# Patient Record
Sex: Female | Born: 1935 | Race: White | Hispanic: No | Smoking: Never smoker
Health system: Southern US, Community
[De-identification: ages and names within clinical notes are randomized; demographics above are authoritative.]

## PROBLEM LIST (undated history)

## (undated) DIAGNOSIS — G301 Alzheimer's disease with late onset: Secondary | ICD-10-CM

## (undated) DIAGNOSIS — K5790 Diverticulosis of intestine, part unspecified, without perforation or abscess without bleeding: Secondary | ICD-10-CM

## (undated) DIAGNOSIS — Z87828 Personal history of other (healed) physical injury and trauma: Secondary | ICD-10-CM

## (undated) DIAGNOSIS — N3281 Overactive bladder: Secondary | ICD-10-CM

## (undated) DIAGNOSIS — I671 Cerebral aneurysm, nonruptured: Secondary | ICD-10-CM

## (undated) DIAGNOSIS — C4401 Basal cell carcinoma of skin of lip: Secondary | ICD-10-CM

## (undated) DIAGNOSIS — E039 Hypothyroidism, unspecified: Secondary | ICD-10-CM

## (undated) DIAGNOSIS — M51369 Other intervertebral disc degeneration, lumbar region without mention of lumbar back pain or lower extremity pain: Secondary | ICD-10-CM

## (undated) DIAGNOSIS — E785 Hyperlipidemia, unspecified: Secondary | ICD-10-CM

## (undated) DIAGNOSIS — E119 Type 2 diabetes mellitus without complications: Secondary | ICD-10-CM

## (undated) DIAGNOSIS — I509 Heart failure, unspecified: Secondary | ICD-10-CM

## (undated) DIAGNOSIS — I499 Cardiac arrhythmia, unspecified: Secondary | ICD-10-CM

## (undated) DIAGNOSIS — J302 Other seasonal allergic rhinitis: Secondary | ICD-10-CM

## (undated) DIAGNOSIS — K219 Gastro-esophageal reflux disease without esophagitis: Secondary | ICD-10-CM

## (undated) DIAGNOSIS — I10 Essential (primary) hypertension: Secondary | ICD-10-CM

## (undated) DIAGNOSIS — G4730 Sleep apnea, unspecified: Secondary | ICD-10-CM

## (undated) DIAGNOSIS — I739 Peripheral vascular disease, unspecified: Secondary | ICD-10-CM

## (undated) DIAGNOSIS — I4892 Unspecified atrial flutter: Secondary | ICD-10-CM

## (undated) DIAGNOSIS — I272 Pulmonary hypertension, unspecified: Secondary | ICD-10-CM

## (undated) DIAGNOSIS — C44519 Basal cell carcinoma of skin of other part of trunk: Secondary | ICD-10-CM

## (undated) DIAGNOSIS — F0281 Dementia in other diseases classified elsewhere with behavioral disturbance: Secondary | ICD-10-CM

## (undated) DIAGNOSIS — M5136 Other intervertebral disc degeneration, lumbar region: Secondary | ICD-10-CM

## (undated) DIAGNOSIS — F02818 Dementia in other diseases classified elsewhere, unspecified severity, with other behavioral disturbance: Secondary | ICD-10-CM

## (undated) DIAGNOSIS — F341 Dysthymic disorder: Secondary | ICD-10-CM

## (undated) HISTORY — DX: Basal cell carcinoma of skin of lip: C44.01

## (undated) HISTORY — DX: Personal history of other (healed) physical injury and trauma: Z87.828

## (undated) HISTORY — DX: Gastro-esophageal reflux disease without esophagitis: K21.9

## (undated) HISTORY — DX: Overactive bladder: N32.81

## (undated) HISTORY — DX: Other intervertebral disc degeneration, lumbar region without mention of lumbar back pain or lower extremity pain: M51.369

## (undated) HISTORY — DX: Hyperlipidemia, unspecified: E78.5

## (undated) HISTORY — DX: Basal cell carcinoma of skin of other part of trunk: C44.519

## (undated) HISTORY — DX: Diverticulosis of intestine, part unspecified, without perforation or abscess without bleeding: K57.90

## (undated) HISTORY — PX: BASAL CELL CARCINOMA EXCISION: SHX1214

## (undated) HISTORY — DX: Other seasonal allergic rhinitis: J30.2

## (undated) HISTORY — DX: Type 2 diabetes mellitus without complications: E11.9

## (undated) HISTORY — DX: Pulmonary hypertension, unspecified: I27.20

## (undated) HISTORY — DX: Essential (primary) hypertension: I10

## (undated) HISTORY — DX: Cerebral aneurysm, nonruptured: I67.1

## (undated) HISTORY — DX: Dysthymic disorder: F34.1

## (undated) HISTORY — DX: Heart failure, unspecified: I50.9

## (undated) HISTORY — DX: Other intervertebral disc degeneration, lumbar region: M51.36

## (undated) HISTORY — DX: Unspecified atrial flutter: I48.92

## (undated) HISTORY — DX: Cardiac arrhythmia, unspecified: I49.9

---
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
## 1984-12-24 HISTORY — PX: TOTAL ABDOMINAL HYSTERECTOMY: SHX209

## 2004-12-24 HISTORY — PX: CATARACT EXTRACTION, BILATERAL: SHX1313

## 2012-12-24 HISTORY — PX: COLONOSCOPY: SHX5424

## 2013-04-29 DIAGNOSIS — I671 Cerebral aneurysm, nonruptured: Secondary | ICD-10-CM | POA: Insufficient documentation

## 2013-12-24 HISTORY — PX: CARDIOVASCULAR STRESS TEST: SHX262

## 2015-05-25 HISTORY — PX: INJECTION KNEE: SHX2446

## 2015-11-01 DIAGNOSIS — E785 Hyperlipidemia, unspecified: Secondary | ICD-10-CM | POA: Insufficient documentation

## 2015-11-01 DIAGNOSIS — E1169 Type 2 diabetes mellitus with other specified complication: Secondary | ICD-10-CM | POA: Insufficient documentation

## 2015-11-01 DIAGNOSIS — E781 Pure hyperglyceridemia: Secondary | ICD-10-CM | POA: Insufficient documentation

## 2015-11-01 DIAGNOSIS — E782 Mixed hyperlipidemia: Secondary | ICD-10-CM | POA: Insufficient documentation

## 2016-09-06 HISTORY — PX: REPLACEMENT TOTAL KNEE: SUR1224

## 2018-01-24 DIAGNOSIS — I4892 Unspecified atrial flutter: Secondary | ICD-10-CM

## 2018-01-24 HISTORY — DX: Unspecified atrial flutter: I48.92

## 2018-06-13 ENCOUNTER — Encounter
Admission: RE | Admit: 2018-06-13 | Discharge: 2018-06-13 | Disposition: A | Discharge status: Home / Self Care | Payer: Self-pay | Source: Ambulatory Visit | Attending: Internal Medicine | Admitting: Internal Medicine

## 2018-06-17 ENCOUNTER — Other Ambulatory Visit
Admission: RE | Admit: 2018-06-17 | Discharge: 2018-06-17 | Disposition: A | Discharge status: Home / Self Care | Payer: Medicare Other | Source: Ambulatory Visit | Attending: Gerontology | Admitting: Gerontology

## 2018-06-17 ENCOUNTER — Other Ambulatory Visit: Payer: Self-pay

## 2018-06-17 DIAGNOSIS — I4892 Unspecified atrial flutter: Secondary | ICD-10-CM | POA: Insufficient documentation

## 2018-06-17 LAB — TSH: TSH: 2.749 u[IU]/mL (ref 0.350–4.500)

## 2018-06-17 LAB — CBC WITH DIFFERENTIAL/PLATELET
BASOS ABS: 0.1 10*3/uL (ref 0–0.1)
BASOS PCT: 1 %
EOS ABS: 0.2 10*3/uL (ref 0–0.7)
EOS PCT: 4 %
HCT: 34.9 % — ABNORMAL LOW (ref 35.0–47.0)
Hemoglobin: 12 g/dL (ref 12.0–16.0)
Lymphocytes Relative: 31 %
Lymphs Abs: 2 10*3/uL (ref 1.0–3.6)
MCH: 31 pg (ref 26.0–34.0)
MCHC: 34.5 g/dL (ref 32.0–36.0)
MCV: 90.1 fL (ref 80.0–100.0)
Monocytes Absolute: 0.6 10*3/uL (ref 0.2–0.9)
Monocytes Relative: 9 %
Neutro Abs: 3.7 10*3/uL (ref 1.4–6.5)
Neutrophils Relative %: 55 %
PLATELETS: 292 10*3/uL (ref 150–440)
RBC: 3.87 MIL/uL (ref 3.80–5.20)
RDW: 14.9 % — AB (ref 11.5–14.5)
WBC: 6.6 10*3/uL (ref 3.6–11.0)

## 2018-06-17 LAB — LIPID PANEL
CHOL/HDL RATIO: 2.8 ratio
CHOLESTEROL: 103 mg/dL (ref 0–200)
HDL: 37 mg/dL — ABNORMAL LOW (ref >40)
LDL CALC: 45 mg/dL (ref 0–99)
TRIGLYCERIDES: 107 mg/dL (ref <150)
VLDL: 21 mg/dL (ref 0–40)

## 2018-06-17 LAB — COMPREHENSIVE METABOLIC PANEL
ALT: 16 U/L (ref 0–44)
AST: 17 U/L (ref 15–41)
Albumin: 3.6 g/dL (ref 3.5–5.0)
Alkaline Phosphatase: 63 U/L (ref 38–126)
Anion gap: 7 (ref 5–15)
BUN: 9 mg/dL (ref 8–23)
CHLORIDE: 109 mmol/L (ref 98–111)
CO2: 24 mmol/L (ref 22–32)
Calcium: 8.8 mg/dL — ABNORMAL LOW (ref 8.9–10.3)
Creatinine, Ser: 0.58 mg/dL (ref 0.44–1.00)
GFR calc Af Amer: >60 mL/min (ref >60)
Glucose, Bld: 102 mg/dL — ABNORMAL HIGH (ref 70–99)
POTASSIUM: 3.8 mmol/L (ref 3.5–5.1)
SODIUM: 140 mmol/L (ref 135–145)
Total Bilirubin: 0.6 mg/dL (ref 0.3–1.2)
Total Protein: 6.3 g/dL — ABNORMAL LOW (ref 6.5–8.1)

## 2018-06-17 LAB — DIGOXIN LEVEL: Digoxin Level: 0.6 ng/mL — ABNORMAL LOW (ref 0.8–2.0)

## 2018-06-17 LAB — VITAMIN B12: Vitamin B-12: 414 pg/mL (ref 180–914)

## 2018-06-17 LAB — MAGNESIUM: Magnesium: 1.8 mg/dL (ref 1.7–2.4)

## 2018-06-17 MED ORDER — LORAZEPAM 0.5 MG PO TABS: 0.5000 mg | ORAL_TABLET | ORAL | 0 refills | Status: DC | PRN

## 2018-06-17 NOTE — Telephone Encounter (Signed)
Rx sent to [facility] phone : [phone] , fax : [phone]  

## 2018-06-18 DIAGNOSIS — M150 Primary generalized (osteo)arthritis: Secondary | ICD-10-CM

## 2018-06-18 DIAGNOSIS — M159 Polyosteoarthritis, unspecified: Secondary | ICD-10-CM | POA: Insufficient documentation

## 2018-06-18 DIAGNOSIS — F325 Major depressive disorder, single episode, in full remission: Secondary | ICD-10-CM | POA: Insufficient documentation

## 2018-06-18 LAB — VITAMIN D 25 HYDROXY (VIT D DEFICIENCY, FRACTURES): VIT D 25 HYDROXY: 36 ng/mL (ref 30.0–100.0)

## 2018-06-19 ENCOUNTER — Non-Acute Institutional Stay (SKILLED_NURSING_FACILITY): Payer: Medicare Other | Admitting: Gerontology

## 2018-06-19 ENCOUNTER — Encounter: Payer: Self-pay | Admitting: Gerontology

## 2018-06-19 DIAGNOSIS — I480 Paroxysmal atrial fibrillation: Secondary | ICD-10-CM | POA: Diagnosis not present

## 2018-06-19 DIAGNOSIS — F419 Anxiety disorder, unspecified: Secondary | ICD-10-CM | POA: Diagnosis not present

## 2018-06-19 DIAGNOSIS — E538 Deficiency of other specified B group vitamins: Secondary | ICD-10-CM | POA: Diagnosis not present

## 2018-06-19 DIAGNOSIS — I10 Essential (primary) hypertension: Secondary | ICD-10-CM

## 2018-06-19 DIAGNOSIS — E1140 Type 2 diabetes mellitus with diabetic neuropathy, unspecified: Secondary | ICD-10-CM | POA: Insufficient documentation

## 2018-06-19 DIAGNOSIS — M25569 Pain in unspecified knee: Secondary | ICD-10-CM | POA: Insufficient documentation

## 2018-06-19 DIAGNOSIS — R262 Difficulty in walking, not elsewhere classified: Secondary | ICD-10-CM

## 2018-06-19 DIAGNOSIS — I4892 Unspecified atrial flutter: Secondary | ICD-10-CM | POA: Diagnosis not present

## 2018-06-19 DIAGNOSIS — E7800 Pure hypercholesterolemia, unspecified: Secondary | ICD-10-CM | POA: Diagnosis not present

## 2018-06-19 DIAGNOSIS — F320 Major depressive disorder, single episode, mild: Secondary | ICD-10-CM | POA: Diagnosis not present

## 2018-06-19 DIAGNOSIS — E039 Hypothyroidism, unspecified: Secondary | ICD-10-CM

## 2018-06-19 DIAGNOSIS — G4700 Insomnia, unspecified: Secondary | ICD-10-CM | POA: Diagnosis not present

## 2018-06-19 DIAGNOSIS — M1711 Unilateral primary osteoarthritis, right knee: Secondary | ICD-10-CM

## 2018-06-19 DIAGNOSIS — N3281 Overactive bladder: Secondary | ICD-10-CM | POA: Insufficient documentation

## 2018-06-19 DIAGNOSIS — E1159 Type 2 diabetes mellitus with other circulatory complications: Secondary | ICD-10-CM | POA: Insufficient documentation

## 2018-06-19 DIAGNOSIS — E46 Unspecified protein-calorie malnutrition: Secondary | ICD-10-CM | POA: Insufficient documentation

## 2018-06-19 DIAGNOSIS — M6281 Muscle weakness (generalized): Secondary | ICD-10-CM

## 2018-06-19 DIAGNOSIS — G3184 Mild cognitive impairment, so stated: Secondary | ICD-10-CM | POA: Insufficient documentation

## 2018-06-19 NOTE — Progress Notes (Signed)
Location:   [facility] Room Number: 240X Place of Service:  SNF [phone])  Provider: Toni Arthurs, NP-C  PCP: No primary care provider on file. No care team member to display  No emergency contact information on file.  Code Status: DNR Goals of care:  Advanced Directive information Advanced Directives 06/19/2018  Does Patient Have a Medical Advance Directive? Yes  Type of Advance Directive Out of facility DNR (pink MOST or yellow form);Healthcare Power of Attorney  Does patient want to make changes to medical advance directive? No - Patient declined  Copy of [place] in Chart? No - copy requested     Allergies  Allergen Reactions  . Oxytocin     Chief Complaint  Patient presents with  . Discharge Note    Discharged from SNF    HPI:  84 y.o. female seen today for discharge evaluation. Pt was only admitted to the facility 4 days ago. Pt just moved to [place] from [facility] and is still in the process of moving into the [facility]. Pt admits to not sleeping well and worrying about her husband who is also a resident here on the SN unit. She has been trying to pack/move, etc. And is under a significant amount of stress and strain. Pt developed aflutter with RVR and went to the ED in [place]. Symptoms were managed there and was advised to follow up with PCP. Pt is also having muscle weakness and difficulty walking. She has a known right knee effusion, causing significant amount of pain. She has not yet seen an orthopedist about this. She was seen by Dr Ouida Sills yesterday and started on Voltaren Gel until she can be seen by an Orthopedist, with prn Tylenol for pain. She reports an ongoing/ persistent history of anxiety and depression, that has been exacerbated by the recent move and placement of her husband in [place]. Baseline labs were obtained. She was found to be low in B12 and low cholesterol. Adjustments made to statin therapy. Supplementation  started for B12. She was also mildly deficient in protein and albumin. Supplementation started for this, as well. Pt is already on appropriate treatment for cognitive impairment, hypothyroidism, OAB, HTN, DM with neuropathy. Pt stated she did not check her BGs at home and did not have a meter. She did request a prescription for a meter. She was admitted on Eliquis for the a-fib. Effective adjustments made to her medication regimen for anxiety, depression, hypercholesterolemia, aflutter/ persistent tachycardia (noted to be worse in the AM). Pt was educated at length about these changes and the importance of establishing a PCP, Cardiologist and Orthopedist. Pt verbalized understanding and agreement with plan. We discussed home monitoring of blood glucose and the need to potentially adjust the Metformin if BGs continued to run high. Adjustments to the Metformin were not made at this time d/t possible effect of the stress on her BGs- making them more elevated than normal. Pt verbalized the importance of monitoring BGs and close f/u with PCP for medication adjustment of Metformin, etc. In general, pt reports she is feeling better and is ready to discharge home to her apartment. Pt expressed her appreciation for the care and medication adjustments, etc. She reports her appetite is starting to improve, voiding well  Having regular BMs. VSS. No other complaints.     Past Medical History:  Diagnosis Date  . Arrhythmia   . Atrial flutter ([place]) 01/2018   new onset   . Basal cell carcinoma  of back   . Basal cell carcinoma of lip   . Cerebral aneurysm    followed by [facility]  . DDD (degenerative disc disease), lumbar    superior plate depression, L3 08/18/2014  . Diabetes mellitus type II, controlled ([place])   . Diverticulosis   . Dysthymia    depression  . GERD (gastroesophageal reflux disease)   . History of meniscal tear   . Hyperlipidemia   . Hypertension   . Mild pulmonary hypertension ([place])   . Overactive  bladder   . Seasonal allergic rhinitis     Past Surgical History:  Procedure Laterality Date  . APPENDECTOMY  1946  . BASAL CELL CARCINOMA EXCISION     2006 and 2009 removed from back and lip  . CARDIOVASCULAR STRESS TEST  2015   nuclear cardiac stress test negative for ischemia - Dr. Satira Sark  . CATARACT EXTRACTION, BILATERAL  2006  . COLONOSCOPY  2014  . [place]  . INJECTION KNEE Left 05/2015  . REPLACEMENT TOTAL KNEE Left 09/06/2016   Dr. Barnet Pall  . TONSILLECTOMY AND ADENOIDECTOMY  1953  . TOTAL ABDOMINAL HYSTERECTOMY  1986   [facility]      reports that she has never smoked. She has never used smokeless tobacco. She reports that she drank alcohol. Her drug history is not on file. Social History   Socioeconomic History  . Marital status: Married    Spouse name: Not on file  . Number of children: 5  . Years of education: Not on file  . Highest education level: Not on file  Occupational History  . Not on file  Social Needs  . Financial resource strain: Not on file  . Food insecurity:    Worry: Not on file    Inability: Not on file  . Transportation needs:    Medical: Not on file    Non-medical: Not on file  Tobacco Use  . Smoking status: Never Smoker  . Smokeless tobacco: Never Used  Substance and Sexual Activity  . Alcohol use: Not Currently  . Drug use: Not on file  . Sexual activity: Not on file  Lifestyle  . Physical activity:    Days per week: Not on file    Minutes per session: Not on file  . Stress: Not on file  Relationships  . Social connections:    Talks on phone: Not on file    Gets together: Not on file    Attends religious service: Not on file    Active member of club or organization: Not on file    Attends meetings of clubs or organizations: Not on file    Relationship status: Not on file  . Intimate partner violence:    Fear of current or ex partner: Not on file    Emotionally abused: Not on file    Physically abused: Not  on file    Forced sexual activity: Not on file  Other Topics Concern  . Not on file  Social History Narrative  . Not on file   Functional Status Survey:    Allergies  Allergen Reactions  . Oxytocin     Pertinent  Health Maintenance Due  Topic Date Due  . DEXA SCAN  03/01/2000  . PNA vac Low Risk Adult (1 of 2 - PCV13) 03/01/2000  . INFLUENZA VACCINE  07/24/2018    Medications: Allergies as of 06/19/2018      Reactions   Oxytocin  Medication List        Accurate as of 06/19/18  3:57 PM. Always use your most recent med list.          acetaminophen 500 MG tablet Commonly known as:  TYLENOL Take 500 mg by mouth every 6 (six) hours as needed.   apixaban 5 MG Tabs tablet Commonly known as:  ELIQUIS Take 5 mg by mouth 2 (two) times daily.   atorvastatin 10 MG tablet Commonly known as:  LIPITOR Take 10 mg by mouth daily.   cholecalciferol 1000 units tablet Commonly known as:  VITAMIN D Take 1,000 Units by mouth daily.   cyanocobalamin 1000 MCG tablet Take 1,000 mcg by mouth daily.   Diclofenac Sodium 3 % Gel Place 4 g onto the skin 3 (three) times daily. Right knee   digoxin 0.125 MG tablet Commonly known as:  LANOXIN Take 0.125 mg by mouth daily. for heart rhythm issues and heart failure **call MD or NP if heart rate is less than 60**   diltiazem 180 MG 24 hr tablet Commonly known as:  CARDIZEM LA Take 180 mg by mouth daily.   donepezil 10 MG tablet Commonly known as:  ARICEPT Take 10 mg by mouth at bedtime.   feeding supplement (PRO-STAT SUGAR FREE 64) Liqd Take 30 mLs by mouth daily.   gabapentin 100 MG capsule Commonly known as:  NEURONTIN Take 100 mg by mouth at bedtime.   levothyroxine 50 MCG tablet Commonly known as:  SYNTHROID, LEVOTHROID Take 50 mcg by mouth daily before breakfast.   LORazepam 0.5 MG tablet Commonly known as:  ATIVAN Take 0.5 mg by mouth at bedtime.   LORazepam 0.5 MG tablet Commonly known as:  ATIVAN Take 1  tablet (0.5 mg total) by mouth every 4 (four) hours as needed for anxiety or sleep.   losartan 50 MG tablet Commonly known as:  COZAAR Take 50 mg by mouth daily.   metFORMIN 500 MG tablet Commonly known as:  GLUCOPHAGE Take 500 mg by mouth 2 (two) times daily with a meal.   metoprolol succinate 100 MG 24 hr tablet Commonly known as:  TOPROL-XL Take 150 mg by mouth daily. 1 and 1/2 tabs Take with or immediately following a meal.   metoprolol tartrate 25 MG tablet Commonly known as:  LOPRESSOR Take 25 mg by mouth every 8 (eight) hours as needed. tachycardia- heart rate >110   oxybutynin 5 MG tablet Commonly known as:  DITROPAN Take 5 mg by mouth 2 (two) times daily.   potassium chloride 10 MEQ tablet Commonly known as:  K-DUR Take 10 mEq by mouth daily.   ranitidine 150 MG tablet Commonly known as:  ZANTAC Take 150 mg by mouth 2 (two) times daily.   valACYclovir 1000 MG tablet Commonly known as:  VALTREX Take 1,000 mg by mouth 2 (two) times daily as needed.   venlafaxine XR 150 MG 24 hr capsule Commonly known as:  EFFEXOR-XR Take 150 mg by mouth daily with breakfast.       Review of Systems  Constitutional: Positive for fatigue. Negative for activity change, appetite change, chills, diaphoresis and fever.  HENT: Negative for congestion, mouth sores, nosebleeds, postnasal drip, sneezing, sore throat, trouble swallowing and voice change.   Respiratory: Negative for apnea, cough, choking, chest tightness, shortness of breath and wheezing.   Cardiovascular: Negative for chest pain, palpitations and leg swelling.  Gastrointestinal: Negative for abdominal distention, abdominal pain, constipation, diarrhea and nausea.  Genitourinary: Negative for difficulty urinating, dysuria,  frequency and urgency.  Musculoskeletal: Positive for gait problem. Negative for back pain and myalgias. Arthralgias: typical arthritis. Joint swelling: ambulates with walker.  Skin: Negative for color  change, pallor, rash and wound.  Neurological: Positive for weakness. Negative for dizziness, tremors, syncope, speech difficulty, numbness and headaches.  Psychiatric/Behavioral: Negative for agitation and behavioral problems. The patient is nervous/anxious.   All other systems reviewed and are negative.   Vitals:   06/19/18 1454  BP: (!) 97/59  Pulse: 93  Resp: 18  Temp: (!) 97.3 F (36.3 C)  TempSrc: Oral  SpO2: 95%  Weight: 152 lb (68.9 kg)  Height: 5\' 5"  (1.651 m)   Body mass index is 25.29 kg/m. Physical Exam  Constitutional: She is oriented to person, place, and time. Vital signs are normal. She appears well-developed and well-nourished. She is active and cooperative. She does not appear ill. No distress.  HENT:  Head: Normocephalic and atraumatic.  Mouth/Throat: Uvula is midline, oropharynx is clear and moist and mucous membranes are normal. Mucous membranes are not pale, not dry and not cyanotic.  Eyes: Pupils are equal, round, and reactive to light. Conjunctivae, EOM and lids are normal.  Neck: Trachea normal, normal range of motion and full passive range of motion without pain. Neck supple. No JVD present. No tracheal deviation, no edema and no erythema present. No thyromegaly present.  Cardiovascular: Normal rate, normal heart sounds, intact distal pulses and normal pulses. An irregular rhythm present. Exam reveals no gallop, no distant heart sounds and no friction rub.  No murmur heard. Pulses:      Dorsalis pedis pulses are 2+ on the right side, and 2+ on the left side.  No edema  Pulmonary/Chest: Effort normal and breath sounds normal. No accessory muscle usage. No respiratory distress. She has no decreased breath sounds. She has no wheezes. She has no rhonchi. She has no rales. She exhibits no tenderness.  Abdominal: Soft. Normal appearance and bowel sounds are normal. She exhibits no distension and no ascites. There is no tenderness.  Musculoskeletal: Normal range  of motion. She exhibits no edema or tenderness.  Expected osteoarthritis, stiffness; Bilateral Calves soft, supple. Negative Homan's Sign. B- pedal pulses equal; generalized weakness and deconditioning  Neurological: She is alert and oriented to person, place, and time. She has normal strength.  Skin: Skin is warm, dry and intact. She is not diaphoretic. No cyanosis. No pallor. Nails show no clubbing.  Psychiatric: She has a normal mood and affect. Her speech is normal and behavior is normal. Judgment and thought content normal. Cognition and memory are normal.  Nursing note and vitals reviewed.   Labs reviewed: Basic Metabolic Panel: Recent Labs    06/17/18 0600  NA 140  K 3.8  CL 109  CO2 24  GLUCOSE 102*  BUN 9  CREATININE 0.58  CALCIUM 8.8*  MG 1.8   Liver Function Tests: Recent Labs    06/17/18 0600  AST 17  ALT 16  ALKPHOS 63  BILITOT 0.6  PROT 6.3*  ALBUMIN 3.6   No results for input(s): LIPASE, AMYLASE in the last 8760 hours. No results for input(s): AMMONIA in the last 8760 hours. CBC: Recent Labs    06/17/18 0600  WBC 6.6  NEUTROABS 3.7  HGB 12.0  HCT 34.9*  MCV 90.1  PLT 292   Cardiac Enzymes: No results for input(s): CKTOTAL, CKMB, CKMBINDEX, TROPONINI in the last 8760 hours. BNP: Invalid input(s): POCBNP CBG: No results for input(s): GLUCAP in the last  8760 hours.  Procedures and Imaging Studies During Stay: No results found.  Assessment/Plan:    Atrial flutter, unspecified type (HCC)  Paroxysmal atrial fibrillation (HCC)  Depression, major, single episode, mild (HCC)  Anxiety disorder, unspecified type  Insomnia, unspecified type  Pure hypercholesterolemia  Vitamin B12 deficiency  Protein-calorie malnutrition, unspecified severity ([place])  Type 2 diabetes mellitus with diabetic neuropathy, without long-term current use of insulin (HCC)  Cognitive impairment, mild, so stated  Osteoarthritis of right knee, unspecified  osteoarthritis type  Benign essential hypertension  Hypothyroidism, unspecified type  Overactive bladder  Generalized muscle weakness  Difficulty in walking, not elsewhere classified   Continue working with PT/OT  Continue exercises as taught by PT/OT  Continue current medication regimen, except  DC previous dose of Atorvastatin  Now Atorvastatin 10 mg po Q Day  Pro-stat 30 mL po Q Day (or equivalent protein powder/ supplement)  Continue Eliquis 5 mg po BID  Add Cyanocobalamin 1,000 mcg po Q Day  Continue Diclofenac 1% gel- apply to the right knee TID for pain  Follow up with Orthopedist as soon as possible  Continue Cardizem LA 180 mg po Q Day  Continue lanoxin 0.125 mg po Q Day  Continue Aricpet 10 mg po Q HS  Continue Neurontin 100 mg po QHS  Continue Synthroid 50 mcg po Q Day  DC Trizolam  Add Ativan 0.5 mg po Q HS for sleep and Q 6 hours prn anxiety  DC current Effexor regimen  Effexor XR 150 mg po Q Day  Continue Cozaar 50 mg po Q Day  Continue Metformin 500 mg po Q Day  DC Mobic  DX Metoprolol XR 100 mg po Q Day  Change to Metoprolol XR 100 mg tabs 1 1/2 tablets po Q HS (d/t early am tachycardia)  Metoprolol tartrate 25 g po TID prn HR >110  Continue oxybutynin 5 mg po BID  Continue Xantac 150 mg po BID  RX written for CBG meter  Establish and f/u with PCP asap  Establish and f/u with Cardiology asap  Establish and f/u with Orthopedist asap  Monitor for bleeding, abdominal pain, etc   Patient is being discharged with the following home health services: HHPT/OT [facility]   Patient is being discharged with the following durable medical equipment: none- already has walker and rollator   Patient has been advised to f/u with their PCP in 1-2 weeks to bring them up to date on their rehab stay.  Social services at facility was responsible for arranging this appointment.  Pt was provided with a 30 day supply of  prescriptions for medications and refills must be obtained from their PCP.  For controlled substances, a more limited supply may be provided adequate until PCP appointment only.  Future labs/tests needed:   Family/ staff Communication:   Total Time:  Documentation: 15 minutes  Face to Face: 30 minutes  Family/Phone:  Vikki Ports, NP-C [facility] [address], [place] [address] Cell Phone (Mon-Fri 8am-5pm):  [phone] On Call:  [phone] & follow prompts after 5pm & weekends Office Phone:  [phone] Office Fax:  [phone]

## 2018-06-23 ENCOUNTER — Encounter: Admission: RE | Admit: 2018-06-23 | Payer: Self-pay | Source: Ambulatory Visit | Admitting: Internal Medicine

## 2018-08-06 ENCOUNTER — Telehealth: Payer: Self-pay | Admitting: Cardiovascular Disease

## 2018-08-06 NOTE — Telephone Encounter (Signed)
Patient stated that she would like to cancel her appointment on 08/18/18 with Dr. Rockey Situ as a new patient. She stated that she will be seeing another cardiologist tomorrow. Appointment has been cancelled.

## 2018-08-06 NOTE — Telephone Encounter (Signed)
New Message  Pt verbalized someone called her from this office but I do not see any notes.  Please f/u

## 2018-08-18 ENCOUNTER — Ambulatory Visit: Payer: Self-pay | Admitting: Cardiovascular Disease

## 2018-08-18 ENCOUNTER — Encounter

## 2018-09-08 DIAGNOSIS — E1169 Type 2 diabetes mellitus with other specified complication: Secondary | ICD-10-CM | POA: Insufficient documentation

## 2018-09-08 DIAGNOSIS — G609 Hereditary and idiopathic neuropathy, unspecified: Secondary | ICD-10-CM | POA: Insufficient documentation

## 2018-09-08 DIAGNOSIS — E782 Mixed hyperlipidemia: Secondary | ICD-10-CM | POA: Insufficient documentation

## 2018-09-09 ENCOUNTER — Encounter: Payer: Self-pay | Admitting: Emergency Medicine

## 2018-09-09 ENCOUNTER — Other Ambulatory Visit: Payer: Self-pay

## 2018-09-09 ENCOUNTER — Emergency Department
Admission: EM | Admit: 2018-09-09 | Discharge: 2018-09-10 | Disposition: A | Discharge status: Home / Self Care | Payer: Medicare Other | Attending: Emergency Medicine | Admitting: Emergency Medicine

## 2018-09-09 DIAGNOSIS — G4700 Insomnia, unspecified: Secondary | ICD-10-CM | POA: Diagnosis not present

## 2018-09-09 DIAGNOSIS — E1140 Type 2 diabetes mellitus with diabetic neuropathy, unspecified: Secondary | ICD-10-CM | POA: Diagnosis not present

## 2018-09-09 DIAGNOSIS — I10 Essential (primary) hypertension: Secondary | ICD-10-CM | POA: Insufficient documentation

## 2018-09-09 DIAGNOSIS — R51 Headache: Secondary | ICD-10-CM | POA: Insufficient documentation

## 2018-09-09 DIAGNOSIS — Z7984 Long term (current) use of oral hypoglycemic drugs: Secondary | ICD-10-CM | POA: Insufficient documentation

## 2018-09-09 DIAGNOSIS — Z96652 Presence of left artificial knee joint: Secondary | ICD-10-CM | POA: Insufficient documentation

## 2018-09-09 DIAGNOSIS — Z79899 Other long term (current) drug therapy: Secondary | ICD-10-CM | POA: Diagnosis not present

## 2018-09-09 DIAGNOSIS — I480 Paroxysmal atrial fibrillation: Secondary | ICD-10-CM | POA: Insufficient documentation

## 2018-09-09 DIAGNOSIS — R112 Nausea with vomiting, unspecified: Secondary | ICD-10-CM | POA: Diagnosis not present

## 2018-09-09 DIAGNOSIS — Z85828 Personal history of other malignant neoplasm of skin: Secondary | ICD-10-CM | POA: Diagnosis not present

## 2018-09-09 DIAGNOSIS — R251 Tremor, unspecified: Secondary | ICD-10-CM | POA: Insufficient documentation

## 2018-09-09 DIAGNOSIS — E039 Hypothyroidism, unspecified: Secondary | ICD-10-CM | POA: Insufficient documentation

## 2018-09-09 DIAGNOSIS — R197 Diarrhea, unspecified: Secondary | ICD-10-CM

## 2018-09-09 DIAGNOSIS — R519 Headache, unspecified: Secondary | ICD-10-CM

## 2018-09-09 LAB — BASIC METABOLIC PANEL
Anion gap: 10 (ref 5–15)
BUN: 13 mg/dL (ref 8–23)
CALCIUM: 8.8 mg/dL — AB (ref 8.9–10.3)
CO2: 26 mmol/L (ref 22–32)
CREATININE: 0.69 mg/dL (ref 0.44–1.00)
Chloride: 99 mmol/L (ref 98–111)
Glucose, Bld: 170 mg/dL — ABNORMAL HIGH (ref 70–99)
Potassium: 4.6 mmol/L (ref 3.5–5.1)
SODIUM: 135 mmol/L (ref 135–145)

## 2018-09-09 LAB — CBC
HCT: 45.7 % (ref 35.0–47.0)
Hemoglobin: 16.1 g/dL — ABNORMAL HIGH (ref 12.0–16.0)
MCH: 30.7 pg (ref 26.0–34.0)
MCHC: 35.2 g/dL (ref 32.0–36.0)
MCV: 87.4 fL (ref 80.0–100.0)
PLATELETS: 243 10*3/uL (ref 150–440)
RBC: 5.23 MIL/uL — AB (ref 3.80–5.20)
RDW: 15.9 % — ABNORMAL HIGH (ref 11.5–14.5)
WBC: 9.4 10*3/uL (ref 3.6–11.0)

## 2018-09-09 MED ORDER — PROCHLORPERAZINE EDISYLATE 10 MG/2ML IJ SOLN
10.0000 mg | Freq: Once | INTRAMUSCULAR | Status: AC
  Administered 2018-09-09: 10 mg via INTRAVENOUS
  Filled 2018-09-09: qty 2

## 2018-09-09 MED ORDER — SODIUM CHLORIDE 0.9 % IV BOLUS
1000.0000 mL | Freq: Once | INTRAVENOUS | Status: AC
  Administered 2018-09-09: 1000 mL via INTRAVENOUS

## 2018-09-09 MED ORDER — DIPHENHYDRAMINE HCL 12.5 MG/5ML PO ELIX: 6.2500 mg | ORAL_SOLUTION | Freq: Once | ORAL | Status: DC

## 2018-09-09 MED ORDER — DIPHENHYDRAMINE HCL 50 MG/ML IJ SOLN
INTRAMUSCULAR | Status: AC
  Administered 2018-09-09: 12.5 mg
  Filled 2018-09-09: qty 1

## 2018-09-09 NOTE — ED Provider Notes (Signed)
[facility] Emergency Department Provider Note  ____________________________________________   I have reviewed the triage vital signs and the nursing notes. Where available I have reviewed prior notes and, if possible and indicated, outside hospital notes.    HISTORY  Chief Complaint Nausea; Emesis; Diarrhea; Dizziness; and Tremors    HPI Alison Richardson is a 84 y.o. female  With a history of aneurysm for she gets CT scans and other imaging every year, migraine headaches which are somewhat frequent, paroxysmal atrial fibrillation, poor sleep, anxiety, tremor, states that since he moved up here several months ago to be with her husband in a higher level of care she feels that her life is gone downhill gradually.  She has had tremors that come and go.  She is not having a tremor today.  She has had headaches that come and go, she is having a mild headache today.  Not sudden onset not worst headache of life, consistent with prior migraine.  Patient states that she is taken nothing for it.  She also states she has had some diarrhea over the last couple days nonbloody, no melena no bright red blood per rectum no hematemesis.  She states she feels generally unwell and anxious.  She is having trouble sleeping.  She saw a primary care doctor for most of these complaints yesterday and none of these complaints are new.  Patient is never been evaluated for sleep apnea.  She has been having daily takes for months she states gradually onset.  Come and go.  Not reliably there every day but sometimes they are and sometimes they are not.   Past Medical History:  Diagnosis Date  . Arrhythmia   . Atrial flutter ([place]) 01/2018   new onset   . Basal cell carcinoma of back   . Basal cell carcinoma of lip   . Cerebral aneurysm    followed by [facility]  . DDD (degenerative disc disease), lumbar    superior plate depression, L3 08/18/2014  . Diabetes mellitus type II, controlled ([place])   .  Diverticulosis   . Dysthymia    depression  . GERD (gastroesophageal reflux disease)   . History of meniscal tear   . Hyperlipidemia   . Hypertension   . Mild pulmonary hypertension ([place])   . Overactive bladder   . Seasonal allergic rhinitis     Patient Active Problem List   Diagnosis Date Noted  . Anxiety disorder 06/19/2018  . Depression, major, single episode, mild ([place]) 06/19/2018  . Paroxysmal atrial fibrillation ([place]) 06/19/2018  . Atrial flutter ([place]) 06/19/2018  . Pure hypercholesterolemia 06/19/2018  . Vitamin B12 deficiency 06/19/2018  . Insomnia 06/19/2018  . Protein-calorie malnutrition ([place]) 06/19/2018  . Type 2 diabetes mellitus with diabetic neuropathy ([place]) 06/19/2018  . Knee pain 06/19/2018  . Cognitive impairment, mild, so stated 06/19/2018  . Osteoarthritis of right knee 06/19/2018  . Benign essential hypertension 06/19/2018  . Hypothyroidism 06/19/2018  . Overactive bladder 06/19/2018  . Generalized muscle weakness 06/19/2018  . Difficulty in walking, not elsewhere classified 06/19/2018    Past Surgical History:  Procedure Laterality Date  . APPENDECTOMY  1946  . BASAL CELL CARCINOMA EXCISION     2006 and 2009 removed from back and lip  . CARDIOVASCULAR STRESS TEST  2015   nuclear cardiac stress test negative for ischemia - Dr. Satira Sark  . CATARACT EXTRACTION, BILATERAL  2006  . COLONOSCOPY  2014  . [place]  . INJECTION KNEE Left  05/2015  . REPLACEMENT TOTAL KNEE Left 09/06/2016   Dr. Barnet Pall  . TONSILLECTOMY AND ADENOIDECTOMY  1953  . TOTAL ABDOMINAL HYSTERECTOMY  1986   [facility]    Prior to Admission medications   Medication Sig Start Date End Date Taking? Authorizing Provider  acetaminophen (TYLENOL) 500 MG tablet Take 500 mg by mouth every 6 (six) hours as needed.    [provider]  Amino Acids-Protein Hydrolys (FEEDING SUPPLEMENT, PRO-STAT SUGAR FREE 64,) LIQD Take 30 mLs by mouth daily.    [provider]  apixaban (ELIQUIS) 5 MG TABS tablet Take 5 mg by mouth 2 (two) times daily.    [provider]  atorvastatin (LIPITOR) 10 MG tablet Take 10 mg by mouth daily.    [provider]  cholecalciferol (VITAMIN D) 1000 units tablet Take 1,000 Units by mouth daily.    [provider]  cyanocobalamin 1000 MCG tablet Take 1,000 mcg by mouth daily.    [provider]  Diclofenac Sodium 3 % GEL Place 4 g onto the skin 3 (three) times daily. Right knee    [provider]  digoxin (LANOXIN) 0.125 MG tablet Take 0.125 mg by mouth daily. for heart rhythm issues and heart failure **call MD or NP if heart rate is less than 60**    [provider]  diltiazem (CARDIZEM LA) 180 MG 24 hr tablet Take 180 mg by mouth daily.    [provider]  donepezil (ARICEPT) 10 MG tablet Take 10 mg by mouth at bedtime.    [provider]  gabapentin (NEURONTIN) 100 MG capsule Take 100 mg by mouth at bedtime.    [provider]  levothyroxine (SYNTHROID, LEVOTHROID) 50 MCG tablet Take 50 mcg by mouth daily before breakfast.    [provider]  LORazepam (ATIVAN) 0.5 MG tablet Take 0.5 mg by mouth at bedtime.    [provider]  LORazepam (ATIVAN) 0.5 MG tablet Take 1 tablet (0.5 mg total) by mouth every 4 (four) hours as needed for anxiety or sleep. 06/17/18   Toni Arthurs, NP  losartan (COZAAR) 50 MG tablet Take 50 mg by mouth daily.    [provider]  metFORMIN (GLUCOPHAGE) 500 MG tablet Take 500 mg by mouth 2 (two) times daily with a meal.    [provider]  metoprolol succinate (TOPROL-XL) 100 MG 24 hr tablet Take 150 mg by mouth daily. 1 and 1/2 tabs Take with or immediately following a meal.    [provider]  metoprolol tartrate (LOPRESSOR) 25 MG tablet Take 25 mg by mouth every 8 (eight) hours as needed. tachycardia- heart rate >110    [provider]  oxybutynin (DITROPAN)  5 MG tablet Take 5 mg by mouth 2 (two) times daily.    [provider]  potassium chloride (K-DUR) 10 MEQ tablet Take 10 mEq by mouth daily.    [provider]  ranitidine (ZANTAC) 150 MG tablet Take 150 mg by mouth 2 (two) times daily.    [provider]  valACYclovir (VALTREX) 1000 MG tablet Take 1,000 mg by mouth 2 (two) times daily as needed.    [provider]  venlafaxine XR (EFFEXOR-XR) 150 MG 24 hr capsule Take 150 mg by mouth daily with breakfast.    [provider]    Allergies Oxytocin  Family History  Problem Relation Age of Onset  . Hypertension Mother   . CAD Father     Social History Social  History   Tobacco Use  . Smoking status: Never Smoker  . Smokeless tobacco: Never Used  Substance Use Topics  . Alcohol use: Not Currently  . Drug use: Not on file    Review of Systems Constitutional: No fever/chills Eyes: No visual changes. ENT: No sore throat. No stiff neck no neck pain Cardiovascular: Denies chest pain. Respiratory: Denies shortness of breath. Gastrointestinal:   no vomiting.  No diarrhea.  No constipation. Genitourinary: Negative for dysuria. Musculoskeletal: Negative lower extremity swelling Skin: Negative for rash. Neurological: Negative for severe headaches, focal weakness or numbness.   ____________________________________________   PHYSICAL EXAM:  VITAL SIGNS: ED Triage Vitals [09/09/18 2206]  Enc Vitals Group     BP (!) 179/87     Pulse Rate 60     Resp 18     Temp 98.2 F (36.8 C)     Temp Source Oral     SpO2 97 %     Weight 144 lb (65.3 kg)     Height 5\' 6"  (1.676 m)     Head Circumference      Peak Flow      Pain Score 6     Pain Loc      Pain Edu?      Excl. in [place]?     Constitutional: Alert and oriented. Well appearing and in no acute distress.  Laughing and joking with me, she is anxious however Eyes: Conjunctivae are normal Head: Atraumatic HEENT: No  congestion/rhinnorhea. Mucous membranes are moist.  Oropharynx non-erythematous Neck:   Nontender with no meningismus, no masses, no stridor Cardiovascular: Normal rate, regular rhythm. Grossly normal heart sounds.  Good peripheral circulation. Respiratory: Normal respiratory effort.  No retractions. Lungs CTAB. Abdominal: Soft and nontender. No distention. No guarding no rebound Back:  There is no focal tenderness or step off.  there is no midline tenderness there are no lesions noted. there is no CVA tenderness Musculoskeletal: No lower extremity tenderness, no upper extremity tenderness. No joint effusions, no DVT signs strong distal pulses no edema Neurologic:  Normal speech and language. No gross focal neurologic deficits are appreciated.  Skin:  Skin is warm, dry and intact. No rash noted. Psychiatric: Mood and affect are normal. Speech and behavior are normal.  ____________________________________________   LABS (all labs ordered are listed, but only abnormal results are displayed)  Labs Reviewed  BASIC METABOLIC PANEL - Abnormal; Notable for the following components:      Result Value   Glucose, Bld 170 (*)    Calcium 8.8 (*)    All other components within normal limits  CBC - Abnormal; Notable for the following components:   RBC 5.23 (*)    Hemoglobin 16.1 (*)    RDW 15.9 (*)    All other components within normal limits  URINALYSIS, COMPLETE (UACMP) WITH MICROSCOPIC  CBG MONITORING, ED    Pertinent labs  results that were available during my care of the patient were reviewed by me and considered in my medical decision making (see chart for details). ____________________________________________  EKG  I personally interpreted any EKGs ordered by me or triage Likely sinus rhythm rate 61 bpm no acute ST elevation or depression ED noted no acute ischemia ____________________________________________  RADIOLOGY  Pertinent labs & imaging results that were available during  my care of the patient were reviewed by me and considered in my medical decision making (see chart for details). If possible, patient and/or family made aware of any abnormal findings.  No  results found. ____________________________________________    PROCEDURES  Procedure(s) performed: None  Procedures  Critical Care performed: None  ____________________________________________   INITIAL IMPRESSION / ASSESSMENT AND PLAN / ED COURSE  Pertinent labs & imaging results that were available during my care of the patient were reviewed by me and considered in my medical decision making (see chart for details).  Moving up here and away from her husband who is in a nursing home, patient has been increasingly anxious and feeling generally unwell for some time.  She wonders if perhaps it could be black mold responsible although she has no known mold exposures.  She is in the middle of an extensive work-up for this by her primary care doctor.  The patient states she does have a headache which is her normal headache, nothing to suggest bleed, she is on Eliquis, no she has an aneurysm but there is no evidence of aneurysmal event.  She states she feels generally on edge recently.  No SI or HI, blood work is reassuring vital signs are reassuring blood pressure slightly elevated as one would expect given her anxiety.  She would like treatment for her migraine.  I did explain to her I am not going to find the cause of her intermittent tremor and she is very comfortable with that.  Patient and family feel that if we can give her something for her migraine which is not the worst headache of life gradual onset etc., she will feel well enough to go home.  Accordingly, I did give her a migraine cocktail and she is completely headache free at this time laughing and joking and has no complaints.  We talked about CT scan but patient would prefer not to have it having had these headaches for years and states that she is  regularly imaged already, she states that she would prefer not to have any imaging today, the risk benefits alternative discussed of action been explained and understood.  She does have outpatient follow closely with Dr. Sabra Heck and I know him to be a competent physician who I am encouraged to think we will get to the bottom of her symptoms    ____________________________________________   FINAL CLINICAL IMPRESSION(S) / ED DIAGNOSES  Final diagnoses:  None      This chart was dictated using voice recognition software.  Despite best efforts to proofread,  errors can occur which can change meaning.      Schuyler Amor, MD 09/10/18 [phone]

## 2018-09-09 NOTE — ED Triage Notes (Signed)
Pt to ED via [facility] with complaints of N/V/D, dizziness, tremors and losing weight of 20 in the last 4 months. N/V/D,dizziness  has been over the last two days. Tremors has been a week.

## 2018-09-10 LAB — URINALYSIS, COMPLETE (UACMP) WITH MICROSCOPIC
BACTERIA UA: NONE SEEN
Bilirubin Urine: NEGATIVE
Glucose, UA: NEGATIVE mg/dL
KETONES UR: 5 mg/dL — AB
LEUKOCYTES UA: NEGATIVE
NITRITE: NEGATIVE
PH: 7 (ref 5.0–8.0)
Protein, ur: 100 mg/dL — AB
SPECIFIC GRAVITY, URINE: 1.006 (ref 1.005–1.030)

## 2018-09-10 NOTE — ED Notes (Signed)
Patient is resting comfortably. 

## 2018-09-10 NOTE — ED Notes (Signed)
Family at bedside. 

## 2018-09-10 NOTE — Discharge Instructions (Addendum)
If you have any new or worrisome symptoms return to the emergency room this would include headaches that are atypical for your numbness or weakness, vomiting diarrhea that is persistent any bleeding from your bottom or any other concerns.  Your blood work is reassuring today please follow closely with your primary care doctor Dr. Sabra Heck and the neurologist listed above who will help you with your tremor and your chronic headaches.

## 2018-09-17 ENCOUNTER — Ambulatory Visit
Admission: RE | Admit: 2018-09-17 | Discharge: 2018-09-17 | Disposition: A | Discharge status: Home / Self Care | Payer: Medicare Other | Source: Ambulatory Visit | Attending: Internal Medicine | Admitting: Internal Medicine

## 2018-09-17 ENCOUNTER — Other Ambulatory Visit: Payer: Self-pay | Admitting: Internal Medicine

## 2018-09-17 DIAGNOSIS — G319 Degenerative disease of nervous system, unspecified: Secondary | ICD-10-CM | POA: Insufficient documentation

## 2018-09-17 DIAGNOSIS — G4485 Primary stabbing headache: Secondary | ICD-10-CM

## 2018-09-17 DIAGNOSIS — I671 Cerebral aneurysm, nonruptured: Secondary | ICD-10-CM | POA: Insufficient documentation

## 2018-10-13 ENCOUNTER — Other Ambulatory Visit: Payer: Self-pay | Admitting: Internal Medicine

## 2018-10-13 DIAGNOSIS — G44041 Chronic paroxysmal hemicrania, intractable: Secondary | ICD-10-CM

## 2018-10-31 ENCOUNTER — Ambulatory Visit
Admission: RE | Admit: 2018-10-31 | Discharge: 2018-10-31 | Disposition: A | Discharge status: Home / Self Care | Payer: Medicare Other | Source: Ambulatory Visit | Attending: Internal Medicine | Admitting: Internal Medicine

## 2018-10-31 DIAGNOSIS — G44041 Chronic paroxysmal hemicrania, intractable: Secondary | ICD-10-CM | POA: Insufficient documentation

## 2018-10-31 DIAGNOSIS — I671 Cerebral aneurysm, nonruptured: Secondary | ICD-10-CM | POA: Insufficient documentation

## 2018-10-31 MED ORDER — GADOBUTROL 1 MMOL/ML IV SOLN
7.0000 mL | Freq: Once | INTRAVENOUS | Status: AC | PRN
  Administered 2018-10-31: 7 mL via INTRAVENOUS

## 2019-01-06 DIAGNOSIS — Z01818 Encounter for other preprocedural examination: Secondary | ICD-10-CM | POA: Insufficient documentation

## 2019-01-06 DIAGNOSIS — I4891 Unspecified atrial fibrillation: Secondary | ICD-10-CM

## 2019-01-06 DIAGNOSIS — I739 Peripheral vascular disease, unspecified: Secondary | ICD-10-CM | POA: Insufficient documentation

## 2019-01-06 HISTORY — DX: Unspecified atrial fibrillation: I48.91

## 2019-01-21 ENCOUNTER — Encounter: Payer: Self-pay | Admitting: Podiatry

## 2019-01-21 ENCOUNTER — Ambulatory Visit (INDEPENDENT_AMBULATORY_CARE_PROVIDER_SITE_OTHER): Payer: Medicare Other

## 2019-01-21 ENCOUNTER — Ambulatory Visit: Payer: Medicare Other | Admitting: Podiatry

## 2019-01-21 ENCOUNTER — Other Ambulatory Visit: Payer: Self-pay | Admitting: Podiatry

## 2019-01-21 VITALS — BP 148/79 | HR 58 | Resp 16

## 2019-01-21 DIAGNOSIS — Q667 Congenital pes cavus, unspecified foot: Secondary | ICD-10-CM

## 2019-01-21 DIAGNOSIS — E119 Type 2 diabetes mellitus without complications: Secondary | ICD-10-CM | POA: Insufficient documentation

## 2019-01-21 DIAGNOSIS — M722 Plantar fascial fibromatosis: Secondary | ICD-10-CM | POA: Diagnosis not present

## 2019-01-21 DIAGNOSIS — M659 Synovitis and tenosynovitis, unspecified: Secondary | ICD-10-CM | POA: Insufficient documentation

## 2019-01-21 DIAGNOSIS — I471 Supraventricular tachycardia, unspecified: Secondary | ICD-10-CM | POA: Insufficient documentation

## 2019-01-21 DIAGNOSIS — M7662 Achilles tendinitis, left leg: Secondary | ICD-10-CM

## 2019-01-21 DIAGNOSIS — E1142 Type 2 diabetes mellitus with diabetic polyneuropathy: Secondary | ICD-10-CM | POA: Insufficient documentation

## 2019-01-21 DIAGNOSIS — E785 Hyperlipidemia, unspecified: Secondary | ICD-10-CM | POA: Insufficient documentation

## 2019-01-21 MED ORDER — MELOXICAM 15 MG PO TABS: 15.0000 mg | ORAL_TABLET | Freq: Every day | ORAL | 3 refills | Status: DC

## 2019-01-21 NOTE — Progress Notes (Addendum)
Subjective:  Patient ID: Alison Richardson, female    DOB: 1932/12/28,  MRN: [id number] HPI Chief Complaint  Patient presents with  . Foot Pain    Posterior heel left - aching x 2 months, wearing night brace someone gave her, tried stretching and Tylenol-some help - Questioning custom inserts or shoes  . Nail Problem    Check hallux nail right     85 y.o. female presents with the above complaint.   ROS: Denies fever chills nausea vomiting muscle aches pains calf pain back pain chest pain shortness of breath.  Past Medical History:  Diagnosis Date  . Arrhythmia   . Atrial flutter ([place]) 01/2018   new onset   . Basal cell carcinoma of back   . Basal cell carcinoma of lip   . Cerebral aneurysm    followed by [facility]  . DDD (degenerative disc disease), lumbar    superior plate depression, L3 08/18/2014  . Diabetes mellitus type II, controlled ([place])   . Diverticulosis   . Dysthymia    depression  . GERD (gastroesophageal reflux disease)   . History of meniscal tear   . Hyperlipidemia   . Hypertension   . Mild pulmonary hypertension ([place])   . Overactive bladder   . Seasonal allergic rhinitis    Past Surgical History:  Procedure Laterality Date  . APPENDECTOMY  1946  . BASAL CELL CARCINOMA EXCISION     2006 and 2009 removed from back and lip  . CARDIOVASCULAR STRESS TEST  2015   nuclear cardiac stress test negative for ischemia - Dr. Satira Sark  . CATARACT EXTRACTION, BILATERAL  2006  . COLONOSCOPY  2014  . [place]  . INJECTION KNEE Left 05/2015  . REPLACEMENT TOTAL KNEE Left 09/06/2016   Dr. Barnet Pall  . TONSILLECTOMY AND ADENOIDECTOMY  1953  . TOTAL ABDOMINAL HYSTERECTOMY  1986   [facility]    Current Outpatient Medications:  .  acetaminophen (TYLENOL) 500 MG tablet, Take 500 mg by mouth every 6 (six) hours as needed., Disp: , Rfl:  .  amiodarone (PACERONE) 200 MG tablet, , Disp: , Rfl:  .  apixaban (ELIQUIS) 5 MG TABS tablet, Take 5 mg by mouth 2 (two) times  daily., Disp: , Rfl:  .  atorvastatin (LIPITOR) 10 MG tablet, Take 10 mg by mouth daily., Disp: , Rfl:  .  donepezil (ARICEPT) 10 MG tablet, Take 10 mg by mouth at bedtime., Disp: , Rfl:  .  gabapentin (NEURONTIN) 100 MG capsule, Take 100 mg by mouth at bedtime., Disp: , Rfl:  .  meloxicam (MOBIC) 15 MG tablet, Take 1 tablet (15 mg total) by mouth daily., Disp: 30 tablet, Rfl: 3 .  metFORMIN (GLUCOPHAGE) 500 MG tablet, Take 500 mg by mouth 2 (two) times daily with a meal., Disp: , Rfl:  .  metoprolol succinate (TOPROL-XL) 100 MG 24 hr tablet, Take 150 mg by mouth daily. 1 and 1/2 tabs Take with or immediately following a meal., Disp: , Rfl:  .  metoprolol tartrate (LOPRESSOR) 25 MG tablet, Take 25 mg by mouth every 8 (eight) hours as needed. tachycardia- heart rate >110, Disp: , Rfl:  .  oxybutynin (DITROPAN) 5 MG tablet, Take 5 mg by mouth 2 (two) times daily., Disp: , Rfl:  .  potassium chloride (K-DUR) 10 MEQ tablet, Take 10 mEq by mouth daily., Disp: , Rfl:  .  venlafaxine XR (EFFEXOR-XR) 150 MG 24 hr capsule, Take 150 mg by mouth daily with breakfast., Disp: ,  Rfl:   No Known Allergies Review of Systems Objective:   Vitals:   01/21/19 1459  BP: (!) 148/79  Pulse: (!) 58  Resp: 16    General: Well developed, nourished, in no acute distress, alert and oriented x3   Dermatological: Skin is warm, dry and supple bilateral. Nails x 10 are well maintained; remaining integument appears unremarkable at this time. There are no open sores, no preulcerative lesions, no rash or signs of infection present.  Vascular: Dorsalis Pedis artery and Posterior Tibial artery pedal pulses are 2/4 bilateral with immedate capillary fill time. Pedal hair growth present. No varicosities and no lower extremity edema present bilateral.   Neruologic: Grossly intact via light touch bilateral. Vibratory intact via tuning fork bilateral. Protective threshold with Semmes Wienstein monofilament intact to all pedal  sites bilateral. Patellar and Achilles deep tendon reflexes 2+ bilateral. No Babinski or clonus noted bilateral.   Musculoskeletal: No gross boney pedal deformities bilateral. No pain, crepitus, or limitation noted with foot and ankle range of motion bilateral. Muscular strength 5/5 in all groups tested bilateral.  She has pain on palpation of the left medial calcaneal tubercle at the plantar fashion calcaneal insertion site she also has pain on palpation of the insertion of the Achilles tendon on the posterior medial aspect of the calcaneus.  The tendon itself appears to be intact.  Gait: Unassisted, Nonantalgic.    Radiographs:  Radiographs taken today demonstrate plantar distally oriented calcaneal spur and a posterior proximal aortic calcaneal spur both with soft tissue swelling it is at their insertion sites.  Assessment & Plan:   Assessment: Insertional Achilles tendinitis with plantar fasciitis left.    Plan: Discussed etiology pathology and surgical therapies injected 2 mg of dexamethasone with local anesthetic to the posterior aspect of the Achilles area making sure not to inject directly into the Achilles.  Also injected the medial aspect of the heel with 10 mg Kenalog 5 mg Marcaine after sterile Betadine skin prep.  Tolerated procedure well placed her in a plantar fascial brace she already has a night splint that she will use at home we discussed appropriate shoe gear stretching exercise and ice therapy.  Patient is a diabetic and would benefit from diabetic shoes and insoles due to her cavus foot type.    Nichoals Heyde T. [place], [place]

## 2019-01-22 ENCOUNTER — Telehealth: Payer: Self-pay | Admitting: Podiatry

## 2019-01-22 NOTE — Telephone Encounter (Signed)
Pt was seen yesterday and was prescribed Meloxicam. Pts pharmacy called and stated there was a problem with her getting this prescription along with her Eliquis. Please give pt a call.

## 2019-01-28 ENCOUNTER — Ambulatory Visit: Payer: Medicare Other | Admitting: Orthotics

## 2019-01-28 DIAGNOSIS — E785 Hyperlipidemia, unspecified: Secondary | ICD-10-CM

## 2019-01-28 DIAGNOSIS — M722 Plantar fascial fibromatosis: Secondary | ICD-10-CM

## 2019-01-29 NOTE — Telephone Encounter (Signed)
Pharmacy called and stated patient is on Eliquis and wanted to make sure it was ok to take Meloxicam with it.  It was not stated in your note that you gave her Meloxicam.  I wanted to verify before I called pharmacy back.  Please advise

## 2019-02-25 ENCOUNTER — Ambulatory Visit: Payer: Medicare Other | Admitting: Podiatry

## 2019-02-25 ENCOUNTER — Encounter: Payer: Self-pay | Admitting: Podiatry

## 2019-02-25 DIAGNOSIS — M7662 Achilles tendinitis, left leg: Secondary | ICD-10-CM | POA: Diagnosis not present

## 2019-02-25 DIAGNOSIS — M722 Plantar fascial fibromatosis: Secondary | ICD-10-CM

## 2019-02-25 NOTE — Progress Notes (Signed)
She presents today for follow-up of her plantar fasciitis and Achilles tendinitis. He might need a shot again but all in all is feeling a lot better.  Objective: Vital signs are stable alert and oriented x3.  Pulses are palpable.  Her diabetic shoes did not fit her today so we will send those back and have those redone.  Has mild tenderness on palpation of the left Achilles.  Assessment: Achilles tendinitis plantar fasciitis left diabetes mellitus.  Plan: Injected 2 mg of dexamethasone subcutaneously point maximal tenderness of the left medial heel.  Also placed her in a plantar fascial brace and reordered her diabetic shoes.

## 2019-05-13 ENCOUNTER — Other Ambulatory Visit: Payer: Self-pay

## 2019-05-13 ENCOUNTER — Ambulatory Visit (INDEPENDENT_AMBULATORY_CARE_PROVIDER_SITE_OTHER): Payer: Medicare Other | Admitting: Orthotics

## 2019-05-13 DIAGNOSIS — Q667 Congenital pes cavus, unspecified foot: Secondary | ICD-10-CM

## 2019-05-13 DIAGNOSIS — E119 Type 2 diabetes mellitus without complications: Secondary | ICD-10-CM | POA: Diagnosis not present

## 2019-05-13 DIAGNOSIS — M722 Plantar fascial fibromatosis: Secondary | ICD-10-CM | POA: Diagnosis not present

## 2019-05-13 DIAGNOSIS — M7662 Achilles tendinitis, left leg: Secondary | ICD-10-CM | POA: Diagnosis not present

## 2019-05-13 DIAGNOSIS — I739 Peripheral vascular disease, unspecified: Secondary | ICD-10-CM

## 2019-05-13 NOTE — Progress Notes (Signed)

## 2019-05-20 NOTE — Progress Notes (Signed)

## 2019-07-09 DIAGNOSIS — G4733 Obstructive sleep apnea (adult) (pediatric): Secondary | ICD-10-CM | POA: Insufficient documentation

## 2019-09-23 DIAGNOSIS — F0280 Dementia in other diseases classified elsewhere without behavioral disturbance: Secondary | ICD-10-CM | POA: Insufficient documentation

## 2019-09-23 DIAGNOSIS — G301 Alzheimer's disease with late onset: Secondary | ICD-10-CM | POA: Insufficient documentation

## 2019-09-27 DIAGNOSIS — I2781 Cor pulmonale (chronic): Secondary | ICD-10-CM | POA: Insufficient documentation

## 2019-09-29 ENCOUNTER — Inpatient Hospital Stay
Admission: EM | Admit: 2019-09-29 | Discharge: 2019-10-01 | DRG: 084 | Disposition: A | Discharge status: Home Health | Payer: Medicare Other | Attending: Internal Medicine | Admitting: Internal Medicine

## 2019-09-29 ENCOUNTER — Emergency Department: Payer: Medicare Other

## 2019-09-29 ENCOUNTER — Observation Stay
Admit: 2019-09-29 | Discharge: 2019-09-29 | Disposition: A | Discharge status: Home / Self Care | Payer: Medicare Other | Attending: Internal Medicine | Admitting: Internal Medicine

## 2019-09-29 ENCOUNTER — Observation Stay: Payer: Medicare Other

## 2019-09-29 ENCOUNTER — Encounter: Payer: Self-pay | Admitting: Medical Oncology

## 2019-09-29 ENCOUNTER — Other Ambulatory Visit: Payer: Self-pay

## 2019-09-29 DIAGNOSIS — R55 Syncope and collapse: Secondary | ICD-10-CM | POA: Diagnosis not present

## 2019-09-29 DIAGNOSIS — E782 Mixed hyperlipidemia: Secondary | ICD-10-CM | POA: Diagnosis present

## 2019-09-29 DIAGNOSIS — I10 Essential (primary) hypertension: Secondary | ICD-10-CM | POA: Diagnosis present

## 2019-09-29 DIAGNOSIS — Z96652 Presence of left artificial knee joint: Secondary | ICD-10-CM | POA: Diagnosis present

## 2019-09-29 DIAGNOSIS — Z79890 Hormone replacement therapy (postmenopausal): Secondary | ICD-10-CM

## 2019-09-29 DIAGNOSIS — E785 Hyperlipidemia, unspecified: Secondary | ICD-10-CM | POA: Diagnosis present

## 2019-09-29 DIAGNOSIS — Z85828 Personal history of other malignant neoplasm of skin: Secondary | ICD-10-CM

## 2019-09-29 DIAGNOSIS — Z9842 Cataract extraction status, left eye: Secondary | ICD-10-CM

## 2019-09-29 DIAGNOSIS — F329 Major depressive disorder, single episode, unspecified: Secondary | ICD-10-CM | POA: Diagnosis present

## 2019-09-29 DIAGNOSIS — F419 Anxiety disorder, unspecified: Secondary | ICD-10-CM | POA: Diagnosis present

## 2019-09-29 DIAGNOSIS — E7800 Pure hypercholesterolemia, unspecified: Secondary | ICD-10-CM | POA: Diagnosis present

## 2019-09-29 DIAGNOSIS — G3184 Mild cognitive impairment, so stated: Secondary | ICD-10-CM | POA: Diagnosis present

## 2019-09-29 DIAGNOSIS — E1140 Type 2 diabetes mellitus with diabetic neuropathy, unspecified: Secondary | ICD-10-CM | POA: Diagnosis present

## 2019-09-29 DIAGNOSIS — S066X9A Traumatic subarachnoid hemorrhage with loss of consciousness of unspecified duration, initial encounter: Secondary | ICD-10-CM | POA: Diagnosis not present

## 2019-09-29 DIAGNOSIS — I671 Cerebral aneurysm, nonruptured: Secondary | ICD-10-CM | POA: Diagnosis present

## 2019-09-29 DIAGNOSIS — N3281 Overactive bladder: Secondary | ICD-10-CM | POA: Diagnosis present

## 2019-09-29 DIAGNOSIS — K219 Gastro-esophageal reflux disease without esophagitis: Secondary | ICD-10-CM | POA: Diagnosis present

## 2019-09-29 DIAGNOSIS — W19XXXA Unspecified fall, initial encounter: Secondary | ICD-10-CM | POA: Diagnosis present

## 2019-09-29 DIAGNOSIS — Z66 Do not resuscitate: Secondary | ICD-10-CM | POA: Diagnosis present

## 2019-09-29 DIAGNOSIS — I480 Paroxysmal atrial fibrillation: Secondary | ICD-10-CM | POA: Diagnosis present

## 2019-09-29 DIAGNOSIS — I951 Orthostatic hypotension: Secondary | ICD-10-CM | POA: Diagnosis present

## 2019-09-29 DIAGNOSIS — I272 Pulmonary hypertension, unspecified: Secondary | ICD-10-CM | POA: Diagnosis present

## 2019-09-29 DIAGNOSIS — Z23 Encounter for immunization: Secondary | ICD-10-CM

## 2019-09-29 DIAGNOSIS — E876 Hypokalemia: Secondary | ICD-10-CM | POA: Diagnosis present

## 2019-09-29 DIAGNOSIS — I609 Nontraumatic subarachnoid hemorrhage, unspecified: Secondary | ICD-10-CM

## 2019-09-29 DIAGNOSIS — D320 Benign neoplasm of cerebral meninges: Secondary | ICD-10-CM | POA: Diagnosis present

## 2019-09-29 DIAGNOSIS — Z20828 Contact with and (suspected) exposure to other viral communicable diseases: Secondary | ICD-10-CM | POA: Diagnosis present

## 2019-09-29 DIAGNOSIS — S0990XA Unspecified injury of head, initial encounter: Secondary | ICD-10-CM

## 2019-09-29 DIAGNOSIS — Z9841 Cataract extraction status, right eye: Secondary | ICD-10-CM

## 2019-09-29 DIAGNOSIS — Z7901 Long term (current) use of anticoagulants: Secondary | ICD-10-CM

## 2019-09-29 DIAGNOSIS — Z79899 Other long term (current) drug therapy: Secondary | ICD-10-CM

## 2019-09-29 DIAGNOSIS — Z791 Long term (current) use of non-steroidal anti-inflammatories (NSAID): Secondary | ICD-10-CM

## 2019-09-29 DIAGNOSIS — Z90710 Acquired absence of both cervix and uterus: Secondary | ICD-10-CM

## 2019-09-29 DIAGNOSIS — Z8249 Family history of ischemic heart disease and other diseases of the circulatory system: Secondary | ICD-10-CM

## 2019-09-29 DIAGNOSIS — M25552 Pain in left hip: Secondary | ICD-10-CM

## 2019-09-29 DIAGNOSIS — S01112A Laceration without foreign body of left eyelid and periocular area, initial encounter: Secondary | ICD-10-CM | POA: Diagnosis present

## 2019-09-29 LAB — URINALYSIS, COMPLETE (UACMP) WITH MICROSCOPIC
Bacteria, UA: NONE SEEN
Bilirubin Urine: NEGATIVE
Glucose, UA: >=500 mg/dL — AB
Ketones, ur: 5 mg/dL — AB
Nitrite: NEGATIVE
Protein, ur: NEGATIVE mg/dL
Specific Gravity, Urine: 1.014 (ref 1.005–1.030)
pH: 6 (ref 5.0–8.0)

## 2019-09-29 LAB — CBC WITH DIFFERENTIAL/PLATELET
Abs Immature Granulocytes: 0.07 10*3/uL (ref 0.00–0.07)
Basophils Absolute: 0.1 10*3/uL (ref 0.0–0.1)
Basophils Relative: 1 %
Eosinophils Absolute: 0.3 10*3/uL (ref 0.0–0.5)
Eosinophils Relative: 2 %
HCT: 45.3 % (ref 36.0–46.0)
Hemoglobin: 15.9 g/dL — ABNORMAL HIGH (ref 12.0–15.0)
Immature Granulocytes: 1 %
Lymphocytes Relative: 34 %
Lymphs Abs: 4.6 10*3/uL — ABNORMAL HIGH (ref 0.7–4.0)
MCH: 30.9 pg (ref 26.0–34.0)
MCHC: 35.1 g/dL (ref 30.0–36.0)
MCV: 88.1 fL (ref 80.0–100.0)
Monocytes Absolute: 1 10*3/uL (ref 0.1–1.0)
Monocytes Relative: 7 %
Neutro Abs: 7.5 10*3/uL (ref 1.7–7.7)
Neutrophils Relative %: 55 %
Platelets: 284 10*3/uL (ref 150–400)
RBC: 5.14 MIL/uL — ABNORMAL HIGH (ref 3.87–5.11)
RDW: 12.1 % (ref 11.5–15.5)
WBC: 13.5 10*3/uL — ABNORMAL HIGH (ref 4.0–10.5)
nRBC: 0 % (ref 0.0–0.2)

## 2019-09-29 LAB — COMPREHENSIVE METABOLIC PANEL
ALT: 50 U/L — ABNORMAL HIGH (ref 0–44)
AST: 51 U/L — ABNORMAL HIGH (ref 15–41)
Albumin: 4.6 g/dL (ref 3.5–5.0)
Alkaline Phosphatase: 117 U/L (ref 38–126)
Anion gap: 17 — ABNORMAL HIGH (ref 5–15)
BUN: 19 mg/dL (ref 8–23)
CO2: 25 mmol/L (ref 22–32)
Calcium: 9.7 mg/dL (ref 8.9–10.3)
Chloride: 94 mmol/L — ABNORMAL LOW (ref 98–111)
Creatinine, Ser: 0.84 mg/dL (ref 0.44–1.00)
GFR calc Af Amer: >60 mL/min (ref >60)
GFR calc non Af Amer: >60 mL/min (ref >60)
Glucose, Bld: 264 mg/dL — ABNORMAL HIGH (ref 70–99)
Potassium: 3.2 mmol/L — ABNORMAL LOW (ref 3.5–5.1)
Sodium: 136 mmol/L (ref 135–145)
Total Bilirubin: 0.8 mg/dL (ref 0.3–1.2)
Total Protein: 8 g/dL (ref 6.5–8.1)

## 2019-09-29 LAB — HEMOGLOBIN A1C
Hgb A1c MFr Bld: 7.4 % — ABNORMAL HIGH (ref 4.8–5.6)
Mean Plasma Glucose: 165.68 mg/dL

## 2019-09-29 LAB — GLUCOSE, CAPILLARY
Glucose-Capillary: 242 mg/dL — ABNORMAL HIGH (ref 70–99)
Glucose-Capillary: 181 mg/dL — ABNORMAL HIGH (ref 70–99)

## 2019-09-29 LAB — TROPONIN I (HIGH SENSITIVITY)
Troponin I (High Sensitivity): 7 ng/L (ref <18)
Troponin I (High Sensitivity): 7 ng/L (ref <18)

## 2019-09-29 LAB — MAGNESIUM: Magnesium: 2.4 mg/dL (ref 1.7–2.4)

## 2019-09-29 LAB — SARS CORONAVIRUS 2 (TAT 6-24 HRS): SARS Coronavirus 2: NEGATIVE

## 2019-09-29 MED ORDER — ACETAMINOPHEN 650 MG RE SUPP: 650.0000 mg | Freq: Four times a day (QID) | RECTAL | Status: DC | PRN

## 2019-09-29 MED ORDER — OXYBUTYNIN CHLORIDE 5 MG PO TABS
5.0000 mg | ORAL_TABLET | Freq: Every day | ORAL | Status: DC
  Administered 2019-09-29 – 2019-10-01 (×3): 5 mg via ORAL
  Filled 2019-09-29 (×3): qty 1

## 2019-09-29 MED ORDER — AMIODARONE HCL 200 MG PO TABS
200.0000 mg | ORAL_TABLET | Freq: Every day | ORAL | Status: DC
  Administered 2019-09-29 – 2019-10-01 (×3): 200 mg via ORAL
  Filled 2019-09-29 (×3): qty 1

## 2019-09-29 MED ORDER — DONEPEZIL HCL 5 MG PO TABS
10.0000 mg | ORAL_TABLET | Freq: Every day | ORAL | Status: DC
  Administered 2019-09-29 – 2019-09-30 (×2): 10 mg via ORAL
  Filled 2019-09-29 (×3): qty 2

## 2019-09-29 MED ORDER — INSULIN ASPART 100 UNIT/ML ~~LOC~~ SOLN
0.0000 [IU] | Freq: Every day | SUBCUTANEOUS | Status: DC
  Administered 2019-09-30: 22:00:00 2 [IU] via SUBCUTANEOUS
  Filled 2019-09-29: qty 1

## 2019-09-29 MED ORDER — VENLAFAXINE HCL ER 75 MG PO CP24
75.0000 mg | ORAL_CAPSULE | Freq: Every day | ORAL | Status: DC
  Administered 2019-09-30 – 2019-10-01 (×2): 75 mg via ORAL
  Filled 2019-09-29 (×2): qty 1

## 2019-09-29 MED ORDER — POLYETHYLENE GLYCOL 3350 17 G PO PACK: 17.0000 g | PACK | Freq: Every day | ORAL | Status: DC | PRN

## 2019-09-29 MED ORDER — POTASSIUM CHLORIDE CRYS ER 20 MEQ PO TBCR
40.0000 meq | EXTENDED_RELEASE_TABLET | Freq: Once | ORAL | Status: AC
  Administered 2019-09-29: 40 meq via ORAL
  Filled 2019-09-29: qty 2

## 2019-09-29 MED ORDER — POTASSIUM CHLORIDE CRYS ER 10 MEQ PO TBCR: 10.0000 meq | EXTENDED_RELEASE_TABLET | Freq: Two times a day (BID) | ORAL | Status: DC

## 2019-09-29 MED ORDER — TETANUS-DIPHTHERIA TOXOIDS TD 5-2 LFU IM INJ
0.5000 mL | INJECTION | Freq: Once | INTRAMUSCULAR | Status: AC
  Administered 2019-09-29: 05:00:00 0.5 mL via INTRAMUSCULAR
  Filled 2019-09-29: qty 0.5

## 2019-09-29 MED ORDER — TRAMADOL HCL 50 MG PO TABS: 50.0000 mg | ORAL_TABLET | Freq: Four times a day (QID) | ORAL | Status: DC | PRN

## 2019-09-29 MED ORDER — SODIUM CHLORIDE 0.9% FLUSH
3.0000 mL | Freq: Two times a day (BID) | INTRAVENOUS | Status: DC
  Administered 2019-09-29 – 2019-10-01 (×4): 3 mL via INTRAVENOUS

## 2019-09-29 MED ORDER — RIVASTIGMINE 4.6 MG/24HR TD PT24
4.6000 mg | MEDICATED_PATCH | Freq: Every day | TRANSDERMAL | Status: DC
  Administered 2019-09-29 – 2019-10-01 (×3): 4.6 mg via TRANSDERMAL
  Filled 2019-09-29 (×3): qty 1

## 2019-09-29 MED ORDER — ACETAMINOPHEN 325 MG PO TABS
650.0000 mg | ORAL_TABLET | Freq: Four times a day (QID) | ORAL | Status: DC | PRN
  Administered 2019-09-29 – 2019-09-30 (×2): 650 mg via ORAL
  Filled 2019-09-29 (×2): qty 2

## 2019-09-29 MED ORDER — PANTOPRAZOLE SODIUM 40 MG PO TBEC
40.0000 mg | DELAYED_RELEASE_TABLET | Freq: Every day | ORAL | Status: DC
  Administered 2019-09-29 – 2019-10-01 (×3): 40 mg via ORAL
  Filled 2019-09-29 (×3): qty 1

## 2019-09-29 MED ORDER — LEVOTHYROXINE SODIUM 50 MCG PO TABS
50.0000 ug | ORAL_TABLET | Freq: Every day | ORAL | Status: DC
  Administered 2019-09-30 – 2019-10-01 (×2): 50 ug via ORAL
  Filled 2019-09-29 (×2): qty 1

## 2019-09-29 MED ORDER — INSULIN ASPART 100 UNIT/ML ~~LOC~~ SOLN
0.0000 [IU] | Freq: Three times a day (TID) | SUBCUTANEOUS | Status: DC
  Administered 2019-09-29: 5 [IU] via SUBCUTANEOUS
  Administered 2019-09-30: 12:00:00 3 [IU] via SUBCUTANEOUS
  Administered 2019-09-30: 5 [IU] via SUBCUTANEOUS
  Administered 2019-10-01 (×2): 3 [IU] via SUBCUTANEOUS
  Filled 2019-09-29 (×5): qty 1

## 2019-09-29 MED ORDER — ONDANSETRON HCL 4 MG/2ML IJ SOLN: 4.0000 mg | Freq: Four times a day (QID) | INTRAMUSCULAR | Status: DC | PRN

## 2019-09-29 MED ORDER — METOPROLOL SUCCINATE ER 25 MG PO TB24
12.5000 mg | ORAL_TABLET | Freq: Two times a day (BID) | ORAL | Status: DC
  Administered 2019-09-29 – 2019-10-01 (×5): 12.5 mg via ORAL
  Filled 2019-09-29 (×5): qty 1

## 2019-09-29 MED ORDER — ACETAMINOPHEN 500 MG PO TABS
1000.0000 mg | ORAL_TABLET | Freq: Once | ORAL | Status: AC
  Administered 2019-09-29: 1000 mg via ORAL
  Filled 2019-09-29: qty 2

## 2019-09-29 MED ORDER — ATORVASTATIN CALCIUM 10 MG PO TABS
10.0000 mg | ORAL_TABLET | Freq: Every day | ORAL | Status: DC
  Administered 2019-09-29 – 2019-10-01 (×3): 10 mg via ORAL
  Filled 2019-09-29 (×4): qty 1

## 2019-09-29 MED ORDER — ONDANSETRON HCL 4 MG PO TABS: 4.0000 mg | ORAL_TABLET | Freq: Four times a day (QID) | ORAL | Status: DC | PRN

## 2019-09-29 MED ORDER — AMLODIPINE BESYLATE 5 MG PO TABS
5.0000 mg | ORAL_TABLET | Freq: Every day | ORAL | Status: DC
  Administered 2019-09-29 – 2019-10-01 (×3): 5 mg via ORAL
  Filled 2019-09-29 (×3): qty 1

## 2019-09-29 NOTE — ED Notes (Signed)
Walked pt to the toilet. Placed pt back in bed with call bell within reach.

## 2019-09-29 NOTE — ED Triage Notes (Signed)
Pt from [place] via ems with reports of getting up from bed to have BM and had a syncopal episode, falling to ground, hitting head. Lac to left eyebrow. Pt currently on eliquis. Reports headache.

## 2019-09-29 NOTE — H&P (Signed)
[facility] NAME: Alyea Barge    MR#:  [id number]  DATE OF BIRTH:  Dec 11, 1933  DATE OF ADMISSION:  09/29/2019  PRIMARY CARE PHYSICIAN: Rusty Aus, MD   REQUESTING/REFERRING PHYSICIAN: Dr. Corky Downs  CHIEF COMPLAINT:   Chief Complaint  Patient presents with  . Loss of Consciousness  . Head Injury  . Laceration    HISTORY OF PRESENT ILLNESS:  Darbey Borbon  is a 85 y.o. female with a known history of atrial fibrillation on Eliquis, diabetes mellitus, hypertension, hyperlipidemia, right MCA aneurysm presents to the emergency room from her independent living facility after patient got up from bed took a few steps and passed out landing on her left side.  Patient had significant neck and head pain.  Was brought to the emergency room.  Here she had a left frontal subarachnoid hemorrhage which was minute.  CT scan was repeated in 6 hours and was stable.  Neurosurgery Dr. Lacinda Axon has seen the patient in the emergency room and advised no further work-up but stop Eliquis for 7 days.  At this point patient is being admitted for further work-up and monitoring of her syncopal episode. She had no chest pain or shortness of breath.  She did feel dizzy and lightheaded prior to passing out.  Due to lower extremity swelling she was started on Lasix 3 days back and her swelling has resolved at this point.  No other change in medications.  She does have significant bruising around her left eye but no change in vision.  PAST MEDICAL HISTORY:   Past Medical History:  Diagnosis Date  . Arrhythmia   . Atrial flutter ([place]) 01/2018   new onset   . Basal cell carcinoma of back   . Basal cell carcinoma of lip   . Cerebral aneurysm    followed by [facility]  . DDD (degenerative disc disease), lumbar    superior plate depression, L3 08/18/2014  . Diabetes mellitus type II, controlled ([place])   . Diverticulosis   . Dysthymia    depression  . GERD (gastroesophageal  reflux disease)   . History of meniscal tear   . Hyperlipidemia   . Hypertension   . Mild pulmonary hypertension ([place])   . Overactive bladder   . Seasonal allergic rhinitis     PAST SURGICAL HISTORY:   Past Surgical History:  Procedure Laterality Date  . APPENDECTOMY  1946  . BASAL CELL CARCINOMA EXCISION     2006 and 2009 removed from back and lip  . CARDIOVASCULAR STRESS TEST  2015   nuclear cardiac stress test negative for ischemia - Dr. Satira Sark  . CATARACT EXTRACTION, BILATERAL  2006  . COLONOSCOPY  2014  . [place]  . INJECTION KNEE Left 05/2015  . REPLACEMENT TOTAL KNEE Left 09/06/2016   Dr. Barnet Pall  . TONSILLECTOMY AND ADENOIDECTOMY  1953  . TOTAL ABDOMINAL HYSTERECTOMY  1986   [facility]    SOCIAL HISTORY:   Social History   Tobacco Use  . Smoking status: Never Smoker  . Smokeless tobacco: Never Used  Substance Use Topics  . Alcohol use: Not Currently    FAMILY HISTORY:   Family History  Problem Relation Age of Onset  . Hypertension Mother   . CAD Father     DRUG ALLERGIES:  No Known Allergies  REVIEW OF SYSTEMS:   Review of Systems  Constitutional: Positive for malaise/fatigue. Negative for chills and fever.  HENT:  Negative for sore throat.   Eyes: Negative for blurred vision, double vision and pain.  Respiratory: Negative for cough, hemoptysis, shortness of breath and wheezing.   Cardiovascular: Negative for chest pain, palpitations, orthopnea and leg swelling.  Gastrointestinal: Negative for abdominal pain, constipation, diarrhea, heartburn, nausea and vomiting.  Genitourinary: Negative for dysuria and hematuria.  Musculoskeletal: Negative for back pain and joint pain.  Skin: Negative for rash.  Neurological: Positive for loss of consciousness. Negative for sensory change, speech change, focal weakness and headaches.  Endo/Heme/Allergies: Does not bruise/bleed easily.  Psychiatric/Behavioral: Negative for depression. The  patient is not nervous/anxious.     MEDICATIONS AT HOME:   Prior to Admission medications   Medication Sig Start Date End Date Taking? Authorizing Provider  acetaminophen (TYLENOL) 650 MG CR tablet Take 650 mg by mouth every 8 (eight) hours as needed for pain.   Yes [provider]  amiodarone (PACERONE) 200 MG tablet Take 200 mg by mouth daily.  01/02/19  Yes [provider]  amLODipine (NORVASC) 5 MG tablet Take 5 mg by mouth daily. 09/21/19  Yes [provider]  apixaban (ELIQUIS) 5 MG TABS tablet Take 5 mg by mouth 2 (two) times daily.    Yes [provider]  atorvastatin (LIPITOR) 10 MG tablet Take 10 mg by mouth daily.   Yes [provider]  donepezil (ARICEPT) 10 MG tablet Take 10 mg by mouth at bedtime.   Yes [provider]  furosemide (LASIX) 20 MG tablet Take 20 mg by mouth daily. 09/23/19  Yes [provider]  levothyroxine (SYNTHROID) 50 MCG tablet Take 50 mcg by mouth daily. 09/21/19  Yes [provider]  metoprolol succinate (TOPROL-XL) 25 MG 24 hr tablet Take 12.5 mg by mouth 2 (two) times daily.   Yes [provider]  omeprazole (PRILOSEC) 20 MG capsule Take 20 mg by mouth daily.   Yes [provider]  oxybutynin (DITROPAN) 5 MG tablet Take 5 mg by mouth daily.    Yes [provider]  potassium chloride (K-DUR) 10 MEQ tablet Take 10 mEq by mouth 2 (two) times daily.    Yes [provider]  rivastigmine (EXELON) 4.6 mg/24hr Place 4.6 mg onto the skin daily.   Yes [provider]  venlafaxine XR (EFFEXOR-XR) 75 MG 24 hr capsule Take 75 mg by mouth daily with breakfast.    Yes [provider]  meloxicam (MOBIC) 15 MG tablet Take 1 tablet (15 mg total) by mouth daily. Patient not taking: Reported on 09/29/2019 01/21/19   Hyatt, Max T, DPM     VITAL SIGNS:  Blood pressure (!) 145/71, pulse 68, temperature (!) 97.5 F (36.4 C), temperature source Oral, resp.  rate 18, height 5\' 6"  (1.676 m), weight 77.1 kg, SpO2 95 %.  PHYSICAL EXAMINATION:  Physical Exam  GENERAL:  85 y.o.-year-old patient lying in the bed with no acute distress.  EYES: Pupils equal, round, reactive to light and accommodation. No scleral icterus. Extraocular muscles intact.  HEENT: Head  normocephalic. Oropharynx and nasopharynx clear. No oropharyngeal erythema, moist oral mucosa. Bruising around left eye NECK:  Supple, no jugular venous distention. No thyroid enlargement, no tenderness.  LUNGS: Normal breath sounds bilaterally, no wheezing, rales, rhonchi. No use of accessory muscles of respiration.  CARDIOVASCULAR: S1, S2 normal. No murmurs, rubs, or gallops.  ABDOMEN: Soft, nontender, nondistended. Bowel sounds present. No organomegaly or mass.  EXTREMITIES: No pedal edema, cyanosis, or clubbing. + 2 pedal & radial pulses b/l.  NEUROLOGIC: Cranial nerves II through XII are intact. No focal Motor or sensory deficits appreciated b/l PSYCHIATRIC: The patient is alert and oriented x 3. Good affect.  SKIN: No obvious rash, lesion, or ulcer.   LABORATORY PANEL:   CBC Recent Labs  Lab 09/29/19 0400  WBC 13.5*  HGB 15.9*  HCT 45.3  PLT 284   ------------------------------------------------------------------------------------------------------------------  Chemistries  Recent Labs  Lab 09/29/19 0400  NA 136  K 3.2*  CL 94*  CO2 25  GLUCOSE 264*  BUN 19  CREATININE 0.84  CALCIUM 9.7  AST 51*  ALT 50*  ALKPHOS 117  BILITOT 0.8   ------------------------------------------------------------------------------------------------------------------  Cardiac Enzymes No results for input(s): TROPONINI in the last 168 hours. ------------------------------------------------------------------------------------------------------------------  RADIOLOGY:  Ct Head Wo Contrast  Result Date: 09/29/2019 CLINICAL DATA:  Follow-up hemorrhage EXAM: CT HEAD WITHOUT CONTRAST  TECHNIQUE: Contiguous axial images were obtained from the base of the skull through the vertex without intravenous contrast. Sagittal and coronal MPR images reconstructed from axial data set. COMPARISON:  Earlier study of 09/29/2019 at 0406 hours FINDINGS: Brain: Generalized atrophy. Normal ventricular morphology. No midline shift or mass effect. Small vessel chronic ischemic changes of deep cerebral white matter. Small focus of subarachnoid blood at a sulcus at the LEFT frontal lobe unchanged. No new areas of intracranial hemorrhage or infarction. Vascular: Atherosclerotic calcifications of internal carotid arteries at skull base. Peripherally calcified mass at anterior aspect of RIGHT middle cranial fossa consistent with RIGHT MCA aneurysm 9 x 10 mm unchanged. Skull: Intact.  Small LEFT frontal scalp hematoma. Sinuses/Orbits: Small air-fluid level within the RIGHT sphenoid sinus. Remaining visualized paranasal sinuses and mastoid air cells clear. Other: N/A IMPRESSION: Again identified small focus of subarachnoid hemorrhage at a sulcus in the LEFT frontal lobe. 9 x 10 mm partially calcified RIGHT middle cerebral artery aneurysm. LEFT frontal scalp hematoma. No new intracranial abnormalities. Electronically Signed   By: Lavonia Dana M.D.   On: 09/29/2019 10:15   Ct Head Wo Contrast  Result Date: 09/29/2019 CLINICAL DATA:  Syncope with head injury. Headache and eyebrow laceration EXAM: CT HEAD WITHOUT CONTRAST CT MAXILLOFACIAL WITHOUT CONTRAST CT CERVICAL SPINE WITHOUT CONTRAST TECHNIQUE: Multidetector CT imaging of the head, cervical spine, and maxillofacial structures were performed using the standard protocol without intravenous contrast. Multiplanar CT image reconstructions of the cervical spine and maxillofacial structures were also generated. COMPARISON:  Head CT 09/17/2018 FINDINGS: CT HEAD FINDINGS Brain: Small area of subarachnoid hemorrhage along the left frontal convexity, most convincing on coronal  and sagittal reformats. 7 mm high-density nodule along the high left frontal convexity, stable from prior. No infarct, hydrocephalus, or shift. Vascular: Peripherally calcified mass along the lower right sylvian fissure measuring 1.1 cm, most consistent with MCA aneurysm based on reformats. Skull: Negative for calvarial fracture.  Left forehead hematoma Other: Critical Value/emergent results were called by telephone at the time of interpretation on 09/29/2019 at 4:41 am to providerJADE SUNG , who verbally acknowledged these results. CT MAXILLOFACIAL FINDINGS Osseous: Negative for fracture or mandibular dislocation Orbits: No postseptal injury.  Bilateral cataract resection Sinuses: Small high-density fluid level in the right sphenoid sinus without visible underlying fracture. Soft tissues: Hematoma above and lateral to the left orbit with gas from laceration. No opaque foreign body CT CERVICAL SPINE FINDINGS Alignment: No traumatic malalignment Skull base and vertebrae: Negative for fracture Soft tissues and spinal canal: No prevertebral fluid or swelling. No visible canal hematoma. Disc levels:  Generalized degenerative disc narrowing and ridging. Upper chest:  Negative IMPRESSION: 1. Small left frontal subarachnoid hemorrhage. 2. Left periorbital contusion and laceration without calvarial fracture. 3. Negative for cervical spine fracture. 4. Known ~ 1.1 cm right sylvian fissure mass compatible with MCA aneurysm. Subcentimeter left frontal meningioma. Electronically Signed   By: Monte Fantasia M.D.   On: 09/29/2019 04:55   Ct Cervical Spine Wo Contrast  Result Date: 09/29/2019 CLINICAL DATA:  Syncope with head injury. Headache and eyebrow laceration EXAM: CT HEAD WITHOUT CONTRAST CT MAXILLOFACIAL WITHOUT CONTRAST CT CERVICAL SPINE WITHOUT CONTRAST TECHNIQUE: Multidetector CT imaging of the head, cervical spine, and maxillofacial structures were performed using the standard protocol without intravenous contrast.  Multiplanar CT image reconstructions of the cervical spine and maxillofacial structures were also generated. COMPARISON:  Head CT 09/17/2018 FINDINGS: CT HEAD FINDINGS Brain: Small area of subarachnoid hemorrhage along the left frontal convexity, most convincing on coronal and sagittal reformats. 7 mm high-density nodule along the high left frontal convexity, stable from prior. No infarct, hydrocephalus, or shift. Vascular: Peripherally calcified mass along the lower right sylvian fissure measuring 1.1 cm, most consistent with MCA aneurysm based on reformats. Skull: Negative for calvarial fracture.  Left forehead hematoma Other: Critical Value/emergent results were called by telephone at the time of interpretation on 09/29/2019 at 4:41 am to providerJADE SUNG , who verbally acknowledged these results. CT MAXILLOFACIAL FINDINGS Osseous: Negative for fracture or mandibular dislocation Orbits: No postseptal injury.  Bilateral cataract resection Sinuses: Small high-density fluid level in the right sphenoid sinus without visible underlying fracture. Soft tissues: Hematoma above and lateral to the left orbit with gas from laceration. No opaque foreign body CT CERVICAL SPINE FINDINGS Alignment: No traumatic malalignment Skull base and vertebrae: Negative for fracture Soft tissues and spinal canal: No prevertebral fluid or swelling. No visible canal hematoma. Disc levels:  Generalized degenerative disc narrowing and ridging. Upper chest: Negative IMPRESSION: 1. Small left frontal subarachnoid hemorrhage. 2. Left periorbital contusion and laceration without calvarial fracture. 3. Negative for cervical spine fracture. 4. Known ~ 1.1 cm right sylvian fissure mass compatible with MCA aneurysm. Subcentimeter left frontal meningioma. Electronically Signed   By: Monte Fantasia M.D.   On: 09/29/2019 04:55   Dg Chest Port 1 View  Result Date: 09/29/2019 CLINICAL DATA:  Syncope EXAM: PORTABLE CHEST 1 VIEW COMPARISON:  10/30/2019  FINDINGS: Normal heart size. Mild aortic tortuosity. There is no edema, consolidation, effusion, or pneumothorax. Generalized osteopenia with mild levoscoliosis. IMPRESSION: No evidence of acute disease. Electronically Signed   By: Monte Fantasia M.D.   On: 09/29/2019 04:15   Dg Hand Complete Left  Result Date: 09/29/2019 CLINICAL DATA:  Syncope and fall with left hand swelling EXAM: LEFT HAND - COMPLETE 3+ VIEW COMPARISON:  None. FINDINGS: No true AP projection. No evidence of acute fracture or dislocation. 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 Generalized osteopenia. IMPRESSION: 1. No acute finding. 2. A true AP projection was not obtained, recommend follow-up if ongoing clinical suspicion for fracture. Electronically Signed   By: Monte Fantasia M.D.   On: 09/29/2019 04:18   Ct Maxillofacial Wo Cm  Result Date: 09/29/2019 CLINICAL DATA:  Syncope with head injury. Headache and eyebrow laceration EXAM: CT HEAD WITHOUT CONTRAST CT MAXILLOFACIAL WITHOUT CONTRAST CT CERVICAL SPINE WITHOUT CONTRAST TECHNIQUE: Multidetector CT imaging of the head, cervical spine, and maxillofacial structures were performed using the standard protocol without intravenous contrast. Multiplanar CT image reconstructions of the cervical spine and maxillofacial structures were also generated. COMPARISON:  Head CT 09/17/2018 FINDINGS: CT HEAD FINDINGS Brain: Small  area of subarachnoid hemorrhage along the left frontal convexity, most convincing on coronal and sagittal reformats. 7 mm high-density nodule along the high left frontal convexity, stable from prior. No infarct, hydrocephalus, or shift. Vascular: Peripherally calcified mass along the lower right sylvian fissure measuring 1.1 cm, most consistent with MCA aneurysm based on reformats. Skull: Negative for calvarial fracture.  Left forehead hematoma Other: Critical Value/emergent results were called by telephone at the time of interpretation on 09/29/2019 at 4:41 am to providerJADE  SUNG , who verbally acknowledged these results. CT MAXILLOFACIAL FINDINGS Osseous: Negative for fracture or mandibular dislocation Orbits: No postseptal injury.  Bilateral cataract resection Sinuses: Small high-density fluid level in the right sphenoid sinus without visible underlying fracture. Soft tissues: Hematoma above and lateral to the left orbit with gas from laceration. No opaque foreign body CT CERVICAL SPINE FINDINGS Alignment: No traumatic malalignment Skull base and vertebrae: Negative for fracture Soft tissues and spinal canal: No prevertebral fluid or swelling. No visible canal hematoma. Disc levels:  Generalized degenerative disc narrowing and ridging. Upper chest: Negative IMPRESSION: 1. Small left frontal subarachnoid hemorrhage. 2. Left periorbital contusion and laceration without calvarial fracture. 3. Negative for cervical spine fracture. 4. Known ~ 1.1 cm right sylvian fissure mass compatible with MCA aneurysm. Subcentimeter left frontal meningioma. Electronically Signed   By: Monte Fantasia M.D.   On: 09/29/2019 04:55     IMPRESSION AND PLAN:   *Syncope.  Likely orthostatic.  She was also started on Lasix 3 days back and presently lower especially swelling has resolved.  Will check orthostatic vitals.  Patient will be admitted to telemetry monitored medical floor.  1 and has been checked twice and normal.  Will replace potassium.  Check echocardiogram.  *Left frontal subarachnoid hemorrhage.  Appreciate neurosurgery input.  Stop Eliquis for 7 days.  No Lovenox or heparin at this point.  CT scan repeated in 6 hours and stable and minute.  She does have a right MCA aneurysm which is on the contralateral side and does not seem to be involved with her present subarachnoid hemorrhage.  She follows at [facility] for this.  *Hypertension.  Continue home medications.  *Paroxysmal atrial fibrillation.  Continue metoprolol and amiodarone.  Stop Eliquis for 7 days.  *Diabetes  mellitus.  Sliding scale insulin ordered.  Diabetic diet.  DVT prophylaxis with SCDs  All the records are reviewed and case discussed with ED provider. Management plans discussed with the patient, family and they are in agreement.  CODE STATUS: DNR/DNI  TOTAL TIME TAKING CARE OF THIS PATIENT: 40 minutes.   Leia Alf Carli Lefevers M.D on 09/29/2019 at 12:13 PM  Between 7am to 6pm - Pager - [phone]  After 6pm go to [email or web address] - password [email or web address] [facility]  Office  [phone]  CC: Primary care physician; Rusty Aus, MD  Note: This dictation was prepared with Dragon dictation along with smaller phrase technology. Any transcriptional errors that result from this process are unintentional.

## 2019-09-29 NOTE — ED Notes (Signed)
Patient is requesting that her jewelry (1 ring) be locked up with security once she arrives to her room 2C #217.   Pt's has 1 yellow colored ring with one large white/clear stone in the middle and two flanges with multiples white/clear stones axfixiated to the main middle stone.

## 2019-09-29 NOTE — ED Notes (Signed)
Patient resting supine on stretcher. Even and non labored respirations noted. Denies needs. Will continue to monitor.

## 2019-09-29 NOTE — Progress Notes (Signed)
Advance care planning  Purpose of Encounter Subarachnoid hemorrhage and syncope  Parties in Attendance Patient  Patients Decisional capacity Alert and oriented.  Able to make medical decisions. Her medical power of attorney is daughter Sevanna Trisdale.  Discussed in detail regarding subarachnoid hemorrhage and syncope.  Treatment plan , prognosis discussed.  All questions answered  CODE STATUS discussed.  Patient wishes to be DNR/DNI.  Orders entered and CODE STATUS changed  DNR/DNI  Time spent - 17 minutes

## 2019-09-29 NOTE — Evaluation (Signed)
Physical Therapy Evaluation Patient Details Name: Alison Richardson MRN: [id number] DOB: October 09, 1933 Today's Date: 09/29/2019   History of Present Illness  presented to ER after fall in home environment, sustaining L eye and L head laceration; admitted for syncope work up and management/monitoring of acute L frontal SAH (stable upon follow up/repeat imaging)  Clinical Impression  Upon evaluation, patient alert and oriented; follows commands and demonstrates good effort with all mobility tasks.  Bilat UE/LE strength and ROM grossly symmetrical and WFL; no focal weakness, sensory or coordination deficits.  Able to complete bed mobility with mod indep; sit/stand, basic transfers and gait (25') without assist device, min assist.  Impaired standing balance evidenced by minimal/no standing functional reach (requires UE support for external stabilization for all movement outside immediate BOS) and by moderate sway/gait deviation with dynamic gait components requiring min assist from therapist for balance correction. Mobility improved to cga with use of RW (175'); improved fluidity and mechanics, improved stability; less deviation/sway with dynamic gait components.  Improved cadence/overall gait speed.  Do recommend continued use of RW at this time.  Patient voices understanding and agreement (has access to RW at home). Would benefit from skilled PT to address above deficits and promote optimal return to PLOF; Recommend transition to [place] upon discharge from acute hospitalization.     Follow Up Recommendations Home health PT    Equipment Recommendations  (has RW)    Recommendations for Other Services       Precautions / Restrictions Precautions Precautions: Fall Restrictions Weight Bearing Restrictions: No      Mobility  Bed Mobility Overal bed mobility: Modified Independent                Transfers Overall transfer level: Needs assistance   Transfers: Sit to/from Stand Sit to Stand:  Min guard;Min assist         General transfer comment: tends to reach for bedrails/countertops for external stabilization  Ambulation/Gait Ambulation/Gait assistance: Min assist Gait Distance (Feet): 25 Feet Assistive device: None       General Gait Details: moderate gait deviation/sway with head turns, min assist for correction  Stairs            Wheelchair Mobility    Modified Rankin (Stroke Patients Only)       Balance Overall balance assessment: Needs assistance Sitting-balance support: No upper extremity supported;Feet supported Sitting balance-Leahy Scale: Good     Standing balance support: No upper extremity supported Standing balance-Leahy Scale: Fair Standing balance comment: limited functional reach outside of immediate BOS; does require unilateral UE support to maintain balance with movemetn outside BOS.  Good awareness and use of compensatory strategy.                             Pertinent Vitals/Pain Pain Assessment: No/denies pain    Home Living Family/patient expects to be discharged to:: Private residence Living Arrangements: Alone   Type of Home: Apartment Home Access: Elevator     Home Layout: One level Home Equipment: Environmental consultant - 2 wheels      Prior Function Level of Independence: Independent               Hand Dominance        Extremity/Trunk Assessment   Upper Extremity Assessment Upper Extremity Assessment: Overall WFL for tasks assessed    Lower Extremity Assessment Lower Extremity Assessment: Overall WFL for tasks assessed(grossly 4/5 throughout; no focal weakness, sensory or coordination deficit  appreciated)       Communication   Communication: No difficulties  Cognition Arousal/Alertness: Awake/alert Behavior During Therapy: WFL for tasks assessed/performed Overall Cognitive Status: Within Functional Limits for tasks assessed                                        General  Comments      Exercises Other Exercises Other Exercises: 65' with RW, cga/close sup-improved fluidity and mechanics, improved stability; less deviation/sway with dynamic gait components.  Improved cadence/overall gait speed.  Do recommend continued use of RW at this time.  Patient voices understanding and agreement (has access to RW at home).   Assessment/Plan    PT Assessment Patient needs continued PT services  PT Problem List Decreased balance;Decreased activity tolerance;Decreased mobility;Decreased safety awareness       PT Treatment Interventions DME instruction;Gait training;Functional mobility training;Therapeutic activities;Therapeutic exercise;Balance training;Patient/family education    PT Goals (Current goals can be found in the Care Plan section)  Acute Rehab PT Goals Patient Stated Goal: to return home PT Goal Formulation: With patient Time For Goal Achievement: 10/13/19 Potential to Achieve Goals: Good    Frequency Min 2X/week   Barriers to discharge        Co-evaluation               AM-PAC PT "6 Clicks" Mobility  Outcome Measure Help needed turning from your back to your side while in a flat bed without using bedrails?: None Help needed moving from lying on your back to sitting on the side of a flat bed without using bedrails?: None Help needed moving to and from a bed to a chair (including a wheelchair)?: None Help needed standing up from a chair using your arms (e.g., wheelchair or bedside chair)?: A Little Help needed to walk in hospital room?: A Little Help needed climbing 3-5 steps with a railing? : A Little 6 Click Score: 21    End of Session Equipment Utilized During Treatment: Gait belt Activity Tolerance: Patient tolerated treatment well Patient left: in bed;with call bell/phone within reach;with bed alarm set Nurse Communication: Mobility status PT Visit Diagnosis: Muscle weakness (generalized) (M62.81);Difficulty in walking, not  elsewhere classified (R26.2)    Time: [id number] PT Time Calculation (min) (ACUTE ONLY): 17 min   Charges:   PT Evaluation $PT Eval Moderate Complexity: 1 Mod PT Treatments $Gait Training: 8-22 mins        Dajon Lazar H. Owens Shark, PT, DPT, NCS 09/29/19, 3:45 PM [phone]

## 2019-09-29 NOTE — ED Provider Notes (Signed)
[facility] Emergency Department Provider Note   ____________________________________________   First MD Initiated Contact with Patient 09/29/19 [phone]     (approximate)  I have reviewed the triage vital signs and the nursing notes.   HISTORY  Chief Complaint Loss of Consciousness, Head Injury, and Laceration    HPI Alison Richardson is a 85 y.o. female brought to the ED via EMS from [place] with a chief complaint of syncope and fall with facial laceration.  Patient reports having a bowel movement, stood up to walk back to her bed when she felt lightheaded and had a syncopal episode.  She struck her left eyebrow and presents with laceration.  Patient takes Eliquis for atrial fibrillation.  Complains of headache.  Denies recent fever, cough, chest pain, shortness of breath, abdominal pain, nausea or vomiting.       Past Medical History:  Diagnosis Date   Arrhythmia    Atrial flutter ([place]) 01/2018   new onset    Basal cell carcinoma of back    Basal cell carcinoma of lip    Cerebral aneurysm    followed by [facility]   DDD (degenerative disc disease), lumbar    superior plate depression, L3 08/18/2014   Diabetes mellitus type II, controlled ([place])    Diverticulosis    Dysthymia    depression   GERD (gastroesophageal reflux disease)    History of meniscal tear    Hyperlipidemia    Hypertension    Mild pulmonary hypertension ([place])    Overactive bladder    Seasonal allergic rhinitis     Patient Active Problem List   Diagnosis Date Noted   Diabetes mellitus type 2 in nonobese ([place]) 01/21/2019   Dyslipidemia 01/21/2019   PSVT (paroxysmal supraventricular tachycardia) ([place]) 01/21/2019   Tenosynovitis of foot 01/21/2019   PAD (peripheral artery disease) ([place]) 01/06/2019   Preop testing 01/06/2019   DM type 2 with diabetic mixed hyperlipidemia ([place]) 09/08/2018   Peripheral neuropathy, idiopathic 09/08/2018   Anxiety  disorder 06/19/2018   Depression, major, single episode, mild ([place]) 06/19/2018   Paroxysmal atrial fibrillation ([place]) 06/19/2018   Atrial flutter ([place]) 06/19/2018   Pure hypercholesterolemia 06/19/2018   Vitamin B12 deficiency 06/19/2018   Insomnia 06/19/2018   Protein-calorie malnutrition ([place]) 06/19/2018   Type 2 diabetes mellitus with diabetic neuropathy ([place]) 06/19/2018   Knee pain 06/19/2018   Cognitive impairment, mild, so stated 06/19/2018   Osteoarthritis of right knee 06/19/2018   Benign essential hypertension 06/19/2018   Hypothyroidism 06/19/2018   Overactive bladder 06/19/2018   Generalized muscle weakness 06/19/2018   Difficulty in walking, not elsewhere classified 06/19/2018   Major depression in remission ([place]) 06/18/2018   Primary osteoarthritis involving multiple joints 06/18/2018   Mixed hyperlipidemia 11/01/2015   Cerebral aneurysm 04/29/2013    Past Surgical History:  Procedure Laterality Date   APPENDECTOMY  1946   BASAL CELL CARCINOMA EXCISION     2006 and 2009 removed from back and lip   CARDIOVASCULAR STRESS TEST  2015   nuclear cardiac stress test negative for ischemia - Dr. Satira Sark   CATARACT EXTRACTION, BILATERAL  2006   COLONOSCOPY  2014   [facility]   INJECTION KNEE Left 05/2015   REPLACEMENT TOTAL KNEE Left 09/06/2016   Dr. Barnet Pall   TONSILLECTOMY AND ADENOIDECTOMY  1953   TOTAL ABDOMINAL HYSTERECTOMY  1986   [facility]    Prior to Admission medications   Medication Sig Start Date End Date Taking?  Authorizing Provider  acetaminophen (TYLENOL) 500 MG tablet Take 500 mg by mouth every 6 (six) hours as needed.    [provider]  amiodarone (PACERONE) 200 MG tablet  01/02/19   [provider]  apixaban (ELIQUIS) 5 MG TABS tablet Take 5 mg by mouth 2 (two) times daily.    [provider]  atorvastatin (LIPITOR) 10 MG tablet Take 10 mg by mouth daily.    [provider]  donepezil (ARICEPT) 10 MG tablet Take 10 mg by mouth at bedtime.    [provider]  gabapentin (NEURONTIN) 100 MG capsule Take 100 mg by mouth at bedtime.    [provider]  meloxicam (MOBIC) 15 MG tablet Take 1 tablet (15 mg total) by mouth daily. 01/21/19   Hyatt, Max T, DPM  metFORMIN (GLUCOPHAGE) 500 MG tablet Take 500 mg by mouth 2 (two) times daily with a meal.    [provider]  metoprolol succinate (TOPROL-XL) 100 MG 24 hr tablet Take 150 mg by mouth daily. 1 and 1/2 tabs Take with or immediately following a meal.    [provider]  metoprolol tartrate (LOPRESSOR) 25 MG tablet Take 25 mg by mouth every 8 (eight) hours as needed. tachycardia- heart rate >110    [provider]  oxybutynin (DITROPAN) 5 MG tablet Take 5 mg by mouth 2 (two) times daily.    [provider]  potassium chloride (K-DUR) 10 MEQ tablet Take 10 mEq by mouth daily.    [provider]  venlafaxine XR (EFFEXOR-XR) 150 MG 24 hr capsule Take 150 mg by mouth daily with breakfast.    [provider]    Allergies Patient has no known allergies.  Family History  Problem Relation Age of Onset   Hypertension Mother    CAD Father     Social History Social History   Tobacco Use   Smoking status: Never Smoker   Smokeless tobacco: Never Used  Substance Use Topics   Alcohol use: Not Currently   Drug use: Not on file    Review of Systems  Constitutional: No fever/chills Eyes: No visual changes. ENT: Positive for facial laceration.  No sore throat. Cardiovascular: Denies chest pain. Respiratory: Denies shortness of breath. Gastrointestinal: No abdominal pain.  No nausea, no vomiting.  No diarrhea.  No constipation. Genitourinary: Negative for dysuria. Musculoskeletal: Negative for back pain. Skin: Negative for rash. Neurological: Positive for syncope.  Negative for headaches, focal weakness or  numbness.   ____________________________________________   PHYSICAL EXAM:  VITAL SIGNS: ED Triage Vitals  Enc Vitals Group     BP 09/29/19 0342 (!) 155/96     Pulse Rate 09/29/19 0342 67     Resp 09/29/19 0342 20     Temp 09/29/19 0342 (!) 97.5 F (36.4 C)     Temp Source 09/29/19 0342 Oral     SpO2 09/29/19 0342 98 %     Weight 09/29/19 0344 170 lb (77.1 kg)     Height 09/29/19 0344 5\' 6"  (1.676 m)     Head Circumference --      Peak Flow --      Pain Score 09/29/19 0344 6     Pain Loc --      Pain Edu? --      Excl. in [address]? --     Constitutional: Alert and oriented.  Elderly appearing and in mild acute distress. Eyes: Approximately 2 cm laceration above left eyebrow with clot.  No active  bleeding.  Conjunctivae are normal. PERRL. EOMI. developing periorbital hematoma. Head: Hematoma. Nose: Atraumatic. Mouth/Throat: Mucous membranes are moist.  No dental malocclusion. Neck: No stridor.  No cervical spine tenderness to palpation. Cardiovascular: Normal rate, regular rhythm. Grossly normal heart sounds.  Good peripheral circulation. Respiratory: Normal respiratory effort.  No retractions. Lungs CTAB. Gastrointestinal: Soft and nontender to light or deep palpation. No distention. No abdominal bruits. No CVA tenderness. Musculoskeletal: No spinal tenderness to palpation.  Pelvis is stable.  Bruising to dorsum of left hand.  Rings removed from left fourth digit.  No lower extremity tenderness nor edema.  No joint effusions. Neurologic:  Normal speech and language. No gross focal neurologic deficits are appreciated.  Skin:  Skin is warm, dry and intact. No rash noted. Psychiatric: Mood and affect are normal. Speech and behavior are normal.  ____________________________________________   LABS (all labs ordered are listed, but only abnormal results are displayed)  Labs Reviewed  CBC WITH DIFFERENTIAL/PLATELET - Abnormal; Notable for the following components:      Result Value    WBC 13.5 (*)    RBC 5.14 (*)    Hemoglobin 15.9 (*)    Lymphs Abs 4.6 (*)    All other components within normal limits  COMPREHENSIVE METABOLIC PANEL - Abnormal; Notable for the following components:   Potassium 3.2 (*)    Chloride 94 (*)    Glucose, Bld 264 (*)    AST 51 (*)    ALT 50 (*)    Anion gap 17 (*)    All other components within normal limits  SARS CORONAVIRUS 2 (TAT 6-24 HRS)  URINALYSIS, COMPLETE (UACMP) WITH MICROSCOPIC  TROPONIN I (HIGH SENSITIVITY)  TROPONIN I (HIGH SENSITIVITY)   ____________________________________________  EKG  ED ECG REPORT I, Finlee Milo J, the attending physician, personally viewed and interpreted this ECG.   Date: 09/29/2019  EKG Time: 0355  Rate: 68  Rhythm: normal EKG, normal sinus rhythm  Axis: LAD  Intervals:left bundle branch block  ST&T Change: Nonspecific  ____________________________________________  RADIOLOGY  ED MD interpretation: Small left frontal SAH; otherwise unremarkable scans and x-rays  Official radiology report(s): Ct Head Wo Contrast  Result Date: 09/29/2019 CLINICAL DATA:  Syncope with head injury. Headache and eyebrow laceration EXAM: CT HEAD WITHOUT CONTRAST CT MAXILLOFACIAL WITHOUT CONTRAST CT CERVICAL SPINE WITHOUT CONTRAST TECHNIQUE: Multidetector CT imaging of the head, cervical spine, and maxillofacial structures were performed using the standard protocol without intravenous contrast. Multiplanar CT image reconstructions of the cervical spine and maxillofacial structures were also generated. COMPARISON:  Head CT 09/17/2018 FINDINGS: CT HEAD FINDINGS Brain: Small area of subarachnoid hemorrhage along the left frontal convexity, most convincing on coronal and sagittal reformats. 7 mm high-density nodule along the high left frontal convexity, stable from prior. No infarct, hydrocephalus, or shift. Vascular: Peripherally calcified mass along the lower right sylvian fissure measuring 1.1 cm, most consistent with  MCA aneurysm based on reformats. Skull: Negative for calvarial fracture.  Left forehead hematoma Other: Critical Value/emergent results were called by telephone at the time of interpretation on 09/29/2019 at 4:41 am to providerJADE Dorean Hiebert , who verbally acknowledged these results. CT MAXILLOFACIAL FINDINGS Osseous: Negative for fracture or mandibular dislocation Orbits: No postseptal injury.  Bilateral cataract resection Sinuses: Small high-density fluid level in the right sphenoid sinus without visible underlying fracture. Soft tissues: Hematoma above and lateral to the left orbit with gas from laceration. No opaque foreign body CT CERVICAL SPINE FINDINGS Alignment: No traumatic malalignment Skull base and vertebrae: Negative  for fracture Soft tissues and spinal canal: No prevertebral fluid or swelling. No visible canal hematoma. Disc levels:  Generalized degenerative disc narrowing and ridging. Upper chest: Negative IMPRESSION: 1. Small left frontal subarachnoid hemorrhage. 2. Left periorbital contusion and laceration without calvarial fracture. 3. Negative for cervical spine fracture. 4. Known ~ 1.1 cm right sylvian fissure mass compatible with MCA aneurysm. Subcentimeter left frontal meningioma. Electronically Signed   By: Monte Fantasia M.D.   On: 09/29/2019 04:55   Ct Cervical Spine Wo Contrast  Result Date: 09/29/2019 CLINICAL DATA:  Syncope with head injury. Headache and eyebrow laceration EXAM: CT HEAD WITHOUT CONTRAST CT MAXILLOFACIAL WITHOUT CONTRAST CT CERVICAL SPINE WITHOUT CONTRAST TECHNIQUE: Multidetector CT imaging of the head, cervical spine, and maxillofacial structures were performed using the standard protocol without intravenous contrast. Multiplanar CT image reconstructions of the cervical spine and maxillofacial structures were also generated. COMPARISON:  Head CT 09/17/2018 FINDINGS: CT HEAD FINDINGS Brain: Small area of subarachnoid hemorrhage along the left frontal convexity, most  convincing on coronal and sagittal reformats. 7 mm high-density nodule along the high left frontal convexity, stable from prior. No infarct, hydrocephalus, or shift. Vascular: Peripherally calcified mass along the lower right sylvian fissure measuring 1.1 cm, most consistent with MCA aneurysm based on reformats. Skull: Negative for calvarial fracture.  Left forehead hematoma Other: Critical Value/emergent results were called by telephone at the time of interpretation on 09/29/2019 at 4:41 am to providerJADE Quron Ruddy , who verbally acknowledged these results. CT MAXILLOFACIAL FINDINGS Osseous: Negative for fracture or mandibular dislocation Orbits: No postseptal injury.  Bilateral cataract resection Sinuses: Small high-density fluid level in the right sphenoid sinus without visible underlying fracture. Soft tissues: Hematoma above and lateral to the left orbit with gas from laceration. No opaque foreign body CT CERVICAL SPINE FINDINGS Alignment: No traumatic malalignment Skull base and vertebrae: Negative for fracture Soft tissues and spinal canal: No prevertebral fluid or swelling. No visible canal hematoma. Disc levels:  Generalized degenerative disc narrowing and ridging. Upper chest: Negative IMPRESSION: 1. Small left frontal subarachnoid hemorrhage. 2. Left periorbital contusion and laceration without calvarial fracture. 3. Negative for cervical spine fracture. 4. Known ~ 1.1 cm right sylvian fissure mass compatible with MCA aneurysm. Subcentimeter left frontal meningioma. Electronically Signed   By: Monte Fantasia M.D.   On: 09/29/2019 04:55   Dg Chest Port 1 View  Result Date: 09/29/2019 CLINICAL DATA:  Syncope EXAM: PORTABLE CHEST 1 VIEW COMPARISON:  10/30/2019 FINDINGS: Normal heart size. Mild aortic tortuosity. There is no edema, consolidation, effusion, or pneumothorax. Generalized osteopenia with mild levoscoliosis. IMPRESSION: No evidence of acute disease. Electronically Signed   By: Monte Fantasia M.D.    On: 09/29/2019 04:15   Dg Hand Complete Left  Result Date: 09/29/2019 CLINICAL DATA:  Syncope and fall with left hand swelling EXAM: LEFT HAND - COMPLETE 3+ VIEW COMPARISON:  None. FINDINGS: No true AP projection. No evidence of acute fracture or dislocation. Advanced first [place] osteoarthritis. Generalized osteopenia. IMPRESSION: 1. No acute finding. 2. A true AP projection was not obtained, recommend follow-up if ongoing clinical suspicion for fracture. Electronically Signed   By: Monte Fantasia M.D.   On: 09/29/2019 04:18   Ct Maxillofacial Wo Cm  Result Date: 09/29/2019 CLINICAL DATA:  Syncope with head injury. Headache and eyebrow laceration EXAM: CT HEAD WITHOUT CONTRAST CT MAXILLOFACIAL WITHOUT CONTRAST CT CERVICAL SPINE WITHOUT CONTRAST TECHNIQUE: Multidetector CT imaging of the head, cervical spine, and maxillofacial structures were performed using the standard protocol without  intravenous contrast. Multiplanar CT image reconstructions of the cervical spine and maxillofacial structures were also generated. COMPARISON:  Head CT 09/17/2018 FINDINGS: CT HEAD FINDINGS Brain: Small area of subarachnoid hemorrhage along the left frontal convexity, most convincing on coronal and sagittal reformats. 7 mm high-density nodule along the high left frontal convexity, stable from prior. No infarct, hydrocephalus, or shift. Vascular: Peripherally calcified mass along the lower right sylvian fissure measuring 1.1 cm, most consistent with MCA aneurysm based on reformats. Skull: Negative for calvarial fracture.  Left forehead hematoma Other: Critical Value/emergent results were called by telephone at the time of interpretation on 09/29/2019 at 4:41 am to providerJADE Jodene Polyak , who verbally acknowledged these results. CT MAXILLOFACIAL FINDINGS Osseous: Negative for fracture or mandibular dislocation Orbits: No postseptal injury.  Bilateral cataract resection Sinuses: Small high-density fluid level in the right sphenoid  sinus without visible underlying fracture. Soft tissues: Hematoma above and lateral to the left orbit with gas from laceration. No opaque foreign body CT CERVICAL SPINE FINDINGS Alignment: No traumatic malalignment Skull base and vertebrae: Negative for fracture Soft tissues and spinal canal: No prevertebral fluid or swelling. No visible canal hematoma. Disc levels:  Generalized degenerative disc narrowing and ridging. Upper chest: Negative IMPRESSION: 1. Small left frontal subarachnoid hemorrhage. 2. Left periorbital contusion and laceration without calvarial fracture. 3. Negative for cervical spine fracture. 4. Known ~ 1.1 cm right sylvian fissure mass compatible with MCA aneurysm. Subcentimeter left frontal meningioma. Electronically Signed   By: Monte Fantasia M.D.   On: 09/29/2019 04:55    ____________________________________________   PROCEDURES  Procedure(s) performed (including Critical Care):  Marland KitchenMarland KitchenLaceration Repair  Date/Time: 09/29/2019 3:50 AM Performed by: Paulette Blanch, MD Authorized by: Paulette Blanch, MD   Consent:    Consent obtained:  Verbal   Consent given by:  Patient   Risks discussed:  Infection, pain, poor cosmetic result and poor wound healing Anesthesia (see MAR for exact dosages):    Anesthesia method:  None Laceration details:    Location:  Face   Face location:  L eyebrow   Length (cm):  2   Depth (mm):  2 Repair type:    Repair type:  Simple Exploration:    Hemostasis achieved with:  Direct pressure   Wound exploration: entire depth of wound probed and visualized     Contaminated: no   Treatment:    Area cleansed with:  Saline   Amount of cleaning:  Standard   Irrigation solution:  Sterile saline   Visualized foreign bodies/material removed: no   Skin repair:    Repair method:  Steri-Strips and tissue adhesive   Number of Steri-Strips:  3 Approximation:    Approximation:  Close Post-procedure details:    Dressing:  Open (no dressing)   Patient  tolerance of procedure:  Tolerated well, no immediate complications     ____________________________________________   INITIAL IMPRESSION / ASSESSMENT AND PLAN / ED COURSE  As part of my medical decision making, I reviewed the following data within the [facility] notes reviewed and incorporated, Labs reviewed, EKG interpreted, Old chart reviewed, Radiograph reviewed and Notes from prior ED visits     Alison Richardson was evaluated in Emergency Department on 09/29/2019 for the symptoms described in the history of present illness. She was evaluated in the context of the global COVID-19 pandemic, which necessitated consideration that the patient might be at risk for infection with the SARS-CoV-2 virus that causes COVID-19. Institutional protocols and algorithms that pertain  to the evaluation of patients at risk for COVID-19 are in a state of rapid change based on information released by regulatory bodies including the CDC and federal and state organizations. These policies and algorithms were followed during the patient's care in the ED.    85 year old female, DNR, who presents status post syncopal episode with facial laceration.  Differential diagnosis includes but is not limited to ACS, CVA, ICH, infectious, metabolic etiologies, etc.  Decision was made to Dermabond and apply Steri-Strips over laceration as patient is on Eliquis and concern for rebleeding if sutures applied.  Will obtain cardiac work-up, image head/cervical spine/maxillofacial.  Anticipate hospitalization for syncope unless patient has something on her imaging studies which requires transfer.   Clinical Course as of Sep 28 654  Tue Sep 29, 2019  0625 Patient sleeping in no acute distress.  Spoke with Dr. Lacinda Axon from neurosurgery regarding small subarachnoid hemorrhage seen on CT scan.  Agrees with repeat scan in 6 hours.  Anticipate hospitalization for syncope if bleeding has not expanded.  He will see her  in consultation.   [JS]  [id number] Care transferred to Dr. Corky Downs pending repeat CT Head and admission vs transfer for syncope.   [JS]    Clinical Course User Index [JS] Paulette Blanch, MD     ____________________________________________   FINAL CLINICAL IMPRESSION(S) / ED DIAGNOSES  Final diagnoses:  Injury of head, initial encounter  Syncope and collapse  SAH (subarachnoid hemorrhage) [facility])     ED Discharge Orders    None       Note:  This document was prepared using Dragon voice recognition software and may include unintentional dictation errors.   Paulette Blanch, MD 09/29/19 [phone]

## 2019-09-29 NOTE — ED Provider Notes (Signed)
Repeat scan no change, will admit to the hospital service.  Seen by Dr. Rocky Link, MD 09/29/19 [phone]

## 2019-09-29 NOTE — Consult Note (Signed)
Neurosurgery-New Consultation Evaluation 09/29/2019 Alison Richardson [id number]  Identifying Statement: Alison Richardson is a 85 y.o. female from [place] [id number] with syncopal fall and concern for subarachnoid hemorrhage  Physician Requesting Consultation: [facility]  History of Present Illness: Alison Richardson is here after a fall where she says she stood from the toilet and was unable to gain her balance and fell hitting the left side of her face.  She did lose consciousness as she does not recall events after that.  She does have a history of a right-sided calcified MCA aneurysm that has not been previously treated.  She denies any symptoms prior to the fall but since the fall, she does comment on an occipital headache.  She denies any changes in her vision, speech, strength.  She is on Eliquis for a cardiac arrhythmia.  She has been admitted for the syncopal evaluation.  We are consulted for the concern for the small amount of subarachnoid hemorrhage.  Past Medical History:  Past Medical History:  Diagnosis Date  . Arrhythmia   . Atrial flutter ([place]) 01/2018   new onset   . Basal cell carcinoma of back   . Basal cell carcinoma of lip   . Cerebral aneurysm    followed by [facility]  . DDD (degenerative disc disease), lumbar    superior plate depression, L3 08/18/2014  . Diabetes mellitus type II, controlled ([place])   . Diverticulosis   . Dysthymia    depression  . GERD (gastroesophageal reflux disease)   . History of meniscal tear   . Hyperlipidemia   . Hypertension   . Mild pulmonary hypertension ([place])   . Overactive bladder   . Seasonal allergic rhinitis     Social History: Social History   Socioeconomic History  . Marital status: Married    Spouse name: Not on file  . Number of children: 5  . Years of education: Not on file  . Highest education level: Not on file  Occupational History  . Not on file  Social Needs  . Financial resource strain: Not  on file  . Food insecurity    Worry: Not on file    Inability: Not on file  . Transportation needs    Medical: Not on file    Non-medical: Not on file  Tobacco Use  . Smoking status: Never Smoker  . Smokeless tobacco: Never Used  Substance and Sexual Activity  . Alcohol use: Not Currently  . Drug use: Not on file  . Sexual activity: Not on file  Lifestyle  . Physical activity    Days per week: Not on file    Minutes per session: Not on file  . Stress: Not on file  Relationships  . Social Herbalist on phone: Not on file    Gets together: Not on file    Attends religious service: Not on file    Active member of club or organization: Not on file    Attends meetings of clubs or organizations: Not on file    Relationship status: Not on file  . Intimate partner violence    Fear of current or ex partner: Not on file    Emotionally abused: Not on file    Physically abused: Not on file    Forced sexual activity: Not on file  Other Topics Concern  . Not on file  Social History Narrative  . Not on file   Family History: Family History  Problem Relation Age of  Onset  . Hypertension Mother   . CAD Father     Review of Systems:  Review of Systems - General ROS: Negative Psychological ROS: Negative Ophthalmic ROS: Negative ENT ROS: Negative Hematological and Lymphatic ROS: Negative  Endocrine ROS: Negative Respiratory ROS: Negative Cardiovascular ROS: Negative Gastrointestinal ROS: Negative Genito-Urinary ROS: Negative Musculoskeletal ROS: Negative Neurological ROS: Positive for headache Dermatological ROS: Negative  Physical Exam: BP (!) 147/81   Pulse 78   Temp (!) 97.5 F (36.4 C) (Oral)   Resp (!) 23   Ht 5\' 6"  (1.676 m)   Wt 77.1 kg   SpO2 96%   BMI 27.44 kg/m  Body mass index is 27.44 kg/m. Body surface area is 1.89 meters squared. General appearance: Alert, cooperative, in no acute distress Head: There is ecchymosis around the left  periorbital area with swelling and  supraorbital laceration that has been repaired Eyes: Normal sclera, EOM intact Oropharynx: Moist without lesions Neck: Supple Ext: No edema in LE bilaterally, warm extremities  Neurologic exam:  Mental status: alertness: alert, orientation: person, place, time, affect: normal Speech: fluent and clear, naming and repetition are intact Cranial nerves:  II: Visual fields are full by confrontation, no ptosis III/IV/VI: extra-ocular motions intact bilaterally V/VII:no evidence of facial droop or weakness VIII: hearing normal XI: trapezius strength symmetric,  sternocleidomastoid strength symmetric XII: tongue strength symmetric  Motor:strength symmetric 5/5, normal muscle mass and tone in all extremities  Sensory: intact to light touch in all extremities Gait: Not tested given ongoing evaluation  Laboratory: Results for orders placed or performed during the hospital encounter of 09/29/19  CBC with Differential  Result Value Ref Range   WBC 13.5 (H) 4.0 - 10.5 K/uL   RBC 5.14 (H) 3.87 - 5.11 MIL/uL   Hemoglobin 15.9 (H) 12.0 - 15.0 g/dL   HCT 45.3 36.0 - 46.0 %   MCV 88.1 80.0 - 100.0 fL   MCH 30.9 26.0 - 34.0 pg   MCHC 35.1 30.0 - 36.0 g/dL   RDW 12.1 11.5 - 15.5 %   Platelets 284 150 - 400 K/uL   nRBC 0.0 0.0 - 0.2 %   Neutrophils Relative % 55 %   Neutro Abs 7.5 1.7 - 7.7 K/uL   Lymphocytes Relative 34 %   Lymphs Abs 4.6 (H) 0.7 - 4.0 K/uL   Monocytes Relative 7 %   Monocytes Absolute 1.0 0.1 - 1.0 K/uL   Eosinophils Relative 2 %   Eosinophils Absolute 0.3 0.0 - 0.5 K/uL   Basophils Relative 1 %   Basophils Absolute 0.1 0.0 - 0.1 K/uL   Immature Granulocytes 1 %   Abs Immature Granulocytes 0.07 0.00 - 0.07 K/uL  Comprehensive metabolic panel  Result Value Ref Range   Sodium 136 135 - 145 mmol/L   Potassium 3.2 (L) 3.5 - 5.1 mmol/L   Chloride 94 (L) 98 - 111 mmol/L   CO2 25 22 - 32 mmol/L   Glucose, Bld 264 (H) 70 - 99 mg/dL   BUN  19 8 - 23 mg/dL   Creatinine, Ser 0.84 0.44 - 1.00 mg/dL   Calcium 9.7 8.9 - 10.3 mg/dL   Total Protein 8.0 6.5 - 8.1 g/dL   Albumin 4.6 3.5 - 5.0 g/dL   AST 51 (H) 15 - 41 U/L   ALT 50 (H) 0 - 44 U/L   Alkaline Phosphatase 117 38 - 126 U/L   Total Bilirubin 0.8 0.3 - 1.2 mg/dL   GFR calc non Af Amer >60 >  60 mL/min   GFR calc Af Amer >60 >60 mL/min   Anion gap 17 (H) 5 - 15  Urinalysis, Complete w Microscopic  Result Value Ref Range   Color, Urine YELLOW (A) YELLOW   APPearance HAZY (A) CLEAR   Specific Gravity, Urine 1.014 1.005 - 1.030   pH 6.0 5.0 - 8.0   Glucose, UA >=500 (A) NEGATIVE mg/dL   Hgb urine dipstick SMALL (A) NEGATIVE   Bilirubin Urine NEGATIVE NEGATIVE   Ketones, ur 5 (A) NEGATIVE mg/dL   Protein, ur NEGATIVE NEGATIVE mg/dL   Nitrite NEGATIVE NEGATIVE   Leukocytes,Ua SMALL (A) NEGATIVE   RBC / HPF 6-10 0 - 5 RBC/hpf   WBC, UA 11-20 0 - 5 WBC/hpf   Bacteria, UA NONE SEEN NONE SEEN   Squamous Epithelial / LPF 0-5 0 - 5   Hyaline Casts, UA PRESENT   Troponin I (High Sensitivity)  Result Value Ref Range   Troponin I (High Sensitivity) 7 <18 ng/L   I personally reviewed labs  Imaging: CT Head: 1. Small left frontal subarachnoid hemorrhage. 2. Left periorbital contusion and laceration without calvarial Fracture. 3. Negative for cervical spine fracture. 4. Known ~ 1.1 cm right sylvian fissure mass compatible with MCAaneurysm. Subcentimeter left frontal meningioma.  Impression/Plan:  Ms. Berte is here for evaluation after syncopal fall with a CT scan concerning for a very small left frontal subarachnoid hemorrhage.  I reviewed the images in detail and there is hyperdensity is minute and could be related to artifact but I would treat it as a traumatic SAH given the history.  I do recommend holding the Eliquis for 7 days.  I do recommend repeating a scan in 6 hours to ensure there is no development of progressive hemorrhage.  She is currently a DNR and I do think  she can be evaluated at [facility].  The aneurysm that is known is on the contralateral side and unrelated to this.  If repeat CT is stable, no further imaging or follow-up is needed from a neurosurgical perspective   1.  Diagnosis: Syncopal fall with small amount of traumatic subarachnoid hemorrhage  2.  Plan -Repeat CT in 6 hours -Hold Eliquis for 7 days -If stable CT, can perform DVT chemoprophylaxis in 48 hours

## 2019-09-30 DIAGNOSIS — Z90710 Acquired absence of both cervix and uterus: Secondary | ICD-10-CM | POA: Diagnosis not present

## 2019-09-30 DIAGNOSIS — I272 Pulmonary hypertension, unspecified: Secondary | ICD-10-CM | POA: Diagnosis present

## 2019-09-30 DIAGNOSIS — I480 Paroxysmal atrial fibrillation: Secondary | ICD-10-CM | POA: Diagnosis present

## 2019-09-30 DIAGNOSIS — W19XXXA Unspecified fall, initial encounter: Secondary | ICD-10-CM | POA: Diagnosis present

## 2019-09-30 DIAGNOSIS — Z9842 Cataract extraction status, left eye: Secondary | ICD-10-CM | POA: Diagnosis not present

## 2019-09-30 DIAGNOSIS — Z7901 Long term (current) use of anticoagulants: Secondary | ICD-10-CM | POA: Diagnosis not present

## 2019-09-30 DIAGNOSIS — Z79899 Other long term (current) drug therapy: Secondary | ICD-10-CM | POA: Diagnosis not present

## 2019-09-30 DIAGNOSIS — S066X9A Traumatic subarachnoid hemorrhage with loss of consciousness of unspecified duration, initial encounter: Secondary | ICD-10-CM | POA: Diagnosis present

## 2019-09-30 DIAGNOSIS — Z85828 Personal history of other malignant neoplasm of skin: Secondary | ICD-10-CM | POA: Diagnosis not present

## 2019-09-30 DIAGNOSIS — S01112A Laceration without foreign body of left eyelid and periocular area, initial encounter: Secondary | ICD-10-CM | POA: Diagnosis present

## 2019-09-30 DIAGNOSIS — I10 Essential (primary) hypertension: Secondary | ICD-10-CM | POA: Diagnosis present

## 2019-09-30 DIAGNOSIS — Z791 Long term (current) use of non-steroidal anti-inflammatories (NSAID): Secondary | ICD-10-CM | POA: Diagnosis not present

## 2019-09-30 DIAGNOSIS — Z79890 Hormone replacement therapy (postmenopausal): Secondary | ICD-10-CM | POA: Diagnosis not present

## 2019-09-30 DIAGNOSIS — Z8249 Family history of ischemic heart disease and other diseases of the circulatory system: Secondary | ICD-10-CM | POA: Diagnosis not present

## 2019-09-30 DIAGNOSIS — I671 Cerebral aneurysm, nonruptured: Secondary | ICD-10-CM | POA: Diagnosis present

## 2019-09-30 DIAGNOSIS — R55 Syncope and collapse: Secondary | ICD-10-CM | POA: Diagnosis present

## 2019-09-30 DIAGNOSIS — K219 Gastro-esophageal reflux disease without esophagitis: Secondary | ICD-10-CM | POA: Diagnosis present

## 2019-09-30 DIAGNOSIS — D320 Benign neoplasm of cerebral meninges: Secondary | ICD-10-CM | POA: Diagnosis present

## 2019-09-30 DIAGNOSIS — E785 Hyperlipidemia, unspecified: Secondary | ICD-10-CM | POA: Diagnosis present

## 2019-09-30 DIAGNOSIS — E876 Hypokalemia: Secondary | ICD-10-CM | POA: Diagnosis present

## 2019-09-30 DIAGNOSIS — Z20828 Contact with and (suspected) exposure to other viral communicable diseases: Secondary | ICD-10-CM | POA: Diagnosis present

## 2019-09-30 DIAGNOSIS — Z96652 Presence of left artificial knee joint: Secondary | ICD-10-CM | POA: Diagnosis present

## 2019-09-30 DIAGNOSIS — I951 Orthostatic hypotension: Secondary | ICD-10-CM | POA: Diagnosis present

## 2019-09-30 DIAGNOSIS — Z23 Encounter for immunization: Secondary | ICD-10-CM | POA: Diagnosis present

## 2019-09-30 DIAGNOSIS — Z9841 Cataract extraction status, right eye: Secondary | ICD-10-CM | POA: Diagnosis not present

## 2019-09-30 DIAGNOSIS — Z66 Do not resuscitate: Secondary | ICD-10-CM | POA: Diagnosis present

## 2019-09-30 LAB — GLUCOSE, CAPILLARY
Glucose-Capillary: 208 mg/dL — ABNORMAL HIGH (ref 70–99)
Glucose-Capillary: 163 mg/dL — ABNORMAL HIGH (ref 70–99)
Glucose-Capillary: 149 mg/dL — ABNORMAL HIGH (ref 70–99)
Glucose-Capillary: 230 mg/dL — ABNORMAL HIGH (ref 70–99)

## 2019-09-30 LAB — CBC
HCT: 37.6 % (ref 36.0–46.0)
Hemoglobin: 13.2 g/dL (ref 12.0–15.0)
MCH: 31.1 pg (ref 26.0–34.0)
MCHC: 35.1 g/dL (ref 30.0–36.0)
MCV: 88.7 fL (ref 80.0–100.0)
Platelets: 215 10*3/uL (ref 150–400)
RBC: 4.24 MIL/uL (ref 3.87–5.11)
RDW: 12.4 % (ref 11.5–15.5)
WBC: 11 10*3/uL — ABNORMAL HIGH (ref 4.0–10.5)
nRBC: 0 % (ref 0.0–0.2)

## 2019-09-30 LAB — BASIC METABOLIC PANEL
Anion gap: 10 (ref 5–15)
BUN: 11 mg/dL (ref 8–23)
CO2: 27 mmol/L (ref 22–32)
Calcium: 8.6 mg/dL — ABNORMAL LOW (ref 8.9–10.3)
Chloride: 102 mmol/L (ref 98–111)
Creatinine, Ser: 0.58 mg/dL (ref 0.44–1.00)
GFR calc Af Amer: >60 mL/min (ref >60)
GFR calc non Af Amer: >60 mL/min (ref >60)
Glucose, Bld: 207 mg/dL — ABNORMAL HIGH (ref 70–99)
Potassium: 2.6 mmol/L — CL (ref 3.5–5.1)
Sodium: 139 mmol/L (ref 135–145)

## 2019-09-30 LAB — POTASSIUM: Potassium: 3.8 mmol/L (ref 3.5–5.1)

## 2019-09-30 LAB — ECHOCARDIOGRAM COMPLETE
Height: 66 in
Weight: 2720 oz

## 2019-09-30 MED ORDER — POTASSIUM CHLORIDE 10 MEQ/100ML IV SOLN
10.0000 meq | Freq: Once | INTRAVENOUS | Status: AC
  Administered 2019-09-30: 10 meq via INTRAVENOUS
  Filled 2019-09-30: qty 100

## 2019-09-30 MED ORDER — INFLUENZA VAC A&B SA ADJ QUAD 0.5 ML IM PRSY
0.5000 mL | PREFILLED_SYRINGE | INTRAMUSCULAR | Status: DC
  Filled 2019-09-30: qty 0.5

## 2019-09-30 MED ORDER — POTASSIUM CHLORIDE CRYS ER 10 MEQ PO TBCR: 10.0000 meq | EXTENDED_RELEASE_TABLET | Freq: Two times a day (BID) | ORAL | Status: DC

## 2019-09-30 MED ORDER — POTASSIUM CHLORIDE CRYS ER 20 MEQ PO TBCR
40.0000 meq | EXTENDED_RELEASE_TABLET | ORAL | Status: AC
  Administered 2019-09-30 (×2): 40 meq via ORAL
  Filled 2019-09-30 (×2): qty 2

## 2019-09-30 MED ORDER — POTASSIUM CHLORIDE 10 MEQ/100ML IV SOLN
10.0000 meq | INTRAVENOUS | Status: DC
  Administered 2019-09-30: 10 meq via INTRAVENOUS
  Filled 2019-09-30: qty 100

## 2019-09-30 NOTE — Consult Note (Signed)
[facility] for Electrolyte Monitoring and Replacement   Recent Labs: Potassium (mmol/L)  Date Value  09/30/2019 2.6 (LL)   Magnesium (mg/dL)  Date Value  09/29/2019 2.4   Calcium (mg/dL)  Date Value  09/30/2019 8.6 (L)   Albumin (g/dL)  Date Value  09/29/2019 4.6   Sodium (mmol/L)  Date Value  09/30/2019 139    Follow-up Potassium (mmol/L): 3.8 mmol/L   Assessment: 85 year old female patient with history of atrial fibrillation on Eliquis, type 2 diabetes mellitus, hypertension, hyperlipidemia, right MCA aneurysm currently hypokalemic.  Prior to the follow-up level the patient received the following: 20 mEq IV KCl and 40 mEq oral KCl  Goal of Therapy:  Electrolytes WNL  Plan:   The final bag of potassium had just completed infusing when this f/u level was drawn, therefore the level will be slightly higher once the drug has had time to distribute  No more potassium replacement is warranted today  F/U potassium level in am  Dallie Piles ,PharmD Clinical Pharmacist 09/30/2019 1:40 PM

## 2019-09-30 NOTE — Progress Notes (Signed)
[facility] NAME: Mallerie Radford    MR#:  [id number]  DATE OF BIRTH:  1933/12/13  SUBJECTIVE:  CHIEF COMPLAINT:   Chief Complaint  Patient presents with  . Loss of Consciousness  . Head Injury  . Laceration  Patient seen and evaluated today Has no headache No dizziness No blurry vision  REVIEW OF SYSTEMS:    ROS  CONSTITUTIONAL: No documented fever. No fatigue, weakness. No weight gain, no weight loss.  EYES: No blurry or double vision.  ENT: No tinnitus. No postnasal drip. No redness of the oropharynx.  RESPIRATORY: No cough, no wheeze, no hemoptysis. No dyspnea.  CARDIOVASCULAR: No chest pain. No orthopnea. No palpitations. No syncope.  GASTROINTESTINAL: No nausea, no vomiting or diarrhea. No abdominal pain. No melena or hematochezia.  GENITOURINARY: No dysuria or hematuria.  ENDOCRINE: No polyuria or nocturia. No heat or cold intolerance.  HEMATOLOGY: No anemia. No bruising. No bleeding.  INTEGUMENTARY: No rashes.  Ecchymosis around left eye MUSCULOSKELETAL: No arthritis. No swelling. No gout.  NEUROLOGIC: No numbness, tingling, or ataxia. No seizure-type activity.  PSYCHIATRIC: No anxiety. No insomnia. No ADD.   DRUG ALLERGIES:  No Known Allergies  VITALS:  Blood pressure 139/70, pulse 61, temperature 98.2 F (36.8 C), temperature source Oral, resp. rate 20, height 5\' 6"  (1.676 m), weight 77.1 kg, SpO2 91 %.  PHYSICAL EXAMINATION:   Physical Exam  GENERAL:  85 y.o.-year-old patient lying in the bed with no acute distress.  EYES: Pupils equal, round, reactive to light and accommodation. No scleral icterus. Extraocular muscles intact.  HEENT: Head normocephalic. Oropharynx and nasopharynx clear.  Ecchymosis around left eye NECK:  Supple, no jugular venous distention. No thyroid enlargement, no tenderness.  LUNGS: Normal breath sounds bilaterally, no wheezing, rales, rhonchi. No use of accessory muscles of  respiration.  CARDIOVASCULAR: S1, S2 normal. No murmurs, rubs, or gallops.  ABDOMEN: Soft, nontender, nondistended. Bowel sounds present. No organomegaly or mass.  EXTREMITIES: No cyanosis, clubbing or edema b/l.    NEUROLOGIC: Cranial nerves II through XII are intact. No focal Motor or sensory deficits b/l.   PSYCHIATRIC: The patient is alert and oriented x 3.  SKIN: ecchymosis around left eye  LABORATORY PANEL:   CBC Recent Labs  Lab 09/30/19 0431  WBC 11.0*  HGB 13.2  HCT 37.6  PLT 215   ------------------------------------------------------------------------------------------------------------------ Chemistries  Recent Labs  Lab 09/29/19 0400 09/29/19 0754 09/30/19 0431  NA 136  --  139  K 3.2*  --  2.6*  CL 94*  --  102  CO2 25  --  27  GLUCOSE 264*  --  207*  BUN 19  --  11  CREATININE 0.84  --  0.58  CALCIUM 9.7  --  8.6*  MG  --  2.4  --   AST 51*  --   --   ALT 50*  --   --   ALKPHOS 117  --   --   BILITOT 0.8  --   --    ------------------------------------------------------------------------------------------------------------------  Cardiac Enzymes No results for input(s): TROPONINI in the last 168 hours. ------------------------------------------------------------------------------------------------------------------  RADIOLOGY:  Ct Head Wo Contrast  Result Date: 09/29/2019 CLINICAL DATA:  Follow-up hemorrhage EXAM: CT HEAD WITHOUT CONTRAST TECHNIQUE: Contiguous axial images were obtained from the base of the skull through the vertex without intravenous contrast. Sagittal and coronal MPR images reconstructed from axial data set. COMPARISON:  Earlier study of 09/29/2019 at 0406 hours FINDINGS:  Brain: Generalized atrophy. Normal ventricular morphology. No midline shift or mass effect. Small vessel chronic ischemic changes of deep cerebral white matter. Small focus of subarachnoid blood at a sulcus at the LEFT frontal lobe unchanged. No new areas of  intracranial hemorrhage or infarction. Vascular: Atherosclerotic calcifications of internal carotid arteries at skull base. Peripherally calcified mass at anterior aspect of RIGHT middle cranial fossa consistent with RIGHT MCA aneurysm 9 x 10 mm unchanged. Skull: Intact.  Small LEFT frontal scalp hematoma. Sinuses/Orbits: Small air-fluid level within the RIGHT sphenoid sinus. Remaining visualized paranasal sinuses and mastoid air cells clear. Other: N/A IMPRESSION: Again identified small focus of subarachnoid hemorrhage at a sulcus in the LEFT frontal lobe. 9 x 10 mm partially calcified RIGHT middle cerebral artery aneurysm. LEFT frontal scalp hematoma. No new intracranial abnormalities. Electronically Signed   By: Lavonia Dana M.D.   On: 09/29/2019 10:15   Ct Head Wo Contrast  Result Date: 09/29/2019 CLINICAL DATA:  Syncope with head injury. Headache and eyebrow laceration EXAM: CT HEAD WITHOUT CONTRAST CT MAXILLOFACIAL WITHOUT CONTRAST CT CERVICAL SPINE WITHOUT CONTRAST TECHNIQUE: Multidetector CT imaging of the head, cervical spine, and maxillofacial structures were performed using the standard protocol without intravenous contrast. Multiplanar CT image reconstructions of the cervical spine and maxillofacial structures were also generated. COMPARISON:  Head CT 09/17/2018 FINDINGS: CT HEAD FINDINGS Brain: Small area of subarachnoid hemorrhage along the left frontal convexity, most convincing on coronal and sagittal reformats. 7 mm high-density nodule along the high left frontal convexity, stable from prior. No infarct, hydrocephalus, or shift. Vascular: Peripherally calcified mass along the lower right sylvian fissure measuring 1.1 cm, most consistent with MCA aneurysm based on reformats. Skull: Negative for calvarial fracture.  Left forehead hematoma Other: Critical Value/emergent results were called by telephone at the time of interpretation on 09/29/2019 at 4:41 am to providerJADE SUNG , who verbally  acknowledged these results. CT MAXILLOFACIAL FINDINGS Osseous: Negative for fracture or mandibular dislocation Orbits: No postseptal injury.  Bilateral cataract resection Sinuses: Small high-density fluid level in the right sphenoid sinus without visible underlying fracture. Soft tissues: Hematoma above and lateral to the left orbit with gas from laceration. No opaque foreign body CT CERVICAL SPINE FINDINGS Alignment: No traumatic malalignment Skull base and vertebrae: Negative for fracture Soft tissues and spinal canal: No prevertebral fluid or swelling. No visible canal hematoma. Disc levels:  Generalized degenerative disc narrowing and ridging. Upper chest: Negative IMPRESSION: 1. Small left frontal subarachnoid hemorrhage. 2. Left periorbital contusion and laceration without calvarial fracture. 3. Negative for cervical spine fracture. 4. Known ~ 1.1 cm right sylvian fissure mass compatible with MCA aneurysm. Subcentimeter left frontal meningioma. Electronically Signed   By: Monte Fantasia M.D.   On: 09/29/2019 04:55   Ct Cervical Spine Wo Contrast  Result Date: 09/29/2019 CLINICAL DATA:  Syncope with head injury. Headache and eyebrow laceration EXAM: CT HEAD WITHOUT CONTRAST CT MAXILLOFACIAL WITHOUT CONTRAST CT CERVICAL SPINE WITHOUT CONTRAST TECHNIQUE: Multidetector CT imaging of the head, cervical spine, and maxillofacial structures were performed using the standard protocol without intravenous contrast. Multiplanar CT image reconstructions of the cervical spine and maxillofacial structures were also generated. COMPARISON:  Head CT 09/17/2018 FINDINGS: CT HEAD FINDINGS Brain: Small area of subarachnoid hemorrhage along the left frontal convexity, most convincing on coronal and sagittal reformats. 7 mm high-density nodule along the high left frontal convexity, stable from prior. No infarct, hydrocephalus, or shift. Vascular: Peripherally calcified mass along the lower right sylvian fissure measuring 1.1  cm,  most consistent with MCA aneurysm based on reformats. Skull: Negative for calvarial fracture.  Left forehead hematoma Other: Critical Value/emergent results were called by telephone at the time of interpretation on 09/29/2019 at 4:41 am to providerJADE SUNG , who verbally acknowledged these results. CT MAXILLOFACIAL FINDINGS Osseous: Negative for fracture or mandibular dislocation Orbits: No postseptal injury.  Bilateral cataract resection Sinuses: Small high-density fluid level in the right sphenoid sinus without visible underlying fracture. Soft tissues: Hematoma above and lateral to the left orbit with gas from laceration. No opaque foreign body CT CERVICAL SPINE FINDINGS Alignment: No traumatic malalignment Skull base and vertebrae: Negative for fracture Soft tissues and spinal canal: No prevertebral fluid or swelling. No visible canal hematoma. Disc levels:  Generalized degenerative disc narrowing and ridging. Upper chest: Negative IMPRESSION: 1. Small left frontal subarachnoid hemorrhage. 2. Left periorbital contusion and laceration without calvarial fracture. 3. Negative for cervical spine fracture. 4. Known ~ 1.1 cm right sylvian fissure mass compatible with MCA aneurysm. Subcentimeter left frontal meningioma. Electronically Signed   By: Monte Fantasia M.D.   On: 09/29/2019 04:55   Dg Chest Port 1 View  Result Date: 09/29/2019 CLINICAL DATA:  Syncope EXAM: PORTABLE CHEST 1 VIEW COMPARISON:  10/30/2019 FINDINGS: Normal heart size. Mild aortic tortuosity. There is no edema, consolidation, effusion, or pneumothorax. Generalized osteopenia with mild levoscoliosis. IMPRESSION: No evidence of acute disease. Electronically Signed   By: Monte Fantasia M.D.   On: 09/29/2019 04:15   Dg Hand Complete Left  Result Date: 09/29/2019 CLINICAL DATA:  Syncope and fall with left hand swelling EXAM: LEFT HAND - COMPLETE 3+ VIEW COMPARISON:  None. FINDINGS: No true AP projection. No evidence of acute fracture or  dislocation. Advanced first [place] osteoarthritis. Generalized osteopenia. IMPRESSION: 1. No acute finding. 2. A true AP projection was not obtained, recommend follow-up if ongoing clinical suspicion for fracture. Electronically Signed   By: Monte Fantasia M.D.   On: 09/29/2019 04:18   Dg Hip Unilat With Pelvis 2-3 Views Left  Result Date: 09/29/2019 CLINICAL DATA:  Status post fall EXAM: DG HIP (WITH OR WITHOUT PELVIS) 2-3V LEFT COMPARISON:  None. FINDINGS: Generalized osteopenia. No fracture or dislocation. No aggressive osseous lesion. IMPRESSION: No acute osseous injury of the left hip. Given the patient's age and osteopenia, if there is persistent clinical concern for an occult hip fracture, a MRI of the hip is recommended for increased sensitivity. Electronically Signed   By: Kathreen Devoid   On: 09/29/2019 16:46   Ct Maxillofacial Wo Cm  Result Date: 09/29/2019 CLINICAL DATA:  Syncope with head injury. Headache and eyebrow laceration EXAM: CT HEAD WITHOUT CONTRAST CT MAXILLOFACIAL WITHOUT CONTRAST CT CERVICAL SPINE WITHOUT CONTRAST TECHNIQUE: Multidetector CT imaging of the head, cervical spine, and maxillofacial structures were performed using the standard protocol without intravenous contrast. Multiplanar CT image reconstructions of the cervical spine and maxillofacial structures were also generated. COMPARISON:  Head CT 09/17/2018 FINDINGS: CT HEAD FINDINGS Brain: Small area of subarachnoid hemorrhage along the left frontal convexity, most convincing on coronal and sagittal reformats. 7 mm high-density nodule along the high left frontal convexity, stable from prior. No infarct, hydrocephalus, or shift. Vascular: Peripherally calcified mass along the lower right sylvian fissure measuring 1.1 cm, most consistent with MCA aneurysm based on reformats. Skull: Negative for calvarial fracture.  Left forehead hematoma Other: Critical Value/emergent results were called by telephone at the time of interpretation  on 09/29/2019 at 4:41 am to providerJADE SUNG , who verbally acknowledged these results.  CT MAXILLOFACIAL FINDINGS Osseous: Negative for fracture or mandibular dislocation Orbits: No postseptal injury.  Bilateral cataract resection Sinuses: Small high-density fluid level in the right sphenoid sinus without visible underlying fracture. Soft tissues: Hematoma above and lateral to the left orbit with gas from laceration. No opaque foreign body CT CERVICAL SPINE FINDINGS Alignment: No traumatic malalignment Skull base and vertebrae: Negative for fracture Soft tissues and spinal canal: No prevertebral fluid or swelling. No visible canal hematoma. Disc levels:  Generalized degenerative disc narrowing and ridging. Upper chest: Negative IMPRESSION: 1. Small left frontal subarachnoid hemorrhage. 2. Left periorbital contusion and laceration without calvarial fracture. 3. Negative for cervical spine fracture. 4. Known ~ 1.1 cm right sylvian fissure mass compatible with MCA aneurysm. Subcentimeter left frontal meningioma. Electronically Signed   By: Monte Fantasia M.D.   On: 09/29/2019 04:55     ASSESSMENT AND PLAN:  85 year old female patient with history of atrial fibrillation on Eliquis, type 2 diabetes mellitus, hypertension, hyperlipidemia, right MCA aneurysm currently under hospitalist service  -Acute hypokalemia Replace potassium IV and orally aggressively Pharmacy to help  -Left frontal subarachnoid hemorrhage secondary to fall No acute intervention recommended by neurosurgery Hold Eliquis for 7 days CT repeated and appears stable Right MCA aneurysm on the contralateral side not appear to be involved in her subarachnoid hemorrhage Follows up with [facility]  -Type 2 diabetes mellitus Diabetic diet with sliding scale coverage with insulin  -Syncope secondary to orthostatic hypotension Diuretic on hold Monitor for any new episodes  -Paroxysmal atrial fibrillation Continue metoprolol and  amiodarone Anticoagulation on hold secondary to subarachnoid bleed  All the records are reviewed and case discussed with Care Management/Social Worker. Management plans discussed with the patient, family and they are in agreement.  CODE STATUS: DNR  DVT Prophylaxis: SCDs  TOTAL TIME TAKING CARE OF THIS PATIENT: 36 minutes.   POSSIBLE D/C IN 1 DAYS, DEPENDING ON CLINICAL CONDITION.  Saundra Shelling M.D on 09/30/2019 at 10:15 AM  Between 7am to 6pm - Pager - [phone]  After 6pm go to [email or web address] - password [email or web address] [facility]  Office  [phone]  CC: Primary care physician; Rusty Aus, MD  Note: This dictation was prepared with Dragon dictation along with smaller phrase technology. Any transcriptional errors that result from this process are unintentional.

## 2019-10-01 LAB — POTASSIUM: Potassium: 3.3 mmol/L — ABNORMAL LOW (ref 3.5–5.1)

## 2019-10-01 LAB — GLUCOSE, CAPILLARY: Glucose-Capillary: 186 mg/dL — ABNORMAL HIGH (ref 70–99)

## 2019-10-01 MED ORDER — POTASSIUM CHLORIDE CRYS ER 20 MEQ PO TBCR
40.0000 meq | EXTENDED_RELEASE_TABLET | ORAL | Status: DC
  Administered 2019-10-01: 40 meq via ORAL
  Filled 2019-10-01: qty 2

## 2019-10-01 MED ORDER — POTASSIUM CHLORIDE CRYS ER 10 MEQ PO TBCR: 10.0000 meq | EXTENDED_RELEASE_TABLET | Freq: Two times a day (BID) | ORAL | Status: DC

## 2019-10-01 NOTE — Consult Note (Signed)
PHARMACY CONSULT NOTE  Pharmacy Consult for Electrolyte Monitoring and Replacement   Recent Labs: Potassium (mmol/L)  Date Value  10/01/2019 3.3 (L)   Magnesium (mg/dL)  Date Value  09/29/2019 2.4   Calcium (mg/dL)  Date Value  09/30/2019 8.6 (L)   Albumin (g/dL)  Date Value  09/29/2019 4.6   Sodium (mmol/L)  Date Value  09/30/2019 139    Assessment: 85 year old female patient with history of atrial fibrillation on Eliquis, type 2 diabetes mellitus, hypertension, hyperlipidemia, right MCA aneurysm currently hypokalemic.  Yesterday the patient received the following: 20 mEq IV KCl and 40 mEq oral KCl  Goal of Therapy:  Given cardiac history the following goals are warranted: Potassium 4.0 mmol/L Magnesium 2.0 Mg/dL  Plan:   Patient still slightly hypokalemic this am  Replace with oral KCl 40 mEq BID x 2  F/U potassium and magnesium level in am  Dallie Piles ,PharmD Clinical Pharmacist 10/01/2019 7:40 AM

## 2019-10-01 NOTE — Progress Notes (Signed)
Alison Richardson  A and O x 4 VSS. Pt tolerating diet well. No complaints of pain or nausea. IV removed intact, prescriptions given. Pt voices understanding of discharge instructions with no further questions. Pt discharged via wheelchair with axillary.   Allergies as of 10/01/2019   No Known Allergies     Medication List    STOP taking these medications   apixaban 5 MG Tabs tablet Commonly known as: ELIQUIS   meloxicam 15 MG tablet Commonly known as: MOBIC     TAKE these medications   acetaminophen 650 MG CR tablet Commonly known as: TYLENOL Take 650 mg by mouth every 8 (eight) hours as needed for pain.   amiodarone 200 MG tablet Commonly known as: PACERONE Take 200 mg by mouth daily.   amLODipine 5 MG tablet Commonly known as: NORVASC Take 5 mg by mouth daily.   atorvastatin 10 MG tablet Commonly known as: LIPITOR Take 10 mg by mouth daily.   donepezil 10 MG tablet Commonly known as: ARICEPT Take 10 mg by mouth at bedtime.   furosemide 20 MG tablet Commonly known as: LASIX Take 20 mg by mouth daily.   levothyroxine 50 MCG tablet Commonly known as: SYNTHROID Take 50 mcg by mouth daily.   metoprolol succinate 25 MG 24 hr tablet Commonly known as: TOPROL-XL Take 12.5 mg by mouth 2 (two) times daily.   omeprazole 20 MG capsule Commonly known as: PRILOSEC Take 20 mg by mouth daily.   oxybutynin 5 MG tablet Commonly known as: DITROPAN Take 5 mg by mouth daily.   potassium chloride 10 MEQ tablet Commonly known as: KLOR-CON Take 10 mEq by mouth 2 (two) times daily.   rivastigmine 4.6 mg/24hr Commonly known as: EXELON Place 4.6 mg onto the skin daily.   venlafaxine XR 75 MG 24 hr capsule Commonly known as: EFFEXOR-XR Take 75 mg by mouth daily with breakfast.       Vitals:   10/01/19 0457 10/01/19 0810  BP: 115/63 119/76  Pulse: 68 65  Resp: 18   Temp: 98.3 F (36.8 C)   SpO2: 97%     Alison Richardson

## 2019-10-01 NOTE — Discharge Summary (Signed)
[facility] [place] NAME: Alison Richardson    MR#:  [id number]  DATE OF BIRTH:  05-Apr-1933  DATE OF ADMISSION:  09/29/2019 ADMITTING PHYSICIAN: Hillary Bow, MD  DATE OF DISCHARGE: 10/01/2019  PRIMARY CARE PHYSICIAN: Rusty Aus, MD   ADMISSION DIAGNOSIS:  Syncope and collapse [R55] SAH (subarachnoid hemorrhage) (HCC) [I60.9] Injury of head, initial encounter [S09.90XA]  DISCHARGE DIAGNOSIS:  Active Problems:   Syncope   Syncope and collapse Subarachnoid bleed Acute hypokalemia  SECONDARY DIAGNOSIS:   Past Medical History:  Diagnosis Date  . Arrhythmia   . Atrial flutter ([place]) 01/2018   new onset   . Basal cell carcinoma of back   . Basal cell carcinoma of lip   . Cerebral aneurysm    followed by [facility]  . DDD (degenerative disc disease), lumbar    superior plate depression, L3 08/18/2014  . Diabetes mellitus type II, controlled ([place])   . Diverticulosis   . Dysthymia    depression  . GERD (gastroesophageal reflux disease)   . History of meniscal tear   . Hyperlipidemia   . Hypertension   . Mild pulmonary hypertension ([place])   . Overactive bladder   . Seasonal allergic rhinitis      ADMITTING HISTORY Alison Richardson  is a 85 y.o. female with a known history of atrial fibrillation on Eliquis, diabetes mellitus, hypertension, hyperlipidemia, right MCA aneurysm presents to the emergency room from her independent living facility after patient got up from bed took a few steps and passed out landing on her left side.  Patient had significant neck and head pain.  Was brought to the emergency room.  Here she had a left frontal subarachnoid hemorrhage which was minute.  CT scan was repeated in 6 hours and was stable.  Neurosurgery Dr. Lacinda Axon has seen the patient in the emergency room and advised no further work-up but stop Eliquis for 7 days.  At this point patient is being admitted for further work-up and monitoring of her syncopal  episode.She had no chest pain or shortness of breath.  She did feel dizzy and lightheaded prior to passing out.  Due to lower extremity swelling she was started on Lasix 3 days back and her swelling has resolved at this point.  No other change in medications.  She does have significant bruising around her left eye but no change in vision.    HOSPITAL COURSE:  Patient was admitted to medical floor.  She was worked up with 2 CT scan of the pelvis follow-up on the subarachnoid hemorrhage.  Neurosurgery evaluated the patient.  Subarachnoid bleed was stable.  Patient has right MCA aneurysm on the contralateral side which does not seem to be involved.  Advised Eliquis to be stopped for 7 days.  Patient received physical therapy who recommended home health services.  Potassium was aggressively replaced intravenously and orally.  She was also worked up with CT cervical spine, CT maxillofacial area, chest x-ray and hand x-ray.  CT maxillofacial area shows left periorbital contusion but no fracture.  CT cervical spine showed no fracture.  CT head showed MCA aneurysm which is old and a small left frontal meningioma.  Patient follows up at [facility] for for aneurysm.  CONSULTS OBTAINED:  Neurosurgery  DRUG ALLERGIES:  No Known Allergies  DISCHARGE MEDICATIONS:   Allergies as of 10/01/2019   No Known Allergies     Medication List    STOP taking these medications   apixaban  5 MG Tabs tablet Commonly known as: ELIQUIS   meloxicam 15 MG tablet Commonly known as: MOBIC     TAKE these medications   acetaminophen 650 MG CR tablet Commonly known as: TYLENOL Take 650 mg by mouth every 8 (eight) hours as needed for pain.   amiodarone 200 MG tablet Commonly known as: PACERONE Take 200 mg by mouth daily.   amLODipine 5 MG tablet Commonly known as: NORVASC Take 5 mg by mouth daily.   atorvastatin 10 MG tablet Commonly known as: LIPITOR Take 10 mg by mouth daily.   donepezil 10 MG  tablet Commonly known as: ARICEPT Take 10 mg by mouth at bedtime.   furosemide 20 MG tablet Commonly known as: LASIX Take 20 mg by mouth daily.   levothyroxine 50 MCG tablet Commonly known as: SYNTHROID Take 50 mcg by mouth daily.   metoprolol succinate 25 MG 24 hr tablet Commonly known as: TOPROL-XL Take 12.5 mg by mouth 2 (two) times daily.   omeprazole 20 MG capsule Commonly known as: PRILOSEC Take 20 mg by mouth daily.   oxybutynin 5 MG tablet Commonly known as: DITROPAN Take 5 mg by mouth daily.   potassium chloride 10 MEQ tablet Commonly known as: KLOR-CON Take 10 mEq by mouth 2 (two) times daily.   rivastigmine 4.6 mg/24hr Commonly known as: EXELON Place 4.6 mg onto the skin daily.   venlafaxine XR 75 MG 24 hr capsule Commonly known as: EFFEXOR-XR Take 75 mg by mouth daily with breakfast.       Today  Patient seen today Tolerating diet well No headache Or blurry vision Ecchymosis around the orbit improved VITAL SIGNS:  Blood pressure 119/76, pulse 65, temperature 98.3 F (36.8 C), temperature source Oral, resp. rate 18, height 5\' 6"  (1.676 m), weight 75.9 kg, SpO2 97 %.  I/O:    Intake/Output Summary (Last 24 hours) at 10/01/2019 1027 Last data filed at 10/01/2019 1018 Gross per 24 hour  Intake 238 ml  Output 400 ml  Net -162 ml    PHYSICAL EXAMINATION:  Physical Exam  GENERAL:  85 y.o.-year-old patient lying in the bed with no acute distress.  LUNGS: Normal breath sounds bilaterally, no wheezing, rales,rhonchi or crepitation. No use of accessory muscles of respiration.  CARDIOVASCULAR: S1, S2 normal. No murmurs, rubs, or gallops.  ABDOMEN: Soft, non-tender, non-distended. Bowel sounds present. No organomegaly or mass.  NEUROLOGIC: Moves all 4 extremities. PSYCHIATRIC: The patient is alert and oriented x 3.  SKIN: No obvious rash, lesion, or ulcer.   DATA REVIEW:   CBC Recent Labs  Lab 09/30/19 0431  WBC 11.0*  HGB 13.2  HCT 37.6   PLT 215    Chemistries  Recent Labs  Lab 09/29/19 0400 09/29/19 0754 09/30/19 0431  10/01/19 0424  NA 136  --  139  --   --   K 3.2*  --  2.6*   < > 3.3*  CL 94*  --  102  --   --   CO2 25  --  27  --   --   GLUCOSE 264*  --  207*  --   --   BUN 19  --  11  --   --   CREATININE 0.84  --  0.58  --   --   CALCIUM 9.7  --  8.6*  --   --   MG  --  2.4  --   --   --   AST 51*  --   --   --   --  ALT 50*  --   --   --   --   ALKPHOS 117  --   --   --   --   BILITOT 0.8  --   --   --   --    < > = values in this interval not displayed.    Cardiac Enzymes No results for input(s): TROPONINI in the last 168 hours.  Microbiology Results  Results for orders placed or performed during the hospital encounter of 09/29/19  SARS CORONAVIRUS 2 (TAT 6-24 HRS) Nasopharyngeal Nasopharyngeal Swab     Status: None   Collection Time: 09/29/19  4:00 AM   Specimen: Nasopharyngeal Swab  Result Value Ref Range Status   SARS Coronavirus 2 NEGATIVE NEGATIVE Final    Comment: (NOTE) SARS-CoV-2 target nucleic acids are NOT DETECTED. The SARS-CoV-2 RNA is generally detectable in upper and lower respiratory specimens during the acute phase of infection. Negative results do not preclude SARS-CoV-2 infection, do not rule out co-infections with other pathogens, and should not be used as the sole basis for treatment or other patient management decisions. Negative results must be combined with clinical observations, patient history, and epidemiological information. The expected result is Negative. Fact Sheet for Patients: SugarRoll.be Fact Sheet for Healthcare Providers: https://www.woods-mathews.com/ This test is not yet approved or cleared by the [place] FDA and  has been authorized for detection and/or diagnosis of SARS-CoV-2 by FDA under an Emergency Use Authorization (EUA). This EUA will remain  in effect (meaning this test can be used) for the  duration of the COVID-19 declaration under Section 56 4(b)(1) of the Act, 21 U.S.C. section 360bbb-3(b)(1), unless the authorization is terminated or revoked sooner. Performed at [facility], [place] [address], [place], [place] [address]     RADIOLOGY:  Dg Hip Unilat With Pelvis 2-3 Views Left  Result Date: 09/29/2019 CLINICAL DATA:  Status post fall EXAM: DG HIP (WITH OR WITHOUT PELVIS) 2-3V LEFT COMPARISON:  None. FINDINGS: Generalized osteopenia. No fracture or dislocation. No aggressive osseous lesion. IMPRESSION: No acute osseous injury of the left hip. Given the patient's age and osteopenia, if there is persistent clinical concern for an occult hip fracture, a MRI of the hip is recommended for increased sensitivity. Electronically Signed   By: Kathreen Devoid   On: 09/29/2019 16:46    Follow up with PCP in 1 week.  Management plans discussed with the patient, family and they are in agreement.  CODE STATUS: DNR    Code Status Orders  (From admission, onward)         Start     Ordered   09/29/19 1156  Do not attempt resuscitation (DNR)  Continuous    Question Answer Comment  In the event of cardiac or respiratory ARREST Do not call a "code blue"   In the event of cardiac or respiratory ARREST Do not perform Intubation, CPR, defibrillation or ACLS   In the event of cardiac or respiratory ARREST Use medication by any route, position, wound care, and other measures to relive pain and suffering. May use oxygen, suction and manual treatment of airway obstruction as needed for comfort.      09/29/19 1157        Code Status History    This patient has a current code status but no historical code status.   Advance Care Planning Activity    Advance Directive Documentation     Most Recent Value  Type of Advance Directive  Healthcare  Power of Attorney  Pre-existing out of facility DNR order (yellow form or pink MOST form)  -  "MOST" Form in Place?  -      TOTAL TIME  TAKING CARE OF THIS PATIENT ON DAY OF DISCHARGE: more than 34 minutes.   Saundra Shelling M.D on 10/01/2019 at 10:27 AM  Between 7am to 6pm - Pager - [phone]  After 6pm go to [email or web address] - password [email or web address] [facility]  Office  [phone]  CC: Primary care physician; Rusty Aus, MD  Note: This dictation was prepared with Dragon dictation along with smaller phrase technology. Any transcriptional errors that result from this process are unintentional.

## 2019-10-01 NOTE — TOC Transition Note (Signed)
Transition of Care [facility]) - CM/SW Discharge Note   Patient Details  Name: Alison Richardson MRN: [id number] Date of Birth: 11/23/1933  [facility] CM/SW Contact:  Beverly Sessions, RN Phone Number: 10/01/2019, 4:49 PM   Clinical Narrative:     Patient admitted from home with syncope Patient lives at home alone. Lives at [facility] independent   PCP [facility].  Denies issues obtaining medications  PT has evaluated patient and recommends home health PT.  Patient agreeable to services.  MD has ordered home health RN and PT.  Patient request for [facility] to arrange the services through their facility.  Orders were faxed to [facility] [place] to transport at discharge  Final next level of care: [place] Barriers to Discharge: No Barriers Identified   Patient Goals and CMS Choice        Discharge Placement                       Discharge Plan and Services                          HH Arranged: RN, PT          Social Determinants of Health (SDOH) Interventions     Readmission Risk Interventions No flowsheet data found.

## 2019-12-15 ENCOUNTER — Other Ambulatory Visit: Payer: Self-pay | Admitting: Student

## 2019-12-15 DIAGNOSIS — R110 Nausea: Secondary | ICD-10-CM

## 2019-12-15 DIAGNOSIS — R5383 Other fatigue: Secondary | ICD-10-CM

## 2019-12-15 DIAGNOSIS — R635 Abnormal weight gain: Secondary | ICD-10-CM

## 2019-12-15 DIAGNOSIS — R1084 Generalized abdominal pain: Secondary | ICD-10-CM

## 2019-12-30 ENCOUNTER — Ambulatory Visit: Admission: RE | Admit: 2019-12-30 | Payer: Medicare PPO | Source: Ambulatory Visit

## 2020-01-01 ENCOUNTER — Other Ambulatory Visit: Payer: Self-pay

## 2020-01-01 ENCOUNTER — Ambulatory Visit
Admission: RE | Admit: 2020-01-01 | Discharge: 2020-01-01 | Disposition: A | Discharge status: Home / Self Care | Payer: Medicare PPO | Source: Ambulatory Visit | Attending: Student | Admitting: Student

## 2020-01-01 DIAGNOSIS — R635 Abnormal weight gain: Secondary | ICD-10-CM | POA: Diagnosis present

## 2020-01-01 DIAGNOSIS — R5383 Other fatigue: Secondary | ICD-10-CM | POA: Diagnosis present

## 2020-01-01 DIAGNOSIS — R1084 Generalized abdominal pain: Secondary | ICD-10-CM

## 2020-01-01 DIAGNOSIS — R110 Nausea: Secondary | ICD-10-CM | POA: Diagnosis present

## 2020-01-01 MED ORDER — IOHEXOL 300 MG/ML  SOLN
100.0000 mL | Freq: Once | INTRAMUSCULAR | Status: AC | PRN
  Administered 2020-01-01: 10:00:00 100 mL via INTRAVENOUS

## 2020-05-09 ENCOUNTER — Ambulatory Visit
Admission: EM | Admit: 2020-05-09 | Discharge: 2020-05-09 | Disposition: A | Discharge status: Home / Self Care | Payer: Medicare PPO

## 2020-05-09 ENCOUNTER — Encounter: Payer: Self-pay | Admitting: Emergency Medicine

## 2020-05-09 ENCOUNTER — Other Ambulatory Visit: Payer: Self-pay

## 2020-05-09 ENCOUNTER — Observation Stay
Admission: EM | Admit: 2020-05-09 | Discharge: 2020-05-10 | Disposition: A | Discharge status: Home / Self Care | Payer: Medicare PPO | Attending: Family Medicine | Admitting: Family Medicine

## 2020-05-09 ENCOUNTER — Emergency Department: Payer: Medicare PPO

## 2020-05-09 DIAGNOSIS — I480 Paroxysmal atrial fibrillation: Secondary | ICD-10-CM | POA: Diagnosis not present

## 2020-05-09 DIAGNOSIS — E1169 Type 2 diabetes mellitus with other specified complication: Secondary | ICD-10-CM | POA: Diagnosis not present

## 2020-05-09 DIAGNOSIS — R0609 Other forms of dyspnea: Secondary | ICD-10-CM | POA: Diagnosis not present

## 2020-05-09 DIAGNOSIS — N3281 Overactive bladder: Secondary | ICD-10-CM | POA: Insufficient documentation

## 2020-05-09 DIAGNOSIS — Z66 Do not resuscitate: Secondary | ICD-10-CM | POA: Diagnosis not present

## 2020-05-09 DIAGNOSIS — F329 Major depressive disorder, single episode, unspecified: Secondary | ICD-10-CM | POA: Diagnosis not present

## 2020-05-09 DIAGNOSIS — E039 Hypothyroidism, unspecified: Secondary | ICD-10-CM | POA: Diagnosis not present

## 2020-05-09 DIAGNOSIS — E1142 Type 2 diabetes mellitus with diabetic polyneuropathy: Secondary | ICD-10-CM | POA: Diagnosis not present

## 2020-05-09 DIAGNOSIS — Z7984 Long term (current) use of oral hypoglycemic drugs: Secondary | ICD-10-CM | POA: Diagnosis not present

## 2020-05-09 DIAGNOSIS — Z791 Long term (current) use of non-steroidal anti-inflammatories (NSAID): Secondary | ICD-10-CM | POA: Diagnosis not present

## 2020-05-09 DIAGNOSIS — Z96652 Presence of left artificial knee joint: Secondary | ICD-10-CM | POA: Insufficient documentation

## 2020-05-09 DIAGNOSIS — Z79890 Hormone replacement therapy (postmenopausal): Secondary | ICD-10-CM | POA: Insufficient documentation

## 2020-05-09 DIAGNOSIS — K219 Gastro-esophageal reflux disease without esophagitis: Secondary | ICD-10-CM | POA: Diagnosis not present

## 2020-05-09 DIAGNOSIS — I4891 Unspecified atrial fibrillation: Secondary | ICD-10-CM

## 2020-05-09 DIAGNOSIS — R002 Palpitations: Secondary | ICD-10-CM

## 2020-05-09 DIAGNOSIS — R0600 Dyspnea, unspecified: Secondary | ICD-10-CM | POA: Diagnosis not present

## 2020-05-09 DIAGNOSIS — Z7982 Long term (current) use of aspirin: Secondary | ICD-10-CM | POA: Diagnosis not present

## 2020-05-09 DIAGNOSIS — R531 Weakness: Secondary | ICD-10-CM | POA: Insufficient documentation

## 2020-05-09 DIAGNOSIS — G4700 Insomnia, unspecified: Secondary | ICD-10-CM | POA: Insufficient documentation

## 2020-05-09 DIAGNOSIS — I272 Pulmonary hypertension, unspecified: Secondary | ICD-10-CM | POA: Diagnosis not present

## 2020-05-09 DIAGNOSIS — R0602 Shortness of breath: Secondary | ICD-10-CM

## 2020-05-09 DIAGNOSIS — Z79899 Other long term (current) drug therapy: Secondary | ICD-10-CM | POA: Diagnosis not present

## 2020-05-09 DIAGNOSIS — M1711 Unilateral primary osteoarthritis, right knee: Secondary | ICD-10-CM | POA: Diagnosis not present

## 2020-05-09 DIAGNOSIS — I4892 Unspecified atrial flutter: Secondary | ICD-10-CM | POA: Insufficient documentation

## 2020-05-09 DIAGNOSIS — Z8249 Family history of ischemic heart disease and other diseases of the circulatory system: Secondary | ICD-10-CM | POA: Insufficient documentation

## 2020-05-09 DIAGNOSIS — Z7901 Long term (current) use of anticoagulants: Secondary | ICD-10-CM | POA: Diagnosis not present

## 2020-05-09 DIAGNOSIS — I10 Essential (primary) hypertension: Secondary | ICD-10-CM | POA: Diagnosis not present

## 2020-05-09 DIAGNOSIS — E1140 Type 2 diabetes mellitus with diabetic neuropathy, unspecified: Secondary | ICD-10-CM | POA: Diagnosis present

## 2020-05-09 DIAGNOSIS — Z20822 Contact with and (suspected) exposure to covid-19: Secondary | ICD-10-CM | POA: Diagnosis not present

## 2020-05-09 DIAGNOSIS — E782 Mixed hyperlipidemia: Secondary | ICD-10-CM | POA: Diagnosis not present

## 2020-05-09 DIAGNOSIS — E1159 Type 2 diabetes mellitus with other circulatory complications: Secondary | ICD-10-CM | POA: Diagnosis present

## 2020-05-09 DIAGNOSIS — E119 Type 2 diabetes mellitus without complications: Secondary | ICD-10-CM

## 2020-05-09 LAB — URINALYSIS, COMPLETE (UACMP) WITH MICROSCOPIC
Bilirubin Urine: NEGATIVE
Glucose, UA: NEGATIVE mg/dL
Ketones, ur: NEGATIVE mg/dL
Nitrite: NEGATIVE
Protein, ur: NEGATIVE mg/dL
Specific Gravity, Urine: 1.018 (ref 1.005–1.030)
pH: 5 (ref 5.0–8.0)

## 2020-05-09 LAB — CBC
HCT: 43.4 % (ref 36.0–46.0)
Hemoglobin: 15.1 g/dL — ABNORMAL HIGH (ref 12.0–15.0)
MCH: 29.5 pg (ref 26.0–34.0)
MCHC: 34.8 g/dL (ref 30.0–36.0)
MCV: 84.9 fL (ref 80.0–100.0)
Platelets: 323 10*3/uL (ref 150–400)
RBC: 5.11 MIL/uL (ref 3.87–5.11)
RDW: 14.1 % (ref 11.5–15.5)
WBC: 11.8 10*3/uL — ABNORMAL HIGH (ref 4.0–10.5)
nRBC: 0 % (ref 0.0–0.2)

## 2020-05-09 LAB — GLUCOSE, CAPILLARY
Glucose-Capillary: 178 mg/dL — ABNORMAL HIGH (ref 70–99)
Glucose-Capillary: 114 mg/dL — ABNORMAL HIGH (ref 70–99)

## 2020-05-09 LAB — TROPONIN I (HIGH SENSITIVITY)
Troponin I (High Sensitivity): 13 ng/L (ref <18)
Troponin I (High Sensitivity): 13 ng/L (ref <18)

## 2020-05-09 LAB — BASIC METABOLIC PANEL
Anion gap: 9 (ref 5–15)
BUN: 19 mg/dL (ref 8–23)
CO2: 23 mmol/L (ref 22–32)
Calcium: 9.1 mg/dL (ref 8.9–10.3)
Chloride: 108 mmol/L (ref 98–111)
Creatinine, Ser: 0.74 mg/dL (ref 0.44–1.00)
GFR calc Af Amer: >60 mL/min (ref >60)
GFR calc non Af Amer: >60 mL/min (ref >60)
Glucose, Bld: 84 mg/dL (ref 70–99)
Potassium: 3.7 mmol/L (ref 3.5–5.1)
Sodium: 140 mmol/L (ref 135–145)

## 2020-05-09 LAB — SARS CORONAVIRUS 2 BY RT PCR (HOSPITAL ORDER, PERFORMED IN ~~LOC~~ HOSPITAL LAB): SARS Coronavirus 2: NEGATIVE

## 2020-05-09 MED ORDER — ASPIRIN 81 MG PO CHEW
324.0000 mg | CHEWABLE_TABLET | Freq: Once | ORAL | Status: AC
  Administered 2020-05-09: 324 mg via ORAL
  Filled 2020-05-09: qty 4

## 2020-05-09 MED ORDER — INSULIN ASPART 100 UNIT/ML ~~LOC~~ SOLN: 0.0000 [IU] | Freq: Three times a day (TID) | SUBCUTANEOUS | Status: DC

## 2020-05-09 MED ORDER — DONEPEZIL HCL 5 MG PO TABS
10.0000 mg | ORAL_TABLET | Freq: Every day | ORAL | Status: DC
  Administered 2020-05-09: 10 mg via ORAL
  Filled 2020-05-09: qty 2

## 2020-05-09 MED ORDER — POTASSIUM CHLORIDE CRYS ER 10 MEQ PO TBCR
10.0000 meq | EXTENDED_RELEASE_TABLET | Freq: Two times a day (BID) | ORAL | Status: DC
  Administered 2020-05-09 – 2020-05-10 (×2): 10 meq via ORAL
  Filled 2020-05-09 (×3): qty 1

## 2020-05-09 MED ORDER — FUROSEMIDE 20 MG PO TABS
20.0000 mg | ORAL_TABLET | Freq: Every day | ORAL | Status: DC
  Administered 2020-05-09: 20 mg via ORAL
  Filled 2020-05-09 (×2): qty 1

## 2020-05-09 MED ORDER — RIVASTIGMINE 4.6 MG/24HR TD PT24
4.6000 mg | MEDICATED_PATCH | Freq: Every day | TRANSDERMAL | Status: DC
  Administered 2020-05-09: 4.6 mg via TRANSDERMAL
  Filled 2020-05-09: qty 1

## 2020-05-09 MED ORDER — ATORVASTATIN CALCIUM 20 MG PO TABS
10.0000 mg | ORAL_TABLET | Freq: Every day | ORAL | Status: DC
  Administered 2020-05-09 – 2020-05-10 (×2): 10 mg via ORAL
  Filled 2020-05-09 (×2): qty 1

## 2020-05-09 MED ORDER — AMLODIPINE BESYLATE 5 MG PO TABS
5.0000 mg | ORAL_TABLET | Freq: Every day | ORAL | Status: DC
  Administered 2020-05-10 (×2): 5 mg via ORAL
  Filled 2020-05-09 (×2): qty 1

## 2020-05-09 MED ORDER — AMIODARONE HCL 200 MG PO TABS
200.0000 mg | ORAL_TABLET | Freq: Every day | ORAL | Status: DC
  Administered 2020-05-09 – 2020-05-10 (×2): 200 mg via ORAL
  Filled 2020-05-09 (×3): qty 1

## 2020-05-09 MED ORDER — APIXABAN 5 MG PO TABS
5.0000 mg | ORAL_TABLET | Freq: Two times a day (BID) | ORAL | Status: DC
  Administered 2020-05-09 – 2020-05-10 (×2): 5 mg via ORAL
  Filled 2020-05-09 (×2): qty 1

## 2020-05-09 MED ORDER — ONDANSETRON HCL 4 MG/2ML IJ SOLN: 4.0000 mg | Freq: Four times a day (QID) | INTRAMUSCULAR | Status: DC | PRN

## 2020-05-09 MED ORDER — ASPIRIN EC 81 MG PO TBEC
81.0000 mg | DELAYED_RELEASE_TABLET | Freq: Every day | ORAL | Status: DC
  Administered 2020-05-10: 81 mg via ORAL
  Filled 2020-05-09: qty 1

## 2020-05-09 MED ORDER — ACETAMINOPHEN 325 MG PO TABS: 650.0000 mg | ORAL_TABLET | ORAL | Status: DC | PRN

## 2020-05-09 MED ORDER — VENLAFAXINE HCL ER 75 MG PO CP24
75.0000 mg | ORAL_CAPSULE | Freq: Every day | ORAL | Status: DC
  Administered 2020-05-09: 75 mg via ORAL
  Filled 2020-05-09 (×2): qty 1

## 2020-05-09 MED ORDER — METOPROLOL SUCCINATE ER 25 MG PO TB24
12.5000 mg | ORAL_TABLET | Freq: Two times a day (BID) | ORAL | Status: DC
  Administered 2020-05-10: 12.5 mg via ORAL
  Filled 2020-05-09 (×2): qty 0.5
  Filled 2020-05-09: qty 1

## 2020-05-09 NOTE — ED Provider Notes (Signed)
[facility] Emergency Department Provider Note  ____________________________________________  Time seen: Approximately 4:55 PM  I have reviewed the triage vital signs and the nursing notes.   HISTORY  Chief Complaint Weakness    HPI Alison Richardson is a 86 y.o. female with a history of atrial fibrillation, diabetes, GERD, hypertension who comes the ED complaining of generalized weakness, tightness in the neck, and shortness of breath that started yesterday at about 6:00 PM while walking in her home.  She sat down and rested and the symptoms went away.  Since then she has noticed that whenever she gets up and walks she gets short of breath and neck tightness which is bilateral.  Nonradiating.  Denies chest pain.  No palpitations or syncope.  Denies cough fever chills.  She has had both Covid vaccines      Past Medical History:  Diagnosis Date  . Arrhythmia   . Atrial flutter ([place]) 01/2018   new onset   . Basal cell carcinoma of back   . Basal cell carcinoma of lip   . Cerebral aneurysm    followed by [facility]  . DDD (degenerative disc disease), lumbar    superior plate depression, L3 08/18/2014  . Diabetes mellitus type II, controlled ([place])   . Diverticulosis   . Dysthymia    depression  . GERD (gastroesophageal reflux disease)   . History of meniscal tear   . Hyperlipidemia   . Hypertension   . Mild pulmonary hypertension ([place])   . Overactive bladder   . Seasonal allergic rhinitis      Patient Active Problem List   Diagnosis Date Noted  . DOE (dyspnea on exertion) 05/09/2020  . Syncope and collapse 09/30/2019  . Syncope 09/29/2019  . Diabetes mellitus type 2 in nonobese ([place]) 01/21/2019  . Dyslipidemia 01/21/2019  . PSVT (paroxysmal supraventricular tachycardia) ([place]) 01/21/2019  . Tenosynovitis of foot 01/21/2019  . PAD (peripheral artery disease) ([place]) 01/06/2019  . Preop testing 01/06/2019  . DM type 2 with diabetic mixed hyperlipidemia  ([place]) 09/08/2018  . Peripheral neuropathy, idiopathic 09/08/2018  . Anxiety disorder 06/19/2018  . Depression, major, single episode, mild ([place]) 06/19/2018  . Paroxysmal atrial fibrillation ([place]) 06/19/2018  . Atrial flutter ([place]) 06/19/2018  . Pure hypercholesterolemia 06/19/2018  . Vitamin B12 deficiency 06/19/2018  . Insomnia 06/19/2018  . Protein-calorie malnutrition ([place]) 06/19/2018  . Type 2 diabetes mellitus with diabetic neuropathy ([place]) 06/19/2018  . Knee pain 06/19/2018  . Cognitive impairment, mild, so stated 06/19/2018  . Osteoarthritis of right knee 06/19/2018  . Benign essential hypertension 06/19/2018  . Hypothyroidism 06/19/2018  . Overactive bladder 06/19/2018  . Generalized muscle weakness 06/19/2018  . Difficulty in walking, not elsewhere classified 06/19/2018  . Major depression in remission ([place]) 06/18/2018  . Primary osteoarthritis involving multiple joints 06/18/2018  . Mixed hyperlipidemia 11/01/2015  . Cerebral aneurysm 04/29/2013     Past Surgical History:  Procedure Laterality Date  . APPENDECTOMY  1946  . BASAL CELL CARCINOMA EXCISION     2006 and 2009 removed from back and lip  . CARDIOVASCULAR STRESS TEST  2015   nuclear cardiac stress test negative for ischemia - Dr. Satira Sark  . CATARACT EXTRACTION, BILATERAL  2006  . COLONOSCOPY  2014  . [place]  . INJECTION KNEE Left 05/2015  . REPLACEMENT TOTAL KNEE Left 09/06/2016   Dr. Barnet Pall  . TONSILLECTOMY AND ADENOIDECTOMY  1953  . TOTAL ABDOMINAL HYSTERECTOMY  1986   [facility]  Prior to Admission medications   Medication Sig Start Date End Date Taking? Authorizing Provider  acetaminophen (TYLENOL) 650 MG CR tablet Take 650 mg by mouth every 8 (eight) hours as needed for pain.    [provider]  amiodarone (PACERONE) 200 MG tablet Take 200 mg by mouth daily.  01/02/19   [provider]  amLODipine (NORVASC) 5 MG tablet Take 5 mg by mouth daily. 09/21/19    [provider]  atorvastatin (LIPITOR) 10 MG tablet Take 10 mg by mouth daily.    [provider]  celecoxib (CELEBREX) 200 MG capsule Take 200 mg by mouth daily. 04/27/20   [provider]  donepezil (ARICEPT) 10 MG tablet Take 10 mg by mouth at bedtime.    [provider]  ELIQUIS 5 MG TABS tablet SMARTSIG:1 Tablet(s) By Mouth Every 12 Hours 05/06/20   [provider]  furosemide (LASIX) 20 MG tablet Take 20 mg by mouth daily. 09/23/19   [provider]  gabapentin (NEURONTIN) 100 MG capsule SMARTSIG:1 Capsule(s) By Mouth Every Evening 05/06/20   [provider]  hydrochlorothiazide (HYDRODIURIL) 12.5 MG tablet Take 12.5 mg by mouth daily.    [provider]  levothyroxine (SYNTHROID) 50 MCG tablet Take 50 mcg by mouth daily. 09/21/19   [provider]  levothyroxine (SYNTHROID) 88 MCG tablet Take 88 mcg by mouth daily before breakfast.    [provider]  metFORMIN (GLUCOPHAGE) 500 MG tablet Take 250 mg by mouth 2 (two) times daily. 03/18/20   [provider]  metoprolol succinate (TOPROL-XL) 25 MG 24 hr tablet Take 12.5 mg by mouth 2 (two) times daily.    [provider]  omeprazole (PRILOSEC) 20 MG capsule Take 20 mg by mouth daily.    [provider]  oxybutynin (DITROPAN) 5 MG tablet Take 5 mg by mouth daily.     [provider]  potassium chloride (K-DUR) 10 MEQ tablet Take 10 mEq by mouth 2 (two) times daily.     [provider]  rivastigmine (EXELON) 4.6 mg/24hr Place 4.6 mg onto the skin daily.    [provider]  venlafaxine XR (EFFEXOR-XR) 75 MG 24 hr capsule Take 75 mg by mouth daily with breakfast.     [provider]     Allergies Patient has no known allergies.   Family History  Problem Relation Age of Onset  . Hypertension Mother   . CAD Father     Social History Social History   Tobacco Use  . Smoking status: Never  Smoker  . Smokeless tobacco: Never Used  Substance Use Topics  . Alcohol use: Not Currently  . Drug use: Not on file    Review of Systems  Constitutional:   No fever or chills.  ENT:   No sore throat. No rhinorrhea. Cardiovascular:   No chest pain or syncope. Respiratory: Positive shortness of breath as above without cough. Gastrointestinal:   Negative for abdominal pain, vomiting and diarrhea.  Musculoskeletal:   Negative for focal pain or swelling All other systems reviewed and are negative except as documented above in ROS and HPI.  ____________________________________________   PHYSICAL EXAM:  VITAL SIGNS: ED Triage Vitals  Enc Vitals Group     BP 05/09/20 1123 136/77     Pulse Rate 05/09/20 1123 (!) 102     Resp 05/09/20 1123 16     Temp 05/09/20 1123 97.6 F (36.4 C)     Temp Source 05/09/20 1123 Oral  SpO2 05/09/20 1123 95 %     Weight 05/09/20 1125 170 lb (77.1 kg)     Height 05/09/20 1125 5\' 6"  (1.676 m)     Head Circumference --      Peak Flow --      Pain Score 05/09/20 1125 0     Pain Loc --      Pain Edu? --      Excl. in [place]? --     Vital signs reviewed, nursing assessments reviewed.   Constitutional:   Alert and oriented. Non-toxic appearance. Eyes:   Conjunctivae are normal. EOMI. PERRL. ENT      Head:   Normocephalic and atraumatic.      Nose:   Wearing a mask.      Mouth/Throat:   Wearing a mask.      Neck:   No meningismus. Full ROM.  Thyroid nonpalpable Hematological/Lymphatic/Immunilogical:   No cervical lymphadenopathy. Cardiovascular:   RRR. Symmetric bilateral radial and DP pulses.  No murmurs. Cap refill less than 2 seconds. Respiratory:   Normal respiratory effort without tachypnea/retractions. Breath sounds are clear and equal bilaterally. No wheezes/rales/rhonchi. Gastrointestinal:   Soft and nontender. Non distended. There is no CVA tenderness.  No rebound, rigidity, or guarding. Musculoskeletal:   Normal range of motion in all  extremities. No joint effusions.  No lower extremity tenderness.  No edema. Neurologic:   Normal speech and language.  Motor grossly intact. No acute focal neurologic deficits are appreciated.  Skin:    Skin is warm, dry and intact. No rash noted.  No petechiae, purpura, or bullae.  ____________________________________________    LABS (pertinent positives/negatives) (all labs ordered are listed, but only abnormal results are displayed) Labs Reviewed  CBC - Abnormal; Notable for the following components:      Result Value   WBC 11.8 (*)    Hemoglobin 15.1 (*)    All other components within normal limits  URINALYSIS, COMPLETE (UACMP) WITH MICROSCOPIC - Abnormal; Notable for the following components:   Color, Urine YELLOW (*)    APPearance HAZY (*)    Hgb urine dipstick SMALL (*)    Leukocytes,Ua LARGE (*)    Bacteria, UA RARE (*)    All other components within normal limits  SARS CORONAVIRUS 2 BY RT PCR (HOSPITAL ORDER, [place] [facility])  BASIC METABOLIC PANEL  TROPONIN I (HIGH SENSITIVITY)   ____________________________________________   EKG  Interpreted by me Sinus rhythm rate of 98, left axis, first-degree AV block with PR interval 280 ms.  Poor R wave progression.  Normal ST segments and T waves.  ____________________________________________    [id number]  DG Chest Portable 1 View  Result Date: 05/09/2020 CLINICAL DATA:  Shortness of breath with exertion EXAM: PORTABLE CHEST 1 VIEW COMPARISON:  09/29/2019 FINDINGS: Heart and mediastinal contours are within normal limits. No focal opacities or effusions. No acute bony abnormality. IMPRESSION: No active disease. Electronically Signed   By: Rolm Baptise M.D.   On: 05/09/2020 17:14    ____________________________________________   PROCEDURES Procedures  ____________________________________________  DIFFERENTIAL DIAGNOSIS   Pneumonia, pleural effusion, pulmonary edema, non-STEMI,  deconditioning, dehydration, electrolyte abnormality  CLINICAL IMPRESSION / ASSESSMENT AND PLAN / ED COURSE  Medications ordered in the ED: Medications - No data to display  Pertinent labs & imaging results that were available during my care of the patient were reviewed by me and considered in my medical decision making (see chart for details).  Alison Richardson was evaluated in Emergency  Department on 05/09/2020 for the symptoms described in the history of present illness. She was evaluated in the context of the global COVID-19 pandemic, which necessitated consideration that the patient might be at risk for infection with the SARS-CoV-2 virus that causes COVID-19. Institutional protocols and algorithms that pertain to the evaluation of patients at risk for COVID-19 are in a state of rapid change based on information released by regulatory bodies including the CDC and federal and state organizations. These policies and algorithms were followed during the patient's care in the ED.   Patient presents with exertional symptoms since yesterday.  With age, comorbidities, significant cardiac disease, will check troponin and chest x-ray, plan to hospitalize for further cardiac evaluation.  She sees Dr. Ubaldo Glassing from cardiology normally.  Since she is already on Eliquis and been compliant with her medication, I will defer on giving aspirin at this time due to heightened bleeding risk.      ____________________________________________   FINAL CLINICAL IMPRESSION(S) / ED DIAGNOSES    Final diagnoses:  DOE (dyspnea on exertion)  Generalized weakness  Atrial fibrillation, unspecified type ([place])  Type 2 diabetes mellitus without complication, without long-term current use of insulin [name])     ED Discharge Orders    None      Portions of this note were generated with dragon dictation software. Dictation errors may occur despite best attempts at proofreading.   Carrie Mew, MD 05/09/20 1740

## 2020-05-09 NOTE — ED Triage Notes (Signed)
Patient presents to the ED with sudden weakness that began yesterday evening as well as tightness in her neck.  Patient states when she woke up this morning she felt the same way and went to Urgent Care, urgent care performed an EKG that showed AFib.  Patient states she has had Afib once before.  Patient is alert and oriented x 4.  Skin is warm and dry.

## 2020-05-09 NOTE — ED Notes (Signed)
Pt up ambulating around room looking in cabinets for pillow when RN entered room. Pt requesting something to eat, pt given crackers, peanut butter, and ice water at this time. Pt also given warm blankets and pillows.

## 2020-05-09 NOTE — ED Notes (Signed)
Pt provided with meal tray and ice water. Pt is ambulatory to room toilet at this time.

## 2020-05-09 NOTE — Discharge Instructions (Addendum)
Go to the Emergency Department. 

## 2020-05-09 NOTE — H&P (Signed)
History and Physical  Patient Name: Alison Richardson     [id number]    DOB: [birth year]    DOA: 05/09/2020 PCP: Rusty Aus, MD   Patient coming from: [facility] living facility     Chief Complaint: Dyspnea on exertion and neck pain  HPI: Alison Richardson is a 86 y.o. F with DM, A. fib on amiodarone and Eliquis, and HTN who presents with 1 day of exertional discomfort in the neck and dyspnea on exertion.  Patient was in her usual state of health until last night around 6 PM she had sudden onset of "extreme fatigue".  There was no chest discomfort, but felt "deep tired".  She also noticed that she was out of breath with exertion, had had discomfort in her neck with exertion.  This persisted today, so she went to Urgent Care, where she was incorrectly thought to be in Afib and sent to the ER.  In the ER, ECG showed no ST changes.  Troponins negative.  The patient had blood glucose 84, heart rate 102, Covid negative, chest x-ray clear, urinalysis with scant bacteria and pyuria.  These were thought to be anginal equivalents and so the hospital service were asked to observe overnight.      Review of Systems:  Review of Systems  Constitutional: Negative for chills, fever and malaise/fatigue.  Respiratory: Positive for shortness of breath. Negative for cough, sputum production and wheezing.   Cardiovascular: Negative for chest pain, palpitations, orthopnea, leg swelling and PND.  Genitourinary: Negative for dysuria, flank pain, frequency, hematuria and urgency.  All other systems reviewed and are negative.    Past Medical History:  Diagnosis Date  . Arrhythmia   . Atrial flutter ([place]) 01/2018   new onset   . Basal cell carcinoma of back   . Basal cell carcinoma of lip   . Cerebral aneurysm    followed by [facility]  . DDD (degenerative disc disease), lumbar    superior plate depression, L3 08/18/2014  . Diabetes mellitus type II, controlled ([place])   . Diverticulosis   . Dysthymia    depression  . GERD (gastroesophageal reflux disease)   . History of meniscal tear   . Hyperlipidemia   . Hypertension   . Mild pulmonary hypertension ([place])   . Overactive bladder   . Seasonal allergic rhinitis     Past Surgical History:  Procedure Laterality Date  . APPENDECTOMY  1946  . BASAL CELL CARCINOMA EXCISION     2006 and 2009 removed from back and lip  . CARDIOVASCULAR STRESS TEST  2015   nuclear cardiac stress test negative for ischemia - Dr. Satira Sark  . CATARACT EXTRACTION, BILATERAL  2006  . COLONOSCOPY  2014  . [place]  . INJECTION KNEE Left 05/2015  . REPLACEMENT TOTAL KNEE Left 09/06/2016   Dr. Barnet Pall  . TONSILLECTOMY AND ADENOIDECTOMY  1953  . TOTAL ABDOMINAL HYSTERECTOMY  1986   [facility]    Social History: Patient lives in independent living.  Patient walks unassisted. She  reports that she has never smoked. She has never used smokeless tobacco. She reports previous alcohol use. No history on file for drug.  No Known Allergies  Family history: family history includes CAD in her father; Hypertension in her mother.  Prior to Admission medications   Medication Sig Start Date End Date Taking? Authorizing Provider  acetaminophen (TYLENOL) 650 MG CR tablet Take 650 mg by mouth every 8 (eight) hours as needed for pain.  [provider]  amiodarone (PACERONE) 200 MG tablet Take 200 mg by mouth daily.  01/02/19   [provider]  amLODipine (NORVASC) 5 MG tablet Take 5 mg by mouth daily. 09/21/19   [provider]  atorvastatin (LIPITOR) 10 MG tablet Take 10 mg by mouth daily.    [provider]  celecoxib (CELEBREX) 200 MG capsule Take 200 mg by mouth daily. 04/27/20   [provider]  donepezil (ARICEPT) 10 MG tablet Take 10 mg by mouth at bedtime.    [provider]  ELIQUIS 5 MG TABS tablet SMARTSIG:1 Tablet(s) By Mouth Every 12 Hours 05/06/20   [provider]  furosemide (LASIX) 20  MG tablet Take 20 mg by mouth daily. 09/23/19   [provider]  gabapentin (NEURONTIN) 100 MG capsule SMARTSIG:1 Capsule(s) By Mouth Every Evening 05/06/20   [provider]  hydrochlorothiazide (HYDRODIURIL) 12.5 MG tablet Take 12.5 mg by mouth daily.    [provider]  levothyroxine (SYNTHROID) 50 MCG tablet Take 50 mcg by mouth daily. 09/21/19   [provider]  levothyroxine (SYNTHROID) 88 MCG tablet Take 88 mcg by mouth daily before breakfast.    [provider]  metFORMIN (GLUCOPHAGE) 500 MG tablet Take 250 mg by mouth 2 (two) times daily. 03/18/20   [provider]  metoprolol succinate (TOPROL-XL) 25 MG 24 hr tablet Take 12.5 mg by mouth 2 (two) times daily.    [provider]  omeprazole (PRILOSEC) 20 MG capsule Take 20 mg by mouth daily.    [provider]  oxybutynin (DITROPAN) 5 MG tablet Take 5 mg by mouth daily.     [provider]  potassium chloride (K-DUR) 10 MEQ tablet Take 10 mEq by mouth 2 (two) times daily.     [provider]  rivastigmine (EXELON) 4.6 mg/24hr Place 4.6 mg onto the skin daily.    [provider]  venlafaxine XR (EFFEXOR-XR) 75 MG 24 hr capsule Take 75 mg by mouth daily with breakfast.     [provider]       Physical Exam: BP 130/79   Pulse 90   Temp 97.6 F (36.4 C) (Oral)   Resp 16   Ht 5\' 6"  (1.676 m)   Wt 77.1 kg   SpO2 92%   BMI 27.44 kg/m  General appearance: Well-developed overly adult female, alert and in no acute distress.   Eyes: Anicteric, conjunctiva pink, lids and lashes normal.     ENT: No nasal deformity, discharge, or epistaxis.  OP moist without lesions.   In good repair, lips normal. Skin: Warm and dry.   Cardiac: Tachycardic, regular, nl S1-S2, no murmurs appreciated.  Capillary refill is brisk.  JVP normal.  No LE edema.  Radial and DP pulses 2+ and symmetric.   Respiratory: Normal respiratory rate and rhythm.  CTAB  without rales or wheezes. GI: Abdomen soft without rigidity.  No TTP. No ascites, distension.   MSK: No deformities or effusions.   Pain not reproduced with palpation of precordium.  No pain with arm movement. Neuro: Sensorium intact and responding to questions, attention normal.  Speech is fluent.  Moves all extremities equally and with normal coordination.   Cranial nerves III through XII intact. Psych: Behavior appropriate.  Affect pleasant.  No evidence of aural or visual hallucinations or delusions.       Labs on Admission:  The metabolic panel shows normal electrolytes and renal function, slight hyperglycemia.. The complete blood count  shows mild leukocytosis 11.8K, normal hemoglobin and platelet count. The initial troponin is negative.  Radiological Exams on Admission: Personally reviewed chest x-ray shows no focal airspace disease or opacity, no pneumothorax: DG Chest Portable 1 View  Result Date: 05/09/2020 CLINICAL DATA:  Shortness of breath with exertion EXAM: PORTABLE CHEST 1 VIEW COMPARISON:  09/29/2019 FINDINGS: Heart and mediastinal contours are within normal limits. No focal opacities or effusions. No acute bony abnormality. IMPRESSION: No active disease. Electronically Signed   By: Rolm Baptise M.D.   On: 05/09/2020 17:14    EKG: Independently reviewed.  Rate elevated slightly, but sinus rhythm, long PR interval, no ST depressions.    Assessment/Plan Principal Problem:   DOE (dyspnea on exertion) Active Problems:   Paroxysmal atrial fibrillation (HCC)   Type 2 diabetes mellitus with diabetic neuropathy (HCC)   Benign essential hypertension  1.  Fatigue/dyspnea on exertion: This is an elderly patient with dyspnea on exertion and neck discomfort, possible anginal equivalent with negative troponins and normal EKG.  She also has risk factors of diabetes and hypertension.    Other potential causes of chest pain (pancreatitis, dissection, pneumonia/effusion,  pericarditis) are doubted.  Peers clinically benign.  She is on a blood thinner and is adherent, I doubt PE.  We have been asked to admit the patient for observation and etiology consultation with Cardiology tomorrow.   Alternative explanations for her fatigue include her new glipizide which was restarted the day prior to her symptom onset. -Serial troponins are ordered -Telemetry -Check lipid panel -Consult to cardiology, appreciate recommendations     2.  Atrial fibrillation, paroxysmal: Currently in sinus rhythm -Continue amiodarone, metoprolol, Eliquis  3.  Diabetes:  -Hold glipizide -Hold Metformin -Check Accu-Cheks 4 times daily overnight -Continue aspirin, atorvastatin  4.  Hypertension:  Blood pressure normal -Continue amlodipine, metoprolol, furosemide, potassium  5.  Memory impairment/mood:  -Continue donepezil, rivastigmine, venlafaxine      DVT prophylaxis: Not applicable due to on blood thinner already Diet: NPO after 4am for anticipated stress testing Code Status: DNR Family Communication: None at the bedside Disposition Plan: Anticipate overnight observation for arrhythmia on telemetry, serial troponins and subsequent risk stratification by Cardiology.  If testing negative, home after. Consults called: Cardiology, [facility] Admission status: Telemetry, observation   Medical decision making: Patient seen at 6:19 PM on 05/09/2020.  The patient was discussed with Dr. Joni Fears. What exists of the patient's chart was reviewed in depth.  Clinical condition: Hemodynamically stable, mentation good.      [place] [facility]

## 2020-05-09 NOTE — ED Provider Notes (Signed)
Roderic Palau    CSN: [id number] Arrival date & time: 05/09/20  1025      History   Chief Complaint No chief complaint on file.   HPI Alison Richardson is a 86 y.o. female.   Patient presents with shortness of breath and feeling like "something is not right" since yesterday evening.  She reports heart palpitations.  She has a history of atrial fibrillation which was diagnosed in the hospital 1.5 years ago; she has not had symptoms since then.  She denies focal weakness, asymmetry, facial droop, chest pain, edema, or other symptoms.  No treatments attempted at home.  Patient's medical history is complicated and includes hypertension, diabetes, cerebral aneurysm, arrhythmia, pulmonary hypertension, PSVT, syncope, A-fib.  The history is provided by the patient.    Past Medical History:  Diagnosis Date  . Arrhythmia   . Atrial flutter ([place]) 01/2018   new onset   . Basal cell carcinoma of back   . Basal cell carcinoma of lip   . Cerebral aneurysm    followed by [facility]  . DDD (degenerative disc disease), lumbar    superior plate depression, L3 08/18/2014  . Diabetes mellitus type II, controlled ([place])   . Diverticulosis   . Dysthymia    depression  . GERD (gastroesophageal reflux disease)   . History of meniscal tear   . Hyperlipidemia   . Hypertension   . Mild pulmonary hypertension ([place])   . Overactive bladder   . Seasonal allergic rhinitis     Patient Active Problem List   Diagnosis Date Noted  . Syncope and collapse 09/30/2019  . Syncope 09/29/2019  . Diabetes mellitus type 2 in nonobese ([place]) 01/21/2019  . Dyslipidemia 01/21/2019  . PSVT (paroxysmal supraventricular tachycardia) ([place]) 01/21/2019  . Tenosynovitis of foot 01/21/2019  . PAD (peripheral artery disease) ([place]) 01/06/2019  . Preop testing 01/06/2019  . DM type 2 with diabetic mixed hyperlipidemia ([place]) 09/08/2018  . Peripheral neuropathy, idiopathic 09/08/2018  . Anxiety disorder 06/19/2018  .  Depression, major, single episode, mild ([place]) 06/19/2018  . Paroxysmal atrial fibrillation ([place]) 06/19/2018  . Atrial flutter ([place]) 06/19/2018  . Pure hypercholesterolemia 06/19/2018  . Vitamin B12 deficiency 06/19/2018  . Insomnia 06/19/2018  . Protein-calorie malnutrition ([place]) 06/19/2018  . Type 2 diabetes mellitus with diabetic neuropathy ([place]) 06/19/2018  . Knee pain 06/19/2018  . Cognitive impairment, mild, so stated 06/19/2018  . Osteoarthritis of right knee 06/19/2018  . Benign essential hypertension 06/19/2018  . Hypothyroidism 06/19/2018  . Overactive bladder 06/19/2018  . Generalized muscle weakness 06/19/2018  . Difficulty in walking, not elsewhere classified 06/19/2018  . Major depression in remission ([place]) 06/18/2018  . Primary osteoarthritis involving multiple joints 06/18/2018  . Mixed hyperlipidemia 11/01/2015  . Cerebral aneurysm 04/29/2013    Past Surgical History:  Procedure Laterality Date  . APPENDECTOMY  1946  . BASAL CELL CARCINOMA EXCISION     2006 and 2009 removed from back and lip  . CARDIOVASCULAR STRESS TEST  2015   nuclear cardiac stress test negative for ischemia - Dr. Satira Sark  . CATARACT EXTRACTION, BILATERAL  2006  . COLONOSCOPY  2014  . [place]  . INJECTION KNEE Left 05/2015  . REPLACEMENT TOTAL KNEE Left 09/06/2016   Dr. Barnet Pall  . TONSILLECTOMY AND ADENOIDECTOMY  1953  . TOTAL ABDOMINAL HYSTERECTOMY  1986   [facility]    OB History   No obstetric history on file.      Home Medications  Prior to Admission medications   Medication Sig Start Date End Date Taking? Authorizing Provider  hydrochlorothiazide (HYDRODIURIL) 12.5 MG tablet Take 12.5 mg by mouth daily.   Yes [provider]  levothyroxine (SYNTHROID) 88 MCG tablet Take 88 mcg by mouth daily before breakfast.   Yes [provider]  acetaminophen (TYLENOL) 650 MG CR tablet Take 650 mg by mouth every 8 (eight) hours as needed for pain.     [provider]  amiodarone (PACERONE) 200 MG tablet Take 200 mg by mouth daily.  01/02/19   [provider]  amLODipine (NORVASC) 5 MG tablet Take 5 mg by mouth daily. 09/21/19   [provider]  atorvastatin (LIPITOR) 10 MG tablet Take 10 mg by mouth daily.    [provider]  celecoxib (CELEBREX) 200 MG capsule Take 200 mg by mouth daily. 04/27/20   [provider]  donepezil (ARICEPT) 10 MG tablet Take 10 mg by mouth at bedtime.    [provider]  ELIQUIS 5 MG TABS tablet SMARTSIG:1 Tablet(s) By Mouth Every 12 Hours 05/06/20   [provider]  furosemide (LASIX) 20 MG tablet Take 20 mg by mouth daily. 09/23/19   [provider]  gabapentin (NEURONTIN) 100 MG capsule SMARTSIG:1 Capsule(s) By Mouth Every Evening 05/06/20   [provider]  levothyroxine (SYNTHROID) 50 MCG tablet Take 50 mcg by mouth daily. 09/21/19   [provider]  metFORMIN (GLUCOPHAGE) 500 MG tablet Take 250 mg by mouth 2 (two) times daily. 03/18/20   [provider]  metoprolol succinate (TOPROL-XL) 25 MG 24 hr tablet Take 12.5 mg by mouth 2 (two) times daily.    [provider]  omeprazole (PRILOSEC) 20 MG capsule Take 20 mg by mouth daily.    [provider]  oxybutynin (DITROPAN) 5 MG tablet Take 5 mg by mouth daily.     [provider]  potassium chloride (K-DUR) 10 MEQ tablet Take 10 mEq by mouth 2 (two) times daily.     [provider]  rivastigmine (EXELON) 4.6 mg/24hr Place 4.6 mg onto the skin daily.    [provider]  venlafaxine XR (EFFEXOR-XR) 75 MG 24 hr capsule Take 75 mg by mouth daily with breakfast.     [provider]    Family History Family History  Problem Relation Age of Onset  . Hypertension Mother   . CAD Father     Social History Social History   Tobacco Use  . Smoking status: Never Smoker  . Smokeless tobacco: Never Used  Substance Use Topics   . Alcohol use: Not Currently  . Drug use: Not on file     Allergies   Patient has no known allergies.   Review of Systems Review of Systems  Constitutional: Negative for chills and fever.  HENT: Negative for ear pain and sore throat.   Eyes: Negative for pain and visual disturbance.  Respiratory: Positive for shortness of breath. Negative for cough.   Cardiovascular: Positive for palpitations. Negative for chest pain.  Gastrointestinal: Negative for abdominal pain and vomiting.  Genitourinary: Negative for dysuria and hematuria.  Musculoskeletal: Negative for arthralgias and back pain.  Skin: Negative for color change and rash.  Neurological: Negative for dizziness, seizures, syncope, facial asymmetry, weakness and numbness.  All other systems reviewed and are negative.    Physical Exam Triage Vital Signs ED Triage Vitals  Enc Vitals Group     BP      Pulse  Resp      Temp      Temp src      SpO2      Weight      Height      Head Circumference      Peak Flow      Pain Score      Pain Loc      Pain Edu?      Excl. in [place]?    No data found.  Updated Vital Signs BP 129/81 (BP Location: Right Arm)   Pulse 99   Temp 97.7 F (36.5 C) (Oral)   Resp 18   SpO2 95%   Visual Acuity Right Eye Distance:   Left Eye Distance:   Bilateral Distance:    Right Eye Near:   Left Eye Near:    Bilateral Near:     Physical Exam Vitals and nursing note reviewed.  Constitutional:      General: She is not in acute distress.    Appearance: She is well-developed.  HENT:     Head: Normocephalic and atraumatic.     Mouth/Throat:     Mouth: Mucous membranes are moist.     Pharynx: Oropharynx is clear.  Eyes:     Conjunctiva/sclera: Conjunctivae normal.  Cardiovascular:     Rate and Rhythm: Normal rate and regular rhythm.     Heart sounds: No murmur.  Pulmonary:     Effort: Pulmonary effort is normal. No respiratory distress.     Breath sounds: Normal breath  sounds.  Abdominal:     Palpations: Abdomen is soft.     Tenderness: There is no abdominal tenderness. There is no guarding or rebound.  Musculoskeletal:     Cervical back: Neck supple.     Right lower leg: No edema.     Left lower leg: No edema.  Skin:    General: Skin is warm and dry.     Findings: No rash.  Neurological:     General: No focal deficit present.     Mental Status: She is alert and oriented to person, place, and time.     Sensory: No sensory deficit.     Motor: No weakness.     Coordination: Coordination normal.     Gait: Gait normal.  Psychiatric:        Mood and Affect: Mood normal.        Behavior: Behavior normal.      UC Treatments / Results  Labs (all labs ordered are listed, but only abnormal results are displayed) Labs Reviewed - No data to display  EKG   Radiology No results found.  Procedures Procedures (including critical care time)  Medications Ordered in UC Medications - No data to display  Initial Impression / Assessment and Plan / UC Course  I have reviewed the triage vital signs and the nursing notes.  Pertinent labs & imaging results that were available during my care of the patient were reviewed by me and considered in my medical decision making (see chart for details).   Shortness of breath, heart palpitations, atrial fibrillation.  EKG shows Atrial fibrillation, rate 103, no ST elevation, compared to previous from 09/29/2019.  Sending patient to the ED.  She declines EMS; her friend drove her here and will drive her to the ED.       Final Clinical Impressions(s) / UC Diagnoses   Final diagnoses:  Shortness of breath  Palpitations  Atrial fibrillation, unspecified type [facility])     Discharge  Instructions     Go to the Emergency Department.        ED Prescriptions    None     PDMP not reviewed this encounter.   Sharion Balloon, NP 05/09/20 1059

## 2020-05-09 NOTE — ED Notes (Signed)
Pt reports shortness of breath and "racing heart' since last night. Pt the shortness of breath do not improve with rest.

## 2020-05-09 NOTE — ED Triage Notes (Signed)
Pt reports shortness of breath and "racing heart' since last night. Pt the shortness of breath do not improve with rest.

## 2020-05-10 ENCOUNTER — Encounter: Payer: Self-pay | Admitting: Family Medicine

## 2020-05-10 DIAGNOSIS — R0600 Dyspnea, unspecified: Secondary | ICD-10-CM | POA: Diagnosis not present

## 2020-05-10 LAB — LIPID PANEL
Cholesterol: 166 mg/dL (ref 0–200)
HDL: 51 mg/dL (ref >40)
LDL Cholesterol: 98 mg/dL (ref 0–99)
Total CHOL/HDL Ratio: 3.3 RATIO
Triglycerides: 84 mg/dL (ref <150)
VLDL: 17 mg/dL (ref 0–40)

## 2020-05-10 LAB — GLUCOSE, CAPILLARY: Glucose-Capillary: 123 mg/dL — ABNORMAL HIGH (ref 70–99)

## 2020-05-10 MED ORDER — FUROSEMIDE 20 MG PO TABS: 20.0000 mg | ORAL_TABLET | Freq: Every day | ORAL | Status: DC | PRN

## 2020-05-10 MED ORDER — METOPROLOL SUCCINATE ER 25 MG PO TB24: 25.0000 mg | ORAL_TABLET | Freq: Two times a day (BID) | ORAL | 3 refills | Status: DC

## 2020-05-10 MED ORDER — HYDROCHLOROTHIAZIDE 25 MG PO TABS
25.0000 mg | ORAL_TABLET | Freq: Every day | ORAL | Status: DC
  Administered 2020-05-10: 25 mg via ORAL

## 2020-05-10 MED ORDER — METOPROLOL SUCCINATE ER 25 MG PO TB24
25.0000 mg | ORAL_TABLET | Freq: Two times a day (BID) | ORAL | Status: DC
  Administered 2020-05-10: 25 mg via ORAL

## 2020-05-10 MED ORDER — AMLODIPINE BESYLATE 5 MG PO TABS: 5.0000 mg | ORAL_TABLET | Freq: Every day | ORAL | 3 refills | Status: DC

## 2020-05-10 MED ORDER — ASPIRIN 81 MG PO TBEC: 81.0000 mg | DELAYED_RELEASE_TABLET | Freq: Every day | ORAL | Status: DC

## 2020-05-10 NOTE — Progress Notes (Signed)
Pt d/c to home via daughter. IV removed intact. VSS. Education completed.

## 2020-05-10 NOTE — Discharge Summary (Signed)
Physician Discharge Summary  Adja Dadisman [id number] DOB: [birth year] DOA: 05/09/2020  PCP: Rusty Aus, MD  Admit date: 05/09/2020 Discharge date: 05/10/2020  Admitted From: Independent living  Disposition:  Independent living   Recommendations for Outpatient Follow-up:  1. Follow up with primary cardiologist Dr. Ubaldo Glassing in 1-2 weeks 2. Dr. Sabra Heck: Please review new glipizide, for side effects   Home Health: None  Equipment/Devices: None  Discharge Condition: Good  CODE STATUS: DO NOT RESUSCITATE Diet recommendation: Cardiac  Brief/Interim Summary: Mrs. Geraty is a 86 y.o. F with DM, A. fib on amiodarone and Eliquis, and HTN who presents with 1 day of exertional discomfort in the neck and dyspnea on exertion.  Patient was in her usual state of health until the night prior to admission, around 6 PM she had sudden onset of "extreme fatigue".  There was no chest discomfort, but felt "deep tired".  She also noticed that she was out of breath with exertion, had had discomfort in her neck with exertion.  This persisted, so she went to Urgent Care, where she was incorrectly thought to be in Afib and sent to the ER.  In the ER, ECG showed no ST changes.  Troponins negative.  The patient had blood glucose 84, heart rate 102, Covid negative, chest x-ray clear, urinalysis with scant bacteria and pyuria.  No urinary irritative symptoms or abdominal pain at all.  Her exertional symptoms were thought to be anginal equivalents and so the hospital service were asked to observe overnight.     PRINCIPAL HOSPITAL DIAGNOSIS: Fatigue, possible glipizide side effect    Discharge Diagnoses:   Fatigue/dyspnea on exertion: ECG unchanged from prior and troponins normal.  Observed overnight.  Symptoms resolved by morning.  Cardiology evaluated patient and felt ischemia was extremely unlikely, recommended primary cardiologist follow up as an outpatient.  PE doubted on Eliquis.      Atrial  fibrillation, paroxysmal: Currently in sinus rhythm.  Continued on amiodarone, Eliquis.  Metoprolol dose increased. -Continue amiodarone, metoprolol, Eliquis  Diabetes: Continue metformin, glpizide, follow up with PCP. Continue aspirin and statin.  Hypertension:  Continue amlodipine, metoprolol, furosemide, potassium  Memory impairment/mood:  Continue donepezil, rivastigmine, venlafaxine             Discharge Instructions  Discharge Instructions    Diet - low sodium heart healthy   Complete by: As directed    Discharge instructions   Complete by: As directed    From Dr. Loleta Books: You were seen for feeling weak and tired and neck pain with walking. We were concerned that this was from your heart.  Dr. Bethanne Ginger partner was able to see you this morning and was confident that your symptoms were not from your heart.  You should follow up with Dr. Ubaldo Glassing and Dr. Sabra Heck next week   Increase activity slowly   Complete by: As directed      Allergies as of 05/10/2020   No Known Allergies     Medication List    TAKE these medications   amiodarone 200 MG tablet Commonly known as: PACERONE Take 200 mg by mouth daily.   amLODipine 5 MG tablet Commonly known as: NORVASC Take 1 tablet (5 mg total) by mouth daily.   aspirin 81 MG EC tablet Take 1 tablet (81 mg total) by mouth daily. Start taking on: May 11, 2020   atorvastatin 10 MG tablet Commonly known as: LIPITOR Take 10 mg by mouth daily.   celecoxib 200 MG capsule Commonly known as: CELEBREX  Take 200 mg by mouth daily.   donepezil 10 MG tablet Commonly known as: ARICEPT Take 10 mg by mouth at bedtime.   Eliquis 5 MG Tabs tablet Generic drug: apixaban Take 5 mg by mouth 2 (two) times daily.   furosemide 20 MG tablet Commonly known as: LASIX Take 1 tablet (20 mg total) by mouth daily as needed for edema. What changed:   when to take this  reasons to take this   gabapentin 100 MG capsule Commonly  known as: NEURONTIN SMARTSIG:1 Capsule(s) By Mouth Every Evening   glipiZIDE 5 MG tablet Commonly known as: GLUCOTROL Take 5 mg by mouth every morning.   hydrochlorothiazide 12.5 MG tablet Commonly known as: HYDRODIURIL Take 12.5 mg by mouth daily.   levothyroxine 88 MCG tablet Commonly known as: SYNTHROID Take 88 mcg by mouth daily.   metFORMIN 1000 MG tablet Commonly known as: GLUCOPHAGE Take 1,000 mg by mouth daily with breakfast.   metoprolol succinate 25 MG 24 hr tablet Commonly known as: TOPROL-XL Take 1 tablet (25 mg total) by mouth 2 (two) times daily. What changed: how much to take   omeprazole 40 MG capsule Commonly known as: PRILOSEC Take 40 mg by mouth in the morning and at bedtime.   oxybutynin 5 MG tablet Commonly known as: DITROPAN Take 5 mg by mouth daily.   potassium chloride 10 MEQ tablet Commonly known as: KLOR-CON Take 10 mEq by mouth 2 (two) times daily.   venlafaxine XR 75 MG 24 hr capsule Commonly known as: EFFEXOR-XR Take 75 mg by mouth daily with breakfast.      Follow-up Information    Rusty Aus, MD. Go on 05/11/2020.   Specialty: Internal Medicine Why: @ 9:30am Contact information: [facility] [place] [address] [phone]        Teodoro Spray, MD. Daphane Shepherd on 05/18/2020.   Specialty: Cardiology Why: @ 3:30pm Contact information: [place] [address] [phone]          No Known Allergies  Consultations:  Cardiology   Procedures/Studies: DG Chest Portable 1 View  Result Date: 05/09/2020 CLINICAL DATA:  Shortness of breath with exertion EXAM: PORTABLE CHEST 1 VIEW COMPARISON:  09/29/2019 FINDINGS: Heart and mediastinal contours are within normal limits. No focal opacities or effusions. No acute bony abnormality. IMPRESSION: No active disease. Electronically Signed   By: Rolm Baptise M.D.   On: 05/09/2020 17:14       Subjective: Feeling  well.  No chest pain, no further neck pain, no dyspnea on exertion.  Ambulated in hall and felt at baseline.  Discharge Exam: Vitals:   05/10/20 0032 05/10/20 0737  BP: (!) 152/109 (!) 176/109  Pulse: 99 (!) 104  Resp: 20 15  Temp: 98 F (36.7 C) 98.2 F (36.8 C)  SpO2: 98% 95%   Vitals:   05/10/20 0002 05/10/20 0032 05/10/20 0100 05/10/20 0737  BP: (!) 141/95 (!) 152/109  (!) 176/109  Pulse: (!) 102 99  (!) 104  Resp: 15 20  15   Temp:  98 F (36.7 C)  98.2 F (36.8 C)  TempSrc:  Oral  Oral  SpO2: 98% 98%  95%  Weight:   78.5 kg   Height:   5\' 6"  (1.676 m)     General: Pt is alert, awake, not in acute distress Cardiovascular: RRR, nl S1-S2, no murmurs appreciated.   No LE edema.   Respiratory: Normal respiratory rate and rhythm.  CTAB  without rales or wheezes. Abdominal: Abdomen soft and non-tender.  No distension or HSM.   Neuro/Psych: Strength symmetric in upper and lower extremities.  Judgment and insight appear normal.   The results of significant diagnostics from this hospitalization (including imaging, microbiology, ancillary and laboratory) are listed below for reference.     Microbiology: Recent Results (from the past 240 hour(s))  SARS Coronavirus 2 by RT PCR (hospital order, performed in [facility] hospital lab) Nasopharyngeal Nasopharyngeal Swab     Status: None   Collection Time: 05/09/20  4:15 PM   Specimen: Nasopharyngeal Swab  Result Value Ref Range Status   SARS Coronavirus 2 NEGATIVE NEGATIVE Final    Comment: (NOTE) SARS-CoV-2 target nucleic acids are NOT DETECTED. The SARS-CoV-2 RNA is generally detectable in upper and lower respiratory specimens during the acute phase of infection. The lowest concentration of SARS-CoV-2 viral copies this assay can detect is 250 copies / mL. A negative result does not preclude SARS-CoV-2 infection and should not be used as the sole basis for treatment or other patient management decisions.  A negative result may  occur with improper specimen collection / handling, submission of specimen other than nasopharyngeal swab, presence of viral mutation(s) within the areas targeted by this assay, and inadequate number of viral copies (<250 copies / mL). A negative result must be combined with clinical observations, patient history, and epidemiological information. Fact Sheet for Patients:   StrictlyIdeas.no Fact Sheet for Healthcare Providers: BankingDealers.co.za This test is not yet approved or cleared  by the [place] FDA and has been authorized for detection and/or diagnosis of SARS-CoV-2 by FDA under an Emergency Use Authorization (EUA).  This EUA will remain in effect (meaning this test can be used) for the duration of the COVID-19 declaration under Section 564(b)(1) of the Act, 21 U.S.C. section 360bbb-3(b)(1), unless the authorization is terminated or revoked sooner. Performed at [facility], [address] [place], [place] [address]      Labs: BNP (last 3 results) No results for input(s): BNP in the last 8760 hours. Basic Metabolic Panel: Recent Labs  Lab 05/09/20 1127  NA 140  K 3.7  CL 108  CO2 23  GLUCOSE 84  BUN 19  CREATININE 0.74  CALCIUM 9.1   Liver Function Tests: No results for input(s): AST, ALT, ALKPHOS, BILITOT, PROT, ALBUMIN in the last 168 hours. No results for input(s): LIPASE, AMYLASE in the last 168 hours. No results for input(s): AMMONIA in the last 168 hours. CBC: Recent Labs  Lab 05/09/20 1127  WBC 11.8*  HGB 15.1*  HCT 43.4  MCV 84.9  PLT 323   Cardiac Enzymes: No results for input(s): CKTOTAL, CKMB, CKMBINDEX, TROPONINI in the last 168 hours. BNP: Invalid input(s): POCBNP CBG: Recent Labs  Lab 05/09/20 1836 05/09/20 2314 05/10/20 0740  GLUCAP 178* 114* 123*   D-Dimer No results for input(s): DDIMER in the last 72 hours. Hgb A1c No results for input(s): HGBA1C in the last 72  hours. Lipid Profile Recent Labs    05/10/20 0543  CHOL 166  HDL 51  LDLCALC 98  TRIG 84  CHOLHDL 3.3   Thyroid function studies No results for input(s): TSH, T4TOTAL, T3FREE, THYROIDAB in the last 72 hours.  Invalid input(s): FREET3 Anemia work up No results for input(s): VITAMINB12, FOLATE, FERRITIN, TIBC, IRON, RETICCTPCT in the last 72 hours. Urinalysis    Component Value Date/Time   COLORURINE YELLOW (A) 05/09/2020 1159   APPEARANCEUR HAZY (A) 05/09/2020 1159  LABSPEC 1.018 05/09/2020 1159   PHURINE 5.0 05/09/2020 1159   GLUCOSEU NEGATIVE 05/09/2020 1159   HGBUR SMALL (A) 05/09/2020 1159   BILIRUBINUR NEGATIVE 05/09/2020 1159   KETONESUR NEGATIVE 05/09/2020 1159   PROTEINUR NEGATIVE 05/09/2020 1159   NITRITE NEGATIVE 05/09/2020 1159   LEUKOCYTESUR LARGE (A) 05/09/2020 1159   Sepsis Labs Invalid input(s): PROCALCITONIN,  WBC,  LACTICIDVEN Microbiology Recent Results (from the past 240 hour(s))  SARS Coronavirus 2 by RT PCR (hospital order, performed in [facility] hospital lab) Nasopharyngeal Nasopharyngeal Swab     Status: None   Collection Time: 05/09/20  4:15 PM   Specimen: Nasopharyngeal Swab  Result Value Ref Range Status   SARS Coronavirus 2 NEGATIVE NEGATIVE Final    Comment: (NOTE) SARS-CoV-2 target nucleic acids are NOT DETECTED. The SARS-CoV-2 RNA is generally detectable in upper and lower respiratory specimens during the acute phase of infection. The lowest concentration of SARS-CoV-2 viral copies this assay can detect is 250 copies / mL. A negative result does not preclude SARS-CoV-2 infection and should not be used as the sole basis for treatment or other patient management decisions.  A negative result may occur with improper specimen collection / handling, submission of specimen other than nasopharyngeal swab, presence of viral mutation(s) within the areas targeted by this assay, and inadequate number of viral copies (<250 copies / mL). A  negative result must be combined with clinical observations, patient history, and epidemiological information. Fact Sheet for Patients:   StrictlyIdeas.no Fact Sheet for Healthcare Providers: BankingDealers.co.za This test is not yet approved or cleared  by the [place] FDA and has been authorized for detection and/or diagnosis of SARS-CoV-2 by FDA under an Emergency Use Authorization (EUA).  This EUA will remain in effect (meaning this test can be used) for the duration of the COVID-19 declaration under Section 564(b)(1) of the Act, 21 U.S.C. section 360bbb-3(b)(1), unless the authorization is terminated or revoked sooner. Performed at [facility], [address] [place], [place] [address]      Time coordinating discharge: 25 minutes      SIGNED:   Edwin Dada, MD  [facility] 05/10/2020, 10:46 AM

## 2020-05-10 NOTE — Consult Note (Signed)
[facility] Consultation Note  Patient ID: Alison Richardson, MRN: [id number], DOB/AGE: Oct 03, 1933 86 y.o. Admit date: 05/09/2020   Date of Consult: 05/10/2020 Primary Physician: Rusty Aus, MD Primary Cardiologist: Fath  Chief Complaint:  Chief Complaint  Patient presents with  . Weakness   Reason for Consult: Atrial fibrillation  HPI: 86 y.o. female with known paroxysmal nonvalvular atrial fibrillation hypertension hyperlipidemia on appropriate medication management currently on amiodarone and maintaining normal sinus rhythm.  Additionally the patient is on Eliquis for further risk reduction in stroke with atrial fibrillation and stable without bleeding complications.  Hypertension control has been achieved with amlodipine metoprolol.  The patient does have occasional lower extremity edema with furosemide use and currently stable without evidence of new onset of congestive heart failure.  She does use atorvastatin for further risk reduction cardiovascular event with stable treatment at this time.  The patient was doing well when yesterday she had an episode of feeling not so good week and ended up with having some funny feeling in her neck.  With this sort of dizziness unsteadiness the patient was seen in the acute care at her facility.  At that time they felt like there was an irregular EKG and was sent to the emergency room.  In the emergency room the patient had full resolution of her symptoms with an EKG showing normal sinus rhythm with left anterior fascicular block unchanged from before.  Chest x-ray was normal troponin was 13 and the patient has not had any congestive heart failure or anginal symptoms at this time.  Overnight she slept well and has been comfortable on appropriate medications listed above  Past Medical History:  Diagnosis Date  . Arrhythmia   . Atrial flutter ([place]) 01/2018   new onset   . Basal cell carcinoma of back   . Basal cell carcinoma of lip   .  Cerebral aneurysm    followed by [facility]  . DDD (degenerative disc disease), lumbar    superior plate depression, L3 08/18/2014  . Diabetes mellitus type II, controlled ([place])   . Diverticulosis   . Dysthymia    depression  . GERD (gastroesophageal reflux disease)   . History of meniscal tear   . Hyperlipidemia   . Hypertension   . Mild pulmonary hypertension ([place])   . Overactive bladder   . Seasonal allergic rhinitis       Surgical History:  Past Surgical History:  Procedure Laterality Date  . APPENDECTOMY  1946  . BASAL CELL CARCINOMA EXCISION     2006 and 2009 removed from back and lip  . CARDIOVASCULAR STRESS TEST  2015   nuclear cardiac stress test negative for ischemia - Dr. Satira Sark  . CATARACT EXTRACTION, BILATERAL  2006  . COLONOSCOPY  2014  . [place]  . INJECTION KNEE Left 05/2015  . REPLACEMENT TOTAL KNEE Left 09/06/2016   Dr. Barnet Pall  . TONSILLECTOMY AND ADENOIDECTOMY  1953  . TOTAL ABDOMINAL HYSTERECTOMY  1986   [facility]     Home Meds: Prior to Admission medications   Medication Sig Start Date End Date Taking? Authorizing Provider  amiodarone (PACERONE) 200 MG tablet Take 200 mg by mouth daily.  01/02/19  Yes [provider]  atorvastatin (LIPITOR) 10 MG tablet Take 10 mg by mouth daily.   Yes [provider]  celecoxib (CELEBREX) 200 MG capsule Take 200 mg by mouth daily. 04/27/20  Yes [provider]  donepezil (ARICEPT) 10 MG tablet  Take 10 mg by mouth at bedtime.   Yes [provider]  ELIQUIS 5 MG TABS tablet Take 5 mg by mouth 2 (two) times daily.  05/06/20  Yes [provider]  furosemide (LASIX) 20 MG tablet Take 20 mg by mouth daily. 09/23/19  Yes [provider]  gabapentin (NEURONTIN) 100 MG capsule SMARTSIG:1 Capsule(s) By Mouth Every Evening 05/06/20  Yes [provider]  glipiZIDE (GLUCOTROL) 5 MG tablet Take 5 mg by mouth every morning. 05/04/20  Yes [provider]   hydrochlorothiazide (HYDRODIURIL) 12.5 MG tablet Take 12.5 mg by mouth daily.   Yes [provider]  levothyroxine (SYNTHROID) 88 MCG tablet Take 88 mcg by mouth daily.  09/21/19  Yes [provider]  metFORMIN (GLUCOPHAGE) 1000 MG tablet Take 1,000 mg by mouth daily with breakfast.  03/18/20  Yes [provider]  metoprolol succinate (TOPROL-XL) 25 MG 24 hr tablet Take 12.5 mg by mouth 2 (two) times daily.   Yes [provider]  omeprazole (PRILOSEC) 40 MG capsule Take 40 mg by mouth in the morning and at bedtime.    Yes [provider]  oxybutynin (DITROPAN) 5 MG tablet Take 5 mg by mouth daily.    Yes [provider]  potassium chloride (K-DUR) 10 MEQ tablet Take 10 mEq by mouth 2 (two) times daily.    Yes [provider]  venlafaxine XR (EFFEXOR-XR) 75 MG 24 hr capsule Take 75 mg by mouth daily with breakfast.    Yes [provider]    Inpatient Medications:  . amiodarone  200 mg Oral Daily  . amLODipine  5 mg Oral Daily  . apixaban  5 mg Oral BID  . aspirin EC  81 mg Oral Daily  . atorvastatin  10 mg Oral Daily  . donepezil  10 mg Oral QHS  . furosemide  20 mg Oral Daily  . insulin aspart  0-6 Units Subcutaneous TID WC  . metoprolol succinate  12.5 mg Oral BID  . potassium chloride  10 mEq Oral BID  . venlafaxine XR  75 mg Oral Q breakfast     Allergies: No Known Allergies  Social History   Socioeconomic History  . Marital status: Married    Spouse name: Not on file  . Number of children: 5  . Years of education: Not on file  . Highest education level: Not on file  Occupational History  . Not on file  Tobacco Use  . Smoking status: Never Smoker  . Smokeless tobacco: Never Used  Substance and Sexual Activity  . Alcohol use: Not Currently  . Drug use: Not on file  . Sexual activity: Not on file  Other Topics Concern  . Not on file  Social History Narrative  . Not on file   Social Determinants  of Health   Financial Resource Strain:   . Difficulty of Paying Living Expenses:   Food Insecurity:   . Worried About Charity fundraiser in the Last Year:   . Arboriculturist in the Last Year:   Transportation Needs:   . Film/video editor (Medical):   Marland Kitchen Lack of Transportation (Non-Medical):   Physical Activity:   . Days of Exercise per Week:   . Minutes of Exercise per Session:   Stress:   . Feeling of Stress :   Social Connections:   . Frequency of Communication with Friends and Family:   . Frequency of Social Gatherings with Friends and Family:   .  Attends Religious Services:   . Active Member of Clubs or Organizations:   . Attends Archivist Meetings:   Marland Kitchen Marital Status:   Intimate Partner Violence:   . Fear of Current or Ex-Partner:   . Emotionally Abused:   Marland Kitchen Physically Abused:   . Sexually Abused:      Family History  Problem Relation Age of Onset  . Hypertension Mother   . CAD Father      Review of Systems Positive for weakness Negative for: General:  chills, fever, night sweats or weight changes.  Cardiovascular: PND orthopnea syncope dizziness  Dermatological skin lesions rashes Respiratory: Cough congestion Urologic: Frequent urination urination at night and hematuria Abdominal: negative for nausea, vomiting, diarrhea, bright red blood per rectum, melena, or hematemesis Neurologic: negative for visual changes, and/or hearing changes  All other systems reviewed and are otherwise negative except as noted above.  Labs: No results for input(s): CKTOTAL, CKMB, TROPONINI in the last 72 hours. Lab Results  Component Value Date   WBC 11.8 (H) 05/09/2020   HGB 15.1 (H) 05/09/2020   HCT 43.4 05/09/2020   MCV 84.9 05/09/2020   PLT 323 05/09/2020    Recent Labs  Lab 05/09/20 1127  NA 140  K 3.7  CL 108  CO2 23  BUN 19  CREATININE 0.74  CALCIUM 9.1  GLUCOSE 84   Lab Results  Component Value Date   CHOL 166 05/10/2020   HDL 51  05/10/2020   LDLCALC 98 05/10/2020   TRIG 84 05/10/2020   No results found for: DDIMER  Radiology/Studies:  DG Chest Portable 1 View  Result Date: 05/09/2020 CLINICAL DATA:  Shortness of breath with exertion EXAM: PORTABLE CHEST 1 VIEW COMPARISON:  09/29/2019 FINDINGS: Heart and mediastinal contours are within normal limits. No focal opacities or effusions. No acute bony abnormality. IMPRESSION: No active disease. Electronically Signed   By: Rolm Baptise M.D.   On: 05/09/2020 17:14    EKG: Normal sinus rhythm left and left anterior fascicular block  Weights: Filed Weights   05/09/20 1125 05/10/20 0100  Weight: 77.1 kg 78.5 kg     Physical Exam: Blood pressure (!) 176/109, pulse (!) 104, temperature 98.2 F (36.8 C), temperature source Oral, resp. rate 15, height 5\' 6"  (1.676 m), weight 78.5 kg, SpO2 95 %. Body mass index is 27.93 kg/m. General: Well developed, well nourished, in no acute distress. Head eyes ears nose throat: Normocephalic, atraumatic, sclera non-icteric, no xanthomas, nares are without discharge. No apparent thyromegaly and/or mass  Lungs: Normal respiratory effort.  no wheezes, no rales, no rhonchi.  Heart: RRR with normal S1 S2. no murmur gallop, no rub, PMI is normal size and placement, carotid upstroke normal without bruit, jugular venous pressure is normal Abdomen: Soft, non-tender, non-distended with normoactive bowel sounds. No hepatomegaly. No rebound/guarding. No obvious abdominal masses. Abdominal aorta is normal size without bruit Extremities: No edema. no cyanosis, no clubbing, no ulcers  Peripheral : 2+ bilateral upper extremity pulses, 2+ bilateral femoral pulses, 2+ bilateral dorsal pedal pulse Neuro: Alert and oriented. No facial asymmetry. No focal deficit. Moves all extremities spontaneously. Musculoskeletal: Normal muscle tone without kyphosis Psych:  Responds to questions appropriately with a normal affect.    Assessment: 86 year old female  with hypertension hyperlipidemia and paroxysmal nonvalvular atrial fibrillation on appropriate medication management doing quite well with feeling of weakness and no current evidence of congestive heart failure or myocardial infarction  Plan: 1.  Continue amiodarone at current dose with metoprolol  for heart rate control and maintenance of normal sinus rhythm 2.  High intensity cholesterol therapy for cardiovascular disease risk reduction 3.  Anticoagulation for further risk reduction stroke with atrial fibrillation without change 4.  Furosemide for fluid balances as before 5.  Begin ambulation and follow-up for improvements of symptoms and if no further symptoms at all okay for discharged home due to no evidence of congestive heart failure or anginal equivalent or myocardial infarction at this time on appropriate medication management with follow-up later this week for adjustments of medication management as necessary.  This also includes the possibility of Holter monitor for assessment of rhythm disturbances and/or side effects or need for adjustments of medications  Signed, Corey Skains M.D. [facility] 05/10/2020, 9:00 AM

## 2020-05-11 LAB — HEMOGLOBIN A1C
Hgb A1c MFr Bld: 7.1 % — ABNORMAL HIGH (ref 4.8–5.6)
Mean Plasma Glucose: 157 mg/dL

## 2020-06-16 ENCOUNTER — Ambulatory Visit: Payer: Medicare PPO | Admitting: Family Medicine

## 2020-08-17 ENCOUNTER — Other Ambulatory Visit: Admission: RE | Admit: 2020-08-17 | Payer: Medicare PPO | Source: Ambulatory Visit

## 2020-08-18 ENCOUNTER — Other Ambulatory Visit
Admission: RE | Admit: 2020-08-18 | Discharge: 2020-08-18 | Disposition: A | Discharge status: Home / Self Care | Payer: Medicare PPO | Source: Ambulatory Visit | Attending: Gastroenterology | Admitting: Gastroenterology

## 2020-08-18 ENCOUNTER — Other Ambulatory Visit: Payer: Self-pay

## 2020-08-18 ENCOUNTER — Encounter: Payer: Self-pay | Admitting: *Deleted

## 2020-08-18 DIAGNOSIS — Z20822 Contact with and (suspected) exposure to covid-19: Secondary | ICD-10-CM | POA: Diagnosis not present

## 2020-08-18 DIAGNOSIS — Z01812 Encounter for preprocedural laboratory examination: Secondary | ICD-10-CM | POA: Diagnosis present

## 2020-08-18 LAB — SARS CORONAVIRUS 2 (TAT 6-24 HRS): SARS Coronavirus 2: NEGATIVE

## 2020-08-19 ENCOUNTER — Encounter: Payer: Self-pay | Admitting: *Deleted

## 2020-08-19 ENCOUNTER — Other Ambulatory Visit: Payer: Self-pay

## 2020-08-19 ENCOUNTER — Ambulatory Visit: Payer: Medicare PPO | Admitting: Certified Registered"

## 2020-08-19 ENCOUNTER — Ambulatory Visit
Admission: RE | Admit: 2020-08-19 | Discharge: 2020-08-19 | Disposition: A | Discharge status: Home / Self Care | Payer: Medicare PPO | Attending: Gastroenterology | Admitting: Gastroenterology

## 2020-08-19 ENCOUNTER — Encounter
Admission: RE | Disposition: A | Discharge status: Home / Self Care | Payer: Self-pay | Source: Home / Self Care | Attending: Gastroenterology

## 2020-08-19 DIAGNOSIS — I272 Pulmonary hypertension, unspecified: Secondary | ICD-10-CM | POA: Insufficient documentation

## 2020-08-19 DIAGNOSIS — K640 First degree hemorrhoids: Secondary | ICD-10-CM | POA: Diagnosis not present

## 2020-08-19 DIAGNOSIS — R112 Nausea with vomiting, unspecified: Secondary | ICD-10-CM | POA: Insufficient documentation

## 2020-08-19 DIAGNOSIS — Z79899 Other long term (current) drug therapy: Secondary | ICD-10-CM | POA: Diagnosis not present

## 2020-08-19 DIAGNOSIS — K317 Polyp of stomach and duodenum: Secondary | ICD-10-CM | POA: Diagnosis not present

## 2020-08-19 DIAGNOSIS — K219 Gastro-esophageal reflux disease without esophagitis: Secondary | ICD-10-CM | POA: Diagnosis not present

## 2020-08-19 DIAGNOSIS — Z7901 Long term (current) use of anticoagulants: Secondary | ICD-10-CM | POA: Diagnosis not present

## 2020-08-19 DIAGNOSIS — Z7982 Long term (current) use of aspirin: Secondary | ICD-10-CM | POA: Diagnosis not present

## 2020-08-19 DIAGNOSIS — G4730 Sleep apnea, unspecified: Secondary | ICD-10-CM | POA: Insufficient documentation

## 2020-08-19 DIAGNOSIS — E785 Hyperlipidemia, unspecified: Secondary | ICD-10-CM | POA: Diagnosis not present

## 2020-08-19 DIAGNOSIS — Z791 Long term (current) use of non-steroidal anti-inflammatories (NSAID): Secondary | ICD-10-CM | POA: Diagnosis not present

## 2020-08-19 DIAGNOSIS — G301 Alzheimer's disease with late onset: Secondary | ICD-10-CM | POA: Diagnosis not present

## 2020-08-19 DIAGNOSIS — E039 Hypothyroidism, unspecified: Secondary | ICD-10-CM | POA: Insufficient documentation

## 2020-08-19 DIAGNOSIS — K449 Diaphragmatic hernia without obstruction or gangrene: Secondary | ICD-10-CM | POA: Diagnosis not present

## 2020-08-19 DIAGNOSIS — K319 Disease of stomach and duodenum, unspecified: Secondary | ICD-10-CM | POA: Diagnosis not present

## 2020-08-19 DIAGNOSIS — Z7984 Long term (current) use of oral hypoglycemic drugs: Secondary | ICD-10-CM | POA: Insufficient documentation

## 2020-08-19 DIAGNOSIS — K5730 Diverticulosis of large intestine without perforation or abscess without bleeding: Secondary | ICD-10-CM | POA: Diagnosis not present

## 2020-08-19 DIAGNOSIS — F341 Dysthymic disorder: Secondary | ICD-10-CM | POA: Diagnosis not present

## 2020-08-19 DIAGNOSIS — I471 Supraventricular tachycardia: Secondary | ICD-10-CM | POA: Insufficient documentation

## 2020-08-19 DIAGNOSIS — N3281 Overactive bladder: Secondary | ICD-10-CM | POA: Diagnosis not present

## 2020-08-19 DIAGNOSIS — I671 Cerebral aneurysm, nonruptured: Secondary | ICD-10-CM | POA: Insufficient documentation

## 2020-08-19 DIAGNOSIS — F0281 Dementia in other diseases classified elsewhere with behavioral disturbance: Secondary | ICD-10-CM | POA: Diagnosis not present

## 2020-08-19 DIAGNOSIS — R197 Diarrhea, unspecified: Secondary | ICD-10-CM | POA: Insufficient documentation

## 2020-08-19 DIAGNOSIS — Z85828 Personal history of other malignant neoplasm of skin: Secondary | ICD-10-CM | POA: Insufficient documentation

## 2020-08-19 DIAGNOSIS — E1151 Type 2 diabetes mellitus with diabetic peripheral angiopathy without gangrene: Secondary | ICD-10-CM | POA: Insufficient documentation

## 2020-08-19 DIAGNOSIS — Z79890 Hormone replacement therapy (postmenopausal): Secondary | ICD-10-CM | POA: Insufficient documentation

## 2020-08-19 HISTORY — DX: Cardiac arrhythmia, unspecified: I49.9

## 2020-08-19 HISTORY — DX: Hypothyroidism, unspecified: E03.9

## 2020-08-19 HISTORY — DX: Dementia in other diseases classified elsewhere with behavioral disturbance: F02.81

## 2020-08-19 HISTORY — PX: COLONOSCOPY: SHX5424

## 2020-08-19 HISTORY — DX: Dementia in other diseases classified elsewhere, unspecified severity, with other behavioral disturbance: F02.818

## 2020-08-19 HISTORY — DX: Sleep apnea, unspecified: G47.30

## 2020-08-19 HISTORY — DX: Alzheimer's disease with late onset: G30.1

## 2020-08-19 HISTORY — DX: Peripheral vascular disease, unspecified: I73.9

## 2020-08-19 HISTORY — PX: ESOPHAGOGASTRODUODENOSCOPY: SHX5428

## 2020-08-19 LAB — GLUCOSE, CAPILLARY: Glucose-Capillary: 127 mg/dL — ABNORMAL HIGH (ref 70–99)

## 2020-08-19 SURGERY — EGD (ESOPHAGOGASTRODUODENOSCOPY)
Anesthesia: General

## 2020-08-19 MED ORDER — PROPOFOL 10 MG/ML IV BOLUS: INTRAVENOUS | Status: DC | PRN

## 2020-08-19 MED ORDER — PROPOFOL 10 MG/ML IV BOLUS
INTRAVENOUS | Status: DC | PRN
  Administered 2020-08-19: 40 mg via INTRAVENOUS
  Administered 2020-08-19: 10 mg via INTRAVENOUS

## 2020-08-19 MED ORDER — PROPOFOL 500 MG/50ML IV EMUL
INTRAVENOUS | Status: DC | PRN
  Administered 2020-08-19: 145 ug/kg/min via INTRAVENOUS

## 2020-08-19 MED ORDER — SODIUM CHLORIDE 0.9 % IV SOLN: INTRAVENOUS | Status: DC

## 2020-08-19 MED ORDER — LIDOCAINE HCL (CARDIAC) PF 100 MG/5ML IV SOSY
PREFILLED_SYRINGE | INTRAVENOUS | Status: DC | PRN
  Administered 2020-08-19: 100 mg via INTRAVENOUS

## 2020-08-19 MED ORDER — GLYCOPYRROLATE 0.2 MG/ML IJ SOLN
INTRAMUSCULAR | Status: DC | PRN
  Administered 2020-08-19: .2 mg via INTRAVENOUS

## 2020-08-19 NOTE — Anesthesia Procedure Notes (Signed)
Procedure Name: General with mask airway °Performed by: Fletcher-Harrison, Amyah Clawson, CRNA °Pre-anesthesia Checklist: Patient identified, Emergency Drugs available, Suction available and Patient being monitored °Oxygen Delivery Method: Simple face mask °Induction Type: IV induction °Placement Confirmation: positive ETCO2 and CO2 detector °Dental Injury: Teeth and Oropharynx as per pre-operative assessment  ° ° ° ° ° ° °

## 2020-08-19 NOTE — H&P (Signed)
Outpatient short stay form Pre-procedure 08/19/2020 1:16 PM Alison Miyamoto MD, MPH  Primary Physician: Dr. Sabra Heck  Reason for visit:  Nausea/Vomiting/Diarrhea  History of present illness:   86 y/o lady with intermittent diarrhea and nausea/vomiting here for EGD/Colonoscopy. History of colonoscopy several years ago. Last took blood thinner two days ago. No family history of GI malignancies. History of hysterectomy.   Current Facility-Administered Medications:    0.9 %  sodium chloride infusion, , Intravenous, Continuous, [name], [name], MD  Medications Prior to Admission  Medication Sig Dispense Refill Last Dose   amiodarone (PACERONE) 200 MG tablet Take 200 mg by mouth daily.       amLODipine (NORVASC) 5 MG tablet Take 1 tablet (5 mg total) by mouth daily. 30 tablet 3    aspirin EC 81 MG EC tablet Take 1 tablet (81 mg total) by mouth daily.      atorvastatin (LIPITOR) 10 MG tablet Take 10 mg by mouth daily.      celecoxib (CELEBREX) 200 MG capsule Take 200 mg by mouth daily.      donepezil (ARICEPT) 10 MG tablet Take 10 mg by mouth at bedtime.      ELIQUIS 5 MG TABS tablet Take 5 mg by mouth 2 (two) times daily.       furosemide (LASIX) 20 MG tablet Take 1 tablet (20 mg total) by mouth daily as needed for edema. 30 tablet     gabapentin (NEURONTIN) 100 MG capsule SMARTSIG:1 Capsule(s) By Mouth Every Evening      glipiZIDE (GLUCOTROL) 5 MG tablet Take 5 mg by mouth every morning.      hydrochlorothiazide (HYDRODIURIL) 12.5 MG tablet Take 12.5 mg by mouth daily.      levothyroxine (SYNTHROID) 88 MCG tablet Take 88 mcg by mouth daily.       metFORMIN (GLUCOPHAGE) 1000 MG tablet Take 1,000 mg by mouth daily with breakfast.       metoprolol succinate (TOPROL-XL) 25 MG 24 hr tablet Take 1 tablet (25 mg total) by mouth 2 (two) times daily. 60 tablet 3    omeprazole (PRILOSEC) 40 MG capsule Take 40 mg by mouth in the morning and at bedtime.       oxybutynin (DITROPAN) 5  MG tablet Take 5 mg by mouth daily.       potassium chloride (K-DUR) 10 MEQ tablet Take 10 mEq by mouth 2 (two) times daily.       venlafaxine XR (EFFEXOR-XR) 75 MG 24 hr capsule Take 75 mg by mouth daily with breakfast.         No Known Allergies   Past Medical History:  Diagnosis Date   Arrhythmia    Atrial flutter ([place]) 01/2018   new onset    Basal cell carcinoma of back    Basal cell carcinoma of lip    Cerebral aneurysm    followed by [facility]   DDD (degenerative disc disease), lumbar    superior plate depression, L3 08/18/2014   Diabetes mellitus type II, controlled ([place])    Diverticulosis    Dysrhythmia    Paroxysmal Supraventricular Tachycardia   Dysthymia    depression   GERD (gastroesophageal reflux disease)    History of meniscal tear    Hyperlipidemia    Hypertension    Hypothyroidism    Late onset Alzheimer's disease with behavioral disturbance ([place])    Mild pulmonary hypertension ([place])    Overactive bladder    Peripheral vascular disease ([facility])    Seasonal allergic  rhinitis    Sleep apnea     Review of systems:  Otherwise negative.    Physical Exam  Gen: Alert, oriented. Appears stated age.  HEENT: [place]/AT. PERRLA. Lungs: No respiratory distress Abd: soft, benign, no masses. BS+ Ext: No edema. Pulses 2+    Planned procedures: Proceed with EGD/colonoscopy. The patient understands the nature of the planned procedure, indications, risks, alternatives and potential complications including but not limited to bleeding, infection, perforation, damage to internal organs and possible oversedation/side effects from anesthesia. The patient agrees and gives consent to proceed.  Please refer to procedure notes for findings, recommendations and patient disposition/instructions.     Alison Miyamoto MD, MPH Gastroenterology 08/19/2020  1:16 PM

## 2020-08-19 NOTE — Interval H&P Note (Signed)
History and Physical Interval Note:  08/19/2020 1:33 PM  Alison Richardson  has presented today for surgery, with the diagnosis of BELCHING, DIARRHEA.  The various methods of treatment have been discussed with the patient and family. After consideration of risks, benefits and other options for treatment, the patient has consented to  Procedure(s): ESOPHAGOGASTRODUODENOSCOPY (EGD) (N/A) COLONOSCOPY (N/A) as a surgical intervention.  The patient's history has been reviewed, patient examined, no change in status, stable for surgery.  I have reviewed the patient's chart and labs.  Questions were answered to the patient's satisfaction.     Lesly Rubenstein  Ok to proceed with EGD/Colonoscopy

## 2020-08-19 NOTE — Op Note (Addendum)
[facility] Patient Name: Alison Richardson Procedure Date: 08/19/2020 1:57 PM MRN: [id number] Account #: [id number] Date of Birth: 07/28/1933 Admit Type: Outpatient Age: 86 Room: [facility] ENDO ROOM 3 Gender: Female Note Status: Finalized Procedure:             Upper GI endoscopy Indications:           Nausea with vomiting Providers:             Andrey Farmer MD, MD Referring MD:          Rusty Aus, MD (Referring MD) Medicines:             Monitored Anesthesia Care Complications:         No immediate complications. Estimated blood loss:                         Minimal. Procedure:             Pre-Anesthesia Assessment:                        - Prior to the procedure, a History and Physical was                         performed, and patient medications and allergies were                         reviewed. The patient is competent. The risks and                         benefits of the procedure and the sedation options and                         risks were discussed with the patient. All questions                         were answered and informed consent was obtained.                         Patient identification and proposed procedure were                         verified by the physician, the nurse, the anesthetist                         and the technician in the endoscopy suite. Mental                         Status Examination: alert and oriented. Airway                         Examination: normal oropharyngeal airway and neck                         mobility. Respiratory Examination: clear to                         auscultation. CV Examination: normal. Prophylactic  Antibiotics: The patient does not require prophylactic                         antibiotics. Prior Anticoagulants: The patient has                         taken Eliquis (apixaban), last dose was 2 days prior                         to procedure. ASA Grade  Assessment: II - A patient                         with mild systemic disease. After reviewing the risks                         and benefits, the patient was deemed in satisfactory                         condition to undergo the procedure. The anesthesia                         plan was to use monitored anesthesia care (MAC).                         Immediately prior to administration of medications,                         the patient was re-assessed for adequacy to receive                         sedatives. The heart rate, respiratory rate, oxygen                         saturations, blood pressure, adequacy of pulmonary                         ventilation, and response to care were monitored                         throughout the procedure. The physical status of the                         patient was re-assessed after the procedure.                        After obtaining informed consent, the endoscope was                         passed under direct vision. Throughout the procedure,                         the patient's blood pressure, pulse, and oxygen                         saturations were monitored continuously. The Endoscope                         was introduced through the mouth, and advanced  to the                         second part of duodenum. The upper GI endoscopy was                         accomplished without difficulty. The patient tolerated                         the procedure well. Findings:      A small hiatal hernia was present.      Localized mild inflammation characterized by erythema was found in the       gastric antrum. Biopsies were taken with a cold forceps for Helicobacter       pylori testing. Estimated blood loss was minimal.      A single 1 mm sessile polyp with no stigmata of recent bleeding was       found in the gastric body. The polyp was removed with a cold biopsy       forceps. Resection and retrieval were complete. Estimated blood loss was        minimal.      The examined duodenum was normal. Impression:            - Small hiatal hernia.                        - Gastritis. Biopsied.                        - A single gastric polyp. Resected and retrieved.                        - Normal examined duodenum. Recommendation:        - Discharge patient to home.                        - Resume previous diet.                        - Continue present medications.                        - Await pathology results.                        - Return to referring physician as previously                         scheduled.                        - Resume anticoagulant medication at prior dose today. Procedure Code(s):     --- Professional ---                        [id number], Esophagogastroduodenoscopy, flexible,                         transoral; with biopsy, single or multiple Diagnosis Code(s):     --- Professional ---                        K44.9, Diaphragmatic hernia  without obstruction or                         gangrene                        K29.70, Gastritis, unspecified, without bleeding                        K31.7, Polyp of stomach and duodenum                        R11.2, Nausea with vomiting, unspecified CPT copyright 2019 American Medical Association. All rights reserved. The codes documented in this report are preliminary and upon coder review may  be revised to meet current compliance requirements. Andrey Farmer, MD Andrey Farmer MD, MD 08/19/2020 2:41:34 PM Number of Addenda: 0 Note Initiated On: 08/19/2020 1:57 PM Estimated Blood Loss:  Estimated blood loss was minimal.      [facility]

## 2020-08-19 NOTE — Anesthesia Preprocedure Evaluation (Signed)
Anesthesia Evaluation  Patient identified by MRN, date of birth, ID band Patient awake    Reviewed: Allergy & Precautions, NPO status , Patient's Chart, lab work & pertinent test results  History of Anesthesia Complications Negative for: history of anesthetic complications  Airway Mallampati: II  TM Distance: >3 FB Neck ROM: Full    Dental no notable dental hx. (+) Teeth Intact   Pulmonary neg pulmonary ROS, neg sleep apnea, neg COPD, Patient abstained from smoking.Not current smoker,    Pulmonary exam normal breath sounds clear to auscultation       Cardiovascular Exercise Tolerance: Good METShypertension, + Peripheral Vascular Disease  (-) CAD and (-) Past MI + dysrhythmias Atrial Fibrillation  Rhythm:Irregular Rate:Normal - Systolic murmurs    Neuro/Psych PSYCHIATRIC DISORDERS Anxiety Depression  Neuromuscular disease negative neurological ROS     GI/Hepatic GERD  ,(+)     (-) substance abuse  ,   Endo/Other  diabetesHypothyroidism   Renal/GU negative Renal ROS     Musculoskeletal   Abdominal   Peds  Hematology   Anesthesia Other Findings Past Medical History: No date: Arrhythmia 01/2018: Atrial flutter (HCC)     Comment:  new onset  No date: Basal cell carcinoma of back No date: Basal cell carcinoma of lip No date: Cerebral aneurysm     Comment:  followed by [facility] No date: DDD (degenerative disc disease), lumbar     Comment:  superior plate depression, L3 08/18/2014 No date: Diabetes mellitus type II, controlled ([place]) No date: Diverticulosis No date: Dysrhythmia     Comment:  Paroxysmal Supraventricular Tachycardia No date: Dysthymia     Comment:  depression No date: GERD (gastroesophageal reflux disease) No date: History of meniscal tear No date: Hyperlipidemia No date: Hypertension No date: Hypothyroidism No date: Late onset Alzheimer's disease with behavioral disturbance  (HCC) No date: Mild  pulmonary hypertension (HCC) No date: Overactive bladder No date: Peripheral vascular disease (HCC) No date: Seasonal allergic rhinitis No date: Sleep apnea  Reproductive/Obstetrics                             Anesthesia Physical Anesthesia Plan  ASA: III  Anesthesia Plan: General   Post-op Pain Management:    Induction: Intravenous  PONV Risk Score and Plan: 3 and Ondansetron, Propofol infusion and TIVA  Airway Management Planned: Nasal Cannula  Additional Equipment: None  Intra-op Plan:   Post-operative Plan:   Informed Consent: I have reviewed the patients History and Physical, chart, labs and discussed the procedure including the risks, benefits and alternatives for the proposed anesthesia with the patient or authorized representative who has indicated his/her understanding and acceptance.     Dental advisory given  Plan Discussed with: CRNA and Surgeon  Anesthesia Plan Comments: (Discussed risks of anesthesia with patient, including possibility of difficulty with spontaneous ventilation under anesthesia necessitating airway intervention, PONV, and rare risks such as cardiac or respiratory or neurological events. Patient understands.)        Anesthesia Quick Evaluation

## 2020-08-19 NOTE — Transfer of Care (Addendum)
Immediate Anesthesia Transfer of Care Note  Patient: Tiffony Kite  Procedure(s) Performed: ESOPHAGOGASTRODUODENOSCOPY (EGD) (N/A ) COLONOSCOPY (N/A )  Patient Location: Endoscopy Unit  Anesthesia Type:General  Level of Consciousness: drowsy and responds to stimulation  Airway & Oxygen Therapy: Patient Spontanous Breathing and Patient connected to face mask oxygen  Post-op Assessment: Report given to RN and Post -op Vital signs reviewed and stable  Post vital signs: Reviewed and stable  Last Vitals:  Vitals Value Taken Time  BP 120/68 08/19/20 1440  Temp    Pulse 73 08/19/20 1441  Resp 14 08/19/20 1441  SpO2 100 % 08/19/20 1441  Vitals shown include unvalidated device data.  Last Pain:  Vitals:   08/19/20 1328  TempSrc: Temporal  PainSc: 0-No pain         Complications: No complications documented.

## 2020-08-19 NOTE — Op Note (Addendum)
[facility] Patient Name: Alison Richardson Procedure Date: 08/19/2020 1:57 PM MRN: [id number] Account #: [id number] Date of Birth: 07-22-1933 Admit Type: Outpatient Age: 86 Room: [facility] ENDO ROOM 3 Gender: Female Note Status: Finalized Procedure:             Colonoscopy Indications:           Clinically significant diarrhea of unexplained origin Providers:             Andrey Farmer MD, MD Referring MD:          Rusty Aus, MD (Referring MD) Medicines:             Monitored Anesthesia Care Complications:         No immediate complications. Estimated blood loss:                         Minimal. Procedure:             Pre-Anesthesia Assessment:                        - Prior to the procedure, a History and Physical was                         performed, and patient medications and allergies were                         reviewed. The patient is competent. The risks and                         benefits of the procedure and the sedation options and                         risks were discussed with the patient. All questions                         were answered and informed consent was obtained.                         Patient identification and proposed procedure were                         verified by the physician, the nurse, the anesthetist                         and the technician in the endoscopy suite. Mental                         Status Examination: alert and oriented. Airway                         Examination: normal oropharyngeal airway and neck                         mobility. Respiratory Examination: clear to                         auscultation. CV Examination: normal. Prophylactic  Antibiotics: The patient does not require prophylactic                         antibiotics. Prior Anticoagulants: The patient has                         taken Eliquis (apixaban), last dose was 2 days prior                          to procedure. ASA Grade Assessment: II - A patient                         with mild systemic disease. After reviewing the risks                         and benefits, the patient was deemed in satisfactory                         condition to undergo the procedure. The anesthesia                         plan was to use monitored anesthesia care (MAC).                         Immediately prior to administration of medications,                         the patient was re-assessed for adequacy to receive                         sedatives. The heart rate, respiratory rate, oxygen                         saturations, blood pressure, adequacy of pulmonary                         ventilation, and response to care were monitored                         throughout the procedure. The physical status of the                         patient was re-assessed after the procedure.                        After obtaining informed consent, the colonoscope was                         passed under direct vision. Throughout the procedure,                         the patient's blood pressure, pulse, and oxygen                         saturations were monitored continuously. The                         Colonoscope was introduced through the anus and  advanced to the the terminal ileum. The colonoscopy                         was performed without difficulty. The patient                         tolerated the procedure well. The quality of the bowel                         preparation was good. Findings:      The perianal and digital rectal examinations were normal.      The terminal ileum appeared normal.      A few small-mouthed diverticula were found in the sigmoid colon.      The colon (entire examined portion) appeared normal. Biopsies for       histology were taken with a cold forceps from the entire colon for       evaluation of microscopic colitis. Estimated blood loss was minimal.       Non-bleeding internal hemorrhoids were found during retroflexion. The       hemorrhoids were Grade I (internal hemorrhoids that do not prolapse). Impression:            - The examined portion of the ileum was normal.                        - Diverticulosis in the sigmoid colon.                        - The entire examined colon is normal. Biopsied.                        - Non-bleeding internal hemorrhoids. Recommendation:        - Repeat colonoscopy is not recommended due to current                         age (16 years or older) for surveillance.                        - Return to referring physician as previously                         scheduled.                        - Await pathology results.                        - Resume previous diet.                        - Resume anticoagulant medication at prior dose today. Procedure Code(s):     --- Professional ---                        [phone], Colonoscopy, flexible; with biopsy, single or                         multiple Diagnosis Code(s):     --- Professional ---  K64.0, First degree hemorrhoids                        R19.7, Diarrhea, unspecified                        K57.30, Diverticulosis of large intestine without                         perforation or abscess without bleeding CPT copyright 2019 American Medical Association. All rights reserved. The codes documented in this report are preliminary and upon coder review may  be revised to meet current compliance requirements. Andrey Farmer, MD Andrey Farmer MD, MD 08/19/2020 2:46:02 PM Number of Addenda: 0 Note Initiated On: 08/19/2020 1:57 PM Scope Withdrawal Time: 0 hours 12 minutes 27 seconds  Total Procedure Duration: 0 hours 22 minutes 11 seconds  Estimated Blood Loss:  Estimated blood loss was minimal.      [facility]

## 2020-08-20 NOTE — Anesthesia Postprocedure Evaluation (Signed)
Anesthesia Post Note  Patient: Alison Richardson  Procedure(s) Performed: ESOPHAGOGASTRODUODENOSCOPY (EGD) (N/A ) COLONOSCOPY (N/A )  Patient location during evaluation: Endoscopy Anesthesia Type: General Level of consciousness: awake and alert Pain management: pain level controlled Vital Signs Assessment: post-procedure vital signs reviewed and stable Respiratory status: spontaneous breathing, nonlabored ventilation, respiratory function stable and patient connected to nasal cannula oxygen Cardiovascular status: blood pressure returned to baseline and stable Postop Assessment: no apparent nausea or vomiting Anesthetic complications: no   No complications documented.   Last Vitals:  Vitals:   08/19/20 1500 08/19/20 1510  BP: (!) 142/94 (!) 148/79  Pulse: 63 62  Resp: 17 15  Temp:    SpO2: 100% 99%    Last Pain:  Vitals:   08/19/20 1510  TempSrc:   PainSc: 0-No pain                 Arita Miss

## 2020-08-22 ENCOUNTER — Encounter: Payer: Self-pay | Admitting: Gastroenterology

## 2020-08-23 LAB — SURGICAL PATHOLOGY

## 2020-10-06 ENCOUNTER — Other Ambulatory Visit: Payer: Self-pay

## 2020-10-06 ENCOUNTER — Encounter: Payer: Self-pay | Admitting: Emergency Medicine

## 2020-10-06 ENCOUNTER — Observation Stay
Admission: EM | Admit: 2020-10-06 | Discharge: 2020-10-07 | Disposition: A | Discharge status: Home / Self Care | Payer: Medicare PPO | Attending: Hospitalist | Admitting: Hospitalist

## 2020-10-06 ENCOUNTER — Emergency Department: Payer: Medicare PPO

## 2020-10-06 DIAGNOSIS — I509 Heart failure, unspecified: Secondary | ICD-10-CM

## 2020-10-06 DIAGNOSIS — Z20822 Contact with and (suspected) exposure to covid-19: Secondary | ICD-10-CM | POA: Insufficient documentation

## 2020-10-06 DIAGNOSIS — E1140 Type 2 diabetes mellitus with diabetic neuropathy, unspecified: Secondary | ICD-10-CM | POA: Insufficient documentation

## 2020-10-06 DIAGNOSIS — Z85828 Personal history of other malignant neoplasm of skin: Secondary | ICD-10-CM | POA: Diagnosis not present

## 2020-10-06 DIAGNOSIS — R0602 Shortness of breath: Secondary | ICD-10-CM

## 2020-10-06 DIAGNOSIS — E1169 Type 2 diabetes mellitus with other specified complication: Secondary | ICD-10-CM | POA: Diagnosis not present

## 2020-10-06 DIAGNOSIS — Z7901 Long term (current) use of anticoagulants: Secondary | ICD-10-CM | POA: Insufficient documentation

## 2020-10-06 DIAGNOSIS — D72829 Elevated white blood cell count, unspecified: Secondary | ICD-10-CM

## 2020-10-06 DIAGNOSIS — I110 Hypertensive heart disease with heart failure: Principal | ICD-10-CM | POA: Insufficient documentation

## 2020-10-06 DIAGNOSIS — E039 Hypothyroidism, unspecified: Secondary | ICD-10-CM | POA: Diagnosis present

## 2020-10-06 DIAGNOSIS — I739 Peripheral vascular disease, unspecified: Secondary | ICD-10-CM | POA: Diagnosis present

## 2020-10-06 DIAGNOSIS — Z7982 Long term (current) use of aspirin: Secondary | ICD-10-CM | POA: Diagnosis not present

## 2020-10-06 DIAGNOSIS — E782 Mixed hyperlipidemia: Secondary | ICD-10-CM | POA: Diagnosis present

## 2020-10-06 DIAGNOSIS — I152 Hypertension secondary to endocrine disorders: Secondary | ICD-10-CM | POA: Diagnosis present

## 2020-10-06 DIAGNOSIS — I480 Paroxysmal atrial fibrillation: Secondary | ICD-10-CM | POA: Diagnosis present

## 2020-10-06 DIAGNOSIS — E1151 Type 2 diabetes mellitus with diabetic peripheral angiopathy without gangrene: Secondary | ICD-10-CM | POA: Diagnosis not present

## 2020-10-06 DIAGNOSIS — I10 Essential (primary) hypertension: Secondary | ICD-10-CM | POA: Diagnosis present

## 2020-10-06 DIAGNOSIS — R778 Other specified abnormalities of plasma proteins: Secondary | ICD-10-CM

## 2020-10-06 DIAGNOSIS — Z79899 Other long term (current) drug therapy: Secondary | ICD-10-CM | POA: Diagnosis not present

## 2020-10-06 DIAGNOSIS — Z7984 Long term (current) use of oral hypoglycemic drugs: Secondary | ICD-10-CM | POA: Insufficient documentation

## 2020-10-06 DIAGNOSIS — E1159 Type 2 diabetes mellitus with other circulatory complications: Secondary | ICD-10-CM | POA: Diagnosis present

## 2020-10-06 DIAGNOSIS — R7989 Other specified abnormal findings of blood chemistry: Secondary | ICD-10-CM

## 2020-10-06 DIAGNOSIS — J810 Acute pulmonary edema: Secondary | ICD-10-CM

## 2020-10-06 LAB — BASIC METABOLIC PANEL
Anion gap: 14 (ref 5–15)
BUN: 9 mg/dL (ref 8–23)
CO2: 24 mmol/L (ref 22–32)
Calcium: 9 mg/dL (ref 8.9–10.3)
Chloride: 102 mmol/L (ref 98–111)
Creatinine, Ser: 0.7 mg/dL (ref 0.44–1.00)
GFR, Estimated: >60 mL/min (ref >60)
Glucose, Bld: 111 mg/dL — ABNORMAL HIGH (ref 70–99)
Potassium: 3.4 mmol/L — ABNORMAL LOW (ref 3.5–5.1)
Sodium: 140 mmol/L (ref 135–145)

## 2020-10-06 LAB — CBC
HCT: 40.2 % (ref 36.0–46.0)
Hemoglobin: 13.6 g/dL (ref 12.0–15.0)
MCH: 30.4 pg (ref 26.0–34.0)
MCHC: 33.8 g/dL (ref 30.0–36.0)
MCV: 89.9 fL (ref 80.0–100.0)
Platelets: 269 10*3/uL (ref 150–400)
RBC: 4.47 MIL/uL (ref 3.87–5.11)
RDW: 13.1 % (ref 11.5–15.5)
WBC: 14.7 10*3/uL — ABNORMAL HIGH (ref 4.0–10.5)
nRBC: 0 % (ref 0.0–0.2)

## 2020-10-06 LAB — RESPIRATORY PANEL BY RT PCR (FLU A&B, COVID)
Influenza A by PCR: NEGATIVE
Influenza B by PCR: NEGATIVE
SARS Coronavirus 2 by RT PCR: NEGATIVE

## 2020-10-06 LAB — TROPONIN I (HIGH SENSITIVITY)
Troponin I (High Sensitivity): 11 ng/L (ref <18)
Troponin I (High Sensitivity): 19 ng/L — ABNORMAL HIGH (ref <18)

## 2020-10-06 LAB — PROCALCITONIN: Procalcitonin: <0.1 ng/mL

## 2020-10-06 LAB — BRAIN NATRIURETIC PEPTIDE: B Natriuretic Peptide: 179 pg/mL — ABNORMAL HIGH (ref 0.0–100.0)

## 2020-10-06 MED ORDER — ACETAMINOPHEN 500 MG PO TABS
1000.0000 mg | ORAL_TABLET | Freq: Once | ORAL | Status: AC
  Administered 2020-10-07: 1000 mg via ORAL
  Filled 2020-10-06: qty 2

## 2020-10-06 MED ORDER — FUROSEMIDE 10 MG/ML IJ SOLN
40.0000 mg | Freq: Once | INTRAMUSCULAR | Status: AC
  Administered 2020-10-07: 40 mg via INTRAVENOUS
  Filled 2020-10-06: qty 4

## 2020-10-06 NOTE — ED Triage Notes (Signed)
Pt arrived via EMS from home. Pt c/o "breathing problem" and "SOB"  Pt states SOB started around 2pm today.   Pt denies vomiting. Pt states diarrhea and nausea.

## 2020-10-06 NOTE — ED Notes (Signed)
Pt ambulated around room with this tech while using front wheel walker as assistive device. VS prior to ambulation: SpO2 of 98% RA, Respirations of 17 breaths per minute, and Pulse Rate of 87 bpm. VS during ambulation: SpO2 of 96% RA, Respirations of 25 breaths per minute, and Pulse Rate of 98 bpm.

## 2020-10-06 NOTE — ED Triage Notes (Signed)
Pt comes via EMS from home with c/o diarrhea and weakness. Pt states 4 episodes today.

## 2020-10-06 NOTE — ED Provider Notes (Signed)
[facility] Emergency Department Provider Note   ____________________________________________   I have reviewed the triage vital signs and the nursing notes.   HISTORY  Chief Complaint Shortness of Breath   History limited by: Not Limited   HPI Alison Richardson is a 86 y.o. female who presents to the emergency department today because of concerns for episode of shortness of breath.  Patient states that she is started having the shortness of breath this morning.  She noticed it when she went to lie down.  She denies any chest pain associated with this shortness of breath.  She has noticed a slight cough that has been present over the past couple of days.  Initially she felt like she had a slight wheeze today.  The patient did recently see her primary care doctor for some nausea vomiting and diarrhea.  Tested negative for Covid at that time.  Patient denies any history of lung disease.  She has noticed some slight leg swelling so has been taking diuretics.  Records reviewed. Per medical record review patient has a history of HLD, HTN.   Past Medical History:  Diagnosis Date  . Arrhythmia   . Atrial flutter ([place]) 01/2018   new onset   . Basal cell carcinoma of back   . Basal cell carcinoma of lip   . Cerebral aneurysm    followed by [facility]  . DDD (degenerative disc disease), lumbar    superior plate depression, L3 08/18/2014  . Diabetes mellitus type II, controlled ([place])   . Diverticulosis   . Dysrhythmia    Paroxysmal Supraventricular Tachycardia  . Dysthymia    depression  . GERD (gastroesophageal reflux disease)   . History of meniscal tear   . Hyperlipidemia   . Hypertension   . Hypothyroidism   . Late onset Alzheimer's disease with behavioral disturbance ([place])   . Mild pulmonary hypertension ([place])   . Overactive bladder   . Peripheral vascular disease ([place])   . Seasonal allergic rhinitis   . Sleep apnea     Patient Active Problem List    Diagnosis Date Noted  . DOE (dyspnea on exertion) 05/09/2020  . Syncope and collapse 09/30/2019  . Syncope 09/29/2019  . Diabetes mellitus type 2 in nonobese ([place]) 01/21/2019  . Dyslipidemia 01/21/2019  . PSVT (paroxysmal supraventricular tachycardia) ([place]) 01/21/2019  . Tenosynovitis of foot 01/21/2019  . PAD (peripheral artery disease) ([place]) 01/06/2019  . Preop testing 01/06/2019  . DM type 2 with diabetic mixed hyperlipidemia ([place]) 09/08/2018  . Peripheral neuropathy, idiopathic 09/08/2018  . Anxiety disorder 06/19/2018  . Depression, major, single episode, mild ([place]) 06/19/2018  . Paroxysmal atrial fibrillation ([place]) 06/19/2018  . Atrial flutter ([place]) 06/19/2018  . Pure hypercholesterolemia 06/19/2018  . Vitamin B12 deficiency 06/19/2018  . Insomnia 06/19/2018  . Protein-calorie malnutrition ([place]) 06/19/2018  . Type 2 diabetes mellitus with diabetic neuropathy ([place]) 06/19/2018  . Knee pain 06/19/2018  . Cognitive impairment, mild, so stated 06/19/2018  . Osteoarthritis of right knee 06/19/2018  . Benign essential hypertension 06/19/2018  . Hypothyroidism 06/19/2018  . Overactive bladder 06/19/2018  . Generalized muscle weakness 06/19/2018  . Difficulty in walking, not elsewhere classified 06/19/2018  . Major depression in remission ([place]) 06/18/2018  . Primary osteoarthritis involving multiple joints 06/18/2018  . Mixed hyperlipidemia 11/01/2015  . Cerebral aneurysm 04/29/2013    Past Surgical History:  Procedure Laterality Date  . APPENDECTOMY  1946  . BASAL CELL CARCINOMA EXCISION     2006 and 2009  removed from back and lip  . CARDIOVASCULAR STRESS TEST  2015   nuclear cardiac stress test negative for ischemia - Dr. Satira Sark  . CATARACT EXTRACTION, BILATERAL  2006  . COLONOSCOPY  2014  . COLONOSCOPY N/A 08/19/2020   Procedure: COLONOSCOPY;  Surgeon: Lesly Rubenstein, MD;  Location: [facility] ENDOSCOPY;  Service: Endoscopy;  Laterality: N/A;  . [place]  . ESOPHAGOGASTRODUODENOSCOPY N/A 08/19/2020   Procedure: ESOPHAGOGASTRODUODENOSCOPY (EGD);  Surgeon: Lesly Rubenstein, MD;  Location: [facility] ENDOSCOPY;  Service: Endoscopy;  Laterality: N/A;  . INJECTION KNEE Left 05/2015  . REPLACEMENT TOTAL KNEE Left 09/06/2016   Dr. Barnet Pall  . TONSILLECTOMY AND ADENOIDECTOMY  1953  . TOTAL ABDOMINAL HYSTERECTOMY  1986   [facility]    Prior to Admission medications   Medication Sig Start Date End Date Taking? Authorizing Provider  amiodarone (PACERONE) 200 MG tablet Take 200 mg by mouth daily.  01/02/19   [provider]  amLODipine (NORVASC) 5 MG tablet Take 1 tablet (5 mg total) by mouth daily. 05/10/20   Danford, Suann Larry, MD  aspirin EC 81 MG EC tablet Take 1 tablet (81 mg total) by mouth daily. Patient not taking: Reported on 08/19/2020 05/11/20   Edwin Dada, MD  atorvastatin (LIPITOR) 10 MG tablet Take 10 mg by mouth daily.    [provider]  celecoxib (CELEBREX) 200 MG capsule Take 200 mg by mouth daily. Patient not taking: Reported on 08/19/2020 04/27/20   [provider]  donepezil (ARICEPT) 10 MG tablet Take 10 mg by mouth at bedtime.    [provider]  ELIQUIS 5 MG TABS tablet Take 5 mg by mouth 2 (two) times daily.  05/06/20   [provider]  furosemide (LASIX) 20 MG tablet Take 1 tablet (20 mg total) by mouth daily as needed for edema. 05/10/20   Danford, Suann Larry, MD  gabapentin (NEURONTIN) 100 MG capsule SMARTSIG:1 Capsule(s) By Mouth Every Evening 05/06/20   [provider]  glipiZIDE (GLUCOTROL) 5 MG tablet Take 5 mg by mouth every morning. 05/04/20   [provider]  hydrochlorothiazide (HYDRODIURIL) 12.5 MG tablet Take 12.5 mg by mouth daily.    [provider]  levothyroxine (SYNTHROID) 88 MCG tablet Take 88 mcg by mouth daily.  09/21/19   [provider]  metFORMIN (GLUCOPHAGE) 1000 MG tablet Take 1,000 mg by mouth daily with breakfast.   03/18/20   [provider]  metoprolol succinate (TOPROL-XL) 25 MG 24 hr tablet Take 1 tablet (25 mg total) by mouth 2 (two) times daily. 05/10/20   Danford, Suann Larry, MD  omeprazole (PRILOSEC) 40 MG capsule Take 40 mg by mouth in the morning and at bedtime.  Patient not taking: Reported on 08/19/2020    [provider]  oxybutynin (DITROPAN) 5 MG tablet Take 5 mg by mouth daily.  Patient not taking: Reported on 08/19/2020    [provider]  potassium chloride (K-DUR) 10 MEQ tablet Take 10 mEq by mouth 2 (two) times daily.     [provider]  venlafaxine XR (EFFEXOR-XR) 75 MG 24 hr capsule Take 75 mg by mouth daily with breakfast.     [provider]    Allergies Patient has no known allergies.  Family History  Problem Relation Age of Onset  . Hypertension Mother   . CAD Father     Social History Social History   Tobacco Use  . Smoking status: Never Smoker  .  Smokeless tobacco: Never Used  Vaping Use  . Vaping Use: Never used  Substance Use Topics  . Alcohol use: Yes    Comment: 3 x week   . Drug use: Never    Review of Systems Constitutional: No fever/chills Eyes: No visual changes. ENT: No sore throat. Cardiovascular: Denies chest pain. Respiratory: Positive for shortness of breath. Gastrointestinal: No abdominal pain.  Genitourinary: Negative for dysuria. Musculoskeletal: Positive for leg swelling.  Skin: Negative for rash. Neurological: Negative for headaches, focal weakness or numbness.  ____________________________________________   PHYSICAL EXAM:  VITAL SIGNS: ED Triage Vitals  Enc Vitals Group     BP 10/06/20 1622 (!) 158/78     Pulse Rate 10/06/20 1622 80     Resp 10/06/20 1622 17     Temp 10/06/20 1622 98.9 F (37.2 C)     Temp Source 10/06/20 1622 Oral     SpO2 10/06/20 1626 95 %     Weight 10/06/20 1623 165 lb (74.8 kg)     Height 10/06/20 1623 5\' 6"  (1.676 m)     Head Circumference --      Peak  Flow --      Pain Score 10/06/20 1623 0   Constitutional: Alert and oriented.  Eyes: Conjunctivae are normal.  ENT      Head: Normocephalic and atraumatic.      Nose: No congestion/rhinnorhea.      Mouth/Throat: Mucous membranes are moist.      Neck: No stridor. Hematological/Lymphatic/Immunilogical: No cervical lymphadenopathy. Cardiovascular: Normal rate, regular rhythm.  No murmurs, rubs, or gallops.  Respiratory: Normal respiratory effort without tachypnea nor retractions. Breath sounds are clear and equal bilaterally. No wheezes/rales/rhonchi. Gastrointestinal: Soft and non tender. No rebound. No guarding.  Genitourinary: Deferred Musculoskeletal: Normal range of motion in all extremities. Trace bilateral edema.  Neurologic:  Normal speech and language. No gross focal neurologic deficits are appreciated.  Skin:  Skin is warm, dry and intact. No rash noted. Psychiatric: Mood and affect are normal. Speech and behavior are normal. Patient exhibits appropriate insight and judgment.  ____________________________________________    LABS (pertinent positives/negatives)  BMP wnl except k 3.4, glu 111 CBC wbc 14.7, hgb 13.6, plt 269 Procalcitonin <0.10 BNP 179.0 Trop hs 11 to 19 ____________________________________________   EKG  I, Nance Pear, attending physician, personally viewed and interpreted this EKG  EKG Time: 1625 Rate: 80 Rhythm: normal sinus rhythm Axis: left axis deviation Intervals: qtc 495 QRS: LAFB ST changes: no st elevation Impression: abnormal ekg  ____________________________________________    RADIOLOGY  CXR New streaky interstitial opacities in bases of lungs  ____________________________________________   PROCEDURES  Procedures  ____________________________________________   INITIAL IMPRESSION / ASSESSMENT AND PLAN / ED COURSE  Pertinent labs & imaging results that were available during my care of the patient were reviewed by me  and considered in my medical decision making (see chart for details).   Patient presented to the emergency department today because of concerns for shortness of breath.  On exam patient without any wheezing, crackles or rhonchi appreciated.  Did have some lower extremity edema. CXR was concerning for streaky opacities in bilateral bases of her lungs. Procalcitonin was less than 0.10. Did have concern for possible pulmonary edema given that it was worse with lying flat. BNP slightly elevated. Will check COVID.   ____________________________________________   FINAL CLINICAL IMPRESSION(S) / ED DIAGNOSES  Final diagnoses:  Shortness of breath     Note: This dictation was prepared with Dragon dictation. Any  transcriptional errors that result from this process are unintentional     Nance Pear, MD 10/06/20 [phone]

## 2020-10-06 NOTE — ED Notes (Addendum)
Pt urinated on self, this writer into assist pt. Pts bed linen changed; clean chucks applied to bed, pt changed into clean gown and clean brief. Warm blankets provided; pt requesting tylenol [place], RN notified. No other needs voiced at this time.

## 2020-10-07 ENCOUNTER — Inpatient Hospital Stay
Admit: 2020-10-07 | Discharge: 2020-10-07 | Disposition: A | Discharge status: Home / Self Care | Payer: Medicare PPO | Attending: Internal Medicine | Admitting: Internal Medicine

## 2020-10-07 DIAGNOSIS — I509 Heart failure, unspecified: Secondary | ICD-10-CM

## 2020-10-07 DIAGNOSIS — D72829 Elevated white blood cell count, unspecified: Secondary | ICD-10-CM

## 2020-10-07 DIAGNOSIS — R778 Other specified abnormalities of plasma proteins: Secondary | ICD-10-CM

## 2020-10-07 HISTORY — DX: Elevated white blood cell count, unspecified: D72.829

## 2020-10-07 HISTORY — DX: Heart failure, unspecified: I50.9

## 2020-10-07 LAB — TROPONIN I (HIGH SENSITIVITY): Troponin I (High Sensitivity): 30 ng/L — ABNORMAL HIGH (ref <18)

## 2020-10-07 LAB — ECHOCARDIOGRAM COMPLETE
Height: 66 in
S' Lateral: 2.54 cm
Weight: 2640 oz

## 2020-10-07 LAB — GLUCOSE, CAPILLARY
Glucose-Capillary: 230 mg/dL — ABNORMAL HIGH (ref 70–99)
Glucose-Capillary: 137 mg/dL — ABNORMAL HIGH (ref 70–99)

## 2020-10-07 MED ORDER — ENOXAPARIN SODIUM 40 MG/0.4ML ~~LOC~~ SOLN: 40.0000 mg | SUBCUTANEOUS | Status: DC

## 2020-10-07 MED ORDER — FUROSEMIDE 20 MG PO TABS: 20.0000 mg | ORAL_TABLET | Freq: Every day | ORAL | Status: DC

## 2020-10-07 MED ORDER — AMIODARONE HCL 200 MG PO TABS
200.0000 mg | ORAL_TABLET | Freq: Every day | ORAL | Status: DC
  Administered 2020-10-07: 200 mg via ORAL
  Filled 2020-10-07: qty 1

## 2020-10-07 MED ORDER — FUROSEMIDE 10 MG/ML IJ SOLN
40.0000 mg | Freq: Two times a day (BID) | INTRAMUSCULAR | Status: DC
  Administered 2020-10-07: 40 mg via INTRAVENOUS
  Filled 2020-10-07: qty 4

## 2020-10-07 MED ORDER — INSULIN ASPART 100 UNIT/ML ~~LOC~~ SOLN: 0.0000 [IU] | Freq: Every day | SUBCUTANEOUS | Status: DC

## 2020-10-07 MED ORDER — LISINOPRIL 10 MG PO TABS
5.0000 mg | ORAL_TABLET | Freq: Every day | ORAL | Status: DC
  Administered 2020-10-07: 5 mg via ORAL
  Filled 2020-10-07: qty 1

## 2020-10-07 MED ORDER — ACETAMINOPHEN 325 MG PO TABS
650.0000 mg | ORAL_TABLET | ORAL | Status: DC | PRN
  Administered 2020-10-07: 650 mg via ORAL
  Filled 2020-10-07: qty 2

## 2020-10-07 MED ORDER — SODIUM CHLORIDE 0.9 % IV SOLN: 250.0000 mL | INTRAVENOUS | Status: DC | PRN

## 2020-10-07 MED ORDER — POTASSIUM CHLORIDE CRYS ER 20 MEQ PO TBCR
40.0000 meq | EXTENDED_RELEASE_TABLET | Freq: Once | ORAL | Status: AC
  Administered 2020-10-07: 40 meq via ORAL
  Filled 2020-10-07: qty 2

## 2020-10-07 MED ORDER — APIXABAN 5 MG PO TABS
5.0000 mg | ORAL_TABLET | Freq: Two times a day (BID) | ORAL | Status: DC
  Administered 2020-10-07: 5 mg via ORAL
  Filled 2020-10-07 (×2): qty 1

## 2020-10-07 MED ORDER — METOPROLOL SUCCINATE ER 50 MG PO TB24
25.0000 mg | ORAL_TABLET | Freq: Two times a day (BID) | ORAL | Status: DC
  Administered 2020-10-07: 25 mg via ORAL
  Filled 2020-10-07 (×2): qty 1

## 2020-10-07 MED ORDER — SODIUM CHLORIDE 0.9% FLUSH
3.0000 mL | Freq: Two times a day (BID) | INTRAVENOUS | Status: DC
  Administered 2020-10-07 (×2): 3 mL via INTRAVENOUS

## 2020-10-07 MED ORDER — ONDANSETRON HCL 4 MG/2ML IJ SOLN: 4.0000 mg | Freq: Four times a day (QID) | INTRAMUSCULAR | Status: DC | PRN

## 2020-10-07 MED ORDER — SODIUM CHLORIDE 0.9% FLUSH: 3.0000 mL | INTRAVENOUS | Status: DC | PRN

## 2020-10-07 MED ORDER — INSULIN ASPART 100 UNIT/ML ~~LOC~~ SOLN
0.0000 [IU] | Freq: Three times a day (TID) | SUBCUTANEOUS | Status: DC
  Administered 2020-10-07: 5 [IU] via SUBCUTANEOUS
  Administered 2020-10-07: 2 [IU] via SUBCUTANEOUS
  Filled 2020-10-07 (×2): qty 1

## 2020-10-07 NOTE — H&P (Signed)
History and Physical    Breianna Delfino VEH:[id number] DOB: [birth year] DOA: 10/06/2020  PCP: Rusty Aus, MD   Patient coming from: Home  I have personally briefly reviewed patient's old medical records in [place]  Chief Complaint: Shortness of breath  HPI: Edana Aguado is a 86 y.o. female with medical history significant for atrial fibrillation on Eliquis, diabetes, hypertension, hypothyroidism and dementia, who presents to the emergency room with 2 nights of orthopnea.  She denies chest pain, cough, fever or chills.  Has some lower extremity edema.  Patient follows with cardiologist, Dr. Ubaldo Glassing who she saw on 9/30 and at which time she was having rapid A. fib had her metoprolol succinate switched to metoprolol tartrate in order to take an extra dose of metoprolol as needed for heart rate above 100. ED Course: On arrival in the emergency room, vitals were within normal limits.  Blood work significant for troponin 11>19>30, with BNP of 179.  WBC 14,000.  Covid negative Chest x-ray with new streaky predominantly interstitial opacities within the bilateral lung bases which may reflect atelectasis, edema versus atypical/viral infection... EKG as reviewed by me : Sinus rhythm rate of 80 with no acute ST-T wave changes. Patient treated with Lasix.  Hospitalist consulted for admission.  Review of Systems: As per HPI otherwise all other systems on review of systems negative.    Past Medical History:  Diagnosis Date  . Arrhythmia   . Atrial flutter ([place]) 01/2018   new onset   . Basal cell carcinoma of back   . Basal cell carcinoma of lip   . Cerebral aneurysm    followed by [facility]  . DDD (degenerative disc disease), lumbar    superior plate depression, L3 08/18/2014  . Diabetes mellitus type II, controlled ([place])   . Diverticulosis   . Dysrhythmia    Paroxysmal Supraventricular Tachycardia  . Dysthymia    depression  . GERD (gastroesophageal reflux disease)   . History of  meniscal tear   . Hyperlipidemia   . Hypertension   . Hypothyroidism   . Late onset Alzheimer's disease with behavioral disturbance ([place])   . Mild pulmonary hypertension ([place])   . Overactive bladder   . Peripheral vascular disease ([place])   . Seasonal allergic rhinitis   . Sleep apnea     Past Surgical History:  Procedure Laterality Date  . APPENDECTOMY  1946  . BASAL CELL CARCINOMA EXCISION     2006 and 2009 removed from back and lip  . CARDIOVASCULAR STRESS TEST  2015   nuclear cardiac stress test negative for ischemia - Dr. Satira Sark  . CATARACT EXTRACTION, BILATERAL  2006  . COLONOSCOPY  2014  . COLONOSCOPY N/A 08/19/2020   Procedure: COLONOSCOPY;  Surgeon: Lesly Rubenstein, MD;  Location: [facility] ENDOSCOPY;  Service: Endoscopy;  Laterality: N/A;  . [place]  . ESOPHAGOGASTRODUODENOSCOPY N/A 08/19/2020   Procedure: ESOPHAGOGASTRODUODENOSCOPY (EGD);  Surgeon: Lesly Rubenstein, MD;  Location: [facility] ENDOSCOPY;  Service: Endoscopy;  Laterality: N/A;  . INJECTION KNEE Left 05/2015  . REPLACEMENT TOTAL KNEE Left 09/06/2016   Dr. Barnet Pall  . TONSILLECTOMY AND ADENOIDECTOMY  1953  . TOTAL ABDOMINAL HYSTERECTOMY  1986   [facility]     reports that she has never smoked. She has never used smokeless tobacco. She reports current alcohol use. She reports that she does not use drugs.  No Known Allergies  Family History  Problem Relation Age of Onset  . Hypertension Mother   .  CAD Father       Prior to Admission medications   Medication Sig Start Date End Date Taking? Authorizing Provider  amiodarone (PACERONE) 200 MG tablet Take 200 mg by mouth daily.  01/02/19  Yes [provider]  amLODipine (NORVASC) 5 MG tablet Take 1 tablet (5 mg total) by mouth daily. 05/10/20  Yes Danford, Suann Larry, MD  atorvastatin (LIPITOR) 10 MG tablet Take 10 mg by mouth daily.   Yes [provider]  donepezil (ARICEPT) 10 MG tablet Take 10 mg by mouth at bedtime.    Yes [provider]  ELIQUIS 5 MG TABS tablet Take 5 mg by mouth 2 (two) times daily.  05/06/20  Yes [provider]  gabapentin (NEURONTIN) 100 MG capsule Take 100 mg by mouth at bedtime.  05/06/20  Yes [provider]  hydrochlorothiazide (HYDRODIURIL) 12.5 MG tablet Take 12.5 mg by mouth daily.   Yes [provider]  levothyroxine (SYNTHROID) 88 MCG tablet Take 88 mcg by mouth daily.  09/21/19  Yes [provider]  metoprolol tartrate (LOPRESSOR) 25 MG tablet Take 25 mg by mouth 2 (two) times daily. 09/22/20  Yes [provider]  pantoprazole (PROTONIX) 20 MG tablet Take 20 mg by mouth daily. 09/05/20  Yes [provider]  potassium chloride (MICRO-K) 10 MEQ CR capsule Take 10 mEq by mouth 2 (two) times daily.    Yes [provider]  pravastatin (PRAVACHOL) 20 MG tablet Take 20 mg by mouth at bedtime. 07/05/20  Yes [provider]  venlafaxine XR (EFFEXOR-XR) 75 MG 24 hr capsule Take 75 mg by mouth daily with breakfast.    Yes [provider]  furosemide (LASIX) 20 MG tablet Take 1 tablet (20 mg total) by mouth daily as needed for edema. 05/10/20   Danford, Suann Larry, MD  glipiZIDE (GLUCOTROL) 5 MG tablet Take 5 mg by mouth every morning. 05/04/20   [provider]  metFORMIN (GLUCOPHAGE) 1000 MG tablet Take 1,000 mg by mouth daily with breakfast.  03/18/20   [provider]    Physical Exam: Vitals:   10/06/20 2230 10/06/20 2300 10/06/20 2330 10/07/20 0000  BP: (!) 157/87 (!) 163/94 (!) 146/79 127/72  Pulse: 88 79 85 87  Resp:    18  Temp:      TempSrc:      SpO2: 92% 97% 92% 92%  Weight:      Height:         Vitals:   10/06/20 2230 10/06/20 2300 10/06/20 2330 10/07/20 0000  BP: (!) 157/87 (!) 163/94 (!) 146/79 127/72  Pulse: 88 79 85 87  Resp:    18  Temp:      TempSrc:      SpO2: 92% 97% 92% 92%  Weight:      Height:          Constitutional: Alert and oriented x 2 .  Not in any apparent distress HEENT:      Head: Normocephalic and atraumatic.         Eyes: PERLA, EOMI, Conjunctivae are normal. Sclera is non-icteric.       Mouth/Throat: Mucous membranes are moist.       Neck: Supple with no signs of meningismus. Cardiovascular: Regular rate and rhythm. No murmurs, gallops, or rubs. 2+ symmetrical distal pulses are present . No JVD. No traceLE edema Respiratory: Respiratory effort increased.Lungs sounds diminished bilaterally.  Bibasilar crackles Gastrointestinal: Soft, non tender, and non distended with positive bowel sounds. No  rebound or guarding. Genitourinary: No CVA tenderness. Musculoskeletal: Nontender with normal range of motion in all extremities. No cyanosis, or erythema of extremities. Neurologic:  Face is symmetric. Moving all extremities. No gross focal neurologic deficits . Skin: Skin is warm, dry.  No rash or ulcers Psychiatric: Mood and affect are normal    Labs on Admission: I have personally reviewed following labs and imaging studies  CBC: Recent Labs  Lab 10/06/20 1628  WBC 14.7*  HGB 13.6  HCT 40.2  MCV 89.9  PLT 254   Basic Metabolic Panel: Recent Labs  Lab 10/06/20 1628  NA 140  K 3.4*  CL 102  CO2 24  GLUCOSE 111*  BUN 9  CREATININE 0.70  CALCIUM 9.0   GFR: Estimated Creatinine Clearance: 53.2 mL/min (by C-G formula based on SCr of 0.7 mg/dL). Liver Function Tests: No results for input(s): AST, ALT, ALKPHOS, BILITOT, PROT, ALBUMIN in the last 168 hours. No results for input(s): LIPASE, AMYLASE in the last 168 hours. No results for input(s): AMMONIA in the last 168 hours. Coagulation Profile: No results for input(s): INR, PROTIME in the last 168 hours. Cardiac Enzymes: No results for input(s): CKTOTAL, CKMB, CKMBINDEX, TROPONINI in the last 168 hours. BNP (last 3 results) No results for input(s): PROBNP in the last 8760 hours. HbA1C: No results for input(s): HGBA1C in the last 72 hours. CBG: No results  for input(s): GLUCAP in the last 168 hours. Lipid Profile: No results for input(s): CHOL, HDL, LDLCALC, TRIG, CHOLHDL, LDLDIRECT in the last 72 hours. Thyroid Function Tests: No results for input(s): TSH, T4TOTAL, FREET4, T3FREE, THYROIDAB in the last 72 hours. Anemia Panel: No results for input(s): VITAMINB12, FOLATE, FERRITIN, TIBC, IRON, RETICCTPCT in the last 72 hours. Urine analysis:    Component Value Date/Time   COLORURINE YELLOW (A) 05/09/2020 1159   APPEARANCEUR HAZY (A) 05/09/2020 1159   LABSPEC 1.018 05/09/2020 1159   PHURINE 5.0 05/09/2020 1159   GLUCOSEU NEGATIVE 05/09/2020 1159   HGBUR SMALL (A) 05/09/2020 1159   BILIRUBINUR NEGATIVE 05/09/2020 1159   [place] 05/09/2020 1159   PROTEINUR NEGATIVE 05/09/2020 1159   NITRITE NEGATIVE 05/09/2020 1159   LEUKOCYTESUR LARGE (A) 05/09/2020 1159    Radiological Exams on Admission: DG Chest 2 View  Result Date: 10/06/2020 CLINICAL DATA:  Shortness of breath EXAM: CHEST - 2 VIEW COMPARISON:  05/09/2020 FINDINGS: The heart size and mediastinal contours are stable. Atherosclerotic calcification of the aortic knob. New streaky predominantly interstitial opacities within the bilateral lung bases. No large pleural fluid collection. No pneumothorax. The visualized skeletal structures are unremarkable. IMPRESSION: New streaky predominantly interstitial opacities within the bilateral lung bases, which may reflect atelectasis, edema, versus atypical/viral infection. Electronically Signed   By: Davina Poke D.O.   On: 10/06/2020 17:28     Assessment/Plan 86 year old female with history of atrial fibrillation on Eliquis, diabetes, hypertension, hypothyroidism and dementia, presenting with 2-day history of shortness of breath as well as orthopnea and wheezing when lying flat.     Acute CHF (congestive heart failure) ([place]) -Patient presents with shortness of breath and orthopnea, streaky opacities on chest x-ray elevated BNP of  179 -IV Lasix, continue beta-blocker.  ACE inhibitor added -Daily weights intake and output monitoring and fluid restriction -Echocardiogram in the a.m.    Elevated troponin -Slight upward trend.  Suspect demand ischemia -Continue to trend to peak    Paroxysmal atrial fibrillation (HCC) -Currently in sinus rhythm -Continue amiodarone, metoprolol and apixaban    Benign essential hypertension -  Continue metoprolol    Hypothyroidism -Continue levothyroxine    DM type 2 with diabetic mixed hyperlipidemia (HCC) -Sliding scale insulin coverage    PAD (peripheral artery disease) (HCC) -Continue statins, apixaban  Leukocytosis -WBC of 14.7 with chest x-ray showing possible viral infection -Continue to monitor for symptoms of acute respiratory tract infection  DVT prophylaxis: Lovenox  Code Status: DNR, discussed with daughter Adamari Frede Family Communication: Daughter  disposition Plan: Back to previous home environment Consults called: none  Status:.At the time of admission, it appears that the appropriate admission status for this patient is INPATIENT. This is judged to be reasonable and necessary in order to provide the required intensity of service to ensure the patient's safety given the presenting symptoms, physical exam findings, and initial radiographic and laboratory data in the context of their  Comorbid conditions.   Patient requires inpatient status due to high intensity of service, high risk for further deterioration and high frequency of surveillance required.   I certify that at the point of admission it is my clinical judgment that the patient will require inpatient hospital care spanning beyond [place] MD [facility]     10/07/2020, 2:11 AM

## 2020-10-07 NOTE — ED Notes (Signed)
Patient assisted with cleansing self and changing into fresh gown. Pt linens changed as well. Purewick had become dislodged and new one placed. Pt inquired about lab results and plan of care and all discussed. Pt verbalized understanding and denies further questions at this time. Pt breathing easy and unlabored with symmetric chest rise and fall and speaking in full sentences. Patient alert and oriented x4 following commands appropriately. Pt independently cleaned self and positioned back in bed for comfort. Two warm blankets provided per pt request. Pt in bed with bed low and locked and side rails raised x2. Call bell in reach.

## 2020-10-07 NOTE — ED Notes (Signed)
External urinary catheter placed on pt per pt request in lieu of bedpans for urination.

## 2020-10-07 NOTE — ED Notes (Signed)
MD Damita Dunnings contacted regarding pt. Pt requesting snack and apple juice. No NPO order noted at this time but pt has ECHO ordered for in the morning. MD contacted to inquire as to whether or not snack/juice can be provided. RN awaiting response.

## 2020-10-07 NOTE — TOC Initial Note (Signed)
Transition of Care [facility]) - Initial/Assessment Note    Patient Details  Name: Alison Richardson MRN: [id number] Date of Birth: 07-20-1933  Transition of Care [facility]) CM/SW Contact:    Victorino Dike, RN Phone Number: 10/07/2020, 10:12 AM  Clinical Narrative:                  Met with Ms. Moshe Salisbury in ED room this am.  She is very pleasant and feels safe in her home.  She reports a recent where she had to use her walker after not feeling well for a few days.  She reports using this when she has days she does not feel well.  Had a recent visit with a PT in home. Her Residence is at [facility] [employer] and uses resources available in community when needed.  She is agreeable to more resources if needed, but feels confident she will be able to manage in current living environment with Home Health at discharge.  She reports her daughter is also a support to her when needed.    Expected Discharge Plan: [place] Barriers to Discharge: Continued Medical Work up   Patient Goals and CMS Choice Patient states their goals for this hospitalization and ongoing recovery are:: to return to my home CMS Medicare.gov Compare Post Acute Care list provided to:: Patient Choice offered to / list presented to : Patient  Expected Discharge Plan and Services Expected Discharge Plan: [place] In-house Referral: Clinical Social Work Discharge Planning Services: CM Consult Post Acute Care Choice: Home Health (walker) Living arrangements for the past 2 months: [place]                    Prior Living Arrangements/Services Living arrangements for the past 2 months: [place] Lives with:: Self Patient language and need for interpreter reviewed:: Yes Do you feel safe going back to the place where you live?: Yes      Need for Family Participation in Patient Care: No (Comment) Care giver support system in place?: Yes  (comment) Current home services: DME (walker) Criminal Activity/Legal Involvement Pertinent to Current Situation/Hospitalization: No - Comment as needed   Emotional Assessment Appearance:: Appears stated age, Well-Groomed Attitude/Demeanor/Rapport: Ambitious, Engaged, Self-Confident, Gracious Affect (typically observed): Accepting, Appropriate, Calm, Pleasant, Hopeful Orientation: : Oriented to Self, Oriented to Place, Oriented to  Time, Oriented to Situation Alcohol / Substance Use: Not Applicable Psych Involvement: No (comment)  Admission diagnosis:  CHF (congestive heart failure) ([place]) [I50.9] Patient Active Problem List   Diagnosis Date Noted  . Acute CHF (congestive heart failure) ([place]) 10/07/2020  . Elevated troponin 10/07/2020  . CHF (congestive heart failure) ([place]) 10/07/2020  . Leukocytosis 10/07/2020  . DOE (dyspnea on exertion) 05/09/2020  . Syncope and collapse 09/30/2019  . Syncope 09/29/2019  . Diabetes mellitus type 2 in nonobese ([place]) 01/21/2019  . Dyslipidemia 01/21/2019  . PSVT (paroxysmal supraventricular tachycardia) ([place]) 01/21/2019  . Tenosynovitis of foot 01/21/2019  . PAD (peripheral artery disease) ([place]) 01/06/2019  . Preop testing 01/06/2019  . DM type 2 with diabetic mixed hyperlipidemia ([place]) 09/08/2018  . Peripheral neuropathy, idiopathic 09/08/2018  . Anxiety disorder 06/19/2018  . Depression, major, single episode, mild ([place]) 06/19/2018  . Paroxysmal atrial fibrillation ([place]) 06/19/2018  . Atrial flutter ([place]) 06/19/2018  . Pure hypercholesterolemia 06/19/2018  . Vitamin B12 deficiency 06/19/2018  . Insomnia 06/19/2018  . Protein-calorie malnutrition ([place]) 06/19/2018  . Type 2 diabetes  mellitus with diabetic neuropathy ([place]) 06/19/2018  . Knee pain 06/19/2018  . Cognitive impairment, mild, so stated 06/19/2018  . Osteoarthritis of right knee 06/19/2018  . Benign essential hypertension 06/19/2018  . Hypothyroidism 06/19/2018  . Overactive  bladder 06/19/2018  . Generalized muscle weakness 06/19/2018  . Difficulty in walking, not elsewhere classified 06/19/2018  . Major depression in remission ([place]) 06/18/2018  . Primary osteoarthritis involving multiple joints 06/18/2018  . Mixed hyperlipidemia 11/01/2015  . Cerebral aneurysm 04/29/2013   PCP:  Rusty Aus, MD Pharmacy:   [place], [place] [address] [place] [address] Phone: [phone] Fax: [phone]      Readmission Risk Interventions No flowsheet data found.

## 2020-10-07 NOTE — ED Notes (Signed)
Pt given water at this time 

## 2020-10-07 NOTE — Discharge Summary (Signed)
Physician Discharge Summary   Alison Richardson  female DOB: 01/04/1933  QQV:[id number]  PCP: Rusty Aus, MD  Admit date: 10/06/2020 Discharge date: 10/07/2020  Admitted From: home Disposition:  home CODE STATUS: DNR  Discharge Instructions    Discharge instructions   Complete by: As directed    Dr. Ubaldo Glassing had seen you in the hospital, and he is ok with you going home and follow up with him in clinic.    Dr. Ubaldo Glassing wants you to take Lasix 20 mg daily.   Dr. Enzo Bi [facility] Course:  For full details, please see H&P, progress notes, consult notes and ancillary notes.  Briefly,  Alison Richardson is a 86 y.o. female with medical history significant for atrial fibrillation on Eliquis, diabetes, hypertension, hypothyroidism and dementia, who presented to the emergency room with 2 nights of orthopnea.    She denied chest pain, cough, fever or chills.  Had some lower extremity edema.  Patient follows with cardiologist, Dr. Ubaldo Glassing who she saw on 9/30 and at which time she was having rapid A. fib had her metoprolol succinate switched to metoprolol tartrate in order to take an extra dose of metoprolol as needed for heart rate above 100.  Acute CHF (congestive heart failure) ([place]) Patient presented with shortness of breath and orthopnea, streaky opacities on chest x-ray elevated BNP of 179.  Pt received IV lasix 40 mg x2 with improvement in symptoms.  TTE showed EF 60-65% and grade I diastolic dysfunction.  Continued home metop.  Dr. Ubaldo Glassing saw pt in the ED and cleared pt for discharge with Lasix 40 mg daily and followup with him in clinic.    Elevated troponin Mild, 19 and 30.  Suspect demand ischemia.    Paroxysmal atrial fibrillation (HCC) Currently in sinus rhythm.  Continued amiodarone, metoprolol and apixaban    Benign essential hypertension Continued metoprolol    Hypothyroidism Continued levothyroxine    DM type 2 with diabetic mixed hyperlipidemia (HCC) A1c  7.1, controlled.  Pt discharged on home metformin.    PAD (peripheral artery disease) (HCC) Continued statin.  Leukocytosis WBC of 14.7.  No fever, no signs of infection.     Discharge Diagnoses:  Principal Problem:   Acute CHF (congestive heart failure) (HCC) Active Problems:   Paroxysmal atrial fibrillation (HCC)   Benign essential hypertension   Hypothyroidism   DM type 2 with diabetic mixed hyperlipidemia (HCC)   PAD (peripheral artery disease) (HCC)   Elevated troponin   CHF (congestive heart failure) (HCC)   Leukocytosis    Discharge Instructions:  Allergies as of 10/07/2020   No Known Allergies     Medication List    TAKE these medications   amiodarone 200 MG tablet Commonly known as: PACERONE Take 200 mg by mouth daily.   donepezil 10 MG tablet Commonly known as: ARICEPT Take 10 mg by mouth at bedtime.   Eliquis 5 MG Tabs tablet Generic drug: apixaban Take 5 mg by mouth 2 (two) times daily.   furosemide 20 MG tablet Commonly known as: LASIX Take 1 tablet (20 mg total) by mouth daily.   gabapentin 100 MG capsule Commonly known as: NEURONTIN Take 100 mg by mouth at bedtime.   levothyroxine 88 MCG tablet Commonly known as: SYNTHROID Take 88 mcg by mouth daily.   metFORMIN 1000 MG tablet Commonly known as: GLUCOPHAGE Take 1,000 mg by mouth 2 (two) times daily.   metoprolol tartrate 25 MG tablet Commonly  known as: LOPRESSOR Take 25 mg by mouth 2 (two) times daily.   pantoprazole 20 MG tablet Commonly known as: PROTONIX Take 20 mg by mouth daily.   potassium chloride 10 MEQ CR capsule Commonly known as: MICRO-K Take 10 mEq by mouth 2 (two) times daily.   pravastatin 20 MG tablet Commonly known as: PRAVACHOL Take 20 mg by mouth at bedtime.   venlafaxine XR 75 MG 24 hr capsule Commonly known as: EFFEXOR-XR Take 75 mg by mouth daily with breakfast.        Follow-up Information    [facility] Follow up on 10/20/2020.   Specialty: Cardiology Why: at 11:00am. Enter through the [place] entrance Contact information: [place] [phone]       Teodoro Spray, MD Follow up on 10/12/2020.   Specialty: Cardiology Why: 2:30 Contact information: [place] [address] [phone]        Rusty Aus, MD. Schedule an appointment as soon as possible for a visit in 1 week(s).   Specialty: Internal Medicine Contact information: [place] [address] [phone]               No Known Allergies   The results of significant diagnostics from this hospitalization (including imaging, microbiology, ancillary and laboratory) are listed below for reference.   Consultations:   Procedures/Studies: DG Chest 2 View  Result Date: 10/06/2020 CLINICAL DATA:  Shortness of breath EXAM: CHEST - 2 VIEW COMPARISON:  05/09/2020 FINDINGS: The heart size and mediastinal contours are stable. Atherosclerotic calcification of the aortic knob. New streaky predominantly interstitial opacities within the bilateral lung bases. No large pleural fluid collection. No pneumothorax. The visualized skeletal structures are unremarkable. IMPRESSION: New streaky predominantly interstitial opacities within the bilateral lung bases, which may reflect atelectasis, edema, versus atypical/viral infection. Electronically Signed   By: Davina Poke D.O.   On: 10/06/2020 17:28   ECHOCARDIOGRAM COMPLETE  Result Date: 10/07/2020    ECHOCARDIOGRAM REPORT   Patient Name:   Alison Richardson Date of Exam: 10/07/2020 Medical Rec #:  [id number]      Height:       66.0 in Accession #:    [id number]     Weight:       165.0 lb Date of Birth:  [birth year]       BSA:          1.843 m Patient Age:    28 years       BP:           133/65 mmHg Patient Gender: F              HR:            67 bpm. Exam Location:  [facility] Procedure: 2D Echo, Color Doppler and Cardiac Doppler Indications:     CHF- acute diastolic 299.37  History:         Patient has prior history of Echocardiogram examinations, most                  recent 09/29/2019. Pulmonary HTN, Arrythmias:Atrial Flutter;                  Risk Factors:Hypertension and Diabetes.  Sonographer:     [name] RDCS (AE) Referring Phys:  [id number] Athena Masse Diagnosing Phys: Bartholome Bill MD  Sonographer Comments: No apical window  and no subcostal window. IMPRESSIONS  1. Left ventricular ejection fraction, by estimation, is 60 to 65%. The left ventricle has normal function. The left ventricle has no regional wall motion abnormalities. Left ventricular diastolic parameters are consistent with Grade I diastolic dysfunction (impaired relaxation).  2. Right ventricular systolic function is normal. The right ventricular size is normal.  3. The mitral valve is grossly normal. Mild mitral valve regurgitation.  4. The aortic valve is normal in structure. Aortic valve regurgitation is trivial. FINDINGS  Left Ventricle: Left ventricular ejection fraction, by estimation, is 60 to 65%. The left ventricle has normal function. The left ventricle has no regional wall motion abnormalities. The left ventricular internal cavity size was normal in size. There is  borderline left ventricular hypertrophy. Left ventricular diastolic parameters are consistent with Grade I diastolic dysfunction (impaired relaxation). Right Ventricle: The right ventricular size is normal. No increase in right ventricular wall thickness. Right ventricular systolic function is normal. Left Atrium: Left atrial size was normal in size. Right Atrium: Right atrial size was normal in size. Pericardium: There is no evidence of pericardial effusion. Mitral Valve: The mitral valve is grossly normal. Mild mitral valve regurgitation. Tricuspid Valve: The tricuspid valve is not well visualized. Tricuspid  valve regurgitation is trivial. Aortic Valve: The aortic valve is normal in structure. Aortic valve regurgitation is trivial. Pulmonic Valve: The pulmonic valve was not well visualized. Pulmonic valve regurgitation is trivial. Aorta: The aortic root is normal in size and structure. IAS/Shunts: The interatrial septum was not assessed.  LEFT VENTRICLE PLAX 2D LVIDd:         3.95 cm LVIDs:         2.54 cm LV PW:         1.15 cm LV IVS:        1.00 cm LVOT diam:     2.00 cm LVOT Area:     3.14 cm  LEFT ATRIUM         Index LA diam:    4.60 cm 2.50 cm/m                        PULMONIC VALVE AORTA                 PV Vmax:        0.79 m/s Ao Root diam: 2.60 cm PV Peak grad:   2.5 mmHg                       RVOT Peak grad: 3 mmHg  TRICUSPID VALVE TR Peak grad:   36.2 mmHg TR Vmax:        301.00 cm/s  SHUNTS Systemic Diam: 2.00 cm Bartholome Bill MD Electronically signed by Bartholome Bill MD Signature Date/Time: 10/07/2020/12:27:26 PM    Final       Labs: BNP (last 3 results) Recent Labs    10/06/20 1628  BNP 885.0*   Basic Metabolic Panel: Recent Labs  Lab 10/06/20 1628  NA 140  K 3.4*  CL 102  CO2 24  GLUCOSE 111*  BUN 9  CREATININE 0.70  CALCIUM 9.0   Liver Function Tests: No results for input(s): AST, ALT, ALKPHOS, BILITOT, PROT, ALBUMIN in the last 168 hours. No results for input(s): LIPASE, AMYLASE in the last 168 hours. No results for input(s): AMMONIA in the last 168 hours. CBC: Recent Labs  Lab 10/06/20 1628  WBC 14.7*  HGB 13.6  HCT 40.2  MCV 89.9  PLT 269   Cardiac Enzymes: No results for input(s): CKTOTAL, CKMB, CKMBINDEX, TROPONINI in the last 168 hours. BNP: Invalid input(s): POCBNP CBG: Recent Labs  Lab 10/07/20 0809 10/07/20 1251  GLUCAP 230* 137*   D-Dimer No results for input(s): DDIMER in the last 72 hours. Hgb A1c No results for input(s): HGBA1C in the last 72 hours. Lipid Profile No results for input(s): CHOL, HDL, LDLCALC, TRIG, CHOLHDL, LDLDIRECT in  the last 72 hours. Thyroid function studies No results for input(s): TSH, T4TOTAL, T3FREE, THYROIDAB in the last 72 hours.  Invalid input(s): FREET3 Anemia work up No results for input(s): VITAMINB12, FOLATE, FERRITIN, TIBC, IRON, RETICCTPCT in the last 72 hours. Urinalysis    Component Value Date/Time   COLORURINE YELLOW (A) 05/09/2020 1159   APPEARANCEUR HAZY (A) 05/09/2020 1159   LABSPEC 1.018 05/09/2020 1159   PHURINE 5.0 05/09/2020 1159   GLUCOSEU NEGATIVE 05/09/2020 1159   HGBUR SMALL (A) 05/09/2020 1159   BILIRUBINUR NEGATIVE 05/09/2020 1159   KETONESUR NEGATIVE 05/09/2020 1159   PROTEINUR NEGATIVE 05/09/2020 1159   NITRITE NEGATIVE 05/09/2020 1159   LEUKOCYTESUR LARGE (A) 05/09/2020 1159   Sepsis Labs Invalid input(s): PROCALCITONIN,  WBC,  LACTICIDVEN Microbiology Recent Results (from the past 240 hour(s))  Respiratory Panel by RT PCR (Flu A&B, Covid) - Nasopharyngeal Swab     Status: None   Collection Time: 10/06/20 10:20 PM   Specimen: Nasopharyngeal Swab  Result Value Ref Range Status   SARS Coronavirus 2 by RT PCR NEGATIVE NEGATIVE Final    Comment: (NOTE) SARS-CoV-2 target nucleic acids are NOT DETECTED.  The SARS-CoV-2 RNA is generally detectable in upper respiratoy specimens during the acute phase of infection. The lowest concentration of SARS-CoV-2 viral copies this assay can detect is 131 copies/mL. A negative result does not preclude SARS-Cov-2 infection and should not be used as the sole basis for treatment or other patient management decisions. A negative result may occur with  improper specimen collection/handling, submission of specimen other than nasopharyngeal swab, presence of viral mutation(s) within the areas targeted by this assay, and inadequate number of viral copies (<131 copies/mL). A negative result must be combined with clinical observations, patient history, and epidemiological information. The expected result is Negative.  Fact Sheet  for Patients:  PinkCheek.be  Fact Sheet for Healthcare Providers:  GravelBags.it  This test is no t yet approved or cleared by the [place] FDA and  has been authorized for detection and/or diagnosis of SARS-CoV-2 by FDA under an Emergency Use Authorization (EUA). This EUA will remain  in effect (meaning this test can be used) for the duration of the COVID-19 declaration under Section 564(b)(1) of the Act, 21 U.S.C. section 360bbb-3(b)(1), unless the authorization is terminated or revoked sooner.     Influenza A by PCR NEGATIVE NEGATIVE Final   Influenza B by PCR NEGATIVE NEGATIVE Final    Comment: (NOTE) The Xpert Xpress SARS-CoV-2/FLU/RSV assay is intended as an aid in  the diagnosis of influenza from Nasopharyngeal swab specimens and  should not be used as a sole basis for treatment. Nasal washings and  aspirates are unacceptable for Xpert Xpress SARS-CoV-2/FLU/RSV  testing.  Fact Sheet for Patients: PinkCheek.be  Fact Sheet for Healthcare Providers: GravelBags.it  This test is not yet approved or cleared by the [place] FDA and  has been authorized for detection and/or diagnosis of SARS-CoV-2 by  FDA under an Emergency Use Authorization (EUA). This EUA will remain  in effect (meaning this test  can be used) for the duration of the  Covid-19 declaration under Section 564(b)(1) of the Act, 21  U.S.C. section 360bbb-3(b)(1), unless the authorization is  terminated or revoked. Performed at [facility], [address] [place], [place] [address]      Total time spend on discharging this patient, including the last patient exam, discussing the hospital stay, instructions for ongoing care as it relates to all pertinent caregivers, as well as preparing the medical discharge records, prescriptions, and/or referrals as applicable, is 45  minutes.    Enzo Bi, MD  [facility] 10/07/2020, 2:04 PM  If 7PM-7AM, please contact night-coverage

## 2020-10-07 NOTE — ED Notes (Signed)
Pt used bedpan, brief and linens changed. Pt denies further needs at this time.

## 2020-10-07 NOTE — ED Notes (Signed)
VS updated and pt had questions regarding procedure of ordered ECHO. Procedure explained to the best of this RN ability and pt denies further questions. Pt assisted with positioning in bed for comfort and RN ensured Purewick remains in place and functional. Pt breathing easy and unlabored with symmetric chest rise/fall and speaking in full sentences. Pt in bed with bed low and locked and side rails raised x2. Call bell in reach. Pt requested phone and glasses out of purse and items provided.

## 2020-10-07 NOTE — ED Notes (Signed)
Patient provided with snack and juice per physician approval

## 2020-10-07 NOTE — Progress Notes (Signed)
*  PRELIMINARY RESULTS* Echocardiogram 2D Echocardiogram has been performed.  [name] 10/07/2020, 9:07 AM

## 2020-10-07 NOTE — ED Notes (Signed)
pts bed linens changed at this time.  Pt assisted with walked to sink to brush teeth.  Pt required minimal assistance w/walker to get to sink.  Pt requesting to not use pure-wick external catheter, as her bed got wet when she urinated.

## 2020-10-07 NOTE — ED Notes (Addendum)
Pt daughter called in regards to pt leaving DNR and other paperwork in ED. Forms placed in an envelope with pt sticker on it. Envelope given to charge.

## 2020-10-07 NOTE — Care Management CC44 (Signed)
Condition Code 44 Documentation Completed  Patient Details  Name: Alison Richardson MRN: [id number] Date of Birth: May 16, 1933   Condition Code 44 given:  Yes Patient signature on Condition Code 44 notice:  Yes Documentation of 2 MD's agreement:  Yes Code 44 added to claim:  Yes    Victorino Dike, RN 10/07/2020, 2:23 PM

## 2020-10-07 NOTE — Progress Notes (Signed)
   Heart Failure Nurse Navigator Note  HFpEF- EF 60- 65 %, Grade I Diastolic Dysfunction  She presented to the ED with complaints of orthopnea for 2 nights. SOB with exertion and wheezing that she noted when lying down.  Co morbidities:  Paroxsymal A fib Diabetes type II Hypertension Hyperlipidemia OSA  Medications:  Amiodarone 200 mg daily Norvasc 5 mg daily Lipitor 20 mg daily HCTZ 12.5 mg daily Metoprolol tartrate 25 mg BID Lasix 20 mg daily  Labs:  Sodium 140, potassium 3.4, BUN 9, Creatinine 0.7, troponin 30, 19, BNP 179  BP 127/72 pulse 87    Assessment:  General - very pleasant, awake and alert.  HEENT- pupils equal, normocephalic  Cardiac-heart tones regular.  Chest- clear to posterior auscultation.  Abdomen- soft and rounded.  Musculoskeletal- trace bilateral edema of lower extremities.  Psych- pleasant and appropriate.  Neuro-moves all extremities, speech clear,    Patient was given heart failure teaching booklet and zone magnet.  Discussed her symptoms that lead to coming to ED.  She had noted weight gain, early satiety, SOB with exertion and increasing fatigue.  Reinforced that these are the things she needs to report to Dr. Ubaldo Glassing.  She voices understanding.  Also discussed low sodium diet and fluid restriction.  Pricilla Riffle RN,CHFN

## 2020-10-07 NOTE — ED Provider Notes (Signed)
-----------------------------------------   1:49 AM on 10/07/2020 -----------------------------------------  Room air saturations 92%.  Patient was not hypoxic but with increased work of breathing on ambulation trial.  Troponin continues to trend upwards, suspect secondary to demand ischemia.  Has begun to have urine output after IV Lasix.  Will discuss with hospitalist services for admission.   Paulette Blanch, MD 10/07/20 [phone]

## 2020-10-07 NOTE — ED Notes (Signed)
Admitting MD at bedside. Pt is AOX4, NAD noted.

## 2020-10-07 NOTE — ED Notes (Signed)
Pharmacy messaged requesting dose of Eliquis and Metoprolol be re-timed for 1000 in order to be in keeping with ordered schedule of Q12 hr during time of admission. Awaiting response.

## 2020-10-07 NOTE — ED Notes (Signed)
Pharmacy messaged to evaluate need for re-time on ordered 0600 dose of IV lasix. Last dose administered was at 0029 on 10/07/20. Awaiting response.

## 2020-10-08 LAB — HEMOGLOBIN A1C
Hgb A1c MFr Bld: 7.1 % — ABNORMAL HIGH (ref 4.8–5.6)
Mean Plasma Glucose: 157 mg/dL

## 2020-10-10 ENCOUNTER — Telehealth: Payer: Self-pay

## 2020-10-13 DIAGNOSIS — I5033 Acute on chronic diastolic (congestive) heart failure: Secondary | ICD-10-CM | POA: Insufficient documentation

## 2020-10-13 DIAGNOSIS — I5032 Chronic diastolic (congestive) heart failure: Secondary | ICD-10-CM | POA: Insufficient documentation

## 2020-10-19 NOTE — Progress Notes (Deleted)
   Patient ID: Alison Richardson, female    DOB: 1933-12-14, 86 y.o.   MRN: [id number]  HPI  Alison Richardson is a 86 y/o female with a history of   Echo report from 10/07/20 reviewed and showed an EF of 60-65% along with mild MR.   Was in the ED 10/06/20 due to shortness of breath. IV lasix given. COVID negative and she was released.   She presents today for her initial visit with a chief complaint of   Review of Systems    Physical Exam  Assessment & Plan:  1: Chronic heart failure with preserved ejection fraction without structural changes- - NYHA class - saw cardiology (Alison Richardson) 10/12/20 - BNP 10/06/20 was 179.0  2: HTN- - BP - saw PCP Alison Richardson) 07/05/20 - BMP 10/06/20 reviewed and showed sodium 140, potassium 3.4, creatinine 0.7 and GFR >60  3: DM- - A1c 10/07/20 was 7.1%  4: Atrial fibrillation-

## 2020-10-20 ENCOUNTER — Ambulatory Visit: Payer: Medicare PPO | Admitting: Family

## 2020-10-20 ENCOUNTER — Telehealth: Payer: Self-pay | Admitting: Family

## 2020-10-20 NOTE — Telephone Encounter (Signed)
Patient did not show for her Heart Failure Clinic appointment on 10/20/20. Will attempt to reschedule.

## 2020-11-15 ENCOUNTER — Telehealth: Payer: Self-pay | Admitting: Family

## 2020-11-15 ENCOUNTER — Ambulatory Visit: Payer: Medicare PPO | Admitting: Family

## 2020-11-15 NOTE — Telephone Encounter (Signed)
Patient did not show for her Heart Failure Clinic appointment on 11/15/20. Will attempt to reschedule.

## 2020-11-21 NOTE — Progress Notes (Deleted)
   Patient ID: Alison Richardson, female    DOB: 06-May-1933, 86 y.o.   MRN: [id number]  HPI  Alison Richardson is a 86 y/o female with a history of  Echo report from 10/07/20 reviewed and showed an EF of 60-65% along with mild MR but without regional wall motion abnormalities.   Was in the ED 10/06/20 due to acute pulmonary edema. COVID negative. Given IV lasix with good urinary outpatient. Released the following day.   She presents today for her initial visit with a chief complaint of  Review of Systems    Physical Exam    Assessment & Plan:  1: Chronic heart failure with preserved ejection fraction without structural changes- - NYHA class - saw cardiology (Fath) 11/09/20 - BNP 10/06/20 was 179.0  2: HTN- - BP - saw PCP Sabra Heck) 11/03/20 - BMP 11/03/20 reviewed and showed sodium 139, potassium 3.9, creatinine 0.8 and GFR 68  3: DM- - A1c 10/07/20 was 7.1%  4: Alzheimer's-

## 2020-11-22 ENCOUNTER — Ambulatory Visit: Payer: Medicare PPO | Admitting: Family

## 2020-11-23 ENCOUNTER — Other Ambulatory Visit: Payer: Self-pay

## 2020-11-23 ENCOUNTER — Ambulatory Visit: Payer: Medicare PPO | Attending: Family | Admitting: Family

## 2020-11-23 ENCOUNTER — Encounter: Payer: Self-pay | Admitting: Family

## 2020-11-23 ENCOUNTER — Telehealth: Payer: Self-pay | Admitting: Family

## 2020-11-23 ENCOUNTER — Ambulatory Visit: Payer: Medicare PPO | Admitting: Family

## 2020-11-23 VITALS — BP 113/75 | HR 111 | Resp 18 | Ht 66.0 in | Wt 168.5 lb

## 2020-11-23 DIAGNOSIS — Z7901 Long term (current) use of anticoagulants: Secondary | ICD-10-CM | POA: Insufficient documentation

## 2020-11-23 DIAGNOSIS — E1169 Type 2 diabetes mellitus with other specified complication: Secondary | ICD-10-CM | POA: Diagnosis not present

## 2020-11-23 DIAGNOSIS — Z7984 Long term (current) use of oral hypoglycemic drugs: Secondary | ICD-10-CM | POA: Diagnosis not present

## 2020-11-23 DIAGNOSIS — Z79899 Other long term (current) drug therapy: Secondary | ICD-10-CM | POA: Insufficient documentation

## 2020-11-23 DIAGNOSIS — G301 Alzheimer's disease with late onset: Secondary | ICD-10-CM | POA: Insufficient documentation

## 2020-11-23 DIAGNOSIS — R519 Headache, unspecified: Secondary | ICD-10-CM | POA: Insufficient documentation

## 2020-11-23 DIAGNOSIS — R0789 Other chest pain: Secondary | ICD-10-CM | POA: Insufficient documentation

## 2020-11-23 DIAGNOSIS — I4892 Unspecified atrial flutter: Secondary | ICD-10-CM | POA: Diagnosis not present

## 2020-11-23 DIAGNOSIS — K219 Gastro-esophageal reflux disease without esophagitis: Secondary | ICD-10-CM | POA: Insufficient documentation

## 2020-11-23 DIAGNOSIS — I739 Peripheral vascular disease, unspecified: Secondary | ICD-10-CM | POA: Insufficient documentation

## 2020-11-23 DIAGNOSIS — Z85828 Personal history of other malignant neoplasm of skin: Secondary | ICD-10-CM | POA: Insufficient documentation

## 2020-11-23 DIAGNOSIS — F0281 Dementia in other diseases classified elsewhere with behavioral disturbance: Secondary | ICD-10-CM | POA: Insufficient documentation

## 2020-11-23 DIAGNOSIS — Z8249 Family history of ischemic heart disease and other diseases of the circulatory system: Secondary | ICD-10-CM | POA: Diagnosis not present

## 2020-11-23 DIAGNOSIS — I5032 Chronic diastolic (congestive) heart failure: Secondary | ICD-10-CM | POA: Diagnosis not present

## 2020-11-23 DIAGNOSIS — R059 Cough, unspecified: Secondary | ICD-10-CM | POA: Insufficient documentation

## 2020-11-23 DIAGNOSIS — E079 Disorder of thyroid, unspecified: Secondary | ICD-10-CM | POA: Insufficient documentation

## 2020-11-23 DIAGNOSIS — E782 Mixed hyperlipidemia: Secondary | ICD-10-CM | POA: Insufficient documentation

## 2020-11-23 DIAGNOSIS — R5383 Other fatigue: Secondary | ICD-10-CM | POA: Insufficient documentation

## 2020-11-23 DIAGNOSIS — I671 Cerebral aneurysm, nonruptured: Secondary | ICD-10-CM | POA: Insufficient documentation

## 2020-11-23 DIAGNOSIS — R0602 Shortness of breath: Secondary | ICD-10-CM | POA: Diagnosis not present

## 2020-11-23 DIAGNOSIS — I272 Pulmonary hypertension, unspecified: Secondary | ICD-10-CM | POA: Insufficient documentation

## 2020-11-23 DIAGNOSIS — G4730 Sleep apnea, unspecified: Secondary | ICD-10-CM | POA: Diagnosis not present

## 2020-11-23 DIAGNOSIS — I110 Hypertensive heart disease with heart failure: Secondary | ICD-10-CM | POA: Insufficient documentation

## 2020-11-23 DIAGNOSIS — I10 Essential (primary) hypertension: Secondary | ICD-10-CM

## 2020-11-23 DIAGNOSIS — F32A Depression, unspecified: Secondary | ICD-10-CM | POA: Diagnosis not present

## 2020-11-23 LAB — GLUCOSE, CAPILLARY: Glucose-Capillary: 265 mg/dL — ABNORMAL HIGH (ref 70–99)

## 2020-11-23 NOTE — Telephone Encounter (Addendum)
Patient showed up 2 hours late for her initial HF Clinic appointment but we were able to work her in to be seen. She had a previous appt scheduled that she was also 2 hours late for.

## 2020-11-23 NOTE — Patient Instructions (Addendum)
Continue weighing daily and call for an overnight weight gain of > 2 pounds or a weekly weight gain of >5 pounds.   Drink between 50-64 ounces of fluids daily   Call us in the future if you'd like to schedule another appointment

## 2020-11-23 NOTE — Progress Notes (Signed)
Patient ID: Alison Richardson, female    DOB: 1933/07/16, 86 y.o.   MRN: [id number]  HPI  Ms Stangeland is a 86 y/o female with a history of skin cancer, DM, hyperlipidemia, HTN, thyroid disease, GERD, cerebral aneurysm, atrial flutter, mild pulmonary HTN, PVD, sleep apnea, late onset alzheimer's disease and chronic heart failure.   Echo report from 10/07/20 reviewed and showed an EF of 60-65% along with mild MR but without regional wall motion abnormalities.   Was in the ED 10/06/20 due to acute pulmonary edema. COVID negative. Given IV lasix with good urinary outpatient. Released the following day.   She presents today for her initial visit with a chief complaint of moderate fatigue upon minimal exertion. She describes this as chronic in nature having been present for several years. She has associated cough, shortness of breath, headaches, chronic difficulty sleeping and occasional chest tightness along with this. She denies any dizziness, abdominal distention, palpitations, pedal edema, chest pain or weight gain.   Weighing herself daily. Does not add additional salt but does eat in the cafeteria at [facility] and is aware that it's probably cooked in salt. Does like to eat a ham biscuit every couple of weeks and also enjoys eating canned sardines.   Past Medical History:  Diagnosis Date  . Arrhythmia   . Atrial flutter ([place]) 01/2018   new onset   . Basal cell carcinoma of back   . Basal cell carcinoma of lip   . Cerebral aneurysm    followed by [facility]  . CHF (congestive heart failure) ([place])   . DDD (degenerative disc disease), lumbar    superior plate depression, L3 08/18/2014  . Diabetes mellitus type II, controlled ([place])   . Diverticulosis   . Dysrhythmia    Paroxysmal Supraventricular Tachycardia  . Dysthymia    depression  . GERD (gastroesophageal reflux disease)   . History of meniscal tear   . Hyperlipidemia   . Hypertension   . Hypothyroidism   . Late onset  Alzheimer's disease with behavioral disturbance ([place])   . Mild pulmonary hypertension ([place])   . Overactive bladder   . Peripheral vascular disease ([place])   . Seasonal allergic rhinitis   . Sleep apnea    Past Surgical History:  Procedure Laterality Date  . APPENDECTOMY  1946  . BASAL CELL CARCINOMA EXCISION     2006 and 2009 removed from back and lip  . CARDIOVASCULAR STRESS TEST  2015   nuclear cardiac stress test negative for ischemia - Dr. Satira Sark  . CATARACT EXTRACTION, BILATERAL  2006  . COLONOSCOPY  2014  . COLONOSCOPY N/A 08/19/2020   Procedure: COLONOSCOPY;  Surgeon: Lesly Rubenstein, MD;  Location: [facility] ENDOSCOPY;  Service: Endoscopy;  Laterality: N/A;  . [place]  . ESOPHAGOGASTRODUODENOSCOPY N/A 08/19/2020   Procedure: ESOPHAGOGASTRODUODENOSCOPY (EGD);  Surgeon: Lesly Rubenstein, MD;  Location: [facility] ENDOSCOPY;  Service: Endoscopy;  Laterality: N/A;  . INJECTION KNEE Left 05/2015  . REPLACEMENT TOTAL KNEE Left 09/06/2016   Dr. Barnet Pall  . TONSILLECTOMY AND ADENOIDECTOMY  1953  . TOTAL ABDOMINAL HYSTERECTOMY  1986   [facility]   Family History  Problem Relation Age of Onset  . Hypertension Mother   . CAD Father    Social History   Tobacco Use  . Smoking status: Never Smoker  . Smokeless tobacco: Never Used  Substance Use Topics  . Alcohol use: Yes    Comment: 3 x week    No  Known Allergies Prior to Admission medications   Medication Sig Start Date End Date Taking? Authorizing Provider  amiodarone (PACERONE) 200 MG tablet Take 200 mg by mouth daily.  01/02/19  Yes [provider]  donepezil (ARICEPT) 10 MG tablet Take 10 mg by mouth at bedtime.   Yes [provider]  ELIQUIS 5 MG TABS tablet Take 5 mg by mouth 2 (two) times daily.  05/06/20  Yes [provider]  furosemide (LASIX) 20 MG tablet Take 1 tablet (20 mg total) by mouth daily. 10/07/20  Yes Enzo Bi, MD  gabapentin (NEURONTIN) 100 MG capsule Take 100 mg by  mouth at bedtime.  05/06/20  Yes [provider]  levothyroxine (SYNTHROID) 88 MCG tablet Take 88 mcg by mouth daily.  09/21/19  Yes [provider]  metFORMIN (GLUCOPHAGE) 1000 MG tablet Take 1,000 mg by mouth 2 (two) times daily.  03/18/20  Yes [provider]  metoprolol tartrate (LOPRESSOR) 25 MG tablet Take 25 mg by mouth 2 (two) times daily. 09/22/20  Yes [provider]  potassium chloride (MICRO-K) 10 MEQ CR capsule Take 10 mEq by mouth 2 (two) times daily.    Yes [provider]  pravastatin (PRAVACHOL) 20 MG tablet Take 20 mg by mouth at bedtime. 07/05/20  Yes [provider]  venlafaxine XR (EFFEXOR-XR) 75 MG 24 hr capsule Take 75 mg by mouth daily with breakfast.    Yes [provider]  pantoprazole (PROTONIX) 20 MG tablet Take 20 mg by mouth daily. Patient not taking: Reported on 11/23/2020 09/05/20   [provider]    Review of Systems  Constitutional: Positive for fatigue (easily). Negative for appetite change.  HENT: Positive for congestion. Negative for postnasal drip and sore throat.   Eyes: Negative.   Respiratory: Positive for cough, chest tightness (at times) and shortness of breath.   Cardiovascular: Negative for chest pain, palpitations and leg swelling.  Gastrointestinal: Negative for abdominal distention and abdominal pain.  Endocrine: Negative.   Genitourinary: Negative.   Musculoskeletal: Positive for arthralgias (right shoulder). Negative for back pain.  Skin: Negative.   Allergic/Immunologic: Negative.   Neurological: Positive for headaches (due to right shoulder pain). Negative for dizziness and light-headedness.       Off-balance  Hematological: Negative for adenopathy. Does not bruise/bleed easily.  Psychiatric/Behavioral: Positive for sleep disturbance (sleeping on 1 pillow). Negative for dysphoric mood. The patient is not nervous/anxious.     Vitals:   11/23/20 1122  BP: 113/75  Pulse:  (!) 111  Resp: 18  SpO2: 100%  Weight: 168 lb 8 oz (76.4 kg)  Height: 5\' 6"  (1.676 m)   Wt Readings from Last 3 Encounters:  11/23/20 168 lb 8 oz (76.4 kg)  10/06/20 165 lb (74.8 kg)  08/19/20 168 lb (76.2 kg)   Lab Results  Component Value Date   CREATININE 0.70 10/06/2020   CREATININE 0.74 05/09/2020   CREATININE 0.58 09/30/2019    Physical Exam Vitals and nursing note reviewed.  Constitutional:      Appearance: She is well-developed.  HENT:     Head: Normocephalic and atraumatic.  Cardiovascular:     Rate and Rhythm: Regular rhythm. Tachycardia present.  Pulmonary:     Effort: Pulmonary effort is normal. No respiratory distress.     Breath sounds: No wheezing or rales.  Abdominal:     Palpations: Abdomen is soft.     Tenderness: There is no abdominal tenderness.  Musculoskeletal:     Right lower  leg: No tenderness. No edema.     Left lower leg: No tenderness. No edema.  Skin:    General: Skin is warm and dry.  Neurological:     General: No focal deficit present.     Mental Status: She is alert and oriented to person, place, and time.  Psychiatric:        Mood and Affect: Mood normal.        Behavior: Behavior normal.    Assessment & Plan:  1: Chronic heart failure with preserved ejection fraction without structural changes- - NYHA class III - euvolemic today - weighing daily; reminded to call for an overnight weight gain of >2 pounds or a weekly weight gain of >5 pounds - not adding additional salt to her food but does eat at the cafeteria for her meals at [facility]; reviewed the sodium content of [place] bisc[place] and instructed her to look on the sardine can for sodium content; emphasized that she needed to stay around 2000mg  / day - advised to drink between 50-64 ounces of fluids daily - taking PT along with exercises in the pool - saw cardiology (Fath) 11/09/20 - BNP 10/06/20 was 179.0 - reports receiving her flu vaccine for this season along with both  covid vaccines and the booster   2: HTN- - BP looks good today - saw PCP Sabra Heck) 11/03/20 - BMP 11/03/20 reviewed and showed sodium 139, potassium 3.9, creatinine 0.8 and GFR 68  3: DM- - A1c 10/07/20 was 7.1% - nonfasting glucose in clinic today was 265   Patient did not bring her medications nor a list. Each medication was verbally reviewed with the patient and she was encouraged to bring the bottles to every visit to confirm accuracy of list.  Due to HF stability and multiple other appointments, patient opts to not make a return appointment at this time. Advised her that she could call back at anytime to schedule another appointment and she was comfortable with this plan.

## 2021-01-06 ENCOUNTER — Telehealth: Payer: Self-pay | Admitting: Family

## 2021-01-06 NOTE — Telephone Encounter (Signed)
Unable to reach patient regarding her missed appointment    Alison Richardson, NT

## 2021-05-12 ENCOUNTER — Other Ambulatory Visit: Payer: Self-pay

## 2021-05-12 ENCOUNTER — Encounter: Payer: Medicare PPO | Attending: Internal Medicine | Admitting: *Deleted

## 2021-05-12 ENCOUNTER — Encounter: Payer: Self-pay | Admitting: *Deleted

## 2021-05-12 VITALS — BP 112/74 | Ht 66.0 in | Wt 168.2 lb

## 2021-05-12 DIAGNOSIS — E119 Type 2 diabetes mellitus without complications: Secondary | ICD-10-CM | POA: Diagnosis not present

## 2021-05-12 NOTE — Progress Notes (Signed)
Diabetes Self-Management Education  Visit Type: First/Initial  Appt. Start Time: 1105 Appt. End Time: 1215  05/12/2021  [name], identified by name and date of birth, is a 87 y.o. female with a diagnosis of Diabetes: Type 2.   ASSESSMENT  Blood pressure 112/74, height 5\' 6"  (1.676 m), weight 168 lb 3.2 oz (76.3 kg). Body mass index is 27.15 kg/m.   Diabetes Self-Management Education - 05/12/21 1336      Visit Information   Visit Type First/Initial      Initial Visit   Diabetes Type Type 2    Are you currently following a meal plan? Yes    What type of meal plan do you follow? "decrease bread and desserts"    Are you taking your medications as prescribed? Yes    Date Diagnosed 6 yrs ago      Health Coping   How would you rate your overall health? Good;Fair      Psychosocial Assessment   Patient Belief/Attitude about Diabetes Other (comment)   "it doesn't worry me"   Self-care barriers None    Self-management support Doctor's office;Family    Patient Concerns Nutrition/Meal planning;Medication;Monitoring;Healthy Lifestyle;Problem Solving;Glycemic Control;Weight Control    Special Needs None    Preferred Learning Style Auditory;Visual;Hands on    [place] in progress    How often do you need to have someone help you when you read instructions, pamphlets, or other written materials from your doctor or pharmacy? 1 - Never    What is the last grade level you completed in school? Masters      Pre-Education Assessment   Patient understands the diabetes disease and treatment process. Needs Review    Patient understands incorporating nutritional management into lifestyle. Needs Instruction    Patient undertands incorporating physical activity into lifestyle. Needs Review    Patient understands using medications safely. Needs Review    Patient understands monitoring blood glucose, interpreting and using results Needs Review    Patient understands  prevention, detection, and treatment of acute complications. Needs Review    Patient understands prevention, detection, and treatment of chronic complications. Needs Review    Patient understands how to develop strategies to address psychosocial issues. Needs Instruction    Patient understands how to develop strategies to promote health/change behavior. Needs Instruction      Complications   Last HgB A1C per patient/outside source 6.6 %   04/04/2021   How often do you check your blood sugar? 1-2 times/day    Fasting Blood glucose range (mg/dL) 130-179;180-200   FBG's 135-180 mg/dL   Postprandial Blood glucose range (mg/dL) --   Bedtime readings 115-201 mg/dL.   Have you had a dilated eye exam in the past 12 months? Yes    Have you had a dental exam in the past 12 months? No    Are you checking your feet? No      Dietary Intake   Breakfast cereal and milk; toast and peanut butter    Snack (morning) 0-1 snacks/day - chips, crackers    Lunch eats "snack" - fruit (orange, apple, banana) or sandwich (peanut butter and onion, banana, pimento cheese)    Dinner eats at [employer] at [employer] - chicken, [place], fish, occasional beef and pork; potatoes, peas, corn, rice, pinto beans, baked beans, asparagus, broccoli, salad    Beverage(s) water, juice, milk, Coke occasionally, Ginger-ale when drinking liquor 3 x week, coffee, unsweetened tea      Exercise   Exercise  Type Light (walking / raking leaves)    How many days per week to you exercise? 3    How many minutes per day do you exercise? 60    Total minutes per week of exercise 180      Patient Education   Previous Diabetes Education No    Disease state  Definition of diabetes, type 1 and 2, and the diagnosis of diabetes;Explored patient's options for treatment of their diabetes    Nutrition management  Role of diet in the treatment of diabetes and the relationship between the three main macronutrients and blood glucose level;Food  label reading, portion sizes and measuring food.;Reviewed blood glucose goals for pre and post meals and how to evaluate the patients' food intake on their blood glucose level.    Physical activity and exercise  Role of exercise on diabetes management, blood pressure control and cardiac health.    Medications Reviewed patients medication for diabetes, action, purpose, timing of dose and side effects.    Monitoring Purpose and frequency of SMBG.;Taught/discussed recording of test results and interpretation of SMBG.;Identified appropriate SMBG and/or A1C goals.;Daily foot exams    Chronic complications Relationship between chronic complications and blood glucose control    Psychosocial adjustment Identified and addressed patients feelings and concerns about diabetes      Individualized Goals (developed by patient)   Reducing Risk Other (comment)   improve blood sugars, decrease medications, prevent diabetes complications, lose weight, lead a healthier lifestyle, become more fit     Outcomes   Expected Outcomes Demonstrated interest in learning. Expect positive outcomes    Future DMSE 2 wks           Individualized Plan for Diabetes Self-Management Training:   Learning Objective:  Patient will have a greater understanding of diabetes self-management. Patient education plan is to attend individual and/or group sessions per assessed needs and concerns.   Plan:   Patient Instructions  Check blood sugars 2 x day before breakfast and 2 hrs after supper every day Bring blood sugar records to the next appointment  Exercise: Continue walking for  60  minutes  3 days a week  Eat 3 meals day,  1  snack a day Space meals 4-6 hours apart Include 1 serving of protein with each meal and when eating fruit for a snack Avoid sugar sweetened drinks (soda, juices)  Complete 3 Day Food Record and bring to next appt  Return for appointment on:  Tuesday May 30, 2021 at 1:30 pm with Freda Munro  (nurse)  Expected Outcomes:  Demonstrated interest in learning. Expect positive outcomes  Education material provided:  General Meal Planning Guidelines Simple Meal Plan 3 Day Food Record  If problems or questions, patient to contact team via:  Johny Drilling, RN, [place] [phone]  Future DSME appointment: 2 wks  The patient is not interested in Diabetes class but agreed to attend the 2 Hour Refresher Program. Her next appointment is scheduled for May 30, 2021 with this nurse. She didn't want to follow-up with the dietitian at this time.

## 2021-05-12 NOTE — Patient Instructions (Signed)
Check blood sugars 2 x day before breakfast and 2 hrs after supper every day Bring blood sugar records to the next appointment  Exercise: Continue walking for  60  minutes  3 days a week  Eat 3 meals day,  1  snack a day Space meals 4-6 hours apart Include 1 serving of protein with each meal and when eating fruit for a snack Avoid sugar sweetened drinks (soda, juices)  Complete 3 Day Food Record and bring to next appt  Return for appointment on:  Tuesday May 30, 2021 at 1:30 pm with Freda Munro (nurse)

## 2021-05-14 IMAGING — DX DG CHEST 1V PORT
1 series · 1 of 1 positions shown · non-contrast
Comparison: 10/30/2019

CLINICAL DATA: Syncope

EXAM:
PORTABLE CHEST 1 VIEW

[chest ap]
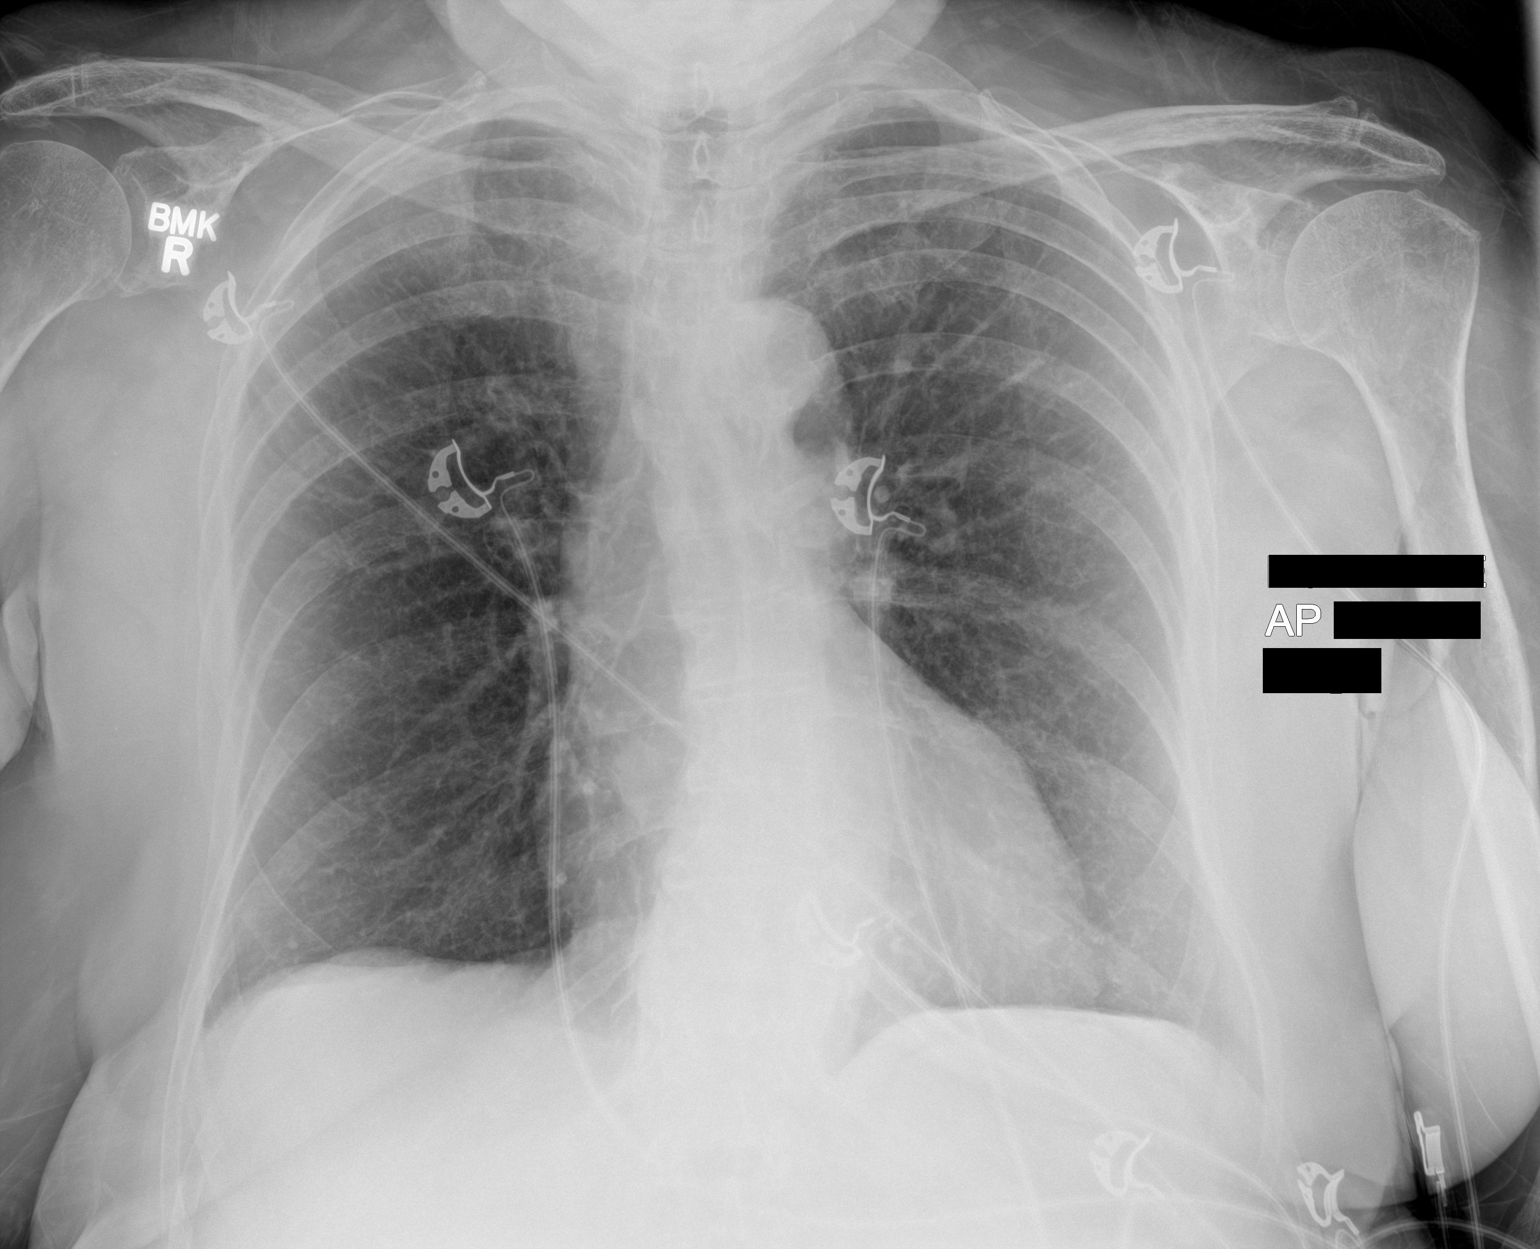

[1 of 1 positions shown; findings below may reference images not displayed]

FINDINGS: Normal heart size. Mild aortic tortuosity. There is no edema,
consolidation, effusion, or pneumothorax. Generalized osteopenia
with mild levoscoliosis.
IMPRESSION: No evidence of acute disease.

## 2021-05-14 IMAGING — CT CT HEAD W/O CM
3 series · 15 of 47 positions shown, 18 images · non-contrast
Comparison: Head CT 09/17/2018

CLINICAL DATA: Syncope with head injury. Headache and eyebrow
laceration

EXAM:
CT HEAD WITHOUT CONTRAST
CT MAXILLOFACIAL WITHOUT CONTRAST
CT CERVICAL SPINE WITHOUT CONTRAST
TECHNIQUE: Multidetector CT imaging of the head, cervical spine, and
maxillofacial structures were performed using the standard protocol
without intravenous contrast. Multiplanar CT image reconstructions
of the cervical spine and maxillofacial structures were also
generated.

[Series 2: head wo · axial · 0.43mm/px · z∈[-138,-13]mm · 9 of 31 slices shown, 12 images]
[im 3/31  brain]
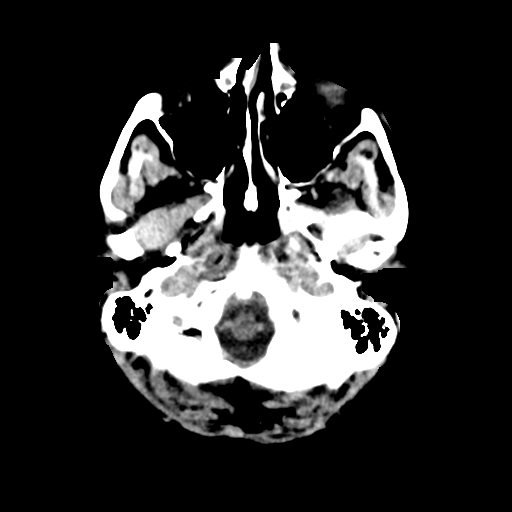
[im 3/31  bone]
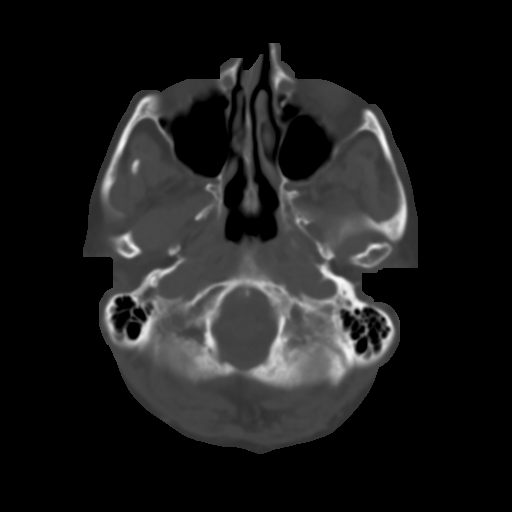
[im 6/31  brain]
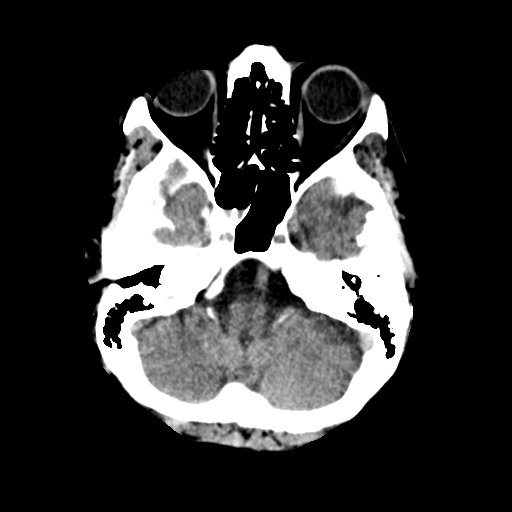
[im 9/31  brain]
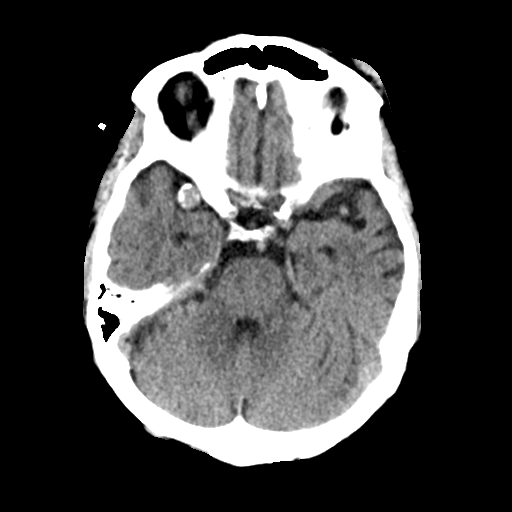
[im 12/31  brain]
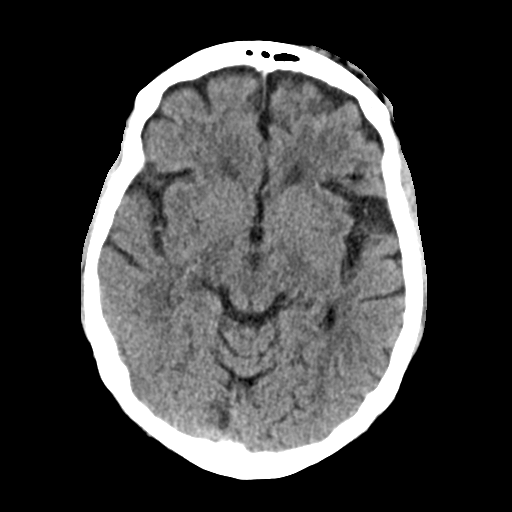
[im 16/31  brain]
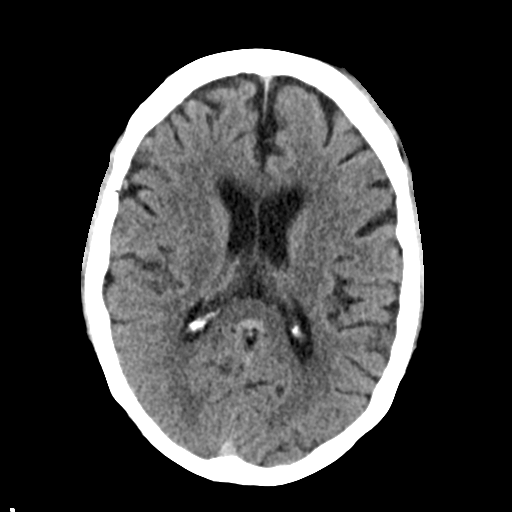
[im 16/31  bone]
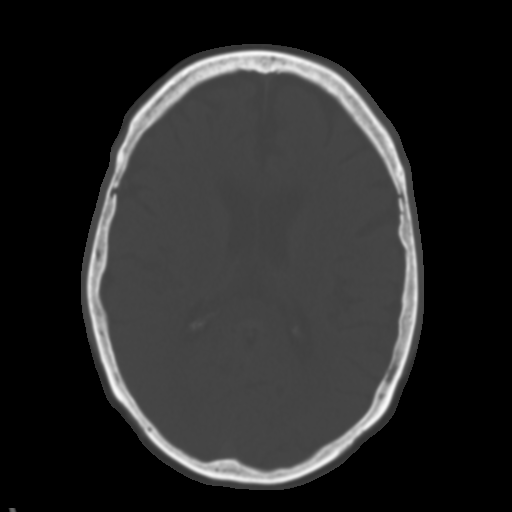
[im 19/31  brain]
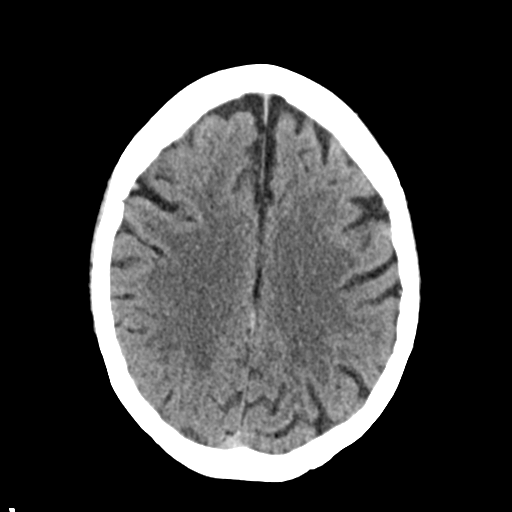
[im 22/31  brain]
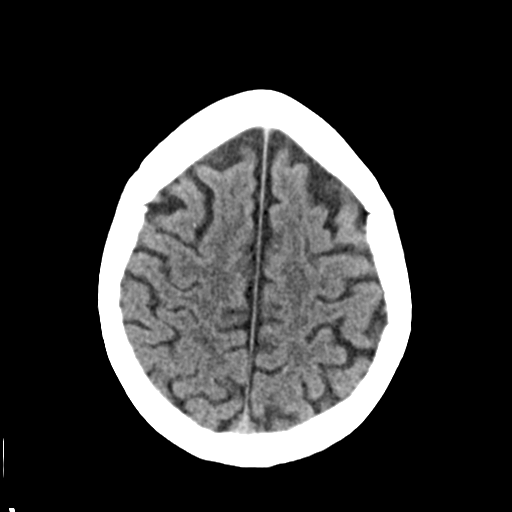
[im 25/31  brain]
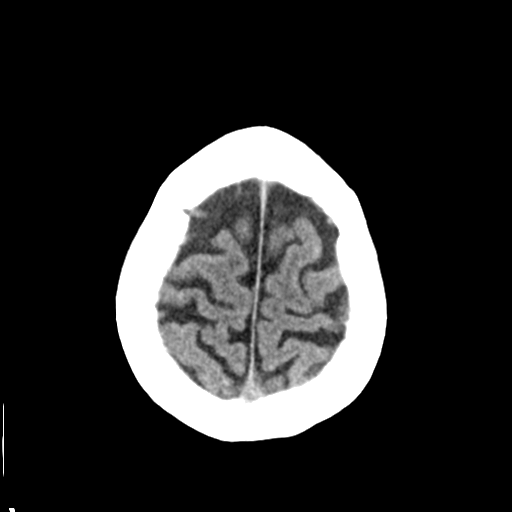
[im 28/31  brain]
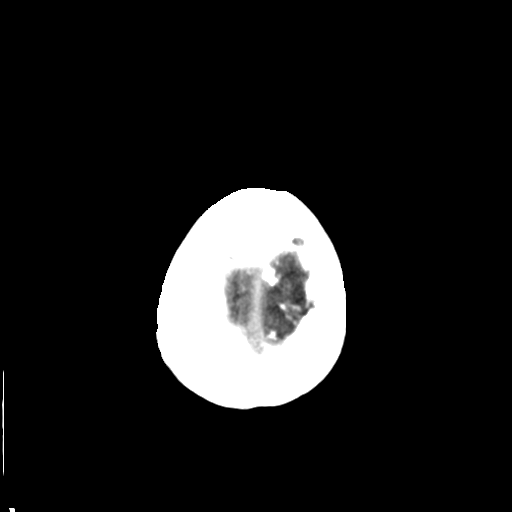
[im 28/31  bone]
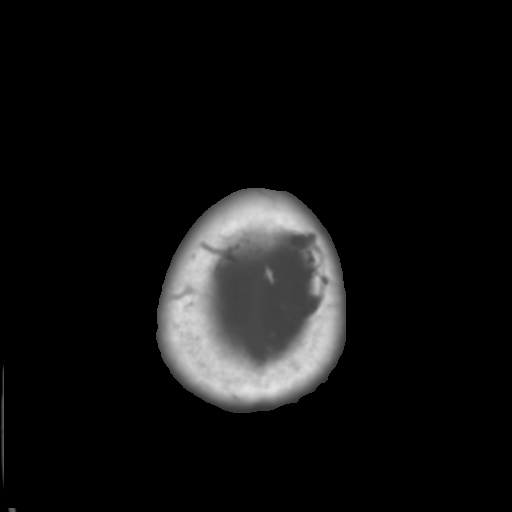

[Series 4: coronal soft tissue · coronal · 0.31mm/px · 3 of 62 slices shown]
[im 21/62  brain]
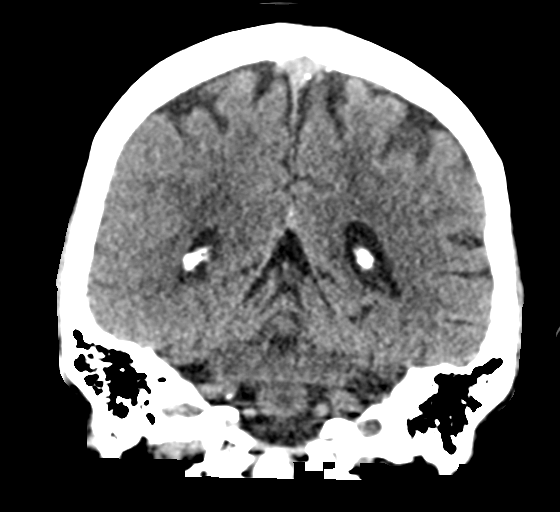
[im 28/62  brain]
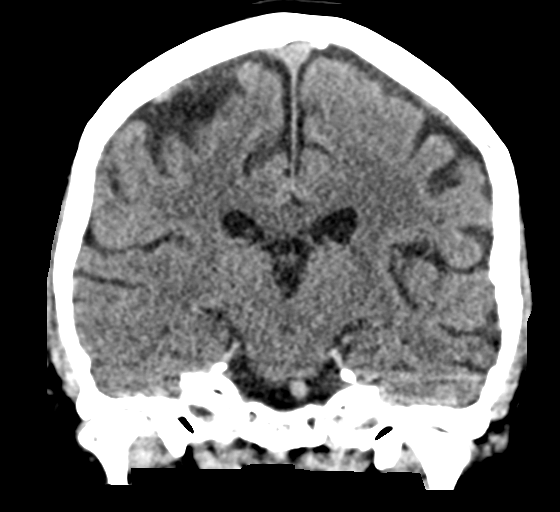
[im 34/62  brain]
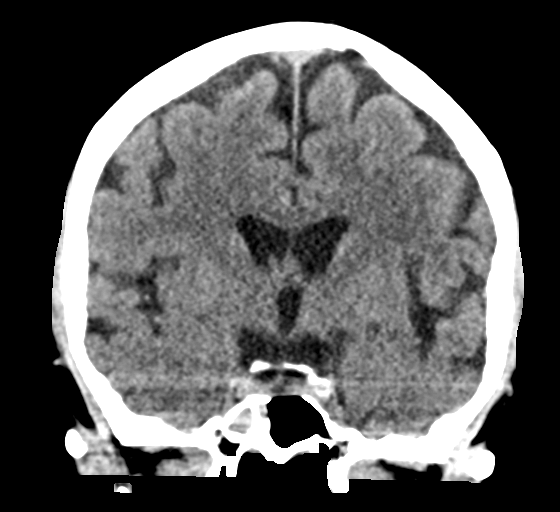

[Series 5: sagittal soft tissue · sagittal · 0.32mm/px · 3 of 50 slices shown]
[im 17/50  brain]
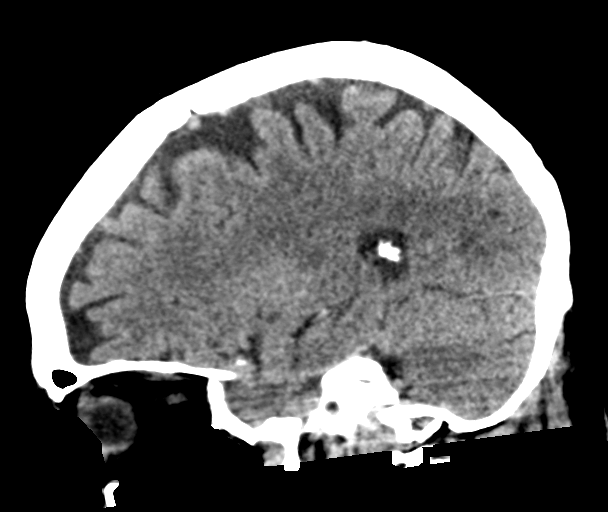
[im 25/50  brain]
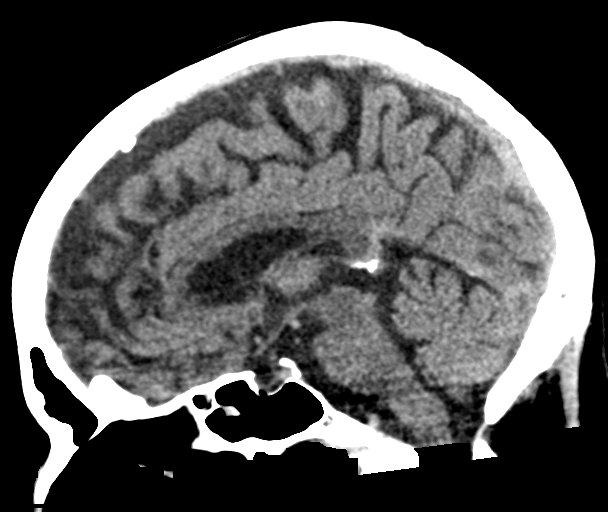
[im 33/50  brain]
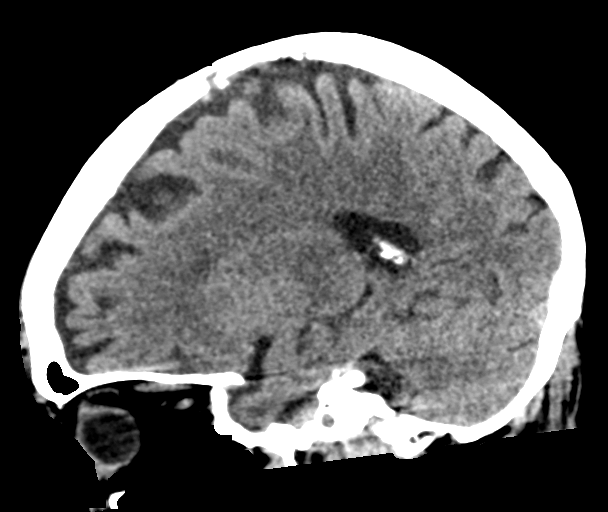

[15 of 47 positions shown; findings below may reference images not displayed]

FINDINGS: CT HEAD FINDINGS

Brain: Small area of subarachnoid hemorrhage along the left frontal
convexity, most convincing on coronal and sagittal reformats.

7 mm high-density nodule along the high left frontal convexity,
stable from prior.

No infarct, hydrocephalus, or shift.

Vascular: Peripherally calcified mass along the lower right sylvian
fissure measuring 1.1 cm, most consistent with MCA aneurysm based on
reformats.

Skull: Negative for calvarial fracture.  Left forehead hematoma

Other: Critical Value/emergent results were called by telephone at
the time of interpretation on 09/29/2019 at [DATE] to Dleke
HEMETSBERGER , who verbally acknowledged these results.

CT MAXILLOFACIAL FINDINGS

Osseous: Negative for fracture or mandibular dislocation

Orbits: No postseptal injury.  Bilateral cataract resection

Sinuses: Small high-density fluid level in the right sphenoid sinus
without visible underlying fracture.

Soft tissues: Hematoma above and lateral to the left orbit with gas
from laceration. No opaque foreign body

CT CERVICAL SPINE FINDINGS

Alignment: No traumatic malalignment

Skull base and vertebrae: Negative for fracture

Soft tissues and spinal canal: No prevertebral fluid or swelling. No
visible canal hematoma.

Disc levels:  Generalized degenerative disc narrowing and ridging.

Upper chest: Negative
IMPRESSION: 1. Small left frontal subarachnoid hemorrhage.
2. Left periorbital contusion and laceration without calvarial
fracture.
3. Negative for cervical spine fracture.
4. Known ~ 1.1 cm right sylvian fissure mass compatible with MCA
aneurysm. Subcentimeter left frontal meningioma.

## 2021-05-14 IMAGING — CT CT CERVICAL SPINE W/O CM
3 of 4 series · 11 of 33 positions shown, 13 images · non-contrast
Comparison: Head CT 09/17/2018

CLINICAL DATA: Syncope with head injury. Headache and eyebrow
laceration

EXAM:
CT HEAD WITHOUT CONTRAST
CT MAXILLOFACIAL WITHOUT CONTRAST
CT CERVICAL SPINE WITHOUT CONTRAST
TECHNIQUE: Multidetector CT imaging of the head, cervical spine, and
maxillofacial structures were performed using the standard protocol
without intravenous contrast. Multiplanar CT image reconstructions
of the cervical spine and maxillofacial structures were also
generated.

[Series 4: sagittal bone · sagittal · 0.24mm/px · 5 of 59 slices shown, 6 images]
[im 20/59  bone]
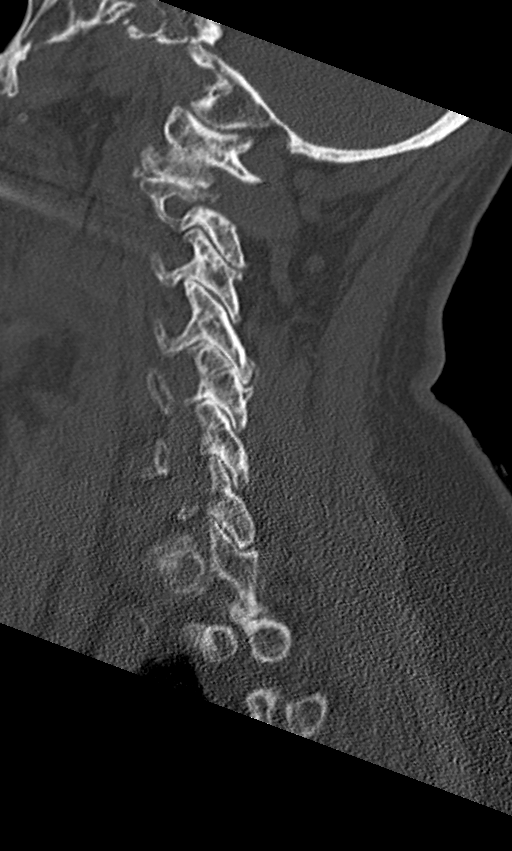
[im 25/59  bone]
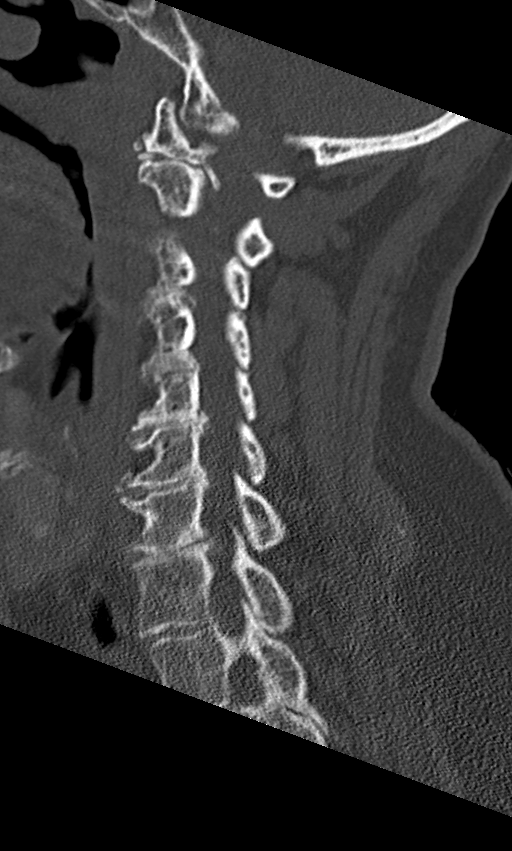
[im 30/59  soft-tissue]
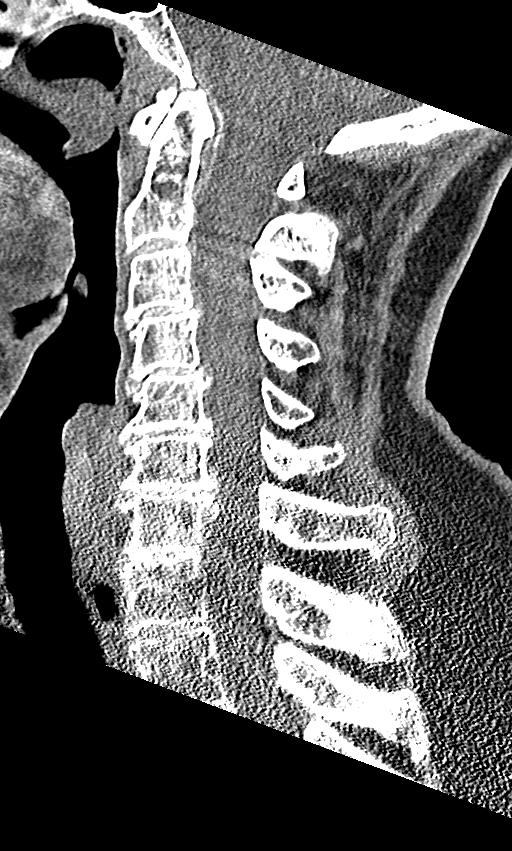
[im 30/59  bone]
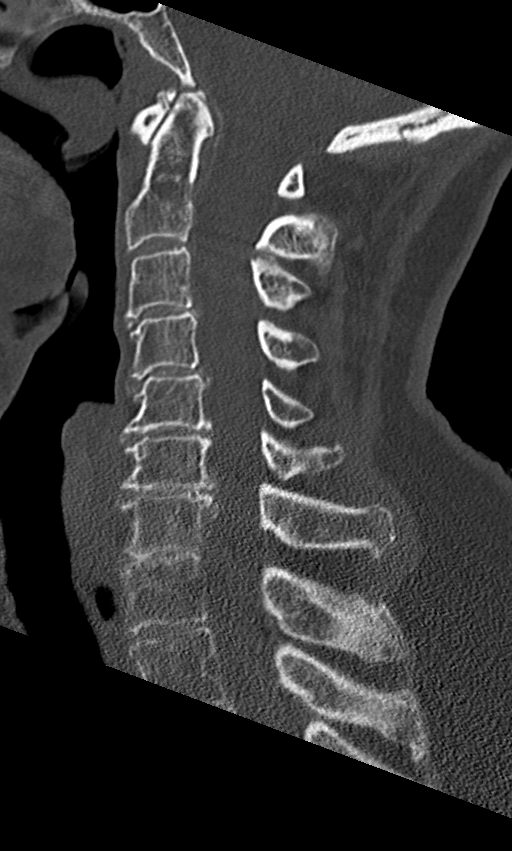
[im 34/59  bone]
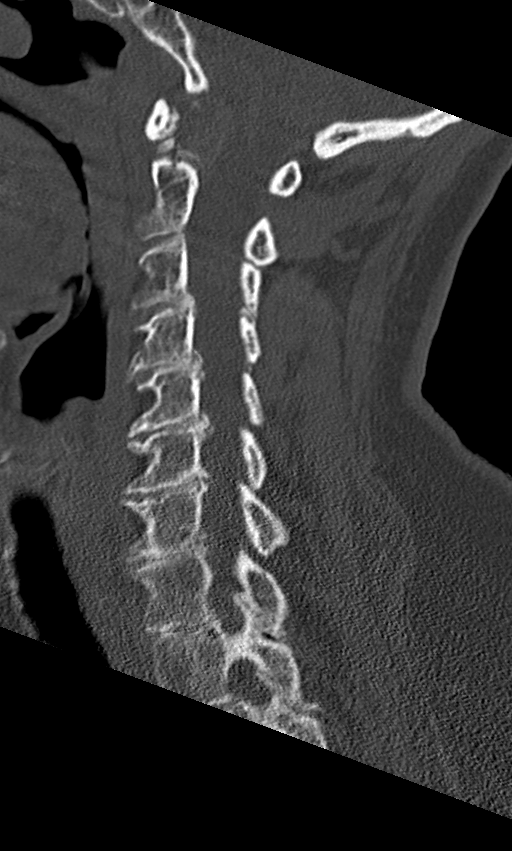
[im 39/59  bone]
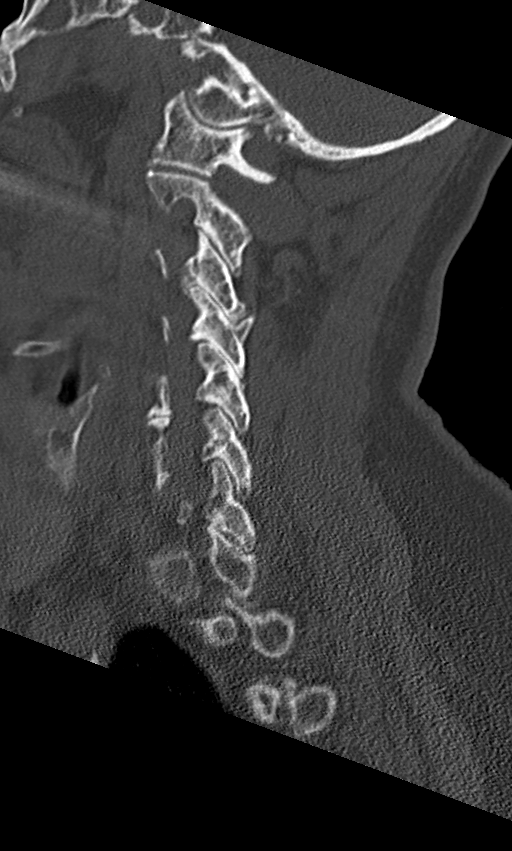

[Series 5: coronal bone · coronal · 0.21mm/px · 3 of 59 slices shown]
[im 12/59  bone]
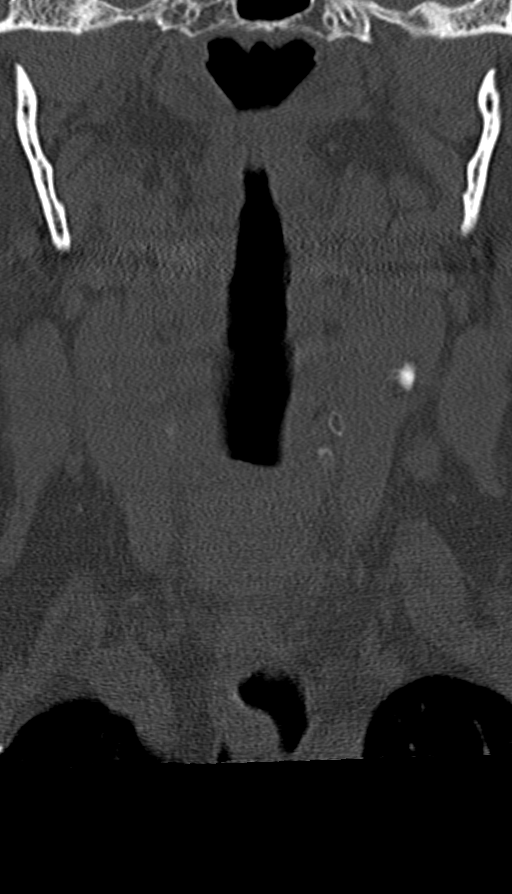
[im 24/59  bone]
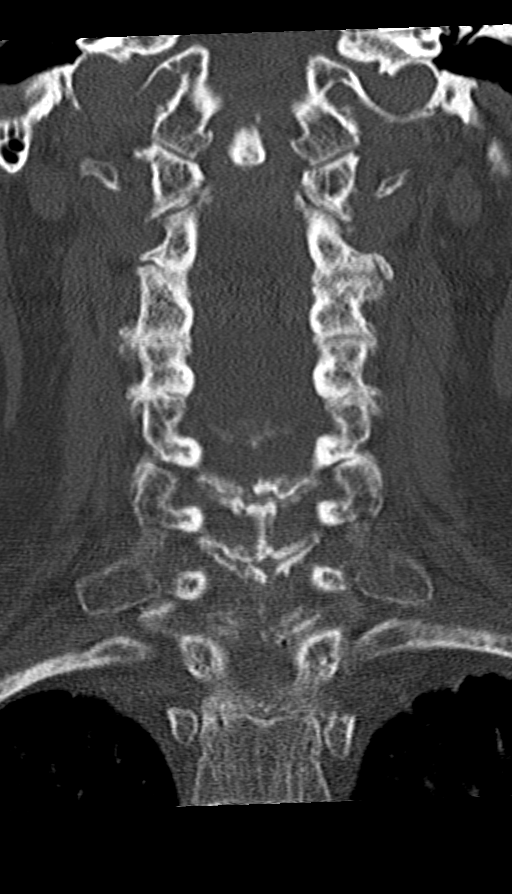
[im 35/59  bone]
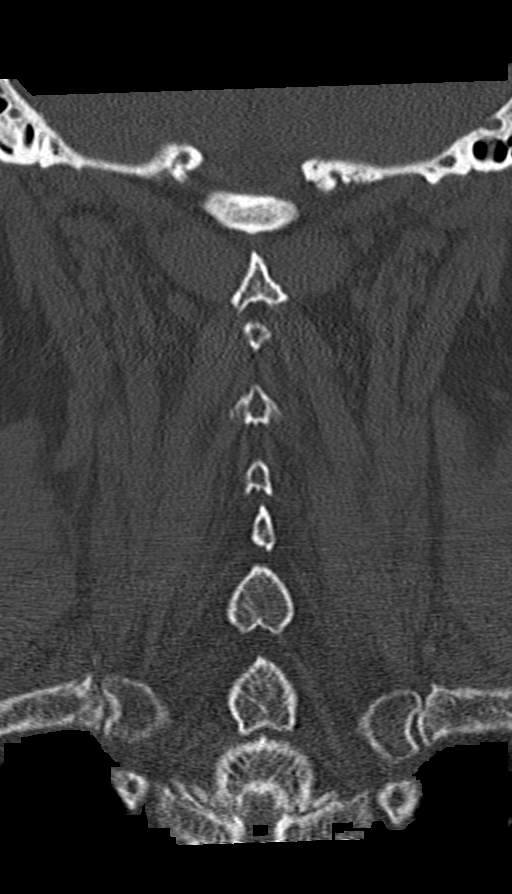

[Series 6: orthogonal axials · axial · 0.21mm/px · z∈[-277,-174]mm · 3 of 97 slices shown, 4 images]
[im 28/97  soft-tissue]
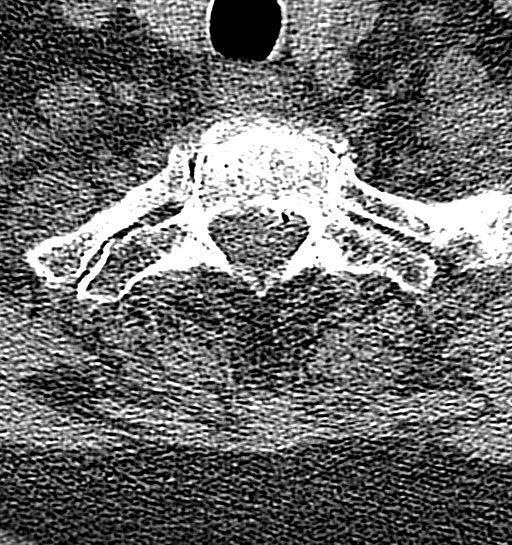
[im 28/97  bone]
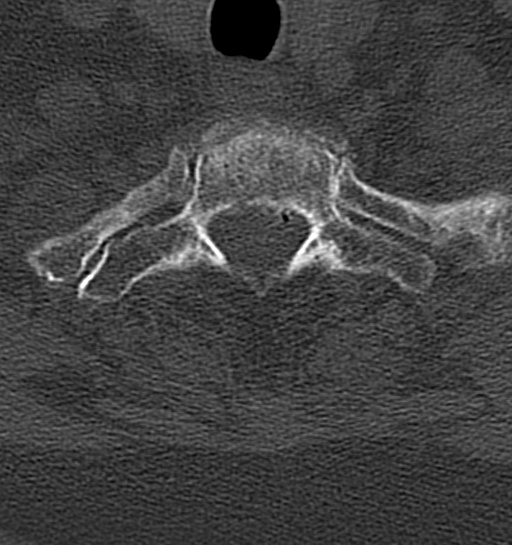
[im 55/97  bone]
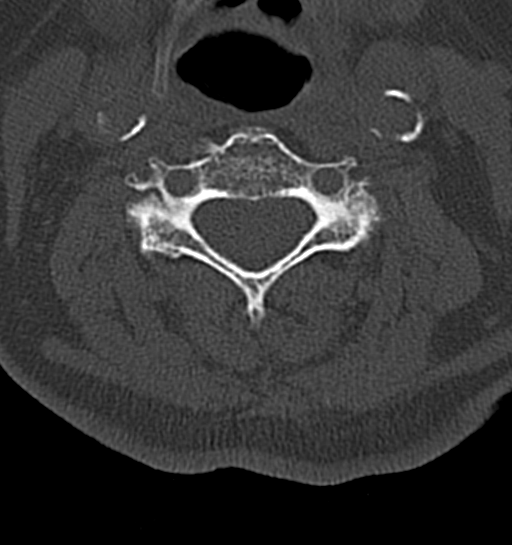
[im 83/97  bone]
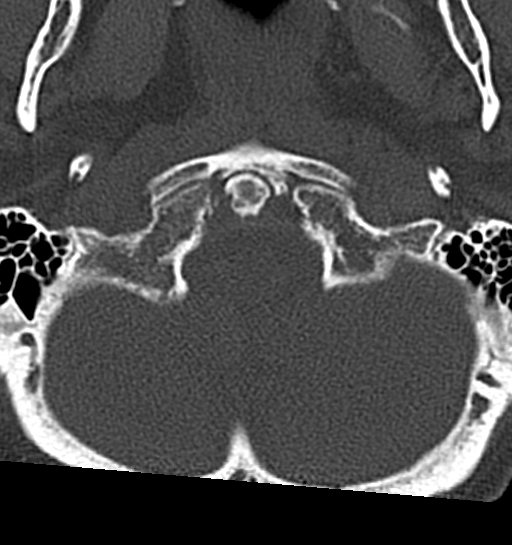

[11 of 33 positions shown; findings below may reference images not displayed]

FINDINGS: CT HEAD FINDINGS

Brain: Small area of subarachnoid hemorrhage along the left frontal
convexity, most convincing on coronal and sagittal reformats.

7 mm high-density nodule along the high left frontal convexity,
stable from prior.

No infarct, hydrocephalus, or shift.

Vascular: Peripherally calcified mass along the lower right sylvian
fissure measuring 1.1 cm, most consistent with MCA aneurysm based on
reformats.

Skull: Negative for calvarial fracture.  Left forehead hematoma

Other: Critical Value/emergent results were called by telephone at
the time of interpretation on 09/29/2019 at [DATE] to Dleke
HEMETSBERGER , who verbally acknowledged these results.

CT MAXILLOFACIAL FINDINGS

Osseous: Negative for fracture or mandibular dislocation

Orbits: No postseptal injury.  Bilateral cataract resection

Sinuses: Small high-density fluid level in the right sphenoid sinus
without visible underlying fracture.

Soft tissues: Hematoma above and lateral to the left orbit with gas
from laceration. No opaque foreign body

CT CERVICAL SPINE FINDINGS

Alignment: No traumatic malalignment

Skull base and vertebrae: Negative for fracture

Soft tissues and spinal canal: No prevertebral fluid or swelling. No
visible canal hematoma.

Disc levels:  Generalized degenerative disc narrowing and ridging.

Upper chest: Negative
IMPRESSION: 1. Small left frontal subarachnoid hemorrhage.
2. Left periorbital contusion and laceration without calvarial
fracture.
3. Negative for cervical spine fracture.
4. Known ~ 1.1 cm right sylvian fissure mass compatible with MCA
aneurysm. Subcentimeter left frontal meningioma.

## 2021-05-30 ENCOUNTER — Encounter: Payer: Medicare PPO | Attending: Internal Medicine | Admitting: *Deleted

## 2021-05-30 ENCOUNTER — Other Ambulatory Visit: Payer: Self-pay

## 2021-05-30 DIAGNOSIS — E119 Type 2 diabetes mellitus without complications: Secondary | ICD-10-CM | POA: Insufficient documentation

## 2021-05-31 ENCOUNTER — Encounter: Payer: Self-pay | Admitting: *Deleted

## 2021-05-31 NOTE — Progress Notes (Signed)
Patient didn't show for her Diabetes appointment yesterday. Left her a message to call back and reschedule.

## 2021-06-22 ENCOUNTER — Encounter: Payer: Self-pay | Admitting: *Deleted

## 2021-08-09 ENCOUNTER — Ambulatory Visit: Payer: Medicare PPO | Admitting: Podiatry

## 2021-08-09 ENCOUNTER — Encounter: Payer: Self-pay | Admitting: Podiatry

## 2021-08-09 ENCOUNTER — Other Ambulatory Visit: Payer: Self-pay

## 2021-08-09 ENCOUNTER — Ambulatory Visit: Payer: Medicare PPO

## 2021-08-09 DIAGNOSIS — L603 Nail dystrophy: Secondary | ICD-10-CM

## 2021-08-09 NOTE — Progress Notes (Signed)
She presented today with primary concern of her nail disorder.  Objective: Nail dystrophy hallux.  Assessment: Nail dystrophy.  Plan:  Skin and nail were sent today for pathologic evaluation.

## 2021-08-22 ENCOUNTER — Encounter: Payer: Self-pay | Admitting: Podiatry

## 2021-09-18 ENCOUNTER — Other Ambulatory Visit: Payer: Self-pay

## 2021-09-18 ENCOUNTER — Ambulatory Visit: Payer: Medicare PPO | Admitting: Podiatry

## 2021-09-18 ENCOUNTER — Encounter: Payer: Self-pay | Admitting: Podiatry

## 2021-09-18 DIAGNOSIS — M7751 Other enthesopathy of right foot: Secondary | ICD-10-CM | POA: Diagnosis not present

## 2021-09-18 DIAGNOSIS — L603 Nail dystrophy: Secondary | ICD-10-CM | POA: Diagnosis not present

## 2021-09-18 MED ORDER — CICLOPIROX 8 % EX SOLN: Freq: Every day | CUTANEOUS | 11 refills | Status: DC

## 2021-09-18 MED ORDER — TRIAMCINOLONE ACETONIDE 40 MG/ML IJ SUSP
20.0000 mg | Freq: Once | INTRAMUSCULAR | Status: AC
  Administered 2021-09-18: 20 mg

## 2021-09-18 NOTE — Progress Notes (Signed)
She presents today for follow-up of her right foot pain as well as pathology regarding her toenails.  Objective: Vital signs are stable she is alert and oriented x3 still has moderate erythema and edema to the lateral foot right.  The majority of her pain is located in the sinus tarsi with pain on end range of motion of the subtalar joint.  Consistent with capsulitis of the subtalar joint.  Nail pathology does demonstrate dermatophytes.  Assessment: I capsulitis subtalar joint right foot.  Onychomycosis.  Plan: Injected subtalar joint right 20 mg Kenalog 5 mg Marcaine point of maximal tenderness.  Tolerated procedure well.  Discussed pros and cons of oral therapy topical therapy laser therapy she would like to use topical therapy at this point so we wrote a prescription for Penlac.  Also wrote a prescription for blood work to be done for rheumatic panel and I will follow-up with her once that comes in.  Otherwise I will see her in a month

## 2021-09-19 ENCOUNTER — Encounter: Payer: Self-pay | Admitting: Nurse Practitioner

## 2021-09-19 LAB — CBC WITH DIFFERENTIAL/PLATELET
Basophils Absolute: 0.1 10*3/uL (ref 0.0–0.2)
Basos: 1 %
EOS (ABSOLUTE): 0.2 10*3/uL (ref 0.0–0.4)
Eos: 2 %
Hematocrit: 43 % (ref 34.0–46.6)
Hemoglobin: 14.3 g/dL (ref 11.1–15.9)
Immature Grans (Abs): 0 10*3/uL (ref 0.0–0.1)
Immature Granulocytes: 0 %
Lymphocytes Absolute: 2.3 10*3/uL (ref 0.7–3.1)
Lymphs: 23 %
MCH: 30.6 pg (ref 26.6–33.0)
MCHC: 33.3 g/dL (ref 31.5–35.7)
MCV: 92 fL (ref 79–97)
Monocytes Absolute: 0.8 10*3/uL (ref 0.1–0.9)
Monocytes: 7 %
Neutrophils Absolute: 6.8 10*3/uL (ref 1.4–7.0)
Neutrophils: 67 %
Platelets: 291 10*3/uL (ref 150–450)
RBC: 4.67 x10E6/uL (ref 3.77–5.28)
RDW: 13.7 % (ref 11.7–15.4)
WBC: 10.3 10*3/uL (ref 3.4–10.8)

## 2021-09-19 LAB — ANA: Anti Nuclear Antibody (ANA): NEGATIVE

## 2021-09-19 LAB — HM DIABETES EYE EXAM

## 2021-09-19 LAB — SEDIMENTATION RATE: Sed Rate: 4 mm/hr (ref 0–40)

## 2021-09-19 LAB — RHEUMATOID FACTOR: Rheumatoid fact SerPl-aCnc: 10.3 IU/mL (ref <14.0)

## 2021-09-19 LAB — C-REACTIVE PROTEIN: CRP: 7 mg/L (ref 0–10)

## 2021-09-19 LAB — URIC ACID: Uric Acid: 4.1 mg/dL (ref 3.1–7.9)

## 2021-09-27 ENCOUNTER — Ambulatory Visit: Payer: Medicare PPO | Admitting: Nurse Practitioner

## 2021-09-27 NOTE — Progress Notes (Deleted)
There were no vitals taken for this visit.   Subjective:    Patient ID: Alison Richardson, female    DOB: 1933-10-28, 87 y.o.   MRN: [id number]  HPI: Alison Richardson is a 87 y.o. female  No chief complaint on file.  Patient presents to clinic to establish care with new PCP.  Patient reports a history of ***. Patient denies a history of: Hypertension, Elevated Cholesterol, Diabetes, Thyroid problems, Depression, Anxiety, Neurological problems, and Abdominal problems.   Active Ambulatory Problems    Diagnosis Date Noted   Anxiety disorder 06/19/2018   Depression, major, single episode, mild (HCC) 06/19/2018   Paroxysmal atrial fibrillation ([place]) 06/19/2018   Atrial flutter ([place]) 06/19/2018   Pure hypercholesterolemia 06/19/2018   Vitamin B12 deficiency 06/19/2018   Insomnia 06/19/2018   Protein-calorie malnutrition ([place]) 06/19/2018   Type 2 diabetes mellitus with diabetic neuropathy ([place]) 06/19/2018   Knee pain 06/19/2018   Cognitive impairment, mild, so stated 06/19/2018   Osteoarthritis of right knee 06/19/2018   Benign essential hypertension 06/19/2018   Hypothyroidism 06/19/2018   Overactive bladder 06/19/2018   Generalized muscle weakness 06/19/2018   Difficulty in walking, not elsewhere classified 06/19/2018   Cerebral aneurysm 04/29/2013   Diabetes mellitus type 2 in nonobese ([place]) 01/21/2019   DM type 2 with diabetic mixed hyperlipidemia ([place]) 09/08/2018   Dyslipidemia 01/21/2019   Major depression in remission ([place]) 06/18/2018   Mixed hyperlipidemia 11/01/2015   PAD (peripheral artery disease) ([place]) 01/06/2019   Peripheral neuropathy, idiopathic 09/08/2018   Preop testing 01/06/2019   Primary osteoarthritis involving multiple joints 06/18/2018   PSVT (paroxysmal supraventricular tachycardia) ([place]) 01/21/2019   Tenosynovitis of foot 01/21/2019   Syncope 09/29/2019   Syncope and collapse 09/30/2019   DOE (dyspnea on exertion) 05/09/2020   Acute CHF (congestive heart  failure) ([place]) 10/07/2020   Elevated troponin 10/07/2020   CHF (congestive heart failure) ([place]) 10/07/2020   Leukocytosis 10/07/2020   Acute on chronic diastolic CHF (congestive heart failure), NYHA class 3 ([place]) 10/13/2020   Cor pulmonale, chronic ([place]) 09/27/2019   Late onset Alzheimer's disease without behavioral disturbance ([place]) 09/23/2019   Obstructive sleep apnea 07/09/2019   Resolved Ambulatory Problems    Diagnosis Date Noted   No Resolved Ambulatory Problems   Past Medical History:  Diagnosis Date   Arrhythmia    Basal cell carcinoma of back    Basal cell carcinoma of lip    DDD (degenerative disc disease), lumbar    Diabetes mellitus type II, controlled ([place])    Diverticulosis    Dysrhythmia    Dysthymia    GERD (gastroesophageal reflux disease)    History of meniscal tear    Hyperlipidemia    Hypertension    Late onset Alzheimer's disease with behavioral disturbance ([place])    Mild pulmonary hypertension ([place])    Peripheral vascular disease ([place])    Seasonal allergic rhinitis    Sleep apnea    Past Surgical History:  Procedure Laterality Date   APPENDECTOMY  1946   BASAL CELL CARCINOMA EXCISION     2006 and 2009 removed from back and lip   CARDIOVASCULAR STRESS TEST  2015   nuclear cardiac stress test negative for ischemia - Dr. Satira Sark   CATARACT EXTRACTION, BILATERAL  2006   COLONOSCOPY  2014   COLONOSCOPY N/A 08/19/2020   Procedure: COLONOSCOPY;  Surgeon: Lesly Rubenstein, MD;  Location: [facility] ENDOSCOPY;  Service: Endoscopy;  Laterality: N/A;   ECTOPIC PREGNANCY SURGERY  1957   ESOPHAGOGASTRODUODENOSCOPY N/A  08/19/2020   Procedure: ESOPHAGOGASTRODUODENOSCOPY (EGD);  Surgeon: Lesly Rubenstein, MD;  Location: [facility] ENDOSCOPY;  Service: Endoscopy;  Laterality: N/A;   INJECTION KNEE Left 05/2015   REPLACEMENT TOTAL KNEE Left 09/06/2016   Dr. Barnet Pall   TONSILLECTOMY AND ADENOIDECTOMY  1953   TOTAL ABDOMINAL HYSTERECTOMY  1986   DU B   Family History   Problem Relation Age of Onset   Hypertension Mother    CAD Father    Diabetes Sister    Diabetes Paternal Uncle       Review of Systems  Per HPI unless specifically indicated above     Objective:    There were no vitals taken for this visit.  Wt Readings from Last 3 Encounters:  05/12/21 168 lb 3.2 oz (76.3 kg)  11/23/20 168 lb 8 oz (76.4 kg)  10/06/20 165 lb (74.8 kg)    Physical Exam  Results for orders placed or performed in visit on 09/18/21  ANA  Result Value Ref Range   Anti Nuclear Antibody (ANA) Negative Negative  C-reactive protein  Result Value Ref Range   CRP 7 0 - 10 mg/L  Rheumatoid factor  Result Value Ref Range   Rhuematoid fact SerPl-aCnc 10.3 <14.0 IU/mL  Sedimentation rate  Result Value Ref Range   Sed Rate 4 0 - 40 mm/hr  Uric acid  Result Value Ref Range   Uric Acid 4.1 3.1 - 7.9 mg/dL  CBC with Differential/Platelet  Result Value Ref Range   WBC 10.3 3.4 - 10.8 x10E3/uL   RBC 4.67 3.77 - 5.28 x10E6/uL   Hemoglobin 14.3 11.1 - 15.9 g/dL   Hematocrit 43.0 34.0 - 46.6 %   MCV 92 79 - 97 fL   MCH 30.6 26.6 - 33.0 pg   MCHC 33.3 31.5 - 35.7 g/dL   RDW 13.7 11.7 - 15.4 %   Platelets 291 150 - 450 x10E3/uL   Neutrophils 67 Not Estab. %   Lymphs 23 Not Estab. %   Monocytes 7 Not Estab. %   Eos 2 Not Estab. %   Basos 1 Not Estab. %   Neutrophils Absolute 6.8 1.4 - 7.0 x10E3/uL   Lymphocytes Absolute 2.3 0.7 - 3.1 x10E3/uL   Monocytes Absolute 0.8 0.1 - 0.9 x10E3/uL   EOS (ABSOLUTE) 0.2 0.0 - 0.4 x10E3/uL   Basophils Absolute 0.1 0.0 - 0.2 x10E3/uL   Immature Granulocytes 0 Not Estab. %   Immature Grans (Abs) 0.0 0.0 - 0.1 x10E3/uL      Assessment & Plan:   Problem List Items Addressed This Visit   None Visit Diagnoses     Encounter to establish care    -  Primary        Follow up plan: No follow-ups on file.

## 2021-09-28 ENCOUNTER — Telehealth: Payer: Self-pay | Admitting: Podiatry

## 2021-09-28 NOTE — Telephone Encounter (Signed)
Patient called stating she had labwork done and she would like the results. Pt's phone number is [phone].

## 2021-10-09 ENCOUNTER — Emergency Department
Admission: EM | Admit: 2021-10-09 | Discharge: 2021-10-09 | Disposition: A | Discharge status: Home / Self Care | Payer: Medicare PPO | Source: Home / Self Care | Attending: Emergency Medicine | Admitting: Emergency Medicine

## 2021-10-09 ENCOUNTER — Emergency Department: Payer: Medicare PPO

## 2021-10-09 ENCOUNTER — Other Ambulatory Visit: Payer: Self-pay

## 2021-10-09 DIAGNOSIS — I214 Non-ST elevation (NSTEMI) myocardial infarction: Secondary | ICD-10-CM | POA: Diagnosis not present

## 2021-10-09 DIAGNOSIS — R0789 Other chest pain: Secondary | ICD-10-CM | POA: Insufficient documentation

## 2021-10-09 DIAGNOSIS — Z5321 Procedure and treatment not carried out due to patient leaving prior to being seen by health care provider: Secondary | ICD-10-CM | POA: Insufficient documentation

## 2021-10-09 DIAGNOSIS — I4891 Unspecified atrial fibrillation: Secondary | ICD-10-CM | POA: Insufficient documentation

## 2021-10-09 DIAGNOSIS — R7989 Other specified abnormal findings of blood chemistry: Secondary | ICD-10-CM | POA: Insufficient documentation

## 2021-10-09 DIAGNOSIS — I10 Essential (primary) hypertension: Secondary | ICD-10-CM | POA: Insufficient documentation

## 2021-10-09 LAB — COMPREHENSIVE METABOLIC PANEL
ALT: 25 U/L (ref 0–44)
AST: 26 U/L (ref 15–41)
Albumin: 4.2 g/dL (ref 3.5–5.0)
Alkaline Phosphatase: 88 U/L (ref 38–126)
Anion gap: 14 (ref 5–15)
BUN: 16 mg/dL (ref 8–23)
CO2: 20 mmol/L — ABNORMAL LOW (ref 22–32)
Calcium: 9 mg/dL (ref 8.9–10.3)
Chloride: 105 mmol/L (ref 98–111)
Creatinine, Ser: UNDETERMINED mg/dL (ref 0.44–1.00)
Glucose, Bld: 161 mg/dL — ABNORMAL HIGH (ref 70–99)
Potassium: 4 mmol/L (ref 3.5–5.1)
Sodium: 139 mmol/L (ref 135–145)
Total Bilirubin: 0.8 mg/dL (ref 0.3–1.2)
Total Protein: 7.6 g/dL (ref 6.5–8.1)

## 2021-10-09 LAB — CBC WITH DIFFERENTIAL/PLATELET
Abs Immature Granulocytes: 0.05 10*3/uL (ref 0.00–0.07)
Basophils Absolute: 0.1 10*3/uL (ref 0.0–0.1)
Basophils Relative: 1 %
Eosinophils Absolute: 0.1 10*3/uL (ref 0.0–0.5)
Eosinophils Relative: 1 %
HCT: 45.4 % (ref 36.0–46.0)
Hemoglobin: 15.9 g/dL — ABNORMAL HIGH (ref 12.0–15.0)
Immature Granulocytes: 1 %
Lymphocytes Relative: 20 %
Lymphs Abs: 2.1 10*3/uL (ref 0.7–4.0)
MCH: 32.2 pg (ref 26.0–34.0)
MCHC: 35 g/dL (ref 30.0–36.0)
MCV: 91.9 fL (ref 80.0–100.0)
Monocytes Absolute: 0.9 10*3/uL (ref 0.1–1.0)
Monocytes Relative: 8 %
Neutro Abs: 7 10*3/uL (ref 1.7–7.7)
Neutrophils Relative %: 69 %
Platelets: 258 10*3/uL (ref 150–400)
RBC: 4.94 MIL/uL (ref 3.87–5.11)
RDW: 14.4 % (ref 11.5–15.5)
WBC: 10.1 10*3/uL (ref 4.0–10.5)
nRBC: 0 % (ref 0.0–0.2)

## 2021-10-09 LAB — URINALYSIS, COMPLETE (UACMP) WITH MICROSCOPIC
Bacteria, UA: NONE SEEN
Bilirubin Urine: NEGATIVE
Glucose, UA: 150 mg/dL — AB
Ketones, ur: 5 mg/dL — AB
Nitrite: NEGATIVE
Protein, ur: 100 mg/dL — AB
Specific Gravity, Urine: 1.021 (ref 1.005–1.030)
pH: 5 (ref 5.0–8.0)

## 2021-10-09 LAB — CREATININE, SERUM
Creatinine, Ser: 0.84 mg/dL (ref 0.44–1.00)
GFR, Estimated: >60 mL/min (ref >60)

## 2021-10-09 LAB — LIPASE, BLOOD: Lipase: 24 U/L (ref 11–51)

## 2021-10-09 LAB — TROPONIN I (HIGH SENSITIVITY): Troponin I (High Sensitivity): 35 ng/L — ABNORMAL HIGH (ref <18)

## 2021-10-09 NOTE — ED Notes (Signed)
[place], blue, light green tubes & urine specimen sent to lab.

## 2021-10-09 NOTE — ED Triage Notes (Signed)
Pt BIB EMS from home for Cpw/radiation to jaw, onset approx 1 hr ago

## 2021-10-09 NOTE — ED Provider Notes (Signed)
Emergency Medicine Provider Triage Evaluation Note  Alison Richardson , a 87 y.o. female  was evaluated in triage.  Pt complains of chest pain that goes "all the way across" sharp and radiating to the jaw that started 2 hours ago.  Patient reports a history of atrial fibrillation and hypertension but no other cardiac issues.  She denies chest pain and chest tightness.  States that she has had some nausea at home but no vomiting.  Patient states that she has felt clammy and sweaty at home since symptoms have started.  Denies experiencing similar symptoms in the past.  Review of Systems  Positive: Patient has chest pain and nausea.   Negative: No abdominal pain.   Physical Exam  BP (!) 204/99 (BP Location: Left Arm)   Pulse 71   Temp 97.8 F (36.6 C) (Oral)   Resp 18   Ht 5\' 6"  (1.676 m)   Wt 77.1 kg   SpO2 96%   BMI 27.44 kg/m  Gen:   Awake, no distress  Resp:  Normal effort  MSK:   Moves extremities without difficulty  Other:    Medical Decision Making  Medically screening exam initiated at 5:41 PM.  Appropriate orders placed.  Alison Richardson was informed that the remainder of the evaluation will be completed by another provider, this initial triage assessment does not replace that evaluation, and the importance of remaining in the ED until their evaluation is complete.     Alison Richardson [place], PA-C 10/09/21 1742    Alison Drafts, MD 10/09/21 Alison Richardson

## 2021-10-10 ENCOUNTER — Other Ambulatory Visit: Payer: Self-pay

## 2021-10-10 ENCOUNTER — Encounter: Payer: Self-pay | Admitting: Emergency Medicine

## 2021-10-10 ENCOUNTER — Inpatient Hospital Stay
Admission: EM | Admit: 2021-10-10 | Discharge: 2021-10-17 | DRG: 281 | Disposition: A | Discharge status: Skilled Nursing Facility | Payer: Medicare PPO | Attending: Internal Medicine | Admitting: Internal Medicine

## 2021-10-10 DIAGNOSIS — M5136 Other intervertebral disc degeneration, lumbar region: Secondary | ICD-10-CM | POA: Diagnosis present

## 2021-10-10 DIAGNOSIS — Z8249 Family history of ischemic heart disease and other diseases of the circulatory system: Secondary | ICD-10-CM

## 2021-10-10 DIAGNOSIS — E1165 Type 2 diabetes mellitus with hyperglycemia: Secondary | ICD-10-CM | POA: Diagnosis present

## 2021-10-10 DIAGNOSIS — I2510 Atherosclerotic heart disease of native coronary artery without angina pectoris: Secondary | ICD-10-CM | POA: Diagnosis present

## 2021-10-10 DIAGNOSIS — G3184 Mild cognitive impairment, so stated: Secondary | ICD-10-CM | POA: Diagnosis present

## 2021-10-10 DIAGNOSIS — Z7901 Long term (current) use of anticoagulants: Secondary | ICD-10-CM

## 2021-10-10 DIAGNOSIS — E785 Hyperlipidemia, unspecified: Secondary | ICD-10-CM | POA: Diagnosis present

## 2021-10-10 DIAGNOSIS — I4891 Unspecified atrial fibrillation: Secondary | ICD-10-CM

## 2021-10-10 DIAGNOSIS — Z7984 Long term (current) use of oral hypoglycemic drugs: Secondary | ICD-10-CM

## 2021-10-10 DIAGNOSIS — J329 Chronic sinusitis, unspecified: Secondary | ICD-10-CM | POA: Diagnosis present

## 2021-10-10 DIAGNOSIS — E1140 Type 2 diabetes mellitus with diabetic neuropathy, unspecified: Secondary | ICD-10-CM | POA: Diagnosis present

## 2021-10-10 DIAGNOSIS — F0280 Dementia in other diseases classified elsewhere without behavioral disturbance: Secondary | ICD-10-CM | POA: Diagnosis present

## 2021-10-10 DIAGNOSIS — Z833 Family history of diabetes mellitus: Secondary | ICD-10-CM

## 2021-10-10 DIAGNOSIS — Z9842 Cataract extraction status, left eye: Secondary | ICD-10-CM

## 2021-10-10 DIAGNOSIS — I480 Paroxysmal atrial fibrillation: Secondary | ICD-10-CM

## 2021-10-10 DIAGNOSIS — B9562 Methicillin resistant Staphylococcus aureus infection as the cause of diseases classified elsewhere: Secondary | ICD-10-CM | POA: Diagnosis present

## 2021-10-10 DIAGNOSIS — Z66 Do not resuscitate: Secondary | ICD-10-CM | POA: Diagnosis present

## 2021-10-10 DIAGNOSIS — I110 Hypertensive heart disease with heart failure: Secondary | ICD-10-CM | POA: Diagnosis present

## 2021-10-10 DIAGNOSIS — E782 Mixed hyperlipidemia: Secondary | ICD-10-CM | POA: Diagnosis present

## 2021-10-10 DIAGNOSIS — I214 Non-ST elevation (NSTEMI) myocardial infarction: Principal | ICD-10-CM

## 2021-10-10 DIAGNOSIS — Z96652 Presence of left artificial knee joint: Secondary | ICD-10-CM | POA: Diagnosis present

## 2021-10-10 DIAGNOSIS — E876 Hypokalemia: Secondary | ICD-10-CM | POA: Diagnosis not present

## 2021-10-10 DIAGNOSIS — E7800 Pure hypercholesterolemia, unspecified: Secondary | ICD-10-CM | POA: Diagnosis present

## 2021-10-10 DIAGNOSIS — Z9841 Cataract extraction status, right eye: Secondary | ICD-10-CM

## 2021-10-10 DIAGNOSIS — Z20822 Contact with and (suspected) exposure to covid-19: Secondary | ICD-10-CM | POA: Diagnosis present

## 2021-10-10 DIAGNOSIS — J340 Abscess, furuncle and carbuncle of nose: Secondary | ICD-10-CM | POA: Diagnosis not present

## 2021-10-10 DIAGNOSIS — G301 Alzheimer's disease with late onset: Secondary | ICD-10-CM | POA: Diagnosis present

## 2021-10-10 DIAGNOSIS — E1151 Type 2 diabetes mellitus with diabetic peripheral angiopathy without gangrene: Secondary | ICD-10-CM | POA: Diagnosis present

## 2021-10-10 DIAGNOSIS — Z79890 Hormone replacement therapy (postmenopausal): Secondary | ICD-10-CM

## 2021-10-10 DIAGNOSIS — I671 Cerebral aneurysm, nonruptured: Secondary | ICD-10-CM | POA: Diagnosis present

## 2021-10-10 DIAGNOSIS — Z888 Allergy status to other drugs, medicaments and biological substances status: Secondary | ICD-10-CM

## 2021-10-10 DIAGNOSIS — I440 Atrioventricular block, first degree: Secondary | ICD-10-CM | POA: Diagnosis present

## 2021-10-10 DIAGNOSIS — I5032 Chronic diastolic (congestive) heart failure: Secondary | ICD-10-CM

## 2021-10-10 DIAGNOSIS — K219 Gastro-esophageal reflux disease without esophagitis: Secondary | ICD-10-CM | POA: Diagnosis present

## 2021-10-10 DIAGNOSIS — I152 Hypertension secondary to endocrine disorders: Secondary | ICD-10-CM | POA: Diagnosis present

## 2021-10-10 DIAGNOSIS — I10 Essential (primary) hypertension: Secondary | ICD-10-CM | POA: Diagnosis not present

## 2021-10-10 DIAGNOSIS — E039 Hypothyroidism, unspecified: Secondary | ICD-10-CM | POA: Diagnosis present

## 2021-10-10 DIAGNOSIS — E1159 Type 2 diabetes mellitus with other circulatory complications: Secondary | ICD-10-CM | POA: Diagnosis present

## 2021-10-10 DIAGNOSIS — G4733 Obstructive sleep apnea (adult) (pediatric): Secondary | ICD-10-CM | POA: Diagnosis present

## 2021-10-10 DIAGNOSIS — J302 Other seasonal allergic rhinitis: Secondary | ICD-10-CM | POA: Diagnosis present

## 2021-10-10 DIAGNOSIS — Z9049 Acquired absence of other specified parts of digestive tract: Secondary | ICD-10-CM

## 2021-10-10 DIAGNOSIS — Z79899 Other long term (current) drug therapy: Secondary | ICD-10-CM

## 2021-10-10 DIAGNOSIS — Z85828 Personal history of other malignant neoplasm of skin: Secondary | ICD-10-CM

## 2021-10-10 DIAGNOSIS — J3489 Other specified disorders of nose and nasal sinuses: Secondary | ICD-10-CM | POA: Diagnosis not present

## 2021-10-10 DIAGNOSIS — E1169 Type 2 diabetes mellitus with other specified complication: Secondary | ICD-10-CM | POA: Diagnosis not present

## 2021-10-10 DIAGNOSIS — T380X5A Adverse effect of glucocorticoids and synthetic analogues, initial encounter: Secondary | ICD-10-CM | POA: Diagnosis not present

## 2021-10-10 LAB — RESP PANEL BY RT-PCR (FLU A&B, COVID) ARPGX2
Influenza A by PCR: NEGATIVE
Influenza B by PCR: NEGATIVE
SARS Coronavirus 2 by RT PCR: NEGATIVE

## 2021-10-10 LAB — HEPARIN LEVEL (UNFRACTIONATED): Heparin Unfractionated: >1.1 IU/mL — ABNORMAL HIGH (ref 0.30–0.70)

## 2021-10-10 LAB — CBG MONITORING, ED: Glucose-Capillary: 158 mg/dL — ABNORMAL HIGH (ref 70–99)

## 2021-10-10 LAB — APTT
aPTT: 30 seconds (ref 24–36)
aPTT: 50 seconds — ABNORMAL HIGH (ref 24–36)
aPTT: 65 seconds — ABNORMAL HIGH (ref 24–36)

## 2021-10-10 LAB — PROTIME-INR
INR: 1.2 (ref 0.8–1.2)
Prothrombin Time: 14.9 seconds (ref 11.4–15.2)

## 2021-10-10 LAB — TROPONIN I (HIGH SENSITIVITY)
Troponin I (High Sensitivity): 716 ng/L (ref <18)
Troponin I (High Sensitivity): 7649 ng/L (ref <18)
Troponin I (High Sensitivity): 2798 ng/L (ref <18)
Troponin I (High Sensitivity): 1621 ng/L (ref <18)

## 2021-10-10 MED ORDER — ASPIRIN 81 MG PO CHEW
324.0000 mg | CHEWABLE_TABLET | Freq: Once | ORAL | Status: AC
  Administered 2021-10-10: 324 mg via ORAL
  Filled 2021-10-10: qty 4

## 2021-10-10 MED ORDER — DILTIAZEM HCL-DEXTROSE 125-5 MG/125ML-% IV SOLN (PREMIX)
5.0000 mg/h | INTRAVENOUS | Status: DC
  Administered 2021-10-10: 5 mg/h via INTRAVENOUS
  Administered 2021-10-11: 15 mg/h via INTRAVENOUS
  Administered 2021-10-11: 7.5 mg/h via INTRAVENOUS
  Administered 2021-10-12 (×2): 15 mg/h via INTRAVENOUS
  Filled 2021-10-10 (×7): qty 125

## 2021-10-10 MED ORDER — DONEPEZIL HCL 5 MG PO TABS
10.0000 mg | ORAL_TABLET | Freq: Every day | ORAL | Status: DC
  Administered 2021-10-10 – 2021-10-16 (×7): 10 mg via ORAL
  Filled 2021-10-10 (×9): qty 2

## 2021-10-10 MED ORDER — AMIODARONE HCL 200 MG PO TABS
200.0000 mg | ORAL_TABLET | Freq: Every day | ORAL | Status: DC
  Administered 2021-10-10 – 2021-10-17 (×8): 200 mg via ORAL
  Filled 2021-10-10 (×8): qty 1

## 2021-10-10 MED ORDER — HEPARIN (PORCINE) 25000 UT/250ML-% IV SOLN
1600.0000 [IU]/h | INTRAVENOUS | Status: DC
  Administered 2021-10-10: 1200 [IU]/h via INTRAVENOUS
  Administered 2021-10-10: 950 [IU]/h via INTRAVENOUS
  Filled 2021-10-10 (×4): qty 250

## 2021-10-10 MED ORDER — SALINE SPRAY 0.65 % NA SOLN
1.0000 | NASAL | Status: DC | PRN
  Filled 2021-10-10: qty 44

## 2021-10-10 MED ORDER — FUROSEMIDE 20 MG PO TABS
20.0000 mg | ORAL_TABLET | Freq: Every day | ORAL | Status: DC
  Administered 2021-10-13 – 2021-10-17 (×5): 20 mg via ORAL
  Filled 2021-10-10 (×7): qty 1

## 2021-10-10 MED ORDER — HEPARIN BOLUS VIA INFUSION
2300.0000 [IU] | Freq: Once | INTRAVENOUS | Status: AC
  Administered 2021-10-10: 2300 [IU] via INTRAVENOUS
  Filled 2021-10-10: qty 2300

## 2021-10-10 MED ORDER — AMIODARONE HCL 200 MG PO TABS
200.0000 mg | ORAL_TABLET | Freq: Once | ORAL | Status: AC
  Administered 2021-10-10: 200 mg via ORAL
  Filled 2021-10-10: qty 1

## 2021-10-10 MED ORDER — LEVOTHYROXINE SODIUM 88 MCG PO TABS
88.0000 ug | ORAL_TABLET | Freq: Every day | ORAL | Status: DC
  Administered 2021-10-11 – 2021-10-17 (×7): 88 ug via ORAL
  Filled 2021-10-10 (×9): qty 1

## 2021-10-10 MED ORDER — HEPARIN BOLUS VIA INFUSION
4000.0000 [IU] | Freq: Once | INTRAVENOUS | Status: AC
  Administered 2021-10-10: 4000 [IU] via INTRAVENOUS
  Filled 2021-10-10: qty 4000

## 2021-10-10 MED ORDER — ACETAMINOPHEN 325 MG PO TABS
650.0000 mg | ORAL_TABLET | Freq: Four times a day (QID) | ORAL | Status: DC | PRN
  Administered 2021-10-10 – 2021-10-14 (×7): 650 mg via ORAL
  Filled 2021-10-10 (×8): qty 2

## 2021-10-10 MED ORDER — METOPROLOL TARTRATE 25 MG PO TABS
25.0000 mg | ORAL_TABLET | Freq: Once | ORAL | Status: AC
  Administered 2021-10-10: 25 mg via ORAL
  Filled 2021-10-10: qty 1

## 2021-10-10 NOTE — ED Triage Notes (Signed)
Pt to triage via w/c with no distress noted; pt here earlier but left prior to being seen due to long wait; pt returns after phone call to return for abnormal lab

## 2021-10-10 NOTE — ED Provider Notes (Signed)
[facility] Emergency Department Provider Note  ____________________________________________  Time seen: Approximately 2:52 AM  I have reviewed the triage vital signs and the nursing notes.   HISTORY  Chief Complaint Abnormal Lab   HPI Alison Richardson is a 87 y.o. female with a history of A. fib/a flutter on amiodarone, metoprolol and Eliquis, cerebral aneurysm, CHF, diabetes, hypertension, hyperlipidemia, hypothyroidism, Alzheimer's, OSA who presents for evaluation of chest pain.  Patient reports that earlier today she had an episode lasting about an hour of sharp dull diffuse chest pain across her chest radiating to her jaw.  Patient reports that she felt clammy with the pain.  She denies shortness of breath, vomiting, dizziness.  She did feel nauseated with it.  She denies ever having similar pain.  She denies any prior history of CAD, PE or DVT, recent travel immobilization, leg pain or swelling, hemoptysis or exogenous hormones.  She came to the emergency room but left before being seen.  Her repeat troponin came back elevated at 700 and patient was called back.  Patient denies having any pain at this time.  She has not had any further episodes of pain since leaving the emergency room.   Past Medical History:  Diagnosis Date   Arrhythmia    Atrial flutter ([place]) 01/2018   new onset    Basal cell carcinoma of back    Basal cell carcinoma of lip    Cerebral aneurysm    followed by [facility]   CHF (congestive heart failure) (HCC)    DDD (degenerative disc disease), lumbar    superior plate depression, L3 08/18/2014   Diabetes mellitus type II, controlled ([place])    Diverticulosis    Dysrhythmia    Paroxysmal Supraventricular Tachycardia   Dysthymia    depression   GERD (gastroesophageal reflux disease)    History of meniscal tear    Hyperlipidemia    Hypertension    Hypothyroidism    Late onset Alzheimer's disease with behavioral disturbance ([place])    Mild  pulmonary hypertension (HCC)    Overactive bladder    Peripheral vascular disease (HCC)    Seasonal allergic rhinitis    Sleep apnea     Patient Active Problem List   Diagnosis Date Noted   Acute on chronic diastolic CHF (congestive heart failure), NYHA class 3 ([place]) 10/13/2020   Acute CHF (congestive heart failure) ([place]) 10/07/2020   Elevated troponin 10/07/2020   CHF (congestive heart failure) ([place]) 10/07/2020   Leukocytosis 10/07/2020   DOE (dyspnea on exertion) 05/09/2020   Syncope and collapse 09/30/2019   Syncope 09/29/2019   Cor pulmonale, chronic ([place]) 09/27/2019   Late onset Alzheimer's disease without behavioral disturbance ([place]) 09/23/2019   Obstructive sleep apnea 07/09/2019   Diabetes mellitus type 2 in nonobese ([place]) 01/21/2019   Dyslipidemia 01/21/2019   PSVT (paroxysmal supraventricular tachycardia) ([place]) 01/21/2019   Tenosynovitis of foot 01/21/2019   PAD (peripheral artery disease) ([place]) 01/06/2019   Preop testing 01/06/2019   DM type 2 with diabetic mixed hyperlipidemia ([place]) 09/08/2018   Peripheral neuropathy, idiopathic 09/08/2018   Anxiety disorder 06/19/2018   Depression, major, single episode, mild ([place]) 06/19/2018   Paroxysmal atrial fibrillation ([place]) 06/19/2018   Atrial flutter ([place]) 06/19/2018   Pure hypercholesterolemia 06/19/2018   Vitamin B12 deficiency 06/19/2018   Insomnia 06/19/2018   Protein-calorie malnutrition ([place]) 06/19/2018   Type 2 diabetes mellitus with diabetic neuropathy ([place]) 06/19/2018   Knee pain 06/19/2018   Cognitive impairment, mild, so stated 06/19/2018  Osteoarthritis of right knee 06/19/2018   Benign essential hypertension 06/19/2018   Hypothyroidism 06/19/2018   Overactive bladder 06/19/2018   Generalized muscle weakness 06/19/2018   Difficulty in walking, not elsewhere classified 06/19/2018   Major depression in remission ([place]) 06/18/2018   Primary osteoarthritis involving multiple joints 06/18/2018   Mixed  hyperlipidemia 11/01/2015   Cerebral aneurysm 04/29/2013    Past Surgical History:  Procedure Laterality Date   APPENDECTOMY  1946   BASAL CELL CARCINOMA EXCISION     2006 and 2009 removed from back and lip   CARDIOVASCULAR STRESS TEST  2015   nuclear cardiac stress test negative for ischemia - Dr. Satira Sark   CATARACT EXTRACTION, BILATERAL  2006   COLONOSCOPY  2014   COLONOSCOPY N/A 08/19/2020   Procedure: COLONOSCOPY;  Surgeon: Lesly Rubenstein, MD;  Location: [facility] ENDOSCOPY;  Service: Endoscopy;  Laterality: N/A;   ECTOPIC PREGNANCY SURGERY  1957   ESOPHAGOGASTRODUODENOSCOPY N/A 08/19/2020   Procedure: ESOPHAGOGASTRODUODENOSCOPY (EGD);  Surgeon: Lesly Rubenstein, MD;  Location: [facility] ENDOSCOPY;  Service: Endoscopy;  Laterality: N/A;   INJECTION KNEE Left 05/2015   REPLACEMENT TOTAL KNEE Left 09/06/2016   Dr. Barnet Pall   TONSILLECTOMY AND ADENOIDECTOMY  1953   TOTAL ABDOMINAL HYSTERECTOMY  1986   DU B    Prior to Admission medications   Medication Sig Start Date End Date Taking? Authorizing Provider  amiodarone (PACERONE) 200 MG tablet Take 200 mg by mouth daily.  01/02/19   [provider]  cetirizine (ZYRTEC) 10 MG tablet Take 10 mg by mouth daily.    [provider]  ciclopirox (PENLAC) 8 % solution Apply topically at bedtime. Apply to nail/surrounding skin and daily over previous coat. Every 7 days remove with alcohol and continue. 09/18/21   Hyatt, Max T, DPM  donepezil (ARICEPT) 10 MG tablet Take 10 mg by mouth at bedtime.    [provider]  ELIQUIS 5 MG TABS tablet Take 5 mg by mouth 2 (two) times daily.  05/06/20   [provider]  furosemide (LASIX) 20 MG tablet Take 1 tablet (20 mg total) by mouth daily. 10/07/20   Enzo Bi, MD  gabapentin (NEURONTIN) 100 MG capsule Take 100 mg by mouth at bedtime as needed. 05/06/20   [provider]  glipiZIDE (GLUCOTROL) 10 MG tablet Take 10 mg by mouth every morning. 07/28/21   [provider]  levothyroxine (SYNTHROID) 88 MCG tablet Take 88 mcg by mouth daily.  09/21/19   [provider]  metoprolol tartrate (LOPRESSOR) 25 MG tablet Take 25 mg by mouth 2 (two) times daily. 09/22/20   [provider]  potassium chloride (MICRO-K) 10 MEQ CR capsule Take 10 mEq by mouth 2 (two) times daily.     [provider]  pravastatin (PRAVACHOL) 20 MG tablet Take 20 mg by mouth at bedtime. 07/05/20   [provider]  venlafaxine XR (EFFEXOR-XR) 75 MG 24 hr capsule Take 75 mg by mouth daily with breakfast.     [provider]    Allergies Pantoprazole and Metformin and related  Family History  Problem Relation Age of Onset   Hypertension Mother    CAD Father    Diabetes Sister    Diabetes Paternal Uncle     Social History Social History   Tobacco Use   Smoking status: Never   Smokeless tobacco: Never  Vaping Use   Vaping Use: Never used  Substance Use Topics   Alcohol use: Yes  Alcohol/week: 3.0 standard drinks    Types: 3 Shots of liquor per week    Comment: 3 x week    Drug use: Never    Review of Systems  Constitutional: Negative for fever. Eyes: Negative for visual changes. ENT: Negative for sore throat. Neck: No neck pain  Cardiovascular: + chest pain. Respiratory: Negative for shortness of breath. Gastrointestinal: Negative for abdominal pain, vomiting or diarrhea. Genitourinary: Negative for dysuria. Musculoskeletal: Negative for back pain. Skin: Negative for rash. Neurological: Negative for headaches, weakness or numbness. Psych: No SI or HI  ____________________________________________   PHYSICAL EXAM:  VITAL SIGNS: ED Triage Vitals  Enc Vitals Group     BP 10/10/21 0229 122/86     Pulse Rate 10/10/21 0229 99     Resp 10/10/21 0229 20     Temp 10/10/21 0229 98.6 F (37 C)     Temp Source 10/10/21 0229 Oral     SpO2 10/10/21 0229 95 %     Weight 10/10/21 0220 170 lb (77.1 kg)     Height  10/10/21 0220 5\' 6"  (1.676 m)     Head Circumference --      Peak Flow --      Pain Score 10/10/21 0219 0     Pain Loc --      Pain Edu? --      Excl. in [place]? --     Constitutional: Alert and oriented. Well appearing and in no apparent distress. HEENT:      Head: Normocephalic and atraumatic.         Eyes: Conjunctivae are normal. Sclera is non-icteric.       Mouth/Throat: Mucous membranes are moist.       Neck: Supple with no signs of meningismus. Cardiovascular: Regular rate and rhythm. No murmurs, gallops, or rubs. 2+ symmetrical distal pulses are present in all extremities. No JVD. Respiratory: Normal respiratory effort. Lungs are clear to auscultation bilaterally.  Gastrointestinal: Soft, non tender, and non distended with positive bowel sounds. No rebound or guarding. Genitourinary: No CVA tenderness. Musculoskeletal:  No edema, cyanosis, or erythema of extremities. Neurologic: Normal speech and language. Face is symmetric. Moving all extremities. No gross focal neurologic deficits are appreciated. Skin: Skin is warm, dry and intact. No rash noted. Psychiatric: Mood and affect are normal. Speech and behavior are normal.  ____________________________________________   LABS (all labs ordered are listed, but only abnormal results are displayed)  Labs Reviewed  RESP PANEL BY RT-PCR (FLU A&B, COVID) ARPGX2  PROTIME-INR  APTT  TROPONIN I (HIGH SENSITIVITY)   ____________________________________________  EKG  ED ECG REPORT I, Rudene Re, the attending physician, personally viewed and interpreted this ECG.  Normal sinus rhythm with a rate of 61, first-degree AV block, LAFB, LVH, anterior and inferior Q waves with no ST elevation.   02:37AM - atrial fibrillation with an incomplete left bundle branch block, rate of 122, prolonged QTC, no ST elevations. ____________________________________________  RADIOLOGY  I have personally reviewed the images performed during  this visit and I agree with the Radiologist's read.   Interpretation by Radiologist:  DG Chest 2 View  Result Date: 10/09/2021 CLINICAL DATA:  Chest pain. EXAM: CHEST - 2 VIEW COMPARISON:  Chest x-ray dated October 06, 2020. FINDINGS: The heart size and mediastinal contours are within normal limits. Normal pulmonary vascularity. No focal consolidation, pleural effusion, or pneumothorax. No acute osseous abnormality. IMPRESSION: 1. No acute cardiopulmonary disease. Electronically Signed   By: Titus Dubin M.D.   On:  10/09/2021 18:36     ____________________________________________   PROCEDURES  Procedure(s) performed:yes .1-3 Lead EKG Interpretation Performed by: Rudene Re, MD Authorized by: Rudene Re, MD     Interpretation: abnormal     ECG rate assessment: normal     Rhythm: sinus rhythm     Ectopy: none     Conduction: abnormal     Critical Care performed: yes  CRITICAL CARE Performed by: Rudene Re  ?  Total critical care time: 30 min  Critical care time was exclusive of separately billable procedures and treating other patients.  Critical care was necessary to treat or prevent imminent or life-threatening deterioration.  Critical care was time spent personally by me on the following activities: development of treatment plan with patient and/or surrogate as well as nursing, discussions with consultants, evaluation of patient's response to treatment, examination of patient, obtaining history from patient or surrogate, ordering and performing treatments and interventions, ordering and review of laboratory studies, ordering and review of radiographic studies, pulse oximetry and re-evaluation of patient's condition.  ____________________________________________   INITIAL IMPRESSION / ASSESSMENT AND PLAN / ED COURSE  87 y.o. female with a history of A. fib/a flutter on amiodarone, metoprolol and Eliquis, cerebral aneurysm, CHF, diabetes,  hypertension, hyperlipidemia, hypothyroidism, Alzheimer's, OSA who presents for evaluation of chest pain.  Patient with an episode lasting about an hour of chest pain radiating to the jaw.  Patient has been pain-free for several hours.  Had left the ED before being seen but was called back because her second troponin was elevated.  I reviewed the EKG from the initial visit with did not show any signs of STEMI.  Patient denies any chest pain at this time.  Initial troponin was 35 with a repeat of 716 concerning for an NSTEMI.  We will start patient on heparin, give a full aspirin, and admit patient to the hospitalist service.  Patient is currently in A. fib with a ventricular rate in the 110s.  According to the daughter patient missed her morning dose of medications.  We will give her a dose of p.o. Lopressor and amiodarone and monitor closely for need of IV medications.  Patient placed on telemetry for close monitoring.  History gathered from patient and her daughter and plan discussed with both of them.  Old medical records reviewed      _____________________________________________ Please note:  Patient was evaluated in Emergency Department today for the symptoms described in the history of present illness. Patient was evaluated in the context of the global COVID-19 pandemic, which necessitated consideration that the patient might be at risk for infection with the SARS-CoV-2 virus that causes COVID-19. Institutional protocols and algorithms that pertain to the evaluation of patients at risk for COVID-19 are in a state of rapid change based on information released by regulatory bodies including the CDC and federal and state organizations. These policies and algorithms were followed during the patient's care in the ED.  Some ED evaluations and interventions may be delayed as a result of limited staffing during the pandemic.   [place] Controlled Substance Database was reviewed by  me. ____________________________________________   FINAL CLINICAL IMPRESSION(S) / ED DIAGNOSES   Final diagnoses:  NSTEMI (non-ST elevated myocardial infarction) ([place])  Atrial fibrillation with RVR ([place])      NEW MEDICATIONS STARTED DURING THIS VISIT:  ED Discharge Orders     None        Note:  This document was prepared using Dragon voice recognition software and may  include unintentional dictation errors.    Rudene Re, MD 10/10/21 [id number]

## 2021-10-10 NOTE — Consult Note (Addendum)
[facility] Consultation Note  Patient ID: Alison Richardson, MRN: [id number], DOB/AGE: 07/06/1933 87 y.o. Admit date: 10/10/2021   Date of Consult: 10/10/2021 Primary Physician: Rusty Aus, MD Primary Cardiologist: Dr. Ubaldo Glassing  Chief Complaint:  Chief Complaint  Patient presents with   Abnormal Lab   Reason for Consult:  Elevated troponin, NSTEMI  HPI: 87 y.o. female with a past medical history of atrial fibrillation on Eliquis and amiodarone, type 2 diabetes, hypertension, hyperlipidemia, hypothyroid who presented to the ED with complaints of chest discomfort occurring yesterday at approximately 4 PM described as a pressure across the center of her chest radiating up into her jaw associated with nausea.  Patient currently denies any associated shortness of breath or lightheadedness.  Patient presented to the ED earlier in the day and had had some lab work done but had left and returned after her initial troponin came back elevated around 700.  Patient currently denies any chest pain, shortness of breath, discomfort at this time.  Troponin was initially trending upward to a peak of 7649 currently downtrending to 2798.  EKG currently shows atrial fibrillation at 114 bpm, she is anticoagulated with a heparin drip and took her last dose of Eliquis last night.  Past Medical History:  Diagnosis Date   Arrhythmia    Atrial flutter ([place]) 01/2018   new onset    Basal cell carcinoma of back    Basal cell carcinoma of lip    Cerebral aneurysm    followed by [facility]   CHF (congestive heart failure) (HCC)    DDD (degenerative disc disease), lumbar    superior plate depression, L3 08/18/2014   Diabetes mellitus type II, controlled ([place])    Diverticulosis    Dysrhythmia    Paroxysmal Supraventricular Tachycardia   Dysthymia    depression   GERD (gastroesophageal reflux disease)    History of meniscal tear    Hyperlipidemia    Hypertension    Hypothyroidism    Late onset Alzheimer's  disease with behavioral disturbance ([place])    Mild pulmonary hypertension ([place])    Overactive bladder    Peripheral vascular disease ([place])    Seasonal allergic rhinitis    Sleep apnea       Surgical History:  Past Surgical History:  Procedure Laterality Date   APPENDECTOMY  1946   BASAL CELL CARCINOMA EXCISION     2006 and 2009 removed from back and lip   CARDIOVASCULAR STRESS TEST  2015   nuclear cardiac stress test negative for ischemia - Dr. Satira Sark   CATARACT EXTRACTION, BILATERAL  2006   COLONOSCOPY  2014   COLONOSCOPY N/A 08/19/2020   Procedure: COLONOSCOPY;  Surgeon: Lesly Rubenstein, MD;  Location: [facility] ENDOSCOPY;  Service: Endoscopy;  Laterality: N/A;   ECTOPIC PREGNANCY SURGERY  1957   ESOPHAGOGASTRODUODENOSCOPY N/A 08/19/2020   Procedure: ESOPHAGOGASTRODUODENOSCOPY (EGD);  Surgeon: Lesly Rubenstein, MD;  Location[facility] ENDOSCOPY;  Service: Endoscopy;  Laterality: N/A;   INJECTION KNEE Left 05/2015   REPLACEMENT TOTAL KNEE Left 09/06/2016   Dr. Barnet Pall   TONSILLECTOMY AND ADENOIDECTOMY  1953   TOTAL ABDOMINAL HYSTERECTOMY  1986   [facility]     Home Meds: Prior to Admission medications   Medication Sig Start Date End Date Taking? Authorizing Provider  amiodarone (PACERONE) 200 MG tablet Take 200 mg by mouth daily.  01/02/19  Yes [provider]  cetirizine (ZYRTEC) 10 MG tablet Take 10 mg by mouth daily.   Yes [provider]  donepezil (ARICEPT) 10 MG tablet Take 10 mg by mouth at bedtime.   Yes [provider]  ELIQUIS 5 MG TABS tablet Take 5 mg by mouth 2 (two) times daily.  05/06/20  Yes [provider]  furosemide (LASIX) 20 MG tablet Take 1 tablet (20 mg total) by mouth daily. 10/07/20  Yes Enzo Bi, MD  gabapentin (NEURONTIN) 100 MG capsule Take 100 mg by mouth at bedtime as needed. 05/06/20  Yes [provider]  ibuprofen (ADVIL) 200 MG tablet Take 400 mg by mouth every 4 (four) hours as needed for moderate pain.   Yes  [provider]  levothyroxine (SYNTHROID) 88 MCG tablet Take 88 mcg by mouth daily.  09/21/19  Yes [provider]  metoprolol tartrate (LOPRESSOR) 25 MG tablet Take 25 mg by mouth 2 (two) times daily. 09/22/20  Yes [provider]  potassium chloride (MICRO-K) 10 MEQ CR capsule Take 10 mEq by mouth 2 (two) times daily.    Yes [provider]  pravastatin (PRAVACHOL) 20 MG tablet Take 20 mg by mouth at bedtime. 07/05/20  Yes [provider]  venlafaxine XR (EFFEXOR-XR) 75 MG 24 hr capsule Take 75 mg by mouth daily with breakfast.    Yes [provider]  ciclopirox (PENLAC) 8 % solution Apply topically at bedtime. Apply to nail/surrounding skin and daily over previous coat. Every 7 days remove with alcohol and continue. Patient not taking: Reported on 10/10/2021 09/18/21   Hyatt, Max T, DPM  glipiZIDE (GLUCOTROL XL) 2.5 MG 24 hr tablet Take 2.5 mg by mouth daily. Patient not taking: Reported on 10/10/2021 09/19/21   [provider]    Inpatient Medications:    diltiazem (CARDIZEM) infusion 7.5 mg/hr (10/10/21 1142)   heparin 1,200 Units/hr (10/10/21 1236)    Allergies:  Allergies  Allergen Reactions   Pantoprazole Nausea And Vomiting   Metformin And Related Diarrhea    Social History   Socioeconomic History   Marital status: Married    Spouse name: Not on file   Number of children: 5   Years of education: Not on file   Highest education level: Not on file  Occupational History   Not on file  Tobacco Use   Smoking status: Never   Smokeless tobacco: Never  Vaping Use   Vaping Use: Never used  Substance and Sexual Activity   Alcohol use: Yes    Alcohol/week: 3.0 standard drinks    Types: 3 Shots of liquor per week    Comment: 3 x week    Drug use: Never   Sexual activity: Not on file  Other Topics Concern   Not on file  Social History Narrative   Not on file   Social Determinants of Health   Financial  Resource Strain: Not on file  Food Insecurity: Not on file  Transportation Needs: Not on file  Physical Activity: Not on file  Stress: Not on file  Social Connections: Not on file  Intimate Partner Violence: Not on file     Family History  Problem Relation Age of Onset   Hypertension Mother    CAD Father    Diabetes Sister    Diabetes Paternal Uncle      Review of Systems Positive for chest pain Negative for: General:  chills, fever, night sweats or weight changes.  Cardiovascular: PND orthopnea syncope dizziness  Dermatological skin lesions rashes Respiratory: Cough congestion Urologic: Frequent urination urination at night and hematuria Abdominal: negative for nausea, vomiting, diarrhea,  bright red blood per rectum, melena, or hematemesis Neurologic: negative for visual changes, and/or hearing changes  All other systems reviewed and are otherwise negative except as noted above.  Labs: No results for input(s): CKTOTAL, CKMB, TROPONINI in the last 72 hours. Lab Results  Component Value Date   WBC 10.1 10/09/2021   HGB 15.9 (H) 10/09/2021   HCT 45.4 10/09/2021   MCV 91.9 10/09/2021   PLT 258 10/09/2021    Recent Labs  Lab 10/09/21 1734 10/09/21 1903  NA 139  --   K 4.0  --   CL 105  --   CO2 20*  --   BUN 16  --   CREATININE QUANTITY NOT SUFFICIENT, UNABLE TO PERFORM TEST 0.84  CALCIUM 9.0  --   PROT 7.6  --   BILITOT 0.8  --   ALKPHOS 88  --   ALT 25  --   AST 26  --   GLUCOSE 161*  --    Lab Results  Component Value Date   CHOL 166 05/10/2020   HDL 51 05/10/2020   LDLCALC 98 05/10/2020   TRIG 84 05/10/2020   No results found for: DDIMER  Radiology/Studies:  DG Chest 2 View  Result Date: 10/09/2021 CLINICAL DATA:  Chest pain. EXAM: CHEST - 2 VIEW COMPARISON:  Chest x-ray dated October 06, 2020. FINDINGS: The heart size and mediastinal contours are within normal limits. Normal pulmonary vascularity. No focal consolidation, pleural effusion, or  pneumothorax. No acute osseous abnormality. IMPRESSION: 1. No acute cardiopulmonary disease. Electronically Signed   By: Titus Dubin M.D.   On: 10/09/2021 18:36    EKG: Atrial fibrillation at 81 with a ventricular rate of 114 bpm  Weights: Filed Weights   10/10/21 0220  Weight: 77.1 kg     Physical Exam: Blood pressure (!) 113/92, pulse (!) 104, temperature 98.6 F (37 C), temperature source Oral, resp. rate 18, height 5\' 6"  (1.676 m), weight 77.1 kg, SpO2 95 %. Body mass index is 27.44 kg/m. General: Well developed, well nourished, in no acute distress. Head eyes ears nose throat: Normocephalic, atraumatic, sclera non-icteric, no xanthomas, nares are without discharge. No apparent thyromegaly and/or mass  Lungs: Normal respiratory effort.  no wheezes, no rales, no rhonchi.  Heart: Irregular rate and rhythm with normal S1 S2. no murmur gallop, no rub, PMI is normal size and placement, carotid upstroke normal without bruit, jugular venous pressure is normal Abdomen: Soft, non-tender, non-distended with normoactive bowel sounds. No hepatomegaly. No rebound/guarding. No obvious abdominal masses. Abdominal aorta is normal size without bruit Extremities: No edema. no cyanosis, no clubbing, no ulcers  Peripheral : 2+ bilateral upper extremity pulses, 2+ bilateral femoral pulses, 2+ bilateral dorsal pedal pulse Neuro: Alert and oriented. No facial asymmetry. No focal deficit. Moves all extremities spontaneously. Musculoskeletal: Normal muscle tone without kyphosis Psych:  Responds to questions appropriately with a normal affect.    Assessment: 87 year old female with a past medical history of hypertension, hyperlipidemia, type 2 diabetes, family history of coronary artery disease, atrial fibrillation anticoagulated on Eliquis who presented with an episode of chest pain concerning for acute coronary syndrome with associated elevated troponin peaking around 7000, currently  downtrending.  Plan: -Continue heparin infusion and aspirin for NSTEMI and anticoagulation with paroxysmal atrial fibrillation.  Hold Eliquis at this time. -Continue home metoprolol, amiodarone for heart rate and rhythm control with atrial fibrillation -Add diltiazem drip for additional heart rate control  -Nitroglycerin as needed for chest pain symptoms -Plan for  cardiac catheterization on Thursday for assessment of acute coronary syndrome, last dose of eliquis the night of 10/17  Signed, Jettie Booze [facility] 10/10/2021, 1:54 PM  The patient has been interviewed and examined. I agree with assessment and plan above. Serafina Royals MD [facility]

## 2021-10-10 NOTE — ED Notes (Signed)
Pt assisted to the bathroom with walker

## 2021-10-10 NOTE — ED Notes (Signed)
No change in condition, will continue to monitor.  

## 2021-10-10 NOTE — ED Notes (Signed)
Report received from [facility]. Patient care assumed. Patient/RN introduction complete. Will continue to monitor.

## 2021-10-10 NOTE — Progress Notes (Signed)
ANTICOAGULATION CONSULT NOTE   Pharmacy Consult for heparin infusion Indication: ACS/STEMI  Allergies  Allergen Reactions   Pantoprazole Nausea And Vomiting   Metformin And Related Diarrhea    Patient Measurements: Height: 5\' 6"  (167.6 cm) Weight: 77.1 kg (170 lb) IBW/kg (Calculated) : 59.3 Heparin Dosing Weight: 75 kg  Vital Signs: BP: 130/87 (10/18 2100) Pulse Rate: 98 (10/18 2100)  Labs: Recent Labs    10/09/21 1734 10/09/21 1903 10/10/21 0302 10/10/21 1058 10/10/21 1446 10/10/21 2125  HGB 15.9*  --   --   --   --   --   HCT 45.4  --   --   --   --   --   PLT 258  --   --   --   --   --   APTT  --   --  30 50*  --  65*  LABPROT  --   --  14.9  --   --   --   INR  --   --  1.2  --   --   --   HEPARINUNFRC  --   --  >1.10*  --   --   --   CREATININE QUANTITY NOT SUFFICIENT, UNABLE TO PERFORM TEST 0.84  --   --   --   --   TROPONINIHS 35* 716* 7,649* 2,798* 1,621*  --      Estimated Creatinine Clearance: 50.4 mL/min (by C-G formula based on SCr of 0.84 mg/dL).   Medical History: Past Medical History:  Diagnosis Date   Arrhythmia    Atrial flutter ([place]) 01/2018   new onset    Basal cell carcinoma of back    Basal cell carcinoma of lip    Cerebral aneurysm    followed by [facility]   CHF (congestive heart failure) (HCC)    DDD (degenerative disc disease), lumbar    superior plate depression, L3 08/18/2014   Diabetes mellitus type II, controlled ([place])    Diverticulosis    Dysrhythmia    Paroxysmal Supraventricular Tachycardia   Dysthymia    depression   GERD (gastroesophageal reflux disease)    History of meniscal tear    Hyperlipidemia    Hypertension    Hypothyroidism    Late onset Alzheimer's disease with behavioral disturbance (HCC)    Mild pulmonary hypertension (HCC)    Overactive bladder    Peripheral vascular disease (HCC)    Seasonal allergic rhinitis    Sleep apnea     Medications:  PTA Meds:  Eliquis 5 mg BID  Assessment: 87 yo female  with h/o Afib on Eliquis, DM, HTN, hypothyroidism and dementia who was admitted for CP found with elevated troponin I, trending up 716 >> 7649. Pharmacy was consulted for heparin dosing. Pt on DOAC PTA.   Baseline HL noted to be >1.10 secondary to Eliquis treatment   Date/time HL/aPTT Interpretation/change 10/18 1058 aPTT=50 Subthera at 950 unit/hr 10/18 2125 aPTT=65 Subthera at 1200 units/hr  Goal of Therapy:  Heparin level 0.3-0.7 units/ml once aPTT and heparin level correlate.  aPTT 66-102 seconds Monitor platelets by anticoagulation protocol: Yes   Plan:  aPTT is subtherapeutic. Will increase heparin infusion to 1350 units/hr. Recheck aPTT in 8 hours. Heparin level and CBC with AM labs. Switch to heparin level once heparin level and aPTT correlate.    Oswald Hillock, PharmD 10/10/2021 9:54 PM

## 2021-10-10 NOTE — Progress Notes (Signed)
ANTICOAGULATION CONSULT NOTE   Pharmacy Consult for heparin infusion Indication: ACS/STEMI  Allergies  Allergen Reactions   Pantoprazole Nausea And Vomiting   Metformin And Related Diarrhea    Patient Measurements: Height: 5\' 6"  (167.6 cm) Weight: 77.1 kg (170 lb) IBW/kg (Calculated) : 59.3 Heparin Dosing Weight: 75 kg  Vital Signs: Temp: 98.6 F (37 C) (10/18 0229) Temp Source: Oral (10/18 0229) BP: 122/86 (10/18 0229) Pulse Rate: 99 (10/18 0229)  Labs: Recent Labs    10/09/21 1734 10/09/21 1903  HGB 15.9*  --   HCT 45.4  --   PLT 258  --   CREATININE QUANTITY NOT SUFFICIENT, UNABLE TO PERFORM TEST 0.84  TROPONINIHS 35* 716*    Estimated Creatinine Clearance: 50.4 mL/min (by C-G formula based on SCr of 0.84 mg/dL).   Medical History: Past Medical History:  Diagnosis Date   Arrhythmia    Atrial flutter ([place]) 01/2018   new onset    Basal cell carcinoma of back    Basal cell carcinoma of lip    Cerebral aneurysm    followed by [facility]   CHF (congestive heart failure) (HCC)    DDD (degenerative disc disease), lumbar    superior plate depression, L3 08/18/2014   Diabetes mellitus type II, controlled ([place])    Diverticulosis    Dysrhythmia    Paroxysmal Supraventricular Tachycardia   Dysthymia    depression   GERD (gastroesophageal reflux disease)    History of meniscal tear    Hyperlipidemia    Hypertension    Hypothyroidism    Late onset Alzheimer's disease with behavioral disturbance (HCC)    Mild pulmonary hypertension (HCC)    Overactive bladder    Peripheral vascular disease (HCC)    Seasonal allergic rhinitis    Sleep apnea     Medications:  PTA Meds:  Eliquis 5 mg BID  Assessment: Pt is 87 yo female with h/o Afib on Eliquis, c/o CP found with elevated troponin I, trending up 716 >> 7649.  Goal of Therapy:  Heparin level 0.3-0.7 units/ml aPTT 66-102 seconds Monitor platelets by anticoagulation protocol: Yes   Plan:  Ordered bolus of 4000  units x 1 Start heparin infusion at 950 unit/hr DOAC PTA:  Will follow aPTT until correlation w/ HL Will check aPTT 8 hr after start of infusion Daily HL and CBC while on heparin.  Renda Rolls, PharmD, [facility] 10/10/2021 3:19 AM

## 2021-10-10 NOTE — ED Provider Notes (Signed)
-----------------------------------------   12:34 AM on 10/10/2021 -----------------------------------------   Lab called with elevated troponin 716.  Reviewed patient's chart; seems that she left without being seen.  Charge nurse Lorriane Shire calling patient to return to the ED.   Paulette Blanch, MD 10/10/21 [phone]

## 2021-10-10 NOTE — Progress Notes (Signed)
No chest pain, palpitations or shortness of breath.  She feels better today.  Continue IV heparin infusion for acute NSTEMI.  Plan for left heart cath tomorrow.  Follow-up with cardiologist for further recommendations.

## 2021-10-10 NOTE — ED Notes (Signed)
Pt resting comfortably without complaints... will continue to monitor.

## 2021-10-10 NOTE — ED Notes (Signed)
MD at bedside. 

## 2021-10-10 NOTE — Progress Notes (Signed)
ANTICOAGULATION CONSULT NOTE   Pharmacy Consult for heparin infusion Indication: ACS/STEMI  Allergies  Allergen Reactions   Pantoprazole Nausea And Vomiting   Metformin And Related Diarrhea    Patient Measurements: Height: 5\' 6"  (167.6 cm) Weight: 77.1 kg (170 lb) IBW/kg (Calculated) : 59.3 Heparin Dosing Weight: 75 kg  Vital Signs: Temp: 98.6 F (37 C) (10/18 0229) Temp Source: Oral (10/18 0229) BP: 127/93 (10/18 1000) Pulse Rate: 123 (10/18 1130)  Labs: Recent Labs    10/09/21 1734 10/09/21 1903 10/10/21 0302 10/10/21 1058  HGB 15.9*  --   --   --   HCT 45.4  --   --   --   PLT 258  --   --   --   APTT  --   --  30 50*  LABPROT  --   --  14.9  --   INR  --   --  1.2  --   HEPARINUNFRC  --   --  >1.10*  --   CREATININE QUANTITY NOT SUFFICIENT, UNABLE TO PERFORM TEST 0.84  --   --   TROPONINIHS 35* 716* 7,649*  --      Estimated Creatinine Clearance: 50.4 mL/min (by C-G formula based on SCr of 0.84 mg/dL).   Medical History: Past Medical History:  Diagnosis Date   Arrhythmia    Atrial flutter ([place]) 01/2018   new onset    Basal cell carcinoma of back    Basal cell carcinoma of lip    Cerebral aneurysm    followed by [facility]   CHF (congestive heart failure) (HCC)    DDD (degenerative disc disease), lumbar    superior plate depression, L3 08/18/2014   Diabetes mellitus type II, controlled ([place])    Diverticulosis    Dysrhythmia    Paroxysmal Supraventricular Tachycardia   Dysthymia    depression   GERD (gastroesophageal reflux disease)    History of meniscal tear    Hyperlipidemia    Hypertension    Hypothyroidism    Late onset Alzheimer's disease with behavioral disturbance (HCC)    Mild pulmonary hypertension (HCC)    Overactive bladder    Peripheral vascular disease (HCC)    Seasonal allergic rhinitis    Sleep apnea     Medications:  PTA Meds:  Eliquis 5 mg BID  Assessment: 87 yo female with h/o Afib on Eliquis, DM, HTN, hypothyroidism and  dementia who was admitted for CP found with elevated troponin I, trending up 716 >> 7649. Pharmacy was consulted for heparin dosing  Baseline CBC, PT/INR, aPTT within normal limits  Baseline HL noted to be >1.10 secondary to Eliquis treatment   Date/time HL/aPTT Interpretation/change 10/18 1058 aPTT=50 Subthera at 950 unit/hr  Goal of Therapy:  Heparin level 0.3-0.7 units/ml aPTT 66-102 seconds Monitor platelets by anticoagulation protocol: Yes   Plan:  Heparin subtherapeutic, will bolus 2300 units and increase infusion to 1200 units  Will follow aPTT until correlation w/ HL Will check aPTT 8 hr after rate change  Daily HL and CBC while on heparin.  Narda Rutherford, PharmD Pharmacy Resident  10/10/2021 11:46 AM

## 2021-10-10 NOTE — H&P (Signed)
History and Physical    Alison Richardson GLO:[id number] DOB: [birth year] DOA: 10/10/2021  PCP: Rusty Aus, MD   Patient coming from: home  I have personally briefly reviewed patient's old medical records in [place]  Chief Complaint: chest pain  HPI: Alison Richardson is a 87 y.o. female with medical history significant for Atrial fibrillation on Eliquis and amiodarone, diabetes, hypertension, hypothyroidism and dementia who presented to the ED earlier in the day on 10/17 with chest pain leaving after waiting for several hours but returned after her first troponin resulting in the 700s.  She stated that she was in her usual state of health when she had a dull chest pain across her anterior chest radiating to the jaw associated with a feeling of clamminess and nausea.  She denied associated shortness of breath or lightheadedness.  She stated that when she left the ED the first time her pain had already gone and had not returned by the time she was called back to the ED .  She currently denies chest pain, shortness of breath, nausea, palpitations or lightheadedness.  ED course: On arrival vitals within normal limits however patient went into RVR with rate in the 120s Blood work troponin: 716> [phone] with otherwise unremarkable blood work  EKG, personally viewed and interpreted NSR at 61 with first-degree AV block and no acute ST-T wave changes EKG #2 A. fib with incomplete left bundle rate of 122 still with no acute EKG changes  Imaging: Chest x-ray done during first visit to the ED no acute cardiopulmonary disease  Patient started on a heparin infusion given chewable aspirin and also given her home dose of metoprolol and amiodarone.  Hospitalist consulted for admission.  Chronic diastolic CHF chronic diastolic  Review of Systems: As per HPI otherwise all other systems on review of systems negative.    Past Medical History:  Diagnosis Date   Arrhythmia    Atrial flutter ([place]) 01/2018    new onset    Basal cell carcinoma of back    Basal cell carcinoma of lip    Cerebral aneurysm    followed by [facility]   CHF (congestive heart failure) (HCC)    DDD (degenerative disc disease), lumbar    superior plate depression, L3 08/18/2014   Diabetes mellitus type II, controlled ([place])    Diverticulosis    Dysrhythmia    Paroxysmal Supraventricular Tachycardia   Dysthymia    depression   GERD (gastroesophageal reflux disease)    History of meniscal tear    Hyperlipidemia    Hypertension    Hypothyroidism    Late onset Alzheimer's disease with behavioral disturbance ([place])    Mild pulmonary hypertension ([place])    Overactive bladder    Peripheral vascular disease ([place])    Seasonal allergic rhinitis    Sleep apnea     Past Surgical History:  Procedure Laterality Date   APPENDECTOMY  1946   BASAL CELL CARCINOMA EXCISION     2006 and 2009 removed from back and lip   CARDIOVASCULAR STRESS TEST  2015   nuclear cardiac stress test negative for ischemia - Dr. Satira Sark   CATARACT EXTRACTION, BILATERAL  2006   COLONOSCOPY  2014   COLONOSCOPY N/A 08/19/2020   Procedure: COLONOSCOPY;  Surgeon: Lesly Rubenstein, MD;  Location: [facility] ENDOSCOPY;  Service: Endoscopy;  Laterality: N/A;   ECTOPIC PREGNANCY SURGERY  1957   ESOPHAGOGASTRODUODENOSCOPY N/A 08/19/2020   Procedure: ESOPHAGOGASTRODUODENOSCOPY (EGD);  Surgeon: Lesly Rubenstein, MD;  Location: [facility]  ENDOSCOPY;  Service: Endoscopy;  Laterality: N/A;   INJECTION KNEE Left 05/2015   REPLACEMENT TOTAL KNEE Left 09/06/2016   Dr. Barnet Pall   TONSILLECTOMY AND ADENOIDECTOMY  1953   TOTAL ABDOMINAL HYSTERECTOMY  1986   [facility]     reports that she has never smoked. She has never used smokeless tobacco. She reports current alcohol use of about 3.0 standard drinks per week. She reports that she does not use drugs.  Allergies  Allergen Reactions   Pantoprazole Nausea And Vomiting   Metformin And Related Diarrhea    Family History   Problem Relation Age of Onset   Hypertension Mother    CAD Father    Diabetes Sister    Diabetes Paternal Uncle       Prior to Admission medications   Medication Sig Start Date End Date Taking? Authorizing Provider  amiodarone (PACERONE) 200 MG tablet Take 200 mg by mouth daily.  01/02/19   [provider]  cetirizine (ZYRTEC) 10 MG tablet Take 10 mg by mouth daily.    [provider]  ciclopirox (PENLAC) 8 % solution Apply topically at bedtime. Apply to nail/surrounding skin and daily over previous coat. Every 7 days remove with alcohol and continue. 09/18/21   Hyatt, Max T, DPM  donepezil (ARICEPT) 10 MG tablet Take 10 mg by mouth at bedtime.    [provider]  ELIQUIS 5 MG TABS tablet Take 5 mg by mouth 2 (two) times daily.  05/06/20   [provider]  furosemide (LASIX) 20 MG tablet Take 1 tablet (20 mg total) by mouth daily. 10/07/20   Enzo Bi, MD  gabapentin (NEURONTIN) 100 MG capsule Take 100 mg by mouth at bedtime as needed. 05/06/20   [provider]  glipiZIDE (GLUCOTROL) 10 MG tablet Take 10 mg by mouth every morning. 07/28/21   [provider]  levothyroxine (SYNTHROID) 88 MCG tablet Take 88 mcg by mouth daily.  09/21/19   [provider]  metoprolol tartrate (LOPRESSOR) 25 MG tablet Take 25 mg by mouth 2 (two) times daily. 09/22/20   [provider]  potassium chloride (MICRO-K) 10 MEQ CR capsule Take 10 mEq by mouth 2 (two) times daily.     [provider]  pravastatin (PRAVACHOL) 20 MG tablet Take 20 mg by mouth at bedtime. 07/05/20   [provider]  venlafaxine XR (EFFEXOR-XR) 75 MG 24 hr capsule Take 75 mg by mouth daily with breakfast.     [provider]    Physical Exam: Vitals:   10/10/21 0220 10/10/21 0229  BP:  122/86  Pulse:  99  Resp:  20  Temp:  98.6 F (37 C)  TempSrc:  Oral  SpO2:  95%  Weight: 77.1 kg   Height: 5\' 6"  (1.676 m)      Vitals:   10/10/21  0220 10/10/21 0229  BP:  122/86  Pulse:  99  Resp:  20  Temp:  98.6 F (37 C)  TempSrc:  Oral  SpO2:  95%  Weight: 77.1 kg   Height: 5\' 6"  (1.676 m)       Constitutional: Alert and oriented x 3 . Not in any apparent distress HEENT:      Head: Normocephalic and atraumatic.         Eyes: PERLA, EOMI, Conjunctivae are normal. Sclera is non-icteric.       Mouth/Throat: Mucous membranes are moist.       Neck: Supple with no signs of meningismus. Cardiovascular:  Regular rate and rhythm. No murmurs, gallops, or rubs. 2+ symmetrical distal pulses are present . No JVD. No LE edema Respiratory: Respiratory effort normal .Lungs sounds clear bilaterally. No wheezes, crackles, or rhonchi.  Gastrointestinal: Soft, non tender, and non distended with positive bowel sounds.  Genitourinary: No CVA tenderness. Musculoskeletal: Nontender with normal range of motion in all extremities. No cyanosis, or erythema of extremities. Neurologic:  Face is symmetric. Moving all extremities. No gross focal neurologic deficits . Skin: Skin is warm, dry.  No rash or ulcers Psychiatric: Mood and affect are normal    Labs on Admission: I have personally reviewed following labs and imaging studies  CBC: Recent Labs  Lab 10/09/21 1734  WBC 10.1  NEUTROABS 7.0  HGB 15.9*  HCT 45.4  MCV 91.9  PLT 408   Basic Metabolic Panel: Recent Labs  Lab 10/09/21 1734 10/09/21 1903  NA 139  --   K 4.0  --   CL 105  --   CO2 20*  --   GLUCOSE 161*  --   BUN 16  --   CREATININE QUANTITY NOT SUFFICIENT, UNABLE TO PERFORM TEST 0.84  CALCIUM 9.0  --    GFR: Estimated Creatinine Clearance: 50.4 mL/min (by C-G formula based on SCr of 0.84 mg/dL). Liver Function Tests: Recent Labs  Lab 10/09/21 1734  AST 26  ALT 25  ALKPHOS 88  BILITOT 0.8  PROT 7.6  ALBUMIN 4.2   Recent Labs  Lab 10/09/21 1734  LIPASE 24   No results for input(s): AMMONIA in the last 168 hours. Coagulation Profile: Recent Labs  Lab  10/10/21 0302  INR 1.2   Cardiac Enzymes: No results for input(s): CKTOTAL, CKMB, CKMBINDEX, TROPONINI in the last 168 hours. BNP (last 3 results) No results for input(s): PROBNP in the last 8760 hours. HbA1C: No results for input(s): HGBA1C in the last 72 hours. CBG: No results for input(s): GLUCAP in the last 168 hours. Lipid Profile: No results for input(s): CHOL, HDL, LDLCALC, TRIG, CHOLHDL, LDLDIRECT in the last 72 hours. Thyroid Function Tests: No results for input(s): TSH, T4TOTAL, FREET4, T3FREE, THYROIDAB in the last 72 hours. Anemia Panel: No results for input(s): VITAMINB12, FOLATE, FERRITIN, TIBC, IRON, RETICCTPCT in the last 72 hours. Urine analysis:    Component Value Date/Time   COLORURINE YELLOW (A) 10/09/2021 1734   APPEARANCEUR HAZY (A) 10/09/2021 1734   LABSPEC 1.021 10/09/2021 1734   PHURINE 5.0 10/09/2021 1734   GLUCOSEU 150 (A) 10/09/2021 1734   HGBUR SMALL (A) 10/09/2021 1734   BILIRUBINUR NEGATIVE 10/09/2021 1734   KETONESUR 5 (A) 10/09/2021 1734   PROTEINUR 100 (A) 10/09/2021 1734   NITRITE NEGATIVE 10/09/2021 1734   LEUKOCYTESUR SMALL (A) 10/09/2021 1734    Radiological Exams on Admission: DG Chest 2 View  Result Date: 10/09/2021 CLINICAL DATA:  Chest pain. EXAM: CHEST - 2 VIEW COMPARISON:  Chest x-ray dated October 06, 2020. FINDINGS: The heart size and mediastinal contours are within normal limits. Normal pulmonary vascularity. No focal consolidation, pleural effusion, or pneumothorax. No acute osseous abnormality. IMPRESSION: 1. No acute cardiopulmonary disease. Electronically Signed   By: Titus Dubin M.D.   On: 10/09/2021 18:36     Assessment/Plan    NSTEMI (non-ST elevated myocardial infarction) [facility]) -Patient presents with typical chest pain, troponin 716> 7649 but without acute ST-T wave changes - Continue heparin infusion and aspirin - Continue home metoprolol and pravastatin - Nitroglycerin sublingual as needed chest pain with  morphine for breakthrough - Cardiology  consult for recommendations on further restratification    Paroxysmal atrial fibrillation with rapid ventricular response - Continue amiodarone and metoprolol - Eliquis on hold as patient on heparin infusion    Type 2 diabetes mellitus with diabetic neuropathy (HCC) - Sliding scale insulin coverage    Benign essential hypertension - Continue metoprolol    Chronic diastolic CHF (congestive heart failure) (HCC) - Continue metoprolol, furosemide    Cognitive impairment, mild, so stated - Continue donepezil    Hypothyroidism - Continue levothyroxine    DVT prophylaxis: Heparin Code Status: full code  Family Communication:  none  Disposition Plan: Back to previous home environment Consults called: Cardiology consult Status:At the time of admission, it appears that the appropriate admission status for this patient is INPATIENT. This is judged to be reasonable and necessary in order to provide the required intensity of service to ensure the patient's safety given the presenting symptoms, physical exam findings, and initial radiographic and laboratory data in the context of their  Comorbid conditions.   Patient requires inpatient status due to high intensity of service, high risk for further deterioration and high frequency of surveillance required.   I certify that at the point of admission it is my clinical judgment that the patient will require inpatient hospital care spanning beyond [place] MD [facility]     10/10/2021, 4:13 AM

## 2021-10-10 NOTE — ED Notes (Signed)
Lab at bedside to collect blood work 

## 2021-10-11 DIAGNOSIS — I480 Paroxysmal atrial fibrillation: Secondary | ICD-10-CM | POA: Diagnosis not present

## 2021-10-11 DIAGNOSIS — E876 Hypokalemia: Secondary | ICD-10-CM

## 2021-10-11 DIAGNOSIS — I214 Non-ST elevation (NSTEMI) myocardial infarction: Secondary | ICD-10-CM | POA: Diagnosis not present

## 2021-10-11 LAB — CBC
HCT: 50 % — ABNORMAL HIGH (ref 36.0–46.0)
Hemoglobin: 17 g/dL — ABNORMAL HIGH (ref 12.0–15.0)
MCH: 30.7 pg (ref 26.0–34.0)
MCHC: 34 g/dL (ref 30.0–36.0)
MCV: 90.3 fL (ref 80.0–100.0)
Platelets: 285 10*3/uL (ref 150–400)
RBC: 5.54 MIL/uL — ABNORMAL HIGH (ref 3.87–5.11)
RDW: 14.3 % (ref 11.5–15.5)
WBC: 14.6 10*3/uL — ABNORMAL HIGH (ref 4.0–10.5)
nRBC: 0 % (ref 0.0–0.2)

## 2021-10-11 LAB — BASIC METABOLIC PANEL
Anion gap: 9 (ref 5–15)
BUN: 10 mg/dL (ref 8–23)
CO2: 26 mmol/L (ref 22–32)
Calcium: 8.7 mg/dL — ABNORMAL LOW (ref 8.9–10.3)
Chloride: 102 mmol/L (ref 98–111)
Creatinine, Ser: 0.78 mg/dL (ref 0.44–1.00)
GFR, Estimated: >60 mL/min (ref >60)
Glucose, Bld: 242 mg/dL — ABNORMAL HIGH (ref 70–99)
Potassium: 3.4 mmol/L — ABNORMAL LOW (ref 3.5–5.1)
Sodium: 137 mmol/L (ref 135–145)

## 2021-10-11 LAB — HEPARIN LEVEL (UNFRACTIONATED): Heparin Unfractionated: >1.1 IU/mL — ABNORMAL HIGH (ref 0.30–0.70)

## 2021-10-11 LAB — APTT
aPTT: 76 seconds — ABNORMAL HIGH (ref 24–36)
aPTT: 67 seconds — ABNORMAL HIGH (ref 24–36)

## 2021-10-11 MED ORDER — SODIUM CHLORIDE 0.9% FLUSH
3.0000 mL | Freq: Two times a day (BID) | INTRAVENOUS | Status: DC
  Administered 2021-10-11 – 2021-10-17 (×10): 3 mL via INTRAVENOUS

## 2021-10-11 MED ORDER — METOPROLOL TARTRATE 50 MG PO TABS
50.0000 mg | ORAL_TABLET | Freq: Two times a day (BID) | ORAL | Status: DC
  Administered 2021-10-11 – 2021-10-12 (×3): 50 mg via ORAL
  Filled 2021-10-11 (×3): qty 1

## 2021-10-11 MED ORDER — POTASSIUM CHLORIDE CRYS ER 20 MEQ PO TBCR
20.0000 meq | EXTENDED_RELEASE_TABLET | Freq: Once | ORAL | Status: AC
  Administered 2021-10-11: 20 meq via ORAL
  Filled 2021-10-11: qty 1

## 2021-10-11 NOTE — Progress Notes (Signed)
[facility] Encounter Note  Patient: Alison Richardson / Admit Date: 10/10/2021 / Date of Encounter: 10/11/2021, 12:59 PM   Subjective: 87 y.o. female with a past medical history of atrial fibrillation on Eliquis and amiodarone, type 2 diabetes, hypertension, hyperlipidemia, hypothyroid who presented to the ED with complaints of chest discomfort occurring 10/17 at approximately 4 PM described as a pressure across the center of her chest radiating up into her jaw associated with nausea. Patient presented to the ED earlier in the day and had had some lab work done but had left and returned after her initial troponin came back elevated around 700. Troponin was initially trending upward to a peak of 7649 currently downtrending to 2798.  EKG currently shows atrial fibrillation at 114 bpm, she is anticoagulated with a heparin drip and took her last dose of Eliquis 10/17.  Patient was started on a diltiazem drip yesterday for further rate control with atrial fibrillation. Today she appears well with no complaints of chest pain, sob at this time. Pt continues to be in atrial fibrillation with a rapid rate of 110-114 bpm.  Review of Systems: Positive ERX:VQMG Negative for: Vision change, hearing change, syncope, dizziness, nausea, vomiting,diarrhea, bloody stool, stomach pain, cough, congestion, diaphoresis, urinary frequency, urinary pain,skin lesions, skin rashes Others previously listed  Objective: Telemetry: atrial fibrillation with rapid ventricular rate Physical Exam: Blood pressure (!) 143/96, pulse (!) 112, temperature 98.6 F (37 C), temperature source Oral, resp. rate (!) 25, height 5\' 6"  (1.676 m), weight 77.1 kg, SpO2 94 %. Body mass index is 27.44 kg/m. General: Well developed, well nourished, in no acute distress. Head: Normocephalic, atraumatic, sclera non-icteric, no xanthomas, nares are without discharge. Neck: No apparent masses Lungs: Normal respirations with no  wheezes, no rhonchi, no rales , no crackles   Heart: irregular rate and rhythm, normal S1 S2, no murmur, no rub, no gallop, PMI is normal size and placement, carotid upstroke normal without bruit, jugular venous pressure normal Abdomen: Soft, non-tender, non-distended with normoactive bowel sounds. No hepatosplenomegaly. Abdominal aorta is normal size without bruit Extremities: No edema, no clubbing, no cyanosis, no ulcers,  Peripheral: 2+ radial, 2+ femoral, 2+ dorsal pedal pulses Neuro: Alert and oriented. Moves all extremities spontaneously. Psych:  Responds to questions appropriately with a normal affect.   Intake/Output Summary (Last 24 hours) at 10/11/2021 1259 Last data filed at 10/11/2021 1036 Gross per 24 hour  Intake 174.68 ml  Output 900 ml  Net -725.32 ml    Inpatient Medications:   amiodarone  200 mg Oral Daily   donepezil  10 mg Oral QHS   furosemide  20 mg Oral Daily   levothyroxine  88 mcg Oral Daily   metoprolol tartrate  50 mg Oral BID   sodium chloride flush  3 mL Intravenous Q12H   Infusions:   diltiazem (CARDIZEM) infusion 10 mg/hr (10/11/21 1036)   heparin 1,350 Units/hr (10/11/21 0729)    Labs: Recent Labs    10/09/21 1734 10/09/21 1903 10/11/21 1214  NA 139  --  137  K 4.0  --  3.4*  CL 105  --  102  CO2 20*  --  26  GLUCOSE 161*  --  242*  BUN 16  --  10  CREATININE QUANTITY NOT SUFFICIENT, UNABLE TO PERFORM TEST 0.84 0.78  CALCIUM 9.0  --  8.7*   Recent Labs    10/09/21 1734  AST 26  ALT 25  ALKPHOS 88  BILITOT 0.8  PROT 7.6  ALBUMIN 4.2   Recent Labs    10/09/21 1734 10/11/21 0710  WBC 10.1 14.6*  NEUTROABS 7.0  --   HGB 15.9* 17.0*  HCT 45.4 50.0*  MCV 91.9 90.3  PLT 258 285   No results for input(s): CKTOTAL, CKMB, TROPONINI in the last 72 hours. Invalid input(s): POCBNP No results for input(s): HGBA1C in the last 72 hours.   Weights: Filed Weights   10/10/21 0220  Weight: 77.1 kg     Radiology/Studies:  DG  Chest 2 View  Result Date: 10/09/2021 CLINICAL DATA:  Chest pain. EXAM: CHEST - 2 VIEW COMPARISON:  Chest x-ray dated October 06, 2020. FINDINGS: The heart size and mediastinal contours are within normal limits. Normal pulmonary vascularity. No focal consolidation, pleural effusion, or pneumothorax. No acute osseous abnormality. IMPRESSION: 1. No acute cardiopulmonary disease. Electronically Signed   By: Titus Dubin M.D.   On: 10/09/2021 18:36     Assessment and Recommendation  87 y.o. female with a past medical history of hypertension, hyperlipidemia, type 2 diabetes, family history of coronary artery disease, atrial fibrillation anticoagulated on Eliquis who presented with an episode of chest pain concerning for acute coronary syndrome with associated elevated troponin peaking around 7000, currently downtrending.   Plan: -Continue heparin infusion and aspirin for NSTEMI and anticoagulation with paroxysmal atrial fibrillation.  Continue to hold Eliquis. -Continue home amiodarone for heart rate and rhythm control with atrial fibrillation. -Continue diltiazem drip for heart rate control and add metoprolol 50 mg twice daily for further heart rate control -Nitroglycerin as needed for chest pain symptoms -Plan for cardiac catheterization tomorrow for assessment of acute coronary syndrome, last dose of Eliquis the night of 10/17  Signed, Jettie Booze, PA-C

## 2021-10-11 NOTE — Progress Notes (Signed)
PROGRESS NOTE    Alison Richardson  BCW:[id number] DOB: Dec 09, 1933 DOA: 10/10/2021 PCP: Rusty Aus, MD    Assessment & Plan:   Principal Problem:   NSTEMI (non-ST elevated myocardial infarction) [facility]) Active Problems:   Paroxysmal atrial fibrillation with rapid ventricular response (HCC)   Type 2 diabetes mellitus with diabetic neuropathy (HCC)   Cognitive impairment, mild, so stated   Benign essential hypertension   Hypothyroidism   Chronic diastolic CHF (congestive heart failure) ([place])   NSTEMI: w/ chest pain, elevated troponins. Continue on IV heparin drip & continue to hold eliquis. Continue on metoprolol, aspirin & statin. Nitro prn. Cardiac cath tomorrow as per cardio. Continue on tele    PAF: w/ RVR. Continue on amiodarone, metoprolol. Continue to hold home dose of eliquis while on IV heparin drip    DM2: likely poorly controlled. Continue on SSI w/ accuchecks    HTN: continue on metoprolol    Chronic diastolic CHF: continue on metoprolol, lasix. Monitor I/Os.   Cognitive impairment: continue on home dose of donepezil    Hypothyroidism: continue on levothyroxine   Leukocytosis: likely reactive. Will continue to monitor   Hypokalemia: KCl repleated. Will continue to monitor    DVT prophylaxis:  IV heparin  Code Status: full  Family Communication:  Disposition Plan: depends on PT/OT recs (not consulted yet)  Level of care: Progressive Cardiac  Status is: Inpatient  Remains inpatient appropriate because: will go for cardiac cath tomorrow     Consultants:  Cardio   Procedures:   Antimicrobials:    Subjective: Pt c/o fatigue   Objective: Vitals:   10/11/21 1330 10/11/21 1345 10/11/21 1400 10/11/21 1430  BP: (!) 134/97  (!) 156/84 (!) 148/92  Pulse: 66 92 100 97  Resp: 16 18 14  (!) 27  Temp:      TempSrc:      SpO2: 97% 98% 94% 93%  Weight:      Height:        Intake/Output Summary (Last 24 hours) at 10/11/2021 1512 Last data filed at  10/11/2021 1350 Gross per 24 hour  Intake 211.32 ml  Output 900 ml  Net -688.68 ml   Filed Weights   10/10/21 0220  Weight: 77.1 kg    Examination:  General exam: Appears calm and comfortable  Respiratory system: Clear to auscultation. Respiratory effort normal. Cardiovascular system: S1 & S2 +. No  rubs, gallops or clicks.  Gastrointestinal system: Abdomen is nondistended, soft and nontender. Normal bowel sounds heard. Central nervous system: Alert and oriented. Moves all extremities  Psychiatry: Judgement and insight appear normal. Flat mood and affect     Data Reviewed: I have personally reviewed following labs and imaging studies  CBC: Recent Labs  Lab 10/09/21 1734 10/11/21 0710  WBC 10.1 14.6*  NEUTROABS 7.0  --   HGB 15.9* 17.0*  HCT 45.4 50.0*  MCV 91.9 90.3  PLT 258 038   Basic Metabolic Panel: Recent Labs  Lab 10/09/21 1734 10/09/21 1903 10/11/21 1214  NA 139  --  137  K 4.0  --  3.4*  CL 105  --  102  CO2 20*  --  26  GLUCOSE 161*  --  242*  BUN 16  --  10  CREATININE QUANTITY NOT SUFFICIENT, UNABLE TO PERFORM TEST 0.84 0.78  CALCIUM 9.0  --  8.7*   GFR: Estimated Creatinine Clearance: 52.9 mL/min (by C-G formula based on SCr of 0.78 mg/dL). Liver Function Tests: Recent Labs  Lab 10/09/21 1734  AST 26  ALT 25  ALKPHOS 88  BILITOT 0.8  PROT 7.6  ALBUMIN 4.2   Recent Labs  Lab 10/09/21 1734  LIPASE 24   No results for input(s): AMMONIA in the last 168 hours. Coagulation Profile: Recent Labs  Lab 10/10/21 0302  INR 1.2   Cardiac Enzymes: No results for input(s): CKTOTAL, CKMB, CKMBINDEX, TROPONINI in the last 168 hours. BNP (last 3 results) No results for input(s): PROBNP in the last 8760 hours. HbA1C: No results for input(s): HGBA1C in the last 72 hours. CBG: Recent Labs  Lab 10/10/21 0608  GLUCAP 158*   Lipid Profile: No results for input(s): CHOL, HDL, LDLCALC, TRIG, CHOLHDL, LDLDIRECT in the last 72 hours. Thyroid  Function Tests: No results for input(s): TSH, T4TOTAL, FREET4, T3FREE, THYROIDAB in the last 72 hours. Anemia Panel: No results for input(s): VITAMINB12, FOLATE, FERRITIN, TIBC, IRON, RETICCTPCT in the last 72 hours. Sepsis Labs: No results for input(s): PROCALCITON, LATICACIDVEN in the last 168 hours.  Recent Results (from the past 240 hour(s))  Resp Panel by RT-PCR (Flu A&B, Covid) Nasopharyngeal Swab     Status: None   Collection Time: 10/10/21  3:02 AM   Specimen: Nasopharyngeal Swab; Nasopharyngeal(NP) swabs in vial transport medium  Result Value Ref Range Status   SARS Coronavirus 2 by RT PCR NEGATIVE NEGATIVE Final    Comment: (NOTE) SARS-CoV-2 target nucleic acids are NOT DETECTED.  The SARS-CoV-2 RNA is generally detectable in upper respiratory specimens during the acute phase of infection. The lowest concentration of SARS-CoV-2 viral copies this assay can detect is 138 copies/mL. A negative result does not preclude SARS-Cov-2 infection and should not be used as the sole basis for treatment or other patient management decisions. A negative result may occur with  improper specimen collection/handling, submission of specimen other than nasopharyngeal swab, presence of viral mutation(s) within the areas targeted by this assay, and inadequate number of viral copies(<138 copies/mL). A negative result must be combined with clinical observations, patient history, and epidemiological information. The expected result is Negative.  Fact Sheet for Patients:  EntrepreneurPulse.com.au  Fact Sheet for Healthcare Providers:  IncredibleEmployment.be  This test is no t yet approved or cleared by the [place] FDA and  has been authorized for detection and/or diagnosis of SARS-CoV-2 by FDA under an Emergency Use Authorization (EUA). This EUA will remain  in effect (meaning this test can be used) for the duration of the COVID-19 declaration under  Section 564(b)(1) of the Act, 21 U.S.C.section 360bbb-3(b)(1), unless the authorization is terminated  or revoked sooner.       Influenza A by PCR NEGATIVE NEGATIVE Final   Influenza B by PCR NEGATIVE NEGATIVE Final    Comment: (NOTE) The Xpert Xpress SARS-CoV-2/FLU/RSV plus assay is intended as an aid in the diagnosis of influenza from Nasopharyngeal swab specimens and should not be used as a sole basis for treatment. Nasal washings and aspirates are unacceptable for Xpert Xpress SARS-CoV-2/FLU/RSV testing.  Fact Sheet for Patients: EntrepreneurPulse.com.au  Fact Sheet for Healthcare Providers: IncredibleEmployment.be  This test is not yet approved or cleared by the [place] FDA and has been authorized for detection and/or diagnosis of SARS-CoV-2 by FDA under an Emergency Use Authorization (EUA). This EUA will remain in effect (meaning this test can be used) for the duration of the COVID-19 declaration under Section 564(b)(1) of the Act, 21 U.S.C. section 360bbb-3(b)(1), unless the authorization is terminated or revoked.  Performed at [facility], [address] [place],  [place] [address]          Radiology Studies: DG Chest 2 View  Result Date: 10/09/2021 CLINICAL DATA:  Chest pain. EXAM: CHEST - 2 VIEW COMPARISON:  Chest x-ray dated October 06, 2020. FINDINGS: The heart size and mediastinal contours are within normal limits. Normal pulmonary vascularity. No focal consolidation, pleural effusion, or pneumothorax. No acute osseous abnormality. IMPRESSION: 1. No acute cardiopulmonary disease. Electronically Signed   By: Titus Dubin M.D.   On: 10/09/2021 18:36        Scheduled Meds:  amiodarone  200 mg Oral Daily   donepezil  10 mg Oral QHS   furosemide  20 mg Oral Daily   levothyroxine  88 mcg Oral Daily   metoprolol tartrate  50 mg Oral BID   sodium chloride flush  3 mL Intravenous Q12H   Continuous  Infusions:  diltiazem (CARDIZEM) infusion 15 mg/hr (10/11/21 1350)   heparin 1,350 Units/hr (10/11/21 0729)     LOS: 1 day    Time spent: 32 mins     Wyvonnia Dusky, MD [facility] Pager [phone]  If 7PM-7AM, please contact night-coverage 10/11/2021, 3:12 PM

## 2021-10-11 NOTE — ED Notes (Signed)
Pt alert.  Meds infusing  family with pt  pt waiting on admission bed.

## 2021-10-11 NOTE — Progress Notes (Signed)
ANTICOAGULATION CONSULT NOTE   Pharmacy Consult for heparin infusion Indication: ACS/STEMI  Allergies  Allergen Reactions   Pantoprazole Nausea And Vomiting   Metformin And Related Diarrhea    Patient Measurements: Height: 5\' 6"  (167.6 cm) Weight: 77.1 kg (170 lb) IBW/kg (Calculated) : 59.3 Heparin Dosing Weight: 75 kg  Vital Signs: BP: 148/92 (10/19 1430) Pulse Rate: 97 (10/19 1430)  Labs: Recent Labs    10/09/21 1734 10/09/21 1903 10/09/21 1903 10/10/21 0302 10/10/21 1058 10/10/21 1446 10/10/21 2125 10/11/21 0710 10/11/21 1214 10/11/21 1456  HGB 15.9*  --   --   --   --   --   --  17.0*  --   --   HCT 45.4  --   --   --   --   --   --  50.0*  --   --   PLT 258  --   --   --   --   --   --  285  --   --   APTT  --   --    < > 30 50*  --  65* 76*  --  67*  LABPROT  --   --   --  14.9  --   --   --   --   --   --   INR  --   --   --  1.2  --   --   --   --   --   --   HEPARINUNFRC  --   --   --  >1.10*  --   --   --  >1.10*  --   --   CREATININE QUANTITY NOT SUFFICIENT, UNABLE TO PERFORM TEST 0.84  --   --   --   --   --   --  0.78  --   TROPONINIHS 35* 716*  --  7,649* 2,798* 1,621*  --   --   --   --    < > = values in this interval not displayed.     Estimated Creatinine Clearance: 52.9 mL/min (by C-G formula based on SCr of 0.78 mg/dL).   Medical History: Past Medical History:  Diagnosis Date   Arrhythmia    Atrial flutter ([place]) 01/2018   new onset    Basal cell carcinoma of back    Basal cell carcinoma of lip    Cerebral aneurysm    followed by [facility]   CHF (congestive heart failure) (HCC)    DDD (degenerative disc disease), lumbar    superior plate depression, L3 08/18/2014   Diabetes mellitus type II, controlled ([place])    Diverticulosis    Dysrhythmia    Paroxysmal Supraventricular Tachycardia   Dysthymia    depression   GERD (gastroesophageal reflux disease)    History of meniscal tear    Hyperlipidemia    Hypertension    Hypothyroidism     Late onset Alzheimer's disease with behavioral disturbance (HCC)    Mild pulmonary hypertension (HCC)    Overactive bladder    Peripheral vascular disease (HCC)    Seasonal allergic rhinitis    Sleep apnea     Medications:  PTA Meds:  Eliquis 5 mg BID  Assessment: 87 yo female with h/o Afib on Eliquis, DM, HTN, hypothyroidism and dementia who was admitted for CP found with elevated troponin I, trending up 716 >> 7649. Pharmacy was consulted for heparin dosing. Pt on DOAC PTA.   Baseline HL noted to be >  1.10 secondary to Eliquis treatment   Date/time HL/aPTT Interpretation/change 10/18 1058 aPTT=50 Subthera at 950 u/hr 10/18 2125 aPTT=65 Subthera at 1200 u/hr 10/19 0710 aPTT=76 Thera at 1350 u/hr(HL>1.10, not correlating) 10/19 1456 aPTT=67 Thera at 1350   Goal of Therapy:  Heparin level 0.3-0.7 units/ml once aPTT and heparin level correlate.  aPTT 66-102 seconds Monitor platelets by anticoagulation protocol: Yes   Plan:  Heparin in borderline therapeutic, will increase infusion to 1450 units/hr Will recheck aPTT 8h after rate change  Daily CBC and HL while on heparin ggt   Narda Rutherford, PharmD Pharmacy Resident  10/11/2021 3:30 PM

## 2021-10-11 NOTE — Progress Notes (Signed)
ANTICOAGULATION CONSULT NOTE   Pharmacy Consult for heparin infusion Indication: ACS/STEMI  Allergies  Allergen Reactions   Pantoprazole Nausea And Vomiting   Metformin And Related Diarrhea    Patient Measurements: Height: 5\' 6"  (167.6 cm) Weight: 77.1 kg (170 lb) IBW/kg (Calculated) : 59.3 Heparin Dosing Weight: 75 kg  Vital Signs: BP: 148/91 (10/19 0700) Pulse Rate: 105 (10/19 0700)  Labs: Recent Labs    10/09/21 1734 10/09/21 1903 10/09/21 1903 10/10/21 0302 10/10/21 1058 10/10/21 1446 10/10/21 2125 10/11/21 0710  HGB 15.9*  --   --   --   --   --   --   --   HCT 45.4  --   --   --   --   --   --   --   PLT 258  --   --   --   --   --   --   --   APTT  --   --    < > 30 50*  --  65* 76*  LABPROT  --   --   --  14.9  --   --   --   --   INR  --   --   --  1.2  --   --   --   --   HEPARINUNFRC  --   --   --  >1.10*  --   --   --  >1.10*  CREATININE QUANTITY NOT SUFFICIENT, UNABLE TO PERFORM TEST 0.84  --   --   --   --   --   --   TROPONINIHS 35* 716*  --  7,649* 2,798* 1,621*  --   --    < > = values in this interval not displayed.     Estimated Creatinine Clearance: 50.4 mL/min (by C-G formula based on SCr of 0.84 mg/dL).   Medical History: Past Medical History:  Diagnosis Date   Arrhythmia    Atrial flutter ([place]) 01/2018   new onset    Basal cell carcinoma of back    Basal cell carcinoma of lip    Cerebral aneurysm    followed by [facility]   CHF (congestive heart failure) (HCC)    DDD (degenerative disc disease), lumbar    superior plate depression, L3 08/18/2014   Diabetes mellitus type II, controlled ([place])    Diverticulosis    Dysrhythmia    Paroxysmal Supraventricular Tachycardia   Dysthymia    depression   GERD (gastroesophageal reflux disease)    History of meniscal tear    Hyperlipidemia    Hypertension    Hypothyroidism    Late onset Alzheimer's disease with behavioral disturbance (HCC)    Mild pulmonary hypertension (HCC)    Overactive  bladder    Peripheral vascular disease (HCC)    Seasonal allergic rhinitis    Sleep apnea     Medications:  PTA Meds:  Eliquis 5 mg BID  Assessment: 87 yo female with h/o Afib on Eliquis, DM, HTN, hypothyroidism and dementia who was admitted for CP found with elevated troponin I, trending up 716 >> 7649. Pharmacy was consulted for heparin dosing. Pt on DOAC PTA.   Baseline HL noted to be >1.10 secondary to Eliquis treatment   Date/time HL/aPTT Interpretation/change 10/18 1058 aPTT=50 Subthera at 950 u/hr 10/18 2125 aPTT=65 Subthera at 1200 u/hr 10/19 0710 aPTT=76 Thera at 1350 u/hr(HL>1.10, not correlating)  Goal of Therapy:  Heparin level 0.3-0.7 units/ml once aPTT and heparin level correlate.  aPTT 66-102 seconds Monitor platelets by anticoagulation protocol: Yes   Plan:  aPTT is therapeutic. Will continue heparin infusion at 1350 units/hr. Recheck aPTT in 8 hours. Heparin level and CBC with AM labs. Switch to heparin level once heparin level and aPTT correlate.    Pearla Dubonnet, PharmD 10/11/2021 7:55 AM

## 2021-10-11 NOTE — ED Notes (Signed)
Pt alert   afib on monitor   purewick in place

## 2021-10-12 ENCOUNTER — Encounter
Admission: EM | Disposition: A | Discharge status: Skilled Nursing Facility | Payer: Self-pay | Source: Home / Self Care | Attending: Internal Medicine

## 2021-10-12 DIAGNOSIS — I10 Essential (primary) hypertension: Secondary | ICD-10-CM | POA: Diagnosis not present

## 2021-10-12 DIAGNOSIS — I480 Paroxysmal atrial fibrillation: Secondary | ICD-10-CM | POA: Diagnosis not present

## 2021-10-12 DIAGNOSIS — I214 Non-ST elevation (NSTEMI) myocardial infarction: Secondary | ICD-10-CM | POA: Diagnosis not present

## 2021-10-12 HISTORY — PX: LEFT HEART CATH AND CORONARY ANGIOGRAPHY: CATH118249

## 2021-10-12 LAB — APTT: aPTT: 76 seconds — ABNORMAL HIGH (ref 24–36)

## 2021-10-12 LAB — BASIC METABOLIC PANEL
Anion gap: 9 (ref 5–15)
BUN: 8 mg/dL (ref 8–23)
CO2: 23 mmol/L (ref 22–32)
Calcium: 8.7 mg/dL — ABNORMAL LOW (ref 8.9–10.3)
Chloride: 102 mmol/L (ref 98–111)
Creatinine, Ser: 0.54 mg/dL (ref 0.44–1.00)
GFR, Estimated: >60 mL/min (ref >60)
Glucose, Bld: 236 mg/dL — ABNORMAL HIGH (ref 70–99)
Potassium: 3.5 mmol/L (ref 3.5–5.1)
Sodium: 134 mmol/L — ABNORMAL LOW (ref 135–145)

## 2021-10-12 LAB — MAGNESIUM: Magnesium: 2.1 mg/dL (ref 1.7–2.4)

## 2021-10-12 LAB — CBC
HCT: 44.2 % (ref 36.0–46.0)
Hemoglobin: 15.5 g/dL — ABNORMAL HIGH (ref 12.0–15.0)
MCH: 31.6 pg (ref 26.0–34.0)
MCHC: 35.1 g/dL (ref 30.0–36.0)
MCV: 90.2 fL (ref 80.0–100.0)
Platelets: 256 10*3/uL (ref 150–400)
RBC: 4.9 MIL/uL (ref 3.87–5.11)
RDW: 14.2 % (ref 11.5–15.5)
WBC: 17.8 10*3/uL — ABNORMAL HIGH (ref 4.0–10.5)
nRBC: 0 % (ref 0.0–0.2)

## 2021-10-12 LAB — GLUCOSE, CAPILLARY
Glucose-Capillary: 204 mg/dL — ABNORMAL HIGH (ref 70–99)
Glucose-Capillary: 180 mg/dL — ABNORMAL HIGH (ref 70–99)
Glucose-Capillary: 310 mg/dL — ABNORMAL HIGH (ref 70–99)
Glucose-Capillary: 195 mg/dL — ABNORMAL HIGH (ref 70–99)

## 2021-10-12 LAB — HEPARIN LEVEL (UNFRACTIONATED): Heparin Unfractionated: 0.91 IU/mL — ABNORMAL HIGH (ref 0.30–0.70)

## 2021-10-12 SURGERY — LEFT HEART CATH AND CORONARY ANGIOGRAPHY
Anesthesia: Moderate Sedation

## 2021-10-12 MED ORDER — HYDRALAZINE HCL 20 MG/ML IJ SOLN: 10.0000 mg | INTRAMUSCULAR | Status: AC | PRN

## 2021-10-12 MED ORDER — ONDANSETRON HCL 4 MG/2ML IJ SOLN: 4.0000 mg | Freq: Four times a day (QID) | INTRAMUSCULAR | Status: DC | PRN

## 2021-10-12 MED ORDER — CALAMINE EX LOTN
1.0000 "application " | TOPICAL_LOTION | Freq: Two times a day (BID) | CUTANEOUS | Status: DC
  Administered 2021-10-12 – 2021-10-13 (×2): 1 via TOPICAL
  Filled 2021-10-12 (×2): qty 177

## 2021-10-12 MED ORDER — HEPARIN (PORCINE) IN NACL 1000-0.9 UT/500ML-% IV SOLN
INTRAVENOUS | Status: AC
  Filled 2021-10-12: qty 1000

## 2021-10-12 MED ORDER — VERAPAMIL HCL 2.5 MG/ML IV SOLN
INTRAVENOUS | Status: DC | PRN
  Administered 2021-10-12: 2.5 mg via INTRA_ARTERIAL

## 2021-10-12 MED ORDER — FENTANYL CITRATE (PF) 100 MCG/2ML IJ SOLN
INTRAMUSCULAR | Status: DC | PRN
  Administered 2021-10-12: 25 ug via INTRAVENOUS

## 2021-10-12 MED ORDER — VERAPAMIL HCL 2.5 MG/ML IV SOLN
INTRAVENOUS | Status: AC
  Filled 2021-10-12: qty 2

## 2021-10-12 MED ORDER — ASPIRIN 81 MG PO CHEW
81.0000 mg | CHEWABLE_TABLET | ORAL | Status: AC
  Administered 2021-10-12: 81 mg via ORAL

## 2021-10-12 MED ORDER — SODIUM CHLORIDE 0.9 % WEIGHT BASED INFUSION
3.0000 mL/kg/h | INTRAVENOUS | Status: DC
  Administered 2021-10-12: 3 mL/kg/h via INTRAVENOUS

## 2021-10-12 MED ORDER — SODIUM CHLORIDE 0.9 % IV SOLN: 250.0000 mL | INTRAVENOUS | Status: DC | PRN

## 2021-10-12 MED ORDER — HEPARIN SODIUM (PORCINE) 1000 UNIT/ML IJ SOLN
INTRAMUSCULAR | Status: DC | PRN
  Administered 2021-10-12: 4000 [IU] via INTRAVENOUS

## 2021-10-12 MED ORDER — LABETALOL HCL 5 MG/ML IV SOLN: 10.0000 mg | INTRAVENOUS | Status: AC | PRN

## 2021-10-12 MED ORDER — FENTANYL CITRATE (PF) 100 MCG/2ML IJ SOLN
INTRAMUSCULAR | Status: AC
  Filled 2021-10-12: qty 2

## 2021-10-12 MED ORDER — INSULIN ASPART 100 UNIT/ML IJ SOLN
0.0000 [IU] | Freq: Three times a day (TID) | INTRAMUSCULAR | Status: DC
  Administered 2021-10-12: 7 [IU] via SUBCUTANEOUS
  Administered 2021-10-13 (×3): 2 [IU] via SUBCUTANEOUS
  Administered 2021-10-14: 5 [IU] via SUBCUTANEOUS
  Filled 2021-10-12 (×5): qty 1

## 2021-10-12 MED ORDER — SODIUM CHLORIDE 0.9 % WEIGHT BASED INFUSION
1.0000 mL/kg/h | INTRAVENOUS | Status: DC
Start: 2021-10-13 — End: 2021-10-12
  Administered 2021-10-12: 1 mL/kg/h via INTRAVENOUS

## 2021-10-12 MED ORDER — ISOSORBIDE MONONITRATE ER 30 MG PO TB24
30.0000 mg | ORAL_TABLET | Freq: Every day | ORAL | Status: DC
  Administered 2021-10-12 – 2021-10-17 (×6): 30 mg via ORAL
  Filled 2021-10-12 (×6): qty 1

## 2021-10-12 MED ORDER — ACETAMINOPHEN 325 MG PO TABS
650.0000 mg | ORAL_TABLET | ORAL | Status: DC | PRN
  Administered 2021-10-13 – 2021-10-14 (×2): 650 mg via ORAL
  Filled 2021-10-12: qty 2

## 2021-10-12 MED ORDER — MIDAZOLAM HCL 2 MG/2ML IJ SOLN
INTRAMUSCULAR | Status: AC
  Filled 2021-10-12: qty 2

## 2021-10-12 MED ORDER — ATORVASTATIN CALCIUM 80 MG PO TABS
80.0000 mg | ORAL_TABLET | Freq: Every day | ORAL | Status: DC
  Administered 2021-10-12 – 2021-10-17 (×6): 80 mg via ORAL
  Filled 2021-10-12 (×6): qty 1

## 2021-10-12 MED ORDER — SODIUM CHLORIDE 0.9% FLUSH: 3.0000 mL | INTRAVENOUS | Status: DC | PRN

## 2021-10-12 MED ORDER — MIDAZOLAM HCL 2 MG/2ML IJ SOLN
INTRAMUSCULAR | Status: DC | PRN
  Administered 2021-10-12: 1 mg via INTRAVENOUS

## 2021-10-12 MED ORDER — DOCUSATE SODIUM 100 MG PO CAPS
200.0000 mg | ORAL_CAPSULE | Freq: Two times a day (BID) | ORAL | Status: DC
  Administered 2021-10-12 – 2021-10-17 (×9): 200 mg via ORAL
  Filled 2021-10-12 (×11): qty 2

## 2021-10-12 MED ORDER — LIDOCAINE HCL (PF) 1 % IJ SOLN
INTRAMUSCULAR | Status: DC | PRN
  Administered 2021-10-12: 2 mL

## 2021-10-12 MED ORDER — HEPARIN (PORCINE) IN NACL 1000-0.9 UT/500ML-% IV SOLN
INTRAVENOUS | Status: DC | PRN
  Administered 2021-10-12: 1000 mL

## 2021-10-12 MED ORDER — METOPROLOL TARTRATE 50 MG PO TABS
100.0000 mg | ORAL_TABLET | Freq: Two times a day (BID) | ORAL | Status: DC
  Administered 2021-10-12 – 2021-10-17 (×10): 100 mg via ORAL
  Filled 2021-10-12 (×10): qty 2

## 2021-10-12 MED ORDER — LISINOPRIL 10 MG PO TABS
5.0000 mg | ORAL_TABLET | Freq: Every day | ORAL | Status: DC
  Administered 2021-10-12 – 2021-10-17 (×6): 5 mg via ORAL
  Filled 2021-10-12 (×6): qty 1

## 2021-10-12 MED ORDER — IOHEXOL 350 MG/ML SOLN
INTRAVENOUS | Status: DC | PRN
  Administered 2021-10-12: 68 mL

## 2021-10-12 MED ORDER — SODIUM CHLORIDE 0.9 % WEIGHT BASED INFUSION
1.0000 mL/kg/h | INTRAVENOUS | Status: AC
  Administered 2021-10-12 (×2): 1 mL/kg/h via INTRAVENOUS

## 2021-10-12 MED ORDER — LIDOCAINE HCL 1 % IJ SOLN
INTRAMUSCULAR | Status: AC
  Filled 2021-10-12: qty 20

## 2021-10-12 MED ORDER — HEPARIN SODIUM (PORCINE) 1000 UNIT/ML IJ SOLN
INTRAMUSCULAR | Status: AC
  Filled 2021-10-12: qty 1

## 2021-10-12 MED ORDER — ASPIRIN 81 MG PO CHEW
CHEWABLE_TABLET | ORAL | Status: AC
  Filled 2021-10-12: qty 1

## 2021-10-12 SURGICAL SUPPLY — 12 items
CATH INFINITI 5 FR JL3.5 (CATHETERS) ×2 IMPLANT
CATH INFINITI JR4 5F (CATHETERS) ×2 IMPLANT
DEVICE RAD TR BAND REGULAR (VASCULAR PRODUCTS) ×2 IMPLANT
DRAPE BRACHIAL (DRAPES) ×2 IMPLANT
GLIDESHEATH SLEND SS 6F .021 (SHEATH) ×2 IMPLANT
GUIDEWIRE INQWIRE 1.5J.035X260 (WIRE) ×1 IMPLANT
INQWIRE 1.5J .035X260CM (WIRE) ×2
MARKER SKIN DUAL TIP RULER LAB (MISCELLANEOUS) ×2 IMPLANT
PACK CARDIAC CATH (CUSTOM PROCEDURE TRAY) ×2 IMPLANT
PROTECTION STATION PRESSURIZED (MISCELLANEOUS) ×2
SET ATX SIMPLICITY (MISCELLANEOUS) ×2 IMPLANT
STATION PROTECTION PRESSURIZED (MISCELLANEOUS) ×1 IMPLANT

## 2021-10-12 NOTE — Progress Notes (Addendum)
1655: Patient arrived from recovery (s/p heart cath) with two RN's at bedside. This RN assessed Right Wrist, access site, to find the area soft, radial and brachial pulses intact, and no swelling. Brisk Capillary Refill. Guaze and tegaderm, CDI. Per Vascular RN, TR band removed prior to transport. R arm propped on pillow.  1725: Cardiac Cath site re-assessed to find that site is still soft, radial and brachial pulses intact, no swelling. Color appropriate for ethnicity. Brisk Capillary Refill.Guaze and tegaderm, CDI. R arm elevation maintained.

## 2021-10-12 NOTE — ED Notes (Signed)
Labs drawn earlier to obtain Heparin level.

## 2021-10-12 NOTE — Progress Notes (Signed)
PROGRESS NOTE    Alison Richardson  ZJI:[id number] DOB: October 13, 1933 DOA: 10/10/2021 PCP: Rusty Aus, MD    Assessment & Plan:   Principal Problem:   NSTEMI (non-ST elevated myocardial infarction) [facility]) Active Problems:   Paroxysmal atrial fibrillation with rapid ventricular response (HCC)   Type 2 diabetes mellitus with diabetic neuropathy (HCC)   Cognitive impairment, mild, so stated   Benign essential hypertension   Hypothyroidism   Chronic diastolic CHF (congestive heart failure) ([place])   NSTEMI: w/ chest pain, elevated troponins. Continue on IV heparin drip & continue to hold eliquis. Continue on metoprolol, aspirin & statin. Nitro prn. Will go for cardiac cath today    PAF: w/ RVR. Continue on amiodarone, metoprolol. Continue on IV heparin & continue to hold home dose of eliquis   DM2: poorly controlled, HbA1c 8.5. Continue on SSI w/ accuchecks   HTN: continue on BB    Chronic diastolic CHF: continue on lasix, metoprolol. Monitor I/Os    Cognitive impairment: continue on home dose of donepezil    Hypothyroidism: continue on home dose of levothyroxine   Leukocytosis: likely reactive and labile   Hypokalemia: WNL today    DVT prophylaxis:  IV heparin  Code Status: full  Family Communication:  Disposition Plan: depends on PT/OT recs (not consulted yet)  Level of care: Progressive Cardiac  Status is: Inpatient  Remains inpatient appropriate because: will go for cardiac cath tomorrow     Consultants:  Cardio   Procedures:   Antimicrobials:    Subjective: Pt c/o malaise   Objective: Vitals:   10/12/21 0230 10/12/21 0300 10/12/21 0400 10/12/21 0701  BP:  127/77 132/78 (!) 143/74  Pulse: 87 82 93 81  Resp: 20 16 13  (!) 23  Temp:      TempSrc:      SpO2: 94% 92% 94% 93%  Weight:      Height:        Intake/Output Summary (Last 24 hours) at 10/12/2021 0834 Last data filed at 10/12/2021 0715 Gross per 24 hour  Intake 466.27 ml  Output --  Net  466.27 ml   Filed Weights   10/10/21 0220  Weight: 77.1 kg    Examination:  General exam: Appears comfortable  Respiratory system: Clear breath sounds b/l  Cardiovascular system: S1/S2+. No rubs or clicks  Gastrointestinal system: Abd is soft, NT,ND & hypoactive bowel sounds  Central nervous system: Alert and oriented. Moves all extremities  Psychiatry: Judgement and insight appear normal. Flat mood and affect    Data Reviewed: I have personally reviewed following labs and imaging studies  CBC: Recent Labs  Lab 10/09/21 1734 10/11/21 0710 10/12/21 0400  WBC 10.1 14.6* 17.8*  NEUTROABS 7.0  --   --   HGB 15.9* 17.0* 15.5*  HCT 45.4 50.0* 44.2  MCV 91.9 90.3 90.2  PLT 258 285 175   Basic Metabolic Panel: Recent Labs  Lab 10/09/21 1734 10/09/21 1903 10/11/21 1214 10/12/21 0400  NA 139  --  137 134*  K 4.0  --  3.4* 3.5  CL 105  --  102 102  CO2 20*  --  26 23  GLUCOSE 161*  --  242* 236*  BUN 16  --  10 8  CREATININE QUANTITY NOT SUFFICIENT, UNABLE TO PERFORM TEST 0.84 0.78 0.54  CALCIUM 9.0  --  8.7* 8.7*  MG  --   --   --  2.1   GFR: Estimated Creatinine Clearance: 52.9 mL/min (by C-G formula based on  SCr of 0.54 mg/dL). Liver Function Tests: Recent Labs  Lab 10/09/21 1734  AST 26  ALT 25  ALKPHOS 88  BILITOT 0.8  PROT 7.6  ALBUMIN 4.2   Recent Labs  Lab 10/09/21 1734  LIPASE 24   No results for input(s): AMMONIA in the last 168 hours. Coagulation Profile: Recent Labs  Lab 10/10/21 0302  INR 1.2   Cardiac Enzymes: No results for input(s): CKTOTAL, CKMB, CKMBINDEX, TROPONINI in the last 168 hours. BNP (last 3 results) No results for input(s): PROBNP in the last 8760 hours. HbA1C: No results for input(s): HGBA1C in the last 72 hours. CBG: Recent Labs  Lab 10/10/21 0608  GLUCAP 158*   Lipid Profile: No results for input(s): CHOL, HDL, LDLCALC, TRIG, CHOLHDL, LDLDIRECT in the last 72 hours. Thyroid Function Tests: No results for  input(s): TSH, T4TOTAL, FREET4, T3FREE, THYROIDAB in the last 72 hours. Anemia Panel: No results for input(s): VITAMINB12, FOLATE, FERRITIN, TIBC, IRON, RETICCTPCT in the last 72 hours. Sepsis Labs: No results for input(s): PROCALCITON, LATICACIDVEN in the last 168 hours.  Recent Results (from the past 240 hour(s))  Resp Panel by RT-PCR (Flu A&B, Covid) Nasopharyngeal Swab     Status: None   Collection Time: 10/10/21  3:02 AM   Specimen: Nasopharyngeal Swab; Nasopharyngeal(NP) swabs in vial transport medium  Result Value Ref Range Status   SARS Coronavirus 2 by RT PCR NEGATIVE NEGATIVE Final    Comment: (NOTE) SARS-CoV-2 target nucleic acids are NOT DETECTED.  The SARS-CoV-2 RNA is generally detectable in upper respiratory specimens during the acute phase of infection. The lowest concentration of SARS-CoV-2 viral copies this assay can detect is 138 copies/mL. A negative result does not preclude SARS-Cov-2 infection and should not be used as the sole basis for treatment or other patient management decisions. A negative result may occur with  improper specimen collection/handling, submission of specimen other than nasopharyngeal swab, presence of viral mutation(s) within the areas targeted by this assay, and inadequate number of viral copies(<138 copies/mL). A negative result must be combined with clinical observations, patient history, and epidemiological information. The expected result is Negative.  Fact Sheet for Patients:  EntrepreneurPulse.com.au  Fact Sheet for Healthcare Providers:  IncredibleEmployment.be  This test is no t yet approved or cleared by the [place] FDA and  has been authorized for detection and/or diagnosis of SARS-CoV-2 by FDA under an Emergency Use Authorization (EUA). This EUA will remain  in effect (meaning this test can be used) for the duration of the COVID-19 declaration under Section 564(b)(1) of the Act,  21 U.S.C.section 360bbb-3(b)(1), unless the authorization is terminated  or revoked sooner.       Influenza A by PCR NEGATIVE NEGATIVE Final   Influenza B by PCR NEGATIVE NEGATIVE Final    Comment: (NOTE) The Xpert Xpress SARS-CoV-2/FLU/RSV plus assay is intended as an aid in the diagnosis of influenza from Nasopharyngeal swab specimens and should not be used as a sole basis for treatment. Nasal washings and aspirates are unacceptable for Xpert Xpress SARS-CoV-2/FLU/RSV testing.  Fact Sheet for Patients: EntrepreneurPulse.com.au  Fact Sheet for Healthcare Providers: IncredibleEmployment.be  This test is not yet approved or cleared by the [place] FDA and has been authorized for detection and/or diagnosis of SARS-CoV-2 by FDA under an Emergency Use Authorization (EUA). This EUA will remain in effect (meaning this test can be used) for the duration of the COVID-19 declaration under Section 564(b)(1) of the Act, 21 U.S.C. section 360bbb-3(b)(1), unless the authorization is  terminated or revoked.  Performed at [facility], [address] [place], [place] [address]          Radiology Studies: No results found.      Scheduled Meds:  amiodarone  200 mg Oral Daily   donepezil  10 mg Oral QHS   furosemide  20 mg Oral Daily   levothyroxine  88 mcg Oral Daily   metoprolol tartrate  50 mg Oral BID   sodium chloride flush  3 mL Intravenous Q12H   Continuous Infusions:  diltiazem (CARDIZEM) infusion 15 mg/hr (10/12/21 0715)   heparin 1,450 Units/hr (10/12/21 0548)     LOS: 2 days    Time spent: 33 mins     Wyvonnia Dusky, MD [facility] Pager [phone]  If 7PM-7AM, please contact night-coverage 10/12/2021, 8:34 AM

## 2021-10-12 NOTE — Progress Notes (Signed)
[facility] Encounter Note  Patient: Alison Richardson / Admit Date: 10/10/2021 / Date of Encounter: 10/12/2021, 2:21 PM   Subjective: 87 y.o. female with a past medical history of atrial fibrillation on Eliquis and amiodarone, type 2 diabetes, hypertension, hyperlipidemia, hypothyroid who presented to the ED with complaints of chest discomfort occurring 10/17 at approximately 4 PM described as a pressure across the center of her chest radiating up into her jaw associated with nausea. Patient presented to the ED earlier in the day and had had some lab work done but had left and returned after her initial troponin came back elevated around 700. Troponin was initially trending upward to a peak of 7649 currently downtrending to 2798.  EKG currently shows atrial fibrillation at 114 bpm, she is anticoagulated with a heparin drip and took her last dose of Eliquis 10/17.  Overall patient has felt better since in the hospital.  Peak troponin 7649 consistent Non-ST elevation myocardial infarction.  Echocardiogram is pending.  Patient was placed on appropriate medication management with good heart rate control at this time including diltiazem drip and beta-blocker.  Cardiac catheterization showing mild LV systolic dysfunction with inferoapical hypokinesis ejection fraction of 40 to 45% Critical ostial right coronary artery stenosis with moderate atherosclerosis of distal artery and significant PDA stenosis consistent with severe diffuse right coronary artery atherosclerosis with good collaterals from left to right and not ideal for PCI and stent placement Left anterior descending artery and circumflex artery showing minimal atherosclerosis  Discussion with cardiovascular team suggesting medical management of severe atherosclerosis of right coronary artery would be most appropriate  Review of Systems: Positive ZOX:WRUE Negative for: Vision change, hearing change, syncope, dizziness,  nausea, vomiting,diarrhea, bloody stool, stomach pain, cough, congestion, diaphoresis, urinary frequency, urinary pain,skin lesions, skin rashes Others previously listed  Objective: Telemetry: atrial fibrillation with rapid ventricular rate Physical Exam: Blood pressure 115/71, pulse 81, temperature 97.9 F (36.6 C), temperature source Oral, resp. rate 18, height 5\' 6"  (1.676 m), weight 77.1 kg, SpO2 93 %. Body mass index is 27.44 kg/m. General: Well developed, well nourished, in no acute distress. Head: Normocephalic, atraumatic, sclera non-icteric, no xanthomas, nares are without discharge. Neck: No apparent masses Lungs: Normal respirations with no wheezes, no rhonchi, no rales , no crackles   Heart: irregular rate and rhythm, normal S1 S2, no murmur, no rub, no gallop, PMI is normal size and placement, carotid upstroke normal without bruit, jugular venous pressure normal Abdomen: Soft, non-tender, non-distended with normoactive bowel sounds. No hepatosplenomegaly. Abdominal aorta is normal size without bruit Extremities: No edema, no clubbing, no cyanosis, no ulcers,  Peripheral: 2+ radial, 2+ femoral, 2+ dorsal pedal pulses Neuro: Alert and oriented. Moves all extremities spontaneously. Psych:  Responds to questions appropriately with a normal affect.   Intake/Output Summary (Last 24 hours) at 10/12/2021 1421 Last data filed at 10/12/2021 0715 Gross per 24 hour  Intake 254.95 ml  Output --  Net 254.95 ml     Inpatient Medications:   [MAR Hold] amiodarone  200 mg Oral Daily   aspirin       [MAR Hold] calamine  1 application Topical BID   docusate sodium  200 mg Oral BID   [MAR Hold] donepezil  10 mg Oral QHS   [MAR Hold] furosemide  20 mg Oral Daily   insulin aspart  0-9 Units Subcutaneous TID WC   [MAR Hold] levothyroxine  88 mcg Oral Daily   metoprolol tartrate  100 mg Oral BID   [  MAR Hold] sodium chloride flush  3 mL Intravenous Q12H   Infusions:   sodium chloride      [START ON 10/13/2021] sodium chloride 3 mL/kg/hr (10/12/21 1300)   Followed by   Derrill Memo ON 10/13/2021] sodium chloride 1 mL/kg/hr (10/12/21 1301)   heparin Stopped (10/12/21 1252)    Labs: Recent Labs    10/11/21 1214 10/12/21 0400  NA 137 134*  K 3.4* 3.5  CL 102 102  CO2 26 23  GLUCOSE 242* 236*  BUN 10 8  CREATININE 0.78 0.54  CALCIUM 8.7* 8.7*  MG  --  2.1    Recent Labs    10/09/21 1734  AST 26  ALT 25  ALKPHOS 88  BILITOT 0.8  PROT 7.6  ALBUMIN 4.2    Recent Labs    10/09/21 1734 10/11/21 0710 10/12/21 0400  WBC 10.1 14.6* 17.8*  NEUTROABS 7.0  --   --   HGB 15.9* 17.0* 15.5*  HCT 45.4 50.0* 44.2  MCV 91.9 90.3 90.2  PLT 258 285 256    No results for input(s): CKTOTAL, CKMB, TROPONINI in the last 72 hours. Invalid input(s): POCBNP No results for input(s): HGBA1C in the last 72 hours.   Weights: Filed Weights   10/10/21 0220  Weight: 77.1 kg     Radiology/Studies:  DG Chest 2 View  Result Date: 10/09/2021 CLINICAL DATA:  Chest pain. EXAM: CHEST - 2 VIEW COMPARISON:  Chest x-ray dated October 06, 2020. FINDINGS: The heart size and mediastinal contours are within normal limits. Normal pulmonary vascularity. No focal consolidation, pleural effusion, or pneumothorax. No acute osseous abnormality. IMPRESSION: 1. No acute cardiopulmonary disease. Electronically Signed   By: Titus Dubin M.D.   On: 10/09/2021 18:36     Assessment and Recommendation  87 y.o. female with a past medical history of hypertension, hyperlipidemia, type 2 diabetes, family history of coronary artery disease, atrial fibrillation anticoagulated on Eliquis who presented with an episode of chest pain concerning for acute coronary syndrome with associated elevated troponin peaking around 7000 with resolution of chest discomfort and severe coronary artery atherosclerosis of right coronary artery with good collaterals to PDA from left  Plan: 1.  No further cardiac intervention  at this time due to diffuse one-vessel coronary atherosclerosis not ideal for PCI and stent placement with good collaterals to PDA from left side 2.  Single antiplatelet therapy including Plavix alone due to patient continuing on anticoagulation for atrial fibrillation 3.  Increase beta-blocker to 100 mg twice per day for better heart rate control with a goal heart rate resting 60 to 90 bpm of atrial fibrillation.  Will discontinue diltiazem drip 4.  Isosorbide for improved collateral blood flow and other hypertension treatment as needed 5.  Begin cardiac rehabilitation 6.  High intensity cholesterol therapy 7.  Begin ambulation and follow for improvements of symptoms and okay for discharge home to tomorrow morning after reinstatement of anticoagulation Signed, Jettie Booze, PA-C

## 2021-10-12 NOTE — ED Notes (Signed)
Pt awake.  Purewick in place.  Iv meds infusing.  Nsr on monitor.

## 2021-10-12 NOTE — Plan of Care (Signed)
  Problem: Health Behavior/Discharge Planning: Goal: Ability to manage health-related needs will improve Outcome: Progressing   Problem: Clinical Measurements: Goal: Respiratory complications will improve Outcome: Progressing   Problem: Clinical Measurements: Goal: Cardiovascular complication will be avoided Outcome: Progressing   Problem: Pain Managment: Goal: General experience of comfort will improve Outcome: Progressing   Problem: Safety: Goal: Ability to remain free from injury will improve Outcome: Progressing   

## 2021-10-12 NOTE — Progress Notes (Signed)
ANTICOAGULATION CONSULT NOTE   Pharmacy Consult for heparin infusion Indication: ACS/STEMI  Allergies  Allergen Reactions   Pantoprazole Nausea And Vomiting   Metformin And Related Diarrhea    Patient Measurements: Height: 5\' 6"  (167.6 cm) Weight: 77.1 kg (170 lb) IBW/kg (Calculated) : 59.3 Heparin Dosing Weight: 75 kg  Vital Signs: BP: 132/78 (10/20 0400) Pulse Rate: 93 (10/20 0400)  Labs: Recent Labs    10/09/21 1734 10/09/21 1903 10/09/21 1903 10/10/21 0302 10/10/21 1058 10/10/21 1446 10/10/21 2125 10/11/21 0710 10/11/21 1214 10/11/21 1456 10/12/21 0400  HGB 15.9*  --   --   --   --   --   --  17.0*  --   --  15.5*  HCT 45.4  --   --   --   --   --   --  50.0*  --   --  44.2  PLT 258  --   --   --   --   --   --  285  --   --  256  APTT  --   --    < > 30 50*  --    < > 76*  --  67* 76*  LABPROT  --   --   --  14.9  --   --   --   --   --   --   --   INR  --   --   --  1.2  --   --   --   --   --   --   --   HEPARINUNFRC  --   --   --  >1.10*  --   --   --  >1.10*  --   --  0.91*  CREATININE QUANTITY NOT SUFFICIENT, UNABLE TO PERFORM TEST 0.84  --   --   --   --   --   --  0.78  --  0.54  TROPONINIHS 35* 716*  --  7,649* 2,798* 1,621*  --   --   --   --   --    < > = values in this interval not displayed.     Estimated Creatinine Clearance: 52.9 mL/min (by C-G formula based on SCr of 0.54 mg/dL).   Medical History: Past Medical History:  Diagnosis Date   Arrhythmia    Atrial flutter ([place]) 01/2018   new onset    Basal cell carcinoma of back    Basal cell carcinoma of lip    Cerebral aneurysm    followed by [facility]   CHF (congestive heart failure) (HCC)    DDD (degenerative disc disease), lumbar    superior plate depression, L3 08/18/2014   Diabetes mellitus type II, controlled ([place])    Diverticulosis    Dysrhythmia    Paroxysmal Supraventricular Tachycardia   Dysthymia    depression   GERD (gastroesophageal reflux disease)    History of meniscal  tear    Hyperlipidemia    Hypertension    Hypothyroidism    Late onset Alzheimer's disease with behavioral disturbance (HCC)    Mild pulmonary hypertension (HCC)    Overactive bladder    Peripheral vascular disease (HCC)    Seasonal allergic rhinitis    Sleep apnea     Medications:  PTA Meds:  Eliquis 5 mg BID  Assessment: 87 yo female with h/o Afib on Eliquis, DM, HTN, hypothyroidism and dementia who was admitted for CP found with elevated troponin I, trending up  716 >> 7649. Pharmacy was consulted for heparin dosing. Pt on DOAC PTA.   Baseline HL noted to be >1.10 secondary to Eliquis treatment   Date/time HL/aPTT Interpretation/change 10/18 1058 aPTT=50 Subthera at 950 u/hr 10/18 2125 aPTT=65 Subthera at 1200 u/hr 10/19 0710 aPTT=76 Thera at 1350 u/hr(HL>1.10, not correlating) 10/19 1456 aPTT=67 Thera at 1350 10/20 0400 aPTT=76, HL 0.91, Thera x 2   Goal of Therapy:  Heparin level 0.3-0.7 units/ml once aPTT and heparin level correlate.  aPTT 66-102 seconds Monitor platelets by anticoagulation protocol: Yes   Plan:  Will continue heparin infusion at 1450 units/hr Will recheck aPTT daily with AM labs  Daily CBC and HL while on heparin ggt   Renda Rolls, PharmD, [facility] 10/12/2021 5:56 AM

## 2021-10-12 NOTE — ED Notes (Signed)
Patient repositioned in bed.  Pure Wick and adult brief changed.  Partial bed bath performed.  Patient provided with new gown and sheets

## 2021-10-13 ENCOUNTER — Encounter: Payer: Self-pay | Admitting: Internal Medicine

## 2021-10-13 ENCOUNTER — Inpatient Hospital Stay: Payer: Medicare PPO

## 2021-10-13 DIAGNOSIS — J3489 Other specified disorders of nose and nasal sinuses: Secondary | ICD-10-CM | POA: Diagnosis not present

## 2021-10-13 DIAGNOSIS — I480 Paroxysmal atrial fibrillation: Secondary | ICD-10-CM | POA: Diagnosis not present

## 2021-10-13 DIAGNOSIS — I214 Non-ST elevation (NSTEMI) myocardial infarction: Secondary | ICD-10-CM | POA: Diagnosis not present

## 2021-10-13 LAB — BASIC METABOLIC PANEL
Anion gap: 9 (ref 5–15)
BUN: 11 mg/dL (ref 8–23)
CO2: 23 mmol/L (ref 22–32)
Calcium: 8.4 mg/dL — ABNORMAL LOW (ref 8.9–10.3)
Chloride: 105 mmol/L (ref 98–111)
Creatinine, Ser: 0.58 mg/dL (ref 0.44–1.00)
GFR, Estimated: >60 mL/min (ref >60)
Glucose, Bld: 235 mg/dL — ABNORMAL HIGH (ref 70–99)
Potassium: 3.6 mmol/L (ref 3.5–5.1)
Sodium: 137 mmol/L (ref 135–145)

## 2021-10-13 LAB — GLUCOSE, CAPILLARY
Glucose-Capillary: 183 mg/dL — ABNORMAL HIGH (ref 70–99)
Glucose-Capillary: 183 mg/dL — ABNORMAL HIGH (ref 70–99)
Glucose-Capillary: 188 mg/dL — ABNORMAL HIGH (ref 70–99)
Glucose-Capillary: 214 mg/dL — ABNORMAL HIGH (ref 70–99)

## 2021-10-13 LAB — CBC
HCT: 39.9 % (ref 36.0–46.0)
Hemoglobin: 13.4 g/dL (ref 12.0–15.0)
MCH: 30.5 pg (ref 26.0–34.0)
MCHC: 33.6 g/dL (ref 30.0–36.0)
MCV: 90.7 fL (ref 80.0–100.0)
Platelets: 236 10*3/uL (ref 150–400)
RBC: 4.4 MIL/uL (ref 3.87–5.11)
RDW: 14.5 % (ref 11.5–15.5)
WBC: 13.5 10*3/uL — ABNORMAL HIGH (ref 4.0–10.5)
nRBC: 0 % (ref 0.0–0.2)

## 2021-10-13 LAB — APTT: aPTT: 27 seconds (ref 24–36)

## 2021-10-13 LAB — HEPARIN LEVEL (UNFRACTIONATED): Heparin Unfractionated: 0.2 IU/mL — ABNORMAL LOW (ref 0.30–0.70)

## 2021-10-13 LAB — MAGNESIUM: Magnesium: 1.9 mg/dL (ref 1.7–2.4)

## 2021-10-13 MED ORDER — HEPARIN BOLUS VIA INFUSION
1100.0000 [IU] | Freq: Once | INTRAVENOUS | Status: DC
  Filled 2021-10-13: qty 1100

## 2021-10-13 MED ORDER — CETAPHIL MOISTURIZING EX LOTN
TOPICAL_LOTION | Freq: Two times a day (BID) | CUTANEOUS | Status: DC
  Filled 2021-10-13: qty 473

## 2021-10-13 MED ORDER — IOHEXOL 300 MG/ML  SOLN
75.0000 mL | Freq: Once | INTRAMUSCULAR | Status: AC | PRN
  Administered 2021-10-13: 75 mL via INTRAVENOUS

## 2021-10-13 MED ORDER — CLINDAMYCIN PHOSPHATE 600 MG/50ML IV SOLN
600.0000 mg | Freq: Three times a day (TID) | INTRAVENOUS | Status: DC
  Administered 2021-10-13 – 2021-10-17 (×11): 600 mg via INTRAVENOUS
  Filled 2021-10-13 (×14): qty 50

## 2021-10-13 MED ORDER — CLOPIDOGREL BISULFATE 75 MG PO TABS
75.0000 mg | ORAL_TABLET | Freq: Every day | ORAL | Status: DC
  Administered 2021-10-13 – 2021-10-17 (×5): 75 mg via ORAL
  Filled 2021-10-13 (×5): qty 1

## 2021-10-13 MED ORDER — APIXABAN 5 MG PO TABS
5.0000 mg | ORAL_TABLET | Freq: Two times a day (BID) | ORAL | Status: DC
  Administered 2021-10-13 – 2021-10-17 (×9): 5 mg via ORAL
  Filled 2021-10-13 (×9): qty 1

## 2021-10-13 MED ORDER — GABAPENTIN 100 MG PO CAPS
100.0000 mg | ORAL_CAPSULE | Freq: Every evening | ORAL | Status: DC | PRN
  Administered 2021-10-13: 100 mg via ORAL
  Filled 2021-10-13: qty 1

## 2021-10-13 MED ORDER — SODIUM CHLORIDE 0.9 % IV SOLN
3.0000 g | Freq: Three times a day (TID) | INTRAVENOUS | Status: DC
  Filled 2021-10-13 (×3): qty 8

## 2021-10-13 MED ORDER — SODIUM CHLORIDE 0.9 % IV SOLN
100.0000 mg | Freq: Two times a day (BID) | INTRAVENOUS | Status: DC
  Filled 2021-10-13 (×2): qty 100

## 2021-10-13 NOTE — Care Management Important Message (Signed)
Important Message  Patient Details  Name: Alison Richardson MRN: [id number] Date of Birth: May 03, 1933   Medicare Important Message Given:  Yes     Dannette Barbara 10/13/2021, 1:11 PM

## 2021-10-13 NOTE — Progress Notes (Signed)
[facility] Encounter Note  Patient: Alison Richardson / Admit Date: 10/10/2021 / Date of Encounter: 10/13/2021, 8:51 AM   Subjective: 87 y.o. female with a past medical history of atrial fibrillation on Eliquis and amiodarone, type 2 diabetes, hypertension, hyperlipidemia, hypothyroid who presented to the ED with complaints of chest discomfort occurring 10/17 at approximately 4 PM described as a pressure across the center of her chest radiating up into her jaw associated with nausea. Patient presented to the ED earlier in the day and had had some lab work done but had left and returned after her initial troponin came back elevated around 700. Troponin was initially trending upward to a peak of 7649 currently downtrending to 2798.  EKG currently shows atrial fibrillation at 114 bpm, she is anticoagulated with a heparin drip and took her last dose of Eliquis 10/17.  Overall patient has felt better since in the hospital.  Peak troponin 7649 consistent Non-ST elevation myocardial infarction.  Echocardiogram is pending.  Patient was placed on appropriate medication management with good heart rate control at this time including diltiazem drip and beta-blocker.  Cardiac catheterization showing mild LV systolic dysfunction with inferoapical hypokinesis ejection fraction of 40 to 45% Critical ostial right coronary artery stenosis with moderate atherosclerosis of distal artery and significant PDA stenosis consistent with severe diffuse right coronary artery atherosclerosis with good collaterals from left to right and not ideal for PCI and stent placement Left anterior descending artery and circumflex artery showing minimal atherosclerosis  Discussion with cardiovascular team suggesting medical management of severe atherosclerosis of right coronary artery would be most appropriate  Review of Systems: Positive PYP:[id number] Negative for: Vision change, hearing change, syncope, dizziness,  nausea, vomiting,diarrhea, bloody stool, stomach pain, cough, congestion, diaphoresis, urinary frequency, urinary pain,skin lesions, skin rashes Others previously listed  Objective: Telemetry: atrial fibrillation with rapid ventricular rate Physical Exam: Blood pressure 118/74, pulse 98, temperature 98.4 F (36.9 C), temperature source Oral, resp. rate 16, height 5\' 6"  (1.676 m), weight 77.1 kg, SpO2 93 %. Body mass index is 27.44 kg/m. General: Well developed, well nourished, in no acute distress. Head: Normocephalic, atraumatic, sclera non-icteric, no xanthomas, nares are without discharge. Neck: No apparent masses Lungs: Normal respirations with no wheezes, no rhonchi, no rales , no crackles   Heart: irregular rate and rhythm, normal S1 S2, no murmur, no rub, no gallop, PMI is normal size and placement, carotid upstroke normal without bruit, jugular venous pressure normal Abdomen: Soft, non-tender, non-distended with normoactive bowel sounds. No hepatosplenomegaly. Abdominal aorta is normal size without bruit Extremities: No edema, no clubbing, no cyanosis, no ulcers,  Peripheral: 2+ radial, 2+ femoral, 2+ dorsal pedal pulses Neuro: Alert and oriented. Moves all extremities spontaneously. Psych:  Responds to questions appropriately with a normal affect.   Intake/Output Summary (Last 24 hours) at 10/13/2021 0851 Last data filed at 10/13/2021 0300 Gross per 24 hour  Intake 3463.64 ml  Output 850 ml  Net 2613.64 ml     Inpatient Medications:   amiodarone  200 mg Oral Daily   atorvastatin  80 mg Oral Daily   calamine  1 application Topical BID   docusate sodium  200 mg Oral BID   donepezil  10 mg Oral QHS   furosemide  20 mg Oral Daily   insulin aspart  0-9 Units Subcutaneous TID WC   isosorbide mononitrate  30 mg Oral Daily   levothyroxine  88 mcg Oral Daily   lisinopril  5 mg Oral Daily  metoprolol tartrate  100 mg Oral BID   sodium chloride flush  3 mL Intravenous Q12H    Infusions:     Labs: Recent Labs    10/12/21 0400 10/13/21 0518  NA 134* 137  K 3.5 3.6  CL 102 105  CO2 23 23  GLUCOSE 236* 235*  BUN 8 11  CREATININE 0.54 0.58  CALCIUM 8.7* 8.4*  MG 2.1 1.9    No results for input(s): AST, ALT, ALKPHOS, BILITOT, PROT, ALBUMIN in the last 72 hours.  Recent Labs    10/12/21 0400 10/13/21 0518  WBC 17.8* 13.5*  HGB 15.5* 13.4  HCT 44.2 39.9  MCV 90.2 90.7  PLT 256 236    No results for input(s): CKTOTAL, CKMB, TROPONINI in the last 72 hours. Invalid input(s): POCBNP No results for input(s): HGBA1C in the last 72 hours.   Weights: Filed Weights   10/10/21 0220  Weight: 77.1 kg     Radiology/Studies:  DG Chest 2 View  Result Date: 10/09/2021 CLINICAL DATA:  Chest pain. EXAM: CHEST - 2 VIEW COMPARISON:  Chest x-ray dated October 06, 2020. FINDINGS: The heart size and mediastinal contours are within normal limits. Normal pulmonary vascularity. No focal consolidation, pleural effusion, or pneumothorax. No acute osseous abnormality. IMPRESSION: 1. No acute cardiopulmonary disease. Electronically Signed   By: Titus Dubin M.D.   On: 10/09/2021 18:36   CARDIAC CATHETERIZATION  Result Date: 10/12/2021   Ost RCA to Prox RCA lesion is 99% stenosed.   Dist RCA lesion is 65% stenosed.   RPDA lesion is 60% stenosed.   1st Diag lesion is 99% stenosed.   Ost Cx to Prox Cx lesion is 25% stenosed.   Ost LAD to Prox LAD lesion is 25% stenosed.   There is mild left ventricular systolic dysfunction.   LV end diastolic pressure is mildly elevated.   The left ventricular ejection fraction is 35-45% by visual estimate. 87 year old female with known chronic nonvalvular atrial fibrillation hypertension hyperlipidemia with acute non-ST elevation myocardial infarction Cardiac catheterization showing mild inferior hypokinesis and apical hypokinesis with ejection fraction of 40 to 45% Severe and/or critical stenosis of ostial right coronary artery and  moderate atherosclerosis of distal right coronary artery and PDA complex in nature with good collaterals from left anterior descending artery Mild atherosclerosis of left anterior descending artery and circumflex artery After discussion with cardiovascular team would best be medically manage at this time due to complex lesions and not ideal for PCI and stent placement Plan Isosorbide for better collateral flow 2.  Increase beta-blocker for better heart rate control of atrial fibrillation with a goal heart rate between 60 and 90 bpm 3.  Reinstate anticoagulation for further risk reduction and stroke with atrial fibrillation 4.  Single antiplatelet therapy with Plavix 5.  Hypertension control and high intensity cholesterol therapy 6.  Cardiac rehabilitation     Assessment and Recommendation  87 y.o. female with a past medical history of hypertension, hyperlipidemia, type 2 diabetes, family history of coronary artery disease, atrial fibrillation anticoagulated on Eliquis who presented with an episode of chest pain concerning for acute coronary syndrome with associated elevated troponin peaking around 7000 with resolution of chest discomfort and severe coronary artery atherosclerosis of right coronary artery with good collaterals to PDA from left  Plan: 1.  No further cardiac intervention at this time due to diffuse one-vessel coronary atherosclerosis not ideal for PCI and stent placement with good collaterals to PDA from left side 2.  Single antiplatelet  therapy including Plavix alone due to patient continuing on anticoagulation for atrial fibrillation, will restart eliquis today 3.  Continue beta-blocker at 100 mg twice per day for better heart rate control with a goal heart rate resting 60 to 90 bpm of atrial fibrillation. 4.  Isosorbide for improved collateral blood flow and other hypertension treatment as needed 5.  Begin cardiac rehabilitation 6.  High intensity cholesterol therapy 7.  Begin ambulation  and follow for improvements of symptoms  8.  Patient is currently stable from a cardiovascular standpoint and ready for discharge. Plan to follow up as an outpatient in 1-2 weeks.   Signed, Jettie Booze, PA-C

## 2021-10-13 NOTE — Progress Notes (Addendum)
PROGRESS NOTE    Alison Richardson  WJX:[id number] DOB: 11-05-1933 DOA: 10/10/2021 PCP: Rusty Aus, MD    Assessment & Plan:   Principal Problem:   NSTEMI (non-ST elevated myocardial infarction) [facility]) Active Problems:   Paroxysmal atrial fibrillation with rapid ventricular response (HCC)   Type 2 diabetes mellitus with diabetic neuropathy (HCC)   Cognitive impairment, mild, so stated   Benign essential hypertension   Hypothyroidism   Chronic diastolic CHF (congestive heart failure) ([place])   NSTEMI: w/ chest pain, elevated troponins. S/p cardiac cath which showed diffuse one vessel coronary atherosclerosis as per cardio. Started on plavix, statin as per cardio    PAF: w/ RVR. Continue on amiodarone, metoprolol. D/c heparin and restarted eliquis   Nasal abscess: started on IV unasyn, doxy. MRSA screen ordered. ENT consulted     MCA aneurysm: 12 mm & incidental finding as per CT. Discussed w/ Dr. Lacinda Axon, neuro surg, and pt can f/u outpatient. Not a good surgical candidate and not a high risk of rupture as per neuro surg    Generalized weakness: PT/OT recs SNF   DM2: HbA1c 8.5, poorly controlled. Continue on SSI w/ accuchecks    HTN: continue on metoprolol    Chronic diastolic CHF: continue on lasix, metoprolol. Monitor I/Os    Cognitive impairment: continue on home dose of donepezil    Hypothyroidism: continue on home dose of levothyroxine   Leukocytosis: likely reactive and labile   Hypokalemia: WNL today    DVT prophylaxis: eliquis  Code Status: full  Family Communication: discussed pt's care w/ pt's son and answered his questions  Disposition Plan:  PT/OT recs SNF   Level of care: Progressive Cardiac  Status is: Inpatient  Remains inpatient appropriate because: will go for cardiac cath tomorrow     Consultants:  Cardio   Procedures:   Antimicrobials:    Subjective: Pt c/o nose pain   Objective: Vitals:   10/12/21 1657 10/12/21 1927 10/13/21 0026  10/13/21 0359  BP: (!) 97/54 117/64 115/75 114/70  Pulse: 74 86 97 99  Resp: 18 18 18 18   Temp: 98 F (36.7 C) 98.4 F (36.9 C) 98.8 F (37.1 C) 98.4 F (36.9 C)  TempSrc:      SpO2: 96% 95% 96% 93%  Weight:      Height:        Intake/Output Summary (Last 24 hours) at 10/13/2021 0759 Last data filed at 10/13/2021 0300 Gross per 24 hour  Intake 3463.64 ml  Output 850 ml  Net 2613.64 ml   Filed Weights   10/10/21 0220  Weight: 77.1 kg    Examination:  General exam: Appears calm & comfortable  Respiratory system: clear breath sounds b/l  Cardiovascular system: S1 & S2+. No rubs or clicks  Gastrointestinal system: Abd is soft, NT, ND & hypoactive bowel sounds  Central nervous system: Alert and oriented. Moves all extremities  Psychiatry: Judgement and insight appear normal. Flat mood and affect    Data Reviewed: I have personally reviewed following labs and imaging studies  CBC: Recent Labs  Lab 10/09/21 1734 10/11/21 0710 10/12/21 0400 10/13/21 0518  WBC 10.1 14.6* 17.8* 13.5*  NEUTROABS 7.0  --   --   --   HGB 15.9* 17.0* 15.5* 13.4  HCT 45.4 50.0* 44.2 39.9  MCV 91.9 90.3 90.2 90.7  PLT 258 285 256 213   Basic Metabolic Panel: Recent Labs  Lab 10/09/21 1734 10/09/21 1903 10/11/21 1214 10/12/21 0400 10/13/21 0518  NA 139  --  137 134* 137  K 4.0  --  3.4* 3.5 3.6  CL 105  --  102 102 105  CO2 20*  --  26 23 23   GLUCOSE 161*  --  242* 236* 235*  BUN 16  --  10 8 11   CREATININE QUANTITY NOT SUFFICIENT, UNABLE TO PERFORM TEST 0.84 0.78 0.54 0.58  CALCIUM 9.0  --  8.7* 8.7* 8.4*  MG  --   --   --  2.1 1.9   GFR: Estimated Creatinine Clearance: 52.9 mL/min (by C-G formula based on SCr of 0.58 mg/dL). Liver Function Tests: Recent Labs  Lab 10/09/21 1734  AST 26  ALT 25  ALKPHOS 88  BILITOT 0.8  PROT 7.6  ALBUMIN 4.2   Recent Labs  Lab 10/09/21 1734  LIPASE 24   No results for input(s): AMMONIA in the last 168 hours. Coagulation  Profile: Recent Labs  Lab 10/10/21 0302  INR 1.2   Cardiac Enzymes: No results for input(s): CKTOTAL, CKMB, CKMBINDEX, TROPONINI in the last 168 hours. BNP (last 3 results) No results for input(s): PROBNP in the last 8760 hours. HbA1C: No results for input(s): HGBA1C in the last 72 hours. CBG: Recent Labs  Lab 10/10/21 0608 10/12/21 1259 10/12/21 1451 10/12/21 1659 10/12/21 2032  GLUCAP 158* 204* 180* 310* 195*   Lipid Profile: No results for input(s): CHOL, HDL, LDLCALC, TRIG, CHOLHDL, LDLDIRECT in the last 72 hours. Thyroid Function Tests: No results for input(s): TSH, T4TOTAL, FREET4, T3FREE, THYROIDAB in the last 72 hours. Anemia Panel: No results for input(s): VITAMINB12, FOLATE, FERRITIN, TIBC, IRON, RETICCTPCT in the last 72 hours. Sepsis Labs: No results for input(s): PROCALCITON, LATICACIDVEN in the last 168 hours.  Recent Results (from the past 240 hour(s))  Resp Panel by RT-PCR (Flu A&B, Covid) Nasopharyngeal Swab     Status: None   Collection Time: 10/10/21  3:02 AM   Specimen: Nasopharyngeal Swab; Nasopharyngeal(NP) swabs in vial transport medium  Result Value Ref Range Status   SARS Coronavirus 2 by RT PCR NEGATIVE NEGATIVE Final    Comment: (NOTE) SARS-CoV-2 target nucleic acids are NOT DETECTED.  The SARS-CoV-2 RNA is generally detectable in upper respiratory specimens during the acute phase of infection. The lowest concentration of SARS-CoV-2 viral copies this assay can detect is 138 copies/mL. A negative result does not preclude SARS-Cov-2 infection and should not be used as the sole basis for treatment or other patient management decisions. A negative result may occur with  improper specimen collection/handling, submission of specimen other than nasopharyngeal swab, presence of viral mutation(s) within the areas targeted by this assay, and inadequate number of viral copies(<138 copies/mL). A negative result must be combined with clinical  observations, patient history, and epidemiological information. The expected result is Negative.  Fact Sheet for Patients:  EntrepreneurPulse.com.au  Fact Sheet for Healthcare Providers:  IncredibleEmployment.be  This test is no t yet approved or cleared by the [place] FDA and  has been authorized for detection and/or diagnosis of SARS-CoV-2 by FDA under an Emergency Use Authorization (EUA). This EUA will remain  in effect (meaning this test can be used) for the duration of the COVID-19 declaration under Section 564(b)(1) of the Act, 21 U.S.C.section 360bbb-3(b)(1), unless the authorization is terminated  or revoked sooner.       Influenza A by PCR NEGATIVE NEGATIVE Final   Influenza B by PCR NEGATIVE NEGATIVE Final    Comment: (NOTE) The Xpert Xpress SARS-CoV-2/FLU/RSV plus assay is intended as an aid in  the diagnosis of influenza from Nasopharyngeal swab specimens and should not be used as a sole basis for treatment. Nasal washings and aspirates are unacceptable for Xpert Xpress SARS-CoV-2/FLU/RSV testing.  Fact Sheet for Patients: EntrepreneurPulse.com.au  Fact Sheet for Healthcare Providers: IncredibleEmployment.be  This test is not yet approved or cleared by the [place] FDA and has been authorized for detection and/or diagnosis of SARS-CoV-2 by FDA under an Emergency Use Authorization (EUA). This EUA will remain in effect (meaning this test can be used) for the duration of the COVID-19 declaration under Section 564(b)(1) of the Act, 21 U.S.C. section 360bbb-3(b)(1), unless the authorization is terminated or revoked.  Performed at [facility], [address] [place], [place] [address]          Radiology Studies: CARDIAC CATHETERIZATION  Result Date: 10/12/2021   Ost RCA to Prox RCA lesion is 99% stenosed.   Dist RCA lesion is 65% stenosed.   RPDA lesion is 60%  stenosed.   1st Diag lesion is 99% stenosed.   Ost Cx to Prox Cx lesion is 25% stenosed.   Ost LAD to Prox LAD lesion is 25% stenosed.   There is mild left ventricular systolic dysfunction.   LV end diastolic pressure is mildly elevated.   The left ventricular ejection fraction is 35-45% by visual estimate. 87 year old female with known chronic nonvalvular atrial fibrillation hypertension hyperlipidemia with acute non-ST elevation myocardial infarction Cardiac catheterization showing mild inferior hypokinesis and apical hypokinesis with ejection fraction of 40 to 45% Severe and/or critical stenosis of ostial right coronary artery and moderate atherosclerosis of distal right coronary artery and PDA complex in nature with good collaterals from left anterior descending artery Mild atherosclerosis of left anterior descending artery and circumflex artery After discussion with cardiovascular team would best be medically manage at this time due to complex lesions and not ideal for PCI and stent placement Plan Isosorbide for better collateral flow 2.  Increase beta-blocker for better heart rate control of atrial fibrillation with a goal heart rate between 60 and 90 bpm 3.  Reinstate anticoagulation for further risk reduction and stroke with atrial fibrillation 4.  Single antiplatelet therapy with Plavix 5.  Hypertension control and high intensity cholesterol therapy 6.  Cardiac rehabilitation        Scheduled Meds:  amiodarone  200 mg Oral Daily   atorvastatin  80 mg Oral Daily   calamine  1 application Topical BID   docusate sodium  200 mg Oral BID   donepezil  10 mg Oral QHS   furosemide  20 mg Oral Daily   insulin aspart  0-9 Units Subcutaneous TID WC   isosorbide mononitrate  30 mg Oral Daily   levothyroxine  88 mcg Oral Daily   lisinopril  5 mg Oral Daily   metoprolol tartrate  100 mg Oral BID   sodium chloride flush  3 mL Intravenous Q12H   Continuous Infusions:     LOS: 3 days    Time  spent: 30 mins     Wyvonnia Dusky, MD [facility] Pager [phone]  If 7PM-7AM, please contact night-coverage 10/13/2021, 7:59 AM

## 2021-10-13 NOTE — Evaluation (Signed)
Occupational Therapy Evaluation Patient Details Name: Alison Richardson MRN: [id number] DOB: [birth year] Today's Date: 10/13/2021   History of Present Illness 87 y.o. female with a past medical history of atrial fibrillation on Eliquis and amiodarone, type 2 diabetes, hypertension, hyperlipidemia, hypothyroid who presented to the ED with complaints of chest discomfort occurring 10/17 at approximately 4 PM. Peak troponin 7649 consistent Non-ST elevation myocardial infarction. S/p Cardiac catheterization 10/12/21.   Clinical Impression   Alison Richardson was seen for OT evaluation this date. Prior to hospital admission, pt was Independent for mobility and I/ADLs including driving, reports using SPC/4WW as needed 2/2 recent foot pain. Pt lives alone at [place]. Pt presents to acute OT demonstrating impaired ADL performance and functional mobility 2/2 decreased activity tolerance and functional strength/balance deficits. Pt currently requires MIN A + RW for toilet t/f - assist from low height toilet. SUPERVISION perihygiene with lateral leans. CGA + RW for hand washing standing sinkside - 1 eopside of bilateral leg buckling noted, pt able to correct however limited standing tolerance ~2 min. CGA + significantly increasd time don mesh underwear. Pt would benefit from skilled OT to address noted impairments and functional limitations (see below for any additional details) in order to maximize safety and independence while minimizing falls risk and caregiver burden. Upon hospital discharge, recommend STR to maximize pt safety and return to PLOF 2/2 decreased caregiver support.       Recommendations for follow up therapy are one component of a multi-disciplinary discharge planning process, led by the attending physician.  Recommendations may be updated based on patient status, additional functional criteria and insurance authorization.   Follow Up Recommendations  SNF    Equipment Recommendations   None recommended by OT    Recommendations for Other Services       Precautions / Restrictions Precautions Precautions: Fall;Other (comment) Restrictions Weight Bearing Restrictions: No Other Position/Activity Restrictions: RUE treated like NWB per protocol s/p cardiac cath initial 48 hours (started 10/12/21 1445)      Mobility Bed Mobility Overal bed mobility: Needs Assistance Bed Mobility: Supine to Sit     Supine to sit: Min assist Sit to supine: Modified independent (Device/Increase time)   General bed mobility comments: no physcial assist but MIN cues and increased time to perform, CGA    Transfers Overall transfer level: Needs assistance Equipment used: Rolling walker (2 wheeled) Transfers: Sit to/from Stand Sit to Stand: Min assist         General transfer comment: L grab bar + MIN A from commode height    Balance Overall balance assessment: Needs assistance Sitting-balance support: No upper extremity supported;Feet supported Sitting balance-Leahy Scale: Fair     Standing balance support: No upper extremity supported;During functional activity Standing balance-Leahy Scale: Fair Standing balance comment: benefitting from B UE support at this time due to weak legs                           ADL either performed or assessed with clinical judgement   ADL Overall ADL's : Needs assistance/impaired                                       General ADL Comments: MIN A + RW for toilet t/f - assist from low height toilet. SUPERVISION perihygiene with lateral leans. CGA + RW for hand washing standing sinkside - 1  eopside of bilateral leg buckling noted, pt able to correct however limited standing tolerance ~2 min. CGA + significantly increasd time don mesh underwear and elevated chair height.      Pertinent Vitals/Pain Pain Assessment: No/denies pain     Hand Dominance Right   Extremity/Trunk Assessment Upper Extremity Assessment Upper  Extremity Assessment: Generalized weakness   Lower Extremity Assessment Lower Extremity Assessment: Generalized weakness   Cervical / Trunk Assessment Cervical / Trunk Assessment: Normal   Communication Communication Communication: HOH   Cognition Arousal/Alertness: Awake/alert Behavior During Therapy: WFL for tasks assessed/performed Overall Cognitive Status: Within Functional Limits for tasks assessed                                     General Comments  HR 110s with exertion    Exercises Exercises: Other exercises Other Exercises Other Exercises: Pt educated re: OT role, DME recs, d/c recs, falls prevention, ECS Other Exercises: LBD, toileting, hand wahsing, sup>sit, sit<>stand, sitting/standing balance/tolerance, ~40 ft mobility   Shoulder Instructions      Home Living Family/patient expects to be discharged to:: Private residence Living Arrangements: Alone Available Help at Discharge: Neighbor;Available PRN/intermittently Type of Home: Independent living facility Home Access: Elevator     Home Layout: One level     Bathroom Shower/Tub: Occupational psychologist: Handicapped height Bathroom Accessibility: Yes   Home Equipment: Environmental consultant - 4 wheels;Shower seat;Grab bars - tub/shower;Grab bars - toilet   Additional Comments: [facility]      Prior Functioning/Environment Level of Independence: Independent with assistive device(s)        Comments: MOD I for I/ADLs and mobility using SPC/4WW as needed. Pt drives        OT Problem List: Decreased strength;Decreased activity tolerance;Impaired balance (sitting and/or standing);Decreased safety awareness      OT Treatment/Interventions: Self-care/ADL training;Therapeutic exercise;Energy conservation;DME and/or AE instruction;Therapeutic activities;Patient/family education;Balance training    OT Goals(Current goals can be found in the care plan section) Acute Rehab OT  Goals Patient Stated Goal: to go to rehab then home OT Goal Formulation: With patient/family Time For Goal Achievement: 10/27/21 Potential to Achieve Goals: Good ADL Goals Pt Will Perform Grooming: with modified independence;standing (c LRAD PRN will tolerate >5 min grooming tasks) Pt Will Perform Lower Body Dressing: Independently;sit to/from stand Pt Will Transfer to Toilet: with modified independence;ambulating;regular height toilet (c LRAD PRN)  OT Frequency: Min 1X/week   Barriers to D/C: Decreased caregiver support             AM-PAC OT "6 Clicks" Daily Activity     Outcome Measure Help from another person eating meals?: A Little Help from another person taking care of personal grooming?: A Little Help from another person toileting, which includes using toliet, bedpan, or urinal?: A Little Help from another person bathing (including washing, rinsing, drying)?: A Little Help from another person to put on and taking off regular upper body clothing?: A Little Help from another person to put on and taking off regular lower body clothing?: A Little 6 Click Score: 18   End of Session Equipment Utilized During Treatment: Rolling walker Nurse Communication: Mobility status  Activity Tolerance: Patient tolerated treatment well Patient left: with call bell/phone within reach;in chair;with chair alarm set;with family/visitor present  OT Visit Diagnosis: Other abnormalities of gait and mobility (R26.89)  Time: 3817-7116 OT Time Calculation (min): 34 min Charges:  OT General Charges $OT Visit: 1 Visit OT Evaluation $OT Eval Moderate Complexity: 1 Mod OT Treatments $Self Care/Home Management : 23-37 mins  Dessie Coma, M.S. OTR/L  10/13/21, 12:32 PM  ascom [phone]

## 2021-10-13 NOTE — Progress Notes (Addendum)
Pt is requesting for crackers for snack but is on clear liquid. NP Randol Kern made aware. Will continue to monitor.  Update 0204: NP Randol Kern advance diet from clear liquid to heart/carb modified diet. Will continue to monitor.

## 2021-10-13 NOTE — Progress Notes (Deleted)
ANTICOAGULATION CONSULT NOTE   Pharmacy Consult for heparin infusion Indication: ACS/STEMI  Allergies  Allergen Reactions   Pantoprazole Nausea And Vomiting   Metformin And Related Diarrhea    Patient Measurements: Height: 5\' 6"  (167.6 cm) Weight: 77.1 kg (170 lb) IBW/kg (Calculated) : 59.3 Heparin Dosing Weight: 75 kg  Vital Signs: Temp: 98.4 F (36.9 C) (10/21 0359) BP: 114/70 (10/21 0359) Pulse Rate: 99 (10/21 0359)  Labs: Recent Labs    10/10/21 1058 10/10/21 1446 10/10/21 2125 10/11/21 0710 10/11/21 1214 10/11/21 1456 10/12/21 0400 10/13/21 0518  HGB  --   --    < > 17.0*  --   --  15.5* 13.4  HCT  --   --   --  50.0*  --   --  44.2 39.9  PLT  --   --   --  285  --   --  256 236  APTT 50*  --    < > 76*  --  67* 76* 27  HEPARINUNFRC  --   --   --  >1.10*  --   --  0.91* 0.20*  CREATININE  --   --   --   --  0.78  --  0.54 0.58  TROPONINIHS 2,798* 1,621*  --   --   --   --   --   --    < > = values in this interval not displayed.     Estimated Creatinine Clearance: 52.9 mL/min (by C-G formula based on SCr of 0.58 mg/dL).   Medical History: Past Medical History:  Diagnosis Date   Arrhythmia    Atrial flutter ([place]) 01/2018   new onset    Basal cell carcinoma of back    Basal cell carcinoma of lip    Cerebral aneurysm    followed by [facility]   CHF (congestive heart failure) (HCC)    DDD (degenerative disc disease), lumbar    superior plate depression, L3 08/18/2014   Diabetes mellitus type II, controlled ([place])    Diverticulosis    Dysrhythmia    Paroxysmal Supraventricular Tachycardia   Dysthymia    depression   GERD (gastroesophageal reflux disease)    History of meniscal tear    Hyperlipidemia    Hypertension    Hypothyroidism    Late onset Alzheimer's disease with behavioral disturbance (HCC)    Mild pulmonary hypertension (HCC)    Overactive bladder    Peripheral vascular disease (HCC)    Seasonal allergic rhinitis    Sleep apnea      Medications:  PTA Meds:  Eliquis 5 mg BID  Assessment: 87 yo female with h/o Afib on Eliquis, DM, HTN, hypothyroidism and dementia who was admitted for CP found with elevated troponin I, trending up 716 >> 7649. Pharmacy was consulted for heparin dosing. Pt on DOAC PTA.   Baseline HL noted to be >1.10 secondary to Eliquis treatment   Date/time HL/aPTT Interpretation/change 10/18 1058 aPTT=50 Subthera at 950 u/hr 10/18 2125 aPTT=65 Subthera at 1200 u/hr 10/19 0710 aPTT=76 Thera at 1350 u/hr(HL>1.10, not correlating) 10/19 1456 aPTT=67 Thera at 1350 10/20 0400 aPTT=76, HL 0.91, Thera x 2 10/21 0518 aPTT 27, HL 0.2, subtherapeutic   Goal of Therapy:  Heparin level 0.3-0.7 units/ml once aPTT and heparin level correlate.  aPTT 66-102 seconds Monitor platelets by anticoagulation protocol: Yes   Plan:  Bolus 1100 units x 1 Will increase heparin infusion to 1600 units/hr Will recheck HL in 8 hrs after rate change Daily  CBC while on heparin ggt   Renda Rolls, PharmD, [name] 10/13/2021 6:25 AM

## 2021-10-13 NOTE — Evaluation (Signed)
Physical Therapy Evaluation Patient Details Name: Alison Richardson MRN: [id number] DOB: 19-Nov-1933 Today's Date: 10/13/2021  History of Present Illness  87 y.o. female with a past medical history of atrial fibrillation on Eliquis and amiodarone, type 2 diabetes, hypertension, hyperlipidemia, hypothyroid who presented to the ED with complaints of chest discomfort occurring 10/17 at approximately 4 PM. Peak troponin 7649 consistent Non-ST elevation myocardial infarction. S/p Cardiac catheterization 10/12/21.  Clinical Impression  Patient received in recliner (slid down), son present. She is agreeable to PT assessment.  Patient requires min guard for positioning in recliner and sit to stand. Min guard for ambulation using RW 150 feet. Requires cues for safe use of AD and pacing. Patient is weak in LEs and at increased fall risk. Fatigued after ambulation.  She will continue to benefit from skilled PT while here to improve functional independence and strength.      Recommendations for follow up therapy are one component of a multi-disciplinary discharge planning process, led by the attending physician.  Recommendations may be updated based on patient status, additional functional criteria and insurance authorization.  Follow Up Recommendations SNF    Equipment Recommendations  None recommended by PT    Recommendations for Other Services       Precautions / Restrictions Precautions Precautions: Fall Restrictions Weight Bearing Restrictions: No Other Position/Activity Restrictions: RUE treated like NWB per protocol s/p cardiac cath initial 48 hours (started 10/12/21 1445)      Mobility  Bed Mobility Overal bed mobility: Modified Independent Bed Mobility: Sit to Supine     Supine to sit: Min assist Sit to supine: Modified independent (Device/Increase time)   General bed mobility comments: Patient able to get back into bed with mod independence and cues    Transfers Overall transfer  level: Needs assistance Equipment used: Rolling walker (2 wheeled) Transfers: Sit to/from Stand Sit to Stand: Min guard         General transfer comment: cues for hand placement.  Ambulation/Gait Ambulation/Gait assistance: Min guard Gait Distance (Feet): 150 Feet Assistive device: Rolling walker (2 wheeled) Gait Pattern/deviations: Step-through pattern Gait velocity: decr   General Gait Details: patient ambulated with min guard, cues for safety and use of AD. Weak with ambulation.  Stairs            Wheelchair Mobility    Modified Rankin (Stroke Patients Only)       Balance Overall balance assessment: Needs assistance Sitting-balance support: Feet supported Sitting balance-Leahy Scale: Good     Standing balance support: Bilateral upper extremity supported;During functional activity Standing balance-Leahy Scale: Good Standing balance comment: benefitting from B UE support at this time due to weak legs                             Pertinent Vitals/Pain Pain Assessment: No/denies pain    Home Living Family/patient expects to be discharged to:: Skilled nursing facility Living Arrangements: Alone Available Help at Discharge: Neighbor;Available PRN/intermittently Type of Home: Apartment Home Access: Elevator     Home Layout: One level Home Equipment: Walker - 4 wheels;Shower seat;Grab bars - tub/shower;Grab bars - toilet Additional Comments: [facility]    Prior Function Level of Independence: Independent with assistive device(s)         Comments: MOD I for I/ADLs and mobility using SPC/4WW as needed. Pt drives     Hand Dominance   Dominant Hand: Right    Extremity/Trunk Assessment   Upper Extremity  Assessment Upper Extremity Assessment: Generalized weakness    Lower Extremity Assessment Lower Extremity Assessment: Generalized weakness    Cervical / Trunk Assessment Cervical / Trunk Assessment: Normal   Communication   Communication: HOH  Cognition Arousal/Alertness: Awake/alert Behavior During Therapy: WFL for tasks assessed/performed Overall Cognitive Status: Within Functional Limits for tasks assessed                                        General Comments      Exercises     Assessment/Plan    PT Assessment Patient needs continued PT services  PT Problem List Decreased mobility;Decreased activity tolerance;Decreased balance;Decreased strength       PT Treatment Interventions DME instruction;Therapeutic activities;Gait training;Therapeutic exercise;Functional mobility training;Patient/family education    PT Goals (Current goals can be found in the Care Plan section)  Acute Rehab PT Goals Patient Stated Goal: patient feeling weak. Wants STR at discharge prior to returning home alone. PT Goal Formulation: With patient/family Time For Goal Achievement: 10/20/21 Potential to Achieve Goals: Good    Frequency Min 2X/week   Barriers to discharge Decreased caregiver support      Co-evaluation               AM-PAC PT "6 Clicks" Mobility  Outcome Measure Help needed turning from your back to your side while in a flat bed without using bedrails?: A Little Help needed moving from lying on your back to sitting on the side of a flat bed without using bedrails?: A Little Help needed moving to and from a bed to a chair (including a wheelchair)?: A Little Help needed standing up from a chair using your arms (e.g., wheelchair or bedside chair)?: A Little Help needed to walk in hospital room?: A Little Help needed climbing 3-5 steps with a railing? : A Lot 6 Click Score: 17    End of Session Equipment Utilized During Treatment: Gait belt Activity Tolerance: Patient limited by fatigue Patient left: in bed;with call bell/phone within reach;with bed alarm set;with family/visitor present Nurse Communication: Mobility status PT Visit Diagnosis: Muscle weakness  (generalized) (M62.81);Unsteadiness on feet (R26.81)    Time: 7510-2585 PT Time Calculation (min) (ACUTE ONLY): 16 min   Charges:   PT Evaluation $PT Eval Moderate Complexity: 1 Mod          Edem Tiegs, PT, GCS 10/13/21,11:45 AM

## 2021-10-14 DIAGNOSIS — J340 Abscess, furuncle and carbuncle of nose: Secondary | ICD-10-CM | POA: Diagnosis not present

## 2021-10-14 DIAGNOSIS — I214 Non-ST elevation (NSTEMI) myocardial infarction: Secondary | ICD-10-CM | POA: Diagnosis not present

## 2021-10-14 DIAGNOSIS — I480 Paroxysmal atrial fibrillation: Secondary | ICD-10-CM | POA: Diagnosis not present

## 2021-10-14 LAB — CBC
HCT: 40.9 % (ref 36.0–46.0)
Hemoglobin: 14.5 g/dL (ref 12.0–15.0)
MCH: 32.7 pg (ref 26.0–34.0)
MCHC: 35.5 g/dL (ref 30.0–36.0)
MCV: 92.3 fL (ref 80.0–100.0)
Platelets: 239 10*3/uL (ref 150–400)
RBC: 4.43 MIL/uL (ref 3.87–5.11)
RDW: 14.6 % (ref 11.5–15.5)
WBC: 14.8 10*3/uL — ABNORMAL HIGH (ref 4.0–10.5)
nRBC: 0 % (ref 0.0–0.2)

## 2021-10-14 LAB — GLUCOSE, CAPILLARY
Glucose-Capillary: 264 mg/dL — ABNORMAL HIGH (ref 70–99)
Glucose-Capillary: 330 mg/dL — ABNORMAL HIGH (ref 70–99)
Glucose-Capillary: 305 mg/dL — ABNORMAL HIGH (ref 70–99)
Glucose-Capillary: 262 mg/dL — ABNORMAL HIGH (ref 70–99)

## 2021-10-14 LAB — BASIC METABOLIC PANEL
Anion gap: 8 (ref 5–15)
BUN: 11 mg/dL (ref 8–23)
CO2: 25 mmol/L (ref 22–32)
Calcium: 8.4 mg/dL — ABNORMAL LOW (ref 8.9–10.3)
Chloride: 105 mmol/L (ref 98–111)
Creatinine, Ser: 0.59 mg/dL (ref 0.44–1.00)
GFR, Estimated: >60 mL/min (ref >60)
Glucose, Bld: 219 mg/dL — ABNORMAL HIGH (ref 70–99)
Potassium: 3.7 mmol/L (ref 3.5–5.1)
Sodium: 138 mmol/L (ref 135–145)

## 2021-10-14 LAB — MRSA NEXT GEN BY PCR, NASAL: MRSA by PCR Next Gen: DETECTED — AB

## 2021-10-14 LAB — MAGNESIUM: Magnesium: 1.9 mg/dL (ref 1.7–2.4)

## 2021-10-14 MED ORDER — INSULIN ASPART 100 UNIT/ML IJ SOLN
0.0000 [IU] | Freq: Three times a day (TID) | INTRAMUSCULAR | Status: DC
  Administered 2021-10-14 (×2): 11 [IU] via SUBCUTANEOUS
  Filled 2021-10-14 (×2): qty 1

## 2021-10-14 MED ORDER — GENTAMICIN SULFATE 0.1 % EX OINT
TOPICAL_OINTMENT | Freq: Four times a day (QID) | CUTANEOUS | Status: DC
  Administered 2021-10-14 – 2021-10-16 (×7): 1 via TOPICAL
  Filled 2021-10-14: qty 15

## 2021-10-14 MED ORDER — DEXAMETHASONE SODIUM PHOSPHATE 10 MG/ML IJ SOLN
10.0000 mg | Freq: Three times a day (TID) | INTRAMUSCULAR | Status: DC
  Administered 2021-10-14 – 2021-10-15 (×4): 10 mg via INTRAVENOUS
  Filled 2021-10-14 (×4): qty 1

## 2021-10-14 MED ORDER — INSULIN ASPART 100 UNIT/ML IJ SOLN: 0.0000 [IU] | Freq: Three times a day (TID) | INTRAMUSCULAR | Status: DC

## 2021-10-14 MED ORDER — TRAZODONE HCL 50 MG PO TABS
25.0000 mg | ORAL_TABLET | Freq: Every evening | ORAL | Status: DC | PRN
  Administered 2021-10-15 – 2021-10-16 (×3): 25 mg via ORAL
  Filled 2021-10-14 (×3): qty 1

## 2021-10-14 NOTE — Consult Note (Signed)
Alison, Richardson [id number] Mar 04, 1933 Alison Dusky, MD  Reason for Consult: Nasal abscess  HPI: 87 year old female admitted for acute MI has been complaining for 8 to 10 days of nasal soreness mainly on the left-hand side.  She continues to have painful swelling at the tip of the nose.  A CT scan was obtained showed a small abscess on the left anterior tip of the nose.  She has been receiving antibiotics since her admission and is feeling some better.  Allergies:  Allergies  Allergen Reactions   Pantoprazole Nausea And Vomiting   Metformin And Related Diarrhea    ROS: Review of systems normal other than 12 systems except per HPI.  PMH:  Past Medical History:  Diagnosis Date   Arrhythmia    Atrial flutter ([place]) 01/2018   new onset    Basal cell carcinoma of back    Basal cell carcinoma of lip    Cerebral aneurysm    followed by [facility]   CHF (congestive heart failure) (HCC)    DDD (degenerative disc disease), lumbar    superior plate depression, L3 08/18/2014   Diabetes mellitus type II, controlled ([place])    Diverticulosis    Dysrhythmia    Paroxysmal Supraventricular Tachycardia   Dysthymia    depression   GERD (gastroesophageal reflux disease)    History of meniscal tear    Hyperlipidemia    Hypertension    Hypothyroidism    Late onset Alzheimer's disease with behavioral disturbance (HCC)    Mild pulmonary hypertension (HCC)    Overactive bladder    Peripheral vascular disease (HCC)    Seasonal allergic rhinitis    Sleep apnea     FH:  Family History  Problem Relation Age of Onset   Hypertension Mother    CAD Father    Diabetes Sister    Diabetes Paternal Uncle     SH:  Social History   Socioeconomic History   Marital status: Married    Spouse name: Not on file   Number of children: 5   Years of education: Not on file   Highest education level: Not on file  Occupational History   Not on file  Tobacco Use   Smoking status: Never   Smokeless  tobacco: Never  Vaping Use   Vaping Use: Never used  Substance and Sexual Activity   Alcohol use: Yes    Alcohol/week: 3.0 standard drinks    Types: 3 Shots of liquor per week    Comment: 3 x week    Drug use: Never   Sexual activity: Not on file  Other Topics Concern   Not on file  Social History Narrative   Not on file   Social Determinants of Health   Financial Resource Strain: Not on file  Food Insecurity: Not on file  Transportation Needs: Not on file  Physical Activity: Not on file  Stress: Not on file  Social Connections: Not on file  Intimate Partner Violence: Not on file    PSH:  Past Surgical History:  Procedure Laterality Date   APPENDECTOMY  1946   BASAL CELL CARCINOMA EXCISION     2006 and 2009 removed from back and lip   CARDIOVASCULAR STRESS TEST  2015   nuclear cardiac stress test negative for ischemia - Dr. Satira Sark   CATARACT EXTRACTION, BILATERAL  2006   COLONOSCOPY  2014   COLONOSCOPY N/A 08/19/2020   Procedure: COLONOSCOPY;  Surgeon: Lesly Rubenstein, MD;  Location: [facility] ENDOSCOPY;  Service: Endoscopy;  Laterality: N/A;   ECTOPIC PREGNANCY SURGERY  1957   ESOPHAGOGASTRODUODENOSCOPY N/A 08/19/2020   Procedure: ESOPHAGOGASTRODUODENOSCOPY (EGD);  Surgeon: Lesly Rubenstein, MD;  Location: [facility] ENDOSCOPY;  Service: Endoscopy;  Laterality: N/A;   INJECTION KNEE Left 05/2015   LEFT HEART CATH AND CORONARY ANGIOGRAPHY N/A 10/12/2021   Procedure: LEFT HEART CATH AND CORONARY ANGIOGRAPHY;  Surgeon: Corey Skains, MD;  Location: [facility];  Service: Cardiovascular;  Laterality: N/A;   REPLACEMENT TOTAL KNEE Left 09/06/2016   Dr. Barnet Pall   TONSILLECTOMY AND ADENOIDECTOMY  1953   TOTAL ABDOMINAL HYSTERECTOMY  1986   [facility]    Physical  Exam: The external ears appear clear the oral cavity oropharynx and neck appear benign examination of the nose shows obvious inflammation and swelling of the tip of the nose with some fluctuance to the left of  midline.  Review of the CT scan did show a collection measuring just over a centimeter on the tip of the nose just to the left of midline.   A/P: Nasal cellulitis with abscess, aspiration and drainage recommended.  Discussion with the patient where this is on the tip of the nose and the fact that she is on significant anticoagulation I recommended aspiration of this area for culture and sensitivity.  Patient agreed, the nose was prepped sterilely with an alcohol swab.  A local anesthetic of 1% lidocaine with 1000's epinephrine was used to inject over the left nasal tip.  A total of half cc was used.  At approximately 5-minute an 18-gauge needle was used to probe and aspirate just to the left of midline.  Approximately 1.1 cc of serosanguineous pus and mucoid material was aspirated from this area.  This was sent for culture and sensitivity.  Recommend continue IV antibiotics with coverage for MRSA until the culture results return at which point she can be discharged home on appropriate p.o. medication.  I have given her my card with phone number for her to call upon discharge for follow-up appointment in my office.  If her symptoms worsen it may require a more open procedure which would require general anesthetic which would be dangerous currently with her cardiac issues.  Also with her anticoagulation open procedure at this point would be difficult to control bleeding.  We will continue IV therapy with guided treatment.  I will also add gentamicin ointment intranasally and Decadron to decrease swelling upon discharge would send home with a 6-day double strength Sterapred prednisone taper for swelling as well.   [name] 10/14/2021 3:46 AM

## 2021-10-14 NOTE — Consult Note (Signed)
Neurosurgery-New Consultation Evaluation 10/14/2021 Alison Richardson [id number]  Identifying Statement: Alison Richardson is a 87 y.o. female from [place] [id number] with intracranial aneurysm  Physician Requesting Consultation: Eppie Gibson, MD  History of Present Illness: Alison Richardson is admitted for management of an acute MI.  She has been also recently treated for concern of infection on her nose where a CT had been obtained due to concern for abscess.  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  The patient has known about this aneurysm and it was previously followed at [facility] where she had multiple imaging performed over a years revealing stability.  In discussion of treatment options at that time, surveillance was recommended.  Currently, she does not endorse any severe headaches.  She denies any strokelike symptoms.  She specifically denies any weakness or numbness.  We are asked to evaluate given the CT finding.  Past Medical History:  Past Medical History:  Diagnosis Date   Arrhythmia    Atrial flutter ([place]) 01/2018   new onset    Basal cell carcinoma of back    Basal cell carcinoma of lip    Cerebral aneurysm    followed by [facility]   CHF (congestive heart failure) (HCC)    DDD (degenerative disc disease), lumbar    superior plate depression, L3 08/18/2014   Diabetes mellitus type II, controlled ([place])    Diverticulosis    Dysrhythmia    Paroxysmal Supraventricular Tachycardia   Dysthymia    depression   GERD (gastroesophageal reflux disease)    History of meniscal tear    Hyperlipidemia    Hypertension    Hypothyroidism    Late onset Alzheimer's disease with behavioral disturbance (HCC)    Mild pulmonary hypertension (HCC)    Overactive bladder    Peripheral vascular disease (HCC)    Seasonal allergic rhinitis    Sleep apnea     Social History: Social History   Socioeconomic History   Marital status:  Married    Spouse name: Not on file   Number of children: 5   Years of education: Not on file   Highest education level: Not on file  Occupational History   Not on file  Tobacco Use   Smoking status: Never   Smokeless tobacco: Never  Vaping Use   Vaping Use: Never used  Substance and Sexual Activity   Alcohol use: Yes    Alcohol/week: 3.0 standard drinks    Types: 3 Shots of liquor per week    Comment: 3 x week    Drug use: Never   Sexual activity: Not on file  Other Topics Concern   Not on file  Social History Narrative   Not on file   Social Determinants of Health   Financial Resource Strain: Not on file  Food Insecurity: Not on file  Transportation Needs: Not on file  Physical Activity: Not on file  Stress: Not on file  Social Connections: Not on file  Intimate Partner Violence: Not on file   Family History: Family History  Problem Relation Age of Onset   Hypertension Mother    CAD Father    Diabetes Sister    Diabetes Paternal Uncle     Review of Systems:  Review of Systems - General ROS: Negative Psychological ROS: Negative Ophthalmic ROS: Negative ENT ROS: Negative Hematological and Lymphatic ROS: Negative  Endocrine ROS: Negative Respiratory ROS: Negative Cardiovascular ROS: Negative Gastrointestinal ROS: Negative Genito-Urinary  ROS: Negative Musculoskeletal ROS: Negative Neurological ROS: Negative for headache, weakness, numbness Dermatological ROS: Negative  Physical Exam: BP (!) 141/89 (BP Location: Right Arm)   Pulse (!) 110   Temp 97.9 F (36.6 C)   Resp 16   Ht 5\' 6"  (1.676 m)   Wt 77.1 kg   SpO2 96%   BMI 27.44 kg/m  Body mass index is 27.44 kg/m. Body surface area is 1.89 meters squared. General appearance: Alert, cooperative, in no acute distress Head: Normocephalic, atraumatic, discoloration around nasal tip Eyes: Normal, EOM intact Oropharynx: Moist without lesions Ext: No edema in LE bilaterally  Neurologic exam:  Mental  status: alertness: alert, orientation: person, place, time, affect: normal Speech: fluent and clear, names and repeats Cranial nerves:  II: Visual fields are full by confrontation, no ptosis III/IV/VI: extra-ocular motions intact bilaterally V/VII:no evidence of facial droop  VIII: hearing normal XI: trapezius strength symmetric,  sternocleidomastoid strength symmetric XII: tongue strength symmetric  Motor:strength symmetric 5/5, normal muscle mass and tone in all extremities Sensory: intact to light touch in all extremities Gait: Not tested   Imaging: CT head: 12 mm right MCA aneurysm noted with some partial calcification   Impression/Plan:  Alison Richardson is here for evaluation of a MRI and nasal infection.  She has a known 12 mm aneurysm that has been stable on imaging since at least 2019 in our system.  The size of this aneurysm does not lead to a high risk of rupture but she does understand that this is a risk.  However, she has had previous discussion with neuro vascular specialist which indicated that the treatment would also carry high risk.  I am in agreement with this and would not recommend any intervention at this time.  I did offer referral to a neuro endovascular specialist for follow-up and she declines.   1.  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  Plan -No intervention recommended -Patient can have follow-up with neuro endovascular in the future if she desires  Deetta Perla, MD

## 2021-10-14 NOTE — Progress Notes (Signed)
PROGRESS NOTE    Alison Richardson  CLE:[id number] DOB: [birth year] DOA: 10/10/2021 PCP: Rusty Aus, MD    Assessment & Plan:   Principal Problem:   NSTEMI (non-ST elevated myocardial infarction) [facility]) Active Problems:   Paroxysmal atrial fibrillation with rapid ventricular response (HCC)   Type 2 diabetes mellitus with diabetic neuropathy (HCC)   Cognitive impairment, mild, so stated   Benign essential hypertension   Hypothyroidism   Chronic diastolic CHF (congestive heart failure) ([place])   NSTEMI: w/ chest pain, elevated troponins. S/p cardiac cath which showed diffuse one vessel coronary atherosclerosis as per cardio. Continue on plavix as per cardio    PAF: w/ RVR. Continue on eliquis, amiodarone & metoprolol   Nasal abscess: continue on IV clindamycin, steroids & gentamicin ointment as per ENT. S/p aspiration of abscess by ENT. Wound cx is pending. MRSA screen positive. 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 Previously known to pt. Discussed w/ Dr. Lacinda Axon, neuro surg, and pt can f/u outpatient. Not a good surgical candidate and not a high risk of rupture as per neuro surg. Neuro surg recs apprec   Generalized weakness: PT/OT recs SNF   DM2: poorly controlled, HbA1c 8.5. Increased SSI secondary to starting steroids    HTN: continue on BB    Chronic diastolic CHF: continue on metoprolol, lasix. Monitor I/Os   Cognitive impairment: continue on home dose of donepezil    Hypothyroidism: continue on home dose of levothyroxine   Leukocytosis: likely secondary to abscess & steroid use   Hypokalemia: WNL today    DVT prophylaxis: eliquis  Code Status: full  Family Communication:  Disposition Plan:  PT/OT recs SNF   Level of care: Progressive Cardiac  Status is: Inpatient  Remains inpatient appropriate because: severity of illness, waiting on cx results and possibly needs SNF placement     Consultants:  Cardio    Procedures:   Antimicrobials:    Subjective: Pt still c/o nose pain   Objective: Vitals:   10/13/21 1938 10/14/21 0024 10/14/21 0411 10/14/21 0518  BP: 105/68 129/77 (!) 147/98 120/87  Pulse: 95 95 (!) 119 (!) 107  Resp: 18 18 18    Temp: 98.6 F (37 C) 98.6 F (37 C) 98.2 F (36.8 C)   TempSrc:      SpO2: 94% 93% 94%   Weight:      Height:        Intake/Output Summary (Last 24 hours) at 10/14/2021 0755 Last data filed at 10/14/2021 0601 Gross per 24 hour  Intake 940 ml  Output 1250 ml  Net -310 ml   Filed Weights   10/10/21 0220  Weight: 77.1 kg    Examination:  General exam: Appears uncomfortable. Erythematous, edematous , tender palpation nose Respiratory system: clear breath sounds b/l. No rubs or clicks  Cardiovascular system: S1/S2+. No rubs or clicks  Gastrointestinal system: Abd is soft, NT, ND & hypoactive bowel sounds  Central nervous system: alert and oriented. Moves all extremities  Psychiatry: judgement and insight appear normal. Flat mood and affect     Data Reviewed: I have personally reviewed following labs and imaging studies  CBC: Recent Labs  Lab 10/09/21 1734 10/11/21 0710 10/12/21 0400 10/13/21 0518 10/14/21 0414  WBC 10.1 14.6* 17.8* 13.5* 14.8*  NEUTROABS 7.0  --   --   --   --   HGB 15.9* 17.0* 15.5* 13.4 14.5  HCT 45.4 50.0* 44.2 39.9 40.9  MCV 91.9 90.3  90.2 90.7 92.3  PLT 258 285 256 236 702   Basic Metabolic Panel: Recent Labs  Lab 10/09/21 1734 10/09/21 1903 10/11/21 1214 10/12/21 0400 10/13/21 0518 10/14/21 0414  NA 139  --  137 134* 137 138  K 4.0  --  3.4* 3.5 3.6 3.7  CL 105  --  102 102 105 105  CO2 20*  --  26 23 23 25   GLUCOSE 161*  --  242* 236* 235* 219*  BUN 16  --  10 8 11 11   CREATININE QUANTITY NOT SUFFICIENT, UNABLE TO PERFORM TEST 0.84 0.78 0.54 0.58 0.59  CALCIUM 9.0  --  8.7* 8.7* 8.4* 8.4*  MG  --   --   --  2.1 1.9 1.9   GFR: Estimated Creatinine Clearance: 52.9 mL/min (by C-G  formula based on SCr of 0.59 mg/dL). Liver Function Tests: Recent Labs  Lab 10/09/21 1734  AST 26  ALT 25  ALKPHOS 88  BILITOT 0.8  PROT 7.6  ALBUMIN 4.2   Recent Labs  Lab 10/09/21 1734  LIPASE 24   No results for input(s): AMMONIA in the last 168 hours. Coagulation Profile: Recent Labs  Lab 10/10/21 0302  INR 1.2   Cardiac Enzymes: No results for input(s): CKTOTAL, CKMB, CKMBINDEX, TROPONINI in the last 168 hours. BNP (last 3 results) No results for input(s): PROBNP in the last 8760 hours. HbA1C: No results for input(s): HGBA1C in the last 72 hours. CBG: Recent Labs  Lab 10/12/21 2032 10/13/21 0813 10/13/21 1152 10/13/21 1707 10/13/21 2024  GLUCAP 195* 183* 183* 188* 214*   Lipid Profile: No results for input(s): CHOL, HDL, LDLCALC, TRIG, CHOLHDL, LDLDIRECT in the last 72 hours. Thyroid Function Tests: No results for input(s): TSH, T4TOTAL, FREET4, T3FREE, THYROIDAB in the last 72 hours. Anemia Panel: No results for input(s): VITAMINB12, FOLATE, FERRITIN, TIBC, IRON, RETICCTPCT in the last 72 hours. Sepsis Labs: No results for input(s): PROCALCITON, LATICACIDVEN in the last 168 hours.  Recent Results (from the past 240 hour(s))  Resp Panel by RT-PCR (Flu A&B, Covid) Nasopharyngeal Swab     Status: None   Collection Time: 10/10/21  3:02 AM   Specimen: Nasopharyngeal Swab; Nasopharyngeal(NP) swabs in vial transport medium  Result Value Ref Range Status   SARS Coronavirus 2 by RT PCR NEGATIVE NEGATIVE Final    Comment: (NOTE) SARS-CoV-2 target nucleic acids are NOT DETECTED.  The SARS-CoV-2 RNA is generally detectable in upper respiratory specimens during the acute phase of infection. The lowest concentration of SARS-CoV-2 viral copies this assay can detect is 138 copies/mL. A negative result does not preclude SARS-Cov-2 infection and should not be used as the sole basis for treatment or other patient management decisions. A negative result may occur with   improper specimen collection/handling, submission of specimen other than nasopharyngeal swab, presence of viral mutation(s) within the areas targeted by this assay, and inadequate number of viral copies(<138 copies/mL). A negative result must be combined with clinical observations, patient history, and epidemiological information. The expected result is Negative.  Fact Sheet for Patients:  EntrepreneurPulse.com.au  Fact Sheet for Healthcare Providers:  IncredibleEmployment.be  This test is no t yet approved or cleared by the [place] FDA and  has been authorized for detection and/or diagnosis of SARS-CoV-2 by FDA under an Emergency Use Authorization (EUA). This EUA will remain  in effect (meaning this test can be used) for the duration of the COVID-19 declaration under Section 564(b)(1) of the Act, 21 U.S.C.section 360bbb-3(b)(1), unless the  authorization is terminated  or revoked sooner.       Influenza A by PCR NEGATIVE NEGATIVE Final   Influenza B by PCR NEGATIVE NEGATIVE Final    Comment: (NOTE) The Xpert Xpress SARS-CoV-2/FLU/RSV plus assay is intended as an aid in the diagnosis of influenza from Nasopharyngeal swab specimens and should not be used as a sole basis for treatment. Nasal washings and aspirates are unacceptable for Xpert Xpress SARS-CoV-2/FLU/RSV testing.  Fact Sheet for Patients: EntrepreneurPulse.com.au  Fact Sheet for Healthcare Providers: IncredibleEmployment.be  This test is not yet approved or cleared by the [place] FDA and has been authorized for detection and/or diagnosis of SARS-CoV-2 by FDA under an Emergency Use Authorization (EUA). This EUA will remain in effect (meaning this test can be used) for the duration of the COVID-19 declaration under Section 564(b)(1) of the Act, 21 U.S.C. section 360bbb-3(b)(1), unless the authorization is terminated  or revoked.  Performed at [facility], [address] [place], [place] [address]   MRSA Next Gen by PCR, Nasal     Status: Abnormal   Collection Time: 10/14/21  4:10 AM   Specimen: Nasal Aspirate; Nasal Swab  Result Value Ref Range Status   MRSA by PCR Next Gen DETECTED (A) NOT DETECTED Final    Comment: RESULT CALLED TO, READ BACK BY AND VERIFIED WITH: TEMEKA COLES AT 8588 10/14/21.PMF (NOTE) The GeneXpert MRSA Assay (FDA approved for NASAL specimens only), is one component of a comprehensive MRSA colonization surveillance program. It is not intended to diagnose MRSA infection nor to guide or monitor treatment for MRSA infections. Test performance is not FDA approved in patients less than 67 years old. Performed at [facility], [address] [place], [place] [address]          Radiology Studies: CARDIAC CATHETERIZATION  Result Date: 10/12/2021   Ost RCA to Prox RCA lesion is 99% stenosed.   Dist RCA lesion is 65% stenosed.   RPDA lesion is 60% stenosed.   1st Diag lesion is 99% stenosed.   Ost Cx to Prox Cx lesion is 25% stenosed.   Ost LAD to Prox LAD lesion is 25% stenosed.   There is mild left ventricular systolic dysfunction.   LV end diastolic pressure is mildly elevated.   The left ventricular ejection fraction is 35-45% by visual estimate. 87 year old female with known chronic nonvalvular atrial fibrillation hypertension hyperlipidemia with acute non-ST elevation myocardial infarction Cardiac catheterization showing mild inferior hypokinesis and apical hypokinesis with ejection fraction of 40 to 45% Severe and/or critical stenosis of ostial right coronary artery and moderate atherosclerosis of distal right coronary artery and PDA complex in nature with good collaterals from left anterior descending artery Mild atherosclerosis of left anterior descending artery and circumflex artery After discussion with cardiovascular team would best be medically  manage at this time due to complex lesions and not ideal for PCI and stent placement Plan Isosorbide for better collateral flow 2.  Increase beta-blocker for better heart rate control of atrial fibrillation with a goal heart rate between 60 and 90 bpm 3.  Reinstate anticoagulation for further risk reduction and stroke with atrial fibrillation 4.  Single antiplatelet therapy with Plavix 5.  Hypertension control and high intensity cholesterol therapy 6.  Cardiac rehabilitation   CT MAXILLOFACIAL W CONTRAST  Result Date: 10/13/2021 CLINICAL DATA:  Maxillary and facial abscess of the nose. EXAM: CT MAXILLOFACIAL WITH CONTRAST TECHNIQUE: Multidetector CT imaging of the maxillofacial structures was performed with intravenous contrast. Multiplanar  CT image reconstructions were also generated. CONTRAST:  78mL OMNIPAQUE IOHEXOL 300 MG/ML  SOLN COMPARISON:  Head CT 09/29/2019 FINDINGS: Osseous: No regional osseous abnormality. Orbits: Orbits are normal. Sinuses: Sinuses are clear. Soft tissues: Swelling of the tip of the nose with a E peripherally enhancing abscess at the tip extending somewhat towards the left measuring approximately 1.5 cm in diameter. Other soft tissues of the facial region appear normal. Limited intracranial: Redemonstration of a 12 mm right MCA aneurysm. This is a high risk aneurysm. Has this been evaluated by a neurovascular specialist? IMPRESSION: Inflammation of the nose with a 1.5 cm abscess at the tip centrally extending slightly towards the left. 12 mm right MCA aneurysm. This is at high risk of rupture. Has this been evaluated by a neurovascular specialist? Electronically Signed   By: Nelson Chimes M.D.   On: 10/13/2021 16:05        Scheduled Meds:  amiodarone  200 mg Oral Daily   apixaban  5 mg Oral BID   atorvastatin  80 mg Oral Daily   clopidogrel  75 mg Oral Daily   dexamethasone (DECADRON) injection  10 mg Intravenous Q8H   docusate sodium  200 mg Oral BID   donepezil  10 mg  Oral QHS   furosemide  20 mg Oral Daily   gentamicin ointment   Topical QID   insulin aspart  0-9 Units Subcutaneous TID WC   isosorbide mononitrate  30 mg Oral Daily   levothyroxine  88 mcg Oral Daily   lisinopril  5 mg Oral Daily   metoprolol tartrate  100 mg Oral BID   sodium chloride flush  3 mL Intravenous Q12H   Continuous Infusions:  clindamycin (CLEOCIN) IV 600 mg (10/14/21 0601)      LOS: 4 days    Time spent: 33 mins     Wyvonnia Dusky, MD [facility] Pager [phone]  If 7PM-7AM, please contact night-coverage 10/14/2021, 7:55 AM

## 2021-10-15 DIAGNOSIS — I214 Non-ST elevation (NSTEMI) myocardial infarction: Secondary | ICD-10-CM | POA: Diagnosis not present

## 2021-10-15 DIAGNOSIS — I480 Paroxysmal atrial fibrillation: Secondary | ICD-10-CM | POA: Diagnosis not present

## 2021-10-15 DIAGNOSIS — J340 Abscess, furuncle and carbuncle of nose: Secondary | ICD-10-CM | POA: Diagnosis not present

## 2021-10-15 LAB — CBC
HCT: 38.7 % (ref 36.0–46.0)
Hemoglobin: 13.2 g/dL (ref 12.0–15.0)
MCH: 30.6 pg (ref 26.0–34.0)
MCHC: 34.1 g/dL (ref 30.0–36.0)
MCV: 89.8 fL (ref 80.0–100.0)
Platelets: 266 10*3/uL (ref 150–400)
RBC: 4.31 MIL/uL (ref 3.87–5.11)
RDW: 13.7 % (ref 11.5–15.5)
WBC: 12.5 10*3/uL — ABNORMAL HIGH (ref 4.0–10.5)
nRBC: 0 % (ref 0.0–0.2)

## 2021-10-15 LAB — BASIC METABOLIC PANEL
Anion gap: 6 (ref 5–15)
BUN: 22 mg/dL (ref 8–23)
CO2: 24 mmol/L (ref 22–32)
Calcium: 8.6 mg/dL — ABNORMAL LOW (ref 8.9–10.3)
Chloride: 106 mmol/L (ref 98–111)
Creatinine, Ser: 0.65 mg/dL (ref 0.44–1.00)
GFR, Estimated: >60 mL/min (ref >60)
Glucose, Bld: 371 mg/dL — ABNORMAL HIGH (ref 70–99)
Potassium: 3.7 mmol/L (ref 3.5–5.1)
Sodium: 136 mmol/L (ref 135–145)

## 2021-10-15 LAB — GLUCOSE, CAPILLARY
Glucose-Capillary: 426 mg/dL — ABNORMAL HIGH (ref 70–99)
Glucose-Capillary: 339 mg/dL — ABNORMAL HIGH (ref 70–99)
Glucose-Capillary: 232 mg/dL — ABNORMAL HIGH (ref 70–99)
Glucose-Capillary: 312 mg/dL — ABNORMAL HIGH (ref 70–99)

## 2021-10-15 LAB — MAGNESIUM: Magnesium: 1.9 mg/dL (ref 1.7–2.4)

## 2021-10-15 MED ORDER — INSULIN ASPART 100 UNIT/ML IJ SOLN
0.0000 [IU] | Freq: Three times a day (TID) | INTRAMUSCULAR | Status: DC
  Administered 2021-10-15: 7 [IU] via SUBCUTANEOUS
  Administered 2021-10-15: 15 [IU] via SUBCUTANEOUS
  Administered 2021-10-16: 11 [IU] via SUBCUTANEOUS
  Filled 2021-10-15 (×3): qty 1

## 2021-10-15 MED ORDER — VENLAFAXINE HCL ER 75 MG PO CP24
75.0000 mg | ORAL_CAPSULE | Freq: Every day | ORAL | Status: DC
  Administered 2021-10-15 – 2021-10-17 (×3): 75 mg via ORAL
  Filled 2021-10-15 (×3): qty 1

## 2021-10-15 MED ORDER — INSULIN ASPART 100 UNIT/ML IJ SOLN
INTRAMUSCULAR | Status: AC
  Administered 2021-10-15: 20 [IU]
  Filled 2021-10-15: qty 1

## 2021-10-15 MED ORDER — DEXAMETHASONE SODIUM PHOSPHATE 10 MG/ML IJ SOLN
3.0000 mg | Freq: Three times a day (TID) | INTRAMUSCULAR | Status: DC
  Administered 2021-10-15 – 2021-10-17 (×7): 3 mg via INTRAVENOUS
  Filled 2021-10-15 (×7): qty 1

## 2021-10-15 MED ORDER — VENLAFAXINE HCL ER 75 MG PO CP24: 75.0000 mg | ORAL_CAPSULE | Freq: Every day | ORAL | Status: DC

## 2021-10-15 NOTE — Progress Notes (Signed)
10/15/2021 9:30 AM  Alison Richardson, Alison Richardson [id number]  Nose is feeling much better after aspiration and drainage, final culture result pending.   Temp:  [97.9 F (36.6 C)-98.7 F (37.1 C)] 98.7 F (37.1 C) (10/23 0808) Pulse Rate:  [98-109] 109 (10/23 0808) Resp:  [16-20] 18 (10/23 0808) BP: (97-152)/(68-103) 128/90 (10/23 0808) SpO2:  [93 %-100 %] 97 % (10/23 0808),     Intake/Output Summary (Last 24 hours) at 10/15/2021 0930 Last data filed at 10/15/2021 0457 Gross per 24 hour  Intake 699.28 ml  Output 1100 ml  Net -400.72 ml    Results for orders placed or performed during the hospital encounter of 10/10/21 (from the past 24 hour(s))  Glucose, capillary     Status: Abnormal   Collection Time: 10/14/21 11:17 AM  Result Value Ref Range   Glucose-Capillary 330 (H) 70 - 99 mg/dL  Glucose, capillary     Status: Abnormal   Collection Time: 10/14/21  4:36 PM  Result Value Ref Range   Glucose-Capillary 305 (H) 70 - 99 mg/dL  Glucose, capillary     Status: Abnormal   Collection Time: 10/14/21  8:24 PM  Result Value Ref Range   Glucose-Capillary 262 (H) 70 - 99 mg/dL  CBC     Status: Abnormal   Collection Time: 10/15/21  4:30 AM  Result Value Ref Range   WBC 12.5 (H) 4.0 - 10.5 K/uL   RBC 4.31 3.87 - 5.11 MIL/uL   Hemoglobin 13.2 12.0 - 15.0 g/dL   HCT 38.7 36.0 - 46.0 %   MCV 89.8 80.0 - 100.0 fL   MCH 30.6 26.0 - 34.0 pg   MCHC 34.1 30.0 - 36.0 g/dL   RDW 13.7 11.5 - 15.5 %   Platelets 266 150 - 400 K/uL   nRBC 0.0 0.0 - 0.2 %  Basic metabolic panel     Status: Abnormal   Collection Time: 10/15/21  4:30 AM  Result Value Ref Range   Sodium 136 135 - 145 mmol/L   Potassium 3.7 3.5 - 5.1 mmol/L   Chloride 106 98 - 111 mmol/L   CO2 24 22 - 32 mmol/L   Glucose, Bld 371 (H) 70 - 99 mg/dL   BUN 22 8 - 23 mg/dL   Creatinine, Ser 0.65 0.44 - 1.00 mg/dL   Calcium 8.6 (L) 8.9 - 10.3 mg/dL   GFR, Estimated >60 >60 mL/min   Anion gap 6 5 - 15  Magnesium     Status: None    Collection Time: 10/15/21  4:30 AM  Result Value Ref Range   Magnesium 1.9 1.7 - 2.4 mg/dL  Glucose, capillary     Status: Abnormal   Collection Time: 10/15/21  8:05 AM  Result Value Ref Range   Glucose-Capillary 426 (H) 70 - 99 mg/dL    SUBJECTIVE: Feeling better tenderness and swelling have improved  OBJECTIVE: Significant improvement in nasal swelling and erythema.  Some mild drainage on the left intranasally  IMPRESSION: Nasal cellulitis that is post I&D of small abscess culture so far shows gram-positive cocci, initial nasal swab positive for MRSA.  Significant improvement on clindamycin and IV Decadron final culture result and sensitivity pending.  Most likely staph  PLAN: Significant improvement on IV medication, final sensitivities pending.  Would follow culture and sensitivity got oral medication.  Would discharged home on appropriate antibiotic for a 2-week course, and a tapering dose of prednisone.  Understand type 2 diabetes I would advise to monitor diet and blood sugars closely  while on prednisone as sugars have been elevated slightly on Decadron.  She has my business card we will schedule a follow-up with me later this week.  I will sign off for now, if symptoms worsen feel free to reconsult.  Alison Richardson 10/15/2021, 9:30 AM

## 2021-10-15 NOTE — Progress Notes (Signed)
PROGRESS NOTE    Alison Richardson  YIF:[id number] DOB: [birth year] DOA: 10/10/2021 PCP: Rusty Aus, MD    Assessment & Plan:   Principal Problem:   NSTEMI (non-ST elevated myocardial infarction) [facility]) Active Problems:   Paroxysmal atrial fibrillation with rapid ventricular response (HCC)   Type 2 diabetes mellitus with diabetic neuropathy (HCC)   Cognitive impairment, mild, so stated   Benign essential hypertension   Hypothyroidism   Chronic diastolic CHF (congestive heart failure) ([place])   NSTEMI: w/ chest pain, elevated troponins. S/p cardiac cath which showed diffuse one vessel coronary atherosclerosis as per cardio. Continue on plavix as per cardio    PAF: w/ RVR. Continue on metoprolol, amio & eliquis   Nasal abscess: continue on IV clindamycin & gentamicin ointment as per ENT.  Decreased IV steroids to 3mg  TID as likely causing pt to be delirious. S/p aspiration of abscess by ENT. Wound cx growing mod staph aureus, sens pending.  MRSA screen positive. 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 Previously known to pt. Discussed w/ Dr. Lacinda Axon, neuro surg, and pt can f/u outpatient. Not a good surgical candidate and not a high risk of rupture as per neuro surg. Neuro surg recs apprec   Generalized weakness: PT/OT recs SNF. Pt wants to go home & possibly w/ HH   DM2: HbA1c 8.5, poorly controlled. Continue on SSI w/ accuchecks. BS are elevated secondary to steroid use    HTN: continue on metoprolol    Chronic diastolic CHF: continue on lasix, metoprolol. Monitor I/Os    Cognitive impairment: continue on home dose of donepezil    Hypothyroidism: continue on home dose of levothyroxine   Leukocytosis:likely secondary to abscess and steroid use.   Hypokalemia: WNL today    DVT prophylaxis: eliquis  Code Status: full  Family Communication: discussed pt's care w/ pt's son, Jenny Reichmann, and answered his questions  Disposition Plan:  PT/OT recs SNF   Level  of care: Progressive Cardiac  Status is: Inpatient  Remains inpatient appropriate because: severity of illness, waiting on cx results and possibly needs SNF placement     Consultants:  Cardio   Procedures:   Antimicrobials:    Subjective: Pt c/o not sleeping well & overnight pt was angry at "everyone."  Objective: Vitals:   10/14/21 2029 10/15/21 0001 10/15/21 0455 10/15/21 0808  BP: 112/77 (!) 152/103 105/76 128/90  Pulse: (!) 104 (!) 102 100 (!) 109  Resp: 20 18 18 18   Temp: 98.6 F (37 C) 98 F (36.7 C) 97.9 F (36.6 C) 98.7 F (37.1 C)  TempSrc:      SpO2: 93% 100% 97% 97%  Weight:      Height:        Intake/Output Summary (Last 24 hours) at 10/15/2021 0820 Last data filed at 10/15/2021 0457 Gross per 24 hour  Intake 702.28 ml  Output 1100 ml  Net -397.72 ml   Filed Weights   10/10/21 0220  Weight: 77.1 kg    Examination:  General exam: Appears restless  Respiratory system: clear breath sounds b/l.  Cardiovascular system: S1/S2+. No rubs or clicks  Gastrointestinal system: Abd is soft, NT, ND & hypoactive bowel sounds  Central nervous system: Alert and awake. Moves all extremities  Psychiatry: judgement and insight appears abnormal. Tangential speech     Data Reviewed: I have personally reviewed following labs and imaging studies  CBC: Recent Labs  Lab 10/09/21 1734 10/11/21 0710 10/12/21 0400 10/13/21  0518 10/14/21 0414 10/15/21 0430  WBC 10.1 14.6* 17.8* 13.5* 14.8* 12.5*  NEUTROABS 7.0  --   --   --   --   --   HGB 15.9* 17.0* 15.5* 13.4 14.5 13.2  HCT 45.4 50.0* 44.2 39.9 40.9 38.7  MCV 91.9 90.3 90.2 90.7 92.3 89.8  PLT 258 285 256 236 239 371   Basic Metabolic Panel: Recent Labs  Lab 10/11/21 1214 10/12/21 0400 10/13/21 0518 10/14/21 0414 10/15/21 0430  NA 137 134* 137 138 136  K 3.4* 3.5 3.6 3.7 3.7  CL 102 102 105 105 106  CO2 26 23 23 25 24   GLUCOSE 242* 236* 235* 219* 371*  BUN 10 8 11 11 22   CREATININE 0.78  0.54 0.58 0.59 0.65  CALCIUM 8.7* 8.7* 8.4* 8.4* 8.6*  MG  --  2.1 1.9 1.9 1.9   GFR: Estimated Creatinine Clearance: 52.9 mL/min (by C-G formula based on SCr of 0.65 mg/dL). Liver Function Tests: Recent Labs  Lab 10/09/21 1734  AST 26  ALT 25  ALKPHOS 88  BILITOT 0.8  PROT 7.6  ALBUMIN 4.2   Recent Labs  Lab 10/09/21 1734  LIPASE 24   No results for input(s): AMMONIA in the last 168 hours. Coagulation Profile: Recent Labs  Lab 10/10/21 0302  INR 1.2   Cardiac Enzymes: No results for input(s): CKTOTAL, CKMB, CKMBINDEX, TROPONINI in the last 168 hours. BNP (last 3 results) No results for input(s): PROBNP in the last 8760 hours. HbA1C: No results for input(s): HGBA1C in the last 72 hours. CBG: Recent Labs  Lab 10/14/21 0808 10/14/21 1117 10/14/21 1636 10/14/21 2024 10/15/21 0805  GLUCAP 264* 330* 305* 262* 426*   Lipid Profile: No results for input(s): CHOL, HDL, LDLCALC, TRIG, CHOLHDL, LDLDIRECT in the last 72 hours. Thyroid Function Tests: No results for input(s): TSH, T4TOTAL, FREET4, T3FREE, THYROIDAB in the last 72 hours. Anemia Panel: No results for input(s): VITAMINB12, FOLATE, FERRITIN, TIBC, IRON, RETICCTPCT in the last 72 hours. Sepsis Labs: No results for input(s): PROCALCITON, LATICACIDVEN in the last 168 hours.  Recent Results (from the past 240 hour(s))  Resp Panel by RT-PCR (Flu A&B, Covid) Nasopharyngeal Swab     Status: None   Collection Time: 10/10/21  3:02 AM   Specimen: Nasopharyngeal Swab; Nasopharyngeal(NP) swabs in vial transport medium  Result Value Ref Range Status   SARS Coronavirus 2 by RT PCR NEGATIVE NEGATIVE Final    Comment: (NOTE) SARS-CoV-2 target nucleic acids are NOT DETECTED.  The SARS-CoV-2 RNA is generally detectable in upper respiratory specimens during the acute phase of infection. The lowest concentration of SARS-CoV-2 viral copies this assay can detect is 138 copies/mL. A negative result does not preclude  SARS-Cov-2 infection and should not be used as the sole basis for treatment or other patient management decisions. A negative result may occur with  improper specimen collection/handling, submission of specimen other than nasopharyngeal swab, presence of viral mutation(s) within the areas targeted by this assay, and inadequate number of viral copies(<138 copies/mL). A negative result must be combined with clinical observations, patient history, and epidemiological information. The expected result is Negative.  Fact Sheet for Patients:  EntrepreneurPulse.com.au  Fact Sheet for Healthcare Providers:  IncredibleEmployment.be  This test is no t yet approved or cleared by the [place] FDA and  has been authorized for detection and/or diagnosis of SARS-CoV-2 by FDA under an Emergency Use Authorization (EUA). This EUA will remain  in effect (meaning this test can be used)  for the duration of the COVID-19 declaration under Section 564(b)(1) of the Act, 21 U.S.C.section 360bbb-3(b)(1), unless the authorization is terminated  or revoked sooner.       Influenza A by PCR NEGATIVE NEGATIVE Final   Influenza B by PCR NEGATIVE NEGATIVE Final    Comment: (NOTE) The Xpert Xpress SARS-CoV-2/FLU/RSV plus assay is intended as an aid in the diagnosis of influenza from Nasopharyngeal swab specimens and should not be used as a sole basis for treatment. Nasal washings and aspirates are unacceptable for Xpert Xpress SARS-CoV-2/FLU/RSV testing.  Fact Sheet for Patients: EntrepreneurPulse.com.au  Fact Sheet for Healthcare Providers: IncredibleEmployment.be  This test is not yet approved or cleared by the [place] FDA and has been authorized for detection and/or diagnosis of SARS-CoV-2 by FDA under an Emergency Use Authorization (EUA). This EUA will remain in effect (meaning this test can be used) for the duration of  the COVID-19 declaration under Section 564(b)(1) of the Act, 21 U.S.C. section 360bbb-3(b)(1), unless the authorization is terminated or revoked.  Performed at [facility], [address] [place], [place] [address]   MRSA Next Gen by PCR, Nasal     Status: Abnormal   Collection Time: 10/14/21  4:10 AM   Specimen: Nasal Aspirate; Nasal Swab  Result Value Ref Range Status   MRSA by PCR Next Gen DETECTED (A) NOT DETECTED Final    Comment: RESULT CALLED TO, READ BACK BY AND VERIFIED WITH: TEMEKA COLES AT 2426 10/14/21.PMF (NOTE) The GeneXpert MRSA Assay (FDA approved for NASAL specimens only), is one component of a comprehensive MRSA colonization surveillance program. It is not intended to diagnose MRSA infection nor to guide or monitor treatment for MRSA infections. Test performance is not FDA approved in patients less than 7 years old. Performed at [facility], [address] [place], [place] [address]   Aerobic/Anaerobic Culture w Gram Stain (surgical/deep wound)     Status: None (Preliminary result)   Collection Time: 10/14/21  4:10 AM   Specimen: Nose; Abscess  Result Value Ref Range Status   Specimen Description   Final    NOSE Performed at [facility], [address] [place], [place] [address]    Special Requests   Final    NONE Performed at [facility], [place], [place] [address]    Gram Stain   Final    NO WBC SEEN RARE GRAM POSITIVE COCCI Performed at [facility], [place] [address] [place],  [address]    Culture PENDING  Incomplete   Report Status PENDING  Incomplete         Radiology Studies: CT MAXILLOFACIAL W CONTRAST  Result Date: 10/13/2021 CLINICAL DATA:  Maxillary and facial abscess of the nose. EXAM: CT MAXILLOFACIAL WITH CONTRAST TECHNIQUE: Multidetector CT imaging of the maxillofacial structures was performed with intravenous contrast. Multiplanar CT image reconstructions  were also generated. CONTRAST:  73mL OMNIPAQUE IOHEXOL 300 MG/ML  SOLN COMPARISON:  Head CT 09/29/2019 FINDINGS: Osseous: No regional osseous abnormality. Orbits: Orbits are normal. Sinuses: Sinuses are clear. Soft tissues: Swelling of the tip of the nose with a E peripherally enhancing abscess at the tip extending somewhat towards the left measuring approximately 1.5 cm in diameter. Other soft tissues of the facial region appear normal. Limited intracranial: Redemonstration of a 12 mm right MCA aneurysm. This is a high risk aneurysm. Has this been evaluated by a neurovascular specialist? IMPRESSION: Inflammation of the nose with a 1.5 cm abscess at  the tip centrally extending slightly towards the left. 12 mm right MCA aneurysm. This is at high risk of rupture. Has this been evaluated by a neurovascular specialist? Electronically Signed   By: Nelson Chimes M.D.   On: 10/13/2021 16:05        Scheduled Meds:  amiodarone  200 mg Oral Daily   apixaban  5 mg Oral BID   atorvastatin  80 mg Oral Daily   clopidogrel  75 mg Oral Daily   dexamethasone (DECADRON) injection  10 mg Intravenous Q8H   docusate sodium  200 mg Oral BID   donepezil  10 mg Oral QHS   furosemide  20 mg Oral Daily   gentamicin ointment   Topical QID   insulin aspart  0-20 Units Subcutaneous TID WC   isosorbide mononitrate  30 mg Oral Daily   levothyroxine  88 mcg Oral Daily   lisinopril  5 mg Oral Daily   metoprolol tartrate  100 mg Oral BID   sodium chloride flush  3 mL Intravenous Q12H   Continuous Infusions:  clindamycin (CLEOCIN) IV 600 mg (10/15/21 0502)      LOS: 5 days    Time spent: 25 mins     Wyvonnia Dusky, MD [facility] Pager [phone]  If 7PM-7AM, please contact night-coverage 10/15/2021, 8:20 AM

## 2021-10-16 DIAGNOSIS — I480 Paroxysmal atrial fibrillation: Secondary | ICD-10-CM | POA: Diagnosis not present

## 2021-10-16 DIAGNOSIS — I214 Non-ST elevation (NSTEMI) myocardial infarction: Secondary | ICD-10-CM | POA: Diagnosis not present

## 2021-10-16 DIAGNOSIS — J340 Abscess, furuncle and carbuncle of nose: Secondary | ICD-10-CM | POA: Diagnosis not present

## 2021-10-16 LAB — CBC
HCT: 36.7 % (ref 36.0–46.0)
Hemoglobin: 12.7 g/dL (ref 12.0–15.0)
MCH: 31 pg (ref 26.0–34.0)
MCHC: 34.6 g/dL (ref 30.0–36.0)
MCV: 89.5 fL (ref 80.0–100.0)
Platelets: 297 10*3/uL (ref 150–400)
RBC: 4.1 MIL/uL (ref 3.87–5.11)
RDW: 14 % (ref 11.5–15.5)
WBC: 16.8 10*3/uL — ABNORMAL HIGH (ref 4.0–10.5)
nRBC: 0 % (ref 0.0–0.2)

## 2021-10-16 LAB — BASIC METABOLIC PANEL
Anion gap: 13 (ref 5–15)
BUN: 28 mg/dL — ABNORMAL HIGH (ref 8–23)
CO2: 21 mmol/L — ABNORMAL LOW (ref 22–32)
Calcium: 8.7 mg/dL — ABNORMAL LOW (ref 8.9–10.3)
Chloride: 102 mmol/L (ref 98–111)
Creatinine, Ser: 0.87 mg/dL (ref 0.44–1.00)
GFR, Estimated: >60 mL/min (ref >60)
Glucose, Bld: 265 mg/dL — ABNORMAL HIGH (ref 70–99)
Potassium: 3.9 mmol/L (ref 3.5–5.1)
Sodium: 136 mmol/L (ref 135–145)

## 2021-10-16 LAB — GLUCOSE, CAPILLARY
Glucose-Capillary: 273 mg/dL — ABNORMAL HIGH (ref 70–99)
Glucose-Capillary: 258 mg/dL — ABNORMAL HIGH (ref 70–99)
Glucose-Capillary: 233 mg/dL — ABNORMAL HIGH (ref 70–99)
Glucose-Capillary: 257 mg/dL — ABNORMAL HIGH (ref 70–99)

## 2021-10-16 LAB — MAGNESIUM: Magnesium: 1.7 mg/dL (ref 1.7–2.4)

## 2021-10-16 LAB — HEMOGLOBIN A1C
Hgb A1c MFr Bld: 7.7 % — ABNORMAL HIGH (ref 4.8–5.6)
Mean Plasma Glucose: 174 mg/dL

## 2021-10-16 MED ORDER — INSULIN ASPART 100 UNIT/ML IJ SOLN
3.0000 [IU] | Freq: Three times a day (TID) | INTRAMUSCULAR | Status: DC
  Administered 2021-10-16 – 2021-10-17 (×3): 3 [IU] via SUBCUTANEOUS
  Filled 2021-10-16 (×3): qty 1

## 2021-10-16 MED ORDER — INSULIN GLARGINE-YFGN 100 UNIT/ML ~~LOC~~ SOLN
8.0000 [IU] | Freq: Every day | SUBCUTANEOUS | Status: DC
  Administered 2021-10-16 – 2021-10-17 (×2): 8 [IU] via SUBCUTANEOUS
  Filled 2021-10-16 (×3): qty 0.08

## 2021-10-16 MED ORDER — SODIUM CHLORIDE 0.9 % IV SOLN: INTRAVENOUS | Status: DC | PRN

## 2021-10-16 MED ORDER — INSULIN ASPART 100 UNIT/ML IJ SOLN
0.0000 [IU] | Freq: Three times a day (TID) | INTRAMUSCULAR | Status: DC
  Administered 2021-10-16: 7 [IU] via SUBCUTANEOUS
  Administered 2021-10-16: 11 [IU] via SUBCUTANEOUS
  Administered 2021-10-17 (×2): 7 [IU] via SUBCUTANEOUS
  Filled 2021-10-16 (×4): qty 1

## 2021-10-16 MED ORDER — INSULIN ASPART 100 UNIT/ML IJ SOLN
0.0000 [IU] | Freq: Every day | INTRAMUSCULAR | Status: DC
  Administered 2021-10-16: 3 [IU] via SUBCUTANEOUS
  Filled 2021-10-16: qty 1

## 2021-10-16 NOTE — Progress Notes (Signed)
Inpatient Diabetes Program Recommendations  AACE/ADA: New Consensus Statement on Inpatient Glycemic Control   Target Ranges:  Prepandial:   less than 140 mg/dL      Peak postprandial:   less than 180 mg/dL (1-2 hours)      Critically ill patients:  140 - 180 mg/dL   Results for Alison Richardson, Alison Richardson (MRN [id number]) as of 10/16/2021 09:31  Ref. Range 10/15/2021 08:05 10/15/2021 11:59 10/15/2021 16:16 10/15/2021 20:20 10/16/2021 08:27  Glucose-Capillary Latest Ref Range: 70 - 99 mg/dL 426 (H) 339 (H) 232 (H) 312 (H) 273 (H)   Review of Glycemic Control  Diabetes history: DM2 Outpatient Diabetes medications: Glipizide XL 2.5 mg daily Current orders for Inpatient glycemic control: Novolog 0-20 units TID with meals; Decadron 3 mg Q8H  Inpatient Diabetes Program Recommendations:    Insulin: If steroids are continued as ordered, please consider ordering Semglee 8 units Q24H, adding Novolog 0-5 units QHS for bedtime correction, and Novolog 3 units TID with meals for meal coverage if patient eats at least 50% of meals.  Thanks, Barnie Alderman, RN, MSN, CDE Diabetes Coordinator Inpatient Diabetes Program [phone] (Team Pager from 8am to 5pm)

## 2021-10-16 NOTE — Care Management Important Message (Signed)
Important Message  Patient Details  Name: Alison Richardson MRN: [id number] Date of Birth: 1933-02-13   Medicare Important Message Given:  Yes     Dannette Barbara 10/16/2021, 4:09 PM

## 2021-10-16 NOTE — TOC Initial Note (Signed)
Transition of Care [facility]) - Initial/Assessment Note    Patient Details  Name: Alison Richardson MRN: [id number] Date of Birth: 05-08-1933  Transition of Care [facility]) CM/SW Contact:    Alberteen Sam, LCSW Phone Number: 10/16/2021, 9:27 AM  Clinical Narrative:                  CSW spoke with patient's daughter [name] regarding PT/OT recs of SNF. She is in agreement and reports that patient is currently at [facility] at [place] in an apartment and can go to their rehab.   CSW has reached out to [facility] to confirm patient can go to their rehab at discharge, referral sent in hub.    Expected Discharge Plan: Skilled Nursing Facility Barriers to Discharge: Continued Medical Work up   Patient Goals and CMS Choice Patient states their goals for this hospitalization and ongoing recovery are:: to go home CMS Medicare.gov Compare Post Acute Care list provided to:: Patient Represenative (must comment) (daughter [name]) Choice offered to / list presented to : Adult Children  Expected Discharge Plan and Services Expected Discharge Plan: [place] Acute Care Choice: [place] Living arrangements for the past 2 months: [place]                                      Prior Living Arrangements/Services Living arrangements for the past 2 months: [place] Lives with:: Self                   Activities of Daily Living      Permission Sought/Granted                  Emotional Assessment         Alcohol / Substance Use: Not Applicable Psych Involvement: No (comment)  Admission diagnosis:  NSTEMI (non-ST elevated myocardial infarction) ([place]) [I21.4] Atrial fibrillation with RVR ([place]) [I48.91] Patient Active Problem List   Diagnosis Date Noted   NSTEMI (non-ST elevated myocardial infarction) ([place]) 10/10/2021   Chronic diastolic CHF (congestive heart failure) ([place]) 10/13/2020   Acute CHF (congestive heart  failure) ([place]) 10/07/2020   Elevated troponin 10/07/2020   CHF (congestive heart failure) ([place]) 10/07/2020   Leukocytosis 10/07/2020   DOE (dyspnea on exertion) 05/09/2020   Syncope and collapse 09/30/2019   Syncope 09/29/2019   Cor pulmonale, chronic ([place]) 09/27/2019   Late onset Alzheimer's disease without behavioral disturbance ([place]) 09/23/2019   Obstructive sleep apnea 07/09/2019   Diabetes mellitus type 2 in nonobese ([place]) 01/21/2019   Dyslipidemia 01/21/2019   PSVT (paroxysmal supraventricular tachycardia) ([place]) 01/21/2019   Tenosynovitis of foot 01/21/2019   PAD (peripheral artery disease) ([place]) 01/06/2019   Preop testing 01/06/2019   DM type 2 with diabetic mixed hyperlipidemia ([place]) 09/08/2018   Peripheral neuropathy, idiopathic 09/08/2018   Anxiety disorder 06/19/2018   Depression, major, single episode, mild ([place]) 06/19/2018   Paroxysmal atrial fibrillation with rapid ventricular response ([place]) 06/19/2018   Atrial flutter ([place]) 06/19/2018   Pure hypercholesterolemia 06/19/2018   Vitamin B12 deficiency 06/19/2018   Insomnia 06/19/2018   Protein-calorie malnutrition ([place]) 06/19/2018   Type 2 diabetes mellitus with diabetic neuropathy ([place]) 06/19/2018   Knee pain 06/19/2018   Cognitive impairment, mild, so stated 06/19/2018   Osteoarthritis of right knee 06/19/2018   Benign essential hypertension 06/19/2018   Hypothyroidism 06/19/2018   Overactive bladder 06/19/2018  Generalized muscle weakness 06/19/2018   Difficulty in walking, not elsewhere classified 06/19/2018   Major depression in remission ([place]) 06/18/2018   Primary osteoarthritis involving multiple joints 06/18/2018   Mixed hyperlipidemia 11/01/2015   Cerebral aneurysm 04/29/2013   PCP:  Rusty Aus, MD Pharmacy:   [place], [place] - [address] [place] [address] Phone: [phone] Fax: [phone]     Social Determinants of Health (SDOH)  Interventions    Readmission Risk Interventions No flowsheet data found.

## 2021-10-16 NOTE — Progress Notes (Signed)
Physical Therapy Treatment Patient Details Name: Alison Richardson MRN: [id number] DOB: 1933-08-07 Today's Date: 10/16/2021   History of Present Illness 87 y.o. female with a past medical history of atrial fibrillation on Eliquis and amiodarone, type 2 diabetes, hypertension, hyperlipidemia, hypothyroid who presented to the ED with complaints of chest discomfort occurring 10/17 at approximately 4 PM. Peak troponin 7649 consistent Non-ST elevation myocardial infarction. S/p Cardiac catheterization 10/12/21.    PT Comments    Pt seated in recliner beginning of session noting fatigue after recently completing OT session. However, pt states a desire to continue working on sit <> stand transfers due to feelings of instability and weakness upon admission.  Pt performed 5 STS w/ RW, MIN-G for cues. No LOB noted but does demonstrate weakness and decreased endurance. Pt endorsed dizziness and slight chest tightness after STS and further mobility deferred. HR monitored throughout mobility Hrmax: 121 with rest breaks in between STS attempts. RN notified of pt symptoms and pt left in chair with RN in room. SNF remains current discharge recommendation. Skilled PT intervention is indicated to address deficits in function, mobility, and to return to PLOF as able.     Recommendations for follow up therapy are one component of a multi-disciplinary discharge planning process, led by the attending physician.  Recommendations may be updated based on patient status, additional functional criteria and insurance authorization.  Follow Up Recommendations  Skilled nursing-short term rehab (<3 hours/day)     Assistance Recommended at Discharge Intermittent Supervision/Assistance  Equipment Recommendations  None recommended by PT    Recommendations for Other Services       Precautions / Restrictions Precautions Precautions: Fall Restrictions Weight Bearing Restrictions: No     Mobility  Bed Mobility                General bed mobility comments: Pt seated in recliner beginning and end of session    Transfers Overall transfer level: Needs assistance Equipment used: Rolling walker (2 wheels) Transfers: Sit to/from Stand Sit to Stand: Min guard           General transfer comment: no physical assist provided, cues for hand placement with one hand on RW with pt stating overall improvment and less effort required for technqiue.    Ambulation/Gait             General Gait Details: Deferred   Stairs             Wheelchair Mobility    Modified Rankin (Stroke Patients Only)       Balance Overall balance assessment: Needs assistance Sitting-balance support: No upper extremity supported;Feet supported Sitting balance-Leahy Scale: Good Sitting balance - Comments: able to reach across midline without LOB   Standing balance support: During functional activity;Single extremity supported Standing balance-Leahy Scale: Fair Standing balance comment: BUE support for safety due to pt stating feelings of weakness, instability                            Cognition Arousal/Alertness: Awake/alert Behavior During Therapy: WFL for tasks assessed/performed Overall Cognitive Status: Within Functional Limits for tasks assessed                                          Exercises Other Exercises Other Exercises: STS x 5 w/ RW, CGA cues for forward trunk lean and maintaining weight over  BOS    General Comments        Pertinent Vitals/Pain Pain Assessment: Faces Faces Pain Scale: No hurt Pain Location: Back, wrist Pain Intervention(s): Monitored during session    Home Living                          Prior Function            PT Goals (current goals can now be found in the care plan section) Progress towards PT goals: Progressing toward goals    Frequency    Min 2X/week      PT Plan Current plan remains appropriate     Co-evaluation              AM-PAC PT "6 Clicks" Mobility   Outcome Measure  Help needed turning from your back to your side while in a flat bed without using bedrails?: A Little Help needed moving from lying on your back to sitting on the side of a flat bed without using bedrails?: A Little Help needed moving to and from a bed to a chair (including a wheelchair)?: A Little Help needed standing up from a chair using your arms (e.g., wheelchair or bedside chair)?: None Help needed to walk in hospital room?: A Little Help needed climbing 3-5 steps with a railing? : A Little 6 Click Score: 19    End of Session Equipment Utilized During Treatment: Gait belt Activity Tolerance: Other (comment) (limited d/t to dizziness, chest pressure) Patient left: in chair;with call bell/phone within reach;with nursing/sitter in room;with chair alarm set Nurse Communication: Mobility status;Other (comment) (dizziness, chest tightness) PT Visit Diagnosis: Muscle weakness (generalized) (M62.81);Unsteadiness on feet (R26.81)     Time: 6979-4801 PT Time Calculation (min) (ACUTE ONLY): 25 min  Charges:                        [employer], SPT

## 2021-10-16 NOTE — Progress Notes (Signed)
PROGRESS NOTE    Alison Richardson  OIZ:[id number] DOB: 02-May-1933 DOA: 10/10/2021 PCP: Rusty Aus, MD    Assessment & Plan:   Principal Problem:   NSTEMI (non-ST elevated myocardial infarction) [facility]) Active Problems:   Paroxysmal atrial fibrillation with rapid ventricular response (HCC)   Type 2 diabetes mellitus with diabetic neuropathy (HCC)   Cognitive impairment, mild, so stated   Benign essential hypertension   Hypothyroidism   Chronic diastolic CHF (congestive heart failure) ([place])   NSTEMI: w/ chest pain, elevated troponins. S/p cardiac cath which showed diffuse one vessel coronary atherosclerosis as per cardio. Continue on plavix as per cardio    PAF: w/ RVR. Continue on metoprolol, amio & eliquis   Nasal abscess: continue on IV clindamycin, steroids & gentamicin ointment as per ENT. Wound cx growing staph aureus, sens pending. S/p aspiration  of abscess by ENT. MRSA screen was positive. 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 Previously known to pt. Discussed w/ Dr. Lacinda Axon, neuro surg, and pt can f/u outpatient. Not a good surgical candidate and not a high risk of rupture as per neuro surg. Neuro surg recs apprec   Generalized weakness: PT/OT recs SNF. Pt is going back to [facility] but the SNF side and pt is agreeable   DM2: HbA1c 8.5, poorly controlled. Continue on SSI w/ accuchecks. BS are elevated secondary to steroid use    HTN: continue on BB    Chronic diastolic CHF: continue on metoprolol, lasix. Monitor I/Os  continue on lasix, metoprolol. Monitor I/Os    Cognitive impairment: continue on home dose of donepezil    Hypothyroidism: continue on levothyroxine    Leukocytosis: likely secondary to steroid use & infection   Hypokalemia: WNL today    DVT prophylaxis: eliquis  Code Status: full  Family Communication: discussed pt's care w/ pt's son, Jenny Reichmann, and answered his questions  Disposition Plan:  PT/OT recs SNF   Level of  care: Progressive Cardiac  Status is: Inpatient  Remains inpatient appropriate because: severity of illness, waiting on cx results and possibly needs SNF placement     Consultants:  Cardio   Procedures:   Antimicrobials:    Subjective: Pt c/o having very vivid dreams   Objective: Vitals:   10/15/21 1506 10/15/21 1930 10/16/21 0024 10/16/21 0449  BP: 101/76 114/72 133/76 116/82  Pulse: 88 94 98 81  Resp: 18 18 18 18   Temp: 97.9 F (36.6 C) 98 F (36.7 C) 98.3 F (36.8 C) 97.8 F (36.6 C)  TempSrc:      SpO2: 96% 94% 96% 94%  Weight:      Height:        Intake/Output Summary (Last 24 hours) at 10/16/2021 0752 Last data filed at 10/16/2021 0452 Gross per 24 hour  Intake 240 ml  Output 1000 ml  Net -760 ml   Filed Weights   10/10/21 0220  Weight: 77.1 kg    Examination:  General exam: Appears comfortable   Respiratory system: clear breath sounds b/l   Cardiovascular system: S1 & S2+. No rubs or gallops  Gastrointestinal system: Abd is soft, NT, ND & hypoactive bowel sounds  Central nervous system: alert and awake. Moves all extremities  Psychiatry: judgement and insight appears abnormal. Tangential speech     Data Reviewed: I have personally reviewed following labs and imaging studies  CBC: Recent Labs  Lab 10/09/21 1734 10/11/21 0710 10/12/21 0400 10/13/21 0518 10/14/21 0414 10/15/21 0430 10/16/21 3382  WBC 10.1   < > 17.8* 13.5* 14.8* 12.5* 16.8*  NEUTROABS 7.0  --   --   --   --   --   --   HGB 15.9*   < > 15.5* 13.4 14.5 13.2 12.7  HCT 45.4   < > 44.2 39.9 40.9 38.7 36.7  MCV 91.9   < > 90.2 90.7 92.3 89.8 89.5  PLT 258   < > 256 236 239 266 297   < > = values in this interval not displayed.   Basic Metabolic Panel: Recent Labs  Lab 10/12/21 0400 10/13/21 0518 10/14/21 0414 10/15/21 0430 10/16/21 0456  NA 134* 137 138 136 136  K 3.5 3.6 3.7 3.7 3.9  CL 102 105 105 106 102  CO2 23 23 25 24  21*  GLUCOSE 236* 235* 219* 371* 265*   BUN 8 11 11 22  28*  CREATININE 0.54 0.58 0.59 0.65 0.87  CALCIUM 8.7* 8.4* 8.4* 8.6* 8.7*  MG 2.1 1.9 1.9 1.9 1.7   GFR: Estimated Creatinine Clearance: 48.7 mL/min (by C-G formula based on SCr of 0.87 mg/dL). Liver Function Tests: Recent Labs  Lab 10/09/21 1734  AST 26  ALT 25  ALKPHOS 88  BILITOT 0.8  PROT 7.6  ALBUMIN 4.2   Recent Labs  Lab 10/09/21 1734  LIPASE 24   No results for input(s): AMMONIA in the last 168 hours. Coagulation Profile: Recent Labs  Lab 10/10/21 0302  INR 1.2   Cardiac Enzymes: No results for input(s): CKTOTAL, CKMB, CKMBINDEX, TROPONINI in the last 168 hours. BNP (last 3 results) No results for input(s): PROBNP in the last 8760 hours. HbA1C: No results for input(s): HGBA1C in the last 72 hours. CBG: Recent Labs  Lab 10/14/21 2024 10/15/21 0805 10/15/21 1159 10/15/21 1616 10/15/21 2020  GLUCAP 262* 426* 339* 232* 312*   Lipid Profile: No results for input(s): CHOL, HDL, LDLCALC, TRIG, CHOLHDL, LDLDIRECT in the last 72 hours. Thyroid Function Tests: No results for input(s): TSH, T4TOTAL, FREET4, T3FREE, THYROIDAB in the last 72 hours. Anemia Panel: No results for input(s): VITAMINB12, FOLATE, FERRITIN, TIBC, IRON, RETICCTPCT in the last 72 hours. Sepsis Labs: No results for input(s): PROCALCITON, LATICACIDVEN in the last 168 hours.  Recent Results (from the past 240 hour(s))  Resp Panel by RT-PCR (Flu A&B, Covid) Nasopharyngeal Swab     Status: None   Collection Time: 10/10/21  3:02 AM   Specimen: Nasopharyngeal Swab; Nasopharyngeal(NP) swabs in vial transport medium  Result Value Ref Range Status   SARS Coronavirus 2 by RT PCR NEGATIVE NEGATIVE Final    Comment: (NOTE) SARS-CoV-2 target nucleic acids are NOT DETECTED.  The SARS-CoV-2 RNA is generally detectable in upper respiratory specimens during the acute phase of infection. The lowest concentration of SARS-CoV-2 viral copies this assay can detect is 138 copies/mL. A  negative result does not preclude SARS-Cov-2 infection and should not be used as the sole basis for treatment or other patient management decisions. A negative result may occur with  improper specimen collection/handling, submission of specimen other than nasopharyngeal swab, presence of viral mutation(s) within the areas targeted by this assay, and inadequate number of viral copies(<138 copies/mL). A negative result must be combined with clinical observations, patient history, and epidemiological information. The expected result is Negative.  Fact Sheet for Patients:  EntrepreneurPulse.com.au  Fact Sheet for Healthcare Providers:  IncredibleEmployment.be  This test is no t yet approved or cleared by the [place] and  has been authorized for  detection and/or diagnosis of SARS-CoV-2 by FDA under an Emergency Use Authorization (EUA). This EUA will remain  in effect (meaning this test can be used) for the duration of the COVID-19 declaration under Section 564(b)(1) of the Act, 21 U.S.C.section 360bbb-3(b)(1), unless the authorization is terminated  or revoked sooner.       Influenza A by PCR NEGATIVE NEGATIVE Final   Influenza B by PCR NEGATIVE NEGATIVE Final    Comment: (NOTE) The Xpert Xpress SARS-CoV-2/FLU/RSV plus assay is intended as an aid in the diagnosis of influenza from Nasopharyngeal swab specimens and should not be used as a sole basis for treatment. Nasal washings and aspirates are unacceptable for Xpert Xpress SARS-CoV-2/FLU/RSV testing.  Fact Sheet for Patients: EntrepreneurPulse.com.au  Fact Sheet for Healthcare Providers: IncredibleEmployment.be  This test is not yet approved or cleared by the [place] FDA and has been authorized for detection and/or diagnosis of SARS-CoV-2 by FDA under an Emergency Use Authorization (EUA). This EUA will remain in effect (meaning this test can  be used) for the duration of the COVID-19 declaration under Section 564(b)(1) of the Act, 21 U.S.C. section 360bbb-3(b)(1), unless the authorization is terminated or revoked.  Performed at [facility], [address] [place], [place] [address]   MRSA Next Gen by PCR, Nasal     Status: Abnormal   Collection Time: 10/14/21  4:10 AM   Specimen: Nasal Aspirate; Nasal Swab  Result Value Ref Range Status   MRSA by PCR Next Gen DETECTED (A) NOT DETECTED Final    Comment: RESULT CALLED TO, READ BACK BY AND VERIFIED WITH: TEMEKA COLES AT 0071 10/14/21.PMF (NOTE) The GeneXpert MRSA Assay (FDA approved for NASAL specimens only), is one component of a comprehensive MRSA colonization surveillance program. It is not intended to diagnose MRSA infection nor to guide or monitor treatment for MRSA infections. Test performance is not FDA approved in patients less than 2 years old. Performed at [facility], [address] [place], [place] [address]   Aerobic/Anaerobic Culture w Gram Stain (surgical/deep wound)     Status: None (Preliminary result)   Collection Time: 10/14/21  4:10 AM   Specimen: Nose; Abscess  Result Value Ref Range Status   Specimen Description   Final    NOSE Performed at [facility], [address] [place], [place] [address]    Special Requests   Final    NONE Performed at [facility], [place], [place] [address]    Gram Stain NO WBC SEEN RARE GRAM POSITIVE COCCI   Final   Culture   Final    MODERATE STAPHYLOCOCCUS AUREUS CULTURE REINCUBATED FOR BETTER GROWTH Performed at [facility], [place] [address], [place], [place] [address]    Report Status PENDING  Incomplete         Radiology Studies: No results found.      Scheduled Meds:  amiodarone  200 mg Oral Daily   apixaban  5 mg Oral BID   atorvastatin  80 mg Oral Daily   clopidogrel  75 mg Oral Daily   dexamethasone (DECADRON) injection   3 mg Intravenous Q8H   docusate sodium  200 mg Oral BID   donepezil  10 mg Oral QHS   furosemide  20 mg Oral Daily   gentamicin ointment   Topical QID   insulin aspart  0-20 Units Subcutaneous TID WC   isosorbide mononitrate  30 mg Oral Daily   levothyroxine  88 mcg Oral  Daily   lisinopril  5 mg Oral Daily   metoprolol tartrate  100 mg Oral BID   sodium chloride flush  3 mL Intravenous Q12H   venlafaxine XR  75 mg Oral Q breakfast   Continuous Infusions:  sodium chloride 10 mL/hr at 10/16/21 0602   clindamycin (CLEOCIN) IV 600 mg (10/16/21 0603)      LOS: 6 days    Time spent: 15 mins     Wyvonnia Dusky, MD [facility] Pager [phone]  If 7PM-7AM, please contact night-coverage 10/16/2021, 7:52 AM

## 2021-10-16 NOTE — Progress Notes (Signed)
Occupational Therapy Treatment Patient Details Name: Alison Richardson MRN: [id number] DOB: Sep 06, 1933 Today's Date: 10/16/2021   History of present illness 87 y.o. female with a past medical history of atrial fibrillation on Eliquis and amiodarone, type 2 diabetes, hypertension, hyperlipidemia, hypothyroid who presented to the ED with complaints of chest discomfort occurring 10/17 at approximately 4 PM. Peak troponin 7649 consistent Non-ST elevation myocardial infarction. S/p Cardiac catheterization 10/12/21.   OT comments  Ms Oetken was seen for OT treatment on this date. Upon arrival to room pt reclined in bed, agreeable to tx. Pt requires MIN A don/doff mesh underwear at EOB - assist for threading over toes. MIN A + RW for toilet t/f - assist for posterior LOB standing from regular height and lift off assist from commode height. SUPERVISION perihygiene with lateral leans. Simulated navigating home environment carrying small palm sized item ~15 ft w/o AD - pt required MIN A to correct multiple posterior LOBs and noted to reach out for furniture support. Pt making good progress toward goals. Pt continues to benefit from skilled OT services to maximize return to PLOF and minimize risk of future falls, injury, caregiver burden, and readmission. Will continue to follow POC. Discharge recommendation remains appropriate.     Recommendations for follow up therapy are one component of a multi-disciplinary discharge planning process, led by the attending physician.  Recommendations may be updated based on patient status, additional functional criteria and insurance authorization.    Follow Up Recommendations  Skilled nursing-short term rehab (<3 hours/day)       Equipment Recommendations  BSC       Precautions / Restrictions Precautions Precautions: Fall Restrictions Weight Bearing Restrictions: No       Mobility Bed Mobility Overal bed mobility: Needs Assistance Bed Mobility: Supine to Sit      Supine to sit: Min assist          Transfers Overall transfer level: Needs assistance Equipment used: Rolling walker (2 wheels) Transfers: Sit to/from Stand Sit to Stand: Min assist                 Balance Overall balance assessment: Needs assistance Sitting-balance support: No upper extremity supported;Feet supported Sitting balance-Leahy Scale: Good     Standing balance support: During functional activity;Single extremity supported Standing balance-Leahy Scale: Fair                             ADL either performed or assessed with clinical judgement   ADL Overall ADL's : Needs assistance/impaired                                       General ADL Comments: MIN A + RW for toilet t/f - assist for mulitple posterior LOBs and lift off from commode. SUPERVISION perihygiene with lateral leans. MIN A don/doff mesh underwear at EOB - assist for threading over toes. Simulated navigating home environment carrying small palm sized item ~15 ft - pt required MIN A to correct multiple posterior LOBs and noted to reach out for furniture support.      Cognition Arousal/Alertness: Awake/alert Behavior During Therapy: WFL for tasks assessed/performed Overall Cognitive Status: Within Functional Limits for tasks assessed  Exercises Exercises: Other exercises Other Exercises Other Exercises: Pt educated re: OT role, DME recs, d/c recs, falls prevention, ECS Other Exercises: LBD, toileting, hand wahsing, sup>sit, sit<>stand, sitting/standing balance/tolerance, >50 ft mobility           Pertinent Vitals/ Pain       Pain Assessment: No/denies pain   Frequency  Min 1X/week        Progress Toward Goals  OT Goals(current goals can now be found in the care plan section)  Progress towards OT goals: Progressing toward goals  Acute Rehab OT Goals OT Goal Formulation: With patient Time  For Goal Achievement: 10/27/21 Potential to Achieve Goals: Good ADL Goals Pt Will Perform Grooming: with modified independence;standing Pt Will Perform Lower Body Dressing: Independently;sit to/from stand Pt Will Transfer to Toilet: with modified independence;ambulating;regular height toilet  Plan Discharge plan remains appropriate;Frequency remains appropriate    Co-evaluation                 AM-PAC OT "6 Clicks" Daily Activity     Outcome Measure   Help from another person eating meals?: A Little Help from another person taking care of personal grooming?: A Little Help from another person toileting, which includes using toliet, bedpan, or urinal?: A Little Help from another person bathing (including washing, rinsing, drying)?: A Little Help from another person to put on and taking off regular upper body clothing?: A Little Help from another person to put on and taking off regular lower body clothing?: A Little 6 Click Score: 18    End of Session Equipment Utilized During Treatment: Gait belt;Rolling walker (2 wheels)  OT Visit Diagnosis: Other abnormalities of gait and mobility (R26.89)   Activity Tolerance Patient tolerated treatment well   Patient Left in chair;with call bell/phone within reach;with chair alarm set   Nurse Communication          Time: 0100-7121 OT Time Calculation (min): 44 min  Charges: OT General Charges $OT Visit: 1 Visit OT Treatments $Self Care/Home Management : 23-37 mins $Therapeutic Activity: 8-22 mins  Dessie Coma, M.S. OTR/L  10/16/21, 11:34 AM  ascom [phone]

## 2021-10-16 NOTE — NC FL2 (Signed)
[place] LEVEL OF CARE SCREENING TOOL     IDENTIFICATION  Patient Name: Alison Richardson Birthdate: [birth year] Sex: female Admission Date (Current Location): 10/10/2021  [facility] and [place] Number:  Engineering geologist and Address:  [facility], [address], [place], [place] [address]      Provider Number: [id number]  Attending Physician Name and Address:  Wyvonnia Dusky, MD  Relative Name and Phone Number:  [name] (daughter) [phone]    Current Level of Care: Hospital Recommended Level of Care: [place] Prior Approval Number:    Date Approved/Denied:   PASRR Number: [id number]  Discharge Plan: SNF    Current Diagnoses: Patient Active Problem List   Diagnosis Date Noted   NSTEMI (non-ST elevated myocardial infarction) ([place]) 10/10/2021   Chronic diastolic CHF (congestive heart failure) ([place]) 10/13/2020   Acute CHF (congestive heart failure) ([place]) 10/07/2020   Elevated troponin 10/07/2020   CHF (congestive heart failure) ([place]) 10/07/2020   Leukocytosis 10/07/2020   DOE (dyspnea on exertion) 05/09/2020   Syncope and collapse 09/30/2019   Syncope 09/29/2019   Cor pulmonale, chronic ([place]) 09/27/2019   Late onset Alzheimer's disease without behavioral disturbance ([place]) 09/23/2019   Obstructive sleep apnea 07/09/2019   Diabetes mellitus type 2 in nonobese ([place]) 01/21/2019   Dyslipidemia 01/21/2019   PSVT (paroxysmal supraventricular tachycardia) ([place]) 01/21/2019   Tenosynovitis of foot 01/21/2019   PAD (peripheral artery disease) ([place]) 01/06/2019   Preop testing 01/06/2019   DM type 2 with diabetic mixed hyperlipidemia ([place]) 09/08/2018   Peripheral neuropathy, idiopathic 09/08/2018   Anxiety disorder 06/19/2018   Depression, major, single episode, mild (HCC) 06/19/2018   Paroxysmal atrial fibrillation with rapid ventricular response ([place]) 06/19/2018   Atrial flutter ([place]) 06/19/2018   Pure  hypercholesterolemia 06/19/2018   Vitamin B12 deficiency 06/19/2018   Insomnia 06/19/2018   Protein-calorie malnutrition ([place]) 06/19/2018   Type 2 diabetes mellitus with diabetic neuropathy ([place]) 06/19/2018   Knee pain 06/19/2018   Cognitive impairment, mild, so stated 06/19/2018   Osteoarthritis of right knee 06/19/2018   Benign essential hypertension 06/19/2018   Hypothyroidism 06/19/2018   Overactive bladder 06/19/2018   Generalized muscle weakness 06/19/2018   Difficulty in walking, not elsewhere classified 06/19/2018   Major depression in remission ([place]) 06/18/2018   Primary osteoarthritis involving multiple joints 06/18/2018   Mixed hyperlipidemia 11/01/2015   Cerebral aneurysm 04/29/2013    Orientation RESPIRATION BLADDER Height & Weight     Self, Time  Normal Incontinent, External catheter Weight: 170 lb (77.1 kg) Height:  5\' 6"  (167.6 cm)  BEHAVIORAL SYMPTOMS/MOOD NEUROLOGICAL BOWEL NUTRITION STATUS      Continent Diet (see discharge summary)  AMBULATORY STATUS COMMUNICATION OF NEEDS Skin   Limited Assist Verbally Normal                       Personal Care Assistance Level of Assistance  Bathing, Feeding, Total care, Dressing Bathing Assistance: Limited assistance Feeding assistance: Independent Dressing Assistance: Limited assistance Total Care Assistance: Limited assistance   Functional Limitations Info  Sight, Hearing, Speech Sight Info: Impaired Hearing Info: Adequate Speech Info: Adequate    SPECIAL CARE FACTORS FREQUENCY  PT (By licensed PT), OT (By licensed OT)     PT Frequency: min 4x weekly OT Frequency: min 4x weekly            Contractures Contractures Info: Not present    Additional Factors Info  Code Status, Allergies Code Status Info: DNR  Allergies Info: Pantoprazole, metformin and related           Current Medications (10/16/2021):  This is the current hospital active medication list Current Facility-Administered  Medications  Medication Dose Route Frequency Provider Last Rate Last Admin   0.9 %  sodium chloride infusion   Intravenous PRN Wyvonnia Dusky, MD 10 mL/hr at 10/16/21 0602 New Bag at 10/16/21 0602   acetaminophen (TYLENOL) tablet 650 mg  650 mg Oral Q6H PRN Jennye Boroughs, MD   650 mg at 10/14/21 0358   acetaminophen (TYLENOL) tablet 650 mg  650 mg Oral Q4H PRN Corey Skains, MD   650 mg at 10/14/21 2129   amiodarone (PACERONE) tablet 200 mg  200 mg Oral Daily Athena Masse, MD   200 mg at 10/16/21 0902   apixaban (ELIQUIS) tablet 5 mg  5 mg Oral BID Jettie Booze, PA-C   5 mg at 10/16/21 0901   atorvastatin (LIPITOR) tablet 80 mg  80 mg Oral Daily Corey Skains, MD   80 mg at 10/16/21 0901   clindamycin (CLEOCIN) IVPB 600 mg  600 mg Intravenous Q8H Wyvonnia Dusky, MD 100 mL/hr at 10/16/21 0603 600 mg at 10/16/21 0603   clopidogrel (PLAVIX) tablet 75 mg  75 mg Oral Daily Jettie Booze, PA-C   75 mg at 10/16/21 0902   dexamethasone (DECADRON) injection 3 mg  3 mg Intravenous Q8H Wyvonnia Dusky, MD   3 mg at 10/16/21 0543   docusate sodium (COLACE) capsule 200 mg  200 mg Oral BID Wyvonnia Dusky, MD   200 mg at 10/16/21 0902   donepezil (ARICEPT) tablet 10 mg  10 mg Oral QHS Athena Masse, MD   10 mg at 10/15/21 2116   furosemide (LASIX) tablet 20 mg  20 mg Oral Daily Athena Masse, MD   20 mg at 10/16/21 0901   gabapentin (NEURONTIN) capsule 100 mg  100 mg Oral QHS PRN Wyvonnia Dusky, MD   100 mg at 10/13/21 1353   gentamicin ointment (GARAMYCIN) 0.1 %   Topical QID Beverly Gust, MD   1 application at 58/85/02 0906   insulin aspart (novoLOG) injection 0-20 Units  0-20 Units Subcutaneous TID WC Wyvonnia Dusky, MD   11 Units at 10/16/21 7741   isosorbide mononitrate (IMDUR) 24 hr tablet 30 mg  30 mg Oral Daily Corey Skains, MD   30 mg at 10/16/21 0902   levothyroxine (SYNTHROID) tablet 88 mcg  88 mcg Oral Daily Athena Masse, MD   88 mcg at  10/16/21 0544   lisinopril (ZESTRIL) tablet 5 mg  5 mg Oral Daily Corey Skains, MD   5 mg at 10/16/21 0902   metoprolol tartrate (LOPRESSOR) tablet 100 mg  100 mg Oral BID Corey Skains, MD   100 mg at 10/16/21 0902   ondansetron (ZOFRAN) injection 4 mg  4 mg Intravenous Q6H PRN Corey Skains, MD       sodium chloride (OCEAN) 0.65 % nasal spray 1 spray  1 spray Each Nare PRN Jennye Boroughs, MD       sodium chloride flush (NS) 0.9 % injection 3 mL  3 mL Intravenous Q12H Corey Skains, MD   3 mL at 10/16/21 0904   traZODone (DESYREL) tablet 25 mg  25 mg Oral QHS PRN Mansy, Jan A, MD   25 mg at 10/15/21 2115   venlafaxine XR (EFFEXOR-XR) 24 hr capsule 75 mg  75 mg  Oral Q breakfast Lorna Dibble, RPH   75 mg at 10/16/21 0901     Discharge Medications: Please see discharge summary for a list of discharge medications.  Relevant Imaging Results:  Relevant Lab Results:   Additional Information SSN: [id number]  Alberteen Sam, LCSW

## 2021-10-17 DIAGNOSIS — E1169 Type 2 diabetes mellitus with other specified complication: Secondary | ICD-10-CM | POA: Diagnosis not present

## 2021-10-17 DIAGNOSIS — J340 Abscess, furuncle and carbuncle of nose: Secondary | ICD-10-CM | POA: Diagnosis not present

## 2021-10-17 DIAGNOSIS — I214 Non-ST elevation (NSTEMI) myocardial infarction: Secondary | ICD-10-CM | POA: Diagnosis not present

## 2021-10-17 LAB — GLUCOSE, CAPILLARY
Glucose-Capillary: 226 mg/dL — ABNORMAL HIGH (ref 70–99)
Glucose-Capillary: 217 mg/dL — ABNORMAL HIGH (ref 70–99)

## 2021-10-17 LAB — RESP PANEL BY RT-PCR (FLU A&B, COVID) ARPGX2
Influenza A by PCR: NEGATIVE
Influenza B by PCR: NEGATIVE
SARS Coronavirus 2 by RT PCR: NEGATIVE

## 2021-10-17 LAB — BASIC METABOLIC PANEL
Anion gap: 10 (ref 5–15)
BUN: 29 mg/dL — ABNORMAL HIGH (ref 8–23)
CO2: 24 mmol/L (ref 22–32)
Calcium: 8.5 mg/dL — ABNORMAL LOW (ref 8.9–10.3)
Chloride: 102 mmol/L (ref 98–111)
Creatinine, Ser: 0.75 mg/dL (ref 0.44–1.00)
GFR, Estimated: >60 mL/min (ref >60)
Glucose, Bld: 213 mg/dL — ABNORMAL HIGH (ref 70–99)
Potassium: 3.7 mmol/L (ref 3.5–5.1)
Sodium: 136 mmol/L (ref 135–145)

## 2021-10-17 LAB — CBC
HCT: 39.2 % (ref 36.0–46.0)
Hemoglobin: 13.2 g/dL (ref 12.0–15.0)
MCH: 30 pg (ref 26.0–34.0)
MCHC: 33.7 g/dL (ref 30.0–36.0)
MCV: 89.1 fL (ref 80.0–100.0)
Platelets: 308 10*3/uL (ref 150–400)
RBC: 4.4 MIL/uL (ref 3.87–5.11)
RDW: 13.6 % (ref 11.5–15.5)
WBC: 13.6 10*3/uL — ABNORMAL HIGH (ref 4.0–10.5)
nRBC: 0 % (ref 0.0–0.2)

## 2021-10-17 MED ORDER — GENTAMICIN SULFATE 0.1 % EX OINT: TOPICAL_OINTMENT | Freq: Four times a day (QID) | CUTANEOUS | 0 refills | Status: DC

## 2021-10-17 MED ORDER — METOPROLOL TARTRATE 100 MG PO TABS: 100.0000 mg | ORAL_TABLET | Freq: Two times a day (BID) | ORAL | 0 refills | Status: DC

## 2021-10-17 MED ORDER — CLOPIDOGREL BISULFATE 75 MG PO TABS: 75.0000 mg | ORAL_TABLET | Freq: Every day | ORAL | 0 refills | Status: AC

## 2021-10-17 MED ORDER — PREDNISONE 10 MG PO TABS: ORAL_TABLET | ORAL | 0 refills | Status: DC

## 2021-10-17 MED ORDER — INSULIN ASPART 100 UNIT/ML IJ SOLN
4.0000 [IU] | Freq: Three times a day (TID) | INTRAMUSCULAR | Status: DC
  Administered 2021-10-17: 4 [IU] via SUBCUTANEOUS
  Filled 2021-10-17: qty 1

## 2021-10-17 MED ORDER — CLINDAMYCIN HCL 300 MG PO CAPS: 300.0000 mg | ORAL_CAPSULE | Freq: Three times a day (TID) | ORAL | 0 refills | Status: AC

## 2021-10-17 MED ORDER — LISINOPRIL 5 MG PO TABS: 5.0000 mg | ORAL_TABLET | Freq: Every day | ORAL | 0 refills | Status: DC

## 2021-10-17 MED ORDER — ISOSORBIDE MONONITRATE ER 30 MG PO TB24: 30.0000 mg | ORAL_TABLET | Freq: Every day | ORAL | 0 refills | Status: DC

## 2021-10-17 NOTE — Plan of Care (Signed)
  Problem: Education: Goal: Knowledge of General Education information will improve Description: Including pain rating scale, medication(s)/side effects and non-pharmacologic comfort measures 10/17/2021 1253 by Cristela Blue, RN Outcome: Progressing 10/17/2021 1253 by Cristela Blue, RN Outcome: Progressing   Problem: Health Behavior/Discharge Planning: Goal: Ability to manage health-related needs will improve 10/17/2021 1253 by Cristela Blue, RN Outcome: Progressing 10/17/2021 1253 by Cristela Blue, RN Outcome: Progressing   Problem: Clinical Measurements: Goal: Ability to maintain clinical measurements within normal limits will improve 10/17/2021 1253 by Cristela Blue, RN Outcome: Progressing 10/17/2021 1253 by Cristela Blue, RN Outcome: Progressing Goal: Will remain free from infection 10/17/2021 1253 by Cristela Blue, RN Outcome: Progressing 10/17/2021 1253 by Cristela Blue, RN Outcome: Progressing Goal: Diagnostic test results will improve 10/17/2021 1253 by Cristela Blue, RN Outcome: Progressing 10/17/2021 1253 by Cristela Blue, RN Outcome: Progressing Goal: Respiratory complications will improve 10/17/2021 1253 by Cristela Blue, RN Outcome: Progressing 10/17/2021 1253 by Cristela Blue, RN Outcome: Progressing Goal: Cardiovascular complication will be avoided 10/17/2021 1253 by Cristela Blue, RN Outcome: Progressing 10/17/2021 1253 by Cristela Blue, RN Outcome: Progressing   Problem: Activity: Goal: Risk for activity intolerance will decrease 10/17/2021 1253 by Cristela Blue, RN Outcome: Progressing 10/17/2021 1253 by Cristela Blue, RN Outcome: Progressing   Problem: Nutrition: Goal: Adequate nutrition will be maintained 10/17/2021 1253 by Cristela Blue, RN Outcome: Progressing 10/17/2021 1253 by Cristela Blue, RN Outcome: Progressing   Problem: Coping: Goal: Level of anxiety will decrease 10/17/2021 1253 by Cristela Blue, RN Outcome:  Progressing 10/17/2021 1253 by Cristela Blue, RN Outcome: Progressing   Problem: Elimination: Goal: Will not experience complications related to bowel motility 10/17/2021 1253 by Cristela Blue, RN Outcome: Progressing 10/17/2021 1253 by Cristela Blue, RN Outcome: Progressing Goal: Will not experience complications related to urinary retention 10/17/2021 1253 by Cristela Blue, RN Outcome: Progressing 10/17/2021 1253 by Cristela Blue, RN Outcome: Progressing   Problem: Pain Managment: Goal: General experience of comfort will improve 10/17/2021 1253 by Cristela Blue, RN Outcome: Progressing 10/17/2021 1253 by Cristela Blue, RN Outcome: Progressing   Problem: Safety: Goal: Ability to remain free from injury will improve 10/17/2021 1253 by Cristela Blue, RN Outcome: Progressing 10/17/2021 1253 by Cristela Blue, RN Outcome: Progressing   Problem: Skin Integrity: Goal: Risk for impaired skin integrity will decrease 10/17/2021 1253 by Cristela Blue, RN Outcome: Progressing 10/17/2021 1253 by Cristela Blue, RN Outcome: Progressing

## 2021-10-17 NOTE — Discharge Summary (Signed)
Physician Discharge Summary  Alison Richardson QAS:[id number] DOB: 1933-08-28 DOA: 10/10/2021  PCP: Rusty Aus, MD  Admit date: 10/10/2021 Discharge date: 10/17/2021  Admitted From: ALF  Disposition:  SNF  Recommendations for Outpatient Follow-up:  Follow up with PCP in 1-2 weeks F/u w/ cardio, Dr. Nehemiah Massed, in 1-2 weeks F/u w/ ENT, Dr. Tami Ribas, in 1 week  Home Health: no  Equipment/Devices:  Discharge Condition: stable  CODE STATUS: DNR  Diet recommendation: Heart Healthy / Carb Modified   Brief/Interim Summary: HPI was taken from Dr. Damita Dunnings: Alison Richardson is a 87 y.o. female with medical history significant for Atrial fibrillation on Eliquis and amiodarone, diabetes, hypertension, hypothyroidism and dementia who presented to the ED earlier in the day on 10/17 with chest pain leaving after waiting for several hours but returned after her first troponin resulting in the 700s.  She stated that she was in her usual state of health when she had a dull chest pain across her anterior chest radiating to the jaw associated with a feeling of clamminess and nausea.  She denied associated shortness of breath or lightheadedness.  She stated that when she left the ED the first time her pain had already gone and had not returned by the time she was called back to the ED .  She currently denies chest pain, shortness of breath, nausea, palpitations or lightheadedness.   ED course: On arrival vitals within normal limits however patient went into RVR with rate in the 120s Blood work troponin: 716> [phone] with otherwise unremarkable blood work   EKG, personally viewed and interpreted NSR at 61 with first-degree AV block and no acute ST-T wave changes EKG #2 A. fib with incomplete left bundle rate of 122 still with no acute EKG changes   Imaging: Chest x-ray done during first visit to the ED no acute cardiopulmonary disease   Patient started on a heparin infusion given chewable aspirin and also given  her home dose of metoprolol and amiodarone.  Hospitalist consulted for admission.  Chronic diastolic CHF chronic diastolic   Hospital course from Dr. Jimmye Norman 10/19-10/25/22: Pt was found to have NSTEMI and had a cardiac cath which showed diffuse one vessel  coronary atherosclerosis as per cardio. No stent was placed and pt was medically managed w/ plavix as per cardio. Please see cardio notes for more information. Of note, pt was found to have a nasal abscess and pt was started on IV clinadmycin, steroids & gentamicin ointment as per ENT. S/p aspiration of abscess that grew MRSA. Pt was d/c home w/ po clindamycin, prednisone taper and gentamicin as per ENT. Pt will f/u outpatient w/ ENT in 1-2 weeks. PT/OT evaluated the pt and recommended SNF. For more information, please see previous progress/consults notes.    Discharge Diagnoses:  Principal Problem:   NSTEMI (non-ST elevated myocardial infarction) ([place]) Active Problems:   Paroxysmal atrial fibrillation with rapid ventricular response (HCC)   Type 2 diabetes mellitus with diabetic neuropathy (HCC)   Cognitive impairment, mild, so stated   Benign essential hypertension   Hypothyroidism   Chronic diastolic CHF (congestive heart failure) ([place])  NSTEMI: w/ chest pain, elevated troponins. S/p cardiac cath which showed diffuse one vessel coronary atherosclerosis as per cardio. Continue on plavix as per cardio    PAF: w/ RVR. Continue on metoprolol, amio & eliquis    Nasal abscess: continue on clindamycin, steroids & gentamicin ointment as per ENT. Wound cx growing MRSA. S/p aspiration of abscess by ENT. MRSA screen was positive.  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 Previously known to pt. Discussed w/ Dr. Lacinda Axon, neuro surg, and pt can f/u outpatient. Not a good surgical candidate and not a high risk of rupture as per neuro surg. Neuro surg recs apprec    Generalized weakness: PT/OT recs SNF. Pt is going back to  [facility] but the SNF side and pt is agreeable    DM2: HbA1c 8.5, poorly controlled. Continue on SSI w/ accuchecks. BS are elevated secondary to steroid use    HTN: continue on BB    Chronic diastolic CHF: continue on metoprolol, lasix. Monitor I/Os   continue on lasix, metoprolol. Monitor I/Os    Cognitive impairment: continue on home dose of donepezil    Hypothyroidism: continue on levothyroxine     Leukocytosis: likely secondary to steroid use & infection    Hypokalemia: WNL today   Discharge Instructions  Discharge Instructions     AMB Referral to Cardiac Rehabilitation - Phase II   Complete by: As directed    Diagnosis: NSTEMI   Diet - low sodium heart healthy   Complete by: As directed    Discharge instructions   Complete by: As directed    F/u w/ PCP in 1-2 weeks. F/u w/ ENT, Dr. Tami Ribas, in 1-2 weeks. F/u w/ cardio, Dr. Nehemiah Massed, in 1 week   Increase activity slowly   Complete by: As directed       Allergies as of 10/17/2021       Reactions   Pantoprazole Nausea And Vomiting   Metformin And Related Diarrhea        Medication List     STOP taking these medications    ibuprofen 200 MG tablet Commonly known as: ADVIL       TAKE these medications    amiodarone 200 MG tablet Commonly known as: PACERONE Take 200 mg by mouth daily.   cetirizine 10 MG tablet Commonly known as: ZYRTEC Take 10 mg by mouth daily.   ciclopirox 8 % solution Commonly known as: PENLAC Apply topically at bedtime. Apply to nail/surrounding skin and daily over previous coat. Every 7 days remove with alcohol and continue.   clindamycin 300 MG capsule Commonly known as: CLEOCIN Take 1 capsule (300 mg total) by mouth 3 (three) times daily for 8 days.   clopidogrel 75 MG tablet Commonly known as: PLAVIX Take 1 tablet (75 mg total) by mouth daily. Start taking on: October 18, 2021   donepezil 10 MG tablet Commonly known as: ARICEPT Take 10 mg by mouth at bedtime.    Eliquis 5 MG Tabs tablet Generic drug: apixaban Take 5 mg by mouth 2 (two) times daily.   furosemide 20 MG tablet Commonly known as: LASIX Take 1 tablet (20 mg total) by mouth daily.   gabapentin 100 MG capsule Commonly known as: NEURONTIN Take 100 mg by mouth at bedtime as needed.   gentamicin ointment 0.1 % Commonly known as: GARAMYCIN Apply topically 4 (four) times daily.   glipiZIDE 2.5 MG 24 hr tablet Commonly known as: GLUCOTROL XL Take 2.5 mg by mouth daily.   isosorbide mononitrate 30 MG 24 hr tablet Commonly known as: IMDUR Take 1 tablet (30 mg total) by mouth daily. Start taking on: October 18, 2021   levothyroxine 88 MCG tablet Commonly known as: SYNTHROID Take 88 mcg by mouth daily.   lisinopril 5 MG tablet Commonly known as: ZESTRIL Take 1 tablet (5 mg total) by mouth  daily. Start taking on: October 18, 2021   metoprolol tartrate 100 MG tablet Commonly known as: LOPRESSOR Take 1 tablet (100 mg total) by mouth 2 (two) times daily. What changed:  medication strength how much to take   potassium chloride 10 MEQ CR capsule Commonly known as: MICRO-K Take 10 mEq by mouth 2 (two) times daily.   pravastatin 20 MG tablet Commonly known as: PRAVACHOL Take 20 mg by mouth at bedtime.   predniSONE 10 MG tablet Commonly known as: DELTASONE 40 mg daily x 2 days, 30 mg daily x 2 days, 20 mg daily x 2 days, 10 mg daily x 2 days then stop   venlafaxine XR 75 MG 24 hr capsule Commonly known as: EFFEXOR-XR Take 75 mg by mouth daily with breakfast.        Follow-up Information     Corey Skains, MD Follow up in 1 week(s).   Specialty: Cardiology Contact information: [address] [facility] [place] [address] [phone]                Allergies  Allergen Reactions   Pantoprazole Nausea And Vomiting   Metformin And Related Diarrhea    Consultations: Cardio ENT    Procedures/Studies: DG Chest 2  View  Result Date: 10/09/2021 CLINICAL DATA:  Chest pain. EXAM: CHEST - 2 VIEW COMPARISON:  Chest x-ray dated October 06, 2020. FINDINGS: The heart size and mediastinal contours are within normal limits. Normal pulmonary vascularity. No focal consolidation, pleural effusion, or pneumothorax. No acute osseous abnormality. IMPRESSION: 1. No acute cardiopulmonary disease. Electronically Signed   By: Titus Dubin M.D.   On: 10/09/2021 18:36   CARDIAC CATHETERIZATION  Result Date: 10/12/2021   Ost RCA to Prox RCA lesion is 99% stenosed.   Dist RCA lesion is 65% stenosed.   RPDA lesion is 60% stenosed.   1st Diag lesion is 99% stenosed.   Ost Cx to Prox Cx lesion is 25% stenosed.   Ost LAD to Prox LAD lesion is 25% stenosed.   There is mild left ventricular systolic dysfunction.   LV end diastolic pressure is mildly elevated.   The left ventricular ejection fraction is 35-45% by visual estimate. 87 year old female with known chronic nonvalvular atrial fibrillation hypertension hyperlipidemia with acute non-ST elevation myocardial infarction Cardiac catheterization showing mild inferior hypokinesis and apical hypokinesis with ejection fraction of 40 to 45% Severe and/or critical stenosis of ostial right coronary artery and moderate atherosclerosis of distal right coronary artery and PDA complex in nature with good collaterals from left anterior descending artery Mild atherosclerosis of left anterior descending artery and circumflex artery After discussion with cardiovascular team would best be medically manage at this time due to complex lesions and not ideal for PCI and stent placement Plan Isosorbide for better collateral flow 2.  Increase beta-blocker for better heart rate control of atrial fibrillation with a goal heart rate between 60 and 90 bpm 3.  Reinstate anticoagulation for further risk reduction and stroke with atrial fibrillation 4.  Single antiplatelet therapy with Plavix 5.  Hypertension control  and high intensity cholesterol therapy 6.  Cardiac rehabilitation   CT MAXILLOFACIAL W CONTRAST  Result Date: 10/13/2021 CLINICAL DATA:  Maxillary and facial abscess of the nose. EXAM: CT MAXILLOFACIAL WITH CONTRAST TECHNIQUE: Multidetector CT imaging of the maxillofacial structures was performed with intravenous contrast. Multiplanar CT image reconstructions were also generated. CONTRAST:  38mL OMNIPAQUE IOHEXOL 300 MG/ML  SOLN COMPARISON:  Head CT 09/29/2019 FINDINGS: Osseous:  No regional osseous abnormality. Orbits: Orbits are normal. Sinuses: Sinuses are clear. Soft tissues: Swelling of the tip of the nose with a E peripherally enhancing abscess at the tip extending somewhat towards the left measuring approximately 1.5 cm in diameter. Other soft tissues of the facial region appear normal. Limited intracranial: Redemonstration of a 12 mm right MCA aneurysm. This is a high risk aneurysm. Has this been evaluated by a neurovascular specialist? IMPRESSION: Inflammation of the nose with a 1.5 cm abscess at the tip centrally extending slightly towards the left. 12 mm right MCA aneurysm. This is at high risk of rupture. Has this been evaluated by a neurovascular specialist? Electronically Signed   By: Nelson Chimes M.D.   On: 10/13/2021 16:05   (Echo, Carotid, EGD, Colonoscopy, ERCP)    Subjective: Pt c/o fatigue    Discharge Exam: Vitals:   10/17/21 0732 10/17/21 1150  BP: 124/85 (!) 97/55  Pulse: 74 79  Resp: 16 17  Temp: 98.1 F (36.7 C) 98 F (36.7 C)  SpO2: 98% 96%   Vitals:   10/17/21 0000 10/17/21 0500 10/17/21 0732 10/17/21 1150  BP: 101/68 108/72 124/85 (!) 97/55  Pulse: 83 89 74 79  Resp:   16 17  Temp: (!) 97.5 F (36.4 C) 97.9 F (36.6 C) 98.1 F (36.7 C) 98 F (36.7 C)  TempSrc: Oral Oral  Oral  SpO2: 95% 95% 98% 96%  Weight:      Height:        General: Pt is alert, awake, not in acute distress Cardiovascular: S1/S2 +, no rubs, no gallops Respiratory: CTA  bilaterally, no wheezing, no rhonchi Abdominal: Soft, NT, ND, bowel sounds + Extremities:  no cyanosis    The results of significant diagnostics from this hospitalization (including imaging, microbiology, ancillary and laboratory) are listed below for reference.     Microbiology: Recent Results (from the past 240 hour(s))  Resp Panel by RT-PCR (Flu A&B, Covid) Nasopharyngeal Swab     Status: None   Collection Time: 10/10/21  3:02 AM   Specimen: Nasopharyngeal Swab; Nasopharyngeal(NP) swabs in vial transport medium  Result Value Ref Range Status   SARS Coronavirus 2 by RT PCR NEGATIVE NEGATIVE Final    Comment: (NOTE) SARS-CoV-2 target nucleic acids are NOT DETECTED.  The SARS-CoV-2 RNA is generally detectable in upper respiratory specimens during the acute phase of infection. The lowest concentration of SARS-CoV-2 viral copies this assay can detect is 138 copies/mL. A negative result does not preclude SARS-Cov-2 infection and should not be used as the sole basis for treatment or other patient management decisions. A negative result may occur with  improper specimen collection/handling, submission of specimen other than nasopharyngeal swab, presence of viral mutation(s) within the areas targeted by this assay, and inadequate number of viral copies(<138 copies/mL). A negative result must be combined with clinical observations, patient history, and epidemiological information. The expected result is Negative.  Fact Sheet for Patients:  EntrepreneurPulse.com.au  Fact Sheet for Healthcare Providers:  IncredibleEmployment.be  This test is no t yet approved or cleared by the [place] FDA and  has been authorized for detection and/or diagnosis of SARS-CoV-2 by FDA under an Emergency Use Authorization (EUA). This EUA will remain  in effect (meaning this test can be used) for the duration of the COVID-19 declaration under Section 564(b)(1) of  the Act, 21 U.S.C.section 360bbb-3(b)(1), unless the authorization is terminated  or revoked sooner.       Influenza A by PCR NEGATIVE NEGATIVE  Final   Influenza B by PCR NEGATIVE NEGATIVE Final    Comment: (NOTE) The Xpert Xpress SARS-CoV-2/FLU/RSV plus assay is intended as an aid in the diagnosis of influenza from Nasopharyngeal swab specimens and should not be used as a sole basis for treatment. Nasal washings and aspirates are unacceptable for Xpert Xpress SARS-CoV-2/FLU/RSV testing.  Fact Sheet for Patients: EntrepreneurPulse.com.au  Fact Sheet for Healthcare Providers: IncredibleEmployment.be  This test is not yet approved or cleared by the [place] FDA and has been authorized for detection and/or diagnosis of SARS-CoV-2 by FDA under an Emergency Use Authorization (EUA). This EUA will remain in effect (meaning this test can be used) for the duration of the COVID-19 declaration under Section 564(b)(1) of the Act, 21 U.S.C. section 360bbb-3(b)(1), unless the authorization is terminated or revoked.  Performed at [facility], [address] [place], [place] [address]   MRSA Next Gen by PCR, Nasal     Status: Abnormal   Collection Time: 10/14/21  4:10 AM   Specimen: Nasal Aspirate; Nasal Swab  Result Value Ref Range Status   MRSA by PCR Next Gen DETECTED (A) NOT DETECTED Final    Comment: RESULT CALLED TO, READ BACK BY AND VERIFIED WITH: TEMEKA COLES AT 0102 10/14/21.PMF (NOTE) The GeneXpert MRSA Assay (FDA approved for NASAL specimens only), is one component of a comprehensive MRSA colonization surveillance program. It is not intended to diagnose MRSA infection nor to guide or monitor treatment for MRSA infections. Test performance is not FDA approved in patients less than 20 years old. Performed at [facility], [address] [place], [place] [address]   Aerobic/Anaerobic Culture w Gram Stain  (surgical/deep wound)     Status: None (Preliminary result)   Collection Time: 10/14/21  4:10 AM   Specimen: Nose; Abscess  Result Value Ref Range Status   Specimen Description   Final    NOSE Performed at [facility], [address], [place], [place] [address]    Special Requests   Final    NONE Performed at [facility], [place], [place] [address]    Gram Stain   Final    NO WBC SEEN RARE GRAM POSITIVE COCCI Performed at [facility], [address], [place], [place] [address]    Culture   Final    MODERATE METHICILLIN RESISTANT STAPHYLOCOCCUS AUREUS NO ANAEROBES ISOLATED; CULTURE IN PROGRESS FOR 5 DAYS    Report Status PENDING  Incomplete   Organism ID, Bacteria METHICILLIN RESISTANT STAPHYLOCOCCUS AUREUS  Final      Susceptibility   Methicillin resistant staphylococcus aureus - MIC*    CIPROFLOXACIN >=8 RESISTANT Resistant     ERYTHROMYCIN >=8 RESISTANT Resistant     GENTAMICIN <=0.5 SENSITIVE Sensitive     OXACILLIN >=4 RESISTANT Resistant     TETRACYCLINE <=1 SENSITIVE Sensitive     VANCOMYCIN 1 SENSITIVE Sensitive     TRIMETH/SULFA <=10 SENSITIVE Sensitive     CLINDAMYCIN <=0.25 SENSITIVE Sensitive     RIFAMPIN <=0.5 SENSITIVE Sensitive     Inducible Clindamycin NEGATIVE Sensitive     * MODERATE METHICILLIN RESISTANT STAPHYLOCOCCUS AUREUS  Resp Panel by RT-PCR (Flu A&B, Covid) Nasopharyngeal Swab     Status: None   Collection Time: 10/16/21 10:53 AM   Specimen: Nasopharyngeal Swab; Nasopharyngeal(NP) swabs in vial transport medium  Result Value Ref Range Status   SARS Coronavirus 2 by RT PCR NEGATIVE NEGATIVE Final    Comment: (NOTE) SARS-CoV-2 target nucleic acids  are NOT DETECTED.  The SARS-CoV-2 RNA is generally detectable in upper respiratory specimens during the acute phase of infection. The lowest concentration of SARS-CoV-2 viral copies this assay can detect is 138 copies/mL. A negative result does not preclude  SARS-Cov-2 infection and should not be used as the sole basis for treatment or other patient management decisions. A negative result may occur with  improper specimen collection/handling, submission of specimen other than nasopharyngeal swab, presence of viral mutation(s) within the areas targeted by this assay, and inadequate number of viral copies(<138 copies/mL). A negative result must be combined with clinical observations, patient history, and epidemiological information. The expected result is Negative.  Fact Sheet for Patients:  EntrepreneurPulse.com.au  Fact Sheet for Healthcare Providers:  IncredibleEmployment.be  This test is no t yet approved or cleared by the [place] FDA and  has been authorized for detection and/or diagnosis of SARS-CoV-2 by FDA under an Emergency Use Authorization (EUA). This EUA will remain  in effect (meaning this test can be used) for the duration of the COVID-19 declaration under Section 564(b)(1) of the Act, 21 U.S.C.section 360bbb-3(b)(1), unless the authorization is terminated  or revoked sooner.       Influenza A by PCR NEGATIVE NEGATIVE Final   Influenza B by PCR NEGATIVE NEGATIVE Final    Comment: (NOTE) The Xpert Xpress SARS-CoV-2/FLU/RSV plus assay is intended as an aid in the diagnosis of influenza from Nasopharyngeal swab specimens and should not be used as a sole basis for treatment. Nasal washings and aspirates are unacceptable for Xpert Xpress SARS-CoV-2/FLU/RSV testing.  Fact Sheet for Patients: EntrepreneurPulse.com.au  Fact Sheet for Healthcare Providers: IncredibleEmployment.be  This test is not yet approved or cleared by the [place] FDA and has been authorized for detection and/or diagnosis of SARS-CoV-2 by FDA under an Emergency Use Authorization (EUA). This EUA will remain in effect (meaning this test can be used) for the duration of  the COVID-19 declaration under Section 564(b)(1) of the Act, 21 U.S.C. section 360bbb-3(b)(1), unless the authorization is terminated or revoked.  Performed at [facility], [address] [place], [place] [address]      Labs: BNP (last 3 results) No results for input(s): BNP in the last 8760 hours. Basic Metabolic Panel: Recent Labs  Lab 10/12/21 0400 10/13/21 0518 10/14/21 0414 10/15/21 0430 10/16/21 0456 10/17/21 0608  NA 134* 137 138 136 136 136  K 3.5 3.6 3.7 3.7 3.9 3.7  CL 102 105 105 106 102 102  CO2 23 23 25 24  21* 24  GLUCOSE 236* 235* 219* 371* 265* 213*  BUN 8 11 11 22  28* 29*  CREATININE 0.54 0.58 0.59 0.65 0.87 0.75  CALCIUM 8.7* 8.4* 8.4* 8.6* 8.7* 8.5*  MG 2.1 1.9 1.9 1.9 1.7  --    Liver Function Tests: No results for input(s): AST, ALT, ALKPHOS, BILITOT, PROT, ALBUMIN in the last 168 hours. No results for input(s): LIPASE, AMYLASE in the last 168 hours. No results for input(s): AMMONIA in the last 168 hours. CBC: Recent Labs  Lab 10/13/21 0518 10/14/21 0414 10/15/21 0430 10/16/21 0456 10/17/21 0608  WBC 13.5* 14.8* 12.5* 16.8* 13.6*  HGB 13.4 14.5 13.2 12.7 13.2  HCT 39.9 40.9 38.7 36.7 39.2  MCV 90.7 92.3 89.8 89.5 89.1  PLT 236 239 266 297 308   Cardiac Enzymes: No results for input(s): CKTOTAL, CKMB, CKMBINDEX, TROPONINI in the last 168 hours. BNP: Invalid input(s): POCBNP CBG: Recent Labs  Lab 10/16/21 1151 10/16/21 1625 10/16/21 2042  10/17/21 0816 10/17/21 1145  GLUCAP 258* 233* 257* 226* 217*   D-Dimer No results for input(s): DDIMER in the last 72 hours. Hgb A1c Recent Labs    10/16/21 0456  HGBA1C 7.7*   Lipid Profile No results for input(s): CHOL, HDL, LDLCALC, TRIG, CHOLHDL, LDLDIRECT in the last 72 hours. Thyroid function studies No results for input(s): TSH, T4TOTAL, T3FREE, THYROIDAB in the last 72 hours.  Invalid input(s): FREET3 Anemia work up No results for input(s): VITAMINB12, FOLATE,  FERRITIN, TIBC, IRON, RETICCTPCT in the last 72 hours. Urinalysis    Component Value Date/Time   COLORURINE YELLOW (A) 10/09/2021 1734   APPEARANCEUR HAZY (A) 10/09/2021 1734   LABSPEC 1.021 10/09/2021 1734   PHURINE 5.0 10/09/2021 1734   GLUCOSEU 150 (A) 10/09/2021 1734   HGBUR SMALL (A) 10/09/2021 1734   BILIRUBINUR NEGATIVE 10/09/2021 1734   KETONESUR 5 (A) 10/09/2021 1734   PROTEINUR 100 (A) 10/09/2021 1734   NITRITE NEGATIVE 10/09/2021 1734   LEUKOCYTESUR SMALL (A) 10/09/2021 1734   Sepsis Labs Invalid input(s): PROCALCITONIN,  WBC,  LACTICIDVEN Microbiology Recent Results (from the past 240 hour(s))  Resp Panel by RT-PCR (Flu A&B, Covid) Nasopharyngeal Swab     Status: None   Collection Time: 10/10/21  3:02 AM   Specimen: Nasopharyngeal Swab; Nasopharyngeal(NP) swabs in vial transport medium  Result Value Ref Range Status   SARS Coronavirus 2 by RT PCR NEGATIVE NEGATIVE Final    Comment: (NOTE) SARS-CoV-2 target nucleic acids are NOT DETECTED.  The SARS-CoV-2 RNA is generally detectable in upper respiratory specimens during the acute phase of infection. The lowest concentration of SARS-CoV-2 viral copies this assay can detect is 138 copies/mL. A negative result does not preclude SARS-Cov-2 infection and should not be used as the sole basis for treatment or other patient management decisions. A negative result may occur with  improper specimen collection/handling, submission of specimen other than nasopharyngeal swab, presence of viral mutation(s) within the areas targeted by this assay, and inadequate number of viral copies(<138 copies/mL). A negative result must be combined with clinical observations, patient history, and epidemiological information. The expected result is Negative.  Fact Sheet for Patients:  EntrepreneurPulse.com.au  Fact Sheet for Healthcare Providers:  IncredibleEmployment.be  This test is no t yet approved  or cleared by the [place] FDA and  has been authorized for detection and/or diagnosis of SARS-CoV-2 by FDA under an Emergency Use Authorization (EUA). This EUA will remain  in effect (meaning this test can be used) for the duration of the COVID-19 declaration under Section 564(b)(1) of the Act, 21 U.S.C.section 360bbb-3(b)(1), unless the authorization is terminated  or revoked sooner.       Influenza A by PCR NEGATIVE NEGATIVE Final   Influenza B by PCR NEGATIVE NEGATIVE Final    Comment: (NOTE) The Xpert Xpress SARS-CoV-2/FLU/RSV plus assay is intended as an aid in the diagnosis of influenza from Nasopharyngeal swab specimens and should not be used as a sole basis for treatment. Nasal washings and aspirates are unacceptable for Xpert Xpress SARS-CoV-2/FLU/RSV testing.  Fact Sheet for Patients: EntrepreneurPulse.com.au  Fact Sheet for Healthcare Providers: IncredibleEmployment.be  This test is not yet approved or cleared by the [place] FDA and has been authorized for detection and/or diagnosis of SARS-CoV-2 by FDA under an Emergency Use Authorization (EUA). This EUA will remain in effect (meaning this test can be used) for the duration of the COVID-19 declaration under Section 564(b)(1) of the Act, 21 U.S.C. section 360bbb-3(b)(1), unless the authorization  is terminated or revoked.  Performed at [facility], [address] [place], [place] [address]   MRSA Next Gen by PCR, Nasal     Status: Abnormal   Collection Time: 10/14/21  4:10 AM   Specimen: Nasal Aspirate; Nasal Swab  Result Value Ref Range Status   MRSA by PCR Next Gen DETECTED (A) NOT DETECTED Final    Comment: RESULT CALLED TO, READ BACK BY AND VERIFIED WITH: TEMEKA COLES AT 8032 10/14/21.PMF (NOTE) The GeneXpert MRSA Assay (FDA approved for NASAL specimens only), is one component of a comprehensive MRSA colonization surveillance program. It is not  intended to diagnose MRSA infection nor to guide or monitor treatment for MRSA infections. Test performance is not FDA approved in patients less than 33 years old. Performed at [facility], [address] [place], [place] [address]   Aerobic/Anaerobic Culture w Gram Stain (surgical/deep wound)     Status: None (Preliminary result)   Collection Time: 10/14/21  4:10 AM   Specimen: Nose; Abscess  Result Value Ref Range Status   Specimen Description   Final    NOSE Performed at [facility], [address], [place], [place] [address]    Special Requests   Final    NONE Performed at [facility], [place], [place] [address]    Gram Stain   Final    NO WBC SEEN RARE GRAM POSITIVE COCCI Performed at [facility], [place] [address] [place], [place] [address]    Culture   Final    MODERATE METHICILLIN RESISTANT STAPHYLOCOCCUS AUREUS NO ANAEROBES ISOLATED; CULTURE IN PROGRESS FOR 5 DAYS    Report Status PENDING  Incomplete   Organism ID, Bacteria METHICILLIN RESISTANT STAPHYLOCOCCUS AUREUS  Final      Susceptibility   Methicillin resistant staphylococcus aureus - MIC*    CIPROFLOXACIN >=8 RESISTANT Resistant     ERYTHROMYCIN >=8 RESISTANT Resistant     GENTAMICIN <=0.5 SENSITIVE Sensitive     OXACILLIN >=4 RESISTANT Resistant     TETRACYCLINE <=1 SENSITIVE Sensitive     VANCOMYCIN 1 SENSITIVE Sensitive     TRIMETH/SULFA <=10 SENSITIVE Sensitive     CLINDAMYCIN <=0.25 SENSITIVE Sensitive     RIFAMPIN <=0.5 SENSITIVE Sensitive     Inducible Clindamycin NEGATIVE Sensitive     * MODERATE METHICILLIN RESISTANT STAPHYLOCOCCUS AUREUS  Resp Panel by RT-PCR (Flu A&B, Covid) Nasopharyngeal Swab     Status: None   Collection Time: 10/16/21 10:53 AM   Specimen: Nasopharyngeal Swab; Nasopharyngeal(NP) swabs in vial transport medium  Result Value Ref Range Status   SARS Coronavirus 2 by RT PCR NEGATIVE NEGATIVE Final    Comment:  (NOTE) SARS-CoV-2 target nucleic acids are NOT DETECTED.  The SARS-CoV-2 RNA is generally detectable in upper respiratory specimens during the acute phase of infection. The lowest concentration of SARS-CoV-2 viral copies this assay can detect is 138 copies/mL. A negative result does not preclude SARS-Cov-2 infection and should not be used as the sole basis for treatment or other patient management decisions. A negative result may occur with  improper specimen collection/handling, submission of specimen other than nasopharyngeal swab, presence of viral mutation(s) within the areas targeted by this assay, and inadequate number of viral copies(<138 copies/mL). A negative result must be combined with clinical observations, patient history, and epidemiological information. The expected result is Negative.  Fact Sheet for Patients:  EntrepreneurPulse.com.au  Fact Sheet for Healthcare Providers:  IncredibleEmployment.be  This test is no t  yet approved or cleared by the [place] and  has been authorized for detection and/or diagnosis of SARS-CoV-2 by FDA under an Emergency Use Authorization (EUA). This EUA will remain  in effect (meaning this test can be used) for the duration of the COVID-19 declaration under Section 564(b)(1) of the Act, 21 U.S.C.section 360bbb-3(b)(1), unless the authorization is terminated  or revoked sooner.       Influenza A by PCR NEGATIVE NEGATIVE Final   Influenza B by PCR NEGATIVE NEGATIVE Final    Comment: (NOTE) The Xpert Xpress SARS-CoV-2/FLU/RSV plus assay is intended as an aid in the diagnosis of influenza from Nasopharyngeal swab specimens and should not be used as a sole basis for treatment. Nasal washings and aspirates are unacceptable for Xpert Xpress SARS-CoV-2/FLU/RSV testing.  Fact Sheet for Patients: EntrepreneurPulse.com.au  Fact Sheet for Healthcare  Providers: IncredibleEmployment.be  This test is not yet approved or cleared by the [place] FDA and has been authorized for detection and/or diagnosis of SARS-CoV-2 by FDA under an Emergency Use Authorization (EUA). This EUA will remain in effect (meaning this test can be used) for the duration of the COVID-19 declaration under Section 564(b)(1) of the Act, 21 U.S.C. section 360bbb-3(b)(1), unless the authorization is terminated or revoked.  Performed at [facility], [address] [place], [place] [address]      Time coordinating discharge: Over 30 minutes  SIGNED:   Wyvonnia Dusky, MD  [facility] 10/17/2021, 12:16 PM Pager   If 7PM-7AM, please contact night-coverage

## 2021-10-17 NOTE — Progress Notes (Signed)
Mobility Specialist - Progress Note   10/17/21 1258  Mobility  Activity Ambulated in room;Transferred:  Bed to chair  Level of Assistance Modified independent, requires aide device or extra time  Assistive Device Front wheel walker  Distance Ambulated (ft) 60 ft  Mobility Ambulated with assistance in room;Out of bed to chair with meals  Mobility Response Tolerated well  Mobility performed by Mobility specialist  $Mobility charge 1 Mobility    Pt ambulated in room modI. No complaints. Left in chair after session. Utilizing RA. Anticipating d/c later this date.    Kathee Delton Mobility Specialist 10/17/21, 12:59 PM

## 2021-10-17 NOTE — TOC Transition Note (Signed)
Transition of Care [facility]) - CM/SW Discharge Note   Patient Details  Name: Alison Richardson MRN: [id number] Date of Birth: 1933-05-16  [facility]) CM/SW Contact:  Alberteen Sam, LCSW Phone Number: 10/17/2021, 1:25 PM   Clinical Narrative:     Patient will DC to: [facility] Anticipated DC date: 10/17/21 Family notified: daughter to transport at 3pm  Per MD patient ready for DC to [facility]. RN, patient, patient's family, and facility notified of DC. Discharge Summary sent to facility. RN given number for report  [phone] Room 340. DC packet on chart.  CSW signing off.  Pricilla Riffle, LCSW    Final next level of care: Skilled Nursing Facility Barriers to Discharge: No Barriers Identified   Patient Goals and CMS Choice Patient states their goals for this hospitalization and ongoing recovery are:: to go home CMS Medicare.gov Compare Post Acute Care list provided to:: Patient Represenative (must comment) (daughter) Choice offered to / list presented to : Adult Children  Discharge Placement              Patient chooses bed at: [facility] Patient to be transferred to facility by: daughter Name of family member notified: daughter Patient and family notified of of transfer: 10/17/21  Discharge Plan and Services     Post Acute Care Choice: [place]                               Social Determinants of Health (SDOH) Interventions     Readmission Risk Interventions No flowsheet data found.

## 2021-10-17 NOTE — Progress Notes (Signed)
Inpatient Diabetes Program Recommendations  AACE/ADA: New Consensus Statement on Inpatient Glycemic Control (2015)  Target Ranges:  Prepandial:   less than 140 mg/dL      Peak postprandial:   less than 180 mg/dL (1-2 hours)      Critically ill patients:  140 - 180 mg/dL   Lab Results  Component Value Date   GLUCAP 226 (H) 10/17/2021   HGBA1C 7.7 (H) 10/16/2021    Review of Glycemic Control  Diabetes history: DM2 Outpatient Diabetes medications: Glipizide XL 2.5 mg daily Current orders for Inpatient glycemic control: Semglee 8 units qd, Novolog 3 units tid meal coverage, Novolog 0-20 units TID with meals; Decadron 3 mg Q8H  Inpatient Diabetes Program Recommendations:   While on current dose of Decadron, please consider: -Increase Novolog meal coverage to 4 units tid if eats 50% Secure  chat sent to Dr. Jimmye Norman.  Thank you, Nani Gasser. Twilla Khouri, RN, MSN, CDE  Diabetes Coordinator Inpatient Glycemic Control Team Team Pager [phone] (8am-5pm) 10/17/2021 9:58 AM

## 2021-10-17 NOTE — Progress Notes (Signed)
This RN provided report to receiving facility, [facility]. Report given to Spero Curb, Nurse assuming care of patient. All outstanding questions resolved. Copy of AVS given to patient's daughter with accompanying instructions. PIVs removed. Cannulas intact. Pt tolerated well. All belongings packed and in tow. Pt declined EMS transportation as patient's daughter will transfer her to the facility. Volunteer services to transport patient to private vehicle at time of departure.

## 2021-10-18 ENCOUNTER — Other Ambulatory Visit: Payer: Self-pay

## 2021-10-18 ENCOUNTER — Ambulatory Visit: Payer: Medicare PPO | Admitting: Podiatry

## 2021-10-18 ENCOUNTER — Emergency Department
Admission: EM | Admit: 2021-10-18 | Discharge: 2021-10-18 | Disposition: A | Discharge status: Skilled Nursing Facility | Payer: Medicare PPO | Attending: Emergency Medicine | Admitting: Emergency Medicine

## 2021-10-18 DIAGNOSIS — Z79899 Other long term (current) drug therapy: Secondary | ICD-10-CM | POA: Insufficient documentation

## 2021-10-18 DIAGNOSIS — I110 Hypertensive heart disease with heart failure: Secondary | ICD-10-CM | POA: Insufficient documentation

## 2021-10-18 DIAGNOSIS — I5032 Chronic diastolic (congestive) heart failure: Secondary | ICD-10-CM | POA: Insufficient documentation

## 2021-10-18 DIAGNOSIS — Z7901 Long term (current) use of anticoagulants: Secondary | ICD-10-CM | POA: Insufficient documentation

## 2021-10-18 DIAGNOSIS — Z96652 Presence of left artificial knee joint: Secondary | ICD-10-CM | POA: Insufficient documentation

## 2021-10-18 DIAGNOSIS — E860 Dehydration: Secondary | ICD-10-CM

## 2021-10-18 DIAGNOSIS — E039 Hypothyroidism, unspecified: Secondary | ICD-10-CM | POA: Diagnosis not present

## 2021-10-18 DIAGNOSIS — Z85828 Personal history of other malignant neoplasm of skin: Secondary | ICD-10-CM | POA: Diagnosis not present

## 2021-10-18 DIAGNOSIS — R197 Diarrhea, unspecified: Secondary | ICD-10-CM | POA: Insufficient documentation

## 2021-10-18 DIAGNOSIS — Z20822 Contact with and (suspected) exposure to covid-19: Secondary | ICD-10-CM | POA: Diagnosis not present

## 2021-10-18 DIAGNOSIS — R112 Nausea with vomiting, unspecified: Secondary | ICD-10-CM | POA: Insufficient documentation

## 2021-10-18 DIAGNOSIS — E1140 Type 2 diabetes mellitus with diabetic neuropathy, unspecified: Secondary | ICD-10-CM | POA: Insufficient documentation

## 2021-10-18 LAB — COMPREHENSIVE METABOLIC PANEL
ALT: 54 U/L — ABNORMAL HIGH (ref 0–44)
AST: 31 U/L (ref 15–41)
Albumin: 3.3 g/dL — ABNORMAL LOW (ref 3.5–5.0)
Alkaline Phosphatase: 84 U/L (ref 38–126)
Anion gap: 8 (ref 5–15)
BUN: 31 mg/dL — ABNORMAL HIGH (ref 8–23)
CO2: 24 mmol/L (ref 22–32)
Calcium: 8.6 mg/dL — ABNORMAL LOW (ref 8.9–10.3)
Chloride: 104 mmol/L (ref 98–111)
Creatinine, Ser: 1.06 mg/dL — ABNORMAL HIGH (ref 0.44–1.00)
GFR, Estimated: 51 mL/min — ABNORMAL LOW (ref >60)
Glucose, Bld: 230 mg/dL — ABNORMAL HIGH (ref 70–99)
Potassium: 3.6 mmol/L (ref 3.5–5.1)
Sodium: 136 mmol/L (ref 135–145)
Total Bilirubin: 0.6 mg/dL (ref 0.3–1.2)
Total Protein: 6.8 g/dL (ref 6.5–8.1)

## 2021-10-18 LAB — CBC
HCT: 42.7 % (ref 36.0–46.0)
Hemoglobin: 15.1 g/dL — ABNORMAL HIGH (ref 12.0–15.0)
MCH: 31.9 pg (ref 26.0–34.0)
MCHC: 35.4 g/dL (ref 30.0–36.0)
MCV: 90.3 fL (ref 80.0–100.0)
Platelets: 399 10*3/uL (ref 150–400)
RBC: 4.73 MIL/uL (ref 3.87–5.11)
RDW: 13.7 % (ref 11.5–15.5)
WBC: 19.2 10*3/uL — ABNORMAL HIGH (ref 4.0–10.5)
nRBC: 0 % (ref 0.0–0.2)

## 2021-10-18 LAB — URINALYSIS, ROUTINE W REFLEX MICROSCOPIC
Bilirubin Urine: NEGATIVE
Glucose, UA: NEGATIVE mg/dL
Ketones, ur: NEGATIVE mg/dL
Leukocytes,Ua: NEGATIVE
Nitrite: NEGATIVE
Protein, ur: NEGATIVE mg/dL
Specific Gravity, Urine: 1.012 (ref 1.005–1.030)
Squamous Epithelial / HPF: NONE SEEN (ref 0–5)
pH: 5 (ref 5.0–8.0)

## 2021-10-18 LAB — RESP PANEL BY RT-PCR (FLU A&B, COVID) ARPGX2
Influenza A by PCR: NEGATIVE
Influenza B by PCR: NEGATIVE
SARS Coronavirus 2 by RT PCR: NEGATIVE

## 2021-10-18 LAB — LIPASE, BLOOD: Lipase: 41 U/L (ref 11–51)

## 2021-10-18 LAB — LACTIC ACID, PLASMA
Lactic Acid, Venous: 2.4 mmol/L (ref 0.5–1.9)
Lactic Acid, Venous: 1.6 mmol/L (ref 0.5–1.9)

## 2021-10-18 LAB — MAGNESIUM: Magnesium: 2 mg/dL (ref 1.7–2.4)

## 2021-10-18 MED ORDER — SODIUM CHLORIDE 0.9 % IV BOLUS
500.0000 mL | Freq: Once | INTRAVENOUS | Status: AC
  Administered 2021-10-18: 500 mL via INTRAVENOUS

## 2021-10-18 NOTE — ED Notes (Signed)
Dr. Stafford at bedside.  

## 2021-10-18 NOTE — ED Notes (Signed)
Provided water and crackers for PO challenge.

## 2021-10-18 NOTE — ED Notes (Signed)
Pts daughter to ED to pick up pt and take back to [facility]. Report previously provided to facility by day shift. IVS removed, pt cleaned and new brief and dry pants in place. Discharge instructions provided to pt and daughter. Pt assisted into wheelchair and out to car for transport.

## 2021-10-18 NOTE — ED Notes (Signed)
ED provider at bedside.

## 2021-10-18 NOTE — ED Provider Notes (Signed)
[facility] Emergency Department Provider Note  ____________________________________________  Time seen: Approximately 1:23 PM  I have reviewed the triage vital signs and the nursing notes.   HISTORY  Chief Complaint Diarrhea    HPI Alison Richardson is a 87 y.o. female with a history of atrial flutter, diabetes, CHF, hypertension who comes ED complaining of nausea and clear watery diarrhea that started yesterday morning while the patient was in the hospital.  She was being treated for a non-STEMI, was cleared for discharge and went home.  However, symptoms have persisted throughout yesterday and this morning.  Its associated with mild intermittent abdominal cramping.  Denies any specific pain.  No vomiting.  No fever or chills.  No aggravating or alleviating factors.  Patient feels very fatigued and gets dizzy with standing.  No falls or syncope or trauma.  Patient denies any black or bloody stool.  Diarrhea is described as clear watery.  Past Medical History:  Diagnosis Date   Arrhythmia    Atrial flutter ([place]) 01/2018   new onset    Basal cell carcinoma of back    Basal cell carcinoma of lip    Cerebral aneurysm    followed by [facility]   CHF (congestive heart failure) (HCC)    DDD (degenerative disc disease), lumbar    superior plate depression, L3 08/18/2014   Diabetes mellitus type II, controlled ([place])    Diverticulosis    Dysrhythmia    Paroxysmal Supraventricular Tachycardia   Dysthymia    depression   GERD (gastroesophageal reflux disease)    History of meniscal tear    Hyperlipidemia    Hypertension    Hypothyroidism    Late onset Alzheimer's disease with behavioral disturbance ([place])    Mild pulmonary hypertension ([place])    Overactive bladder    Peripheral vascular disease ([place])    Seasonal allergic rhinitis    Sleep apnea      Patient Active Problem List   Diagnosis Date Noted   NSTEMI (non-ST elevated myocardial infarction) ([place])  10/10/2021   Chronic diastolic CHF (congestive heart failure) ([place]) 10/13/2020   Acute CHF (congestive heart failure) ([place]) 10/07/2020   Elevated troponin 10/07/2020   CHF (congestive heart failure) ([place]) 10/07/2020   Leukocytosis 10/07/2020   DOE (dyspnea on exertion) 05/09/2020   Syncope and collapse 09/30/2019   Syncope 09/29/2019   Cor pulmonale, chronic ([place]) 09/27/2019   Late onset Alzheimer's disease without behavioral disturbance ([place]) 09/23/2019   Obstructive sleep apnea 07/09/2019   Diabetes mellitus type 2 in nonobese ([place]) 01/21/2019   Dyslipidemia 01/21/2019   PSVT (paroxysmal supraventricular tachycardia) ([place]) 01/21/2019   Tenosynovitis of foot 01/21/2019   PAD (peripheral artery disease) ([place]) 01/06/2019   Preop testing 01/06/2019   DM type 2 with diabetic mixed hyperlipidemia ([place]) 09/08/2018   Peripheral neuropathy, idiopathic 09/08/2018   Anxiety disorder 06/19/2018   Depression, major, single episode, mild ([place]) 06/19/2018   Paroxysmal atrial fibrillation with rapid ventricular response ([place]) 06/19/2018   Atrial flutter ([place]) 06/19/2018   Pure hypercholesterolemia 06/19/2018   Vitamin B12 deficiency 06/19/2018   Insomnia 06/19/2018   Protein-calorie malnutrition ([place]) 06/19/2018   Type 2 diabetes mellitus with diabetic neuropathy ([place]) 06/19/2018   Knee pain 06/19/2018   Cognitive impairment, mild, so stated 06/19/2018   Osteoarthritis of right knee 06/19/2018   Benign essential hypertension 06/19/2018   Hypothyroidism 06/19/2018   Overactive bladder 06/19/2018   Generalized muscle weakness 06/19/2018   Difficulty in walking, not elsewhere classified 06/19/2018  Major depression in remission ([place]) 06/18/2018   Primary osteoarthritis involving multiple joints 06/18/2018   Mixed hyperlipidemia 11/01/2015   Cerebral aneurysm 04/29/2013     Past Surgical History:  Procedure Laterality Date   APPENDECTOMY  1946   BASAL CELL CARCINOMA EXCISION     2006  and 2009 removed from back and lip   CARDIOVASCULAR STRESS TEST  2015   nuclear cardiac stress test negative for ischemia - Dr. Satira Sark   CATARACT EXTRACTION, BILATERAL  2006   COLONOSCOPY  2014   COLONOSCOPY N/A 08/19/2020   Procedure: COLONOSCOPY;  Surgeon: Lesly Rubenstein, MD;  Location: [facility] ENDOSCOPY;  Service: Endoscopy;  Laterality: N/A;   ECTOPIC PREGNANCY SURGERY  1957   ESOPHAGOGASTRODUODENOSCOPY N/A 08/19/2020   Procedure: ESOPHAGOGASTRODUODENOSCOPY (EGD);  Surgeon: Lesly Rubenstein, MD;  Location: [facility] ENDOSCOPY;  Service: Endoscopy;  Laterality: N/A;   INJECTION KNEE Left 05/2015   LEFT HEART CATH AND CORONARY ANGIOGRAPHY N/A 10/12/2021   Procedure: LEFT HEART CATH AND CORONARY ANGIOGRAPHY;  Surgeon: Corey Skains, MD;  Location: [facility] CV LAB;  Service: Cardiovascular;  Laterality: N/A;   REPLACEMENT TOTAL KNEE Left 09/06/2016   Dr. Barnet Pall   TONSILLECTOMY AND ADENOIDECTOMY  1953   TOTAL ABDOMINAL HYSTERECTOMY  1986   DU B     Prior to Admission medications   Medication Sig Start Date End Date Taking? Authorizing Provider  amiodarone (PACERONE) 200 MG tablet Take 200 mg by mouth daily.  01/02/19   [provider]  cetirizine (ZYRTEC) 10 MG tablet Take 10 mg by mouth daily.    [provider]  ciclopirox (PENLAC) 8 % solution Apply topically at bedtime. Apply to nail/surrounding skin and daily over previous coat. Every 7 days remove with alcohol and continue. Patient not taking: Reported on 10/10/2021 09/18/21   Hyatt, Max T, DPM  clindamycin (CLEOCIN) 300 MG capsule Take 1 capsule (300 mg total) by mouth 3 (three) times daily for 8 days. 10/17/21 10/25/21  Wyvonnia Dusky, MD  clopidogrel (PLAVIX) 75 MG tablet Take 1 tablet (75 mg total) by mouth daily. 10/18/21 11/17/21  Wyvonnia Dusky, MD  donepezil (ARICEPT) 10 MG tablet Take 10 mg by mouth at bedtime.    [provider]  ELIQUIS 5 MG TABS tablet Take 5 mg by mouth 2 (two)  times daily.  05/06/20   [provider]  furosemide (LASIX) 20 MG tablet Take 1 tablet (20 mg total) by mouth daily. 10/07/20   Enzo Bi, MD  gabapentin (NEURONTIN) 100 MG capsule Take 100 mg by mouth at bedtime as needed. 05/06/20   [provider]  gentamicin ointment (GARAMYCIN) 0.1 % Apply topically 4 (four) times daily. 10/17/21   Wyvonnia Dusky, MD  glipiZIDE (GLUCOTROL XL) 2.5 MG 24 hr tablet Take 2.5 mg by mouth daily. Patient not taking: Reported on 10/10/2021 09/19/21   [provider]  isosorbide mononitrate (IMDUR) 30 MG 24 hr tablet Take 1 tablet (30 mg total) by mouth daily. 10/18/21 11/17/21  Wyvonnia Dusky, MD  levothyroxine (SYNTHROID) 88 MCG tablet Take 88 mcg by mouth daily.  09/21/19   [provider]  lisinopril (ZESTRIL) 5 MG tablet Take 1 tablet (5 mg total) by mouth daily. 10/18/21 11/17/21  Wyvonnia Dusky, MD  metoprolol tartrate (LOPRESSOR) 100 MG tablet Take 1 tablet (100 mg total) by mouth 2 (two) times daily. 10/17/21 11/16/21  Wyvonnia Dusky, MD  potassium chloride (MICRO-K) 10 MEQ CR capsule Take 10 mEq by  mouth 2 (two) times daily.     [provider]  pravastatin (PRAVACHOL) 20 MG tablet Take 20 mg by mouth at bedtime. 07/05/20   [provider]  predniSONE (DELTASONE) 10 MG tablet 40 mg daily x 2 days, 30 mg daily x 2 days, 20 mg daily x 2 days, 10 mg daily x 2 days then stop 10/17/21   Wyvonnia Dusky, MD  venlafaxine XR (EFFEXOR-XR) 75 MG 24 hr capsule Take 75 mg by mouth daily with breakfast.     [provider]     Allergies Pantoprazole and Metformin and related   Family History  Problem Relation Age of Onset   Hypertension Mother    CAD Father    Diabetes Sister    Diabetes Paternal Uncle     Social History Social History   Tobacco Use   Smoking status: Never   Smokeless tobacco: Never  Vaping Use   Vaping Use: Never used  Substance Use Topics   Alcohol  use: Yes    Alcohol/week: 3.0 standard drinks    Types: 3 Shots of liquor per week    Comment: 3 x week    Drug use: Never    Review of Systems  Constitutional:   No fever or chills.  ENT:   No sore throat. No rhinorrhea. Cardiovascular:   No chest pain or syncope. Respiratory:   No dyspnea or cough. Gastrointestinal:   Negative for abdominal pain, positive watery diarrhea.  Musculoskeletal:   Negative for focal pain or swelling All other systems reviewed and are negative except as documented above in ROS and HPI.  ____________________________________________   PHYSICAL EXAM:  VITAL SIGNS: ED Triage Vitals [10/18/21 1056]  Enc Vitals Group     BP (!) 87/58     Pulse Rate 88     Resp 16     Temp (!) 97.5 F (36.4 C)     Temp Source Oral     SpO2 96 %     Weight 179 lb (81.2 kg)     Height 5\' 6"  (1.676 m)     Head Circumference      Peak Flow      Pain Score 0     Pain Loc      Pain Edu?      Excl. in [place]?     Vital signs reviewed, nursing assessments reviewed.   Constitutional:   Alert and oriented. Non-toxic appearance. Eyes:   Conjunctivae are normal. EOMI. PERRL. ENT      Head:   Normocephalic and atraumatic.      Nose:   Normal.      Mouth/Throat: Dry mucous membranes.      Neck:   No meningismus. Full ROM. Hematological/Lymphatic/Immunilogical:   No cervical lymphadenopathy. Cardiovascular:   RRR. Symmetric bilateral radial and DP pulses.  No murmurs. Cap refill less than 2 seconds. Respiratory:   Normal respiratory effort without tachypnea/retractions. Breath sounds are clear and equal bilaterally. No wheezes/rales/rhonchi. Gastrointestinal:   Soft and nontender. Non distended. There is no CVA tenderness.  No rebound, rigidity, or guarding. Genitourinary:   deferred Musculoskeletal:   Normal range of motion in all extremities. No joint effusions.  No lower extremity tenderness.  No edema. Neurologic:   Normal speech and language.  Motor grossly  intact. No acute focal neurologic deficits are appreciated.  Skin:    Skin is warm, dry and intact. No rash noted.  No petechiae, purpura, or bullae.  ____________________________________________  LABS (pertinent positives/negatives) (all labs ordered are listed, but only abnormal results are displayed) Labs Reviewed  COMPREHENSIVE METABOLIC PANEL - Abnormal; Notable for the following components:      Result Value   Glucose, Bld 230 (*)    BUN 31 (*)    Creatinine, Ser 1.06 (*)    Calcium 8.6 (*)    Albumin 3.3 (*)    ALT 54 (*)    GFR, Estimated 51 (*)    All other components within normal limits  CBC - Abnormal; Notable for the following components:   WBC 19.2 (*)    Hemoglobin 15.1 (*)    All other components within normal limits  LACTIC ACID, PLASMA - Abnormal; Notable for the following components:   Lactic Acid, Venous 2.4 (*)    All other components within normal limits  RESP PANEL BY RT-PCR (FLU A&B, COVID) ARPGX2  C DIFFICILE QUICK SCREEN W PCR REFLEX    LIPASE, BLOOD  URINALYSIS, ROUTINE W REFLEX MICROSCOPIC  LACTIC ACID, PLASMA  MAGNESIUM   ____________________________________________   EKG  Interpreted by me Atrial fibrillation rate of 92.  Left axis, normal intervals.  Poor R wave progression.  Normal ST segments and T waves, no acute ischemic changes.  ____________________________________________    RADIOLOGY  No results found.  ____________________________________________   PROCEDURES Procedures  ____________________________________________  DIFFERENTIAL DIAGNOSIS   Dehydration, viral illness, C. difficile colitis, electrolyte abnormality  CLINICAL IMPRESSION / ASSESSMENT AND PLAN / ED COURSE  Medications ordered in the ED: Medications  sodium chloride 0.9 % bolus 500 mL (0 mLs Intravenous Stopped 10/18/21 1247)  sodium chloride 0.9 % bolus 500 mL (500 mLs Intravenous New Bag/Given 10/18/21 1255)    Pertinent labs & imaging results  that were available during my care of the patient were reviewed by me and considered in my medical decision making (see chart for details).  Alison Richardson was evaluated in Emergency Department on 10/18/2021 for the symptoms described in the history of present illness. She was evaluated in the context of the global COVID-19 pandemic, which necessitated consideration that the patient might be at risk for infection with the SARS-CoV-2 virus that causes COVID-19. Institutional protocols and algorithms that pertain to the evaluation of patients at risk for COVID-19 are in a state of rapid change based on information released by regulatory bodies including the CDC and federal and state organizations. These policies and algorithms were followed during the patient's care in the ED.   Patient presents with fatigue and dizziness in the setting of watery diarrhea, appears dehydrated which I think is the reason for borderline low blood pressure.  I do not think the patient is septic.  Labs consistent with dehydration with elevated BUN and lactate.  Awaiting C. difficile.  Hypotension has resolved with IV fluids, vital stable.  We will check orthostatics.      ____________________________________________   FINAL CLINICAL IMPRESSION(S) / ED DIAGNOSES    Final diagnoses:  Diarrhea of presumed infectious origin  Dehydration     ED Discharge Orders     None       Portions of this note were generated with dragon dictation software. Dictation errors may occur despite best attempts at proofreading.    Carrie Mew, MD 10/18/21 1329

## 2021-10-18 NOTE — ED Notes (Addendum)
Pt appears diaphoretic but denies CP and SOB. Pt was released from hospital yesterday after treatment for NSTEMI. Pt to ED because of diaphoresis and weakness. Pt had multiple episodes of diarrhea yesterday and was recently treated with abx for MRSA in nose. A fib on monitor. Lab at bedside. Second 22g IV inserted L forearm.

## 2021-10-18 NOTE — ED Notes (Signed)
Pt has not had any episodes of diarrhea since arriving to hospital. ED provider discussing possible discharge with pt.

## 2021-10-18 NOTE — Progress Notes (Signed)
  Patient from [facility], please contact Earley Abide w/ update on discharge [phone].

## 2021-10-18 NOTE — ED Notes (Signed)
Left voicemail at daughter [name]'s phone to inform re: discharge. She already knew discharge was likely based on previous conversations.

## 2021-10-18 NOTE — ED Notes (Signed)
Urine collected from suction canister for urinalysis. Urine yellow and clear. Per pt, had darker orange colored urine two days ago and was cloudy.

## 2021-10-18 NOTE — ED Notes (Signed)
Pt reported feeling better after IV fluid bolus. Pt reported feeling more alert and overall weakness improved.

## 2021-10-18 NOTE — ED Triage Notes (Signed)
[facility]o nausea and watery diarrhea since yesterday morning  denies any abd pain or emesis. Pt was just discharged from the hospital yesterday dx with NSTEMI.  The pt is hypotensive in triage with HR 80-110's

## 2021-10-18 NOTE — ED Notes (Signed)
EDP was just at bedside. 

## 2021-10-18 NOTE — ED Notes (Signed)
Talked with pt and daughter re: plan to get stool sample if possible before discharge. Daughter has to leave now. Discussed possibility of EMS transport if/when pt discharged. Pt verbalized that she feels better, does not feel weak.

## 2021-10-18 NOTE — ED Notes (Signed)
Critical lab result for Lactic acid of 2.4. EDP notified face to face.

## 2021-10-18 NOTE — ED Triage Notes (Signed)
Pt arrived to ed via ems from [facility]. Ems reports diarrhea x 1 day. Seen on 35 th for MI. Hx controled afib. Ems states pt not c/o any pain, nausea resolve last night, denies sob. Pt A&o x 4.   Bp 116/73 95% RA HR 80 232 cbg 96.1 oral temp

## 2021-10-18 NOTE — ED Provider Notes (Signed)
Patient received in sign-out from Dr. Joni Fears.  Workup and evaluation pending studies and reassessment.  Nurse brought to my attention patient is requesting discharge.  She is been here 7 and half hours has not had any diarrheal illness.  She states that she feels well she is tolerating p.o.  She is not orthostatic.  She does not have any abdominal pain.  Her exam is otherwise reassuring.  Given reassuring observation, I do believe she stable and appropriate for discharge.  We discussed strict return precautions.  Patient agreeable to plan.Merlyn Lot, MD 10/18/21 [phone]

## 2021-10-18 NOTE — ED Notes (Signed)
Paper discharge consent signed by pt. 

## 2021-10-19 LAB — AEROBIC/ANAEROBIC CULTURE W GRAM STAIN (SURGICAL/DEEP WOUND): Gram Stain: NONE SEEN

## 2021-10-31 ENCOUNTER — Emergency Department: Payer: Medicare PPO

## 2021-10-31 ENCOUNTER — Inpatient Hospital Stay
Admit: 2021-10-31 | Discharge: 2021-10-31 | Disposition: A | Discharge status: Home / Self Care | Payer: Medicare PPO | Attending: Internal Medicine | Admitting: Internal Medicine

## 2021-10-31 ENCOUNTER — Inpatient Hospital Stay
Admission: EM | Admit: 2021-10-31 | Discharge: 2021-11-02 | DRG: 291 | Disposition: A | Discharge status: Skilled Nursing Facility | Payer: Medicare PPO | Source: Skilled Nursing Facility | Attending: Student in an Organized Health Care Education/Training Program | Admitting: Student in an Organized Health Care Education/Training Program

## 2021-10-31 ENCOUNTER — Encounter: Payer: Self-pay | Admitting: Emergency Medicine

## 2021-10-31 ENCOUNTER — Other Ambulatory Visit: Payer: Self-pay

## 2021-10-31 DIAGNOSIS — I5033 Acute on chronic diastolic (congestive) heart failure: Secondary | ICD-10-CM | POA: Diagnosis not present

## 2021-10-31 DIAGNOSIS — I509 Heart failure, unspecified: Secondary | ICD-10-CM

## 2021-10-31 DIAGNOSIS — E1159 Type 2 diabetes mellitus with other circulatory complications: Secondary | ICD-10-CM | POA: Diagnosis present

## 2021-10-31 DIAGNOSIS — E1151 Type 2 diabetes mellitus with diabetic peripheral angiopathy without gangrene: Secondary | ICD-10-CM | POA: Diagnosis present

## 2021-10-31 DIAGNOSIS — J9601 Acute respiratory failure with hypoxia: Secondary | ICD-10-CM | POA: Diagnosis present

## 2021-10-31 DIAGNOSIS — F418 Other specified anxiety disorders: Secondary | ICD-10-CM | POA: Diagnosis present

## 2021-10-31 DIAGNOSIS — Z8249 Family history of ischemic heart disease and other diseases of the circulatory system: Secondary | ICD-10-CM | POA: Diagnosis not present

## 2021-10-31 DIAGNOSIS — E876 Hypokalemia: Secondary | ICD-10-CM | POA: Diagnosis not present

## 2021-10-31 DIAGNOSIS — I110 Hypertensive heart disease with heart failure: Principal | ICD-10-CM | POA: Diagnosis present

## 2021-10-31 DIAGNOSIS — E039 Hypothyroidism, unspecified: Secondary | ICD-10-CM | POA: Diagnosis present

## 2021-10-31 DIAGNOSIS — I152 Hypertension secondary to endocrine disorders: Secondary | ICD-10-CM | POA: Diagnosis present

## 2021-10-31 DIAGNOSIS — Z888 Allergy status to other drugs, medicaments and biological substances status: Secondary | ICD-10-CM

## 2021-10-31 DIAGNOSIS — I5043 Acute on chronic combined systolic (congestive) and diastolic (congestive) heart failure: Secondary | ICD-10-CM | POA: Diagnosis present

## 2021-10-31 DIAGNOSIS — Z794 Long term (current) use of insulin: Secondary | ICD-10-CM

## 2021-10-31 DIAGNOSIS — I2510 Atherosclerotic heart disease of native coronary artery without angina pectoris: Secondary | ICD-10-CM | POA: Diagnosis present

## 2021-10-31 DIAGNOSIS — I252 Old myocardial infarction: Secondary | ICD-10-CM | POA: Diagnosis not present

## 2021-10-31 DIAGNOSIS — J302 Other seasonal allergic rhinitis: Secondary | ICD-10-CM | POA: Diagnosis present

## 2021-10-31 DIAGNOSIS — Z833 Family history of diabetes mellitus: Secondary | ICD-10-CM

## 2021-10-31 DIAGNOSIS — D72829 Elevated white blood cell count, unspecified: Secondary | ICD-10-CM | POA: Diagnosis present

## 2021-10-31 DIAGNOSIS — F0280 Dementia in other diseases classified elsewhere without behavioral disturbance: Secondary | ICD-10-CM | POA: Diagnosis present

## 2021-10-31 DIAGNOSIS — Z20822 Contact with and (suspected) exposure to covid-19: Secondary | ICD-10-CM | POA: Diagnosis present

## 2021-10-31 DIAGNOSIS — I10 Essential (primary) hypertension: Secondary | ICD-10-CM | POA: Diagnosis not present

## 2021-10-31 DIAGNOSIS — E1140 Type 2 diabetes mellitus with diabetic neuropathy, unspecified: Secondary | ICD-10-CM | POA: Diagnosis present

## 2021-10-31 DIAGNOSIS — E785 Hyperlipidemia, unspecified: Secondary | ICD-10-CM | POA: Diagnosis present

## 2021-10-31 DIAGNOSIS — G301 Alzheimer's disease with late onset: Secondary | ICD-10-CM | POA: Diagnosis present

## 2021-10-31 DIAGNOSIS — Z79890 Hormone replacement therapy (postmenopausal): Secondary | ICD-10-CM

## 2021-10-31 DIAGNOSIS — E7849 Other hyperlipidemia: Secondary | ICD-10-CM | POA: Diagnosis not present

## 2021-10-31 DIAGNOSIS — N3281 Overactive bladder: Secondary | ICD-10-CM | POA: Diagnosis present

## 2021-10-31 DIAGNOSIS — I671 Cerebral aneurysm, nonruptured: Secondary | ICD-10-CM | POA: Diagnosis present

## 2021-10-31 DIAGNOSIS — Z66 Do not resuscitate: Secondary | ICD-10-CM | POA: Diagnosis present

## 2021-10-31 DIAGNOSIS — I272 Pulmonary hypertension, unspecified: Secondary | ICD-10-CM | POA: Diagnosis present

## 2021-10-31 DIAGNOSIS — I5023 Acute on chronic systolic (congestive) heart failure: Secondary | ICD-10-CM | POA: Diagnosis not present

## 2021-10-31 DIAGNOSIS — Z7901 Long term (current) use of anticoagulants: Secondary | ICD-10-CM

## 2021-10-31 DIAGNOSIS — Z85828 Personal history of other malignant neoplasm of skin: Secondary | ICD-10-CM

## 2021-10-31 DIAGNOSIS — Z9841 Cataract extraction status, right eye: Secondary | ICD-10-CM

## 2021-10-31 DIAGNOSIS — Z7902 Long term (current) use of antithrombotics/antiplatelets: Secondary | ICD-10-CM

## 2021-10-31 DIAGNOSIS — F341 Dysthymic disorder: Secondary | ICD-10-CM | POA: Diagnosis present

## 2021-10-31 DIAGNOSIS — Z79899 Other long term (current) drug therapy: Secondary | ICD-10-CM

## 2021-10-31 DIAGNOSIS — I480 Paroxysmal atrial fibrillation: Secondary | ICD-10-CM | POA: Diagnosis present

## 2021-10-31 DIAGNOSIS — Z7984 Long term (current) use of oral hypoglycemic drugs: Secondary | ICD-10-CM

## 2021-10-31 DIAGNOSIS — K219 Gastro-esophageal reflux disease without esophagitis: Secondary | ICD-10-CM | POA: Diagnosis present

## 2021-10-31 DIAGNOSIS — Z9842 Cataract extraction status, left eye: Secondary | ICD-10-CM

## 2021-10-31 DIAGNOSIS — G4733 Obstructive sleep apnea (adult) (pediatric): Secondary | ICD-10-CM | POA: Diagnosis present

## 2021-10-31 DIAGNOSIS — R079 Chest pain, unspecified: Secondary | ICD-10-CM

## 2021-10-31 DIAGNOSIS — R0602 Shortness of breath: Secondary | ICD-10-CM | POA: Diagnosis not present

## 2021-10-31 LAB — ECHOCARDIOGRAM COMPLETE
AR max vel: 2.66 cm2
AV Area VTI: 2.17 cm2
AV Area mean vel: 2.52 cm2
AV Mean grad: 3 mmHg
AV Peak grad: 4.8 mmHg
Ao pk vel: 1.1 m/s
Area-P 1/2: 3.03 cm2
Height: 66 in
MV VTI: 1.84 cm2
S' Lateral: 3 cm
Weight: 2704 oz

## 2021-10-31 LAB — BASIC METABOLIC PANEL
Anion gap: 9 (ref 5–15)
BUN: 11 mg/dL (ref 8–23)
CO2: 25 mmol/L (ref 22–32)
Calcium: 8.6 mg/dL — ABNORMAL LOW (ref 8.9–10.3)
Chloride: 103 mmol/L (ref 98–111)
Creatinine, Ser: 0.67 mg/dL (ref 0.44–1.00)
GFR, Estimated: >60 mL/min (ref >60)
Glucose, Bld: 186 mg/dL — ABNORMAL HIGH (ref 70–99)
Potassium: 4.3 mmol/L (ref 3.5–5.1)
Sodium: 137 mmol/L (ref 135–145)

## 2021-10-31 LAB — CBC WITH DIFFERENTIAL/PLATELET
Abs Immature Granulocytes: 0.1 10*3/uL — ABNORMAL HIGH (ref 0.00–0.07)
Basophils Absolute: 0.1 10*3/uL (ref 0.0–0.1)
Basophils Relative: 0 %
Eosinophils Absolute: 0.1 10*3/uL (ref 0.0–0.5)
Eosinophils Relative: 0 %
HCT: 38 % (ref 36.0–46.0)
Hemoglobin: 12.5 g/dL (ref 12.0–15.0)
Immature Granulocytes: 1 %
Lymphocytes Relative: 10 %
Lymphs Abs: 1.5 10*3/uL (ref 0.7–4.0)
MCH: 31 pg (ref 26.0–34.0)
MCHC: 32.9 g/dL (ref 30.0–36.0)
MCV: 94.3 fL (ref 80.0–100.0)
Monocytes Absolute: 1.2 10*3/uL — ABNORMAL HIGH (ref 0.1–1.0)
Monocytes Relative: 8 %
Neutro Abs: 12 10*3/uL — ABNORMAL HIGH (ref 1.7–7.7)
Neutrophils Relative %: 81 %
Platelets: 257 10*3/uL (ref 150–400)
RBC: 4.03 MIL/uL (ref 3.87–5.11)
RDW: 14.6 % (ref 11.5–15.5)
WBC: 14.9 10*3/uL — ABNORMAL HIGH (ref 4.0–10.5)
nRBC: 0 % (ref 0.0–0.2)

## 2021-10-31 LAB — BRAIN NATRIURETIC PEPTIDE: B Natriuretic Peptide: 314.3 pg/mL — ABNORMAL HIGH (ref 0.0–100.0)

## 2021-10-31 LAB — RESP PANEL BY RT-PCR (FLU A&B, COVID) ARPGX2
Influenza A by PCR: NEGATIVE
Influenza B by PCR: NEGATIVE
SARS Coronavirus 2 by RT PCR: NEGATIVE

## 2021-10-31 LAB — CBG MONITORING, ED
Glucose-Capillary: 126 mg/dL — ABNORMAL HIGH (ref 70–99)
Glucose-Capillary: 167 mg/dL — ABNORMAL HIGH (ref 70–99)

## 2021-10-31 LAB — TROPONIN I (HIGH SENSITIVITY): Troponin I (High Sensitivity): 13 ng/L (ref <18)

## 2021-10-31 LAB — GLUCOSE, CAPILLARY: Glucose-Capillary: 84 mg/dL (ref 70–99)

## 2021-10-31 MED ORDER — LEVOTHYROXINE SODIUM 88 MCG PO TABS
88.0000 ug | ORAL_TABLET | Freq: Every day | ORAL | Status: DC
Start: 2021-11-01 — End: 2021-11-02
  Administered 2021-11-01 – 2021-11-02 (×2): 88 ug via ORAL
  Filled 2021-10-31 (×4): qty 1

## 2021-10-31 MED ORDER — FUROSEMIDE 10 MG/ML IJ SOLN
40.0000 mg | Freq: Once | INTRAMUSCULAR | Status: AC
  Administered 2021-10-31: 40 mg via INTRAVENOUS
  Filled 2021-10-31: qty 4

## 2021-10-31 MED ORDER — GENTAMICIN SULFATE 0.1 % EX OINT
TOPICAL_OINTMENT | Freq: Four times a day (QID) | CUTANEOUS | Status: DC
  Administered 2021-10-31: 2 via TOPICAL
  Administered 2021-10-31: 1 via TOPICAL
  Filled 2021-10-31: qty 15

## 2021-10-31 MED ORDER — LISINOPRIL 5 MG PO TABS
5.0000 mg | ORAL_TABLET | Freq: Every day | ORAL | Status: DC
  Administered 2021-11-01 – 2021-11-02 (×2): 5 mg via ORAL
  Filled 2021-10-31 (×2): qty 1

## 2021-10-31 MED ORDER — BISACODYL 5 MG PO TBEC
5.0000 mg | DELAYED_RELEASE_TABLET | Freq: Every day | ORAL | Status: DC | PRN
  Administered 2021-11-01: 5 mg via ORAL
  Filled 2021-10-31: qty 1

## 2021-10-31 MED ORDER — ISOSORBIDE MONONITRATE ER 30 MG PO TB24
30.0000 mg | ORAL_TABLET | Freq: Every day | ORAL | Status: DC
  Administered 2021-11-01 – 2021-11-02 (×2): 30 mg via ORAL
  Filled 2021-10-31 (×2): qty 1

## 2021-10-31 MED ORDER — INSULIN ASPART 100 UNIT/ML IJ SOLN
0.0000 [IU] | Freq: Three times a day (TID) | INTRAMUSCULAR | Status: DC
  Administered 2021-10-31: 2 [IU] via SUBCUTANEOUS
  Administered 2021-10-31: 1 [IU] via SUBCUTANEOUS
  Administered 2021-11-01: 2 [IU] via SUBCUTANEOUS
  Administered 2021-11-01 (×2): 1 [IU] via SUBCUTANEOUS
  Administered 2021-11-02: 3 [IU] via SUBCUTANEOUS
  Administered 2021-11-02: 2 [IU] via SUBCUTANEOUS
  Filled 2021-10-31 (×7): qty 1

## 2021-10-31 MED ORDER — INSULIN ASPART 100 UNIT/ML IJ SOLN: 0.0000 [IU] | Freq: Every day | INTRAMUSCULAR | Status: DC

## 2021-10-31 MED ORDER — APIXABAN 5 MG PO TABS
5.0000 mg | ORAL_TABLET | Freq: Two times a day (BID) | ORAL | Status: DC
  Administered 2021-10-31 – 2021-11-02 (×4): 5 mg via ORAL
  Filled 2021-10-31 (×4): qty 1

## 2021-10-31 MED ORDER — PRAVASTATIN SODIUM 20 MG PO TABS
20.0000 mg | ORAL_TABLET | Freq: Every day | ORAL | Status: DC
  Administered 2021-10-31 – 2021-11-01 (×2): 20 mg via ORAL
  Filled 2021-10-31 (×2): qty 1

## 2021-10-31 MED ORDER — CLOPIDOGREL BISULFATE 75 MG PO TABS
75.0000 mg | ORAL_TABLET | Freq: Every day | ORAL | Status: DC
  Administered 2021-11-01 – 2021-11-02 (×2): 75 mg via ORAL
  Filled 2021-10-31 (×2): qty 1

## 2021-10-31 MED ORDER — ALBUTEROL SULFATE (2.5 MG/3ML) 0.083% IN NEBU: 3.0000 mL | INHALATION_SOLUTION | RESPIRATORY_TRACT | Status: DC | PRN

## 2021-10-31 MED ORDER — HYDRALAZINE HCL 20 MG/ML IJ SOLN: 5.0000 mg | INTRAMUSCULAR | Status: DC | PRN

## 2021-10-31 MED ORDER — FUROSEMIDE 10 MG/ML IJ SOLN
40.0000 mg | Freq: Two times a day (BID) | INTRAMUSCULAR | Status: DC
  Administered 2021-10-31 – 2021-11-01 (×3): 40 mg via INTRAVENOUS
  Filled 2021-10-31 (×3): qty 4

## 2021-10-31 MED ORDER — VENLAFAXINE HCL ER 75 MG PO CP24
75.0000 mg | ORAL_CAPSULE | Freq: Every day | ORAL | Status: DC
  Administered 2021-11-01 – 2021-11-02 (×2): 75 mg via ORAL
  Filled 2021-10-31 (×3): qty 1

## 2021-10-31 MED ORDER — DICLOFENAC SODIUM 1 % EX GEL
2.0000 g | Freq: Four times a day (QID) | CUTANEOUS | Status: DC | PRN
  Filled 2021-10-31: qty 100

## 2021-10-31 MED ORDER — METOPROLOL TARTRATE 50 MG PO TABS
100.0000 mg | ORAL_TABLET | Freq: Two times a day (BID) | ORAL | Status: DC
  Administered 2021-11-01: 100 mg via ORAL
  Filled 2021-10-31 (×3): qty 2

## 2021-10-31 MED ORDER — DONEPEZIL HCL 5 MG PO TABS
10.0000 mg | ORAL_TABLET | Freq: Every day | ORAL | Status: DC
  Administered 2021-10-31 – 2021-11-01 (×2): 10 mg via ORAL
  Filled 2021-10-31 (×4): qty 2

## 2021-10-31 MED ORDER — SODIUM CHLORIDE 0.9% FLUSH
3.0000 mL | Freq: Two times a day (BID) | INTRAVENOUS | Status: DC
  Administered 2021-10-31 – 2021-11-02 (×4): 3 mL via INTRAVENOUS

## 2021-10-31 MED ORDER — GABAPENTIN 100 MG PO CAPS
100.0000 mg | ORAL_CAPSULE | Freq: Two times a day (BID) | ORAL | Status: DC
  Administered 2021-10-31 – 2021-11-02 (×4): 100 mg via ORAL
  Filled 2021-10-31 (×4): qty 1

## 2021-10-31 MED ORDER — AMIODARONE HCL 200 MG PO TABS
200.0000 mg | ORAL_TABLET | Freq: Every day | ORAL | Status: DC
  Administered 2021-11-01 – 2021-11-02 (×2): 200 mg via ORAL
  Filled 2021-10-31 (×2): qty 1

## 2021-10-31 MED ORDER — PERFLUTREN LIPID MICROSPHERE
1.0000 mL | INTRAVENOUS | Status: AC | PRN
Start: 2021-10-31 — End: 2021-10-31
  Administered 2021-10-31: 2 mL via INTRAVENOUS
  Filled 2021-10-31: qty 10

## 2021-10-31 MED ORDER — ONDANSETRON HCL 4 MG/2ML IJ SOLN: 4.0000 mg | Freq: Three times a day (TID) | INTRAMUSCULAR | Status: DC | PRN

## 2021-10-31 MED ORDER — ACETAMINOPHEN 325 MG PO TABS: 650.0000 mg | ORAL_TABLET | Freq: Four times a day (QID) | ORAL | Status: DC | PRN

## 2021-10-31 MED ORDER — DM-GUAIFENESIN ER 30-600 MG PO TB12: 1.0000 | ORAL_TABLET | Freq: Two times a day (BID) | ORAL | Status: DC | PRN

## 2021-10-31 MED ORDER — LORATADINE 10 MG PO TABS
10.0000 mg | ORAL_TABLET | Freq: Every day | ORAL | Status: DC
  Administered 2021-11-01 – 2021-11-02 (×2): 10 mg via ORAL
  Filled 2021-10-31 (×2): qty 1

## 2021-10-31 NOTE — ED Notes (Signed)
Report received from Michael, RN

## 2021-10-31 NOTE — ED Provider Notes (Signed)
[facility] Emergency Department Provider Note   ____________________________________________   Event Date/Time   First MD Initiated Contact with Patient 10/31/21 [phone]     (approximate)  I have reviewed the triage vital signs and the nursing notes.   HISTORY  Chief Complaint Shortness of Breath    HPI Alison Richardson is a 87 y.o. female with past medical history of hypertension, diabetes, hypothyroidism, atrial fibrillation on Eliquis, and CAD who presents to the ED complaining of chest pain and shortness of breath.  Patient reports that she woke up this morning with a "funny feeling" along with some difficulty catching her breath and pressure in her chest.  EMS was called to her nursing facility and reports her initial O2 sat was 88% on room air.  She was placed on 4 L nasal cannula with improvement, states that her breathing is now feeling better and chest pain has resolved.  She states she was feeling fine when she went to bed last night, denies any fevers, cough, pain or swelling in her legs.  She has not had any abdominal pain, nausea, or vomiting.  She was recently admitted to the hospital for NSTEMI which was managed medically, currently taking Eliquis and Plavix.        Past Medical History:  Diagnosis Date   Arrhythmia    Atrial flutter ([place]) 01/2018   new onset    Basal cell carcinoma of back    Basal cell carcinoma of lip    Cerebral aneurysm    followed by [facility]   CHF (congestive heart failure) (HCC)    DDD (degenerative disc disease), lumbar    superior plate depression, L3 08/18/2014   Diabetes mellitus type II, controlled ([place])    Diverticulosis    Dysrhythmia    Paroxysmal Supraventricular Tachycardia   Dysthymia    depression   GERD (gastroesophageal reflux disease)    History of meniscal tear    Hyperlipidemia    Hypertension    Hypothyroidism    Late onset Alzheimer's disease with behavioral disturbance ([place])    Mild pulmonary  hypertension (HCC)    Overactive bladder    Peripheral vascular disease (HCC)    Seasonal allergic rhinitis    Sleep apnea     Patient Active Problem List   Diagnosis Date Noted   Acute on chronic diastolic CHF (congestive heart failure) ([place]) 10/31/2021   HLD (hyperlipidemia) 10/31/2021   PAF (paroxysmal atrial fibrillation) ([place]) 10/31/2021   CAD (coronary artery disease) 10/31/2021   NSTEMI (non-ST elevated myocardial infarction) ([place]) 10/10/2021   Chronic diastolic CHF (congestive heart failure) ([place]) 10/13/2020   Acute CHF (congestive heart failure) ([place]) 10/07/2020   Elevated troponin 10/07/2020   CHF (congestive heart failure) ([place]) 10/07/2020   Leukocytosis 10/07/2020   DOE (dyspnea on exertion) 05/09/2020   Syncope and collapse 09/30/2019   Syncope 09/29/2019   Cor pulmonale, chronic ([place]) 09/27/2019   Late onset Alzheimer's disease without behavioral disturbance ([place]) 09/23/2019   Obstructive sleep apnea 07/09/2019   Diabetes mellitus type 2 in nonobese ([place]) 01/21/2019   Dyslipidemia 01/21/2019   PSVT (paroxysmal supraventricular tachycardia) ([place]) 01/21/2019   Tenosynovitis of foot 01/21/2019   PAD (peripheral artery disease) ([place]) 01/06/2019   Preop testing 01/06/2019   DM type 2 with diabetic mixed hyperlipidemia ([place]) 09/08/2018   Peripheral neuropathy, idiopathic 09/08/2018   Anxiety disorder 06/19/2018   Depression, major, single episode, mild ([place]) 06/19/2018   Paroxysmal atrial fibrillation with rapid ventricular response ([place]) 06/19/2018  Atrial flutter ([place]) 06/19/2018   Pure hypercholesterolemia 06/19/2018   Vitamin B12 deficiency 06/19/2018   Insomnia 06/19/2018   Protein-calorie malnutrition ([place]) 06/19/2018   Type 2 diabetes mellitus with diabetic neuropathy ([place]) 06/19/2018   Knee pain 06/19/2018   Cognitive impairment, mild, so stated 06/19/2018   Osteoarthritis of right knee 06/19/2018   Benign essential hypertension 06/19/2018    Hypothyroidism 06/19/2018   Overactive bladder 06/19/2018   Generalized muscle weakness 06/19/2018   Difficulty in walking, not elsewhere classified 06/19/2018   Major depression in remission ([place]) 06/18/2018   Primary osteoarthritis involving multiple joints 06/18/2018   Mixed hyperlipidemia 11/01/2015   Cerebral aneurysm 04/29/2013    Past Surgical History:  Procedure Laterality Date   APPENDECTOMY  1946   BASAL CELL CARCINOMA EXCISION     2006 and 2009 removed from back and lip   CARDIOVASCULAR STRESS TEST  2015   nuclear cardiac stress test negative for ischemia - Dr. Satira Sark   CATARACT EXTRACTION, BILATERAL  2006   COLONOSCOPY  2014   COLONOSCOPY N/A 08/19/2020   Procedure: COLONOSCOPY;  Surgeon: Lesly Rubenstein, MD;  Location: [facility] ENDOSCOPY;  Service: Endoscopy;  Laterality: N/A;   ECTOPIC PREGNANCY SURGERY  1957   ESOPHAGOGASTRODUODENOSCOPY N/A 08/19/2020   Procedure: ESOPHAGOGASTRODUODENOSCOPY (EGD);  Surgeon: Lesly Rubenstein, MD;  Location: [facility] ENDOSCOPY;  Service: Endoscopy;  Laterality: N/A;   INJECTION KNEE Left 05/2015   LEFT HEART CATH AND CORONARY ANGIOGRAPHY N/A 10/12/2021   Procedure: LEFT HEART CATH AND CORONARY ANGIOGRAPHY;  Surgeon: Corey Skains, MD;  Location: [facility] CV LAB;  Service: Cardiovascular;  Laterality: N/A;   REPLACEMENT TOTAL KNEE Left 09/06/2016   Dr. Barnet Pall   TONSILLECTOMY AND ADENOIDECTOMY  1953   TOTAL ABDOMINAL HYSTERECTOMY  1986   DU B    Prior to Admission medications   Medication Sig Start Date End Date Taking? Authorizing Provider  acetaminophen (TYLENOL) 325 MG tablet Take 650 mg by mouth every 4 (four) hours as needed.   Yes [provider]  amiodarone (PACERONE) 200 MG tablet Take 200 mg by mouth daily.  01/02/19  Yes [provider]  bisacodyl (DULCOLAX) 5 MG EC tablet Take 5 mg by mouth daily as needed for moderate constipation.   Yes [provider]  cetirizine (ZYRTEC) 10 MG tablet  Take 10 mg by mouth daily.   Yes [provider]  clopidogrel (PLAVIX) 75 MG tablet Take 1 tablet (75 mg total) by mouth daily. 10/18/21 11/17/21 Yes Wyvonnia Dusky, MD  diclofenac Sodium (VOLTAREN) 1 % GEL Apply 2 g topically 4 (four) times daily.   Yes [provider]  donepezil (ARICEPT) 10 MG tablet Take 10 mg by mouth at bedtime.   Yes [provider]  ELIQUIS 5 MG TABS tablet Take 5 mg by mouth 2 (two) times daily.  05/06/20  Yes [provider]  furosemide (LASIX) 20 MG tablet Take 1 tablet (20 mg total) by mouth daily. 10/07/20  Yes Enzo Bi, MD  gabapentin (NEURONTIN) 100 MG capsule Take 100 mg by mouth 2 (two) times daily. 05/06/20  Yes [provider]  gentamicin ointment (GARAMYCIN) 0.1 % Apply topically 4 (four) times daily. 10/17/21  Yes Wyvonnia Dusky, MD  glipiZIDE (GLUCOTROL XL) 2.5 MG 24 hr tablet Take 2.5 mg by mouth daily. 09/19/21  Yes [provider]  insulin NPH Human (NOVOLIN N) 100 UNIT/ML injection Inject 3-10 Units into the skin 4 (four) times daily -  before meals and  at bedtime.   Yes [provider]  isosorbide mononitrate (IMDUR) 30 MG 24 hr tablet Take 1 tablet (30 mg total) by mouth daily. 10/18/21 11/17/21 Yes Wyvonnia Dusky, MD  levothyroxine (SYNTHROID) 88 MCG tablet Take 88 mcg by mouth daily.  09/21/19  Yes [provider]  lisinopril (ZESTRIL) 5 MG tablet Take 1 tablet (5 mg total) by mouth daily. 10/18/21 11/17/21 Yes Wyvonnia Dusky, MD  loperamide (IMODIUM) 2 MG capsule Take 4 mg by mouth as needed for diarrhea or loose stools.   Yes [provider]  metoprolol tartrate (LOPRESSOR) 100 MG tablet Take 1 tablet (100 mg total) by mouth 2 (two) times daily. 10/17/21 11/16/21 Yes Wyvonnia Dusky, MD  potassium chloride (MICRO-K) 10 MEQ CR capsule Take 10 mEq by mouth 2 (two) times daily.    Yes [provider]  pravastatin (PRAVACHOL) 20 MG tablet Take 20  mg by mouth at bedtime. 07/05/20  Yes [provider]  venlafaxine XR (EFFEXOR-XR) 75 MG 24 hr capsule Take 75 mg by mouth daily with breakfast.    Yes [provider]  ciclopirox (PENLAC) 8 % solution Apply topically at bedtime. Apply to nail/surrounding skin and daily over previous coat. Every 7 days remove with alcohol and continue. Patient not taking: Reported on 10/31/2021 09/18/21   Tyson Dense T, DPM  predniSONE (DELTASONE) 10 MG tablet 40 mg daily x 2 days, 30 mg daily x 2 days, 20 mg daily x 2 days, 10 mg daily x 2 days then stop Patient not taking: Reported on 10/31/2021 10/17/21   Wyvonnia Dusky, MD    Allergies Pantoprazole and Metformin and related  Family History  Problem Relation Age of Onset   Hypertension Mother    CAD Father    Diabetes Sister    Diabetes Paternal Uncle     Social History Social History   Tobacco Use   Smoking status: Never   Smokeless tobacco: Never  Vaping Use   Vaping Use: Never used  Substance Use Topics   Alcohol use: Yes    Alcohol/week: 3.0 standard drinks    Types: 3 Shots of liquor per week    Comment: 3 x week    Drug use: Never    Review of Systems  Constitutional: No fever/chills Eyes: No visual changes. ENT: No sore throat. Cardiovascular: Positive for chest pain. Respiratory: Positive for shortness of breath. Gastrointestinal: No abdominal pain.  No nausea, no vomiting.  No diarrhea.  No constipation. Genitourinary: Negative for dysuria. Musculoskeletal: Negative for back pain. Skin: Negative for rash. Neurological: Negative for headaches, focal weakness or numbness.  ____________________________________________   PHYSICAL EXAM:  VITAL SIGNS: ED Triage Vitals  Enc Vitals Group     BP      Pulse      Resp      Temp      Temp src      SpO2      Weight      Height      Head Circumference      Peak Flow      Pain Score      Pain Loc      Pain Edu?      Excl. in [place]?      Constitutional: Alert and oriented. Eyes: Conjunctivae are normal. Head: Atraumatic. Nose: No congestion/rhinnorhea. Mouth/Throat: Mucous membranes are moist. Neck: Normal ROM Cardiovascular: Normal rate, regular rhythm. Grossly normal heart sounds.  2+ radial pulses bilaterally. Respiratory: Normal respiratory  effort.  No retractions. Lungs CTAB.  No chest wall tenderness to palpation. Gastrointestinal: Soft and nontender. No distention. Genitourinary: deferred Musculoskeletal: No lower extremity tenderness nor edema. Neurologic:  Normal speech and language. No gross focal neurologic deficits are appreciated. Skin:  Skin is warm, dry and intact. No rash noted. Psychiatric: Mood and affect are normal. Speech and behavior are normal.  ____________________________________________   LABS (all labs ordered are listed, but only abnormal results are displayed)  Labs Reviewed  CBC WITH DIFFERENTIAL/PLATELET - Abnormal; Notable for the following components:      Result Value   WBC 14.9 (*)    Neutro Abs 12.0 (*)    Monocytes Absolute 1.2 (*)    Abs Immature Granulocytes 0.10 (*)    All other components within normal limits  BASIC METABOLIC PANEL - Abnormal; Notable for the following components:   Glucose, Bld 186 (*)    Calcium 8.6 (*)    All other components within normal limits  BRAIN NATRIURETIC PEPTIDE - Abnormal; Notable for the following components:   B Natriuretic Peptide 314.3 (*)    All other components within normal limits  RESP PANEL BY RT-PCR (FLU A&B, COVID) ARPGX2  TROPONIN I (HIGH SENSITIVITY)   ____________________________________________  EKG  ED ECG REPORT I, Blake Divine, the attending physician, personally viewed and interpreted this ECG.   Date: 10/31/2021  EKG Time: 9:28  Rate: 69  Rhythm: normal sinus rhythm  Axis: LAD  Intervals:left anterior fascicular block  ST&T Change: None   PROCEDURES  Procedure(s) performed (including Critical  Care):  .Critical Care Performed by: Blake Divine, MD Authorized by: Blake Divine, MD   Critical care provider statement:    Critical care time (minutes):  45   Critical care time was exclusive of:  Separately billable procedures and treating other patients and teaching time   Critical care was necessary to treat or prevent imminent or life-threatening deterioration of the following conditions:  Respiratory failure   Critical care was time spent personally by me on the following activities:  Ordering and performing treatments and interventions, ordering and review of laboratory studies, ordering and review of radiographic studies, pulse oximetry, re-evaluation of patient's condition, review of old charts, development of treatment plan with patient or surrogate, evaluation of patient's response to treatment and examination of patient   I assumed direction of critical care for this patient from another provider in my specialty: no     Care discussed with: admitting provider     ____________________________________________   INITIAL IMPRESSION / [place] / ED COURSE      87 year old female with past medical history of hypertension, diabetes, atrial fibrillation on Eliquis, hypothyroidism, and CAD who presents to the ED with episode of chest pressure and shortness of breath upon waking this morning that has improved at the time of arrival to the ED.  EKG shows no evidence of arrhythmia or ischemia, patient now maintaining O2 sats on room air.  We will check labs, including 2 sets of troponin, as well as chest x-ray.  Her recent catheterization showed 99% stenosis of her proximal RCA but with good collateral flow and plan was to manage medically.  Initial labs are unremarkable, troponin within normal limits.  Chest x-ray reviewed by me and shows some pulmonary edema concerning for new onset CHF.  On reassessment, patient reports that chest pain has resolved but she is mildly  Tachypneic with O2 sats of 89%.  She has no history of COPD and has not  required oxygen in the past, was placed on 2 L nasal cannula with improvement in oxygen levels.  We will diurese with IV Lasix and case discussed with hospitalist for admission for new onset CHF with hypoxic respiratory failure.      ____________________________________________   FINAL CLINICAL IMPRESSION(S) / ED DIAGNOSES  Final diagnoses:  Acute congestive heart failure, unspecified heart failure type ([place])  Chest pain, unspecified type     ED Discharge Orders     None        Note:  This document was prepared using Dragon voice recognition software and may include unintentional dictation errors.    Blake Divine, MD 10/31/21 1059

## 2021-10-31 NOTE — H&P (Addendum)
History and Physical    Alison Richardson PIR:[id number] DOB: 01-20-1933 DOA: 10/31/2021  Referring MD/NP/PA:   PCP: Rusty Aus, MD   Patient coming from:  The patient is coming from rehab facility   Chief Complaint: SOB  HPI: Alison Richardson is a 87 y.o. female with medical history significant of sCHF (The left ventricular ejection fraction is 40-45% per recent caridiac cath report), CAD, hypertension, hyperlipidemia, diabetes mellitus, GERD, hypothyroidism, depression with anxiety, OSA not on CPAP, PVD, overactive bladder, Alzheimer's disease, cor pulmonale, atrial fibrillation on Eliquis, cerebral aneurysm, syncope, who presents with shortness of breath.  Patient was recently hospitalized from 10/18-10/25 due to non-STEMI.  Patient had cardiac cath which showed diffused right coronary atherosclerosis, with good collaterals from left to right and not ideal for PCI and stent placement. Pt was medically managed w/ plavix as per cardio.  Patient states that she started having shortness of breath since last night, which has been progressively worsening.  Patient had some chest pressure earlier, which has resolved.  Currently no active chest pain.  Patient does not have cough, fever or chills.  Patient is not using oxygen normally, but was found to have oxygen desaturation to 88% on room air, which improved to 95% on 2 L oxygen.  No nausea vomiting, diarrhea or abdominal pain.  No symptoms of UTI.  Patient states that she is taking Eliquis for A. Fib consistently.  ED Course: pt was found to have BNP 314, troponin level 13, pending COVID-19 PCR, electrolytes renal function okay, temperature normal, blood pressure 155/88, heart rate 70, RR 29, chest x-ray showed pulmonary edema.  Patient is admitted to progressive bed as inpatient  Review of Systems:   General: no fevers, chills, no body weight gain,  has fatigue HEENT: no blurry vision, hearing changes or sore throat Respiratory: has dyspnea,  coughing, no wheezing CV: has chest pressure, no palpitations GI: no nausea, vomiting, abdominal pain, diarrhea, constipation GU: no dysuria, burning on urination, increased urinary frequency, hematuria  Ext: has leg edema Neuro: no unilateral weakness, numbness, or tingling, no vision change or hearing loss Skin: no rash, no skin tear. MSK: No muscle spasm, no deformity, no limitation of range of movement in spin Heme: No easy bruising.  Travel history: No recent long distant travel.  Allergy:  Allergies  Allergen Reactions   Pantoprazole Nausea And Vomiting   Metformin And Related Diarrhea    Past Medical History:  Diagnosis Date   Arrhythmia    Atrial flutter ([place]) 01/2018   new onset    Basal cell carcinoma of back    Basal cell carcinoma of lip    Cerebral aneurysm    followed by [facility]   CHF (congestive heart failure) (HCC)    DDD (degenerative disc disease), lumbar    superior plate depression, L3 08/18/2014   Diabetes mellitus type II, controlled ([place])    Diverticulosis    Dysrhythmia    Paroxysmal Supraventricular Tachycardia   Dysthymia    depression   GERD (gastroesophageal reflux disease)    History of meniscal tear    Hyperlipidemia    Hypertension    Hypothyroidism    Late onset Alzheimer's disease with behavioral disturbance ([place])    Mild pulmonary hypertension (HCC)    Overactive bladder    Peripheral vascular disease (HCC)    Seasonal allergic rhinitis    Sleep apnea     Past Surgical History:  Procedure Laterality Date   APPENDECTOMY  1946   BASAL  CELL CARCINOMA EXCISION     2006 and 2009 removed from back and lip   CARDIOVASCULAR STRESS TEST  2015   nuclear cardiac stress test negative for ischemia - Dr. Satira Sark   CATARACT EXTRACTION, BILATERAL  2006   COLONOSCOPY  2014   COLONOSCOPY N/A 08/19/2020   Procedure: COLONOSCOPY;  Surgeon: Lesly Rubenstein, MD;  Location: [name] ENDOSCOPY;  Service: Endoscopy;  Laterality: N/A;   ECTOPIC PREGNANCY  SURGERY  1957   ESOPHAGOGASTRODUODENOSCOPY N/A 08/19/2020   Procedure: ESOPHAGOGASTRODUODENOSCOPY (EGD);  Surgeon: Lesly Rubenstein, MD;  Location: [facility] ENDOSCOPY;  Service: Endoscopy;  Laterality: N/A;   INJECTION KNEE Left 05/2015   LEFT HEART CATH AND CORONARY ANGIOGRAPHY N/A 10/12/2021   Procedure: LEFT HEART CATH AND CORONARY ANGIOGRAPHY;  Surgeon: Corey Skains, MD;  Location: [facility];  Service: Cardiovascular;  Laterality: N/A;   REPLACEMENT TOTAL KNEE Left 09/06/2016   Dr. Barnet Pall   TONSILLECTOMY AND ADENOIDECTOMY  1953   TOTAL ABDOMINAL HYSTERECTOMY  1986   [facility]    Social History:  reports that she has never smoked. She has never used smokeless tobacco. She reports current alcohol use of about 3.0 standard drinks per week. She reports that she does not use drugs.  Family History:  Family History  Problem Relation Age of Onset   Hypertension Mother    CAD Father    Diabetes Sister    Diabetes Paternal Uncle      Prior to Admission medications   Medication Sig Start Date End Date Taking? Authorizing Provider  acetaminophen (TYLENOL) 325 MG tablet Take 650 mg by mouth every 4 (four) hours as needed.   Yes [provider]  amiodarone (PACERONE) 200 MG tablet Take 200 mg by mouth daily.  01/02/19  Yes [provider]  bisacodyl (DULCOLAX) 5 MG EC tablet Take 5 mg by mouth daily as needed for moderate constipation.   Yes [provider]  cetirizine (ZYRTEC) 10 MG tablet Take 10 mg by mouth daily.   Yes [provider]  clopidogrel (PLAVIX) 75 MG tablet Take 1 tablet (75 mg total) by mouth daily. 10/18/21 11/17/21 Yes Wyvonnia Dusky, MD  diclofenac Sodium (VOLTAREN) 1 % GEL Apply 2 g topically 4 (four) times daily.   Yes [provider]  donepezil (ARICEPT) 10 MG tablet Take 10 mg by mouth at bedtime.   Yes [provider]  ELIQUIS 5 MG TABS tablet Take 5 mg by mouth 2 (two) times daily.  05/06/20  Yes  [provider]  furosemide (LASIX) 20 MG tablet Take 1 tablet (20 mg total) by mouth daily. 10/07/20  Yes Enzo Bi, MD  gabapentin (NEURONTIN) 100 MG capsule Take 100 mg by mouth 2 (two) times daily. 05/06/20  Yes [provider]  gentamicin ointment (GARAMYCIN) 0.1 % Apply topically 4 (four) times daily. 10/17/21  Yes Wyvonnia Dusky, MD  glipiZIDE (GLUCOTROL XL) 2.5 MG 24 hr tablet Take 2.5 mg by mouth daily. 09/19/21  Yes [provider]  insulin NPH Human (NOVOLIN N) 100 UNIT/ML injection Inject 3-10 Units into the skin 4 (four) times daily -  before meals and at bedtime.   Yes [provider]  isosorbide mononitrate (IMDUR) 30 MG 24 hr tablet Take 1 tablet (30 mg total) by mouth daily. 10/18/21 11/17/21 Yes Wyvonnia Dusky, MD  levothyroxine (SYNTHROID) 88 MCG tablet Take 88 mcg by mouth daily.  09/21/19  Yes [provider]  lisinopril (ZESTRIL) 5 MG tablet  Take 1 tablet (5 mg total) by mouth daily. 10/18/21 11/17/21 Yes Wyvonnia Dusky, MD  loperamide (IMODIUM) 2 MG capsule Take 4 mg by mouth as needed for diarrhea or loose stools.   Yes [provider]  metoprolol tartrate (LOPRESSOR) 100 MG tablet Take 1 tablet (100 mg total) by mouth 2 (two) times daily. 10/17/21 11/16/21 Yes Wyvonnia Dusky, MD  potassium chloride (MICRO-K) 10 MEQ CR capsule Take 10 mEq by mouth 2 (two) times daily.    Yes [provider]  pravastatin (PRAVACHOL) 20 MG tablet Take 20 mg by mouth at bedtime. 07/05/20  Yes [provider]  venlafaxine XR (EFFEXOR-XR) 75 MG 24 hr capsule Take 75 mg by mouth daily with breakfast.    Yes [provider]  ciclopirox (PENLAC) 8 % solution Apply topically at bedtime. Apply to nail/surrounding skin and daily over previous coat. Every 7 days remove with alcohol and continue. Patient not taking: Reported on 10/31/2021 09/18/21   Hyatt, Max T, DPM  predniSONE (DELTASONE) 10 MG tablet 40 mg  daily x 2 days, 30 mg daily x 2 days, 20 mg daily x 2 days, 10 mg daily x 2 days then stop Patient not taking: Reported on 10/31/2021 10/17/21   Wyvonnia Dusky, MD    Physical Exam: Vitals:   10/31/21 0931 10/31/21 1038 10/31/21 1040 10/31/21 1100  BP:   140/87 (!) 157/82  Pulse:  63 63 70  Resp:  (!) 29 (!) 26 (!) 25  Temp:      TempSrc:      SpO2:  95% 94% 96%  Weight: 76.7 kg     Height: 5\' 6"  (1.676 m)      General: Not in acute distress HEENT:       Eyes: PERRL, EOMI, no scleral icterus.       ENT: No discharge from the ears and nose, no pharynx injection, no tonsillar enlargement.        Neck: positive JVD, no bruit, no mass felt. Heme: No neck lymph node enlargement. Cardiac: S1/S2, RRR, No murmurs, No gallops or rubs. Respiratory: has find crackles bilaterally GI: Soft, nondistended, nontender, no rebound pain, no organomegaly, BS present. GU: No hematuria Ext: has trace leg edema bilaterally. 1+DP/PT pulse bilaterally. Musculoskeletal: No joint deformities, No joint redness or warmth, no limitation of ROM in spin. Skin: No rashes.  Neuro: Alert, oriented X3, cranial nerves II-XII grossly intact, moves all extremities normally.  Psych: Patient is not psychotic, no suicidal or hemocidal ideation.  Labs on Admission: I have personally reviewed following labs and imaging studies  CBC: Recent Labs  Lab 10/31/21 0940  WBC 14.9*  NEUTROABS 12.0*  HGB 12.5  HCT 38.0  MCV 94.3  PLT 355   Basic Metabolic Panel: Recent Labs  Lab 10/31/21 0940  NA 137  K 4.3  CL 103  CO2 25  GLUCOSE 186*  BUN 11  CREATININE 0.67  CALCIUM 8.6*   GFR: Estimated Creatinine Clearance: 52.8 mL/min (by C-G formula based on SCr of 0.67 mg/dL). Liver Function Tests: No results for input(s): AST, ALT, ALKPHOS, BILITOT, PROT, ALBUMIN in the last 168 hours. No results for input(s): LIPASE, AMYLASE in the last 168 hours. No results for input(s): AMMONIA in the last 168  hours. Coagulation Profile: No results for input(s): INR, PROTIME in the last 168 hours. Cardiac Enzymes: No results for input(s): CKTOTAL, CKMB, CKMBINDEX, TROPONINI in the last 168 hours. BNP (last 3 results) No results for input(s): PROBNP  in the last 8760 hours. HbA1C: No results for input(s): HGBA1C in the last 72 hours. CBG: Recent Labs  Lab 10/31/21 1146  GLUCAP 126*   Lipid Profile: No results for input(s): CHOL, HDL, LDLCALC, TRIG, CHOLHDL, LDLDIRECT in the last 72 hours. Thyroid Function Tests: No results for input(s): TSH, T4TOTAL, FREET4, T3FREE, THYROIDAB in the last 72 hours. Anemia Panel: No results for input(s): VITAMINB12, FOLATE, FERRITIN, TIBC, IRON, RETICCTPCT in the last 72 hours. Urine analysis:    Component Value Date/Time   COLORURINE YELLOW (A) 10/18/2021 1421   APPEARANCEUR CLEAR (A) 10/18/2021 1421   LABSPEC 1.012 10/18/2021 1421   PHURINE 5.0 10/18/2021 1421   GLUCOSEU NEGATIVE 10/18/2021 1421   HGBUR SMALL (A) 10/18/2021 1421   BILIRUBINUR NEGATIVE 10/18/2021 1421   KETONESUR NEGATIVE 10/18/2021 1421   PROTEINUR NEGATIVE 10/18/2021 1421   NITRITE NEGATIVE 10/18/2021 1421   LEUKOCYTESUR NEGATIVE 10/18/2021 1421   Sepsis Labs: @LABRCNTIP (procalcitonin:4,lacticidven:4) ) Recent Results (from the past 240 hour(s))  Resp Panel by RT-PCR (Flu A&B, Covid) Nasopharyngeal Swab     Status: None   Collection Time: 10/31/21 10:43 AM   Specimen: Nasopharyngeal Swab; Nasopharyngeal(NP) swabs in vial transport medium  Result Value Ref Range Status   SARS Coronavirus 2 by RT PCR NEGATIVE NEGATIVE Final    Comment: (NOTE) SARS-CoV-2 target nucleic acids are NOT DETECTED.  The SARS-CoV-2 RNA is generally detectable in upper respiratory specimens during the acute phase of infection. The lowest concentration of SARS-CoV-2 viral copies this assay can detect is 138 copies/mL. A negative result does not preclude SARS-Cov-2 infection and should not be used as  the sole basis for treatment or other patient management decisions. A negative result may occur with  improper specimen collection/handling, submission of specimen other than nasopharyngeal swab, presence of viral mutation(s) within the areas targeted by this assay, and inadequate number of viral copies(<138 copies/mL). A negative result must be combined with clinical observations, patient history, and epidemiological information. The expected result is Negative.  Fact Sheet for Patients:  EntrepreneurPulse.com.au  Fact Sheet for Healthcare Providers:  IncredibleEmployment.be  This test is no t yet approved or cleared by the [place] FDA and  has been authorized for detection and/or diagnosis of SARS-CoV-2 by FDA under an Emergency Use Authorization (EUA). This EUA will remain  in effect (meaning this test can be used) for the duration of the COVID-19 declaration under Section 564(b)(1) of the Act, 21 U.S.C.section 360bbb-3(b)(1), unless the authorization is terminated  or revoked sooner.       Influenza A by PCR NEGATIVE NEGATIVE Final   Influenza B by PCR NEGATIVE NEGATIVE Final    Comment: (NOTE) The Xpert Xpress SARS-CoV-2/FLU/RSV plus assay is intended as an aid in the diagnosis of influenza from Nasopharyngeal swab specimens and should not be used as a sole basis for treatment. Nasal washings and aspirates are unacceptable for Xpert Xpress SARS-CoV-2/FLU/RSV testing.  Fact Sheet for Patients: EntrepreneurPulse.com.au  Fact Sheet for Healthcare Providers: IncredibleEmployment.be  This test is not yet approved or cleared by the [place] FDA and has been authorized for detection and/or diagnosis of SARS-CoV-2 by FDA under an Emergency Use Authorization (EUA). This EUA will remain in effect (meaning this test can be used) for the duration of the COVID-19 declaration under Section 564(b)(1) of  the Act, 21 U.S.C. section 360bbb-3(b)(1), unless the authorization is terminated or revoked.  Performed at [facility], [address] [place], [place] [address]      Radiological Exams  on Admission: DG Chest 2 View  Result Date: 10/31/2021 CLINICAL DATA:  Shortness of breath. EXAM: CHEST - 2 VIEW COMPARISON:  Chest x-ray dated October 09, 2021. FINDINGS: Unchanged borderline cardiomegaly. New pulmonary vascular congestion and mild diffuse interstitial thickening. New trace bilateral pleural effusions. No consolidation or pneumothorax. No acute osseous abnormality. IMPRESSION: 1. New mild congestive heart failure. Electronically Signed   By: Titus Dubin M.D.   On: 10/31/2021 10:16     EKG: I have personally reviewed.  Sinus rhythm, QTC 459, LAD, poor R wave progression  Assessment/Plan Principal Problem:   Acute on chronic systolic CHF (congestive heart failure) (HCC) Active Problems:   Type 2 diabetes mellitus with diabetic neuropathy (HCC)   Benign essential hypertension   Hypothyroidism   Leukocytosis   Late onset Alzheimer's disease without behavioral disturbance (HCC)   HLD (hyperlipidemia)   PAF (paroxysmal atrial fibrillation) (HCC)   CAD (coronary artery disease)   Acute respiratory failure with hypoxia (HCC)   Acute respiratory failure with hypoxia due to acute on chronic systolic CHF: Patient had a 2D echo on 10/07/2020 showed EF 60 to 65% with grade 1 diastolic dysfunction.  Recent cardiac cath showed EF of 40-45%.  Patient has new 2 L oxygen requirement today.  Patient has leg edema, positive JVD, elevated BNP 314, pulmonary edema on chest x-ray, clinically consistent CHF exacerbation.  -Will admit to cardiac tele bed as inpatient -Lasix 40 mg bid by IV -2d echo -Daily weights -strict I/O's -Low salt diet -Fluid restriction -Obtain REDs Vest reading -Nasal cannula oxygen to maintain oxygen saturation above 93%  Type 2 diabetes mellitus with  diabetic neuropathy [facility]): Recent A1c 7.7, poorly controlled.  Patient is taking glipizide, NPH insulin -Sliding scale insulin  Benign essential hypertension -IV hydralazine as needed -Imdur, lisinopril, metoprolol -IV hydralazine as needed  Hypothyroidism -Synthroid  Leukocytosis: WBC 14.9.  This seems to be a chronic issue.  Patient has WBC 19.2 on 10/18/2021.  No source of infection identified.  No fever.  Likely reactive -Follow-up with CBC  Late onset Alzheimer's disease without behavioral disturbance (HCC) -Continue home donepezil  HLD (hyperlipidemia) -Pravastatin  PAF (paroxysmal atrial fibrillation) (HCC) -Continue metoprolol, amiodarone, and Eliquis  CAD (coronary artery disease) -Plavix, pravastatin, imdur    DVT ppx: on Eliquis Code Status: DNR (patient has a DNR form from facility) Family Communication:  Yes, patient's daughter by phone Disposition Plan:  Anticipate discharge back to previous environment, rehab facility Consults called:  none Admission status and Level of care: Progressive:   as inpt         Status is: Inpatient  Remains inpatient appropriate because: Patient has multiple comorbidities, including recent non-STEMI, who presents with acute on chronic CHF exacerbation.  Patient has new oxygen requirement.  Her presentation is highly complicated.  Given her older age, patient is at high risk of deteriorating.  Will need to be treated in hospital for at least 2 days          Date of Service 10/31/2021    [facility]   If 7PM-7AM, please contact night-coverage [email or web address] 10/31/2021, 1:43 PM

## 2021-10-31 NOTE — ED Notes (Addendum)
SBAR sent to the wrong RN. Messaged assigned RN, Vernia Buff

## 2021-10-31 NOTE — ED Triage Notes (Signed)
Pt come from [facility] with C/O SHOB. Pt reports she woke up this morning with the [facility] Denies chest pain at this time. Per EMS pt was 88% on RA.

## 2021-10-31 NOTE — ED Notes (Signed)
Pt consistently 89% on RA. Pt was placed on 2L by Dr. Charna Archer.

## 2021-10-31 NOTE — ED Notes (Signed)
Daughter at bedside.

## 2021-10-31 NOTE — Progress Notes (Signed)
*  PRELIMINARY RESULTS* Echocardiogram 2D Echocardiogram has been performed.  Alison Richardson 10/31/2021, 12:58 PM

## 2021-11-01 ENCOUNTER — Encounter: Payer: Self-pay | Admitting: Internal Medicine

## 2021-11-01 LAB — GLUCOSE, CAPILLARY
Glucose-Capillary: 147 mg/dL — ABNORMAL HIGH (ref 70–99)
Glucose-Capillary: 185 mg/dL — ABNORMAL HIGH (ref 70–99)
Glucose-Capillary: 125 mg/dL — ABNORMAL HIGH (ref 70–99)
Glucose-Capillary: 167 mg/dL — ABNORMAL HIGH (ref 70–99)

## 2021-11-01 LAB — BASIC METABOLIC PANEL
Anion gap: 8 (ref 5–15)
BUN: 15 mg/dL (ref 8–23)
CO2: 28 mmol/L (ref 22–32)
Calcium: 8.3 mg/dL — ABNORMAL LOW (ref 8.9–10.3)
Chloride: 104 mmol/L (ref 98–111)
Creatinine, Ser: 0.77 mg/dL (ref 0.44–1.00)
GFR, Estimated: >60 mL/min (ref >60)
Glucose, Bld: 155 mg/dL — ABNORMAL HIGH (ref 70–99)
Potassium: 3.3 mmol/L — ABNORMAL LOW (ref 3.5–5.1)
Sodium: 140 mmol/L (ref 135–145)

## 2021-11-01 LAB — MAGNESIUM: Magnesium: 2.3 mg/dL (ref 1.7–2.4)

## 2021-11-01 MED ORDER — CHLORHEXIDINE GLUCONATE CLOTH 2 % EX PADS: 6.0000 | MEDICATED_PAD | Freq: Every day | CUTANEOUS | Status: DC

## 2021-11-01 MED ORDER — POTASSIUM CHLORIDE CRYS ER 20 MEQ PO TBCR
40.0000 meq | EXTENDED_RELEASE_TABLET | Freq: Two times a day (BID) | ORAL | Status: AC
  Administered 2021-11-01 (×2): 40 meq via ORAL
  Filled 2021-11-01 (×2): qty 2

## 2021-11-01 MED ORDER — ORAL CARE MOUTH RINSE
15.0000 mL | Freq: Two times a day (BID) | OROMUCOSAL | Status: DC
  Administered 2021-11-01 (×2): 15 mL via OROMUCOSAL

## 2021-11-01 MED ORDER — MUPIROCIN 2 % EX OINT
1.0000 "application " | TOPICAL_OINTMENT | Freq: Two times a day (BID) | CUTANEOUS | Status: DC
  Administered 2021-11-01 – 2021-11-02 (×3): 1 via NASAL
  Filled 2021-11-01: qty 22

## 2021-11-01 MED ORDER — IPRATROPIUM-ALBUTEROL 0.5-2.5 (3) MG/3ML IN SOLN: 3.0000 mL | RESPIRATORY_TRACT | Status: DC | PRN

## 2021-11-01 NOTE — Progress Notes (Signed)
PROGRESS NOTE  Alison Richardson    DOB: 21-Dec-1933, 87 y.o.  WPY:[id number]  PCP: Rusty Aus, MD   Code Status: DNR   DOA: 10/31/2021   LOS: 1  Brief Narrative of Current Hospitalization  Alison Richardson is a 87 y.o. female with a PMH significant for HFrEF, CAD, HTN, HLD, type II DM, GERD, hypothyroidism, depression, anxiety, OSA not on CPAP, PVD, overactive bladder, dementia, cor pulmonale, A. fib on Eliquis, cerebral aneurysm, history of syncope. They presented from SNF to the ED on 10/31/2021 with progressively worsening shortness of breath x 1 days. In the ED, it was found that they had hypoxia on room air. They were treated with supplemental oxygen and diuresis.  Patient was admitted to medicine service for further workup and management of acute respiratory failure secondary to congestive heart failure as outlined in detail below.  11/01/21 -stable, improved  Assessment & Plan  Principal Problem:   Acute on chronic systolic CHF (congestive heart failure) (HCC) Active Problems:   Type 2 diabetes mellitus with diabetic neuropathy (HCC)   Benign essential hypertension   Hypothyroidism   Leukocytosis   Late onset Alzheimer's disease without behavioral disturbance (HCC)   HLD (hyperlipidemia)   PAF (paroxysmal atrial fibrillation) (HCC)   CAD (coronary artery disease)   Acute respiratory failure with hypoxia (HCC)   Acute on chronic diastolic CHF (congestive heart failure) ([place])  Acute hypoxic respiratory failure-patient initially on 2 L this morning and endorses improvement in respiratory status.  She has not attempted to get up and walk around much but notes that she was short of breath when she went to the bathroom.  No oxygen requirement at baseline.  Chest x-ray positive for congestion.  Patient remains hypervolemic on exam with rales on respiratory auscultation. -Wean patient to room air as tolerated -Ambulate with pulse ox -PT/OT -DuoNebs as needed  Acute on chronic HFrEF.   EF 45 to 50% on echo yesterday.  Patient continues to have subtle signs of fluid overload on exam today.  Patient is -2.5 L since admission.  Unfortunately, patient's weight has increased since admission from 169 pounds to 173 pounds.  So, we will repeat weight in a.m. to help determine dry weight and dosage of home diuretics. -Continue IV diuresis and attempt to transition to torsemide tomorrow morning if exam is improved. -Daily weights -Strict I's/O  Type II DM with neuropathy- A1c 7.7 which is below goal of 8 for age. -Continue home glipizide -Monitor CBGs with daily labs -Recommend add on SGLT2 inhibitor for its therapy of diabetes and heart failure  Hypokalemia-potassium 3.3 today likely in setting of diuresis -57mEq Kdur x2 - BMP am  Hypothyroidism-chronic, stable -Continue home Synthroid  Dementia- chronic, stable -Continue home donepezil  CAD with history of MI  HLD  PAF  HTN- no chest pain currently  -Continue Plavix, pravastatin, Imdur -Continue metoprolol, amiodarone, lisinopril -Continue Eliquis  Leukocytosis-chronic, stable  DVT prophylaxis:  apixaban (ELIQUIS) tablet 5 mg   Diet:  Diet Orders (From admission, onward)     Start     Ordered   10/31/21 1131  Diet 2 gram sodium Room service appropriate? Yes; Fluid consistency: Thin  Diet effective now       Question Answer Comment  Room service appropriate? Yes   Fluid consistency: Thin      10/31/21 1130            Subjective 11/01/21    Pt reports great improvement in her status clinically.  She notices  mild swelling in her legs and still some anxiety with her respiratory status.  She has not exerted herself much but does endorse some shortness of breath when getting up to use the restroom today.  Disposition Plan & Communication  Patient status: Inpatient  Admitted From: SNF Disposition: Skilled nursing facility Anticipated discharge date: 11/10  Family Communication: None Consults,  Procedures, Significant Events  Consultants:  None  Procedures/significant events:  None Antimicrobials:  Anti-infectives (From admission, onward)    None       Objective   Vitals:   10/31/21 2130 10/31/21 2139 10/31/21 2340 11/01/21 0321  BP: 110/62  (!) 101/55 109/62  Pulse: (!) 55  (!) 59 (!) 55  Resp: 18  18 16   Temp: 98.2 F (36.8 C)  98 F (36.7 C) 98.6 F (37 C)  TempSrc:    Oral  SpO2: 97%  95% 98%  Weight:  78.2 kg  78.4 kg  Height:  5\' 6"  (1.676 m)      Intake/Output Summary (Last 24 hours) at 11/01/2021 0730 Last data filed at 11/01/2021 0600 Gross per 24 hour  Intake 120 ml  Output 2000 ml  Net -1880 ml   Filed Weights   10/31/21 0931 10/31/21 2139 11/01/21 0321  Weight: 76.7 kg 78.2 kg 78.4 kg    Patient BMI: Body mass index is 27.9 kg/m.   Physical Exam: General: awake, alert, NAD HEENT: atraumatic, clear conjunctiva, anicteric sclera, moist mucus membranes, hearing grossly normal Respiratory: normal respiratory effort.  Positive rales at bilateral lung bases, negative wheezing Cardiovascular: normal S1/S2,  RRR, murmurs, rubs, gallops, quick capillary refill.  Positive JVD Nervous: A&O x3. no gross focal neurologic deficits, normal speech Extremities: moves all equall, trace edema, normal tone Skin: dry, intact, normal temperature, normal color, No rashes, lesions or ulcers Psychiatry: Mildly anxious mood, congruent affect  Labs   I have personally reviewed following labs and imaging studies Admission on 10/31/2021  Component Date Value Ref Range Status   WBC 10/31/2021 14.9 (A)  4.0 - 10.5 K/uL Final   RBC 10/31/2021 4.03  3.87 - 5.11 MIL/uL Final   Hemoglobin 10/31/2021 12.5  12.0 - 15.0 g/dL Final   HCT 10/31/2021 38.0  36.0 - 46.0 % Final   MCV 10/31/2021 94.3  80.0 - 100.0 fL Final   MCH 10/31/2021 31.0  26.0 - 34.0 pg Final   MCHC 10/31/2021 32.9  30.0 - 36.0 g/dL Final   RDW 10/31/2021 14.6  11.5 - 15.5 % Final   Platelets  10/31/2021 257  150 - 400 K/uL Final   nRBC 10/31/2021 0.0  0.0 - 0.2 % Final   Neutrophils Relative % 10/31/2021 81  % Final   Neutro Abs 10/31/2021 12.0 (A)  1.7 - 7.7 K/uL Final   Lymphocytes Relative 10/31/2021 10  % Final   Lymphs Abs 10/31/2021 1.5  0.7 - 4.0 K/uL Final   Monocytes Relative 10/31/2021 8  % Final   Monocytes Absolute 10/31/2021 1.2 (A)  0.1 - 1.0 K/uL Final   Eosinophils Relative 10/31/2021 0  % Final   Eosinophils Absolute 10/31/2021 0.1  0.0 - 0.5 K/uL Final   Basophils Relative 10/31/2021 0  % Final   Basophils Absolute 10/31/2021 0.1  0.0 - 0.1 K/uL Final   Immature Granulocytes 10/31/2021 1  % Final   Abs Immature Granulocytes 10/31/2021 0.10 (A)  0.00 - 0.07 K/uL Final   Sodium 10/31/2021 137  135 - 145 mmol/L Final   Potassium 10/31/2021 4.3  3.5 - 5.1 mmol/L Final   Chloride 10/31/2021 103  98 - 111 mmol/L Final   CO2 10/31/2021 25  22 - 32 mmol/L Final   Glucose, Bld 10/31/2021 186 (A)  70 - 99 mg/dL Final   BUN 10/31/2021 11  8 - 23 mg/dL Final   Creatinine, Ser 10/31/2021 0.67  0.44 - 1.00 mg/dL Final   Calcium 10/31/2021 8.6 (A)  8.9 - 10.3 mg/dL Final   GFR, Estimated 10/31/2021 >60  >60 mL/min Final   Anion gap 10/31/2021 9  5 - 15 Final   Troponin I (High Sensitivity) 10/31/2021 13  <18 ng/L Final   B Natriuretic Peptide 10/31/2021 314.3 (A)  0.0 - 100.0 pg/mL Final   SARS Coronavirus 2 by RT PCR 10/31/2021 NEGATIVE  NEGATIVE Final   Influenza A by PCR 10/31/2021 NEGATIVE  NEGATIVE Final   Influenza B by PCR 10/31/2021 NEGATIVE  NEGATIVE Final   Weight 10/31/2021 2,704  oz Final   Height 10/31/2021 66  in Final   BP 10/31/2021 157/82  mmHg Final   Ao pk vel 10/31/2021 1.10  m/s Final   AV Area VTI 10/31/2021 2.17  cm2 Final   AR max vel 10/31/2021 2.66  cm2 Final   AV Mean grad 10/31/2021 3.0  mmHg Final   AV Peak grad 10/31/2021 4.8  mmHg Final   S' Lateral 10/31/2021 3.00  cm Final   AV Area mean vel 10/31/2021 2.52  cm2 Final   Area-P 1/2  10/31/2021 3.03  cm2 Final   MV VTI 10/31/2021 1.84  cm2 Final   Glucose-Capillary 10/31/2021 126 (A)  70 - 99 mg/dL Final   Sodium 11/01/2021 140  135 - 145 mmol/L Final   Potassium 11/01/2021 3.3 (A)  3.5 - 5.1 mmol/L Final   Chloride 11/01/2021 104  98 - 111 mmol/L Final   CO2 11/01/2021 28  22 - 32 mmol/L Final   Glucose, Bld 11/01/2021 155 (A)  70 - 99 mg/dL Final   BUN 11/01/2021 15  8 - 23 mg/dL Final   Creatinine, Ser 11/01/2021 0.77  0.44 - 1.00 mg/dL Final   Calcium 11/01/2021 8.3 (A)  8.9 - 10.3 mg/dL Final   GFR, Estimated 11/01/2021 >60  >60 mL/min Final   Anion gap 11/01/2021 8  5 - 15 Final   Magnesium 11/01/2021 2.3  1.7 - 2.4 mg/dL Final   Glucose-Capillary 10/31/2021 167 (A)  70 - 99 mg/dL Final   Glucose-Capillary 10/31/2021 84  70 - 99 mg/dL Final    Imaging Studies  DG Chest 2 View  Result Date: 10/31/2021 CLINICAL DATA:  Shortness of breath. EXAM: CHEST - 2 VIEW COMPARISON:  Chest x-ray dated October 09, 2021. FINDINGS: Unchanged borderline cardiomegaly. New pulmonary vascular congestion and mild diffuse interstitial thickening. New trace bilateral pleural effusions. No consolidation or pneumothorax. No acute osseous abnormality. IMPRESSION: 1. New mild congestive heart failure. Electronically Signed   By: Titus Dubin M.D.   On: 10/31/2021 10:16   ECHOCARDIOGRAM COMPLETE  Result Date: 10/31/2021    ECHOCARDIOGRAM REPORT   Patient Name:   SAMANVI CUCCIA Date of Exam: 10/31/2021 Medical Rec #:  [id number]      Height:       66.0 in Accession #:    [id number]     Weight:       169.0 lb Date of Birth:  Jun 11, 1933       BSA:          1.862 m Patient Age:  88 years       BP:           129/74 mmHg Patient Gender: F              HR:           66 bpm. Exam Location:  [facility] Procedure: 2D Echo, Color Doppler, Cardiac Doppler and Intracardiac            Opacification Agent Indications:     I50.31 congestive heart failure-Acute Diastolic  History:         Patient has prior history  of Echocardiogram examinations. PVD;                  Risk Factors:Hypertension, Diabetes and Dyslipidemia.  Sonographer:     Charmayne Sheer Referring Phys:  [id number] Ivor Costa Diagnosing Phys: Isaias Cowman MD  Sonographer Comments: Technically difficult study due to poor echo windows. IMPRESSIONS  1. Left ventricular ejection fraction, by estimation, is 45 to 50%. The left ventricle has mildly decreased function. The left ventricle has no regional wall motion abnormalities. Left ventricular diastolic parameters were normal.  2. Right ventricular systolic function is normal. The right ventricular size is normal.  3. The mitral valve is normal in structure. Mild mitral valve regurgitation. No evidence of mitral stenosis.  4. The aortic valve is normal in structure. Aortic valve regurgitation is not visualized. No aortic stenosis is present.  5. The inferior vena cava is normal in size with greater than 50% respiratory variability, suggesting right atrial pressure of 3 mmHg. FINDINGS  Left Ventricle: Left ventricular ejection fraction, by estimation, is 45 to 50%. The left ventricle has mildly decreased function. The left ventricle has no regional wall motion abnormalities. Definity contrast agent was given IV to delineate the left ventricular endocardial borders. The left ventricular internal cavity size was normal in size. There is no left ventricular hypertrophy. Left ventricular diastolic parameters were normal. Right Ventricle: The right ventricular size is normal. No increase in right ventricular wall thickness. Right ventricular systolic function is normal. Left Atrium: Left atrial size was normal in size. Right Atrium: Right atrial size was normal in size. Pericardium: There is no evidence of pericardial effusion. Mitral Valve: The mitral valve is normal in structure. Mild mitral valve regurgitation. No evidence of mitral valve stenosis. MV peak gradient, 4.2 mmHg. The mean mitral valve gradient is 1.0 mmHg.  Tricuspid Valve: The tricuspid valve is normal in structure. Tricuspid valve regurgitation is mild . No evidence of tricuspid stenosis. Aortic Valve: The aortic valve is normal in structure. Aortic valve regurgitation is not visualized. No aortic stenosis is present. Aortic valve mean gradient measures 3.0 mmHg. Aortic valve peak gradient measures 4.8 mmHg. Aortic valve area, by VTI measures 2.17 cm. Pulmonic Valve: The pulmonic valve was normal in structure. Pulmonic valve regurgitation is not visualized. No evidence of pulmonic stenosis. Aorta: The aortic root is normal in size and structure. Venous: The inferior vena cava is normal in size with greater than 50% respiratory variability, suggesting right atrial pressure of 3 mmHg. IAS/Shunts: No atrial level shunt detected by color flow Doppler.  LEFT VENTRICLE PLAX 2D LVIDd:         3.70 cm   Diastology LVIDs:         3.00 cm   LV e' lateral:   7.40 cm/s LV PW:         1.20 cm   LV E/e' lateral: 12.6 LV IVS:  0.80 cm LVOT diam:     2.20 cm LV SV:         49 LV SV Index:   26 LVOT Area:     3.80 cm  LEFT ATRIUM         Index LA diam:    3.50 cm 1.88 cm/m  AORTIC VALVE                    PULMONIC VALVE AV Area (Vmax):    2.66 cm     PV Vmax:       0.97 m/s AV Area (Vmean):   2.52 cm     PV Vmean:      67.600 cm/s AV Area (VTI):     2.17 cm     PV VTI:        0.177 m AV Vmax:           110.00 cm/s  PV Peak grad:  3.8 mmHg AV Vmean:          81.800 cm/s  PV Mean grad:  2.0 mmHg AV VTI:            0.224 m AV Peak Grad:      4.8 mmHg AV Mean Grad:      3.0 mmHg LVOT Vmax:         77.00 cm/s LVOT Vmean:        54.200 cm/s LVOT VTI:          0.128 m LVOT/AV VTI ratio: 0.57  AORTA Ao Root diam: 3.10 cm MITRAL VALVE               TRICUSPID VALVE MV Area (PHT): 3.03 cm    TR Peak grad:   36.2 mmHg MV Area VTI:   1.84 cm    TR Vmax:        301.00 cm/s MV Peak grad:  4.2 mmHg MV Mean grad:  1.0 mmHg    SHUNTS MV Vmax:       1.03 m/s    Systemic VTI:  0.13 m MV  Vmean:      47.5 cm/s   Systemic Diam: 2.20 cm MV Decel Time: 250 msec MV E velocity: 93.00 cm/s MV A velocity: 32.10 cm/s MV E/A ratio:  2.90 Isaias Cowman MD Electronically signed by Isaias Cowman MD Signature Date/Time: 10/31/2021/3:02:10 PM    Final    Medications   Scheduled Meds:  amiodarone  200 mg Oral Daily   apixaban  5 mg Oral BID   clopidogrel  75 mg Oral Daily   donepezil  10 mg Oral QHS   furosemide  40 mg Intravenous Q12H   gabapentin  100 mg Oral BID   gentamicin ointment   Topical QID   insulin aspart  0-9 Units Subcutaneous TID WC   isosorbide mononitrate  30 mg Oral Daily   levothyroxine  88 mcg Oral Daily   lisinopril  5 mg Oral Daily   loratadine  10 mg Oral Daily   mouth rinse  15 mL Mouth Rinse BID   metoprolol tartrate  100 mg Oral BID   potassium chloride  40 mEq Oral BID   pravastatin  20 mg Oral QHS   sodium chloride flush  3 mL Intravenous Q12H   venlafaxine XR  75 mg Oral Q breakfast   No recently discontinued medications to reconcile  LOS: 1 day   Time spent: >89min  Heberto Sturdevant L Ioma Chismar, DO [facility] 11/01/2021, 7:30 AM  Please refer to amion to contact the [facility] Attending or Consulting provider for this pt  [email or web address] Available by Epic secure chat 7AM-7PM. If 7PM-7AM, please contact night-coverage

## 2021-11-01 NOTE — Evaluation (Signed)
Physical Therapy Evaluation Patient Details Name: Alison Richardson MRN: [id number] DOB: [birth year] Today's Date: 11/01/2021  History of Present Illness  Alison Richardson is a 87 y.o. female with medical history significant of sCHF (The left ventricular ejection fraction is 40-45% per recent caridiac cath report), CAD, hypertension, hyperlipidemia, diabetes mellitus, GERD, hypothyroidism, depression with anxiety, OSA not on CPAP, PVD, overactive bladder, Alzheimer's disease, cor pulmonale, atrial fibrillation on Eliquis, cerebral aneurysm, syncope, who presents with shortness of breath.   Clinical Impression  Patient received up in recliner, O2 via [place] at 2 liters. Patient is motivated to go walk. She requires supervision for sit to stand and ambulated 300 feet with RW, min guard on room air with saturations between 92-96%. She has no significant sob with mobility but feels like she is slightly weaker than baseline. Patient will continue to benefit from skilled PT while here to improve strength and activity tolerance.  She would like to return to rehab for brief time prior to returning home alone.          Recommendations for follow up therapy are one component of a multi-disciplinary discharge planning process, led by the attending physician.  Recommendations may be updated based on patient status, additional functional criteria and insurance authorization.  Follow Up Recommendations Skilled nursing-short term rehab (<3 hours/day)    Assistance Recommended at Discharge Intermittent Supervision/Assistance  Functional Status Assessment Patient has had a recent decline in their functional status and demonstrates the ability to make significant improvements in function in a reasonable and predictable amount of time.  Equipment Recommendations  None recommended by PT    Recommendations for Other Services       Precautions / Restrictions Precautions Precautions: Fall Restrictions Weight Bearing  Restrictions: No      Mobility  Bed Mobility               General bed mobility comments: Pt seated in recliner beginning and end of session    Transfers Overall transfer level: Needs assistance Equipment used: Rolling walker (2 wheels) Transfers: Sit to/from Stand Sit to Stand: Supervision                Ambulation/Gait Ambulation/Gait assistance: Min guard Gait Distance (Feet): 300 Feet Assistive device: Rolling walker (2 wheels) Gait Pattern/deviations: Step-through pattern Gait velocity: slightly decreased     General Gait Details: patient ambulates with good balance and use of AD. Ambulated on room air with O2 saturation ranging 92-96%  Stairs            Wheelchair Mobility    Modified Rankin (Stroke Patients Only)       Balance Overall balance assessment: Modified Independent Sitting-balance support: Feet supported Sitting balance-Leahy Scale: Good     Standing balance support: Bilateral upper extremity supported;During functional activity Standing balance-Leahy Scale: Good                               Pertinent Vitals/Pain Pain Assessment: No/denies pain    Home Living Family/patient expects to be discharged to:: Skilled nursing facility Living Arrangements: Alone Available Help at Discharge: Available PRN/intermittently;Neighbor Type of Home: Independent living facility Home Access: [place]: One level Home Equipment: [place] (4 wheels);Shower seat;Grab bars - tub/shower;Grab bars - toilet Additional Comments: [facility] ILF, was most recently at rehab there and would like to return to rehab prior to ILF    Prior Function Prior  Level of Function : Independent/Modified Independent             Mobility Comments: independent prior ADLs Comments: independent prior     Hand Dominance   Dominant Hand: Right    Extremity/Trunk Assessment   Upper Extremity Assessment Upper  Extremity Assessment: Overall WFL for tasks assessed    Lower Extremity Assessment Lower Extremity Assessment: Generalized weakness    Cervical / Trunk Assessment Cervical / Trunk Assessment: Normal  Communication   Communication: HOH  Cognition Arousal/Alertness: Awake/alert Behavior During Therapy: WFL for tasks assessed/performed Overall Cognitive Status: Within Functional Limits for tasks assessed                                          General Comments      Exercises     Assessment/Plan    PT Assessment Patient needs continued PT services  PT Problem List Decreased mobility;Decreased activity tolerance;Decreased strength       PT Treatment Interventions DME instruction;Therapeutic activities;Gait training;Therapeutic exercise;Functional mobility training;Patient/family education    PT Goals (Current goals can be found in the Care Plan section)  Acute Rehab PT Goals Patient Stated Goal: to go to rehab then home PT Goal Formulation: With patient Time For Goal Achievement: 11/15/21 Potential to Achieve Goals: Good    Frequency Min 2X/week   Barriers to discharge Decreased caregiver support      Co-evaluation               AM-PAC PT "6 Clicks" Mobility  Outcome Measure Help needed turning from your back to your side while in a flat bed without using bedrails?: None Help needed moving from lying on your back to sitting on the side of a flat bed without using bedrails?: A Little Help needed moving to and from a bed to a chair (including a wheelchair)?: A Little Help needed standing up from a chair using your arms (e.g., wheelchair or bedside chair)?: None Help needed to walk in hospital room?: A Little Help needed climbing 3-5 steps with a railing? : A Little 6 Click Score: 20    End of Session Equipment Utilized During Treatment: Gait belt;Oxygen Activity Tolerance: Patient tolerated treatment well Patient left: in chair;with call  bell/phone within reach Nurse Communication: Mobility status PT Visit Diagnosis: Muscle weakness (generalized) (M62.81)    Time: 1426-1440 PT Time Calculation (min) (ACUTE ONLY): 14 min   Charges:   PT Evaluation $PT Eval Moderate Complexity: 1 Mod          Alison Richardson, PT, GCS 11/01/21,2:50 PM

## 2021-11-01 NOTE — NC FL2 (Signed)
[place] LEVEL OF CARE SCREENING TOOL     IDENTIFICATION  Patient Name: Alison Richardson Birthdate: [birth year] Sex: female Admission Date (Current Location): 10/31/2021  [place] and [place] Number:  Engineering geologist and Address:  [facility], [address], [place], [place] [address]      Provider Number: [id number]  Attending Physician Name and Address:  Richarda Osmond, MD  Relative Name and Phone Number:       Current Level of Care: Hospital Recommended Level of Care: [place] Prior Approval Number:    Date Approved/Denied:   PASRR Number:    Discharge Plan: SNF    Current Diagnoses: Patient Active Problem List   Diagnosis Date Noted   Acute on chronic systolic CHF (congestive heart failure) ([place]) 10/31/2021   HLD (hyperlipidemia) 10/31/2021   PAF (paroxysmal atrial fibrillation) ([place]) 10/31/2021   CAD (coronary artery disease) 10/31/2021   Acute respiratory failure with hypoxia ([place]) 10/31/2021   Acute on chronic diastolic CHF (congestive heart failure) ([place]) 10/31/2021   NSTEMI (non-ST elevated myocardial infarction) ([place]) 10/10/2021   Chronic diastolic CHF (congestive heart failure) ([place]) 10/13/2020   Acute CHF (congestive heart failure) ([place]) 10/07/2020   Elevated troponin 10/07/2020   CHF (congestive heart failure) ([place]) 10/07/2020   Leukocytosis 10/07/2020   DOE (dyspnea on exertion) 05/09/2020   Syncope and collapse 09/30/2019   Syncope 09/29/2019   Cor pulmonale, chronic ([place]) 09/27/2019   Late onset Alzheimer's disease without behavioral disturbance ([place]) 09/23/2019   Obstructive sleep apnea 07/09/2019   Diabetes mellitus type 2 in nonobese ([place]) 01/21/2019   Dyslipidemia 01/21/2019   PSVT (paroxysmal supraventricular tachycardia) ([place]) 01/21/2019   Tenosynovitis of foot 01/21/2019   PAD (peripheral artery disease) ([place]) 01/06/2019   Preop testing 01/06/2019   DM type 2 with diabetic  mixed hyperlipidemia ([place]) 09/08/2018   Peripheral neuropathy, idiopathic 09/08/2018   Anxiety disorder 06/19/2018   Depression, major, single episode, mild ([place]) 06/19/2018   Paroxysmal atrial fibrillation with rapid ventricular response ([place]) 06/19/2018   Atrial flutter ([place]) 06/19/2018   Pure hypercholesterolemia 06/19/2018   Vitamin B12 deficiency 06/19/2018   Insomnia 06/19/2018   Protein-calorie malnutrition ([place]) 06/19/2018   Type 2 diabetes mellitus with diabetic neuropathy ([place]) 06/19/2018   Knee pain 06/19/2018   Cognitive impairment, mild, so stated 06/19/2018   Osteoarthritis of right knee 06/19/2018   Benign essential hypertension 06/19/2018   Hypothyroidism 06/19/2018   Overactive bladder 06/19/2018   Generalized muscle weakness 06/19/2018   Difficulty in walking, not elsewhere classified 06/19/2018   Major depression in remission ([place]) 06/18/2018   Primary osteoarthritis involving multiple joints 06/18/2018   Mixed hyperlipidemia 11/01/2015   Cerebral aneurysm 04/29/2013    Orientation RESPIRATION BLADDER Height & Weight     Self, Time, Situation, Place  O2 ([place] 2L) Incontinent Weight: 172 lb 13.5 oz (78.4 kg) Height:  5\' 6"  (167.6 cm)  BEHAVIORAL SYMPTOMS/MOOD NEUROLOGICAL BOWEL NUTRITION STATUS      Continent Diet (2 gram sodium, thin liquids)  AMBULATORY STATUS COMMUNICATION OF NEEDS Skin   Limited Assist Verbally Normal                       Personal Care Assistance Level of Assistance  Bathing, Feeding, Dressing Bathing Assistance: Limited assistance Feeding assistance: Independent Dressing Assistance: Limited assistance Total Care Assistance: Limited assistance   Functional Limitations Info  Sight, Speech, Hearing Sight Info: Adequate Hearing Info: Adequate Speech Info: Adequate  SPECIAL CARE FACTORS FREQUENCY  PT (By licensed PT), OT (By licensed OT)     PT Frequency: 5x OT Frequency: 5x            Contractures Contractures Info:  Not present    Additional Factors Info  Allergies, Code Status Code Status Info: DNR Allergies Info: Pantoprazole, Metformin And RelatedPantoprazole, Metformin And Related           Current Medications (11/01/2021):  This is the current hospital active medication list Current Facility-Administered Medications  Medication Dose Route Frequency Provider Last Rate Last Admin   acetaminophen (TYLENOL) tablet 650 mg  650 mg Oral Q6H PRN Ivor Costa, MD       albuterol (PROVENTIL) (2.5 MG/3ML) 0.083% nebulizer solution 3 mL  3 mL Inhalation Q4H PRN Ivor Costa, MD       amiodarone (PACERONE) tablet 200 mg  200 mg Oral Daily Ivor Costa, MD   200 mg at 11/01/21 0813   apixaban (ELIQUIS) tablet 5 mg  5 mg Oral BID Ivor Costa, MD   5 mg at 11/01/21 0813   bisacodyl (DULCOLAX) EC tablet 5 mg  5 mg Oral Daily PRN Ivor Costa, MD       clopidogrel (PLAVIX) tablet 75 mg  75 mg Oral Daily Ivor Costa, MD   75 mg at 11/01/21 2585   dextromethorphan-guaiFENesin ([place] DM) 30-600 MG per 12 hr tablet 1 tablet  1 tablet Oral BID PRN Ivor Costa, MD       diclofenac Sodium (VOLTAREN) 1 % topical gel 2 g  2 g Topical QID PRN Ivor Costa, MD       donepezil (ARICEPT) tablet 10 mg  10 mg Oral QHS Ivor Costa, MD   10 mg at 10/31/21 2201   furosemide (LASIX) injection 40 mg  40 mg Intravenous Q12H Ivor Costa, MD   40 mg at 11/01/21 0814   gabapentin (NEURONTIN) capsule 100 mg  100 mg Oral BID Ivor Costa, MD   100 mg at 11/01/21 2778   gentamicin ointment (GARAMYCIN) 0.1 %   Topical QID Ivor Costa, MD   1 application at 24/23/53 2206   insulin aspart (novoLOG) injection 0-9 Units  0-9 Units Subcutaneous TID WC Ivor Costa, MD   2 Units at 11/01/21 1245   isosorbide mononitrate (IMDUR) 24 hr tablet 30 mg  30 mg Oral Daily Ivor Costa, MD   30 mg at 11/01/21 0813   levothyroxine (SYNTHROID) tablet 88 mcg  88 mcg Oral Daily Ivor Costa, MD   88 mcg at 11/01/21 6144   lisinopril (ZESTRIL) tablet 5 mg  5 mg Oral Daily Ivor Costa, MD   5 mg at 11/01/21 0815   loratadine (CLARITIN) tablet 10 mg  10 mg Oral Daily Ivor Costa, MD   10 mg at 11/01/21 0815   MEDLINE mouth rinse  15 mL Mouth Rinse BID Ivor Costa, MD       metoprolol tartrate (LOPRESSOR) tablet 100 mg  100 mg Oral BID Ivor Costa, MD   100 mg at 11/01/21 0813   ondansetron (ZOFRAN) injection 4 mg  4 mg Intravenous Q8H PRN Ivor Costa, MD       potassium chloride SA (KLOR-CON) CR tablet 40 mEq  40 mEq Oral BID Richarda Osmond, MD   40 mEq at 11/01/21 0813   pravastatin (PRAVACHOL) tablet 20 mg  20 mg Oral QHS Ivor Costa, MD   20 mg at 10/31/21 2202   sodium chloride flush (NS) 0.9 % injection  3 mL  3 mL Intravenous Q12H Ivor Costa, MD   3 mL at 11/01/21 0815   venlafaxine XR (EFFEXOR-XR) 24 hr capsule 75 mg  75 mg Oral Q breakfast Ivor Costa, MD   75 mg at 11/01/21 0813     Discharge Medications: Please see discharge summary for a list of discharge medications.  Relevant Imaging Results:  Relevant Lab Results:   Additional Information SSN: [id number]  Eileen Stanford, LCSW

## 2021-11-01 NOTE — Consult Note (Addendum)
   Heart Failure Nurse Navigator Note  HFmrEF 45 to 50%.  Normal right ventricular systolic function.  Mild mitral regurgitation.  Mild tricuspid regurgitation.  Earlier echocardiogram revealed ejection fraction of 40 to 45%.  She presented to the emergency room from rehab facility with complaints of shortness of breath.  She had been hospitalized from 10 /18-10 /25 due to non-STEMI.  Chest x-ray revealed pulmonary edema.  BNP was 314.  Comorbidities:  Atrial flutter Type 2 diabetes Depression Hyperlipidemia Hypertension Pulmonary hypertension Sleep apnea   Medications:  Amiodarone 200 mg daily Eliquis 5 mg 2 times a day Plavix 75 mg daily Furosemide 40 mg IV every 12 Isosorbide mononitrate 30 mg daily Zestril 5 mg daily Metoprolol tartrate 100 mg 2 times a day Potassium chloride 40 mEq 2 times a day Pravachol 20 mg at bedtime   Labs:  Sodium 140, potassium 3.3, chloride 104, CO2 28, BUN 15, creatinine 0.77, magnesium 2.3 Intake 120 mL Output 2000 mL Weight 78.4 kg  Initial meeting with patient today.  She states that she currently is residing in a really debilitated facility but is looking at going home in the near future.  She states that the facility that they weigh her daily.  Discussed weight gain to report.  Discussed changes in symptoms chest pain, increasing shortness of breath, lower extremity edema loss of appetite PND and orthopnea.  Discussed the importance of low-sodium diet and not using salt at the table.  She states that once a week she does not eat at a restaurant.  Went over making healthy choices when eating out.  So discussed foods that are higher in sodium.  Also talked about the importance of fluid restriction and why it is important.  She was given an appointment in the outpatient heart failure clinic on November 18 at 12:00 in the afternoon.  She has a 23% history of no-show which is 7 out of 30 appointments.  She had no further questions.  She  was given the living with heart failure teaching booklet along with his Zone magnet and information about the outpatient heart failure clinic.  Pricilla Riffle EN CHFN

## 2021-11-01 NOTE — Progress Notes (Signed)
[facility] Cross Coverage  Received secure chat from nursing staff: patients HR in 50s and blood pressure per RN is 106/60.   We will hold metoprolol 100 mg dosing this evening.  Dr. Tobie Poet

## 2021-11-02 DIAGNOSIS — E7849 Other hyperlipidemia: Secondary | ICD-10-CM

## 2021-11-02 DIAGNOSIS — I5023 Acute on chronic systolic (congestive) heart failure: Secondary | ICD-10-CM

## 2021-11-02 DIAGNOSIS — I2510 Atherosclerotic heart disease of native coronary artery without angina pectoris: Secondary | ICD-10-CM | POA: Diagnosis not present

## 2021-11-02 DIAGNOSIS — J9601 Acute respiratory failure with hypoxia: Secondary | ICD-10-CM | POA: Diagnosis not present

## 2021-11-02 LAB — BASIC METABOLIC PANEL
Anion gap: 9 (ref 5–15)
BUN: 18 mg/dL (ref 8–23)
CO2: 24 mmol/L (ref 22–32)
Calcium: 8.2 mg/dL — ABNORMAL LOW (ref 8.9–10.3)
Chloride: 103 mmol/L (ref 98–111)
Creatinine, Ser: 0.71 mg/dL (ref 0.44–1.00)
GFR, Estimated: >60 mL/min (ref >60)
Glucose, Bld: 212 mg/dL — ABNORMAL HIGH (ref 70–99)
Potassium: 3.6 mmol/L (ref 3.5–5.1)
Sodium: 136 mmol/L (ref 135–145)

## 2021-11-02 LAB — GLUCOSE, CAPILLARY
Glucose-Capillary: 155 mg/dL — ABNORMAL HIGH (ref 70–99)
Glucose-Capillary: 246 mg/dL — ABNORMAL HIGH (ref 70–99)

## 2021-11-02 MED ORDER — TORSEMIDE 20 MG PO TABS: 20.0000 mg | ORAL_TABLET | Freq: Every day | ORAL | 0 refills | Status: DC

## 2021-11-02 MED ORDER — TORSEMIDE 20 MG PO TABS
20.0000 mg | ORAL_TABLET | Freq: Every day | ORAL | Status: DC
  Administered 2021-11-02: 20 mg via ORAL
  Filled 2021-11-02: qty 1

## 2021-11-02 MED ORDER — METOPROLOL SUCCINATE ER 100 MG PO TB24
100.0000 mg | ORAL_TABLET | Freq: Every day | ORAL | Status: DC
  Administered 2021-11-02: 100 mg via ORAL
  Filled 2021-11-02: qty 1

## 2021-11-02 MED ORDER — METOPROLOL SUCCINATE ER 100 MG PO TB24: 100.0000 mg | ORAL_TABLET | Freq: Every day | ORAL | 0 refills | Status: DC

## 2021-11-02 NOTE — Progress Notes (Addendum)
Discharge instructions (including medications and follow up appointments) discussed with patient and a copy sent home.  Additional copy sent for facility.   Discussed changes in metoprolol in detail.  No further question had.    Gave report to Almyra Free, Therapist, sports at [place].

## 2021-11-02 NOTE — TOC Progression Note (Signed)
Transition of Care [facility]) - Progression Note    Patient Details  Name: Alison Richardson MRN: [id number] Date of Birth: [birth year]  Transition of Care [facility]) CM/SW Contact  Beverly Sessions, RN Phone Number: 11/02/2021, 10:32 AM  Clinical Narrative:        Damaris Schooner with Monica Martinez at [place].  He confirms that Josem Kaufmann is pending and in que to be reviewed by MD    Expected Discharge Plan and Services           Expected Discharge Date: 11/02/21                                     Social Determinants of Health (SDOH) Interventions    Readmission Risk Interventions No flowsheet data found.

## 2021-11-02 NOTE — Discharge Instructions (Signed)
Your weight today is 168lbs.  Weigh yourself when you get home and daily. If you have gained 5lbs or more, please follow up with your doctor to assess your fluid status.  Take torsemide daily. See your primary doctor and cardiologist in the next 1-2 weeks to monitor your electrolytes and medications.

## 2021-11-02 NOTE — Discharge Summary (Signed)
Physician Discharge Summary  Patient ID: Alison Richardson MRN: [id number] DOB/AGE: July 23, 1933 87 y.o.  Admit date: 10/31/2021 Discharge date: 11/02/2021  Admission Diagnoses: Acute respiratory distress Discharge Diagnoses:  Principal Problem:   Acute on chronic systolic CHF (congestive heart failure) (HCC) Active Problems:   Type 2 diabetes mellitus with diabetic neuropathy (HCC)   Benign essential hypertension   Hypothyroidism   Leukocytosis   Late onset Alzheimer's disease without behavioral disturbance (HCC)   HLD (hyperlipidemia)   PAF (paroxysmal atrial fibrillation) (HCC)   CAD (coronary artery disease)   Acute respiratory failure with hypoxia (HCC)   Acute on chronic diastolic CHF (congestive heart failure) ([place])   Discharged Condition: good  Hospital Course: Patient was hospitalized due to oxygen desaturations on room air requiring supplemental oxygenation to maintain saturations above 90%.  Clinically, she presented as heart failure exacerbation with hypervolemia contributing to her diminished respiratory status.  She was treated with diuretics and responded well.  By day 2 of hospitalization she was comfortable on room air including with exertion.  She appeared euvolemic with good urine output and weight loss of about 5 pounds from day previous.  She had repeat echo which showed ejection fraction 45 to 50%.  Her other chronic conditions remained stable on her home medications with the exception of borderline low blood pressures and heart rate but patient was asymptomatic.  Her home dose of metoprolol was slightly reduced to metoprolol succinate 100 mg daily.  She was discharged with daily torsemide and instructions to weigh herself daily to evaluate for fluid retention and given parameters to contact her primary care doctor for weight gain.  Consults: None  Significant Diagnostic Studies: cardiac graphics: Echocardiogram: 45-50%  Treatments: Diuresis  Discharge Exam: Blood  pressure 134/66, pulse 64, temperature 97.9 F (36.6 C), resp. rate 16, height 5\' 6"  (1.676 m), weight 76.2 kg, SpO2 97 %. Physical Exam Constitutional:      General: She is not in acute distress.    Appearance: She is well-developed. She is not ill-appearing, toxic-appearing or diaphoretic.  HENT:     Head: Normocephalic.  Cardiovascular:     Rate and Rhythm: Normal rate and regular rhythm.     Pulses: Normal pulses.     Heart sounds: Murmur heard.  Pulmonary:     Effort: Pulmonary effort is normal.     Breath sounds: Normal breath sounds.  Musculoskeletal:     Right lower leg: No edema.     Left lower leg: No edema.  Skin:    General: Skin is warm and dry.     Capillary Refill: Capillary refill takes less than 2 seconds.  Neurological:     General: No focal deficit present.     Mental Status: She is alert and oriented to person, place, and time.  Psychiatric:        Mood and Affect: Mood normal.        Behavior: Behavior normal.   Disposition: Return to SNF Follow-up with PCP and cardiology for electrolyte monitoring and respiratory status evaluation. Consider SGLT2 inhibitor  Discharge Instructions     Discharge patient   Complete by: As directed    Discharge disposition: 01-Home or Self Care   Discharge patient date: 11/02/2021      Allergies as of 11/02/2021       Reactions   Pantoprazole Nausea And Vomiting   Metformin And Related Diarrhea        Medication List     STOP taking these medications  ciclopirox 8 % solution Commonly known as: PENLAC   furosemide 20 MG tablet Commonly known as: LASIX   insulin NPH Human 100 UNIT/ML injection Commonly known as: NOVOLIN N   metoprolol tartrate 100 MG tablet Commonly known as: LOPRESSOR   potassium chloride 10 MEQ CR capsule Commonly known as: MICRO-K       TAKE these medications    acetaminophen 325 MG tablet Commonly known as: TYLENOL Take 650 mg by mouth every 4 (four) hours as needed.    amiodarone 200 MG tablet Commonly known as: PACERONE Take 200 mg by mouth daily.   bisacodyl 5 MG EC tablet Commonly known as: DULCOLAX Take 5 mg by mouth daily as needed for moderate constipation.   cetirizine 10 MG tablet Commonly known as: ZYRTEC Take 10 mg by mouth daily.   clopidogrel 75 MG tablet Commonly known as: PLAVIX Take 1 tablet (75 mg total) by mouth daily.   diclofenac Sodium 1 % Gel Commonly known as: VOLTAREN Apply 2 g topically 4 (four) times daily.   donepezil 10 MG tablet Commonly known as: ARICEPT Take 10 mg by mouth at bedtime.   Eliquis 5 MG Tabs tablet Generic drug: apixaban Take 5 mg by mouth 2 (two) times daily.   gabapentin 100 MG capsule Commonly known as: NEURONTIN Take 100 mg by mouth 2 (two) times daily.   gentamicin ointment 0.1 % Commonly known as: GARAMYCIN Apply topically 4 (four) times daily.   glipiZIDE 2.5 MG 24 hr tablet Commonly known as: GLUCOTROL XL Take 2.5 mg by mouth daily.   isosorbide mononitrate 30 MG 24 hr tablet Commonly known as: IMDUR Take 1 tablet (30 mg total) by mouth daily.   levothyroxine 88 MCG tablet Commonly known as: SYNTHROID Take 88 mcg by mouth daily.   lisinopril 5 MG tablet Commonly known as: ZESTRIL Take 1 tablet (5 mg total) by mouth daily.   loperamide 2 MG capsule Commonly known as: IMODIUM Take 4 mg by mouth as needed for diarrhea or loose stools.   metoprolol succinate 100 MG 24 hr tablet Commonly known as: TOPROL-XL Take 1 tablet (100 mg total) by mouth daily. Take with or immediately following a meal. Start taking on: November 03, 2021   pravastatin 20 MG tablet Commonly known as: PRAVACHOL Take 20 mg by mouth at bedtime.   torsemide 20 MG tablet Commonly known as: DEMADEX Take 1 tablet (20 mg total) by mouth daily. Start taking on: November 03, 2021   venlafaxine XR 75 MG 24 hr capsule Commonly known as: EFFEXOR-XR Take 75 mg by mouth daily with breakfast.          Signed: Richarda Osmond 11/02/2021, 11:51 AM

## 2021-11-02 NOTE — TOC Transition Note (Signed)
Transition of Care [facility]) - CM/SW Discharge Note   Patient Details  Name: Alison Richardson MRN: [id number] Date of Birth: 1933/04/14  Transition of Care [facility]) CM/SW Contact:  Beverly Sessions, RN Phone Number: 11/02/2021, 12:43 PM   Clinical Narrative:     Patient will DC SJ:[id number] Anticipated DC date:11/02/21 Family notified:Daughter Lattie Haw Transport TM:[id number] to pick up at 330   Notified by Everlene Balls that Josem Kaufmann was denied and offered peer to peer.  MD notified, however the decline peer to peer as patient is ambulating 300 feet.  Daughter aware.  And updated insurance in [facility] portal   Per MD patient ready for DC to . RN, patient, patient's family, and facility notified of DC. Discharge Summary sent to facility. RN given number for report. TOC signing off.  Isaias Cowman [facility] [phone]   Final next level of care: Skilled Nursing Facility Barriers to Discharge: Barriers Resolved   Patient Goals and CMS Choice        Discharge Placement              Patient [facility] bed at: [facility] Patient to be transferred to facility by: [facility] Name of family member notified: daughter Patient and family notified of of transfer: 11/02/21  Discharge Plan and Services                                     Social Determinants of Health ([place]) Interventions     Readmission Risk Interventions Readmission Risk Prevention Plan 11/02/2021  Transportation Screening Complete  PCP or Specialist Appt within 3-5 Days Complete  HRI or [place] Complete  Social Work Consult for [place] Planning/Counseling Complete  Palliative Care Screening Not Applicable  Medication Review Press photographer) Complete  Some recent data might be hidden

## 2021-11-02 NOTE — Progress Notes (Signed)
Physical Therapy Treatment Patient Details Name: Alison Richardson MRN: [id number] DOB: 1933-02-03 Today's Date: 11/02/2021   History of Present Illness Alison Richardson is a 87 y.o. female with medical history significant of sCHF (The left ventricular ejection fraction is 40-45% per recent caridiac cath report), CAD, hypertension, hyperlipidemia, diabetes mellitus, GERD, hypothyroidism, depression with anxiety, OSA not on CPAP, PVD, overactive bladder, Alzheimer's disease, cor pulmonale, atrial fibrillation on Eliquis, cerebral aneurysm, syncope, who presents with shortness of breath.    PT Comments    Patient received in recliner, states she hopes to leave today. Wants to walk. Patient is supervision for sit to stand from recliner, ambulates with rw 450 feet with min guard. Patient in afib during ambulation, asymptomatic. She has good tolerance with minimal sob. Patient will continue to benefit from skilled PT while here to improve strength and activity tolerance for return home independently.      Recommendations for follow up therapy are one component of a multi-disciplinary discharge planning process, led by the attending physician.  Recommendations may be updated based on patient status, additional functional criteria and insurance authorization.  Follow Up Recommendations  Skilled nursing-short term rehab (<3 hours/day)     Assistance Recommended at Discharge Intermittent Supervision/Assistance  Equipment Recommendations  None recommended by PT    Recommendations for Other Services       Precautions / Restrictions Precautions Precautions: Fall Restrictions Weight Bearing Restrictions: No     Mobility  Bed Mobility               General bed mobility comments: Pt seated in recliner beginning and end of session    Transfers Overall transfer level: Needs assistance Equipment used: Rolling walker (2 wheels) Transfers: Sit to/from Stand Sit to Stand: Supervision                 Ambulation/Gait Ambulation/Gait assistance: Min guard Gait Distance (Feet): 450 Feet Assistive device: Rolling walker (2 wheels) Gait Pattern/deviations: Step-through pattern Gait velocity: slightly decreased     General Gait Details: patient ambulates with good balance and use of AD. Reports mild sob after this distance, patient in Afib during walk, asymptomatic   Stairs             Wheelchair Mobility    Modified Rankin (Stroke Patients Only)       Balance Overall balance assessment: Modified Independent Sitting-balance support: Feet supported Sitting balance-Leahy Scale: Good     Standing balance support: Bilateral upper extremity supported;During functional activity Standing balance-Leahy Scale: Good                              Cognition Arousal/Alertness: Awake/alert Behavior During Therapy: WFL for tasks assessed/performed Overall Cognitive Status: Within Functional Limits for tasks assessed                                          Exercises      General Comments        Pertinent Vitals/Pain Pain Assessment: No/denies pain    Home Living                          Prior Function            PT Goals (current goals can now be found in the care plan section) Acute Rehab PT  Goals Patient Stated Goal: to go to rehab then home PT Goal Formulation: With patient Time For Goal Achievement: 11/15/21 Potential to Achieve Goals: Good Progress towards PT goals: Progressing toward goals    Frequency    Min 2X/week      PT Plan Current plan remains appropriate    Co-evaluation              AM-PAC PT "6 Clicks" Mobility   Outcome Measure  Help needed turning from your back to your side while in a flat bed without using bedrails?: None Help needed moving from lying on your back to sitting on the side of a flat bed without using bedrails?: A Little Help needed moving to and from a bed to  a chair (including a wheelchair)?: A Little Help needed standing up from a chair using your arms (e.g., wheelchair or bedside chair)?: A Little Help needed to walk in hospital room?: A Little Help needed climbing 3-5 steps with a railing? : A Little 6 Click Score: 19    End of Session Equipment Utilized During Treatment: Gait belt Activity Tolerance: Patient tolerated treatment well Patient left: in chair;with call bell/phone within reach Nurse Communication: Mobility status PT Visit Diagnosis: Muscle weakness (generalized) (M62.81)     Time: 4665-9935 PT Time Calculation (min) (ACUTE ONLY): 12 min  Charges:  $Gait Training: 8-22 mins                     Lizandra Zakrzewski, PT, GCS 11/02/21,11:31 AM

## 2021-11-02 NOTE — TOC Progression Note (Signed)
Transition of Care [facility]) - Progression Note    Patient Details  Name: Alison Richardson MRN: [id number] Date of Birth: 1933-07-13  Transition of Care [facility]) CM/SW Contact  Beverly Sessions, RN Phone Number: 11/02/2021, 10:13 AM  Clinical Narrative:      [name] [phone]      Expected Discharge Plan and Services           Expected Discharge Date: 11/02/21                                     Social Determinants of Health (SDOH) Interventions    Readmission Risk Interventions No flowsheet data found.

## 2021-11-10 ENCOUNTER — Ambulatory Visit: Payer: Medicare PPO | Admitting: Family

## 2021-11-14 ENCOUNTER — Other Ambulatory Visit: Payer: Self-pay | Admitting: Student in an Organized Health Care Education/Training Program

## 2021-11-19 NOTE — Progress Notes (Deleted)
Patient ID: Alison Richardson, female    DOB: [birth year], 87 y.o.   MRN: [id number]  HPI  Ms Tesch is a 87 y/o female with a history of skin cancer, DM, hyperlipidemia, HTN, thyroid disease, GERD, cerebral aneurysm, atrial flutter, mild pulmonary HTN, PVD, sleep apnea, late onset alzheimer's disease and chronic heart failure.   Echo report from 10/31/21 reviewed and showed an EF of 45-50% along with mild MR. Echo report from 10/07/20 reviewed and showed an EF of 60-65% along with mild MR but without regional wall motion abnormalities.   LHC done 10/12/21 and showed:   Ost RCA to Prox RCA lesion is 99% stenosed.   Dist RCA lesion is 65% stenosed.   RPDA lesion is 60% stenosed.   1st Diag lesion is 99% stenosed.   Ost Cx to Prox Cx lesion is 25% stenosed.   Ost LAD to Prox LAD lesion is 25% stenosed.   There is mild left ventricular systolic dysfunction.   LV end diastolic pressure is mildly elevated.   The left ventricular ejection fraction is 35-45% by visual estimate.  Admitted 10/31/21 due to shortness of breath due to acute on chronic HF. Initially given IV lasix with transition to oral diuretics. Able to be weaned off oxygen. Discharged after 2 days.   She presents today for a follow-up visit with a chief complaint of   Past Medical History:  Diagnosis Date   Arrhythmia    Atrial flutter ([place]) 01/2018   new onset    Basal cell carcinoma of back    Basal cell carcinoma of lip    Cerebral aneurysm    followed by [facility]   CHF (congestive heart failure) (HCC)    DDD (degenerative disc disease), lumbar    superior plate depression, L3 08/18/2014   Diabetes mellitus type II, controlled ([place])    Diverticulosis    Dysrhythmia    Paroxysmal Supraventricular Tachycardia   Dysthymia    depression   GERD (gastroesophageal reflux disease)    History of meniscal tear    Hyperlipidemia    Hypertension    Hypothyroidism    Late onset Alzheimer's disease with behavioral disturbance ([place])     Mild pulmonary hypertension ([place])    Overactive bladder    Peripheral vascular disease ([place])    Seasonal allergic rhinitis    Sleep apnea    Past Surgical History:  Procedure Laterality Date   APPENDECTOMY  1946   BASAL CELL CARCINOMA EXCISION     2006 and 2009 removed from back and lip   CARDIOVASCULAR STRESS TEST  2015   nuclear cardiac stress test negative for ischemia - Dr. Satira Sark   CATARACT EXTRACTION, BILATERAL  2006   COLONOSCOPY  2014   COLONOSCOPY N/A 08/19/2020   Procedure: COLONOSCOPY;  Surgeon: Lesly Rubenstein, MD;  Location: [facility] ENDOSCOPY;  Service: Endoscopy;  Laterality: N/A;   ECTOPIC PREGNANCY SURGERY  1957   ESOPHAGOGASTRODUODENOSCOPY N/A 08/19/2020   Procedure: ESOPHAGOGASTRODUODENOSCOPY (EGD);  Surgeon: Lesly Rubenstein, MD;  [facility] ENDOSCOPY;  Service: Endoscopy;  Laterality: N/A;   INJECTION KNEE Left 05/2015   LEFT HEART CATH AND CORONARY ANGIOGRAPHY N/A 10/12/2021   Procedure: LEFT HEART CATH AND CORONARY ANGIOGRAPHY;  Surgeon: Corey Skains, MD;  Location: [place] [facility];  Service: Cardiovascular;  Laterality: N/A;   REPLACEMENT TOTAL KNEE Left 09/06/2016   Dr. Barnet Pall   TONSILLECTOMY AND ADENOIDECTOMY  1953   TOTAL ABDOMINAL HYSTERECTOMY  1986   [facility]   Family  History  Problem Relation Age of Onset   Hypertension Mother    CAD Father    Diabetes Sister    Diabetes Paternal Uncle    Social History   Tobacco Use   Smoking status: Never   Smokeless tobacco: Never  Substance Use Topics   Alcohol use: Yes    Alcohol/week: 3.0 standard drinks    Types: 3 Shots of liquor per week    Comment: 3 x week    Allergies  Allergen Reactions   Pantoprazole Nausea And Vomiting   Metformin And Related Diarrhea     Review of Systems  Constitutional:  Positive for fatigue (easily). Negative for appetite change.  HENT:  Positive for congestion. Negative for postnasal drip and sore throat.   Eyes: Negative.   Respiratory:   Positive for cough, chest tightness (at times) and shortness of breath.   Cardiovascular:  Negative for chest pain, palpitations and leg swelling.  Gastrointestinal:  Negative for abdominal distention and abdominal pain.  Endocrine: Negative.   Genitourinary: Negative.   Musculoskeletal:  Positive for arthralgias (right shoulder). Negative for back pain.  Skin: Negative.   Allergic/Immunologic: Negative.   Neurological:  Positive for headaches (due to right shoulder pain). Negative for dizziness and light-headedness.       Off-balance  Hematological:  Negative for adenopathy. Does not bruise/bleed easily.  Psychiatric/Behavioral:  Positive for sleep disturbance (sleeping on 1 pillow). Negative for dysphoric mood. The patient is not nervous/anxious.       Physical Exam Vitals and nursing note reviewed.  Constitutional:      Appearance: She is well-developed.  HENT:     Head: Normocephalic and atraumatic.  Cardiovascular:     Rate and Rhythm: Regular rhythm. Tachycardia present.  Pulmonary:     Effort: Pulmonary effort is normal. No respiratory distress.     Breath sounds: No wheezing or rales.  Abdominal:     Palpations: Abdomen is soft.     Tenderness: There is no abdominal tenderness.  Musculoskeletal:     Right lower leg: No tenderness. No edema.     Left lower leg: No tenderness. No edema.  Skin:    General: Skin is warm and dry.  Neurological:     General: No focal deficit present.     Mental Status: She is alert and oriented to person, place, and time.  Psychiatric:        Mood and Affect: Mood normal.        Behavior: Behavior normal.   Assessment & Plan:  1: Chronic heart failure with preserved ejection fraction without structural changes- - NYHA class III - euvolemic today - weighing daily; reminded to call for an overnight weight gain of >2 pounds or a weekly weight gain of >5 pounds - weight 168.6 from last visit 1 year ago - not adding additional salt to  her food  - advised to drink between 50-64 ounces of fluids daily - taking PT  - BNP 10/31/21 was 314.3   2: HTN- - BP  - saw PCP Sabra Heck) 09/18/21; currently seeing PCP at facility - [facility] 11/02/21 reviewed and showed sodium 136, potassium 3.6, creatinine 0.71 and GFR >60  3: DM- - A1c 10/16/21 was 7.7% - nonfasting glucose in clinic today was    Patient did not bring her medications nor a list. Each medication was verbally reviewed with the patient and she was encouraged to bring the bottles to every visit to confirm accuracy of list.

## 2021-11-20 ENCOUNTER — Ambulatory Visit: Payer: Medicare PPO | Admitting: Family

## 2021-11-28 ENCOUNTER — Telehealth: Payer: Self-pay | Admitting: Family

## 2021-11-28 ENCOUNTER — Ambulatory Visit: Payer: Medicare PPO | Admitting: Family

## 2021-11-28 NOTE — Telephone Encounter (Signed)
Patient did not show for her [facility] appointment on 11/28/21. Will attempt to reschedule.

## 2021-11-28 NOTE — Progress Notes (Deleted)
Patient ID: Alison Richardson, female    DOB: [birth year], 87 y.o.   MRN: [id number]  HPI  Alison Richardson is a 87 y/o female with a history of skin cancer, DM, hyperlipidemia, HTN, thyroid disease, GERD, cerebral aneurysm, atrial flutter, mild pulmonary HTN, PVD, sleep apnea, late onset alzheimer's disease and chronic heart failure.   Echo report from 10/31/21 reviewed and showed an EF of 45-50% along with mild MR. Echo report from 10/07/20 reviewed and showed an EF of 60-65% along with mild MR but without regional wall motion abnormalities.   LHC done 10/12/21 and showed:   Ost RCA to Prox RCA lesion is 99% stenosed.   Dist RCA lesion is 65% stenosed.   RPDA lesion is 60% stenosed.   1st Diag lesion is 99% stenosed.   Ost Cx to Prox Cx lesion is 25% stenosed.   Ost LAD to Prox LAD lesion is 25% stenosed.   There is mild left ventricular systolic dysfunction.   LV end diastolic pressure is mildly elevated.   The left ventricular ejection fraction is 35-45% by visual estimate.  Admitted 10/31/21 due to shortness of breath due to acute on chronic HF. Initially given IV lasix with transition to oral diuretics. Able to be weaned off oxygen. Discharged after 2 days.   She presents today for a follow-up visit with a chief complaint of   Past Medical History:  Diagnosis Date   Arrhythmia    Atrial flutter ([place]) 01/2018   new onset    Basal cell carcinoma of back    Basal cell carcinoma of lip    Cerebral aneurysm    followed by [facility]   CHF (congestive heart failure) (HCC)    DDD (degenerative disc disease), lumbar    superior plate depression, L3 08/18/2014   Diabetes mellitus type II, controlled ([place])    Diverticulosis    Dysrhythmia    Paroxysmal Supraventricular Tachycardia   Dysthymia    depression   GERD (gastroesophageal reflux disease)    History of meniscal tear    Hyperlipidemia    Hypertension    Hypothyroidism    Late onset Alzheimer's disease with behavioral disturbance ([place])     Mild pulmonary hypertension ([place])    Overactive bladder    Peripheral vascular disease ([place])    Seasonal allergic rhinitis    Sleep apnea    Past Surgical History:  Procedure Laterality Date   APPENDECTOMY  1946   BASAL CELL CARCINOMA EXCISION     2006 and 2009 removed from back and lip   CARDIOVASCULAR STRESS TEST  2015   nuclear cardiac stress test negative for ischemia - Dr. Satira Sark   CATARACT EXTRACTION, BILATERAL  2006   COLONOSCOPY  2014   COLONOSCOPY N/A 08/19/2020   Procedure: COLONOSCOPY;  Surgeon: Lesly Rubenstein, MD;  Location: [facility] ENDOSCOPY;  Service: Endoscopy;  Laterality: N/A;   ECTOPIC PREGNANCY SURGERY  1957   ESOPHAGOGASTRODUODENOSCOPY N/A 08/19/2020   Procedure: ESOPHAGOGASTRODUODENOSCOPY (EGD);  Surgeon: Lesly Rubenstein, MD;  Location: [facility] ENDOSCOPY;  Service: Endoscopy;  Laterality: N/A;   INJECTION KNEE Left 05/2015   LEFT HEART CATH AND CORONARY ANGIOGRAPHY N/A 10/12/2021   Procedure: LEFT HEART CATH AND CORONARY ANGIOGRAPHY;  Surgeon: Corey Skains, MD;  Location: [place] [facility];  Service: Cardiovascular;  Laterality: N/A;   REPLACEMENT TOTAL KNEE Left 09/06/2016   Dr. Barnet Pall   TONSILLECTOMY AND ADENOIDECTOMY  1953   TOTAL ABDOMINAL HYSTERECTOMY  1986   [facility]   Family  History  Problem Relation Age of Onset   Hypertension Mother    CAD Father    Diabetes Sister    Diabetes Paternal Uncle    Social History   Tobacco Use   Smoking status: Never   Smokeless tobacco: Never  Substance Use Topics   Alcohol use: Yes    Alcohol/week: 3.0 standard drinks    Types: 3 Shots of liquor per week    Comment: 3 x week    Allergies  Allergen Reactions   Pantoprazole Nausea And Vomiting   Metformin And Related Diarrhea     Review of Systems  Constitutional:  Positive for fatigue (easily). Negative for appetite change.  HENT:  Positive for congestion. Negative for postnasal drip and sore throat.   Eyes: Negative.   Respiratory:   Positive for cough, chest tightness (at times) and shortness of breath.   Cardiovascular:  Negative for chest pain, palpitations and leg swelling.  Gastrointestinal:  Negative for abdominal distention and abdominal pain.  Endocrine: Negative.   Genitourinary: Negative.   Musculoskeletal:  Positive for arthralgias (right shoulder). Negative for back pain.  Skin: Negative.   Allergic/Immunologic: Negative.   Neurological:  Positive for headaches (due to right shoulder pain). Negative for dizziness and light-headedness.       Off-balance  Hematological:  Negative for adenopathy. Does not bruise/bleed easily.  Psychiatric/Behavioral:  Positive for sleep disturbance (sleeping on 1 pillow). Negative for dysphoric mood. The patient is not nervous/anxious.       Physical Exam Vitals and nursing note reviewed.  Constitutional:      Appearance: She is well-developed.  HENT:     Head: Normocephalic and atraumatic.  Cardiovascular:     Rate and Rhythm: Regular rhythm. Tachycardia present.  Pulmonary:     Effort: Pulmonary effort is normal. No respiratory distress.     Breath sounds: No wheezing or rales.  Abdominal:     Palpations: Abdomen is soft.     Tenderness: There is no abdominal tenderness.  Musculoskeletal:     Right lower leg: No tenderness. No edema.     Left lower leg: No tenderness. No edema.  Skin:    General: Skin is warm and dry.  Neurological:     General: No focal deficit present.     Mental Status: She is alert and oriented to person, place, and time.  Psychiatric:        Mood and Affect: Mood normal.        Behavior: Behavior normal.   Assessment & Plan:  1: Chronic heart failure with preserved ejection fraction without structural changes- - NYHA class III - euvolemic today - weighing daily; reminded to call for an overnight weight gain of >2 pounds or a weekly weight gain of >5 pounds - weight 168.6 from last visit 1 year ago - saw cardiology Corky Sox)  11/23/21 - not adding additional salt to her food  - advised to drink between 50-64 ounces of fluids daily - taking PT  - BNP 10/31/21 was 314.3   2: HTN- - BP  - saw PCP Sabra Heck) 09/18/21; currently seeing PCP at facility - [facility] 11/02/21 reviewed and showed sodium 136, potassium 3.6, creatinine 0.71 and GFR >60  3: DM- - A1c 10/16/21 was 7.7% - nonfasting glucose in clinic today was    Patient did not bring her medications nor a list. Each medication was verbally reviewed with the patient and she was encouraged to bring the bottles to every visit to confirm accuracy of  list.

## 2021-12-11 ENCOUNTER — Other Ambulatory Visit: Payer: Self-pay | Admitting: Student in an Organized Health Care Education/Training Program

## 2021-12-13 ENCOUNTER — Encounter: Payer: Medicare PPO | Attending: Internal Medicine | Admitting: *Deleted

## 2021-12-13 ENCOUNTER — Other Ambulatory Visit: Payer: Self-pay

## 2021-12-13 DIAGNOSIS — I214 Non-ST elevation (NSTEMI) myocardial infarction: Secondary | ICD-10-CM

## 2021-12-13 NOTE — Progress Notes (Signed)
Initial telephone orientation completed. Diagnosis can be found in [facility] 12/1. EP orientation scheduled for Thursday 1/19 at 2pm.

## 2022-01-10 NOTE — Progress Notes (Signed)
BP 112/62    Pulse (!) 48    Temp 98.2 F (36.8 C) (Oral)    Ht 5' 5.3" (1.659 m)    Wt 180 lb (81.6 kg)    SpO2 96%    BMI 29.68 kg/m    Subjective:    Patient ID: Alison Richardson, female    DOB: 11/10/1933, 88 y.o.   MRN: [id number]  HPI: Alison Richardson is a 88 y.o. female  Chief Complaint  Patient presents with   New Patient (Initial Visit)   Establish Care   Diabetes    Requested eye exam from [facility]   Congestive Heart Failure    Pt states she had a heart attack in October 2022. States since her heart attack, she has been very tired and fatigued. States she does see Cardiology.    Allergies    Pt states she has seasonal allergies and her nose runs constantly. States she sneezes a lot as well.     Patient presents to clinic to establish care with new PCP.  Introduced to Designer, jewellery role and practice setting.  All questions answered.  Discussed provider/patient relationship and expectations.  Patient reports a history of Patietn had a heart attack in October. She does not feel nearly as good as she did before.  She has a lot of SOB and fatigue since her NSTEMI.  She is about to go to Cardio Rehab at the end of the month. Type 2 diabetes, hypothyroidism, Depression, B12 deficiency, AFIB, HLD.   Patient states she does have some neuropathy and is on Gabapentin PRN at this point. She has a lot of lower back pain and is taking tylenol Q4h (55m).    Active Ambulatory Problems    Diagnosis Date Noted   Anxiety disorder 06/19/2018   Depression, major, single episode, mild (HB[place] 06/19/2018   Paroxysmal atrial fibrillation with rapid ventricular response ([place] 06/19/2018   Atrial flutter (HL[place] 06/19/2018   Pure hypercholesterolemia 06/19/2018   Vitamin B12 deficiency 06/19/2018   Insomnia 06/19/2018   Protein-calorie malnutrition ([place] 06/19/2018   Type 2 diabetes mellitus with diabetic neuropathy ([place] 06/19/2018   Knee pain 06/19/2018   Cognitive impairment,  mild, so stated 06/19/2018   Osteoarthritis of right knee 06/19/2018   Benign essential hypertension 06/19/2018   Hypothyroidism 06/19/2018   Overactive bladder 06/19/2018   Generalized muscle weakness 06/19/2018   Difficulty in walking, not elsewhere classified 06/19/2018   Cerebral aneurysm 04/29/2013   Diabetes mellitus type 2 in nonobese ([place] 01/21/2019   DM type 2 with diabetic mixed hyperlipidemia (HE[place] 09/08/2018   Dyslipidemia 01/21/2019   Major depression in remission ([place] 06/18/2018   Mixed hyperlipidemia 11/01/2015   PAD (peripheral artery disease) ([place] 01/06/2019   Peripheral neuropathy, idiopathic 09/08/2018   Preop testing 01/06/2019   Primary osteoarthritis involving multiple joints 06/18/2018   PSVT (paroxysmal supraventricular tachycardia) (HC[place] 01/21/2019   Tenosynovitis of foot 01/21/2019   Syncope 09/29/2019   Syncope and collapse 09/30/2019   DOE (dyspnea on exertion) 05/09/2020   Acute CHF (congestive heart failure) ([place] 10/07/2020   Elevated troponin 10/07/2020   CHF (congestive heart failure) ([facility] 10/07/2020   Leukocytosis 10/07/2020   Chronic diastolic CHF (congestive heart failure) ([place] 10/13/2020   Cor pulmonale, chronic (HH[place] 09/27/2019   Late onset Alzheimer's disease without behavioral disturbance ([place] 09/23/2019   Obstructive sleep apnea 07/09/2019   NSTEMI (non-ST elevated myocardial infarction) (HP[place] 10/10/2021   Acute on chronic systolic CHF (congestive heart failure) (HTw[place] 10/31/2021  HLD (hyperlipidemia) 10/31/2021   PAF (paroxysmal atrial fibrillation) ([place]) 10/31/2021   CAD (coronary artery disease) 10/31/2021   Acute respiratory failure with hypoxia ([place]) 10/31/2021   Acute on chronic diastolic CHF (congestive heart failure) ([place]) 10/31/2021   DDD (degenerative disc disease), lumbar    Resolved Ambulatory Problems    Diagnosis Date Noted   No Resolved Ambulatory Problems   Past Medical History:  Diagnosis Date   Arrhythmia     Basal cell carcinoma of back    Basal cell carcinoma of lip    Diabetes mellitus type II, controlled ([place])    Diverticulosis    Dysrhythmia    Dysthymia    GERD (gastroesophageal reflux disease)    History of meniscal tear    Hyperlipidemia    Hypertension    Late onset Alzheimer's disease with behavioral disturbance (HCC)    Mild pulmonary hypertension ([place])    Peripheral vascular disease ([place])    Seasonal allergic rhinitis    Sleep apnea    Past Surgical History:  Procedure Laterality Date   APPENDECTOMY  1946   BASAL CELL CARCINOMA EXCISION     2006 and 2009 removed from back and lip   CARDIOVASCULAR STRESS TEST  2015   nuclear cardiac stress test negative for ischemia - Dr. Satira Sark   CATARACT EXTRACTION, BILATERAL  2006   COLONOSCOPY  2014   COLONOSCOPY N/A 08/19/2020   Procedure: COLONOSCOPY;  Surgeon: Lesly Rubenstein, MD;  Location: [facility] ENDOSCOPY;  Service: Endoscopy;  Laterality: N/A;   ECTOPIC PREGNANCY SURGERY  1957   ESOPHAGOGASTRODUODENOSCOPY N/A 08/19/2020   Procedure: ESOPHAGOGASTRODUODENOSCOPY (EGD);  Surgeon: Lesly Rubenstein, MD;  Location: [facility] ENDOSCOPY;  Service: Endoscopy;  Laterality: N/A;   INJECTION KNEE Left 05/2015   LEFT HEART CATH AND CORONARY ANGIOGRAPHY N/A 10/12/2021   Procedure: LEFT HEART CATH AND CORONARY ANGIOGRAPHY;  Surgeon: Corey Skains, MD;  Location: [place] [facility];  Service: Cardiovascular;  Laterality: N/A;   REPLACEMENT TOTAL KNEE Left 09/06/2016   Dr. Barnet Pall   TONSILLECTOMY AND ADENOIDECTOMY  1953   TOTAL ABDOMINAL HYSTERECTOMY  1986   DU B   Family History  Problem Relation Age of Onset   Hypertension Mother    Heart disease Father    CAD Father    Heart disease Sister    Diabetes Sister    Diabetes Paternal Uncle      Review of Systems  Constitutional:  Positive for fatigue.  Eyes:  Negative for visual disturbance.  Respiratory:  Positive for shortness of breath. Negative for cough and chest  tightness.   Cardiovascular:  Negative for chest pain, palpitations and leg swelling.  Musculoskeletal:  Positive for back pain and neck pain.  Neurological:  Negative for dizziness and headaches.   Per HPI unless specifically indicated above     Objective:    BP 112/62    Pulse (!) 48    Temp 98.2 F (36.8 C) (Oral)    Ht 5' 5.3" (1.659 m)    Wt 180 lb (81.6 kg)    SpO2 96%    BMI 29.68 kg/m   Wt Readings from Last 3 Encounters:  01/11/22 180 lb (81.6 kg)  11/02/21 167 lb 14.4 oz (76.2 kg)  10/18/21 179 lb (81.2 kg)    Physical Exam Vitals and nursing note reviewed.  Constitutional:      General: She is not in acute distress.    Appearance: Normal appearance. She is normal weight. She is not ill-appearing, toxic-appearing  or diaphoretic.  HENT:     Head: Normocephalic.     Right Ear: External ear normal.     Left Ear: External ear normal.     Nose: Nose normal.     Mouth/Throat:     Mouth: Mucous membranes are moist.     Pharynx: Oropharynx is clear.  Eyes:     General:        Right eye: No discharge.        Left eye: No discharge.     Extraocular Movements: Extraocular movements intact.     Conjunctiva/sclera: Conjunctivae normal.     Pupils: Pupils are equal, round, and reactive to light.  Cardiovascular:     Rate and Rhythm: Normal rate and regular rhythm.     Heart sounds: No murmur heard. Pulmonary:     Effort: Pulmonary effort is normal. No respiratory distress.     Breath sounds: Normal breath sounds. No wheezing or rales.  Musculoskeletal:     Cervical back: Normal range of motion and neck supple. Crepitus present. No edema, erythema, signs of trauma, rigidity or torticollis. Pain with movement and muscular tenderness present. No spinous process tenderness. Normal range of motion.  Skin:    General: Skin is warm and dry.     Capillary Refill: Capillary refill takes less than 2 seconds.  Neurological:     General: No focal deficit present.     Mental Status:  She is alert and oriented to person, place, and time. Mental status is at baseline.  Psychiatric:        Mood and Affect: Mood normal.        Behavior: Behavior normal.        Thought Content: Thought content normal.        Judgment: Judgment normal.    Results for orders placed or performed during the hospital encounter of 10/31/21  Resp Panel by RT-PCR (Flu A&B, Covid) Nasopharyngeal Swab   Specimen: Nasopharyngeal Swab; Nasopharyngeal(NP) swabs in vial transport medium  Result Value Ref Range   SARS Coronavirus 2 by RT PCR NEGATIVE NEGATIVE   Influenza A by PCR NEGATIVE NEGATIVE   Influenza B by PCR NEGATIVE NEGATIVE  CBC with Differential  Result Value Ref Range   WBC 14.9 (H) 4.0 - 10.5 K/uL   RBC 4.03 3.87 - 5.11 MIL/uL   Hemoglobin 12.5 12.0 - 15.0 g/dL   HCT 38.0 36.0 - 46.0 %   MCV 94.3 80.0 - 100.0 fL   MCH 31.0 26.0 - 34.0 pg   MCHC 32.9 30.0 - 36.0 g/dL   RDW 14.6 11.5 - 15.5 %   Platelets 257 150 - 400 K/uL   nRBC 0.0 0.0 - 0.2 %   Neutrophils Relative % 81 %   Neutro Abs 12.0 (H) 1.7 - 7.7 K/uL   Lymphocytes Relative 10 %   Lymphs Abs 1.5 0.7 - 4.0 K/uL   Monocytes Relative 8 %   Monocytes Absolute 1.2 (H) 0.1 - 1.0 K/uL   Eosinophils Relative 0 %   Eosinophils Absolute 0.1 0.0 - 0.5 K/uL   Basophils Relative 0 %   Basophils Absolute 0.1 0.0 - 0.1 K/uL   Immature Granulocytes 1 %   Abs Immature Granulocytes 0.10 (H) 0.00 - 0.07 K/uL  Basic metabolic panel  Result Value Ref Range   Sodium 137 135 - 145 mmol/L   Potassium 4.3 3.5 - 5.1 mmol/L   Chloride 103 98 - 111 mmol/L   CO2 25 22 - 32  mmol/L   Glucose, Bld 186 (H) 70 - 99 mg/dL   BUN 11 8 - 23 mg/dL   Creatinine, Ser 0.67 0.44 - 1.00 mg/dL   Calcium 8.6 (L) 8.9 - 10.3 mg/dL   GFR, Estimated >60 >60 mL/min   Anion gap 9 5 - 15  Brain natriuretic peptide  Result Value Ref Range   B Natriuretic Peptide 314.3 (H) 0.0 - 100.0 pg/mL  Basic metabolic panel  Result Value Ref Range   Sodium 140 135 -  145 mmol/L   Potassium 3.3 (L) 3.5 - 5.1 mmol/L   Chloride 104 98 - 111 mmol/L   CO2 28 22 - 32 mmol/L   Glucose, Bld 155 (H) 70 - 99 mg/dL   BUN 15 8 - 23 mg/dL   Creatinine, Ser 0.77 0.44 - 1.00 mg/dL   Calcium 8.3 (L) 8.9 - 10.3 mg/dL   GFR, Estimated >60 >60 mL/min   Anion gap 8 5 - 15  Magnesium  Result Value Ref Range   Magnesium 2.3 1.7 - 2.4 mg/dL  Glucose, capillary  Result Value Ref Range   Glucose-Capillary 84 70 - 99 mg/dL  Glucose, capillary  Result Value Ref Range   Glucose-Capillary 147 (H) 70 - 99 mg/dL  Glucose, capillary  Result Value Ref Range   Glucose-Capillary 185 (H) 70 - 99 mg/dL  Glucose, capillary  Result Value Ref Range   Glucose-Capillary 125 (H) 70 - 99 mg/dL  Basic metabolic panel  Result Value Ref Range   Sodium 136 135 - 145 mmol/L   Potassium 3.6 3.5 - 5.1 mmol/L   Chloride 103 98 - 111 mmol/L   CO2 24 22 - 32 mmol/L   Glucose, Bld 212 (H) 70 - 99 mg/dL   BUN 18 8 - 23 mg/dL   Creatinine, Ser 0.71 0.44 - 1.00 mg/dL   Calcium 8.2 (L) 8.9 - 10.3 mg/dL   GFR, Estimated >60 >60 mL/min   Anion gap 9 5 - 15  Glucose, capillary  Result Value Ref Range   Glucose-Capillary 167 (H) 70 - 99 mg/dL  Glucose, capillary  Result Value Ref Range   Glucose-Capillary 155 (H) 70 - 99 mg/dL  Glucose, capillary  Result Value Ref Range   Glucose-Capillary 246 (H) 70 - 99 mg/dL  CBG monitoring, ED  Result Value Ref Range   Glucose-Capillary 126 (H) 70 - 99 mg/dL  CBG monitoring, ED  Result Value Ref Range   Glucose-Capillary 167 (H) 70 - 99 mg/dL  ECHOCARDIOGRAM COMPLETE  Result Value Ref Range   Weight 2,704 oz   Height 66 in   BP 157/82 mmHg   Ao pk vel 1.10 m/s   AV Area VTI 2.17 cm2   AR max vel 2.66 cm2   AV Mean grad 3.0 mmHg   AV Peak grad 4.8 mmHg   S' Lateral 3.00 cm   AV Area mean vel 2.52 cm2   Area-P 1/2 3.03 cm2   MV VTI 1.84 cm2  Troponin I (High Sensitivity)  Result Value Ref Range   Troponin I (High Sensitivity) 13 <18 ng/L       Assessment & Plan:   Problem List Items Addressed This Visit       Cardiovascular and Mediastinum   Paroxysmal atrial fibrillation with rapid ventricular response (HCC) - Primary    Chronic.  Controlled.  Continue with current medication regimen of Amiodarone 250m and Metoprolol 1048mdaily.  Patient is having fatigue and HR in office is 48.  May  need to decrease Metoprolol dose.  Patient is going to track HR over the next month and bring log to next visit. If still low, will reach out to Cardiology and decrease Metoprolol to 61m if Cardiologist is okay with it.  Labs ordered today.  Return to clinic in 1 months for reevaluation.  Call sooner if concerns arise.        Relevant Medications   lisinopril (ZESTRIL) 5 MG tablet   isosorbide mononitrate (IMDUR) 30 MG 24 hr tablet   Chronic diastolic CHF (congestive heart failure) (HRidgely    NSTEMI in October 2022.  EF of 40-45%.  Followed by cardiology. Will start Cardiac rehab in February.  Will have follow up with cardiology after to see if symptoms of SOB and Fatigue are improved.       Relevant Medications   lisinopril (ZESTRIL) 5 MG tablet   isosorbide mononitrate (IMDUR) 30 MG 24 hr tablet     Endocrine   Type 2 diabetes mellitus with diabetic neuropathy (HCC)    Chronic.  Controlled.  Continue with current medication regimen on Glipizide 2.573m  Take Gabapentin 10024mRN for neuropathy.  Labs ordered today.  Return to clinic in 1 months for reevaluation.  Call sooner if concerns arise.        Relevant Medications   lisinopril (ZESTRIL) 5 MG tablet   Other Relevant Orders   Comp Met (CMET)   HgB A1c   Hypothyroidism    Labs ordered today. Will make recommendations based on lab results.      Relevant Orders   TSH   T4, free   DM type 2 with diabetic mixed hyperlipidemia (HCC)    Chronic.  Controlled.  Continue with current medication regimen on Pravastatin 93m89mily.  Labs ordered today.  Return to clinic in 6 months  for reevaluation.  Call sooner if concerns arise.        Relevant Medications   lisinopril (ZESTRIL) 5 MG tablet   isosorbide mononitrate (IMDUR) 30 MG 24 hr tablet     Musculoskeletal and Integument   DDD (degenerative disc disease), lumbar    Ongoing pain. Was previously seeing Orthopedics and was told she should get an injection.  She has her heart attack and hasn't followed up with them. Taking tylenol Q4h but that isn't maintaining her pain. Recommend she followed up with Orthopedics. Will check CMP in office today.         Other   Depression, major, single episode, mild (HCC)    Chronic.  Controlled.  Continue with current medication regimen on Effexor 75mg32mly.  Labs ordered today.  Return to clinic in 6 months for reevaluation.  Call sooner if concerns arise.        Pure hypercholesterolemia    Labs ordered today. Will make recommendations based on lab results.       Relevant Medications   lisinopril (ZESTRIL) 5 MG tablet   isosorbide mononitrate (IMDUR) 30 MG 24 hr tablet   Other Relevant Orders   Lipid Profile   Vitamin B12 deficiency    Labs ordered today. Will make recommendations based on lab results.       Relevant Orders   B12   Other Visit Diagnoses     Neck pain       Tight neck muscles. Patient given exercises to help with symptoms. Will reevaluate in 1 month.        Follow up plan: Return in about 1 month (around 02/11/2022) for Heart rate  check.

## 2022-01-11 ENCOUNTER — Ambulatory Visit: Payer: Medicare PPO | Admitting: Nurse Practitioner

## 2022-01-11 ENCOUNTER — Other Ambulatory Visit: Payer: Self-pay | Admitting: Student in an Organized Health Care Education/Training Program

## 2022-01-11 ENCOUNTER — Encounter: Payer: Self-pay | Admitting: Nurse Practitioner

## 2022-01-11 ENCOUNTER — Other Ambulatory Visit: Payer: Self-pay

## 2022-01-11 VITALS — BP 112/62 | HR 48 | Temp 98.2°F | Ht 65.3 in | Wt 180.0 lb

## 2022-01-11 DIAGNOSIS — E7800 Pure hypercholesterolemia, unspecified: Secondary | ICD-10-CM

## 2022-01-11 DIAGNOSIS — I480 Paroxysmal atrial fibrillation: Secondary | ICD-10-CM | POA: Diagnosis not present

## 2022-01-11 DIAGNOSIS — E1140 Type 2 diabetes mellitus with diabetic neuropathy, unspecified: Secondary | ICD-10-CM | POA: Diagnosis not present

## 2022-01-11 DIAGNOSIS — E782 Mixed hyperlipidemia: Secondary | ICD-10-CM

## 2022-01-11 DIAGNOSIS — E039 Hypothyroidism, unspecified: Secondary | ICD-10-CM

## 2022-01-11 DIAGNOSIS — M5136 Other intervertebral disc degeneration, lumbar region: Secondary | ICD-10-CM

## 2022-01-11 DIAGNOSIS — F325 Major depressive disorder, single episode, in full remission: Secondary | ICD-10-CM

## 2022-01-11 DIAGNOSIS — I5032 Chronic diastolic (congestive) heart failure: Secondary | ICD-10-CM

## 2022-01-11 DIAGNOSIS — M51369 Other intervertebral disc degeneration, lumbar region without mention of lumbar back pain or lower extremity pain: Secondary | ICD-10-CM

## 2022-01-11 DIAGNOSIS — F0280 Dementia in other diseases classified elsewhere without behavioral disturbance: Secondary | ICD-10-CM

## 2022-01-11 DIAGNOSIS — E1169 Type 2 diabetes mellitus with other specified complication: Secondary | ICD-10-CM

## 2022-01-11 DIAGNOSIS — E538 Deficiency of other specified B group vitamins: Secondary | ICD-10-CM

## 2022-01-11 DIAGNOSIS — I471 Supraventricular tachycardia: Secondary | ICD-10-CM

## 2022-01-11 DIAGNOSIS — F320 Major depressive disorder, single episode, mild: Secondary | ICD-10-CM

## 2022-01-11 DIAGNOSIS — M542 Cervicalgia: Secondary | ICD-10-CM

## 2022-01-11 MED ORDER — MONTELUKAST SODIUM 10 MG PO TABS: 10.0000 mg | ORAL_TABLET | Freq: Every day | ORAL | 0 refills | Status: DC

## 2022-01-11 NOTE — Assessment & Plan Note (Signed)
Labs ordered today.  Will make recommendations based on lab results. ?

## 2022-01-11 NOTE — Assessment & Plan Note (Addendum)
NSTEMI in October 2022.  EF of 40-45%.  Followed by cardiology. Will start Cardiac rehab in February.  Will have follow up with cardiology after to see if symptoms of SOB and Fatigue are improved.

## 2022-01-11 NOTE — Assessment & Plan Note (Signed)
Chronic.  Controlled.  Continue with current medication regimen on Glipizide 2.5mg .  Take Gabapentin 100mg  PRN for neuropathy.  Labs ordered today.  Return to clinic in 1 months for reevaluation.  Call sooner if concerns arise.

## 2022-01-11 NOTE — Assessment & Plan Note (Signed)
Chronic.  Controlled.  Continue with current medication regimen on Effexor 75mg  daily.  Labs ordered today.  Return to clinic in 6 months for reevaluation.  Call sooner if concerns arise.

## 2022-01-11 NOTE — Assessment & Plan Note (Signed)
Chronic.  Controlled.  Continue with current medication regimen of Amiodarone 200mg  and Metoprolol 100mg  daily.  Patient is having fatigue and HR in office is 48.  May need to decrease Metoprolol dose.  Patient is going to track HR over the next month and bring log to next visit. If still low, will reach out to Cardiology and decrease Metoprolol to 50mg  if Cardiologist is okay with it.  Labs ordered today.  Return to clinic in 1 months for reevaluation.  Call sooner if concerns arise.

## 2022-01-11 NOTE — Assessment & Plan Note (Signed)
Chronic.  Controlled.  Continue with current medication regimen on Pravastatin 20mg  daily.  Labs ordered today.  Return to clinic in 6 months for reevaluation.  Call sooner if concerns arise.

## 2022-01-11 NOTE — Assessment & Plan Note (Signed)
Ongoing pain. Was previously seeing Orthopedics and was told she should get an injection.  She has her heart attack and hasn't followed up with them. Taking tylenol Q4h but that isn't maintaining her pain. Recommend she followed up with Orthopedics. Will check CMP in office today.

## 2022-01-12 ENCOUNTER — Telehealth: Payer: Self-pay | Admitting: *Deleted

## 2022-01-12 LAB — COMPREHENSIVE METABOLIC PANEL
ALT: 21 IU/L (ref 0–32)
AST: 22 IU/L (ref 0–40)
Albumin/Globulin Ratio: 1.6 (ref 1.2–2.2)
Albumin: 4.6 g/dL (ref 3.6–4.6)
Alkaline Phosphatase: 99 IU/L (ref 44–121)
BUN/Creatinine Ratio: 22 (ref 12–28)
BUN: 20 mg/dL (ref 8–27)
Bilirubin Total: 0.3 mg/dL (ref 0.0–1.2)
CO2: 22 mmol/L (ref 20–29)
Calcium: 9.7 mg/dL (ref 8.7–10.3)
Chloride: 104 mmol/L (ref 96–106)
Creatinine, Ser: 0.92 mg/dL (ref 0.57–1.00)
Globulin, Total: 2.8 g/dL (ref 1.5–4.5)
Glucose: 143 mg/dL — ABNORMAL HIGH (ref 70–99)
Potassium: 4 mmol/L (ref 3.5–5.2)
Sodium: 144 mmol/L (ref 134–144)
Total Protein: 7.4 g/dL (ref 6.0–8.5)
eGFR: 61 mL/min/{1.73_m2} (ref >59)

## 2022-01-12 LAB — LIPID PANEL
Chol/HDL Ratio: 4.3 ratio (ref 0.0–4.4)
Cholesterol, Total: 195 mg/dL (ref 100–199)
HDL: 45 mg/dL (ref >39)
LDL Chol Calc (NIH): 102 mg/dL — ABNORMAL HIGH (ref 0–99)
Triglycerides: 281 mg/dL — ABNORMAL HIGH (ref 0–149)
VLDL Cholesterol Cal: 48 mg/dL — ABNORMAL HIGH (ref 5–40)

## 2022-01-12 LAB — HEMOGLOBIN A1C
Est. average glucose Bld gHb Est-mCnc: 157 mg/dL
Hgb A1c MFr Bld: 7.1 % — ABNORMAL HIGH (ref 4.8–5.6)

## 2022-01-12 LAB — VITAMIN B12: Vitamin B-12: 429 pg/mL (ref 232–1245)

## 2022-01-12 LAB — TSH: TSH: 2.8 u[IU]/mL (ref 0.450–4.500)

## 2022-01-12 LAB — T4, FREE: Free T4: 1.98 ng/dL — ABNORMAL HIGH (ref 0.82–1.77)

## 2022-01-12 NOTE — Telephone Encounter (Signed)
Returned call and patient's daughter notified of lab results and recommendations:  Please let patient know that her lab work looks good.  One of her thyroid labs was slightly abnormal but the other one was normal. I don't want to make her hyperthyroid so I don't want to change her medication at this time. We will continue to monitor this and change as needed.  Her A1c remains well controlled at 7.1. Keep up the good work. Cholesterol is elevated.  Recommend decreasing processed foods and refined sugar intake. Otherwise her lab work looks good.  We will continue to monitor at future visits.

## 2022-01-12 NOTE — Progress Notes (Signed)
Please let patient know that her lab work looks good.  One of her thyroid labs was slightly abnormal but the other one was normal. I don't want to make her hyperthyroid so I don't want to change her medication at this time. We will continue to monitor this and change as needed.  Her A1c remains well controlled at 7.1. Keep up the good work. Cholesterol is elevated.  Recommend decreasing processed foods and refined sugar intake. Otherwise her lab work looks good.  We will continue to monitor at future visits.

## 2022-01-22 ENCOUNTER — Other Ambulatory Visit: Payer: Self-pay

## 2022-01-22 VITALS — Ht 66.0 in | Wt 181.3 lb

## 2022-01-22 DIAGNOSIS — R531 Weakness: Secondary | ICD-10-CM | POA: Diagnosis not present

## 2022-01-22 DIAGNOSIS — I214 Non-ST elevation (NSTEMI) myocardial infarction: Secondary | ICD-10-CM | POA: Insufficient documentation

## 2022-01-22 DIAGNOSIS — A4151 Sepsis due to Escherichia coli [E. coli]: Secondary | ICD-10-CM | POA: Diagnosis not present

## 2022-01-22 NOTE — Progress Notes (Signed)
Cardiac Individual Treatment Plan  Patient Details  Name: Alison Richardson MRN: [id number] Date of Birth: [birth year] Referring Provider:   Flowsheet Row Cardiac Rehab from 01/22/2022 in [facility] Cardiac and Pulmonary Rehab  Referring Provider Corky Sox       Initial Encounter Date:  Flowsheet Row Cardiac Rehab from 01/22/2022 in [facility] Cardiac and Pulmonary Rehab  Date 01/22/22       Visit Diagnosis: NSTEMI (non-ST elevated myocardial infarction) [facility])  Patient's Home Medications on Admission:  Current Outpatient Medications:    amiodarone (PACERONE) 200 MG tablet, Take 200 mg by mouth daily. , Disp: , Rfl:    cetirizine (ZYRTEC) 10 MG tablet, Take 10 mg by mouth daily., Disp: , Rfl:    clopidogrel (PLAVIX) 75 MG tablet, Take 75 mg by mouth daily., Disp: , Rfl:    donepezil (ARICEPT) 10 MG tablet, Take 10 mg by mouth at bedtime., Disp: , Rfl:    ELIQUIS 5 MG TABS tablet, Take 5 mg by mouth 2 (two) times daily. , Disp: , Rfl:    gabapentin (NEURONTIN) 100 MG capsule, Take 100 mg by mouth 2 (two) times daily., Disp: , Rfl:    glipiZIDE (GLUCOTROL XL) 2.5 MG 24 hr tablet, Take 2.5 mg by mouth daily., Disp: , Rfl:    isosorbide mononitrate (IMDUR) 30 MG 24 hr tablet, Take 30 mg by mouth daily., Disp: , Rfl:    levothyroxine (SYNTHROID) 88 MCG tablet, Take 88 mcg by mouth daily. , Disp: , Rfl:    lisinopril (ZESTRIL) 5 MG tablet, Take 5 mg by mouth daily., Disp: , Rfl:    metoprolol succinate (TOPROL-XL) 100 MG 24 hr tablet, TAKE ONE TABLET (100 MG) BY MOUTH EVERY DAY (TAKE WITH OR IMMEDIATELY AFTER A MEAL), Disp: 90 tablet, Rfl: 1   montelukast (SINGULAIR) 10 MG tablet, Take 1 tablet (10 mg total) by mouth at bedtime., Disp: 30 tablet, Rfl: 0   pravastatin (PRAVACHOL) 20 MG tablet, Take 20 mg by mouth at bedtime., Disp: , Rfl:    torsemide (DEMADEX) 20 MG tablet, Take 1 tablet (20 mg total) by mouth daily., Disp: 30 tablet, Rfl: 0   venlafaxine XR (EFFEXOR-XR) 75 MG 24 hr capsule, Take 75 mg by  mouth daily with breakfast. , Disp: , Rfl:   Past Medical History: Past Medical History:  Diagnosis Date   Arrhythmia    Atrial flutter ([place]) 01/2018   new onset    Basal cell carcinoma of back    Basal cell carcinoma of lip    Cerebral aneurysm    followed by [facility]   CHF (congestive heart failure) (HCC)    DDD (degenerative disc disease), lumbar    superior plate depression, L3 08/18/2014   Diabetes mellitus type II, controlled ([place])    Diverticulosis    Dysrhythmia    Paroxysmal Supraventricular Tachycardia   Dysthymia    depression   GERD (gastroesophageal reflux disease)    History of meniscal tear    Hyperlipidemia    Hypertension    Hypothyroidism    Late onset Alzheimer's disease with behavioral disturbance ([place])    Mild pulmonary hypertension (HCC)    Overactive bladder    Peripheral vascular disease (HCC)    Seasonal allergic rhinitis    Sleep apnea     Tobacco Use: Social History   Tobacco Use  Smoking Status Never  Smokeless Tobacco Never    Labs: Recent Review Flowsheet Data     Labs for ITP Cardiac and Pulmonary Rehab Latest Ref Rng &  Units 09/29/2019 05/10/2020 10/07/2020 10/16/2021 01/11/2022   Cholestrol 100 - 199 mg/dL - 166 - - 195   LDLCALC 0 - 99 mg/dL - 98 - - 102(H)   HDL >39 mg/dL - 51 - - 45   Trlycerides 0 - 149 mg/dL - 84 - - 281(H)   Hemoglobin A1c 4.8 - 5.6 % 7.4(H) 7.1(H) 7.1(H) 7.7(H) 7.1(H)        Exercise Target Goals: Exercise Program Goal: Individual exercise prescription set using results from initial 6 min walk test and THRR while considering  patients activity barriers and safety.   Exercise Prescription Goal: Initial exercise prescription builds to 30-45 minutes a day of aerobic activity, 2-3 days per week.  Home exercise guidelines will be given to patient during program as part of exercise prescription that the participant will acknowledge.   Education: Aerobic Exercise: - Group verbal and visual presentation on the  components of exercise prescription. Introduces F.I.T.T principle from ACSM for exercise prescriptions.  Reviews F.I.T.T. principles of aerobic exercise including progression. Written material given at graduation.   Education: Resistance Exercise: - Group verbal and visual presentation on the components of exercise prescription. Introduces F.I.T.T principle from ACSM for exercise prescriptions  Reviews F.I.T.T. principles of resistance exercise including progression. Written material given at graduation.    Education: Exercise & Equipment Safety: - Individual verbal instruction and demonstration of equipment use and safety with use of the equipment. Flowsheet Row Cardiac Rehab from 01/22/2022 in [facility] Cardiac and Pulmonary Rehab  Date 01/22/22  Educator AS  Instruction Review Code 1- Verbalizes Understanding       Education: Exercise Physiology & General Exercise Guidelines: - Group verbal and written instruction with models to review the exercise physiology of the cardiovascular system and associated critical values. Provides general exercise guidelines with specific guidelines to those with heart or lung disease.  Flowsheet Row Cardiac Rehab from 01/22/2022 in [facility] Cardiac and Pulmonary Rehab  Education need identified 01/22/22       Education: Flexibility, Balance, Mind/Body Relaxation: - Group verbal and visual presentation with interactive activity on the components of exercise prescription. Introduces F.I.T.T principle from ACSM for exercise prescriptions. Reviews F.I.T.T. principles of flexibility and balance exercise training including progression. Also discusses the mind body connection.  Reviews various relaxation techniques to help reduce and manage stress (i.e. Deep breathing, progressive muscle relaxation, and visualization). Balance handout provided to take home. Written material given at graduation.   Activity Barriers & Risk Stratification:  Activity Barriers & Cardiac Risk  Stratification - 12/13/21 1403       Activity Barriers & Cardiac Risk Stratification   Activity Barriers Balance Concerns;Joint Problems;Arthritis;Left Knee Replacement;Muscular Weakness    Cardiac Risk Stratification High             6 Minute Walk:  6 Minute Walk     Row Name 01/22/22 1658         6 Minute Walk   Phase Initial     Distance 1000 feet     Walk Time 6 minutes     # of Rest Breaks 0     MPH 1.9     METS 1.26     RPE 13     Perceived Dyspnea  2     VO2 Peak 4.4     Symptoms No     Resting HR 56 bpm     Resting BP 98/52     Resting Oxygen Saturation  93 %     Exercise Oxygen  Saturation  during 6 min walk 94 %     Max Ex. HR 88 bpm     Max Ex. BP 130/60     2 Minute Post BP 116/60              Oxygen Initial Assessment:   Oxygen Re-Evaluation:   Oxygen Discharge (Final Oxygen Re-Evaluation):   Initial Exercise Prescription:  Initial Exercise Prescription - 01/22/22 1600       Date of Initial Exercise RX and Referring Provider   Date 01/22/22    Referring Provider Corky Sox      Oxygen   Maintain Oxygen Saturation 88% or higher      Treadmill   MPH 1.5    Grade 0    Minutes 15    METs 2.15      Recumbant Bike   Level 1    RPM 60    Minutes 15    METs 1.3      NuStep   Level 1    SPM 80    Minutes 15    METs 1.3      T5 Nustep   Level 1    SPM 80    Minutes 15    METs 1.3      Track   Laps 20    Minutes 15      Prescription Details   Frequency (times per week) 3    Duration Progress to 30 minutes of continuous aerobic without signs/symptoms of physical distress      Intensity   THRR 40-80% of Max Heartrate 87-118    Ratings of Perceived Exertion 11-13    Perceived Dyspnea 0-4      Resistance Training   Training Prescription Yes    Weight 3 lb    Reps 10-15             Perform Capillary Blood Glucose checks as needed.  Exercise Prescription Changes:   Exercise Prescription Changes     Row Name  01/22/22 1700             Response to Exercise   Blood Pressure (Admit) 98/52       Blood Pressure (Exercise) 130/60       Blood Pressure (Exit) 116/60       Heart Rate (Admit) 56 bpm       Heart Rate (Exercise) 88 bpm       Heart Rate (Exit) 66 bpm       Oxygen Saturation (Admit) 93 %       Oxygen Saturation (Exercise) 94 %       Rating of Perceived Exertion (Exercise) 13       Perceived Dyspnea (Exercise) 2       Symptoms none                Exercise Comments:   Exercise Goals and Review:   Exercise Goals     Row Name 01/22/22 1704             Exercise Goals   Increase Physical Activity Yes       Intervention Provide advice, education, support and counseling about physical activity/exercise needs.;Develop an individualized exercise prescription for aerobic and resistive training based on initial evaluation findings, risk stratification, comorbidities and participant's personal goals.       Expected Outcomes Short Term: Attend rehab on a regular basis to increase amount of physical activity.;Long Term: Add in home exercise to make exercise part of routine and to  increase amount of physical activity.;Long Term: Exercising regularly at least 3-5 days a week.       Increase Strength and Stamina Yes       Intervention Provide advice, education, support and counseling about physical activity/exercise needs.;Develop an individualized exercise prescription for aerobic and resistive training based on initial evaluation findings, risk stratification, comorbidities and participant's personal goals.       Expected Outcomes Short Term: Increase workloads from initial exercise prescription for resistance, speed, and METs.;Short Term: Perform resistance training exercises routinely during rehab and add in resistance training at home;Long Term: Improve cardiorespiratory fitness, muscular endurance and strength as measured by increased METs and functional capacity (6MWT)       Able to  understand and use rate of perceived exertion (RPE) scale Yes       Intervention Provide education and explanation on how to use RPE scale       Expected Outcomes Short Term: Able to use RPE daily in rehab to express subjective intensity level;Long Term:  Able to use RPE to guide intensity level when exercising independently       Able to understand and use Dyspnea scale Yes       Intervention Provide education and explanation on how to use Dyspnea scale       Expected Outcomes Short Term: Able to use Dyspnea scale daily in rehab to express subjective sense of shortness of breath during exertion;Long Term: Able to use Dyspnea scale to guide intensity level when exercising independently       Knowledge and understanding of Target Heart Rate Range (THRR) Yes       Intervention Provide education and explanation of THRR including how the numbers were predicted and where they are located for reference       Expected Outcomes Short Term: Able to state/look up THRR;Short Term: Able to use daily as guideline for intensity in rehab;Long Term: Able to use THRR to govern intensity when exercising independently       Able to check pulse independently Yes       Intervention Provide education and demonstration on how to check pulse in carotid and radial arteries.;Review the importance of being able to check your own pulse for safety during independent exercise       Expected Outcomes Short Term: Able to explain why pulse checking is important during independent exercise;Long Term: Able to check pulse independently and accurately       Understanding of Exercise Prescription Yes       Intervention Provide education, explanation, and written materials on patient's individual exercise prescription       Expected Outcomes Short Term: Able to explain program exercise prescription;Long Term: Able to explain home exercise prescription to exercise independently                Exercise Goals Re-Evaluation  :   Discharge Exercise Prescription (Final Exercise Prescription Changes):  Exercise Prescription Changes - 01/22/22 1700       Response to Exercise   Blood Pressure (Admit) 98/52    Blood Pressure (Exercise) 130/60    Blood Pressure (Exit) 116/60    Heart Rate (Admit) 56 bpm    Heart Rate (Exercise) 88 bpm    Heart Rate (Exit) 66 bpm    Oxygen Saturation (Admit) 93 %    Oxygen Saturation (Exercise) 94 %    Rating of Perceived Exertion (Exercise) 13    Perceived Dyspnea (Exercise) 2    Symptoms none  Nutrition:  Target Goals: Understanding of nutrition guidelines, daily intake of sodium 1500mg , cholesterol 200mg , calories 30% from fat and 7% or less from saturated fats, daily to have 5 or more servings of fruits and vegetables.  Education: All About Nutrition: -Group instruction provided by verbal, written material, interactive activities, discussions, models, and posters to present general guidelines for heart healthy nutrition including fat, fiber, MyPlate, the role of sodium in heart healthy nutrition, utilization of the nutrition label, and utilization of this knowledge for meal planning. Follow up email sent as well. Written material given at graduation. Flowsheet Row Cardiac Rehab from 01/22/2022 in [facility] Cardiac and Pulmonary Rehab  Education need identified 01/22/22       Biometrics:  Pre Biometrics - 01/22/22 1706       Pre Biometrics   Height 5\' 6"  (1.676 m)    Weight 181 lb 4.8 oz (82.2 kg)    BMI (Calculated) 29.28    Single Leg Stand 3.5 seconds              Nutrition Therapy Plan and Nutrition Goals:   Nutrition Assessments:  MEDIFICTS Score Key: ?70 Need to make dietary changes  40-70 Heart Healthy Diet ? 40 Therapeutic Level Cholesterol Diet  Flowsheet Row Cardiac Rehab from 01/22/2022 in [facility] Cardiac and Pulmonary Rehab  Picture Your Plate Total Score on Admission 75      Picture Your Plate Scores: <96 Unhealthy dietary  pattern with much room for improvement. 41-50 Dietary pattern unlikely to meet recommendations for good health and room for improvement. 51-60 More healthful dietary pattern, with some room for improvement.  >60 Healthy dietary pattern, although there may be some specific behaviors that could be improved.    Nutrition Goals Re-Evaluation:   Nutrition Goals Discharge (Final Nutrition Goals Re-Evaluation):   Psychosocial: Target Goals: Acknowledge presence or absence of significant depression and/or stress, maximize coping skills, provide positive support system. Participant is able to verbalize types and ability to use techniques and skills needed for reducing stress and depression.   Education: Stress, Anxiety, and Depression - Group verbal and visual presentation to define topics covered.  Reviews how body is impacted by stress, anxiety, and depression.  Also discusses healthy ways to reduce stress and to treat/manage anxiety and depression.  Written material given at graduation.   Education: Sleep Hygiene -Provides group verbal and written instruction about how sleep can affect your health.  Define sleep hygiene, discuss sleep cycles and impact of sleep habits. Review good sleep hygiene tips.    Initial Review & Psychosocial Screening:  Initial Psych Review & Screening - 12/13/21 1406       Initial Review   Current issues with Current Sleep Concerns;History of Depression      Family Dynamics   Good Support System? Yes   daughter, neighbors at [facility]     Barriers   Psychosocial barriers to participate in program There are no identifiable barriers or psychosocial needs.;The patient should benefit from training in stress management and relaxation.      Screening Interventions   Interventions Encouraged to exercise;Provide feedback about the scores to participant;To provide support and resources with identified psychosocial needs    Expected Outcomes Short Term goal: Utilizing  psychosocial counselor, staff and physician to assist with identification of specific Stressors or current issues interfering with healing process. Setting desired goal for each stressor or current issue identified.;Short Term goal: Identification and review with participant of any Quality of Life or Depression concerns found by scoring  the questionnaire.;Long Term goal: The participant improves quality of Life and PHQ9 Scores as seen by post scores and/or verbalization of changes;Long Term Goal: Stressors or current issues are controlled or eliminated.             Quality of Life Scores:   Quality of Life - 01/22/22 1708       Quality of Life   Select Quality of Life      Quality of Life Scores   Health/Function Pre 20.36 %    Socioeconomic Pre 24.5 %    Psych/Spiritual Pre 20.07 %    Family Pre 25.13 %    GLOBAL Pre 21.62 %            Scores of 19 and below usually indicate a poorer quality of life in these areas.  A difference of  2-3 points is a clinically meaningful difference.  A difference of 2-3 points in the total score of the Quality of Life Index has been associated with significant improvement in overall quality of life, self-image, physical symptoms, and general health in studies assessing change in quality of life.  PHQ-9: Recent Review Flowsheet Data     Depression screen [facility] 2/9 01/22/2022 01/11/2022 05/12/2021   Decreased Interest 1 2 0   Down, Depressed, Hopeless 0 1 0   PHQ - 2 Score 1 3 0   Altered sleeping 3 2 -   Tired, decreased energy 3 3 -   Change in appetite 2 2 -   Feeling bad or failure about yourself  0 0 -   Trouble concentrating 0 0 -   Moving slowly or fidgety/restless 0 0 -   Suicidal thoughts 0 0 -   PHQ-9 Score 9 10 -   Difficult doing work/chores Somewhat difficult  Somewhat difficult -      Interpretation of Total Score  Total Score Depression Severity:  1-4 = Minimal depression, 5-9 = Mild depression, 10-14 = Moderate depression,  15-19 = Moderately severe depression, 20-27 = Severe depression   Psychosocial Evaluation and Intervention:  Psychosocial Evaluation - 12/13/21 1419       Psychosocial Evaluation & Interventions   Comments Ms. Naef reports doing okay after her NSTEMI. She has noticed increased weakness and balance concerns. She relies more on her walker than before. She states her diabetes and blood pressure are being managed well. She does have a history of depression and reports her medication is managing her symptoms. Her biggest concern is sleep, in that she doesn't sleep well and needs to wear her CPAP more but it dries her nostrils out too much. She takes medication to sleep that helps sometimes. She lives in [place] and enjoys her neighbors/friends and is thankful her daughter lives close by to help her when needed. She wants to maintain her independence as long as possible so she is ready to start the program to gain strength and stamina    Expected Outcomes Short: attend cardiac rehab for education and exercise. Long: develop and maintain positive self care habits.    Continue Psychosocial Services  Follow up required by staff             Psychosocial Re-Evaluation:   Psychosocial Discharge (Final Psychosocial Re-Evaluation):   Vocational Rehabilitation: Provide vocational rehab assistance to qualifying candidates.   Vocational Rehab Evaluation & Intervention:  Vocational Rehab - 12/13/21 1406       Initial Vocational Rehab Evaluation & Intervention   Assessment shows need for Vocational Rehabilitation No  Education: Education Goals: Education classes will be provided on a variety of topics geared toward better understanding of heart health and risk factor modification. Participant will state understanding/return demonstration of topics presented as noted by education test scores.  Learning Barriers/Preferences:  Learning Barriers/Preferences - 12/13/21 1406        Learning Barriers/Preferences   Learning Barriers None    Learning Preferences Individual Instruction             General Cardiac Education Topics:  AED/CPR: - Group verbal and written instruction with the use of models to demonstrate the basic use of the AED with the basic ABC's of resuscitation.   Anatomy and Cardiac Procedures: - Group verbal and visual presentation and models provide information about basic cardiac anatomy and function. Reviews the testing methods done to diagnose heart disease and the outcomes of the test results. Describes the treatment choices: Medical Management, Angioplasty, or Coronary Bypass Surgery for treating various heart conditions including Myocardial Infarction, Angina, Valve Disease, and Cardiac Arrhythmias.  Written material given at graduation.   Medication Safety: - Group verbal and visual instruction to review commonly prescribed medications for heart and lung disease. Reviews the medication, class of the drug, and side effects. Includes the steps to properly store meds and maintain the prescription regimen.  Written material given at graduation.   Intimacy: - Group verbal instruction through game format to discuss how heart and lung disease can affect sexual intimacy. Written material given at graduation..   Know Your Numbers and Heart Failure: - Group verbal and visual instruction to discuss disease risk factors for cardiac and pulmonary disease and treatment options.  Reviews associated critical values for Overweight/Obesity, Hypertension, Cholesterol, and Diabetes.  Discusses basics of heart failure: signs/symptoms and treatments.  Introduces Heart Failure Zone chart for action plan for heart failure.  Written material given at graduation.   Infection Prevention: - Provides verbal and written material to individual with discussion of infection control including proper hand washing and proper equipment cleaning during exercise  session. Flowsheet Row Cardiac Rehab from 01/22/2022 in [facility] Cardiac and Pulmonary Rehab  Date 01/22/22  Educator AS  Instruction Review Code 1- Verbalizes Understanding       Falls Prevention: - Provides verbal and written material to individual with discussion of falls prevention and safety. Flowsheet Row Cardiac Rehab from 01/22/2022 in [facility] Cardiac and Pulmonary Rehab  Date 01/22/22  Educator AS  Instruction Review Code 1- Verbalizes Understanding       Other: -Provides group and verbal instruction on various topics (see comments)   Knowledge Questionnaire Score:   Core Components/Risk Factors/Patient Goals at Admission:  Personal Goals and Risk Factors at Admission - 12/13/21 1405       Core Components/Risk Factors/Patient Goals on Admission   Diabetes Yes    Intervention Provide education about signs/symptoms and action to take for hypo/hyperglycemia.;Provide education about proper nutrition, including hydration, and aerobic/resistive exercise prescription along with prescribed medications to achieve blood glucose in normal ranges: Fasting glucose 65-99 mg/dL    Expected Outcomes Short Term: Participant verbalizes understanding of the signs/symptoms and immediate care of hyper/hypoglycemia, proper foot care and importance of medication, aerobic/resistive exercise and nutrition plan for blood glucose control.;Long Term: Attainment of HbA1C < 7%.    Hypertension Yes    Intervention Provide education on lifestyle modifcations including regular physical activity/exercise, weight management, moderate sodium restriction and increased consumption of fresh fruit, vegetables, and low fat dairy, alcohol moderation, and smoking cessation.;Monitor prescription use compliance.  Expected Outcomes Short Term: Continued assessment and intervention until BP is < 140/4mm HG in hypertensive participants. < 130/9mm HG in hypertensive participants with diabetes, heart failure or chronic kidney  disease.;Long Term: Maintenance of blood pressure at goal levels.    Lipids Yes    Intervention Provide education and support for participant on nutrition & aerobic/resistive exercise along with prescribed medications to achieve LDL 70mg , HDL >40mg .    Expected Outcomes Short Term: Participant states understanding of desired cholesterol values and is compliant with medications prescribed. Participant is following exercise prescription and nutrition guidelines.;Long Term: Cholesterol controlled with medications as prescribed, with individualized exercise RX and with personalized nutrition plan. Value goals: LDL < 70mg , HDL > 40 mg.             Education:Diabetes - Individual verbal and written instruction to review signs/symptoms of diabetes, desired ranges of glucose level fasting, after meals and with exercise. Acknowledge that pre and post exercise glucose checks will be done for 3 sessions at entry of program. Flowsheet Row Cardiac Rehab from 12/13/2021 in [facility]  Date 12/13/21  Educator [facility]  Instruction Review Code 1- Verbalizes Understanding       Core Components/Risk Factors/Patient Goals Review:    Core Components/Risk Factors/Patient Goals at Discharge (Final Review):    ITP Comments:  ITP Comments     [place] Name 12/13/21 1414           ITP Comments Initial telephone orientation completed. Diagnosis can be found in [facility] 12/1. EP orientation scheduled for Thursday 1/19 at 2pm.                Comments: initial ITP

## 2022-01-22 NOTE — Patient Instructions (Signed)
Patient Instructions  Patient Details  Name: Alison Richardson MRN: [id number] Date of Birth: 06-25-1933 Referring Provider:  Corey Skains, MD  Below are your personal goals for exercise, nutrition, and risk factors. Our goal is to help you stay on track towards obtaining and maintaining these goals. We will be discussing your progress on these goals with you throughout the program.  Initial Exercise Prescription:  Initial Exercise Prescription - 01/22/22 1600       Date of Initial Exercise RX and Referring Provider   Date 01/22/22    Referring Provider Corky Sox      Oxygen   Maintain Oxygen Saturation 88% or higher      Treadmill   MPH 1.5    Grade 0    Minutes 15    METs 2.15      Recumbant Bike   Level 1    RPM 60    Minutes 15    METs 1.3      NuStep   Level 1    SPM 80    Minutes 15    METs 1.3      T5 Nustep   Level 1    SPM 80    Minutes 15    METs 1.3      Track   Laps 20    Minutes 15      Prescription Details   Frequency (times per week) 3    Duration Progress to 30 minutes of continuous aerobic without signs/symptoms of physical distress      Intensity   THRR 40-80% of Max Heartrate 87-118    Ratings of Perceived Exertion 11-13    Perceived Dyspnea 0-4      Resistance Training   Training Prescription Yes    Weight 3 lb    Reps 10-15             Exercise Goals: Frequency: Be able to perform aerobic exercise two to three times per week in program working toward 2-5 days per week of home exercise.  Intensity: Work with a perceived exertion of 11 (fairly light) - 15 (hard) while following your exercise prescription.  We will make changes to your prescription with you as you progress through the program.   Duration: Be able to do 30 to 45 minutes of continuous aerobic exercise in addition to a 5 minute warm-up and a 5 minute cool-down routine.   Nutrition Goals: Your personal nutrition goals will be established when you do your nutrition  analysis with the dietician.  The following are general nutrition guidelines to follow: Cholesterol < 200mg /day Sodium < 1500mg /day Fiber: Women over 50 yrs - 21 grams per day  Personal Goals:  Personal Goals and Risk Factors at Admission - 12/13/21 1405       Core Components/Risk Factors/Patient Goals on Admission   Diabetes Yes    Intervention Provide education about signs/symptoms and action to take for hypo/hyperglycemia.;Provide education about proper nutrition, including hydration, and aerobic/resistive exercise prescription along with prescribed medications to achieve blood glucose in normal ranges: Fasting glucose 65-99 mg/dL    Expected Outcomes Short Term: Participant verbalizes understanding of the signs/symptoms and immediate care of hyper/hypoglycemia, proper foot care and importance of medication, aerobic/resistive exercise and nutrition plan for blood glucose control.;Long Term: Attainment of HbA1C < 7%.    Hypertension Yes    Intervention Provide education on lifestyle modifcations including regular physical activity/exercise, weight management, moderate sodium restriction and increased consumption of fresh fruit, vegetables, and low fat dairy,  alcohol moderation, and smoking cessation.;Monitor prescription use compliance.    Expected Outcomes Short Term: Continued assessment and intervention until BP is < 140/79mm HG in hypertensive participants. < 130/83mm HG in hypertensive participants with diabetes, heart failure or chronic kidney disease.;Long Term: Maintenance of blood pressure at goal levels.    Lipids Yes    Intervention Provide education and support for participant on nutrition & aerobic/resistive exercise along with prescribed medications to achieve LDL 70mg , HDL >40mg .    Expected Outcomes Short Term: Participant states understanding of desired cholesterol values and is compliant with medications prescribed. Participant is following exercise prescription and nutrition  guidelines.;Long Term: Cholesterol controlled with medications as prescribed, with individualized exercise RX and with personalized nutrition plan. Value goals: LDL < 70mg , HDL > 40 mg.             Tobacco Use Initial Evaluation: Social History   Tobacco Use  Smoking Status Never  Smokeless Tobacco Never    Exercise Goals and Review:  Exercise Goals     Row Name 01/22/22 1704             Exercise Goals   Increase Physical Activity Yes       Intervention Provide advice, education, support and counseling about physical activity/exercise needs.;Develop an individualized exercise prescription for aerobic and resistive training based on initial evaluation findings, risk stratification, comorbidities and participant's personal goals.       Expected Outcomes Short Term: Attend rehab on a regular basis to increase amount of physical activity.;Long Term: Add in home exercise to make exercise part of routine and to increase amount of physical activity.;Long Term: Exercising regularly at least 3-5 days a week.       Increase Strength and Stamina Yes       Intervention Provide advice, education, support and counseling about physical activity/exercise needs.;Develop an individualized exercise prescription for aerobic and resistive training based on initial evaluation findings, risk stratification, comorbidities and participant's personal goals.       Expected Outcomes Short Term: Increase workloads from initial exercise prescription for resistance, speed, and METs.;Short Term: Perform resistance training exercises routinely during rehab and add in resistance training at home;Long Term: Improve cardiorespiratory fitness, muscular endurance and strength as measured by increased METs and functional capacity (6MWT)       Able to understand and use rate of perceived exertion (RPE) scale Yes       Intervention Provide education and explanation on how to use RPE scale       Expected Outcomes Short  Term: Able to use RPE daily in rehab to express subjective intensity level;Long Term:  Able to use RPE to guide intensity level when exercising independently       Able to understand and use Dyspnea scale Yes       Intervention Provide education and explanation on how to use Dyspnea scale       Expected Outcomes Short Term: Able to use Dyspnea scale daily in rehab to express subjective sense of shortness of breath during exertion;Long Term: Able to use Dyspnea scale to guide intensity level when exercising independently       Knowledge and understanding of Target Heart Rate Range (THRR) Yes       Intervention Provide education and explanation of THRR including how the numbers were predicted and where they are located for reference       Expected Outcomes Short Term: Able to state/look up THRR;Short Term: Able to use daily as guideline for intensity  in rehab;Long Term: Able to use THRR to govern intensity when exercising independently       Able to check pulse independently Yes       Intervention Provide education and demonstration on how to check pulse in carotid and radial arteries.;Review the importance of being able to check your own pulse for safety during independent exercise       Expected Outcomes Short Term: Able to explain why pulse checking is important during independent exercise;Long Term: Able to check pulse independently and accurately       Understanding of Exercise Prescription Yes       Intervention Provide education, explanation, and written materials on patient's individual exercise prescription       Expected Outcomes Short Term: Able to explain program exercise prescription;Long Term: Able to explain home exercise prescription to exercise independently                Copy of goals given to participant.

## 2022-01-25 ENCOUNTER — Emergency Department (HOSPITAL_COMMUNITY): Payer: Medicare PPO

## 2022-01-25 ENCOUNTER — Emergency Department: Payer: Medicare PPO

## 2022-01-25 ENCOUNTER — Inpatient Hospital Stay
Admission: EM | Admit: 2022-01-25 | Discharge: 2022-01-30 | DRG: 871 | Disposition: A | Discharge status: Nursing Home (Includes ICF) | Payer: Medicare PPO | Attending: Obstetrics and Gynecology | Admitting: Obstetrics and Gynecology

## 2022-01-25 ENCOUNTER — Inpatient Hospital Stay: Payer: Medicare PPO

## 2022-01-25 ENCOUNTER — Other Ambulatory Visit: Payer: Self-pay

## 2022-01-25 ENCOUNTER — Encounter: Payer: Self-pay | Admitting: Emergency Medicine

## 2022-01-25 DIAGNOSIS — E1165 Type 2 diabetes mellitus with hyperglycemia: Secondary | ICD-10-CM | POA: Diagnosis present

## 2022-01-25 DIAGNOSIS — Z7984 Long term (current) use of oral hypoglycemic drugs: Secondary | ICD-10-CM

## 2022-01-25 DIAGNOSIS — A4151 Sepsis due to Escherichia coli [E. coli]: Principal | ICD-10-CM | POA: Diagnosis present

## 2022-01-25 DIAGNOSIS — Z20822 Contact with and (suspected) exposure to covid-19: Secondary | ICD-10-CM | POA: Diagnosis present

## 2022-01-25 DIAGNOSIS — Z888 Allergy status to other drugs, medicaments and biological substances status: Secondary | ICD-10-CM

## 2022-01-25 DIAGNOSIS — G4733 Obstructive sleep apnea (adult) (pediatric): Secondary | ICD-10-CM | POA: Diagnosis present

## 2022-01-25 DIAGNOSIS — Z79899 Other long term (current) drug therapy: Secondary | ICD-10-CM

## 2022-01-25 DIAGNOSIS — E039 Hypothyroidism, unspecified: Secondary | ICD-10-CM | POA: Diagnosis present

## 2022-01-25 DIAGNOSIS — I272 Pulmonary hypertension, unspecified: Secondary | ICD-10-CM | POA: Diagnosis present

## 2022-01-25 DIAGNOSIS — N179 Acute kidney failure, unspecified: Secondary | ICD-10-CM | POA: Diagnosis present

## 2022-01-25 DIAGNOSIS — R112 Nausea with vomiting, unspecified: Secondary | ICD-10-CM

## 2022-01-25 DIAGNOSIS — E1140 Type 2 diabetes mellitus with diabetic neuropathy, unspecified: Secondary | ICD-10-CM | POA: Diagnosis present

## 2022-01-25 DIAGNOSIS — F0280 Dementia in other diseases classified elsewhere without behavioral disturbance: Secondary | ICD-10-CM | POA: Diagnosis present

## 2022-01-25 DIAGNOSIS — R52 Pain, unspecified: Secondary | ICD-10-CM

## 2022-01-25 DIAGNOSIS — E1159 Type 2 diabetes mellitus with other circulatory complications: Secondary | ICD-10-CM | POA: Diagnosis present

## 2022-01-25 DIAGNOSIS — Z7901 Long term (current) use of anticoagulants: Secondary | ICD-10-CM

## 2022-01-25 DIAGNOSIS — E1151 Type 2 diabetes mellitus with diabetic peripheral angiopathy without gangrene: Secondary | ICD-10-CM | POA: Diagnosis present

## 2022-01-25 DIAGNOSIS — I10 Essential (primary) hypertension: Secondary | ICD-10-CM | POA: Diagnosis present

## 2022-01-25 DIAGNOSIS — I152 Hypertension secondary to endocrine disorders: Secondary | ICD-10-CM | POA: Diagnosis present

## 2022-01-25 DIAGNOSIS — A419 Sepsis, unspecified organism: Secondary | ICD-10-CM

## 2022-01-25 DIAGNOSIS — R531 Weakness: Secondary | ICD-10-CM

## 2022-01-25 DIAGNOSIS — I252 Old myocardial infarction: Secondary | ICD-10-CM

## 2022-01-25 DIAGNOSIS — E785 Hyperlipidemia, unspecified: Secondary | ICD-10-CM | POA: Diagnosis present

## 2022-01-25 DIAGNOSIS — E876 Hypokalemia: Secondary | ICD-10-CM | POA: Diagnosis present

## 2022-01-25 DIAGNOSIS — E538 Deficiency of other specified B group vitamins: Secondary | ICD-10-CM | POA: Diagnosis present

## 2022-01-25 DIAGNOSIS — Z85828 Personal history of other malignant neoplasm of skin: Secondary | ICD-10-CM | POA: Diagnosis not present

## 2022-01-25 DIAGNOSIS — M25511 Pain in right shoulder: Secondary | ICD-10-CM | POA: Diagnosis present

## 2022-01-25 DIAGNOSIS — I480 Paroxysmal atrial fibrillation: Secondary | ICD-10-CM | POA: Diagnosis present

## 2022-01-25 DIAGNOSIS — I959 Hypotension, unspecified: Secondary | ICD-10-CM

## 2022-01-25 DIAGNOSIS — Z7902 Long term (current) use of antithrombotics/antiplatelets: Secondary | ICD-10-CM

## 2022-01-25 DIAGNOSIS — I2510 Atherosclerotic heart disease of native coronary artery without angina pectoris: Secondary | ICD-10-CM | POA: Diagnosis present

## 2022-01-25 DIAGNOSIS — G301 Alzheimer's disease with late onset: Secondary | ICD-10-CM | POA: Diagnosis present

## 2022-01-25 DIAGNOSIS — I110 Hypertensive heart disease with heart failure: Secondary | ICD-10-CM | POA: Diagnosis present

## 2022-01-25 DIAGNOSIS — Z8249 Family history of ischemic heart disease and other diseases of the circulatory system: Secondary | ICD-10-CM

## 2022-01-25 DIAGNOSIS — E872 Acidosis, unspecified: Secondary | ICD-10-CM | POA: Diagnosis present

## 2022-01-25 DIAGNOSIS — I248 Other forms of acute ischemic heart disease: Secondary | ICD-10-CM | POA: Diagnosis present

## 2022-01-25 DIAGNOSIS — I5042 Chronic combined systolic (congestive) and diastolic (congestive) heart failure: Secondary | ICD-10-CM | POA: Diagnosis present

## 2022-01-25 DIAGNOSIS — N3000 Acute cystitis without hematuria: Secondary | ICD-10-CM | POA: Diagnosis present

## 2022-01-25 DIAGNOSIS — Z66 Do not resuscitate: Secondary | ICD-10-CM | POA: Diagnosis present

## 2022-01-25 DIAGNOSIS — K219 Gastro-esophageal reflux disease without esophagitis: Secondary | ICD-10-CM | POA: Diagnosis present

## 2022-01-25 DIAGNOSIS — W19XXXA Unspecified fall, initial encounter: Secondary | ICD-10-CM | POA: Diagnosis present

## 2022-01-25 DIAGNOSIS — I4892 Unspecified atrial flutter: Secondary | ICD-10-CM | POA: Diagnosis present

## 2022-01-25 DIAGNOSIS — Z96652 Presence of left artificial knee joint: Secondary | ICD-10-CM | POA: Diagnosis present

## 2022-01-25 DIAGNOSIS — J9601 Acute respiratory failure with hypoxia: Secondary | ICD-10-CM | POA: Diagnosis present

## 2022-01-25 DIAGNOSIS — Z833 Family history of diabetes mellitus: Secondary | ICD-10-CM

## 2022-01-25 DIAGNOSIS — Z79890 Hormone replacement therapy (postmenopausal): Secondary | ICD-10-CM

## 2022-01-25 LAB — COMPREHENSIVE METABOLIC PANEL
ALT: 20 U/L (ref 0–44)
AST: 37 U/L (ref 15–41)
Albumin: 3.9 g/dL (ref 3.5–5.0)
Alkaline Phosphatase: 82 U/L (ref 38–126)
Anion gap: 13 (ref 5–15)
BUN: 21 mg/dL (ref 8–23)
CO2: 27 mmol/L (ref 22–32)
Calcium: 8.9 mg/dL (ref 8.9–10.3)
Chloride: 98 mmol/L (ref 98–111)
Creatinine, Ser: 1.41 mg/dL — ABNORMAL HIGH (ref 0.44–1.00)
GFR, Estimated: 36 mL/min — ABNORMAL LOW (ref >60)
Glucose, Bld: 256 mg/dL — ABNORMAL HIGH (ref 70–99)
Potassium: 3.2 mmol/L — ABNORMAL LOW (ref 3.5–5.1)
Sodium: 138 mmol/L (ref 135–145)
Total Bilirubin: 0.9 mg/dL (ref 0.3–1.2)
Total Protein: 7.8 g/dL (ref 6.5–8.1)

## 2022-01-25 LAB — BLOOD CULTURE ID PANEL (REFLEXED) - BCID2

## 2022-01-25 LAB — CBC
HCT: 41.7 % (ref 36.0–46.0)
Hemoglobin: 13.7 g/dL (ref 12.0–15.0)
MCH: 29 pg (ref 26.0–34.0)
MCHC: 32.9 g/dL (ref 30.0–36.0)
MCV: 88.3 fL (ref 80.0–100.0)
Platelets: 277 10*3/uL (ref 150–400)
RBC: 4.72 MIL/uL (ref 3.87–5.11)
RDW: 13.2 % (ref 11.5–15.5)
WBC: 27.7 10*3/uL — ABNORMAL HIGH (ref 4.0–10.5)
nRBC: 0 % (ref 0.0–0.2)

## 2022-01-25 LAB — URINALYSIS, COMPLETE (UACMP) WITH MICROSCOPIC
Bilirubin Urine: NEGATIVE
Glucose, UA: 250 mg/dL — AB
Ketones, ur: 15 mg/dL — AB
Nitrite: POSITIVE — AB
Protein, ur: >300 mg/dL — AB
Specific Gravity, Urine: 1.015 (ref 1.005–1.030)
WBC, UA: >50 WBC/hpf (ref 0–5)
pH: 5 (ref 5.0–8.0)

## 2022-01-25 LAB — RESP PANEL BY RT-PCR (FLU A&B, COVID) ARPGX2
Influenza A by PCR: NEGATIVE
Influenza B by PCR: NEGATIVE
SARS Coronavirus 2 by RT PCR: NEGATIVE

## 2022-01-25 LAB — PROTIME-INR
INR: 1.5 — ABNORMAL HIGH (ref 0.8–1.2)
Prothrombin Time: 18.2 seconds — ABNORMAL HIGH (ref 11.4–15.2)

## 2022-01-25 LAB — LACTIC ACID, PLASMA
Lactic Acid, Venous: 3.1 mmol/L (ref 0.5–1.9)
Lactic Acid, Venous: 3.1 mmol/L (ref 0.5–1.9)

## 2022-01-25 LAB — TROPONIN I (HIGH SENSITIVITY)
Troponin I (High Sensitivity): 166 ng/L (ref <18)
Troponin I (High Sensitivity): 129 ng/L (ref <18)

## 2022-01-25 LAB — CBG MONITORING, ED: Glucose-Capillary: 251 mg/dL — ABNORMAL HIGH (ref 70–99)

## 2022-01-25 LAB — CK: Total CK: 370 U/L — ABNORMAL HIGH (ref 38–234)

## 2022-01-25 LAB — MAGNESIUM: Magnesium: 1.7 mg/dL (ref 1.7–2.4)

## 2022-01-25 LAB — APTT: aPTT: 31 seconds (ref 24–36)

## 2022-01-25 MED ORDER — AMIODARONE HCL IN DEXTROSE 360-4.14 MG/200ML-% IV SOLN
30.0000 mg/h | INTRAVENOUS | Status: DC
  Administered 2022-01-25 – 2022-01-27 (×3): 30 mg/h via INTRAVENOUS
  Filled 2022-01-25 (×3): qty 200

## 2022-01-25 MED ORDER — AMIODARONE HCL IN DEXTROSE 360-4.14 MG/200ML-% IV SOLN
60.0000 mg/h | INTRAVENOUS | Status: AC
  Administered 2022-01-25 (×2): 60 mg/h via INTRAVENOUS
  Filled 2022-01-25 (×2): qty 200

## 2022-01-25 MED ORDER — GABAPENTIN 100 MG PO CAPS
100.0000 mg | ORAL_CAPSULE | Freq: Two times a day (BID) | ORAL | Status: DC
  Administered 2022-01-25 – 2022-01-30 (×10): 100 mg via ORAL
  Filled 2022-01-25 (×10): qty 1

## 2022-01-25 MED ORDER — SODIUM CHLORIDE 0.9 % IV SOLN
1.0000 g | INTRAVENOUS | Status: DC
  Administered 2022-01-25: 1 g via INTRAVENOUS
  Filled 2022-01-25: qty 10

## 2022-01-25 MED ORDER — VENLAFAXINE HCL ER 75 MG PO CP24
75.0000 mg | ORAL_CAPSULE | Freq: Every day | ORAL | Status: DC
  Administered 2022-01-26 – 2022-01-30 (×5): 75 mg via ORAL
  Filled 2022-01-25 (×6): qty 1

## 2022-01-25 MED ORDER — SODIUM CHLORIDE 0.9 % IV SOLN
2.0000 g | INTRAVENOUS | Status: DC
  Administered 2022-01-26 – 2022-01-27 (×2): 2 g via INTRAVENOUS
  Filled 2022-01-25: qty 20
  Filled 2022-01-25: qty 2

## 2022-01-25 MED ORDER — VANCOMYCIN HCL 1750 MG/350ML IV SOLN
1750.0000 mg | Freq: Once | INTRAVENOUS | Status: AC
  Administered 2022-01-25: 1750 mg via INTRAVENOUS
  Filled 2022-01-25: qty 350

## 2022-01-25 MED ORDER — FUROSEMIDE 10 MG/ML IJ SOLN
40.0000 mg | Freq: Two times a day (BID) | INTRAMUSCULAR | Status: AC
  Administered 2022-01-26: 40 mg via INTRAVENOUS
  Filled 2022-01-25: qty 4

## 2022-01-25 MED ORDER — METOPROLOL TARTRATE 5 MG/5ML IV SOLN
5.0000 mg | Freq: Once | INTRAVENOUS | Status: AC
  Administered 2022-01-25: 5 mg via INTRAVENOUS
  Filled 2022-01-25: qty 5

## 2022-01-25 MED ORDER — AMIODARONE HCL 200 MG PO TABS
200.0000 mg | ORAL_TABLET | Freq: Every day | ORAL | Status: DC
  Administered 2022-01-25: 200 mg via ORAL
  Filled 2022-01-25: qty 1

## 2022-01-25 MED ORDER — METRONIDAZOLE 500 MG/100ML IV SOLN
500.0000 mg | Freq: Once | INTRAVENOUS | Status: AC
  Administered 2022-01-25: 500 mg via INTRAVENOUS
  Filled 2022-01-25: qty 100

## 2022-01-25 MED ORDER — INSULIN ASPART 100 UNIT/ML IJ SOLN
0.0000 [IU] | Freq: Three times a day (TID) | INTRAMUSCULAR | Status: DC
  Administered 2022-01-26: 5 [IU] via SUBCUTANEOUS
  Administered 2022-01-26: 2 [IU] via SUBCUTANEOUS
  Administered 2022-01-27: 3 [IU] via SUBCUTANEOUS
  Administered 2022-01-27: 5 [IU] via SUBCUTANEOUS
  Administered 2022-01-27: 3 [IU] via SUBCUTANEOUS
  Filled 2022-01-25 (×5): qty 1

## 2022-01-25 MED ORDER — AMIODARONE IV BOLUS ONLY 150 MG/100ML
INTRAVENOUS | Status: AC
  Filled 2022-01-25: qty 100

## 2022-01-25 MED ORDER — ASPIRIN 81 MG PO CHEW
324.0000 mg | CHEWABLE_TABLET | Freq: Once | ORAL | Status: AC
  Administered 2022-01-25: 324 mg via ORAL
  Filled 2022-01-25: qty 4

## 2022-01-25 MED ORDER — DONEPEZIL HCL 5 MG PO TABS
10.0000 mg | ORAL_TABLET | Freq: Every day | ORAL | Status: DC
  Administered 2022-01-25 – 2022-01-29 (×5): 10 mg via ORAL
  Filled 2022-01-25 (×8): qty 2

## 2022-01-25 MED ORDER — LACTATED RINGERS IV BOLUS (SEPSIS)
1500.0000 mL | Freq: Once | INTRAVENOUS | Status: AC
  Administered 2022-01-25: 1500 mL via INTRAVENOUS

## 2022-01-25 MED ORDER — LEVOTHYROXINE SODIUM 88 MCG PO TABS
88.0000 ug | ORAL_TABLET | Freq: Every day | ORAL | Status: DC
  Administered 2022-01-26 – 2022-01-30 (×5): 88 ug via ORAL
  Filled 2022-01-25 (×6): qty 1

## 2022-01-25 MED ORDER — PRAVASTATIN SODIUM 20 MG PO TABS
20.0000 mg | ORAL_TABLET | Freq: Every day | ORAL | Status: DC
  Administered 2022-01-26 – 2022-01-29 (×4): 20 mg via ORAL
  Filled 2022-01-25 (×5): qty 1

## 2022-01-25 MED ORDER — SODIUM CHLORIDE 0.9 % IV BOLUS
1000.0000 mL | Freq: Once | INTRAVENOUS | Status: AC
  Administered 2022-01-25: 1000 mL via INTRAVENOUS

## 2022-01-25 MED ORDER — APIXABAN 5 MG PO TABS
5.0000 mg | ORAL_TABLET | Freq: Two times a day (BID) | ORAL | Status: DC
  Administered 2022-01-25 – 2022-01-30 (×10): 5 mg via ORAL
  Filled 2022-01-25 (×11): qty 1

## 2022-01-25 MED ORDER — POTASSIUM CHLORIDE CRYS ER 20 MEQ PO TBCR
80.0000 meq | EXTENDED_RELEASE_TABLET | Freq: Once | ORAL | Status: AC
  Administered 2022-01-25: 80 meq via ORAL
  Filled 2022-01-25: qty 4

## 2022-01-25 MED ORDER — FUROSEMIDE 10 MG/ML IJ SOLN
80.0000 mg | Freq: Once | INTRAMUSCULAR | Status: AC
  Administered 2022-01-25: 80 mg via INTRAVENOUS
  Filled 2022-01-25: qty 8

## 2022-01-25 MED ORDER — SODIUM CHLORIDE 0.9 % IV SOLN
2.0000 g | Freq: Once | INTRAVENOUS | Status: AC
  Administered 2022-01-25: 2 g via INTRAVENOUS
  Filled 2022-01-25: qty 2

## 2022-01-25 MED ORDER — VANCOMYCIN HCL IN DEXTROSE 1-5 GM/200ML-% IV SOLN: 1000.0000 mg | Freq: Once | INTRAVENOUS | Status: DC

## 2022-01-25 MED ORDER — AMIODARONE LOAD VIA INFUSION
150.0000 mg | Freq: Once | INTRAVENOUS | Status: AC
  Administered 2022-01-25: 150 mg via INTRAVENOUS
  Filled 2022-01-25: qty 83.34

## 2022-01-25 MED ORDER — ONDANSETRON HCL 4 MG/2ML IJ SOLN
4.0000 mg | Freq: Once | INTRAMUSCULAR | Status: AC
  Administered 2022-01-25: 4 mg via INTRAVENOUS
  Filled 2022-01-25: qty 2

## 2022-01-25 MED ORDER — ENOXAPARIN SODIUM 30 MG/0.3ML IJ SOSY: 30.0000 mg | PREFILLED_SYRINGE | INTRAMUSCULAR | Status: DC

## 2022-01-25 MED ORDER — SODIUM CHLORIDE 0.9 % IV BOLUS
500.0000 mL | Freq: Once | INTRAVENOUS | Status: AC
  Administered 2022-01-25: 500 mL via INTRAVENOUS

## 2022-01-25 MED ORDER — METOPROLOL TARTRATE 5 MG/5ML IV SOLN
5.0000 mg | Freq: Once | INTRAVENOUS | Status: AC
Start: 2022-01-25 — End: 2022-01-25
  Administered 2022-01-25: 5 mg via INTRAVENOUS
  Filled 2022-01-25: qty 5

## 2022-01-25 MED ORDER — CLOPIDOGREL BISULFATE 75 MG PO TABS
75.0000 mg | ORAL_TABLET | Freq: Every day | ORAL | Status: DC
  Administered 2022-01-25 – 2022-01-30 (×6): 75 mg via ORAL
  Filled 2022-01-25 (×6): qty 1

## 2022-01-25 MED ORDER — FENTANYL CITRATE PF 50 MCG/ML IJ SOSY
25.0000 ug | PREFILLED_SYRINGE | Freq: Once | INTRAMUSCULAR | Status: AC
  Administered 2022-01-25: 25 ug via INTRAVENOUS
  Filled 2022-01-25: qty 1

## 2022-01-25 MED ORDER — LACTATED RINGERS IV SOLN: INTRAVENOUS | Status: DC

## 2022-01-25 NOTE — ED Notes (Signed)
Critical Result: Trop 166  Paduchowski, MD aware

## 2022-01-25 NOTE — Sepsis Progress Note (Signed)
Notified bedside nurse of need to draw lactic acid and Blood Cultures.. 

## 2022-01-25 NOTE — Consult Note (Signed)
PHARMACY -  BRIEF ANTIBIOTIC NOTE   Pharmacy has received consult(s) for vancomycin and cefepime from an ED provider.  The patient's profile has been reviewed for ht/wt/allergies/indication/available labs.    One time order(s) placed for cefepime 2 g and vancomycin 1750 mg IV   Further antibiotics/pharmacy consults should be ordered by admitting physician if indicated.                       Thank you, Darnelle Bos 01/25/2022  8:58 AM

## 2022-01-25 NOTE — Sepsis Progress Note (Signed)
ELink tracking the Code Sepsis. 

## 2022-01-25 NOTE — ED Notes (Signed)
Pt removed from Bi-PAP at pt's request. Pt no longer with labored breathing. Pt provided with sips of water. Will continue to monitor.

## 2022-01-25 NOTE — ED Triage Notes (Signed)
Patient to ED via [facility] from home (independent living at [facility]) for a syncopal episode. Patient did have 1 episode of vomiting prior to this episode. Patient denies hitting head but is on blood thinners. Patient complaining of right shoulder pain. Patient Aox4.

## 2022-01-25 NOTE — ED Provider Notes (Signed)
[facility] Provider Note    Event Date/Time   First MD Initiated Contact with Patient 01/25/22 0720     (approximate)  History   Chief Complaint: Loss of Consciousness  HPI  Alison Richardson is a 88 y.o. female with a past medical history.  According to the patient for the past month states last night she fell while trying to get into bed hurting her right shoulder/upper arm.  Does not believe she hit her head but she is not sure.  Patient states she was unable to get up off the ground and slipped on the ground until someone checked on her this morning.  Patient denies any fever but states over the past 3 days she has been feeling very weak and fatigued.  Denies any cough or congestion.  Denies any chest pain.  States the pain in the arm is minimal as long as she is not moving the arm.   Physical Exam   Triage Vital Signs: ED Triage Vitals  Enc Vitals Group     BP 01/25/22 0730 (!) 88/63     Pulse Rate 01/25/22 0730 97     Resp 01/25/22 0730 20     Temp 01/25/22 0730 (!) 97.5 F (36.4 C)     Temp Source 01/25/22 0730 Oral     SpO2 01/25/22 0730 92 %     Weight 01/25/22 0722 178 lb (80.7 kg)     Height 01/25/22 0722 5\' 6"  (1.676 m)     Head Circumference --      Peak Flow --      Pain Score 01/25/22 0722 7     Pain Loc --      Pain Edu? --      Excl. in [place]? --     Most recent vital signs: Vitals:   01/25/22 0730  BP: (!) 88/63  Pulse: 97  Resp: 20  Temp: (!) 97.5 F (36.4 C)  SpO2: 92%    General: Awake, no distress.  CV:  Good peripheral perfusion.  Regular rate and rhythm  Resp:  Normal effort.  Equal breath sounds bilaterally.  Abd:  No distention.  Soft, nontender.  No rebound or guarding. Other:  Mild tenderness to the.  Neuro vastly intact distally with 2+ radial pulse.   ED Results / Procedures / Treatments   EKG  EKG viewed and interpreted by myself shows what appears to be sinus tachycardia at 102 bpm with a widened QRS, left  axis deviation, largely normal intervals with nonspecific ST changes without ST elevation  RADIOLOGY  I have personally reviewed the x-ray images of the shoulder and humerus I do not see any definitive fractures on my evaluation. CT scan read as negative by radiology X-rays read as negative.   MEDICATIONS ORDERED IN ED: Medications  sodium chloride 0.9 % bolus 1,000 mL (has no administration in time range)     IMPRESSION / MDM / ASSESSMENT AND PLAN / ED COURSE  I reviewed the triage vital signs and the nursing notes.  Patient presents emergency department after a fall last night getting in bed with right arm pain.  States she was unable to get off the floor due to pain and slept on the floor last night.  Patient states for the past 3 days she has been feeling weak and fatigued but denies any cough congestion fever chest pain.  Patient states she did have 1 episode of vomiting last night while attempting to get into bed.  Differential is quite broad, given the patient's weakness she is found to be somewhat hypotensive in the emergency department as well.  We will dose IV fluids, differential would include dehydration/hypovolemia, infectious etiology, metabolic or electrolyte abnormality such as renal insufficiency, COVID/influenza.  Given the patient's fall with right shoulder pain patient could possibly have dislocation versus fracture versus bruising.  We will obtain x-ray imaging.  Unclear if the patient hit her head we will obtain a CT scan of the head as a precaution.  We will continue to closely monitor while awaiting results.  Patient agreeable to plan.  Patient's labs have begun to result, COVID and flu is negative.  Chemistry shows renal insufficiency baseline creatinine around 0.9 currently 1.4 which could contribute some of the patient's hypotension.  Patient's white blood cell count is significantly elevated at 27,000.  Given the patient's elevated white blood cell count and  hypotension with weakness concerning for sepsis we will cover the patient on broad-spectrum antibiotics.  Do not see any definitive findings on x-ray to suggest fracture.  CT scan head is negative.  Patient's troponin is elevated at 166, CK is elevated greater than 300, we will cover with 324 mg of aspirin while awaiting repeat troponin.  If repeat troponin is unchanged do not believe heparin is warranted however if significantly uptrending we will start on heparin.  We will dose 30 mL/kg of fluids given the patient's hypotension and elevated white blood cell count.  Patient's blood pressure is improving.  Troponin is downtrending.  Patient will be admitted to the hospital service for further work-up and treatment.  CRITICAL CARE Performed by: Harvest Dark   Total critical care time: 30 minutes  Critical care time was exclusive of separately billable procedures and treating other patients.  Critical care was necessary to treat or prevent imminent or life-threatening deterioration.  Critical care was time spent personally by me on the following activities: development of treatment plan with patient and/or surrogate as well as nursing, discussions with consultants, evaluation of patient's response to treatment, examination of patient, obtaining history from patient or surrogate, ordering and performing treatments and interventions, ordering and review of laboratory studies, ordering and review of radiographic studies, pulse oximetry and re-evaluation of patient's condition.   FINAL CLINICAL IMPRESSION(S) / ED DIAGNOSES   Fall Right arm injury Weakness Sepsis   Note:  This document was prepared using Dragon voice recognition software and may include unintentional dictation errors.   Harvest Dark, MD 01/25/22 1124

## 2022-01-25 NOTE — H&P (Addendum)
History and Physical    Alison Richardson ZGY:[id number] DOB: 11/10/1933 DOA: 01/25/2022  PCP: Jon Billings, NP  Patient coming from: home   Chief Complaint: fall  HPI: Alison Richardson is a 88 y.o. female with medical history significant for a fib, dm, htn, hypothyroid, PAD, combined systolic/diastolic chf, cad/nstemi, who presents with the above.  Reports about a week of increased urinary frequency/urgency. No dysuria. No fevers. Yesterday felt nauseaus, went to bathroom and vomited. Fell to floor, injuring right arm and shoulder. Was unable to get up and call for help and so spent the night on the floor. No chest pain or dyspnea. No fevers. No diarrhea. No other vomiting.   In the ED was found to be leukycotitic with aki of 1.4 and elevated lactate. Was treated for presumed sepsis with antibiotics and IV fluids.  Shortly prior to my evaluation the patient developed acute shortness of breath and RVR. She was placed on bipap and given 80 IV lasix. CXR shows increase in interstitial opacities compared with cxr on presentation, suggestive of pulmonary edema.    Review of Systems: As per HPI otherwise 10 point review of systems negative.    Past Medical History:  Diagnosis Date   Arrhythmia    Atrial flutter ([place]) 01/2018   new onset    Basal cell carcinoma of back    Basal cell carcinoma of lip    Cerebral aneurysm    followed by [facility]   CHF (congestive heart failure) (HCC)    DDD (degenerative disc disease), lumbar    superior plate depression, L3 08/18/2014   Diabetes mellitus type II, controlled ([place])    Diverticulosis    Dysrhythmia    Paroxysmal Supraventricular Tachycardia   Dysthymia    depression   GERD (gastroesophageal reflux disease)    History of meniscal tear    Hyperlipidemia    Hypertension    Hypothyroidism    Late onset Alzheimer's disease with behavioral disturbance ([place])    Mild pulmonary hypertension ([place])    Overactive bladder    Peripheral vascular  disease ([place])    Seasonal allergic rhinitis    Sleep apnea     Past Surgical History:  Procedure Laterality Date   APPENDECTOMY  1946   BASAL CELL CARCINOMA EXCISION     2006 and 2009 removed from back and lip   CARDIOVASCULAR STRESS TEST  2015   nuclear cardiac stress test negative for ischemia - Dr. Satira Sark   CATARACT EXTRACTION, BILATERAL  2006   COLONOSCOPY  2014   COLONOSCOPY N/A 08/19/2020   Procedure: COLONOSCOPY;  Surgeon: Lesly Rubenstein, MD;  Location: [facility] ENDOSCOPY;  Service: Endoscopy;  Laterality: N/A;   ECTOPIC PREGNANCY SURGERY  1957   ESOPHAGOGASTRODUODENOSCOPY N/A 08/19/2020   Procedure: ESOPHAGOGASTRODUODENOSCOPY (EGD);  Surgeon: Lesly Rubenstein, MD;  Location: [facility] ENDOSCOPY;  Service: Endoscopy;  Laterality: N/A;   INJECTION KNEE Left 05/2015   LEFT HEART CATH AND CORONARY ANGIOGRAPHY N/A 10/12/2021   Procedure: LEFT HEART CATH AND CORONARY ANGIOGRAPHY;  Surgeon: Corey Skains, MD;  Location: [place] CV LAB;  Service: Cardiovascular;  Laterality: N/A;   REPLACEMENT TOTAL KNEE Left 09/06/2016   Dr. Barnet Pall   TONSILLECTOMY AND ADENOIDECTOMY  1953   TOTAL ABDOMINAL HYSTERECTOMY  1986   [facility]     reports that she has never smoked. She has never used smokeless tobacco. She reports current alcohol use of about 3.0 standard drinks per week. She reports that she does not use drugs.  Allergies  Allergen Reactions   Pantoprazole Nausea And Vomiting   Metformin And Related Diarrhea    Family History  Problem Relation Age of Onset   Hypertension Mother    Heart disease Father    CAD Father    Heart disease Sister    Diabetes Sister    Diabetes Paternal Uncle     Prior to Admission medications   Medication Sig Start Date End Date Taking? Authorizing Provider  amiodarone (PACERONE) 200 MG tablet Take 200 mg by mouth daily.  01/02/19  Yes [provider]  clopidogrel (PLAVIX) 75 MG tablet Take 75 mg by mouth daily.   Yes [provider]  donepezil (ARICEPT) 10 MG tablet Take 10 mg by mouth at bedtime.   Yes [provider]  ELIQUIS 5 MG TABS tablet Take 5 mg by mouth 2 (two) times daily.  05/06/20  Yes [provider]  glipiZIDE (GLUCOTROL XL) 2.5 MG 24 hr tablet Take 2.5 mg by mouth daily. 09/19/21  Yes [provider]  isosorbide mononitrate (IMDUR) 30 MG 24 hr tablet Take 30 mg by mouth daily.   Yes [provider]  levothyroxine (SYNTHROID) 88 MCG tablet Take 88 mcg by mouth daily.  09/21/19  Yes [provider]  lisinopril (ZESTRIL) 5 MG tablet Take 5 mg by mouth daily.   Yes [provider]  metoprolol succinate (TOPROL-XL) 100 MG 24 hr tablet TAKE ONE TABLET (100 MG) BY MOUTH EVERY DAY (TAKE WITH OR IMMEDIATELY AFTER A MEAL) 01/11/22  Yes Jon Billings, NP  montelukast (SINGULAIR) 10 MG tablet Take 1 tablet (10 mg total) by mouth at bedtime. 01/11/22  Yes Jon Billings, NP  pravastatin (PRAVACHOL) 20 MG tablet Take 20 mg by mouth at bedtime. 07/05/20  Yes [provider]  torsemide (DEMADEX) 20 MG tablet Take 1 tablet (20 mg total) by mouth daily. 11/03/21  Yes Richarda Osmond, MD  venlafaxine XR (EFFEXOR-XR) 75 MG 24 hr capsule Take 75 mg by mouth daily with breakfast.    Yes [provider]  cetirizine (ZYRTEC) 10 MG tablet Take 10 mg by mouth daily. Patient not taking: Reported on 01/25/2022    [provider]  gabapentin (NEURONTIN) 100 MG capsule Take 100 mg by mouth 2 (two) times daily. 05/06/20   [provider]    Physical Exam: Vitals:   01/25/22 1134 01/25/22 1200 01/25/22 1226 01/25/22 1240  BP: (!) 177/65 (!) 176/88 (!) 151/89   Pulse: (!) 127 (!) 140 (!) 130   Resp: (!) 34 (!) 29 (!) 24   Temp: 98.5 F (36.9 C)     TempSrc: Oral     SpO2: 97% (!) 88% 90% 94%  Weight:      Height:        Constitutional: in distress, diaphoretic Head: Atraumatic Eyes: Conjunctiva clear ENM: Moist mucous  membranes. Normal dentition.  Neck: Supple Respiratory: diffuse wheezing, tachypnea Cardiovascular: tachycardic, irreg irreg Abdomen: Non-tender, non-distended. No masses. No rebound or guarding. Positive bowel sounds. Musculoskeletal: No joint deformity upper and lower extremities. Normal ROM, no contractures. Normal muscle tone.  Skin: bruise left upper arm Extremities: No peripheral edema. Palpable peripheral pulses. Neurologic: Alert, moving all 4 extremities. Psychiatric: Normal insight and judgement.   Labs on Admission: I have personally reviewed following labs and imaging studies  CBC: Recent Labs  Lab 01/25/22 0733  WBC 27.7*  HGB 13.7  HCT 41.7  MCV 88.3  PLT 387   Basic Metabolic Panel: Recent Labs  Lab 01/25/22 0733  NA 138  K 3.2*  CL 98  CO2 27  GLUCOSE 256*  BUN 21  CREATININE 1.41*  CALCIUM 8.9   GFR: Estimated Creatinine Clearance: 30.7 mL/min (A) (by C-G formula based on SCr of 1.41 mg/dL (H)). Liver Function Tests: Recent Labs  Lab 01/25/22 0733  AST 37  ALT 20  ALKPHOS 82  BILITOT 0.9  PROT 7.8  ALBUMIN 3.9   No results for input(s): LIPASE, AMYLASE in the last 168 hours. No results for input(s): AMMONIA in the last 168 hours. Coagulation Profile: Recent Labs  Lab 01/25/22 0848  INR 1.5*   Cardiac Enzymes: Recent Labs  Lab 01/25/22 0742  CKTOTAL 370*   BNP (last 3 results) No results for input(s): PROBNP in the last 8760 hours. HbA1C: No results for input(s): HGBA1C in the last 72 hours. CBG: No results for input(s): GLUCAP in the last 168 hours. Lipid Profile: No results for input(s): CHOL, HDL, LDLCALC, TRIG, CHOLHDL, LDLDIRECT in the last 72 hours. Thyroid Function Tests: No results for input(s): TSH, T4TOTAL, FREET4, T3FREE, THYROIDAB in the last 72 hours. Anemia Panel: No results for input(s): VITAMINB12, FOLATE, FERRITIN, TIBC, IRON, RETICCTPCT in the last 72 hours. Urine analysis:    Component Value Date/Time    COLORURINE YELLOW 01/25/2022 1043   APPEARANCEUR CLEAR 01/25/2022 1043   LABSPEC 1.015 01/25/2022 1043   PHURINE 5.0 01/25/2022 1043   GLUCOSEU 250 (A) 01/25/2022 1043   HGBUR LARGE (A) 01/25/2022 1043   BILIRUBINUR NEGATIVE 01/25/2022 1043   KETONESUR 15 (A) 01/25/2022 1043   PROTEINUR >300 (A) 01/25/2022 1043   NITRITE POSITIVE (A) 01/25/2022 1043   LEUKOCYTESUR MODERATE (A) 01/25/2022 1043    Radiological Exams on Admission: DG Shoulder Right  Result Date: 01/25/2022 CLINICAL DATA:  Fall with right shoulder pain. EXAM: RIGHT SHOULDER - 2+ VIEW COMPARISON:  None. FINDINGS: There is no evidence of fracture or dislocation. There is no evidence of arthropathy or other focal bone abnormality. Soft tissues are unremarkable. IMPRESSION: Negative. Electronically Signed   By: Misty Stanley M.D.   On: 01/25/2022 08:34   CT HEAD WO CONTRAST (5MM)  Result Date: 01/25/2022 CLINICAL DATA:  Trauma EXAM: CT HEAD WITHOUT CONTRAST TECHNIQUE: Contiguous axial images were obtained from the base of the skull through the vertex without intravenous contrast. RADIATION DOSE REDUCTION: This exam was performed according to the departmental dose-optimization program which includes automated exposure control, adjustment of the mA and/or kV according to patient size and/or use of iterative reconstruction technique. COMPARISON:  None. FINDINGS: Brain: Chronic white matter ischemic change. No evidence of acute infarction, hemorrhage, hydrocephalus, extra-axial collection or mass lesion/mass effect. Vascular: No hyperdense vessel or unexpected calcification. Skull: Normal. Negative for fracture or focal lesion. Sinuses/Orbits: No acute finding. Other: None. IMPRESSION: No acute intracranial abnormality. Electronically Signed   By: Yetta Glassman M.D.   On: 01/25/2022 08:12   DG Chest Portable 1 View  Result Date: 01/25/2022 CLINICAL DATA:  SOB EXAM: PORTABLE CHEST 1 VIEW COMPARISON:  Earlier same day FINDINGS: Stable  appearance of the cardiomediastinal silhouette. No pleural effusion. No pneumothorax. Diffuse increase in prominent interstitial opacities. Osseous structures are unremarkable. IMPRESSION: Diffuse increase in interstitial opacities compared to radiograph earlier same day, most suggestive of pulmonary edema. No large pleural effusion. Electronically Signed   By: Albin Felling M.D.   On: 01/25/2022 13:04   DG Chest Port 1 View  Result Date: 01/25/2022 CLINICAL DATA:  Concern for sepsis EXAM: PORTABLE CHEST 1  VIEW COMPARISON:  Chest x-ray dated October 31, 2021 FINDINGS: Cardiac and mediastinal contours are within normal limits. Mild bilateral interstitial opacities. No focal consolidation. No large pleural effusion or pneumothorax. IMPRESSION: Mild bilateral interstitial opacities which are similar to prior and possibly due to pulmonary edema. No focal consolidation to suggest pneumonia. Electronically Signed   By: Yetta Glassman M.D.   On: 01/25/2022 08:56   DG Humerus Right  Result Date: 01/25/2022 CLINICAL DATA:  Fall.  Pain. EXAM: RIGHT HUMERUS - 2+ VIEW COMPARISON:  None. FINDINGS: There is no evidence of fracture or other focal bone lesions. Soft tissues are unremarkable. IMPRESSION: Negative. Electronically Signed   By: Misty Stanley M.D.   On: 01/25/2022 08:34    EKG: Independently reviewed. A fib rvr  Assessment/Plan Principal Problem:   Fall Active Problems:   Paroxysmal atrial fibrillation with rapid ventricular response (HCC)   Vitamin B12 deficiency   Type 2 diabetes mellitus with diabetic neuropathy (HCC)   Benign essential hypertension   Obstructive sleep apnea   Acute cystitis   # Acute cystitis # Sepsis One week urinary frequency, one episode vomiting at home, urinalysis suggestive of infection. The likely inciting event for her other hospital problems. Sepsis by leukocytosis, tachycardia, lactic acidosis. S/p fluid resuscitation - s/p cefepime. Start ceftriaxone - f/u  culture  # A-fib with rvr # Flash pulmonary edema # Acute hypoxic respiratory failure Treated for sepsis with liberal fluids in the ED. Developed flash pulmonary edema and a fib with rvr. Treated with bipap and lasix. Cardiology now following - cont amio gtt - cont bipap, wean as able - lasix 40 iv bid - step-down status - con eliquis  # Demand ischemia Trop [id number], no chest pain, likely demand from above processes - monitor  # Hypokalemia K 3.2 - f/u mg - kcl 80 meq once  # T2DM Here hyperglycemic - SSI  # Fall With right shoulder/arm pain. X-ray neg for fracture. Normal rom hip without pain - pt consult when off bipap  # Acute kidney injury 2/2 uti, immobility. Now s/p fluids - monitor  # Neuropathy - home effexor, gabapentin  # Hypothyroid - home levothyroxine  # CAD - home eliquis, plavix, statin  DVT prophylaxis: eliquis Code Status: dnr  Family Communication: daughter updated @ bedside  Consults called: cardiology   Level of care: Stepdown Status is: Inpatient Remains inpatient appropriate because: severity of illness            Desma Maxim MD [facility] Pager [phone]  If 7PM-7AM, please contact night-coverage [email or web address] Password [email or web address]  01/25/2022, 1:11 PM

## 2022-01-25 NOTE — Sepsis Progress Note (Signed)
Notified bedside nurse of need to draw repeat lactic acid after the IV Fluid bolus is completed.

## 2022-01-25 NOTE — ED Notes (Signed)
Pt became tacycardiac with 4-5 beat runs of non-sustained V-tach. Pt also with tachypnea and lung sounds with ronchi. SpO2 88%. Pt repositioned to high fowlers and placed on 4L via N/C. EDP notified and new medication orders received and given.

## 2022-01-25 NOTE — Consult Note (Signed)
CODE SEPSIS - PHARMACY COMMUNICATION  **Broad Spectrum Antibiotics should be administered within 1 hour of Sepsis diagnosis**  Time Code Sepsis Called/Page Received: 4193  Antibiotics Ordered: 7902  Time of 1st antibiotic administration: 0901  Additional action taken by pharmacy: N/A  If necessary, Name of Provider/Nurse Contacted: N/A    Darnelle Bos ,PharmD Clinical Pharmacist  01/25/2022  8:57 AM

## 2022-01-25 NOTE — Progress Notes (Signed)
PHARMACY - PHYSICIAN COMMUNICATION CRITICAL VALUE ALERT - BLOOD CULTURE IDENTIFICATION (BCID)  Alison Richardson is an 88 y.o. female w/ PMH of a fib, dm, htn, hypothyroid, PAD, combined systolic/diastolic chf, cad/nstemi who presented to [facility] on 01/25/2022 with a chief complaint of a fall. She was started on ceftriaxone  for a UTI by the admitting provider.  Assessment:  4/4 E coli (no resistance detected)   Name of physician (or Provider) ContactedSidney Ace, MD  Current antibiotics: ceftriaxone 1 gram IV every 24 hours  Changes to prescribed antibiotics recommended:  Patient is on recommended antibiotics - No changes needed except dose was changed to 2 grams ceftriaxone IV every 24 hours  Results for orders placed or performed during the hospital encounter of 01/25/22  Blood Culture ID Panel (Reflexed) (Collected: 01/25/2022  8:38 AM)  Result Value Ref Range   Enterococcus faecalis NOT DETECTED NOT DETECTED   Enterococcus Faecium NOT DETECTED NOT DETECTED   Listeria monocytogenes NOT DETECTED NOT DETECTED   Staphylococcus species NOT DETECTED NOT DETECTED   Staphylococcus aureus (BCID) NOT DETECTED NOT DETECTED   Staphylococcus epidermidis NOT DETECTED NOT DETECTED   Staphylococcus lugdunensis NOT DETECTED NOT DETECTED   Streptococcus species NOT DETECTED NOT DETECTED   Streptococcus agalactiae NOT DETECTED NOT DETECTED   Streptococcus pneumoniae NOT DETECTED NOT DETECTED   Streptococcus pyogenes NOT DETECTED NOT DETECTED   A.calcoaceticus-baumannii NOT DETECTED NOT DETECTED   Bacteroides fragilis NOT DETECTED NOT DETECTED   Enterobacterales DETECTED (A) NOT DETECTED   Enterobacter cloacae complex NOT DETECTED NOT DETECTED   Escherichia coli DETECTED (A) NOT DETECTED   Klebsiella aerogenes NOT DETECTED NOT DETECTED   Klebsiella oxytoca NOT DETECTED NOT DETECTED   Klebsiella pneumoniae NOT DETECTED NOT DETECTED   Proteus species NOT DETECTED NOT DETECTED   Salmonella species NOT  DETECTED NOT DETECTED   Serratia marcescens NOT DETECTED NOT DETECTED   Haemophilus influenzae NOT DETECTED NOT DETECTED   Neisseria meningitidis NOT DETECTED NOT DETECTED   Pseudomonas aeruginosa NOT DETECTED NOT DETECTED   Stenotrophomonas maltophilia NOT DETECTED NOT DETECTED   Candida albicans NOT DETECTED NOT DETECTED   Candida auris NOT DETECTED NOT DETECTED   Candida glabrata NOT DETECTED NOT DETECTED   Candida krusei NOT DETECTED NOT DETECTED   Candida parapsilosis NOT DETECTED NOT DETECTED   Candida tropicalis NOT DETECTED NOT DETECTED   Cryptococcus neoformans/gattii NOT DETECTED NOT DETECTED   CTX-M ESBL NOT DETECTED NOT DETECTED   Carbapenem resistance IMP NOT DETECTED NOT DETECTED   Carbapenem resistance KPC NOT DETECTED NOT DETECTED   Carbapenem resistance NDM NOT DETECTED NOT DETECTED   Carbapenem resist OXA 48 LIKE NOT DETECTED NOT DETECTED   Carbapenem resistance VIM NOT DETECTED NOT DETECTED    Dallie Piles 01/25/2022  9:00 PM

## 2022-01-25 NOTE — ED Notes (Signed)
Pt states she needs to have a BM. This Probation officer and Minette Brine, RN in rm to assist pt with bedpan. Pt cleansed, new purewick in place, and pt sat up in bed at this time. No other needs voiced.

## 2022-01-25 NOTE — ED Notes (Signed)
Critical Result: Lactic 3.1 MD aware

## 2022-01-25 NOTE — Consult Note (Addendum)
[place] NOTE       Patient ID: Alison Richardson MRN: [id number] DOB/AGE: [birth year] 88 y.o.  Admit date: 01/25/2022 Referring Physician Dr. Si Raider  Primary Physician Dr. Emily Filbert Primary Cardiologist Dr. Corky Sox  Reason for Consultation AF RVR  HPI: The patient is a 88yoF with PMH significant for paroxysmal atrial fibrillation on amiodarone and Eliquis, CAD with history of NSTEMI 09/2021 with LHC showing 99% proximal RCA stenosis and was treated medically, HFrEF (LVEF 40-45 by LV gram), HTN, hyperlipidemia, type 2 diabetes who presented to [facility] ED the morning of 01/25/22 after a fall, injuring her right arm. She spent the night on the floor of her apartment unable to get up. She is admitted with sepsis likely from a UTI. Cardiology is consulted for assistance with her AF with RVR.   She was given 1 L bolus of NS, 1.5 of LR in addition to empiric cefepime, Flagyl, vancomycin which resulted in flash pulmonary edema and was transition from 6 L of nasal cannula to BiPAP.  Initial EKG at 0721 showed A. fib with a rate of 99, repeat EKG after receiving all the fluids showed A. fib with RVR at 145 at 1300.  She was given an amiodarone bolus and infusion with heart rate better controlled in the 110s-120s.   She received 80 mg of IV Lasix in the ED and per nursing reporting had significant output onto Chux pad before purewick placed.  During interview she is on BiPAP and states she feels better than when she came in to the ED.  She states that she was feeling poorly for a few days and had 1 episode of vomiting, and subsequently passed out and was unable to call for help until the morning.  She states she was having back pain that is now resolved, denies chest pain, palpitations, dysuria.  Labs on admission significant for cr 1.41 and GFR 36 (baseline 1.0 and GFR 53).  Urinalysis shows moderate leuks, positive for nitrates, proteinuria and many bacteria present.  Initial chest x-ray at  0856 revealed mild bilateral interstitial opacities without focal consolidation, without large pleural effusion.  Second chest x-ray at 1245 showed diffuse increase in interstitial opacities compared to earlier in the day suggestive of pulmonary edema without large pleural effusion.   Review of systems complete and found to be negative unless listed above     Past Medical History:  Diagnosis Date   Arrhythmia    Atrial flutter ( [place]) 01/2018   new onset    Basal cell carcinoma of back    Basal cell carcinoma of lip    Cerebral aneurysm    followed by [facility]   CHF (congestive heart failure) (HCC)    DDD (degenerative disc disease), lumbar    superior plate depression, L3 08/18/2014   Diabetes mellitus type II, controlled ([place])    Diverticulosis    Dysrhythmia    Paroxysmal Supraventricular Tachycardia   Dysthymia    depression   GERD (gastroesophageal reflux disease)    History of meniscal tear    Hyperlipidemia    Hypertension    Hypothyroidism    Late onset Alzheimer's disease with behavioral disturbance ([place])    Mild pulmonary hypertension (HCC)    Overactive bladder    Peripheral vascular disease (HCC)    Seasonal allergic rhinitis    Sleep apnea     Past Surgical History:  Procedure Laterality Date   APPENDECTOMY  1946   BASAL CELL CARCINOMA EXCISION  2006 and 2009 removed from back and lip   CARDIOVASCULAR STRESS TEST  2015   nuclear cardiac stress test negative for ischemia - Dr. Satira Sark   CATARACT EXTRACTION, BILATERAL  2006   COLONOSCOPY  2014   COLONOSCOPY N/A 08/19/2020   Procedure: COLONOSCOPY;  Surgeon: Lesly Rubenstein, MD;  Location[facility] END[facility]COPY;  Service: Endoscopy;  Laterality: N/A;   ECTOPIC PREGNANCY SURGERY  1957   ESOPHAGOGASTRODUODENOSCOPY N/A 08/19/2020   Procedure: ESOPHAGOGASTRODUODENOSCOPY (EGD);  Surgeon: Lesly Rubenstein, MD;  Location: [facility] ENDOSCOPY;  Service: Endoscopy;  Laterality: N/A;   INJECTION KNEE Left 05/2015   LEFT HEART  CATH AND CORONARY ANGIOGRAPHY N/A 10/12/2021   Procedure: LEFT HEART CATH AND CORONARY ANGIOGRAPHY;  Surgeon: Corey Skains, MD;  Location: [facility] LAB;  Service: Cardiovascular;  Laterality: N/A;   REPLACEMENT TOTAL KNEE Left 09/06/2016   Dr. Barnet Pall   TONSILLECTOMY AND ADENOIDECTOMY  1953   TOTAL ABDOMINAL HYSTERECTOMY  1986   DU B    (Not in a hospital admission)  Social History   Socioeconomic History   Marital status: Widowed    Spouse name: Not on file   Number of children: 5   Years of education: Not on file   Highest education level: Not on file  Occupational History   Not on file  Tobacco Use   Smoking status: Never   Smokeless tobacco: Never  Vaping Use   Vaping Use: Never used  Substance and Sexual Activity   Alcohol use: Yes    Alcohol/week: 3.0 standard drinks    Types: 3 Shots of liquor per week    Comment: 3 x week    Drug use: Never   Sexual activity: Not Currently  Other Topics Concern   Not on file  Social History Narrative   Not on file   Social Determinants of Health   Financial Resource Strain: Not on file  Food Insecurity: Not on file  Transportation Needs: Not on file  Physical Activity: Not on file  Stress: Not on file  Social Connections: Not on file  Intimate Partner Violence: Not on file    Family History  Problem Relation Age of Onset   Hypertension Mother    Heart disease Father    CAD Father    Heart disease Sister    Diabetes Sister    Diabetes Paternal Uncle     Review of systems complete and found to be negative unless listed above   PHYSICAL EXAM General: Elderly and ill-appearing Caucasian female, well nourished, on BiPAP during interview.  Daughter at bedside. HEENT:  Normocephalic and atraumatic. Neck:  No JVD.  Lungs: Clear to auscultation anteriorly, on BiPAP Heart: Tachy irregularly irregular rate and rhythm. Normal S1 and S2 difficult to auscultate murmurs over lung sounds.  Radial & DP pulses 2+  bilaterally. Abdomen: Soft, nontender but quite distended.  Msk: Normal strength and tone for age. Extremities: Warm and well perfused. Left upper arm with large ecchymosis. No clubbing, cyanosis.  No lower extremity edema.  Neuro: Alert and oriented X 3. Psych:  Answers questions appropriately.   Labs:   Lab Results  Component Value Date   WBC 27.7 (H) 01/25/2022   HGB 13.7 01/25/2022   HCT 41.7 01/25/2022   MCV 88.3 01/25/2022   PLT 277 01/25/2022    Recent Labs  Lab 01/25/22 0733  NA 138  K 3.2*  CL 98  CO2 27  BUN 21  CREATININE 1.41*  CALCIUM 8.9  PROT  7.8  BILITOT 0.9  ALKPHOS 82  ALT 20  AST 37  GLUCOSE 256*   Lab Results  Component Value Date   CKTOTAL 370 (H) 01/25/2022    Lab Results  Component Value Date   CHOL 195 01/11/2022   CHOL 166 05/10/2020   CHOL 103 06/17/2018   Lab Results  Component Value Date   HDL 45 01/11/2022   HDL 51 05/10/2020   HDL 37 (L) 06/17/2018   Lab Results  Component Value Date   LDLCALC 102 (H) 01/11/2022   LDLCALC 98 05/10/2020   LDLCALC 45 06/17/2018   Lab Results  Component Value Date   TRIG 281 (H) 01/11/2022   TRIG 84 05/10/2020   TRIG 107 06/17/2018   Lab Results  Component Value Date   CHOLHDL 4.3 01/11/2022   CHOLHDL 3.3 05/10/2020   CHOLHDL 2.8 06/17/2018   No results found for: LDLDIRECT    Radiology: DG Shoulder Right  Result Date: 01/25/2022 CLINICAL DATA:  Fall with right shoulder pain. EXAM: RIGHT SHOULDER - 2+ VIEW COMPARISON:  None. FINDINGS: There is no evidence of fracture or dislocation. There is no evidence of arthropathy or other focal bone abnormality. Soft tissues are unremarkable. IMPRESSION: Negative. Electronically Signed   By: Misty Stanley M.D.   On: 01/25/2022 08:34   CT HEAD WO CONTRAST (5MM)  Result Date: 01/25/2022 CLINICAL DATA:  Trauma EXAM: CT HEAD WITHOUT CONTRAST TECHNIQUE: Contiguous axial images were obtained from the base of the skull through the vertex without  intravenous contrast. RADIATION DOSE REDUCTION: This exam was performed according to the departmental dose-optimization program which includes automated exposure control, adjustment of the mA and/or kV according to patient size and/or use of iterative reconstruction technique. COMPARISON:  None. FINDINGS: Brain: Chronic white matter ischemic change. No evidence of acute infarction, hemorrhage, hydrocephalus, extra-axial collection or mass lesion/mass effect. Vascular: No hyperdense vessel or unexpected calcification. Skull: Normal. Negative for fracture or focal lesion. Sinuses/Orbits: No acute finding. Other: None. IMPRESSION: No acute intracranial abnormality. Electronically Signed   By: Yetta Glassman M.D.   On: 01/25/2022 08:12   DG Chest Portable 1 View  Result Date: 01/25/2022 CLINICAL DATA:  SOB EXAM: PORTABLE CHEST 1 VIEW COMPARISON:  Earlier same day FINDINGS: Stable appearance of the cardiomediastinal silhouette. No pleural effusion. No pneumothorax. Diffuse increase in prominent interstitial opacities. Osseous structures are unremarkable. IMPRESSION: Diffuse increase in interstitial opacities compared to radiograph earlier same day, most suggestive of pulmonary edema. No large pleural effusion. Electronically Signed   By: Albin Felling M.D.   On: 01/25/2022 13:04   DG Chest Port 1 View  Result Date: 01/25/2022 CLINICAL DATA:  Concern for sepsis EXAM: PORTABLE CHEST 1 VIEW COMPARISON:  Chest x-ray dated October 31, 2021 FINDINGS: Cardiac and mediastinal contours are within normal limits. Mild bilateral interstitial opacities. No focal consolidation. No large pleural effusion or pneumothorax. IMPRESSION: Mild bilateral interstitial opacities which are similar to prior and possibly due to pulmonary edema. No focal consolidation to suggest pneumonia. Electronically Signed   By: Yetta Glassman M.D.   On: 01/25/2022 08:56   DG Humerus Right  Result Date: 01/25/2022 CLINICAL DATA:  Fall.  Pain.  EXAM: RIGHT HUMERUS - 2+ VIEW COMPARISON:  None. FINDINGS: There is no evidence of fracture or other focal bone lesions. Soft tissues are unremarkable. IMPRESSION: Negative. Electronically Signed   By: Misty Stanley M.D.   On: 01/25/2022 08:34    ECHO 10/31/21   1. Left ventricular ejection fraction, by  estimation, is 45 to 50%. The  left ventricle has mildly decreased function. The left ventricle has no  regional wall motion abnormalities. Left ventricular diastolic parameters  were normal.   2. Right ventricular systolic function is normal. The right ventricular  size is normal.   3. The mitral valve is normal in structure. Mild mitral valve  regurgitation. No evidence of mitral stenosis.   4. The aortic valve is normal in structure. Aortic valve regurgitation is  not visualized. No aortic stenosis is present.   5. The inferior vena cava is normal in size with greater than 50%  respiratory variability, suggesting right atrial pressure of 3 mmHg.   TELEMETRY reviewed by me: AF vs sinus tach rate   EKG reviewed by me: Initial EKG at 0721 showed A. fib with a rate of 99, repeat EKG after receiving all the fluids showed A. fib with RVR at 145 at 1300.   ASSESSMENT AND PLAN:  The patient is a 25yoF with PMH significant for paroxysmal atrial fibrillation on amiodarone and Eliquis, CAD with history of NSTEMI 10/12/21 with LHC showing 99% proximal RCA stenosis and was treated medically, HFrEF (10/2021 LVEF 40-45 %), HTN, hyperlipidemia, type 2 diabetes who presented to [facility] ED the morning of 01/25/22 after a fall, injuring her right arm. She spent the night on the floor of her apartment unable to get up. She is admitted with sepsis likely from a UTI. Cardiology is consulted for assistance with her AF with RVR.   #paroxysmal AF with RVR  #sepsis 2/2 UTI #elevated troponin 2/2 demand ischemia The patient presented with a few day history of fatigue, weakness and passed out and fell last night. She  remained on the floor overnight until she was able to call for help this morning. UA positive for leuks, nitrites, bacteria, lactate 3.1, WBC of 30 and she was giving significant volume of 2.5L fluids and more with empiric abx, resulting in flash pulmonary edema, respiratory distress requiring BIPAP and AF with RVR. S/p IV lasix 80 with good output.  Troponin 166-129 likely related to demand ischemia from AF and underlying infection. Pt has underlying CAD but currently denies chest pain.  -Recommend treat underlying UTI, judicious use of IVF -wean Bipap as tolerated.  -continue amiodarone gtt for 24 hours and once off bipap and tolerating PO transition to 400mg  BID x 1 week  -repeat EKG in the morning when HR is better controlled.  -recommend IV lasix 40mg  x2 and will reassess daily  -home meds: amiodarone 200mg  daily, 50mg  metop succinate and eliquis 5 BID for stroke risk reduction -chadsvasc ~6   #CAD with Hx NSTEMI 09/2021, medically managed #HTN #hyperlipidemia -defer heparin gtt -continue Eliquis 5 mg twice daily as above -continue plavix, pravastatin 20mg . LDL 102 and trigs 281. Consider escalation of statin therapy  -hold antihypertensives as BP soft - systolic high 36O/QHU 765Y during interview. On lisinopril 5mg , imdur 30, and metop 50mg  at home.  -recommend continuing cardiac rehab at discharge.   This patient's plan of care was discussed and created with Dr. Donnelly Angelica and he is in agreement.  Signed: Tristan Schroeder , PA-C 01/25/2022, 1:33 PM [facility] Please [place] 7a-4p M-F.

## 2022-01-26 ENCOUNTER — Encounter: Payer: Self-pay | Admitting: Obstetrics and Gynecology

## 2022-01-26 ENCOUNTER — Inpatient Hospital Stay: Payer: Medicare PPO

## 2022-01-26 DIAGNOSIS — R112 Nausea with vomiting, unspecified: Secondary | ICD-10-CM | POA: Diagnosis not present

## 2022-01-26 DIAGNOSIS — W19XXXA Unspecified fall, initial encounter: Secondary | ICD-10-CM | POA: Diagnosis not present

## 2022-01-26 LAB — CBC
HCT: 33.1 % — ABNORMAL LOW (ref 36.0–46.0)
Hemoglobin: 10.8 g/dL — ABNORMAL LOW (ref 12.0–15.0)
MCH: 29.6 pg (ref 26.0–34.0)
MCHC: 32.6 g/dL (ref 30.0–36.0)
MCV: 90.7 fL (ref 80.0–100.0)
Platelets: 206 10*3/uL (ref 150–400)
RBC: 3.65 MIL/uL — ABNORMAL LOW (ref 3.87–5.11)
RDW: 13.6 % (ref 11.5–15.5)
WBC: 23.5 10*3/uL — ABNORMAL HIGH (ref 4.0–10.5)
nRBC: 0 % (ref 0.0–0.2)

## 2022-01-26 LAB — URINE CULTURE: Culture: NO GROWTH

## 2022-01-26 LAB — BASIC METABOLIC PANEL
Anion gap: 9 (ref 5–15)
BUN: 23 mg/dL (ref 8–23)
CO2: 22 mmol/L (ref 22–32)
Calcium: 7.6 mg/dL — ABNORMAL LOW (ref 8.9–10.3)
Chloride: 108 mmol/L (ref 98–111)
Creatinine, Ser: 1.1 mg/dL — ABNORMAL HIGH (ref 0.44–1.00)
GFR, Estimated: 49 mL/min — ABNORMAL LOW (ref >60)
Glucose, Bld: 127 mg/dL — ABNORMAL HIGH (ref 70–99)
Potassium: 3.4 mmol/L — ABNORMAL LOW (ref 3.5–5.1)
Sodium: 139 mmol/L (ref 135–145)

## 2022-01-26 LAB — GLUCOSE, CAPILLARY
Glucose-Capillary: 103 mg/dL — ABNORMAL HIGH (ref 70–99)
Glucose-Capillary: 122 mg/dL — ABNORMAL HIGH (ref 70–99)
Glucose-Capillary: 216 mg/dL — ABNORMAL HIGH (ref 70–99)
Glucose-Capillary: 139 mg/dL — ABNORMAL HIGH (ref 70–99)
Glucose-Capillary: 220 mg/dL — ABNORMAL HIGH (ref 70–99)

## 2022-01-26 LAB — LACTIC ACID, PLASMA: Lactic Acid, Venous: 2.2 mmol/L (ref 0.5–1.9)

## 2022-01-26 LAB — MAGNESIUM: Magnesium: 1.6 mg/dL — ABNORMAL LOW (ref 1.7–2.4)

## 2022-01-26 LAB — MRSA NEXT GEN BY PCR, NASAL: MRSA by PCR Next Gen: NOT DETECTED

## 2022-01-26 MED ORDER — FUROSEMIDE 10 MG/ML IJ SOLN
40.0000 mg | Freq: Two times a day (BID) | INTRAMUSCULAR | Status: DC
  Administered 2022-01-26: 40 mg via INTRAVENOUS
  Filled 2022-01-26: qty 4

## 2022-01-26 MED ORDER — POTASSIUM CHLORIDE CRYS ER 20 MEQ PO TBCR
60.0000 meq | EXTENDED_RELEASE_TABLET | Freq: Once | ORAL | Status: AC
  Administered 2022-01-26: 60 meq via ORAL
  Filled 2022-01-26: qty 3

## 2022-01-26 MED ORDER — CHLORHEXIDINE GLUCONATE CLOTH 2 % EX PADS
6.0000 | MEDICATED_PAD | Freq: Every day | CUTANEOUS | Status: DC
  Administered 2022-01-26 – 2022-01-30 (×3): 6 via TOPICAL
  Filled 2022-01-26: qty 6

## 2022-01-26 MED ORDER — MAGNESIUM SULFATE 2 GM/50ML IV SOLN
2.0000 g | Freq: Once | INTRAVENOUS | Status: AC
  Administered 2022-01-26: 2 g via INTRAVENOUS
  Filled 2022-01-26: qty 50

## 2022-01-26 NOTE — TOC Initial Note (Signed)
Transition of Care [facility]) - Initial/Assessment Note    Patient Details  Name: Alison Richardson MRN: [id number] Date of Birth: May 14, 1933  Transition of Care [facility]) CM/SW Contact:    Shelbie Hutching, RN Phone Number: 01/26/2022, 2:51 PM  Clinical Narrative:                 Patient admitted to the hospital for sepsis and AKI.  RNCM met with patient and her son at the bedside this morning.  Patient reports that she lives at [facility].  She has been there for about 4 years.  She is independent at home and has a walker and cane if needed- she has been using the walker lately.  [facility] provides meals and transportation.  Patient is current with Jon Billings NP at [facility] practice for PCP and she uses [facility] for prescriptions.   Patient will agree to rehab if recommended.  She could go to [facility] for SNF if needed.   PT and OT consults pending.    TOC will cont to follow.    Expected Discharge Plan: Skilled Nursing Facility Barriers to Discharge: Continued Medical Work up   Patient Goals and CMS Choice Patient states their goals for this hospitalization and ongoing recovery are:: To get back to where she was CMS Medicare.gov Compare Post Acute Care list provided to:: Patient Choice offered to / list presented to : Patient  Expected Discharge Plan and Services Expected Discharge Plan: [place]   Discharge Planning Services: CM Consult Post Acute Care Choice: [place] Living arrangements for the past 2 months: [place]                 DME Arranged: N/A DME Agency: NA                  Prior Living Arrangements/Services Living arrangements for the past 2 months: [place] Lives with:: Self Patient language and need for interpreter reviewed:: Yes Do you feel safe going back to the place where you live?: Yes      Need for Family Participation in Patient Care: Yes  (Comment) Care giver support system in place?: Yes (comment) (daughter, son) Current home services: DME (walker, cane) Criminal Activity/Legal Involvement Pertinent to Current Situation/Hospitalization: No - Comment as needed  Activities of Daily Living Home Assistive Devices/Equipment: None ADL Screening (condition at time of admission) Patient's cognitive ability adequate to safely complete daily activities?: Yes Is the patient deaf or have difficulty hearing?: No Does the patient have difficulty seeing, even when wearing glasses/contacts?: No Does the patient have difficulty concentrating, remembering, or making decisions?: No Patient able to express need for assistance with ADLs?: Yes Does the patient have difficulty dressing or bathing?: No Independently performs ADLs?: Yes (appropriate for developmental age) Does the patient have difficulty walking or climbing stairs?: No Weakness of Legs: None Weakness of Arms/Hands: None  Permission Sought/Granted Permission sought to share information with : Case Manager, Family Supports, Chartered certified accountant granted to share information with : Yes, Verbal Permission Granted  Share Information with NAME: Aseel [place]  Permission granted to share info w AGENCY: [facility]  Permission granted to share info w Relationship: daughter  Permission granted to share info w Contact Information: [phone]  Emotional Assessment Appearance:: Appears stated age Attitude/Demeanor/Rapport: Engaged Affect (typically observed): Accepting Orientation: : Oriented to Self, Oriented to Place, Oriented to  Time, Oriented to Situation Alcohol / Substance Use: Not Applicable Psych Involvement:  No (comment)  Admission diagnosis:  Weakness [R53.1] Fall [W19.XXXA] AKI (acute kidney injury) ([place]) [N17.9] Hypotension, unspecified hypotension type [I95.9] Sepsis, due to unspecified organism, unspecified whether acute organ dysfunction  present [facility]) [A41.9] Patient Active Problem List   Diagnosis Date Noted   Fall 01/25/2022   Acute cystitis 01/25/2022   DDD (degenerative disc disease), lumbar    Acute on chronic systolic CHF (congestive heart failure) ([place]) 10/31/2021   HLD (hyperlipidemia) 10/31/2021   PAF (paroxysmal atrial fibrillation) ([place]) 10/31/2021   CAD (coronary artery disease) 10/31/2021   Acute respiratory failure with hypoxia ([place]) 10/31/2021   Acute on chronic diastolic CHF (congestive heart failure) ([place]) 10/31/2021   NSTEMI (non-ST elevated myocardial infarction) ([place]) 10/10/2021   Chronic diastolic CHF (congestive heart failure) ([place]) 10/13/2020   Acute CHF (congestive heart failure) ([place]) 10/07/2020   Elevated troponin 10/07/2020   CHF (congestive heart failure) ([place]) 10/07/2020   Leukocytosis 10/07/2020   DOE (dyspnea on exertion) 05/09/2020   Syncope and collapse 09/30/2019   Syncope 09/29/2019   Cor pulmonale, chronic ([place]) 09/27/2019   Late onset Alzheimer's disease without behavioral disturbance ([place]) 09/23/2019   Obstructive sleep apnea 07/09/2019   Diabetes mellitus type 2 in nonobese ([place]) 01/21/2019   Dyslipidemia 01/21/2019   PSVT (paroxysmal supraventricular tachycardia) ([place]) 01/21/2019   Tenosynovitis of foot 01/21/2019   PAD (peripheral artery disease) ([place]) 01/06/2019   Preop testing 01/06/2019   DM type 2 with diabetic mixed hyperlipidemia ([place]) 09/08/2018   Peripheral neuropathy, idiopathic 09/08/2018   Anxiety disorder 06/19/2018   Depression, major, single episode, mild ([place]) 06/19/2018   Paroxysmal atrial fibrillation with rapid ventricular response ([place]) 06/19/2018   Atrial flutter ([place]) 06/19/2018   Pure hypercholesterolemia 06/19/2018   Vitamin B12 deficiency 06/19/2018   Insomnia 06/19/2018   Protein-calorie malnutrition ([place]) 06/19/2018   Type 2 diabetes mellitus with diabetic neuropathy ([place]) 06/19/2018   Knee pain 06/19/2018   Cognitive impairment, mild, so  stated 06/19/2018   Osteoarthritis of right knee 06/19/2018   Benign essential hypertension 06/19/2018   Hypothyroidism 06/19/2018   Overactive bladder 06/19/2018   Generalized muscle weakness 06/19/2018   Difficulty in walking, not elsewhere classified 06/19/2018   Major depression in remission ([place]) 06/18/2018   Primary osteoarthritis involving multiple joints 06/18/2018   Mixed hyperlipidemia 11/01/2015   Cerebral aneurysm 04/29/2013   PCP:  Jon Billings, NP Pharmacy:   [place], [place] [address] [place] [address] Phone: [phone] Fax: [phone]     Social Determinants of Health (SDOH) Interventions    Readmission Risk Interventions Readmission Risk Prevention Plan 01/26/2022 11/02/2021  Transportation Screening Complete Complete  PCP or Specialist Appt within 3-5 Days Complete Complete  HRI or Home Care Consult Complete Complete  Social Work Consult for [place] Planning/Counseling Complete Complete  Palliative Care Screening Not Applicable Not Applicable  Medication Review Press photographer) Complete Complete  Some recent data might be hidden

## 2022-01-26 NOTE — Progress Notes (Signed)
[place] NOTE       Patient ID: Alison Richardson MRN: [id number] DOB/AGE: 1933-05-19 88 y.o.  Admit date: 01/25/2022 Referring Physician Dr. Si Raider  Primary Physician Dr. Emily Filbert Primary Cardiologist Dr. Corky Sox  Reason for Consultation AF RVR  HPI: The patient is a 89yoF with PMH significant for paroxysmal atrial fibrillation on amiodarone and Eliquis, CAD with history of NSTEMI 09/2021 with LHC showing 99% proximal RCA stenosis and was treated medically, HFrEF (LVEF 40-45 by LV gram), HTN, hyperlipidemia, type 2 diabetes who presented to [facility] ED the morning of 01/25/22 after a fall, injuring her right arm. She spent the night on the floor of her apartment unable to get up. She is admitted with sepsis likely from a UTI. Cardiology is consulted for assistance with her AF with RVR.   Interval history: -feels better than yesterday, no chest pain. Started having a dry cough this morning.  -transferred to the ICU overnight, BP has remained soft. Remains in A flutter ~135  -no output recorded yesterday, +1.6L   Review of systems complete and found to be negative unless listed above     Past Medical History:  Diagnosis Date   Arrhythmia    Atrial flutter ([place]) 01/2018   new onset    Basal cell carcinoma of back    Basal cell carcinoma of lip    Cerebral aneurysm    followed by [facility]   CHF (congestive heart failure) (HCC)    DDD (degenerative disc disease), lumbar    superior plate depression, L3 08/18/2014   Diabetes mellitus type II, controlled ([place])    Diverticulosis    Dysrhythmia    Paroxysmal Supraventricular Tachycardia   Dysthymia    depression   GERD (gastroesophageal reflux disease)    History of meniscal tear    Hyperlipidemia    Hypertension    Hypothyroidism    Late onset Alzheimer's disease with behavioral disturbance ([place])    Mild pulmonary hypertension ([place])    Overactive bladder    Peripheral vascular disease ([place])    Seasonal allergic  rhinitis    Sleep apnea     Past Surgical History:  Procedure Laterality Date   APPENDECTOMY  1946   BASAL CELL CARCINOMA EXCISION     2006 and 2009 removed from back and lip   CARDIOVASCULAR STRESS TEST  2015   nuclear cardiac stress test negative for ischemia - Dr. Satira Sark   CATARACT EXTRACTION, BILATERAL  2006   COLONOSCOPY  2014   COLONOSCOPY N/A 08/19/2020   Procedure: COLONOSCOPY;  Surgeon: Lesly Rubenstein, MD;  Location: [facility] ENDOSCOPY;  Service: Endoscopy;  Laterality: N/A;   ECTOPIC PREGNANCY SURGERY  1957   ESOPHAGOGASTRODUODENOSCOPY N/A 08/19/2020   Procedure: ESOPHAGOGASTRODUODENOSCOPY (EGD);  Surgeon: Lesly Rubenstein, MD;  Location: [facility] ENDOSCOPY;  Service: Endoscopy;  Laterality: N/A;   INJECTION KNEE Left 05/2015   LEFT HEART CATH AND CORONARY ANGIOGRAPHY N/A 10/12/2021   Procedure: LEFT HEART CATH AND CORONARY ANGIOGRAPHY;  Surgeon: Corey Skains, MD;  Location: [facility];  Service: Cardiovascular;  Laterality: N/A;   REPLACEMENT TOTAL KNEE Left 09/06/2016   Dr. Barnet Pall   TONSILLECTOMY AND ADENOIDECTOMY  1953   TOTAL ABDOMINAL HYSTERECTOMY  1986   [facility]    Medications Prior to Admission  Medication Sig Dispense Refill Last Dose   amiodarone (PACERONE) 200 MG tablet Take 200 mg by mouth daily.    01/24/2022 at 1000   clopidogrel (PLAVIX) 75 MG tablet Take 75  mg by mouth daily.   01/24/2022 at 1000   donepezil (ARICEPT) 10 MG tablet Take 10 mg by mouth at bedtime.   01/24/2022 at 1000   ELIQUIS 5 MG TABS tablet Take 5 mg by mouth 2 (two) times daily.    01/24/2022 at 1000   glipiZIDE (GLUCOTROL XL) 2.5 MG 24 hr tablet Take 2.5 mg by mouth daily.   01/24/2022 at 1000   isosorbide mononitrate (IMDUR) 30 MG 24 hr tablet Take 30 mg by mouth daily.   01/24/2022 at 1000   levothyroxine (SYNTHROID) 88 MCG tablet Take 88 mcg by mouth daily.    01/24/2022 at 1000   lisinopril (ZESTRIL) 5 MG tablet Take 5 mg by mouth daily.   01/24/2022 at 1000   metoprolol succinate  (TOPROL-XL) 100 MG 24 hr tablet TAKE ONE TABLET (100 MG) BY MOUTH EVERY DAY (TAKE WITH OR IMMEDIATELY AFTER A MEAL) 90 tablet 1 01/24/2022 at 1000   montelukast (SINGULAIR) 10 MG tablet Take 1 tablet (10 mg total) by mouth at bedtime. 30 tablet 0 01/24/2022 at 1000   pravastatin (PRAVACHOL) 20 MG tablet Take 20 mg by mouth at bedtime.   01/24/2022 at 2000   torsemide (DEMADEX) 20 MG tablet Take 1 tablet (20 mg total) by mouth daily. 30 tablet 0 01/24/2022 at 1000   venlafaxine XR (EFFEXOR-XR) 75 MG 24 hr capsule Take 75 mg by mouth daily with breakfast.    01/24/2022 at 1000   cetirizine (ZYRTEC) 10 MG tablet Take 10 mg by mouth daily. (Patient not taking: Reported on 01/25/2022)   Not Taking   gabapentin (NEURONTIN) 100 MG capsule Take 100 mg by mouth 2 (two) times daily.   prn at prn    Social History   Socioeconomic History   Marital status: Widowed    Spouse name: Not on file   Number of children: 5   Years of education: Not on file   Highest education level: Not on file  Occupational History   Not on file  Tobacco Use   Smoking status: Never   Smokeless tobacco: Never  Vaping Use   Vaping Use: Never used  Substance and Sexual Activity   Alcohol use: Yes    Alcohol/week: 3.0 standard drinks    Types: 3 Shots of liquor per week    Comment: 3 x week    Drug use: Never   Sexual activity: Not Currently  Other Topics Concern   Not on file  Social History Narrative   Not on file   Social Determinants of Health   Financial Resource Strain: Not on file  Food Insecurity: Not on file  Transportation Needs: Not on file  Physical Activity: Not on file  Stress: Not on file  Social Connections: Not on file  Intimate Partner Violence: Not on file    Family History  Problem Relation Age of Onset   Hypertension Mother    Heart disease Father    CAD Father    Heart disease Sister    Diabetes Sister    Diabetes Paternal Uncle     Review of systems complete and found to be negative unless  listed above   PHYSICAL EXAM General: Elderly appearing Caucasian female, well nourished. Sitting upright in icu bed with son and daughter at bedside  HEENT:  Normocephalic and atraumatic. Neck:  No JVD.  Lungs: normal work of breathing on room air. Clear to auscultation bilaterally.  Heart: Tachy irregularly irregular rate and rhythm. Normal S1 and S2. No  murmurs.  Radial & DP pulses 2+ bilaterally. Abdomen: Soft, nontender but quite distended.  Msk: Normal strength and tone for age. Extremities: Warm and well perfused. Left upper arm with large ecchymosis. No clubbing, cyanosis.  No lower extremity edema.  Neuro: Alert and oriented X 3. Psych:  Answers questions appropriately.   Labs:   Lab Results  Component Value Date   WBC 23.5 (H) 01/26/2022   HGB 10.8 (L) 01/26/2022   HCT 33.1 (L) 01/26/2022   MCV 90.7 01/26/2022   PLT 206 01/26/2022    Recent Labs  Lab 01/25/22 0733 01/26/22 0800  NA 138 139  K 3.2* 3.4*  CL 98 108  CO2 27 22  BUN 21 23  CREATININE 1.41* 1.10*  CALCIUM 8.9 7.6*  PROT 7.8  --   BILITOT 0.9  --   ALKPHOS 82  --   ALT 20  --   AST 37  --   GLUCOSE 256* 127*    Lab Results  Component Value Date   CKTOTAL 370 (H) 01/25/2022     Lab Results  Component Value Date   CHOL 195 01/11/2022   CHOL 166 05/10/2020   CHOL 103 06/17/2018   Lab Results  Component Value Date   HDL 45 01/11/2022   HDL 51 05/10/2020   HDL 37 (L) 06/17/2018   Lab Results  Component Value Date   LDLCALC 102 (H) 01/11/2022   LDLCALC 98 05/10/2020   LDLCALC 45 06/17/2018   Lab Results  Component Value Date   TRIG 281 (H) 01/11/2022   TRIG 84 05/10/2020   TRIG 107 06/17/2018   Lab Results  Component Value Date   CHOLHDL 4.3 01/11/2022   CHOLHDL 3.3 05/10/2020   CHOLHDL 2.8 06/17/2018   No results found for: LDLDIRECT    Radiology: DG Shoulder Right  Result Date: 01/25/2022 CLINICAL DATA:  Fall with right shoulder pain. EXAM: RIGHT SHOULDER - 2+ VIEW  COMPARISON:  None. FINDINGS: There is no evidence of fracture or dislocation. There is no evidence of arthropathy or other focal bone abnormality. Soft tissues are unremarkable. IMPRESSION: Negative. Electronically Signed   By: Misty Stanley M.D.   On: 01/25/2022 08:34   CT HEAD WO CONTRAST (5MM)  Result Date: 01/25/2022 CLINICAL DATA:  Trauma EXAM: CT HEAD WITHOUT CONTRAST TECHNIQUE: Contiguous axial images were obtained from the base of the skull through the vertex without intravenous contrast. RADIATION DOSE REDUCTION: This exam was performed according to the departmental dose-optimization program which includes automated exposure control, adjustment of the mA and/or kV according to patient size and/or use of iterative reconstruction technique. COMPARISON:  None. FINDINGS: Brain: Chronic white matter ischemic change. No evidence of acute infarction, hemorrhage, hydrocephalus, extra-axial collection or mass lesion/mass effect. Vascular: No hyperdense vessel or unexpected calcification. Skull: Normal. Negative for fracture or focal lesion. Sinuses/Orbits: No acute finding. Other: None. IMPRESSION: No acute intracranial abnormality. Electronically Signed   By: Yetta Glassman M.D.   On: 01/25/2022 08:12   DG Chest Portable 1 View  Result Date: 01/25/2022 CLINICAL DATA:  SOB EXAM: PORTABLE CHEST 1 VIEW COMPARISON:  Earlier same day FINDINGS: Stable appearance of the cardiomediastinal silhouette. No pleural effusion. No pneumothorax. Diffuse increase in prominent interstitial opacities. Osseous structures are unremarkable. IMPRESSION: Diffuse increase in interstitial opacities compared to radiograph earlier same day, most suggestive of pulmonary edema. No large pleural effusion. Electronically Signed   By: Albin Felling M.D.   On: 01/25/2022 13:04   DG Chest [facility] [address]  Result Date: 01/25/2022 CLINICAL DATA:  Concern for sepsis EXAM: PORTABLE CHEST 1 VIEW COMPARISON:  Chest x-ray dated October 31, 2021  FINDINGS: Cardiac and mediastinal contours are within normal limits. Mild bilateral interstitial opacities. No focal consolidation. No large pleural effusion or pneumothorax. IMPRESSION: Mild bilateral interstitial opacities which are similar to prior and possibly due to pulmonary edema. No focal consolidation to suggest pneumonia. Electronically Signed   By: Yetta Glassman M.D.   On: 01/25/2022 08:56   DG Humerus Right  Result Date: 01/25/2022 CLINICAL DATA:  Fall.  Pain. EXAM: RIGHT HUMERUS - 2+ VIEW COMPARISON:  None. FINDINGS: There is no evidence of fracture or other focal bone lesions. Soft tissues are unremarkable. IMPRESSION: Negative. Electronically Signed   By: Misty Stanley M.D.   On: 01/25/2022 08:34    ECHO 10/31/21   1. Left ventricular ejection fraction, by estimation, is 45 to 50%. The  left ventricle has mildly decreased function. The left ventricle has no  regional wall motion abnormalities. Left ventricular diastolic parameters  were normal.   2. Right ventricular systolic function is normal. The right ventricular  size is normal.   3. The mitral valve is normal in structure. Mild mitral valve  regurgitation. No evidence of mitral stenosis.   4. The aortic valve is normal in structure. Aortic valve regurgitation is  not visualized. No aortic stenosis is present.   5. The inferior vena cava is normal in size with greater than 50%  respiratory variability, suggesting right atrial pressure of 3 mmHg.   TELEMETRY reviewed by me: AF vs sinus tach rate   EKG reviewed by me: Initial EKG at 0721 showed A. fib with a rate of 99, repeat EKG after receiving all the fluids showed A. fib with RVR at 145 at 1300.   ASSESSMENT AND PLAN:  The patient is a 77yoF with PMH significant for paroxysmal atrial fibrillation on amiodarone and Eliquis, CAD with history of NSTEMI 10/12/21 with LHC showing 99% proximal RCA stenosis and was treated medically, HFrEF (10/2021 LVEF 40-45 %), HTN,  hyperlipidemia, type 2 diabetes who presented to [facility] ED the morning of 01/25/22 after a fall, injuring her right arm. She spent the night on the floor of her apartment unable to get up. She is admitted with sepsis likely from a UTI. Cardiology is consulted for assistance with her AF with RVR.   #paroxysmal AF with RVR  #sepsis 2/2 ecoli bacteremia #elevated troponin 2/2 demand ischemia The patient presented with a few day history of fatigue, weakness and passed out and fell last night. She remained on the floor overnight until she was able to call for help this morning. UA positive for leuks, nitrites, bacteria, lactate 3.1, WBC of 30 and she was giving significant volume of 2.5L fluids and more with empiric abx, resulting in flash pulmonary edema, respiratory distress requiring BIPAP and AF with RVR. S/p IV lasix 80 with good output.  Troponin 166-129 likely related to demand ischemia from AF and underlying infection. Pt has underlying CAD but continues to deny chest pain.  -Recommend treat underlying infection, judicious use of IVF -weaned off Bipap, tolerating 2L [place] well -continue amiodarone gtt for now. Will consider adding digoxin tomorrow or adding back metoprolol if her BP remains soft.  -continue IV lasix 40mg  BID x 2 doses. Please monitor I&Os.  -home meds: amiodarone 200mg  daily, 50mg  metop succinate and eliquis 5 BID for stroke risk reduction -chadsvasc ~6   #CAD with Hx NSTEMI 09/2021, medically managed #  HTN #hyperlipidemia -defer heparin gtt -continue Eliquis 5 mg twice daily as above -continue plavix, pravastatin 20mg . LDL 102 and trigs 281. Consider escalation of statin therapy  -hold antihypertensives as remains BP soft - systolic 164W during interview. On lisinopril 5mg , imdur 30, and metop 50mg  at home.  -recommend continuing cardiac rehab at discharge.   This patient's plan of care was discussed and created with Dr. Donnelly Angelica and he is in agreement.  Signed: Tristan Schroeder , PA-C 01/26/2022, 12:34 PM [facility] Cardiology Please [place] 7a-4p M-F.

## 2022-01-26 NOTE — Progress Notes (Addendum)
PROGRESS NOTE    Alison Richardson  DQQ:[id number] DOB: Dec 28, 1932 DOA: 01/25/2022 PCP: Jon Billings, NP  Outpatient Specialists: cardiology    Brief Narrative:   From admission h and p Alison Richardson is a 88 y.o. female with medical history significant for a fib, dm, htn, hypothyroid, PAD, combined systolic/diastolic chf, cad/nstemi, who presents with the above.   Reports about a week of increased urinary frequency/urgency. No dysuria. No fevers. Yesterday felt nauseaus, went to bathroom and vomited. Fell to floor, injuring right arm and shoulder. Was unable to get up and call for help and so spent the night on the floor. No chest pain or dyspnea. No fevers. No diarrhea. No other vomiting.    In the ED was found to be leukycotitic with aki of 1.4 and elevated lactate. Was treated for presumed sepsis with antibiotics and IV fluids.   Shortly prior to my evaluation the patient developed acute shortness of breath and RVR. She was placed on bipap and given 80 IV lasix. CXR shows increase in interstitial opacities compared with cxr on presentation, suggestive of pulmonary edema.    Assessment & Plan:   Principal Problem:   Fall Active Problems:   Paroxysmal atrial fibrillation with rapid ventricular response (HCC)   Vitamin B12 deficiency   Type 2 diabetes mellitus with diabetic neuropathy (HCC)   Benign essential hypertension   Obstructive sleep apnea   Acute cystitis   # Acute cystitis # Sepsis # E. Coli bacteremia One week urinary frequency, one episode vomiting at home, urinalysis suggestive of infection. The likely inciting event for her other hospital problems. Sepsis by leukocytosis, tachycardia, lactic acidosis. S/p fluid resuscitation. GNRs growing in 4/4. Abx started after UA was obtained so though urine culture is neg suspect that to be the source. Symptomatically improving - cont ceftriaxone 2 g qd, abx started 2/2 - f/u sensitivities   # A-fib with rvr # Flash  pulmonary edema # Acute hypoxic respiratory failure Treated for sepsis with liberal fluids in the ED. Developed flash pulmonary edema and a fib with rvr. Treated with bipap and lasix. Cardiology now following. Weaned off bipap, breathing much improved - cont amio gtt - wean [place] o2, currently on 2 L - lasix 40 iv bid - step-down status - con eliquis   # Demand ischemia Trop [id number], no chest pain, likely demand from above processes - monitor   # Hypokalemia K 3.4 - f/u mg - kcl 60 meq once   # T2DM Here normoglycemic - SSI   # Fall With right shoulder/arm pain. X-ray neg for fracture. Normal rom hip without pain. PT reports very limited ROM right shoulder - repeat x-ray right shoulder   # Acute kidney injury 2/2 uti, immobility. Now s/p fluids. Improved to 1.1 today from 1.4 on presentation - monitor   # Neuropathy - home effexor, gabapentin   # Hypothyroid - home levothyroxine   # CAD - home eliquis, plavix, statin     DVT prophylaxis: home eliquis Code Status: dnr Family Communication: daughter updated telephonically 2/3  Level of care: Stepdown, will transfer to progressive Status is: Inpatient Remains inpatient appropriate because: severity of illness            Consultants:  cardiology  Procedures: none  Antimicrobials:  Cefepime>ceftriaxone    Subjective: No pain, no cough, no dyspnea, no chest pain  Objective: Vitals:   01/26/22 0330 01/26/22 0400 01/26/22 0500 01/26/22 0600  BP: 97/60 103/74 102/70 102/66  Pulse:  (!) 106  Resp: 20 (!) 24 19 18   Temp:  98.3 F (36.8 C)  98.1 F (36.7 C)  TempSrc:  Oral  Oral  SpO2:    97%  Weight:      Height:        Intake/Output Summary (Last 24 hours) at 01/26/2022 0838 Last data filed at 01/25/2022 1314 Gross per 24 hour  Intake 1850.75 ml  Output 200 ml  Net 1650.75 ml   Filed Weights   01/25/22 0722  Weight: 80.7 kg    Examination:  General exam: Appears calm and  comfortable  Respiratory system: Clear to auscultation save for rales at bases Cardiovascular system: S1 & S2 heard, RRR. No JVD, murmurs, rubs, gallops or clicks. No pedal edema. Gastrointestinal system: Abdomen is nondistended, soft and nontender. No organomegaly or masses felt. Normal bowel sounds heard. Central nervous system: Alert. No focal neurological deficits. Extremities: Symmetric 5 x 5 power. Skin: No rashes, lesions or ulcers Psychiatry: mildly confused, calm    Data Reviewed: I have personally reviewed following labs and imaging studies  CBC: Recent Labs  Lab 01/25/22 0733 01/26/22 0800  WBC 27.7* 23.5*  HGB 13.7 10.8*  HCT 41.7 33.1*  MCV 88.3 90.7  PLT 277 277   Basic Metabolic Panel: Recent Labs  Lab 01/25/22 0733 01/25/22 0954 01/26/22 0800  NA 138  --  139  K 3.2*  --  3.4*  CL 98  --  108  CO2 27  --  22  GLUCOSE 256*  --  127*  BUN 21  --  23  CREATININE 1.41*  --  1.10*  CALCIUM 8.9  --  7.6*  MG  --  1.7  --    GFR: Estimated Creatinine Clearance: 39.4 mL/min (A) (by C-G formula based on SCr of 1.1 mg/dL (H)). Liver Function Tests: Recent Labs  Lab 01/25/22 0733  AST 37  ALT 20  ALKPHOS 82  BILITOT 0.9  PROT 7.8  ALBUMIN 3.9   No results for input(s): LIPASE, AMYLASE in the last 168 hours. No results for input(s): AMMONIA in the last 168 hours. Coagulation Profile: Recent Labs  Lab 01/25/22 0848  INR 1.5*   Cardiac Enzymes: Recent Labs  Lab 01/25/22 0742  CKTOTAL 370*   BNP (last 3 results) No results for input(s): PROBNP in the last 8760 hours. HbA1C: No results for input(s): HGBA1C in the last 72 hours. CBG: Recent Labs  Lab 01/25/22 2141 01/26/22 0618 01/26/22 0722  GLUCAP 251* 103* 122*   Lipid Profile: No results for input(s): CHOL, HDL, LDLCALC, TRIG, CHOLHDL, LDLDIRECT in the last 72 hours. Thyroid Function Tests: No results for input(s): TSH, T4TOTAL, FREET4, T3FREE, THYROIDAB in the last 72  hours. Anemia Panel: No results for input(s): VITAMINB12, FOLATE, FERRITIN, TIBC, IRON, RETICCTPCT in the last 72 hours. Urine analysis:    Component Value Date/Time   COLORURINE YELLOW 01/25/2022 1043   APPEARANCEUR CLEAR 01/25/2022 1043   LABSPEC 1.015 01/25/2022 1043   PHURINE 5.0 01/25/2022 1043   GLUCOSEU 250 (A) 01/25/2022 1043   HGBUR LARGE (A) 01/25/2022 1043   BILIRUBINUR NEGATIVE 01/25/2022 1043   KETONESUR 15 (A) 01/25/2022 1043   PROTEINUR >300 (A) 01/25/2022 1043   NITRITE POSITIVE (A) 01/25/2022 1043   LEUKOCYTESUR MODERATE (A) 01/25/2022 1043   Sepsis Labs: @LABRCNTIP (procalcitonin:4,lacticidven:4)  ) Recent Results (from the past 240 hour(s))  Resp Panel by RT-PCR (Flu A&B, Covid) Nasopharyngeal Swab     Status: None   Collection Time: 01/25/22  7:33 AM  Specimen: Nasopharyngeal Swab; Nasopharyngeal(NP) swabs in vial transport medium  Result Value Ref Range Status   SARS Coronavirus 2 by RT PCR NEGATIVE NEGATIVE Final    Comment: (NOTE) SARS-CoV-2 target nucleic acids are NOT DETECTED.  The SARS-CoV-2 RNA is generally detectable in upper respiratory specimens during the acute phase of infection. The lowest concentration of SARS-CoV-2 viral copies this assay can detect is 138 copies/mL. A negative result does not preclude SARS-Cov-2 infection and should not be used as the sole basis for treatment or other patient management decisions. A negative result may occur with  improper specimen collection/handling, submission of specimen other than nasopharyngeal swab, presence of viral mutation(s) within the areas targeted by this assay, and inadequate number of viral copies(<138 copies/mL). A negative result must be combined with clinical observations, patient history, and epidemiological information. The expected result is Negative.  Fact Sheet for Patients:  EntrepreneurPulse.com.au  Fact Sheet for Healthcare Providers:   IncredibleEmployment.be  This test is no t yet approved or cleared by the [place] FDA and  has been authorized for detection and/or diagnosis of SARS-CoV-2 by FDA under an Emergency Use Authorization (EUA). This EUA will remain  in effect (meaning this test can be used) for the duration of the COVID-19 declaration under Section 564(b)(1) of the Act, 21 U.S.C.section 360bbb-3(b)(1), unless the authorization is terminated  or revoked sooner.       Influenza A by PCR NEGATIVE NEGATIVE Final   Influenza B by PCR NEGATIVE NEGATIVE Final    Comment: (NOTE) The Xpert Xpress SARS-CoV-2/FLU/RSV plus assay is intended as an aid in the diagnosis of influenza from Nasopharyngeal swab specimens and should not be used as a sole basis for treatment. Nasal washings and aspirates are unacceptable for Xpert Xpress SARS-CoV-2/FLU/RSV testing.  Fact Sheet for Patients: EntrepreneurPulse.com.au  Fact Sheet for Healthcare Providers: IncredibleEmployment.be  This test is not yet approved or cleared by the [place] FDA and has been authorized for detection and/or diagnosis of SARS-CoV-2 by FDA under an Emergency Use Authorization (EUA). This EUA will remain in effect (meaning this test can be used) for the duration of the COVID-19 declaration under Section 564(b)(1) of the Act, 21 U.S.C. section 360bbb-3(b)(1), unless the authorization is terminated or revoked.  Performed at [facility], [address] [place], [place] [address]   Blood Culture (routine x 2)     Status: Abnormal (Preliminary result)   Collection Time: 01/25/22  8:38 AM   Specimen: BLOOD  Result Value Ref Range Status   Specimen Description   Final    BLOOD BLOOD RIGHT FOREARM Performed at [facility], [address] [place], [place] [address]    Special Requests   Final    BOTTLES DRAWN AEROBIC AND ANAEROBIC Blood Culture results may not  be optimal due to an inadequate volume of blood received in culture bottles Performed at [facility], [address], [place], [place] [address]    Culture  Setup Time   Final    GRAM NEGATIVE RODS IN BOTH AEROBIC AND ANAEROBIC BOTTLES Organism ID to follow CRITICAL RESULT CALLED TO, READ BACK BY AND VERIFIED WITH: RODNEY GRUBB 01/25/22 2040 MU    Culture (A)  Final    ESCHERICHIA COLI SUSCEPTIBILITIES TO FOLLOW Performed at [facility], [place] [address] [place], [place] [address]    Report Status PENDING  Incomplete  Blood Culture ID Panel (Reflexed)     Status: Abnormal   Collection Time: 01/25/22  8:38  AM  Result Value Ref Range Status   Enterococcus faecalis NOT DETECTED NOT DETECTED Final   Enterococcus Faecium NOT DETECTED NOT DETECTED Final   Listeria monocytogenes NOT DETECTED NOT DETECTED Final   Staphylococcus species NOT DETECTED NOT DETECTED Final   Staphylococcus aureus (BCID) NOT DETECTED NOT DETECTED Final   Staphylococcus epidermidis NOT DETECTED NOT DETECTED Final   Staphylococcus lugdunensis NOT DETECTED NOT DETECTED Final   Streptococcus species NOT DETECTED NOT DETECTED Final   Streptococcus agalactiae NOT DETECTED NOT DETECTED Final   Streptococcus pneumoniae NOT DETECTED NOT DETECTED Final   Streptococcus pyogenes NOT DETECTED NOT DETECTED Final   A.calcoaceticus-baumannii NOT DETECTED NOT DETECTED Final   Bacteroides fragilis NOT DETECTED NOT DETECTED Final   Enterobacterales DETECTED (A) NOT DETECTED Final    Comment: Enterobacterales represent a large order of gram negative bacteria, not a single organism. CRITICAL RESULT CALLED TO, READ BACK BY AND VERIFIED WITH: RODNEY GRUBB 01/25/22 2040 MU    Enterobacter cloacae complex NOT DETECTED NOT DETECTED Final   Escherichia coli DETECTED (A) NOT DETECTED Final    Comment: CRITICAL RESULT CALLED TO, READ BACK BY AND VERIFIED WITH: RODNEY GRUBB 01/25/22 2040 MU    Klebsiella aerogenes NOT  DETECTED NOT DETECTED Final   Klebsiella oxytoca NOT DETECTED NOT DETECTED Final   Klebsiella pneumoniae NOT DETECTED NOT DETECTED Final   Proteus species NOT DETECTED NOT DETECTED Final   Salmonella species NOT DETECTED NOT DETECTED Final   Serratia marcescens NOT DETECTED NOT DETECTED Final   Haemophilus influenzae NOT DETECTED NOT DETECTED Final   Neisseria meningitidis NOT DETECTED NOT DETECTED Final   Pseudomonas aeruginosa NOT DETECTED NOT DETECTED Final   Stenotrophomonas maltophilia NOT DETECTED NOT DETECTED Final   Candida albicans NOT DETECTED NOT DETECTED Final   Candida auris NOT DETECTED NOT DETECTED Final   Candida glabrata NOT DETECTED NOT DETECTED Final   Candida krusei NOT DETECTED NOT DETECTED Final   Candida parapsilosis NOT DETECTED NOT DETECTED Final   Candida tropicalis NOT DETECTED NOT DETECTED Final   Cryptococcus neoformans/gattii NOT DETECTED NOT DETECTED Final   CTX-M ESBL NOT DETECTED NOT DETECTED Final   Carbapenem resistance IMP NOT DETECTED NOT DETECTED Final   Carbapenem resistance KPC NOT DETECTED NOT DETECTED Final   Carbapenem resistance NDM NOT DETECTED NOT DETECTED Final   Carbapenem resist OXA 48 LIKE NOT DETECTED NOT DETECTED Final   Carbapenem resistance VIM NOT DETECTED NOT DETECTED Final    Comment: Performed at [facility], [address] [place], [place] [address]  Blood Culture (routine x 2)     Status: None (Preliminary result)   Collection Time: 01/25/22  8:49 AM   Specimen: BLOOD  Result Value Ref Range Status   Specimen Description   Final    BLOOD BLOOD LEFT FOREARM Performed at [facility], [address] [place], [place] [address]    Special Requests   Final    BOTTLES DRAWN AEROBIC AND ANAEROBIC Blood Culture results may not be optimal due to an inadequate volume of blood received in culture bottles Performed at [facility], [address] [place], [place] [address]    Culture  Setup Time    Final    GRAM NEGATIVE RODS IN BOTH AEROBIC AND ANAEROBIC BOTTLES CRITICAL VALUE NOTED.  VALUE IS CONSISTENT WITH PREVIOUSLY REPORTED AND CALLED VALUE.    Culture GRAM NEGATIVE RODS  Final   Report Status PENDING  Incomplete  Urine Culture     Status: None  Collection Time: 01/25/22 10:43 AM   Specimen: In/Out Cath Urine  Result Value Ref Range Status   Specimen Description   Final    IN/OUT CATH URINE Performed at [facility], [address], [place], [place] [address]    Special Requests   Final    NONE Performed at [facility], [address], [place], [place] [address]    Culture   Final    NO GROWTH Performed at [facility], [place] [address], [place], [place] [address]    Report Status 01/26/2022 FINAL  Final  MRSA Next Gen by PCR, Nasal     Status: None   Collection Time: 01/26/22  6:45 AM   Specimen: Nasal Mucosa; Nasal Swab  Result Value Ref Range Status   MRSA by PCR Next Gen NOT DETECTED NOT DETECTED Final    Comment: (NOTE) The GeneXpert MRSA Assay (FDA approved for NASAL specimens only), is one component of a comprehensive MRSA colonization surveillance program. It is not intended to diagnose MRSA infection nor to guide or monitor treatment for MRSA infections. Test performance is not FDA approved in patients less than 25 years old. Performed at [facility], [address], [place],  [address]          Radiology Studies: DG Shoulder Right  Result Date: 01/25/2022 CLINICAL DATA:  Fall with right shoulder pain. EXAM: RIGHT SHOULDER - 2+ VIEW COMPARISON:  None. FINDINGS: There is no evidence of fracture or dislocation. There is no evidence of arthropathy or other focal bone abnormality. Soft tissues are unremarkable. IMPRESSION: Negative. Electronically Signed   By: Misty [place] M.D.   On: 01/25/2022 08:34   CT HEAD WO CONTRAST (5MM)  Result Date: 01/25/2022 CLINICAL DATA:  Trauma EXAM: CT HEAD WITHOUT CONTRAST  TECHNIQUE: Contiguous axial images were obtained from the base of the skull through the vertex without intravenous contrast. RADIATION DOSE REDUCTION: This exam was performed according to the departmental dose-optimization program which includes automated exposure control, adjustment of the mA and/or kV according to patient size and/or use of iterative reconstruction technique. COMPARISON:  None. FINDINGS: Brain: Chronic white matter ischemic change. No evidence of acute infarction, hemorrhage, hydrocephalus, extra-axial collection or mass lesion/mass effect. Vascular: No hyperdense vessel or unexpected calcification. Skull: Normal. Negative for fracture or focal lesion. Sinuses/Orbits: No acute finding. Other: None. IMPRESSION: No acute intracranial abnormality. Electronically Signed   By: Yetta Glassman M.D.   On: 01/25/2022 08:12   DG Chest Portable 1 View  Result Date: 01/25/2022 CLINICAL DATA:  SOB EXAM: PORTABLE CHEST 1 VIEW COMPARISON:  Earlier same day FINDINGS: Stable appearance of the cardiomediastinal silhouette. No pleural effusion. No pneumothorax. Diffuse increase in prominent interstitial opacities. Osseous structures are unremarkable. IMPRESSION: Diffuse increase in interstitial opacities compared to radiograph earlier same day, most suggestive of pulmonary edema. No large pleural effusion. Electronically Signed   By: Albin Felling M.D.   On: 01/25/2022 13:04   DG Chest Port 1 View  Result Date: 01/25/2022 CLINICAL DATA:  Concern for sepsis EXAM: PORTABLE CHEST 1 VIEW COMPARISON:  Chest x-ray dated October 31, 2021 FINDINGS: Cardiac and mediastinal contours are within normal limits. Mild bilateral interstitial opacities. No focal consolidation. No large pleural effusion or pneumothorax. IMPRESSION: Mild bilateral interstitial opacities which are similar to prior and possibly due to pulmonary edema. No focal consolidation to suggest pneumonia. Electronically Signed   By: Yetta Glassman M.D.    On: 01/25/2022 08:56   DG Humerus Right  Result Date: 01/25/2022 CLINICAL DATA:  Fall.  Pain. EXAM: RIGHT HUMERUS - 2+ VIEW COMPARISON:  None. FINDINGS: There is no evidence of fracture or other focal bone lesions. Soft tissues are unremarkable. IMPRESSION: Negative. Electronically Signed   By: Misty [place] M.D.   On: 01/25/2022 08:34        Scheduled Meds:  apixaban  5 mg Oral BID   Chlorhexidine Gluconate Cloth  6 each Topical Q0600   clopidogrel  75 mg Oral Daily   donepezil  10 mg Oral QHS   furosemide  40 mg Intravenous BID   gabapentin  100 mg Oral BID   insulin aspart  0-15 Units Subcutaneous TID WC   levothyroxine  88 mcg Oral Daily   potassium chloride  60 mEq Oral Once   pravastatin  20 mg Oral QHS   venlafaxine XR  75 mg Oral Q breakfast   Continuous Infusions:  amiodarone 30 mg/hr (01/26/22 0327)   cefTRIAXone (ROCEPHIN)  IV 2 g (01/26/22 0830)     LOS: 1 day    Time spent: 74 min    Desma Maxim, MD [facility]   If 7PM-7AM, please contact night-coverage [email or web address] Password [facility] 01/26/2022, 8:38 AM

## 2022-01-26 NOTE — Progress Notes (Signed)
OT Cancellation Note  Patient Details Name: Alison Richardson MRN: [id number] DOB: 1933/05/28   Cancelled Treatment:    Reason Eval/Treat Not Completed: Medical issues which prohibited therapy. OT order received and chart reviewed. Pt's resting heart rate is 136-139 while supine in bed and contraindicated for therapeutic intervention. Pt also has additional imaging scheduled for R shoulder today. OT to re-attempt when pt is medically able to actively participate in OT intervention .  Darleen Crocker, [place], OTR/L , CBIS ascom [phone]  01/26/22, 2:55 PM

## 2022-01-26 NOTE — Evaluation (Addendum)
Physical Therapy Evaluation Patient Details Name: Alison Richardson MRN: [id number] DOB: [birth year] Today's Date: 01/26/2022  History of Present Illness  Pt is an 88 y/o F admitted on 01/25/22 after presenting with c/o increased urinary frequency/urgency x 1 week. 1 episode of N&V & fall to the floor, injuring RUE & shoulder, unable to get up so spent the night on the floor. Pt was tx for presumed sepsis & developed acute SOB & RVR & was initially placed on bipap. CXR shows increase in interstitial opacities compared with cxr on presentation, suggestive of pulmonary edema. RUE shoulder/arm x-ray negative for fx. PMH: a-fib, DM, HTN, hypothyroid, PAD, combined systolic/diastolic CHF, CAD, NSTEMI  Clinical Impression  Pt received in bed & agreeable to tx. Pt reports prior to admission she was residing in [place] apartment at [place] on the 5th floor with elevator access. Pt primarily uses QC but started using rollator 2-3 days prior to admission 2/2 feeling "unsteady". Pt reports she has a life alert button but wasn't wearing it when she fell because she did not like the way it looked & she removed it to go have lunch prior to fall. On this date, pt presents with very limited R shoulder ROM but is able to complete bed mobility without physical assistance. Pt sat EOB ~5 minutes & performs scooting to L of EOB with supervision. Standing/gait attempts deferred at this time 2/2 HR increasing to a max of 150 bpm sitting EOB (lowest was 113 bpm upon PT arrival). Pt reports she doesn't have anyone to assist her at home upon d/c. Pt would have difficulty caring for herself at this time, especially performing ADL's, 2/2 R shoulder limitations. Pt would benefit from STR upon d/c to maximize independence with mobility & decrease fall risk prior to return home alone to ILF apartment.    SpO2 >90% on 2L/min via nasal cannula    Recommendations for follow up therapy are one component of a multi-disciplinary  discharge planning process, led by the attending physician.  Recommendations may be updated based on patient status, additional functional criteria and insurance authorization.  Follow Up Recommendations Skilled nursing-short term rehab (<3 hours/day)    Assistance Recommended at Discharge Frequent or constant Supervision/Assistance  Patient can return home with the following  A lot of help with walking and/or transfers;A lot of help with bathing/dressing/bathroom;Assistance with cooking/housework;Direct supervision/assist for financial management;Assist for transportation    Equipment Recommendations Rolling walker (2 wheels)  Recommendations for Other Services    OT consult   Functional Status Assessment Patient has had a recent decline in their functional status and demonstrates the ability to make significant improvements in function in a reasonable and predictable amount of time.     Precautions / Restrictions Precautions Precautions: Fall Precaution Comments: limited R shoulder movement 2/2 weakness/pain Restrictions Weight Bearing Restrictions: No      Mobility  Bed Mobility Overal bed mobility: Modified Independent             General bed mobility comments: supine<>sit with HOB slightly elevated, bed rails PRN, slightly extra time to elevate BLE onto bed    Transfers Overall transfer level:  (deferred 2/2 HR)                      Ambulation/Gait                  Stairs            Wheelchair Mobility  Modified Rankin (Stroke Patients Only)       Balance Overall balance assessment: Needs assistance Sitting-balance support: Feet supported, No upper extremity supported Sitting balance-Leahy Scale: Good Sitting balance - Comments: Static sitting balance with mod I, able to scoot to L along EOB with supervision without overt LOB                                     Pertinent Vitals/Pain Pain Assessment Pain  Assessment: Faces Faces Pain Scale: Hurts little more Pain Location: R shoulder when attempting to flex Pain Descriptors / Indicators: Discomfort Pain Intervention(s): Limited activity within patient's tolerance, Monitored during session    Home Living Family/patient expects to be discharged to:: Skilled nursing facility Living Arrangements: Alone                 Additional Comments: [place] apartment on the 5th floor with elevator access    Prior Function Prior Level of Function : Independent/Modified Independent             Mobility Comments: mod I with QC but began using rollator 2-3 days prior to admission 2/2 feeling "unsteady"       Hand Dominance        Extremity/Trunk Assessment   Upper Extremity Assessment Upper Extremity Assessment: Defer to OT evaluation (minimal R shoulder flexion 2/2 weakness/pain)    Lower Extremity Assessment Lower Extremity Assessment: Generalized weakness    Cervical / Trunk Assessment Cervical / Trunk Assessment: Normal  Communication   Communication: HOH  Cognition Arousal/Alertness: Awake/alert Behavior During Therapy: WFL for tasks assessed/performed Overall Cognitive Status: Within Functional Limits for tasks assessed                                 General Comments: Pleasant lady        General Comments      Exercises     Assessment/Plan    PT Assessment Patient needs continued PT services  PT Problem List Decreased strength;Decreased mobility;Decreased safety awareness;Decreased range of motion;Decreased coordination;Decreased activity tolerance;Cardiopulmonary status limiting activity;Pain;Decreased balance;Decreased knowledge of use of DME       PT Treatment Interventions Therapeutic exercise;DME instruction;Gait training;Balance training;Stair training;Neuromuscular re-education;Functional mobility training;Patient/family education;Therapeutic activities;Modalities    PT  Goals (Current goals can be found in the Care Plan section)  Acute Rehab PT Goals Patient Stated Goal: get better, go to rehab at [facility] PT Goal Formulation: With patient/family Time For Goal Achievement: 02/09/22 Potential to Achieve Goals: Good    Frequency Min 2X/week     Co-evaluation               AM-PAC PT "6 Clicks" Mobility  Outcome Measure Help needed turning from your back to your side while in a flat bed without using bedrails?: None Help needed moving from lying on your back to sitting on the side of a flat bed without using bedrails?: None Help needed moving to and from a bed to a chair (including a wheelchair)?: A Little Help needed standing up from a chair using your arms (e.g., wheelchair or bedside chair)?: A Little Help needed to walk in hospital room?: A Lot Help needed climbing 3-5 steps with a railing? : A Lot 6 Click Score: 18    End of Session Equipment Utilized During Treatment: Oxygen Activity Tolerance: Treatment limited secondary to medical  complications (Comment) (limited by elevated HR) Patient left: in bed;with call bell/phone within reach;with bed alarm set;with family/visitor present Nurse Communication: Mobility status (HR) PT Visit Diagnosis: Difficulty in walking, not elsewhere classified (R26.2);Pain;Muscle weakness (generalized) (M62.81);Unsteadiness on feet (R26.81) Pain - Right/Left: Right Pain - part of body: Shoulder    Time: 7670-1100 PT Time Calculation (min) (ACUTE ONLY): 14 min   Charges:   PT Evaluation $PT Eval Moderate Complexity: [place], PT, DPT 01/26/22, 2:33 PM   Waunita Schooner 01/26/2022, 2:29 PM

## 2022-01-27 DIAGNOSIS — R112 Nausea with vomiting, unspecified: Secondary | ICD-10-CM | POA: Diagnosis not present

## 2022-01-27 DIAGNOSIS — W19XXXA Unspecified fall, initial encounter: Secondary | ICD-10-CM | POA: Diagnosis not present

## 2022-01-27 LAB — CBC
HCT: 35.1 % — ABNORMAL LOW (ref 36.0–46.0)
Hemoglobin: 11.6 g/dL — ABNORMAL LOW (ref 12.0–15.0)
MCH: 29 pg (ref 26.0–34.0)
MCHC: 33 g/dL (ref 30.0–36.0)
MCV: 87.8 fL (ref 80.0–100.0)
Platelets: 228 10*3/uL (ref 150–400)
RBC: 4 MIL/uL (ref 3.87–5.11)
RDW: 13.4 % (ref 11.5–15.5)
WBC: 17.3 10*3/uL — ABNORMAL HIGH (ref 4.0–10.5)
nRBC: 0 % (ref 0.0–0.2)

## 2022-01-27 LAB — BASIC METABOLIC PANEL
Anion gap: 7 (ref 5–15)
BUN: 18 mg/dL (ref 8–23)
CO2: 25 mmol/L (ref 22–32)
Calcium: 8.3 mg/dL — ABNORMAL LOW (ref 8.9–10.3)
Chloride: 106 mmol/L (ref 98–111)
Creatinine, Ser: 0.74 mg/dL (ref 0.44–1.00)
GFR, Estimated: >60 mL/min (ref >60)
Glucose, Bld: 162 mg/dL — ABNORMAL HIGH (ref 70–99)
Potassium: 3.7 mmol/L (ref 3.5–5.1)
Sodium: 138 mmol/L (ref 135–145)

## 2022-01-27 LAB — CULTURE, BLOOD (ROUTINE X 2)

## 2022-01-27 LAB — GLUCOSE, CAPILLARY
Glucose-Capillary: 170 mg/dL — ABNORMAL HIGH (ref 70–99)
Glucose-Capillary: 204 mg/dL — ABNORMAL HIGH (ref 70–99)
Glucose-Capillary: 174 mg/dL — ABNORMAL HIGH (ref 70–99)
Glucose-Capillary: 265 mg/dL — ABNORMAL HIGH (ref 70–99)

## 2022-01-27 LAB — LACTIC ACID, PLASMA: Lactic Acid, Venous: 1.1 mmol/L (ref 0.5–1.9)

## 2022-01-27 LAB — MAGNESIUM: Magnesium: 2.1 mg/dL (ref 1.7–2.4)

## 2022-01-27 MED ORDER — METOPROLOL TARTRATE 25 MG PO TABS
12.5000 mg | ORAL_TABLET | Freq: Two times a day (BID) | ORAL | Status: DC
  Administered 2022-01-27: 12.5 mg via ORAL
  Filled 2022-01-27: qty 1

## 2022-01-27 MED ORDER — BUPIVACAINE HCL (PF) 0.5 % IJ SOLN
10.0000 mL | Freq: Once | INTRAMUSCULAR | Status: AC
  Administered 2022-01-27: 10 mL via INTRA_ARTICULAR
  Filled 2022-01-27: qty 10

## 2022-01-27 MED ORDER — TRIAMCINOLONE ACETONIDE 40 MG/ML IJ SUSP
40.0000 mg | Freq: Once | INTRAMUSCULAR | Status: AC
  Administered 2022-01-27: 40 mg via INTRA_ARTICULAR
  Filled 2022-01-27: qty 1

## 2022-01-27 MED ORDER — FUROSEMIDE 40 MG PO TABS
40.0000 mg | ORAL_TABLET | Freq: Every day | ORAL | Status: DC
  Administered 2022-01-27 – 2022-01-30 (×4): 40 mg via ORAL
  Filled 2022-01-27 (×4): qty 1

## 2022-01-27 MED ORDER — METOPROLOL TARTRATE 25 MG PO TABS
25.0000 mg | ORAL_TABLET | Freq: Two times a day (BID) | ORAL | Status: DC
  Administered 2022-01-27: 25 mg via ORAL
  Filled 2022-01-27: qty 1

## 2022-01-27 MED ORDER — CEPHALEXIN 500 MG PO CAPS
500.0000 mg | ORAL_CAPSULE | Freq: Four times a day (QID) | ORAL | Status: DC
  Administered 2022-01-28 – 2022-01-30 (×9): 500 mg via ORAL
  Filled 2022-01-27 (×9): qty 1

## 2022-01-27 MED ORDER — INSULIN ASPART 100 UNIT/ML IJ SOLN
0.0000 [IU] | Freq: Three times a day (TID) | INTRAMUSCULAR | Status: DC
  Administered 2022-01-27: 8 [IU] via SUBCUTANEOUS
  Administered 2022-01-28: 3 [IU] via SUBCUTANEOUS
  Administered 2022-01-28: 5 [IU] via SUBCUTANEOUS
  Administered 2022-01-28 (×2): 3 [IU] via SUBCUTANEOUS
  Administered 2022-01-29: 15 [IU] via SUBCUTANEOUS
  Administered 2022-01-29: 3 [IU] via SUBCUTANEOUS
  Filled 2022-01-27 (×7): qty 1

## 2022-01-27 MED ORDER — AMIODARONE HCL 200 MG PO TABS
400.0000 mg | ORAL_TABLET | Freq: Two times a day (BID) | ORAL | Status: AC
  Administered 2022-01-27 – 2022-01-29 (×6): 400 mg via ORAL
  Filled 2022-01-27 (×6): qty 2

## 2022-01-27 MED ORDER — AMIODARONE HCL 200 MG PO TABS
200.0000 mg | ORAL_TABLET | Freq: Every day | ORAL | Status: DC
  Administered 2022-01-30: 200 mg via ORAL
  Filled 2022-01-27 (×2): qty 1

## 2022-01-27 NOTE — Progress Notes (Signed)
PROGRESS NOTE    Alison Richardson  OZH:[id number] DOB: 14-Oct-1933 DOA: 01/25/2022 PCP: Jon Billings, NP  Outpatient Specialists: cardiology    Brief Narrative:   From admission h and p Alison Richardson is a 88 y.o. female with medical history significant for a fib, dm, htn, hypothyroid, PAD, combined systolic/diastolic chf, cad/nstemi, who presents with the above.   Reports about a week of increased urinary frequency/urgency. No dysuria. No fevers. Yesterday felt nauseaus, went to bathroom and vomited. Fell to floor, injuring right arm and shoulder. Was unable to get up and call for help and so spent the night on the floor. No chest pain or dyspnea. No fevers. No diarrhea. No other vomiting.    In the ED was found to be leukycotitic with aki of 1.4 and elevated lactate. Was treated for presumed sepsis with antibiotics and IV fluids.   Shortly prior to my evaluation the patient developed acute shortness of breath and RVR. She was placed on bipap and given 80 IV lasix. CXR shows increase in interstitial opacities compared with cxr on presentation, suggestive of pulmonary edema.    Assessment & Plan:   Principal Problem:   Fall Active Problems:   Paroxysmal atrial fibrillation with rapid ventricular response (HCC)   Vitamin B12 deficiency   Type 2 diabetes mellitus with diabetic neuropathy (HCC)   Benign essential hypertension   Obstructive sleep apnea   Acute cystitis   # Acute cystitis # Sepsis # E. Coli bacteremia One week urinary frequency, one episode vomiting at home, urinalysis suggestive of infection. The likely inciting event for her other hospital problems. Sepsis by leukocytosis, tachycardia, lactic acidosis. S/p fluid resuscitation. GNRs growing in 4/4. Abx started after UA was obtained so though urine culture is neg suspect that to be the source. Symptomatically improved. Cultures pan-sensitive - stop ceftriaxone (started 2/2), discussed w/ pharmacy, will start keflex  500 q6, plan for 7 day course   # A-fib with rvr # Flash pulmonary edema # Acute hypoxic respiratory failure Treated for sepsis with liberal fluids in the ED. Developed flash pulmonary edema and a fib with rvr. Treated with bipap and lasix. Cardiology now following. Weaned off bipap and now off o2. Amio gtt discontinued today - plan for amio is 400 bid for 3 days, then 200 qd - po lasix 40 - metoprolol added back at 12.5 bid (from home 50 xl) - con eliquis   # Demand ischemia Trop 629>528, no chest pain, likely demand from above processes - monitor   # Hypokalemia Resolved w/ supplementation - monitor   # T2DM Here normoglycemic - SSI   # Fall With right shoulder/arm pain. X-ray neg for fracture. Normal rom hip without pain. Unable to abduct arm, in pain, family very concerned - will ask ortho to eval - PT advising SNF, toc aware   # Acute kidney injury 2/2 uti, immobility. Resolved w/ fluids - monitor   # Neuropathy - home effexor, gabapentin   # Hypothyroid - home levothyroxine   # CAD - home eliquis, plavix, statin     DVT prophylaxis: home eliquis Code Status: dnr Family Communication: daughter & son updated @ bedside 2/4  Level of care: Progressive Status is: Inpatient Remains inpatient appropriate because: severity of illness            Consultants:  cardiology  Procedures: none  Antimicrobials:  Cefepime>ceftriaxone>keflex   Subjective: Ongoing right shoulder pain, no other pain, no sob or cough  Objective: Vitals:   01/27/22 0035 01/27/22  0344 01/27/22 0700 01/27/22 1137  BP: 116/70 106/69 117/80 117/86  Pulse: (!) 119 (!) 108 (!) 104 (!) 102  Resp: 19 18 (!) 21 18  Temp: 98.1 F (36.7 C) 98.7 F (37.1 C) 97.9 F (36.6 C) 97.7 F (36.5 C)  TempSrc: Oral  Oral Oral  SpO2: 94% 95% 94% 96%  Weight:      Height:        Intake/Output Summary (Last 24 hours) at 01/27/2022 1409 Last data filed at 01/27/2022 1406 Gross per 24  hour  Intake 786.38 ml  Output 2000 ml  Net -1213.62 ml   Filed Weights   01/25/22 0722  Weight: 80.7 kg    Examination:  General exam: Appears calm and comfortable  Respiratory system: Clear to auscultation save for rales at bases Cardiovascular system: S1 & S2 heard, RRR. No JVD, murmurs, rubs, gallops or clicks. No pedal edema. Gastrointestinal system: Abdomen is nondistended, soft and nontender. No organomegaly or masses felt. Normal bowel sounds heard. Central nervous system: Alert. No focal neurological deficits. MSK: unable to abduct right shoulder above 90 Skin: No rashes, lesions or ulcers Psychiatry: mildly confused, calm    Data Reviewed: I have personally reviewed following labs and imaging studies  CBC: Recent Labs  Lab 01/25/22 0733 01/26/22 0800 01/27/22 0446  WBC 27.7* 23.5* 17.3*  HGB 13.7 10.8* 11.6*  HCT 41.7 33.1* 35.1*  MCV 88.3 90.7 87.8  PLT 277 206 161   Basic Metabolic Panel: Recent Labs  Lab 01/25/22 0733 01/25/22 0954 01/26/22 0800 01/26/22 0928 01/27/22 0446  NA 138  --  139  --  138  K 3.2*  --  3.4*  --  3.7  CL 98  --  108  --  106  CO2 27  --  22  --  25  GLUCOSE 256*  --  127*  --  162*  BUN 21  --  23  --  18  CREATININE 1.41*  --  1.10*  --  0.74  CALCIUM 8.9  --  7.6*  --  8.3*  MG  --  1.7  --  1.6* 2.1   GFR: Estimated Creatinine Clearance: 54.1 mL/min (by C-G formula based on SCr of 0.74 mg/dL). Liver Function Tests: Recent Labs  Lab 01/25/22 0733  AST 37  ALT 20  ALKPHOS 82  BILITOT 0.9  PROT 7.8  ALBUMIN 3.9   No results for input(s): LIPASE, AMYLASE in the last 168 hours. No results for input(s): AMMONIA in the last 168 hours. Coagulation Profile: Recent Labs  Lab 01/25/22 0848  INR 1.5*   Cardiac Enzymes: Recent Labs  Lab 01/25/22 0742  CKTOTAL 370*   BNP (last 3 results) No results for input(s): PROBNP in the last 8760 hours. HbA1C: No results for input(s): HGBA1C in the last 72  hours. CBG: Recent Labs  Lab 01/26/22 1123 01/26/22 1603 01/26/22 2118 01/27/22 0745 01/27/22 1135  GLUCAP 216* 139* 220* 170* 204*   Lipid Profile: No results for input(s): CHOL, HDL, LDLCALC, TRIG, CHOLHDL, LDLDIRECT in the last 72 hours. Thyroid Function Tests: No results for input(s): TSH, T4TOTAL, FREET4, T3FREE, THYROIDAB in the last 72 hours. Anemia Panel: No results for input(s): VITAMINB12, FOLATE, FERRITIN, TIBC, IRON, RETICCTPCT in the last 72 hours. Urine analysis:    Component Value Date/Time   COLORURINE YELLOW 01/25/2022 1043   APPEARANCEUR CLEAR 01/25/2022 1043   LABSPEC 1.015 01/25/2022 1043   PHURINE 5.0 01/25/2022 1043   GLUCOSEU 250 (A) 01/25/2022 1043  HGBUR LARGE (A) 01/25/2022 1043   BILIRUBINUR NEGATIVE 01/25/2022 1043   KETONESUR 15 (A) 01/25/2022 1043   PROTEINUR >300 (A) 01/25/2022 1043   NITRITE POSITIVE (A) 01/25/2022 1043   LEUKOCYTESUR MODERATE (A) 01/25/2022 1043   Sepsis Labs: @LABRCNTIP (procalcitonin:4,lacticidven:4)  ) Recent Results (from the past 240 hour(s))  Resp Panel by RT-PCR (Flu A&B, Covid) Nasopharyngeal Swab     Status: None   Collection Time: 01/25/22  7:33 AM   Specimen: Nasopharyngeal Swab; Nasopharyngeal(NP) swabs in vial transport medium  Result Value Ref Range Status   SARS Coronavirus 2 by RT PCR NEGATIVE NEGATIVE Final    Comment: (NOTE) SARS-CoV-2 target nucleic acids are NOT DETECTED.  The SARS-CoV-2 RNA is generally detectable in upper respiratory specimens during the acute phase of infection. The lowest concentration of SARS-CoV-2 viral copies this assay can detect is 138 copies/mL. A negative result does not preclude SARS-Cov-2 infection and should not be used as the sole basis for treatment or other patient management decisions. A negative result may occur with  improper specimen collection/handling, submission of specimen other than nasopharyngeal swab, presence of viral mutation(s) within the areas  targeted by this assay, and inadequate number of viral copies(<138 copies/mL). A negative result must be combined with clinical observations, patient history, and epidemiological information. The expected result is Negative.  Fact Sheet for Patients:  EntrepreneurPulse.com.au  Fact Sheet for Healthcare Providers:  IncredibleEmployment.be  This test is no t yet approved or cleared by the [place] FDA and  has been authorized for detection and/or diagnosis of SARS-CoV-2 by FDA under an Emergency Use Authorization (EUA). This EUA will remain  in effect (meaning this test can be used) for the duration of the COVID-19 declaration under Section 564(b)(1) of the Act, 21 U.S.C.section 360bbb-3(b)(1), unless the authorization is terminated  or revoked sooner.       Influenza A by PCR NEGATIVE NEGATIVE Final   Influenza B by PCR NEGATIVE NEGATIVE Final    Comment: (NOTE) The Xpert Xpress SARS-CoV-2/FLU/RSV plus assay is intended as an aid in the diagnosis of influenza from Nasopharyngeal swab specimens and should not be used as a sole basis for treatment. Nasal washings and aspirates are unacceptable for Xpert Xpress SARS-CoV-2/FLU/RSV testing.  Fact Sheet for Patients: EntrepreneurPulse.com.au  Fact Sheet for Healthcare Providers: IncredibleEmployment.be  This test is not yet approved or cleared by the [place] FDA and has been authorized for detection and/or diagnosis of SARS-CoV-2 by FDA under an Emergency Use Authorization (EUA). This EUA will remain in effect (meaning this test can be used) for the duration of the COVID-19 declaration under Section 564(b)(1) of the Act, 21 U.S.C. section 360bbb-3(b)(1), unless the authorization is terminated or revoked.  Performed at [facility], [address], [place], [place] [address]   Blood Culture (routine x 2)     Status: Abnormal   Collection  Time: 01/25/22  8:38 AM   Specimen: BLOOD  Result Value Ref Range Status   Specimen Description   Final    BLOOD BLOOD RIGHT FOREARM Performed at [facility], [address] [place], [place] [address]    Special Requests   Final    BOTTLES DRAWN AEROBIC AND ANAEROBIC Blood Culture results may not be optimal due to an inadequate volume of blood received in culture bottles Performed at [facility], [address], [place], [place] [address]    Culture  Setup Time   Final    GRAM NEGATIVE RODS IN BOTH AEROBIC  AND ANAEROBIC BOTTLES Organism ID to follow CRITICAL RESULT CALLED TO, READ BACK BY AND VERIFIED WITH: RODNEY GRUBB 01/25/22 2040 MU    Culture ESCHERICHIA COLI (A)  Final   Report Status 01/27/2022 FINAL  Final   Organism ID, Bacteria ESCHERICHIA COLI  Final      Susceptibility   Escherichia coli - MIC*    AMPICILLIN 4 SENSITIVE Sensitive     CEFAZOLIN <=4 SENSITIVE Sensitive     CEFEPIME <=0.12 SENSITIVE Sensitive     CEFTAZIDIME <=1 SENSITIVE Sensitive     CEFTRIAXONE <=0.25 SENSITIVE Sensitive     CIPROFLOXACIN <=0.25 SENSITIVE Sensitive     GENTAMICIN <=1 SENSITIVE Sensitive     IMIPENEM <=0.25 SENSITIVE Sensitive     TRIMETH/SULFA <=20 SENSITIVE Sensitive     AMPICILLIN/SULBACTAM <=2 SENSITIVE Sensitive     PIP/TAZO <=4 SENSITIVE Sensitive     * ESCHERICHIA COLI  Blood Culture ID Panel (Reflexed)     Status: Abnormal   Collection Time: 01/25/22  8:38 AM  Result Value Ref Range Status   Enterococcus faecalis NOT DETECTED NOT DETECTED Final   Enterococcus Faecium NOT DETECTED NOT DETECTED Final   Listeria monocytogenes NOT DETECTED NOT DETECTED Final   Staphylococcus species NOT DETECTED NOT DETECTED Final   Staphylococcus aureus (BCID) NOT DETECTED NOT DETECTED Final   Staphylococcus epidermidis NOT DETECTED NOT DETECTED Final   Staphylococcus lugdunensis NOT DETECTED NOT DETECTED Final   Streptococcus species NOT DETECTED NOT DETECTED Final    Streptococcus agalactiae NOT DETECTED NOT DETECTED Final   Streptococcus pneumoniae NOT DETECTED NOT DETECTED Final   Streptococcus pyogenes NOT DETECTED NOT DETECTED Final   A.calcoaceticus-baumannii NOT DETECTED NOT DETECTED Final   Bacteroides fragilis NOT DETECTED NOT DETECTED Final   Enterobacterales DETECTED (A) NOT DETECTED Final    Comment: Enterobacterales represent a large order of gram negative bacteria, not a single organism. CRITICAL RESULT CALLED TO, READ BACK BY AND VERIFIED WITH: RODNEY GRUBB 01/25/22 2040 MU    Enterobacter cloacae complex NOT DETECTED NOT DETECTED Final   Escherichia coli DETECTED (A) NOT DETECTED Final    Comment: CRITICAL RESULT CALLED TO, READ BACK BY AND VERIFIED WITH: RODNEY GRUBB 01/25/22 2040 MU    Klebsiella aerogenes NOT DETECTED NOT DETECTED Final   Klebsiella oxytoca NOT DETECTED NOT DETECTED Final   Klebsiella pneumoniae NOT DETECTED NOT DETECTED Final   Proteus species NOT DETECTED NOT DETECTED Final   Salmonella species NOT DETECTED NOT DETECTED Final   Serratia marcescens NOT DETECTED NOT DETECTED Final   Haemophilus influenzae NOT DETECTED NOT DETECTED Final   Neisseria meningitidis NOT DETECTED NOT DETECTED Final   Pseudomonas aeruginosa NOT DETECTED NOT DETECTED Final   Stenotrophomonas maltophilia NOT DETECTED NOT DETECTED Final   Candida albicans NOT DETECTED NOT DETECTED Final   Candida auris NOT DETECTED NOT DETECTED Final   Candida glabrata NOT DETECTED NOT DETECTED Final   Candida krusei NOT DETECTED NOT DETECTED Final   Candida parapsilosis NOT DETECTED NOT DETECTED Final   Candida tropicalis NOT DETECTED NOT DETECTED Final   Cryptococcus neoformans/gattii NOT DETECTED NOT DETECTED Final   CTX-M ESBL NOT DETECTED NOT DETECTED Final   Carbapenem resistance IMP NOT DETECTED NOT DETECTED Final   Carbapenem resistance KPC NOT DETECTED NOT DETECTED Final   Carbapenem resistance NDM NOT DETECTED NOT DETECTED Final   Carbapenem  resist OXA 48 LIKE NOT DETECTED NOT DETECTED Final   Carbapenem resistance VIM NOT DETECTED NOT DETECTED Final    Comment: Performed at [facility], [address] [place], [place] [address]  Blood Culture (routine x 2)     Status: Abnormal   Collection Time: 01/25/22  8:49 AM   Specimen: BLOOD  Result Value Ref Range Status   Specimen Description   Final    BLOOD BLOOD LEFT FOREARM Performed at [facility], [address] [place], [place] [address]    Special Requests   Final    BOTTLES DRAWN AEROBIC AND ANAEROBIC Blood Culture results may not be optimal due to an inadequate volume of blood received in culture bottles Performed at [facility], [address], [place], [place] [address]    Culture  Setup Time   Final    GRAM NEGATIVE RODS IN BOTH AEROBIC AND ANAEROBIC BOTTLES CRITICAL VALUE NOTED.  VALUE IS CONSISTENT WITH PREVIOUSLY REPORTED AND CALLED VALUE.    Culture (A)  Final    ESCHERICHIA COLI SUSCEPTIBILITIES PERFORMED ON PREVIOUS CULTURE WITHIN THE LAST 5 DAYS. Performed at [facility], [place] [address], [place], [place] [address]    Report Status 01/27/2022 FINAL  Final  Urine Culture     Status: None   Collection Time: 01/25/22 10:43 AM   Specimen: In/Out Cath Urine  Result Value Ref Range Status   Specimen Description   Final    IN/OUT CATH URINE Performed at [facility], [address] [place], [place] [address]    Special Requests   Final    NONE Performed at [facility], [address], [place], [place] [address]    Culture   Final    NO GROWTH Performed at [facility], [place] [address], [place], [place] [address]    Report Status 01/26/2022 FINAL  Final  MRSA Next Gen by PCR, Nasal     Status: None   Collection Time: 01/26/22  6:45 AM   Specimen: Nasal Mucosa; Nasal Swab  Result Value Ref Range Status   MRSA by PCR Next Gen NOT DETECTED NOT DETECTED Final    Comment: (NOTE) The  GeneXpert MRSA Assay (FDA approved for NASAL specimens only), is one component of a comprehensive MRSA colonization surveillance program. It is not intended to diagnose MRSA infection nor to guide or monitor treatment for MRSA infections. Test performance is not FDA approved in patients less than 86 years old. Performed at [facility], [address], [place],  [address]          Radiology Studies: DG Shoulder Right  Result Date: 01/26/2022 CLINICAL DATA:  Right shoulder pain since fall two days ago. EXAM: RIGHT SHOULDER - 2+ VIEW COMPARISON:  Right shoulder x-rays from yesterday. FINDINGS: No acute fracture or dislocation. Unchanged mild acromioclavicular osteoarthritis. Osteopenia. Soft tissues are unremarkable. IMPRESSION: 1. No acute osseous abnormality. Electronically Signed   By: Titus Dubin M.D.   On: 01/26/2022 15:39        Scheduled Meds:  amiodarone  400 mg Oral BID   Followed by   Derrill Memo ON 01/30/2022] amiodarone  200 mg Oral Daily   apixaban  5 mg Oral BID   [START ON 01/28/2022] cephALEXin  500 mg Oral QID   Chlorhexidine Gluconate Cloth  6 each Topical Q0600   clopidogrel  75 mg Oral Daily   donepezil  10 mg Oral QHS   furosemide  40 mg Oral Daily   gabapentin  100 mg Oral BID   insulin aspart  0-15 Units Subcutaneous TID WC   levothyroxine  88 mcg  Oral Daily   metoprolol tartrate  12.5 mg Oral BID   pravastatin  20 mg Oral QHS   venlafaxine XR  75 mg Oral Q breakfast   Continuous Infusions:     LOS: 2 days    Time spent: 30 min    Desma Maxim, MD [facility]   If 7PM-7AM, please contact night-coverage [email or web address] Password [email or web address] 01/27/2022, 2:09 PM

## 2022-01-27 NOTE — TOC Progression Note (Signed)
Transition of Care [facility]) - Progression Note    Patient Details  Name: Alison Richardson MRN: [id number] Date of Birth: November 07, 1933  Transition of Care [facility]) CM/SW [place], [place] Phone Number: 01/27/2022, 3:42 PM  Clinical Narrative:     CSW notes patient is agreeable to rehab and is recommended by PT.   CSW has informed [facility] and has sent clinicals to [facility] in hub.   Insurance auth through [place] has been started today.    Expected Discharge Plan: [place] Barriers to Discharge: Continued Medical Work up  Expected Discharge Plan and Services Expected Discharge Plan: [place]   Discharge Planning Services: CM Consult Post Acute Care Choice: [place] Living arrangements for the past 2 months: [place]                 DME Arranged: N/A DME Agency: NA                   Social Determinants of Health (SDOH) Interventions    Readmission Risk Interventions Readmission Risk Prevention Plan 01/26/2022 11/02/2021  Transportation Screening Complete Complete  PCP or Specialist Appt within 3-5 Days Complete Complete  HRI or Home Care Consult Complete Complete  Social Work Consult for [place] Planning/Counseling Complete Complete  Palliative Care Screening Not Applicable Not Applicable  Medication Review Press photographer) Complete Complete  Some recent data might be hidden

## 2022-01-27 NOTE — NC FL2 (Signed)
[place] LEVEL OF CARE SCREENING TOOL     IDENTIFICATION  Patient Name: Alison Richardson Birthdate: 1933-02-21 Sex: female Admission Date (Current Location): 01/25/2022  [facility] [place] and [place] Number:  Engineering geologist and Address:  [facility], [address] [place], [place] [address]      Provider Number: [id number]  Attending Physician Name and Address:  Gwynne Edinger, MD  Relative Name and Phone Number:  [name] (daughter) [phone]    Current Level of Care: Hospital Recommended Level of Care: [place] Prior Approval Number:    Date Approved/Denied:   PASRR Number: [id number]  Discharge Plan: SNF    Current Diagnoses: Patient Active Problem List   Diagnosis Date Noted   Fall 01/25/2022   Acute cystitis 01/25/2022   DDD (degenerative disc disease), lumbar    Acute on chronic systolic CHF (congestive heart failure) ([place]) 10/31/2021   HLD (hyperlipidemia) 10/31/2021   PAF (paroxysmal atrial fibrillation) ([place]) 10/31/2021   CAD (coronary artery disease) 10/31/2021   Acute respiratory failure with hypoxia ([place]) 10/31/2021   Acute on chronic diastolic CHF (congestive heart failure) ([place]) 10/31/2021   NSTEMI (non-ST elevated myocardial infarction) ([place]) 10/10/2021   Chronic diastolic CHF (congestive heart failure) ([place]) 10/13/2020   Acute CHF (congestive heart failure) ([place]) 10/07/2020   Elevated troponin 10/07/2020   CHF (congestive heart failure) ([place]) 10/07/2020   Leukocytosis 10/07/2020   DOE (dyspnea on exertion) 05/09/2020   Syncope and collapse 09/30/2019   Syncope 09/29/2019   Cor pulmonale, chronic ([place]) 09/27/2019   Late onset Alzheimer's disease without behavioral disturbance ([place]) 09/23/2019   Obstructive sleep apnea 07/09/2019   Diabetes mellitus type 2 in nonobese ([place]) 01/21/2019   Dyslipidemia 01/21/2019   PSVT (paroxysmal supraventricular tachycardia) ([place]) 01/21/2019   Tenosynovitis of  foot 01/21/2019   PAD (peripheral artery disease) ([place]) 01/06/2019   Preop testing 01/06/2019   DM type 2 with diabetic mixed hyperlipidemia ([place]) 09/08/2018   Peripheral neuropathy, idiopathic 09/08/2018   Anxiety disorder 06/19/2018   Depression, major, single episode, mild ([place]) 06/19/2018   Paroxysmal atrial fibrillation with rapid ventricular response ([place]) 06/19/2018   Atrial flutter ([place]) 06/19/2018   Pure hypercholesterolemia 06/19/2018   Vitamin B12 deficiency 06/19/2018   Insomnia 06/19/2018   Protein-calorie malnutrition ([place]) 06/19/2018   Type 2 diabetes mellitus with diabetic neuropathy ([place]) 06/19/2018   Knee pain 06/19/2018   Cognitive impairment, mild, so stated 06/19/2018   Osteoarthritis of right knee 06/19/2018   Benign essential hypertension 06/19/2018   Hypothyroidism 06/19/2018   Overactive bladder 06/19/2018   Generalized muscle weakness 06/19/2018   Difficulty in walking, not elsewhere classified 06/19/2018   Major depression in remission ([place]) 06/18/2018   Primary osteoarthritis involving multiple joints 06/18/2018   Mixed hyperlipidemia 11/01/2015   Cerebral aneurysm 04/29/2013    Orientation RESPIRATION BLADDER Height & Weight     Self, Time, Situation, Place  Normal Incontinent, External catheter Weight: 178 lb (80.7 kg) Height:  5\' 6"  (167.6 cm)  BEHAVIORAL SYMPTOMS/MOOD NEUROLOGICAL BOWEL NUTRITION STATUS      Continent Diet (see discharge summary)  AMBULATORY STATUS COMMUNICATION OF NEEDS Skin   Extensive Assist Verbally Normal                       Personal Care Assistance Level of Assistance  Bathing, Feeding, Dressing, Total care Bathing Assistance: Limited assistance Feeding assistance: Independent Dressing Assistance: Limited assistance Total Care Assistance: Maximum assistance   Functional Limitations Info  Sight, Hearing, Speech Sight Info: Adequate Hearing Info: Adequate Speech Info: Adequate    SPECIAL CARE FACTORS  FREQUENCY  PT (By licensed PT), OT (By licensed OT)     PT Frequency: min 4x weekly OT Frequency: min 4x weekly            Contractures Contractures Info: Not present    Additional Factors Info  Code Status, Allergies Code Status Info: DNR Allergies Info: Pantoprazole, metformin and related           Current Medications (01/27/2022):  This is the current hospital active medication list Current Facility-Administered Medications  Medication Dose Route Frequency Provider Last Rate Last Admin   amiodarone (PACERONE) tablet 400 mg  400 mg Oral BID Andrez Grime, MD   400 mg at 01/27/22 5170   Followed by   Derrill Memo ON 01/30/2022] amiodarone (PACERONE) tablet 200 mg  200 mg Oral Daily Andrez Grime, MD       apixaban Arne Cleveland) tablet 5 mg  5 mg Oral BID Gwynne Edinger, MD   5 mg at 01/27/22 0906   [START ON 01/28/2022] cephALEXin (KEFLEX) capsule 500 mg  500 mg Oral QID [place], Ailene Rud, MD       Chlorhexidine Gluconate Cloth 2 % PADS 6 each  6 each Topical Q0600 Gwynne Edinger, MD   6 each at 01/26/22 0200   clopidogrel (PLAVIX) tablet 75 mg  75 mg Oral Daily Gwynne Edinger, MD   75 mg at 01/27/22 0174   donepezil (ARICEPT) tablet 10 mg  10 mg Oral QHS Gwynne Edinger, MD   10 mg at 01/27/22 0031   furosemide (LASIX) tablet 40 mg  40 mg Oral Daily Andrez Grime, MD   40 mg at 01/27/22 1232   gabapentin (NEURONTIN) capsule 100 mg  100 mg Oral BID Gwynne Edinger, MD   100 mg at 01/27/22 9449   insulin aspart (novoLOG) injection 0-15 Units  0-15 Units Subcutaneous TID WC Gwynne Edinger, MD   5 Units at 01/27/22 1233   levothyroxine (SYNTHROID) tablet 88 mcg  88 mcg Oral Daily Gwynne Edinger, MD   88 mcg at 01/27/22 0517   metoprolol tartrate (LOPRESSOR) tablet 12.5 mg  12.5 mg Oral BID Andrez Grime, MD   12.5 mg at 01/27/22 6759   pravastatin (PRAVACHOL) tablet 20 mg  20 mg Oral QHS Gwynne Edinger, MD   20 mg at 01/26/22 2239    venlafaxine XR (EFFEXOR-XR) 24 hr capsule 75 mg  75 mg Oral Q breakfast Gwynne Edinger, MD   75 mg at 01/27/22 1638     Discharge Medications: Please see discharge summary for a list of discharge medications.  Relevant Imaging Results:  Relevant Lab Results:   Additional Information GYK:[id number]  Alberteen Sam, LCSW

## 2022-01-27 NOTE — Progress Notes (Signed)
Cross Cover Night time sliding scale ordered

## 2022-01-27 NOTE — Progress Notes (Signed)
[facility] NOTE       Patient ID: Alison Richardson MRN: [id number] DOB/AGE: Aug 08, 1933 88 y.o.  Admit date: 01/25/2022 Referring Physician Dr. Si Raider  Primary Physician Dr. Emily Filbert Primary Cardiologist Dr. Corky Sox  Reason for Consultation AF RVR  HPI: The patient is a 61yoF with PMH significant for paroxysmal atrial fibrillation on amiodarone and Eliquis, CAD with history of NSTEMI 09/2021 with LHC showing 99% proximal RCA stenosis and was treated medically, HFrEF (LVEF 40-45 by LV gram), HTN, hyperlipidemia, type 2 diabetes who presented to [facility] ED the morning of 01/25/22 after a fall, injuring her right arm. She spent the night on the floor of her apartment unable to get up. She is admitted with sepsis from a UTI with E coli bacteremia. Cardiology is consulted for assistance with her AF with RVR.   Interval history: - Feels much better this morning.  - HR improved from 130 to 105-110 this AM - No shortness of breath or chest pain.   Review of systems complete and found to be negative unless listed above     Past Medical History:  Diagnosis Date   Arrhythmia    Atrial flutter ([place]) 01/2018   new onset    Basal cell carcinoma of back    Basal cell carcinoma of lip    Cerebral aneurysm    followed by [facility]   CHF (congestive heart failure) (HCC)    DDD (degenerative disc disease), lumbar    superior plate depression, L3 08/18/2014   Diabetes mellitus type II, controlled ([place])    Diverticulosis    Dysrhythmia    Paroxysmal Supraventricular Tachycardia   Dysthymia    depression   GERD (gastroesophageal reflux disease)    History of meniscal tear    Hyperlipidemia    Hypertension    Hypothyroidism    Late onset Alzheimer's disease with behavioral disturbance ([place])    Mild pulmonary hypertension ([place])    Overactive bladder    Peripheral vascular disease ([place])    Seasonal allergic rhinitis    Sleep apnea     Past Surgical History:  Procedure Laterality  Date   APPENDECTOMY  1946   BASAL CELL CARCINOMA EXCISION     2006 and 2009 removed from back and lip   CARDIOVASCULAR STRESS TEST  2015   nuclear cardiac stress test negative for ischemia - Dr. Satira Sark   CATARACT EXTRACTION, BILATERAL  2006   COLONOSCOPY  2014   COLONOSCOPY N/A 08/19/2020   Procedure: COLONOSCOPY;  Surgeon: Lesly Rubenstein, MD;  Location: [facility] ENDOSCOPY;  Service: Endoscopy;  Laterality: N/A;   ECTOPIC PREGNANCY SURGERY  1957   ESOPHAGOGASTRODUODENOSCOPY N/A 08/19/2020   Procedure: ESOPHAGOGASTRODUODENOSCOPY (EGD);  Surgeon: Lesly Rubenstein, MD;  Location: [facility] ENDOSCOPY;  Service: Endoscopy;  Laterality: N/A;   INJECTION KNEE Left 05/2015   LEFT HEART CATH AND CORONARY ANGIOGRAPHY N/A 10/12/2021   Procedure: LEFT HEART CATH AND CORONARY ANGIOGRAPHY;  Surgeon: Corey Skains, MD;  Location: [facility] CV LAB;  Service: Cardiovascular;  Laterality: N/A;   REPLACEMENT TOTAL KNEE Left 09/06/2016   Dr. Barnet Pall   TONSILLECTOMY AND ADENOIDECTOMY  1953   TOTAL ABDOMINAL HYSTERECTOMY  1986   DU B    Medications Prior to Admission  Medication Sig Dispense Refill Last Dose   amiodarone (PACERONE) 200 MG tablet Take 200 mg by mouth daily.    01/24/2022 at 1000   clopidogrel (PLAVIX) 75 MG tablet Take 75 mg by mouth daily.   01/24/2022 at  1000   donepezil (ARICEPT) 10 MG tablet Take 10 mg by mouth at bedtime.   01/24/2022 at 1000   ELIQUIS 5 MG TABS tablet Take 5 mg by mouth 2 (two) times daily.    01/24/2022 at 1000   glipiZIDE (GLUCOTROL XL) 2.5 MG 24 hr tablet Take 2.5 mg by mouth daily.   01/24/2022 at 1000   isosorbide mononitrate (IMDUR) 30 MG 24 hr tablet Take 30 mg by mouth daily.   01/24/2022 at 1000   levothyroxine (SYNTHROID) 88 MCG tablet Take 88 mcg by mouth daily.    01/24/2022 at 1000   lisinopril (ZESTRIL) 5 MG tablet Take 5 mg by mouth daily.   01/24/2022 at 1000   metoprolol succinate (TOPROL-XL) 100 MG 24 hr tablet TAKE ONE TABLET (100 MG) BY MOUTH EVERY DAY (TAKE  WITH OR IMMEDIATELY AFTER A MEAL) 90 tablet 1 01/24/2022 at 1000   montelukast (SINGULAIR) 10 MG tablet Take 1 tablet (10 mg total) by mouth at bedtime. 30 tablet 0 01/24/2022 at 1000   pravastatin (PRAVACHOL) 20 MG tablet Take 20 mg by mouth at bedtime.   01/24/2022 at 2000   torsemide (DEMADEX) 20 MG tablet Take 1 tablet (20 mg total) by mouth daily. 30 tablet 0 01/24/2022 at 1000   venlafaxine XR (EFFEXOR-XR) 75 MG 24 hr capsule Take 75 mg by mouth daily with breakfast.    01/24/2022 at 1000   cetirizine (ZYRTEC) 10 MG tablet Take 10 mg by mouth daily. (Patient not taking: Reported on 01/25/2022)   Not Taking   gabapentin (NEURONTIN) 100 MG capsule Take 100 mg by mouth 2 (two) times daily.   prn at prn    Social History   Socioeconomic History   Marital status: Widowed    Spouse name: Not on file   Number of children: 5   Years of education: Not on file   Highest education level: Not on file  Occupational History   Not on file  Tobacco Use   Smoking status: Never   Smokeless tobacco: Never  Vaping Use   Vaping Use: Never used  Substance and Sexual Activity   Alcohol use: Yes    Alcohol/week: 3.0 standard drinks    Types: 3 Shots of liquor per week    Comment: 3 x week    Drug use: Never   Sexual activity: Not Currently  Other Topics Concern   Not on file  Social History Narrative   Not on file   Social Determinants of Health   Financial Resource Strain: Not on file  Food Insecurity: Not on file  Transportation Needs: Not on file  Physical Activity: Not on file  Stress: Not on file  Social Connections: Not on file  Intimate Partner Violence: Not on file    Family History  Problem Relation Age of Onset   Hypertension Mother    Heart disease Father    CAD Father    Heart disease Sister    Diabetes Sister    Diabetes Paternal Uncle     Review of systems complete and found to be negative unless listed above   PHYSICAL EXAM General: Elderly appearing Caucasian female, well  nourished. Sitting upright in icu bed with son and daughter at bedside  HEENT:  Normocephalic and atraumatic. Neck:  No JVD.  Lungs: normal work of breathing on room air. Clear to auscultation bilaterally.  Heart: Tachy irregularly irregular rate and rhythm. Normal S1 and S2. No murmurs.  Radial & DP pulses 2+ bilaterally.  Abdomen: Soft, nontender but quite distended.  Msk: Normal strength and tone for age. Extremities: Warm and well perfused. Left upper arm with large ecchymosis. No clubbing, cyanosis.  No lower extremity edema.  Neuro: Alert and oriented X 3. Psych:  Answers questions appropriately.   Labs:   Lab Results  Component Value Date   WBC 17.3 (H) 01/27/2022   HGB 11.6 (L) 01/27/2022   HCT 35.1 (L) 01/27/2022   MCV 87.8 01/27/2022   PLT 228 01/27/2022    Recent Labs  Lab 01/25/22 0733 01/26/22 0800 01/27/22 0446  NA 138   < > 138  K 3.2*   < > 3.7  CL 98   < > 106  CO2 27   < > 25  BUN 21   < > 18  CREATININE 1.41*   < > 0.74  CALCIUM 8.9   < > 8.3*  PROT 7.8  --   --   BILITOT 0.9  --   --   ALKPHOS 82  --   --   ALT 20  --   --   AST 37  --   --   GLUCOSE 256*   < > 162*   < > = values in this interval not displayed.    Lab Results  Component Value Date   CKTOTAL 370 (H) 01/25/2022     Lab Results  Component Value Date   CHOL 195 01/11/2022   CHOL 166 05/10/2020   CHOL 103 06/17/2018   Lab Results  Component Value Date   HDL 45 01/11/2022   HDL 51 05/10/2020   HDL 37 (L) 06/17/2018   Lab Results  Component Value Date   LDLCALC 102 (H) 01/11/2022   LDLCALC 98 05/10/2020   LDLCALC 45 06/17/2018   Lab Results  Component Value Date   TRIG 281 (H) 01/11/2022   TRIG 84 05/10/2020   TRIG 107 06/17/2018   Lab Results  Component Value Date   CHOLHDL 4.3 01/11/2022   CHOLHDL 3.3 05/10/2020   CHOLHDL 2.8 06/17/2018   No results found for: LDLDIRECT    Radiology: DG Shoulder Right  Result Date: 01/26/2022 CLINICAL DATA:  Right shoulder  pain since fall two days ago. EXAM: RIGHT SHOULDER - 2+ VIEW COMPARISON:  Right shoulder x-rays from yesterday. FINDINGS: No acute fracture or dislocation. Unchanged mild acromioclavicular osteoarthritis. Osteopenia. Soft tissues are unremarkable. IMPRESSION: 1. No acute osseous abnormality. Electronically Signed   By: Titus Dubin M.D.   On: 01/26/2022 15:39   DG Shoulder Right  Result Date: 01/25/2022 CLINICAL DATA:  Fall with right shoulder pain. EXAM: RIGHT SHOULDER - 2+ VIEW COMPARISON:  None. FINDINGS: There is no evidence of fracture or dislocation. There is no evidence of arthropathy or other focal bone abnormality. Soft tissues are unremarkable. IMPRESSION: Negative. Electronically Signed   By: Misty Stanley M.D.   On: 01/25/2022 08:34   CT HEAD WO CONTRAST (5MM)  Result Date: 01/25/2022 CLINICAL DATA:  Trauma EXAM: CT HEAD WITHOUT CONTRAST TECHNIQUE: Contiguous axial images were obtained from the base of the skull through the vertex without intravenous contrast. RADIATION DOSE REDUCTION: This exam was performed according to the departmental dose-optimization program which includes automated exposure control, adjustment of the mA and/or kV according to patient size and/or use of iterative reconstruction technique. COMPARISON:  None. FINDINGS: Brain: Chronic white matter ischemic change. No evidence of acute infarction, hemorrhage, hydrocephalus, extra-axial collection or mass lesion/mass effect. Vascular: No hyperdense vessel or unexpected calcification. Skull: Normal.  Negative for fracture or focal lesion. Sinuses/Orbits: No acute finding. Other: None. IMPRESSION: No acute intracranial abnormality. Electronically Signed   By: Yetta Glassman M.D.   On: 01/25/2022 08:12   DG Chest Portable 1 View  Result Date: 01/25/2022 CLINICAL DATA:  SOB EXAM: PORTABLE CHEST 1 VIEW COMPARISON:  Earlier same day FINDINGS: Stable appearance of the cardiomediastinal silhouette. No pleural effusion. No  pneumothorax. Diffuse increase in prominent interstitial opacities. Osseous structures are unremarkable. IMPRESSION: Diffuse increase in interstitial opacities compared to radiograph earlier same day, most suggestive of pulmonary edema. No large pleural effusion. Electronically Signed   By: Albin Felling M.D.   On: 01/25/2022 13:04   DG Chest Port 1 View  Result Date: 01/25/2022 CLINICAL DATA:  Concern for sepsis EXAM: PORTABLE CHEST 1 VIEW COMPARISON:  Chest x-ray dated October 31, 2021 FINDINGS: Cardiac and mediastinal contours are within normal limits. Mild bilateral interstitial opacities. No focal consolidation. No large pleural effusion or pneumothorax. IMPRESSION: Mild bilateral interstitial opacities which are similar to prior and possibly due to pulmonary edema. No focal consolidation to suggest pneumonia. Electronically Signed   By: Yetta Glassman M.D.   On: 01/25/2022 08:56   DG Humerus Right  Result Date: 01/25/2022 CLINICAL DATA:  Fall.  Pain. EXAM: RIGHT HUMERUS - 2+ VIEW COMPARISON:  None. FINDINGS: There is no evidence of fracture or other focal bone lesions. Soft tissues are unremarkable. IMPRESSION: Negative. Electronically Signed   By: Misty Stanley M.D.   On: 01/25/2022 08:34    ECHO 10/31/21   1. Left ventricular ejection fraction, by estimation, is 45 to 50%. The  left ventricle has mildly decreased function. The left ventricle has no  regional wall motion abnormalities. Left ventricular diastolic parameters  were normal.   2. Right ventricular systolic function is normal. The right ventricular  size is normal.   3. The mitral valve is normal in structure. Mild mitral valve  regurgitation. No evidence of mitral stenosis.   4. The aortic valve is normal in structure. Aortic valve regurgitation is  not visualized. No aortic stenosis is present.   5. The inferior vena cava is normal in size with greater than 50%  respiratory variability, suggesting right atrial pressure of 3  mmHg.   TELEMETRY reviewed by me: AF vs sinus tach rate   EKG reviewed by me: Initial EKG at 0721 showed A. fib with a rate of 99, repeat EKG after receiving all the fluids showed A. fib with RVR at 145 at 1300.   ASSESSMENT AND PLAN:  The patient is a 32yoF with PMH significant for paroxysmal atrial fibrillation on amiodarone and Eliquis, CAD with history of NSTEMI 10/12/21 with LHC showing 99% proximal RCA stenosis and was treated medically, HFrEF (10/2021 LVEF 40-45 %), HTN, hyperlipidemia, type 2 diabetes who presented to [facility] ED the morning of 01/25/22 after a fall, injuring her right arm. She spent the night on the floor of her apartment unable to get up. She is admitted with sepsis from E coli bacteremia related to a UTI. Cardiology is consulted for assistance with her AF with RVR.   #paroxysmal AF with RVR  #sepsis 2/2 ecoli bacteremia #elevated troponin 2/2 demand ischemia The patient presented with a few day history of fatigue, weakness and passed out and fell last night. She remained on the floor overnight until she was able to call for help this morning. UA positive for leuks, nitrites, bacteria, lactate 3.1, WBC of 30 and she was giving significant volume of  2.5L fluids and more with empiric abx, resulting in flash pulmonary edema, respiratory distress requiring BIPAP and AF with RVR. S/p IV lasix 80 with good output.  Troponin 166-129 likely related to demand ischemia from AF and underlying infection. Pt has underlying CAD but continues to deny chest pain.  -Recommend treat underlying infection, judicious use of IVF -On RA - Continue Eliquis - DC amiodarone gtt this morning, start PO amio 400 BID for 3 days, then 200 mg daily.  - Resume metoprolol 12.5 mg BID (home dose XL 50 mg) - Transition from IV to PO lasix 40 mg today -home meds: amiodarone 200mg  daily, 50mg  metop succinate and eliquis 5 BID for stroke risk reduction -chadsvasc ~6   #CAD with Hx NSTEMI 09/2021, medically  managed #HTN #hyperlipidemia -defer heparin gtt -continue Eliquis 5 mg twice daily as above -continue plavix, pravastatin 20mg . LDL 102 and trigs 281. Consider escalation of statin therapy  -hold antihypertensives as remains BP soft - systolic 309M during interview. On lisinopril 5mg , imdur 30, and metop 50mg  at home.  -recommend continuing cardiac rehab at discharge.    Signed: Andrez Grime , MD 01/27/2022, 8:32 AM [facility] Card[facility]

## 2022-01-27 NOTE — Consult Note (Signed)
ORTHOPAEDIC CONSULTATION  REQUESTING PHYSICIAN: Wouk, Ailene Rud, MD  Chief Complaint:   Right shoulder pain  History of Present Illness: Alison Richardson is a 88 y.o. female with multiple medical problems including diabetes, congestive heart failure, atrial flutter, hypertension, hyperlipidemia, hypothyroidism, peripheral vascular disease, mild pulmonary hypertension, gastroesophageal reflux disease, sleep apnea, and early Alzheimer's who lives independently.  Apparently she became lightheaded and passed out in her bathroom on the day prior to admission, and apparently hurt her shoulder as she has complained of shoulder pain since the fall.  The patient denies any problems with her right shoulder, but does recall receiving a steroid injection into her left shoulder many years ago which she states helped to resolve her left shoulder symptoms.  The patient notes difficulty raising her arm even to shoulder level, but denies any numbness or paresthesias down her arm to her hand.  Past Medical History:  Diagnosis Date   Arrhythmia    Atrial flutter ([place]) 01/2018   new onset    Basal cell carcinoma of back    Basal cell carcinoma of lip    Cerebral aneurysm    followed by [facility]   CHF (congestive heart failure) (HCC)    DDD (degenerative disc disease), lumbar    superior plate depression, L3 08/18/2014   Diabetes mellitus type II, controlled ([place])    Diverticulosis    Dysrhythmia    Paroxysmal Supraventricular Tachycardia   Dysthymia    depression   GERD (gastroesophageal reflux disease)    History of meniscal tear    Hyperlipidemia    Hypertension    Hypothyroidism    Late onset Alzheimer's disease with behavioral disturbance ([place])    Mild pulmonary hypertension ([place])    Overactive bladder    Peripheral vascular disease ([place])    Seasonal allergic rhinitis    Sleep apnea    Past Surgical History:  Procedure Laterality  Date   APPENDECTOMY  1946   BASAL CELL CARCINOMA EXCISION     2006 and 2009 removed from back and lip   CARDIOVASCULAR STRESS TEST  2015   nuclear cardiac stress test negative for ischemia - Dr. Satira Sark   CATARACT EXTRACTION, BILATERAL  2006   COLONOSCOPY  2014   COLONOSCOPY N/A 08/19/2020   Procedure: COLONOSCOPY;  Surgeon: Lesly Rubenstein, MD;  Location: [facility] ENDOSCOPY;  Service: Endoscopy;  Laterality: N/A;   ECTOPIC PREGNANCY SURGERY  1957   ESOPHAGOGASTRODUODENOSCOPY N/A 08/19/2020   Procedure: ESOPHAGOGASTRODUODENOSCOPY (EGD);  Surgeon: Lesly Rubenstein, MD;  Location: [facility] ENDOSCOPY;  Service: Endoscopy;  Laterality: N/A;   INJECTION KNEE Left 05/2015   LEFT HEART CATH AND CORONARY ANGIOGRAPHY N/A 10/12/2021   Procedure: LEFT HEART CATH AND CORONARY ANGIOGRAPHY;  Surgeon: Corey Skains, MD;  Location: [place] [facility];  Service: Cardiovascular;  Laterality: N/A;   REPLACEMENT TOTAL KNEE Left 09/06/2016   Dr. Barnet Pall   TONSILLECTOMY AND ADENOIDECTOMY  1953   TOTAL ABDOMINAL HYSTERECTOMY  1986   [facility]   Social History   Socioeconomic History   Marital status: Widowed    Spouse name: Not on file   Number of children: 5   Years of education: Not on file   Highest education level: Not on file  Occupational History   Not on file  Tobacco Use   Smoking status: Never   Smokeless tobacco: Never  Vaping Use   Vaping Use: Never used  Substance and Sexual Activity   Alcohol use: Yes    Alcohol/week: 3.0 standard  drinks    Types: 3 Shots of liquor per week    Comment: 3 x week    Drug use: Never   Sexual activity: Not Currently  Other Topics Concern   Not on file  Social History Narrative   Not on file   Social Determinants of Health   Financial Resource Strain: Not on file  Food Insecurity: Not on file  Transportation Needs: Not on file  Physical Activity: Not on file  Stress: Not on file  Social Connections: Not on file   Family History  Problem  Relation Age of Onset   Hypertension Mother    Heart disease Father    CAD Father    Heart disease Sister    Diabetes Sister    Diabetes Paternal Uncle    Allergies  Allergen Reactions   Pantoprazole Nausea And Vomiting   Metformin And Related Diarrhea   Prior to Admission medications   Medication Sig Start Date End Date Taking? Authorizing Provider  amiodarone (PACERONE) 200 MG tablet Take 200 mg by mouth daily.  01/02/19  Yes [provider]  clopidogrel (PLAVIX) 75 MG tablet Take 75 mg by mouth daily.   Yes [provider]  donepezil (ARICEPT) 10 MG tablet Take 10 mg by mouth at bedtime.   Yes [provider]  ELIQUIS 5 MG TABS tablet Take 5 mg by mouth 2 (two) times daily.  05/06/20  Yes [provider]  glipiZIDE (GLUCOTROL XL) 2.5 MG 24 hr tablet Take 2.5 mg by mouth daily. 09/19/21  Yes [provider]  isosorbide mononitrate (IMDUR) 30 MG 24 hr tablet Take 30 mg by mouth daily.   Yes [provider]  levothyroxine (SYNTHROID) 88 MCG tablet Take 88 mcg by mouth daily.  09/21/19  Yes [provider]  lisinopril (ZESTRIL) 5 MG tablet Take 5 mg by mouth daily.   Yes [provider]  metoprolol succinate (TOPROL-XL) 100 MG 24 hr tablet TAKE ONE TABLET (100 MG) BY MOUTH EVERY DAY (TAKE WITH OR IMMEDIATELY AFTER A MEAL) 01/11/22  Yes Jon Billings, NP  montelukast (SINGULAIR) 10 MG tablet Take 1 tablet (10 mg total) by mouth at bedtime. 01/11/22  Yes Jon Billings, NP  pravastatin (PRAVACHOL) 20 MG tablet Take 20 mg by mouth at bedtime. 07/05/20  Yes [provider]  torsemide (DEMADEX) 20 MG tablet Take 1 tablet (20 mg total) by mouth daily. 11/03/21  Yes Richarda Osmond, MD  venlafaxine XR (EFFEXOR-XR) 75 MG 24 hr capsule Take 75 mg by mouth daily with breakfast.    Yes [provider]  cetirizine (ZYRTEC) 10 MG tablet Take 10 mg by mouth daily. Patient not taking: Reported on 01/25/2022     [provider]  gabapentin (NEURONTIN) 100 MG capsule Take 100 mg by mouth 2 (two) times daily. 05/06/20   [provider]   DG Shoulder Right  Result Date: 01/26/2022 CLINICAL DATA:  Right shoulder pain since fall two days ago. EXAM: RIGHT SHOULDER - 2+ VIEW COMPARISON:  Right shoulder x-rays from yesterday. FINDINGS: No acute fracture or dislocation. Unchanged mild acromioclavicular osteoarthritis. Osteopenia. Soft tissues are unremarkable. IMPRESSION: 1. No acute osseous abnormality. Electronically Signed   By: Titus Dubin M.D.   On: 01/26/2022 15:39    Positive ROS: All other systems have been reviewed and were otherwise negative with the exception of those mentioned in the HPI and as above.  Physical Exam: General:  Alert, no acute distress Psychiatric:  Patient is competent  for consent with normal mood and affect   Cardiovascular:  No pedal edema Respiratory:  No wheezing, non-labored breathing GI:  Abdomen is soft and non-tender Skin:  No lesions in the area of chief complaint Neurologic:  Sensation intact distally Lymphatic:  No axillary or cervical lymphadenopathy  Orthopedic Exam:  Orthopedic examination is limited to the right shoulder and upper extremity.  Skin inspection around the right shoulder is unremarkable.  No swelling, erythema, ecchymosis, abrasions, or other skin abnormalities are identified.  She has mild tenderness to palpation over the anterolateral aspect the shoulder, especially along the acromion.  Actively, she is able to forward flex to 70 degrees and abduct to 60 degrees with moderate pain at the extremes of these motions.  Passively, she can tolerate forward flexion to 130 degrees and abduction to 110 degrees.  At 90 degrees of abduction, she can tolerate external rotation to 80 degrees and internal rotation to 50 degrees.  Again, she has mild to moderate pain at the extremes of these motions.  She is able to generate 3+-4/5 strength with  gentle resisted internal and external rotation, as well as with gentle resisted abduction with her arm at her side.  She is neurovascularly intact to the right upper extremity and hand.  X-rays:  Recent AP, Y-scapular, and oblique views of the right shoulder are available for review and have been reviewed by myself.  These films demonstrate no evidence for fractures, lytic lesions, or significant degenerative changes.  The subacromial space appears to be well-maintained.  She exhibits a type I-II acromion.  Assessment: New onset right shoulder pain status post fall, cannot rule out rotator cuff tear.  Plan: The treatment options have been discussed with the patient.  Given the patient's age and recent onset of symptoms, I feel it is reasonable to try to manage her symptoms conservatively.  Therefore, the patient is offered and accepts a steroid injection into the right subacromial space.  After obtaining verbal consent, this injection is performed sterilely using 1 cc of Kenalog-40 (40 mg) and 5 cc of 0.5% Sensorcaine.  The patient tolerated the procedure well.  The patient may be mobilized with physical therapy as her shoulder symptoms and her medical condition permit.  If her symptoms do not improve, the patient would like to proceed with an MRI scan to assess her rotator cuff.  Thank you for asking me to participate in the care of this most pleasant woman.  I will be happy to follow her with you.   Pascal Lux, MD  Beeper #:  [phone]  01/27/2022 4:51 PM

## 2022-01-27 NOTE — Evaluation (Signed)
Occupational Therapy Evaluation Patient Details Name: Alison Richardson MRN: [id number] DOB: [birth year] Today's Date: 01/27/2022   History of Present Illness Pt is an 88 y/o F admitted on 01/25/22 after presenting with c/o increased urinary frequency/urgency x 1 week. 1 episode of N&V & fall to the floor, injuring RUE & shoulder, unable to get up so spent the night on the floor. Pt was tx for presumed sepsis & developed acute SOB & RVR & was initially placed on bipap. CXR shows increase in interstitial opacities compared with cxr on presentation, suggestive of pulmonary edema. RUE shoulder/arm x-ray negative for fx. PMH: a-fib, DM, HTN, hypothyroid, PAD, combined systolic/diastolic CHF, CAD, NSTEMI   Clinical Impression   Pt seen for OT evaluation this date in setting of acute hospitalization d/t fall and possible sepsis. She reports being INDEP at baseline, living in [place] [facility] at [place]. She presents this date with R UE pain-shoulder-that has been persisting since her fall. In addition, she reports feeling better, but still reports increased weakness versus her baseline. She currently requires: SETUP for seated UB ADLs, MOD A For seated LB ADLs, MIN A for bed mobility and MIN A for ADL transfers with HHA. She is transferred to chair with all needs met and in reach. Tolerates session well. Her BP is stable in sitting and standing. HR in bed 102 bpm, standing 125 bpm, and seated in chair end of session 110bpm. RN notified of session contents. Will continue to follow. Recommend STR f/u OT services.      Recommendations for follow up therapy are one component of a multi-disciplinary discharge planning process, led by the attending physician.  Recommendations may be updated based on patient status, additional functional criteria and insurance authorization.   Follow Up Recommendations  Skilled nursing-short term rehab (<3 hours/day)    Assistance Recommended at Discharge Set up  Supervision/Assistance  Patient can return home with the following A little help with walking and/or transfers;A little help with bathing/dressing/bathroom;Assistance with cooking/housework;Help with stairs or ramp for entrance;Assist for transportation    Functional Status Assessment  Patient has had a recent decline in their functional status and demonstrates the ability to make significant improvements in function in a reasonable and predictable amount of time.  Equipment Recommendations  BSC/3in1    Recommendations for Other Services       Precautions / Restrictions Precautions Precautions: Fall Precaution Comments: limited R shoulder movement 2/2 weakness/pain Restrictions Weight Bearing Restrictions: No      Mobility Bed Mobility Overal bed mobility: Needs Assistance Bed Mobility: Supine to Sit     Supine to sit: Min assist, HOB elevated     General bed mobility comments: increased time, slightly trunk assist    Transfers Overall transfer level: Needs assistance Equipment used: 1 person hand held assist Transfers: Sit to/from Stand Sit to Stand: Min assist           General transfer comment: somewhat unsteady intially, improves with a few seconds. BP WFL both sitting and standing. No c/o dizziness.      Balance Overall balance assessment: Needs assistance Sitting-balance support: Feet supported, No upper extremity supported Sitting balance-Leahy Scale: Good Sitting balance - Comments: G supv     Standing balance-Leahy Scale: Poor Standing balance comment: MIN A for static standing, declines use of RW, but would definitely benefit from b/l support  ADL either performed or assessed with clinical judgement   ADL Overall ADL's : Needs assistance/impaired                                       General ADL Comments: SETUP for seated UB ADLs, MOD A For seated LB ADLs, MIN A for bed mobility and MIN A for  ADL transfers with HHA.     Vision Patient Visual Report: No change from baseline       Perception     Praxis      Pertinent Vitals/Pain Pain Assessment Pain Assessment: Faces Faces Pain Scale: Hurts little more Pain Location: R shoulder when attempting to flex Pain Descriptors / Indicators: Discomfort Pain Intervention(s): Limited activity within patient's tolerance, Monitored during session     Hand Dominance Right   Extremity/Trunk Assessment Upper Extremity Assessment Upper Extremity Assessment: RUE deficits/detail;LUE deficits/detail RUE Deficits / Details: unable to flex shld, all other ROM WFL. Grip MMT grossly 4-/5 LUE Deficits / Details: ROM WFL, Grip MMT grossly 4/5       Cervical / Trunk Assessment Cervical / Trunk Assessment: Normal   Communication Communication Communication: HOH   Cognition Arousal/Alertness: Awake/alert Behavior During Therapy: WFL for tasks assessed/performed Overall Cognitive Status: Within Functional Limits for tasks assessed                                 General Comments: Pleasant lady     General Comments  HR 102 at rest, 125 with standing, 110 once seated in chair, RN aware.    Exercises Other Exercises Other Exercises: OT ed re: role, recs   Shoulder Instructions      Home Living Family/patient expects to be discharged to:: Skilled nursing facility Living Arrangements: Alone                               Additional Comments: [place] apartment on the 5th floor with elevator access      Prior Functioning/Environment Prior Level of Function : Independent/Modified Independent             Mobility Comments: mod I with QC but began using rollator 2-3 days prior to admission 2/2 feeling "unsteady" ADLs Comments: independent prior        OT Problem List: Decreased strength;Decreased range of motion;Decreased activity tolerance;Impaired balance (sitting and/or  standing);Decreased knowledge of use of DME or AE;Cardiopulmonary status limiting activity;Impaired UE functional use      OT Treatment/Interventions: Self-care/ADL training;Therapeutic exercise;DME and/or AE instruction;Therapeutic activities;Patient/family education    OT Goals(Current goals can be found in the care plan section) Acute Rehab OT Goals Patient Stated Goal: to get stronger OT Goal Formulation: With patient Time For Goal Achievement: 02/10/22 Potential to Achieve Goals: Good  OT Frequency: Min 2X/week    Co-evaluation              AM-PAC OT "6 Clicks" Daily Activity     Outcome Measure Help from another person eating meals?: None Help from another person taking care of personal grooming?: A Little Help from another person toileting, which includes using toliet, bedpan, or urinal?: A Little Help from another person bathing (including washing, rinsing, drying)?: A Lot Help from another person to put on and taking off regular upper body clothing?:  A Little Help from another person to put on and taking off regular lower body clothing?: A Lot 6 Click Score: 17   End of Session Equipment Utilized During Treatment: Gait belt Nurse Communication: Mobility status;Other (comment) (HR, BP)  Activity Tolerance: Patient tolerated treatment well Patient left: in chair;with call bell/phone within reach  OT Visit Diagnosis: Unsteadiness on feet (R26.81);Muscle weakness (generalized) (M62.81)                Time: 6148-3073 OT Time Calculation (min): 32 min Charges:  OT General Charges $OT Visit: 1 Visit OT Evaluation $OT Eval Moderate Complexity: 1 Mod OT Treatments $Self Care/Home Management : 8-22 mins $Therapeutic Activity: 8-22 mins  Gerrianne Scale, MS, OTR/L ascom [phone] 01/27/22, 3:48 PM

## 2022-01-28 DIAGNOSIS — W19XXXA Unspecified fall, initial encounter: Secondary | ICD-10-CM | POA: Diagnosis not present

## 2022-01-28 DIAGNOSIS — R112 Nausea with vomiting, unspecified: Secondary | ICD-10-CM | POA: Diagnosis not present

## 2022-01-28 LAB — MAGNESIUM: Magnesium: 2.2 mg/dL (ref 1.7–2.4)

## 2022-01-28 LAB — BASIC METABOLIC PANEL
Anion gap: 8 (ref 5–15)
BUN: 15 mg/dL (ref 8–23)
CO2: 24 mmol/L (ref 22–32)
Calcium: 8.7 mg/dL — ABNORMAL LOW (ref 8.9–10.3)
Chloride: 106 mmol/L (ref 98–111)
Creatinine, Ser: 0.83 mg/dL (ref 0.44–1.00)
GFR, Estimated: >60 mL/min (ref >60)
Glucose, Bld: 243 mg/dL — ABNORMAL HIGH (ref 70–99)
Potassium: 4.2 mmol/L (ref 3.5–5.1)
Sodium: 138 mmol/L (ref 135–145)

## 2022-01-28 LAB — GLUCOSE, CAPILLARY
Glucose-Capillary: 193 mg/dL — ABNORMAL HIGH (ref 70–99)
Glucose-Capillary: 207 mg/dL — ABNORMAL HIGH (ref 70–99)
Glucose-Capillary: 193 mg/dL — ABNORMAL HIGH (ref 70–99)
Glucose-Capillary: 176 mg/dL — ABNORMAL HIGH (ref 70–99)

## 2022-01-28 MED ORDER — METOPROLOL TARTRATE 25 MG PO TABS
37.5000 mg | ORAL_TABLET | Freq: Two times a day (BID) | ORAL | Status: DC
  Administered 2022-01-28 (×2): 37.5 mg via ORAL
  Filled 2022-01-28 (×2): qty 2

## 2022-01-28 MED ORDER — INSULIN GLARGINE-YFGN 100 UNIT/ML ~~LOC~~ SOLN
5.0000 [IU] | Freq: Every day | SUBCUTANEOUS | Status: DC
  Administered 2022-01-28 – 2022-01-29 (×2): 5 [IU] via SUBCUTANEOUS
  Filled 2022-01-28 (×2): qty 0.05

## 2022-01-28 MED ORDER — ACETAMINOPHEN 500 MG PO TABS
1000.0000 mg | ORAL_TABLET | Freq: Four times a day (QID) | ORAL | Status: DC | PRN
  Administered 2022-01-28 – 2022-01-30 (×3): 1000 mg via ORAL
  Filled 2022-01-28 (×4): qty 2

## 2022-01-28 NOTE — Progress Notes (Signed)
[place] NOTE       Patient ID: Alison Richardson MRN: [id number] DOB/AGE: [id number] 86 y.o.  Admit date: 01/25/2022 Referring Physician Dr. Si Raider  Primary Physician Dr. Emily Filbert Primary Cardiologist Dr. Corky Sox  Reason for Consultation AF RVR  HPI: The patient is a 88yoF with PMH significant for paroxysmal atrial fibrillation on amiodarone and Eliquis, CAD with history of NSTEMI 09/2021 with LHC showing 99% proximal RCA stenosis and was treated medically, HFrEF (LVEF 40-45 by LV gram), HTN, hyperlipidemia, type 2 diabetes who presented to [facility] ED the morning of 01/25/22 after a fall, injuring her right arm. She spent the night on the floor of her apartment unable to get up. She is admitted with sepsis from a UTI with E coli bacteremia. Cardiology is consulted for assistance with her AF with RVR.   Interval history: - Feeling better today.  - No acute events overnight.  - Denies shortness of breath or chest pain.   Review of systems complete and found to be negative unless listed above     Past Medical History:  Diagnosis Date   Arrhythmia    Atrial flutter ([place]) 01/2018   new onset    Basal cell carcinoma of back    Basal cell carcinoma of lip    Cerebral aneurysm    followed by [facility]   CHF (congestive heart failure) (HCC)    DDD (degenerative disc disease), lumbar    superior plate depression, L3 08/18/2014   Diabetes mellitus type II, controlled ([place])    Diverticulosis    Dysrhythmia    Paroxysmal Supraventricular Tachycardia   Dysthymia    depression   GERD (gastroesophageal reflux disease)    History of meniscal tear    Hyperlipidemia    Hypertension    Hypothyroidism    Late onset Alzheimer's disease with behavioral disturbance ([place])    Mild pulmonary hypertension ([place])    Overactive bladder    Peripheral vascular disease ([place])    Seasonal allergic rhinitis    Sleep apnea     Past Surgical History:  Procedure Laterality Date    APPENDECTOMY  1946   BASAL CELL CARCINOMA EXCISION     2006 and 2009 removed from back and lip   CARDIOVASCULAR STRESS TEST  2015   nuclear cardiac stress test negative for ischemia - Dr. Satira Sark   CATARACT EXTRACTION, BILATERAL  2006   COLONOSCOPY  2014   COLONOSCOPY N/A 08/19/2020   Procedure: COLONOSCOPY;  Surgeon: Lesly Rubenstein, MD;  Location: [facility] ENDOSCOPY;  Service: Endoscopy;  Laterality: N/A;   ECTOPIC PREGNANCY SURGERY  1957   ESOPHAGOGASTRODUODENOSCOPY N/A 08/19/2020   Procedure: ESOPHAGOGASTRODUODENOSCOPY (EGD);  Surgeon: Lesly Rubenstein, MD;  Location: [facility] ENDOSCOPY;  Service: Endoscopy;  Laterality: N/A;   INJECTION KNEE Left 05/2015   LEFT HEART CATH AND CORONARY ANGIOGRAPHY N/A 10/12/2021   Procedure: LEFT HEART CATH AND CORONARY ANGIOGRAPHY;  Surgeon: Corey Skains, MD;  Location: [place] CV LAB;  Service: Cardiovascular;  Laterality: N/A;   REPLACEMENT TOTAL KNEE Left 09/06/2016   Dr. Barnet Pall   TONSILLECTOMY AND ADENOIDECTOMY  1953   TOTAL ABDOMINAL HYSTERECTOMY  1986   DU B    Medications Prior to Admission  Medication Sig Dispense Refill Last Dose   amiodarone (PACERONE) 200 MG tablet Take 200 mg by mouth daily.    01/24/2022 at 1000   clopidogrel (PLAVIX) 75 MG tablet Take 75 mg by mouth daily.   01/24/2022 at 1000   donepezil (ARICEPT)  10 MG tablet Take 10 mg by mouth at bedtime.   01/24/2022 at 1000   ELIQUIS 5 MG TABS tablet Take 5 mg by mouth 2 (two) times daily.    01/24/2022 at 1000   glipiZIDE (GLUCOTROL XL) 2.5 MG 24 hr tablet Take 2.5 mg by mouth daily.   01/24/2022 at 1000   isosorbide mononitrate (IMDUR) 30 MG 24 hr tablet Take 30 mg by mouth daily.   01/24/2022 at 1000   levothyroxine (SYNTHROID) 88 MCG tablet Take 88 mcg by mouth daily.    01/24/2022 at 1000   lisinopril (ZESTRIL) 5 MG tablet Take 5 mg by mouth daily.   01/24/2022 at 1000   metoprolol succinate (TOPROL-XL) 100 MG 24 hr tablet TAKE ONE TABLET (100 MG) BY MOUTH EVERY DAY (TAKE WITH OR  IMMEDIATELY AFTER A MEAL) 90 tablet 1 01/24/2022 at 1000   montelukast (SINGULAIR) 10 MG tablet Take 1 tablet (10 mg total) by mouth at bedtime. 30 tablet 0 01/24/2022 at 1000   pravastatin (PRAVACHOL) 20 MG tablet Take 20 mg by mouth at bedtime.   01/24/2022 at 2000   torsemide (DEMADEX) 20 MG tablet Take 1 tablet (20 mg total) by mouth daily. 30 tablet 0 01/24/2022 at 1000   venlafaxine XR (EFFEXOR-XR) 75 MG 24 hr capsule Take 75 mg by mouth daily with breakfast.    01/24/2022 at 1000   cetirizine (ZYRTEC) 10 MG tablet Take 10 mg by mouth daily. (Patient not taking: Reported on 01/25/2022)   Not Taking   gabapentin (NEURONTIN) 100 MG capsule Take 100 mg by mouth 2 (two) times daily.   prn at prn    Social History   Socioeconomic History   Marital status: Widowed    Spouse name: Not on file   Number of children: 5   Years of education: Not on file   Highest education level: Not on file  Occupational History   Not on file  Tobacco Use   Smoking status: Never   Smokeless tobacco: Never  Vaping Use   Vaping Use: Never used  Substance and Sexual Activity   Alcohol use: Yes    Alcohol/week: 3.0 standard drinks    Types: 3 Shots of liquor per week    Comment: 3 x week    Drug use: Never   Sexual activity: Not Currently  Other Topics Concern   Not on file  Social History Narrative   Not on file   Social Determinants of Health   Financial Resource Strain: Not on file  Food Insecurity: Not on file  Transportation Needs: Not on file  Physical Activity: Not on file  Stress: Not on file  Social Connections: Not on file  Intimate Partner Violence: Not on file    Family History  Problem Relation Age of Onset   Hypertension Mother    Heart disease Father    CAD Father    Heart disease Sister    Diabetes Sister    Diabetes Paternal Uncle     Review of systems complete and found to be negative unless listed above   PHYSICAL EXAM General: Elderly appearing Caucasian female, well  nourished. Sitting upright in icu bed with son and daughter at bedside  HEENT:  Normocephalic and atraumatic. Neck:  No JVD.  Lungs: normal work of breathing on room air. Clear to auscultation bilaterally.  Heart: Tachy irregularly irregular rate and rhythm. Normal S1 and S2. No murmurs.  Radial & DP pulses 2+ bilaterally. Abdomen: Soft, nontender but quite  distended.  Msk: Normal strength and tone for age. Extremities: Warm and well perfused. Left upper arm with large ecchymosis. No clubbing, cyanosis.  No lower extremity edema.  Neuro: Alert and oriented X 3. Psych:  Answers questions appropriately.   Labs:   Lab Results  Component Value Date   WBC 17.3 (H) 01/27/2022   HGB 11.6 (L) 01/27/2022   HCT 35.1 (L) 01/27/2022   MCV 87.8 01/27/2022   PLT 228 01/27/2022    Recent Labs  Lab 01/25/22 0733 01/26/22 0800 01/28/22 0506  NA 138   < > 138  K 3.2*   < > 4.2  CL 98   < > 106  CO2 27   < > 24  BUN 21   < > 15  CREATININE 1.41*   < > 0.83  CALCIUM 8.9   < > 8.7*  PROT 7.8  --   --   BILITOT 0.9  --   --   ALKPHOS 82  --   --   ALT 20  --   --   AST 37  --   --   GLUCOSE 256*   < > 243*   < > = values in this interval not displayed.    Lab Results  Component Value Date   CKTOTAL 370 (H) 01/25/2022     Lab Results  Component Value Date   CHOL 195 01/11/2022   CHOL 166 05/10/2020   CHOL 103 06/17/2018   Lab Results  Component Value Date   HDL 45 01/11/2022   HDL 51 05/10/2020   HDL 37 (L) 06/17/2018   Lab Results  Component Value Date   LDLCALC 102 (H) 01/11/2022   LDLCALC 98 05/10/2020   LDLCALC 45 06/17/2018   Lab Results  Component Value Date   TRIG 281 (H) 01/11/2022   TRIG 84 05/10/2020   TRIG 107 06/17/2018   Lab Results  Component Value Date   CHOLHDL 4.3 01/11/2022   CHOLHDL 3.3 05/10/2020   CHOLHDL 2.8 06/17/2018   No results found for: LDLDIRECT    Radiology: DG Shoulder Right  Result Date: 01/26/2022 CLINICAL DATA:  Right shoulder  pain since fall two days ago. EXAM: RIGHT SHOULDER - 2+ VIEW COMPARISON:  Right shoulder x-rays from yesterday. FINDINGS: No acute fracture or dislocation. Unchanged mild acromioclavicular osteoarthritis. Osteopenia. Soft tissues are unremarkable. IMPRESSION: 1. No acute osseous abnormality. Electronically Signed   By: Titus Dubin M.D.   On: 01/26/2022 15:39   DG Shoulder Right  Result Date: 01/25/2022 CLINICAL DATA:  Fall with right shoulder pain. EXAM: RIGHT SHOULDER - 2+ VIEW COMPARISON:  None. FINDINGS: There is no evidence of fracture or dislocation. There is no evidence of arthropathy or other focal bone abnormality. Soft tissues are unremarkable. IMPRESSION: Negative. Electronically Signed   By: Misty Stanley M.D.   On: 01/25/2022 08:34   CT HEAD WO CONTRAST (5MM)  Result Date: 01/25/2022 CLINICAL DATA:  Trauma EXAM: CT HEAD WITHOUT CONTRAST TECHNIQUE: Contiguous axial images were obtained from the base of the skull through the vertex without intravenous contrast. RADIATION DOSE REDUCTION: This exam was performed according to the departmental dose-optimization program which includes automated exposure control, adjustment of the mA and/or kV according to patient size and/or use of iterative reconstruction technique. COMPARISON:  None. FINDINGS: Brain: Chronic white matter ischemic change. No evidence of acute infarction, hemorrhage, hydrocephalus, extra-axial collection or mass lesion/mass effect. Vascular: No hyperdense vessel or unexpected calcification. Skull: Normal. Negative for fracture or focal  lesion. Sinuses/Orbits: No acute finding. Other: None. IMPRESSION: No acute intracranial abnormality. Electronically Signed   By: Yetta Glassman M.D.   On: 01/25/2022 08:12   DG Chest Portable 1 View  Result Date: 01/25/2022 CLINICAL DATA:  SOB EXAM: PORTABLE CHEST 1 VIEW COMPARISON:  Earlier same day FINDINGS: Stable appearance of the cardiomediastinal silhouette. No pleural effusion. No  pneumothorax. Diffuse increase in prominent interstitial opacities. Osseous structures are unremarkable. IMPRESSION: Diffuse increase in interstitial opacities compared to radiograph earlier same day, most suggestive of pulmonary edema. No large pleural effusion. Electronically Signed   By: Albin Felling M.D.   On: 01/25/2022 13:04   DG Chest Port 1 View  Result Date: 01/25/2022 CLINICAL DATA:  Concern for sepsis EXAM: PORTABLE CHEST 1 VIEW COMPARISON:  Chest x-ray dated October 31, 2021 FINDINGS: Cardiac and mediastinal contours are within normal limits. Mild bilateral interstitial opacities. No focal consolidation. No large pleural effusion or pneumothorax. IMPRESSION: Mild bilateral interstitial opacities which are similar to prior and possibly due to pulmonary edema. No focal consolidation to suggest pneumonia. Electronically Signed   By: Yetta Glassman M.D.   On: 01/25/2022 08:56   DG Humerus Right  Result Date: 01/25/2022 CLINICAL DATA:  Fall.  Pain. EXAM: RIGHT HUMERUS - 2+ VIEW COMPARISON:  None. FINDINGS: There is no evidence of fracture or other focal bone lesions. Soft tissues are unremarkable. IMPRESSION: Negative. Electronically Signed   By: Misty Stanley M.D.   On: 01/25/2022 08:34    ECHO 10/31/21   1. Left ventricular ejection fraction, by estimation, is 45 to 50%. The  left ventricle has mildly decreased function. The left ventricle has no  regional wall motion abnormalities. Left ventricular diastolic parameters  were normal.   2. Right ventricular systolic function is normal. The right ventricular  size is normal.   3. The mitral valve is normal in structure. Mild mitral valve  regurgitation. No evidence of mitral stenosis.   4. The aortic valve is normal in structure. Aortic valve regurgitation is  not visualized. No aortic stenosis is present.   5. The inferior vena cava is normal in size with greater than 50%  respiratory variability, suggesting right atrial pressure of 3  mmHg.   TELEMETRY reviewed by me: AF vs sinus tach rate   EKG reviewed by me: Initial EKG at 0721 showed A. fib with a rate of 99, repeat EKG after receiving all the fluids showed A. fib with RVR at 145 at 1300.   ASSESSMENT AND PLAN:  The patient is a 87yoF with PMH significant for paroxysmal atrial fibrillation on amiodarone and Eliquis, CAD with history of NSTEMI 10/12/21 with LHC showing 99% proximal RCA stenosis and was treated medically, HFrEF (10/2021 LVEF 40-45 %), HTN, hyperlipidemia, type 2 diabetes who presented [facility] ED the morning of 01/25/22 after a fall, injuring her right arm. She spent the night on the floor of her apartment unable to get up. She is admitted with sepsis from E coli bacteremia related to a UTI. Cardiology is consulted for assistance with her AF with RVR.   #paroxysmal AF with RVR  #sepsis 2/2 ecoli bacteremia #elevated troponin 2/2 demand ischemia The patient presented with a few day history of fatigue, weakness and passed out and fell last night. She remained on the floor overnight until she was able to call for help this morning. UA positive for leuks, nitrites, bacteria, lactate 3.1, WBC of 30 and she was giving significant volume of 2.5L fluids and more with  empiric abx, resulting in flash pulmonary edema, respiratory distress requiring BIPAP and AF with RVR. S/p IV lasix 80 with good output.  Troponin 166-129 likely related to demand ischemia from AF and underlying infection. Pt has underlying CAD but continues to deny chest pain.  -Recommend treat underlying infection, judicious use of IVF -On RA - Continue Eliquis - DC amiodarone gtt this morning, start PO amio 400 BID for 2 days, then 200 mg daily.  - Increase Metoprolol 37.5 mg BID -  PO lasix 40 mg today -home meds: amiodarone 200mg  daily, 50mg  metop succinate and eliquis 5 BID for stroke risk reduction -chadsvasc ~6   #CAD with Hx NSTEMI 09/2021, medically managed #HTN #hyperlipidemia -defer  heparin gtt -continue Eliquis 5 mg twice daily as above -continue plavix, pravastatin 20mg . LDL 102 and trigs 281. Consider escalation of statin therapy  -hold antihypertensives as remains BP soft - systolic 712R during interview. On lisinopril 5mg , imdur 30, and metop 50mg  at home.  -recommend continuing cardiac rehab at discharge.    Signed: Andrez Grime , MD 01/28/2022, 9:10 AM [facility]

## 2022-01-28 NOTE — TOC Progression Note (Addendum)
Transition of Care [facility]) - Progression Note    Patient Details  Name: Alison Richardson MRN: [id number] Date of Birth: 04/06/1933  Transition of Care [facility]) CM/SW [place], [place] Phone Number: 01/28/2022, 3:04 PM  Clinical Narrative:      Insurance auth for [facility] SNF pending at this time.  Insurance requested updated PT LOF  notes after 2/3 if avail as they report PT on 2/3 states unable to do standing or gait due to members Heart rate elevating to 150 during sitting.   CSW has messaged PT requesting they see patient again.   Expected Discharge Plan: [place] Barriers to Discharge: Continued Medical Work up  Expected Discharge Plan and Services Expected Discharge Plan: [place]   Discharge Planning Services: CM Consult Post Acute Care Choice: [place] Living arrangements for the past 2 months: [place]                 DME Arranged: N/A DME Agency: NA                   Social Determinants of Health (SDOH) Interventions    Readmission Risk Interventions Readmission Risk Prevention Plan 01/26/2022 11/02/2021  Transportation Screening Complete Complete  PCP or Specialist Appt within 3-5 Days Complete Complete  HRI or Home Care Consult Complete Complete  Social Work Consult for [place] Planning/Counseling Complete Complete  Palliative Care Screening Not Applicable Not Applicable  Medication Review [employer] photographer) Complete Complete  Some recent data might be hidden

## 2022-01-28 NOTE — Progress Notes (Signed)
PROGRESS NOTE    Alison Richardson  TIW:[id number] DOB: August 09, 1933 DOA: 01/25/2022 PCP: Jon Billings, NP  Outpatient Specialists: cardiology    Brief Narrative:   From admission h and p Alison Richardson is a 88 y.o. female with medical history significant for a fib, dm, htn, hypothyroid, PAD, combined systolic/diastolic chf, cad/nstemi, who presents with the above.   Reports about a week of increased urinary frequency/urgency. No dysuria. No fevers. Yesterday felt nauseaus, went to bathroom and vomited. Fell to floor, injuring right arm and shoulder. Was unable to get up and call for help and so spent the night on the floor. No chest pain or dyspnea. No fevers. No diarrhea. No other vomiting.    In the ED was found to be leukycotitic with aki of 1.4 and elevated lactate. Was treated for presumed sepsis with antibiotics and IV fluids.   Shortly prior to my evaluation the patient developed acute shortness of breath and RVR. She was placed on bipap and given 80 IV lasix. CXR shows increase in interstitial opacities compared with cxr on presentation, suggestive of pulmonary edema.    Assessment & Plan:   Principal Problem:   Fall Active Problems:   Paroxysmal atrial fibrillation with rapid ventricular response (HCC)   Vitamin B12 deficiency   Type 2 diabetes mellitus with diabetic neuropathy (HCC)   Benign essential hypertension   Obstructive sleep apnea   Acute cystitis   # Acute cystitis # Sepsis # E. Coli bacteremia One week urinary frequency, one episode vomiting at home, urinalysis suggestive of infection. The likely inciting event for her other hospital problems. Sepsis by leukocytosis, tachycardia, lactic acidosis. S/p fluid resuscitation. GNRs growing in 4/4. Abx started after UA was obtained so though urine culture is neg suspect that to be the source. Symptomatically improved. Cultures pan-sensitive - stopped ceftriaxone (2/2-2/4), discussed w/ pharmacy, started keflex 500  q6 on 2/4, plan for 7 day course   # A-fib with rvr # Flash pulmonary edema # Acute hypoxic respiratory failure Treated for sepsis with liberal fluids in the ED. Developed flash pulmonary edema and a fib with rvr. Treated with bipap and lasix. Cardiology now following. Weaned off bipap and now off o2. Amio gtt discontinued today - plan for amio is 400 bid for 3 days, then 200 qd - po lasix 40 - metoprolol added back  - con eliquis   # Demand ischemia Trop 250>539, no chest pain, likely demand from above processes - monitor   # Hypokalemia Resolved w/ supplementation - monitor   # T2DM Here hyperglycemic - SSI - add semglee 5   # Fall # Shoulder injury With right shoulder/arm pain. X-ray neg for fracture. Normal rom hip without pain. Unable to abduct arm, in pain, family very concerned. Ortho consulted 2/4, thinks possible rotator cuff injury, CSI provided. Pain somewhat improved today - cont PT, advising SNF, insurance auth pending - ortho f/u prn   # Acute kidney injury 2/2 uti, immobility. Resolved w/ fluids - monitor   # Neuropathy - home effexor, gabapentin   # Hypothyroid - home levothyroxine   # CAD - home eliquis, plavix, statin     DVT prophylaxis: home eliquis Code Status: dnr Family Communication:  son updated @ bedside 2/5  Level of care: Progressive Status is: Inpatient Remains inpatient appropriate because: unsafe d/c plan            Consultants:  cardiology  Procedures: none  Antimicrobials:  Cefepime>ceftriaxone>keflex   Subjective: Ongoing right shoulder pain, no  other pain, no sob or cough  Objective: Vitals:   01/27/22 2358 01/28/22 0333 01/28/22 0643 01/28/22 0809  BP: 127/81 121/89  (!) 131/93  Pulse: 100 (!) 116 (!) 102 97  Resp: 19 20 20 16   Temp: 98.3 F (36.8 C)   97.7 F (36.5 C)  TempSrc:    Oral  SpO2: 94% 95% 97% 98%  Weight:      Height:        Intake/Output Summary (Last 24 hours) at 01/28/2022  1119 Last data filed at 01/28/2022 1004 Gross per 24 hour  Intake 660 ml  Output 700 ml  Net -40 ml   Filed Weights   01/25/22 0722  Weight: 80.7 kg    Examination:  General exam: Appears calm and comfortable  Respiratory system: Clear to auscultation save for rales at bases Cardiovascular system: S1 & S2 heard, RRR. No JVD, murmurs, rubs, gallops or clicks. No pedal edema. Gastrointestinal system: Abdomen is nondistended, soft and nontender. No organomegaly or masses felt. Normal bowel sounds heard. Central nervous system: Alert. No focal neurological deficits. MSK: unable to abduct right shoulder above 90 Skin: No rashes, lesions or ulcers Psychiatry: mildly confused, calm    Data Reviewed: I have personally reviewed following labs and imaging studies  CBC: Recent Labs  Lab 01/25/22 0733 01/26/22 0800 01/27/22 0446  WBC 27.7* 23.5* 17.3*  HGB 13.7 10.8* 11.6*  HCT 41.7 33.1* 35.1*  MCV 88.3 90.7 87.8  PLT 277 206 240   Basic Metabolic Panel: Recent Labs  Lab 01/25/22 0733 01/25/22 0954 01/26/22 0800 01/26/22 0928 01/27/22 0446 01/28/22 0506  NA 138  --  139  --  138 138  K 3.2*  --  3.4*  --  3.7 4.2  CL 98  --  108  --  106 106  CO2 27  --  22  --  25 24  GLUCOSE 256*  --  127*  --  162* 243*  BUN 21  --  23  --  18 15  CREATININE 1.41*  --  1.10*  --  0.74 0.83  CALCIUM 8.9  --  7.6*  --  8.3* 8.7*  MG  --  1.7  --  1.6* 2.1 2.2   GFR: Estimated Creatinine Clearance: 52.2 mL/min (by C-G formula based on SCr of 0.83 mg/dL). Liver Function Tests: Recent Labs  Lab 01/25/22 0733  AST 37  ALT 20  ALKPHOS 82  BILITOT 0.9  PROT 7.8  ALBUMIN 3.9   No results for input(s): LIPASE, AMYLASE in the last 168 hours. No results for input(s): AMMONIA in the last 168 hours. Coagulation Profile: Recent Labs  Lab 01/25/22 0848  INR 1.5*   Cardiac Enzymes: Recent Labs  Lab 01/25/22 0742  CKTOTAL 370*   BNP (last 3 results) No results for input(s):  PROBNP in the last 8760 hours. HbA1C: No results for input(s): HGBA1C in the last 72 hours. CBG: Recent Labs  Lab 01/27/22 0745 01/27/22 1135 01/27/22 1623 01/27/22 2015 01/28/22 0812  GLUCAP 170* 204* 174* 265* 193*   Lipid Profile: No results for input(s): CHOL, HDL, LDLCALC, TRIG, CHOLHDL, LDLDIRECT in the last 72 hours. Thyroid Function Tests: No results for input(s): TSH, T4TOTAL, FREET4, T3FREE, THYROIDAB in the last 72 hours. Anemia Panel: No results for input(s): VITAMINB12, FOLATE, FERRITIN, TIBC, IRON, RETICCTPCT in the last 72 hours. Urine analysis:    Component Value Date/Time   COLORURINE YELLOW 01/25/2022 [place] 01/25/2022 1043  LABSPEC 1.015 01/25/2022 1043   PHURINE 5.0 01/25/2022 1043   GLUCOSEU 250 (A) 01/25/2022 1043   HGBUR LARGE (A) 01/25/2022 1043   BILIRUBINUR NEGATIVE 01/25/2022 1043   KETONESUR 15 (A) 01/25/2022 1043   PROTEINUR >300 (A) 01/25/2022 1043   NITRITE POSITIVE (A) 01/25/2022 1043   LEUKOCYTESUR MODERATE (A) 01/25/2022 1043   Sepsis Labs: @LABRCNTIP (procalcitonin:4,lacticidven:4)  ) Recent Results (from the past 240 hour(s))  Resp Panel by RT-PCR (Flu A&B, Covid) Nasopharyngeal Swab     Status: None   Collection Time: 01/25/22  7:33 AM   Specimen: Nasopharyngeal Swab; Nasopharyngeal(NP) swabs in vial transport medium  Result Value Ref Range Status   SARS Coronavirus 2 by RT PCR NEGATIVE NEGATIVE Final    Comment: (NOTE) SARS-CoV-2 target nucleic acids are NOT DETECTED.  The SARS-CoV-2 RNA is generally detectable in upper respiratory specimens during the acute phase of infection. The lowest concentration of SARS-CoV-2 viral copies this assay can detect is 138 copies/mL. A negative result does not preclude SARS-Cov-2 infection and should not be used as the sole basis for treatment or other patient management decisions. A negative result may occur with  improper specimen collection/handling, submission of  specimen other than nasopharyngeal swab, presence of viral mutation(s) within the areas targeted by this assay, and inadequate number of viral copies(<138 copies/mL). A negative result must be combined with clinical observations, patient history, and epidemiological information. The expected result is Negative.  Fact Sheet for Patients:  EntrepreneurPulse.com.au  Fact Sheet for Healthcare Providers:  IncredibleEmployment.be  This test is no t yet approved or cleared by the [place] FDA and  has been authorized for detection and/or diagnosis of SARS-CoV-2 by FDA under an Emergency Use Authorization (EUA). This EUA will remain  in effect (meaning this test can be used) for the duration of the COVID-19 declaration under Section 564(b)(1) of the Act, 21 U.S.C.section 360bbb-3(b)(1), unless the authorization is terminated  or revoked sooner.       Influenza A by PCR NEGATIVE NEGATIVE Final   Influenza B by PCR NEGATIVE NEGATIVE Final    Comment: (NOTE) The Xpert Xpress SARS-CoV-2/FLU/RSV plus assay is intended as an aid in the diagnosis of influenza from Nasopharyngeal swab specimens and should not be used as a sole basis for treatment. Nasal washings and aspirates are unacceptable for Xpert Xpress SARS-CoV-2/FLU/RSV testing.  Fact Sheet for Patients: EntrepreneurPulse.com.au  Fact Sheet for Healthcare Providers: IncredibleEmployment.be  This test is not yet approved or cleared by the [place] FDA and has been authorized for detection and/or diagnosis of SARS-CoV-2 by FDA under an Emergency Use Authorization (EUA). This EUA will remain in effect (meaning this test can be used) for the duration of the COVID-19 declaration under Section 564(b)(1) of the Act, 21 U.S.C. section 360bbb-3(b)(1), unless the authorization is terminated or revoked.  Performed at [facility], [address], [place], [place] [address]   Blood Culture (routine x 2)     Status: Abnormal   Collection Time: 01/25/22  8:38 AM   Specimen: BLOOD  Result Value Ref Range Status   Specimen Description   Final    BLOOD BLOOD RIGHT FOREARM Performed at [facility], [address], [place], [place] [address]    Special Requests   Final    BOTTLES DRAWN AEROBIC AND ANAEROBIC Blood Culture results may not be optimal due to an inadequate volume of blood received in culture bottles Performed at [facility], [address] [place], [place] [address]  Culture  Setup Time   Final    GRAM NEGATIVE RODS IN BOTH AEROBIC AND ANAEROBIC BOTTLES Organism ID to follow CRITICAL RESULT CALLED TO, READ BACK BY AND VERIFIED WITH: RODNEY GRUBB 01/25/22 2040 MU    Culture ESCHERICHIA COLI (A)  Final   Report Status 01/27/2022 FINAL  Final   Organism ID, Bacteria ESCHERICHIA COLI  Final      Susceptibility   Escherichia coli - MIC*    AMPICILLIN 4 SENSITIVE Sensitive     CEFAZOLIN <=4 SENSITIVE Sensitive     CEFEPIME <=0.12 SENSITIVE Sensitive     CEFTAZIDIME <=1 SENSITIVE Sensitive     CEFTRIAXONE <=0.25 SENSITIVE Sensitive     CIPROFLOXACIN <=0.25 SENSITIVE Sensitive     GENTAMICIN <=1 SENSITIVE Sensitive     IMIPENEM <=0.25 SENSITIVE Sensitive     TRIMETH/SULFA <=20 SENSITIVE Sensitive     AMPICILLIN/SULBACTAM <=2 SENSITIVE Sensitive     PIP/TAZO <=4 SENSITIVE Sensitive     * ESCHERICHIA COLI  Blood Culture ID Panel (Reflexed)     Status: Abnormal   Collection Time: 01/25/22  8:38 AM  Result Value Ref Range Status   Enterococcus faecalis NOT DETECTED NOT DETECTED Final   Enterococcus Faecium NOT DETECTED NOT DETECTED Final   Listeria monocytogenes NOT DETECTED NOT DETECTED Final   Staphylococcus species NOT DETECTED NOT DETECTED Final   Staphylococcus aureus (BCID) NOT DETECTED NOT DETECTED Final   Staphylococcus epidermidis NOT DETECTED NOT DETECTED Final   Staphylococcus  lugdunensis NOT DETECTED NOT DETECTED Final   Streptococcus species NOT DETECTED NOT DETECTED Final   Streptococcus agalactiae NOT DETECTED NOT DETECTED Final   Streptococcus pneumoniae NOT DETECTED NOT DETECTED Final   Streptococcus pyogenes NOT DETECTED NOT DETECTED Final   A.calcoaceticus-baumannii NOT DETECTED NOT DETECTED Final   Bacteroides fragilis NOT DETECTED NOT DETECTED Final   Enterobacterales DETECTED (A) NOT DETECTED Final    Comment: Enterobacterales represent a large order of gram negative bacteria, not a single organism. CRITICAL RESULT CALLED TO, READ BACK BY AND VERIFIED WITH: RODNEY GRUBB 01/25/22 2040 MU    Enterobacter cloacae complex NOT DETECTED NOT DETECTED Final   Escherichia coli DETECTED (A) NOT DETECTED Final    Comment: CRITICAL RESULT CALLED TO, READ BACK BY AND VERIFIED WITH: RODNEY GRUBB 01/25/22 2040 MU    Klebsiella aerogenes NOT DETECTED NOT DETECTED Final   Klebsiella oxytoca NOT DETECTED NOT DETECTED Final   Klebsiella pneumoniae NOT DETECTED NOT DETECTED Final   Proteus species NOT DETECTED NOT DETECTED Final   Salmonella species NOT DETECTED NOT DETECTED Final   Serratia marcescens NOT DETECTED NOT DETECTED Final   Haemophilus influenzae NOT DETECTED NOT DETECTED Final   Neisseria meningitidis NOT DETECTED NOT DETECTED Final   Pseudomonas aeruginosa NOT DETECTED NOT DETECTED Final   Stenotrophomonas maltophilia NOT DETECTED NOT DETECTED Final   Candida albicans NOT DETECTED NOT DETECTED Final   Candida auris NOT DETECTED NOT DETECTED Final   Candida glabrata NOT DETECTED NOT DETECTED Final   Candida krusei NOT DETECTED NOT DETECTED Final   Candida parapsilosis NOT DETECTED NOT DETECTED Final   Candida tropicalis NOT DETECTED NOT DETECTED Final   Cryptococcus neoformans/gattii NOT DETECTED NOT DETECTED Final   CTX-M ESBL NOT DETECTED NOT DETECTED Final   Carbapenem resistance IMP NOT DETECTED NOT DETECTED Final   Carbapenem resistance KPC NOT  DETECTED NOT DETECTED Final   Carbapenem resistance NDM NOT DETECTED NOT DETECTED Final   Carbapenem resist OXA 48 LIKE NOT DETECTED NOT DETECTED Final  Carbapenem resistance VIM NOT DETECTED NOT DETECTED Final    Comment: Performed at [facility], [address] [place], [place] [address]  Blood Culture (routine x 2)     Status: Abnormal   Collection Time: 01/25/22  8:49 AM   Specimen: BLOOD  Result Value Ref Range Status   Specimen Description   Final    BLOOD BLOOD LEFT FOREARM Performed at [facility], [address], [place], [place] [address]    Special Requests   Final    BOTTLES DRAWN AEROBIC AND ANAEROBIC Blood Culture results may not be optimal due to an inadequate volume of blood received in culture bottles Performed at [facility], [address] [place] [address], [place], [place] [address]    Culture  Setup Time   Final    GRAM NEGATIVE RODS IN BOTH AEROBIC AND ANAEROBIC BOTTLES CRITICAL VALUE NOTED.  VALUE IS CONSISTENT WITH PREVIOUSLY REPORTED AND CALLED VALUE.    Culture (A)  Final    ESCHERICHIA COLI SUSCEPTIBILITIES PERFORMED ON PREVIOUS CULTURE WITHIN THE LAST 5 DAYS. Performed at [facility], [place] [address], [place], [place] [address]    Report Status 01/27/2022 FINAL  Final  Urine Culture     Status: None   Collection Time: 01/25/22 10:43 AM   Specimen: In/Out Cath Urine  Result Value Ref Range Status   Specimen Description   Final    IN/OUT CATH URINE Performed at [facility], [address], [place], [place] [address]    Special Requests   Final    NONE Performed at [facility], [address] [place], [place] [address]    Culture   Final    NO GROWTH Performed at [facility], [place] [address], [place], [place] [address]    Report Status 01/26/2022 FINAL  Final  MRSA Next Gen by PCR, Nasal     Status: None   Collection Time: 01/26/22  6:45 AM   Specimen: Nasal Mucosa; Nasal Swab  Result Value  Ref Range Status   MRSA by PCR Next Gen NOT DETECTED NOT DETECTED Final    Comment: (NOTE) The GeneXpert MRSA Assay (FDA approved for NASAL specimens only), is one component of a comprehensive MRSA colonization surveillance program. It is not intended to diagnose MRSA infection nor to guide or monitor treatment for MRSA infections. Test performance is not FDA approved in patients less than 20 years old. Performed at [facility], [address] [place], [place] [address]          Radiology Studies: DG Shoulder Right  Result Date: 01/26/2022 CLINICAL DATA:  Right shoulder pain since fall two days ago. EXAM: RIGHT SHOULDER - 2+ VIEW COMPARISON:  Right shoulder x-rays from yesterday. FINDINGS: No acute fracture or dislocation. Unchanged mild acromioclavicular osteoarthritis. Osteopenia. Soft tissues are unremarkable. IMPRESSION: 1. No acute osseous abnormality. Electronically Signed   By: Titus Dubin M.D.   On: 01/26/2022 15:39        Scheduled Meds:  amiodarone  400 mg Oral BID   Followed by   Derrill Memo ON 01/30/2022] amiodarone  200 mg Oral Daily   apixaban  5 mg Oral BID   cephALEXin  500 mg Oral QID   Chlorhexidine Gluconate Cloth  6 each Topical Q0600   clopidogrel  75 mg Oral Daily   donepezil  10 mg Oral QHS   furosemide  40 mg Oral Daily   gabapentin  100 mg Oral BID   insulin aspart  0-15 Units Subcutaneous TID AC & HS   levothyroxine  88 mcg Oral Daily   metoprolol tartrate  37.5 mg Oral BID   pravastatin  20 mg Oral QHS   venlafaxine XR  75 mg Oral Q breakfast   Continuous Infusions:     LOS: 3 days    Time spent: 30 min    Desma Maxim, MD [facility]   If 7PM-7AM, please contact night-coverage [email or web address] Password [email or web address] 01/28/2022, 11:19 AM

## 2022-01-28 NOTE — Progress Notes (Signed)
Patient ID: Alison Richardson, female   DOB: 03-Jun-1933, 88 y.o.   MRN: [id number]  Subjective: The patient notes moderate improvement in her right shoulder symptoms today after receiving the steroid injection yesterday afternoon.  She does feel that she is able to move her shoulder better, although she still has some pain in the shoulder.  She has no new complaints.  Objective: Vital signs in last 24 hours: Temp:  [97.6 F (36.4 C)-98.3 F (36.8 C)] 97.7 F (36.5 C) (02/05 1204) Pulse Rate:  [91-116] 91 (02/05 1204) Resp:  [16-20] 16 (02/05 1204) BP: (121-137)/(77-93) 127/77 (02/05 1204) SpO2:  [94 %-98 %] 96 % (02/05 1204)  Intake/Output from previous day: 02/04 0701 - 02/05 0700 In: 720 [P.O.:720] Out: 900 [Urine:900] Intake/Output this shift: Total I/O In: 300 [P.O.:300] Out: -   Recent Labs    01/26/22 0800 01/27/22 0446  HGB 10.8* 11.6*   Recent Labs    01/26/22 0800 01/27/22 0446  WBC 23.5* 17.3*  RBC 3.65* 4.00  HCT 33.1* 35.1*  PLT 206 228   Recent Labs    01/27/22 0446 01/28/22 0506  NA 138 138  K 3.7 4.2  CL 106 106  CO2 25 24  BUN 18 15  CREATININE 0.74 0.83  GLUCOSE 162* 243*  CALCIUM 8.3* 8.7*   No results for input(s): LABPT, INR in the last 72 hours.  Physical Exam: Orthopedic examination is limited to the right shoulder and upper extremity.  Skin inspection of the right shoulder remains unremarkable.  No swelling, erythema, ecchymosis, abrasions, or other skin abnormalities are identified.  There is mild tenderness to palpation over the anterolateral aspect of the shoulder.  Actively, she is now able to raise her arm above her head, and is able to to reach the back of her neck and to the top of her head with only mild discomfort in the shoulder.  She remains neurovascularly intact to the right upper extremity and hand.  Assessment: Right shoulder pain status post fall, improving symptomatically following steroid injection.  Plan: The patient  may continue to be mobilized with physical therapy as symptoms permit, assuming she is stable from a medical standpoint.  We will hold off on obtaining an MRI scan of the shoulder at this time, as she does note improvement in her symptoms following the injection.   Marshall Cork Goble Fudala 01/28/2022, 12:22 PM

## 2022-01-29 DIAGNOSIS — R112 Nausea with vomiting, unspecified: Secondary | ICD-10-CM | POA: Diagnosis not present

## 2022-01-29 DIAGNOSIS — W19XXXA Unspecified fall, initial encounter: Secondary | ICD-10-CM | POA: Diagnosis not present

## 2022-01-29 LAB — GLUCOSE, CAPILLARY
Glucose-Capillary: 181 mg/dL — ABNORMAL HIGH (ref 70–99)
Glucose-Capillary: 368 mg/dL — ABNORMAL HIGH (ref 70–99)
Glucose-Capillary: 158 mg/dL — ABNORMAL HIGH (ref 70–99)
Glucose-Capillary: 180 mg/dL — ABNORMAL HIGH (ref 70–99)

## 2022-01-29 LAB — RESP PANEL BY RT-PCR (FLU A&B, COVID) ARPGX2
Influenza A by PCR: NEGATIVE
Influenza B by PCR: NEGATIVE
SARS Coronavirus 2 by RT PCR: NEGATIVE

## 2022-01-29 LAB — BASIC METABOLIC PANEL
Anion gap: 8 (ref 5–15)
BUN: 23 mg/dL (ref 8–23)
CO2: 25 mmol/L (ref 22–32)
Calcium: 8.9 mg/dL (ref 8.9–10.3)
Chloride: 107 mmol/L (ref 98–111)
Creatinine, Ser: 0.83 mg/dL (ref 0.44–1.00)
GFR, Estimated: >60 mL/min (ref >60)
Glucose, Bld: 240 mg/dL — ABNORMAL HIGH (ref 70–99)
Potassium: 4.5 mmol/L (ref 3.5–5.1)
Sodium: 140 mmol/L (ref 135–145)

## 2022-01-29 MED ORDER — INSULIN ASPART 100 UNIT/ML IJ SOLN
0.0000 [IU] | Freq: Three times a day (TID) | INTRAMUSCULAR | Status: DC
  Administered 2022-01-29 – 2022-01-30 (×2): 3 [IU] via SUBCUTANEOUS
  Filled 2022-01-29: qty 1

## 2022-01-29 MED ORDER — METOPROLOL TARTRATE 50 MG PO TABS
50.0000 mg | ORAL_TABLET | Freq: Two times a day (BID) | ORAL | Status: DC
  Administered 2022-01-29 – 2022-01-30 (×3): 50 mg via ORAL
  Filled 2022-01-29 (×3): qty 1

## 2022-01-29 MED ORDER — MENTHOL 3 MG MT LOZG
1.0000 | LOZENGE | OROMUCOSAL | Status: DC | PRN
  Filled 2022-01-29 (×2): qty 9

## 2022-01-29 MED ORDER — INSULIN ASPART 100 UNIT/ML IJ SOLN
3.0000 [IU] | Freq: Three times a day (TID) | INTRAMUSCULAR | Status: DC
  Administered 2022-01-29 – 2022-01-30 (×3): 3 [IU] via SUBCUTANEOUS
  Filled 2022-01-29 (×3): qty 1

## 2022-01-29 MED ORDER — INSULIN GLARGINE-YFGN 100 UNIT/ML ~~LOC~~ SOLN
10.0000 [IU] | Freq: Every day | SUBCUTANEOUS | Status: DC
  Administered 2022-01-30: 10 [IU] via SUBCUTANEOUS
  Filled 2022-01-29: qty 0.1

## 2022-01-29 MED ORDER — DEXTROMETHORPHAN POLISTIREX ER 30 MG/5ML PO SUER
30.0000 mg | Freq: Two times a day (BID) | ORAL | Status: DC | PRN
  Administered 2022-01-29: 30 mg via ORAL
  Filled 2022-01-29 (×4): qty 5

## 2022-01-29 MED ORDER — LISINOPRIL 5 MG PO TABS
5.0000 mg | ORAL_TABLET | Freq: Every day | ORAL | Status: DC
  Administered 2022-01-29 – 2022-01-30 (×2): 5 mg via ORAL
  Filled 2022-01-29 (×2): qty 1

## 2022-01-29 MED ORDER — INSULIN GLARGINE-YFGN 100 UNIT/ML ~~LOC~~ SOLN
5.0000 [IU] | Freq: Once | SUBCUTANEOUS | Status: AC
  Administered 2022-01-29: 5 [IU] via SUBCUTANEOUS
  Filled 2022-01-29 (×3): qty 0.05

## 2022-01-29 MED ORDER — INSULIN ASPART 100 UNIT/ML IJ SOLN: 0.0000 [IU] | Freq: Every day | INTRAMUSCULAR | Status: DC

## 2022-01-29 NOTE — Progress Notes (Signed)
Physical Therapy Treatment Patient Details Name: Alison Richardson MRN: [id number] DOB: 1933-08-10 Today's Date: 01/29/2022   History of Present Illness Pt is an 88 y/o F admitted on 01/25/22 after presenting with c/o increased urinary frequency/urgency x 1 week. 1 episode of N&V & fall to the floor, injuring RUE & shoulder, unable to get up so spent the night on the floor. Pt was tx for presumed sepsis & developed acute SOB & RVR & was initially placed on bipap. CXR shows increase in interstitial opacities compared with cxr on presentation, suggestive of pulmonary edema. RUE shoulder/arm x-ray negative for fx. PMH: a-fib, DM, HTN, hypothyroid, PAD, combined systolic/diastolic CHF, CAD, NSTEMI    PT Comments    Pt seen for PT tx with pt agreeable. Pt found to be covered in urine 2/2 purewick malfunction & PT assists with changing out of soiled clothing into gown. Pt demonstrates impaired balance & LOB when attempting to pull pants below hips when standing without UE support & has difficulty dressing upper body 2/2 RUE shoulder weakness & pain. Pt is able to ambulate 2 laps around nurses station - 1 with RW & supervision, 1 without AD & CGA with impaired gait pattern as noted below. Pt reports at home she typically ambulates without AD in her apartment & cane in community. Back in room pt performs strengthening & balance retraining exercises to help reduce fall risk. Pt reports she does not have anyone that can stay with her/assist her in her [place] apartment. Because of this, and because pt continues to demonstrate impaired balance & ability to ambulate with LRAD compared to her PLOF, continue to recommend STR upon d/c to maximize independence with mobility & reduce fall risk prior to return home alone.    Recommendations for follow up therapy are one component of a multi-disciplinary discharge planning process, led by the attending physician.  Recommendations may be updated based on patient status, additional  functional criteria and insurance authorization.  Follow Up Recommendations  Skilled nursing-short term rehab (<3 hours/day)     Assistance Recommended at Discharge Intermittent Supervision/Assistance  Patient can return home with the following Assistance with cooking/housework;Direct supervision/assist for financial management;Assist for transportation;A little help with walking and/or transfers;A little help with bathing/dressing/bathroom   Equipment Recommendations  Rolling walker (2 wheels)    Recommendations for Other Services       Precautions / Restrictions Precautions Precautions: Fall Precaution Comments: limited R shoulder movement 2/2 weakness/pain Restrictions Weight Bearing Restrictions: No     Mobility  Bed Mobility Overal bed mobility: Modified Independent Bed Mobility: Supine to Sit     Supine to sit: HOB elevated, Modified independent (Device/Increase time)     General bed mobility comments: bed rails, extra time to upright trunk    Transfers Overall transfer level: Needs assistance Equipment used: Rolling walker (2 wheels), None Transfers: Sit to/from Stand Sit to Stand: Supervision, Min guard (CGA sit<>stand without UE support nor AD, supervision sit>stand with RW)                Ambulation/Gait Ambulation/Gait assistance: Min guard, Supervision Gait Distance (Feet):  (170 ft + 30 ft with RW & supervision + 160 ft without AD & CGA) Assistive device: Rolling walker (2 wheels), None Gait Pattern/deviations: Decreased step length - right, Decreased step length - left, Decreased stride length Gait velocity: decreased     General Gait Details: When ambulating without AD pt with guarded gait & BUE posture.   Stairs  Wheelchair Mobility    Modified Rankin (Stroke Patients Only)       Balance Overall balance assessment: Needs assistance Sitting-balance support: Feet supported, No upper extremity supported Sitting  balance-Leahy Scale: Good     Standing balance support: No upper extremity supported, During functional activity Standing balance-Leahy Scale: Fair Standing balance comment: Pt attempts to pull pants below hips when standing without BUE support but experiences 1 LOB & ultimately requires 1UE support for safety/balance.                            Cognition Arousal/Alertness: Awake/alert Behavior During Therapy: WFL for tasks assessed/performed Overall Cognitive Status: Within Functional Limits for tasks assessed                                 General Comments: Pleasant lady        Exercises Other Exercises Other Exercises: Pt performed 10x sit<>stand without BUE support with CGA for BLE strengthening. Other Exercises: Pt engaged in balance retraining exercises: standing with narrow BOS without UE support with eyes open & closed, CGA<>mod assist 2/2 1 LOB    General Comments General comments (skin integrity, edema, etc.): HR 101-117 bpm during session.      Pertinent Vitals/Pain Pain Assessment Pain Assessment: Faces Faces Pain Scale: Hurts little more Pain Location: when attempting to move R shoulder (more so when lowering than when flexing shoulder) Pain Descriptors / Indicators: Discomfort Pain Intervention(s): Monitored during session, Repositioned    Home Living                          Prior Function            PT Goals (current goals can now be found in the care plan section) Acute Rehab PT Goals Patient Stated Goal: get better, go to rehab at [facility] PT Goal Formulation: With patient/family Time For Goal Achievement: 02/09/22 Potential to Achieve Goals: Good Progress towards PT goals: Progressing toward goals    Frequency    Min 2X/week      PT Plan Current plan remains appropriate    Co-evaluation              AM-PAC PT "6 Clicks" Mobility   Outcome Measure  Help needed turning from your back to your  side while in a flat bed without using bedrails?: None Help needed moving from lying on your back to sitting on the side of a flat bed without using bedrails?: None Help needed moving to and from a bed to a chair (including a wheelchair)?: A Little Help needed standing up from a chair using your arms (e.g., wheelchair or bedside chair)?: A Little Help needed to walk in hospital room?: A Little Help needed climbing 3-5 steps with a railing? : A Little 6 Click Score: 20    End of Session Equipment Utilized During Treatment: Gait belt Activity Tolerance: Patient tolerated treatment well Patient left: in chair;with call bell/phone within reach Nurse Communication:  (pt requesting bath) PT Visit Diagnosis: Muscle weakness (generalized) (M62.81);Unsteadiness on feet (R26.81)     Time: 1517-6160 PT Time Calculation (min) (ACUTE ONLY): 23 min  Charges:  $Therapeutic Activity: 8-22 mins $Neuromuscular Re-education: 8-22 mins                     Lavone Nian, PT, DPT 01/29/22, 10:15  AM    Waunita Schooner 01/29/2022, 10:12 AM

## 2022-01-29 NOTE — Progress Notes (Signed)
PROGRESS NOTE    Alison Richardson  ZOX:[id number] DOB: 1933/06/09 DOA: 01/25/2022 PCP: Jon Billings, NP  Outpatient Specialists: cardiology    Brief Narrative:   From admission h and p Alison Richardson is a 88 y.o. female with medical history significant for a fib, dm, htn, hypothyroid, PAD, combined systolic/diastolic chf, cad/nstemi, who presents with the above.   Reports about a week of increased urinary frequency/urgency. No dysuria. No fevers. Yesterday felt nauseaus, went to bathroom and vomited. Fell to floor, injuring right arm and shoulder. Was unable to get up and call for help and so spent the night on the floor. No chest pain or dyspnea. No fevers. No diarrhea. No other vomiting.    In the ED was found to be leukycotitic with aki of 1.4 and elevated lactate. Was treated for presumed sepsis with antibiotics and IV fluids.   Shortly prior to my evaluation the patient developed acute shortness of breath and RVR. She was placed on bipap and given 80 IV lasix. CXR shows increase in interstitial opacities compared with cxr on presentation, suggestive of pulmonary edema.    Assessment & Plan:   Principal Problem:   Fall Active Problems:   Paroxysmal atrial fibrillation with rapid ventricular response (HCC)   Vitamin B12 deficiency   Type 2 diabetes mellitus with diabetic neuropathy (HCC)   Benign essential hypertension   Obstructive sleep apnea   Acute cystitis   # Acute cystitis # Sepsis # E. Coli bacteremia One week urinary frequency, one episode vomiting at home, urinalysis suggestive of infection. The likely inciting event for her other hospital problems. Sepsis by leukocytosis, tachycardia, lactic acidosis. S/p fluid resuscitation. GNRs growing in 4/4. Abx started after UA was obtained so though urine culture is neg suspect that to be the source. Symptomatically improved. Cultures pan-sensitive - stopped ceftriaxone (2/2-2/4), discussed w/ pharmacy, started keflex 500  q6 on 2/4, plan for 7 day course   # A-fib with rvr # Flash pulmonary edema # Acute hypoxic respiratory failure Treated for sepsis with liberal fluids in the ED. Developed flash pulmonary edema and a fib with rvr. Treated with bipap and lasix. Cardiology now following. Weaned off bipap and now off o2. Amio gtt discontinued  - plan for amio is 400 bid for 3 days, then 200 qd - po lasix 40 - metoprolol added back  - con eliquis   # Demand ischemia Trop 811>914, no chest pain, likely demand from above processes - monitor   # Hypokalemia Resolved w/ supplementation - monitor   # T2DM Here hyperglycemic - SSI - added semglee 5   # Fall # Shoulder injury With right shoulder/arm pain. X-ray neg for fracture. Normal rom hip without pain. Unable to abduct arm, in pain, family very concerned. Ortho consulted 2/4, thinks possible rotator cuff injury, CSI provided. Pain somewhat improved today - cont PT, advising SNF, insurance auth pending. Requiring peer to peer, will call today - ortho f/u prn   # Acute kidney injury 2/2 uti, immobility. Resolved w/ fluids - monitor   # Neuropathy - home effexor, gabapentin   # Hypothyroid - home levothyroxine   # CAD - home eliquis, plavix, statin     DVT prophylaxis: home eliquis Code Status: dnr Family Communication:  son updated @ bedside 2/5  Level of care: Progressive Status is: Inpatient Remains inpatient appropriate because: unsafe d/c plan            Consultants:  cardiology  Procedures: none  Antimicrobials:  Cefepime>ceftriaxone>keflex  Subjective: Ongoing right shoulder pain, no other pain, no sob or cough. Shoulder pain improving.  Objective: Vitals:   01/29/22 0509 01/29/22 0755 01/29/22 1139 01/29/22 1557  BP: (!) 137/94 138/88 118/78 127/79  Pulse:  89 94 86  Resp: 19 20 20 16   Temp: 97.9 F (36.6 C) 97.8 F (36.6 C) (!) 97.5 F (36.4 C) 98.1 F (36.7 C)  TempSrc: Oral  Oral Oral  SpO2:  98% 97% 96% 97%  Weight:      Height:        Intake/Output Summary (Last 24 hours) at 01/29/2022 1637 Last data filed at 01/29/2022 0900 Gross per 24 hour  Intake 580 ml  Output 2551 ml  Net -1971 ml   Filed Weights   01/25/22 0722  Weight: 80.7 kg    Examination:  General exam: Appears calm and comfortable  Respiratory system: Clear to auscultation save for rales at bases Cardiovascular system: S1 & S2 heard, RRR. No JVD, murmurs, rubs, gallops or clicks. No pedal edema. Gastrointestinal system: Abdomen is nondistended, soft and nontender. No organomegaly or masses felt. Normal bowel sounds heard. Central nervous system: Alert. No focal neurological deficits. MSK: unable to abduct right shoulder above 90 Skin: No rashes, lesions or ulcers Psychiatry: mildly confused, calm    Data Reviewed: I have personally reviewed following labs and imaging studies  CBC: Recent Labs  Lab 01/25/22 0733 01/26/22 0800 01/27/22 0446  WBC 27.7* 23.5* 17.3*  HGB 13.7 10.8* 11.6*  HCT 41.7 33.1* 35.1*  MCV 88.3 90.7 87.8  PLT 277 206 884   Basic Metabolic Panel: Recent Labs  Lab 01/25/22 0733 01/25/22 0954 01/26/22 0800 01/26/22 0928 01/27/22 0446 01/28/22 0506 01/29/22 0452  NA 138  --  139  --  138 138 140  K 3.2*  --  3.4*  --  3.7 4.2 4.5  CL 98  --  108  --  106 106 107  CO2 27  --  22  --  25 24 25   GLUCOSE 256*  --  127*  --  162* 243* 240*  BUN 21  --  23  --  18 15 23   CREATININE 1.41*  --  1.10*  --  0.74 0.83 0.83  CALCIUM 8.9  --  7.6*  --  8.3* 8.7* 8.9  MG  --  1.7  --  1.6* 2.1 2.2  --    GFR: Estimated Creatinine Clearance: 52.2 mL/min (by C-G formula based on SCr of 0.83 mg/dL). Liver Function Tests: Recent Labs  Lab 01/25/22 0733  AST 37  ALT 20  ALKPHOS 82  BILITOT 0.9  PROT 7.8  ALBUMIN 3.9   No results for input(s): LIPASE, AMYLASE in the last 168 hours. No results for input(s): AMMONIA in the last 168 hours. Coagulation Profile: Recent Labs   Lab 01/25/22 0848  INR 1.5*   Cardiac Enzymes: Recent Labs  Lab 01/25/22 0742  CKTOTAL 370*   BNP (last 3 results) No results for input(s): PROBNP in the last 8760 hours. HbA1C: No results for input(s): HGBA1C in the last 72 hours. CBG: Recent Labs  Lab 01/28/22 1627 01/28/22 2046 01/29/22 0757 01/29/22 1140 01/29/22 1559  GLUCAP 193* 176* 181* 368* 158*   Lipid Profile: No results for input(s): CHOL, HDL, LDLCALC, TRIG, CHOLHDL, LDLDIRECT in the last 72 hours. Thyroid Function Tests: No results for input(s): TSH, T4TOTAL, FREET4, T3FREE, THYROIDAB in the last 72 hours. Anemia Panel: No results for input(s): VITAMINB12, FOLATE, FERRITIN, TIBC, IRON, RETICCTPCT  in the last 72 hours. Urine analysis:    Component Value Date/Time   COLORURINE YELLOW 01/25/2022 1043   APPEARANCEUR CLEAR 01/25/2022 1043   LABSPEC 1.015 01/25/2022 1043   PHURINE 5.0 01/25/2022 1043   GLUCOSEU 250 (A) 01/25/2022 1043   HGBUR LARGE (A) 01/25/2022 1043   BILIRUBINUR NEGATIVE 01/25/2022 1043   KETONESUR 15 (A) 01/25/2022 1043   PROTEINUR >300 (A) 01/25/2022 1043   NITRITE POSITIVE (A) 01/25/2022 1043   LEUKOCYTESUR MODERATE (A) 01/25/2022 1043   Sepsis Labs: @LABRCNTIP (procalcitonin:4,lacticidven:4)  ) Recent Results (from the past 240 hour(s))  Resp Panel by RT-PCR (Flu A&B, Covid) Nasopharyngeal Swab     Status: None   Collection Time: 01/25/22  7:33 AM   Specimen: Nasopharyngeal Swab; Nasopharyngeal(NP) swabs in vial transport medium  Result Value Ref Range Status   SARS Coronavirus 2 by RT PCR NEGATIVE NEGATIVE Final    Comment: (NOTE) SARS-CoV-2 target nucleic acids are NOT DETECTED.  The SARS-CoV-2 RNA is generally detectable in upper respiratory specimens during the acute phase of infection. The lowest concentration of SARS-CoV-2 viral copies this assay can detect is 138 copies/mL. A negative result does not preclude SARS-Cov-2 infection and should not be used as the sole  basis for treatment or other patient management decisions. A negative result may occur with  improper specimen collection/handling, submission of specimen other than nasopharyngeal swab, presence of viral mutation(s) within the areas targeted by this assay, and inadequate number of viral copies(<138 copies/mL). A negative result must be combined with clinical observations, patient history, and epidemiological information. The expected result is Negative.  Fact Sheet for Patients:  EntrepreneurPulse.com.au  Fact Sheet for Healthcare Providers:  IncredibleEmployment.be  This test is no t yet approved or cleared by the [place] FDA and  has been authorized for detection and/or diagnosis of SARS-CoV-2 by FDA under an Emergency Use Authorization (EUA). This EUA will remain  in effect (meaning this test can be used) for the duration of the COVID-19 declaration under Section 564(b)(1) of the Act, 21 U.S.C.section 360bbb-3(b)(1), unless the authorization is terminated  or revoked sooner.       Influenza A by PCR NEGATIVE NEGATIVE Final   Influenza B by PCR NEGATIVE NEGATIVE Final    Comment: (NOTE) The Xpert Xpress SARS-CoV-2/FLU/RSV plus assay is intended as an aid in the diagnosis of influenza from Nasopharyngeal swab specimens and should not be used as a sole basis for treatment. Nasal washings and aspirates are unacceptable for Xpert Xpress SARS-CoV-2/FLU/RSV testing.  Fact Sheet for Patients: EntrepreneurPulse.com.au  Fact Sheet for Healthcare Providers: IncredibleEmployment.be  This test is not yet approved or cleared by the [place] FDA and has been authorized for detection and/or diagnosis of SARS-CoV-2 by FDA under an Emergency Use Authorization (EUA). This EUA will remain in effect (meaning this test can be used) for the duration of the COVID-19 declaration under Section 564(b)(1) of the Act,  21 U.S.C. section 360bbb-3(b)(1), unless the authorization is terminated or revoked.  Performed at [facility], [address] [place], [place] [address]   Blood Culture (routine x 2)     Status: Abnormal   Collection Time: 01/25/22  8:38 AM   Specimen: BLOOD  Result Value Ref Range Status   Specimen Description   Final    BLOOD BLOOD RIGHT FOREARM Performed at [facility], [address] [place], [place] [address]    Special Requests   Final    BOTTLES DRAWN AEROBIC AND ANAEROBIC Blood Culture results  may not be optimal due to an inadequate volume of blood received in culture bottles Performed at [facility], [address] [place], [place] [address]    Culture  Setup Time   Final    GRAM NEGATIVE RODS IN BOTH AEROBIC AND ANAEROBIC BOTTLES Organism ID to follow CRITICAL RESULT CALLED TO, READ BACK BY AND VERIFIED WITH: RODNEY GRUBB 01/25/22 2040 MU    Culture ESCHERICHIA COLI (A)  Final   Report Status 01/27/2022 FINAL  Final   Organism ID, Bacteria ESCHERICHIA COLI  Final      Susceptibility   Escherichia coli - MIC*    AMPICILLIN 4 SENSITIVE Sensitive     CEFAZOLIN <=4 SENSITIVE Sensitive     CEFEPIME <=0.12 SENSITIVE Sensitive     CEFTAZIDIME <=1 SENSITIVE Sensitive     CEFTRIAXONE <=0.25 SENSITIVE Sensitive     CIPROFLOXACIN <=0.25 SENSITIVE Sensitive     GENTAMICIN <=1 SENSITIVE Sensitive     IMIPENEM <=0.25 SENSITIVE Sensitive     TRIMETH/SULFA <=20 SENSITIVE Sensitive     AMPICILLIN/SULBACTAM <=2 SENSITIVE Sensitive     PIP/TAZO <=4 SENSITIVE Sensitive     * ESCHERICHIA COLI  Blood Culture ID Panel (Reflexed)     Status: Abnormal   Collection Time: 01/25/22  8:38 AM  Result Value Ref Range Status   Enterococcus faecalis NOT DETECTED NOT DETECTED Final   Enterococcus Faecium NOT DETECTED NOT DETECTED Final   Listeria monocytogenes NOT DETECTED NOT DETECTED Final   Staphylococcus species NOT DETECTED NOT DETECTED Final    Staphylococcus aureus (BCID) NOT DETECTED NOT DETECTED Final   Staphylococcus epidermidis NOT DETECTED NOT DETECTED Final   Staphylococcus lugdunensis NOT DETECTED NOT DETECTED Final   Streptococcus species NOT DETECTED NOT DETECTED Final   Streptococcus agalactiae NOT DETECTED NOT DETECTED Final   Streptococcus pneumoniae NOT DETECTED NOT DETECTED Final   Streptococcus pyogenes NOT DETECTED NOT DETECTED Final   A.calcoaceticus-baumannii NOT DETECTED NOT DETECTED Final   Bacteroides fragilis NOT DETECTED NOT DETECTED Final   Enterobacterales DETECTED (A) NOT DETECTED Final    Comment: Enterobacterales represent a large order of gram negative bacteria, not a single organism. CRITICAL RESULT CALLED TO, READ BACK BY AND VERIFIED WITH: RODNEY GRUBB 01/25/22 2040 MU    Enterobacter cloacae complex NOT DETECTED NOT DETECTED Final   Escherichia coli DETECTED (A) NOT DETECTED Final    Comment: CRITICAL RESULT CALLED TO, READ BACK BY AND VERIFIED WITH: RODNEY GRUBB 01/25/22 2040 MU    Klebsiella aerogenes NOT DETECTED NOT DETECTED Final   Klebsiella oxytoca NOT DETECTED NOT DETECTED Final   Klebsiella pneumoniae NOT DETECTED NOT DETECTED Final   Proteus species NOT DETECTED NOT DETECTED Final   Salmonella species NOT DETECTED NOT DETECTED Final   Serratia marcescens NOT DETECTED NOT DETECTED Final   Haemophilus influenzae NOT DETECTED NOT DETECTED Final   Neisseria meningitidis NOT DETECTED NOT DETECTED Final   Pseudomonas aeruginosa NOT DETECTED NOT DETECTED Final   Stenotrophomonas maltophilia NOT DETECTED NOT DETECTED Final   Candida albicans NOT DETECTED NOT DETECTED Final   Candida auris NOT DETECTED NOT DETECTED Final   Candida glabrata NOT DETECTED NOT DETECTED Final   Candida krusei NOT DETECTED NOT DETECTED Final   Candida parapsilosis NOT DETECTED NOT DETECTED Final   Candida tropicalis NOT DETECTED NOT DETECTED Final   Cryptococcus neoformans/gattii NOT DETECTED NOT DETECTED Final    CTX-M ESBL NOT DETECTED NOT DETECTED Final   Carbapenem resistance IMP NOT DETECTED NOT DETECTED Final   Carbapenem  resistance KPC NOT DETECTED NOT DETECTED Final   Carbapenem resistance NDM NOT DETECTED NOT DETECTED Final   Carbapenem resist OXA 48 LIKE NOT DETECTED NOT DETECTED Final   Carbapenem resistance VIM NOT DETECTED NOT DETECTED Final    Comment: Performed at [facility], [address], [place], [place] [address]  Blood Culture (routine x 2)     Status: Abnormal   Collection Time: 01/25/22  8:49 AM   Specimen: BLOOD  Result Value Ref Range Status   Specimen Description   Final    BLOOD BLOOD LEFT FOREARM Performed at [facility], [address] [place], [place] [address]    Special Requests   Final    BOTTLES DRAWN AEROBIC AND ANAEROBIC Blood Culture results may not be optimal due to an inadequate volume of blood received in culture bottles Performed at [facility], [address] [place], [place] [address]    Culture  Setup Time   Final    GRAM NEGATIVE RODS IN BOTH AEROBIC AND ANAEROBIC BOTTLES CRITICAL VALUE NOTED.  VALUE IS CONSISTENT WITH PREVIOUSLY REPORTED AND CALLED VALUE.    Culture (A)  Final    ESCHERICHIA COLI SUSCEPTIBILITIES PERFORMED ON PREVIOUS CULTURE WITHIN THE LAST 5 DAYS. Performed at [facility], [place] [address], [place], [place] [address]    Report Status 01/27/2022 FINAL  Final  Urine Culture     Status: None   Collection Time: 01/25/22 10:43 AM   Specimen: In/Out Cath Urine  Result Value Ref Range Status   Specimen Description   Final    IN/OUT CATH URINE Performed at [facility], [address], [place], [place] [address]    Special Requests   Final    NONE Performed at [facility], [address], [place], [place] [address]    Culture   Final    NO GROWTH Performed at [facility], [place] [address] [place], [place] [address]    Report Status 01/26/2022 FINAL  Final   MRSA Next Gen by PCR, Nasal     Status: None   Collection Time: 01/26/22  6:45 AM   Specimen: Nasal Mucosa; Nasal Swab  Result Value Ref Range Status   MRSA by PCR Next Gen NOT DETECTED NOT DETECTED Final    Comment: (NOTE) The GeneXpert MRSA Assay (FDA approved for NASAL specimens only), is one component of a comprehensive MRSA colonization surveillance program. It is not intended to diagnose MRSA infection nor to guide or monitor treatment for MRSA infections. Test performance is not FDA approved in patients less than 70 years old. Performed at [facility], [address] [place], [place] [address]   Resp Panel by RT-PCR (Flu A&B, Covid) Nasopharyngeal Swab     Status: None   Collection Time: 01/29/22  3:13 PM   Specimen: Nasopharyngeal Swab; Nasopharyngeal(NP) swabs in vial transport medium  Result Value Ref Range Status   SARS Coronavirus 2 by RT PCR NEGATIVE NEGATIVE Final    Comment: (NOTE) SARS-CoV-2 target nucleic acids are NOT DETECTED.  The SARS-CoV-2 RNA is generally detectable in upper respiratory specimens during the acute phase of infection. The lowest concentration of SARS-CoV-2 viral copies this assay can detect is 138 copies/mL. A negative result does not preclude SARS-Cov-2 infection and should not be used as the sole basis for treatment or other patient management decisions. A negative result may occur with  improper specimen collection/handling, submission of specimen other than nasopharyngeal swab, presence of  viral mutation(s) within the areas targeted by this assay, and inadequate number of viral copies(<138 copies/mL). A negative result must be combined with clinical observations, patient history, and epidemiological information. The expected result is Negative.  Fact Sheet for Patients:  EntrepreneurPulse.com.au  Fact Sheet for Healthcare Providers:  IncredibleEmployment.be  This test is no t yet  approved or cleared by the [place] FDA and  has been authorized for detection and/or diagnosis of SARS-CoV-2 by FDA under an Emergency Use Authorization (EUA). This EUA will remain  in effect (meaning this test can be used) for the duration of the COVID-19 declaration under Section 564(b)(1) of the Act, 21 U.S.C.section 360bbb-3(b)(1), unless the authorization is terminated  or revoked sooner.       Influenza A by PCR NEGATIVE NEGATIVE Final   Influenza B by PCR NEGATIVE NEGATIVE Final    Comment: (NOTE) The Xpert Xpress SARS-CoV-2/FLU/RSV plus assay is intended as an aid in the diagnosis of influenza from Nasopharyngeal swab specimens and should not be used as a sole basis for treatment. Nasal washings and aspirates are unacceptable for Xpert Xpress SARS-CoV-2/FLU/RSV testing.  Fact Sheet for Patients: EntrepreneurPulse.com.au  Fact Sheet for Healthcare Providers: IncredibleEmployment.be  This test is not yet approved or cleared by the [place] FDA and has been authorized for detection and/or diagnosis of SARS-CoV-2 by FDA under an Emergency Use Authorization (EUA). This EUA will remain in effect (meaning this test can be used) for the duration of the COVID-19 declaration under Section 564(b)(1) of the Act, 21 U.S.C. section 360bbb-3(b)(1), unless the authorization is terminated or revoked.  Performed at [facility], [address] [place], [place] [address]          Radiology Studies: No results found.      Scheduled Meds:  amiodarone  400 mg Oral BID   Followed by   Derrill Memo ON 01/30/2022] amiodarone  200 mg Oral Daily   apixaban  5 mg Oral BID   cephALEXin  500 mg Oral QID   Chlorhexidine Gluconate Cloth  6 each Topical Q0600   clopidogrel  75 mg Oral Daily   donepezil  10 mg Oral QHS   furosemide  40 mg Oral Daily   gabapentin  100 mg Oral BID   insulin aspart  0-15 Units Subcutaneous TID WC    insulin aspart  0-5 Units Subcutaneous QHS   insulin aspart  3 Units Subcutaneous TID WC   [START ON 01/30/2022] insulin glargine-yfgn  10 Units Subcutaneous Daily   insulin glargine-yfgn  5 Units Subcutaneous Once   levothyroxine  88 mcg Oral Daily   lisinopril  5 mg Oral Daily   metoprolol tartrate  50 mg Oral BID   pravastatin  20 mg Oral QHS   venlafaxine XR  75 mg Oral Q breakfast   Continuous Infusions:     LOS: 4 days    Time spent: 30 min    Desma Maxim, MD [facility]   If 7PM-7AM, please contact night-coverage [email or web address] Password [facility] 01/29/2022, 4:37 PM

## 2022-01-29 NOTE — Progress Notes (Signed)
Inpatient Diabetes Program Recommendations  AACE/ADA: New Consensus Statement on Inpatient Glycemic Control (2015)  Target Ranges:  Prepandial:   less than 140 mg/dL      Peak postprandial:   less than 180 mg/dL (1-2 hours)      Critically ill patients:  140 - 180 mg/dL   Lab Results  Component Value Date   GLUCAP 181 (H) 01/29/2022   HGBA1C 7.1 (H) 01/11/2022    Review of Glycemic Control  Latest Reference Range & Units 01/28/22 08:12 01/28/22 12:08 01/28/22 16:27 01/28/22 20:46 01/29/22 07:57  Glucose-Capillary 70 - 99 mg/dL 193 (H) 207 (H) 193 (H) 176 (H) 181 (H)  (H): Data is abnormally high  Diabetes history: DM2 Outpatient Diabetes medications: Glucotrol 2.5 qd Current orders for Inpatient glycemic control: Semglee 5 units qd, Novolog 0-15 units correction qid  Inpatient Diabetes Program Recommendations:   -Decrease hs Novolog correction to 0-5 units Secure chat sent to Dr. Si Raider.  Thank you, Nani Gasser. Rocco Kerkhoff, RN, MSN, CDE  Diabetes Coordinator Inpatient Glycemic Control Team Team Pager [phone] (8am-5pm) 01/29/2022 10:04 AM

## 2022-01-29 NOTE — TOC Progression Note (Addendum)
Transition of Care [facility]) - Progression Note    Patient Details  Name: Alison Richardson MRN: [id number] Date of Birth: 1933-01-12  [facility] CM/SW [place], [place] Phone Number: 01/29/2022, 9:45 AM  Clinical Narrative:     Update #2: auth went to peer to peer, MD informed of  below: p2p offer, MD to call [phone] option 5, needs member name/dob. Final deadline is 10:30 am ET on 01/30/22. Thank you.   Update: updated PT note from today sent to insurance, pending auth at this time. Patient and daughter [name] have been updated.   CSW uploaded additional clinicals to [employer] per their request, they are continuing to request updated PT notes by 2pm, CSW has informed PT.   Expected Discharge Plan: [place] Barriers to Discharge: Continued Medical Work up  Expected Discharge Plan and Services Expected Discharge Plan: [place]   Discharge Planning Services: CM Consult Post Acute Care Choice: [place] Living arrangements for the past 2 months: [place]                 DME Arranged: N/A DME Agency: NA                   Social Determinants of Health (SDOH) Interventions    Readmission Risk Interventions Readmission Risk Prevention Plan 01/26/2022 11/02/2021  Transportation Screening Complete Complete  PCP or Specialist Appt within 3-5 Days Complete Complete  HRI or Home Care Consult Complete Complete  Social Work Consult for [place] Planning/Counseling Complete Complete  Palliative Care Screening Not Applicable Not Applicable  Medication Review Press photographer) Complete Complete  Some recent data might be hidden

## 2022-01-29 NOTE — Progress Notes (Signed)
[place] NOTE       Patient ID: Ranya Fiddler MRN: [id number] DOB/AGE: 1933-04-22 88 y.o.  Admit date: 01/25/2022 Referring Physician Dr. Si Raider  Primary Physician Dr. Emily Filbert Primary Cardiologist Dr. Corky Sox  Reason for Consultation AF RVR  HPI: The patient is a 88yoF with PMH significant for paroxysmal atrial fibrillation on amiodarone and Eliquis, CAD with history of NSTEMI 09/2021 with LHC showing 99% proximal RCA stenosis and was treated medically, HFrEF (LVEF 40-45 by LV gram), HTN, hyperlipidemia, type 2 diabetes who presented to [facility] ED the morning of 01/25/22 after a fall, injuring her right arm. She spent the night on the floor of her apartment unable to get up. She is admitted with sepsis from a UTI with E coli bacteremia. Cardiology is consulted for assistance with her AF with RVR.   Interval history: - HR much improved.  - Feels well this morning no complaints. Denies SOB, chest pain.   Review of systems complete and found to be negative unless listed above     Past Medical History:  Diagnosis Date   Arrhythmia    Atrial flutter ([place]) 01/2018   new onset    Basal cell carcinoma of back    Basal cell carcinoma of lip    Cerebral aneurysm    followed by [facility]   CHF (congestive heart failure) (HCC)    DDD (degenerative disc disease), lumbar    superior plate depression, L3 08/18/2014   Diabetes mellitus type II, controlled ([place])    Diverticulosis    Dysrhythmia    Paroxysmal Supraventricular Tachycardia   Dysthymia    depression   GERD (gastroesophageal reflux disease)    History of meniscal tear    Hyperlipidemia    Hypertension    Hypothyroidism    Late onset Alzheimer's disease with behavioral disturbance ([place])    Mild pulmonary hypertension ([place])    Overactive bladder    Peripheral vascular disease ([place])    Seasonal allergic rhinitis    Sleep apnea     Past Surgical History:  Procedure Laterality Date   APPENDECTOMY  1946    BASAL CELL CARCINOMA EXCISION     2006 and 2009 removed from back and lip   CARDIOVASCULAR STRESS TEST  2015   nuclear cardiac stress test negative for ischemia - Dr. Satira Sark   CATARACT EXTRACTION, BILATERAL  2006   COLONOSCOPY  2014   COLONOSCOPY N/A 08/19/2020   Procedure: COLONOSCOPY;  Surgeon: Lesly Rubenstein, MD;  Location: [facility] ENDOSCOPY;  Service: Endoscopy;  Laterality: N/A;   ECTOPIC PREGNANCY SURGERY  1957   ESOPHAGOGASTRODUODENOSCOPY N/A 08/19/2020   Procedure: ESOPHAGOGASTRODUODENOSCOPY (EGD);  Surgeon: Lesly Rubenstein, MD;  Location: [facility] END[facility]COPY;  Service: Endoscopy;  Laterality: N/A;   INJECTION KNEE Left 05/2015   LEFT HEART CATH AND CORONARY ANGIOGRAPHY N/A 10/12/2021   Procedure: LEFT HEART CATH AND CORONARY ANGIOGRAPHY;  Surgeon: Corey Skains, MD;  Location: [facility] CV LAB;  Service: Cardiovascular;  Laterality: N/A;   REPLACEMENT TOTAL KNEE Left 09/06/2016   Dr. Barnet Pall   TONSILLECTOMY AND ADENOIDECTOMY  1953   TOTAL ABDOMINAL HYSTERECTOMY  1986   [facility]    Medications Prior to Admission  Medication Sig Dispense Refill Last Dose   amiodarone (PACERONE) 200 MG tablet Take 200 mg by mouth daily.    01/24/2022 at 1000   clopidogrel (PLAVIX) 75 MG tablet Take 75 mg by mouth daily.   01/24/2022 at 1000   donepezil (ARICEPT) 10 MG tablet  Take 10 mg by mouth at bedtime.   01/24/2022 at 1000   ELIQUIS 5 MG TABS tablet Take 5 mg by mouth 2 (two) times daily.    01/24/2022 at 1000   glipiZIDE (GLUCOTROL XL) 2.5 MG 24 hr tablet Take 2.5 mg by mouth daily.   01/24/2022 at 1000   isosorbide mononitrate (IMDUR) 30 MG 24 hr tablet Take 30 mg by mouth daily.   01/24/2022 at 1000   levothyroxine (SYNTHROID) 88 MCG tablet Take 88 mcg by mouth daily.    01/24/2022 at 1000   lisinopril (ZESTRIL) 5 MG tablet Take 5 mg by mouth daily.   01/24/2022 at 1000   metoprolol succinate (TOPROL-XL) 100 MG 24 hr tablet TAKE ONE TABLET (100 MG) BY MOUTH EVERY DAY (TAKE WITH OR IMMEDIATELY AFTER A  MEAL) 90 tablet 1 01/24/2022 at 1000   montelukast (SINGULAIR) 10 MG tablet Take 1 tablet (10 mg total) by mouth at bedtime. 30 tablet 0 01/24/2022 at 1000   pravastatin (PRAVACHOL) 20 MG tablet Take 20 mg by mouth at bedtime.   01/24/2022 at 2000   torsemide (DEMADEX) 20 MG tablet Take 1 tablet (20 mg total) by mouth daily. 30 tablet 0 01/24/2022 at 1000   venlafaxine XR (EFFEXOR-XR) 75 MG 24 hr capsule Take 75 mg by mouth daily with breakfast.    01/24/2022 at 1000   cetirizine (ZYRTEC) 10 MG tablet Take 10 mg by mouth daily. (Patient not taking: Reported on 01/25/2022)   Not Taking   gabapentin (NEURONTIN) 100 MG capsule Take 100 mg by mouth 2 (two) times daily.   prn at prn    Social History   Socioeconomic History   Marital status: Widowed    Spouse name: Not on file   Number of children: 5   Years of education: Not on file   Highest education level: Not on file  Occupational History   Not on file  Tobacco Use   Smoking status: Never   Smokeless tobacco: Never  Vaping Use   Vaping Use: Never used  Substance and Sexual Activity   Alcohol use: Yes    Alcohol/week: 3.0 standard drinks    Types: 3 Shots of liquor per week    Comment: 3 x week    Drug use: Never   Sexual activity: Not Currently  Other Topics Concern   Not on file  Social History Narrative   Not on file   Social Determinants of Health   Financial Resource Strain: Not on file  Food Insecurity: Not on file  Transportation Needs: Not on file  Physical Activity: Not on file  Stress: Not on file  Social Connections: Not on file  Intimate Partner Violence: Not on file    Family History  Problem Relation Age of Onset   Hypertension Mother    Heart disease Father    CAD Father    Heart disease Sister    Diabetes Sister    Diabetes Paternal Uncle     Review of systems complete and found to be negative unless listed above   PHYSICAL EXAM General: Elderly appearing Caucasian female, well nourished. Sitting upright  in icu bed with son and daughter at bedside  HEENT:  Normocephalic and atraumatic. Neck:  No JVD.  Lungs: normal work of breathing on room air. Clear to auscultation bilaterally.  Heart: Tachy irregularly irregular rate and rhythm. Normal S1 and S2. No murmurs.  Radial & DP pulses 2+ bilaterally. Abdomen: Soft, nontender but quite distended.  Msk:  Normal strength and tone for age. Extremities: Warm and well perfused. Left upper arm with large ecchymosis. No clubbing, cyanosis.  No lower extremity edema.  Neuro: Alert and oriented X 3. Psych:  Answers questions appropriately.   Labs:   Lab Results  Component Value Date   WBC 17.3 (H) 01/27/2022   HGB 11.6 (L) 01/27/2022   HCT 35.1 (L) 01/27/2022   MCV 87.8 01/27/2022   PLT 228 01/27/2022    Recent Labs  Lab 01/25/22 0733 01/26/22 0800 01/29/22 0452  NA 138   < > 140  K 3.2*   < > 4.5  CL 98   < > 107  CO2 27   < > 25  BUN 21   < > 23  CREATININE 1.41*   < > 0.83  CALCIUM 8.9   < > 8.9  PROT 7.8  --   --   BILITOT 0.9  --   --   ALKPHOS 82  --   --   ALT 20  --   --   AST 37  --   --   GLUCOSE 256*   < > 240*   < > = values in this interval not displayed.    Lab Results  Component Value Date   CKTOTAL 370 (H) 01/25/2022     Lab Results  Component Value Date   CHOL 195 01/11/2022   CHOL 166 05/10/2020   CHOL 103 06/17/2018   Lab Results  Component Value Date   HDL 45 01/11/2022   HDL 51 05/10/2020   HDL 37 (L) 06/17/2018   Lab Results  Component Value Date   LDLCALC 102 (H) 01/11/2022   LDLCALC 98 05/10/2020   LDLCALC 45 06/17/2018   Lab Results  Component Value Date   TRIG 281 (H) 01/11/2022   TRIG 84 05/10/2020   TRIG 107 06/17/2018   Lab Results  Component Value Date   CHOLHDL 4.3 01/11/2022   CHOLHDL 3.3 05/10/2020   CHOLHDL 2.8 06/17/2018   No results found for: LDLDIRECT    Radiology: DG Shoulder Right  Result Date: 01/26/2022 CLINICAL DATA:  Right shoulder pain since fall two days  ago. EXAM: RIGHT SHOULDER - 2+ VIEW COMPARISON:  Right shoulder x-rays from yesterday. FINDINGS: No acute fracture or dislocation. Unchanged mild acromioclavicular osteoarthritis. Osteopenia. Soft tissues are unremarkable. IMPRESSION: 1. No acute osseous abnormality. Electronically Signed   By: Titus Dubin M.D.   On: 01/26/2022 15:39   DG Shoulder Right  Result Date: 01/25/2022 CLINICAL DATA:  Fall with right shoulder pain. EXAM: RIGHT SHOULDER - 2+ VIEW COMPARISON:  None. FINDINGS: There is no evidence of fracture or dislocation. There is no evidence of arthropathy or other focal bone abnormality. Soft tissues are unremarkable. IMPRESSION: Negative. Electronically Signed   By: Misty Stanley M.D.   On: 01/25/2022 08:34   CT HEAD WO CONTRAST (5MM)  Result Date: 01/25/2022 CLINICAL DATA:  Trauma EXAM: CT HEAD WITHOUT CONTRAST TECHNIQUE: Contiguous axial images were obtained from the base of the skull through the vertex without intravenous contrast. RADIATION DOSE REDUCTION: This exam was performed according to the departmental dose-optimization program which includes automated exposure control, adjustment of the mA and/or kV according to patient size and/or use of iterative reconstruction technique. COMPARISON:  None. FINDINGS: Brain: Chronic white matter ischemic change. No evidence of acute infarction, hemorrhage, hydrocephalus, extra-axial collection or mass lesion/mass effect. Vascular: No hyperdense vessel or unexpected calcification. Skull: Normal. Negative for fracture or focal lesion. Sinuses/Orbits: No  acute finding. Other: None. IMPRESSION: No acute intracranial abnormality. Electronically Signed   By: Yetta Glassman M.D.   On: 01/25/2022 08:12   DG Chest Portable 1 View  Result Date: 01/25/2022 CLINICAL DATA:  SOB EXAM: PORTABLE CHEST 1 VIEW COMPARISON:  Earlier same day FINDINGS: Stable appearance of the cardiomediastinal silhouette. No pleural effusion. No pneumothorax. Diffuse increase in  prominent interstitial opacities. Osseous structures are unremarkable. IMPRESSION: Diffuse increase in interstitial opacities compared to radiograph earlier same day, most suggestive of pulmonary edema. No large pleural effusion. Electronically Signed   By: Albin Felling M.D.   On: 01/25/2022 13:04   DG Chest Port 1 View  Result Date: 01/25/2022 CLINICAL DATA:  Concern for sepsis EXAM: PORTABLE CHEST 1 VIEW COMPARISON:  Chest x-ray dated October 31, 2021 FINDINGS: Cardiac and mediastinal contours are within normal limits. Mild bilateral interstitial opacities. No focal consolidation. No large pleural effusion or pneumothorax. IMPRESSION: Mild bilateral interstitial opacities which are similar to prior and possibly due to pulmonary edema. No focal consolidation to suggest pneumonia. Electronically Signed   By: Yetta Glassman M.D.   On: 01/25/2022 08:56   DG Humerus Right  Result Date: 01/25/2022 CLINICAL DATA:  Fall.  Pain. EXAM: RIGHT HUMERUS - 2+ VIEW COMPARISON:  None. FINDINGS: There is no evidence of fracture or other focal bone lesions. Soft tissues are unremarkable. IMPRESSION: Negative. Electronically Signed   By: Misty Stanley M.D.   On: 01/25/2022 08:34    ECHO 10/31/21   1. Left ventricular ejection fraction, by estimation, is 45 to 50%. The  left ventricle has mildly decreased function. The left ventricle has no  regional wall motion abnormalities. Left ventricular diastolic parameters  were normal.   2. Right ventricular systolic function is normal. The right ventricular  size is normal.   3. The mitral valve is normal in structure. Mild mitral valve  regurgitation. No evidence of mitral stenosis.   4. The aortic valve is normal in structure. Aortic valve regurgitation is  not visualized. No aortic stenosis is present.   5. The inferior vena cava is normal in size with greater than 50%  respiratory variability, suggesting right atrial pressure of 3 mmHg.   TELEMETRY reviewed by  me: AF vs sinus tach rate   EKG reviewed by me: Initial EKG at 0721 showed A. fib with a rate of 99, repeat EKG after receiving all the fluids showed A. fib with RVR at 145 at 1300.   ASSESSMENT AND PLAN:  The patient is a 5yoF with PMH significant for paroxysmal atrial fibrillation on amiodarone and Eliquis, CAD with history of NSTEMI 10/12/21 with LHC showing 99% proximal RCA stenosis and was treated medically, HFrEF (10/2021 LVEF 40-45 %), HTN, hyperlipidemia, type 2 diabetes who presented to [facility] ED the morning of 01/25/22 after a fall, injuring her right arm. She spent the night on the floor of her apartment unable to get up. She is admitted with sepsis from E coli bacteremia related to a UTI. Cardiology is consulted for assistance with her AF with RVR.   #paroxysmal AF with RVR  #sepsis 2/2 ecoli bacteremia #elevated troponin 2/2 demand ischemia The patient presented with a few day history of fatigue, weakness and passed out and fell last. She was discovered to have sepsis from pyelo with E coli bacteremia. She had AF with RVR and flash pulmonary edema in this setting and improved with diuresis and treatment of infection.  - Continue Eliquis - Continue PO amio 400 BID for  1 more day, then 200 mg daily.  - Increase Metoprolol 50 mg BID -  PO lasix 40 mg today; can resume torsemide 20 mg daily at DC -home meds: amiodarone 200mg  daily, 50mg  metop succinate (increased to metop tartrate 50 mg BID) and eliquis 5 BID for stroke risk reduction -chadsvasc ~6   #CAD with Hx NSTEMI 09/2021, medically managed #HTN #hyperlipidemia -defer heparin gtt -continue Eliquis 5 mg twice daily as above -continue plavix, pravastatin 20mg . LDL 102 and trigs 281. Consider escalation of statin therapy as outpatient - Restart lisinopril 5 mg today. -holding home imdur 30 mg. Can be resumed in follow up. -recommend continuing cardiac rehab at discharge.   Patient is safe for discharge from a cardiac perspective  today. Please have her follow up with me in 2 weeks after DC  Signed: Andrez Grime , MD 01/29/2022, 7:20 AM [facility]

## 2022-01-29 NOTE — Progress Notes (Signed)
Patient ID: Alison Richardson, female   DOB: 01-13-1933, 88 y.o.   MRN: [id number]  Subjective: The patient still notes some pain in her right shoulder with activities at or above shoulder level as well as when abducting away from her body, but feels that her function is improved as compared to prior to her injection.  She has no new complaints regarding the shoulder or upper extremity.   Objective: Vital signs in last 24 hours: Temp:  [97.6 F (36.4 C)-97.9 F (36.6 C)] 97.8 F (36.6 C) (02/06 0755) Pulse Rate:  [89-109] 89 (02/06 0755) Resp:  [16-20] 20 (02/06 0755) BP: (127-154)/(77-98) 138/88 (02/06 0755) SpO2:  [96 %-100 %] 97 % (02/06 0755)  Intake/Output from previous day: 02/05 0701 - 02/06 0700 In: 1240 [P.O.:1140] Out: 2850 [Urine:2850] Intake/Output this shift: No intake/output data recorded.  Recent Labs    01/27/22 0446  HGB 11.6*   Recent Labs    01/27/22 0446  WBC 17.3*  RBC 4.00  HCT 35.1*  PLT 228   Recent Labs    01/28/22 0506 01/29/22 0452  NA 138 140  K 4.2 4.5  CL 106 107  CO2 24 25  BUN 15 23  CREATININE 0.83 0.83  GLUCOSE 243* 240*  CALCIUM 8.7* 8.9   No results for input(s): LABPT, INR in the last 72 hours.  Physical Exam: Orthopedic examination remains limited to the right shoulder and upper extremity.  Skin inspection of the right shoulder again is unremarkable.  No swelling, erythema, ecchymosis, abrasions, or other skin abnormalities are identified.  She experiences mild tenderness to palpation over the anterolateral aspect of the shoulder.  Actively, she is now able to raise her arm above her head, and is able to to reach the back of her neck and to the top of her head with only some pain in the shoulder, especially as she brings her arm back down through 70 to 90 degrees of abduction/forward flexion.  She again is neurovascularly intact to the right upper extremity and hand.  Assessment: Right shoulder pain status post fall with possible  rotator cuff tear, improved symptomatically following steroid injection.  Plan: At this point, the patient does not wish to consider surgical intervention so we will not obtain an MRI scan at this time.  The patient may continue to be mobilized with physical therapy.    She states that she is being discharged to a skilled nursing facility later today.  Therefore, I will sign off at this time.  Please arrange for her to follow-up in my office in 3 to 4 weeks for re-evaluation of her right shoulder.  She may be started in physical therapy for her right shoulder at her skilled nursing facility, working on progressive range of motion and strength exercises.  Thank you for asking me to participate in the care of this most delightful woman.  I will sign off at this time.  Please reconsult me if you need further orthopedic input during this hospitalization.   Marshall Cork Pennie Vanblarcom 01/29/2022, 8:15 AM

## 2022-01-30 DIAGNOSIS — W19XXXA Unspecified fall, initial encounter: Secondary | ICD-10-CM | POA: Diagnosis not present

## 2022-01-30 DIAGNOSIS — R112 Nausea with vomiting, unspecified: Secondary | ICD-10-CM | POA: Diagnosis not present

## 2022-01-30 LAB — BASIC METABOLIC PANEL
Anion gap: 7 (ref 5–15)
BUN: 23 mg/dL (ref 8–23)
CO2: 25 mmol/L (ref 22–32)
Calcium: 8.8 mg/dL — ABNORMAL LOW (ref 8.9–10.3)
Chloride: 108 mmol/L (ref 98–111)
Creatinine, Ser: 0.66 mg/dL (ref 0.44–1.00)
GFR, Estimated: >60 mL/min (ref >60)
Glucose, Bld: 203 mg/dL — ABNORMAL HIGH (ref 70–99)
Potassium: 4.1 mmol/L (ref 3.5–5.1)
Sodium: 140 mmol/L (ref 135–145)

## 2022-01-30 LAB — GLUCOSE, CAPILLARY: Glucose-Capillary: 174 mg/dL — ABNORMAL HIGH (ref 70–99)

## 2022-01-30 MED ORDER — CEPHALEXIN 500 MG PO CAPS: 500.0000 mg | ORAL_CAPSULE | Freq: Four times a day (QID) | ORAL | 0 refills | Status: DC

## 2022-01-30 NOTE — Discharge Summary (Signed)
Alison Richardson ZOX:[id number] DOB: [birth year] DOA: 01/25/2022  PCP: Jon Billings, NP  Admit date: 01/25/2022 Discharge date: 01/30/2022  Time spent: 35 minutes  Recommendations for Outpatient Follow-up:  Cardiology f/u 2 weeks     Discharge Diagnoses:  Principal Problem:   Fall Active Problems:   Paroxysmal atrial fibrillation with rapid ventricular response ([place])   Vitamin B12 deficiency   Type 2 diabetes mellitus with diabetic neuropathy ([place])   Benign essential hypertension   Obstructive sleep apnea   Acute cystitis   Discharge Condition: improved  Diet recommendation: heart healthy  Filed Weights   01/25/22 0722  Weight: 80.7 kg    History of present illness:  Alison Richardson is a 88 y.o. female with medical history significant for a fib, dm, htn, hypothyroid, PAD, combined systolic/diastolic chf, cad/nstemi, who presents with the above.   Reports about a week of increased urinary frequency/urgency. No dysuria. No fevers. Yesterday felt nauseaus, went to bathroom and vomited. Fell to floor, injuring right arm and shoulder. Was unable to get up and call for help and so spent the night on the floor. No chest pain or dyspnea. No fevers. No diarrhea. No other vomiting.    In the ED was found to be leukycotitic with aki of 1.4 and elevated lactate. Was treated for presumed sepsis with antibiotics and IV fluids.   Shortly prior to my evaluation the patient developed acute shortness of breath and RVR. She was placed on bipap and given 80 IV lasix. CXR shows increase in interstitial opacities compared with cxr on presentation, suggestive of pulmonary edema.   Hospital Course:  # Acute cystitis # Sepsis # E. Coli bacteremia One week urinary frequency, one episode vomiting at home, urinalysis suggestive of infection. The likely inciting event for her other hospital problems. Sepsis by leukocytosis, tachycardia, lactic acidosis. S/p fluid resuscitation. GNRs growing in 4/4. Abx  started after UA was obtained so though urine culture is neg suspect that to be the source. Symptomatically improved. Cultures pan-sensitive - stopped ceftriaxone (2/2-2/4), discussed w/ pharmacy, started keflex 500 q6 on 2/4, plan for 7 day course   # A-fib with rvr # Flash pulmonary edema # Acute hypoxic respiratory failure Treated for sepsis with liberal fluids in the ED. Developed flash pulmonary edema and a fib with rvr. Treated with bipap and lasix. Cardiology now following. Weaned off bipap and now off o2. Amio gtt discontinued  - resume home meds - cardiology f/u 2 wks   # Demand ischemia Trop 811>914, no chest pain, likely demand from above processes   # Hypokalemia Resolved w/ supplementation - monitor   # T2DM Here mildly hyperglycemic   # Fall # Shoulder injury With right shoulder/arm pain. X-ray neg for fracture. Normal rom hip without pain. Unable to abduct arm, in pain, family very concerned. Ortho consulted 2/4, thinks possible rotator cuff injury, CSI provided. Pain improving - ortho f/u prn  # Acute kidney injury 2/2 uti, immobility. Resolved w/ fluids - monitor    Procedures: Corticosteroid injection   Consultations: Ortho, cardiology  Discharge Exam: Vitals:   01/30/22 0803 01/30/22 0832  BP: (!) 145/97 (!) 147/79  Pulse: 98 82  Resp: 18 16  Temp: 98 F (36.7 C) 98 F (36.7 C)  SpO2: 95% 96%    General exam: Appears calm and comfortable  Respiratory system: Clear to auscultation save for rales at bases Cardiovascular system: S1 & S2 heard, RRR. No JVD, murmurs, rubs, gallops or clicks. No pedal edema. Gastrointestinal system: Abdomen  is nondistended, soft and nontender. No organomegaly or masses felt. Normal bowel sounds heard. Central nervous system: Alert. No focal neurological deficits. MSK: unable to abduct right shoulder above 90 Skin: No rashes, lesions or ulcers Psychiatry: mildly confused, calm  Discharge Instructions   Discharge  Instructions     Diet - low sodium heart healthy   Complete by: As directed    Increase activity slowly   Complete by: As directed       Allergies as of 01/30/2022       Reactions   Pantoprazole Nausea And Vomiting   Metformin And Related Diarrhea        Medication List     TAKE these medications    amiodarone 200 MG tablet Commonly known as: PACERONE Take 200 mg by mouth daily.   cephALEXin 500 MG capsule Commonly known as: KEFLEX Take 1 capsule (500 mg total) by mouth 4 (four) times daily.   cetirizine 10 MG tablet Commonly known as: ZYRTEC Take 10 mg by mouth daily.   clopidogrel 75 MG tablet Commonly known as: PLAVIX Take 75 mg by mouth daily.   donepezil 10 MG tablet Commonly known as: ARICEPT Take 10 mg by mouth at bedtime.   Eliquis 5 MG Tabs tablet Generic drug: apixaban Take 5 mg by mouth 2 (two) times daily.   gabapentin 100 MG capsule Commonly known as: NEURONTIN Take 100 mg by mouth 2 (two) times daily.   glipiZIDE 2.5 MG 24 hr tablet Commonly known as: GLUCOTROL XL Take 2.5 mg by mouth daily.   isosorbide mononitrate 30 MG 24 hr tablet Commonly known as: IMDUR Take 30 mg by mouth daily.   levothyroxine 88 MCG tablet Commonly known as: SYNTHROID Take 88 mcg by mouth daily.   lisinopril 5 MG tablet Commonly known as: ZESTRIL Take 5 mg by mouth daily.   metoprolol succinate 100 MG 24 hr tablet Commonly known as: TOPROL-XL TAKE ONE TABLET (100 MG) BY MOUTH EVERY DAY (TAKE WITH OR IMMEDIATELY AFTER A MEAL)   montelukast 10 MG tablet Commonly known as: SINGULAIR Take 1 tablet (10 mg total) by mouth at bedtime.   pravastatin 20 MG tablet Commonly known as: PRAVACHOL Take 20 mg by mouth at bedtime.   torsemide 20 MG tablet Commonly known as: DEMADEX Take 1 tablet (20 mg total) by mouth daily.   venlafaxine XR 75 MG 24 hr capsule Commonly known as: EFFEXOR-XR Take 75 mg by mouth daily with breakfast.       Allergies   Allergen Reactions   Pantoprazole Nausea And Vomiting   Metformin And Related Diarrhea    Follow-up Information     Andrez Grime, MD. Go in 1 week(s).   Specialty: Cardiology Why: Please follow up 1-2 weeks after discharge Contact information: [place] [address] [address], NP Follow up.   Specialty: Nurse Practitioner Contact information: [place] [address] [phone]         Poggi, Marshall Cork, MD Follow up.   Specialty: Orthopedic Surgery Why: As needed Contact information: [place] [place] [address] [phone]                  The results of significant diagnostics from this hospitalization (including imaging, microbiology, ancillary and laboratory) are listed below for reference.    Significant Diagnostic Studies: DG Shoulder Right  Result Date: 01/26/2022 CLINICAL DATA:  Right shoulder pain since fall two days ago. EXAM: RIGHT SHOULDER - 2+ VIEW COMPARISON:  Right shoulder x-rays from yesterday. FINDINGS: No acute fracture or dislocation. Unchanged mild acromioclavicular osteoarthritis. Osteopenia. Soft tissues are unremarkable. IMPRESSION: 1. No acute osseous abnormality. Electronically Signed   By: Titus Dubin M.D.   On: 01/26/2022 15:39   DG Shoulder Right  Result Date: 01/25/2022 CLINICAL DATA:  Fall with right shoulder pain. EXAM: RIGHT SHOULDER - 2+ VIEW COMPARISON:  None. FINDINGS: There is no evidence of fracture or dislocation. There is no evidence of arthropathy or other focal bone abnormality. Soft tissues are unremarkable. IMPRESSION: Negative. Electronically Signed   By: Misty Stanley M.D.   On: 01/25/2022 08:34   CT HEAD WO CONTRAST (5MM)  Result Date: 01/25/2022 CLINICAL DATA:  Trauma EXAM: CT HEAD WITHOUT CONTRAST TECHNIQUE: Contiguous axial images were obtained from the base of the skull through the vertex without  intravenous contrast. RADIATION DOSE REDUCTION: This exam was performed according to the departmental dose-optimization program which includes automated exposure control, adjustment of the mA and/or kV according to patient size and/or use of iterative reconstruction technique. COMPARISON:  None. FINDINGS: Brain: Chronic white matter ischemic change. No evidence of acute infarction, hemorrhage, hydrocephalus, extra-axial collection or mass lesion/mass effect. Vascular: No hyperdense vessel or unexpected calcification. Skull: Normal. Negative for fracture or focal lesion. Sinuses/Orbits: No acute finding. Other: None. IMPRESSION: No acute intracranial abnormality. Electronically Signed   By: Yetta Glassman M.D.   On: 01/25/2022 08:12   DG Chest Portable 1 View  Result Date: 01/25/2022 CLINICAL DATA:  SOB EXAM: PORTABLE CHEST 1 VIEW COMPARISON:  Earlier same day FINDINGS: Stable appearance of the cardiomediastinal silhouette. No pleural effusion. No pneumothorax. Diffuse increase in prominent interstitial opacities. Osseous structures are unremarkable. IMPRESSION: Diffuse increase in interstitial opacities compared to radiograph earlier same day, most suggestive of pulmonary edema. No large pleural effusion. Electronically Signed   By: Albin Felling M.D.   On: 01/25/2022 13:04   DG Chest Port 1 View  Result Date: 01/25/2022 CLINICAL DATA:  Concern for sepsis EXAM: PORTABLE CHEST 1 VIEW COMPARISON:  Chest x-ray dated October 31, 2021 FINDINGS: Cardiac and mediastinal contours are within normal limits. Mild bilateral interstitial opacities. No focal consolidation. No large pleural effusion or pneumothorax. IMPRESSION: Mild bilateral interstitial opacities which are similar to prior and possibly due to pulmonary edema. No focal consolidation to suggest pneumonia. Electronically Signed   By: Yetta Glassman M.D.   On: 01/25/2022 08:56   DG Humerus Right  Result Date: 01/25/2022 CLINICAL DATA:  Fall.  Pain.  EXAM: RIGHT HUMERUS - 2+ VIEW COMPARISON:  None. FINDINGS: There is no evidence of fracture or other focal bone lesions. Soft tissues are unremarkable. IMPRESSION: Negative. Electronically Signed   By: Misty Stanley M.D.   On: 01/25/2022 08:34    Microbiology: Recent Results (from the past 240 hour(s))  Resp Panel by RT-PCR (Flu A&B, Covid) Nasopharyngeal Swab     Status: None   Collection Time: 01/25/22  7:33 AM   Specimen: Nasopharyngeal Swab; Nasopharyngeal(NP) swabs in vial transport medium  Result Value Ref Range Status   SARS Coronavirus 2 by RT PCR NEGATIVE NEGATIVE Final    Comment: (NOTE) SARS-CoV-2 target nucleic acids are NOT DETECTED.  The SARS-CoV-2 RNA is generally detectable in upper respiratory specimens during the acute phase of infection. The lowest concentration of SARS-CoV-2 viral copies this assay can detect is 138 copies/mL. A negative result does not preclude SARS-Cov-2 infection and  should not be used as the sole basis for treatment or other patient management decisions. A negative result may occur with  improper specimen collection/handling, submission of specimen other than nasopharyngeal swab, presence of viral mutation(s) within the areas targeted by this assay, and inadequate number of viral copies(<138 copies/mL). A negative result must be combined with clinical observations, patient history, and epidemiological information. The expected result is Negative.  Fact Sheet for Patients:  EntrepreneurPulse.com.au  Fact Sheet for Healthcare Providers:  IncredibleEmployment.be  This test is no t yet approved or cleared by the [place] FDA and  has been authorized for detection and/or diagnosis of SARS-CoV-2 by FDA under an Emergency Use Authorization (EUA). This EUA will remain  in effect (meaning this test can be used) for the duration of the COVID-19 declaration under Section 564(b)(1) of the Act, 21 U.S.C.section  360bbb-3(b)(1), unless the authorization is terminated  or revoked sooner.       Influenza A by PCR NEGATIVE NEGATIVE Final   Influenza B by PCR NEGATIVE NEGATIVE Final    Comment: (NOTE) The Xpert Xpress SARS-CoV-2/FLU/RSV plus assay is intended as an aid in the diagnosis of influenza from Nasopharyngeal swab specimens and should not be used as a sole basis for treatment. Nasal washings and aspirates are unacceptable for Xpert Xpress SARS-CoV-2/FLU/RSV testing.  Fact Sheet for Patients: EntrepreneurPulse.com.au  Fact Sheet for Healthcare Providers: IncredibleEmployment.be  This test is not yet approved or cleared by the [place] FDA and has been authorized for detection and/or diagnosis of SARS-CoV-2 by FDA under an Emergency Use Authorization (EUA). This EUA will remain in effect (meaning this test can be used) for the duration of the COVID-19 declaration under Section 564(b)(1) of the Act, 21 U.S.C. section 360bbb-3(b)(1), unless the authorization is terminated or revoked.  Performed at [facility], [address] [place], [place] [address]   Blood Culture (routine x 2)     Status: Abnormal   Collection Time: 01/25/22  8:38 AM   Specimen: BLOOD  Result Value Ref Range Status   Specimen Description   Final    BLOOD BLOOD RIGHT FOREARM Performed at [facility], [address] [place], [place] [address]    Special Requests   Final    BOTTLES DRAWN AEROBIC AND ANAEROBIC Blood Culture results may not be optimal due to an inadequate volume of blood received in culture bottles Performed at [facility], [address] [place], [place] [address]    Culture  Setup Time   Final    GRAM NEGATIVE RODS IN BOTH AEROBIC AND ANAEROBIC BOTTLES Organism ID to follow CRITICAL RESULT CALLED TO, READ BACK BY AND VERIFIED WITH: RODNEY GRUBB 01/25/22 2040 MU    Culture ESCHERICHIA COLI (A)  Final   Report Status  01/27/2022 FINAL  Final   Organism ID, Bacteria ESCHERICHIA COLI  Final      Susceptibility   Escherichia coli - MIC*    AMPICILLIN 4 SENSITIVE Sensitive     CEFAZOLIN <=4 SENSITIVE Sensitive     CEFEPIME <=0.12 SENSITIVE Sensitive     CEFTAZIDIME <=1 SENSITIVE Sensitive     CEFTRIAXONE <=0.25 SENSITIVE Sensitive     CIPROFLOXACIN <=0.25 SENSITIVE Sensitive     GENTAMICIN <=1 SENSITIVE Sensitive     IMIPENEM <=0.25 SENSITIVE Sensitive     TRIMETH/SULFA <=20 SENSITIVE Sensitive     AMPICILLIN/SULBACTAM <=2 SENSITIVE Sensitive     PIP/TAZO <=4 SENSITIVE Sensitive     * ESCHERICHIA COLI  Blood Culture  ID Panel (Reflexed)     Status: Abnormal   Collection Time: 01/25/22  8:38 AM  Result Value Ref Range Status   Enterococcus faecalis NOT DETECTED NOT DETECTED Final   Enterococcus Faecium NOT DETECTED NOT DETECTED Final   Listeria monocytogenes NOT DETECTED NOT DETECTED Final   Staphylococcus species NOT DETECTED NOT DETECTED Final   Staphylococcus aureus (BCID) NOT DETECTED NOT DETECTED Final   Staphylococcus epidermidis NOT DETECTED NOT DETECTED Final   Staphylococcus lugdunensis NOT DETECTED NOT DETECTED Final   Streptococcus species NOT DETECTED NOT DETECTED Final   Streptococcus agalactiae NOT DETECTED NOT DETECTED Final   Streptococcus pneumoniae NOT DETECTED NOT DETECTED Final   Streptococcus pyogenes NOT DETECTED NOT DETECTED Final   A.calcoaceticus-baumannii NOT DETECTED NOT DETECTED Final   Bacteroides fragilis NOT DETECTED NOT DETECTED Final   Enterobacterales DETECTED (A) NOT DETECTED Final    Comment: Enterobacterales represent a large order of gram negative bacteria, not a single organism. CRITICAL RESULT CALLED TO, READ BACK BY AND VERIFIED WITH: RODNEY GRUBB 01/25/22 2040 MU    Enterobacter cloacae complex NOT DETECTED NOT DETECTED Final   Escherichia coli DETECTED (A) NOT DETECTED Final    Comment: CRITICAL RESULT CALLED TO, READ BACK BY AND VERIFIED WITH: RODNEY  GRUBB 01/25/22 2040 MU    Klebsiella aerogenes NOT DETECTED NOT DETECTED Final   Klebsiella oxytoca NOT DETECTED NOT DETECTED Final   Klebsiella pneumoniae NOT DETECTED NOT DETECTED Final   Proteus species NOT DETECTED NOT DETECTED Final   Salmonella species NOT DETECTED NOT DETECTED Final   Serratia marcescens NOT DETECTED NOT DETECTED Final   Haemophilus influenzae NOT DETECTED NOT DETECTED Final   Neisseria meningitidis NOT DETECTED NOT DETECTED Final   Pseudomonas aeruginosa NOT DETECTED NOT DETECTED Final   Stenotrophomonas maltophilia NOT DETECTED NOT DETECTED Final   Candida albicans NOT DETECTED NOT DETECTED Final   Candida auris NOT DETECTED NOT DETECTED Final   Candida glabrata NOT DETECTED NOT DETECTED Final   Candida krusei NOT DETECTED NOT DETECTED Final   Candida parapsilosis NOT DETECTED NOT DETECTED Final   Candida tropicalis NOT DETECTED NOT DETECTED Final   Cryptococcus neoformans/gattii NOT DETECTED NOT DETECTED Final   CTX-M ESBL NOT DETECTED NOT DETECTED Final   Carbapenem resistance IMP NOT DETECTED NOT DETECTED Final   Carbapenem resistance KPC NOT DETECTED NOT DETECTED Final   Carbapenem resistance NDM NOT DETECTED NOT DETECTED Final   Carbapenem resist OXA 48 LIKE NOT DETECTED NOT DETECTED Final   Carbapenem resistance VIM NOT DETECTED NOT DETECTED Final    Comment: Performed at [facility], [address] [place], [place] [address]  Blood Culture (routine x 2)     Status: Abnormal   Collection Time: 01/25/22  8:49 AM   Specimen: BLOOD  Result Value Ref Range Status   Specimen Description   Final    BLOOD BLOOD LEFT FOREARM Performed at [facility], [address] [place], [place] [address]    Special Requests   Final    BOTTLES DRAWN AEROBIC AND ANAEROBIC Blood Culture results may not be optimal due to an inadequate volume of blood received in culture bottles Performed at [facility], [address] [place], [place]  [address]    Culture  Setup Time   Final    GRAM NEGATIVE RODS IN BOTH AEROBIC AND ANAEROBIC BOTTLES CRITICAL VALUE NOTED.  VALUE IS CONSISTENT WITH PREVIOUSLY REPORTED AND CALLED VALUE.    Culture (A)  Final    ESCHERICHIA  COLI SUSCEPTIBILITIES PERFORMED ON PREVIOUS CULTURE WITHIN THE LAST 5 DAYS. Performed at [facility], [place] [address] [place], [place] [address]    Report Status 01/27/2022 FINAL  Final  Urine Culture     Status: None   Collection Time: 01/25/22 10:43 AM   Specimen: In/Out Cath Urine  Result Value Ref Range Status   Specimen Description   Final    IN/OUT CATH URINE Performed at [facility], [address], [place], [place] [address]    Special Requests   Final    NONE Performed at [facility], [address] [place], [place] [address]    Culture   Final    NO GROWTH Performed at [facility], [place] [address], [place], [place] [address]    Report Status 01/26/2022 FINAL  Final  MRSA Next Gen by PCR, Nasal     Status: None   Collection Time: 01/26/22  6:45 AM   Specimen: Nasal Mucosa; Nasal Swab  Result Value Ref Range Status   MRSA by PCR Next Gen NOT DETECTED NOT DETECTED Final    Comment: (NOTE) The GeneXpert MRSA Assay (FDA approved for NASAL specimens only), is one component of a comprehensive MRSA colonization surveillance program. It is not intended to diagnose MRSA infection nor to guide or monitor treatment for MRSA infections. Test performance is not FDA approved in patients less than 51 years old. Performed at [facility] [address] [place], [place] [address]   Resp Panel by RT-PCR (Flu A&B, Covid) Nasopharyngeal Swab     Status: None   Collection Time: 01/29/22  3:13 PM   Specimen: Nasopharyngeal Swab; Nasopharyngeal(NP) swabs in vial transport medium  Result Value Ref Range Status   SARS Coronavirus 2 by RT PCR NEGATIVE NEGATIVE Final    Comment: (NOTE) SARS-CoV-2 target nucleic acids are  NOT DETECTED.  The SARS-CoV-2 RNA is generally detectable in upper respiratory specimens during the acute phase of infection. The lowest concentration of SARS-CoV-2 viral copies this assay can detect is 138 copies/mL. A negative result does not preclude SARS-Cov-2 infection and should not be used as the sole basis for treatment or other patient management decisions. A negative result may occur with  improper specimen collection/handling, submission of specimen other than nasopharyngeal swab, presence of viral mutation(s) within the areas targeted by this assay, and inadequate number of viral copies(<138 copies/mL). A negative result must be combined with clinical observations, patient history, and epidemiological information. The expected result is Negative.  Fact Sheet for Patients:  EntrepreneurPulse.com.au  Fact Sheet for Healthcare Providers:  IncredibleEmployment.be  This test is no t yet approved or cleared by the [place] FDA and  has been authorized for detection and/or diagnosis of SARS-CoV-2 by FDA under an Emergency Use Authorization (EUA). This EUA will remain  in effect (meaning this test can be used) for the duration of the COVID-19 declaration under Section 564(b)(1) of the Act, 21 U.S.C.section 360bbb-3(b)(1), unless the authorization is terminated  or revoked sooner.       Influenza A by PCR NEGATIVE NEGATIVE Final   Influenza B by PCR NEGATIVE NEGATIVE Final    Comment: (NOTE) The Xpert Xpress SARS-CoV-2/FLU/RSV plus assay is intended as an aid in the diagnosis of influenza from Nasopharyngeal swab specimens and should not be used as a sole basis for treatment. Nasal washings and aspirates are unacceptable for Xpert Xpress SARS-CoV-2/FLU/RSV testing.  Fact Sheet for Patients: EntrepreneurPulse.com.au  Fact Sheet for Healthcare Providers:  IncredibleEmployment.be  This test is not  yet approved or cleared by the [place] and has been authorized for detection and/or diagnosis of SARS-CoV-2 by FDA under an Emergency Use Authorization (EUA). This EUA will remain in effect (meaning this test can be used) for the duration of the COVID-19 declaration under Section 564(b)(1) of the Act, 21 U.S.C. section 360bbb-3(b)(1), unless the authorization is terminated or revoked.  Performed at [facility], [address] [place], [place] [address]      Labs: Basic Metabolic Panel: Recent Labs  Lab 01/25/22 [phone] 01/26/22 0800 01/26/22 4825 01/27/22 0446 01/28/22 0506 01/29/22 0452 01/30/22 0517  NA  --  139  --  138 138 140 140  K  --  3.4*  --  3.7 4.2 4.5 4.1  CL  --  108  --  106 106 107 108  CO2  --  22  --  25 24 25 25   GLUCOSE  --  127*  --  162* 243* 240* 203*  BUN  --  23  --  18 15 23 23   CREATININE  --  1.10*  --  0.74 0.83 0.83 0.66  CALCIUM  --  7.6*  --  8.3* 8.7* 8.9 8.8*  MG 1.7  --  1.6* 2.1 2.2  --   --    Liver Function Tests: Recent Labs  Lab 01/25/22 0733  AST 37  ALT 20  ALKPHOS 82  BILITOT 0.9  PROT 7.8  ALBUMIN 3.9   No results for input(s): LIPASE, AMYLASE in the last 168 hours. No results for input(s): AMMONIA in the last 168 hours. CBC: Recent Labs  Lab 01/25/22 0733 01/26/22 0800 01/27/22 0446  WBC 27.7* 23.5* 17.3*  HGB 13.7 10.8* 11.6*  HCT 41.7 33.1* 35.1*  MCV 88.3 90.7 87.8  PLT 277 206 228   Cardiac Enzymes: Recent Labs  Lab 01/25/22 0742  CKTOTAL 370*   BNP: BNP (last 3 results) Recent Labs    10/31/21 0941  BNP 314.3*    ProBNP (last 3 results) No results for input(s): PROBNP in the last 8760 hours.  CBG: Recent Labs  Lab 01/29/22 0757 01/29/22 1140 01/29/22 1559 01/29/22 2059 01/30/22 0805  GLUCAP 181* 368* 158* 180* 174*       Signed:  Desma Maxim MD.  [facility] 01/30/2022, 10:35 AM

## 2022-01-30 NOTE — Progress Notes (Signed)
Physical Therapy Treatment Patient Details Name: Alison Richardson MRN: [id number] DOB: Nov 28, 1933 Today's Date: 01/30/2022   History of Present Illness 88 y/o F admitted 01/25/22 after episode of N&V & fall to the floor, injuring RUE & shoulder, unable to get up so spent the night on the floor. Pt was tx for presumed sepsis & developed acute SOB & RVR & was initially placed on bipap. CXR shows increase in interstitial opacities compared with cxr on presentation, suggestive of pulmonary edema. RUE shoulder/arm x-ray negative for fx. PMH: a-fib, DM, HTN, hypothyroid, PAD, combined systolic/diastolic CHF, CAD, NSTEMI    PT Comments    Pt did well with mobility and overall showed improved confidence and safety with prolonged bout of ambulation.  She did well both with and w/o UE support with only slightly guarded cadence w/o UE support that improved with increased distance and encouragement.  Pt reports that she is feeling much better overall today, though somewhat concerned that she has not been as active while hospitalized as she normally is.  Her HR did rise to 130 during 500 ft of ambulation but she did not have excessive fatigue and showed ability to be able to safely get to/from dining area, etc for safe transition back to ILF.  She will benefit from HHPT as she is still not fully back to her baseline mobility/independent status.    Recommendations for follow up therapy are one component of a multi-disciplinary discharge planning process, led by the attending physician.  Recommendations may be updated based on patient status, additional functional criteria and insurance authorization.  Follow Up Recommendations  Home health PT     Assistance Recommended at Discharge Intermittent Supervision/Assistance  Patient can return home with the following Assistance with cooking/housework   Equipment Recommendations   (pt has rollator and cane)    Recommendations for Other Services       Precautions /  Restrictions Precautions Precautions: Fall Restrictions Weight Bearing Restrictions: No     Mobility  Bed Mobility Overal bed mobility: Modified Independent Bed Mobility: Supine to Sit     Supine to sit: Modified independent (Device/Increase time)     General bed mobility comments: Pt able to get herself to sitting EOB w/o hesitation or issue    Transfers Overall transfer level: Modified independent Equipment used: Rolling walker (2 wheels), None Transfers: Sit to/from Stand Sit to Stand: Supervision           General transfer comment: Pt able to rise to standing confidently and w/o issue, did not require RW to steady once upright.    Ambulation/Gait Ambulation/Gait assistance: Supervision Gait Distance (Feet): 500 Feet Assistive device: Rolling walker (2 wheels), 1 person hand held assist, None         General Gait Details: Pt able to ambulate with and w/o UE support, she reports feeling a little more steady with AD, but even w/o AD (>200 ft) she had no LOBs and was able to maintain relatively consistent cadence and speed w/o safety issues.  HR did slowly creep from 90s to ~130 with the effort, O2 remained in the mid 90s on room air w/o no SOB or excessive fatigue.   Stairs             Wheelchair Mobility    Modified Rankin (Stroke Patients Only)       Balance Overall balance assessment: Modified Independent Sitting-balance support: Feet supported, No upper extremity supported Sitting balance-Leahy Scale: Good     Standing balance support: No upper  extremity supported, During functional activity Standing balance-Leahy Scale: Good Standing balance comment: Pt able to stand and brush hair (using both UEs) and maintained balance well.  Biggest issue was R shoulder pain with elevation, no safety concerns                            Cognition Arousal/Alertness: Awake/alert Behavior During Therapy: WFL for tasks assessed/performed Overall  Cognitive Status: Within Functional Limits for tasks assessed                                          Exercises      General Comments        Pertinent Vitals/Pain Pain Assessment Pain Assessment: 0-10 Pain Score: 4  Pain Location: when attempting to move R shoulder (more so when lowering than when flexing shoulder)    Home Living                          Prior Function            PT Goals (current goals can now be found in the care plan section) Progress towards PT goals: Progressing toward goals    Frequency    Min 2X/week      PT Plan Current plan remains appropriate    Co-evaluation              AM-PAC PT "6 Clicks" Mobility   Outcome Measure  Help needed turning from your back to your side while in a flat bed without using bedrails?: None Help needed moving from lying on your back to sitting on the side of a flat bed without using bedrails?: None Help needed moving to and from a bed to a chair (including a wheelchair)?: None Help needed standing up from a chair using your arms (e.g., wheelchair or bedside chair)?: None Help needed to walk in hospital room?: None Help needed climbing 3-5 steps with a railing? : A Little 6 Click Score: 23    End of Session Equipment Utilized During Treatment: Gait belt Activity Tolerance: Patient tolerated treatment well Patient left: in chair;with call bell/phone within reach Nurse Communication: Mobility status PT Visit Diagnosis: Muscle weakness (generalized) (M62.81);Unsteadiness on feet (R26.81) Pain - Right/Left: Right Pain - part of body: Shoulder     Time: 6734-1937 PT Time Calculation (min) (ACUTE ONLY): 26 min  Charges:  $Gait Training: 8-22 mins $Therapeutic Activity: 8-22 mins                     Kreg Shropshire, DPT 01/30/2022, 10:06 AM

## 2022-01-30 NOTE — Progress Notes (Signed)
PIV removed. Discharge instructions completed. Patient verbalized understanding of medication regimen, follow up appointments and discharge instructions. Patient belongings gathered and packed to discharge.  

## 2022-01-30 NOTE — TOC Transition Note (Addendum)
Transition of Care [facility]) - CM/SW Discharge Note   Patient Details  Name: Alison Richardson MRN: [id number] Date of Birth: [birth year]  [facility] CM/SW Contact:  Alberteen Sam, LCSW Phone Number: 01/30/2022, 10:02 AM   Clinical Narrative:     Patient to discharge today back to [place] apartment.   Per PT patient no longer requires SNF level of care but they are recommending home health (following peer to peer insurance).   CSW spoke with Maudie Mercury at [facility], she reports they can have their therapy team at [facility] provide in home PT and OT to patient. Will just need orders for home health, MD made aware.   Patient reports she has walker at home, Ned Grace at [facility] reports she will have transport pick up patient at 11:30 am, RN made aware to have patient wheeled down to front medical mall entrance by then.   Patient and patient's daughter Alison Richardson updated, no questions or concerns.     Final next level of care: [place] Barriers to Discharge: No Barriers Identified   Patient Goals and CMS Choice Patient states their goals for this hospitalization and ongoing recovery are:: to go home CMS Medicare.gov Compare Post Acute Care list provided to:: Patient Choice offered to / list presented to : Patient  Discharge Placement              Patient chooses bed at:  [facility])     Patient and family notified of of transfer: 01/30/22  Discharge Plan and Services   Discharge Planning Services: CM Consult Post Acute Care Choice: [place]          DME Arranged: N/A DME Agency: NA       HH Arranged: PT, OT          Social Determinants of Health (SDOH) Interventions     Readmission Risk Interventions Readmission Risk Prevention Plan 01/26/2022 11/02/2021  Transportation Screening Complete Complete  PCP or Specialist Appt within 3-5 Days Complete Complete  HRI or [place] Complete Complete  Social Work Consult for  [place] Planning/Counseling Complete Complete  Palliative Care Screening Not Applicable Not Applicable  Medication Review [employer]) Complete Complete  Some recent data might be hidden

## 2022-01-31 ENCOUNTER — Encounter: Payer: Self-pay | Admitting: *Deleted

## 2022-01-31 ENCOUNTER — Other Ambulatory Visit: Payer: Self-pay | Admitting: *Deleted

## 2022-01-31 ENCOUNTER — Telehealth: Payer: Self-pay | Admitting: *Deleted

## 2022-01-31 DIAGNOSIS — I214 Non-ST elevation (NSTEMI) myocardial infarction: Secondary | ICD-10-CM

## 2022-01-31 NOTE — Telephone Encounter (Signed)
Transition Care Management Follow-up Telephone Call Date of discharge and from where: [facility] 01-30-2022 How have you been since you were released from the hospital? Tired  Any questions or concerns? No  Items Reviewed: Did the pt receive and understand the discharge instructions provided? Yes  Medications obtained and verified? Yes  Other? No  Any new allergies since your discharge? No  Dietary orders reviewed? No Do you have support at home? Yes   Home Care and Equipment/Supplies: Were home health services ordered? not applicable If so, what is the name of the agency?   Has the agency set up a time to come to the patient's home? not applicable Were any new equipment or medical supplies ordered?   What is the name of the medical supply agency?  Were you able to get the supplies/equipment? not applicable Do you have any questions related to the use of the equipment or supplies? No  Functional Questionnaire: (I = Independent and D = Dependent)   ADLs:  I    Bathing/Dressing- I  Meal Prep- I  Eating- I  Maintaining continence- I  Transferring/Ambulation- I  Managing Meds- I  Follow up appointments reviewed:  PCP Hospital f/u appt confirmed? Yes  Scheduled to see Mathis Dad on 02-07-2022 @ 10:40. [facility] f/u appt confirmed? No   Are transportation arrangements needed? No  If their condition worsens, is the pt aware to call PCP or go to the Emergency Dept.? Yes Was the patient provided with contact information for the PCP's office or ED? Yes Was to pt encouraged to call back with questions or concerns? Yes

## 2022-01-31 NOTE — Telephone Encounter (Signed)
Transition Care Management Unsuccessful Follow-up Telephone Call  Date of discharge and from where:  [facility] 01-30-2022  Attempts:  1st Attempt  Reason for unsuccessful TCM follow-up call:  Left voice message

## 2022-01-31 NOTE — Progress Notes (Signed)
Cardiac Individual Treatment Plan  Patient Details  Name: Alison Richardson MRN: [id number] Date of Birth: Sep 15, 1933 Referring Provider:   Flowsheet Row Cardiac Rehab from 01/22/2022 in [facility] Cardiac and Pulmonary Rehab  Referring Provider Corky Sox       Initial Encounter Date:  Flowsheet Row Cardiac Rehab from 01/22/2022 in [facility] Cardiac and Pulmonary Rehab  Date 01/22/22       Visit Diagnosis: NSTEMI (non-ST elevated myocardial infarction) [facility])  Patient's Home Medications on Admission:  Current Outpatient Medications:    amiodarone (PACERONE) 200 MG tablet, Take 200 mg by mouth daily. , Disp: , Rfl:    cephALEXin (KEFLEX) 500 MG capsule, Take 1 capsule (500 mg total) by mouth 4 (four) times daily., Disp: 12 capsule, Rfl: 0   cetirizine (ZYRTEC) 10 MG tablet, Take 10 mg by mouth daily. (Patient not taking: Reported on 01/25/2022), Disp: , Rfl:    clopidogrel (PLAVIX) 75 MG tablet, Take 75 mg by mouth daily., Disp: , Rfl:    donepezil (ARICEPT) 10 MG tablet, Take 10 mg by mouth at bedtime., Disp: , Rfl:    ELIQUIS 5 MG TABS tablet, Take 5 mg by mouth 2 (two) times daily. , Disp: , Rfl:    gabapentin (NEURONTIN) 100 MG capsule, Take 100 mg by mouth 2 (two) times daily., Disp: , Rfl:    glipiZIDE (GLUCOTROL XL) 2.5 MG 24 hr tablet, Take 2.5 mg by mouth daily., Disp: , Rfl:    isosorbide mononitrate (IMDUR) 30 MG 24 hr tablet, Take 30 mg by mouth daily., Disp: , Rfl:    levothyroxine (SYNTHROID) 88 MCG tablet, Take 88 mcg by mouth daily. , Disp: , Rfl:    lisinopril (ZESTRIL) 5 MG tablet, Take 5 mg by mouth daily., Disp: , Rfl:    metoprolol succinate (TOPROL-XL) 100 MG 24 hr tablet, TAKE ONE TABLET (100 MG) BY MOUTH EVERY DAY (TAKE WITH OR IMMEDIATELY AFTER A MEAL), Disp: 90 tablet, Rfl: 1   montelukast (SINGULAIR) 10 MG tablet, Take 1 tablet (10 mg total) by mouth at bedtime., Disp: 30 tablet, Rfl: 0   pravastatin (PRAVACHOL) 20 MG tablet, Take 20 mg by mouth at bedtime., Disp: , Rfl:     torsemide (DEMADEX) 20 MG tablet, Take 1 tablet (20 mg total) by mouth daily., Disp: 30 tablet, Rfl: 0   venlafaxine XR (EFFEXOR-XR) 75 MG 24 hr capsule, Take 75 mg by mouth daily with breakfast. , Disp: , Rfl:   Past Medical History: Past Medical History:  Diagnosis Date   Arrhythmia    Atrial flutter ([place]) 01/2018   new onset    Basal cell carcinoma of back    Basal cell carcinoma of lip    Cerebral aneurysm    followed by [facility]   CHF (congestive heart failure) (HCC)    DDD (degenerative disc disease), lumbar    superior plate depression, L3 08/18/2014   Diabetes mellitus type II, controlled ([place])    Diverticulosis    Dysrhythmia    Paroxysmal Supraventricular Tachycardia   Dysthymia    depression   GERD (gastroesophageal reflux disease)    History of meniscal tear    Hyperlipidemia    Hypertension    Hypothyroidism    Late onset Alzheimer's disease with behavioral disturbance ([place])    Mild pulmonary hypertension (HCC)    Overactive bladder    Peripheral vascular disease (HCC)    Seasonal allergic rhinitis    Sleep apnea     Tobacco Use: Social History   Tobacco Use  Smoking Status Never  Smokeless Tobacco Never    Labs: Recent Review Flowsheet Data     Labs for ITP Cardiac and Pulmonary Rehab Latest Ref Rng & Units 09/29/2019 05/10/2020 10/07/2020 10/16/2021 01/11/2022   Cholestrol 100 - 199 mg/dL - 166 - - 195   LDLCALC 0 - 99 mg/dL - 98 - - 102(H)   HDL >39 mg/dL - 51 - - 45   Trlycerides 0 - 149 mg/dL - 84 - - 281(H)   Hemoglobin A1c 4.8 - 5.6 % 7.4(H) 7.1(H) 7.1(H) 7.7(H) 7.1(H)        Exercise Target Goals: Exercise Program Goal: Individual exercise prescription set using results from initial 6 min walk test and THRR while considering  patients activity barriers and safety.   Exercise Prescription Goal: Initial exercise prescription builds to 30-45 minutes a day of aerobic activity, 2-3 days per week.  Home exercise guidelines will be given to patient  during program as part of exercise prescription that the participant will acknowledge.   Education: Aerobic Exercise: - Group verbal and visual presentation on the components of exercise prescription. Introduces F.I.T.T principle from ACSM for exercise prescriptions.  Reviews F.I.T.T. principles of aerobic exercise including progression. Written material given at graduation.   Education: Resistance Exercise: - Group verbal and visual presentation on the components of exercise prescription. Introduces F.I.T.T principle from ACSM for exercise prescriptions  Reviews F.I.T.T. principles of resistance exercise including progression. Written material given at graduation.    Education: Exercise & Equipment Safety: - Individual verbal instruction and demonstration of equipment use and safety with use of the equipment. Flowsheet Row Cardiac Rehab from 01/22/2022 in [facility] Cardiac and Pulmonary Rehab  Date 01/22/22  Educator AS  Instruction Review Code 1- Verbalizes Understanding       Education: Exercise Physiology & General Exercise Guidelines: - Group verbal and written instruction with models to review the exercise physiology of the cardiovascular system and associated critical values. Provides general exercise guidelines with specific guidelines to those with heart or lung disease.  Flowsheet Row Cardiac Rehab from 01/22/2022 in [facility] Cardiac and Pulmonary Rehab  Education need identified 01/22/22       Education: Flexibility, Balance, Mind/Body Relaxation: - Group verbal and visual presentation with interactive activity on the components of exercise prescription. Introduces F.I.T.T principle from ACSM for exercise prescriptions. Reviews F.I.T.T. principles of flexibility and balance exercise training including progression. Also discusses the mind body connection.  Reviews various relaxation techniques to help reduce and manage stress (i.e. Deep breathing, progressive muscle relaxation, and  visualization). Balance handout provided to take home. Written material given at graduation.   Activity Barriers & Risk Stratification:  Activity Barriers & Cardiac Risk Stratification - 12/13/21 1403       Activity Barriers & Cardiac Risk Stratification   Activity Barriers Balance Concerns;Joint Problems;Arthritis;Left Knee Replacement;Muscular Weakness    Cardiac Risk Stratification High             6 Minute Walk:  6 Minute Walk     Row Name 01/22/22 1658         6 Minute Walk   Phase Initial     Distance 1000 feet     Walk Time 6 minutes     # of Rest Breaks 0     MPH 1.9     METS 1.26     RPE 13     Perceived Dyspnea  2     VO2 Peak 4.4     Symptoms No  Resting HR 56 bpm     Resting BP 98/52     Resting Oxygen Saturation  93 %     Exercise Oxygen Saturation  during 6 min walk 94 %     Max Ex. HR 88 bpm     Max Ex. BP 130/60     2 Minute Post BP 116/60              Oxygen Initial Assessment:   Oxygen Re-Evaluation:   Oxygen Discharge (Final Oxygen Re-Evaluation):   Initial Exercise Prescription:  Initial Exercise Prescription - 01/22/22 1600       Date of Initial Exercise RX and Referring Provider   Date 01/22/22    Referring Provider Corky Sox      Oxygen   Maintain Oxygen Saturation 88% or higher      Treadmill   MPH 1.5    Grade 0    Minutes 15    METs 2.15      Recumbant Bike   Level 1    RPM 60    Minutes 15    METs 1.3      NuStep   Level 1    SPM 80    Minutes 15    METs 1.3      T5 Nustep   Level 1    SPM 80    Minutes 15    METs 1.3      Track   Laps 20    Minutes 15      Prescription Details   Frequency (times per week) 3    Duration Progress to 30 minutes of continuous aerobic without signs/symptoms of physical distress      Intensity   THRR 40-80% of Max Heartrate 87-118    Ratings of Perceived Exertion 11-13    Perceived Dyspnea 0-4      Resistance Training   Training Prescription Yes    Weight  3 lb    Reps 10-15             Perform Capillary Blood Glucose checks as needed.  Exercise Prescription Changes:   Exercise Prescription Changes     Row Name 01/22/22 1700             Response to Exercise   Blood Pressure (Admit) 98/52       Blood Pressure (Exercise) 130/60       Blood Pressure (Exit) 116/60       Heart Rate (Admit) 56 bpm       Heart Rate (Exercise) 88 bpm       Heart Rate (Exit) 66 bpm       Oxygen Saturation (Admit) 93 %       Oxygen Saturation (Exercise) 94 %       Rating of Perceived Exertion (Exercise) 13       Perceived Dyspnea (Exercise) 2       Symptoms none                Exercise Comments:   Exercise Goals and Review:   Exercise Goals     Row Name 01/22/22 1704             Exercise Goals   Increase Physical Activity Yes       Intervention Provide advice, education, support and counseling about physical activity/exercise needs.;Develop an individualized exercise prescription for aerobic and resistive training based on initial evaluation findings, risk stratification, comorbidities and participant's personal goals.       Expected Outcomes  Short Term: Attend rehab on a regular basis to increase amount of physical activity.;Long Term: Add in home exercise to make exercise part of routine and to increase amount of physical activity.;Long Term: Exercising regularly at least 3-5 days a week.       Increase Strength and Stamina Yes       Intervention Provide advice, education, support and counseling about physical activity/exercise needs.;Develop an individualized exercise prescription for aerobic and resistive training based on initial evaluation findings, risk stratification, comorbidities and participant's personal goals.       Expected Outcomes Short Term: Increase workloads from initial exercise prescription for resistance, speed, and METs.;Short Term: Perform resistance training exercises routinely during rehab and add in resistance  training at home;Long Term: Improve cardiorespiratory fitness, muscular endurance and strength as measured by increased METs and functional capacity (6MWT)       Able to understand and use rate of perceived exertion (RPE) scale Yes       Intervention Provide education and explanation on how to use RPE scale       Expected Outcomes Short Term: Able to use RPE daily in rehab to express subjective intensity level;Long Term:  Able to use RPE to guide intensity level when exercising independently       Able to understand and use Dyspnea scale Yes       Intervention Provide education and explanation on how to use Dyspnea scale       Expected Outcomes Short Term: Able to use Dyspnea scale daily in rehab to express subjective sense of shortness of breath during exertion;Long Term: Able to use Dyspnea scale to guide intensity level when exercising independently       Knowledge and understanding of Target Heart Rate Range (THRR) Yes       Intervention Provide education and explanation of THRR including how the numbers were predicted and where they are located for reference       Expected Outcomes Short Term: Able to state/look up THRR;Short Term: Able to use daily as guideline for intensity in rehab;Long Term: Able to use THRR to govern intensity when exercising independently       Able to check pulse independently Yes       Intervention Provide education and demonstration on how to check pulse in carotid and radial arteries.;Review the importance of being able to check your own pulse for safety during independent exercise       Expected Outcomes Short Term: Able to explain why pulse checking is important during independent exercise;Long Term: Able to check pulse independently and accurately       Understanding of Exercise Prescription Yes       Intervention Provide education, explanation, and written materials on patient's individual exercise prescription       Expected Outcomes Short Term: Able to explain  program exercise prescription;Long Term: Able to explain home exercise prescription to exercise independently                Exercise Goals Re-Evaluation :   Discharge Exercise Prescription (Final Exercise Prescription Changes):  Exercise Prescription Changes - 01/22/22 1700       Response to Exercise   Blood Pressure (Admit) 98/52    Blood Pressure (Exercise) 130/60    Blood Pressure (Exit) 116/60    Heart Rate (Admit) 56 bpm    Heart Rate (Exercise) 88 bpm    Heart Rate (Exit) 66 bpm    Oxygen Saturation (Admit) 93 %    Oxygen Saturation (Exercise)  94 %    Rating of Perceived Exertion (Exercise) 13    Perceived Dyspnea (Exercise) 2    Symptoms none             Nutrition:  Target Goals: Understanding of nutrition guidelines, daily intake of sodium 1500mg , cholesterol 200mg , calories 30% from fat and 7% or less from saturated fats, daily to have 5 or more servings of fruits and vegetables.  Education: All About Nutrition: -Group instruction provided by verbal, written material, interactive activities, discussions, models, and posters to present general guidelines for heart healthy nutrition including fat, fiber, MyPlate, the role of sodium in heart healthy nutrition, utilization of the nutrition label, and utilization of this knowledge for meal planning. Follow up email sent as well. Written material given at graduation. Flowsheet Row Cardiac Rehab from 01/22/2022 in [facility] Cardiac and Pulmonary Rehab  Education need identified 01/22/22       Biometrics:  Pre Biometrics - 01/22/22 1706       Pre Biometrics   Height 5\' 6"  (1.676 m)    Weight 181 lb 4.8 oz (82.2 kg)    BMI (Calculated) 29.28    Single Leg Stand 3.5 seconds              Nutrition Therapy Plan and Nutrition Goals:   Nutrition Assessments:  MEDIFICTS Score Key: ?70 Need to make dietary changes  40-70 Heart Healthy Diet ? 40 Therapeutic Level Cholesterol Diet  Flowsheet Row Cardiac  Rehab from 01/22/2022 in [facility] Cardiac and Pulmonary Rehab  Picture Your Plate Total Score on Admission 75      Picture Your Plate Scores: <83 Unhealthy dietary pattern with much room for improvement. 41-50 Dietary pattern unlikely to meet recommendations for good health and room for improvement. 51-60 More healthful dietary pattern, with some room for improvement.  >60 Healthy dietary pattern, although there may be some specific behaviors that could be improved.    Nutrition Goals Re-Evaluation:   Nutrition Goals Discharge (Final Nutrition Goals Re-Evaluation):   Psychosocial: Target Goals: Acknowledge presence or absence of significant depression and/or stress, maximize coping skills, provide positive support system. Participant is able to verbalize types and ability to use techniques and skills needed for reducing stress and depression.   Education: Stress, Anxiety, and Depression - Group verbal and visual presentation to define topics covered.  Reviews how body is impacted by stress, anxiety, and depression.  Also discusses healthy ways to reduce stress and to treat/manage anxiety and depression.  Written material given at graduation.   Education: Sleep Hygiene -Provides group verbal and written instruction about how sleep can affect your health.  Define sleep hygiene, discuss sleep cycles and impact of sleep habits. Review good sleep hygiene tips.    Initial Review & Psychosocial Screening:  Initial Psych Review & Screening - 12/13/21 1406       Initial Review   Current issues with Current Sleep Concerns;History of Depression      Family Dynamics   Good Support System? Yes   daughter, neighbors at [facility]     Barriers   Psychosocial barriers to participate in program There are no identifiable barriers or psychosocial needs.;The patient should benefit from training in stress management and relaxation.      Screening Interventions   Interventions Encouraged to  exercise;Provide feedback about the scores to participant;To provide support and resources with identified psychosocial needs    Expected Outcomes Short Term goal: Utilizing psychosocial counselor, staff and physician to assist with identification of specific Stressors  or current issues interfering with healing process. Setting desired goal for each stressor or current issue identified.;Short Term goal: Identification and review with participant of any Quality of Life or Depression concerns found by scoring the questionnaire.;Long Term goal: The participant improves quality of Life and PHQ9 Scores as seen by post scores and/or verbalization of changes;Long Term Goal: Stressors or current issues are controlled or eliminated.             Quality of Life Scores:   Quality of Life - 01/22/22 1708       Quality of Life   Select Quality of Life      Quality of Life Scores   Health/Function Pre 20.36 %    Socioeconomic Pre 24.5 %    Psych/Spiritual Pre 20.07 %    Family Pre 25.13 %    GLOBAL Pre 21.62 %            Scores of 19 and below usually indicate a poorer quality of life in these areas.  A difference of  2-3 points is a clinically meaningful difference.  A difference of 2-3 points in the total score of the Quality of Life Index has been associated with significant improvement in overall quality of life, self-image, physical symptoms, and general health in studies assessing change in quality of life.  PHQ-9: Recent Review Flowsheet Data     Depression screen [facility] 2/9 01/22/2022 01/11/2022 05/12/2021   Decreased Interest 1 2 0   Down, Depressed, Hopeless 0 1 0   PHQ - 2 Score 1 3 0   Altered sleeping 3 2 -   Tired, decreased energy 3 3 -   Change in appetite 2 2 -   Feeling bad or failure about yourself  0 0 -   Trouble concentrating 0 0 -   Moving slowly or fidgety/restless 0 0 -   Suicidal thoughts 0 0 -   PHQ-9 Score 9 10 -   Difficult doing work/chores Somewhat difficult   Somewhat difficult -      Interpretation of Total Score  Total Score Depression Severity:  1-4 = Minimal depression, 5-9 = Mild depression, 10-14 = Moderate depression, 15-19 = Moderately severe depression, 20-27 = Severe depression   Psychosocial Evaluation and Intervention:  Psychosocial Evaluation - 12/13/21 1419       Psychosocial Evaluation & Interventions   Comments Ms. Delsanto reports doing okay after her NSTEMI. She has noticed increased weakness and balance concerns. She relies more on her walker than before. She states her diabetes and blood pressure are being managed well. She does have a history of depression and reports her medication is managing her symptoms. Her biggest concern is sleep, in that she doesn't sleep well and needs to wear her CPAP more but it dries her nostrils out too much. She takes medication to sleep that helps sometimes. She lives in [place] and enjoys her neighbors/friends and is thankful her daughter lives close by to help her when needed. She wants to maintain her independence as long as possible so she is ready to start the program to gain strength and stamina    Expected Outcomes Short: attend cardiac rehab for education and exercise. Long: develop and maintain positive self care habits.    Continue Psychosocial Services  Follow up required by staff             Psychosocial Re-Evaluation:   Psychosocial Discharge (Final Psychosocial Re-Evaluation):   Vocational Rehabilitation: Provide vocational rehab assistance to qualifying candidates.  Vocational Rehab Evaluation & Intervention:  Vocational Rehab - 12/13/21 1406       Initial Vocational Rehab Evaluation & Intervention   Assessment shows need for Vocational Rehabilitation No             Education: Education Goals: Education classes will be provided on a variety of topics geared toward better understanding of heart health and risk factor modification. Participant will state  understanding/return demonstration of topics presented as noted by education test scores.  Learning Barriers/Preferences:  Learning Barriers/Preferences - 12/13/21 1406       Learning Barriers/Preferences   Learning Barriers None    Learning Preferences Individual Instruction             General Cardiac Education Topics:  AED/CPR: - Group verbal and written instruction with the use of models to demonstrate the basic use of the AED with the basic ABC's of resuscitation.   Anatomy and Cardiac Procedures: - Group verbal and visual presentation and models provide information about basic cardiac anatomy and function. Reviews the testing methods done to diagnose heart disease and the outcomes of the test results. Describes the treatment choices: Medical Management, Angioplasty, or Coronary Bypass Surgery for treating various heart conditions including Myocardial Infarction, Angina, Valve Disease, and Cardiac Arrhythmias.  Written material given at graduation.   Medication Safety: - Group verbal and visual instruction to review commonly prescribed medications for heart and lung disease. Reviews the medication, class of the drug, and side effects. Includes the steps to properly store meds and maintain the prescription regimen.  Written material given at graduation.   Intimacy: - Group verbal instruction through game format to discuss how heart and lung disease can affect sexual intimacy. Written material given at graduation..   Know Your Numbers and Heart Failure: - Group verbal and visual instruction to discuss disease risk factors for cardiac and pulmonary disease and treatment options.  Reviews associated critical values for Overweight/Obesity, Hypertension, Cholesterol, and Diabetes.  Discusses basics of heart failure: signs/symptoms and treatments.  Introduces Heart Failure Zone chart for action plan for heart failure.  Written material given at graduation.   Infection  Prevention: - Provides verbal and written material to individual with discussion of infection control including proper hand washing and proper equipment cleaning during exercise session. Flowsheet Row Cardiac Rehab from 01/22/2022 in [facility] Cardiac and Pulmonary Rehab  Date 01/22/22  Educator AS  Instruction Review Code 1- Verbalizes Understanding       Falls Prevention: - Provides verbal and written material to individual with discussion of falls prevention and safety. Flowsheet Row Cardiac Rehab from 01/22/2022 in [facility] Cardiac and Pulmonary Rehab  Date 01/22/22  Educator AS  Instruction Review Code 1- Verbalizes Understanding       Other: -Provides group and verbal instruction on various topics (see comments)   Knowledge Questionnaire Score:  Knowledge Questionnaire Score - 01/22/22 1714       Knowledge Questionnaire Score   Pre Score 23/26             Core Components/Risk Factors/Patient Goals at Admission:  Personal Goals and Risk Factors at Admission - 12/13/21 1405       Core Components/Risk Factors/Patient Goals on Admission   Diabetes Yes    Intervention Provide education about signs/symptoms and action to take for hypo/hyperglycemia.;Provide education about proper nutrition, including hydration, and aerobic/resistive exercise prescription along with prescribed medications to achieve blood glucose in normal ranges: Fasting glucose 65-99 mg/dL    Expected Outcomes Short Term: Participant  verbalizes understanding of the signs/symptoms and immediate care of hyper/hypoglycemia, proper foot care and importance of medication, aerobic/resistive exercise and nutrition plan for blood glucose control.;Long Term: Attainment of HbA1C < 7%.    Hypertension Yes    Intervention Provide education on lifestyle modifcations including regular physical activity/exercise, weight management, moderate sodium restriction and increased consumption of fresh fruit, vegetables, and low fat  dairy, alcohol moderation, and smoking cessation.;Monitor prescription use compliance.    Expected Outcomes Short Term: Continued assessment and intervention until BP is < 140/85mm HG in hypertensive participants. < 130/59mm HG in hypertensive participants with diabetes, heart failure or chronic kidney disease.;Long Term: Maintenance of blood pressure at goal levels.    Lipids Yes    Intervention Provide education and support for participant on nutrition & aerobic/resistive exercise along with prescribed medications to achieve LDL 70mg , HDL >40mg .    Expected Outcomes Short Term: Participant states understanding of desired cholesterol values and is compliant with medications prescribed. Participant is following exercise prescription and nutrition guidelines.;Long Term: Cholesterol controlled with medications as prescribed, with individualized exercise RX and with personalized nutrition plan. Value goals: LDL < 70mg , HDL > 40 mg.             Education:Diabetes - Individual verbal and written instruction to review signs/symptoms of diabetes, desired ranges of glucose level fasting, after meals and with exercise. Acknowledge that pre and post exercise glucose checks will be done for 3 sessions at entry of program. Flowsheet Row Cardiac Rehab from 12/13/2021 in [facility]  Date 12/13/21  Educator [facility]  Instruction Review Code 1- Verbalizes Understanding       Core Components/Risk Factors/Patient Goals Review:    Core Components/Risk Factors/Patient Goals at Discharge (Final Review):    ITP Comments:  ITP Comments     [place] Name 12/13/21 1414 01/22/22 1712 01/31/22 1339       ITP Comments Initial telephone orientation completed. Diagnosis can be found in [facility] 12/1. EP orientation scheduled for Thursday 1/19 at 2pm. Completed 6MWT and gym orientation. Initial ITP created and sent for review to Dr. Emily Filbert, Medical Director. Alison Richardson called stating she is unable to start  the program right now. She was just discharged from the hospital with orders for home health PT. Instructions given to contact staff when she is ready to get started.              Comments: Discharge ITP

## 2022-02-06 ENCOUNTER — Other Ambulatory Visit: Payer: Self-pay | Admitting: Nurse Practitioner

## 2022-02-06 ENCOUNTER — Ambulatory Visit: Payer: Self-pay | Admitting: *Deleted

## 2022-02-06 NOTE — Telephone Encounter (Signed)
Requested Prescriptions  Pending Prescriptions Disp Refills   montelukast (SINGULAIR) 10 MG tablet [Pharmacy Med Name: MONTELUKAST SODIUM 10 MG TAB] 30 tablet 0    Sig: TAKE ONE TABLET BY MOUTH AT BEDTIME     Pulmonology:  Leukotriene Inhibitors Passed - 02/06/2022 11:46 AM      Passed - Valid encounter within last 12 months    Recent Outpatient Visits          3 weeks ago Paroxysmal atrial fibrillation with rapid ventricular response [facility])   [facility] Jon Billings, NP      Future Appointments            In 2 days Valerie Roys, DO [employer], [place]   In 6 days Jon Billings, NP [facility], [place]

## 2022-02-06 NOTE — Telephone Encounter (Signed)
°  Chief Complaint: patient's daughter on DPR, reports patient feels like passing out  Symptoms: feels faint when standing , dizziness, nausea. Only drinking coffee x 2 today  Frequency: started yesterday  Pertinent Negatives: Patient denies chest pain, difficulty breathing, passing out or fainting, elevated heart rate. No fever Disposition: [] ED /[] Urgent Care (no appt availability in office) / [x] Appointment(In office/virtual)/ []  [facility]/ [] Home Care/ [] Refused Recommended Disposition /[] [facility]/ []  Follow-up with PCP Additional Notes:   Recommended to go to UC or ED if symptoms worsen. Increase fluid intake of water but not to exceed recommended amounts due to heart issues. Scheduled appt 02/08/22 . Please advise if earlier appt available . Please advise if PCP wants patient to go to ED today or ok to try to increase fluids slowly.    Reason for Disposition  [1] MODERATE dizziness (e.g., interferes with normal activities) AND [2] has NOT been evaluated by physician for this  (Exception: dizziness caused by heat exposure, sudden standing, or poor fluid intake)  Answer Assessment - Initial Assessment Questions 1. DESCRIPTION: "Describe your dizziness."     lightheaded 2. LIGHTHEADED: "Do you feel lightheaded?" (e.g., somewhat faint, woozy, weak upon standing)     Feels faint upon standing  3. VERTIGO: "Do you feel like either you or the room is spinning or tilting?" (i.e. vertigo)     no 4. SEVERITY: "How bad is it?"  "Do you feel like you are going to faint?" "Can you stand and walk?"   - MILD: Feels slightly dizzy, but walking normally.   - MODERATE: Feels unsteady when walking, but not falling; interferes with normal activities (e.g., school, work).   - SEVERE: Unable to walk without falling, or requires assistance to walk without falling; feels like passing out now.      Mild but uses walker  5. ONSET:  "When did the dizziness begin?"     Yesterday  6.  AGGRAVATING FACTORS: "Does anything make it worse?" (e.g., standing, change in head position)     Standing  7. HEART RATE: "Can you tell me your heart rate?" "How many beats in 15 seconds?"  (Note: not all patients can do this)       Na  8. CAUSE: "What do you think is causing the dizziness?"     Not sure  9. RECURRENT SYMPTOM: "Have you had dizziness before?" If Yes, ask: "When was the last time?" "What happened that time?"     Not sure  10. OTHER SYMPTOMS: "Do you have any other symptoms?" (e.g., fever, chest pain, vomiting, diarrhea, bleeding)       feels 11. PREGNANCY: "Is there any chance you are pregnant?" "When was your last menstrual period?"       na  Protocols used: Dizziness - Lightheadedness-A-AH

## 2022-02-07 ENCOUNTER — Encounter: Payer: Self-pay | Admitting: Nurse Practitioner

## 2022-02-07 ENCOUNTER — Ambulatory Visit: Payer: Medicare PPO | Admitting: Nurse Practitioner

## 2022-02-07 ENCOUNTER — Other Ambulatory Visit: Payer: Self-pay

## 2022-02-07 ENCOUNTER — Inpatient Hospital Stay: Payer: Medicare PPO | Admitting: Nurse Practitioner

## 2022-02-07 VITALS — BP 98/63 | HR 119 | Temp 97.7°F | Wt 169.2 lb

## 2022-02-07 DIAGNOSIS — R6520 Severe sepsis without septic shock: Secondary | ICD-10-CM

## 2022-02-07 DIAGNOSIS — A419 Sepsis, unspecified organism: Secondary | ICD-10-CM | POA: Diagnosis not present

## 2022-02-07 DIAGNOSIS — N3001 Acute cystitis with hematuria: Secondary | ICD-10-CM

## 2022-02-07 DIAGNOSIS — R42 Dizziness and giddiness: Secondary | ICD-10-CM | POA: Diagnosis not present

## 2022-02-07 DIAGNOSIS — Z09 Encounter for follow-up examination after completed treatment for conditions other than malignant neoplasm: Secondary | ICD-10-CM

## 2022-02-07 HISTORY — DX: Sepsis, unspecified organism: R65.20

## 2022-02-07 LAB — URINALYSIS, ROUTINE W REFLEX MICROSCOPIC
Bilirubin, UA: NEGATIVE
Glucose, UA: NEGATIVE
Ketones, UA: NEGATIVE
Nitrite, UA: NEGATIVE
Protein,UA: NEGATIVE
RBC, UA: NEGATIVE
Specific Gravity, UA: 1.015 (ref 1.005–1.030)
Urobilinogen, Ur: 0.2 mg/dL (ref 0.2–1.0)
pH, UA: 6.5 (ref 5.0–7.5)

## 2022-02-07 LAB — MICROSCOPIC EXAMINATION
Bacteria, UA: NONE SEEN
RBC, Urine: NONE SEEN /hpf (ref 0–2)

## 2022-02-07 MED ORDER — CEPHALEXIN 500 MG PO CAPS: 500.0000 mg | ORAL_CAPSULE | Freq: Four times a day (QID) | ORAL | 0 refills | Status: AC

## 2022-02-07 NOTE — Progress Notes (Signed)
BP 98/63    Pulse (!) 119    Temp 97.7 F (36.5 C) (Oral)    Wt 169 lb 3.2 oz (76.7 kg)    SpO2 96%    BMI 27.31 kg/m    Subjective:    Patient ID: Alison Richardson, female    DOB: 12/24/1933, 88 y.o.   MRN: [id number]  HPI: Alison Richardson is a 88 y.o. female  Chief Complaint  Patient presents with   Hospitalization Follow-up    Patient presents to clinic with light headedness, fatigue, feels like she is going to fait if she gets up.  when she gets up.  Feels like she may fall when she gets up. She was nauseous yesterday but not today.  She finished the Keflex on 02/02/22 that was prescribed by the hospital.  She is drinking 1 cup of water every couple of hours.     Transition of Care Hospital Follow up.   Hospital/Facility: [facility] D/C Physician: Dr. Ailene Rud D/C Date: 01/30/22  Records Requested: NA Records Received: NA Records Reviewed: Yes  Diagnoses on Discharge:   # Acute cystitis # Sepsis # E. Coli bacteremia One week urinary frequency, one episode vomiting at home, urinalysis suggestive of infection. The likely inciting event for her other hospital problems. Sepsis by leukocytosis, tachycardia, lactic acidosis. S/p fluid resuscitation. GNRs growing in 4/4. Abx started after UA was obtained so though urine culture is neg suspect that to be the source. Symptomatically improved. Cultures pan-sensitive - stopped ceftriaxone (2/2-2/4), discussed w/ pharmacy, started keflex 500 q6 on 2/4, plan for 7 day course   # A-fib with rvr # Flash pulmonary edema # Acute hypoxic respiratory failure Treated for sepsis with liberal fluids in the ED. Developed flash pulmonary edema and a fib with rvr. Treated with bipap and lasix. Cardiology now following. Weaned off bipap and now off o2. Amio gtt discontinued  - resume home meds - cardiology f/u 2 wks   # Demand ischemia Trop [id number], no chest pain, likely demand from above processes   # Hypokalemia Resolved w/ supplementation -  monitor   # T2DM Here mildly hyperglycemic   # Fall # Shoulder injury With right shoulder/arm pain. X-ray neg for fracture. Normal rom hip without pain. Unable to abduct arm, in pain, family very concerned. Ortho consulted 2/4, thinks possible rotator cuff injury, CSI provided. Pain improving - ortho f/u prn   # Acute kidney injury 2/2 uti, immobility. Resolved w/ fluids - monitor  Date of interactive Contact within 48 hours of discharge:  Contact was through: phone  Date of 7 day or 14 day face-to-face visit:    within 7 days   Outpatient Encounter Medications as of 02/07/2022  Medication Sig   amiodarone (PACERONE) 200 MG tablet Take 200 mg by mouth daily.    cephALEXin (KEFLEX) 500 MG capsule Take 1 capsule (500 mg total) by mouth 4 (four) times daily for 7 days.   clopidogrel (PLAVIX) 75 MG tablet Take 75 mg by mouth daily.   donepezil (ARICEPT) 10 MG tablet Take 10 mg by mouth at bedtime.   ELIQUIS 5 MG TABS tablet Take 5 mg by mouth 2 (two) times daily.    gabapentin (NEURONTIN) 100 MG capsule Take 100 mg by mouth 2 (two) times daily.   glipiZIDE (GLUCOTROL XL) 2.5 MG 24 hr tablet Take 2.5 mg by mouth daily.   isosorbide mononitrate (IMDUR) 30 MG 24 hr tablet Take 30 mg by mouth daily.   levothyroxine (SYNTHROID) 88  MCG tablet Take 88 mcg by mouth daily.    lisinopril (ZESTRIL) 5 MG tablet Take 5 mg by mouth daily.   metoprolol succinate (TOPROL-XL) 100 MG 24 hr tablet TAKE ONE TABLET (100 MG) BY MOUTH EVERY DAY (TAKE WITH OR IMMEDIATELY AFTER A MEAL)   montelukast (SINGULAIR) 10 MG tablet TAKE ONE TABLET BY MOUTH AT BEDTIME   nitroGLYCERIN (NITROSTAT) 0.4 MG SL tablet Place 0.4 mg under the tongue every 5 (five) minutes as needed for chest pain.   pravastatin (PRAVACHOL) 20 MG tablet Take 20 mg by mouth at bedtime.   torsemide (DEMADEX) 20 MG tablet Take 1 tablet (20 mg total) by mouth daily.   venlafaxine XR (EFFEXOR-XR) 75 MG 24 hr capsule Take 75 mg by mouth daily with  breakfast.    [DISCONTINUED] cephALEXin (KEFLEX) 500 MG capsule Take 1 capsule (500 mg total) by mouth 4 (four) times daily.   [DISCONTINUED] cetirizine (ZYRTEC) 10 MG tablet Take 10 mg by mouth daily. (Patient not taking: Reported on 01/25/2022)   No facility-administered encounter medications on file as of 02/07/2022.    Diagnostic Tests Reviewed/Disposition: Reviewed  Consults: Patient needs to follow up with Cardiology  Discharge Instructions: Reviewed and discussed with patient during visit.  Disease/illness Education: Provided during visit.  Home Health/Community Services Discussions/Referrals: NA  Establishment or re-establishment of referral orders for community resources: NA  Discussion with other health care providers: Will need to follow up with Cardiology  Assessment and Support of treatment regimen adherence: Performed at visit  Appointments Coordinated with: NA  Education for self-management, independent living, and ADLs: Provided    Relevant past medical, surgical, family and social history reviewed and updated as indicated. Interim medical history since our last visit reviewed. Allergies and medications reviewed and updated.  Review of Systems  Constitutional:  Positive for fatigue.  Neurological:  Positive for light-headedness.   Per HPI unless specifically indicated above     Objective:    BP 98/63    Pulse (!) 119    Temp 97.7 F (36.5 C) (Oral)    Wt 169 lb 3.2 oz (76.7 kg)    SpO2 96%    BMI 27.31 kg/m   Wt Readings from Last 3 Encounters:  02/07/22 169 lb 3.2 oz (76.7 kg)  01/25/22 178 lb (80.7 kg)  01/22/22 181 lb 4.8 oz (82.2 kg)    Physical Exam Vitals and nursing note reviewed.  Constitutional:      General: She is not in acute distress.    Appearance: Normal appearance. She is normal weight. She is not ill-appearing, toxic-appearing or diaphoretic.  HENT:     Head: Normocephalic.     Right Ear: External ear normal.     Left Ear:  External ear normal.     Nose: Nose normal.     Mouth/Throat:     Mouth: Mucous membranes are moist.     Pharynx: Oropharynx is clear.  Eyes:     General:        Right eye: No discharge.        Left eye: No discharge.     Extraocular Movements: Extraocular movements intact.     Conjunctiva/sclera: Conjunctivae normal.     Pupils: Pupils are equal, round, and reactive to light.  Cardiovascular:     Rate and Rhythm: Normal rate and regular rhythm.     Heart sounds: No murmur heard. Pulmonary:     Effort: Pulmonary effort is normal. No respiratory distress.     Breath  sounds: Normal breath sounds. No wheezing or rales.  Musculoskeletal:     Cervical back: Normal range of motion and neck supple.  Skin:    General: Skin is cool and dry.     Capillary Refill: Capillary refill takes less than 2 seconds.  Neurological:     General: No focal deficit present.     Mental Status: She is alert and oriented to person, place, and time. Mental status is at baseline.  Psychiatric:        Mood and Affect: Mood normal.        Behavior: Behavior normal.        Thought Content: Thought content normal.        Judgment: Judgment normal.    Results for orders placed or performed during the hospital encounter of 01/25/22  Resp Panel by RT-PCR (Flu A&B, Covid) Nasopharyngeal Swab   Specimen: Nasopharyngeal Swab; Nasopharyngeal(NP) swabs in vial transport medium  Result Value Ref Range   SARS Coronavirus 2 by RT PCR NEGATIVE NEGATIVE   Influenza A by PCR NEGATIVE NEGATIVE   Influenza B by PCR NEGATIVE NEGATIVE  Blood Culture (routine x 2)   Specimen: BLOOD  Result Value Ref Range   Specimen Description      BLOOD BLOOD RIGHT FOREARM Performed at [facility], [address], [place], [place] [address]    Special Requests      BOTTLES DRAWN AEROBIC AND ANAEROBIC Blood Culture results may not be optimal due to an inadequate volume of blood received in culture bottles Performed at  [facility], [address], [place], [place] [address]    Culture  Setup Time      GRAM NEGATIVE RODS IN BOTH AEROBIC AND ANAEROBIC BOTTLES Organism ID to follow CRITICAL RESULT CALLED TO, READ BACK BY AND VERIFIED WITH: RODNEY GRUBB 01/25/22 2040 MU    Culture ESCHERICHIA COLI (A)    Report Status 01/27/2022 FINAL    Organism ID, Bacteria ESCHERICHIA COLI       Susceptibility   Escherichia coli - MIC*    AMPICILLIN 4 SENSITIVE Sensitive     CEFAZOLIN <=4 SENSITIVE Sensitive     CEFEPIME <=0.12 SENSITIVE Sensitive     CEFTAZIDIME <=1 SENSITIVE Sensitive     CEFTRIAXONE <=0.25 SENSITIVE Sensitive     CIPROFLOXACIN <=0.25 SENSITIVE Sensitive     GENTAMICIN <=1 SENSITIVE Sensitive     IMIPENEM <=0.25 SENSITIVE Sensitive     TRIMETH/SULFA <=20 SENSITIVE Sensitive     AMPICILLIN/SULBACTAM <=2 SENSITIVE Sensitive     PIP/TAZO <=4 SENSITIVE Sensitive     * ESCHERICHIA COLI  Blood Culture (routine x 2)   Specimen: BLOOD  Result Value Ref Range   Specimen Description      BLOOD BLOOD LEFT FOREARM Performed at [facility], [address] [place], [place] [address]    Special Requests      BOTTLES DRAWN AEROBIC AND ANAEROBIC Blood Culture results may not be optimal due to an inadequate volume of blood received in culture bottles Performed at [facility], [address] [place], [place] [address]    Culture  Setup Time      GRAM NEGATIVE RODS IN BOTH AEROBIC AND ANAEROBIC BOTTLES CRITICAL VALUE NOTED.  VALUE IS CONSISTENT WITH PREVIOUSLY REPORTED AND CALLED VALUE.    Culture (A)     ESCHERICHIA COLI SUSCEPTIBILITIES PERFORMED ON PREVIOUS CULTURE WITHIN THE LAST 5 DAYS. Performed at [facility], [place] [address] [place], [place] [address]  Report Status 01/27/2022 FINAL   Urine Culture   Specimen: In/Out Cath Urine  Result Value Ref Range   Specimen Description      IN/OUT CATH URINE Performed at [facility], [address], [place], [place] [address]    Special Requests      NONE Performed at [facility], [address], [place], [place] [address]    Culture      NO GROWTH Performed at [facility], [place] [address] [place], [place] [address]    Report Status 01/26/2022 FINAL   Blood Culture ID Panel (Reflexed)  Result Value Ref Range   Enterococcus faecalis NOT DETECTED NOT DETECTED   Enterococcus Faecium NOT DETECTED NOT DETECTED   Listeria monocytogenes NOT DETECTED NOT DETECTED   Staphylococcus species NOT DETECTED NOT DETECTED   Staphylococcus aureus (BCID) NOT DETECTED NOT DETECTED   Staphylococcus epidermidis NOT DETECTED NOT DETECTED   Staphylococcus lugdunensis NOT DETECTED NOT DETECTED   Streptococcus species NOT DETECTED NOT DETECTED   Streptococcus agalactiae NOT DETECTED NOT DETECTED   Streptococcus pneumoniae NOT DETECTED NOT DETECTED   Streptococcus pyogenes NOT DETECTED NOT DETECTED   A.calcoaceticus-baumannii NOT DETECTED NOT DETECTED   Bacteroides fragilis NOT DETECTED NOT DETECTED   Enterobacterales DETECTED (A) NOT DETECTED   Enterobacter cloacae complex NOT DETECTED NOT DETECTED   Escherichia coli DETECTED (A) NOT DETECTED   Klebsiella aerogenes NOT DETECTED NOT DETECTED   Klebsiella oxytoca NOT DETECTED NOT DETECTED   Klebsiella pneumoniae NOT DETECTED NOT DETECTED   Proteus species NOT DETECTED NOT DETECTED   Salmonella species NOT DETECTED NOT DETECTED   Serratia marcescens NOT DETECTED NOT DETECTED   Haemophilus influenzae NOT DETECTED NOT DETECTED   Neisseria meningitidis NOT DETECTED NOT DETECTED   Pseudomonas aeruginosa NOT DETECTED NOT DETECTED   Stenotrophomonas maltophilia NOT DETECTED NOT DETECTED   Candida albicans NOT DETECTED NOT DETECTED   Candida auris NOT DETECTED NOT DETECTED   Candida glabrata NOT DETECTED NOT DETECTED   Candida krusei NOT DETECTED NOT DETECTED   Candida parapsilosis NOT DETECTED NOT DETECTED   Candida tropicalis NOT  DETECTED NOT DETECTED   Cryptococcus neoformans/gattii NOT DETECTED NOT DETECTED   CTX-M ESBL NOT DETECTED NOT DETECTED   Carbapenem resistance IMP NOT DETECTED NOT DETECTED   Carbapenem resistance KPC NOT DETECTED NOT DETECTED   Carbapenem resistance NDM NOT DETECTED NOT DETECTED   Carbapenem resist OXA 48 LIKE NOT DETECTED NOT DETECTED   Carbapenem resistance VIM NOT DETECTED NOT DETECTED  MRSA Next Gen by PCR, Nasal   Specimen: Nasal Mucosa; Nasal Swab  Result Value Ref Range   MRSA by PCR Next Gen NOT DETECTED NOT DETECTED  Resp Panel by RT-PCR (Flu A&B, Covid) Nasopharyngeal Swab   Specimen: Nasopharyngeal Swab; Nasopharyngeal(NP) swabs in vial transport medium  Result Value Ref Range   SARS Coronavirus 2 by RT PCR NEGATIVE NEGATIVE   Influenza A by PCR NEGATIVE NEGATIVE   Influenza B by PCR NEGATIVE NEGATIVE  CBC  Result Value Ref Range   WBC 27.7 (H) 4.0 - 10.5 K/uL   RBC 4.72 3.87 - 5.11 MIL/uL   Hemoglobin 13.7 12.0 - 15.0 g/dL   HCT 41.7 36.0 - 46.0 %   MCV 88.3 80.0 - 100.0 fL   MCH 29.0 26.0 - 34.0 pg   MCHC 32.9 30.0 - 36.0 g/dL   RDW 13.2 11.5 - 15.5 %   Platelets 277 150 - 400 K/uL   nRBC 0.0 0.0 - 0.2 %  Comprehensive metabolic panel  Result Value Ref Range   Sodium 138 135 - 145 mmol/L   Potassium 3.2 (L) 3.5 - 5.1 mmol/L   Chloride 98 98 - 111 mmol/L   CO2 27 22 - 32 mmol/L   Glucose, Bld 256 (H) 70 - 99 mg/dL   BUN 21 8 - 23 mg/dL   Creatinine, Ser 1.41 (H) 0.44 - 1.00 mg/dL   Calcium 8.9 8.9 - 10.3 mg/dL   Total Protein 7.8 6.5 - 8.1 g/dL   Albumin 3.9 3.5 - 5.0 g/dL   AST 37 15 - 41 U/L   ALT 20 0 - 44 U/L   Alkaline Phosphatase 82 38 - 126 U/L   Total Bilirubin 0.9 0.3 - 1.2 mg/dL   GFR, Estimated 36 (L) >60 mL/min   Anion gap 13 5 - 15  Urinalysis, Complete w Microscopic  Result Value Ref Range   Color, Urine YELLOW YELLOW   APPearance CLEAR CLEAR   Specific Gravity, Urine 1.015 1.005 - 1.030   pH 5.0 5.0 - 8.0   Glucose, UA 250 (A)  NEGATIVE mg/dL   Hgb urine dipstick LARGE (A) NEGATIVE   Bilirubin Urine NEGATIVE NEGATIVE   Ketones, ur 15 (A) NEGATIVE mg/dL   Protein, ur >300 (A) NEGATIVE mg/dL   Nitrite POSITIVE (A) NEGATIVE   Leukocytes,Ua MODERATE (A) NEGATIVE   Squamous Epithelial / LPF 0-5 0 - 5   WBC, UA >50 0 - 5 WBC/hpf   RBC / HPF 11-20 0 - 5 RBC/hpf   Bacteria, UA MANY (A) NONE SEEN   Mucus PRESENT    Hyaline Casts, UA PRESENT   CK  Result Value Ref Range   Total CK 370 (H) 38 - 234 U/L  Lactic acid, plasma  Result Value Ref Range   Lactic Acid, Venous 3.1 (HH) 0.5 - 1.9 mmol/L  Lactic acid, plasma  Result Value Ref Range   Lactic Acid, Venous 3.1 (HH) 0.5 - 1.9 mmol/L  Protime-INR  Result Value Ref Range   Prothrombin Time 18.2 (H) 11.4 - 15.2 seconds   INR 1.5 (H) 0.8 - 1.2  APTT  Result Value Ref Range   aPTT 31 24 - 36 seconds  Magnesium  Result Value Ref Range   Magnesium 1.7 1.7 - 2.4 mg/dL  Basic metabolic panel  Result Value Ref Range   Sodium 139 135 - 145 mmol/L   Potassium 3.4 (L) 3.5 - 5.1 mmol/L   Chloride 108 98 - 111 mmol/L   CO2 22 22 - 32 mmol/L   Glucose, Bld 127 (H) 70 - 99 mg/dL   BUN 23 8 - 23 mg/dL   Creatinine, Ser 1.10 (H) 0.44 - 1.00 mg/dL   Calcium 7.6 (L) 8.9 - 10.3 mg/dL   GFR, Estimated 49 (L) >60 mL/min   Anion gap 9 5 - 15  CBC  Result Value Ref Range   WBC 23.5 (H) 4.0 - 10.5 K/uL   RBC 3.65 (L) 3.87 - 5.11 MIL/uL   Hemoglobin 10.8 (L) 12.0 - 15.0 g/dL   HCT 33.1 (L) 36.0 - 46.0 %   MCV 90.7 80.0 - 100.0 fL   MCH 29.6 26.0 - 34.0 pg   MCHC 32.6 30.0 - 36.0 g/dL   RDW 13.6 11.5 - 15.5 %   Platelets 206 150 - 400 K/uL   nRBC 0.0 0.0 - 0.2 %  Glucose, capillary  Result Value Ref Range   Glucose-Capillary 103 (H) 70 - 99 mg/dL  Glucose, capillary  Result Value Ref Range   Glucose-Capillary 122 (H) 70 - 99 mg/dL  Magnesium  Result Value Ref Range   Magnesium 1.6 (L) 1.7 - 2.4 mg/dL  Lactic acid, plasma  Result Value Ref Range   Lactic Acid,  Venous 2.2 (HH) 0.5 - 1.9 mmol/L  Glucose, capillary  Result Value Ref Range   Glucose-Capillary 216 (H) 70 - 99 mg/dL  Glucose, capillary  Result Value Ref Range   Glucose-Capillary 139 (H) 70 - 99 mg/dL  Basic metabolic panel  Result Value Ref Range   Sodium 138 135 - 145 mmol/L   Potassium 3.7 3.5 - 5.1 mmol/L   Chloride 106 98 - 111 mmol/L   CO2 25 22 - 32 mmol/L   Glucose, Bld 162 (H) 70 - 99 mg/dL   BUN 18 8 - 23 mg/dL   Creatinine, Ser 0.74 0.44 - 1.00 mg/dL   Calcium 8.3 (L) 8.9 - 10.3 mg/dL   GFR, Estimated >60 >60 mL/min   Anion gap 7 5 - 15  CBC  Result Value Ref Range   WBC 17.3 (H) 4.0 - 10.5 K/uL   RBC 4.00 3.87 - 5.11 MIL/uL   Hemoglobin 11.6 (L) 12.0 - 15.0 g/dL   HCT 35.1 (L) 36.0 - 46.0 %   MCV 87.8 80.0 - 100.0 fL   MCH 29.0 26.0 - 34.0 pg   MCHC 33.0 30.0 - 36.0 g/dL   RDW 13.4 11.5 - 15.5 %   Platelets 228 150 - 400 K/uL   nRBC 0.0 0.0 - 0.2 %  Lactic acid, plasma  Result Value Ref Range   Lactic Acid, Venous 1.1 0.5 - 1.9 mmol/L  Magnesium  Result Value Ref Range   Magnesium 2.1 1.7 - 2.4 mg/dL  Glucose, capillary  Result Value Ref Range   Glucose-Capillary 220 (H) 70 - 99 mg/dL  Glucose, capillary  Result Value Ref Range   Glucose-Capillary 170 (H) 70 - 99 mg/dL  Glucose, capillary  Result Value Ref Range   Glucose-Capillary 204 (H) 70 - 99 mg/dL  Glucose, capillary  Result Value Ref Range   Glucose-Capillary 174 (H) 70 - 99 mg/dL  Basic metabolic panel  Result Value Ref Range   Sodium 138 135 - 145 mmol/L   Potassium 4.2 3.5 - 5.1 mmol/L   Chloride 106 98 - 111 mmol/L   CO2 24 22 - 32 mmol/L   Glucose, Bld 243 (H) 70 - 99 mg/dL   BUN 15 8 - 23 mg/dL   Creatinine, Ser 0.83 0.44 - 1.00 mg/dL   Calcium 8.7 (L) 8.9 - 10.3 mg/dL   GFR, Estimated >60 >60 mL/min   Anion gap 8 5 - 15  Magnesium  Result Value Ref Range   Magnesium 2.2 1.7 - 2.4 mg/dL  Glucose, capillary  Result Value Ref Range   Glucose-Capillary 265 (H) 70 - 99 mg/dL   Glucose, capillary  Result Value Ref Range   Glucose-Capillary 193 (H) 70 - 99 mg/dL  Glucose, capillary  Result Value Ref Range   Glucose-Capillary 207 (H) 70 - 99 mg/dL  Glucose, capillary  Result Value Ref Range   Glucose-Capillary 193 (H) 70 - 99 mg/dL  Basic metabolic panel  Result Value Ref Range   Sodium 140 135 - 145 mmol/L   Potassium 4.5 3.5 - 5.1 mmol/L   Chloride 107 98 - 111 mmol/L   CO2 25 22 - 32 mmol/L   Glucose, Bld 240 (H) 70 - 99 mg/dL   BUN 23 8 -  23 mg/dL   Creatinine, Ser 0.83 0.44 - 1.00 mg/dL   Calcium 8.9 8.9 - 10.3 mg/dL   GFR, Estimated >60 >60 mL/min   Anion gap 8 5 - 15  Glucose, capillary  Result Value Ref Range   Glucose-Capillary 176 (H) 70 - 99 mg/dL  Glucose, capillary  Result Value Ref Range   Glucose-Capillary 181 (H) 70 - 99 mg/dL  Glucose, capillary  Result Value Ref Range   Glucose-Capillary 368 (H) 70 - 99 mg/dL  Glucose, capillary  Result Value Ref Range   Glucose-Capillary 158 (H) 70 - 99 mg/dL  Basic metabolic panel  Result Value Ref Range   Sodium 140 135 - 145 mmol/L   Potassium 4.1 3.5 - 5.1 mmol/L   Chloride 108 98 - 111 mmol/L   CO2 25 22 - 32 mmol/L   Glucose, Bld 203 (H) 70 - 99 mg/dL   BUN 23 8 - 23 mg/dL   Creatinine, Ser 0.66 0.44 - 1.00 mg/dL   Calcium 8.8 (L) 8.9 - 10.3 mg/dL   GFR, Estimated >60 >60 mL/min   Anion gap 7 5 - 15  Glucose, capillary  Result Value Ref Range   Glucose-Capillary 180 (H) 70 - 99 mg/dL  Glucose, capillary  Result Value Ref Range   Glucose-Capillary 174 (H) 70 - 99 mg/dL  CBG monitoring, ED  Result Value Ref Range   Glucose-Capillary 251 (H) 70 - 99 mg/dL  Troponin I (High Sensitivity)  Result Value Ref Range   Troponin I (High Sensitivity) 166 (HH) <18 ng/L  Troponin I (High Sensitivity)  Result Value Ref Range   Troponin I (High Sensitivity) 129 (HH) <18 ng/L      Assessment & Plan:   Problem List Items Addressed This Visit       Genitourinary   Acute cystitis     Suspect UTI has returned with dehydration.  Given HR and blood pressure in office discussed with daughter, Benjamine Mola, and patient that I would have a low threshold to take patient back to the ER.  Discussed that she should monitor her HR and blood pressure at home every 2-3 hours and if blood pressure is less than 90/50 and HR is >130 she should be seen in the ER.  Discussed importance of drinking 1/2 cup of water every hour to improve hydration.  If light headedness, fatigue or feeling faint worsen she should be seen in the ER. CMP, CBC and urine culture sent today.        Relevant Orders   Urine Culture     Other   Sepsis ([place])    Reviewed lab work with patient from hospitalization.  Discussed low threshold to take patient back to ER.  Labs ordered today.  Discussed that based on lab work it may be recommended that she return to the ER for further evaluation and management.  Keflex restarted QID x 7 days.  Will await culture.      Other Visit Diagnoses     Hospital discharge follow-up    -  Primary   Discharged on 01/30/2022. Completed Keflex on 02/02/2022. Symptoms restarted 24 hours ago.  CMP, CBC and UA/UC obtained in office. Restarted Keflex.   Feeling faint       Relevant Orders   Comp Met (CMET)   CBC w/Diff   Urinalysis, Routine w reflex microscopic        Follow up plan: Return if symptoms worsen or fail to improve.

## 2022-02-07 NOTE — Assessment & Plan Note (Signed)
Reviewed lab work with patient from hospitalization.  Discussed low threshold to take patient back to ER.  Labs ordered today.  Discussed that based on lab work it may be recommended that she return to the ER for further evaluation and management.  Keflex restarted QID x 7 days.  Will await culture.

## 2022-02-07 NOTE — Telephone Encounter (Signed)
Called and spoke with patient's daughter.  Appointment scheduled with PCP today, daughter will bring patient to appointment.

## 2022-02-07 NOTE — Patient Instructions (Signed)
Blood pressure <90/50 HR >130  Should be seen in the ER

## 2022-02-07 NOTE — Assessment & Plan Note (Signed)
Suspect UTI has returned with dehydration.  Given HR and blood pressure in office discussed with daughter, Benjamine Mola, and patient that I would have a low threshold to take patient back to the ER.  Discussed that she should monitor her HR and blood pressure at home every 2-3 hours and if blood pressure is less than 90/50 and HR is >130 she should be seen in the ER.  Discussed importance of drinking 1/2 cup of water every hour to improve hydration.  If light headedness, fatigue or feeling faint worsen she should be seen in the ER. CMP, CBC and urine culture sent today.

## 2022-02-08 ENCOUNTER — Inpatient Hospital Stay: Payer: Medicare PPO | Admitting: Family Medicine

## 2022-02-08 LAB — CBC WITH DIFFERENTIAL/PLATELET
Basophils Absolute: 0.1 10*3/uL (ref 0.0–0.2)
Basos: 1 %
EOS (ABSOLUTE): 0 10*3/uL (ref 0.0–0.4)
Eos: 0 %
Hematocrit: 45 % (ref 34.0–46.6)
Hemoglobin: 15.1 g/dL (ref 11.1–15.9)
Immature Grans (Abs): 0 10*3/uL (ref 0.0–0.1)
Immature Granulocytes: 0 %
Lymphocytes Absolute: 2.1 10*3/uL (ref 0.7–3.1)
Lymphs: 19 %
MCH: 29 pg (ref 26.6–33.0)
MCHC: 33.6 g/dL (ref 31.5–35.7)
MCV: 87 fL (ref 79–97)
Monocytes Absolute: 0.8 10*3/uL (ref 0.1–0.9)
Monocytes: 7 %
Neutrophils Absolute: 8.3 10*3/uL — ABNORMAL HIGH (ref 1.4–7.0)
Neutrophils: 73 %
Platelets: 518 10*3/uL — ABNORMAL HIGH (ref 150–450)
RBC: 5.2 x10E6/uL (ref 3.77–5.28)
RDW: 13.3 % (ref 11.7–15.4)
WBC: 11.4 10*3/uL — ABNORMAL HIGH (ref 3.4–10.8)

## 2022-02-08 LAB — COMPREHENSIVE METABOLIC PANEL
ALT: 27 IU/L (ref 0–32)
AST: 24 IU/L (ref 0–40)
Albumin/Globulin Ratio: 1.5 (ref 1.2–2.2)
Albumin: 4.3 g/dL (ref 3.6–4.6)
Alkaline Phosphatase: 115 IU/L (ref 44–121)
BUN/Creatinine Ratio: 25 (ref 12–28)
BUN: 30 mg/dL — ABNORMAL HIGH (ref 8–27)
Bilirubin Total: 0.4 mg/dL (ref 0.0–1.2)
CO2: 17 mmol/L — ABNORMAL LOW (ref 20–29)
Calcium: 9.3 mg/dL (ref 8.7–10.3)
Chloride: 101 mmol/L (ref 96–106)
Creatinine, Ser: 1.18 mg/dL — ABNORMAL HIGH (ref 0.57–1.00)
Globulin, Total: 2.8 g/dL (ref 1.5–4.5)
Glucose: 175 mg/dL — ABNORMAL HIGH (ref 70–99)
Potassium: 4.4 mmol/L (ref 3.5–5.2)
Sodium: 141 mmol/L (ref 134–144)
Total Protein: 7.1 g/dL (ref 6.0–8.5)
eGFR: 45 mL/min/{1.73_m2} — ABNORMAL LOW (ref >59)

## 2022-02-08 NOTE — Progress Notes (Signed)
Spoke with patient's daughter [name] to discuss results.  Patient's kidney function declined some but not as bad as when she went to the ER.  This is indicating that she is dehydrated as we discussed during the visit. Her white blood cell count bumped back up too.    At this point, I want her to drink the half cup per hour and start the Keflex and I would like to see her back tomorrow.  If she feels like patient is worse than yesterday she should take her to the ER.

## 2022-02-09 ENCOUNTER — Ambulatory Visit: Payer: Medicare PPO | Admitting: Nurse Practitioner

## 2022-02-09 ENCOUNTER — Other Ambulatory Visit: Payer: Self-pay

## 2022-02-09 ENCOUNTER — Encounter: Payer: Self-pay | Admitting: Nurse Practitioner

## 2022-02-09 ENCOUNTER — Telehealth: Payer: Self-pay | Admitting: Nurse Practitioner

## 2022-02-09 VITALS — BP 117/68 | HR 67 | Temp 98.3°F | Wt 168.2 lb

## 2022-02-09 DIAGNOSIS — N3001 Acute cystitis with hematuria: Secondary | ICD-10-CM | POA: Diagnosis not present

## 2022-02-09 DIAGNOSIS — A419 Sepsis, unspecified organism: Secondary | ICD-10-CM

## 2022-02-09 LAB — URINALYSIS, ROUTINE W REFLEX MICROSCOPIC
Bilirubin, UA: NEGATIVE
Glucose, UA: NEGATIVE
Ketones, UA: NEGATIVE
Leukocytes,UA: NEGATIVE
Nitrite, UA: NEGATIVE
Protein,UA: NEGATIVE
RBC, UA: NEGATIVE
Specific Gravity, UA: 1.015 (ref 1.005–1.030)
Urobilinogen, Ur: 0.2 mg/dL (ref 0.2–1.0)
pH, UA: 6 (ref 5.0–7.5)

## 2022-02-09 NOTE — Telephone Encounter (Signed)
Gracie CMA from [facility] [address] Marchelle Gearing cardiologist office received a call from patient stating PCP advised patient to call specialist and schedule an emergency appointment because of a heart concern. Caller would like a follow up call.

## 2022-02-09 NOTE — Assessment & Plan Note (Signed)
Continue with Keflex.  Follow up in 1 week.  Will repeat UA at that visit.

## 2022-02-09 NOTE — Progress Notes (Signed)
Please let patient know that her urinalysis from today looked great.

## 2022-02-09 NOTE — Assessment & Plan Note (Signed)
Improving. Lab work from 2 days ago was reassuring.  Patient is warm to the touch and has better color on exam today.  Continue with the Keflex. Will recheck CMP, CBC and UA in office today.  Will make further recommendations based on lab results.

## 2022-02-09 NOTE — Telephone Encounter (Signed)
[place] spoke with them further

## 2022-02-09 NOTE — Progress Notes (Signed)
BP 117/68    Pulse 67    Temp 98.3 F (36.8 C) (Oral)    Wt 168 lb 3.2 oz (76.3 kg)    SpO2 95%    BMI 27.15 kg/m    Subjective:    Patient ID: Alison Richardson, female    DOB: Feb 20, 1933, 88 y.o.   MRN: [id number]  HPI: Alison Richardson is a 88 y.o. female  Chief Complaint  Patient presents with   Urinary Tract Infection    Patient presents to clinic today with complaints of fatigue and decreased strength.  She is feeling better.  Yesterday was better than today.  She is still increasing her fluid intake and taking the Keflex that was given two days ago.      Relevant past medical, surgical, family and social history reviewed and updated as indicated. Interim medical history since our last visit reviewed. Allergies and medications reviewed and updated.  Review of Systems  Constitutional:  Positive for fatigue.  Musculoskeletal:        Decreased strength   Per HPI unless specifically indicated above     Objective:    BP 117/68    Pulse 67    Temp 98.3 F (36.8 C) (Oral)    Wt 168 lb 3.2 oz (76.3 kg)    SpO2 95%    BMI 27.15 kg/m   Wt Readings from Last 3 Encounters:  02/09/22 168 lb 3.2 oz (76.3 kg)  02/07/22 169 lb 3.2 oz (76.7 kg)  01/25/22 178 lb (80.7 kg)    Physical Exam Vitals and nursing note reviewed.  Constitutional:      General: She is not in acute distress.    Appearance: Normal appearance. She is normal weight. She is not ill-appearing, toxic-appearing or diaphoretic.  HENT:     Head: Normocephalic.     Right Ear: External ear normal.     Left Ear: External ear normal.     Nose: Nose normal.     Mouth/Throat:     Mouth: Mucous membranes are moist.     Pharynx: Oropharynx is clear.  Eyes:     General:        Right eye: No discharge.        Left eye: No discharge.     Extraocular Movements: Extraocular movements intact.     Conjunctiva/sclera: Conjunctivae normal.     Pupils: Pupils are equal, round, and reactive to light.  Cardiovascular:      Rate and Rhythm: Normal rate and regular rhythm.     Heart sounds: No murmur heard. Pulmonary:     Effort: Pulmonary effort is normal. No respiratory distress.     Breath sounds: Normal breath sounds. No wheezing or rales.  Musculoskeletal:     Cervical back: Normal range of motion and neck supple.  Skin:    General: Skin is warm and dry.     Capillary Refill: Capillary refill takes less than 2 seconds.  Neurological:     General: No focal deficit present.     Mental Status: She is alert and oriented to person, place, and time. Mental status is at baseline.  Psychiatric:        Mood and Affect: Mood normal.        Behavior: Behavior normal.        Thought Content: Thought content normal.        Judgment: Judgment normal.    Results for orders placed or performed in visit on 02/07/22  Microscopic Examination  Urine  Result Value Ref Range   WBC, UA 0-5 0 - 5 /hpf   RBC None seen 0 - 2 /hpf   Epithelial Cells (non renal) 0-10 0 - 10 /hpf   Renal Epithel, UA 0-10 (A) None seen /hpf   Casts Present (A) None seen /lpf   Cast Type Hyaline casts N/A   Mucus, UA Present (A) Not Estab.   Bacteria, UA None seen None seen/Few  Comp Met (CMET)  Result Value Ref Range   Glucose 175 (H) 70 - 99 mg/dL   BUN 30 (H) 8 - 27 mg/dL   Creatinine, Ser 1.18 (H) 0.57 - 1.00 mg/dL   eGFR 45 (L) >59 mL/min/1.73   BUN/Creatinine Ratio 25 12 - 28   Sodium 141 134 - 144 mmol/L   Potassium 4.4 3.5 - 5.2 mmol/L   Chloride 101 96 - 106 mmol/L   CO2 17 (L) 20 - 29 mmol/L   Calcium 9.3 8.7 - 10.3 mg/dL   Total Protein 7.1 6.0 - 8.5 g/dL   Albumin 4.3 3.6 - 4.6 g/dL   Globulin, Total 2.8 1.5 - 4.5 g/dL   Albumin/Globulin Ratio 1.5 1.2 - 2.2   Bilirubin Total 0.4 0.0 - 1.2 mg/dL   Alkaline Phosphatase 115 44 - 121 IU/L   AST 24 0 - 40 IU/L   ALT 27 0 - 32 IU/L  CBC w/Diff  Result Value Ref Range   WBC 11.4 (H) 3.4 - 10.8 x10E3/uL   RBC 5.20 3.77 - 5.28 x10E6/uL   Hemoglobin 15.1 11.1 - 15.9 g/dL    Hematocrit 45.0 34.0 - 46.6 %   MCV 87 79 - 97 fL   MCH 29.0 26.6 - 33.0 pg   MCHC 33.6 31.5 - 35.7 g/dL   RDW 13.3 11.7 - 15.4 %   Platelets 518 (H) 150 - 450 x10E3/uL   Neutrophils 73 Not Estab. %   Lymphs 19 Not Estab. %   Monocytes 7 Not Estab. %   Eos 0 Not Estab. %   Basos 1 Not Estab. %   Neutrophils Absolute 8.3 (H) 1.4 - 7.0 x10E3/uL   Lymphocytes Absolute 2.1 0.7 - 3.1 x10E3/uL   Monocytes Absolute 0.8 0.1 - 0.9 x10E3/uL   EOS (ABSOLUTE) 0.0 0.0 - 0.4 x10E3/uL   Basophils Absolute 0.1 0.0 - 0.2 x10E3/uL   Immature Granulocytes 0 Not Estab. %   Immature Grans (Abs) 0.0 0.0 - 0.1 x10E3/uL  Urinalysis, Routine w reflex microscopic  Result Value Ref Range   Specific Gravity, UA 1.015 1.005 - 1.030   pH, UA 6.5 5.0 - 7.5   Color, UA Yellow Yellow   Appearance Ur Cloudy (A) Clear   Leukocytes,UA Trace (A) Negative   Protein,UA Negative Negative/Trace   Glucose, UA Negative Negative   Ketones, UA Negative Negative   RBC, UA Negative Negative   Bilirubin, UA Negative Negative   Urobilinogen, Ur 0.2 0.2 - 1.0 mg/dL   Nitrite, UA Negative Negative   Microscopic Examination See below:       Assessment & Plan:   Problem List Items Addressed This Visit       Genitourinary   Acute cystitis    Continue with Keflex.  Follow up in 1 week.  Will repeat UA at that visit.      Relevant Orders   Comp Met (CMET)   CBC w/Diff   Urinalysis, Routine w reflex microscopic     Other   Sepsis ([place]) - Primary  Improving. Lab work from 2 days ago was reassuring.  Patient is warm to the touch and has better color on exam today.  Continue with the Keflex. Will recheck CMP, CBC and UA in office today.  Will make further recommendations based on lab results.       Relevant Orders   Comp Met (CMET)   CBC w/Diff   Urinalysis, Routine w reflex microscopic     Follow up plan: Return in about 1 week (around 02/16/2022) for recheck blood work.

## 2022-02-10 LAB — URINE CULTURE

## 2022-02-10 LAB — COMPREHENSIVE METABOLIC PANEL
ALT: 31 IU/L (ref 0–32)
AST: 25 IU/L (ref 0–40)
Albumin/Globulin Ratio: 1.7 (ref 1.2–2.2)
Albumin: 4.3 g/dL (ref 3.6–4.6)
Alkaline Phosphatase: 117 IU/L (ref 44–121)
BUN/Creatinine Ratio: 26 (ref 12–28)
BUN: 29 mg/dL — ABNORMAL HIGH (ref 8–27)
Bilirubin Total: 0.3 mg/dL (ref 0.0–1.2)
CO2: 21 mmol/L (ref 20–29)
Calcium: 9 mg/dL (ref 8.7–10.3)
Chloride: 101 mmol/L (ref 96–106)
Creatinine, Ser: 1.11 mg/dL — ABNORMAL HIGH (ref 0.57–1.00)
Globulin, Total: 2.6 g/dL (ref 1.5–4.5)
Glucose: 162 mg/dL — ABNORMAL HIGH (ref 70–99)
Potassium: 3.8 mmol/L (ref 3.5–5.2)
Sodium: 139 mmol/L (ref 134–144)
Total Protein: 6.9 g/dL (ref 6.0–8.5)
eGFR: 48 mL/min/{1.73_m2} — ABNORMAL LOW (ref >59)

## 2022-02-10 LAB — CBC WITH DIFFERENTIAL/PLATELET
Basophils Absolute: 0.1 10*3/uL (ref 0.0–0.2)
Basos: 1 %
EOS (ABSOLUTE): 0.1 10*3/uL (ref 0.0–0.4)
Eos: 1 %
Hematocrit: 44 % (ref 34.0–46.6)
Hemoglobin: 15.1 g/dL (ref 11.1–15.9)
Immature Grans (Abs): 0.1 10*3/uL (ref 0.0–0.1)
Immature Granulocytes: 1 %
Lymphocytes Absolute: 2.8 10*3/uL (ref 0.7–3.1)
Lymphs: 22 %
MCH: 29.8 pg (ref 26.6–33.0)
MCHC: 34.3 g/dL (ref 31.5–35.7)
MCV: 87 fL (ref 79–97)
Monocytes Absolute: 1 10*3/uL — ABNORMAL HIGH (ref 0.1–0.9)
Monocytes: 8 %
Neutrophils Absolute: 8.8 10*3/uL — ABNORMAL HIGH (ref 1.4–7.0)
Neutrophils: 67 %
Platelets: 503 10*3/uL — ABNORMAL HIGH (ref 150–450)
RBC: 5.07 x10E6/uL (ref 3.77–5.28)
RDW: 13.5 % (ref 11.7–15.4)
WBC: 12.9 10*3/uL — ABNORMAL HIGH (ref 3.4–10.8)

## 2022-02-12 ENCOUNTER — Ambulatory Visit: Payer: Medicare PPO | Admitting: Nurse Practitioner

## 2022-02-12 DIAGNOSIS — K219 Gastro-esophageal reflux disease without esophagitis: Secondary | ICD-10-CM | POA: Insufficient documentation

## 2022-02-12 NOTE — Progress Notes (Signed)
Please let patient know that there was no bacteria in her urine.

## 2022-02-12 NOTE — Progress Notes (Signed)
Please let patient know that her kidney function improved some.  She needs to continue to increase her fluid intake.  Her white blood cell count did increased some.  Her urine culture didn't grow any bacteria which is reassuring.  The elevated white count could be due to stress and dehydration. We will recheck this on Friday.  Complete the course of antibiotics.  I will see you this week.

## 2022-02-12 NOTE — Progress Notes (Signed)
Patient should reschedule her injection.

## 2022-02-16 ENCOUNTER — Encounter: Payer: Self-pay | Admitting: Nurse Practitioner

## 2022-02-16 ENCOUNTER — Other Ambulatory Visit: Payer: Self-pay

## 2022-02-16 ENCOUNTER — Ambulatory Visit: Payer: Medicare PPO | Admitting: Nurse Practitioner

## 2022-02-16 VITALS — BP 95/66 | HR 108 | Temp 98.1°F | Wt 171.4 lb

## 2022-02-16 DIAGNOSIS — N3001 Acute cystitis with hematuria: Secondary | ICD-10-CM | POA: Diagnosis not present

## 2022-02-16 DIAGNOSIS — I10 Essential (primary) hypertension: Secondary | ICD-10-CM

## 2022-02-16 DIAGNOSIS — I5032 Chronic diastolic (congestive) heart failure: Secondary | ICD-10-CM

## 2022-02-16 DIAGNOSIS — R42 Dizziness and giddiness: Secondary | ICD-10-CM

## 2022-02-16 DIAGNOSIS — E46 Unspecified protein-calorie malnutrition: Secondary | ICD-10-CM

## 2022-02-16 LAB — MICROSCOPIC EXAMINATION
Epithelial Cells (non renal): NONE SEEN /hpf (ref 0–10)
WBC, UA: NONE SEEN /hpf (ref 0–5)

## 2022-02-16 LAB — URINALYSIS, ROUTINE W REFLEX MICROSCOPIC
Bilirubin, UA: NEGATIVE
Glucose, UA: NEGATIVE
Ketones, UA: NEGATIVE
Leukocytes,UA: NEGATIVE
Nitrite, UA: NEGATIVE
Protein,UA: NEGATIVE
Specific Gravity, UA: 1.015 (ref 1.005–1.030)
Urobilinogen, Ur: 0.2 mg/dL (ref 0.2–1.0)
pH, UA: 6.5 (ref 5.0–7.5)

## 2022-02-16 NOTE — Assessment & Plan Note (Signed)
Chronic. Blood pressure log from home running 120/130/80s. HR is still elevated above 100. Continue with current medication regimen. Continue with increased water in take. Will repeat labs at visit today. Will make recommendations based on lab results.

## 2022-02-16 NOTE — Assessment & Plan Note (Signed)
Will repeat urinalysis and urine culture. Patient completed course of Keflex. May need to change antibiotic.  Her WBC was still elevated at last visit.  Will make recommendations based on lab results.

## 2022-02-16 NOTE — Assessment & Plan Note (Signed)
Recommend patient drink boost or ensure to help with protein-calorie deficit.  Discussed with patient during visit. Will continue to monitor at next visit.

## 2022-02-16 NOTE — Assessment & Plan Note (Signed)
Followed up with Cardiology. Would like her to continue her current medication regimen. Recommended she follow up in 2 months.

## 2022-02-16 NOTE — Progress Notes (Signed)
BP 95/66    Pulse (!) 108    Temp 98.1 F (36.7 C) (Oral)    Wt 171 lb 6.4 oz (77.7 kg)    SpO2 97%    BMI 27.66 kg/m    Subjective:    Patient ID: Alison Richardson, female    DOB: [birth year], 88 y.o.   MRN: [id number]  HPI: Alison Richardson is a 88 y.o. female  Chief Complaint  Patient presents with   Fatigue    Patient presents to clinic today to follow up on fatigue, faintness, decreased strength since hospitalization. She feels like she is starting to have more good days than bad days.  She is still drinking plenty of water.  She is trying to drink about a 1/2 a cup an hour of water.  She followed up with Cardiology       Relevant past medical, surgical, family and social history reviewed and updated as indicated. Interim medical history since our last visit reviewed. Allergies and medications reviewed and updated.  Review of Systems  Constitutional:  Positive for fatigue.  Musculoskeletal:        Decreased strength   Per HPI unless specifically indicated above     Objective:    BP 95/66    Pulse (!) 108    Temp 98.1 F (36.7 C) (Oral)    Wt 171 lb 6.4 oz (77.7 kg)    SpO2 97%    BMI 27.66 kg/m   Wt Readings from Last 3 Encounters:  02/16/22 171 lb 6.4 oz (77.7 kg)  02/09/22 168 lb 3.2 oz (76.3 kg)  02/07/22 169 lb 3.2 oz (76.7 kg)    Physical Exam Vitals and nursing note reviewed.  Constitutional:      General: She is not in acute distress.    Appearance: Normal appearance. She is normal weight. She is not ill-appearing, toxic-appearing or diaphoretic.  HENT:     Head: Normocephalic.     Right Ear: External ear normal.     Left Ear: External ear normal.     Nose: Nose normal.     Mouth/Throat:     Mouth: Mucous membranes are moist.     Pharynx: Oropharynx is clear.  Eyes:     General:        Right eye: No discharge.        Left eye: No discharge.     Extraocular Movements: Extraocular movements intact.     Conjunctiva/sclera: Conjunctivae normal.      Pupils: Pupils are equal, round, and reactive to light.  Cardiovascular:     Rate and Rhythm: Normal rate and regular rhythm.     Heart sounds: No murmur heard. Pulmonary:     Effort: Pulmonary effort is normal. No respiratory distress.     Breath sounds: Normal breath sounds. No wheezing or rales.  Musculoskeletal:     Cervical back: Normal range of motion and neck supple.  Skin:    General: Skin is warm and dry.     Capillary Refill: Capillary refill takes less than 2 seconds.  Neurological:     General: No focal deficit present.     Mental Status: She is alert and oriented to person, place, and time. Mental status is at baseline.  Psychiatric:        Mood and Affect: Mood normal.        Behavior: Behavior normal.        Thought Content: Thought content normal.  Judgment: Judgment normal.    Results for orders placed or performed in visit on 02/09/22  Comp Met (CMET)  Result Value Ref Range   Glucose 162 (H) 70 - 99 mg/dL   BUN 29 (H) 8 - 27 mg/dL   Creatinine, Ser 1.11 (H) 0.57 - 1.00 mg/dL   eGFR 48 (L) >59 mL/min/1.73   BUN/Creatinine Ratio 26 12 - 28   Sodium 139 134 - 144 mmol/L   Potassium 3.8 3.5 - 5.2 mmol/L   Chloride 101 96 - 106 mmol/L   CO2 21 20 - 29 mmol/L   Calcium 9.0 8.7 - 10.3 mg/dL   Total Protein 6.9 6.0 - 8.5 g/dL   Albumin 4.3 3.6 - 4.6 g/dL   Globulin, Total 2.6 1.5 - 4.5 g/dL   Albumin/Globulin Ratio 1.7 1.2 - 2.2   Bilirubin Total 0.3 0.0 - 1.2 mg/dL   Alkaline Phosphatase 117 44 - 121 IU/L   AST 25 0 - 40 IU/L   ALT 31 0 - 32 IU/L  CBC w/Diff  Result Value Ref Range   WBC 12.9 (H) 3.4 - 10.8 x10E3/uL   RBC 5.07 3.77 - 5.28 x10E6/uL   Hemoglobin 15.1 11.1 - 15.9 g/dL   Hematocrit 44.0 34.0 - 46.6 %   MCV 87 79 - 97 fL   MCH 29.8 26.6 - 33.0 pg   MCHC 34.3 31.5 - 35.7 g/dL   RDW 13.5 11.7 - 15.4 %   Platelets 503 (H) 150 - 450 x10E3/uL   Neutrophils 67 Not Estab. %   Lymphs 22 Not Estab. %   Monocytes 8 Not Estab. %   Eos 1 Not  Estab. %   Basos 1 Not Estab. %   Neutrophils Absolute 8.8 (H) 1.4 - 7.0 x10E3/uL   Lymphocytes Absolute 2.8 0.7 - 3.1 x10E3/uL   Monocytes Absolute 1.0 (H) 0.1 - 0.9 x10E3/uL   EOS (ABSOLUTE) 0.1 0.0 - 0.4 x10E3/uL   Basophils Absolute 0.1 0.0 - 0.2 x10E3/uL   Immature Granulocytes 1 Not Estab. %   Immature Grans (Abs) 0.1 0.0 - 0.1 x10E3/uL  Urinalysis, Routine w reflex microscopic  Result Value Ref Range   Specific Gravity, UA 1.015 1.005 - 1.030   pH, UA 6.0 5.0 - 7.5   Color, UA Yellow Yellow   Appearance Ur Clear Clear   Leukocytes,UA Negative Negative   Protein,UA Negative Negative/Trace   Glucose, UA Negative Negative   Ketones, UA Negative Negative   RBC, UA Negative Negative   Bilirubin, UA Negative Negative   Urobilinogen, Ur 0.2 0.2 - 1.0 mg/dL   Nitrite, UA Negative Negative      Assessment & Plan:   Problem List Items Addressed This Visit       Cardiovascular and Mediastinum   Benign essential hypertension    Chronic. Blood pressure log from home running 120/130/80s. HR is still elevated above 100. Continue with current medication regimen. Continue with increased water in take. Will repeat labs at visit today. Will make recommendations based on lab results.       Chronic diastolic CHF (congestive heart failure) ([place])    Followed up with Cardiology. Would like her to continue her current medication regimen. Recommended she follow up in 2 months.      Relevant Orders   Urinalysis, Routine w reflex microscopic   Comp Met (CMET)   CBC w/Diff     Genitourinary   Acute cystitis - Primary    Will repeat urinalysis and urine culture. Patient completed  course of Keflex. May need to change antibiotic.  Her WBC was still elevated at last visit.  Will make recommendations based on lab results.       Relevant Orders   Urinalysis, Routine w reflex microscopic   Comp Met (CMET)   CBC w/Diff   Urine Culture     Other   Protein-calorie malnutrition ([place])     Recommend patient drink boost or ensure to help with protein-calorie deficit.  Discussed with patient during visit. Will continue to monitor at next visit.       Other Visit Diagnoses     Feeling faint       Relevant Orders   Urinalysis, Routine w reflex microscopic   Comp Met (CMET)   CBC w/Diff        Follow up plan: Return in about 2 weeks (around 03/02/2022) for HTN, HLD, DM2 FU.

## 2022-02-16 NOTE — Progress Notes (Signed)
Urine sent for culture

## 2022-02-17 LAB — CBC WITH DIFFERENTIAL/PLATELET
Basophils Absolute: 0.1 10*3/uL (ref 0.0–0.2)
Basos: 1 %
EOS (ABSOLUTE): 0.2 10*3/uL (ref 0.0–0.4)
Eos: 2 %
Hematocrit: 44.3 % (ref 34.0–46.6)
Hemoglobin: 14.7 g/dL (ref 11.1–15.9)
Immature Grans (Abs): 0 10*3/uL (ref 0.0–0.1)
Immature Granulocytes: 0 %
Lymphocytes Absolute: 2.1 10*3/uL (ref 0.7–3.1)
Lymphs: 22 %
MCH: 29 pg (ref 26.6–33.0)
MCHC: 33.2 g/dL (ref 31.5–35.7)
MCV: 87 fL (ref 79–97)
Monocytes Absolute: 0.6 10*3/uL (ref 0.1–0.9)
Monocytes: 7 %
Neutrophils Absolute: 6.3 10*3/uL (ref 1.4–7.0)
Neutrophils: 68 %
Platelets: 418 10*3/uL (ref 150–450)
RBC: 5.07 x10E6/uL (ref 3.77–5.28)
RDW: 14.1 % (ref 11.7–15.4)
WBC: 9.2 10*3/uL (ref 3.4–10.8)

## 2022-02-17 LAB — COMPREHENSIVE METABOLIC PANEL
ALT: 24 IU/L (ref 0–32)
AST: 19 IU/L (ref 0–40)
Albumin/Globulin Ratio: 1.9 (ref 1.2–2.2)
Albumin: 4.4 g/dL (ref 3.6–4.6)
Alkaline Phosphatase: 112 IU/L (ref 44–121)
BUN/Creatinine Ratio: 22 (ref 12–28)
BUN: 21 mg/dL (ref 8–27)
Bilirubin Total: 0.3 mg/dL (ref 0.0–1.2)
CO2: 21 mmol/L (ref 20–29)
Calcium: 9.2 mg/dL (ref 8.7–10.3)
Chloride: 100 mmol/L (ref 96–106)
Creatinine, Ser: 0.97 mg/dL (ref 0.57–1.00)
Globulin, Total: 2.3 g/dL (ref 1.5–4.5)
Glucose: 190 mg/dL — ABNORMAL HIGH (ref 70–99)
Potassium: 3.8 mmol/L (ref 3.5–5.2)
Sodium: 141 mmol/L (ref 134–144)
Total Protein: 6.7 g/dL (ref 6.0–8.5)
eGFR: 57 mL/min/{1.73_m2} — ABNORMAL LOW (ref >59)

## 2022-02-19 LAB — URINE CULTURE

## 2022-02-19 NOTE — Progress Notes (Signed)
Please let patient know that her lab work is looking great.  Her kidney function has returned to normal.  Her white blood cell count has also returned to normal.  I sent the urine out for culture to ensure nothing grows.  We are headed in the right direction!  Please let me know if she has any questions.

## 2022-02-19 NOTE — Progress Notes (Signed)
Please let patient know there was no growth on her urine culture.

## 2022-03-02 ENCOUNTER — Ambulatory Visit: Payer: Medicare PPO | Admitting: Nurse Practitioner

## 2022-03-02 ENCOUNTER — Encounter: Payer: Self-pay | Admitting: Nurse Practitioner

## 2022-03-02 ENCOUNTER — Other Ambulatory Visit: Payer: Self-pay

## 2022-03-02 VITALS — BP 103/67 | HR 99 | Temp 98.0°F | Wt 177.4 lb

## 2022-03-02 DIAGNOSIS — I10 Essential (primary) hypertension: Secondary | ICD-10-CM

## 2022-03-02 DIAGNOSIS — R635 Abnormal weight gain: Secondary | ICD-10-CM | POA: Insufficient documentation

## 2022-03-02 NOTE — Assessment & Plan Note (Addendum)
Chronic.  Improved but still running low.  Will stop Lisinopril '5mg'$ .  Continue with Imdur '30mg'$ , Amiodarone '200mg'$  and Metoprolol '100mg'$ .  Patient continues to have blood pressures that running 100/60s at home.  HR gets up to 120s with activity.  Discussed with patient that she should speak with Cardiology about medications to help control HR.  Patient encouraged to move appointment up from 4/20.  Follow up in 1 month for reevaluation. ?

## 2022-03-02 NOTE — Assessment & Plan Note (Signed)
Has gained 6lbs in 1 month.  Does not appear to be fluid overloaded in office.  No swelling in lower extremities.  Lungs are clear to Auscultation.  Upon review of weights she was 180lbs prior to hospitalization and lost about 15lbs.  Discussed that she is likely gaining back the weight that she lost.  Discussed diet modifications to help with weight gain. ?

## 2022-03-02 NOTE — Progress Notes (Signed)
? ?BP 103/67   Pulse 99   Temp 98 ?F (36.7 ?C) (Oral)   Wt 177 lb 6.4 oz (80.5 kg)   SpO2 100%   BMI 28.63 kg/m?   ? ?Subjective:  ? ? Patient ID: Alison Richardson, female    DOB: 18-Nov-1933, 88 y.o.   MRN: [id number] ? ?HPI: ?Alison Richardson is a 88 y.o. female ? ?Chief Complaint  ?Patient presents with  ? Hypertension  ? Hyperlipidemia  ? ? ?Patient presents to clinic for follow up today.  The fatigue has improved.  However, she continues to have HR elevation.  She is getting into 120s at about 15 minutes of exercise.  She is continuing to hydrate well.   Denies HA, CP, SOB, dizziness, palpitations, visual changes, and lower extremity swelling. ? ? ? ? ? ?Relevant past medical, surgical, family and social history reviewed and updated as indicated. Interim medical history since our last visit reviewed. ?Allergies and medications reviewed and updated. ? ?Review of Systems  ?Eyes:  Negative for visual disturbance.  ?Respiratory:  Negative for cough, chest tightness and shortness of breath.   ?Cardiovascular:  Negative for chest pain, palpitations and leg swelling.  ?Neurological:  Negative for dizziness and headaches.  ? ?Per HPI unless specifically indicated above ? ?   ?Objective:  ?  ?BP 103/67   Pulse 99   Temp 98 ?F (36.7 ?C) (Oral)   Wt 177 lb 6.4 oz (80.5 kg)   SpO2 100%   BMI 28.63 kg/m?   ?Wt Readings from Last 3 Encounters:  ?03/02/22 177 lb 6.4 oz (80.5 kg)  ?02/16/22 171 lb 6.4 oz (77.7 kg)  ?02/09/22 168 lb 3.2 oz (76.3 kg)  ?  ?Physical Exam ?Vitals and nursing note reviewed.  ?Constitutional:   ?   General: She is not in acute distress. ?   Appearance: Normal appearance. She is normal weight. She is not ill-appearing, toxic-appearing or diaphoretic.  ?HENT:  ?   Head: Normocephalic.  ?   Right Ear: External ear normal.  ?   Left Ear: External ear normal.  ?   Nose: Nose normal.  ?   Mouth/Throat:  ?   Mouth: Mucous membranes are moist.  ?   Pharynx: Oropharynx is clear.  ?Eyes:  ?   General:     ?    Right eye: No discharge.     ?   Left eye: No discharge.  ?   Extraocular Movements: Extraocular movements intact.  ?   Conjunctiva/sclera: Conjunctivae normal.  ?   Pupils: Pupils are equal, round, and reactive to light.  ?Cardiovascular:  ?   Rate and Rhythm: Normal rate and regular rhythm.  ?   Heart sounds: No murmur heard. ?Pulmonary:  ?   Effort: Pulmonary effort is normal. No respiratory distress.  ?   Breath sounds: Normal breath sounds. No wheezing or rales.  ?Musculoskeletal:  ?   Cervical back: Normal range of motion and neck supple.  ?Skin: ?   General: Skin is warm and dry.  ?   Capillary Refill: Capillary refill takes less than 2 seconds.  ?Neurological:  ?   General: No focal deficit present.  ?   Mental Status: She is alert and oriented to person, place, and time. Mental status is at baseline.  ?Psychiatric:     ?   Mood and Affect: Mood normal.     ?   Behavior: Behavior normal.     ?   Thought Content: Thought content  normal.     ?   Judgment: Judgment normal.  ? ? ?Results for orders placed or performed in visit on 02/16/22  ?Urine Culture  ? Specimen: Urine  ? UR  ?Result Value Ref Range  ? Urine Culture, Routine Final report   ? Organism ID, Bacteria Comment   ?Microscopic Examination  ? Urine  ?Result Value Ref Range  ? WBC, UA None seen 0 - 5 /hpf  ? RBC 0-2 0 - 2 /hpf  ? Epithelial Cells (non renal) None seen 0 - 10 /hpf  ? Mucus, UA Present (A) Not Estab.  ? Bacteria, UA Few (A) None seen/Few  ?Urinalysis, Routine w reflex microscopic  ?Result Value Ref Range  ? Specific Gravity, UA 1.015 1.005 - 1.030  ? pH, UA 6.5 5.0 - 7.5  ? Color, UA Yellow Yellow  ? Appearance Ur Clear Clear  ? Leukocytes,UA Negative Negative  ? Protein,UA Negative Negative/Trace  ? Glucose, UA Negative Negative  ? Ketones, UA Negative Negative  ? RBC, UA Trace (A) Negative  ? Bilirubin, UA Negative Negative  ? Urobilinogen, Ur 0.2 0.2 - 1.0 mg/dL  ? Nitrite, UA Negative Negative  ? Microscopic Examination See below:    ?Comp Met (CMET)  ?Result Value Ref Range  ? Glucose 190 (H) 70 - 99 mg/dL  ? BUN 21 8 - 27 mg/dL  ? Creatinine, Ser 0.97 0.57 - 1.00 mg/dL  ? eGFR 57 (L) >59 mL/min/1.73  ? BUN/Creatinine Ratio 22 12 - 28  ? Sodium 141 134 - 144 mmol/L  ? Potassium 3.8 3.5 - 5.2 mmol/L  ? Chloride 100 96 - 106 mmol/L  ? CO2 21 20 - 29 mmol/L  ? Calcium 9.2 8.7 - 10.3 mg/dL  ? Total Protein 6.7 6.0 - 8.5 g/dL  ? Albumin 4.4 3.6 - 4.6 g/dL  ? Globulin, Total 2.3 1.5 - 4.5 g/dL  ? Albumin/Globulin Ratio 1.9 1.2 - 2.2  ? Bilirubin Total 0.3 0.0 - 1.2 mg/dL  ? Alkaline Phosphatase 112 44 - 121 IU/L  ? AST 19 0 - 40 IU/L  ? ALT 24 0 - 32 IU/L  ?CBC w/Diff  ?Result Value Ref Range  ? WBC 9.2 3.4 - 10.8 x10E3/uL  ? RBC 5.07 3.77 - 5.28 x10E6/uL  ? Hemoglobin 14.7 11.1 - 15.9 g/dL  ? Hematocrit 44.3 34.0 - 46.6 %  ? MCV 87 79 - 97 fL  ? MCH 29.0 26.6 - 33.0 pg  ? MCHC 33.2 31.5 - 35.7 g/dL  ? RDW 14.1 11.7 - 15.4 %  ? Platelets 418 150 - 450 x10E3/uL  ? Neutrophils 68 Not Estab. %  ? Lymphs 22 Not Estab. %  ? Monocytes 7 Not Estab. %  ? Eos 2 Not Estab. %  ? Basos 1 Not Estab. %  ? Neutrophils Absolute 6.3 1.4 - 7.0 x10E3/uL  ? Lymphocytes Absolute 2.1 0.7 - 3.1 x10E3/uL  ? Monocytes Absolute 0.6 0.1 - 0.9 x10E3/uL  ? EOS (ABSOLUTE) 0.2 0.0 - 0.4 x10E3/uL  ? Basophils Absolute 0.1 0.0 - 0.2 x10E3/uL  ? Immature Granulocytes 0 Not Estab. %  ? Immature Grans (Abs) 0.0 0.0 - 0.1 x10E3/uL  ? ?   ?Assessment & Plan:  ? ?Problem List Items Addressed This Visit   ? ?  ? Cardiovascular and Mediastinum  ? Benign essential hypertension - Primary  ?  Chronic.  Improved but still running low.  Will stop Lisinopril 46m.  Continue with Imdur 322m Amiodarone 20012mnd  Metoprolol 125m.  Patient continues to have blood pressures that running 100/60s at home.  HR gets up to 120s with activity.  Discussed with patient that she should speak with Cardiology about medications to help control HR.  Patient encouraged to move appointment up from 4/20.  Follow  up in 1 month for reevaluation. ?  ?  ?  ? Other  ? Weight gain  ?  Has gained 6lbs in 1 month.  Does not appear to be fluid overloaded in office.  No swelling in lower extremities.  Lungs are clear to Auscultation.  Upon review of weights she was 180lbs prior to hospitalization and lost about 15lbs.  Discussed that she is likely gaining back the weight that she lost.  Discussed diet modifications to help with weight gain. ?  ?  ?  ? ?Follow up plan: ?Return in about 1 month (around 04/02/2022) for HTN, HLD, DM2 FU. ? ? ?A total of 30 minutes were spent on this encounter today.  When total time is documented, this includes both the face-to-face and non-face-to-face time personally spent before, during and after the visit on the date of the encounter reviewing medications, symptoms, and discussing volume overload vs weight gain.  ? ? ? ? ?

## 2022-03-13 ENCOUNTER — Ambulatory Visit: Payer: Medicare PPO

## 2022-03-14 ENCOUNTER — Other Ambulatory Visit: Payer: Self-pay | Admitting: Nurse Practitioner

## 2022-03-16 NOTE — Telephone Encounter (Signed)
Requested Prescriptions  ?Pending Prescriptions Disp Refills  ?? montelukast (SINGULAIR) 10 MG tablet [Pharmacy Med Name: MONTELUKAST SODIUM 10 MG TAB] 30 tablet 0  ?  Sig: TAKE ONE TABLET BY MOUTH AT BEDTIME  ?  ? Pulmonology:  Leukotriene Inhibitors Passed - 03/14/2022  3:42 PM  ?  ?  Passed - Valid encounter within last 12 months  ?  Recent Outpatient Visits   ?      ? 2 weeks ago Benign essential hypertension  ? [place], NP  ? 4 weeks ago Acute cystitis with hematuria  ? [place], NP  ? 1 month ago Sepsis, due to unspecified organism, unspecified whether acute organ dysfunction present [facility])  ? [place], NP  ? 1 month ago Hospital discharge follow-up  ? [place], NP  ? 2 months ago Paroxysmal atrial fibrillation with rapid ventricular response ([place])  ? [facility] Jon Billings, NP  ?  ?  ?Future Appointments   ?        ? In 4 days  [facility]  ? In 3 weeks Jon Billings, NP [facility], [facility]  ?  ? ?  ?  ?  ? ?

## 2022-03-19 ENCOUNTER — Ambulatory Visit
Admission: RE | Admit: 2022-03-19 | Discharge: 2022-03-19 | Disposition: A | Discharge status: Home / Self Care | Payer: Medicare PPO | Attending: Internal Medicine | Admitting: Internal Medicine

## 2022-03-19 ENCOUNTER — Ambulatory Visit
Admission: RE | Admit: 2022-03-19 | Discharge: 2022-03-19 | Disposition: A | Discharge status: Home / Self Care | Payer: Medicare PPO | Source: Ambulatory Visit | Attending: Internal Medicine | Admitting: Internal Medicine

## 2022-03-19 ENCOUNTER — Encounter: Payer: Self-pay | Admitting: Internal Medicine

## 2022-03-19 ENCOUNTER — Other Ambulatory Visit: Payer: Self-pay

## 2022-03-19 ENCOUNTER — Ambulatory Visit: Payer: Medicare PPO | Admitting: Internal Medicine

## 2022-03-19 VITALS — BP 115/71 | HR 62 | Temp 97.8°F | Ht 65.98 in | Wt 176.0 lb

## 2022-03-19 DIAGNOSIS — J069 Acute upper respiratory infection, unspecified: Secondary | ICD-10-CM

## 2022-03-19 DIAGNOSIS — R309 Painful micturition, unspecified: Secondary | ICD-10-CM | POA: Insufficient documentation

## 2022-03-19 MED ORDER — BENZONATATE 100 MG PO CAPS: 100.0000 mg | ORAL_CAPSULE | Freq: Two times a day (BID) | ORAL | 0 refills | Status: DC | PRN

## 2022-03-19 MED ORDER — FEXOFENADINE HCL 180 MG PO TABS: 180.0000 mg | ORAL_TABLET | Freq: Every day | ORAL | 1 refills | Status: DC

## 2022-03-19 MED ORDER — ALBUTEROL SULFATE HFA 108 (90 BASE) MCG/ACT IN AERS
2.0000 | INHALATION_SPRAY | Freq: Four times a day (QID) | RESPIRATORY_TRACT | 0 refills | Status: DC | PRN
Start: 2022-03-19 — End: 2022-05-16

## 2022-03-19 NOTE — Progress Notes (Signed)
? ?BP 115/71   Pulse 62   Temp 97.8 ?F (36.6 ?C) (Oral)   Ht 5' 5.98" (1.676 m)   Wt 176 lb (79.8 kg)   SpO2 97%   BMI 28.42 kg/m?   ? ?Subjective:  ? ? Patient ID: Alison Richardson, female    DOB: 09/26/1933, 88 y.o.   MRN: [id number] ? ?Chief Complaint  ?Patient presents with  ? Cough  ?  With nasal congestion and wheezingStarted on Friday, OTC Mucinex helps but on for a few hours  ? painful urination  ?  Started about a week ago  ? ? ?HPI: ?Alison Richardson is a 88 y.o. female ? ?Cough ?This is a new problem. The current episode started in the past 7 days. The problem has been waxing and waning. Pertinent negatives include no chills or sweats.  ?Urinary Frequency  ?This is a new problem. The current episode started yesterday. The quality of the pain is described as burning. The pain is at a severity of 5/10. The pain is mild. There has been no fever. Associated symptoms include frequency. Pertinent negatives include no chills, discharge, flank pain, hematuria, hesitancy, nausea, possible pregnancy, sweats, urgency or vomiting. Associated symptoms comments: Has had incontinence which is her baseline. The treatment provided mild relief.  ? ?Chief Complaint  ?Patient presents with  ? Cough  ?  With nasal congestion and wheezingStarted on Friday, OTC Mucinex helps but on for a few hours  ? painful urination  ?  Started about a week ago  ? ? ?Relevant past medical, surgical, family and social history reviewed and updated as indicated. Interim medical history since our last visit reviewed. ?Allergies and medications reviewed and updated. ? ?Review of Systems  ?Constitutional:  Negative for chills.  ?Respiratory:  Positive for cough.   ?Gastrointestinal:  Negative for nausea and vomiting.  ?Genitourinary:  Positive for frequency. Negative for flank pain, hematuria, hesitancy and urgency.  ? ?Per HPI unless specifically indicated above ? ?   ?Objective:  ?  ?BP 115/71   Pulse 62   Temp 97.8 ?F (36.6 ?C) (Oral)   Ht 5'  5.98" (1.676 m)   Wt 176 lb (79.8 kg)   SpO2 97%   BMI 28.42 kg/m?   ?Wt Readings from Last 3 Encounters:  ?03/19/22 176 lb (79.8 kg)  ?03/02/22 177 lb 6.4 oz (80.5 kg)  ?02/16/22 171 lb 6.4 oz (77.7 kg)  ?  ?Physical Exam ?Vitals and nursing note reviewed.  ?Constitutional:   ?   General: She is not in acute distress. ?   Appearance: Normal appearance. She is not ill-appearing or diaphoretic.  ?Eyes:  ?   Conjunctiva/sclera: Conjunctivae normal.  ?Cardiovascular:  ?   Rate and Rhythm: Normal rate.  ?Pulmonary:  ?   Effort: No respiratory distress.  ?   Breath sounds: No stridor. No wheezing or rhonchi.  ?Skin: ?   General: Skin is warm and dry.  ?   Coloration: Skin is not jaundiced.  ?   Findings: No erythema.  ?Neurological:  ?   Mental Status: She is alert.  ? ? ?Results for orders placed or performed in visit on 02/16/22  ?Urine Culture  ? Specimen: Urine  ? UR  ?Result Value Ref Range  ? Urine Culture, Routine Final report   ? Organism ID, Bacteria Comment   ?Microscopic Examination  ? Urine  ?Result Value Ref Range  ? WBC, UA None seen 0 - 5 /hpf  ? RBC 0-2 0 -  2 /hpf  ? Epithelial Cells (non renal) None seen 0 - 10 /hpf  ? Mucus, UA Present (A) Not Estab.  ? Bacteria, UA Few (A) None seen/Few  ?Urinalysis, Routine w reflex microscopic  ?Result Value Ref Range  ? Specific Gravity, UA 1.015 1.005 - 1.030  ? pH, UA 6.5 5.0 - 7.5  ? Color, UA Yellow Yellow  ? Appearance Ur Clear Clear  ? Leukocytes,UA Negative Negative  ? Protein,UA Negative Negative/Trace  ? Glucose, UA Negative Negative  ? Ketones, UA Negative Negative  ? RBC, UA Trace (A) Negative  ? Bilirubin, UA Negative Negative  ? Urobilinogen, Ur 0.2 0.2 - 1.0 mg/dL  ? Nitrite, UA Negative Negative  ? Microscopic Examination See below:   ?Comp Met (CMET)  ?Result Value Ref Range  ? Glucose 190 (H) 70 - 99 mg/dL  ? BUN 21 8 - 27 mg/dL  ? Creatinine, Ser 0.97 0.57 - 1.00 mg/dL  ? eGFR 57 (L) >59 mL/min/1.73  ? BUN/Creatinine Ratio 22 12 - 28  ? Sodium  141 134 - 144 mmol/L  ? Potassium 3.8 3.5 - 5.2 mmol/L  ? Chloride 100 96 - 106 mmol/L  ? CO2 21 20 - 29 mmol/L  ? Calcium 9.2 8.7 - 10.3 mg/dL  ? Total Protein 6.7 6.0 - 8.5 g/dL  ? Albumin 4.4 3.6 - 4.6 g/dL  ? Globulin, Total 2.3 1.5 - 4.5 g/dL  ? Albumin/Globulin Ratio 1.9 1.2 - 2.2  ? Bilirubin Total 0.3 0.0 - 1.2 mg/dL  ? Alkaline Phosphatase 112 44 - 121 IU/L  ? AST 19 0 - 40 IU/L  ? ALT 24 0 - 32 IU/L  ?CBC w/Diff  ?Result Value Ref Range  ? WBC 9.2 3.4 - 10.8 x10E3/uL  ? RBC 5.07 3.77 - 5.28 x10E6/uL  ? Hemoglobin 14.7 11.1 - 15.9 g/dL  ? Hematocrit 44.3 34.0 - 46.6 %  ? MCV 87 79 - 97 fL  ? MCH 29.0 26.6 - 33.0 pg  ? MCHC 33.2 31.5 - 35.7 g/dL  ? RDW 14.1 11.7 - 15.4 %  ? Platelets 418 150 - 450 x10E3/uL  ? Neutrophils 68 Not Estab. %  ? Lymphs 22 Not Estab. %  ? Monocytes 7 Not Estab. %  ? Eos 2 Not Estab. %  ? Basos 1 Not Estab. %  ? Neutrophils Absolute 6.3 1.4 - 7.0 x10E3/uL  ? Lymphocytes Absolute 2.1 0.7 - 3.1 x10E3/uL  ? Monocytes Absolute 0.6 0.1 - 0.9 x10E3/uL  ? EOS (ABSOLUTE) 0.2 0.0 - 0.4 x10E3/uL  ? Basophils Absolute 0.1 0.0 - 0.2 x10E3/uL  ? Immature Granulocytes 0 Not Estab. %  ? Immature Grans (Abs) 0.0 0.0 - 0.1 x10E3/uL  ? ?   ? ? ?Current Outpatient Medications:  ?  amiodarone (PACERONE) 200 MG tablet, Take 200 mg by mouth daily. , Disp: , Rfl:  ?  clopidogrel (PLAVIX) 75 MG tablet, Take 75 mg by mouth daily., Disp: , Rfl:  ?  donepezil (ARICEPT) 10 MG tablet, Take 10 mg by mouth at bedtime., Disp: , Rfl:  ?  ELIQUIS 5 MG TABS tablet, Take 5 mg by mouth 2 (two) times daily. , Disp: , Rfl:  ?  gabapentin (NEURONTIN) 100 MG capsule, Take 100 mg by mouth 2 (two) times daily., Disp: , Rfl:  ?  glipiZIDE (GLUCOTROL XL) 2.5 MG 24 hr tablet, Take 2.5 mg by mouth daily., Disp: , Rfl:  ?  isosorbide mononitrate (IMDUR) 30 MG 24 hr tablet, Take  30 mg by mouth daily., Disp: , Rfl:  ?  levothyroxine (SYNTHROID) 88 MCG tablet, Take 88 mcg by mouth daily. , Disp: , Rfl:  ?  metoprolol succinate  (TOPROL-XL) 100 MG 24 hr tablet, TAKE ONE TABLET (100 MG) BY MOUTH EVERY DAY (TAKE WITH OR IMMEDIATELY AFTER A MEAL), Disp: 90 tablet, Rfl: 1 ?  montelukast (SINGULAIR) 10 MG tablet, TAKE ONE TABLET BY MOUTH AT BEDTIME, Disp: 90 tablet, Rfl: 0 ?  nitroGLYCERIN (NITROSTAT) 0.4 MG SL tablet, Place 0.4 mg under the tongue every 5 (five) minutes as needed for chest pain., Disp: , Rfl:  ?  pravastatin (PRAVACHOL) 20 MG tablet, Take 20 mg by mouth at bedtime., Disp: , Rfl:  ?  torsemide (DEMADEX) 20 MG tablet, Take 1 tablet (20 mg total) by mouth daily., Disp: 30 tablet, Rfl: 0 ?  venlafaxine XR (EFFEXOR-XR) 75 MG 24 hr capsule, Take 75 mg by mouth daily with breakfast. , Disp: , Rfl:   ? ? ?Assessment & Plan:  ?URI covid ordered fu on such  ?Meds sent as below ?Check CXR fgiven pts age and risk factors. ?Flu and strep ordered at this visit, both negative. pt advised to take Tylenol q 4- 6 hourly as needed. pt to take allegra q pm as needed and to call office if symptoms worsened pt verbalised understanding of such. ? ?  ? ?UTI  ?-ve for nitrites some leuks noticed on UA.  ?Fu on cultures if +ve will rx .  encouraged to increase water/fluid intake. ? ?Problem List Items Addressed This Visit   ?None ?Visit Diagnoses   ? ? Upper respiratory tract infection, unspecified type    -  Primary  ? Relevant Orders  ? Veritor Flu A/B Waived  ? Rapid Strep Screen ([facility] ONLY)  ? Novel Coronavirus, NAA ([facility])  ? Urine Culture  ? Urinalysis, Routine w reflex microscopic  ? Painful urination      ? Relevant Orders  ? Veritor Flu A/B Waived  ? Rapid Strep Screen ([facility] ONLY)  ? Novel Coronavirus, NAA ([facility])  ? Urine Culture  ? Urinalysis, Routine w reflex microscopic  ? ?  ?  ? ?Orders Placed This Encounter  ?Procedures  ? Rapid Strep Screen ([facility] ONLY)  ? Novel Coronavirus, NAA ([facility])  ? Urine Culture  ? Veritor Flu A/B Waived  ? Urinalysis, Routine w reflex microscopic  ?  ? ?No orders of the  defined types were placed in this encounter. ?  ? ?Follow up plan: ?No follow-ups on file. ? ? ?

## 2022-03-19 NOTE — Progress Notes (Signed)
Please let pt know this was normal.

## 2022-03-20 ENCOUNTER — Ambulatory Visit (INDEPENDENT_AMBULATORY_CARE_PROVIDER_SITE_OTHER): Payer: Medicare PPO | Admitting: *Deleted

## 2022-03-20 DIAGNOSIS — Z0000 Encounter for general adult medical examination without abnormal findings: Secondary | ICD-10-CM | POA: Diagnosis not present

## 2022-03-20 LAB — NOVEL CORONAVIRUS, NAA: SARS-CoV-2, NAA: NOT DETECTED

## 2022-03-20 NOTE — Progress Notes (Signed)
? ?Subjective:  ? Alison Richardson is a 87 y.o. female who presents for Medicare Annual (Subsequent) preventive examination. ? ?I connected with  [name] on 03/20/22 by a telephone enabled telemedicine application and verified that I am speaking with the correct person using two identifiers. ?  ?I discussed the limitations of evaluation and management by telemedicine. The patient expressed understanding and agreed to proceed. ? ?Patient location: home ? ?Provider location: tele-health not in office ? ? ?Review of Systems    ? ?Cardiac Risk Factors include: advanced age (>43mn, >>74women);diabetes mellitus;hypertension;family history of premature cardiovascular disease ? ?   ?Objective:  ?  ?There were no vitals filed for this visit. ?There is no height or weight on file to calculate BMI. ? ? ?  03/20/2022  ? 10:09 AM 01/26/2022  ?  8:00 AM 01/25/2022  ?  7:24 AM 12/13/2021  ?  2:22 PM 10/31/2021  ? 10:00 PM 10/31/2021  ?  9:32 AM 10/18/2021  ? 10:58 AM  ?Advanced Directives  ?Does Patient Have a Medical Advance Directive? No Yes Yes Yes Yes Yes Yes  ?Type of Advance Directive  Living will;Out of facility DNR (pink MOST or yellow form);Healthcare Power of Attorney Living will;Out of facility DNR (pink MOST or yellow form);Healthcare Power of ADole Foodof facility DNR (pink MOST or yellow form);Healthcare Power of [place];Out of facility DNR (pink MOST or yellow form) Living will  ?Does patient want to make changes to medical advance directive?  No - Patient declined  No - Patient declined No - Patient declined  No - Patient declined  ?Would patient like information on creating a medical advance directive? No - Patient declined    No - Patient declined  No - Patient declined  ? ? ?Current Medications (verified) ?Outpatient Encounter Medications as of 03/20/2022  ?Medication Sig  ? albuterol (VENTOLIN HFA) 108 (90 Base) MCG/ACT inhaler Inhale 2 puffs into the lungs every 6  (six) hours as needed for wheezing or shortness of breath.  ? amiodarone (PACERONE) 200 MG tablet Take 200 mg by mouth daily.   ? benzonatate (TESSALON) 100 MG capsule Take 1 capsule (100 mg total) by mouth 2 (two) times daily as needed for cough.  ? clopidogrel (PLAVIX) 75 MG tablet Take 75 mg by mouth daily.  ? donepezil (ARICEPT) 10 MG tablet Take 10 mg by mouth at bedtime.  ? ELIQUIS 5 MG TABS tablet Take 5 mg by mouth 2 (two) times daily.   ? fexofenadine (ALLEGRA ALLERGY) 180 MG tablet Take 1 tablet (180 mg total) by mouth daily.  ? gabapentin (NEURONTIN) 100 MG capsule Take 100 mg by mouth 2 (two) times daily.  ? glipiZIDE (GLUCOTROL XL) 2.5 MG 24 hr tablet Take 2.5 mg by mouth daily.  ? isosorbide mononitrate (IMDUR) 30 MG 24 hr tablet Take 30 mg by mouth daily.  ? levothyroxine (SYNTHROID) 88 MCG tablet Take 88 mcg by mouth daily.   ? metoprolol succinate (TOPROL-XL) 100 MG 24 hr tablet TAKE ONE TABLET (100 MG) BY MOUTH EVERY DAY (TAKE WITH OR IMMEDIATELY AFTER A MEAL)  ? montelukast (SINGULAIR) 10 MG tablet TAKE ONE TABLET BY MOUTH AT BEDTIME  ? nitroGLYCERIN (NITROSTAT) 0.4 MG SL tablet Place 0.4 mg under the tongue every 5 (five) minutes as needed for chest pain.  ? pravastatin (PRAVACHOL) 20 MG tablet Take 20 mg by mouth at bedtime.  ? torsemide (DEMADEX) 20 MG tablet Take 1 tablet (20 mg  total) by mouth daily.  ? venlafaxine XR (EFFEXOR-XR) 75 MG 24 hr capsule Take 75 mg by mouth daily with breakfast.   ? ?No facility-administered encounter medications on file as of 03/20/2022.  ? ? ?Allergies (verified) ?Pantoprazole and Metformin and related  ? ?History: ?Past Medical History:  ?Diagnosis Date  ? Arrhythmia   ? Atrial flutter ([place]) 01/2018  ? new onset   ? Basal cell carcinoma of back   ? Basal cell carcinoma of lip   ? Cerebral aneurysm   ? followed by [facility]  ? CHF (congestive heart failure) ([place])   ? DDD (degenerative disc disease), lumbar   ? superior plate depression, L3 08/18/2014  ? Diabetes  mellitus type II, controlled ([place])   ? Diverticulosis   ? Dysrhythmia   ? Paroxysmal Supraventricular Tachycardia  ? Dysthymia   ? depression  ? GERD (gastroesophageal reflux disease)   ? History of meniscal tear   ? Hyperlipidemia   ? Hypertension   ? Hypothyroidism   ? Late onset Alzheimer's disease with behavioral disturbance ([place])   ? Mild pulmonary hypertension ([place])   ? Overactive bladder   ? Peripheral vascular disease ([place])   ? Seasonal allergic rhinitis   ? Sleep apnea   ? ?Past Surgical History:  ?Procedure Laterality Date  ? APPENDECTOMY  1946  ? BASAL CELL CARCINOMA EXCISION    ? 2006 and 2009 removed from back and lip  ? CARDIOVASCULAR STRESS TEST  2015  ? nuclear cardiac stress test negative for ischemia - Dr. Satira Sark  ? CATARACT EXTRACTION, BILATERAL  2006  ? COLONOSCOPY  2014  ? COLONOSCOPY N/A 08/19/2020  ? Procedure: COLONOSCOPY;  Surgeon: Lesly Rubenstein, MD;  Location[facility] ENDOSCOPY;  Service: Endoscopy;  Laterality: N/A;  ? [place]  ? ESOPHAGOGASTRODUODENOSCOPY N/A 08/19/2020  ? Procedure: ESOPHAGOGASTRODUODENOSCOPY (EGD);  Surgeon: Lesly Rubenstein, MD;  Location[facility] ENDOSCOPY;  Service: Endoscopy;  Laterality: N/A;  ? INJECTION KNEE Left 05/2015  ? LEFT HEART CATH AND CORONARY ANGIOGRAPHY N/A 10/12/2021  ? Procedure: LEFT HEART CATH AND CORONARY ANGIOGRAPHY;  Surgeon: Corey Skains, MD;  Location: [facility] CV LAB;  Service: Cardiovascular;  Laterality: N/A;  ? 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 Dr. Barnet Pall  ? TONSILLECTOMY AND ADENOIDECTOMY  1953  ? TOTAL ABDOMINAL HYSTERECTOMY  1986  ? [facility]  ? ?Family History  ?Problem Relation Age of Onset  ? Hypertension Mother   ? Heart disease Father   ? CAD Father   ? Heart disease Sister   ? Diabetes Sister   ? Diabetes Paternal Uncle   ? ?Social History  ? ?Socioeconomic History  ? Marital status: Widowed  ?  Spouse name: Not on file  ? Number of children: 5  ? Years of education: Not on file  ? Highest  education level: Not on file  ?Occupational History  ? Not on file  ?Tobacco Use  ? Smoking status: Never  ? Smokeless tobacco: Never  ?Vaping Use  ? Vaping Use: Never used  ?Substance and Sexual Activity  ? Alcohol use: Yes  ?  Alcohol/week: 3.0 standard drinks  ?  Types: 3 Shots of liquor per week  ?  Comment: 3 x week   ? Drug use: Never  ? Sexual activity: Not Currently  ?Other Topics Concern  ? Not on file  ?Social History Narrative  ? Not on file  ? ?Social Determinants of Health  ? ?Financial Resource Strain: Low  Risk   ? Difficulty of Paying Living Expenses: Not hard at all  ?Food Insecurity: No Food Insecurity  ? Worried About Charity fundraiser in the Last Year: Never true  ? Ran Out of Food in the Last Year: Never true  ?Transportation Needs: No Transportation Needs  ? Lack of Transportation (Medical): No  ? Lack of Transportation (Non-Medical): No  ?Physical Activity: Insufficiently Active  ? Days of Exercise per Week: 2 days  ? Minutes of Exercise per Session: 30 min  ?Stress: No Stress Concern Present  ? Feeling of Stress : Not at all  ?Social Connections: Moderately Integrated  ? Frequency of Communication with Friends and Family: More than three times a week  ? Frequency of Social Gatherings with Friends and Family: More than three times a week  ? Attends Religious Services: More than 4 times per year  ? Active Member of Clubs or Organizations: Yes  ? Attends Archivist Meetings: More than 4 times per year  ? Marital Status: Widowed  ? ? ?Tobacco Counseling ?Counseling given: Not Answered ? ? ?Clinical Intake: ? ?Pre-visit preparation completed: Yes ? ?Pain : No/denies pain ? ?  ? ?Nutritional Risks: None ?Diabetes: Yes ?CBG done?: No ?Did pt. bring in CBG monitor from home?: No ? ?  ? ?Diabetic?   Yes ? ?Nutrition Risk Assessment: ? ?Has the patient had any N/V/D within the last 2 months?  No  ?Does the patient have any non-healing wounds?  No  ?Has the patient had any unintentional  weight loss or weight gain?  No  ? ?Diabetes: ? ?Is the patient diabetic?  Yes  ?If diabetic, was a CBG obtained today?  No  ?Did the patient bring in their glucometer from home?  No  ?How often do you monitor you

## 2022-03-20 NOTE — Patient Instructions (Signed)
Alison Richardson , ?Thank you for taking time to come for your Medicare Wellness Visit. I appreciate your ongoing commitment to your health goals. Please review the following plan we discussed and let me know if I can assist you in the future.  ? ?Screening recommendations/referrals: ?Colonoscopy: no longer required ?Mammogram: no longer required ?Bone Density:  ?Recommended yearly ophthalmology/optometry visit for glaucoma screening and checkup ?Recommended yearly dental visit for hygiene and checkup ? ?Vaccinations: ?Influenza vaccine: up to date ?Pneumococcal vaccine: up to date ?Tdap vaccine: up to date ?Shingles vaccine: up to date ? ? ?Next appointment: 04-12-2022 @ 2:20  [facility] ? ? ?Preventive Care 7 Years and Older, Female ?Preventive care refers to lifestyle choices and visits with your health care provider that can promote health and wellness. ?What does preventive care include? ?A yearly physical exam. This is also called an annual well check. ?Dental exams once or twice a year. ?Routine eye exams. Ask your health care provider how often you should have your eyes checked. ?Personal lifestyle choices, including: ?Daily care of your teeth and gums. ?Regular physical activity. ?Eating a healthy diet. ?Avoiding tobacco and drug use. ?Limiting alcohol use. ?Practicing safe sex. ?Taking low-dose aspirin every day. ?Taking vitamin and mineral supplements as recommended by your health care provider. ?What happens during an annual well check? ?The services and screenings done by your health care provider during your annual well check will depend on your age, overall health, lifestyle risk factors, and family history of disease. ?Counseling  ?Your health care provider may ask you questions about your: ?Alcohol use. ?Tobacco use. ?Drug use. ?Emotional well-being. ?Home and relationship well-being. ?Sexual activity. ?Eating habits. ?History of falls. ?Memory and ability to understand (cognition). ?Work and work  Statistician. ?Reproductive health. ?Screening  ?You may have the following tests or measurements: ?Height, weight, and BMI. ?Blood pressure. ?Lipid and cholesterol levels. These may be checked every 5 years, or more frequently if you are over 79 years old. ?Skin check. ?Lung cancer screening. You may have this screening every year starting at age 80 if you have a 30-pack-year history of smoking and currently smoke or have quit within the past 15 years. ?Fecal occult blood test (FOBT) of the stool. You may have this test every year starting at age 81. ?Flexible sigmoidoscopy or colonoscopy. You may have a sigmoidoscopy every 5 years or a colonoscopy every 10 years starting at age 66. ?Hepatitis C blood test. ?Hepatitis B blood test. ?Sexually transmitted disease (STD) testing. ?Diabetes screening. This is done by checking your blood sugar (glucose) after you have not eaten for a while (fasting). You may have this done every 1-3 years. ?Bone density scan. This is done to screen for osteoporosis. You may have this done starting at age 50. ?Mammogram. This may be done every 1-2 years. Talk to your health care provider about how often you should have regular mammograms. ?Talk with your health care provider about your test results, treatment options, and if necessary, the need for more tests. ?Vaccines  ?Your health care provider may recommend certain vaccines, such as: ?Influenza vaccine. This is recommended every year. ?Tetanus, diphtheria, and acellular pertussis (Tdap, Td) vaccine. You may need a Td booster every 10 years. ?Zoster vaccine. You may need this after age 9. ?Pneumococcal 13-valent conjugate (PCV13) vaccine. One dose is recommended after age 45. ?Pneumococcal polysaccharide (PPSV23) vaccine. One dose is recommended after age 28. ?Talk to your health care provider about which screenings and vaccines you need and how often  you need them. ?This information is not intended to replace advice given to you by  your health care provider. Make sure you discuss any questions you have with your health care provider. ?Document Released: 01/06/2016 Document Revised: 08/29/2016 Document Reviewed: 10/11/2015 ?Elsevier Interactive Patient Education ? 2017 [place]. ? ?Fall Prevention in the Home ?Falls can cause injuries. They can happen to people of all ages. There are many things you can do to make your home safe and to help prevent falls. ?What can I do on the outside of my home? ?Regularly fix the edges of walkways and driveways and fix any cracks. ?Remove anything that might make you trip as you walk through a door, such as a raised step or threshold. ?Trim any bushes or trees on the path to your home. ?Use bright outdoor lighting. ?Clear any walking paths of anything that might make someone trip, such as rocks or tools. ?Regularly check to see if handrails are loose or broken. Make sure that both sides of any steps have handrails. ?Any raised decks and porches should have guardrails on the edges. ?Have any leaves, snow, or ice cleared regularly. ?Use sand or salt on walking paths during winter. ?Clean up any spills in your garage right away. This includes oil or grease spills. ?What can I do in the bathroom? ?Use night lights. ?Install grab bars by the toilet and in the tub and shower. Do not use towel bars as grab bars. ?Use non-skid mats or decals in the tub or shower. ?If you need to sit down in the shower, use a plastic, non-slip stool. ?Keep the floor dry. Clean up any water that spills on the floor as soon as it happens. ?Remove soap buildup in the tub or shower regularly. ?Attach bath mats securely with double-sided non-slip rug tape. ?Do not have throw rugs and other things on the floor that can make you trip. ?What can I do in the bedroom? ?Use night lights. ?Make sure that you have a light by your bed that is easy to reach. ?Do not use any sheets or blankets that are too big for your bed. They should not hang  down onto the floor. ?Have a firm chair that has side arms. You can use this for support while you get dressed. ?Do not have throw rugs and other things on the floor that can make you trip. ?What can I do in the kitchen? ?Clean up any spills right away. ?Avoid walking on wet floors. ?Keep items that you use a lot in easy-to-reach places. ?If you need to reach something above you, use a strong step stool that has a grab bar. ?Keep electrical cords out of the way. ?Do not use floor polish or wax that makes floors slippery. If you must use wax, use non-skid floor wax. ?Do not have throw rugs and other things on the floor that can make you trip. ?What can I do with my stairs? ?Do not leave any items on the stairs. ?Make sure that there are handrails on both sides of the stairs and use them. Fix handrails that are broken or loose. Make sure that handrails are as long as the stairways. ?Check any carpeting to make sure that it is firmly attached to the stairs. Fix any carpet that is loose or worn. ?Avoid having throw rugs at the top or bottom of the stairs. If you do have throw rugs, attach them to the floor with carpet tape. ?Make sure that you have a light  switch at the top of the stairs and the bottom of the stairs. If you do not have them, ask someone to add them for you. ?What else can I do to help prevent falls? ?Wear shoes that: ?Do not have high heels. ?Have rubber bottoms. ?Are comfortable and fit you well. ?Are closed at the toe. Do not wear sandals. ?If you use a stepladder: ?Make sure that it is fully opened. Do not climb a closed stepladder. ?Make sure that both sides of the stepladder are locked into place. ?Ask someone to hold it for you, if possible. ?Clearly mark and make sure that you can see: ?Any grab bars or handrails. ?First and last steps. ?Where the edge of each step is. ?Use tools that help you move around (mobility aids) if they are needed. These  include: ?Canes. ?Walkers. ?Scooters. ?Crutches. ?Turn on the lights when you go into a dark area. Replace any light bulbs as soon as they burn out. ?Set up your furniture so you have a clear path. Avoid moving your furniture around. ?If any of your floors a

## 2022-03-21 LAB — URINE CULTURE

## 2022-03-22 LAB — URINALYSIS, ROUTINE W REFLEX MICROSCOPIC
Bilirubin, UA: NEGATIVE
Glucose, UA: NEGATIVE
Ketones, UA: NEGATIVE
Nitrite, UA: NEGATIVE
Specific Gravity, UA: 1.02 (ref 1.005–1.030)
Urobilinogen, Ur: 0.2 mg/dL (ref 0.2–1.0)
pH, UA: 5.5 (ref 5.0–7.5)

## 2022-03-22 LAB — MICROSCOPIC EXAMINATION

## 2022-03-22 LAB — VERITOR FLU A/B WAIVED
Influenza A: NEGATIVE
Influenza B: NEGATIVE

## 2022-03-22 LAB — RAPID STREP SCREEN (MED CTR MEBANE ONLY): Strep Gp A Ag, IA W/Reflex: NEGATIVE

## 2022-03-22 LAB — CULTURE, GROUP A STREP: Strep A Culture: NEGATIVE

## 2022-03-22 MED ORDER — SULFAMETHOXAZOLE-TRIMETHOPRIM 800-160 MG PO TABS: 1.0000 | ORAL_TABLET | Freq: Two times a day (BID) | ORAL | 0 refills | Status: AC

## 2022-03-22 NOTE — Addendum Note (Signed)
Addended byCharlynne Cousins on: 03/22/2022 08:52 AM ? ? Modules accepted: Orders ? ?

## 2022-03-22 NOTE — Progress Notes (Signed)
Will send in bactrim for pt pl let her know. Cultures grew ecoli

## 2022-03-26 DIAGNOSIS — R2689 Other abnormalities of gait and mobility: Secondary | ICD-10-CM | POA: Diagnosis not present

## 2022-03-26 DIAGNOSIS — Z9181 History of falling: Secondary | ICD-10-CM | POA: Diagnosis not present

## 2022-03-26 DIAGNOSIS — G301 Alzheimer's disease with late onset: Secondary | ICD-10-CM | POA: Diagnosis not present

## 2022-03-26 DIAGNOSIS — A419 Sepsis, unspecified organism: Secondary | ICD-10-CM | POA: Diagnosis not present

## 2022-03-26 DIAGNOSIS — R41841 Cognitive communication deficit: Secondary | ICD-10-CM | POA: Diagnosis not present

## 2022-03-26 DIAGNOSIS — M25511 Pain in right shoulder: Secondary | ICD-10-CM | POA: Diagnosis not present

## 2022-03-26 DIAGNOSIS — M6281 Muscle weakness (generalized): Secondary | ICD-10-CM | POA: Diagnosis not present

## 2022-03-26 DIAGNOSIS — R2681 Unsteadiness on feet: Secondary | ICD-10-CM | POA: Diagnosis not present

## 2022-03-26 DIAGNOSIS — N3000 Acute cystitis without hematuria: Secondary | ICD-10-CM | POA: Diagnosis not present

## 2022-04-02 ENCOUNTER — Ambulatory Visit: Payer: Self-pay | Admitting: *Deleted

## 2022-04-02 NOTE — Telephone Encounter (Signed)
?  Chief Complaint: wheezing and coughing spells ?Symptoms: wheezing at times. And c/o coughing spells after being treated for URI. Clear phlegm reported. Coughing spells approx every 20 minutes. Tessalon medication not helping ?Frequency: since March  ?Pertinent Negatives: Patient denies chest pain difficulty breathing no fever. ?Disposition: '[]'$ ED /'[]'$ Urgent Care (no appt availability in office) / '[x]'$ Appointment(In office/virtual)/ '[]'$  [facility]/ '[]'$ Home Care/ '[]'$ Refused Recommended Disposition /'[]'$ [facility]/ '[]'$  Follow-up with PCP ?Additional Notes:  ? ?Please notify patient if appt needed for requesting cough medication. Appt scheduled for 04/03/22 and appt already scheduled for f/u on 04/12/22. Please advise . ? ? Reason for Disposition ? SEVERE coughing spells (e.g., whooping sound after coughing, vomiting after coughing) ? ?Answer Assessment - Initial Assessment Questions ?1. ONSET: "When did the cough begin?"  ?    In March and has been seen by PCP ?2. SEVERITY: "How bad is the cough today?"  ?    Constant and has coughing spells every 20 minutes  ?3. SPUTUM: "Describe the color of your sputum" (none, dry cough; clear, white, yellow, green) ?    Clear  ?4. HEMOPTYSIS: "Are you coughing up any blood?" If so ask: "How much?" (flecks, streaks, tablespoons, etc.) ?    no ?5. DIFFICULTY BREATHING: "Are you having difficulty breathing?" If Yes, ask: "How bad is it?" (e.g., mild, moderate, severe)  ?  - MILD: No SOB at rest, mild SOB with walking, speaks normally in sentences, can lie down, no retractions, pulse < 100.  ?  - MODERATE: SOB at rest, SOB with minimal exertion and prefers to sit, cannot lie down flat, speaks in phrases, mild retractions, audible wheezing, pulse 100-120.  ?  - SEVERE: Very SOB at rest, speaks in single words, struggling to breathe, sitting hunched forward, retractions, pulse > 120  ?    Denies . Wheezing at times  ?6. FEVER: "Do you have a fever?" If Yes, ask: "What is  your temperature, how was it measured, and when did it start?" ?    no ?7. CARDIAC HISTORY: "Do you have any history of heart disease?" (e.g., heart attack, congestive heart failure)  ?    Heart issues  ?8. LUNG HISTORY: "Do you have any history of lung disease?"  (e.g., pulmonary embolus, asthma, emphysema) ?    na ?9. PE RISK FACTORS: "Do you have a history of blood clots?" (or: recent major surgery, recent prolonged travel, bedridden) ?    na ?10. OTHER SYMPTOMS: "Do you have any other symptoms?" (e.g., runny nose, wheezing, chest pain) ?      Wheezing at times. Coughing spells  ?11. PREGNANCY: "Is there any chance you are pregnant?" "When was your last menstrual period?" ?      na ?12. TRAVEL: "Have you traveled out of the country in the last month?" (e.g., travel history, exposures) ?      na ? ?Protocols used: Cough - Acute Productive-A-AH ? ?

## 2022-04-02 NOTE — Telephone Encounter (Signed)
Noted  

## 2022-04-03 ENCOUNTER — Encounter: Payer: Self-pay | Admitting: Nurse Practitioner

## 2022-04-03 ENCOUNTER — Ambulatory Visit (INDEPENDENT_AMBULATORY_CARE_PROVIDER_SITE_OTHER): Payer: Medicare PPO | Admitting: Nurse Practitioner

## 2022-04-03 VITALS — BP 109/68 | HR 63 | Temp 98.0°F | Ht 65.98 in | Wt 177.0 lb

## 2022-04-03 DIAGNOSIS — G4733 Obstructive sleep apnea (adult) (pediatric): Secondary | ICD-10-CM | POA: Diagnosis not present

## 2022-04-03 DIAGNOSIS — I5032 Chronic diastolic (congestive) heart failure: Secondary | ICD-10-CM | POA: Diagnosis not present

## 2022-04-03 DIAGNOSIS — I252 Old myocardial infarction: Secondary | ICD-10-CM | POA: Insufficient documentation

## 2022-04-03 DIAGNOSIS — Z8679 Personal history of other diseases of the circulatory system: Secondary | ICD-10-CM | POA: Insufficient documentation

## 2022-04-03 DIAGNOSIS — R051 Acute cough: Secondary | ICD-10-CM | POA: Insufficient documentation

## 2022-04-03 DIAGNOSIS — Z9989 Dependence on other enabling machines and devices: Secondary | ICD-10-CM | POA: Insufficient documentation

## 2022-04-03 DIAGNOSIS — Z9181 History of falling: Secondary | ICD-10-CM | POA: Insufficient documentation

## 2022-04-03 DIAGNOSIS — F5104 Psychophysiologic insomnia: Secondary | ICD-10-CM

## 2022-04-03 HISTORY — DX: History of falling: Z91.81

## 2022-04-03 MED ORDER — AMOXICILLIN-POT CLAVULANATE 875-125 MG PO TABS: 1.0000 | ORAL_TABLET | Freq: Two times a day (BID) | ORAL | 0 refills | Status: AC

## 2022-04-03 NOTE — Assessment & Plan Note (Signed)
Chronic, ongoing.  Has tried Trazodone and Melatonin without benefit.  Will work on new sleep study as suspect a proper CPAP and regular use would benefit her OSA.  Could consider Mirtazapine at low dose if ongoing issues, which may benefit her weight (protein calorie malnutrition).  Discussed with her would avoid benzo family of medications.  If failure of multiple medications and ongoing OSA even with proper CPAP, could consider a very low dose Belsomra with close monitoring. ?

## 2022-04-03 NOTE — Patient Instructions (Signed)
Recommend taking Coricidin over the counter for cough and diabetic tussin. ? ?Cough, Adult ?A cough helps to clear your throat and lungs. A cough may be a sign of an illness or another medical condition. ?An acute cough may only last 2-3 weeks, while a chronic cough may last 8 or more weeks. ?Many things can cause a cough. They include: ?Germs (viruses or bacteria) that attack the airway. ?Breathing in things that bother (irritate) your lungs. ?Allergies. ?Asthma. ?Mucus that runs down the back of your throat (postnasal drip). ?Smoking. ?Acid backing up from the stomach into the tube that moves food from the mouth to the stomach (gastroesophageal reflux). ?Some medicines. ?Lung problems. ?Other medical conditions, such as heart failure or a blood clot in the lung (pulmonary embolism). ?Follow these instructions at home: ?Medicines ?Take over-the-counter and prescription medicines only as told by your doctor. ?Talk with your doctor before you take medicines that stop a cough (cough suppressants). ?Lifestyle ? ?Do not smoke, and try not to be around smoke. Do not use any products that contain nicotine or tobacco, such as cigarettes, e-cigarettes, and chewing tobacco. If you need help quitting, ask your doctor. ?Drink enough fluid to keep your pee (urine) pale yellow. ?Avoid caffeine. ?Do not drink alcohol if your doctor tells you not to drink. ?General instructions ? ?Watch for any changes in your cough. Tell your doctor about them. ?Always cover your mouth when you cough. ?Stay away from things that make you cough, such as perfume, candles, campfire smoke, or cleaning products. ?If the air is dry, use a cool mist vaporizer or humidifier in your home. ?If your cough is worse at night, try using extra pillows to raise your head up higher while you sleep. ?Rest as needed. ?Keep all follow-up visits as told by your doctor. This is important. ?Contact a doctor if: ?You have new symptoms. ?You cough up pus. ?Your cough  does not get better after 2-3 weeks, or your cough gets worse. ?Cough medicine does not help your cough and you are not sleeping well. ?You have pain that gets worse or pain that is not helped with medicine. ?You have a fever. ?You are losing weight and you do not know why. ?You have night sweats. ?Get help right away if: ?You cough up blood. ?You have trouble breathing. ?Your heartbeat is very fast. ?These symptoms may be an emergency. Do not wait to see if the symptoms will go away. Get medical help right away. Call your local emergency services (911 in the [place] Do not drive yourself to the hospital. ?Summary ?A cough helps to clear your throat and lungs. Many things can cause a cough. ?Take over-the-counter and prescription medicines only as told by your doctor. ?Always cover your mouth when you cough. ?Contact a doctor if you have new symptoms or you have a cough that does not get better or gets worse. ?This information is not intended to replace advice given to you by your health care provider. Make sure you discuss any questions you have with your health care provider. ?Document Revised: 01/29/2020 Document Reviewed: 12/29/2018 ?Elsevier Patient Education ? [place]. ? ?

## 2022-04-03 NOTE — Assessment & Plan Note (Signed)
Ongoing since 03/19/22 treated with Bactrim by previous provider, due to presence of UTI.  At this time will start Augmentin for lung coverage.  Recommend patient take Coricidin at home + diabetic Tussin for cough relief.  Avoid Nyquil or Dayquil products due to age and chronic illnesses.  Printed her out copy of BEERS criteria patient information.  No Prednisone at this time due to HF.  Will repeat imaging to check further and obtain BNP.  Recommend: ?- Increased rest ?- Increasing Fluids ?- Acetaminophen as needed for fever/pain.  ?- Salt water gargling, chloraseptic spray and throat lozenges ?- Humidifying the air ?Return to office as scheduled with Santiago Glad on 04/12/21. ?

## 2022-04-03 NOTE — Assessment & Plan Note (Signed)
Chronic, ongoing with current ongoing cough.  Euvolemic.  Check labs today CBC, CMP, BNP and repeat chest imaging.  Continue to collaborate with cardiology.  With her diabetes may benefit from addition of Jardiance or [place] for heart if kidney function allows.  Recommend: ?- Reminded to call for an overnight weight gain of >2 pounds or a weekly weight gain of >5 pounds ?- not adding salt to food and read food labels. Reviewed the importance of keeping daily sodium intake to '2000mg'$  daily. ?- Avoid Ibuprofen products. ?

## 2022-04-03 NOTE — Progress Notes (Signed)
? ?BP 109/68   Pulse 63   Temp 98 ?F (36.7 ?C) (Oral)   Ht 5' 5.98" (1.676 m)   Wt 177 lb (80.3 kg)   SpO2 93%   BMI 28.59 kg/m?   ? ?Subjective:  ? ? Patient ID: Alison Richardson, female    DOB: Jan 09, 1933, 88 y.o.   MRN: [id number] ? ?HPI: ?Alison Richardson is a 88 y.o. female ? ?Chief Complaint  ?Patient presents with  ? Cough  ?  Patient is here for a persistent cough. Patient states she recently had a visit with Dr.Vigg for her cough. Patient states she was prescribed antibiotics and was ordered an chest x-ray. Patient has since completed her course of treatment and says the medication only helped her minimally. Patient states she is still coughing up clear phlegm.    ? ?COUGH ?Was initially treated on 03/19/22 -- was treated with Bactrim (had UTI at time as well), Allegra, Tessalon and Albuterol.  She is currently taking Singulair and Zyrtec, no Allegra.  Was tested for Covid and Flu + strep = these were negative.  Had CXR which showed no acute findings. ? ?Cough is still lingering, having occasional spasms with this.  Clear phlegm -- no blood or foamy appearance.  Sleeps on 2 pillows at night, no increase, denies orthopnea.  Followed by cardiology, last seen Dr. Corky Sox 11/23/21. ?Duration: weeks ?Circumstances of initial development of cough: URI ?Cough severity: mild ?Cough description: productive and irritating ?Aggravating factors:  worse in the AM ?Alleviating factors: Nyquil and cough syrup ?Status:  fluctuating ?Treatments attempted: cold/sinus and mucinex and abx ?Wheezing: yes ?Shortness of breath: yes -- mild  ?Chest pain: no ?Chest tightness:no ?Nasal congestion: yes ?Runny nose: yes ?Postnasal drip: yes ?Frequent throat clearing or swallowing: yes ?Hemoptysis: no ?Fevers: no ?Night sweats: no ?Weight loss: no ?Heartburn: no ?Recent foreign travel: no ?Tuberculosis contacts: no  ? ?INSOMNIA ?Is an ongoing issue -- has sleep apnea not using CPAP.  Been many years since sleep study and is agreeable to a  new one as reports her current CPAP causes her mouth to burn and she would like new testing.  Inquired into Klonopin for sleep aide, discussed that this is not recommended in her age group due to risk factors. ?Duration: chronic ?Satisfied with sleep quality: no ?Difficulty falling asleep: no ?Difficulty staying asleep: yes ?Waking a few hours after sleep onset: yes ?Early morning awakenings: no ?Daytime hypersomnolence: no ?Wakes feeling refreshed: yes ?Good sleep hygiene: yes ?Apnea: no ?Snoring: yes ?Depressed/anxious mood: yes ?Recent stress: no ?Restless legs/nocturnal leg cramps: no ?Chronic pain/arthritis: yes ?History of sleep study: yes ?Treatments attempted: melatonin and benadryl, Trazodone  ? ?Relevant past medical, surgical, family and social history reviewed and updated as indicated. Interim medical history since our last visit reviewed. ?Allergies and medications reviewed and updated. ? ?Review of Systems  ?Constitutional:  Positive for fatigue. Negative for activity change, appetite change, diaphoresis and fever.  ?Respiratory:  Positive for cough, chest tightness, shortness of breath and wheezing.   ?Cardiovascular:  Negative for chest pain, palpitations and leg swelling.  ?Gastrointestinal: Negative.   ?Neurological: Negative.   ?Psychiatric/Behavioral: Negative.    ? ?Per HPI unless specifically indicated above ? ?   ?Objective:  ?  ?BP 109/68   Pulse 63   Temp 98 ?F (36.7 ?C) (Oral)   Ht 5' 5.98" (1.676 m)   Wt 177 lb (80.3 kg)   SpO2 93%   BMI 28.59 kg/m?   ?Wt Readings from Last  3 Encounters:  ?04/03/22 177 lb (80.3 kg)  ?03/19/22 176 lb (79.8 kg)  ?03/02/22 177 lb 6.4 oz (80.5 kg)  ?  ?Physical Exam ?Vitals and nursing note reviewed.  ?Constitutional:   ?   General: She is awake. She is not in acute distress. ?   Appearance: She is well-developed and well-groomed. She is not ill-appearing or toxic-appearing.  ?HENT:  ?   Head: Normocephalic.  ?   Right Ear: Hearing, ear canal and  external ear normal. A middle ear effusion is present.  ?   Left Ear: Hearing, ear canal and external ear normal. A middle ear effusion is present.  ?   Nose: Rhinorrhea present. Rhinorrhea is clear.  ?   Right Sinus: No maxillary sinus tenderness or frontal sinus tenderness.  ?   Left Sinus: No maxillary sinus tenderness or frontal sinus tenderness.  ?   Mouth/Throat:  ?   Mouth: Mucous membranes are moist.  ?   Pharynx: Posterior oropharyngeal erythema (mild with cobblestone appearance) present. No pharyngeal swelling or oropharyngeal exudate.  ?Eyes:  ?   General: Lids are normal.     ?   Right eye: No discharge.     ?   Left eye: No discharge.  ?   Conjunctiva/sclera: Conjunctivae normal.  ?   Pupils: Pupils are equal, round, and reactive to light.  ?Neck:  ?   Thyroid: No thyromegaly.  ?   Vascular: No carotid bruit or JVD.  ?Cardiovascular:  ?   Rate and Rhythm: Normal rate and regular rhythm.  ?   Heart sounds: Normal heart sounds. No murmur heard. ?  No gallop. No S3 sounds.  ?Pulmonary:  ?   Effort: Pulmonary effort is normal. No accessory muscle usage or respiratory distress.  ?   Breath sounds: Wheezing present.  ?   Comments: Scattered wheezes noted throughout, no rhonchi. ?Abdominal:  ?   General: Bowel sounds are normal. There is no distension.  ?   Palpations: Abdomen is soft.  ?   Tenderness: There is no abdominal tenderness.  ?Musculoskeletal:  ?   Cervical back: Normal range of motion and neck supple.  ?   Right lower leg: No edema.  ?   Left lower leg: No edema.  ?Lymphadenopathy:  ?   Cervical: No cervical adenopathy.  ?Skin: ?   General: Skin is warm and dry.  ?Neurological:  ?   Mental Status: She is alert and oriented to person, place, and time.  ?Psychiatric:     ?   Attention and Perception: Attention normal.     ?   Mood and Affect: Mood normal.     ?   Speech: Speech normal.     ?   Behavior: Behavior normal. Behavior is cooperative.     ?   Thought Content: Thought content normal.   ? ? ?Results for orders placed or performed in visit on 03/19/22  ?Rapid Strep Screen ([facility] ONLY)  ? Specimen: Other  ? Other  ?Result Value Ref Range  ? Strep Gp A Ag, IA W/Reflex Negative Negative  ?Novel Coronavirus, NAA ([facility])  ? Specimen: Nasopharyngeal(NP) swabs in vial transport medium  ?Result Value Ref Range  ? SARS-CoV-2, NAA Not Detected Not Detected  ?Urine Culture  ? Specimen: Urine  ? UR  ?Result Value Ref Range  ? Urine Culture, Routine Final report (A)   ? Organism ID, Bacteria Escherichia coli (A)   ? Antimicrobial Susceptibility Comment   ?Microscopic  Examination  ?Result Value Ref Range  ? WBC, UA 11-30 (A) 0 - 5 /hpf  ? RBC 11-30 (A) 0 - 2 /hpf  ? Epithelial Cells (non renal) 0-10 0 - 10 /hpf  ? Mucus, UA Present (A) Not Estab.  ? Bacteria, UA Moderate (A) None seen/Few  ?Culture, Group A Strep  ? Other  ?Result Value Ref Range  ? Strep A Culture Negative   ?Veritor Flu A/B Waived  ?Result Value Ref Range  ? Influenza A Negative Negative  ? Influenza B Negative Negative  ?Urinalysis, Routine w reflex microscopic  ?Result Value Ref Range  ? Specific Gravity, UA 1.020 1.005 - 1.030  ? pH, UA 5.5 5.0 - 7.5  ? Color, UA Yellow Yellow  ? Appearance Ur Cloudy (A) Clear  ? Leukocytes,UA 2+ (A) Negative  ? Protein,UA 1+ (A) Negative/Trace  ? Glucose, UA Negative Negative  ? Ketones, UA Negative Negative  ? RBC, UA 2+ (A) Negative  ? Bilirubin, UA Negative Negative  ? Urobilinogen, Ur 0.2 0.2 - 1.0 mg/dL  ? Nitrite, UA Negative Negative  ? Microscopic Examination See below:   ? ?   ?Assessment & Plan:  ? ?Problem List Items Addressed This Visit   ? ?  ? Cardiovascular and Mediastinum  ? Chronic diastolic CHF (congestive heart failure) ([place]) - Primary  ?  Chronic, ongoing with current ongoing cough.  Euvolemic.  Check labs today CBC, CMP, BNP and repeat chest imaging.  Continue to collaborate with cardiology.  With her diabetes may benefit from addition of Jardiance or [place] for heart if  kidney function allows.  Recommend: ?- Reminded to call for an overnight weight gain of >2 pounds or a weekly weight gain of >5 pounds ?- not adding salt to food and read food labels. Reviewed the importance of keeping daily sod

## 2022-04-03 NOTE — Assessment & Plan Note (Signed)
Referral for new sleep study as suspect a proper CPAP may benefit her insomnia. ?

## 2022-04-04 ENCOUNTER — Ambulatory Visit
Admission: RE | Admit: 2022-04-04 | Discharge: 2022-04-04 | Disposition: A | Discharge status: Home / Self Care | Payer: Medicare PPO | Source: Ambulatory Visit | Attending: Nurse Practitioner | Admitting: Nurse Practitioner

## 2022-04-04 ENCOUNTER — Ambulatory Visit
Admission: RE | Admit: 2022-04-04 | Discharge: 2022-04-04 | Disposition: A | Discharge status: Home / Self Care | Payer: Medicare PPO | Attending: Nurse Practitioner | Admitting: Nurse Practitioner

## 2022-04-04 DIAGNOSIS — R051 Acute cough: Secondary | ICD-10-CM | POA: Insufficient documentation

## 2022-04-04 DIAGNOSIS — I5032 Chronic diastolic (congestive) heart failure: Secondary | ICD-10-CM | POA: Insufficient documentation

## 2022-04-04 DIAGNOSIS — R059 Cough, unspecified: Secondary | ICD-10-CM | POA: Diagnosis not present

## 2022-04-04 LAB — COMPREHENSIVE METABOLIC PANEL
ALT: 23 IU/L (ref 0–32)
AST: 20 IU/L (ref 0–40)
Albumin/Globulin Ratio: 1.9 (ref 1.2–2.2)
Albumin: 4.6 g/dL (ref 3.6–4.6)
Alkaline Phosphatase: 103 IU/L (ref 44–121)
BUN/Creatinine Ratio: 19 (ref 12–28)
BUN: 15 mg/dL (ref 8–27)
Bilirubin Total: 0.4 mg/dL (ref 0.0–1.2)
CO2: 24 mmol/L (ref 20–29)
Calcium: 9.6 mg/dL (ref 8.7–10.3)
Chloride: 101 mmol/L (ref 96–106)
Creatinine, Ser: 0.8 mg/dL (ref 0.57–1.00)
Globulin, Total: 2.4 g/dL (ref 1.5–4.5)
Glucose: 175 mg/dL — ABNORMAL HIGH (ref 70–99)
Potassium: 4.3 mmol/L (ref 3.5–5.2)
Sodium: 139 mmol/L (ref 134–144)
Total Protein: 7 g/dL (ref 6.0–8.5)
eGFR: 71 mL/min/{1.73_m2} (ref >59)

## 2022-04-04 LAB — CBC WITH DIFFERENTIAL/PLATELET
Basophils Absolute: 0.1 10*3/uL (ref 0.0–0.2)
Basos: 1 %
EOS (ABSOLUTE): 0.2 10*3/uL (ref 0.0–0.4)
Eos: 2 %
Hematocrit: 40.4 % (ref 34.0–46.6)
Hemoglobin: 13.6 g/dL (ref 11.1–15.9)
Immature Grans (Abs): 0 10*3/uL (ref 0.0–0.1)
Immature Granulocytes: 0 %
Lymphocytes Absolute: 2.2 10*3/uL (ref 0.7–3.1)
Lymphs: 28 %
MCH: 29.2 pg (ref 26.6–33.0)
MCHC: 33.7 g/dL (ref 31.5–35.7)
MCV: 87 fL (ref 79–97)
Monocytes Absolute: 0.7 10*3/uL (ref 0.1–0.9)
Monocytes: 9 %
Neutrophils Absolute: 4.8 10*3/uL (ref 1.4–7.0)
Neutrophils: 60 %
Platelets: 288 10*3/uL (ref 150–450)
RBC: 4.65 x10E6/uL (ref 3.77–5.28)
RDW: 15 % (ref 11.7–15.4)
WBC: 7.9 10*3/uL (ref 3.4–10.8)

## 2022-04-04 LAB — BRAIN NATRIURETIC PEPTIDE: BNP: 65.1 pg/mL (ref 0.0–100.0)

## 2022-04-04 NOTE — Progress Notes (Signed)
Contacted with her labs report

## 2022-04-04 NOTE — Progress Notes (Signed)
Contacted via [place], but please contact her via phone to ensure she receive below message: ?Good evening Alison Richardson, your labs and imaging have returned: ?- Kidney function, creatinine and eGFR, remains normal, as is liver function, AST and ALT.   ?- CBC normal with no sign infection or anemia. ?- BNP showing no elevation concerning for acute heart failure, great news!! ?- Chest imaging showed no pneumonia on review of images and report.  For now continue the antibiotic that was sent in and return as scheduled for follow-up.  Any questions? ?Keep being amazing!!  Thank you for allowing me to participate in your care.  I appreciate you. ?Kindest regards, ?Hektor Huston ?

## 2022-04-09 DIAGNOSIS — R2689 Other abnormalities of gait and mobility: Secondary | ICD-10-CM | POA: Diagnosis not present

## 2022-04-09 DIAGNOSIS — R2681 Unsteadiness on feet: Secondary | ICD-10-CM | POA: Diagnosis not present

## 2022-04-09 DIAGNOSIS — A419 Sepsis, unspecified organism: Secondary | ICD-10-CM | POA: Diagnosis not present

## 2022-04-09 DIAGNOSIS — M25511 Pain in right shoulder: Secondary | ICD-10-CM | POA: Diagnosis not present

## 2022-04-09 DIAGNOSIS — R41841 Cognitive communication deficit: Secondary | ICD-10-CM | POA: Diagnosis not present

## 2022-04-09 DIAGNOSIS — Z9181 History of falling: Secondary | ICD-10-CM | POA: Diagnosis not present

## 2022-04-09 DIAGNOSIS — N3000 Acute cystitis without hematuria: Secondary | ICD-10-CM | POA: Diagnosis not present

## 2022-04-09 DIAGNOSIS — G301 Alzheimer's disease with late onset: Secondary | ICD-10-CM | POA: Diagnosis not present

## 2022-04-09 DIAGNOSIS — M6281 Muscle weakness (generalized): Secondary | ICD-10-CM | POA: Diagnosis not present

## 2022-04-10 ENCOUNTER — Telehealth: Payer: Self-pay

## 2022-04-10 NOTE — Chronic Care Management (AMB) (Signed)
?  Care Management  ? ?Outreach Note ? ?04/10/2022 ?Name: Alison Richardson MRN: [id number] DOB: Feb 02, 1933 ? ?Referred by: Jon Billings, NP ?Reason for referral : Care Coordination (Outreach to schedule referral with RN CM ) ? ? ?An unsuccessful telephone outreach was attempted today. The patient was referred to the case management team for assistance with care management and care coordination.  ? ?Follow Up Plan:  ?A HIPAA compliant phone message was left for the patient providing contact information and requesting a return call.  ?The care management team will reach out to the patient again over the next 5 days.  ?If patient returns call to provider office, please advise to call [place] * at [phone]* ? ?Noreene Larsson, RMA ?Care Guide, Embedded Care Coordination ?[facility]  Care Management  ?[place], [place] [address] ?Direct Dial: [phone] ?Museum/gallery [email or web address] ?Website: [email or web address]  ? ?

## 2022-04-11 ENCOUNTER — Telehealth: Payer: Self-pay | Admitting: *Deleted

## 2022-04-11 DIAGNOSIS — E119 Type 2 diabetes mellitus without complications: Secondary | ICD-10-CM | POA: Diagnosis not present

## 2022-04-11 DIAGNOSIS — M48062 Spinal stenosis, lumbar region with neurogenic claudication: Secondary | ICD-10-CM | POA: Diagnosis not present

## 2022-04-11 NOTE — Telephone Encounter (Signed)
"  What I need is the amount of money I spent in the last two years in your office." ? ? ?I called and informed her that she can stop by the office to pick up her itemized statement. ?

## 2022-04-12 ENCOUNTER — Encounter: Payer: Self-pay | Admitting: Nurse Practitioner

## 2022-04-12 ENCOUNTER — Ambulatory Visit: Payer: Medicare PPO | Admitting: Nurse Practitioner

## 2022-04-12 VITALS — BP 126/65 | HR 75 | Temp 98.3°F | Wt 176.0 lb

## 2022-04-12 DIAGNOSIS — E1169 Type 2 diabetes mellitus with other specified complication: Secondary | ICD-10-CM

## 2022-04-12 DIAGNOSIS — I5032 Chronic diastolic (congestive) heart failure: Secondary | ICD-10-CM

## 2022-04-12 DIAGNOSIS — A419 Sepsis, unspecified organism: Secondary | ICD-10-CM | POA: Diagnosis not present

## 2022-04-12 DIAGNOSIS — M25511 Pain in right shoulder: Secondary | ICD-10-CM | POA: Diagnosis not present

## 2022-04-12 DIAGNOSIS — M6281 Muscle weakness (generalized): Secondary | ICD-10-CM | POA: Diagnosis not present

## 2022-04-12 DIAGNOSIS — E039 Hypothyroidism, unspecified: Secondary | ICD-10-CM | POA: Diagnosis not present

## 2022-04-12 DIAGNOSIS — R41841 Cognitive communication deficit: Secondary | ICD-10-CM | POA: Diagnosis not present

## 2022-04-12 DIAGNOSIS — E782 Mixed hyperlipidemia: Secondary | ICD-10-CM | POA: Diagnosis not present

## 2022-04-12 DIAGNOSIS — Z9181 History of falling: Secondary | ICD-10-CM | POA: Diagnosis not present

## 2022-04-12 DIAGNOSIS — R2681 Unsteadiness on feet: Secondary | ICD-10-CM | POA: Diagnosis not present

## 2022-04-12 DIAGNOSIS — R2689 Other abnormalities of gait and mobility: Secondary | ICD-10-CM | POA: Diagnosis not present

## 2022-04-12 DIAGNOSIS — N3000 Acute cystitis without hematuria: Secondary | ICD-10-CM | POA: Diagnosis not present

## 2022-04-12 DIAGNOSIS — G301 Alzheimer's disease with late onset: Secondary | ICD-10-CM | POA: Diagnosis not present

## 2022-04-12 NOTE — Assessment & Plan Note (Signed)
Chronic, ongoing with current ongoing cough.  Continue to collaborate with cardiology.  With her diabetes may benefit from addition of Jardiance or Farxiga for heart if kidney function allows.  Recommend: - Reminded to call for an overnight weight gain of >2 pounds or a weekly weight gain of >5 pounds - not adding salt to food and read food labels. Reviewed the importance of keeping daily sodium intake to <2000mg daily. - Avoid Ibuprofen products. 

## 2022-04-12 NOTE — Progress Notes (Signed)
? ?BP 126/65   Pulse 75   Temp 98.3 ?F (36.8 ?C) (Oral)   Wt 176 lb (79.8 kg)   SpO2 98%   BMI 28.42 kg/m?   ? ?Subjective:  ? ? Patient ID: Alison Richardson, female    DOB: Sep 06, 1933, 88 y.o.   MRN: [id number] ? ?HPI: ?Alison Richardson is a 88 y.o. female ? ?Chief Complaint  ?Patient presents with  ? Hyperlipidemia  ? Hypertension  ? Diabetes  ? ?COUGH ?Was initially treated on 03/19/22 -- was treated with Bactrim (had UTI at time as well), Allegra, Tessalon and Albuterol.  She is currently taking Singulair and Zyrtec, no Allegra.  Was tested for Covid and Flu + strep = these were negative.  Had CXR which showed no acute findings. ? ?Cough is still lingering,  but she is beginning to feel better.  On Monday was the first time she really felt like she was improving.  Still having occasional spasms with this.  Clear phlegm -- no blood or foamy appearance.  Sleeps on 2 pillows at night, no increase, denies orthopnea.  Followed by cardiology, last seen Dr. Corky Sox 11/23/21. ?Duration: weeks ?Circumstances of initial development of cough: URI ?Cough severity: mild ?Cough description: productive and irritating ?Aggravating factors:  worse in the AM ?Alleviating factors: Nyquil and cough syrup ?Status:  fluctuating ?Treatments attempted: cold/sinus and mucinex and abx ?Wheezing: yes ?Shortness of breath: yes -- mild  ?Chest pain: no ?Chest tightness:no ?Nasal congestion: yes ?Runny nose: yes ?Postnasal drip: yes ?Frequent throat clearing or swallowing: yes ?Hemoptysis: no ?Fevers: no ?Night sweats: no ?Weight loss: no ?Heartburn: no ?Recent foreign travel: no ?Tuberculosis contacts: no  ? ?INSOMNIA ?Patient states her sleeping has been better.  Patient has been drinking tea in the dining room in the evenings and stopped drinking it and has been sleeping better.  ?Duration: chronic ?Satisfied with sleep quality: no ?Difficulty falling asleep: no ?Difficulty staying asleep: yes ?Waking a few hours after sleep onset: yes ?Early  morning awakenings: no ?Daytime hypersomnolence: no ?Wakes feeling refreshed: yes ?Good sleep hygiene: yes ?Apnea: no ?Snoring: yes ?Depressed/anxious mood: yes ?Recent stress: no ?Restless legs/nocturnal leg cramps: no ?Chronic pain/arthritis: yes ?History of sleep study: yes ?Treatments attempted: melatonin and benadryl, Trazodone  ? ?Relevant past medical, surgical, family and social history reviewed and updated as indicated. Interim medical history since our last visit reviewed. ?Allergies and medications reviewed and updated. ? ?Review of Systems  ?Constitutional:  Negative for activity change, appetite change, diaphoresis, fatigue and fever.  ?Respiratory:  Positive for cough. Negative for chest tightness, shortness of breath and wheezing.   ?Cardiovascular:  Negative for chest pain, palpitations and leg swelling.  ?Gastrointestinal: Negative.   ?Neurological: Negative.   ?Psychiatric/Behavioral: Negative.  Negative for sleep disturbance.   ? ?Per HPI unless specifically indicated above ? ?   ?Objective:  ?  ?BP 126/65   Pulse 75   Temp 98.3 ?F (36.8 ?C) (Oral)   Wt 176 lb (79.8 kg)   SpO2 98%   BMI 28.42 kg/m?   ?Wt Readings from Last 3 Encounters:  ?04/12/22 176 lb (79.8 kg)  ?04/03/22 177 lb (80.3 kg)  ?03/19/22 176 lb (79.8 kg)  ?  ?Physical Exam ?Vitals and nursing note reviewed.  ?Constitutional:   ?   General: She is awake. She is not in acute distress. ?   Appearance: She is well-developed and well-groomed. She is not ill-appearing or toxic-appearing.  ?HENT:  ?   Head: Normocephalic.  ?  Right Ear: Hearing, ear canal and external ear normal. A middle ear effusion is present.  ?   Left Ear: Hearing, ear canal and external ear normal. A middle ear effusion is present.  ?   Nose: Congestion present. No rhinorrhea.  ?   Right Sinus: No maxillary sinus tenderness or frontal sinus tenderness.  ?   Left Sinus: No maxillary sinus tenderness or frontal sinus tenderness.  ?   Mouth/Throat:  ?   Mouth:  Mucous membranes are moist.  ?   Pharynx: No pharyngeal swelling, oropharyngeal exudate or posterior oropharyngeal erythema (mild with cobblestone appearance).  ?Eyes:  ?   General: Lids are normal.     ?   Right eye: No discharge.     ?   Left eye: No discharge.  ?   Conjunctiva/sclera: Conjunctivae normal.  ?   Pupils: Pupils are equal, round, and reactive to light.  ?Neck:  ?   Thyroid: No thyromegaly.  ?   Vascular: No carotid bruit or JVD.  ?Cardiovascular:  ?   Rate and Rhythm: Normal rate and regular rhythm.  ?   Heart sounds: Normal heart sounds. No murmur heard. ?  No gallop. No S3 sounds.  ?Pulmonary:  ?   Effort: Pulmonary effort is normal. No accessory muscle usage or respiratory distress.  ?   Breath sounds: No wheezing.  ?   Comments: Scattered wheezes noted throughout, no rhonchi. ?Abdominal:  ?   General: Bowel sounds are normal. There is no distension.  ?   Palpations: Abdomen is soft.  ?   Tenderness: There is no abdominal tenderness.  ?Musculoskeletal:  ?   Cervical back: Normal range of motion and neck supple.  ?   Right lower leg: No edema.  ?   Left lower leg: No edema.  ?Lymphadenopathy:  ?   Cervical: No cervical adenopathy.  ?Skin: ?   General: Skin is warm and dry.  ?Neurological:  ?   Mental Status: She is alert and oriented to person, place, and time.  ?Psychiatric:     ?   Attention and Perception: Attention normal.     ?   Mood and Affect: Mood normal.     ?   Speech: Speech normal.     ?   Behavior: Behavior normal. Behavior is cooperative.     ?   Thought Content: Thought content normal.  ? ? ?Results for orders placed or performed in visit on 04/03/22  ?CBC with Differential/Platelet  ?Result Value Ref Range  ? WBC 7.9 3.4 - 10.8 x10E3/uL  ? RBC 4.65 3.77 - 5.28 x10E6/uL  ? Hemoglobin 13.6 11.1 - 15.9 g/dL  ? Hematocrit 40.4 34.0 - 46.6 %  ? MCV 87 79 - 97 fL  ? MCH 29.2 26.6 - 33.0 pg  ? MCHC 33.7 31.5 - 35.7 g/dL  ? RDW 15.0 11.7 - 15.4 %  ? Platelets 288 150 - 450 x10E3/uL  ?  Neutrophils 60 Not Estab. %  ? Lymphs 28 Not Estab. %  ? Monocytes 9 Not Estab. %  ? Eos 2 Not Estab. %  ? Basos 1 Not Estab. %  ? Neutrophils Absolute 4.8 1.4 - 7.0 x10E3/uL  ? Lymphocytes Absolute 2.2 0.7 - 3.1 x10E3/uL  ? Monocytes Absolute 0.7 0.1 - 0.9 x10E3/uL  ? EOS (ABSOLUTE) 0.2 0.0 - 0.4 x10E3/uL  ? Basophils Absolute 0.1 0.0 - 0.2 x10E3/uL  ? Immature Granulocytes 0 Not Estab. %  ? Immature Grans (Abs) 0.0 0.0 -  0.1 x10E3/uL  ?Comprehensive metabolic panel  ?Result Value Ref Range  ? Glucose 175 (H) 70 - 99 mg/dL  ? BUN 15 8 - 27 mg/dL  ? Creatinine, Ser 0.80 0.57 - 1.00 mg/dL  ? eGFR 71 >59 mL/min/1.73  ? BUN/Creatinine Ratio 19 12 - 28  ? Sodium 139 134 - 144 mmol/L  ? Potassium 4.3 3.5 - 5.2 mmol/L  ? Chloride 101 96 - 106 mmol/L  ? CO2 24 20 - 29 mmol/L  ? Calcium 9.6 8.7 - 10.3 mg/dL  ? Total Protein 7.0 6.0 - 8.5 g/dL  ? Albumin 4.6 3.6 - 4.6 g/dL  ? Globulin, Total 2.4 1.5 - 4.5 g/dL  ? Albumin/Globulin Ratio 1.9 1.2 - 2.2  ? Bilirubin Total 0.4 0.0 - 1.2 mg/dL  ? Alkaline Phosphatase 103 44 - 121 IU/L  ? AST 20 0 - 40 IU/L  ? ALT 23 0 - 32 IU/L  ?B Nat Peptide  ?Result Value Ref Range  ? BNP 65.1 0.0 - 100.0 pg/mL  ? ?   ?Assessment & Plan:  ? ?Problem List Items Addressed This Visit   ? ?  ? Cardiovascular and Mediastinum  ? Chronic diastolic CHF (congestive heart failure) ([place]) - Primary  ?  Chronic, ongoing with current ongoing cough.  Continue to collaborate with cardiology.  With her diabetes may benefit from addition of Jardiance or [place] for heart if kidney function allows.  Recommend: ?- Reminded to call for an overnight weight gain of >2 pounds or a weekly weight gain of >5 pounds ?- not adding salt to food and read food labels. Reviewed the importance of keeping daily sodium intake to <2042m daily. ?- Avoid Ibuprofen products. ?  ?  ? Relevant Orders  ? Comp Met (CMET)  ?  ? Endocrine  ? Hypothyroidism  ?  Labs ordered today. Will make recommendations based on lab results. ? ?  ?  ?  Relevant Orders  ? TSH  ? T4, free  ? DM type 2 with diabetic mixed hyperlipidemia ([place]  ?  Chronic. Labs ordered today.  May benefit from [place] to HF.  Will make recommendations based on lab result

## 2022-04-12 NOTE — Assessment & Plan Note (Signed)
Chronic. Labs ordered today.  May benefit from Farxiga or Jardiance due to HF.  Will make recommendations based on lab results. 

## 2022-04-12 NOTE — Assessment & Plan Note (Signed)
Labs ordered today.  Will make recommendations based on lab results. ?

## 2022-04-13 ENCOUNTER — Telehealth: Payer: Self-pay

## 2022-04-13 LAB — COMPREHENSIVE METABOLIC PANEL
ALT: 17 IU/L (ref 0–32)
AST: 15 IU/L (ref 0–40)
Albumin/Globulin Ratio: 1.6 (ref 1.2–2.2)
Albumin: 4.2 g/dL (ref 3.6–4.6)
Alkaline Phosphatase: 96 IU/L (ref 44–121)
BUN/Creatinine Ratio: 29 — ABNORMAL HIGH (ref 12–28)
BUN: 22 mg/dL (ref 8–27)
Bilirubin Total: 0.2 mg/dL (ref 0.0–1.2)
CO2: 21 mmol/L (ref 20–29)
Calcium: 9.2 mg/dL (ref 8.7–10.3)
Chloride: 103 mmol/L (ref 96–106)
Creatinine, Ser: 0.76 mg/dL (ref 0.57–1.00)
Globulin, Total: 2.6 g/dL (ref 1.5–4.5)
Glucose: 202 mg/dL — ABNORMAL HIGH (ref 70–99)
Potassium: 3.5 mmol/L (ref 3.5–5.2)
Sodium: 143 mmol/L (ref 134–144)
Total Protein: 6.8 g/dL (ref 6.0–8.5)
eGFR: 76 mL/min/{1.73_m2} (ref >59)

## 2022-04-13 LAB — T4, FREE: Free T4: 2.07 ng/dL — ABNORMAL HIGH (ref 0.82–1.77)

## 2022-04-13 LAB — HEMOGLOBIN A1C
Est. average glucose Bld gHb Est-mCnc: 160 mg/dL
Hgb A1c MFr Bld: 7.2 % — ABNORMAL HIGH (ref 4.8–5.6)

## 2022-04-13 LAB — TSH: TSH: 0.797 u[IU]/mL (ref 0.450–4.500)

## 2022-04-13 MED ORDER — LEVOTHYROXINE SODIUM 75 MCG PO TABS: 75.0000 ug | ORAL_TABLET | Freq: Every day | ORAL | 3 refills | Status: DC

## 2022-04-13 NOTE — Telephone Encounter (Signed)
Pt given lab results per notes of Santiago Glad, NP on 04/13/22. Pt verbalized understanding. Appt was made for 05/27/22 at 1100.  ?

## 2022-04-13 NOTE — Progress Notes (Signed)
Please let patient know that her lab came back shows that her thyroid is a little over active.  I am going to decrease her dose of Levothyroxine from 3mg daily to 780m daily.  I would like her to come back in 6 weeks to have this rechecked.

## 2022-04-13 NOTE — Addendum Note (Signed)
Addended by: Jon Billings on: 04/13/2022 01:03 PM ? ? Modules accepted: Orders ? ?

## 2022-04-16 DIAGNOSIS — I2510 Atherosclerotic heart disease of native coronary artery without angina pectoris: Secondary | ICD-10-CM | POA: Diagnosis not present

## 2022-04-16 DIAGNOSIS — I4891 Unspecified atrial fibrillation: Secondary | ICD-10-CM | POA: Diagnosis not present

## 2022-04-16 DIAGNOSIS — I5023 Acute on chronic systolic (congestive) heart failure: Secondary | ICD-10-CM | POA: Diagnosis not present

## 2022-04-16 DIAGNOSIS — G4733 Obstructive sleep apnea (adult) (pediatric): Secondary | ICD-10-CM | POA: Diagnosis not present

## 2022-04-16 DIAGNOSIS — I4892 Unspecified atrial flutter: Secondary | ICD-10-CM | POA: Diagnosis not present

## 2022-04-19 DIAGNOSIS — Z9181 History of falling: Secondary | ICD-10-CM | POA: Diagnosis not present

## 2022-04-19 DIAGNOSIS — N3000 Acute cystitis without hematuria: Secondary | ICD-10-CM | POA: Diagnosis not present

## 2022-04-19 DIAGNOSIS — A419 Sepsis, unspecified organism: Secondary | ICD-10-CM | POA: Diagnosis not present

## 2022-04-19 DIAGNOSIS — R2689 Other abnormalities of gait and mobility: Secondary | ICD-10-CM | POA: Diagnosis not present

## 2022-04-19 DIAGNOSIS — G301 Alzheimer's disease with late onset: Secondary | ICD-10-CM | POA: Diagnosis not present

## 2022-04-19 DIAGNOSIS — R2681 Unsteadiness on feet: Secondary | ICD-10-CM | POA: Diagnosis not present

## 2022-04-19 DIAGNOSIS — M25511 Pain in right shoulder: Secondary | ICD-10-CM | POA: Diagnosis not present

## 2022-04-19 DIAGNOSIS — M6281 Muscle weakness (generalized): Secondary | ICD-10-CM | POA: Diagnosis not present

## 2022-04-23 DIAGNOSIS — R2689 Other abnormalities of gait and mobility: Secondary | ICD-10-CM | POA: Diagnosis not present

## 2022-04-23 DIAGNOSIS — A419 Sepsis, unspecified organism: Secondary | ICD-10-CM | POA: Diagnosis not present

## 2022-04-23 DIAGNOSIS — R2681 Unsteadiness on feet: Secondary | ICD-10-CM | POA: Diagnosis not present

## 2022-04-23 DIAGNOSIS — Z9181 History of falling: Secondary | ICD-10-CM | POA: Diagnosis not present

## 2022-04-23 DIAGNOSIS — G301 Alzheimer's disease with late onset: Secondary | ICD-10-CM | POA: Diagnosis not present

## 2022-04-23 DIAGNOSIS — M6281 Muscle weakness (generalized): Secondary | ICD-10-CM | POA: Diagnosis not present

## 2022-04-26 DIAGNOSIS — R2689 Other abnormalities of gait and mobility: Secondary | ICD-10-CM | POA: Diagnosis not present

## 2022-04-26 DIAGNOSIS — R2681 Unsteadiness on feet: Secondary | ICD-10-CM | POA: Diagnosis not present

## 2022-04-26 DIAGNOSIS — Z9181 History of falling: Secondary | ICD-10-CM | POA: Diagnosis not present

## 2022-04-26 DIAGNOSIS — M25511 Pain in right shoulder: Secondary | ICD-10-CM | POA: Diagnosis not present

## 2022-04-26 DIAGNOSIS — G301 Alzheimer's disease with late onset: Secondary | ICD-10-CM | POA: Diagnosis not present

## 2022-04-26 DIAGNOSIS — M6281 Muscle weakness (generalized): Secondary | ICD-10-CM | POA: Diagnosis not present

## 2022-04-26 DIAGNOSIS — N3000 Acute cystitis without hematuria: Secondary | ICD-10-CM | POA: Diagnosis not present

## 2022-04-26 DIAGNOSIS — A419 Sepsis, unspecified organism: Secondary | ICD-10-CM | POA: Diagnosis not present

## 2022-04-27 NOTE — Chronic Care Management (AMB) (Signed)
?  Chronic Care Management  ? ?Outreach Note ? ?04/27/2022 ?Name: Kionna Brier MRN: [id number] DOB: 1933-05-07 ? ?Alison Richardson is a 88 y.o. year old female who is a primary care patient of Jon Billings, NP. I reached out to Moshe Salisbury by phone today in response to a referral sent by Ms. Durenda Guthrie primary care provider. ? ?A second unsuccessful telephone outreach was attempted today. The patient was referred to the case management team for assistance with care management and care coordination.  ? ?Follow Up Plan: A HIPAA compliant phone message was left for the patient providing contact information and requesting a return call.  ?The care management team will reach out to the patient again over the next 7 days.  ?If patient returns call to provider office, please advise to call [place] * at [phone]* ? ?Noreene Larsson, RMA ?Care Guide, Embedded Care Coordination ?[facility]  Care Management  ?[place], [place] [address] ?Direct Dial: [phone] ?Museum/[email or web address] ?Website: [email or web address]  ? ?

## 2022-04-28 ENCOUNTER — Other Ambulatory Visit: Payer: Self-pay | Admitting: Nurse Practitioner

## 2022-04-30 DIAGNOSIS — R2689 Other abnormalities of gait and mobility: Secondary | ICD-10-CM | POA: Diagnosis not present

## 2022-04-30 DIAGNOSIS — N3000 Acute cystitis without hematuria: Secondary | ICD-10-CM | POA: Diagnosis not present

## 2022-04-30 DIAGNOSIS — M25511 Pain in right shoulder: Secondary | ICD-10-CM | POA: Diagnosis not present

## 2022-04-30 DIAGNOSIS — R2681 Unsteadiness on feet: Secondary | ICD-10-CM | POA: Diagnosis not present

## 2022-04-30 DIAGNOSIS — Z9181 History of falling: Secondary | ICD-10-CM | POA: Diagnosis not present

## 2022-04-30 DIAGNOSIS — G301 Alzheimer's disease with late onset: Secondary | ICD-10-CM | POA: Diagnosis not present

## 2022-04-30 DIAGNOSIS — A419 Sepsis, unspecified organism: Secondary | ICD-10-CM | POA: Diagnosis not present

## 2022-04-30 DIAGNOSIS — M6281 Muscle weakness (generalized): Secondary | ICD-10-CM | POA: Diagnosis not present

## 2022-04-30 NOTE — Telephone Encounter (Signed)
Requested Prescriptions  ?Pending Prescriptions Disp Refills  ?? metoprolol succinate (TOPROL-XL) 100 MG 24 hr tablet [Pharmacy Med Name: METOPROLOL SUCCINATE ER 100 MG TAB] 90 tablet 1  ?  Sig: TAKE ONE TABLET (100 MG) BY MOUTH EVERY DAY (TAKE WITH OR IMMEDIATELY AFTER A MEAL)  ?  ? Cardiovascular:  Beta Blockers Passed - 04/28/2022  1:25 PM  ?  ?  Passed - Last BP in normal range  ?  BP Readings from Last 1 Encounters:  ?04/12/22 126/65  ?   ?  ?  Passed - Last Heart Rate in normal range  ?  Pulse Readings from Last 1 Encounters:  ?04/12/22 75  ?   ?  ?  Passed - Valid encounter within last 6 months  ?  Recent Outpatient Visits   ?      ? 2 weeks ago Chronic diastolic CHF (congestive heart failure) ([place])  ? [place], NP  ? 3 weeks ago Chronic diastolic CHF (congestive heart failure) ([place])  ? [place], [place] T, NP  ? 1 month ago Upper respiratory tract infection, unspecified type  ? [facility] Vigg, [place], MD  ? 1 month ago Benign essential hypertension  ? [place], NP  ? 2 months ago Acute cystitis with hematuria  ? [facility] Jon Billings, NP  ?  ?  ?Future Appointments   ?        ? In 2 months Jon Billings, NP [facility]  ?  ? ?  ?  ?  ? ? ?

## 2022-05-03 DIAGNOSIS — A419 Sepsis, unspecified organism: Secondary | ICD-10-CM | POA: Diagnosis not present

## 2022-05-03 DIAGNOSIS — M6281 Muscle weakness (generalized): Secondary | ICD-10-CM | POA: Diagnosis not present

## 2022-05-03 DIAGNOSIS — Z9181 History of falling: Secondary | ICD-10-CM | POA: Diagnosis not present

## 2022-05-03 DIAGNOSIS — N3000 Acute cystitis without hematuria: Secondary | ICD-10-CM | POA: Diagnosis not present

## 2022-05-03 DIAGNOSIS — R2689 Other abnormalities of gait and mobility: Secondary | ICD-10-CM | POA: Diagnosis not present

## 2022-05-03 DIAGNOSIS — R2681 Unsteadiness on feet: Secondary | ICD-10-CM | POA: Diagnosis not present

## 2022-05-03 DIAGNOSIS — G301 Alzheimer's disease with late onset: Secondary | ICD-10-CM | POA: Diagnosis not present

## 2022-05-03 DIAGNOSIS — M25511 Pain in right shoulder: Secondary | ICD-10-CM | POA: Diagnosis not present

## 2022-05-09 NOTE — Chronic Care Management (AMB) (Signed)
?  Care Management  ? ?Outreach Note ? ?05/09/2022 ?Name: Alison Richardson MRN: [id number] DOB: 1933/03/27 ? ?Referred by: Jon Billings, NP ?Reason for referral : Care Coordination (Outreach to schedule referral with RN CM ) ? ? ?Third unsuccessful telephone outreach was attempted today. The patient was referred to the case management team for assistance with care management and care coordination. The patient's primary care provider has been notified of our unsuccessful attempts to make or maintain contact with the patient. The care management team is pleased to engage with this patient at any time in the future should he/she be interested in assistance from the care management team.  ? ?Follow Up Plan:  ?We have been unable to make contact with the patient for follow up. The care management team is available to follow up with the patient after provider conversation with the patient regarding recommendation for care management engagement and subsequent re-referral to the care management team.  ? ?Noreene Larsson, RMA ?Care Guide, Embedded Care Coordination ?[facility]  Care Management  ?[place], [place] [address] ?Direct Dial: [phone] ?Museum/gallery [email or web address] ?[email or web address]: [email or web address]  ? ?

## 2022-05-15 ENCOUNTER — Inpatient Hospital Stay
Admission: EM | Admit: 2022-05-15 | Discharge: 2022-05-17 | DRG: 563 | Disposition: A | Discharge status: Skilled Nursing Facility | Payer: Medicare PPO | Attending: Internal Medicine | Admitting: Internal Medicine

## 2022-05-15 ENCOUNTER — Other Ambulatory Visit: Payer: Self-pay

## 2022-05-15 ENCOUNTER — Emergency Department: Payer: Medicare PPO

## 2022-05-15 ENCOUNTER — Other Ambulatory Visit: Payer: Self-pay | Admitting: Internal Medicine

## 2022-05-15 DIAGNOSIS — I482 Chronic atrial fibrillation, unspecified: Secondary | ICD-10-CM | POA: Diagnosis present

## 2022-05-15 DIAGNOSIS — S42213A Unspecified displaced fracture of surgical neck of unspecified humerus, initial encounter for closed fracture: Secondary | ICD-10-CM | POA: Diagnosis not present

## 2022-05-15 DIAGNOSIS — W010XXA Fall on same level from slipping, tripping and stumbling without subsequent striking against object, initial encounter: Secondary | ICD-10-CM | POA: Diagnosis present

## 2022-05-15 DIAGNOSIS — E039 Hypothyroidism, unspecified: Secondary | ICD-10-CM | POA: Diagnosis present

## 2022-05-15 DIAGNOSIS — E785 Hyperlipidemia, unspecified: Secondary | ICD-10-CM | POA: Diagnosis present

## 2022-05-15 DIAGNOSIS — E1159 Type 2 diabetes mellitus with other circulatory complications: Secondary | ICD-10-CM | POA: Diagnosis present

## 2022-05-15 DIAGNOSIS — I4892 Unspecified atrial flutter: Secondary | ICD-10-CM | POA: Diagnosis not present

## 2022-05-15 DIAGNOSIS — R52 Pain, unspecified: Secondary | ICD-10-CM | POA: Diagnosis present

## 2022-05-15 DIAGNOSIS — I6381 Other cerebral infarction due to occlusion or stenosis of small artery: Secondary | ICD-10-CM | POA: Diagnosis not present

## 2022-05-15 DIAGNOSIS — Z888 Allergy status to other drugs, medicaments and biological substances status: Secondary | ICD-10-CM

## 2022-05-15 DIAGNOSIS — Z9181 History of falling: Secondary | ICD-10-CM | POA: Diagnosis not present

## 2022-05-15 DIAGNOSIS — R609 Edema, unspecified: Secondary | ICD-10-CM | POA: Diagnosis not present

## 2022-05-15 DIAGNOSIS — S42211A Unspecified displaced fracture of surgical neck of right humerus, initial encounter for closed fracture: Secondary | ICD-10-CM | POA: Diagnosis present

## 2022-05-15 DIAGNOSIS — F0280 Dementia in other diseases classified elsewhere without behavioral disturbance: Secondary | ICD-10-CM | POA: Diagnosis not present

## 2022-05-15 DIAGNOSIS — E781 Pure hyperglyceridemia: Secondary | ICD-10-CM

## 2022-05-15 DIAGNOSIS — Z66 Do not resuscitate: Secondary | ICD-10-CM | POA: Diagnosis present

## 2022-05-15 DIAGNOSIS — Z043 Encounter for examination and observation following other accident: Secondary | ICD-10-CM | POA: Diagnosis not present

## 2022-05-15 DIAGNOSIS — I739 Peripheral vascular disease, unspecified: Secondary | ICD-10-CM | POA: Diagnosis not present

## 2022-05-15 DIAGNOSIS — Z7902 Long term (current) use of antithrombotics/antiplatelets: Secondary | ICD-10-CM | POA: Diagnosis not present

## 2022-05-15 DIAGNOSIS — E1142 Type 2 diabetes mellitus with diabetic polyneuropathy: Secondary | ICD-10-CM

## 2022-05-15 DIAGNOSIS — F419 Anxiety disorder, unspecified: Secondary | ICD-10-CM | POA: Diagnosis not present

## 2022-05-15 DIAGNOSIS — R0602 Shortness of breath: Secondary | ICD-10-CM | POA: Diagnosis not present

## 2022-05-15 DIAGNOSIS — I10 Essential (primary) hypertension: Secondary | ICD-10-CM | POA: Diagnosis not present

## 2022-05-15 DIAGNOSIS — Y92009 Unspecified place in unspecified non-institutional (private) residence as the place of occurrence of the external cause: Secondary | ICD-10-CM

## 2022-05-15 DIAGNOSIS — S299XXA Unspecified injury of thorax, initial encounter: Secondary | ICD-10-CM | POA: Diagnosis not present

## 2022-05-15 DIAGNOSIS — I152 Hypertension secondary to endocrine disorders: Secondary | ICD-10-CM | POA: Diagnosis present

## 2022-05-15 DIAGNOSIS — K219 Gastro-esophageal reflux disease without esophagitis: Secondary | ICD-10-CM | POA: Diagnosis present

## 2022-05-15 DIAGNOSIS — Z7901 Long term (current) use of anticoagulants: Secondary | ICD-10-CM

## 2022-05-15 DIAGNOSIS — Z79890 Hormone replacement therapy (postmenopausal): Secondary | ICD-10-CM

## 2022-05-15 DIAGNOSIS — G4733 Obstructive sleep apnea (adult) (pediatric): Secondary | ICD-10-CM | POA: Diagnosis present

## 2022-05-15 DIAGNOSIS — R000 Tachycardia, unspecified: Secondary | ICD-10-CM | POA: Diagnosis not present

## 2022-05-15 DIAGNOSIS — S42211D Unspecified displaced fracture of surgical neck of right humerus, subsequent encounter for fracture with routine healing: Secondary | ICD-10-CM | POA: Diagnosis not present

## 2022-05-15 DIAGNOSIS — Z96652 Presence of left artificial knee joint: Secondary | ICD-10-CM | POA: Diagnosis present

## 2022-05-15 DIAGNOSIS — M150 Primary generalized (osteo)arthritis: Secondary | ICD-10-CM | POA: Diagnosis present

## 2022-05-15 DIAGNOSIS — I700 Atherosclerosis of aorta: Secondary | ICD-10-CM | POA: Diagnosis not present

## 2022-05-15 DIAGNOSIS — Z85828 Personal history of other malignant neoplasm of skin: Secondary | ICD-10-CM | POA: Diagnosis not present

## 2022-05-15 DIAGNOSIS — S42251A Displaced fracture of greater tuberosity of right humerus, initial encounter for closed fracture: Secondary | ICD-10-CM

## 2022-05-15 DIAGNOSIS — Z833 Family history of diabetes mellitus: Secondary | ICD-10-CM | POA: Diagnosis not present

## 2022-05-15 DIAGNOSIS — M159 Polyosteoarthritis, unspecified: Secondary | ICD-10-CM | POA: Diagnosis present

## 2022-05-15 DIAGNOSIS — I2510 Atherosclerotic heart disease of native coronary artery without angina pectoris: Secondary | ICD-10-CM | POA: Diagnosis present

## 2022-05-15 DIAGNOSIS — Z8249 Family history of ischemic heart disease and other diseases of the circulatory system: Secondary | ICD-10-CM | POA: Diagnosis not present

## 2022-05-15 DIAGNOSIS — Z79899 Other long term (current) drug therapy: Secondary | ICD-10-CM

## 2022-05-15 DIAGNOSIS — S42201A Unspecified fracture of upper end of right humerus, initial encounter for closed fracture: Secondary | ICD-10-CM | POA: Diagnosis not present

## 2022-05-15 DIAGNOSIS — R309 Painful micturition, unspecified: Secondary | ICD-10-CM

## 2022-05-15 DIAGNOSIS — E1151 Type 2 diabetes mellitus with diabetic peripheral angiopathy without gangrene: Secondary | ICD-10-CM | POA: Diagnosis present

## 2022-05-15 DIAGNOSIS — F0284 Dementia in other diseases classified elsewhere, unspecified severity, with anxiety: Secondary | ICD-10-CM | POA: Diagnosis present

## 2022-05-15 DIAGNOSIS — Z7984 Long term (current) use of oral hypoglycemic drugs: Secondary | ICD-10-CM

## 2022-05-15 DIAGNOSIS — S6992XA Unspecified injury of left wrist, hand and finger(s), initial encounter: Secondary | ICD-10-CM | POA: Diagnosis not present

## 2022-05-15 DIAGNOSIS — I2583 Coronary atherosclerosis due to lipid rich plaque: Secondary | ICD-10-CM | POA: Diagnosis not present

## 2022-05-15 DIAGNOSIS — J069 Acute upper respiratory infection, unspecified: Secondary | ICD-10-CM

## 2022-05-15 DIAGNOSIS — G301 Alzheimer's disease with late onset: Secondary | ICD-10-CM | POA: Diagnosis present

## 2022-05-15 DIAGNOSIS — I471 Supraventricular tachycardia: Secondary | ICD-10-CM | POA: Diagnosis not present

## 2022-05-15 DIAGNOSIS — E1169 Type 2 diabetes mellitus with other specified complication: Secondary | ICD-10-CM | POA: Diagnosis present

## 2022-05-15 DIAGNOSIS — M25519 Pain in unspecified shoulder: Secondary | ICD-10-CM | POA: Diagnosis not present

## 2022-05-15 DIAGNOSIS — G4730 Sleep apnea, unspecified: Secondary | ICD-10-CM | POA: Diagnosis present

## 2022-05-15 DIAGNOSIS — S42309A Unspecified fracture of shaft of humerus, unspecified arm, initial encounter for closed fracture: Secondary | ICD-10-CM | POA: Diagnosis present

## 2022-05-15 DIAGNOSIS — I5033 Acute on chronic diastolic (congestive) heart failure: Secondary | ICD-10-CM | POA: Diagnosis not present

## 2022-05-15 DIAGNOSIS — I252 Old myocardial infarction: Secondary | ICD-10-CM

## 2022-05-15 DIAGNOSIS — I4891 Unspecified atrial fibrillation: Secondary | ICD-10-CM | POA: Diagnosis not present

## 2022-05-15 LAB — CBC WITH DIFFERENTIAL/PLATELET
Abs Immature Granulocytes: 0.07 10*3/uL (ref 0.00–0.07)
Basophils Absolute: 0.1 10*3/uL (ref 0.0–0.1)
Basophils Relative: 1 %
Eosinophils Absolute: 0.2 10*3/uL (ref 0.0–0.5)
Eosinophils Relative: 2 %
HCT: 43.5 % (ref 36.0–46.0)
Hemoglobin: 13.5 g/dL (ref 12.0–15.0)
Immature Granulocytes: 1 %
Lymphocytes Relative: 30 %
Lymphs Abs: 2.9 10*3/uL (ref 0.7–4.0)
MCH: 28.7 pg (ref 26.0–34.0)
MCHC: 31 g/dL (ref 30.0–36.0)
MCV: 92.4 fL (ref 80.0–100.0)
Monocytes Absolute: 0.8 10*3/uL (ref 0.1–1.0)
Monocytes Relative: 8 %
Neutro Abs: 5.7 10*3/uL (ref 1.7–7.7)
Neutrophils Relative %: 58 %
Platelets: 305 10*3/uL (ref 150–400)
RBC: 4.71 MIL/uL (ref 3.87–5.11)
RDW: 14.3 % (ref 11.5–15.5)
WBC: 9.8 10*3/uL (ref 4.0–10.5)
nRBC: 0 % (ref 0.0–0.2)

## 2022-05-15 LAB — COMPREHENSIVE METABOLIC PANEL
ALT: 21 U/L (ref 0–44)
AST: 29 U/L (ref 15–41)
Albumin: 3.8 g/dL (ref 3.5–5.0)
Alkaline Phosphatase: 98 U/L (ref 38–126)
Anion gap: 14 (ref 5–15)
BUN: 21 mg/dL (ref 8–23)
CO2: 21 mmol/L — ABNORMAL LOW (ref 22–32)
Calcium: 8.8 mg/dL — ABNORMAL LOW (ref 8.9–10.3)
Chloride: 106 mmol/L (ref 98–111)
Creatinine, Ser: 1.24 mg/dL — ABNORMAL HIGH (ref 0.44–1.00)
GFR, Estimated: 42 mL/min — ABNORMAL LOW (ref >60)
Glucose, Bld: 243 mg/dL — ABNORMAL HIGH (ref 70–99)
Potassium: 4.1 mmol/L (ref 3.5–5.1)
Sodium: 141 mmol/L (ref 135–145)
Total Bilirubin: 0.7 mg/dL (ref 0.3–1.2)
Total Protein: 6.5 g/dL (ref 6.5–8.1)

## 2022-05-15 MED ORDER — ENOXAPARIN SODIUM 40 MG/0.4ML IJ SOSY: 40.0000 mg | PREFILLED_SYRINGE | INTRAMUSCULAR | Status: DC

## 2022-05-15 MED ORDER — NITROGLYCERIN 0.4 MG SL SUBL: 0.4000 mg | SUBLINGUAL_TABLET | SUBLINGUAL | Status: DC | PRN

## 2022-05-15 MED ORDER — HYDROMORPHONE HCL 1 MG/ML IJ SOLN
0.5000 mg | Freq: Once | INTRAMUSCULAR | Status: AC
  Administered 2022-05-15: 0.5 mg via INTRAVENOUS
  Filled 2022-05-15: qty 0.5

## 2022-05-15 MED ORDER — MORPHINE SULFATE (PF) 4 MG/ML IV SOLN
4.0000 mg | INTRAVENOUS | Status: DC | PRN
  Administered 2022-05-16: 4 mg via INTRAVENOUS
  Filled 2022-05-15: qty 1

## 2022-05-15 MED ORDER — LEVOTHYROXINE SODIUM 50 MCG PO TABS
75.0000 ug | ORAL_TABLET | Freq: Every day | ORAL | Status: DC
  Administered 2022-05-16 – 2022-05-17 (×2): 75 ug via ORAL
  Filled 2022-05-15 (×2): qty 1

## 2022-05-15 MED ORDER — ACETAMINOPHEN 325 MG PO TABS: 650.0000 mg | ORAL_TABLET | Freq: Four times a day (QID) | ORAL | Status: DC | PRN

## 2022-05-15 MED ORDER — ONDANSETRON HCL 4 MG PO TABS: 4.0000 mg | ORAL_TABLET | Freq: Four times a day (QID) | ORAL | Status: DC | PRN

## 2022-05-15 MED ORDER — PRAVASTATIN SODIUM 20 MG PO TABS
20.0000 mg | ORAL_TABLET | Freq: Every day | ORAL | Status: DC
  Administered 2022-05-16 (×2): 20 mg via ORAL
  Filled 2022-05-15 (×2): qty 1

## 2022-05-15 MED ORDER — ONDANSETRON HCL 4 MG/2ML IJ SOLN: 4.0000 mg | Freq: Four times a day (QID) | INTRAMUSCULAR | Status: DC | PRN

## 2022-05-15 MED ORDER — ACETAMINOPHEN 650 MG RE SUPP: 650.0000 mg | Freq: Four times a day (QID) | RECTAL | Status: DC | PRN

## 2022-05-15 MED ORDER — HYDROMORPHONE HCL 1 MG/ML IJ SOLN: 0.5000 mg | INTRAMUSCULAR | Status: DC | PRN

## 2022-05-15 MED ORDER — HYDROMORPHONE HCL 1 MG/ML IJ SOLN
0.5000 mg | Freq: Once | INTRAMUSCULAR | Status: AC
Start: 2022-05-15 — End: 2022-05-15
  Administered 2022-05-15: 0.5 mg via INTRAVENOUS
  Filled 2022-05-15: qty 0.5

## 2022-05-15 MED ORDER — ONDANSETRON HCL 4 MG/2ML IJ SOLN
4.0000 mg | Freq: Once | INTRAMUSCULAR | Status: AC
  Administered 2022-05-15: 4 mg via INTRAVENOUS
  Filled 2022-05-15: qty 2

## 2022-05-15 NOTE — H&P (Incomplete)
History and Physical   Alison Richardson XFG:[id number] DOB: 1933/04/26 DOA: 05/15/2022  PCP: Jon Billings, NP (Confirm with patient/family/NH records and if not entered, this has to be entered at [facility] point of entry) Outpatient Specialists: *** [facility] speciality and name if known) Patient coming from: ***  I have personally briefly reviewed patient's old medical records in [place].  Chief Concern: ***  HPI: No notes on file    Social history: ***  Vaccination history: ***  (The initial 2-3 lines should be focused and good to copy and paste in the HPI section of the daily progress note).   (For level 3, the HPI must include 4+ descriptors: Location, Quality, Severity, Duration, Timing, Context, modifying factors, associated signs/symptoms and/or status of 3+ chronic problems.)   ROS:*** Constitutional: no weight change, no fever ENT/Mouth: no sore throat, no rhinorrhea Eyes: no eye pain, no vision changes Cardiovascular: no chest pain, no dyspnea,  no edema, no palpitations Respiratory: no cough, no sputum, no wheezing Gastrointestinal: no nausea, no vomiting, no diarrhea, no constipation Genitourinary: no urinary incontinence, no dysuria, no hematuria Musculoskeletal: no arthralgias, no myalgias Skin: no skin lesions, no pruritus, Neuro: + weakness, no loss of consciousness, no syncope Psych: no anxiety, no depression, + decrease appetite Heme/Lymph: no bruising, no bleeding  ED Course: ***  Assessment/Plan  Principal Problem:   Intractable pain Active Problems:   Anxiety disorder   Hypertension associated with diabetes (HCC)   Hypothyroidism   Obstructive sleep apnea   History of falling   Gastroesophageal reflux disease    Assessment and Plan: No notes have been filed under this hospital service. Service: Hospitalist     *** Chart reviewed.   DVT prophylaxis: ***  Code Status: ***  Diet: *** Family Communication: ***  Disposition Plan:  ***  Consults called: ***  Admission status: ***   Past Medical History:  Diagnosis Date   Arrhythmia    Atrial flutter ([place]) 01/2018   new onset    Basal cell carcinoma of back    Basal cell carcinoma of lip    Cerebral aneurysm    followed by [facility]   CHF (congestive heart failure) (HCC)    DDD (degenerative disc disease), lumbar    superior plate depression, L3 08/18/2014   Diabetes mellitus type II, controlled ([place])    Diverticulosis    Dysrhythmia    Paroxysmal Supraventricular Tachycardia   Dysthymia    depression   GERD (gastroesophageal reflux disease)    History of meniscal tear    Hyperlipidemia    Hypertension    Hypothyroidism    Late onset Alzheimer's disease with behavioral disturbance ([place])    Mild pulmonary hypertension ([place])    Overactive bladder    Peripheral vascular disease ([place])    Seasonal allergic rhinitis    Sleep apnea     Past Surgical History:  Procedure Laterality Date   APPENDECTOMY  1946   BASAL CELL CARCINOMA EXCISION     2006 and 2009 removed from back and lip   CARDIOVASCULAR STRESS TEST  2015   nuclear cardiac stress test negative for ischemia - Dr. Satira Sark   CATARACT EXTRACTION, BILATERAL  2006   COLONOSCOPY  2014   COLONOSCOPY N/A 08/19/2020   Procedure: COLONOSCOPY;  Surgeon: Lesly Rubenstein, MD;  Location: [facility] ENDOSCOPY;  Service: Endoscopy;  Laterality: N/A;   ECTOPIC PREGNANCY SURGERY  1957   ESOPHAGOGASTRODUODENOSCOPY N/A 08/19/2020   Procedure: ESOPHAGOGASTRODUODENOSCOPY (EGD);  Surgeon: Lesly Rubenstein, MD;  Location:  [facility];  Service: Endoscopy;  Laterality: N/A;   INJECTION KNEE Left 05/2015   LEFT HEART CATH AND CORONARY ANGIOGRAPHY N/A 10/12/2021   Procedure: LEFT HEART CATH AND CORONARY ANGIOGRAPHY;  Surgeon: Corey Skains, MD;  Location: [place] [facility];  Service: Cardiovascular;  Laterality: N/A;   REPLACEMENT TOTAL KNEE Left 09/06/2016   Dr. Barnet Pall   TONSILLECTOMY AND ADENOIDECTOMY  1953    TOTAL ABDOMINAL HYSTERECTOMY  1986   [facility]    Social History:  reports that she has never smoked. She has never used smokeless tobacco. She reports current alcohol use of about 3.0 standard drinks per week. She reports that she does not use drugs.  Allergies  Allergen Reactions   Pantoprazole Nausea And Vomiting   Metformin And Related Diarrhea   Family History  Problem Relation Age of Onset   Hypertension Mother    Heart disease Father    CAD Father    Heart disease Sister    Diabetes Sister    Diabetes Paternal Uncle    Family history: Family history reviewed and not pertinent***  Prior to Admission medications   Medication Sig Start Date End Date Taking? Authorizing Provider  albuterol (VENTOLIN HFA) 108 (90 Base) MCG/ACT inhaler Inhale 2 puffs into the lungs every 6 (six) hours as needed for wheezing or shortness of breath. 03/19/22   Vigg, Avanti, MD  amiodarone (PACERONE) 200 MG tablet Take 200 mg by mouth daily.  01/02/19   [provider]  clopidogrel (PLAVIX) 75 MG tablet Take 75 mg by mouth daily.    [provider]  donepezil (ARICEPT) 10 MG tablet Take 10 mg by mouth at bedtime.    [provider]  ELIQUIS 5 MG TABS tablet Take 5 mg by mouth 2 (two) times daily.  05/06/20   [provider]  fexofenadine (ALLEGRA ALLERGY) 180 MG tablet Take 1 tablet (180 mg total) by mouth daily. 03/19/22   Vigg, Avanti, MD  gabapentin (NEURONTIN) 100 MG capsule Take 100 mg by mouth 2 (two) times daily. 05/06/20   [provider]  glipiZIDE (GLUCOTROL XL) 2.5 MG 24 hr tablet Take 2.5 mg by mouth daily. 09/19/21   [provider]  isosorbide mononitrate (IMDUR) 30 MG 24 hr tablet Take 30 mg by mouth daily.    [provider]  levothyroxine (SYNTHROID) 75 MCG tablet Take 1 tablet (75 mcg total) by mouth daily. 04/13/22   Jon Billings, NP  metoprolol succinate (TOPROL-XL) 100 MG 24 hr tablet TAKE ONE TABLET (100 MG) BY MOUTH EVERY  DAY (TAKE WITH OR IMMEDIATELY AFTER A MEAL) 04/30/22   Jon Billings, NP  montelukast (SINGULAIR) 10 MG tablet TAKE ONE TABLET BY MOUTH AT BEDTIME 03/16/22   Glean Hess, MD  nitroGLYCERIN (NITROSTAT) 0.4 MG SL tablet Place 0.4 mg under the tongue every 5 (five) minutes as needed for chest pain.    [provider]  pravastatin (PRAVACHOL) 20 MG tablet Take 20 mg by mouth at bedtime. 07/05/20   [provider]  torsemide (DEMADEX) 20 MG tablet Take 1 tablet (20 mg total) by mouth daily. 11/03/21   Richarda Osmond, MD  venlafaxine XR (EFFEXOR-XR) 75 MG 24 hr capsule Take 75 mg by mouth daily with breakfast.     [provider]    Physical Exam: Vitals:   05/15/22 2100 05/15/22 2106 05/15/22 2115 05/15/22 2348  BP: (!) 166/117 (!) 166/117 (!) 156/90 (!) 133/94  Pulse: 98 99 81 89  Resp:  '20 16 17  '$ Temp:  97.9 F (36.6 C)    TempSrc:  Oral    SpO2: 99% 97% 97% 98%  Weight: 80.3 kg     Height: '5\' 5"'$  (1.651 m)      Constitutional: appears ***, NAD, calm, comfortable Eyes: PERRL, lids and conjunctivae normal ENMT: Mucous membranes are moist. Posterior pharynx clear of any exudate or lesions. Age-appropriate dentition. Hearing appropriate/loss*** Neck: normal, supple, no masses, no thyromegaly Respiratory: clear to auscultation bilaterally, no wheezing, no crackles. Normal respiratory effort. No accessory muscle use.  Cardiovascular: Regular rate and rhythm, no murmurs / rubs / gallops. No extremity edema. 2+ pedal pulses. No carotid bruits.  Abdomen: no tenderness, no masses palpated, no hepatosplenomegaly. Bowel sounds positive.  Musculoskeletal: no clubbing / cyanosis. No joint deformity upper and lower extremities. Good ROM, no contractures, no atrophy. Normal muscle tone.  Skin: no rashes, lesions, ulcers. No induration Neurologic: Sensation intact. Strength 5/5 in all 4.  Psychiatric: Normal judgment and insight. Alert and oriented x 3. Normal mood.    EKG: independently reviewed, showing ***  Chest x-ray on Admission: I personally reviewed and I agree*** with radiologist reading as below.  DG Chest 1 View  Result Date: 05/15/2022 CLINICAL DATA:  Injury fall EXAM: CHEST  1 VIEW COMPARISON:  04/04/2022 FINDINGS: The heart size and mediastinal contours are within normal limits. Both lungs are clear. Aortic atherosclerosis. Incompletely visualized right humerus fracture IMPRESSION: No active disease. Incompletely visualized proximal right humerus fracture Electronically Signed   By: Donavan Foil M.D.   On: 05/15/2022 21:36   DG Shoulder Right  Result Date: 05/15/2022 CLINICAL DATA:  Fall, injury EXAM: RIGHT SHOULDER - 2+ VIEW COMPARISON:  01/26/2022 FINDINGS: AC joint appears intact with mild degenerative changes. Acute mildly comminuted fracture involving right humeral neck and greater tuberosity with valgus angulation of distal fracture fragment. Greater tuberosity fracture fragment appears displaced but the humeral head appears to project over the glenoid. IMPRESSION: Acute mildly comminuted displaced and angulated fracture involving the right humeral neck and greater tuberosity Electronically Signed   By: Donavan Foil M.D.   On: 05/15/2022 21:35   DG Wrist Complete Left  Result Date: 05/15/2022 CLINICAL DATA:  Status post trauma. EXAM: LEFT WRIST - COMPLETE 3+ VIEW COMPARISON:  September 29, 2019 FINDINGS: There is no evidence of an acute fracture or dislocation. Marked severity degenerative changes are seen involving the carpometacarpal articulation of the left thumb. Additional chronic and degenerative changes are seen involving the soft tissues adjacent to the distal left ulna. IMPRESSION: Degenerative changes without an acute osseous abnormality. Electronically Signed   By: Virgina Norfolk M.D.   On: 05/15/2022 23:29   CT HEAD WO CONTRAST (5MM)  Result Date: 05/15/2022 CLINICAL DATA:  Status post fall. EXAM: CT HEAD WITHOUT CONTRAST  TECHNIQUE: Contiguous axial images were obtained from the base of the skull through the vertex without intravenous contrast. RADIATION DOSE REDUCTION: This exam was performed according to the departmental dose-optimization program which includes automated exposure control, adjustment of the mA and/or kV according to patient size and/or use of iterative reconstruction technique. COMPARISON:  January 25, 2022 FINDINGS: Brain: There is mild cerebral atrophy with widening of the extra-axial spaces and ventricular dilatation. There are areas of decreased attenuation within the white matter tracts of the supratentorial brain, consistent with microvascular disease changes. Very small, chronic bilateral basal ganglia lacunar infarcts are noted. Vascular: No hyperdense vessel or unexpected calcification. Skull: Normal. Negative for fracture or focal  lesion. Sinuses/Orbits: No acute finding. Other: None. IMPRESSION: 1. No acute intracranial abnormality. 2. Generalized cerebral atrophy with chronic white matter small vessel ischemic changes. Electronically Signed   By: Virgina Norfolk M.D.   On: 05/15/2022 22:32   CT Cervical Spine Wo Contrast  Result Date: 05/15/2022 CLINICAL DATA:  Status post fall. EXAM: CT CERVICAL SPINE WITHOUT CONTRAST TECHNIQUE: Multidetector CT imaging of the cervical spine was performed without intravenous contrast. Multiplanar CT image reconstructions were also generated. RADIATION DOSE REDUCTION: This exam was performed according to the departmental dose-optimization program which includes automated exposure control, adjustment of the mA and/or kV according to patient size and/or use of iterative reconstruction technique. COMPARISON:  September 29, 2019 FINDINGS: Alignment: Normal. Skull base and vertebrae: No acute fracture. No primary bone lesion or focal pathologic process. Soft tissues and spinal canal: No prevertebral fluid or swelling. No visible canal hematoma. Disc levels: Marked severity  endplate sclerosis and mild anterior osteophyte formation are seen throughout the cervical spine. This is most prominent at the levels of C3-C4, C4-C5, C5-C6, C6-C7 and C7-T1. There is moderate to marked severity narrowing of the anterior atlantoaxial articulation. Moderate to marked severity intervertebral disc space narrowing is seen at C3-C4, C4-C5 and C5-C6. Marked severity intervertebral disc space narrowing is seen at C6-C7 and C7-T1. Bilateral moderate to marked severity multilevel facet joint hypertrophy is noted. Upper chest: Negative. Other: None. IMPRESSION: 1. No acute fracture or subluxation of the cervical spine. 2. Marked severity multilevel degenerative changes, as described above. Electronically Signed   By: Virgina Norfolk M.D.   On: 05/15/2022 22:36    Labs on Admission: I have personally reviewed following labs  CBC: Recent Labs  Lab 05/15/22 2103  WBC 9.8  NEUTROABS 5.7  HGB 13.5  HCT 43.5  MCV 92.4  PLT 782   Basic Metabolic Panel: Recent Labs  Lab 05/15/22 2103  NA 141  K 4.1  CL 106  CO2 21*  GLUCOSE 243*  BUN 21  CREATININE 1.24*  CALCIUM 8.8*   GFR: Estimated Creatinine Clearance: 33.5 mL/min (A) (by C-G formula based on SCr of 1.24 mg/dL (H)).  Liver Function Tests: Recent Labs  Lab 05/15/22 2103  AST 29  ALT 21  ALKPHOS 98  BILITOT 0.7  PROT 6.5  ALBUMIN 3.8   Urine analysis:    Component Value Date/Time   COLORURINE YELLOW 01/25/2022 1043   APPEARANCEUR Cloudy (A) 03/19/2022 1131   LABSPEC 1.015 01/25/2022 1043   PHURINE 5.0 01/25/2022 1043   GLUCOSEU Negative 03/19/2022 1131   HGBUR LARGE (A) 01/25/2022 1043   BILIRUBINUR Negative 03/19/2022 1131   KETONESUR 15 (A) 01/25/2022 1043   PROTEINUR 1+ (A) 03/19/2022 1131   PROTEINUR >300 (A) 01/25/2022 1043   NITRITE Negative 03/19/2022 1131   NITRITE POSITIVE (A) 01/25/2022 1043   LEUKOCYTESUR 2+ (A) 03/19/2022 1131   LEUKOCYTESUR MODERATE (A) 01/25/2022 1043   Dr. Tobie Poet [facility]  If 7PM-7AM, please contact overnight-coverage provider If 7AM-7PM, please contact day coverage provider [email or web address]  05/15/2022, 11:51 PM

## 2022-05-15 NOTE — ED Triage Notes (Signed)
patient coming from home patient tripped over stuff and fell and injured her right shoulder. pt did not hit her head, denies LOC and is on blood thinner. pt has cardiac hx about 3 months ago had an MI here at [facility]. Vital 169/90, 100% on RA, HR 101 RR20

## 2022-05-15 NOTE — ED Provider Notes (Signed)
[facility] Provider Note    Event Date/Time   First MD Initiated Contact with Patient 05/15/22 2102     (approximate)   History   Fall   HPI  Alison Richardson is a 88 y.o. female past medical history of atrial flutter/fib on anticoagulation, diabetes, heart failure, hypertension, peripheral vascular disease who presents after a fall.  Patient tripped after she ran into a piece of furniture in her home falling onto her right shoulder.  Unsure if she hit her head.  Complains of right shoulder pain, denies numbness tingling weakness in the arm.  Denies chest pain abdominal pain hip pain.  Was not able to get up on her own.  Typically walks either unassisted or with a cane.  She [facility] independently at [facility].    Past Medical History:  Diagnosis Date   Arrhythmia    Atrial flutter ([place]) 01/2018   new onset    Basal cell carcinoma of back    Basal cell carcinoma of lip    Cerebral aneurysm    followed by [facility]   CHF (congestive heart failure) (HCC)    DDD (degenerative disc disease), lumbar    superior plate depression, L3 08/18/2014   Diabetes mellitus type II, controlled ([place])    Diverticulosis    Dysrhythmia    Paroxysmal Supraventricular Tachycardia   Dysthymia    depression   GERD (gastroesophageal reflux disease)    History of meniscal tear    Hyperlipidemia    Hypertension    Hypothyroidism    Late onset Alzheimer's disease with behavioral disturbance ([place])    Mild pulmonary hypertension ([place])    Overactive bladder    Peripheral vascular disease (HCC)    Seasonal allergic rhinitis    Sleep apnea     Patient Active Problem List   Diagnosis Date Noted   Use of cane as ambulatory aid 04/03/2022   History of falling 04/03/2022   History of non-ST elevation myocardial infarction (NSTEMI) 04/03/2022   History of cerebral aneurysm 04/03/2022   Acute cough 04/03/2022   Gastroesophageal reflux disease 02/12/2022   DDD (degenerative disc  disease), lumbar    Chronic diastolic CHF (congestive heart failure) ([place]) 10/13/2020   DOE (dyspnea on exertion) 05/09/2020   Cor pulmonale, chronic ([place]) 09/27/2019   Late onset Alzheimer's disease without behavioral disturbance ([place]) 09/23/2019   Obstructive sleep apnea 07/09/2019   Type 2 diabetes mellitus with peripheral neuropathy ([place]) 01/21/2019   PSVT (paroxysmal supraventricular tachycardia) ([place]) 01/21/2019   PAD (peripheral artery disease) ([place]) 01/06/2019   DM type 2 with diabetic mixed hyperlipidemia ([place]) 09/08/2018   Anxiety disorder 06/19/2018   Depression, major, single episode, mild ([place]) 06/19/2018   Vitamin B12 deficiency 06/19/2018   Insomnia 06/19/2018   Protein-calorie malnutrition ([place]) 06/19/2018   Hypertension associated with diabetes ([place]) 06/19/2018   Hypothyroidism 06/19/2018   Overactive bladder 06/19/2018   Primary osteoarthritis involving multiple joints 06/18/2018   Hyperlipidemia associated with type 2 diabetes mellitus ([place]) 11/01/2015     Physical Exam  Triage Vital Signs: ED Triage Vitals  Enc Vitals Group     BP 05/15/22 2100 (!) 166/117     Pulse Rate 05/15/22 2100 98     Resp 05/15/22 2106 20     Temp 05/15/22 2106 97.9 F (36.6 C)     Temp Source 05/15/22 2106 Oral     SpO2 05/15/22 2100 99 %     Weight 05/15/22 2100 177 lb (80.3 kg)  Height 05/15/22 2100 '5\' 5"'$  (1.651 m)     Head Circumference --      Peak Flow --      Pain Score 05/15/22 2100 10     Pain Loc --      Pain Edu? --      Excl. in [place]? --     Most recent vital signs: Vitals:   05/15/22 2106 05/15/22 2115  BP: (!) 166/117 (!) 156/90  Pulse: 99 81  Resp: 20 16  Temp: 97.9 F (36.6 C)   SpO2: 97% 97%     General: Awake, no distress.  CV:  Good peripheral perfusion.  Resp:  Normal effort.  Abd:  No distention. Soft, nontender Neuro:             Awake, Alert, Oriented x 3  Other:  Tenderness to palpation over the right anterior shoulder, no significant  deformity, no tenderness to palpation over the right upper arm elbow or wrist, 2+ radial pulse, intact thumbs up, okay sign and finger abduction Mild tenderness over the right wrist diffusely no snuffbox tenderness, normal range of motion  Pelvis is stable nontender, able to range bilateral hips Chest wall is nontender No C-spine tenderness   ED Results / Procedures / Treatments  Labs (all labs ordered are listed, but only abnormal results are displayed) Labs Reviewed  CBC WITH DIFFERENTIAL/PLATELET  COMPREHENSIVE METABOLIC PANEL     EKG     RADIOLOGY X-ray of the right shoulder interpreted by myself shows a proximal humeral head fracture no dislocation   PROCEDURES:  Critical Care performed: No  Procedures    MEDICATIONS ORDERED IN ED: Medications  HYDROmorphone (DILAUDID) injection 0.5 mg (0.5 mg Intravenous Given 05/15/22 2212)  ondansetron (ZOFRAN) injection 4 mg (4 mg Intravenous Given 05/15/22 2212)  HYDROmorphone (DILAUDID) injection 0.5 mg (0.5 mg Intravenous Given 05/15/22 2325)     IMPRESSION / MDM / ASSESSMENT AND PLAN / ED COURSE  I reviewed the triage vital signs and the nursing notes.                              Differential diagnosis includes, but is not limited to, humeral head fracture, shoulder dislocation, midshaft humerus fracture  The patient is an 88 year old female presenting after mechanical fall onto her right side.  Primary complaints of right shoulder pain.  She is not sure of her head head and is anticoagulated.  On exam she is tender over the right shoulder but is neurovascular intact.  Also has some mild left wrist tenderness no deformity no snuffbox tenderness.  She has no C-spine tenderness no chest wall tenderness abdomen is soft nontender pelvis is stable and able to range both hips bilaterally.  I reviewed the x-ray of the right shoulder which shows a proximal numeral head fracture is mildly displaced.  Chest x-ray does not have any  rib fractures CT head and C-spine are without acute abnormality.  Patient was given dose of Dilaudid and Zofran for pain and nausea related to pain.   Patient was placed in a shoulder immobilizer for the proximal humerus fracture.  Will need outpatient orthopedic follow-up.  On reassessment patient significantly uncomfortable and tells me that there is no way that she can go home tonight.  She lives alone in [place] and does not feel that she will be able to get up and walk around with the pain she is in.  Patient is having difficulty  even sitting up in bed due to pain.  Do not think she will be appropriate to be discharged back to [facility] especially given that she lives alone and will need to use a cane.  We will give another dose of IV Dilaudid.  Will discuss with hospice for admission.      FINAL CLINICAL IMPRESSION(S) / ED DIAGNOSES   Final diagnoses:  Closed fracture of proximal end of right humerus, unspecified fracture morphology, initial encounter     Rx / DC Orders   ED Discharge Orders     None        Note:  This document was prepared using Dragon voice recognition software and may include unintentional dictation errors.   Rada Hay, MD 05/15/22 [phone]

## 2022-05-16 DIAGNOSIS — S42251A Displaced fracture of greater tuberosity of right humerus, initial encounter for closed fracture: Secondary | ICD-10-CM | POA: Diagnosis not present

## 2022-05-16 DIAGNOSIS — S42211A Unspecified displaced fracture of surgical neck of right humerus, initial encounter for closed fracture: Secondary | ICD-10-CM | POA: Diagnosis present

## 2022-05-16 DIAGNOSIS — Y92009 Unspecified place in unspecified non-institutional (private) residence as the place of occurrence of the external cause: Secondary | ICD-10-CM | POA: Diagnosis not present

## 2022-05-16 DIAGNOSIS — Z85828 Personal history of other malignant neoplasm of skin: Secondary | ICD-10-CM | POA: Diagnosis not present

## 2022-05-16 DIAGNOSIS — Z79899 Other long term (current) drug therapy: Secondary | ICD-10-CM | POA: Diagnosis not present

## 2022-05-16 DIAGNOSIS — I482 Chronic atrial fibrillation, unspecified: Secondary | ICD-10-CM | POA: Diagnosis present

## 2022-05-16 DIAGNOSIS — I152 Hypertension secondary to endocrine disorders: Secondary | ICD-10-CM | POA: Diagnosis present

## 2022-05-16 DIAGNOSIS — S42213A Unspecified displaced fracture of surgical neck of unspecified humerus, initial encounter for closed fracture: Secondary | ICD-10-CM | POA: Diagnosis present

## 2022-05-16 DIAGNOSIS — G301 Alzheimer's disease with late onset: Secondary | ICD-10-CM | POA: Diagnosis present

## 2022-05-16 DIAGNOSIS — E1151 Type 2 diabetes mellitus with diabetic peripheral angiopathy without gangrene: Secondary | ICD-10-CM | POA: Diagnosis present

## 2022-05-16 DIAGNOSIS — Z9181 History of falling: Secondary | ICD-10-CM | POA: Diagnosis not present

## 2022-05-16 DIAGNOSIS — Z833 Family history of diabetes mellitus: Secondary | ICD-10-CM | POA: Diagnosis not present

## 2022-05-16 DIAGNOSIS — E039 Hypothyroidism, unspecified: Secondary | ICD-10-CM | POA: Diagnosis present

## 2022-05-16 DIAGNOSIS — G4733 Obstructive sleep apnea (adult) (pediatric): Secondary | ICD-10-CM | POA: Diagnosis present

## 2022-05-16 DIAGNOSIS — K219 Gastro-esophageal reflux disease without esophagitis: Secondary | ICD-10-CM | POA: Diagnosis present

## 2022-05-16 DIAGNOSIS — I252 Old myocardial infarction: Secondary | ICD-10-CM | POA: Diagnosis not present

## 2022-05-16 DIAGNOSIS — E1169 Type 2 diabetes mellitus with other specified complication: Secondary | ICD-10-CM | POA: Diagnosis present

## 2022-05-16 DIAGNOSIS — R52 Pain, unspecified: Secondary | ICD-10-CM | POA: Diagnosis present

## 2022-05-16 DIAGNOSIS — M159 Polyosteoarthritis, unspecified: Secondary | ICD-10-CM | POA: Diagnosis present

## 2022-05-16 DIAGNOSIS — Z8249 Family history of ischemic heart disease and other diseases of the circulatory system: Secondary | ICD-10-CM | POA: Diagnosis not present

## 2022-05-16 DIAGNOSIS — S42309A Unspecified fracture of shaft of humerus, unspecified arm, initial encounter for closed fracture: Secondary | ICD-10-CM | POA: Diagnosis present

## 2022-05-16 DIAGNOSIS — W010XXA Fall on same level from slipping, tripping and stumbling without subsequent striking against object, initial encounter: Secondary | ICD-10-CM | POA: Diagnosis present

## 2022-05-16 DIAGNOSIS — I2510 Atherosclerotic heart disease of native coronary artery without angina pectoris: Secondary | ICD-10-CM | POA: Diagnosis present

## 2022-05-16 DIAGNOSIS — Z888 Allergy status to other drugs, medicaments and biological substances status: Secondary | ICD-10-CM | POA: Diagnosis not present

## 2022-05-16 DIAGNOSIS — Z96652 Presence of left artificial knee joint: Secondary | ICD-10-CM | POA: Diagnosis present

## 2022-05-16 DIAGNOSIS — Z66 Do not resuscitate: Secondary | ICD-10-CM | POA: Diagnosis present

## 2022-05-16 DIAGNOSIS — Z7902 Long term (current) use of antithrombotics/antiplatelets: Secondary | ICD-10-CM | POA: Diagnosis not present

## 2022-05-16 DIAGNOSIS — G4730 Sleep apnea, unspecified: Secondary | ICD-10-CM | POA: Diagnosis present

## 2022-05-16 DIAGNOSIS — F0284 Dementia in other diseases classified elsewhere, unspecified severity, with anxiety: Secondary | ICD-10-CM | POA: Diagnosis present

## 2022-05-16 DIAGNOSIS — E785 Hyperlipidemia, unspecified: Secondary | ICD-10-CM | POA: Diagnosis present

## 2022-05-16 HISTORY — DX: Unspecified displaced fracture of surgical neck of unspecified humerus, initial encounter for closed fracture: S42.213A

## 2022-05-16 LAB — CBC
HCT: 38.2 % (ref 36.0–46.0)
Hemoglobin: 12.3 g/dL (ref 12.0–15.0)
MCH: 28.6 pg (ref 26.0–34.0)
MCHC: 32.2 g/dL (ref 30.0–36.0)
MCV: 88.8 fL (ref 80.0–100.0)
Platelets: 273 10*3/uL (ref 150–400)
RBC: 4.3 MIL/uL (ref 3.87–5.11)
RDW: 14.1 % (ref 11.5–15.5)
WBC: 14.2 10*3/uL — ABNORMAL HIGH (ref 4.0–10.5)
nRBC: 0 % (ref 0.0–0.2)

## 2022-05-16 LAB — MRSA NEXT GEN BY PCR, NASAL: MRSA by PCR Next Gen: NOT DETECTED

## 2022-05-16 LAB — BASIC METABOLIC PANEL
Anion gap: 9 (ref 5–15)
BUN: 19 mg/dL (ref 8–23)
CO2: 26 mmol/L (ref 22–32)
Calcium: 8.9 mg/dL (ref 8.9–10.3)
Chloride: 105 mmol/L (ref 98–111)
Creatinine, Ser: 0.98 mg/dL (ref 0.44–1.00)
GFR, Estimated: 56 mL/min — ABNORMAL LOW (ref >60)
Glucose, Bld: 229 mg/dL — ABNORMAL HIGH (ref 70–99)
Potassium: 4.9 mmol/L (ref 3.5–5.1)
Sodium: 140 mmol/L (ref 135–145)

## 2022-05-16 MED ORDER — ALBUTEROL SULFATE (2.5 MG/3ML) 0.083% IN NEBU: 2.5000 mg | INHALATION_SOLUTION | Freq: Four times a day (QID) | RESPIRATORY_TRACT | Status: DC | PRN

## 2022-05-16 MED ORDER — AMIODARONE HCL 200 MG PO TABS
200.0000 mg | ORAL_TABLET | Freq: Every day | ORAL | Status: DC
  Administered 2022-05-16 – 2022-05-17 (×2): 200 mg via ORAL
  Filled 2022-05-16 (×2): qty 1

## 2022-05-16 MED ORDER — OXYCODONE HCL 5 MG PO TABS
5.0000 mg | ORAL_TABLET | ORAL | Status: DC | PRN
  Administered 2022-05-16 – 2022-05-17 (×3): 5 mg via ORAL
  Filled 2022-05-16 (×3): qty 1

## 2022-05-16 MED ORDER — METOPROLOL SUCCINATE ER 50 MG PO TB24
100.0000 mg | ORAL_TABLET | Freq: Every day | ORAL | Status: DC
  Administered 2022-05-16 – 2022-05-17 (×2): 100 mg via ORAL
  Filled 2022-05-16 (×2): qty 2

## 2022-05-16 MED ORDER — ACETAMINOPHEN 500 MG PO TABS
1000.0000 mg | ORAL_TABLET | Freq: Four times a day (QID) | ORAL | Status: DC
  Administered 2022-05-16 – 2022-05-17 (×5): 1000 mg via ORAL
  Filled 2022-05-16 (×5): qty 2

## 2022-05-16 MED ORDER — METOPROLOL TARTRATE 5 MG/5ML IV SOLN: 5.0000 mg | INTRAVENOUS | Status: DC | PRN

## 2022-05-16 MED ORDER — CLOPIDOGREL BISULFATE 75 MG PO TABS
75.0000 mg | ORAL_TABLET | Freq: Every day | ORAL | Status: DC
  Administered 2022-05-16 – 2022-05-17 (×2): 75 mg via ORAL
  Filled 2022-05-16 (×2): qty 1

## 2022-05-16 MED ORDER — DONEPEZIL HCL 5 MG PO TABS
10.0000 mg | ORAL_TABLET | Freq: Every day | ORAL | Status: DC
  Administered 2022-05-16: 10 mg via ORAL
  Filled 2022-05-16: qty 2

## 2022-05-16 MED ORDER — ISOSORBIDE MONONITRATE ER 30 MG PO TB24
30.0000 mg | ORAL_TABLET | Freq: Every day | ORAL | Status: DC
  Administered 2022-05-16 – 2022-05-17 (×2): 30 mg via ORAL
  Filled 2022-05-16 (×2): qty 1

## 2022-05-16 MED ORDER — MONTELUKAST SODIUM 10 MG PO TABS
10.0000 mg | ORAL_TABLET | Freq: Every day | ORAL | Status: DC
  Administered 2022-05-16: 10 mg via ORAL
  Filled 2022-05-16: qty 1

## 2022-05-16 MED ORDER — VENLAFAXINE HCL ER 75 MG PO CP24
75.0000 mg | ORAL_CAPSULE | Freq: Every day | ORAL | Status: DC
  Administered 2022-05-16 – 2022-05-17 (×2): 75 mg via ORAL
  Filled 2022-05-16 (×2): qty 1

## 2022-05-16 MED ORDER — APIXABAN 5 MG PO TABS
5.0000 mg | ORAL_TABLET | Freq: Two times a day (BID) | ORAL | Status: DC
  Administered 2022-05-16 – 2022-05-17 (×3): 5 mg via ORAL
  Filled 2022-05-16 (×3): qty 1

## 2022-05-16 MED ORDER — HYDROMORPHONE HCL 1 MG/ML IJ SOLN
0.5000 mg | INTRAMUSCULAR | Status: DC | PRN
  Administered 2022-05-16 (×2): 0.5 mg via INTRAVENOUS
  Filled 2022-05-16 (×2): qty 1

## 2022-05-16 MED ORDER — TORSEMIDE 20 MG PO TABS
20.0000 mg | ORAL_TABLET | Freq: Every day | ORAL | Status: DC
  Administered 2022-05-16 – 2022-05-17 (×2): 20 mg via ORAL
  Filled 2022-05-16 (×2): qty 1

## 2022-05-16 MED ORDER — GABAPENTIN 100 MG PO CAPS
100.0000 mg | ORAL_CAPSULE | Freq: Two times a day (BID) | ORAL | Status: DC
  Administered 2022-05-16 – 2022-05-17 (×3): 100 mg via ORAL
  Filled 2022-05-16 (×3): qty 1

## 2022-05-16 NOTE — Assessment & Plan Note (Signed)
-   Eliquis 5 mg p.o. twice daily resumed 

## 2022-05-16 NOTE — Assessment & Plan Note (Signed)
-   Pravastatin 20 mg nightly

## 2022-05-16 NOTE — Evaluation (Signed)
Occupational Therapy Evaluation Patient Details Name: Alison Richardson MRN: [id number] DOB: [birth year] Today's Date: 05/16/2022   History of Present Illness Alison Richardson is a 88 y.o. female past medical history of atrial flutter/fib on anticoagulation, diabetes, heart failure, hypertension, peripheral vascular disease who presents after a fall.  Patient tripped after she ran into a piece of furniture in her home falling onto her right shoulder.  Unsure if she hit her head.  Complains of right shoulder pain, denies numbness tingling weakness in the arm.  Denies chest pain abdominal pain hip pain.  Was not able to get up on her own.  Typically walks either unassisted or with a cane.  She lives independently at [facility].   Clinical Impression   Ms Devins was seen for OT evaluation this date. Prior to hospital admission, pt was MOD I for mobliity using [id number]. Pt lives alone at [facility]. Pt presents to acute OT demonstrating impaired ADL performance and functional mobility 2/2 decreased activity tolerance and functional strength/ROM/balance deficits. Pt currently requires MOD A don/doff sling in sitting. MAX A don/doff B socks in sitting. MIN A + SPC for ADL t/f. Pt would benefit from skilled OT to address noted impairments and functional limitations (see below for any additional details). Upon hospital discharge, recommend STR to maximize pt safety and return to PLOF.    Recommendations for follow up therapy are one component of a multi-disciplinary discharge planning process, led by the attending physician.  Recommendations may be updated based on patient status, additional functional criteria and insurance authorization.   Follow Up Recommendations  Skilled nursing-short term rehab (<3 hours/day)    Assistance Recommended at Discharge Frequent or constant Supervision/Assistance  Patient can return home with the following A lot of help with walking and/or transfers;A lot of help with  bathing/dressing/bathroom    Functional Status Assessment  Patient has had a recent decline in their functional status and demonstrates the ability to make significant improvements in function in a reasonable and predictable amount of time.  Equipment Recommendations  BSC/3in1    Recommendations for Other Services       Precautions / Restrictions Precautions Precautions: Fall Splint/Cast - Date Prophylactic Dressing Applied (if applicable): 58/09/98 Restrictions Weight Bearing Restrictions: Yes RUE Weight Bearing: Non weight bearing      Mobility Bed Mobility Overal bed mobility: Needs Assistance Bed Mobility: Supine to Sit, Sit to Supine     Supine to sit: Min assist Sit to supine: Min guard        Transfers Overall transfer level: Needs assistance Equipment used: Straight cane Transfers: Sit to/from Stand Sit to Stand: Min assist                  Balance Overall balance assessment: Needs assistance Sitting-balance support: Single extremity supported, Feet supported Sitting balance-Leahy Scale: Fair   Postural control: Left lateral lean Standing balance support: Single extremity supported, During functional activity, Reliant on assistive device for balance Standing balance-Leahy Scale: Poor                             ADL either performed or assessed with clinical judgement   ADL Overall ADL's : Needs assistance/impaired                                       General ADL Comments: MOD A don/doff sling in sitting.  MAX A don/doff B socks in sitting. MIN A + SPC for ADL t/f      Pertinent Vitals/Pain Pain Assessment Pain Assessment: 0-10 Pain Score: 5  Pain Location: R Shoulder at fx site. Pain Descriptors / Indicators: Aching Pain Intervention(s): Limited activity within patient's tolerance, Repositioned     Hand Dominance Right   Extremity/Trunk Assessment Upper Extremity Assessment Upper Extremity Assessment:  RUE deficits/detail RUE Deficits / Details: Unable to assess due to weigth-bearing precautions. RUE: Unable to fully assess due to immobilization   Lower Extremity Assessment Lower Extremity Assessment: Generalized weakness       Communication Communication Communication: No difficulties   Cognition Arousal/Alertness: Awake/alert Behavior During Therapy: WFL for tasks assessed/performed Overall Cognitive Status: Within Functional Limits for tasks assessed                                                  Home Living Family/patient expects to be discharged to:: Assisted living Living Arrangements: Alone Available Help at Discharge: Available PRN/intermittently;Neighbor Type of Home: Independent living facility Home Access: [place]: One level     Bathroom Shower/Tub: Occupational psychologist: Handicapped height Bathroom Accessibility: Yes   Home Equipment: [place] (4 wheels);Shower seat;Grab bars - tub/shower;Grab bars - toilet;Cane - single point   Additional Comments: [place] apartment on the 5th floor with elevator access      Prior Functioning/Environment Prior Level of Function : Independent/Modified Independent             Mobility Comments: pt utilized [employer] everywhere outside of the apartment. ADLs Comments: independent prior        OT Problem List: Decreased strength;Decreased range of motion;Decreased activity tolerance;Impaired balance (sitting and/or standing);Decreased safety awareness      OT Treatment/Interventions: Therapeutic exercise;Self-care/ADL training;Energy conservation;DME and/or AE instruction;Patient/family education;Balance training;Therapeutic activities    OT Goals(Current goals can be found in the care plan section) Acute Rehab OT Goals Patient Stated Goal: to go home OT Goal Formulation: With patient Time For Goal Achievement: 05/30/22 Potential to Achieve Goals:  Good ADL Goals Pt Will Perform Grooming: with set-up;with supervision;standing Pt Will Perform Upper Body Dressing: with min assist;standing Pt Will Perform Lower Body Dressing: with min assist;sit to/from stand Pt Will Transfer to Toilet: with min assist;ambulating;regular height toilet  OT Frequency: Min 2X/week    Co-evaluation              AM-PAC OT "6 Clicks" Daily Activity     Outcome Measure Help from another person eating meals?: A Little Help from another person taking care of personal grooming?: A Little Help from another person toileting, which includes using toliet, bedpan, or urinal?: A Lot Help from another person bathing (including washing, rinsing, drying)?: A Lot Help from another person to put on and taking off regular upper body clothing?: A Lot Help from another person to put on and taking off regular lower body clothing?: A Lot 6 Click Score: 14   End of Session    Activity Tolerance: Patient tolerated treatment well Patient left: in bed;with call bell/phone within reach;with bed alarm set  OT Visit Diagnosis: Other abnormalities of gait and mobility (R26.89)                Time: 8469-6295 OT Time Calculation (min): 14 min Charges:  OT General Charges $OT Visit: 1 Visit OT Evaluation $OT Eval Moderate Complexity: 1 Mod  Dessie Coma, M.S. OTR/L  05/16/22, 4:19 PM  ascom [phone]

## 2022-05-16 NOTE — Progress Notes (Addendum)
0810 Offered to sit pt up for breakfast in chair. Pt refused. Pt sitting up in chair position   1233 Pt sitting up in chair for lunch. Call bell and possessions within reach

## 2022-05-16 NOTE — Assessment & Plan Note (Signed)
-   Metoprolol succinate 100 mg daily, isosorbide mononitrate 30 mg daily resumed

## 2022-05-16 NOTE — Telephone Encounter (Signed)
Called patient to verify if she is still taking medication. No answer, LVMTCB [phone].

## 2022-05-16 NOTE — Assessment & Plan Note (Signed)
-   Levothyroxine 75 mcg resumed

## 2022-05-16 NOTE — Consult Note (Signed)
Reason for Consult: Right shoulder fracture Referring Physician: Dr. Andria Rhein Alison Richardson is an 88 y.o. female.  HPI: Patient is a 88 year old who suffered a fall at home yesterday landing on her right side.  She was admitted and was found to have a comminuted proximal humerus fracture.  She denies loss of consciousness she is right-hand dominant and normally independent.  Past Medical History:  Diagnosis Date   Arrhythmia    Atrial flutter (Alison Richardson) 01/2018   new onset    Basal cell carcinoma of back    Basal cell carcinoma of lip    Cerebral aneurysm    followed by [facility]   CHF (congestive heart failure) (HCC)    DDD (degenerative disc disease), lumbar    superior plate depression, L3 08/18/2014   Diabetes mellitus type II, controlled (Alison Richardson)    Diverticulosis    Dysrhythmia    Paroxysmal Supraventricular Tachycardia   Dysthymia    depression   GERD (gastroesophageal reflux disease)    History of meniscal tear    Hyperlipidemia    Hypertension    Hypothyroidism    Late onset Alzheimer's disease with behavioral disturbance (Alison Richardson)    Mild pulmonary hypertension (Alison Richardson)    Overactive bladder    Peripheral vascular disease (Alison Richardson)    Seasonal allergic rhinitis    Sleep apnea     Past Surgical History:  Procedure Laterality Date   APPENDECTOMY  1946   BASAL CELL CARCINOMA EXCISION     2006 and 2009 removed from back and lip   CARDIOVASCULAR STRESS TEST  2015   nuclear cardiac stress test negative for ischemia - Dr. Satira Sark   CATARACT EXTRACTION, BILATERAL  2006   COLONOSCOPY  2014   COLONOSCOPY N/A 08/19/2020   Procedure: COLONOSCOPY;  Surgeon: Lesly Rubenstein, MD;  Location: [facility] ENDOSCOPY;  Service: Endoscopy;  Laterality: N/A;   ECTOPIC PREGNANCY SURGERY  1957   ESOPHAGOGASTRODUODENOSCOPY N/A 08/19/2020   Procedure: ESOPHAGOGASTRODUODENOSCOPY (EGD);  Surgeon: Lesly Rubenstein, MD;  Location[facility] ENDOSCOPY;  Service: Endoscopy;  Laterality: N/A;   INJECTION KNEE Left  05/2015   LEFT HEART CATH AND CORONARY ANGIOGRAPHY N/A 10/12/2021   Procedure: LEFT HEART CATH AND CORONARY ANGIOGRAPHY;  Surgeon: Corey Skains, MD;  Location: [place] [facility];  Service: Cardiovascular;  Laterality: N/A;   REPLACEMENT TOTAL KNEE Left 09/06/2016   Dr. Barnet Pall   TONSILLECTOMY AND ADENOIDECTOMY  1953   TOTAL ABDOMINAL HYSTERECTOMY  1986   [facility] B    Family History  Problem Relation Age of Onset   Hypertension Mother    Heart disease Father    CAD Father    Heart disease Sister    Diabetes Sister    Diabetes Paternal Uncle     Social History:  reports that she has never smoked. She has never used smokeless tobacco. She reports current alcohol use of about 3.0 standard drinks per week. She reports that she does not use drugs.  Allergies:  Allergies  Allergen Reactions   Pantoprazole Nausea And Vomiting   Metformin And Related Diarrhea    Medications: I have reviewed the patient's current medications.  Results for orders placed or performed during the hospital encounter of 05/15/22 (from the past 48 hour(s))  Comprehensive metabolic panel     Status: Abnormal   Collection Time: 05/15/22  9:03 PM  Result Value Ref Range   Sodium 141 135 - 145 mmol/L   Potassium 4.1 3.5 - 5.1 mmol/L    Comment: HEMOLYSIS AT  THIS LEVEL MAY AFFECT RESULT   Chloride 106 98 - 111 mmol/L   CO2 21 (L) 22 - 32 mmol/L   Glucose, Bld 243 (H) 70 - 99 mg/dL    Comment: Glucose reference range applies only to samples taken after fasting for at least 8 hours.   BUN 21 8 - 23 mg/dL   Creatinine, Ser 1.24 (H) 0.44 - 1.00 mg/dL   Calcium 8.8 (L) 8.9 - 10.3 mg/dL   Total Protein 6.5 6.5 - 8.1 g/dL   Albumin 3.8 3.5 - 5.0 g/dL   AST 29 15 - 41 U/L    Comment: HEMOLYSIS AT THIS LEVEL MAY AFFECT RESULT   ALT 21 0 - 44 U/L    Comment: HEMOLYSIS AT THIS LEVEL MAY AFFECT RESULT   Alkaline Phosphatase 98 38 - 126 U/L   Total Bilirubin 0.7 0.3 - 1.2 mg/dL    Comment: HEMOLYSIS AT THIS  LEVEL MAY AFFECT RESULT   GFR, Estimated 42 (L) >60 mL/min    Comment: (NOTE) Calculated using the CKD-EPI Creatinine Equation (2021)    Anion gap 14 5 - 15    Comment: Performed at [facility], [address] [place], [place] [address]  CBC with Differential     Status: None   Collection Time: 05/15/22  9:03 PM  Result Value Ref Range   WBC 9.8 4.0 - 10.5 K/uL   RBC 4.71 3.87 - 5.11 MIL/uL   Hemoglobin 13.5 12.0 - 15.0 g/dL   HCT 43.5 36.0 - 46.0 %   MCV 92.4 80.0 - 100.0 fL   MCH 28.7 26.0 - 34.0 pg   MCHC 31.0 30.0 - 36.0 g/dL   RDW 14.3 11.5 - 15.5 %   Platelets 305 150 - 400 K/uL   nRBC 0.0 0.0 - 0.2 %   Neutrophils Relative % 58 %   Neutro Abs 5.7 1.7 - 7.7 K/uL   Lymphocytes Relative 30 %   Lymphs Abs 2.9 0.7 - 4.0 K/uL   Monocytes Relative 8 %   Monocytes Absolute 0.8 0.1 - 1.0 K/uL   Eosinophils Relative 2 %   Eosinophils Absolute 0.2 0.0 - 0.5 K/uL   Basophils Relative 1 %   Basophils Absolute 0.1 0.0 - 0.1 K/uL   Immature Granulocytes 1 %   Abs Immature Granulocytes 0.07 0.00 - 0.07 K/uL    Comment: Performed at [facility], [address] [place], [place] [address]  Basic metabolic panel     Status: Abnormal   Collection Time: 05/16/22  5:10 AM  Result Value Ref Range   Sodium 140 135 - 145 mmol/L   Potassium 4.9 3.5 - 5.1 mmol/L   Chloride 105 98 - 111 mmol/L   CO2 26 22 - 32 mmol/L   Glucose, Bld 229 (H) 70 - 99 mg/dL    Comment: Glucose reference range applies only to samples taken after fasting for at least 8 hours.   BUN 19 8 - 23 mg/dL   Creatinine, Ser 0.98 0.44 - 1.00 mg/dL   Calcium 8.9 8.9 - 10.3 mg/dL   GFR, Estimated 56 (L) >60 mL/min    Comment: (NOTE) Calculated using the CKD-EPI Creatinine Equation (2021)    Anion gap 9 5 - 15    Comment: Performed at [facility], [address] [place], [place] [address]  CBC     Status: Abnormal   Collection Time: 05/16/22  5:10 AM  Result Value Ref Range   WBC 14.2  (H)  4.0 - 10.5 K/uL   RBC 4.30 3.87 - 5.11 MIL/uL   Hemoglobin 12.3 12.0 - 15.0 g/dL   HCT 38.2 36.0 - 46.0 %   MCV 88.8 80.0 - 100.0 fL   MCH 28.6 26.0 - 34.0 pg   MCHC 32.2 30.0 - 36.0 g/dL   RDW 14.1 11.5 - 15.5 %   Platelets 273 150 - 400 K/uL   nRBC 0.0 0.0 - 0.2 %    Comment: Performed at [facility], [address] [place], [place] [address]  MRSA Next Gen by PCR, Nasal     Status: None   Collection Time: 05/16/22  6:05 AM   Specimen: Nasal Mucosa; Nasal Swab  Result Value Ref Range   MRSA by PCR Next Gen NOT DETECTED NOT DETECTED    Comment: (NOTE) The GeneXpert MRSA Assay (FDA approved for NASAL specimens only), is one component of a comprehensive MRSA colonization surveillance program. It is not intended to diagnose MRSA infection nor to guide or monitor treatment for MRSA infections. Test performance is not FDA approved in patients less than 16 years old. Performed at [facility], [address] [place],  [address]     DG Chest 1 View  Result Date: 05/15/2022 CLINICAL DATA:  Injury fall EXAM: CHEST  1 VIEW COMPARISON:  04/04/2022 FINDINGS: The heart size and mediastinal contours are within normal limits. Both lungs are clear. Aortic atherosclerosis. Incompletely visualized right humerus fracture IMPRESSION: No active disease. Incompletely visualized proximal right humerus fracture Electronically Signed   By: Donavan Foil M.D.   On: 05/15/2022 21:36   DG Shoulder Right  Result Date: 05/15/2022 CLINICAL DATA:  Fall, injury EXAM: RIGHT SHOULDER - 2+ VIEW COMPARISON:  01/26/2022 FINDINGS: AC joint appears intact with mild degenerative changes. Acute mildly comminuted fracture involving right humeral neck and greater tuberosity with valgus angulation of distal fracture fragment. Greater tuberosity fracture fragment appears displaced but the humeral head appears to project over the glenoid. IMPRESSION: Acute mildly comminuted displaced and  angulated fracture involving the right humeral neck and greater tuberosity Electronically Signed   By: Donavan Foil M.D.   On: 05/15/2022 21:35   DG Wrist Complete Left  Result Date: 05/15/2022 CLINICAL DATA:  Status post trauma. EXAM: LEFT WRIST - COMPLETE 3+ VIEW COMPARISON:  September 29, 2019 FINDINGS: There is no evidence of an acute fracture or dislocation. Marked severity degenerative changes are seen involving the carpometacarpal articulation of the left thumb. Additional chronic and degenerative changes are seen involving the soft tissues adjacent to the distal left ulna. IMPRESSION: Degenerative changes without an acute osseous abnormality. Electronically Signed   By: Virgina Norfolk M.D.   On: 05/15/2022 23:29   CT HEAD WO CONTRAST (5MM)  Result Date: 05/15/2022 CLINICAL DATA:  Status post fall. EXAM: CT HEAD WITHOUT CONTRAST TECHNIQUE: Contiguous axial images were obtained from the base of the skull through the vertex without intravenous contrast. RADIATION DOSE REDUCTION: This exam was performed according to the departmental dose-optimization program which includes automated exposure control, adjustment of the mA and/or kV according to patient size and/or use of iterative reconstruction technique. COMPARISON:  January 25, 2022 FINDINGS: Brain: There is mild cerebral atrophy with widening of the extra-axial spaces and ventricular dilatation. There are areas of decreased attenuation within the white matter tracts of the supratentorial brain, consistent with microvascular disease changes. Very small, chronic bilateral basal ganglia lacunar infarcts are noted. Vascular: No hyperdense vessel or unexpected calcification. Skull: Normal. Negative for fracture  or focal lesion. Sinuses/Orbits: No acute finding. Other: None. IMPRESSION: 1. No acute intracranial abnormality. 2. Generalized cerebral atrophy with chronic white matter small vessel ischemic changes. Electronically Signed   By: Virgina Norfolk  M.D.   On: 05/15/2022 22:32   CT Cervical Spine Wo Contrast  Result Date: 05/15/2022 CLINICAL DATA:  Status post fall. EXAM: CT CERVICAL SPINE WITHOUT CONTRAST TECHNIQUE: Multidetector CT imaging of the cervical spine was performed without intravenous contrast. Multiplanar CT image reconstructions were also generated. RADIATION DOSE REDUCTION: This exam was performed according to the departmental dose-optimization program which includes automated exposure control, adjustment of the mA and/or kV according to patient size and/or use of iterative reconstruction technique. COMPARISON:  September 29, 2019 FINDINGS: Alignment: Normal. Skull base and vertebrae: No acute fracture. No primary bone lesion or focal pathologic process. Soft tissues and spinal canal: No prevertebral fluid or swelling. No visible canal hematoma. Disc levels: Marked severity endplate sclerosis and mild anterior osteophyte formation are seen throughout the cervical spine. This is most prominent at the levels of C3-C4, C4-C5, C5-C6, C6-C7 and C7-T1. There is moderate to marked severity narrowing of the anterior atlantoaxial articulation. Moderate to marked severity intervertebral disc space narrowing is seen at C3-C4, C4-C5 and C5-C6. Marked severity intervertebral disc space narrowing is seen at C6-C7 and C7-T1. Bilateral moderate to marked severity multilevel facet joint hypertrophy is noted. Upper chest: Negative. Other: None. IMPRESSION: 1. No acute fracture or subluxation of the cervical spine. 2. Marked severity multilevel degenerative changes, as described above. Electronically Signed   By: Virgina Norfolk M.D.   On: 05/15/2022 22:36    Review of Systems Blood pressure (!) 151/95, pulse (!) 106, temperature 98.4 F (36.9 C), resp. rate 19, height '5\' 5"'$  (1.651 m), weight 80.3 kg, SpO2 96 %. Physical Exam Sensation is intact in the axillary nerve distribution as well as into the hand.  She is able to flex extend the fingers.  Skin is  intact around the shoulder with some swelling. Assessment/Plan: Comminuted proximal humerus fracture.  Recommendation is for sling nonweightbearing on the right arm and follow-up with me in 3 weeks for follow-up x-ray make sure there is maintenance of alignment and if it were to shift she might require replacement.  Alison Richardson 05/16/2022, 12:07 PM

## 2022-05-16 NOTE — Evaluation (Signed)
Physical Therapy Evaluation Patient Details Name: Alison Richardson MRN: [id number] DOB: [birth year] Today's Date: 05/16/2022  History of Present Illness  Alison Richardson is a 88 y.o. female past medical history of atrial flutter/fib on anticoagulation, diabetes, heart failure, hypertension, peripheral vascular disease who presents after a fall.  Patient tripped after she ran into a piece of furniture in her home falling onto her right shoulder.  Unsure if she hit her head.  Complains of right shoulder pain, denies numbness tingling weakness in the arm.  Denies chest pain abdominal pain hip pain.  Was not able to get up on her own.  Typically walks either unassisted or with a cane.  She lives independently at [facility].    Clinical Impression  Pt received in supine position and agreeable to therapy.  Pt requesting bedding change due to voiding in the bed and purewick not operating correctly.  Pt required modA from therapist in order to come upright due to not being able to place weight through the R UE.  Pt educated on restrictions and understanding of precautions.  Pt noted some slight dizziness in sitting, with breathing and SpO2 on room air at >96% throughout session.  Pt transferred to sitting with modA from therapist and use of SPC in L UE.  Pt ambulated around the nursing station, 160 ft, with multiple standing rest breaks and 2 episodes of LOB that required minA from therapist to prevent uncontrolled descent.  Pt transferred to the chair and left with all needs met and call bell within reach.  Nursing notified of status and being upright in chair.  Pt unsteady during ambulation throughout and would benefit from discharge to SNF at this time due to lack of assistance at home and instability during walking.       Recommendations for follow up therapy are one component of a multi-disciplinary discharge planning process, led by the attending physician.  Recommendations may be updated based on patient  status, additional functional criteria and insurance authorization.  Follow Up Recommendations Skilled nursing-short term rehab (<3 hours/day)    Assistance Recommended at Discharge None  Patient can return home with the following  A lot of help with walking and/or transfers;A lot of help with bathing/dressing/bathroom    Equipment Recommendations None recommended by PT  Recommendations for Other Services       Functional Status Assessment Patient has had a recent decline in their functional status and demonstrates the ability to make significant improvements in function in a reasonable and predictable amount of time.     Precautions / Restrictions Precautions Splint/Cast - Date Prophylactic Dressing Applied (if applicable): 33/54/56 Restrictions Weight Bearing Restrictions: Yes RUE Weight Bearing: Non weight bearing      Mobility  Bed Mobility Overal bed mobility: Needs Assistance Bed Mobility: Supine to Sit     Supine to sit: Mod assist     General bed mobility comments: Pt requiring assistance from therapist to get up utilizing the L UE.    Transfers Overall transfer level: Needs assistance Equipment used: Straight cane Transfers: Sit to/from Stand Sit to Stand: Mod assist           General transfer comment: Pt again requiring modA from therapist to come upright into standing.    Ambulation/Gait Ambulation/Gait assistance: Min assist Gait Distance (Feet): 160 Feet Assistive device: Straight cane Gait Pattern/deviations: Decreased stride length, Narrow base of support Gait velocity: decreased     General Gait Details: Pt requires frequent rest breaks when ambulating and had 2  episodes of LOB that therapist had to provide minA to prevent uncontrolled descent.  Stairs            Wheelchair Mobility    Modified Rankin (Stroke Patients Only)       Balance Overall balance assessment: Needs assistance Sitting-balance support: Single extremity  supported, Feet supported Sitting balance-Leahy Scale: Fair   Postural control: Left lateral lean Standing balance support: Single extremity supported, During functional activity, Reliant on assistive device for balance Standing balance-Leahy Scale: Poor                               Pertinent Vitals/Pain Pain Assessment Pain Assessment: 0-10 Pain Score: 5  Pain Location: R Shoulder at fx site. Pain Descriptors / Indicators: Aching Pain Intervention(s): Limited activity within patient's tolerance, Monitored during session, Premedicated before session    Home Living Family/patient expects to be discharged to:: Assisted living Living Arrangements: Alone Available Help at Discharge: Available PRN/intermittently;Neighbor Type of Home: Independent living facility Home Access: [place]: One level Home Equipment: [place] (4 wheels);Shower seat;Grab bars - tub/shower;Grab bars - toilet;Cane - single point Additional Comments: [place] apartment on the 5th floor with elevator access    Prior Function Prior Level of Function : Independent/Modified Independent             Mobility Comments: pt utilized RW everywhere outside of the apartment. ADLs Comments: independent prior     Hand Dominance   Dominant Hand: Right    Extremity/Trunk Assessment   Upper Extremity Assessment Upper Extremity Assessment: RUE deficits/detail RUE Deficits / Details: Unable to assess due to weigth-bearing precautions. RUE: Unable to fully assess due to immobilization    Lower Extremity Assessment Lower Extremity Assessment: Generalized weakness       Communication      Cognition Arousal/Alertness: Awake/alert Behavior During Therapy: WFL for tasks assessed/performed Overall Cognitive Status: Within Functional Limits for tasks assessed                                          General Comments      Exercises      Assessment/Plan    PT Assessment Patient needs continued PT services  PT Problem List Decreased strength;Decreased activity tolerance;Decreased balance;Decreased mobility;Decreased safety awareness;Pain       PT Treatment Interventions DME instruction;Gait training;Functional mobility training;Therapeutic activities;Therapeutic exercise;Balance training;Neuromuscular re-education    PT Goals (Current goals can be found in the Care Plan section)  Acute Rehab PT Goals Patient Stated Goal: to go to rehab to get stronger and then get back home. PT Goal Formulation: With patient Time For Goal Achievement: 05/30/22 Potential to Achieve Goals: Good    Frequency 7X/week     Co-evaluation               AM-PAC PT "6 Clicks" Mobility  Outcome Measure Help needed turning from your back to your side while in a flat bed without using bedrails?: A Lot Help needed moving from lying on your back to sitting on the side of a flat bed without using bedrails?: A Lot Help needed moving to and from a bed to a chair (including a wheelchair)?: A Lot Help needed standing up from a chair using your arms (e.g., wheelchair or bedside chair)?: A Lot Help needed  to walk in hospital room?: A Lot Help needed climbing 3-5 steps with a railing? : A Lot 6 Click Score: 12    End of Session Equipment Utilized During Treatment: Gait belt Activity Tolerance: Patient tolerated treatment well Patient left: in chair;with call bell/phone within reach;with chair alarm set Nurse Communication: Mobility status PT Visit Diagnosis: Unsteadiness on feet (R26.81);Other abnormalities of gait and mobility (R26.89);Muscle weakness (generalized) (M62.81);History of falling (Z91.81);Difficulty in walking, not elsewhere classified (R26.2)    Time: 9122-5834 PT Time Calculation (min) (ACUTE ONLY): 38 min   Charges:   PT Evaluation $PT Eval Low Complexity: 1 Low PT Treatments $Gait Training: 8-22 mins $Therapeutic  Activity: 8-22 mins        Gwenlyn Saran, PT, DPT 05/16/22, 2:42 PM   Christie Nottingham 05/16/2022, 2:36 PM

## 2022-05-16 NOTE — Assessment & Plan Note (Signed)
-   Venlafaxine 75 mg p.o. daily with breakfast resumed

## 2022-05-16 NOTE — Assessment & Plan Note (Addendum)
-   s/p mechanical fall resulting in " acute mildly comminuted displaced and angulated fracture involving right humeral neck and greater tuberosity" -Still requiring IV pain control.  Regimen adjusted in efforts to pursue discharge - PT ordered for evaluation as well.  Patient does not think she can return home given acute fracture and living independently previously.  She resides at [facility] and states that they do have skilled nursing available; [facility] consult placed -Patient requesting orthopedic surgery evaluation as well, appreciate assistance.  Recommendation is to maintain sling and NWB right arm with follow up in 3 weeks for repeat imaging

## 2022-05-16 NOTE — Hospital Course (Addendum)
Alison Richardson is an 88 year old female with PMH A-fib on Eliquis, CAD s/p NSTEMI October 2022, OSA, HTN, DM II who presented after a mechanical fall resulting in right arm pain. She underwent multiple imaging studies which were notable for an acute mildly comminuted displaced and angulated fracture involving the right humeral neck and greater tuberosity. She was placed in a sling and admitted for pain control. She was evaluated by orthopedic surgery as well with recommendations for NWB to RUE and followup outpatient for repeat imaging. PT/OT evaluated and she was recommended for SNF at discharge as well. Pain was managed inpatient and she was tolerating PO meds for pain control at discharge.

## 2022-05-16 NOTE — Progress Notes (Signed)
Progress Note    Alison Richardson   HWE:[id number]  DOB: 12-28-1932  DOA: 05/15/2022     0 PCP: Jon Billings, NP  Initial CC: Fall at [facility] Course: Alison Richardson is an 88 year old female with PMH A-fib on Eliquis, CAD s/p NSTEMI October 2022, OSA, HTN, DM II who presented after a mechanical fall resulting in right arm pain. She underwent multiple imaging studies which were notable for an acute mildly comminuted displaced and angulated fracture involving the right humeral neck and greater tuberosity. She was placed in a sling and admitted for pain control.  Interval History:  Still complaining of pain in her right arm this morning but did note she had some relief with Dilaudid.  Discussed that we would adjust pain regimen today.  She is amenable for going to skilled nursing at discharge and states that Alison Richardson has this as well which is her preference.  Tentative plan for discharge Thursday.  Assessment and Plan: * Humerus fracture - s/p mechanical fall resulting in " acute mildly comminuted displaced and angulated fracture involving right humeral neck and greater tuberosity" -Still requiring IV pain control.  Regimen adjusted in efforts to pursue discharge - PT ordered for evaluation as well.  Patient does not think she can return home given acute fracture and living independently previously.  She resides at [facility] and states that they do have skilled nursing available; [facility] consult placed -Patient requesting orthopedic surgery evaluation as well, appreciate assistance.  Recommendation is to maintain sling and NWB right arm with follow up in 3 weeks for repeat imaging  Atrial fibrillation, chronic (HCC) - Eliquis 5 mg p.o. twice daily resumed  PAD (peripheral artery disease) (HCC) -Plavix 75 mg daily resumed  Hyperlipidemia - Pravastatin 20 mg nightly  Hypothyroidism - Levothyroxine 75 mcg resumed  Hypertension associated with diabetes (HCC) - Metoprolol succinate  100 mg daily, isosorbide mononitrate 30 mg daily resumed  Anxiety disorder - Venlafaxine 75 mg p.o. daily with breakfast resumed   Old records reviewed in assessment of this patient  Antimicrobials:   DVT prophylaxis:  Place TED hose Start: 05/15/22 2347 apixaban (ELIQUIS) tablet 5 mg   Code Status:   Code Status: DNR  Disposition Plan: [facility] Thursday Status is: Inpatient  Objective: Blood pressure (!) 151/95, pulse (!) 106, temperature 98.4 F (36.9 C), resp. rate 19, height '5\' 5"'$  (1.651 m), weight 80.3 kg, SpO2 96 %.  Examination:  Physical Exam Constitutional:      General: She is not in acute distress. HENT:     Head: Normocephalic and atraumatic.     Mouth/Throat:     Mouth: Mucous membranes are moist.  Eyes:     Extraocular Movements: Extraocular movements intact.  Cardiovascular:     Rate and Rhythm: Normal rate and regular rhythm.  Pulmonary:     Effort: Pulmonary effort is normal. No respiratory distress.     Breath sounds: Normal breath sounds.  Abdominal:     General: Bowel sounds are normal. There is no distension.     Palpations: Abdomen is soft.     Tenderness: There is no abdominal tenderness.  Musculoskeletal:     Cervical back: Normal range of motion and neck supple.     Comments: Right upper extremity in sling with swelling appreciated around shoulder and minor bruising over proximal humerus. Area also expectedly tender to palpation  Skin:    General: Skin is warm and dry.  Neurological:     General: No focal deficit present.  Mental Status: She is alert.  Psychiatric:        Mood and Affect: Mood normal.        Behavior: Behavior normal.     Consultants:  Orthopedic surgery  Procedures:    Data Reviewed: Results for orders placed or performed during the hospital encounter of 05/15/22 (from the past 24 hour(s))  Comprehensive metabolic panel     Status: Abnormal   Collection Time: 05/15/22  9:03 PM  Result Value Ref Range    Sodium 141 135 - 145 mmol/L   Potassium 4.1 3.5 - 5.1 mmol/L   Chloride 106 98 - 111 mmol/L   CO2 21 (L) 22 - 32 mmol/L   Glucose, Bld 243 (H) 70 - 99 mg/dL   BUN 21 8 - 23 mg/dL   Creatinine, Ser 1.24 (H) 0.44 - 1.00 mg/dL   Calcium 8.8 (L) 8.9 - 10.3 mg/dL   Total Protein 6.5 6.5 - 8.1 g/dL   Albumin 3.8 3.5 - 5.0 g/dL   AST 29 15 - 41 U/L   ALT 21 0 - 44 U/L   Alkaline Phosphatase 98 38 - 126 U/L   Total Bilirubin 0.7 0.3 - 1.2 mg/dL   GFR, Estimated 42 (L) >60 mL/min   Anion gap 14 5 - 15  CBC with Differential     Status: None   Collection Time: 05/15/22  9:03 PM  Result Value Ref Range   WBC 9.8 4.0 - 10.5 K/uL   RBC 4.71 3.87 - 5.11 MIL/uL   Hemoglobin 13.5 12.0 - 15.0 g/dL   HCT 43.5 36.0 - 46.0 %   MCV 92.4 80.0 - 100.0 fL   MCH 28.7 26.0 - 34.0 pg   MCHC 31.0 30.0 - 36.0 g/dL   RDW 14.3 11.5 - 15.5 %   Platelets 305 150 - 400 K/uL   nRBC 0.0 0.0 - 0.2 %   Neutrophils Relative % 58 %   Neutro Abs 5.7 1.7 - 7.7 K/uL   Lymphocytes Relative 30 %   Lymphs Abs 2.9 0.7 - 4.0 K/uL   Monocytes Relative 8 %   Monocytes Absolute 0.8 0.1 - 1.0 K/uL   Eosinophils Relative 2 %   Eosinophils Absolute 0.2 0.0 - 0.5 K/uL   Basophils Relative 1 %   Basophils Absolute 0.1 0.0 - 0.1 K/uL   Immature Granulocytes 1 %   Abs Immature Granulocytes 0.07 0.00 - 0.07 K/uL  Basic metabolic panel     Status: Abnormal   Collection Time: 05/16/22  5:10 AM  Result Value Ref Range   Sodium 140 135 - 145 mmol/L   Potassium 4.9 3.5 - 5.1 mmol/L   Chloride 105 98 - 111 mmol/L   CO2 26 22 - 32 mmol/L   Glucose, Bld 229 (H) 70 - 99 mg/dL   BUN 19 8 - 23 mg/dL   Creatinine, Ser 0.98 0.44 - 1.00 mg/dL   Calcium 8.9 8.9 - 10.3 mg/dL   GFR, Estimated 56 (L) >60 mL/min   Anion gap 9 5 - 15  CBC     Status: Abnormal   Collection Time: 05/16/22  5:10 AM  Result Value Ref Range   WBC 14.2 (H) 4.0 - 10.5 K/uL   RBC 4.30 3.87 - 5.11 MIL/uL   Hemoglobin 12.3 12.0 - 15.0 g/dL   HCT 38.2 36.0 -  46.0 %   MCV 88.8 80.0 - 100.0 fL   MCH 28.6 26.0 - 34.0 pg   MCHC 32.2 30.0 -  36.0 g/dL   RDW 14.1 11.5 - 15.5 %   Platelets 273 150 - 400 K/uL   nRBC 0.0 0.0 - 0.2 %  MRSA Next Gen by PCR, Nasal     Status: None   Collection Time: 05/16/22  6:05 AM   Specimen: Nasal Mucosa; Nasal Swab  Result Value Ref Range   MRSA by PCR Next Gen NOT DETECTED NOT DETECTED    I have Reviewed nursing notes, Vitals, and Lab results since pt's last encounter. Pertinent lab results : see above I have ordered test including BMP, CBC, Mg I have reviewed the last note from staff over past 24 hours I have discussed pt's care plan and test results with nursing staff, case manager   LOS: 0 days   Dwyane Dee, MD [facility] 05/16/2022, 2:00 PM

## 2022-05-16 NOTE — Telephone Encounter (Signed)
Requested medication (s) are due for refill today: yes   Requested medication (s) are on the active medication list: yes  Last refill:  03/19/22 #8 g 0 refills  Future visit scheduled: yes in 1 month  Notes to clinic:  do you want to refill Rx? How many refills.? Doc. Patient not taking 05/16/22. Called patient to verify if she is taking medication, no answer , LVMTCB.     Requested Prescriptions  Pending Prescriptions Disp Refills   albuterol (VENTOLIN HFA) 108 (90 Base) MCG/ACT inhaler [Pharmacy Med Name: ALBUTEROL SULFATE HFA 108 (90 BASE)] 8.5 g     Sig: INHALE 2 PUFFS INTO THE LUNGS EVERY 6 HOURS AS NEEDED FOR WHEEZING OR SHORTNESS OF BREATH     Pulmonology:  Beta Agonists 2 Passed - 05/15/2022 11:58 AM      Passed - Last BP in normal range    BP Readings from Last 1 Encounters:  05/16/22 (!) 151/95         Passed - Last Heart Rate in normal range    Pulse Readings from Last 1 Encounters:  05/16/22 (!) 106         Passed - Valid encounter within last 12 months    Recent Outpatient Visits           1 month ago Chronic diastolic CHF (congestive heart failure) ([place])   [facility] Jon Billings, NP   1 month ago Chronic diastolic CHF (congestive heart failure) ([place])   [place], Jolene T, NP   1 month ago Upper respiratory tract infection, unspecified type   [facility] Vigg, Avanti, MD   2 months ago Benign essential hypertension   [place], Santiago Glad, NP   2 months ago Acute cystitis with hematuria   [facility] Jon Billings, NP       Future Appointments             In 1 month Jon Billings, NP [facility], [place]

## 2022-05-16 NOTE — Assessment & Plan Note (Signed)
-  Plavix 75 mg daily resumed

## 2022-05-16 NOTE — NC FL2 (Signed)
[place] LEVEL OF CARE SCREENING TOOL     IDENTIFICATION  Patient Name: Alison Richardson Birthdate: Feb 08, 1933 Sex: female Admission Date (Current Location): 05/15/2022  [facility] and [place] Number:  Engineering geologist and Address:  [facility], [address] [place], [place] [address]      Provider Number: [id number]  Attending Physician Name and Address:  Dwyane Dee, MD  Relative Name and Phone Number:  [name] Daughter [phone]    Current Level of Care: Hospital Recommended Level of Care: [place] Prior Approval Number:    Date Approved/Denied:   PASRR Number: [id number]  Discharge Plan: SNF    Current Diagnoses: Patient Active Problem List   Diagnosis Date Noted   Humerus fracture 05/16/2022   Atrial fibrillation, chronic ([place]) 05/16/2022   Intractable pain 05/15/2022   Use of cane as ambulatory aid 04/03/2022   History of falling 04/03/2022   History of non-ST elevation myocardial infarction (NSTEMI) 04/03/2022   History of cerebral aneurysm 04/03/2022   Acute cough 04/03/2022   Gastroesophageal reflux disease 02/12/2022   DDD (degenerative disc disease), lumbar    Chronic diastolic CHF (congestive heart failure) ([place]) 10/13/2020   DOE (dyspnea on exertion) 05/09/2020   Cor pulmonale, chronic ([place]) 09/27/2019   Late onset Alzheimer's disease without behavioral disturbance ([place]) 09/23/2019   Obstructive sleep apnea 07/09/2019   Type 2 diabetes mellitus with peripheral neuropathy ([place]) 01/21/2019   PSVT (paroxysmal supraventricular tachycardia) ([place]) 01/21/2019   PAD (peripheral artery disease) ([place]) 01/06/2019   DM type 2 with diabetic mixed hyperlipidemia ([place]) 09/08/2018   Anxiety disorder 06/19/2018   Depression, major, single episode, mild (HCC) 06/19/2018   Vitamin B12 deficiency 06/19/2018   Insomnia 06/19/2018   Protein-calorie malnutrition ([place]) 06/19/2018   Hypertension associated with  diabetes ([place]) 06/19/2018   Hypothyroidism 06/19/2018   Overactive bladder 06/19/2018   Primary osteoarthritis involving multiple joints 06/18/2018   Hyperlipidemia 11/01/2015    Orientation RESPIRATION BLADDER Height & Weight     Self, Time, Situation, Place  Normal Continent Weight: 80.3 kg Height:  '5\' 5"'$  (165.1 cm)  BEHAVIORAL SYMPTOMS/MOOD NEUROLOGICAL BOWEL NUTRITION STATUS      Continent Diet (see DC summary)  AMBULATORY STATUS COMMUNICATION OF NEEDS Skin   Extensive Assist Verbally Normal                       Personal Care Assistance Level of Assistance  Bathing, Dressing, Feeding Bathing Assistance: Limited assistance Feeding assistance: Independent Dressing Assistance: Maximum assistance     Functional Limitations Info             SPECIAL CARE FACTORS FREQUENCY  PT (By licensed PT), OT (By licensed OT)     PT Frequency: 5 times per week OT Frequency: 5 times per week            Contractures Contractures Info: Not present    Additional Factors Info  Code Status, Allergies Code Status Info: DNR Allergies Info: Pantoprazole, Metformin And Related           Current Medications (05/16/2022):  This is the current hospital active medication list Current Facility-Administered Medications  Medication Dose Route Frequency Provider Last Rate Last Admin   acetaminophen (TYLENOL) tablet 1,000 mg  1,000 mg Oral QID Dwyane Dee, MD   1,000 mg at 05/16/22 0946   albuterol (PROVENTIL) (2.5 MG/3ML) 0.083% nebulizer solution 2.5 mg  2.5 mg Inhalation Q6H PRN Cox, Amy N, DO  amiodarone (PACERONE) tablet 200 mg  200 mg Oral Daily Cox, Amy N, DO   200 mg at 05/16/22 0945   apixaban (ELIQUIS) tablet 5 mg  5 mg Oral BID Cox, Amy N, DO   5 mg at 05/16/22 0945   clopidogrel (PLAVIX) tablet 75 mg  75 mg Oral Daily Cox, Amy N, DO   75 mg at 05/16/22 0945   donepezil (ARICEPT) tablet 10 mg  10 mg Oral QHS Cox, Amy N, DO       gabapentin (NEURONTIN) capsule 100  mg  100 mg Oral BID Cox, Amy N, DO   100 mg at 05/16/22 0945   HYDROmorphone (DILAUDID) injection 0.5 mg  0.5 mg Intravenous Q4H PRN Dwyane Dee, MD   0.5 mg at 05/16/22 0946   isosorbide mononitrate (IMDUR) 24 hr tablet 30 mg  30 mg Oral Daily Cox, Amy N, DO   30 mg at 05/16/22 0945   levothyroxine (SYNTHROID) tablet 75 mcg  75 mcg Oral Q0600 Cox, Amy N, DO   75 mcg at 05/16/22 0607   metoprolol succinate (TOPROL-XL) 24 hr tablet 100 mg  100 mg Oral Daily Cox, Amy N, DO   100 mg at 05/16/22 0945   metoprolol tartrate (LOPRESSOR) injection 5 mg  5 mg Intravenous Q3H PRN Cox, Amy N, DO       montelukast (SINGULAIR) tablet 10 mg  10 mg Oral QHS Cox, Amy N, DO       morphine (PF) 4 MG/ML injection 4 mg  4 mg Intravenous Q4H PRN Cox, Amy N, DO   4 mg at 05/16/22 3536   nitroGLYCERIN (NITROSTAT) SL tablet 0.4 mg  0.4 mg Sublingual Q5 min PRN Cox, Amy N, DO       ondansetron (ZOFRAN) tablet 4 mg  4 mg Oral Q6H PRN Cox, Amy N, DO       Or   ondansetron (ZOFRAN) injection 4 mg  4 mg Intravenous Q6H PRN Cox, Amy N, DO       oxyCODONE (Oxy IR/ROXICODONE) immediate release tablet 5 mg  5 mg Oral Q4H PRN Dwyane Dee, MD       pravastatin (PRAVACHOL) tablet 20 mg  20 mg Oral QHS Cox, Amy N, DO   20 mg at 05/16/22 0029   torsemide (DEMADEX) tablet 20 mg  20 mg Oral Daily Cox, Amy N, DO   20 mg at 05/16/22 0945   venlafaxine XR (EFFEXOR-XR) 24 hr capsule 75 mg  75 mg Oral Q breakfast Cox, Amy N, DO   75 mg at 05/16/22 1443     Discharge Medications: Please see discharge summary for a list of discharge medications.  Relevant Imaging Results:  Relevant Lab Results:   Additional Information [id number]  Conception Oms, RN

## 2022-05-17 DIAGNOSIS — I2510 Atherosclerotic heart disease of native coronary artery without angina pectoris: Secondary | ICD-10-CM | POA: Diagnosis not present

## 2022-05-17 DIAGNOSIS — S42251A Displaced fracture of greater tuberosity of right humerus, initial encounter for closed fracture: Secondary | ICD-10-CM | POA: Diagnosis not present

## 2022-05-17 DIAGNOSIS — I4892 Unspecified atrial flutter: Secondary | ICD-10-CM | POA: Diagnosis not present

## 2022-05-17 DIAGNOSIS — I2583 Coronary atherosclerosis due to lipid rich plaque: Secondary | ICD-10-CM | POA: Diagnosis not present

## 2022-05-17 DIAGNOSIS — F0280 Dementia in other diseases classified elsewhere without behavioral disturbance: Secondary | ICD-10-CM | POA: Diagnosis not present

## 2022-05-17 DIAGNOSIS — R350 Frequency of micturition: Secondary | ICD-10-CM | POA: Diagnosis not present

## 2022-05-17 DIAGNOSIS — E782 Mixed hyperlipidemia: Secondary | ICD-10-CM | POA: Diagnosis not present

## 2022-05-17 DIAGNOSIS — S42211D Unspecified displaced fracture of surgical neck of right humerus, subsequent encounter for fracture with routine healing: Secondary | ICD-10-CM | POA: Diagnosis not present

## 2022-05-17 DIAGNOSIS — M8000XA Age-related osteoporosis with current pathological fracture, unspecified site, initial encounter for fracture: Secondary | ICD-10-CM | POA: Diagnosis not present

## 2022-05-17 DIAGNOSIS — R0602 Shortness of breath: Secondary | ICD-10-CM | POA: Diagnosis not present

## 2022-05-17 DIAGNOSIS — R21 Rash and other nonspecific skin eruption: Secondary | ICD-10-CM | POA: Diagnosis not present

## 2022-05-17 DIAGNOSIS — I471 Supraventricular tachycardia: Secondary | ICD-10-CM | POA: Diagnosis not present

## 2022-05-17 DIAGNOSIS — I5033 Acute on chronic diastolic (congestive) heart failure: Secondary | ICD-10-CM | POA: Diagnosis not present

## 2022-05-17 DIAGNOSIS — E1169 Type 2 diabetes mellitus with other specified complication: Secondary | ICD-10-CM | POA: Diagnosis not present

## 2022-05-17 DIAGNOSIS — D72829 Elevated white blood cell count, unspecified: Secondary | ICD-10-CM | POA: Diagnosis not present

## 2022-05-17 DIAGNOSIS — G301 Alzheimer's disease with late onset: Secondary | ICD-10-CM | POA: Diagnosis not present

## 2022-05-17 DIAGNOSIS — I4891 Unspecified atrial fibrillation: Secondary | ICD-10-CM | POA: Diagnosis not present

## 2022-05-17 MED ORDER — ACETAMINOPHEN 500 MG PO TABS: 500.0000 mg | ORAL_TABLET | Freq: Four times a day (QID) | ORAL | 0 refills | Status: AC

## 2022-05-17 MED ORDER — OXYCODONE HCL 5 MG PO TABS: 5.0000 mg | ORAL_TABLET | ORAL | 0 refills | Status: DC | PRN

## 2022-05-17 NOTE — Discharge Summary (Signed)
Physician Discharge Summary   Alison Richardson JWJ:[id number] DOB: [birth year] DOA: 05/15/2022  PCP: Jon Billings, NP  Admit date: 05/15/2022 Discharge date:  05/17/2022  Admitted From: [facility] Disposition:  [facility] Discharging physician: Dwyane Dee, MD  Recommendations for Outpatient Follow-up:  Follow up with orthopedic surgery Continue NWB to [place]:  Equipment/Devices:   Discharge Condition: stable CODE STATUS: DNR Diet recommendation:  Diet Orders (From admission, onward)     Start     Ordered   05/15/22 2348  Diet heart healthy/carb modified Room service appropriate? Yes; Fluid consistency: Thin  Diet effective now       Question Answer Comment  Diet-HS Snack? Nothing   Room service appropriate? Yes   Fluid consistency: Thin      05/15/22 2348            Hospital Course: Alison Richardson is an 88 year old female with PMH A-fib on Eliquis, CAD s/p NSTEMI October 2022, OSA, HTN, DM II who presented after a mechanical fall resulting in right arm pain. She underwent multiple imaging studies which were notable for an acute mildly comminuted displaced and angulated fracture involving the right humeral neck and greater tuberosity. She was placed in a sling and admitted for pain control. She was evaluated by orthopedic surgery as well with recommendations for NWB to [facility] and followup outpatient for repeat imaging. PT/OT evaluated and she was recommended for SNF at discharge as well. Pain was managed inpatient and she was tolerating PO meds for pain control at discharge.   Assessment and Plan: * Humerus fracture - s/p mechanical fall resulting in " acute mildly comminuted displaced and angulated fracture involving right humeral neck and greater tuberosity" - now controlled on PO regimen for pain - appreciate PT/OT evals; plan is for SNF at discharge -evaluated by orthopedic surgery as well: Recommendation is to maintain sling and NWB  right arm with follow up in 3 weeks for repeat imaging  Atrial fibrillation, chronic (HCC) - Eliquis 5 mg p.o. twice daily resumed  PAD (peripheral artery disease) (HCC) -Plavix 75 mg daily resumed  Hyperlipidemia - Pravastatin 20 mg nightly  Hypothyroidism - Levothyroxine 75 mcg resumed  Hypertension associated with diabetes (HCC) - Metoprolol succinate 100 mg daily, isosorbide mononitrate 30 mg daily resumed  Anxiety disorder - Venlafaxine 75 mg p.o. daily with breakfast resumed   Principal Diagnosis: Humerus fracture  Discharge Diagnoses: Principal Problem:   Humerus fracture Active Problems:   Anxiety disorder   Hypertension associated with diabetes ([place])   Hypothyroidism   Hyperlipidemia   PAD (peripheral artery disease) ([place])   Primary osteoarthritis involving multiple joints   Obstructive sleep apnea   History of falling   Gastroesophageal reflux disease   Intractable pain   Atrial fibrillation, chronic (HCC)   Humeral surgical neck fracture    Allergies as of 05/17/2022       Reactions   Pantoprazole Nausea And Vomiting   Metformin And Related Diarrhea        Medication List     STOP taking these medications    fexofenadine 180 MG tablet Commonly known as: Allegra Allergy       TAKE these medications    acetaminophen 500 MG tablet Commonly known as: TYLENOL Take 1 tablet (500 mg total) by mouth in the morning, at noon, in the evening, and at bedtime.   albuterol 108 (90 Base) MCG/ACT inhaler Commonly known as: VENTOLIN HFA INHALE 2 PUFFS INTO THE LUNGS EVERY 6 HOURS AS  NEEDED FOR WHEEZING OR SHORTNESS OF BREATH   amiodarone 200 MG tablet Commonly known as: PACERONE Take 200 mg by mouth daily.   clopidogrel 75 MG tablet Commonly known as: PLAVIX Take 75 mg by mouth daily.   donepezil 10 MG tablet Commonly known as: ARICEPT Take 10 mg by mouth at bedtime.   Eliquis 5 MG Tabs tablet Generic drug: apixaban Take 5 mg by mouth 2  (two) times daily.   gabapentin 100 MG capsule Commonly known as: NEURONTIN Take 100 mg by mouth 2 (two) times daily.   glipiZIDE 2.5 MG 24 hr tablet Commonly known as: GLUCOTROL XL Take 2.5 mg by mouth daily.   isosorbide mononitrate 30 MG 24 hr tablet Commonly known as: IMDUR Take 30 mg by mouth daily.   levothyroxine 75 MCG tablet Commonly known as: SYNTHROID Take 1 tablet (75 mcg total) by mouth daily.   metoprolol succinate 100 MG 24 hr tablet Commonly known as: TOPROL-XL TAKE ONE TABLET (100 MG) BY MOUTH EVERY DAY (TAKE WITH OR IMMEDIATELY AFTER A MEAL)   montelukast 10 MG tablet Commonly known as: SINGULAIR TAKE ONE TABLET BY MOUTH AT BEDTIME   nitroGLYCERIN 0.4 MG SL tablet Commonly known as: NITROSTAT Place 0.4 mg under the tongue every 5 (five) minutes as needed for chest pain.   oxyCODONE 5 MG immediate release tablet Commonly known as: Oxy IR/ROXICODONE Take 1 tablet (5 mg total) by mouth every 4 (four) hours as needed for severe pain or breakthrough pain.   pravastatin 20 MG tablet Commonly known as: PRAVACHOL Take 20 mg by mouth at bedtime.   torsemide 20 MG tablet Commonly known as: DEMADEX Take 1 tablet (20 mg total) by mouth daily.   venlafaxine XR 75 MG 24 hr capsule Commonly known as: EFFEXOR-XR Take 75 mg by mouth daily with breakfast.        Contact information for after-discharge care     Destination     [facility]erred [facility] .   Service: Skilled Nursing Contact information: [place] [address] [phone]                    Allergies  Allergen Reactions   Pantoprazole Nausea And Vomiting   Metformin And Related Diarrhea    Consultations: Orthopedic surgery  Procedures:   Discharge Exam: BP 115/62 (BP Location: Right Arm)   Pulse (!) 107   Temp 99 F (37.2 C) (Oral)   Resp 16   Ht '5\' 5"'$  (1.651 m)   Wt 80.3 kg   SpO2 97%   BMI 29.45 kg/m  Physical  Exam Constitutional:      General: She is not in acute distress. HENT:     Head: Normocephalic and atraumatic.     Mouth/Throat:     Mouth: Mucous membranes are moist.  Eyes:     Extraocular Movements: Extraocular movements intact.  Cardiovascular:     Rate and Rhythm: Normal rate and regular rhythm.  Pulmonary:     Effort: Pulmonary effort is normal. No respiratory distress.     Breath sounds: Normal breath sounds.  Abdominal:     General: Bowel sounds are normal. There is no distension.     Palpations: Abdomen is soft.     Tenderness: There is no abdominal tenderness.  Musculoskeletal:     Cervical back: Normal range of motion and neck supple.     Comments: Right upper extremity in sling with swelling appreciated around shoulder and minor  bruising over proximal humerus. Area also expectedly tender to palpation  Skin:    General: Skin is warm and dry.  Neurological:     General: No focal deficit present.     Mental Status: She is alert.  Psychiatric:        Mood and Affect: Mood normal.        Behavior: Behavior normal.     The results of significant diagnostics from this hospitalization (including imaging, microbiology, ancillary and laboratory) are listed below for reference.   Microbiology: Recent Results (from the past 240 hour(s))  MRSA Next Gen by PCR, Nasal     Status: None   Collection Time: 05/16/22  6:05 AM   Specimen: Nasal Mucosa; Nasal Swab  Result Value Ref Range Status   MRSA by PCR Next Gen NOT DETECTED NOT DETECTED Final    Comment: (NOTE) The GeneXpert MRSA Assay (FDA approved for NASAL specimens only), is one component of a comprehensive MRSA colonization surveillance program. It is not intended to diagnose MRSA infection nor to guide or monitor treatment for MRSA infections. Test performance is not FDA approved in patients less than 38 years old. Performed at [facility], [address] [place], [place] [address]      Labs: BNP (last  3 results) Recent Labs    10/31/21 0941 04/03/22 1203  BNP 314.3* 75.1   Basic Metabolic Panel: Recent Labs  Lab 05/15/22 2103 05/16/22 0510  NA 141 140  K 4.1 4.9  CL 106 105  CO2 21* 26  GLUCOSE 243* 229*  BUN 21 19  CREATININE 1.24* 0.98  CALCIUM 8.8* 8.9   Liver Function Tests: Recent Labs  Lab 05/15/22 2103  AST 29  ALT 21  ALKPHOS 98  BILITOT 0.7  PROT 6.5  ALBUMIN 3.8   No results for input(s): LIPASE, AMYLASE in the last 168 hours. No results for input(s): AMMONIA in the last 168 hours. CBC: Recent Labs  Lab 05/15/22 2103 05/16/22 0510  WBC 9.8 14.2*  NEUTROABS 5.7  --   HGB 13.5 12.3  HCT 43.5 38.2  MCV 92.4 88.8  PLT 305 273   Cardiac Enzymes: No results for input(s): CKTOTAL, CKMB, CKMBINDEX, TROPONINI in the last 168 hours. BNP: Invalid input(s): POCBNP CBG: No results for input(s): GLUCAP in the last 168 hours. D-Dimer No results for input(s): DDIMER in the last 72 hours. Hgb A1c No results for input(s): HGBA1C in the last 72 hours. Lipid Profile No results for input(s): CHOL, HDL, LDLCALC, TRIG, CHOLHDL, LDLDIRECT in the last 72 hours. Thyroid function studies No results for input(s): TSH, T4TOTAL, T3FREE, THYROIDAB in the last 72 hours.  Invalid input(s): FREET3 Anemia work up No results for input(s): VITAMINB12, FOLATE, FERRITIN, TIBC, IRON, RETICCTPCT in the last 72 hours. Urinalysis    Component Value Date/Time   COLORURINE YELLOW 01/25/2022 1043   APPEARANCEUR Cloudy (A) 03/19/2022 1131   LABSPEC 1.015 01/25/2022 1043   PHURINE 5.0 01/25/2022 1043   GLUCOSEU Negative 03/19/2022 1131   HGBUR LARGE (A) 01/25/2022 1043   BILIRUBINUR Negative 03/19/2022 1131   KETONESUR 15 (A) 01/25/2022 1043   PROTEINUR 1+ (A) 03/19/2022 1131   PROTEINUR >300 (A) 01/25/2022 1043   NITRITE Negative 03/19/2022 1131   NITRITE POSITIVE (A) 01/25/2022 1043   LEUKOCYTESUR 2+ (A) 03/19/2022 1131   LEUKOCYTESUR MODERATE (A) 01/25/2022 1043    Sepsis Labs Invalid input(s): PROCALCITONIN,  WBC,  LACTICIDVEN Microbiology Recent Results (from the past 240 hour(s))  MRSA Next Gen  by PCR, Nasal     Status: None   Collection Time: 05/16/22  6:05 AM   Specimen: Nasal Mucosa; Nasal Swab  Result Value Ref Range Status   MRSA by PCR Next Gen NOT DETECTED NOT DETECTED Final    Comment: (NOTE) The GeneXpert MRSA Assay (FDA approved for NASAL specimens only), is one component of a comprehensive MRSA colonization surveillance program. It is not intended to diagnose MRSA infection nor to guide or monitor treatment for MRSA infections. Test performance is not FDA approved in patients less than 25 years old. Performed at [facility], [address] [place], [place] [address]     Procedures/Studies: DG Chest 1 View  Result Date: 05/15/2022 CLINICAL DATA:  Injury fall EXAM: CHEST  1 VIEW COMPARISON:  04/04/2022 FINDINGS: The heart size and mediastinal contours are within normal limits. Both lungs are clear. Aortic atherosclerosis. Incompletely visualized right humerus fracture IMPRESSION: No active disease. Incompletely visualized proximal right humerus fracture Electronically Signed   By: Donavan Foil M.D.   On: 05/15/2022 21:36   DG Shoulder Right  Result Date: 05/15/2022 CLINICAL DATA:  Fall, injury EXAM: RIGHT SHOULDER - 2+ VIEW COMPARISON:  01/26/2022 FINDINGS: AC joint appears intact with mild degenerative changes. Acute mildly comminuted fracture involving right humeral neck and greater tuberosity with valgus angulation of distal fracture fragment. Greater tuberosity fracture fragment appears displaced but the humeral head appears to project over the glenoid. IMPRESSION: Acute mildly comminuted displaced and angulated fracture involving the right humeral neck and greater tuberosity Electronically Signed   By: Donavan Foil M.D.   On: 05/15/2022 21:35   DG Wrist Complete Left  Result Date: 05/15/2022 CLINICAL DATA:   Status post trauma. EXAM: LEFT WRIST - COMPLETE 3+ VIEW COMPARISON:  September 29, 2019 FINDINGS: There is no evidence of an acute fracture or dislocation. Marked severity degenerative changes are seen involving the carpometacarpal articulation of the left thumb. Additional chronic and degenerative changes are seen involving the soft tissues adjacent to the distal left ulna. IMPRESSION: Degenerative changes without an acute osseous abnormality. Electronically Signed   By: Virgina Norfolk M.D.   On: 05/15/2022 23:29   CT HEAD WO CONTRAST (5MM)  Result Date: 05/15/2022 CLINICAL DATA:  Status post fall. EXAM: CT HEAD WITHOUT CONTRAST TECHNIQUE: Contiguous axial images were obtained from the base of the skull through the vertex without intravenous contrast. RADIATION DOSE REDUCTION: This exam was performed according to the departmental dose-optimization program which includes automated exposure control, adjustment of the mA and/or kV according to patient size and/or use of iterative reconstruction technique. COMPARISON:  January 25, 2022 FINDINGS: Brain: There is mild cerebral atrophy with widening of the extra-axial spaces and ventricular dilatation. There are areas of decreased attenuation within the white matter tracts of the supratentorial brain, consistent with microvascular disease changes. Very small, chronic bilateral basal ganglia lacunar infarcts are noted. Vascular: No hyperdense vessel or unexpected calcification. Skull: Normal. Negative for fracture or focal lesion. Sinuses/Orbits: No acute finding. Other: None. IMPRESSION: 1. No acute intracranial abnormality. 2. Generalized cerebral atrophy with chronic white matter small vessel ischemic changes. Electronically Signed   By: Virgina Norfolk M.D.   On: 05/15/2022 22:32   CT Cervical Spine Wo Contrast  Result Date: 05/15/2022 CLINICAL DATA:  Status post fall. EXAM: CT CERVICAL SPINE WITHOUT CONTRAST TECHNIQUE: Multidetector CT imaging of the  cervical spine was performed without intravenous contrast. Multiplanar CT image reconstructions were also generated. RADIATION DOSE REDUCTION: This exam was performed according  to the departmental dose-optimization program which includes automated exposure control, adjustment of the mA and/or kV according to patient size and/or use of iterative reconstruction technique. COMPARISON:  September 29, 2019 FINDINGS: Alignment: Normal. Skull base and vertebrae: No acute fracture. No primary bone lesion or focal pathologic process. Soft tissues and spinal canal: No prevertebral fluid or swelling. No visible canal hematoma. Disc levels: Marked severity endplate sclerosis and mild anterior osteophyte formation are seen throughout the cervical spine. This is most prominent at the levels of C3-C4, C4-C5, C5-C6, C6-C7 and C7-T1. There is moderate to marked severity narrowing of the anterior atlantoaxial articulation. Moderate to marked severity intervertebral disc space narrowing is seen at C3-C4, C4-C5 and C5-C6. Marked severity intervertebral disc space narrowing is seen at C6-C7 and C7-T1. Bilateral moderate to marked severity multilevel facet joint hypertrophy is noted. Upper chest: Negative. Other: None. IMPRESSION: 1. No acute fracture or subluxation of the cervical spine. 2. Marked severity multilevel degenerative changes, as described above. Electronically Signed   By: Virgina Norfolk M.D.   On: 05/15/2022 22:36     Time coordinating discharge: Over 30 minutes    Dwyane Dee, MD  [facility] 05/17/2022, 9:39 AM

## 2022-05-17 NOTE — TOC Progression Note (Signed)
Transition of Care [facility]) - Progression Note    Patient Details  Name: Alison Richardson MRN: [id number] Date of Birth: 1933/08/20  Transition of Care [facility]) CM/SW [place], RN Phone Number: 05/17/2022, 9:56 AM  Clinical Narrative:     [facility] will transport the patient at 37 AM, Ins auth started ref number [id number]       Expected Discharge Plan and Services                                                 Social Determinants of Health (SDOH) Interventions    Readmission Risk Interventions    01/26/2022    2:47 PM 11/02/2021   12:38 PM  Readmission Risk Prevention Plan  Transportation Screening Complete Complete  PCP or Specialist Appt within 3-5 Days Complete Complete  HRI or Home Care Consult Complete Complete  Social Work Consult for [place] Planning/Counseling Complete Complete  Palliative Care Screening Not Applicable Not Applicable  Medication Review [employer]) Complete Complete

## 2022-05-17 NOTE — Progress Notes (Signed)
Blood pressure 115/62, pulse (!) 107, temperature 99 F (37.2 C), temperature source Oral, resp. rate 16, height '5\' 5"'$  (1.651 m), weight 80.3 kg, SpO2 97 %. IV removed site c/d/I discussed d/c packet with pt script and signed DNR sent with pt to facility report successfully called to facility, pt transported via [facility].

## 2022-05-22 DIAGNOSIS — M8000XA Age-related osteoporosis with current pathological fracture, unspecified site, initial encounter for fracture: Secondary | ICD-10-CM | POA: Diagnosis not present

## 2022-05-22 DIAGNOSIS — I2510 Atherosclerotic heart disease of native coronary artery without angina pectoris: Secondary | ICD-10-CM | POA: Diagnosis not present

## 2022-05-22 DIAGNOSIS — I4892 Unspecified atrial flutter: Secondary | ICD-10-CM | POA: Diagnosis not present

## 2022-05-22 DIAGNOSIS — E782 Mixed hyperlipidemia: Secondary | ICD-10-CM | POA: Diagnosis not present

## 2022-05-22 DIAGNOSIS — F0280 Dementia in other diseases classified elsewhere without behavioral disturbance: Secondary | ICD-10-CM | POA: Diagnosis not present

## 2022-05-22 DIAGNOSIS — E1169 Type 2 diabetes mellitus with other specified complication: Secondary | ICD-10-CM | POA: Diagnosis not present

## 2022-05-22 DIAGNOSIS — G301 Alzheimer's disease with late onset: Secondary | ICD-10-CM | POA: Diagnosis not present

## 2022-05-22 DIAGNOSIS — I4891 Unspecified atrial fibrillation: Secondary | ICD-10-CM | POA: Diagnosis not present

## 2022-05-29 ENCOUNTER — Other Ambulatory Visit: Payer: Medicare PPO

## 2022-05-29 DIAGNOSIS — R21 Rash and other nonspecific skin eruption: Secondary | ICD-10-CM | POA: Diagnosis not present

## 2022-06-05 DIAGNOSIS — I5032 Chronic diastolic (congestive) heart failure: Secondary | ICD-10-CM | POA: Diagnosis not present

## 2022-06-06 DIAGNOSIS — S42201A Unspecified fracture of upper end of right humerus, initial encounter for closed fracture: Secondary | ICD-10-CM | POA: Diagnosis not present

## 2022-06-06 DIAGNOSIS — S52515A Nondisplaced fracture of left radial styloid process, initial encounter for closed fracture: Secondary | ICD-10-CM | POA: Diagnosis not present

## 2022-06-07 ENCOUNTER — Encounter: Payer: Self-pay | Admitting: Neurology

## 2022-06-07 ENCOUNTER — Institutional Professional Consult (permissible substitution): Payer: Medicare PPO | Admitting: Neurology

## 2022-06-07 DIAGNOSIS — R1031 Right lower quadrant pain: Secondary | ICD-10-CM | POA: Diagnosis not present

## 2022-06-08 DIAGNOSIS — E1169 Type 2 diabetes mellitus with other specified complication: Secondary | ICD-10-CM | POA: Diagnosis not present

## 2022-06-08 DIAGNOSIS — R1031 Right lower quadrant pain: Secondary | ICD-10-CM | POA: Diagnosis not present

## 2022-06-08 DIAGNOSIS — I5033 Acute on chronic diastolic (congestive) heart failure: Secondary | ICD-10-CM | POA: Diagnosis not present

## 2022-06-09 DIAGNOSIS — R109 Unspecified abdominal pain: Secondary | ICD-10-CM | POA: Diagnosis not present

## 2022-06-18 DIAGNOSIS — M6281 Muscle weakness (generalized): Secondary | ICD-10-CM | POA: Diagnosis not present

## 2022-06-18 DIAGNOSIS — Z9181 History of falling: Secondary | ICD-10-CM | POA: Diagnosis not present

## 2022-06-18 DIAGNOSIS — R2681 Unsteadiness on feet: Secondary | ICD-10-CM | POA: Diagnosis not present

## 2022-06-18 DIAGNOSIS — M25511 Pain in right shoulder: Secondary | ICD-10-CM | POA: Diagnosis not present

## 2022-06-18 DIAGNOSIS — R2689 Other abnormalities of gait and mobility: Secondary | ICD-10-CM | POA: Diagnosis not present

## 2022-06-18 DIAGNOSIS — S42211D Unspecified displaced fracture of surgical neck of right humerus, subsequent encounter for fracture with routine healing: Secondary | ICD-10-CM | POA: Diagnosis not present

## 2022-06-22 ENCOUNTER — Ambulatory Visit: Payer: Self-pay

## 2022-06-22 NOTE — Telephone Encounter (Signed)
  Chief Complaint: Elevated HR unknown number. Pt does not feel well. Symptoms: ibid Frequency: today Pertinent Negatives: Patient denies  Disposition: '[]'$ ED /'[x]'$ Urgent Care (no appt availability in office) / '[]'$ Appointment(In office/virtual)/ '[]'$  [facility]/ '[]'$ Home Care/ '[]'$ Refused Recommended Disposition /'[]'$  Mobile Bus/ '[]'$  Follow-up with PCP Additional Notes: Daughter of pt called. Pt  does not feel well and has an increase in HR. Daughter thinks pt may have a  UTI. Daughter will take pt to UC for treatment. Reason for Disposition  [1] Caller is not with the adult (patient) AND [2] probable NON-URGENT symptoms  Answer Assessment - Initial Assessment Questions 1. REASON FOR CALL or QUESTION: "What is your reason for calling today?" or "How can I best help you?" or "What question do you have that I can help answer?"     Daughter called. Pt is feeling poorly, with elevated HR. Does not know exact number. Daughter thinks pt has a UTI. NO appts available . Daughter will take pt to UC in [place]  Protocols used: Information Only Call - No Triage-A-AH

## 2022-06-22 NOTE — Telephone Encounter (Signed)
FYI to provider

## 2022-06-27 DIAGNOSIS — S42211D Unspecified displaced fracture of surgical neck of right humerus, subsequent encounter for fracture with routine healing: Secondary | ICD-10-CM | POA: Diagnosis not present

## 2022-06-27 DIAGNOSIS — M6281 Muscle weakness (generalized): Secondary | ICD-10-CM | POA: Diagnosis not present

## 2022-06-27 DIAGNOSIS — M25511 Pain in right shoulder: Secondary | ICD-10-CM | POA: Diagnosis not present

## 2022-06-27 DIAGNOSIS — R2681 Unsteadiness on feet: Secondary | ICD-10-CM | POA: Diagnosis not present

## 2022-06-27 DIAGNOSIS — Z9181 History of falling: Secondary | ICD-10-CM | POA: Diagnosis not present

## 2022-06-27 DIAGNOSIS — R2689 Other abnormalities of gait and mobility: Secondary | ICD-10-CM | POA: Diagnosis not present

## 2022-06-29 DIAGNOSIS — Z9181 History of falling: Secondary | ICD-10-CM | POA: Diagnosis not present

## 2022-06-29 DIAGNOSIS — S52515A Nondisplaced fracture of left radial styloid process, initial encounter for closed fracture: Secondary | ICD-10-CM | POA: Diagnosis not present

## 2022-06-29 DIAGNOSIS — S42201A Unspecified fracture of upper end of right humerus, initial encounter for closed fracture: Secondary | ICD-10-CM | POA: Diagnosis not present

## 2022-06-29 DIAGNOSIS — S42211D Unspecified displaced fracture of surgical neck of right humerus, subsequent encounter for fracture with routine healing: Secondary | ICD-10-CM | POA: Diagnosis not present

## 2022-06-29 DIAGNOSIS — R2689 Other abnormalities of gait and mobility: Secondary | ICD-10-CM | POA: Diagnosis not present

## 2022-06-29 DIAGNOSIS — M6281 Muscle weakness (generalized): Secondary | ICD-10-CM | POA: Diagnosis not present

## 2022-06-29 DIAGNOSIS — R2681 Unsteadiness on feet: Secondary | ICD-10-CM | POA: Diagnosis not present

## 2022-07-02 DIAGNOSIS — R2681 Unsteadiness on feet: Secondary | ICD-10-CM | POA: Diagnosis not present

## 2022-07-02 DIAGNOSIS — M6281 Muscle weakness (generalized): Secondary | ICD-10-CM | POA: Diagnosis not present

## 2022-07-02 DIAGNOSIS — M25511 Pain in right shoulder: Secondary | ICD-10-CM | POA: Diagnosis not present

## 2022-07-02 DIAGNOSIS — Z9181 History of falling: Secondary | ICD-10-CM | POA: Diagnosis not present

## 2022-07-02 DIAGNOSIS — R2689 Other abnormalities of gait and mobility: Secondary | ICD-10-CM | POA: Diagnosis not present

## 2022-07-02 DIAGNOSIS — S42211D Unspecified displaced fracture of surgical neck of right humerus, subsequent encounter for fracture with routine healing: Secondary | ICD-10-CM | POA: Diagnosis not present

## 2022-07-04 DIAGNOSIS — Z9181 History of falling: Secondary | ICD-10-CM | POA: Diagnosis not present

## 2022-07-04 DIAGNOSIS — M6281 Muscle weakness (generalized): Secondary | ICD-10-CM | POA: Diagnosis not present

## 2022-07-04 DIAGNOSIS — R2681 Unsteadiness on feet: Secondary | ICD-10-CM | POA: Diagnosis not present

## 2022-07-04 DIAGNOSIS — R2689 Other abnormalities of gait and mobility: Secondary | ICD-10-CM | POA: Diagnosis not present

## 2022-07-04 DIAGNOSIS — M25511 Pain in right shoulder: Secondary | ICD-10-CM | POA: Diagnosis not present

## 2022-07-04 DIAGNOSIS — S42211D Unspecified displaced fracture of surgical neck of right humerus, subsequent encounter for fracture with routine healing: Secondary | ICD-10-CM | POA: Diagnosis not present

## 2022-07-10 DIAGNOSIS — R2681 Unsteadiness on feet: Secondary | ICD-10-CM | POA: Diagnosis not present

## 2022-07-10 DIAGNOSIS — Z9181 History of falling: Secondary | ICD-10-CM | POA: Diagnosis not present

## 2022-07-10 DIAGNOSIS — S42211D Unspecified displaced fracture of surgical neck of right humerus, subsequent encounter for fracture with routine healing: Secondary | ICD-10-CM | POA: Diagnosis not present

## 2022-07-10 DIAGNOSIS — M25511 Pain in right shoulder: Secondary | ICD-10-CM | POA: Diagnosis not present

## 2022-07-10 DIAGNOSIS — M6281 Muscle weakness (generalized): Secondary | ICD-10-CM | POA: Diagnosis not present

## 2022-07-10 DIAGNOSIS — R2689 Other abnormalities of gait and mobility: Secondary | ICD-10-CM | POA: Diagnosis not present

## 2022-07-12 ENCOUNTER — Ambulatory Visit: Payer: Medicare PPO | Admitting: Nurse Practitioner

## 2022-07-12 ENCOUNTER — Encounter: Payer: Self-pay | Admitting: Nurse Practitioner

## 2022-07-12 ENCOUNTER — Telehealth: Payer: Self-pay | Admitting: Nurse Practitioner

## 2022-07-12 VITALS — BP 112/71 | HR 60 | Temp 97.9°F | Wt 169.8 lb

## 2022-07-12 DIAGNOSIS — I482 Chronic atrial fibrillation, unspecified: Secondary | ICD-10-CM

## 2022-07-12 DIAGNOSIS — E039 Hypothyroidism, unspecified: Secondary | ICD-10-CM | POA: Diagnosis not present

## 2022-07-12 DIAGNOSIS — I5032 Chronic diastolic (congestive) heart failure: Secondary | ICD-10-CM | POA: Diagnosis not present

## 2022-07-12 DIAGNOSIS — I2781 Cor pulmonale (chronic): Secondary | ICD-10-CM | POA: Diagnosis not present

## 2022-07-12 DIAGNOSIS — E1142 Type 2 diabetes mellitus with diabetic polyneuropathy: Secondary | ICD-10-CM | POA: Diagnosis not present

## 2022-07-12 DIAGNOSIS — E1159 Type 2 diabetes mellitus with other circulatory complications: Secondary | ICD-10-CM | POA: Diagnosis not present

## 2022-07-12 DIAGNOSIS — E1169 Type 2 diabetes mellitus with other specified complication: Secondary | ICD-10-CM

## 2022-07-12 DIAGNOSIS — I152 Hypertension secondary to endocrine disorders: Secondary | ICD-10-CM

## 2022-07-12 DIAGNOSIS — E782 Mixed hyperlipidemia: Secondary | ICD-10-CM | POA: Diagnosis not present

## 2022-07-12 MED ORDER — OMEPRAZOLE 20 MG PO CPDR: 20.0000 mg | DELAYED_RELEASE_CAPSULE | Freq: Every day | ORAL | 1 refills | Status: DC

## 2022-07-12 MED ORDER — MONTELUKAST SODIUM 10 MG PO TABS: 10.0000 mg | ORAL_TABLET | Freq: Every day | ORAL | 0 refills | Status: DC

## 2022-07-12 NOTE — Assessment & Plan Note (Signed)
Chronic.  Controlled.  Continue with current medication regimen of Metoprolol.  On Eliquis for stroke prophylaxis.  Labs ordered today.  Return to clinic in 3 months for reevaluation.  Call sooner if concerns arise.

## 2022-07-12 NOTE — Progress Notes (Signed)
BP 112/71   Pulse 60   Temp 97.9 F (36.6 C) (Oral)   Wt 169 lb 12.8 oz (77 kg)   SpO2 96%   BMI 28.26 kg/m    Subjective:    Patient ID: Alison Richardson, female    DOB: February 16, 1933, 88 y.o.   MRN: [id number]  HPI: Alison Richardson is a 88 y.o. female  Chief Complaint  Patient presents with   Hypertension    3 month follow up    Hyperlipidemia   Diabetes   Skin Problem    Pt reports very dry skin in the morning.She also reports a lot of burping and would like to discuss this further.   Patient reports she has controlled her fluid pill on her own and needs to take it as rx'd.      HYPERTENSION / HYPERLIPIDEMIA Satisfied with current treatment? yes Duration of hypertension: years BP monitoring frequency: daily BP range: 120/80 BP medication side effects: no Past BP meds:  Imdur Duration of hyperlipidemia: years Cholesterol medication side effects: no Cholesterol supplements: none Past cholesterol medications: pravastatin (pravachol) Medication compliance: excellent compliance Aspirin: no Recent stressors: no Recurrent headaches: no Visual changes: no Palpitations: no Dyspnea: no Chest pain: no Lower extremity edema: no Dizzy/lightheaded: no  DIABETES Hypoglycemic episodes:no Polydipsia/polyuria: no Visual disturbance: no Chest pain: no Paresthesias: no Glucose Monitoring: no  Accucheck frequency: Not Checking  Fasting glucose:  Post prandial:  Evening:  Before meals: Taking Insulin?: no  Long acting insulin:  Short acting insulin: Blood Pressure Monitoring: daily  HYPOTHYROIDISM Thyroid control status:controlled Satisfied with current treatment? yes Medication side effects: no Medication compliance: excellent compliance Etiology of hypothyroidism:  Recent dose adjustment:no Fatigue: no Cold intolerance: no Heat intolerance: no Weight gain: no Weight loss: no Constipation: no Diarrhea/loose stools: no Palpitations: no Lower extremity edema:  no Anxiety/depressed mood: no  Relevant past medical, surgical, family and social history reviewed and updated as indicated. Interim medical history since our last visit reviewed. Allergies and medications reviewed and updated.  Review of Systems  Constitutional:  Negative for fever and unexpected weight change.  Eyes:  Negative for visual disturbance.  Respiratory:  Negative for cough, chest tightness and shortness of breath.   Cardiovascular:  Negative for chest pain, palpitations and leg swelling.  Endocrine: Negative for cold intolerance, polydipsia and polyuria.  Neurological:  Negative for dizziness, light-headedness, numbness and headaches.    Per HPI unless specifically indicated above     Objective:    BP 112/71   Pulse 60   Temp 97.9 F (36.6 C) (Oral)   Wt 169 lb 12.8 oz (77 kg)   SpO2 96%   BMI 28.26 kg/m   Wt Readings from Last 3 Encounters:  07/12/22 169 lb 12.8 oz (77 kg)  05/15/22 177 lb (80.3 kg)  04/12/22 176 lb (79.8 kg)    Physical Exam Vitals and nursing note reviewed.  Constitutional:      General: She is not in acute distress.    Appearance: Normal appearance. She is normal weight. She is not ill-appearing, toxic-appearing or diaphoretic.  HENT:     Head: Normocephalic.     Right Ear: External ear normal.     Left Ear: External ear normal.     Nose: Nose normal.     Mouth/Throat:     Mouth: Mucous membranes are moist.     Pharynx: Oropharynx is clear.  Eyes:     General:        Right eye: No  discharge.        Left eye: No discharge.     Extraocular Movements: Extraocular movements intact.     Conjunctiva/sclera: Conjunctivae normal.     Pupils: Pupils are equal, round, and reactive to light.  Cardiovascular:     Rate and Rhythm: Normal rate and regular rhythm.     Heart sounds: No murmur heard. Pulmonary:     Effort: Pulmonary effort is normal. No respiratory distress.     Breath sounds: Normal breath sounds. No wheezing or rales.   Musculoskeletal:     Cervical back: Normal range of motion and neck supple.  Skin:    General: Skin is warm and dry.     Capillary Refill: Capillary refill takes less than 2 seconds.  Neurological:     General: No focal deficit present.     Mental Status: She is alert and oriented to person, place, and time. Mental status is at baseline.  Psychiatric:        Mood and Affect: Mood normal.        Behavior: Behavior normal.        Thought Content: Thought content normal.        Judgment: Judgment normal.     Results for orders placed or performed during the hospital encounter of 05/15/22  MRSA Next Gen by PCR, Nasal   Specimen: Nasal Mucosa; Nasal Swab  Result Value Ref Range   MRSA by PCR Next Gen NOT DETECTED NOT DETECTED  Comprehensive metabolic panel  Result Value Ref Range   Sodium 141 135 - 145 mmol/L   Potassium 4.1 3.5 - 5.1 mmol/L   Chloride 106 98 - 111 mmol/L   CO2 21 (L) 22 - 32 mmol/L   Glucose, Bld 243 (H) 70 - 99 mg/dL   BUN 21 8 - 23 mg/dL   Creatinine, Ser 1.24 (H) 0.44 - 1.00 mg/dL   Calcium 8.8 (L) 8.9 - 10.3 mg/dL   Total Protein 6.5 6.5 - 8.1 g/dL   Albumin 3.8 3.5 - 5.0 g/dL   AST 29 15 - 41 U/L   ALT 21 0 - 44 U/L   Alkaline Phosphatase 98 38 - 126 U/L   Total Bilirubin 0.7 0.3 - 1.2 mg/dL   GFR, Estimated 42 (L) >60 mL/min   Anion gap 14 5 - 15  CBC with Differential  Result Value Ref Range   WBC 9.8 4.0 - 10.5 K/uL   RBC 4.71 3.87 - 5.11 MIL/uL   Hemoglobin 13.5 12.0 - 15.0 g/dL   HCT 43.5 36.0 - 46.0 %   MCV 92.4 80.0 - 100.0 fL   MCH 28.7 26.0 - 34.0 pg   MCHC 31.0 30.0 - 36.0 g/dL   RDW 14.3 11.5 - 15.5 %   Platelets 305 150 - 400 K/uL   nRBC 0.0 0.0 - 0.2 %   Neutrophils Relative % 58 %   Neutro Abs 5.7 1.7 - 7.7 K/uL   Lymphocytes Relative 30 %   Lymphs Abs 2.9 0.7 - 4.0 K/uL   Monocytes Relative 8 %   Monocytes Absolute 0.8 0.1 - 1.0 K/uL   Eosinophils Relative 2 %   Eosinophils Absolute 0.2 0.0 - 0.5 K/uL   Basophils Relative 1  %   Basophils Absolute 0.1 0.0 - 0.1 K/uL   Immature Granulocytes 1 %   Abs Immature Granulocytes 0.07 0.00 - 0.07 K/uL  Basic metabolic panel  Result Value Ref Range   Sodium 140 135 - 145 mmol/L  Potassium 4.9 3.5 - 5.1 mmol/L   Chloride 105 98 - 111 mmol/L   CO2 26 22 - 32 mmol/L   Glucose, Bld 229 (H) 70 - 99 mg/dL   BUN 19 8 - 23 mg/dL   Creatinine, Ser 0.98 0.44 - 1.00 mg/dL   Calcium 8.9 8.9 - 10.3 mg/dL   GFR, Estimated 56 (L) >60 mL/min   Anion gap 9 5 - 15  CBC  Result Value Ref Range   WBC 14.2 (H) 4.0 - 10.5 K/uL   RBC 4.30 3.87 - 5.11 MIL/uL   Hemoglobin 12.3 12.0 - 15.0 g/dL   HCT 38.2 36.0 - 46.0 %   MCV 88.8 80.0 - 100.0 fL   MCH 28.6 26.0 - 34.0 pg   MCHC 32.2 30.0 - 36.0 g/dL   RDW 14.1 11.5 - 15.5 %   Platelets 273 150 - 400 K/uL   nRBC 0.0 0.0 - 0.2 %      Assessment & Plan:   Problem List Items Addressed This Visit       Cardiovascular and Mediastinum   Hypertension associated with diabetes ([place]) - Primary    Chronic.  Controlled.  Continue with Imdur 68m, Amiodarone 2019mand Metoprolol 10057m Labs ordered today.  Follow up in 3 months for reevaluation.      Relevant Orders   Comp Met (CMET)   Chronic diastolic CHF (congestive heart failure) (HCC)    Chronic, ongoing with current ongoing cough.  Continue to collaborate with cardiology.  With her diabetes may benefit from addition of Jardiance or Far[place] heart if kidney function allows.  Recommend: - Reminded to call for an overnight weight gain of >2 pounds or a weekly weight gain of >5 pounds - not adding salt to food and read food labels. Reviewed the importance of keeping daily sodium intake to <2000m88mily. - Avoid Ibuprofen products.      Cor pulmonale, chronic (HCC)    Chronic, ongoing with current ongoing cough.  Continue to collaborate with cardiology.  With her diabetes may benefit from addition of Jardiance or [place] heart if kidney function allows.  Recommend: - Reminded  to call for an overnight weight gain of >2 pounds or a weekly weight gain of >5 pounds - not adding salt to food and read food labels. Reviewed the importance of keeping daily sodium intake to <2000mg90mly. - Avoid Ibuprofen products.      Atrial fibrillation, chronic (HCC)    Chronic.  Controlled.  Continue with current medication regimen of Metoprolol.  On Eliquis for stroke prophylaxis.  Labs ordered today.  Return to clinic in 3 months for reevaluation.  Call sooner if concerns arise.          Endocrine   Hypothyroidism    Labs ordered today. Will make recommendations based on lab results.      Relevant Orders   TSH   T4, free   Type 2 diabetes mellitus with peripheral neuropathy (HCC)    Chronic. Labs ordered today.  May benefit from [place] HF.  Will make recommendations based on lab results.      Relevant Orders   HgB A1c   DM type 2 with diabetic mixed hyperlipidemia (HCC)    Chronic. Labs ordered today.  May benefit from [place] HF.  Will make recommendations based on lab results.        Other   Hyperlipidemia   Relevant Orders  Lipid Profile     Follow up plan: Return in about 3 months (around 10/12/2022) for HTN, HLD, DM2 FU.

## 2022-07-12 NOTE — Telephone Encounter (Signed)
[facility] is calling to report drug interaction omeprazole (PRILOSEC) 20 MG capsule [[id number]]  &clopidogrel (PLAVIX) 75 MG tablet [[id number]] CB- [phone]

## 2022-07-12 NOTE — Assessment & Plan Note (Signed)
Chronic.  Controlled.  Continue with Imdur '30mg'$ , Amiodarone '200mg'$  and Metoprolol '100mg'$ .  Labs ordered today.  Follow up in 3 months for reevaluation.

## 2022-07-12 NOTE — Assessment & Plan Note (Signed)
Chronic. Labs ordered today.  May benefit from [place] due to HF.  Will make recommendations based on lab results.

## 2022-07-12 NOTE — Assessment & Plan Note (Signed)
Chronic, ongoing with current ongoing cough.  Continue to collaborate with cardiology.  With her diabetes may benefit from addition of Jardiance or [place] for heart if kidney function allows.  Recommend: - Reminded to call for an overnight weight gain of >2 pounds or a weekly weight gain of >5 pounds - not adding salt to food and read food labels. Reviewed the importance of keeping daily sodium intake to '2000mg'$  daily. - Avoid Ibuprofen products.

## 2022-07-12 NOTE — Assessment & Plan Note (Signed)
Labs ordered today.  Will make recommendations based on lab results. ?

## 2022-07-13 DIAGNOSIS — M6281 Muscle weakness (generalized): Secondary | ICD-10-CM | POA: Diagnosis not present

## 2022-07-13 DIAGNOSIS — S42211D Unspecified displaced fracture of surgical neck of right humerus, subsequent encounter for fracture with routine healing: Secondary | ICD-10-CM | POA: Diagnosis not present

## 2022-07-13 DIAGNOSIS — Z9181 History of falling: Secondary | ICD-10-CM | POA: Diagnosis not present

## 2022-07-13 DIAGNOSIS — M25511 Pain in right shoulder: Secondary | ICD-10-CM | POA: Diagnosis not present

## 2022-07-13 DIAGNOSIS — R2681 Unsteadiness on feet: Secondary | ICD-10-CM | POA: Diagnosis not present

## 2022-07-13 DIAGNOSIS — R2689 Other abnormalities of gait and mobility: Secondary | ICD-10-CM | POA: Diagnosis not present

## 2022-07-13 LAB — LIPID PANEL
Chol/HDL Ratio: 4.2 ratio (ref 0.0–4.4)
Cholesterol, Total: 156 mg/dL (ref 100–199)
HDL: 37 mg/dL — ABNORMAL LOW (ref >39)
LDL Chol Calc (NIH): 78 mg/dL (ref 0–99)
Triglycerides: 251 mg/dL — ABNORMAL HIGH (ref 0–149)
VLDL Cholesterol Cal: 41 mg/dL — ABNORMAL HIGH (ref 5–40)

## 2022-07-13 LAB — COMPREHENSIVE METABOLIC PANEL
ALT: 13 IU/L (ref 0–32)
AST: 19 IU/L (ref 0–40)
Albumin/Globulin Ratio: 1.7 (ref 1.2–2.2)
Albumin: 4.1 g/dL (ref 3.7–4.7)
Alkaline Phosphatase: 100 IU/L (ref 44–121)
BUN/Creatinine Ratio: 16 (ref 12–28)
BUN: 12 mg/dL (ref 8–27)
Bilirubin Total: 0.4 mg/dL (ref 0.0–1.2)
CO2: 24 mmol/L (ref 20–29)
Calcium: 9 mg/dL (ref 8.7–10.3)
Chloride: 99 mmol/L (ref 96–106)
Creatinine, Ser: 0.75 mg/dL (ref 0.57–1.00)
Globulin, Total: 2.4 g/dL (ref 1.5–4.5)
Glucose: 162 mg/dL — ABNORMAL HIGH (ref 70–99)
Potassium: 3.3 mmol/L — ABNORMAL LOW (ref 3.5–5.2)
Sodium: 143 mmol/L (ref 134–144)
Total Protein: 6.5 g/dL (ref 6.0–8.5)
eGFR: 77 mL/min/{1.73_m2} (ref >59)

## 2022-07-13 LAB — TSH: TSH: 5.15 u[IU]/mL — ABNORMAL HIGH (ref 0.450–4.500)

## 2022-07-13 LAB — HEMOGLOBIN A1C
Est. average glucose Bld gHb Est-mCnc: 157 mg/dL
Hgb A1c MFr Bld: 7.1 % — ABNORMAL HIGH (ref 4.8–5.6)

## 2022-07-13 LAB — T4, FREE: Free T4: 1.78 ng/dL — ABNORMAL HIGH (ref 0.82–1.77)

## 2022-07-13 NOTE — Progress Notes (Signed)
Please let patient know that her lab work shows that her A1c is well controlled at 7.1. Her thyroid is abnormal.  I would like to increase her dose from 38mg daily to 851m daily. I have sent this to the pharmacy.  We will recheck her labs in 6 weeks as a lab visit.  Please make her a lab appt.    No other concerns at this time.  Follow up as discussed.

## 2022-07-13 NOTE — Addendum Note (Signed)
Addended by: Jon Billings on: 07/13/2022 09:58 AM   Modules accepted: Orders

## 2022-07-16 ENCOUNTER — Telehealth: Payer: Self-pay

## 2022-07-16 DIAGNOSIS — Z9181 History of falling: Secondary | ICD-10-CM | POA: Diagnosis not present

## 2022-07-16 DIAGNOSIS — M6281 Muscle weakness (generalized): Secondary | ICD-10-CM | POA: Diagnosis not present

## 2022-07-16 DIAGNOSIS — R2681 Unsteadiness on feet: Secondary | ICD-10-CM | POA: Diagnosis not present

## 2022-07-16 DIAGNOSIS — S42211D Unspecified displaced fracture of surgical neck of right humerus, subsequent encounter for fracture with routine healing: Secondary | ICD-10-CM | POA: Diagnosis not present

## 2022-07-16 DIAGNOSIS — M25511 Pain in right shoulder: Secondary | ICD-10-CM | POA: Diagnosis not present

## 2022-07-16 DIAGNOSIS — R2689 Other abnormalities of gait and mobility: Secondary | ICD-10-CM | POA: Diagnosis not present

## 2022-07-16 MED ORDER — FAMOTIDINE 20 MG PO TABS: 20.0000 mg | ORAL_TABLET | Freq: Every day | ORAL | 1 refills | Status: DC

## 2022-07-16 NOTE — Telephone Encounter (Signed)
Medication changed to Pepcid.

## 2022-07-16 NOTE — Telephone Encounter (Signed)
Thank you, called pharmacy to let them know of medication change.

## 2022-07-16 NOTE — Telephone Encounter (Signed)
Alison Richardson from [facility] called and stated that there could be a possible drug interaction with the medications omeprazole (Prilosec) and clopidogrel (Plavix). Please advise Judeen Hammans at [phone].

## 2022-07-16 NOTE — Telephone Encounter (Signed)
Spoke to pharmacist, states prilsoec decreases effeciency of plavix- please advise

## 2022-07-17 ENCOUNTER — Telehealth: Payer: Self-pay

## 2022-07-17 MED ORDER — LEVOTHYROXINE SODIUM 88 MCG PO TABS: 88.0000 ug | ORAL_TABLET | Freq: Every day | ORAL | 1 refills | Status: DC

## 2022-07-17 NOTE — Telephone Encounter (Signed)
Called and LVM notifying patient that an increased dose of 88 mcg was sent in for her to start taking. DPR verified for detailed VM.

## 2022-07-17 NOTE — Telephone Encounter (Signed)
Copied from [place] [phone]. Topic: General - Other >> Jul 17, 2022  3:31 PM Everette C wrote: Reason for CRM: The patient has called to follow up on  a previous discussion regarding their lab work and their recently discussed results  The patient shares that they were previously told their levothyroxine (SYNTHROID) 75 MCG tablet [[id number]]  would be increased  The patient would like to know when a new prescription will be submitted to their pharmacy and how much their medication will be increased  Please contact further    Result note states medication was being increased to 88 mcg. Do not see the prescription in the chart. Please advise.

## 2022-07-18 DIAGNOSIS — Z9181 History of falling: Secondary | ICD-10-CM | POA: Diagnosis not present

## 2022-07-18 DIAGNOSIS — S42211D Unspecified displaced fracture of surgical neck of right humerus, subsequent encounter for fracture with routine healing: Secondary | ICD-10-CM | POA: Diagnosis not present

## 2022-07-18 DIAGNOSIS — R2689 Other abnormalities of gait and mobility: Secondary | ICD-10-CM | POA: Diagnosis not present

## 2022-07-18 DIAGNOSIS — R2681 Unsteadiness on feet: Secondary | ICD-10-CM | POA: Diagnosis not present

## 2022-07-18 DIAGNOSIS — M25511 Pain in right shoulder: Secondary | ICD-10-CM | POA: Diagnosis not present

## 2022-07-18 DIAGNOSIS — M6281 Muscle weakness (generalized): Secondary | ICD-10-CM | POA: Diagnosis not present

## 2022-07-21 ENCOUNTER — Other Ambulatory Visit: Payer: Self-pay | Admitting: Nurse Practitioner

## 2022-07-23 DIAGNOSIS — R2681 Unsteadiness on feet: Secondary | ICD-10-CM | POA: Diagnosis not present

## 2022-07-23 DIAGNOSIS — M6281 Muscle weakness (generalized): Secondary | ICD-10-CM | POA: Diagnosis not present

## 2022-07-23 DIAGNOSIS — Z9181 History of falling: Secondary | ICD-10-CM | POA: Diagnosis not present

## 2022-07-23 DIAGNOSIS — R2689 Other abnormalities of gait and mobility: Secondary | ICD-10-CM | POA: Diagnosis not present

## 2022-07-23 DIAGNOSIS — S42211D Unspecified displaced fracture of surgical neck of right humerus, subsequent encounter for fracture with routine healing: Secondary | ICD-10-CM | POA: Diagnosis not present

## 2022-07-23 NOTE — Telephone Encounter (Signed)
Requested Prescriptions  Pending Prescriptions Disp Refills  . metoprolol succinate (TOPROL-XL) 100 MG 24 hr tablet [Pharmacy Med Name: METOPROLOL SUCCINATE ER 100 MG TAB] 90 tablet 0    Sig: TAKE ONE TABLET (100 MG) BY MOUTH EVERY DAY (TAKE WITH OR IMMEDIATELY AFTER A MEAL)     Cardiovascular:  Beta Blockers Passed - 07/21/2022  2:00 PM      Passed - Last BP in normal range    BP Readings from Last 1 Encounters:  07/12/22 112/71         Passed - Last Heart Rate in normal range    Pulse Readings from Last 1 Encounters:  07/12/22 60         Passed - Valid encounter within last 6 months    Recent Outpatient Visits          1 week ago Hypertension associated with diabetes ([place])   [facility] Jon Billings, NP   3 months ago Chronic diastolic CHF (congestive heart failure) ([place])   [facility] Jon Billings, NP   3 months ago Chronic diastolic CHF (congestive heart failure) ([place])   [facility] [place], Jolene T, NP   4 months ago Upper respiratory tract infection, unspecified type   [facility], Avanti, MD   4 months ago Benign essential hypertension   [facility] Jon Billings, NP      Future Appointments            In 2 months Jon Billings, NP [facility] [place]

## 2022-07-25 DIAGNOSIS — E039 Hypothyroidism, unspecified: Secondary | ICD-10-CM | POA: Diagnosis not present

## 2022-07-25 DIAGNOSIS — E1140 Type 2 diabetes mellitus with diabetic neuropathy, unspecified: Secondary | ICD-10-CM | POA: Diagnosis not present

## 2022-07-25 DIAGNOSIS — Z833 Family history of diabetes mellitus: Secondary | ICD-10-CM | POA: Diagnosis not present

## 2022-07-25 DIAGNOSIS — E1159 Type 2 diabetes mellitus with other circulatory complications: Secondary | ICD-10-CM | POA: Diagnosis not present

## 2022-07-25 DIAGNOSIS — I25119 Atherosclerotic heart disease of native coronary artery with unspecified angina pectoris: Secondary | ICD-10-CM | POA: Diagnosis not present

## 2022-07-25 DIAGNOSIS — E1151 Type 2 diabetes mellitus with diabetic peripheral angiopathy without gangrene: Secondary | ICD-10-CM | POA: Diagnosis not present

## 2022-07-25 DIAGNOSIS — E1143 Type 2 diabetes mellitus with diabetic autonomic (poly)neuropathy: Secondary | ICD-10-CM | POA: Diagnosis not present

## 2022-07-25 DIAGNOSIS — D6869 Other thrombophilia: Secondary | ICD-10-CM | POA: Diagnosis not present

## 2022-07-25 DIAGNOSIS — Z85828 Personal history of other malignant neoplasm of skin: Secondary | ICD-10-CM | POA: Diagnosis not present

## 2022-07-25 DIAGNOSIS — E785 Hyperlipidemia, unspecified: Secondary | ICD-10-CM | POA: Diagnosis not present

## 2022-07-25 DIAGNOSIS — I4891 Unspecified atrial fibrillation: Secondary | ICD-10-CM | POA: Diagnosis not present

## 2022-07-26 DIAGNOSIS — Z9181 History of falling: Secondary | ICD-10-CM | POA: Diagnosis not present

## 2022-07-26 DIAGNOSIS — S42211D Unspecified displaced fracture of surgical neck of right humerus, subsequent encounter for fracture with routine healing: Secondary | ICD-10-CM | POA: Diagnosis not present

## 2022-07-26 DIAGNOSIS — R2689 Other abnormalities of gait and mobility: Secondary | ICD-10-CM | POA: Diagnosis not present

## 2022-07-26 DIAGNOSIS — M25511 Pain in right shoulder: Secondary | ICD-10-CM | POA: Diagnosis not present

## 2022-07-26 DIAGNOSIS — M6281 Muscle weakness (generalized): Secondary | ICD-10-CM | POA: Diagnosis not present

## 2022-07-26 DIAGNOSIS — R2681 Unsteadiness on feet: Secondary | ICD-10-CM | POA: Diagnosis not present

## 2022-07-27 DIAGNOSIS — S52515A Nondisplaced fracture of left radial styloid process, initial encounter for closed fracture: Secondary | ICD-10-CM | POA: Diagnosis not present

## 2022-07-27 DIAGNOSIS — S42201A Unspecified fracture of upper end of right humerus, initial encounter for closed fracture: Secondary | ICD-10-CM | POA: Diagnosis not present

## 2022-07-30 DIAGNOSIS — R2689 Other abnormalities of gait and mobility: Secondary | ICD-10-CM | POA: Diagnosis not present

## 2022-07-30 DIAGNOSIS — M25511 Pain in right shoulder: Secondary | ICD-10-CM | POA: Diagnosis not present

## 2022-07-30 DIAGNOSIS — M6281 Muscle weakness (generalized): Secondary | ICD-10-CM | POA: Diagnosis not present

## 2022-07-30 DIAGNOSIS — R2681 Unsteadiness on feet: Secondary | ICD-10-CM | POA: Diagnosis not present

## 2022-07-30 DIAGNOSIS — Z9181 History of falling: Secondary | ICD-10-CM | POA: Diagnosis not present

## 2022-07-30 DIAGNOSIS — S42211D Unspecified displaced fracture of surgical neck of right humerus, subsequent encounter for fracture with routine healing: Secondary | ICD-10-CM | POA: Diagnosis not present

## 2022-08-01 DIAGNOSIS — Z9181 History of falling: Secondary | ICD-10-CM | POA: Diagnosis not present

## 2022-08-01 DIAGNOSIS — R2689 Other abnormalities of gait and mobility: Secondary | ICD-10-CM | POA: Diagnosis not present

## 2022-08-01 DIAGNOSIS — R2681 Unsteadiness on feet: Secondary | ICD-10-CM | POA: Diagnosis not present

## 2022-08-01 DIAGNOSIS — M25511 Pain in right shoulder: Secondary | ICD-10-CM | POA: Diagnosis not present

## 2022-08-01 DIAGNOSIS — S42211D Unspecified displaced fracture of surgical neck of right humerus, subsequent encounter for fracture with routine healing: Secondary | ICD-10-CM | POA: Diagnosis not present

## 2022-08-01 DIAGNOSIS — M6281 Muscle weakness (generalized): Secondary | ICD-10-CM | POA: Diagnosis not present

## 2022-08-06 DIAGNOSIS — R2689 Other abnormalities of gait and mobility: Secondary | ICD-10-CM | POA: Diagnosis not present

## 2022-08-06 DIAGNOSIS — S42211D Unspecified displaced fracture of surgical neck of right humerus, subsequent encounter for fracture with routine healing: Secondary | ICD-10-CM | POA: Diagnosis not present

## 2022-08-06 DIAGNOSIS — Z9181 History of falling: Secondary | ICD-10-CM | POA: Diagnosis not present

## 2022-08-06 DIAGNOSIS — R2681 Unsteadiness on feet: Secondary | ICD-10-CM | POA: Diagnosis not present

## 2022-08-06 DIAGNOSIS — M6281 Muscle weakness (generalized): Secondary | ICD-10-CM | POA: Diagnosis not present

## 2022-08-06 DIAGNOSIS — M25511 Pain in right shoulder: Secondary | ICD-10-CM | POA: Diagnosis not present

## 2022-08-08 DIAGNOSIS — M25511 Pain in right shoulder: Secondary | ICD-10-CM | POA: Diagnosis not present

## 2022-08-08 DIAGNOSIS — Z9181 History of falling: Secondary | ICD-10-CM | POA: Diagnosis not present

## 2022-08-08 DIAGNOSIS — M6281 Muscle weakness (generalized): Secondary | ICD-10-CM | POA: Diagnosis not present

## 2022-08-08 DIAGNOSIS — S42211D Unspecified displaced fracture of surgical neck of right humerus, subsequent encounter for fracture with routine healing: Secondary | ICD-10-CM | POA: Diagnosis not present

## 2022-08-08 DIAGNOSIS — R2689 Other abnormalities of gait and mobility: Secondary | ICD-10-CM | POA: Diagnosis not present

## 2022-08-08 DIAGNOSIS — R2681 Unsteadiness on feet: Secondary | ICD-10-CM | POA: Diagnosis not present

## 2022-08-13 DIAGNOSIS — M6281 Muscle weakness (generalized): Secondary | ICD-10-CM | POA: Diagnosis not present

## 2022-08-13 DIAGNOSIS — S42211D Unspecified displaced fracture of surgical neck of right humerus, subsequent encounter for fracture with routine healing: Secondary | ICD-10-CM | POA: Diagnosis not present

## 2022-08-13 DIAGNOSIS — R2689 Other abnormalities of gait and mobility: Secondary | ICD-10-CM | POA: Diagnosis not present

## 2022-08-13 DIAGNOSIS — Z9181 History of falling: Secondary | ICD-10-CM | POA: Diagnosis not present

## 2022-08-13 DIAGNOSIS — M25511 Pain in right shoulder: Secondary | ICD-10-CM | POA: Diagnosis not present

## 2022-08-13 DIAGNOSIS — R2681 Unsteadiness on feet: Secondary | ICD-10-CM | POA: Diagnosis not present

## 2022-08-15 DIAGNOSIS — M25511 Pain in right shoulder: Secondary | ICD-10-CM | POA: Diagnosis not present

## 2022-08-15 DIAGNOSIS — R2689 Other abnormalities of gait and mobility: Secondary | ICD-10-CM | POA: Diagnosis not present

## 2022-08-15 DIAGNOSIS — Z9181 History of falling: Secondary | ICD-10-CM | POA: Diagnosis not present

## 2022-08-15 DIAGNOSIS — M6281 Muscle weakness (generalized): Secondary | ICD-10-CM | POA: Diagnosis not present

## 2022-08-15 DIAGNOSIS — S42211D Unspecified displaced fracture of surgical neck of right humerus, subsequent encounter for fracture with routine healing: Secondary | ICD-10-CM | POA: Diagnosis not present

## 2022-08-15 DIAGNOSIS — R2681 Unsteadiness on feet: Secondary | ICD-10-CM | POA: Diagnosis not present

## 2022-08-20 DIAGNOSIS — S42211D Unspecified displaced fracture of surgical neck of right humerus, subsequent encounter for fracture with routine healing: Secondary | ICD-10-CM | POA: Diagnosis not present

## 2022-08-20 DIAGNOSIS — M6281 Muscle weakness (generalized): Secondary | ICD-10-CM | POA: Diagnosis not present

## 2022-08-20 DIAGNOSIS — Z9181 History of falling: Secondary | ICD-10-CM | POA: Diagnosis not present

## 2022-08-20 DIAGNOSIS — M25511 Pain in right shoulder: Secondary | ICD-10-CM | POA: Diagnosis not present

## 2022-08-20 DIAGNOSIS — R2681 Unsteadiness on feet: Secondary | ICD-10-CM | POA: Diagnosis not present

## 2022-08-20 DIAGNOSIS — R2689 Other abnormalities of gait and mobility: Secondary | ICD-10-CM | POA: Diagnosis not present

## 2022-08-21 ENCOUNTER — Ambulatory Visit: Payer: Medicare PPO | Attending: Family | Admitting: Family

## 2022-08-21 ENCOUNTER — Encounter: Payer: Self-pay | Admitting: Family

## 2022-08-21 VITALS — BP 95/53 | HR 64 | Resp 16 | Ht 66.0 in | Wt 173.1 lb

## 2022-08-21 DIAGNOSIS — I509 Heart failure, unspecified: Secondary | ICD-10-CM | POA: Diagnosis present

## 2022-08-21 DIAGNOSIS — G4730 Sleep apnea, unspecified: Secondary | ICD-10-CM | POA: Insufficient documentation

## 2022-08-21 DIAGNOSIS — I10 Essential (primary) hypertension: Secondary | ICD-10-CM | POA: Diagnosis not present

## 2022-08-21 DIAGNOSIS — I482 Chronic atrial fibrillation, unspecified: Secondary | ICD-10-CM | POA: Diagnosis not present

## 2022-08-21 DIAGNOSIS — Z7902 Long term (current) use of antithrombotics/antiplatelets: Secondary | ICD-10-CM | POA: Insufficient documentation

## 2022-08-21 DIAGNOSIS — I5022 Chronic systolic (congestive) heart failure: Secondary | ICD-10-CM | POA: Diagnosis not present

## 2022-08-21 DIAGNOSIS — Z85828 Personal history of other malignant neoplasm of skin: Secondary | ICD-10-CM | POA: Insufficient documentation

## 2022-08-21 DIAGNOSIS — Z7984 Long term (current) use of oral hypoglycemic drugs: Secondary | ICD-10-CM | POA: Insufficient documentation

## 2022-08-21 DIAGNOSIS — E782 Mixed hyperlipidemia: Secondary | ICD-10-CM | POA: Diagnosis not present

## 2022-08-21 DIAGNOSIS — E785 Hyperlipidemia, unspecified: Secondary | ICD-10-CM | POA: Diagnosis not present

## 2022-08-21 DIAGNOSIS — G301 Alzheimer's disease with late onset: Secondary | ICD-10-CM | POA: Diagnosis not present

## 2022-08-21 DIAGNOSIS — I4891 Unspecified atrial fibrillation: Secondary | ICD-10-CM | POA: Insufficient documentation

## 2022-08-21 DIAGNOSIS — E039 Hypothyroidism, unspecified: Secondary | ICD-10-CM | POA: Diagnosis not present

## 2022-08-21 DIAGNOSIS — S42251S Displaced fracture of greater tuberosity of right humerus, sequela: Secondary | ICD-10-CM

## 2022-08-21 DIAGNOSIS — Z8249 Family history of ischemic heart disease and other diseases of the circulatory system: Secondary | ICD-10-CM | POA: Diagnosis not present

## 2022-08-21 DIAGNOSIS — I110 Hypertensive heart disease with heart failure: Secondary | ICD-10-CM | POA: Diagnosis not present

## 2022-08-21 DIAGNOSIS — Z833 Family history of diabetes mellitus: Secondary | ICD-10-CM | POA: Diagnosis not present

## 2022-08-21 DIAGNOSIS — E1169 Type 2 diabetes mellitus with other specified complication: Secondary | ICD-10-CM

## 2022-08-21 DIAGNOSIS — E1151 Type 2 diabetes mellitus with diabetic peripheral angiopathy without gangrene: Secondary | ICD-10-CM | POA: Diagnosis not present

## 2022-08-21 DIAGNOSIS — S42201D Unspecified fracture of upper end of right humerus, subsequent encounter for fracture with routine healing: Secondary | ICD-10-CM | POA: Insufficient documentation

## 2022-08-21 DIAGNOSIS — Z7901 Long term (current) use of anticoagulants: Secondary | ICD-10-CM | POA: Insufficient documentation

## 2022-08-21 NOTE — Progress Notes (Signed)
Patient ID: Alison Richardson, female    DOB: 1933/08/11, 88 y.o.   MRN: [id number]  HPI  [name] is a 88 y/o female with a history of skin cancer, DM, hyperlipidemia, HTN, thyroid disease, GERD, cerebral aneurysm, atrial flutter, mild pulmonary HTN, PVD, sleep apnea, late onset alzheimer's disease and chronic heart failure.   Echo report from 10/31/21 reviewed and showed an EF of 45-50% along with mild MR. Echo report from 10/07/20 reviewed and showed an EF of 60-65% along with mild MR but without regional wall motion abnormalities.   Admitted 05/15/22 due to mechanical fall and found to have a right comminuted proximal humerus fracture. Surgical consult obtained. No surgical intervention needed and placed in sling. Discharged after 2 days.   She presents today for a follow-up visit although hasn't been seen since December 2021. She presents with a chief complaint of moderate fatigue with little exertion. Describes this as chronic in nature. She has associated cough, SOB, difficulty sleeping, headaches and light-headedness along with this. She denies any chest pain, pedal edema, palpitations, abdominal distention or weight gain.   She says that she's supposed to be wearing CPAP but has not been able to get adjusted to it. Does admit to sleeping more during the day.   As the visit progresses, she says that she thought she was here to exercise.   Past Medical History:  Diagnosis Date   Arrhythmia    Atrial flutter ([place]) 01/2018   new onset    Basal cell carcinoma of back    Basal cell carcinoma of lip    Cerebral aneurysm    followed by [facility]   CHF (congestive heart failure) (HCC)    DDD (degenerative disc disease), lumbar    superior plate depression, L3 08/18/2014   Diabetes mellitus type II, controlled ([place])    Diverticulosis    Dysrhythmia    Paroxysmal Supraventricular Tachycardia   Dysthymia    depression   GERD (gastroesophageal reflux disease)    History of meniscal tear     Hyperlipidemia    Hypertension    Hypothyroidism    Late onset Alzheimer's disease with behavioral disturbance ([place])    Mild pulmonary hypertension ([place])    Overactive bladder    Peripheral vascular disease ([place])    Seasonal allergic rhinitis    Sleep apnea    Past Surgical History:  Procedure Laterality Date   APPENDECTOMY  1946   BASAL CELL CARCINOMA EXCISION     2006 and 2009 removed from back and lip   CARDIOVASCULAR STRESS TEST  2015   nuclear cardiac stress test negative for ischemia - Dr. Satira Sark   CATARACT EXTRACTION, BILATERAL  2006   COLONOSCOPY  2014   COLONOSCOPY N/A 08/19/2020   Procedure: COLONOSCOPY;  Surgeon: Lesly Rubenstein, MD;  Location: [facility] ENDOSCOPY;  Service: Endoscopy;  Laterality: N/A;   ECTOPIC PREGNANCY SURGERY  1957   ESOPHAGOGASTRODUODENOSCOPY N/A 08/19/2020   Procedure: ESOPHAGOGASTRODUODENOSCOPY (EGD);  Surgeon: Lesly Rubenstein, MD;  Location[facility] ENDOSCOPY;  Service: Endoscopy;  Laterality: N/A;   INJECTION KNEE Left 05/2015   LEFT HEART CATH AND CORONARY ANGIOGRAPHY N/A 10/12/2021   Procedure: LEFT HEART CATH AND CORONARY ANGIOGRAPHY;  Surgeon: Corey Skains, MD;  Location: [facility] CV LAB;  Service: Cardiovascular;  Laterality: N/A;   REPLACEMENT TOTAL KNEE Left 09/06/2016   Dr. Barnet Pall   TONSILLECTOMY AND ADENOIDECTOMY  1953   TOTAL ABDOMINAL HYSTERECTOMY  1986   [facility] B   Family History  Problem Relation Age of Onset   Hypertension Mother    Heart disease Father    CAD Father    Heart disease Sister    Diabetes Sister    Diabetes Paternal Uncle    Social History   Tobacco Use   Smoking status: Never   Smokeless tobacco: Never  Substance Use Topics   Alcohol use: Yes    Alcohol/week: 3.0 standard drinks of alcohol    Types: 3 Shots of liquor per week    Comment: 3 x week    Allergies  Allergen Reactions   Pantoprazole Nausea And Vomiting   Metformin And Related Diarrhea   Prior to Admission medications    Medication Sig Start Date End Date Taking? Authorizing Provider  albuterol (VENTOLIN HFA) 108 (90 Base) MCG/ACT inhaler INHALE 2 PUFFS INTO THE LUNGS EVERY 6 HOURS AS NEEDED FOR WHEEZING OR SHORTNESS OF BREATH 05/16/22  Yes Jon Billings, NP  amiodarone (PACERONE) 200 MG tablet Take 200 mg by mouth daily.  01/02/19  Yes [provider]  clopidogrel (PLAVIX) 75 MG tablet Take 75 mg by mouth daily.   Yes [provider]  donepezil (ARICEPT) 10 MG tablet Take 10 mg by mouth at bedtime.   Yes [provider]  ELIQUIS 5 MG TABS tablet Take 5 mg by mouth 2 (two) times daily.  05/06/20  Yes [provider]  famotidine (PEPCID) 20 MG tablet Take 1 tablet (20 mg total) by mouth daily. 07/16/22  Yes Jon Billings, NP  gabapentin (NEURONTIN) 100 MG capsule Take 100 mg by mouth 2 (two) times daily. 05/06/20  Yes [provider]  glipiZIDE (GLUCOTROL XL) 2.5 MG 24 hr tablet Take 2.5 mg by mouth daily. 09/19/21  Yes [provider]  isosorbide mononitrate (IMDUR) 30 MG 24 hr tablet Take 30 mg by mouth daily.   Yes [provider]  levothyroxine (SYNTHROID) 88 MCG tablet Take 1 tablet (88 mcg total) by mouth daily. 07/17/22  Yes Jon Billings, NP  metoprolol succinate (TOPROL-XL) 100 MG 24 hr tablet TAKE ONE TABLET (100 MG) BY MOUTH EVERY DAY (TAKE WITH OR IMMEDIATELY AFTER A MEAL) 07/23/22  Yes Jon Billings, NP  nitroGLYCERIN (NITROSTAT) 0.4 MG SL tablet Place 0.4 mg under the tongue every 5 (five) minutes as needed for chest pain.   Yes [provider]  omeprazole (PRILOSEC) 20 MG capsule Take 1 capsule (20 mg total) by mouth daily. 07/12/22  Yes Jon Billings, NP  oxyCODONE (OXY IR/ROXICODONE) 5 MG immediate release tablet Take 1 tablet (5 mg total) by mouth every 4 (four) hours as needed for severe pain or breakthrough pain. Patient taking differently: Take 5 mg by mouth every 4 (four) hours as needed for severe pain or  breakthrough pain. Taking for shoulder pain on physical therapy days 05/17/22  Yes Dwyane Dee, MD  pravastatin (PRAVACHOL) 20 MG tablet Take 20 mg by mouth at bedtime. 07/05/20  Yes [provider]  torsemide (DEMADEX) 20 MG tablet Take 1 tablet (20 mg total) by mouth daily. 11/03/21  Yes Richarda Osmond, MD  venlafaxine XR (EFFEXOR-XR) 75 MG 24 hr capsule Take 75 mg by mouth daily with breakfast.    Yes [provider]  montelukast (SINGULAIR) 10 MG tablet Take 1 tablet (10 mg total) by mouth at bedtime. Patient not taking: Reported on 08/21/2022 07/12/22   Jon Billings, NP   Review of Systems  Constitutional:  Positive for fatigue (easily). Negative for appetite change.  HENT:  Positive  for congestion. Negative for postnasal drip and sore throat.   Eyes: Negative.   Respiratory:  Positive for cough (dry cough) and shortness of breath. Negative for chest tightness.   Cardiovascular:  Negative for chest pain, palpitations and leg swelling.  Gastrointestinal:  Negative for abdominal distention and abdominal pain.  Endocrine: Negative.   Genitourinary: Negative.   Musculoskeletal:  Positive for arthralgias (right shoulder). Negative for back pain.  Skin: Negative.   Allergic/Immunologic: Negative.   Neurological:  Positive for light-headedness (when changing positions quickly) and headaches (due to right shoulder pain). Negative for dizziness.  Hematological:  Negative for adenopathy. Does not bruise/bleed easily.  Psychiatric/Behavioral:  Positive for sleep disturbance (sleeping on 1 pillow). Negative for dysphoric mood. The patient is not nervous/anxious.    Vitals:   08/21/22 1336  BP: (!) 95/53  Pulse: 64  Resp: 16  SpO2: 95%  Weight: 173 lb 2 oz (78.5 kg)  Height: '5\' 6"'$  (1.676 m)   Wt Readings from Last 3 Encounters:  08/21/22 173 lb 2 oz (78.5 kg)  07/12/22 169 lb 12.8 oz (77 kg)  05/15/22 177 lb (80.3 kg)   Lab Results  Component Value Date    CREATININE 0.75 07/12/2022   CREATININE 0.98 05/16/2022   CREATININE 1.24 (H) 05/15/2022   Physical Exam Vitals and nursing note reviewed.  Constitutional:      Appearance: She is well-developed.  HENT:     Head: Normocephalic and atraumatic.  Cardiovascular:     Rate and Rhythm: Normal rate and regular rhythm.  Pulmonary:     Effort: Pulmonary effort is normal. No respiratory distress.     Breath sounds: No wheezing or rales.  Abdominal:     Palpations: Abdomen is soft.     Tenderness: There is no abdominal tenderness.  Musculoskeletal:     Right lower leg: No tenderness. No edema.     Left lower leg: No tenderness. No edema.  Skin:    General: Skin is warm and dry.  Neurological:     General: No focal deficit present.     Mental Status: She is alert and oriented to person, place, and time.  Psychiatric:        Mood and Affect: Mood normal.        Behavior: Behavior normal.    Assessment & Plan:  1: Chronic heart failure with mildly reduced ejection fraction - - NYHA class III - euvolemic today - weighing daily; reminded to call for an overnight weight gain of >2 pounds or a weekly weight gain of >5 pounds - not adding additional salt to her food but does eat at the cafeteria for her meals at [facility] - advised to drink between 50-64 ounces of fluids daily - on GDMT of metoprolol - current BP does not all for other GDMT - patient says that she thought she was here to exercise after her cardiologist put in referral; when looking in record, there is a referral to cardiac rehab; brochure provided and told patient to call them to follow-up regarding this referral - BNP 04/03/22 was 65.1  2: HTN- - BP on the low side (95/53) - saw PCP Mathis Dad) 07/12/22 - BMP 07/12/22 reviewed and showed sodium 143, potassium 3.3, creatinine 0.75 and GFR 77  3: DM- - A1c 07/12/22 was 7.1% - fasting glucose at home today was 178  4: Right arm fracture- - saw orthopaedics Mack Guise)  07/27/22  5: Atrial fibrillation- - saw cardiology Corky Sox) 04/16/22 - currently taking amiodarone and  apixaban   Patient did not bring her medications nor a list. Each medication was verbally reviewed with the patient and she was encouraged to bring the bottles to every visit to confirm accuracy of list.   Due to HF stability, will not make a return appointment at this time. Advised patient that she could call back at anytime for questions or to make another appointment and she was comfortable with this plan.

## 2022-08-21 NOTE — Patient Instructions (Addendum)
Continue weighing daily and call for an overnight weight gain of 3 pounds or more or a weekly weight gain of more than 5 pounds. ? ? ?If you have voicemail, please make sure your mailbox is cleaned out so that we may leave a message and please make sure to listen to any voicemails.  ? ? ?Call us in the future if you need us for anything ?

## 2022-08-22 DIAGNOSIS — I739 Peripheral vascular disease, unspecified: Secondary | ICD-10-CM | POA: Diagnosis not present

## 2022-08-22 DIAGNOSIS — Z9181 History of falling: Secondary | ICD-10-CM | POA: Diagnosis not present

## 2022-08-22 DIAGNOSIS — E782 Mixed hyperlipidemia: Secondary | ICD-10-CM | POA: Diagnosis not present

## 2022-08-22 DIAGNOSIS — M6281 Muscle weakness (generalized): Secondary | ICD-10-CM | POA: Diagnosis not present

## 2022-08-22 DIAGNOSIS — E1169 Type 2 diabetes mellitus with other specified complication: Secondary | ICD-10-CM | POA: Diagnosis not present

## 2022-08-22 DIAGNOSIS — I471 Supraventricular tachycardia: Secondary | ICD-10-CM | POA: Diagnosis not present

## 2022-08-22 DIAGNOSIS — S42211D Unspecified displaced fracture of surgical neck of right humerus, subsequent encounter for fracture with routine healing: Secondary | ICD-10-CM | POA: Diagnosis not present

## 2022-08-22 DIAGNOSIS — M25511 Pain in right shoulder: Secondary | ICD-10-CM | POA: Diagnosis not present

## 2022-08-22 DIAGNOSIS — I4891 Unspecified atrial fibrillation: Secondary | ICD-10-CM | POA: Diagnosis not present

## 2022-08-22 DIAGNOSIS — R2689 Other abnormalities of gait and mobility: Secondary | ICD-10-CM | POA: Diagnosis not present

## 2022-08-22 DIAGNOSIS — I25118 Atherosclerotic heart disease of native coronary artery with other forms of angina pectoris: Secondary | ICD-10-CM | POA: Diagnosis not present

## 2022-08-22 DIAGNOSIS — I5032 Chronic diastolic (congestive) heart failure: Secondary | ICD-10-CM | POA: Diagnosis not present

## 2022-08-22 DIAGNOSIS — E7800 Pure hypercholesterolemia, unspecified: Secondary | ICD-10-CM | POA: Diagnosis not present

## 2022-08-22 DIAGNOSIS — I4892 Unspecified atrial flutter: Secondary | ICD-10-CM | POA: Diagnosis not present

## 2022-08-22 DIAGNOSIS — R2681 Unsteadiness on feet: Secondary | ICD-10-CM | POA: Diagnosis not present

## 2022-08-23 DIAGNOSIS — B351 Tinea unguium: Secondary | ICD-10-CM | POA: Diagnosis not present

## 2022-08-23 DIAGNOSIS — E1159 Type 2 diabetes mellitus with other circulatory complications: Secondary | ICD-10-CM | POA: Diagnosis not present

## 2022-08-24 ENCOUNTER — Other Ambulatory Visit: Payer: Medicare PPO

## 2022-08-24 DIAGNOSIS — E039 Hypothyroidism, unspecified: Secondary | ICD-10-CM | POA: Diagnosis not present

## 2022-08-25 LAB — T4, FREE: Free T4: 1.61 ng/dL (ref 0.82–1.77)

## 2022-08-25 LAB — TSH: TSH: 3.7 u[IU]/mL (ref 0.450–4.500)

## 2022-08-27 DIAGNOSIS — M25511 Pain in right shoulder: Secondary | ICD-10-CM | POA: Diagnosis not present

## 2022-08-27 DIAGNOSIS — S42211D Unspecified displaced fracture of surgical neck of right humerus, subsequent encounter for fracture with routine healing: Secondary | ICD-10-CM | POA: Diagnosis not present

## 2022-08-27 DIAGNOSIS — Z9181 History of falling: Secondary | ICD-10-CM | POA: Diagnosis not present

## 2022-08-27 DIAGNOSIS — R2681 Unsteadiness on feet: Secondary | ICD-10-CM | POA: Diagnosis not present

## 2022-08-27 DIAGNOSIS — R2689 Other abnormalities of gait and mobility: Secondary | ICD-10-CM | POA: Diagnosis not present

## 2022-08-27 DIAGNOSIS — M6281 Muscle weakness (generalized): Secondary | ICD-10-CM | POA: Diagnosis not present

## 2022-08-28 ENCOUNTER — Other Ambulatory Visit: Payer: Self-pay | Admitting: Nurse Practitioner

## 2022-08-28 DIAGNOSIS — J069 Acute upper respiratory infection, unspecified: Secondary | ICD-10-CM

## 2022-08-28 DIAGNOSIS — R309 Painful micturition, unspecified: Secondary | ICD-10-CM

## 2022-08-28 NOTE — Progress Notes (Signed)
Please let patient know that her lab work came back in normal range.  We will continue with this dose of levothyroxine.  Follow up as discussed.

## 2022-08-29 DIAGNOSIS — M25511 Pain in right shoulder: Secondary | ICD-10-CM | POA: Diagnosis not present

## 2022-08-29 DIAGNOSIS — Z9181 History of falling: Secondary | ICD-10-CM | POA: Diagnosis not present

## 2022-08-29 DIAGNOSIS — R2689 Other abnormalities of gait and mobility: Secondary | ICD-10-CM | POA: Diagnosis not present

## 2022-08-29 DIAGNOSIS — M6281 Muscle weakness (generalized): Secondary | ICD-10-CM | POA: Diagnosis not present

## 2022-08-29 DIAGNOSIS — R2681 Unsteadiness on feet: Secondary | ICD-10-CM | POA: Diagnosis not present

## 2022-08-29 DIAGNOSIS — S42211D Unspecified displaced fracture of surgical neck of right humerus, subsequent encounter for fracture with routine healing: Secondary | ICD-10-CM | POA: Diagnosis not present

## 2022-08-29 NOTE — Telephone Encounter (Signed)
Requested Prescriptions  Pending Prescriptions Disp Refills  . albuterol (VENTOLIN HFA) 108 (90 Base) MCG/ACT inhaler [Pharmacy Med Name: ALBUTEROL SULFATE HFA 108 (90 BASE)] 8.5 g 2    Sig: INHALE 2 PUFFS INTO THE LUNGS EVERY 6 HOURS AS NEEDED FOR WHEEZING OR SHORTNESS OF BREATH     Pulmonology:  Beta Agonists 2 Passed - 08/28/2022 10:11 AM      Passed - Last BP in normal range    BP Readings from Last 1 Encounters:  08/21/22 (!) 95/53         Passed - Last Heart Rate in normal range    Pulse Readings from Last 1 Encounters:  08/21/22 64         Passed - Valid encounter within last 12 months    Recent Outpatient Visits          1 month ago Hypertension associated with diabetes ([place])   [facility] Jon Billings, NP   4 months ago Chronic diastolic CHF (congestive heart failure) ([place])   [facility] Jon Billings, NP   4 months ago Chronic diastolic CHF (congestive heart failure) ([place])   [place], Jolene T, NP   5 months ago Upper respiratory tract infection, unspecified type   [facility] Vigg, Avanti, MD   6 months ago Benign essential hypertension   [facility] Jon Billings, NP      Future Appointments            In 1 month Jon Billings, NP [facility], [place]

## 2022-09-03 DIAGNOSIS — R2681 Unsteadiness on feet: Secondary | ICD-10-CM | POA: Diagnosis not present

## 2022-09-03 DIAGNOSIS — S42211D Unspecified displaced fracture of surgical neck of right humerus, subsequent encounter for fracture with routine healing: Secondary | ICD-10-CM | POA: Diagnosis not present

## 2022-09-03 DIAGNOSIS — Z9181 History of falling: Secondary | ICD-10-CM | POA: Diagnosis not present

## 2022-09-03 DIAGNOSIS — M6281 Muscle weakness (generalized): Secondary | ICD-10-CM | POA: Diagnosis not present

## 2022-09-03 DIAGNOSIS — M25511 Pain in right shoulder: Secondary | ICD-10-CM | POA: Diagnosis not present

## 2022-09-03 DIAGNOSIS — R2689 Other abnormalities of gait and mobility: Secondary | ICD-10-CM | POA: Diagnosis not present

## 2022-09-04 ENCOUNTER — Encounter: Payer: Medicare PPO | Attending: Cardiology | Admitting: *Deleted

## 2022-09-04 DIAGNOSIS — I214 Non-ST elevation (NSTEMI) myocardial infarction: Secondary | ICD-10-CM

## 2022-09-04 NOTE — Progress Notes (Signed)
Initial telephone orientation completed. Diagnosis can be found in [facility] 8/30. EP orientation scheduled for Monday 9/26 at 9am.

## 2022-09-06 DIAGNOSIS — M25511 Pain in right shoulder: Secondary | ICD-10-CM | POA: Diagnosis not present

## 2022-09-06 DIAGNOSIS — R2689 Other abnormalities of gait and mobility: Secondary | ICD-10-CM | POA: Diagnosis not present

## 2022-09-06 DIAGNOSIS — R2681 Unsteadiness on feet: Secondary | ICD-10-CM | POA: Diagnosis not present

## 2022-09-06 DIAGNOSIS — M6281 Muscle weakness (generalized): Secondary | ICD-10-CM | POA: Diagnosis not present

## 2022-09-06 DIAGNOSIS — Z9181 History of falling: Secondary | ICD-10-CM | POA: Diagnosis not present

## 2022-09-06 DIAGNOSIS — S42211D Unspecified displaced fracture of surgical neck of right humerus, subsequent encounter for fracture with routine healing: Secondary | ICD-10-CM | POA: Diagnosis not present

## 2022-09-07 ENCOUNTER — Ambulatory Visit: Payer: Self-pay | Admitting: *Deleted

## 2022-09-07 DIAGNOSIS — E119 Type 2 diabetes mellitus without complications: Secondary | ICD-10-CM | POA: Diagnosis not present

## 2022-09-07 DIAGNOSIS — M5416 Radiculopathy, lumbar region: Secondary | ICD-10-CM | POA: Diagnosis not present

## 2022-09-07 NOTE — Telephone Encounter (Signed)
  Chief Complaint: Wheezing and productive cough.   Mentioned she has heart failure. Symptoms: Coughing up clear sputum and having intermittent wheezing.   Has an inhaler but doesn't know if she should use it regularly or as needed. Frequency: For the last 2 weeks.   Pertinent Negatives: Patient denies Fever or constant shortness of breath. Disposition: '[]'$ ED /'[]'$ Urgent Care (no appt availability in office) / '[x]'$ Appointment(In office/virtual)/ '[]'$  [place] [facility]/ '[]'$ Home Care/ '[]'$ Refused Recommended Disposition /'[]'$ [place] [facility]/ '[]'$  Follow-up with PCP Additional Notes: Had appts available today however she could not come in today and requested to be scheduled on Monday.   Scheduled for 09/10/2022 at 11:00 with Marnee Guarneri, NP  I sent this information to Jon Billings, NP letting her know situation with pt in case she wanted to contact her prior to being seen by Jolene on Monday.

## 2022-09-07 NOTE — Progress Notes (Unsigned)
   There were no vitals taken for this visit.   Subjective:    Patient ID: Jammie Troup, female    DOB: 09-30-1933, 88 y.o.   MRN: [id number]  HPI: Diania Co is a 88 y.o. female  No chief complaint on file.  UPPER RESPIRATORY TRACT INFECTION Worst symptom: Fever: {Blank single:19197::"yes","no"} Cough: {Blank single:19197::"yes","no"} Shortness of breath: {Blank single:19197::"yes","no"} Wheezing: {Blank single:19197::"yes","no"} Chest pain: {Blank single:19197::"yes","no","yes, with cough"} Chest tightness: {Blank single:19197::"yes","no"} Chest congestion: {Blank single:19197::"yes","no"} Nasal congestion: {Blank single:19197::"yes","no"} Runny nose: {Blank single:19197::"yes","no"} Post nasal drip: {Blank single:19197::"yes","no"} Sneezing: {Blank single:19197::"yes","no"} Sore throat: {Blank single:19197::"yes","no"} Swollen glands: {Blank single:19197::"yes","no"} Sinus pressure: {Blank single:19197::"yes","no"} Headache: {Blank single:19197::"yes","no"} Face pain: {Blank single:19197::"yes","no"} Toothache: {Blank single:19197::"yes","no"} Ear pain: {Blank single:19197::"yes","no"} {Blank single:19197::""right","left", "bilateral"} Ear pressure: {Blank single:19197::"yes","no"} {Blank single:19197::""right","left", "bilateral"} Eyes red/itching:{Blank single:19197::"yes","no"} Eye drainage/crusting: {Blank single:19197::"yes","no"}  Vomiting: {Blank single:19197::"yes","no"} Rash: {Blank single:19197::"yes","no"} Fatigue: {Blank single:19197::"yes","no"} Sick contacts: {Blank single:19197::"yes","no"} Strep contacts: {Blank single:19197::"yes","no"}  Context: {Blank multiple:19196::"better","worse","stable","fluctuating"} Recurrent sinusitis: {Blank single:19197::"yes","no"} Relief with OTC cold/cough medications: {Blank single:19197::"yes","no"}  Treatments attempted: {Blank multiple:19196::"none","cold/sinus","mucinex","anti-histamine","pseudoephedrine","cough  syrup","antibiotics"}   Relevant past medical, surgical, family and social history reviewed and updated as indicated. Interim medical history since our last visit reviewed. Allergies and medications reviewed and updated.  Review of Systems  Per HPI unless specifically indicated above     Objective:    There were no vitals taken for this visit.  Wt Readings from Last 3 Encounters:  08/21/22 173 lb 2 oz (78.5 kg)  07/12/22 169 lb 12.8 oz (77 kg)  05/15/22 177 lb (80.3 kg)    Physical Exam  Results for orders placed or performed in visit on 08/24/22  T4, free  Result Value Ref Range   Free T4 1.61 0.82 - 1.77 ng/dL  TSH  Result Value Ref Range   TSH 3.700 0.450 - 4.500 uIU/mL      Assessment & Plan:   Problem List Items Addressed This Visit   None    Follow up plan: No follow-ups on file.

## 2022-09-07 NOTE — Telephone Encounter (Signed)
Appt rescheduled

## 2022-09-07 NOTE — Telephone Encounter (Signed)
Reason for Disposition  Continuous (nonstop) coughing interferes with work, school, or sleeping  Answer Assessment - Initial Assessment Questions 1. RESPIRATORY STATUS: "Describe your breathing?" (e.g., wheezing, shortness of breath, unable to speak, severe coughing)     A month ago I had a URI.   I was prescribed an inhaler.    Now I'm coughing up during night and wheezing.   I don't know how to use this inhaler.    Do I use it regularly or as needed. 2. ONSET: "When did this breathing problem begin?"      The wheezing and coughing for the last couple of weeks.   It's intermittent 3. PATTERN "Does the difficult breathing come and go, or has it been constant since it started?"      I have heart failure so I'm short of breath anyway.     This coughing throughout the day with activities. 4. SEVERITY: "How bad is your breathing?" (e.g., mild, moderate, severe)    - MILD: No SOB at rest, mild SOB with walking, speaks normally in sentences, can lie down, no retractions, pulse < 100.    - MODERATE: SOB at rest, SOB with minimal exertion and prefers to sit, cannot lie down flat, speaks in phrases, mild retractions, audible wheezing, pulse 100-120.    - SEVERE: Very SOB at rest, speaks in single words, struggling to breathe, sitting hunched forward, retractions, pulse > 120      Mild  5. RECURRENT SYMPTOM: "Have you had difficulty breathing before?" If Yes, ask: "When was the last time?" and "What happened that time?"      Yes    I've had a heart attack 6. CARDIAC HISTORY: "Do you have any history of heart disease?" (e.g., heart attack, angina, bypass surgery, angioplasty)      Heart attack 7. LUNG HISTORY: "Do you have any history of lung disease?"  (e.g., pulmonary embolus, asthma, emphysema)     Yes 8. CAUSE: "What do you think is causing the breathing problem?"      Not asked 9. OTHER SYMPTOMS: "Do you have any other symptoms? (e.g., dizziness, runny nose, cough, chest pain, fever)     Wheezing  at night.    Coughing up mucus clear. 10. O2 SATURATION MONITOR:  "Do you use an oxygen saturation monitor (pulse oximeter) at home?" If Yes, ask: "What is your reading (oxygen level) today?" "What is your usual oxygen saturation reading?" (e.g., 95%)       N/A 11. PREGNANCY: "Is there any chance you are pregnant?" "When was your last menstrual period?"       NA 12. TRAVEL: "Have you traveled out of the country in the last month?" (e.g., travel history, exposures)       Not asked  Protocols used: Breathing Difficulty-A-AH

## 2022-09-10 ENCOUNTER — Encounter: Payer: Self-pay | Admitting: Nurse Practitioner

## 2022-09-10 ENCOUNTER — Ambulatory Visit: Payer: Medicare PPO | Admitting: Nurse Practitioner

## 2022-09-10 VITALS — BP 129/80 | HR 56 | Temp 97.6°F | Wt 170.1 lb

## 2022-09-10 DIAGNOSIS — K219 Gastro-esophageal reflux disease without esophagitis: Secondary | ICD-10-CM

## 2022-09-10 DIAGNOSIS — J069 Acute upper respiratory infection, unspecified: Secondary | ICD-10-CM

## 2022-09-10 MED ORDER — OMEPRAZOLE 20 MG PO CPDR: 20.0000 mg | DELAYED_RELEASE_CAPSULE | Freq: Two times a day (BID) | ORAL | 1 refills | Status: DC

## 2022-09-10 MED ORDER — AMOXICILLIN 500 MG PO CAPS: 500.0000 mg | ORAL_CAPSULE | Freq: Two times a day (BID) | ORAL | 0 refills | Status: AC

## 2022-09-10 NOTE — Assessment & Plan Note (Signed)
Chronic. Not well controlled.  Suspect some of the night time waking and coughing are related to GERD. Will change Omeprazole from '20mg'$  daily to '20mg'$  BID.  Follow up in 1 month for reevaluation.  Call sooner if concerns arise.

## 2022-09-14 ENCOUNTER — Ambulatory Visit: Payer: Self-pay

## 2022-09-14 DIAGNOSIS — D2261 Melanocytic nevi of right upper limb, including shoulder: Secondary | ICD-10-CM | POA: Diagnosis not present

## 2022-09-14 DIAGNOSIS — L218 Other seborrheic dermatitis: Secondary | ICD-10-CM | POA: Diagnosis not present

## 2022-09-14 DIAGNOSIS — L814 Other melanin hyperpigmentation: Secondary | ICD-10-CM | POA: Diagnosis not present

## 2022-09-14 DIAGNOSIS — D2262 Melanocytic nevi of left upper limb, including shoulder: Secondary | ICD-10-CM | POA: Diagnosis not present

## 2022-09-14 DIAGNOSIS — D2272 Melanocytic nevi of left lower limb, including hip: Secondary | ICD-10-CM | POA: Diagnosis not present

## 2022-09-14 DIAGNOSIS — Z85828 Personal history of other malignant neoplasm of skin: Secondary | ICD-10-CM | POA: Diagnosis not present

## 2022-09-14 NOTE — Telephone Encounter (Signed)
  Chief Complaint: GERD  Symptoms: Burping all night Frequency: ongoing Pertinent Negatives: Patient denies  Disposition: '[]'$ ED /'[]'$ Urgent Care (no appt availability in office) / '[]'$ Appointment(In office/virtual)/ '[]'$  [place] [facility]/ '[]'$ Home Care/ '[]'$ Refused Recommended Disposition /'[]'$  Mobile Bus/ '[x]'$  Follow-up with PCP Additional Notes: Pt has increased Metrazol as suggested with no relief. Pt would like a different Gerd medication called in. PT would prefer not ot come in for OV.  Please advise.  Summary: indigestion   Pt was in the office Monday for indigestion and belching.  Jon Billings told her to take 2 omeprazole instead of one.  She has been doing that for a couple of days and is no better.  Please advise   CB@  [phone]      Reason for Disposition  Prescription request for new medicine (not a refill)  Answer Assessment - Initial Assessment Questions 1. DRUG NAME: "What medicine do you need to have refilled?"     Gerd medication 2. REFILLS REMAINING: "How many refills are remaining?" (Note: The label on the medicine or pill bottle will show how many refills are remaining. If there are no refills remaining, then a renewal may be needed.)      3. EXPIRATION DATE: "What is the expiration date?" (Note: The label states when the prescription will expire, and thus can no longer be refilled.)      4. PRESCRIBING HCP: "Who prescribed it?" Reason: If prescribed by specialist, call should be referred to that group.     Jon Billings 5. SYMPTOMS: "Do you have any symptoms?"     Burping 6. PREGNANCY: "Is there any chance that you are pregnant?" "When was your last menstrual period?"     na  Protocols used: Medication Refill and Renewal Call-A-AH

## 2022-09-17 ENCOUNTER — Ambulatory Visit: Payer: Medicare PPO

## 2022-09-17 NOTE — Telephone Encounter (Signed)
Patient states she is not interested in that right now and hung up the phone.

## 2022-09-17 NOTE — Telephone Encounter (Signed)
I'm not able to change the medication due to patient's allergy to Pantoprazole.  I can refer her to GI.

## 2022-09-18 ENCOUNTER — Other Ambulatory Visit: Payer: Self-pay | Admitting: Nurse Practitioner

## 2022-09-18 DIAGNOSIS — I739 Peripheral vascular disease, unspecified: Secondary | ICD-10-CM | POA: Diagnosis not present

## 2022-09-18 DIAGNOSIS — I5032 Chronic diastolic (congestive) heart failure: Secondary | ICD-10-CM | POA: Diagnosis not present

## 2022-09-18 DIAGNOSIS — E1169 Type 2 diabetes mellitus with other specified complication: Secondary | ICD-10-CM | POA: Diagnosis not present

## 2022-09-18 DIAGNOSIS — I25118 Atherosclerotic heart disease of native coronary artery with other forms of angina pectoris: Secondary | ICD-10-CM | POA: Diagnosis not present

## 2022-09-18 DIAGNOSIS — I4891 Unspecified atrial fibrillation: Secondary | ICD-10-CM | POA: Diagnosis not present

## 2022-09-18 DIAGNOSIS — I671 Cerebral aneurysm, nonruptured: Secondary | ICD-10-CM | POA: Diagnosis not present

## 2022-09-18 DIAGNOSIS — E7800 Pure hypercholesterolemia, unspecified: Secondary | ICD-10-CM | POA: Diagnosis not present

## 2022-09-18 DIAGNOSIS — I480 Paroxysmal atrial fibrillation: Secondary | ICD-10-CM | POA: Diagnosis not present

## 2022-09-18 DIAGNOSIS — E1140 Type 2 diabetes mellitus with diabetic neuropathy, unspecified: Secondary | ICD-10-CM | POA: Diagnosis not present

## 2022-09-18 NOTE — Telephone Encounter (Signed)
Requested medication (s) are due for refill today: yes  Requested medication (s) are on the active medication list: yes  Last refill:    Future visit scheduled: yes  Notes to clinic:  all meds are historical. Pharmacy requesting asap d/t bubble pack pt.      Requested Prescriptions  Pending Prescriptions Disp Refills   glipiZIDE (GLUCOTROL XL) 2.5 MG 24 hr tablet [Pharmacy Med Name: GLIPIZIDE ER 2.5 MG TAB] 30 tablet     Sig: TAKE ONE TABLET BY MOUTH ONCE DAILY     Endocrinology:  Diabetes - Sulfonylureas Passed - 09/18/2022  4:04 PM      Passed - HBA1C is between 0 and 7.9 and within 180 days    Hgb A1c MFr Bld  Date Value Ref Range Status  07/12/2022 7.1 (H) 4.8 - 5.6 % Final    Comment:             Prediabetes: 5.7 - 6.4          Diabetes: >6.4          Glycemic control for adults with diabetes: <7.0          Passed - Cr in normal range and within 360 days    Creatinine, Ser  Date Value Ref Range Status  07/12/2022 0.75 0.57 - 1.00 mg/dL Final         Passed - Valid encounter within last 6 months    Recent Outpatient Visits           1 week ago Viral upper respiratory tract infection   [facility] Jon Billings, NP   2 months ago Hypertension associated with diabetes ([place])   [facility] Jon Billings, NP   5 months ago Chronic diastolic CHF (congestive heart failure) ([place])   [facility] Jon Billings, NP   5 months ago Chronic diastolic CHF (congestive heart failure) ([place])   [facility], Jolene T, NP   6 months ago Upper respiratory tract infection, unspecified type   [place] Vigg, Avanti, MD       Future Appointments             In 3 weeks Jon Billings, NP [place]             donepezil (ARICEPT) 10 MG tablet [Pharmacy Med Name: DONEPEZIL HCL 10 MG TAB] 90 tablet     Sig: TAKE ONE TABLET (10 MG) BY MOUTH AT BEDTIME     Neurology:   Alzheimer's Agents Passed - 09/18/2022  4:04 PM      Passed - Valid encounter within last 6 months    Recent Outpatient Visits           1 week ago Viral upper respiratory tract infection   [facility] Jon Billings, NP   2 months ago Hypertension associated with diabetes [facility])   [facility] Jon Billings, NP   5 months ago Chronic diastolic CHF (congestive heart failure) ([place])   [facility] Jon Billings, NP   5 months ago Chronic diastolic CHF (congestive heart failure) ([place])   [place], Jolene T, NP   6 months ago Upper respiratory tract infection, unspecified type   [place] Vigg, Avanti, MD       Future Appointments             In 3 weeks Jon Billings, NP [facility] [place]  venlafaxine XR (EFFEXOR-XR) 75 MG 24 hr capsule [Pharmacy Med Name: VENLAFAXINE HCL ER 75 MG CAP] 90 capsule     Sig: TAKE 1 CAPSULE BY MOUTH ONCE DAILY.     Psychiatry: Antidepressants - SNRI - desvenlafaxine & venlafaxine Failed - 09/18/2022  4:04 PM      Failed - Lipid Panel in normal range within the last 12 months    Cholesterol, Total  Date Value Ref Range Status  07/12/2022 156 100 - 199 mg/dL Final   LDL Chol Calc (NIH)  Date Value Ref Range Status  07/12/2022 78 0 - 99 mg/dL Final   HDL  Date Value Ref Range Status  07/12/2022 37 (L) >39 mg/dL Final   Triglycerides  Date Value Ref Range Status  07/12/2022 251 (H) 0 - 149 mg/dL Final         Passed - Cr in normal range and within 360 days    Creatinine, Ser  Date Value Ref Range Status  07/12/2022 0.75 0.57 - 1.00 mg/dL Final         Passed - Completed PHQ-2 or PHQ-9 in the last 360 days      Passed - Last BP in normal range    BP Readings from Last 1 Encounters:  09/10/22 129/80         Passed - Valid encounter within last 6 months    Recent Outpatient Visits           1 week ago Viral upper  respiratory tract infection   [facility] Jon Billings, NP   2 months ago Hypertension associated with diabetes [facility])   [facility] Jon Billings, NP   5 months ago Chronic diastolic CHF (congestive heart failure) ([place])   [facility] Jon Billings, NP   5 months ago Chronic diastolic CHF (congestive heart failure) ([place])   [place], Jolene T, NP   6 months ago Upper respiratory tract infection, unspecified type   [facility] Vigg, Avanti, MD       Future Appointments             In 3 weeks Jon Billings, NP [facility]

## 2022-09-19 DIAGNOSIS — S42201A Unspecified fracture of upper end of right humerus, initial encounter for closed fracture: Secondary | ICD-10-CM | POA: Diagnosis not present

## 2022-09-19 DIAGNOSIS — S52515A Nondisplaced fracture of left radial styloid process, initial encounter for closed fracture: Secondary | ICD-10-CM | POA: Diagnosis not present

## 2022-09-24 ENCOUNTER — Ambulatory Visit: Payer: Medicare PPO

## 2022-09-24 ENCOUNTER — Other Ambulatory Visit: Payer: Self-pay | Admitting: Nurse Practitioner

## 2022-09-24 DIAGNOSIS — E119 Type 2 diabetes mellitus without complications: Secondary | ICD-10-CM | POA: Diagnosis not present

## 2022-09-24 DIAGNOSIS — H524 Presbyopia: Secondary | ICD-10-CM | POA: Diagnosis not present

## 2022-09-24 LAB — HM DIABETES EYE EXAM

## 2022-09-25 ENCOUNTER — Ambulatory Visit (INDEPENDENT_AMBULATORY_CARE_PROVIDER_SITE_OTHER): Payer: Medicare PPO | Admitting: Neurology

## 2022-09-25 ENCOUNTER — Encounter: Payer: Self-pay | Admitting: Neurology

## 2022-09-25 VITALS — BP 96/59 | HR 95 | Ht 66.0 in | Wt 170.0 lb

## 2022-09-25 DIAGNOSIS — G4733 Obstructive sleep apnea (adult) (pediatric): Secondary | ICD-10-CM

## 2022-09-25 DIAGNOSIS — R351 Nocturia: Secondary | ICD-10-CM

## 2022-09-25 DIAGNOSIS — Z789 Other specified health status: Secondary | ICD-10-CM | POA: Diagnosis not present

## 2022-09-25 DIAGNOSIS — R519 Headache, unspecified: Secondary | ICD-10-CM | POA: Diagnosis not present

## 2022-09-25 DIAGNOSIS — I482 Chronic atrial fibrillation, unspecified: Secondary | ICD-10-CM

## 2022-09-25 NOTE — Progress Notes (Signed)
Subjective:    Patient ID: Alison Richardson is a 88 y.o. female.  HPI    Star Age, MD, PhD [facility] [address]O. [place], [place] [address]  Dear Henrine Screws,   I saw your patient, Alison Richardson, upon your kind request in my sleep clinic today for initial consultation of her sleep disorder, in particular, evaluation of her prior diagnosis of obstructive sleep apnea.  The patient is unaccompanied today.  She missed an appointment on 06/07/22. As you know, [name] is an 88 year old right-handed woman with an underlying complex medical history of coronary artery disease with status post non-STEMI in October 2022, atrial flutter/A-fib, on Eliquis, congestive heart failure, pulmonary hypertension, diabetes, diverticulosis, reflux disease, hypertension, hyperlipidemia, hypothyroidism, seasonal allergies, peripheral vascular disease, cerebral aneurym, (seen by [facility]), memory loss (late onset Alzheimer's disease by chart review), and overweight state, who was previously diagnosed with obstructive sleep apnea and placed on PAP therapy.  She is no longer using machine due to discomfort.  I reviewed your office note from 04/03/2022.  She has had trouble going to sleep and maintaining sleep.  She has previously tried melatonin and trazodone without success.  Trial of mirtazapine was discussed at the time.  Her Epworth sleepiness score is 14/24, fatigue severity score is 48 out of 63.  She was diagnosed with obstructive sleep apnea with a home sleep test in 2020, AHI was around 28/h, O2 nadir 88%, testing was done through her primary care and she was placed on PAP therapy but could not tolerate it and has not used her machine in about 2 years or at least 1 year.  We tried to get a download from her DME company, [employer] but they did not have a download or remote access either.  She was recently hospitalized in late May 2023 after a fall, she sustained a right humerus  fracture.  She was treated conservatively. She reports difficulty with the mask.  She tried different interfaces as I understand.  She would be willing to get back on positive airway pressure treatment.  She would be willing to come in for a sleep study in the sleep lab.  She lives alone, she is in independent living.  Her son has sleep apnea.  She has nocturia about twice per average night and has had occasional morning headaches.  She drinks caffeine in the form of coffee, about 1 or 2 cups/day, 1 cup of iced tea per day.  She is a non-smoker.  Bedtime is around 11 PM and rise time around 8.  She has a prescription for oxycodone but has not taken it recently.  She had a tonsillectomy as a child.   Her Past Medical History Is Significant For: Past Medical History:  Diagnosis Date   Arrhythmia    Atrial flutter ([address]) 01/2018   new onset    Basal cell carcinoma of back    Basal cell carcinoma of lip    Cerebral aneurysm    followed by [facility]   CHF (congestive heart failure) (HCC)    DDD (degenerative disc disease), lumbar    superior plate depression, L3 08/18/2014   Diabetes mellitus type II, controlled ([place])    Diverticulosis    Dysrhythmia    Paroxysmal Supraventricular Tachycardia   Dysthymia    depression   GERD (gastroesophageal reflux disease)    History of meniscal tear    Hyperlipidemia    Hypertension    Hypothyroidism    Late  onset Alzheimer's disease with behavioral disturbance ([place])    Mild pulmonary hypertension (HCC)    Overactive bladder    Peripheral vascular disease (HCC)    Seasonal allergic rhinitis    Sleep apnea     Her Past Surgical History Is Significant For: Past Surgical History:  Procedure Laterality Date   APPENDECTOMY  1946   BASAL CELL CARCINOMA EXCISION     2006 and 2009 removed from back and lip   CARDIOVASCULAR STRESS TEST  2015   nuclear cardiac stress test negative for ischemia - Dr. Satira Sark   CATARACT EXTRACTION, BILATERAL  2006    COLONOSCOPY  2014   COLONOSCOPY N/A 08/19/2020   Procedure: COLONOSCOPY;  Surgeon: Lesly Rubenstein, MD;  Location: [facility] ENDOSCOPY;  Service: Endoscopy;  Laterality: N/A;   ECTOPIC PREGNANCY SURGERY  1957   ESOPHAGOGASTRODUODENOSCOPY N/A 08/19/2020   Procedure: ESOPHAGOGASTRODUODENOSCOPY (EGD);  Surgeon: Lesly Rubenstein, MD;  Location[facility] ENDOSCOPY;  Service: Endoscopy;  Laterality: N/A;   INJECTION KNEE Left 05/2015   LEFT HEART CATH AND CORONARY ANGIOGRAPHY N/A 10/12/2021   Procedure: LEFT HEART CATH AND CORONARY ANGIOGRAPHY;  Surgeon: Corey Skains, MD;  Location: [facility];  Service: Cardiovascular;  Laterality: N/A;   REPLACEMENT TOTAL KNEE Left 09/06/2016   Dr. Barnet Pall   TONSILLECTOMY AND ADENOIDECTOMY  1953   TOTAL ABDOMINAL HYSTERECTOMY  1986   [facility]    Her Family History Is Significant For: Family History  Problem Relation Age of Onset   Hypertension Mother    Heart disease Father    CAD Father    Heart disease Sister    Diabetes Sister    Diabetes Paternal Uncle    Sleep apnea Son     Her Social History Is Significant For: Social History   Socioeconomic History   Marital status: Widowed    Spouse name: Not on file   Number of children: 5   Years of education: Not on file   Highest education level: Not on file  Occupational History   Not on file  Tobacco Use   Smoking status: Never   Smokeless tobacco: Never  Vaping Use   Vaping Use: Never used  Substance and Sexual Activity   Alcohol use: Yes    Alcohol/week: 3.0 standard drinks of alcohol    Types: 3 Shots of liquor per week    Comment: 3 x week    Drug use: Never   Sexual activity: Not Currently  Other Topics Concern   Not on file  Social History Narrative   Not on file   Social Determinants of Health   Financial Resource Strain: Low Risk  (03/20/2022)   Overall Financial Resource Strain (CARDIA)    Difficulty of Paying Living Expenses: Not hard at all  Food Insecurity: No  Food Insecurity (03/20/2022)   Hunger Vital Sign    Worried About Running Out of Food in the Last Year: Never true    Ran Out of Food in the Last Year: Never true  Transportation Needs: No Transportation Needs (03/20/2022)   PRAPARE - Hydrologist (Medical): No    Lack of Transportation (Non-Medical): No  Physical Activity: Insufficiently Active (03/20/2022)   Exercise Vital Sign    Days of Exercise per Week: 2 days    Minutes of Exercise per Session: 30 min  Stress: No Stress Concern Present (03/20/2022)   [place]    Feeling of Stress :  Not at all  Social Connections: Moderately Integrated (03/20/2022)   Social Connection and Isolation Panel [NHANES]    Frequency of Communication with Friends and Family: More than three times a week    Frequency of Social Gatherings with Friends and Family: More than three times a week    Attends Religious Services: More than 4 times per year    Active Member of [employer] or Organizations: Yes    Attends Archivist Meetings: More than 4 times per year    Marital Status: Widowed    Her Allergies Are:  Allergies  Allergen Reactions   Pantoprazole Nausea And Vomiting   Metformin And Related Diarrhea  :   Her Current Medications Are:  Outpatient Encounter Medications as of 09/25/2022  Medication Sig   albuterol (VENTOLIN HFA) 108 (90 Base) MCG/ACT inhaler INHALE 2 PUFFS INTO THE LUNGS EVERY 6 HOURS AS NEEDED FOR WHEEZING OR SHORTNESS OF BREATH   amiodarone (PACERONE) 200 MG tablet Take 200 mg by mouth daily.    clopidogrel (PLAVIX) 75 MG tablet Take 75 mg by mouth daily.   donepezil (ARICEPT) 10 MG tablet TAKE ONE TABLET (10 MG) BY MOUTH AT BEDTIME   ELIQUIS 5 MG TABS tablet Take 5 mg by mouth 2 (two) times daily.    famotidine (PEPCID) 20 MG tablet Take 1 tablet (20 mg total) by mouth daily.   gabapentin (NEURONTIN) 100 MG capsule Take 100 mg by  mouth 2 (two) times daily.   glipiZIDE (GLUCOTROL XL) 2.5 MG 24 hr tablet TAKE ONE TABLET BY MOUTH ONCE DAILY   isosorbide mononitrate (IMDUR) 30 MG 24 hr tablet Take 30 mg by mouth daily.   levothyroxine (SYNTHROID) 88 MCG tablet Take 1 tablet (88 mcg total) by mouth daily.   metoprolol succinate (TOPROL-XL) 100 MG 24 hr tablet TAKE ONE TABLET (100 MG) BY MOUTH EVERY DAY (TAKE WITH OR IMMEDIATELY AFTER A MEAL)   montelukast (SINGULAIR) 10 MG tablet Take 1 tablet (10 mg total) by mouth at bedtime.   nitroGLYCERIN (NITROSTAT) 0.4 MG SL tablet Place 0.4 mg under the tongue every 5 (five) minutes as needed for chest pain.   omeprazole (PRILOSEC) 20 MG capsule Take 1 capsule (20 mg total) by mouth 2 (two) times daily before a meal.   oxyCODONE (OXY IR/ROXICODONE) 5 MG immediate release tablet Take 1 tablet (5 mg total) by mouth every 4 (four) hours as needed for severe pain or breakthrough pain. (Patient taking differently: Take 5 mg by mouth every 4 (four) hours as needed for severe pain or breakthrough pain. Taking for shoulder pain on physical therapy days)   pravastatin (PRAVACHOL) 20 MG tablet Take 20 mg by mouth at bedtime.   torsemide (DEMADEX) 20 MG tablet Take 1 tablet (20 mg total) by mouth daily.   venlafaxine XR (EFFEXOR-XR) 75 MG 24 hr capsule TAKE 1 CAPSULE BY MOUTH ONCE DAILY.   No facility-administered encounter medications on file as of 09/25/2022.  :   Review of Systems:  Out of a complete 14 point review of systems, all are reviewed and negative with the exception of these symptoms as listed below:   Review of Systems  Neurological:        Pt here for sleep consult Pt snore,headache,fatigue,hypertension Pt states BP  elevated in am  Sleep study done in 2020 over the phone due to covid  Pt states CPAP for 2 years . Pt hasnt worn CPAP in a year  Pt states mask burned her nose  ESS:14  FSS:48    Objective:  Neurological Exam  Physical Exam Physical Examination:    Vitals:   09/25/22 1359  BP: (!) 96/59  Pulse: 95    General Examination: The patient is a very pleasant 88 y.o. female in no acute distress. She appears well-developed and well-nourished and well groomed.   HEENT: Normocephalic, atraumatic, pupils are equal, round and reactive to light, extraocular tracking is good without limitation to gaze excursion or nystagmus noted. Hearing is grossly intact. Face is symmetric with normal facial animation. Speech is clear with no dysarthria noted. There is no hypophonia. There is no lip, neck/head, jaw or voice tremor. Neck is supple with full range of passive and active motion. There are no carotid bruits on auscultation. Oropharynx exam reveals: moderate mouth dryness, adequate dental hygiene and mild airway crowding, due to redundant soft palate and small airway entry.  Mallampati class II.  Neck circumference of 16 inches.  Tonsils absent.  Tongue protrudes centrally and palate elevates symmetrically.  Chest: Clear to auscultation without wheezing, rhonchi or crackles noted.  Heart: S1+S2+0, regular and normal without murmurs, rubs or gallops noted.   Abdomen: Soft, non-tender and non-distended.  Extremities: There is no pitting edema in the distal lower extremities bilaterally.   Skin: Warm and dry without trophic changes noted.   Musculoskeletal: exam reveals no obvious joint deformities.   Neurologically:  Mental status: The patient is awake, alert and oriented in all 4 spheres. Her immediate and remote memory, attention, language skills and fund of knowledge are appropriate. There is no evidence of aphasia, agnosia, apraxia or anomia. Speech is clear with normal prosody and enunciation. Thought process is linear. Mood is normal and affect is normal.  Cranial nerves II - XII are as described above under HEENT exam.  Motor exam: Normal bulk, strength and tone is noted. There is no obvious action or resting tremor.  Fine motor skills and  coordination: grossly intact.  Cerebellar testing: No dysmetria or intention tremor. There is no truncal or gait ataxia.  Sensory exam: intact to light touch in the upper and lower extremities.  Gait, station and balance: She stands without difficulty.  She walks with a three-point cane.   Assessment and Plan:  In summary, Zikeria Keough is a very pleasant 88 y.o.-year old female with an underlying complex medical history of coronary artery disease with status post non-STEMI in October 2022, atrial flutter/A-fib, on Eliquis, congestive heart failure, pulmonary hypertension, diabetes, diverticulosis, reflux disease, hypertension, hyperlipidemia, hypothyroidism, seasonal allergies, peripheral vascular disease, cerebral aneurym, (seen by [facility]), memory loss (late onset Alzheimer's disease by chart review), and overweight state, who was previously diagnosed with obstructive sleep apnea in the moderate range via home sleep testing in 2020.  She has had trouble using her CPAP or AutoPap machine.  Has not used her machine in at least 1 year.  She would be willing to get back on it.  We mutually agreed to pursue an in lab sleep study for reassessment and consider adjusting her CPAP or AutoPap accordingly.  I talked to the patient about the diagnosis of obstructive sleep apnea, its prognosis and treatment options. We talked about medical/conservative treatments, surgical interventions and non-pharmacological approaches for symptom control. I explained, in particular, the risks and ramifications of untreated moderate to severe OSA, especially with respect to developing cardiovascular disease down the road, including congestive heart failure (CHF), difficult to treat hypertension, cardiac arrhythmias (particularly A-fib), neurovascular complications including TIA, stroke and dementia. Even  type 2 diabetes has, in part, been linked to untreated OSA. Symptoms of untreated OSA may include (but may not be limited to)  daytime sleepiness, nocturia (i.e. frequent nighttime urination), memory problems, mood irritability and suboptimally controlled or worsening mood disorder such as depression and/or anxiety, lack of energy, lack of motivation, physical discomfort, as well as recurrent headaches, especially morning or nocturnal headaches. We talked about the importance of maintaining a healthy lifestyle and striving for healthy weight.  I outlined the differences between a laboratory attended sleep study which is considered more comprehensive and accurate over the option of a home sleep test (HST); the latter may lead to underestimation of sleep disordered breathing in some instances and does not help with diagnosing upper airway resistance syndrome and is not accurate enough to diagnose primary central sleep apnea typically. I also outlined possible surgical and non-surgical treatment options of OSA, including the use of a pressure airway pressure (PAP) device (ie CPAP, AutoPAP/APAP or BiPAP in certain circumstances), a custom-made dental device (aka oral appliance, which would require a referral to a specialist dentist or orthodontist typically, and is generally speaking not considered a good choice for patients with full dentures or edentulous state), upper airway surgical options, such as traditional UPPP (which is not considered a first-line treatment) or the Inspire device (hypoglossal nerve stimulator, which would involve a referral for consultation with an ENT surgeon, after careful selection, following inclusion criteria). I explained the PAP treatment option to the patient in detail, as this is generally considered first-line treatment.  The patient indicated that she would be willing to try PAP therapy again, if the need arises. I explained the importance of being compliant with PAP treatment, not only for insurance purposes but primarily to improve patient's symptoms symptoms, and for the patient's long term health  benefit, including to reduce Her cardiovascular risks longer-term.    We will pick up our discussion about the next steps and treatment options after testing.  We will keep her posted as to the test results by phone call and/or MyChart messaging where possible.  We will plan to follow-up in sleep clinic accordingly as well.  I answered all her questions today and the patient was in agreement.   I encouraged her to call with any interim questions, concerns, problems or updates or email [place] through [place].  Generally speaking, sleep test authorizations may take up to 2 weeks, sometimes less, sometimes longer, the patient is encouraged to get in touch with [place] if they do not hear back from the sleep lab staff directly within the next 2 weeks.  Thank you very much for allowing me to participate in the care of this nice patient. If I can be of any further assistance to you please do not hesitate to call me at [phone].  Sincerely,   Star Age, MD, PhD

## 2022-09-25 NOTE — Telephone Encounter (Signed)
Requested by interface surescripts. Medication not on medication list. Last ordered 12/12/20.  Requested Prescriptions  Refused Prescriptions Disp Refills  . ACCU-CHEK GUIDE test strip [Pharmacy Med Name: ACCU-CHEK GUIDE STRIP] 100 each     Sig: USE TO TEST ONCE DAILY AS DIRECTED     Endocrinology: Diabetes - Testing Supplies Passed - 09/24/2022  2:23 PM      Passed - Valid encounter within last 12 months    Recent Outpatient Visits          2 weeks ago Viral upper respiratory tract infection   [facility] Jon Billings, NP   2 months ago Hypertension associated with diabetes [facility])   [facility] Jon Billings, NP   5 months ago Chronic diastolic CHF (congestive heart failure) ([place])   [facility] Jon Billings, NP   5 months ago Chronic diastolic CHF (congestive heart failure) ([place])   [place], Jolene T, NP   6 months ago Upper respiratory tract infection, unspecified type   [facility] Charlynne Cousins, MD      Future Appointments            In 2 weeks Jon Billings, NP [facility], [place]

## 2022-09-25 NOTE — Patient Instructions (Signed)
It was nice to meet you today.    Based on your symptoms and your exam I believe you are still at risk for obstructive sleep apnea (aka OSA). We should proceed with a sleep study to determine whether you do or do not have OSA and how severe it is. Even, if you have mild OSA, I may want you to consider treatment with CPAP, as treatment of even borderline or mild sleep apnea can result and improvement of symptoms such as sleep disruption, daytime sleepiness, nighttime bathroom breaks, restless leg symptoms, improvement of headache syndromes, even improved mood disorder.   As explained, an attended sleep study (meaning you get to stay overnight in the sleep lab), lets [place] monitor sleep-related behaviors such as sleep talking and leg movements in sleep, in addition to monitoring for sleep apnea.  A home sleep test is a screening tool for sleep apnea diagnosis only, but unfortunately, does not help with any other sleep-related diagnoses.  Please remember, the long-term risks and ramifications of untreated moderate to severe obstructive sleep apnea may include (but are not limited to): increased risk for cardiovascular disease, including congestive heart failure, stroke, difficult to control hypertension, treatment resistant obesity, arrhythmias, especially irregular heartbeat commonly known as A. Fib. (atrial fibrillation); even type 2 diabetes has been linked to untreated OSA.   Other correlations that untreated obstructive sleep apnea include macular edema which is swelling of the retina in the eyes, droopy eyelid syndrome, and elevated hemoglobin and hematocrit levels (often referred to as polycythemia).  Sleep apnea can cause disruption of sleep and sleep deprivation in most cases, which, in turn, can cause recurrent headaches, problems with memory, mood, concentration, focus, and vigilance. Most people with untreated sleep apnea report excessive daytime sleepiness, which can affect their ability to drive.  Please do not drive or use heavy equipment or machinery, if you feel sleepy! Patients with sleep apnea can also develop difficulty initiating and maintaining sleep (aka insomnia).   Having sleep apnea may increase your risk for other sleep disorders, including involuntary behaviors sleep such as sleep terrors, sleep talking, sleepwalking.    Having sleep apnea can also increase your risk for restless leg syndrome and leg movements at night.   Please note that untreated obstructive sleep apnea may carry additional perioperative morbidity. Patients with significant obstructive sleep apnea (typically, in the moderate to severe degree) should receive, if possible, perioperative PAP (positive airway pressure) therapy and the surgeons and particularly the anesthesiologists should be informed of the diagnosis and the severity of the sleep disordered breathing.   We will call you or email you through [place] with regards to your test results and plan a follow-up in sleep clinic accordingly. Most likely, you will hear from one of our nurses.   Our sleep lab administrative assistant will call you to schedule your sleep study and give you further instructions, regarding the check in process for the sleep study, arrival time, what to bring, when you can expect to leave after the study, etc., and to answer any other logistical questions you may have. If you don't hear back from her by about 2 weeks from now, please feel free to call her direct line at [phone] or you can call our general clinic number, or email [place] through My Chart.

## 2022-09-28 DIAGNOSIS — M5416 Radiculopathy, lumbar region: Secondary | ICD-10-CM | POA: Diagnosis not present

## 2022-10-15 ENCOUNTER — Ambulatory Visit: Payer: Medicare PPO | Admitting: Nurse Practitioner

## 2022-10-15 ENCOUNTER — Encounter: Payer: Self-pay | Admitting: Nurse Practitioner

## 2022-10-15 VITALS — BP 126/82 | HR 103 | Temp 97.6°F | Wt 171.9 lb

## 2022-10-15 DIAGNOSIS — I482 Chronic atrial fibrillation, unspecified: Secondary | ICD-10-CM | POA: Diagnosis not present

## 2022-10-15 DIAGNOSIS — F0280 Dementia in other diseases classified elsewhere without behavioral disturbance: Secondary | ICD-10-CM

## 2022-10-15 DIAGNOSIS — E039 Hypothyroidism, unspecified: Secondary | ICD-10-CM

## 2022-10-15 DIAGNOSIS — E782 Mixed hyperlipidemia: Secondary | ICD-10-CM

## 2022-10-15 DIAGNOSIS — F320 Major depressive disorder, single episode, mild: Secondary | ICD-10-CM

## 2022-10-15 DIAGNOSIS — E1169 Type 2 diabetes mellitus with other specified complication: Secondary | ICD-10-CM | POA: Diagnosis not present

## 2022-10-15 DIAGNOSIS — G4733 Obstructive sleep apnea (adult) (pediatric): Secondary | ICD-10-CM

## 2022-10-15 DIAGNOSIS — Z23 Encounter for immunization: Secondary | ICD-10-CM | POA: Diagnosis not present

## 2022-10-15 DIAGNOSIS — R053 Chronic cough: Secondary | ICD-10-CM

## 2022-10-15 DIAGNOSIS — Z0000 Encounter for general adult medical examination without abnormal findings: Secondary | ICD-10-CM

## 2022-10-15 DIAGNOSIS — I152 Hypertension secondary to endocrine disorders: Secondary | ICD-10-CM

## 2022-10-15 DIAGNOSIS — I739 Peripheral vascular disease, unspecified: Secondary | ICD-10-CM | POA: Diagnosis not present

## 2022-10-15 DIAGNOSIS — E781 Pure hyperglyceridemia: Secondary | ICD-10-CM | POA: Diagnosis not present

## 2022-10-15 DIAGNOSIS — E1142 Type 2 diabetes mellitus with diabetic polyneuropathy: Secondary | ICD-10-CM | POA: Diagnosis not present

## 2022-10-15 DIAGNOSIS — I5032 Chronic diastolic (congestive) heart failure: Secondary | ICD-10-CM

## 2022-10-15 DIAGNOSIS — E1159 Type 2 diabetes mellitus with other circulatory complications: Secondary | ICD-10-CM | POA: Diagnosis not present

## 2022-10-15 DIAGNOSIS — I2781 Cor pulmonale (chronic): Secondary | ICD-10-CM | POA: Diagnosis not present

## 2022-10-15 DIAGNOSIS — F419 Anxiety disorder, unspecified: Secondary | ICD-10-CM

## 2022-10-15 DIAGNOSIS — G301 Alzheimer's disease with late onset: Secondary | ICD-10-CM

## 2022-10-15 LAB — URINALYSIS, ROUTINE W REFLEX MICROSCOPIC
Bilirubin, UA: NEGATIVE
Glucose, UA: NEGATIVE
Ketones, UA: NEGATIVE
Nitrite, UA: NEGATIVE
Protein,UA: NEGATIVE
RBC, UA: NEGATIVE
Specific Gravity, UA: 1.015 (ref 1.005–1.030)
Urobilinogen, Ur: 0.2 mg/dL (ref 0.2–1.0)
pH, UA: 6 (ref 5.0–7.5)

## 2022-10-15 LAB — MICROALBUMIN / CREATININE URINE RATIO
Creatinine, Urine: 50 mg/dL
Microalb/Creat Ratio: <30 mg/g creat (ref 0–29)
Microalbumin, Urine: 10 ug/mL

## 2022-10-15 MED ORDER — SPIRIVA RESPIMAT 1.25 MCG/ACT IN AERS: 2.0000 | INHALATION_SPRAY | Freq: Every day | RESPIRATORY_TRACT | 1 refills | Status: DC

## 2022-10-15 MED ORDER — ALBUTEROL SULFATE (2.5 MG/3ML) 0.083% IN NEBU
2.5000 mg | INHALATION_SOLUTION | Freq: Once | RESPIRATORY_TRACT | Status: AC
  Administered 2022-10-15: 2.5 mg via RESPIRATORY_TRACT

## 2022-10-15 NOTE — Assessment & Plan Note (Addendum)
Chronic.  Controlled.  Continue with current medication regimen.  Labs ordered today.  Return to clinic in 3 months for reevaluation.  Call sooner if concerns arise.   

## 2022-10-15 NOTE — Assessment & Plan Note (Signed)
Chronic.  Controlled.  Continue with Imdur '30mg'$ , Amiodarone '200mg'$  and Metoprolol '100mg'$ .  Labs ordered today.  Follow up in 3 months for reevaluation.

## 2022-10-15 NOTE — Assessment & Plan Note (Signed)
Chronic, ongoing with current ongoing cough.  Continue to collaborate with cardiology.  With her diabetes may benefit from addition of Jardiance or [place] for heart if kidney function allows. Now seeing Cardiology, Dr. Josefa Half.  Recommend: - Reminded to call for an overnight weight gain of >2 pounds or a weekly weight gain of >5 pounds - not adding salt to food and read food labels. Reviewed the importance of keeping daily sodium intake to '2000mg'$  daily. - Avoid Ibuprofen products.

## 2022-10-15 NOTE — Assessment & Plan Note (Signed)
Labs ordered today.  Will make recommendations based on lab results. ?

## 2022-10-15 NOTE — Assessment & Plan Note (Signed)
Chronic. Stable on Aricept.  Continue with current medication regimen.  Continue to follow up with Neurology.

## 2022-10-15 NOTE — Assessment & Plan Note (Signed)
Chronic. Labs ordered today.  Last A1c 7.1.  May benefit from [place] due to HF.  Will make recommendations based on lab results.  Follow up in 3 months. Call sooner if concerns arise.

## 2022-10-15 NOTE — Assessment & Plan Note (Signed)
Chronic. Does not currently use CPAP.  Working with Neurology for updated sleep study to help with fatigue and OSA.  Continue to follow their recommendations.

## 2022-10-15 NOTE — Progress Notes (Signed)
Results discussed with patient during visit.

## 2022-10-15 NOTE — Assessment & Plan Note (Signed)
Chronic. Labs ordered today.  May benefit from [place] due to HF.  Will make recommendations based on lab results.

## 2022-10-15 NOTE — Progress Notes (Signed)
BP 126/82   Pulse (!) 103   Temp 97.6 F (36.4 C) (Oral)   Wt 171 lb 14.4 oz (78 kg)   SpO2 96%   BMI 27.75 kg/m    Subjective:    Patient ID: Alison Richardson, female    DOB: [birth year], 88 y.o.   MRN: [id number]  HPI: Alison Richardson is a 88 y.o. female presenting on 10/15/2022 for comprehensive medical examination. Current medical complaints include: cough and congestion  She currently lives with: Menopausal Symptoms: no  HYPERTENSION / HYPERLIPIDEMIA Recently saw cardiology in late September who said she was in Afib.  Another appt with Cardiology around November 4. Satisfied with current treatment? yes Duration of hypertension: years BP monitoring frequency: daily BP range: 130-140/80 BP medication side effects: no Past BP meds: Imdur Duration of hyperlipidemia: years Cholesterol medication side effects: no Cholesterol supplements: none Past cholesterol medications: pravastatin (pravachol) Medication compliance: excellent compliance Aspirin: no Recent stressors: no Recurrent headaches: yes  Visual changes: no Palpitations: no Dyspnea: yes Chest pain: no Lower extremity edema: no Dizzy/lightheaded: no  DIABETES Hypoglycemic episodes:no Polydipsia/polyuria: no Visual disturbance: no Chest pain: no Paresthesias: no Glucose Monitoring: no  Accucheck frequency: Not Checking  Fasting glucose:  Post prandial:  Evening:  Before meals: Taking Insulin?: no  Long acting insulin:  Short acting insulin: Blood Pressure Monitoring: daily  HYPOTHYROIDISM Thyroid control status:controlled Satisfied with current treatment? yes Medication side effects: no Medication compliance: excellent compliance Etiology of hypothyroidism:  Recent dose adjustment:no Fatigue: no Cold intolerance: no Heat intolerance: no Weight gain: no Weight loss: no Constipation: no Diarrhea/loose stools: no Palpitations: no Lower extremity edema: no Anxiety/depressed mood:  no  COUGH Duration:  3-4 months Circumstances of initial development of cough: URI Cough severity: severe Cough description: productive Aggravating factors:  worse in the AM Alleviating factors:  coughing up the congestion Status:  stable Treatments attempted:  antihistamine, zyrtec and helps in the immediate Wheezing: yes Shortness of breath: yes Chest pain: no Chest tightness:yes Nasal congestion: yes Runny nose: yes Postnasal drip: yes Frequent throat clearing or swallowing: yes Hemoptysis: no Fevers: no Night sweats: no Weight loss: no Heartburn: no Recent foreign travel: no Tuberculosis contacts: no    Depression Screen done today and results listed below:     08/21/2022    1:47 PM 03/20/2022    9:35 AM 01/22/2022    5:07 PM 01/11/2022   10:23 AM 05/12/2021   11:18 AM  Depression screen PHQ 2/9  Decreased Interest 0 0 1 2 0  Down, Depressed, Hopeless 0 0 0 1 0  PHQ - 2 Score 0 0 1 3 0  Altered sleeping   3 2   Tired, decreased energy   3 3   Change in appetite   2 2   Feeling bad or failure about yourself    0 0   Trouble concentrating   0 0   Moving slowly or fidgety/restless   0 0   Suicidal thoughts   0 0   PHQ-9 Score   9 10   Difficult doing work/chores   Somewhat difficult Somewhat difficult     The patient did a history of falls. I did complete a risk assessment for falls. A plan of care for falls was documented.   Past Medical History:  Past Medical History:  Diagnosis Date   Arrhythmia    Atrial flutter ([place]) 01/2018   new onset    Basal cell carcinoma of back    Basal cell  carcinoma of lip    Cerebral aneurysm    followed by [facility]   CHF (congestive heart failure) (HCC)    DDD (degenerative disc disease), lumbar    superior plate depression, L3 08/18/2014   Diabetes mellitus type II, controlled ([place])    Diverticulosis    Dysrhythmia    Paroxysmal Supraventricular Tachycardia   Dysthymia    depression   GERD (gastroesophageal reflux  disease)    History of meniscal tear    Hyperlipidemia    Hypertension    Hypothyroidism    Late onset Alzheimer's disease with behavioral disturbance (HCC)    Mild pulmonary hypertension (HCC)    Overactive bladder    Peripheral vascular disease (HCC)    Seasonal allergic rhinitis    Sleep apnea     Surgical History:  Past Surgical History:  Procedure Laterality Date   APPENDECTOMY  1946   BASAL CELL CARCINOMA EXCISION     2006 and 2009 removed from back and lip   CARDIOVASCULAR STRESS TEST  2015   nuclear cardiac stress test negative for ischemia - Dr. Satira Sark   CATARACT EXTRACTION, BILATERAL  2006   COLONOSCOPY  2014   COLONOSCOPY N/A 08/19/2020   Procedure: COLONOSCOPY;  Surgeon: Lesly Rubenstein, MD;  Location: [facility] ENDOSCOPY;  Service: Endoscopy;  Laterality: N/A;   ECTOPIC PREGNANCY SURGERY  1957   ESOPHAGOGASTRODUODENOSCOPY N/A 08/19/2020   Procedure: ESOPHAGOGASTRODUODENOSCOPY (EGD);  Surgeon: Lesly Rubenstein, MD;  Location: [facility] ENDOSCOPY;  Service: Endoscopy;  Laterality: N/A;   INJECTION KNEE Left 05/2015   LEFT HEART CATH AND CORONARY ANGIOGRAPHY N/A 10/12/2021   Procedure: LEFT HEART CATH AND CORONARY ANGIOGRAPHY;  Surgeon: Corey Skains, MD;  Location: [facility];  Service: Cardiovascular;  Laterality: N/A;   REPLACEMENT TOTAL KNEE Left 09/06/2016   Dr. Barnet Pall   TONSILLECTOMY AND ADENOIDECTOMY  1953   TOTAL ABDOMINAL HYSTERECTOMY  1986   [facility]    Medications:  Current Outpatient Medications on File Prior to Visit  Medication Sig   albuterol (VENTOLIN HFA) 108 (90 Base) MCG/ACT inhaler INHALE 2 PUFFS INTO THE LUNGS EVERY 6 HOURS AS NEEDED FOR WHEEZING OR SHORTNESS OF BREATH   amiodarone (PACERONE) 200 MG tablet Take 200 mg by mouth daily.    clopidogrel (PLAVIX) 75 MG tablet Take 75 mg by mouth daily.   donepezil (ARICEPT) 10 MG tablet TAKE ONE TABLET (10 MG) BY MOUTH AT BEDTIME   ELIQUIS 5 MG TABS tablet Take 5 mg by mouth 2 (two) times  daily.    famotidine (PEPCID) 20 MG tablet Take 1 tablet (20 mg total) by mouth daily.   gabapentin (NEURONTIN) 100 MG capsule Take 100 mg by mouth daily.   glipiZIDE (GLUCOTROL XL) 2.5 MG 24 hr tablet TAKE ONE TABLET BY MOUTH ONCE DAILY   isosorbide mononitrate (IMDUR) 30 MG 24 hr tablet Take 30 mg by mouth daily.   levothyroxine (SYNTHROID) 88 MCG tablet Take 1 tablet (88 mcg total) by mouth daily.   metoprolol succinate (TOPROL-XL) 100 MG 24 hr tablet TAKE ONE TABLET (100 MG) BY MOUTH EVERY DAY (TAKE WITH OR IMMEDIATELY AFTER A MEAL)   montelukast (SINGULAIR) 10 MG tablet Take 1 tablet (10 mg total) by mouth at bedtime.   omeprazole (PRILOSEC) 20 MG capsule Take 1 capsule (20 mg total) by mouth 2 (two) times daily before a meal.   pravastatin (PRAVACHOL) 20 MG tablet Take 20 mg by mouth at bedtime.   torsemide (DEMADEX) 20 MG tablet Take  1 tablet (20 mg total) by mouth daily.   venlafaxine XR (EFFEXOR-XR) 75 MG 24 hr capsule TAKE 1 CAPSULE BY MOUTH ONCE DAILY.   nitroGLYCERIN (NITROSTAT) 0.4 MG SL tablet Place 0.4 mg under the tongue every 5 (five) minutes as needed for chest pain.   oxyCODONE (OXY IR/ROXICODONE) 5 MG immediate release tablet Take 1 tablet (5 mg total) by mouth every 4 (four) hours as needed for severe pain or breakthrough pain. (Patient not taking: Reported on 10/15/2022)   No current facility-administered medications on file prior to visit.    Allergies:  Allergies  Allergen Reactions   Pantoprazole Nausea And Vomiting   Metformin And Related Diarrhea    Social History:  Social History   Socioeconomic History   Marital status: Widowed    Spouse name: Not on file   Number of children: 5   Years of education: Not on file   Highest education level: Not on file  Occupational History   Not on file  Tobacco Use   Smoking status: Never   Smokeless tobacco: Never  Vaping Use   Vaping Use: Never used  Substance and Sexual Activity   Alcohol use: Yes     Alcohol/week: 3.0 standard drinks of alcohol    Types: 3 Shots of liquor per week    Comment: 3 x week    Drug use: Never   Sexual activity: Not Currently  Other Topics Concern   Not on file  Social History Narrative   Not on file   Social Determinants of Health   Financial Resource Strain: Low Risk  (03/20/2022)   Overall Financial Resource Strain (CARDIA)    Difficulty of Paying Living Expenses: Not hard at all  Food Insecurity: No Food Insecurity (03/20/2022)   Hunger Vital Sign    Worried About Running Out of Food in the Last Year: Never true    Ran Out of Food in the Last Year: Never true  Transportation Needs: No Transportation Needs (03/20/2022)   PRAPARE - Hydrologist (Medical): No    Lack of Transportation (Non-Medical): No  Physical Activity: Insufficiently Active (03/20/2022)   Exercise Vital Sign    Days of Exercise per Week: 2 days    Minutes of Exercise per Session: 30 min  Stress: No Stress Concern Present (03/20/2022)   [place]    Feeling of Stress : Not at all  Social Connections: Moderately Integrated (03/20/2022)   Social Connection and Isolation Panel [NHANES]    Frequency of Communication with Friends and Family: More than three times a week    Frequency of Social Gatherings with Friends and Family: More than three times a week    Attends Religious Services: More than 4 times per year    Active Member of [employer] or Organizations: Yes    Attends Archivist Meetings: More than 4 times per year    Marital Status: Widowed  Intimate Partner Violence: Not At Risk (03/20/2022)   Humiliation, Afraid, Rape, and Kick questionnaire    Fear of Current or Ex-Partner: No    Emotionally Abused: No    Physically Abused: No    Sexually Abused: No   Social History   Tobacco Use  Smoking Status Never  Smokeless Tobacco Never   Social History   Substance and Sexual  Activity  Alcohol Use Yes   Alcohol/week: 3.0 standard drinks of alcohol   Types: 3 Shots of liquor per  week   Comment: 3 x week     Family History:  Family History  Problem Relation Age of Onset   Hypertension Mother    Heart disease Father    CAD Father    Heart disease Sister    Diabetes Sister    Diabetes Paternal Uncle    Sleep apnea Son     Past medical history, surgical history, medications, allergies, family history and social history reviewed with patient today and changes made to appropriate areas of the chart.   Review of Systems  Constitutional:  Negative for fever and weight loss.  Eyes:  Negative for blurred vision and double vision.  Respiratory:  Positive for cough, shortness of breath and wheezing.   Cardiovascular:  Negative for chest pain, palpitations and leg swelling.  Neurological:  Positive for headaches. Negative for dizziness.   All other ROS negative except what is listed above and in the HPI.      Objective:    BP 126/82   Pulse (!) 103   Temp 97.6 F (36.4 C) (Oral)   Wt 171 lb 14.4 oz (78 kg)   SpO2 96%   BMI 27.75 kg/m   Wt Readings from Last 3 Encounters:  10/15/22 171 lb 14.4 oz (78 kg)  09/25/22 170 lb (77.1 kg)  09/10/22 170 lb 1.6 oz (77.2 kg)    Physical Exam Vitals and nursing note reviewed.  Constitutional:      General: She is not in acute distress.    Appearance: Normal appearance. She is normal weight. She is not ill-appearing, toxic-appearing or diaphoretic.  HENT:     Head: Normocephalic.     Right Ear: External ear normal.     Left Ear: External ear normal.     Nose: Nose normal.     Mouth/Throat:     Mouth: Mucous membranes are moist.     Pharynx: Oropharynx is clear.  Eyes:     General:        Right eye: No discharge.        Left eye: No discharge.     Extraocular Movements: Extraocular movements intact.     Conjunctiva/sclera: Conjunctivae normal.     Pupils: Pupils are equal, round, and reactive to light.   Cardiovascular:     Rate and Rhythm: Normal rate and regular rhythm.     Heart sounds: No murmur heard. Pulmonary:     Effort: Pulmonary effort is normal. No respiratory distress.     Breath sounds: Normal breath sounds. No wheezing or rales.  Musculoskeletal:     Cervical back: Normal range of motion and neck supple.  Skin:    General: Skin is warm and dry.     Capillary Refill: Capillary refill takes less than 2 seconds.  Neurological:     General: No focal deficit present.     Mental Status: She is alert and oriented to person, place, and time. Mental status is at baseline.  Psychiatric:        Mood and Affect: Mood normal.        Behavior: Behavior normal.        Thought Content: Thought content normal.        Judgment: Judgment normal.     Results for orders placed or performed in visit on 09/27/22  HM DIABETES EYE EXAM  Result Value Ref Range   HM Diabetic Eye Exam No Retinopathy No Retinopathy      Assessment & Plan:   Problem List Items  Addressed This Visit       Cardiovascular and Mediastinum   Hypertension associated with diabetes ([place])    Chronic.  Controlled.  Continue with Imdur '30mg'$ , Amiodarone '200mg'$  and Metoprolol '100mg'$ .  Labs ordered today.  Follow up in 3 months for reevaluation.  Seeing Cardiology.  Continue to follow their recommendations.      PAD (peripheral artery disease) (HCC)    Chronic.  Controlled.  Continue with current medication regimen.  Labs ordered today.  Return to clinic in 3 months for reevaluation.  Call sooner if concerns arise.        Chronic diastolic CHF (congestive heart failure) (HCC)    Chronic, ongoing with current ongoing cough.  Continue to collaborate with cardiology.  With her diabetes may benefit from addition of Jardiance or [place] for heart if kidney function allows. Now seeing Cardiology, Dr. Josefa Half.  Recommend: - Reminded to call for an overnight weight gain of >2 pounds or a weekly weight gain of >5 pounds -  not adding salt to food and read food labels. Reviewed the importance of keeping daily sodium intake to '2000mg'$  daily. - Avoid Ibuprofen products.      Cor pulmonale, chronic (HCC)    Chronic, ongoing with current ongoing cough.  Continue to collaborate with cardiology.  With her diabetes may benefit from addition of Jardiance or [place] for heart if kidney function allows. Now seeing Cardiology, Dr. Josefa Half.  Recommend: - Reminded to call for an overnight weight gain of >2 pounds or a weekly weight gain of >5 pounds - not adding salt to food and read food labels. Reviewed the importance of keeping daily sodium intake to '2000mg'$  daily. - Avoid Ibuprofen products.      Atrial fibrillation, chronic (HCC)    Chronic.  Controlled.  Continue with Imdur '30mg'$ , Amiodarone '200mg'$  and Metoprolol '100mg'$ .  Labs ordered today.  Follow up in 3 months for reevaluation.        Respiratory   Obstructive sleep apnea    Chronic. Does not currently use CPAP.  Working with Neurology for updated sleep study to help with fatigue and OSA.  Continue to follow their recommendations.        Endocrine   Hypothyroidism    Labs ordered today. Will make recommendations based on lab results.      Relevant Orders   TSH   T4, free   Type 2 diabetes mellitus with peripheral neuropathy (HCC)    Chronic. Labs ordered today.  Last A1c 7.1.  May benefit from [place] due to HF.  Will make recommendations based on lab results.  Follow up in 3 months. Call sooner if concerns arise.      DM type 2 with diabetic mixed hyperlipidemia (HCC)    Chronic. Labs ordered today.  May benefit from [place] due to HF.  Will make recommendations based on lab results.      Relevant Orders   Urine Microalbumin w/creat. ratio   HgB A1c     Nervous and Auditory   Late onset Alzheimer's disease without behavioral disturbance (HCC)    Chronic. Stable on Aricept.  Continue with current medication regimen.   Continue to follow up with Neurology.         Other   Anxiety disorder    Chronic.  Controlled.  Continue with current medication regimen.  Labs ordered today.  Return to clinic in 3 months for reevaluation.  Call sooner if concerns arise.  Depression, major, single episode, mild (HCC)    Chronic.  Controlled.  Continue with current medication regimen.  Labs ordered today.  Return to clinic in 3 months for reevaluation.  Call sooner if concerns arise.        Hypertriglyceridemia    Chronic.  Controlled.  Continue with current medication regimen.  Labs ordered today.  Return to clinic in 3 months for reevaluation.  Call sooner if concerns arise.        Relevant Orders   Lipid panel   Chronic cough    Chronic x 3-4 months.  Associated with congestion and coughing up Phlegm.  Taking Montelukast and Zyrtec without relief.  Has to carry cough drops everywhere she goes.  Spirometry done in office today which was inconclusive.  Suspect some reactive airway disease.  Will start Spiriva to see if symptoms improve.  Referral placed for Pulmonology.  Follow up in 3 months.  Call sooner if concerns arise.      Relevant Medications   albuterol (PROVENTIL) (2.5 MG/3ML) 0.083% nebulizer solution 2.5 mg   Other Relevant Orders   Spirometry with graph (Completed)   Ambulatory referral to Pulmonology   Other Visit Diagnoses     Annual physical exam    -  Primary   Health maintenance reviewed during visit today.  Labs ordered. Pneumonia and Flu given.     Relevant Orders   CBC with Differential/Platelet   Comprehensive metabolic panel   Lipid panel   TSH   Urinalysis, Routine w reflex microscopic   Urine Microalbumin w/creat. ratio   HgB A1c   Need for pneumococcal 20-valent conjugate vaccination       Relevant Orders   Pneumococcal conjugate vaccine 20-valent (Prevnar 20) (Completed)        Follow up plan: No follow-ups on file.   LABORATORY TESTING:  - Pap smear: not  applicable  IMMUNIZATIONS:   - Tdap: Tetanus vaccination status reviewed: last tetanus booster within 10 years. - Influenza: Up to date - Pneumovax: Administered today - Prevnar: Administered today - COVID: Not applicable - HPV: Not applicable - Shingrix vaccine: Up to date  SCREENING: -Mammogram: NA - Colonoscopy: NA - Bone Density: Not applicable  -Hearing Test: Not applicable  -Spirometry: Not applicable   PATIENT COUNSELING:   Advised to take 1 mg of folate supplement per day if capable of pregnancy.   Sexuality: Discussed sexually transmitted diseases, partner selection, use of condoms, avoidance of unintended pregnancy  and contraceptive alternatives.   Advised to avoid cigarette smoking.  I discussed with the patient that most people either abstain from alcohol or drink within safe limits (<=14/week and <=4 drinks/occasion for males, <=7/weeks and <= 3 drinks/occasion for females) and that the risk for alcohol disorders and other health effects rises proportionally with the number of drinks per week and how often a drinker exceeds daily limits.  Discussed cessation/primary prevention of drug use and availability of treatment for abuse.   Diet: Encouraged to adjust caloric intake to maintain  or achieve ideal body weight, to reduce intake of dietary saturated fat and total fat, to limit sodium intake by avoiding high sodium foods and not adding table salt, and to maintain adequate dietary potassium and calcium preferably from fresh fruits, vegetables, and low-fat dairy products.    stressed the importance of regular exercise  Injury prevention: Discussed safety belts, safety helmets, smoke detector, smoking near bedding or upholstery.   Dental health: Discussed importance of regular tooth brushing, flossing, and dental  visits.    NEXT PREVENTATIVE PHYSICAL DUE IN 1 YEAR. No follow-ups on file.

## 2022-10-15 NOTE — Assessment & Plan Note (Signed)
Chronic.  Controlled.  Continue with Imdur '30mg'$ , Amiodarone '200mg'$  and Metoprolol '100mg'$ .  Labs ordered today.  Follow up in 3 months for reevaluation.  Seeing Cardiology.  Continue to follow their recommendations.

## 2022-10-15 NOTE — Assessment & Plan Note (Addendum)
Chronic x 3-4 months.  Associated with congestion and coughing up Phlegm.  Taking Montelukast and Zyrtec without relief.  Has to carry cough drops everywhere she goes.  Spirometry done in office today which was inconclusive.  Suspect some reactive airway disease.  Will start Spiriva to see if symptoms improve.  Referral placed for Pulmonology.  Follow up in 3 months.  Call sooner if concerns arise.

## 2022-10-15 NOTE — Assessment & Plan Note (Signed)
Chronic.  Controlled.  Continue with current medication regimen.  Labs ordered today.  Return to clinic in 3 months for reevaluation.  Call sooner if concerns arise.   

## 2022-10-16 LAB — HEMOGLOBIN A1C
Est. average glucose Bld gHb Est-mCnc: 171 mg/dL
Hgb A1c MFr Bld: 7.6 % — ABNORMAL HIGH (ref 4.8–5.6)

## 2022-10-16 LAB — CBC WITH DIFFERENTIAL/PLATELET
Basophils Absolute: 0.1 10*3/uL (ref 0.0–0.2)
Basos: 1 %
EOS (ABSOLUTE): 0.3 10*3/uL (ref 0.0–0.4)
Eos: 3 %
Hematocrit: 37.5 % (ref 34.0–46.6)
Hemoglobin: 12.3 g/dL (ref 11.1–15.9)
Immature Grans (Abs): 0 10*3/uL (ref 0.0–0.1)
Immature Granulocytes: 0 %
Lymphocytes Absolute: 2.8 10*3/uL (ref 0.7–3.1)
Lymphs: 28 %
MCH: 28 pg (ref 26.6–33.0)
MCHC: 32.8 g/dL (ref 31.5–35.7)
MCV: 85 fL (ref 79–97)
Monocytes Absolute: 0.8 10*3/uL (ref 0.1–0.9)
Monocytes: 8 %
Neutrophils Absolute: 6.1 10*3/uL (ref 1.4–7.0)
Neutrophils: 60 %
Platelets: 356 10*3/uL (ref 150–450)
RBC: 4.4 x10E6/uL (ref 3.77–5.28)
RDW: 14.5 % (ref 11.7–15.4)
WBC: 10.1 10*3/uL (ref 3.4–10.8)

## 2022-10-16 LAB — LIPID PANEL
Chol/HDL Ratio: 4.1 ratio (ref 0.0–4.4)
Cholesterol, Total: 171 mg/dL (ref 100–199)
HDL: 42 mg/dL (ref >39)
LDL Chol Calc (NIH): 91 mg/dL (ref 0–99)
Triglycerides: 228 mg/dL — ABNORMAL HIGH (ref 0–149)
VLDL Cholesterol Cal: 38 mg/dL (ref 5–40)

## 2022-10-16 LAB — COMPREHENSIVE METABOLIC PANEL
ALT: 23 IU/L (ref 0–32)
AST: 24 IU/L (ref 0–40)
Albumin/Globulin Ratio: 1.7 (ref 1.2–2.2)
Albumin: 4.5 g/dL (ref 3.7–4.7)
Alkaline Phosphatase: 130 IU/L — ABNORMAL HIGH (ref 44–121)
BUN/Creatinine Ratio: 16 (ref 12–28)
BUN: 15 mg/dL (ref 8–27)
Bilirubin Total: 0.2 mg/dL (ref 0.0–1.2)
CO2: 20 mmol/L (ref 20–29)
Calcium: 9.4 mg/dL (ref 8.7–10.3)
Chloride: 103 mmol/L (ref 96–106)
Creatinine, Ser: 0.95 mg/dL (ref 0.57–1.00)
Globulin, Total: 2.7 g/dL (ref 1.5–4.5)
Glucose: 151 mg/dL — ABNORMAL HIGH (ref 70–99)
Potassium: 3.5 mmol/L (ref 3.5–5.2)
Sodium: 145 mmol/L — ABNORMAL HIGH (ref 134–144)
Total Protein: 7.2 g/dL (ref 6.0–8.5)
eGFR: 58 mL/min/{1.73_m2} — ABNORMAL LOW (ref >59)

## 2022-10-16 LAB — TSH: TSH: 4.94 u[IU]/mL — ABNORMAL HIGH (ref 0.450–4.500)

## 2022-10-16 LAB — T4, FREE: Free T4: 1.8 ng/dL — ABNORMAL HIGH (ref 0.82–1.77)

## 2022-10-16 NOTE — Progress Notes (Signed)
Please let patietn know that her a1c increased again to 7.6.  I think she would benefit from a medication called [place].  It will help with her A1c, kidney protection and heart protection.  It would be '10mg'$  once daily.    Her thyroid labs are slightly off.  Please make sure she is taking the medication first thing in the morning on an empty stomach with only water and waiting at least 30 minutes to eat or drink anything else.

## 2022-10-23 DIAGNOSIS — I4892 Unspecified atrial flutter: Secondary | ICD-10-CM | POA: Diagnosis not present

## 2022-10-23 DIAGNOSIS — I4891 Unspecified atrial fibrillation: Secondary | ICD-10-CM | POA: Diagnosis not present

## 2022-10-24 ENCOUNTER — Other Ambulatory Visit: Payer: Self-pay | Admitting: Nurse Practitioner

## 2022-10-24 NOTE — Telephone Encounter (Signed)
Requested Prescriptions  Pending Prescriptions Disp Refills  . metoprolol succinate (TOPROL-XL) 100 MG 24 hr tablet [Pharmacy Med Name: METOPROLOL SUCCINATE ER 100 MG TAB] 90 tablet 0    Sig: TAKE ONE TABLET (100 MG) BY MOUTH EVERY DAY (TAKE WITH OR IMMEDIATELY AFTER A MEAL)     Cardiovascular:  Beta Blockers Passed - 10/24/2022 11:26 AM      Passed - Last BP in normal range    BP Readings from Last 1 Encounters:  10/15/22 126/82         Passed - Last Heart Rate in normal range    Pulse Readings from Last 1 Encounters:  10/15/22 (!) 103         Passed - Valid encounter within last 6 months    Recent Outpatient Visits          1 week ago Annual physical exam   [facility] Jon Billings, NP   1 month ago Viral upper respiratory tract infection   [facility] Jon Billings, NP   3 months ago Hypertension associated with diabetes ([place])   [facility] Jon Billings, NP   6 months ago Chronic diastolic CHF (congestive heart failure) ([place])   [facility] Jon Billings, NP   6 months ago Chronic diastolic CHF (congestive heart failure) ([place])   [place] Venita Lick, NP      Future Appointments            In 2 months Jon Billings, NP [facility]           . famotidine (PEPCID) 20 MG tablet [Pharmacy Med Name: FAMOTIDINE 20 MG TAB] 90 tablet 1    Sig: TAKE 1 TABLET BY MOUTH ONCE DAILY     Gastroenterology:  H2 Antagonists Passed - 10/24/2022 11:26 AM      Passed - Valid encounter within last 12 months    Recent Outpatient Visits          1 week ago Annual physical exam   [facility] Jon Billings, NP   1 month ago Viral upper respiratory tract infection   [facility] Jon Billings, NP   3 months ago Hypertension associated with diabetes [facility])   [facility] Jon Billings, NP   6 months ago Chronic diastolic CHF  (congestive heart failure) ([place])   [facility] Jon Billings, NP   6 months ago Chronic diastolic CHF (congestive heart failure) ([place])   [place] Venita Lick, NP      Future Appointments            In 2 months Jon Billings, NP [facility] [place]

## 2022-10-25 ENCOUNTER — Telehealth: Payer: Self-pay | Admitting: *Deleted

## 2022-10-25 MED ORDER — EMPAGLIFLOZIN 25 MG PO TABS: 25.0000 mg | ORAL_TABLET | Freq: Every day | ORAL | 1 refills | Status: DC

## 2022-10-25 NOTE — Telephone Encounter (Signed)
Patient called for her test results and informed: Please let patietn know that her a1c increased again to 7.6.  I think she would benefit from a medication called [place].  It will help with her A1c, kidney protection and heart protection.  It would be '10mg'$  once daily.     Her thyroid labs are slightly off.  Please make sure she is taking the medication first thing in the morning on an empty stomach with only water and waiting at least 30 minutes to eat or drink anything else.  Patient states she is disappointed it has taken her this long to get results. Advised patient of message nurse got when she reached out- patient states office should try again- her number is not disconnected and office can reach out to [facility] if needed. Please send Rx to pharmacy. Patient also states she does not take her thyroid medication 30 minutes previous to meal always.

## 2022-10-25 NOTE — Addendum Note (Signed)
Addended by: Jon Billings on: 10/25/2022 02:25 PM   Modules accepted: Orders

## 2022-10-25 NOTE — Telephone Encounter (Signed)
Patient's insurance prefers [employer] which offers similar benefits.  That is the medication I sent to the pharmacy.

## 2022-10-25 NOTE — Telephone Encounter (Signed)
Attempted to call patient, no answer. Called [name] (alternate contact) verbalized understanding of medication sent to pharmacy.

## 2022-10-26 DIAGNOSIS — E119 Type 2 diabetes mellitus without complications: Secondary | ICD-10-CM | POA: Diagnosis not present

## 2022-10-29 ENCOUNTER — Telehealth: Payer: Self-pay

## 2022-10-29 NOTE — Telephone Encounter (Signed)
LVM for pt to call back to schedule sleep study.  

## 2022-10-30 ENCOUNTER — Ambulatory Visit: Payer: Self-pay | Admitting: *Deleted

## 2022-10-30 DIAGNOSIS — I4891 Unspecified atrial fibrillation: Secondary | ICD-10-CM | POA: Diagnosis not present

## 2022-10-30 DIAGNOSIS — I4892 Unspecified atrial flutter: Secondary | ICD-10-CM | POA: Diagnosis not present

## 2022-10-30 NOTE — Telephone Encounter (Signed)
Patient has been notified via phone call.  

## 2022-10-30 NOTE — Telephone Encounter (Signed)
Please let patient know that we have contacted the Pulmonology office and they should be reaching out to get her scheduled for an appt.   It is okay for her to stop the Jardiance if she is not tolerating it well.

## 2022-10-30 NOTE — Telephone Encounter (Signed)
Patient was contacted through [facility] for her pulmonology referral.  Can you please have them reach out to her via telephone she is 88 years old and not savy with mychart.

## 2022-10-30 NOTE — Telephone Encounter (Signed)
  Chief Complaint: medication jardiance causing N/V x 2 days since starting  Symptoms: vomiting after taking medication . "Takes all morning to get over it" Frequency: 2 days  Pertinent Negatives: Patient denies na  Disposition: '[]'$ ED /'[]'$ Urgent Care (no appt availability in office) / '[]'$ Appointment(In office/virtual)/ '[]'$  [place] [facility]/ '[]'$ Home Care/ '[]'$ Refused Recommended Disposition /'[]'$ [place] [facility]/ '[x]'$  Follow-up with PCP Additional Notes:  Please advise regarding medication and what patient is to take. Requesting a call back.     Reason for Disposition  [1] Caller has URGENT medicine question about med that PCP or specialist prescribed AND [2] triager unable to answer question  Answer Assessment - Initial Assessment Questions 1. NAME of MEDICINE: "What medicine(s) are you calling about?"     Jardiance 25 mg  2. QUESTION: "What is your question?" (e.g., double dose of medicine, side effect)     What other medication can be prescribed? Does she still need to take glipizide as well daily? 3. PRESCRIBER: "Who prescribed the medicine?" Reason: if prescribed by specialist, call should be referred to that group.     PCP 4. SYMPTOMS: "Do you have any symptoms?" If Yes, ask: "What symptoms are you having?"  "How bad are the symptoms (e.g., mild, moderate, severe)     Vomiting and nausea x 2 days after taking jardiance. 5. PREGNANCY:  "Is there any chance that you are pregnant?" "When was your last menstrual period?"     na  Protocols used: Medication Question Call-A-AH

## 2022-10-31 ENCOUNTER — Ambulatory Visit: Payer: Self-pay | Admitting: *Deleted

## 2022-10-31 NOTE — Telephone Encounter (Signed)
Reason for Disposition  Caller has medicine question only, adult not sick, AND triager answers question  Answer Assessment - Initial Assessment Questions 1. NAME of MEDICINE: "What medicine(s) are you calling about?"     Daughter calling in regarding her mother's BP medication.   She called in yesterday regarding her BP medication.    2. QUESTION: "What is your question?" (e.g., double dose of medicine, side effect)     Her mother called in yesterday to the office and spoke with a nurse yesterday.  Pt. In background.    Daughter then said,  "Never mind, I see what happened".   "My mother called her other doctor, not y'all".    "I'm so sorry".  I let daughter know it looked like it might be her heart dr. Because there was contact made with them however I didn't have their notes open.   Daughter thanked me and said,  "It was her heart dr".    "I'm so sorry again".    "I'll give them a call".   3. PRESCRIBER: "Who prescribed the medicine?" Reason: if prescribed by specialist, call should be referred to that group.     N/A 4. SYMPTOMS: "Do you have any symptoms?" If Yes, ask: "What symptoms are you having?"  "How bad are the symptoms (e.g., mild, moderate, severe)     N/A 5. PREGNANCY:  "Is there any chance that you are pregnant?" "When was your last menstrual period?"     N/A  Protocols used: Medication Question Call-A-AH

## 2022-10-31 NOTE — Telephone Encounter (Signed)
  Chief Complaint: Ended up that pt had called the wrong dr.   Loel Ro to call her cardiologist just now regarding a medication they prescribed but called [facility] instead. Symptoms: N/A Frequency: N/A Pertinent Negatives: Patient denies N/A Disposition: '[]'$ ED /'[]'$ Urgent Care (no appt availability in office) / '[]'$ Appointment(In office/virtual)/ '[]'$  [place] [facility]/ '[]'$ Home Care/ '[]'$ Refused Recommended Disposition /'[]'$ [place] [facility]/ '[]'$  Follow-up with PCP Additional Notes: N/A  Accidentally called the wrong doctor's office.

## 2022-11-05 ENCOUNTER — Ambulatory Visit: Payer: Self-pay | Admitting: *Deleted

## 2022-11-05 NOTE — Telephone Encounter (Signed)
  Chief Complaint: Positive for Covid Symptoms: coughing, chest congestion, tired, headache, some nasal congestion Frequency: tested positive Sunday and again this morning in the clinic where she lives. Pertinent Negatives: Patient denies fever. Disposition: '[]'$ ED /'[]'$ Urgent Care (no appt availability in office) / '[x]'$ Appointment(In office/virtual)/ '[]'$  [place] [facility]/ '[]'$ Home Care/ '[]'$ Refused Recommended Disposition /'[]'$ [place] [facility]/ '[]'$  Follow-up with PCP Additional Notes: Appt. Made with Jon Billings, NP for 2:00 11/06/2022 in person visit.   No comfortable with doing a video visit without someone to help her.

## 2022-11-05 NOTE — Telephone Encounter (Signed)
Message from [employer] sent at 11/05/2022 12:59 PM EST  Summary: cold and flu like symptoms / rx req   The patient has tested positive for COVID 19 via testing at [facility] today 11/05/22  The patient is experiencing cough, congestion, headache and tiredness  The patient would like to be prescribed something for their discomfort  Please contact further when possible          Call History   Type Contact Phone/Fax User  11/05/2022 12:59 PM EST Phone (Incoming) Alison Richardson, Alison Richardson (Self) [phone] Alison Richardson) Richardson, Alison A   Reason for Disposition  [1] HIGH RISK patient (e.g., weak immune system, age > 44 years, obesity with BMI 30 or higher, pregnant, chronic lung disease or other chronic medical condition) AND [2] COVID symptoms (e.g., cough, fever)  (Exceptions: Already seen by PCP and no new or worsening symptoms.)  Answer Assessment - Initial Assessment Questions 1. COVID-19 DIAGNOSIS: "How do you know that you have COVID?" (e.g., positive lab test or self-test, diagnosed by doctor or NP/PA, symptoms after exposure).     Monday I vomited.   Tues. I had a respiratory illness.  I felt bad all day Tues.   I'm still really tired and coughing.   I tested Sunday and got a positive test.   I went to the clinic and testedpositive today.    2. COVID-19 EXPOSURE: "Was there any known exposure to COVID before the symptoms began?" CDC Definition of close contact: within 6 feet (2 meters) for a total of 15 minutes or more over a 24-hour period.      Not asked 3. ONSET: "When did the COVID-19 symptoms start?"      Monday  4. WORST SYMPTOM: "What is your worst symptom?" (e.g., cough, fever, shortness of breath, muscle aches)     coughing 5. COUGH: "Do you have a cough?" If Yes, ask: "How bad is the cough?"       I'm still coughing, tired headache and congestion I'm coughing up clear mucus.   6. FEVER: "Do you have a fever?" If Yes, ask: "What is your temperature, how was it measured, and  when did it start?"     I don't know 7. RESPIRATORY STATUS: "Describe your breathing?" (e.g., normal; shortness of breath, wheezing, unable to speak)      I have shortness of breath off and on.   No chest pain. 8. BETTER-SAME-WORSE: "Are you getting better, staying the same or getting worse compared to yesterday?"  If getting worse, ask, "In what way?"     I'm about the same but I'm better than I was last week. 9. OTHER SYMPTOMS: "Do you have any other symptoms?"  (e.g., chills, fatigue, headache, loss of smell or taste, muscle pain, sore throat)     Headache, nasal and chest congestion, tired, 10. HIGH RISK DISEASE: "Do you have any chronic medical problems?" (e.g., asthma, heart or lung disease, weak immune system, obesity, etc.)       age 88. VACCINE: "Have you had the COVID-19 vaccine?" If Yes, ask: "Which one, how many shots, when did you get it?"       Not asked  12. PREGNANCY: "Is there any chance you are pregnant?" "When was your last menstrual period?"       N/A due to age 63. O2 SATURATION MONITOR:  "Do you use an oxygen saturation monitor (pulse oximeter) at home?" If Yes, ask "What is your reading (oxygen level) today?" "What is your usual oxygen saturation  reading?" (e.g., 95%)       No  Protocols used: Coronavirus (COVID-19) Diagnosed or Suspected-A-AH

## 2022-11-06 ENCOUNTER — Ambulatory Visit (INDEPENDENT_AMBULATORY_CARE_PROVIDER_SITE_OTHER): Payer: Medicare PPO | Admitting: Nurse Practitioner

## 2022-11-06 ENCOUNTER — Encounter: Payer: Self-pay | Admitting: Nurse Practitioner

## 2022-11-06 DIAGNOSIS — U071 COVID-19: Secondary | ICD-10-CM | POA: Insufficient documentation

## 2022-11-06 DIAGNOSIS — R052 Subacute cough: Secondary | ICD-10-CM

## 2022-11-06 NOTE — Progress Notes (Signed)
There were no vitals taken for this visit.   Subjective:    Patient ID: Alison Richardson, female    DOB: Jul 02, 1933, 88 y.o.   MRN: [id number]  HPI: Alison Richardson is a 88 y.o. female  Chief Complaint  Patient presents with   Covid Positive    Pt reports testing Positive for covid on Sunday. Symptoms onset since 10/30/2022. Pt c/o coughing, chest phlegm and feels lethargic.    UPPER RESPIRATORY TRACT INFECTION Worst symptom: Symptoms started last Tuesday.  Tested positive for COVID on Sunday Fever: no Cough: yes Shortness of breath: yes Wheezing: no Chest pain: no Chest tightness: no Chest congestion: yes Nasal congestion: yes Runny nose: yes Post nasal drip: yes Sneezing: no Sore throat: no Swollen glands: no Sinus pressure: no Headache: no Face pain: no Toothache: no Ear pain: no bilateral Ear pressure: no bilateral Eyes red/itching:no Eye drainage/crusting: no  Vomiting: no Rash: no Fatigue: yes Sick contacts: no Strep contacts: no  Context: stable Recurrent sinusitis: no Relief with OTC cold/cough medications: yes  Treatments attempted: mucinex and anti-histamine   Relevant past medical, surgical, family and social history reviewed and updated as indicated. Interim medical history since our last visit reviewed. Allergies and medications reviewed and updated.  Review of Systems  Constitutional:  Positive for fatigue. Negative for fever.  HENT:  Positive for congestion, postnasal drip and rhinorrhea. Negative for dental problem, ear pain, sinus pressure, sinus pain, sneezing and sore throat.   Respiratory:  Positive for cough and shortness of breath. Negative for wheezing.   Cardiovascular:  Negative for chest pain.  Gastrointestinal:  Negative for vomiting.  Skin:  Negative for rash.  Neurological:  Negative for headaches.    Per HPI unless specifically indicated above     Objective:    There were no vitals taken for this visit.  Wt Readings from  Last 3 Encounters:  10/15/22 171 lb 14.4 oz (78 kg)  09/25/22 170 lb (77.1 kg)  09/10/22 170 lb 1.6 oz (77.2 kg)    Physical Exam Vitals and nursing note reviewed.  Pulmonary:     Effort: Pulmonary effort is normal. No respiratory distress.  Neurological:     Mental Status: She is alert.  Psychiatric:        Mood and Affect: Mood normal.        Behavior: Behavior normal.        Thought Content: Thought content normal.        Judgment: Judgment normal.     Results for orders placed or performed in visit on 10/15/22  CBC with Differential/Platelet  Result Value Ref Range   WBC 10.1 3.4 - 10.8 x10E3/uL   RBC 4.40 3.77 - 5.28 x10E6/uL   Hemoglobin 12.3 11.1 - 15.9 g/dL   Hematocrit 37.5 34.0 - 46.6 %   MCV 85 79 - 97 fL   MCH 28.0 26.6 - 33.0 pg   MCHC 32.8 31.5 - 35.7 g/dL   RDW 14.5 11.7 - 15.4 %   Platelets 356 150 - 450 x10E3/uL   Neutrophils 60 Not Estab. %   Lymphs 28 Not Estab. %   Monocytes 8 Not Estab. %   Eos 3 Not Estab. %   Basos 1 Not Estab. %   Neutrophils Absolute 6.1 1.4 - 7.0 x10E3/uL   Lymphocytes Absolute 2.8 0.7 - 3.1 x10E3/uL   Monocytes Absolute 0.8 0.1 - 0.9 x10E3/uL   EOS (ABSOLUTE) 0.3 0.0 - 0.4 x10E3/uL   Basophils Absolute 0.1 0.0 -  0.2 x10E3/uL   Immature Granulocytes 0 Not Estab. %   Immature Grans (Abs) 0.0 0.0 - 0.1 x10E3/uL  Comprehensive metabolic panel  Result Value Ref Range   Glucose 151 (H) 70 - 99 mg/dL   BUN 15 8 - 27 mg/dL   Creatinine, Ser 0.95 0.57 - 1.00 mg/dL   eGFR 58 (L) >59 mL/min/1.73   BUN/Creatinine Ratio 16 12 - 28   Sodium 145 (H) 134 - 144 mmol/L   Potassium 3.5 3.5 - 5.2 mmol/L   Chloride 103 96 - 106 mmol/L   CO2 20 20 - 29 mmol/L   Calcium 9.4 8.7 - 10.3 mg/dL   Total Protein 7.2 6.0 - 8.5 g/dL   Albumin 4.5 3.7 - 4.7 g/dL   Globulin, Total 2.7 1.5 - 4.5 g/dL   Albumin/Globulin Ratio 1.7 1.2 - 2.2   Bilirubin Total 0.2 0.0 - 1.2 mg/dL   Alkaline Phosphatase 130 (H) 44 - 121 IU/L   AST 24 0 - 40 IU/L    ALT 23 0 - 32 IU/L  Lipid panel  Result Value Ref Range   Cholesterol, Total 171 100 - 199 mg/dL   Triglycerides 228 (H) 0 - 149 mg/dL   HDL 42 >39 mg/dL   VLDL Cholesterol Cal 38 5 - 40 mg/dL   LDL Chol Calc (NIH) 91 0 - 99 mg/dL   Chol/HDL Ratio 4.1 0.0 - 4.4 ratio  TSH  Result Value Ref Range   TSH 4.940 (H) 0.450 - 4.500 uIU/mL  Urinalysis, Routine w reflex microscopic  Result Value Ref Range   Specific Gravity, UA 1.015 1.005 - 1.030   pH, UA 6.0 5.0 - 7.5   Color, UA Yellow Yellow   Appearance Ur Clear Clear   Leukocytes,UA 1+ (A) Negative   Protein,UA Negative Negative/Trace   Glucose, UA Negative Negative   Ketones, UA Negative Negative   RBC, UA Negative Negative   Bilirubin, UA Negative Negative   Urobilinogen, Ur 0.2 0.2 - 1.0 mg/dL   Nitrite, UA Negative Negative  Urine Microalbumin w/creat. ratio  Result Value Ref Range   Creatinine, Urine 50.0 Not Estab. mg/dL   Microalbumin, Urine 10.0 Not Estab. ug/mL   Microalb/Creat Ratio <30 0 - 29 mg/g creat  HgB A1c  Result Value Ref Range   Hgb A1c MFr Bld 7.6 (H) 4.8 - 5.6 %   Est. average glucose Bld gHb Est-mCnc 171 mg/dL  T4, free  Result Value Ref Range   Free T4 1.80 (H) 0.82 - 1.77 ng/dL      Assessment & Plan:   Problem List Items Addressed This Visit       Other   COVID-19 - Primary Will obtain chest xray to evaluate for pneumonia. Inhaler is helping with cough. Continue with PRN use. Continue with OTC symptom management.  Will make recommendations based on imaging results.    Relevant Orders   DG Chest 2 View   Other Visit Diagnoses     Subacute cough       Xray ordered. WIll make recommendations based on imaging results.   Relevant Orders   DG Chest 2 View        Follow up plan: Return if symptoms worsen or fail to improve.   This visit was completed via MyChart due to the restrictions of the COVID-19 pandemic. All issues as above were discussed and addressed. Physical exam was done as  above through visual confirmation on MyChart. If it was felt that the patient should  be evaluated in the office, they were directed there. The patient verbally consented to this visit. Location of the patient: Home Location of the provider: Office Those involved with this call:  Provider: Jon Billings, NP CMA: Valinda Hoar, [place] Desk/Registration: Lynnell Catalan This encounter was conducted via phone.  I spent 21 dedicated to the care of this patient on the date of this encounter to include previsit review of symptoms, plan of care, and symptoms to monitor for, face to face time with the patient, and post visit ordering of testing.

## 2022-11-06 NOTE — Telephone Encounter (Signed)
Patient has bene changed to a telephone office visit per PCP.

## 2022-11-07 ENCOUNTER — Institutional Professional Consult (permissible substitution): Payer: Medicare PPO | Admitting: Student in an Organized Health Care Education/Training Program

## 2022-11-09 ENCOUNTER — Ambulatory Visit
Admission: RE | Admit: 2022-11-09 | Discharge: 2022-11-09 | Disposition: A | Discharge status: Home / Self Care | Payer: Medicare PPO | Attending: Nurse Practitioner | Admitting: Nurse Practitioner

## 2022-11-09 ENCOUNTER — Ambulatory Visit
Admission: RE | Admit: 2022-11-09 | Discharge: 2022-11-09 | Disposition: A | Discharge status: Home / Self Care | Payer: Medicare PPO | Source: Ambulatory Visit | Attending: Nurse Practitioner | Admitting: Nurse Practitioner

## 2022-11-09 DIAGNOSIS — R059 Cough, unspecified: Secondary | ICD-10-CM | POA: Diagnosis not present

## 2022-11-09 DIAGNOSIS — I700 Atherosclerosis of aorta: Secondary | ICD-10-CM | POA: Diagnosis not present

## 2022-11-09 DIAGNOSIS — R052 Subacute cough: Secondary | ICD-10-CM | POA: Insufficient documentation

## 2022-11-09 DIAGNOSIS — U071 COVID-19: Secondary | ICD-10-CM | POA: Insufficient documentation

## 2022-11-12 NOTE — Progress Notes (Signed)
Please let patient know that her chest xray did not show a pneumonia.

## 2022-11-16 ENCOUNTER — Other Ambulatory Visit: Payer: Self-pay | Admitting: Nurse Practitioner

## 2022-11-19 NOTE — Telephone Encounter (Signed)
Requested Prescriptions  Pending Prescriptions Disp Refills   [place] 1.25 MCG/ACT AERS [Pharmacy Med Name: SPIRIVA RESPIMAT 1.25 MCG/ACT INH A] 4 g 0    Sig: INHALE TWO PUFFS INTO THE LUNGS EVERY DAY     Pulmonology:  Anticholinergic Agents Passed - 11/16/2022 11:51 AM      Passed - Valid encounter within last 12 months    Recent Outpatient Visits           1 week ago [place], Karen, NP   1 month ago Annual physical exam   [facility] Jon Billings, NP   2 months ago Viral upper respiratory tract infection   [facility] Jon Billings, NP   4 months ago Hypertension associated with diabetes [facility])   [facility] Jon Billings, NP   7 months ago Chronic diastolic CHF (congestive heart failure) [facility])   [facility] Jon Billings, NP       Future Appointments             In 1 month Jon Billings, NP [facility], [place]

## 2022-11-26 ENCOUNTER — Ambulatory Visit: Payer: Self-pay

## 2022-11-26 NOTE — Telephone Encounter (Signed)
Pt called, unable to leave VM d/t line just kept ringing. No VM available.   Summary: medication list and changes   Pt wants to confirm changes made to her medication / pt needs to go over med list and see what she was suppose to stop or continue taking / please advise

## 2022-11-26 NOTE — Telephone Encounter (Signed)
2nd attempt, pt called, unable to leave VM. Will route to practice for review d/t pt possibly still in [facility] facility.

## 2022-11-28 ENCOUNTER — Encounter: Payer: Self-pay | Admitting: Student in an Organized Health Care Education/Training Program

## 2022-11-28 ENCOUNTER — Ambulatory Visit: Payer: Medicare PPO | Admitting: Student in an Organized Health Care Education/Training Program

## 2022-11-28 ENCOUNTER — Other Ambulatory Visit: Payer: Self-pay

## 2022-11-28 VITALS — BP 120/68 | HR 124 | Temp 97.8°F | Ht 66.0 in | Wt 169.0 lb

## 2022-11-28 DIAGNOSIS — R053 Chronic cough: Secondary | ICD-10-CM

## 2022-11-28 DIAGNOSIS — R0602 Shortness of breath: Secondary | ICD-10-CM

## 2022-11-28 MED ORDER — BUDESONIDE-FORMOTEROL FUMARATE 160-4.5 MCG/ACT IN AERO: 2.0000 | INHALATION_SPRAY | Freq: Two times a day (BID) | RESPIRATORY_TRACT | 2 refills | Status: DC

## 2022-11-28 MED ORDER — FLUTICASONE PROPIONATE 50 MCG/ACT NA SUSP: 1.0000 | Freq: Every day | NASAL | 2 refills | Status: DC

## 2022-11-28 MED ORDER — DESLORATADINE 5 MG PO TABS: 5.0000 mg | ORAL_TABLET | Freq: Every day | ORAL | 2 refills | Status: DC

## 2022-11-28 NOTE — Patient Instructions (Signed)
Start taking Symbicort (two puffs twice daily and as needed). Wash your mouth (gurgle and spit) after using it. Start taking Clarinex (or Claritin or Zyrtec) once a day Start using Flonase (one spray in each nostril) once daily Stop taking Lisinopril Stop taking Montelukast Stop using Spiriva

## 2022-11-28 NOTE — Telephone Encounter (Signed)
Called and spoke to patient. She is requesting to have a copy of her medication list mailed to her. Medication list printed and mailed to the patient as requested.

## 2022-11-28 NOTE — Progress Notes (Signed)
Synopsis: Referred in for chronic cough by Jon Billings, NP  Assessment & Plan:   1. Shortness of breath 2. Chronic cough  She is presenting for the evaluation of a chronic cough of a few months in duration that started early in the summer. The differential for this includes upper airway cough syndrome, ACEi associated cough, and post viral cough. I will go ahead and discontinue her lisinopril (not on her med list but there is a dispense report from November showing it to be filled). Exam of her nostrils is notable for erythema and congestion, and I will treat that with Flonase and a second generation anti-histamine. I will also obtain a PFT to look for any reactive airway disease/obstruction. In the interim, I will consolidate her inhalers to Symbicort twice daily and PRN. She will discontinue the spiriva and not use the albuterol unless necessary. I did counsel her on the fact the nature of UACS as well as post viral cough. Her lung exam is clear and I don't suspect amiodarone associated pulmonary toxicity - should the workup not reveal a cause and/or current investigation not help, we will consider obtaining a CT scan of the chest and broadening the workup.  - budesonide-formoterol (SYMBICORT) 160-4.5 MCG/ACT inhaler; Inhale 2 puffs into the lungs 2 (two) times daily.  Dispense: 1 each; Refill: 2 - desloratadine (CLARINEX) 5 MG tablet; Take 1 tablet (5 mg total) by mouth daily.  Dispense: 30 tablet; Refill: 2 - fluticasone (FLONASE) 50 MCG/ACT nasal spray; Place 1 spray into both nostrils daily.  Dispense: 18.2 mL; Refill: 2 - Pulmonary Function Test [facility] Only; Future   Return in about 2 months (around 01/29/2023).  I spent 60 minutes caring for this patient today, including preparing to see the patient, obtaining a medical history , reviewing a separately obtained history, performing a medically appropriate examination and/or evaluation, counseling and educating the  patient/family/caregiver, documenting clinical information in the electronic health record, and independently interpreting results (not separately reported/billed) and communicating results to the patient/family/caregiver  Armando Reichert, MD [facility] 11/28/2022 10:03 AM    End of visit medications:  Meds ordered this encounter  Medications   budesonide-formoterol (SYMBICORT) 160-4.5 MCG/ACT inhaler    Sig: Inhale 2 puffs into the lungs 2 (two) times daily.    Dispense:  1 each    Refill:  2   desloratadine (CLARINEX) 5 MG tablet    Sig: Take 1 tablet (5 mg total) by mouth daily.    Dispense:  30 tablet    Refill:  2   fluticasone (FLONASE) 50 MCG/ACT nasal spray    Sig: Place 1 spray into both nostrils daily.    Dispense:  18.2 mL    Refill:  2     Current Outpatient Medications:    albuterol (VENTOLIN HFA) 108 (90 Base) MCG/ACT inhaler, INHALE 2 PUFFS INTO THE LUNGS EVERY 6 HOURS AS NEEDED FOR WHEEZING OR SHORTNESS OF BREATH, Disp: 8.5 g, Rfl: 2   amiodarone (PACERONE) 200 MG tablet, Take 200 mg by mouth daily. , Disp: , Rfl:    budesonide-formoterol (SYMBICORT) 160-4.5 MCG/ACT inhaler, Inhale 2 puffs into the lungs 2 (two) times daily., Disp: 1 each, Rfl: 2   clopidogrel (PLAVIX) 75 MG tablet, Take 75 mg by mouth daily., Disp: , Rfl:    desloratadine (CLARINEX) 5 MG tablet, Take 1 tablet (5 mg total) by mouth daily., Disp: 30 tablet, Rfl: 2   donepezil (ARICEPT) 10 MG tablet, TAKE ONE TABLET (10  MG) BY MOUTH AT BEDTIME, Disp: 90 tablet, Rfl: 1   ELIQUIS 5 MG TABS tablet, Take 5 mg by mouth 2 (two) times daily. , Disp: , Rfl:    famotidine (PEPCID) 20 MG tablet, TAKE 1 TABLET BY MOUTH ONCE DAILY, Disp: 90 tablet, Rfl: 1   fluticasone (FLONASE) 50 MCG/ACT nasal spray, Place 1 spray into both nostrils daily., Disp: 18.2 mL, Rfl: 2   gabapentin (NEURONTIN) 100 MG capsule, Take 100 mg by mouth daily., Disp: , Rfl:    glipiZIDE (GLUCOTROL XL) 2.5 MG 24 hr tablet,  TAKE ONE TABLET BY MOUTH ONCE DAILY, Disp: 90 tablet, Rfl: 1   isosorbide mononitrate (IMDUR) 30 MG 24 hr tablet, Take 30 mg by mouth daily., Disp: , Rfl:    levothyroxine (SYNTHROID) 88 MCG tablet, Take 1 tablet (88 mcg total) by mouth daily., Disp: 90 tablet, Rfl: 1   metoprolol succinate (TOPROL-XL) 100 MG 24 hr tablet, TAKE ONE TABLET (100 MG) BY MOUTH EVERY DAY (TAKE WITH OR IMMEDIATELY AFTER A MEAL), Disp: 90 tablet, Rfl: 0   nitroGLYCERIN (NITROSTAT) 0.4 MG SL tablet, Place 0.4 mg under the tongue every 5 (five) minutes as needed for chest pain., Disp: , Rfl:    omeprazole (PRILOSEC) 20 MG capsule, Take 1 capsule (20 mg total) by mouth 2 (two) times daily before a meal., Disp: 180 capsule, Rfl: 1   oxyCODONE (OXY IR/ROXICODONE) 5 MG immediate release tablet, Take 1 tablet (5 mg total) by mouth every 4 (four) hours as needed for severe pain or breakthrough pain., Disp: 30 tablet, Rfl: 0   pravastatin (PRAVACHOL) 20 MG tablet, Take 20 mg by mouth at bedtime., Disp: , Rfl:    torsemide (DEMADEX) 20 MG tablet, Take 1 tablet (20 mg total) by mouth daily., Disp: 30 tablet, Rfl: 0   venlafaxine XR (EFFEXOR-XR) 75 MG 24 hr capsule, TAKE 1 CAPSULE BY MOUTH ONCE DAILY., Disp: 90 capsule, Rfl: 1   Subjective:   PATIENT ID: Alison Richardson GENDER: female DOB: Apr 23, 1933, MRN: [id number]  Chief Complaint  Patient presents with   PULMONARY CONSULT    Covid 3 weeks ago. C/o prod cough with white sputum, wheezing and SOB  with exertion.     HPI  Ms. Chagnon is a pleasant 88 year old female presenting to clinic for the evaluation of cough.  She reports that the cough started early in the summer, and has been persistent. She did not notice any associations around the time this started, but review of her medical record and dispense history coincides with the initiation of Lisinopril around that time. The cough was dry throughout the summer and was persistent. She then developed COVID-19 around 3 weeks ago  after which the cough worsened. It is now episodic and happens day and night. The cough sometimes wakes her up from sleep. It is associated with chest tightness and wheezing. She was given Spiriva and albuterol by her primary care providers with little help.  She uses albuterol around 4 times a day.  She has a runny nose continuously and feels a sensation of drainage in the back of her throat.  The coughs is sometimes productive of clear sputum. The nasal drainage is persistent and always clear.  No fevers or chills reported.  She is a lifelong non-smoker and denies any significant exposures. Her past medical history is significant for CAD, HFpEF, HTN, DM, and Afib. She is maintained on amiodarone, diltiazem, eliquis, clopidogrel, levothyroxine, metoprolol, torsemide, and montelukast.  Ancillary information including prior  medications, full medical/surgical/family/social histories, and PFTs (when available) are listed below and have been reviewed.   Review of Systems  Constitutional:  Negative for chills, fever and weight loss.  Respiratory:  Positive for cough, sputum production, shortness of breath and wheezing.   Cardiovascular:  Negative for chest pain, leg swelling and PND.     Objective:   Vitals:   11/28/22 0911  BP: 120/68  Pulse: (!) 124  Temp: 97.8 F (36.6 C)  TempSrc: Temporal  SpO2: 96%  Weight: 169 lb (76.7 kg)  Height: '5\' 6"'$  (1.676 m)   96% on RA  BMI Readings from Last 3 Encounters:  11/28/22 27.28 kg/m  10/15/22 27.75 kg/m  09/25/22 27.44 kg/m   Wt Readings from Last 3 Encounters:  11/28/22 169 lb (76.7 kg)  10/15/22 171 lb 14.4 oz (78 kg)  09/25/22 170 lb (77.1 kg)    Physical Exam Constitutional:      Appearance: Normal appearance. She is normal weight. She is not ill-appearing.  HENT:     Head: Normocephalic.     Nose: Congestion and rhinorrhea present.  Eyes:     Extraocular Movements: Extraocular movements intact.     Pupils: Pupils are equal,  round, and reactive to light.  Cardiovascular:     Rate and Rhythm: Normal rate and regular rhythm.     Pulses: Normal pulses.     Heart sounds: Normal heart sounds.  Pulmonary:     Effort: Pulmonary effort is normal.     Breath sounds: Normal breath sounds. No wheezing, rhonchi or rales.  Abdominal:     Palpations: Abdomen is soft.  Skin:    General: Skin is warm.  Neurological:     General: No focal deficit present.     Mental Status: She is alert and oriented to person, place, and time. Mental status is at baseline.     Ancillary Information    Past Medical History:  Diagnosis Date   Arrhythmia    Atrial flutter ([place]) 01/2018   new onset    Basal cell carcinoma of back    Basal cell carcinoma of lip    Cerebral aneurysm    followed by [facility]   CHF (congestive heart failure) (HCC)    DDD (degenerative disc disease), lumbar    superior plate depression, L3 08/18/2014   Diabetes mellitus type II, controlled ([place])    Diverticulosis    Dysrhythmia    Paroxysmal Supraventricular Tachycardia   Dysthymia    depression   GERD (gastroesophageal reflux disease)    History of meniscal tear    Hyperlipidemia    Hypertension    Hypothyroidism    Late onset Alzheimer's disease with behavioral disturbance (HCC)    Mild pulmonary hypertension (HCC)    Overactive bladder    Peripheral vascular disease (HCC)    Seasonal allergic rhinitis    Sleep apnea      Family History  Problem Relation Age of Onset   Hypertension Mother    Heart disease Father    CAD Father    Heart disease Sister    Diabetes Sister    Diabetes Paternal Uncle    Sleep apnea Son      Past Surgical History:  Procedure Laterality Date   APPENDECTOMY  1946   BASAL CELL CARCINOMA EXCISION     2006 and 2009 removed from back and lip   CARDIOVASCULAR STRESS TEST  2015   nuclear cardiac stress test negative for ischemia - Dr. Satira Sark  CATARACT EXTRACTION, BILATERAL  2006   COLONOSCOPY  2014    COLONOSCOPY N/A 08/19/2020   Procedure: COLONOSCOPY;  Surgeon: Lesly Rubenstein, MD;  Location: [facility] ENDOSCOPY;  Service: Endoscopy;  Laterality: N/A;   ECTOPIC PREGNANCY SURGERY  1957   ESOPHAGOGASTRODUODENOSCOPY N/A 08/19/2020   Procedure: ESOPHAGOGASTRODUODENOSCOPY (EGD);  Surgeon: Lesly Rubenstein, MD;  Location: [facility] ENDOSCOPY;  Service: Endoscopy;  Laterality: N/A;   INJECTION KNEE Left 05/2015   LEFT HEART CATH AND CORONARY ANGIOGRAPHY N/A 10/12/2021   Procedure: LEFT HEART CATH AND CORONARY ANGIOGRAPHY;  Surgeon: Corey Skains, MD;  Location: [place] CV LAB;  Service: Cardiovascular;  Laterality: N/A;   REPLACEMENT TOTAL KNEE Left 09/06/2016   Dr. Barnet Pall   TONSILLECTOMY AND ADENOIDECTOMY  1953   TOTAL ABDOMINAL HYSTERECTOMY  1986   [facility]    Social History   Socioeconomic History   Marital status: Widowed    Spouse name: Not on file   Number of children: 5   Years of education: Not on file   Highest education level: Not on file  Occupational History   Not on file  Tobacco Use   Smoking status: Never   Smokeless tobacco: Never  Vaping Use   Vaping Use: Never used  Substance and Sexual Activity   Alcohol use: Yes    Alcohol/week: 3.0 standard drinks of alcohol    Types: 3 Shots of liquor per week    Comment: 3 x week    Drug use: Never   Sexual activity: Not Currently  Other Topics Concern   Not on file  Social History Narrative   Not on file   Social Determinants of Health   Financial Resource Strain: Low Risk  (03/20/2022)   Overall Financial Resource Strain (CARDIA)    Difficulty of Paying Living Expenses: Not hard at all  Food Insecurity: No Food Insecurity (03/20/2022)   Hunger Vital Sign    Worried About Running Out of Food in the Last Year: Never true    Ran Out of Food in the Last Year: Never true  Transportation Needs: No Transportation Needs (03/20/2022)   PRAPARE - Hydrologist (Medical): No    Lack of  Transportation (Non-Medical): No  Physical Activity: Insufficiently Active (03/20/2022)   Exercise Vital Sign    Days of Exercise per Week: 2 days    Minutes of Exercise per Session: 30 min  Stress: No Stress Concern Present (03/20/2022)   [place]    Feeling of Stress : Not at all  Social Connections: Moderately Integrated (03/20/2022)   Social Connection and Isolation Panel [NHANES]    Frequency of Communication with Friends and Family: More than three times a week    Frequency of Social Gatherings with Friends and Family: More than three times a week    Attends Religious Services: More than 4 times per year    Active Member of [employer] or Organizations: Yes    Attends Archivist Meetings: More than 4 times per year    Marital Status: Widowed  Intimate Partner Violence: Not At Risk (03/20/2022)   Humiliation, Afraid, Rape, and Kick questionnaire    Fear of Current or Ex-Partner: No    Emotionally Abused: No    Physically Abused: No    Sexually Abused: No     Allergies  Allergen Reactions   Pantoprazole Nausea And Vomiting   Metformin And Related Diarrhea     CBC  Component Value Date/Time   WBC 10.1 10/15/2022 1436   WBC 14.2 (H) 05/16/2022 0510   RBC 4.40 10/15/2022 1436   RBC 4.30 05/16/2022 0510   HGB 12.3 10/15/2022 1436   HCT 37.5 10/15/2022 1436   PLT 356 10/15/2022 1436   MCV 85 10/15/2022 1436   MCH 28.0 10/15/2022 1436   MCH 28.6 05/16/2022 0510   MCHC 32.8 10/15/2022 1436   MCHC 32.2 05/16/2022 0510   RDW 14.5 10/15/2022 1436   LYMPHSABS 2.8 10/15/2022 1436   MONOABS 0.8 05/15/2022 2103   EOSABS 0.3 10/15/2022 1436   BASOSABS 0.1 10/15/2022 1436    Pulmonary Functions Testing Results:     No data to display          Outpatient Medications Prior to Visit  Medication Sig Dispense Refill   albuterol (VENTOLIN HFA) 108 (90 Base) MCG/ACT inhaler INHALE 2 PUFFS INTO THE LUNGS  EVERY 6 HOURS AS NEEDED FOR WHEEZING OR SHORTNESS OF BREATH 8.5 g 2   amiodarone (PACERONE) 200 MG tablet Take 200 mg by mouth daily.      clopidogrel (PLAVIX) 75 MG tablet Take 75 mg by mouth daily.     donepezil (ARICEPT) 10 MG tablet TAKE ONE TABLET (10 MG) BY MOUTH AT BEDTIME 90 tablet 1   ELIQUIS 5 MG TABS tablet Take 5 mg by mouth 2 (two) times daily.      famotidine (PEPCID) 20 MG tablet TAKE 1 TABLET BY MOUTH ONCE DAILY 90 tablet 1   gabapentin (NEURONTIN) 100 MG capsule Take 100 mg by mouth daily.     glipiZIDE (GLUCOTROL XL) 2.5 MG 24 hr tablet TAKE ONE TABLET BY MOUTH ONCE DAILY 90 tablet 1   isosorbide mononitrate (IMDUR) 30 MG 24 hr tablet Take 30 mg by mouth daily.     levothyroxine (SYNTHROID) 88 MCG tablet Take 1 tablet (88 mcg total) by mouth daily. 90 tablet 1   metoprolol succinate (TOPROL-XL) 100 MG 24 hr tablet TAKE ONE TABLET (100 MG) BY MOUTH EVERY DAY (TAKE WITH OR IMMEDIATELY AFTER A MEAL) 90 tablet 0   nitroGLYCERIN (NITROSTAT) 0.4 MG SL tablet Place 0.4 mg under the tongue every 5 (five) minutes as needed for chest pain.     omeprazole (PRILOSEC) 20 MG capsule Take 1 capsule (20 mg total) by mouth 2 (two) times daily before a meal. 180 capsule 1   oxyCODONE (OXY IR/ROXICODONE) 5 MG immediate release tablet Take 1 tablet (5 mg total) by mouth every 4 (four) hours as needed for severe pain or breakthrough pain. 30 tablet 0   pravastatin (PRAVACHOL) 20 MG tablet Take 20 mg by mouth at bedtime.     torsemide (DEMADEX) 20 MG tablet Take 1 tablet (20 mg total) by mouth daily. 30 tablet 0   venlafaxine XR (EFFEXOR-XR) 75 MG 24 hr capsule TAKE 1 CAPSULE BY MOUTH ONCE DAILY. 90 capsule 1   montelukast (SINGULAIR) 10 MG tablet Take 1 tablet (10 mg total) by mouth at bedtime. 90 tablet 0   Tiotropium Bromide Monohydrate (SPIRIVA RESPIMAT) 1.25 MCG/ACT AERS INHALE TWO PUFFS INTO THE LUNGS EVERY DAY 4 g 0   No facility-administered medications prior to visit.

## 2022-12-09 ENCOUNTER — Inpatient Hospital Stay
Admission: EM | Admit: 2022-12-09 | Discharge: 2022-12-12 | DRG: 193 | Disposition: A | Discharge status: Home Health | Payer: Medicare PPO | Attending: Internal Medicine | Admitting: Internal Medicine

## 2022-12-09 ENCOUNTER — Emergency Department: Payer: Medicare PPO

## 2022-12-09 DIAGNOSIS — Z7902 Long term (current) use of antithrombotics/antiplatelets: Secondary | ICD-10-CM

## 2022-12-09 DIAGNOSIS — Z8249 Family history of ischemic heart disease and other diseases of the circulatory system: Secondary | ICD-10-CM

## 2022-12-09 DIAGNOSIS — F32A Depression, unspecified: Secondary | ICD-10-CM | POA: Diagnosis present

## 2022-12-09 DIAGNOSIS — I509 Heart failure, unspecified: Secondary | ICD-10-CM

## 2022-12-09 DIAGNOSIS — F0283 Dementia in other diseases classified elsewhere, unspecified severity, with mood disturbance: Secondary | ICD-10-CM | POA: Diagnosis present

## 2022-12-09 DIAGNOSIS — E039 Hypothyroidism, unspecified: Secondary | ICD-10-CM | POA: Diagnosis present

## 2022-12-09 DIAGNOSIS — R0603 Acute respiratory distress: Secondary | ICD-10-CM | POA: Diagnosis not present

## 2022-12-09 DIAGNOSIS — Z85828 Personal history of other malignant neoplasm of skin: Secondary | ICD-10-CM

## 2022-12-09 DIAGNOSIS — R6520 Severe sepsis without septic shock: Secondary | ICD-10-CM | POA: Diagnosis not present

## 2022-12-09 DIAGNOSIS — E872 Acidosis, unspecified: Secondary | ICD-10-CM | POA: Diagnosis present

## 2022-12-09 DIAGNOSIS — Z79899 Other long term (current) drug therapy: Secondary | ICD-10-CM

## 2022-12-09 DIAGNOSIS — F0280 Dementia in other diseases classified elsewhere without behavioral disturbance: Secondary | ICD-10-CM

## 2022-12-09 DIAGNOSIS — I480 Paroxysmal atrial fibrillation: Secondary | ICD-10-CM | POA: Diagnosis present

## 2022-12-09 DIAGNOSIS — I499 Cardiac arrhythmia, unspecified: Secondary | ICD-10-CM | POA: Diagnosis not present

## 2022-12-09 DIAGNOSIS — G4733 Obstructive sleep apnea (adult) (pediatric): Secondary | ICD-10-CM | POA: Diagnosis present

## 2022-12-09 DIAGNOSIS — G309 Alzheimer's disease, unspecified: Secondary | ICD-10-CM | POA: Diagnosis not present

## 2022-12-09 DIAGNOSIS — I517 Cardiomegaly: Secondary | ICD-10-CM | POA: Diagnosis not present

## 2022-12-09 DIAGNOSIS — U099 Post covid-19 condition, unspecified: Secondary | ICD-10-CM | POA: Diagnosis present

## 2022-12-09 DIAGNOSIS — I252 Old myocardial infarction: Secondary | ICD-10-CM | POA: Diagnosis not present

## 2022-12-09 DIAGNOSIS — Z7901 Long term (current) use of anticoagulants: Secondary | ICD-10-CM

## 2022-12-09 DIAGNOSIS — I110 Hypertensive heart disease with heart failure: Secondary | ICD-10-CM | POA: Diagnosis not present

## 2022-12-09 DIAGNOSIS — G301 Alzheimer's disease with late onset: Secondary | ICD-10-CM | POA: Diagnosis present

## 2022-12-09 DIAGNOSIS — I482 Chronic atrial fibrillation, unspecified: Secondary | ICD-10-CM

## 2022-12-09 DIAGNOSIS — I272 Pulmonary hypertension, unspecified: Secondary | ICD-10-CM | POA: Diagnosis present

## 2022-12-09 DIAGNOSIS — R0689 Other abnormalities of breathing: Secondary | ICD-10-CM | POA: Diagnosis not present

## 2022-12-09 DIAGNOSIS — E1142 Type 2 diabetes mellitus with diabetic polyneuropathy: Secondary | ICD-10-CM

## 2022-12-09 DIAGNOSIS — F418 Other specified anxiety disorders: Secondary | ICD-10-CM

## 2022-12-09 DIAGNOSIS — E1165 Type 2 diabetes mellitus with hyperglycemia: Secondary | ICD-10-CM | POA: Diagnosis present

## 2022-12-09 DIAGNOSIS — R918 Other nonspecific abnormal finding of lung field: Secondary | ICD-10-CM | POA: Diagnosis not present

## 2022-12-09 DIAGNOSIS — R069 Unspecified abnormalities of breathing: Secondary | ICD-10-CM | POA: Diagnosis not present

## 2022-12-09 DIAGNOSIS — Z743 Need for continuous supervision: Secondary | ICD-10-CM | POA: Diagnosis not present

## 2022-12-09 DIAGNOSIS — I255 Ischemic cardiomyopathy: Secondary | ICD-10-CM | POA: Diagnosis not present

## 2022-12-09 DIAGNOSIS — Z7951 Long term (current) use of inhaled steroids: Secondary | ICD-10-CM

## 2022-12-09 DIAGNOSIS — J849 Interstitial pulmonary disease, unspecified: Secondary | ICD-10-CM | POA: Diagnosis not present

## 2022-12-09 DIAGNOSIS — R0902 Hypoxemia: Secondary | ICD-10-CM

## 2022-12-09 DIAGNOSIS — I5033 Acute on chronic diastolic (congestive) heart failure: Secondary | ICD-10-CM | POA: Diagnosis not present

## 2022-12-09 DIAGNOSIS — K219 Gastro-esophageal reflux disease without esophagitis: Secondary | ICD-10-CM | POA: Diagnosis present

## 2022-12-09 DIAGNOSIS — Z79890 Hormone replacement therapy (postmenopausal): Secondary | ICD-10-CM

## 2022-12-09 DIAGNOSIS — J189 Pneumonia, unspecified organism: Secondary | ICD-10-CM | POA: Diagnosis not present

## 2022-12-09 DIAGNOSIS — Z1152 Encounter for screening for COVID-19: Secondary | ICD-10-CM | POA: Diagnosis not present

## 2022-12-09 DIAGNOSIS — I5032 Chronic diastolic (congestive) heart failure: Secondary | ICD-10-CM

## 2022-12-09 DIAGNOSIS — N3281 Overactive bladder: Secondary | ICD-10-CM | POA: Diagnosis present

## 2022-12-09 DIAGNOSIS — I700 Atherosclerosis of aorta: Secondary | ICD-10-CM | POA: Diagnosis not present

## 2022-12-09 DIAGNOSIS — J45901 Unspecified asthma with (acute) exacerbation: Secondary | ICD-10-CM | POA: Diagnosis not present

## 2022-12-09 DIAGNOSIS — F331 Major depressive disorder, recurrent, moderate: Secondary | ICD-10-CM | POA: Diagnosis present

## 2022-12-09 DIAGNOSIS — E1151 Type 2 diabetes mellitus with diabetic peripheral angiopathy without gangrene: Secondary | ICD-10-CM | POA: Diagnosis present

## 2022-12-09 DIAGNOSIS — T380X5A Adverse effect of glucocorticoids and synthetic analogues, initial encounter: Secondary | ICD-10-CM | POA: Diagnosis present

## 2022-12-09 DIAGNOSIS — J9601 Acute respiratory failure with hypoxia: Secondary | ICD-10-CM | POA: Diagnosis present

## 2022-12-09 DIAGNOSIS — F0284 Dementia in other diseases classified elsewhere, unspecified severity, with anxiety: Secondary | ICD-10-CM | POA: Diagnosis present

## 2022-12-09 DIAGNOSIS — I2510 Atherosclerotic heart disease of native coronary artery without angina pectoris: Secondary | ICD-10-CM | POA: Diagnosis present

## 2022-12-09 DIAGNOSIS — Z7984 Long term (current) use of oral hypoglycemic drugs: Secondary | ICD-10-CM

## 2022-12-09 DIAGNOSIS — E876 Hypokalemia: Secondary | ICD-10-CM | POA: Diagnosis not present

## 2022-12-09 DIAGNOSIS — J40 Bronchitis, not specified as acute or chronic: Secondary | ICD-10-CM | POA: Diagnosis present

## 2022-12-09 DIAGNOSIS — J45909 Unspecified asthma, uncomplicated: Secondary | ICD-10-CM

## 2022-12-09 DIAGNOSIS — Z833 Family history of diabetes mellitus: Secondary | ICD-10-CM

## 2022-12-09 DIAGNOSIS — R6889 Other general symptoms and signs: Secondary | ICD-10-CM | POA: Diagnosis not present

## 2022-12-09 DIAGNOSIS — J9811 Atelectasis: Secondary | ICD-10-CM | POA: Diagnosis not present

## 2022-12-09 DIAGNOSIS — Z888 Allergy status to other drugs, medicaments and biological substances status: Secondary | ICD-10-CM

## 2022-12-09 DIAGNOSIS — E785 Hyperlipidemia, unspecified: Secondary | ICD-10-CM | POA: Diagnosis not present

## 2022-12-09 DIAGNOSIS — Z66 Do not resuscitate: Secondary | ICD-10-CM | POA: Diagnosis not present

## 2022-12-09 DIAGNOSIS — Z96652 Presence of left artificial knee joint: Secondary | ICD-10-CM | POA: Diagnosis present

## 2022-12-09 DIAGNOSIS — A419 Sepsis, unspecified organism: Secondary | ICD-10-CM | POA: Diagnosis not present

## 2022-12-09 DIAGNOSIS — J90 Pleural effusion, not elsewhere classified: Secondary | ICD-10-CM | POA: Diagnosis not present

## 2022-12-09 DIAGNOSIS — K449 Diaphragmatic hernia without obstruction or gangrene: Secondary | ICD-10-CM | POA: Diagnosis not present

## 2022-12-09 HISTORY — DX: Pneumonia, unspecified organism: J18.9

## 2022-12-09 LAB — CBC WITH DIFFERENTIAL/PLATELET
Abs Immature Granulocytes: 0.1 10*3/uL — ABNORMAL HIGH (ref 0.00–0.07)
Basophils Absolute: 0.1 10*3/uL (ref 0.0–0.1)
Basophils Relative: 1 %
Eosinophils Absolute: 0.2 10*3/uL (ref 0.0–0.5)
Eosinophils Relative: 1 %
HCT: 41.1 % (ref 36.0–46.0)
Hemoglobin: 12.9 g/dL (ref 12.0–15.0)
Immature Granulocytes: 1 %
Lymphocytes Relative: 12 %
Lymphs Abs: 2 10*3/uL (ref 0.7–4.0)
MCH: 27.2 pg (ref 26.0–34.0)
MCHC: 31.4 g/dL (ref 30.0–36.0)
MCV: 86.5 fL (ref 80.0–100.0)
Monocytes Absolute: 0.9 10*3/uL (ref 0.1–1.0)
Monocytes Relative: 6 %
Neutro Abs: 12.8 10*3/uL — ABNORMAL HIGH (ref 1.7–7.7)
Neutrophils Relative %: 79 %
Platelets: 306 10*3/uL (ref 150–400)
RBC: 4.75 MIL/uL (ref 3.87–5.11)
RDW: 14.8 % (ref 11.5–15.5)
WBC: 16.1 10*3/uL — ABNORMAL HIGH (ref 4.0–10.5)
nRBC: 0 % (ref 0.0–0.2)

## 2022-12-09 LAB — CBG MONITORING, ED
Glucose-Capillary: 271 mg/dL — ABNORMAL HIGH (ref 70–99)
Glucose-Capillary: 301 mg/dL — ABNORMAL HIGH (ref 70–99)
Glucose-Capillary: 224 mg/dL — ABNORMAL HIGH (ref 70–99)
Glucose-Capillary: 248 mg/dL — ABNORMAL HIGH (ref 70–99)

## 2022-12-09 LAB — COMPREHENSIVE METABOLIC PANEL
ALT: 55 U/L — ABNORMAL HIGH (ref 0–44)
AST: 63 U/L — ABNORMAL HIGH (ref 15–41)
Albumin: 4.3 g/dL (ref 3.5–5.0)
Alkaline Phosphatase: 113 U/L (ref 38–126)
Anion gap: 8 (ref 5–15)
BUN: 14 mg/dL (ref 8–23)
CO2: 24 mmol/L (ref 22–32)
Calcium: 9 mg/dL (ref 8.9–10.3)
Chloride: 109 mmol/L (ref 98–111)
Creatinine, Ser: 0.65 mg/dL (ref 0.44–1.00)
GFR, Estimated: >60 mL/min (ref >60)
Glucose, Bld: 210 mg/dL — ABNORMAL HIGH (ref 70–99)
Potassium: 3.1 mmol/L — ABNORMAL LOW (ref 3.5–5.1)
Sodium: 141 mmol/L (ref 135–145)
Total Bilirubin: 1.1 mg/dL (ref 0.3–1.2)
Total Protein: 7.8 g/dL (ref 6.5–8.1)

## 2022-12-09 LAB — BRAIN NATRIURETIC PEPTIDE: B Natriuretic Peptide: 407.6 pg/mL — ABNORMAL HIGH (ref 0.0–100.0)

## 2022-12-09 LAB — RESP PANEL BY RT-PCR (RSV, FLU A&B, COVID)  RVPGX2
Influenza A by PCR: NEGATIVE
Influenza B by PCR: NEGATIVE
Resp Syncytial Virus by PCR: NEGATIVE
SARS Coronavirus 2 by RT PCR: NEGATIVE

## 2022-12-09 LAB — MAGNESIUM: Magnesium: 2.2 mg/dL (ref 1.7–2.4)

## 2022-12-09 LAB — TROPONIN I (HIGH SENSITIVITY)
Troponin I (High Sensitivity): 8 ng/L (ref <18)
Troponin I (High Sensitivity): 9 ng/L (ref <18)

## 2022-12-09 LAB — LACTIC ACID, PLASMA
Lactic Acid, Venous: 2.9 mmol/L (ref 0.5–1.9)
Lactic Acid, Venous: 5.2 mmol/L (ref 0.5–1.9)

## 2022-12-09 LAB — PROCALCITONIN: Procalcitonin: <0.1 ng/mL

## 2022-12-09 MED ORDER — IPRATROPIUM-ALBUTEROL 0.5-2.5 (3) MG/3ML IN SOLN
3.0000 mL | RESPIRATORY_TRACT | Status: DC
  Administered 2022-12-09 – 2022-12-10 (×10): 3 mL via RESPIRATORY_TRACT
  Filled 2022-12-09 (×10): qty 3

## 2022-12-09 MED ORDER — OXYCODONE-ACETAMINOPHEN 5-325 MG PO TABS: 1.0000 | ORAL_TABLET | Freq: Four times a day (QID) | ORAL | Status: DC | PRN

## 2022-12-09 MED ORDER — DM-GUAIFENESIN ER 30-600 MG PO TB12
1.0000 | ORAL_TABLET | Freq: Two times a day (BID) | ORAL | Status: DC | PRN
  Administered 2022-12-10: 1 via ORAL
  Filled 2022-12-09: qty 1

## 2022-12-09 MED ORDER — SODIUM CHLORIDE 0.9 % IV SOLN
1.0000 g | INTRAVENOUS | Status: DC
  Administered 2022-12-10: 1 g via INTRAVENOUS
  Filled 2022-12-09: qty 10

## 2022-12-09 MED ORDER — HYDRALAZINE HCL 20 MG/ML IJ SOLN: 5.0000 mg | INTRAMUSCULAR | Status: DC | PRN

## 2022-12-09 MED ORDER — MOMETASONE FURO-FORMOTEROL FUM 200-5 MCG/ACT IN AERO
2.0000 | INHALATION_SPRAY | Freq: Two times a day (BID) | RESPIRATORY_TRACT | Status: DC
  Administered 2022-12-11 – 2022-12-12 (×3): 2 via RESPIRATORY_TRACT
  Filled 2022-12-09: qty 8.8

## 2022-12-09 MED ORDER — LEVOTHYROXINE SODIUM 88 MCG PO TABS
88.0000 ug | ORAL_TABLET | Freq: Every day | ORAL | Status: DC
  Administered 2022-12-10 – 2022-12-12 (×3): 88 ug via ORAL
  Filled 2022-12-09 (×4): qty 1

## 2022-12-09 MED ORDER — SODIUM CHLORIDE 0.9 % IV SOLN: 2.0000 g | INTRAVENOUS | Status: DC

## 2022-12-09 MED ORDER — HYDROCOD POLI-CHLORPHE POLI ER 10-8 MG/5ML PO SUER
5.0000 mL | Freq: Two times a day (BID) | ORAL | Status: DC | PRN
  Administered 2022-12-09 – 2022-12-11 (×4): 5 mL via ORAL
  Filled 2022-12-09 (×4): qty 5

## 2022-12-09 MED ORDER — PANTOPRAZOLE SODIUM 40 MG PO TBEC
40.0000 mg | DELAYED_RELEASE_TABLET | Freq: Every day | ORAL | Status: DC
  Administered 2022-12-09 – 2022-12-12 (×4): 40 mg via ORAL
  Filled 2022-12-09 (×4): qty 1

## 2022-12-09 MED ORDER — APIXABAN 5 MG PO TABS
5.0000 mg | ORAL_TABLET | Freq: Two times a day (BID) | ORAL | Status: DC
  Administered 2022-12-09 – 2022-12-12 (×7): 5 mg via ORAL
  Filled 2022-12-09 (×8): qty 1

## 2022-12-09 MED ORDER — ISOSORBIDE MONONITRATE ER 30 MG PO TB24
30.0000 mg | ORAL_TABLET | Freq: Every day | ORAL | Status: DC
  Administered 2022-12-09 – 2022-12-12 (×4): 30 mg via ORAL
  Filled 2022-12-09 (×4): qty 1

## 2022-12-09 MED ORDER — VENLAFAXINE HCL ER 75 MG PO CP24
75.0000 mg | ORAL_CAPSULE | Freq: Every day | ORAL | Status: DC
  Administered 2022-12-09 – 2022-12-12 (×4): 75 mg via ORAL
  Filled 2022-12-09 (×5): qty 1

## 2022-12-09 MED ORDER — NITROGLYCERIN 0.4 MG SL SUBL: 0.4000 mg | SUBLINGUAL_TABLET | SUBLINGUAL | Status: DC | PRN

## 2022-12-09 MED ORDER — LORATADINE 10 MG PO TABS
10.0000 mg | ORAL_TABLET | Freq: Every day | ORAL | Status: DC
  Administered 2022-12-09 – 2022-12-12 (×4): 10 mg via ORAL
  Filled 2022-12-09 (×4): qty 1

## 2022-12-09 MED ORDER — DILTIAZEM HCL ER COATED BEADS 240 MG PO CP24
240.0000 mg | ORAL_CAPSULE | Freq: Every day | ORAL | Status: DC
  Administered 2022-12-09 – 2022-12-12 (×4): 240 mg via ORAL
  Filled 2022-12-09 (×5): qty 1

## 2022-12-09 MED ORDER — ACETAMINOPHEN 325 MG PO TABS: 650.0000 mg | ORAL_TABLET | Freq: Four times a day (QID) | ORAL | Status: DC | PRN

## 2022-12-09 MED ORDER — BUMETANIDE 0.25 MG/ML IJ SOLN
1.0000 mg | Freq: Once | INTRAMUSCULAR | Status: AC
  Administered 2022-12-09: 1 mg via INTRAVENOUS
  Filled 2022-12-09: qty 4

## 2022-12-09 MED ORDER — ALBUTEROL SULFATE (2.5 MG/3ML) 0.083% IN NEBU: 2.5000 mg | INHALATION_SOLUTION | RESPIRATORY_TRACT | Status: DC | PRN

## 2022-12-09 MED ORDER — METOPROLOL SUCCINATE ER 50 MG PO TB24
100.0000 mg | ORAL_TABLET | Freq: Every day | ORAL | Status: DC
  Administered 2022-12-09 – 2022-12-12 (×4): 100 mg via ORAL
  Filled 2022-12-09 (×4): qty 2

## 2022-12-09 MED ORDER — SODIUM CHLORIDE 0.9 % IV SOLN
500.0000 mg | INTRAVENOUS | Status: DC
  Administered 2022-12-10 – 2022-12-12 (×3): 500 mg via INTRAVENOUS
  Filled 2022-12-09 (×3): qty 5

## 2022-12-09 MED ORDER — CLOPIDOGREL BISULFATE 75 MG PO TABS
75.0000 mg | ORAL_TABLET | Freq: Every day | ORAL | Status: DC
  Administered 2022-12-09 – 2022-12-12 (×4): 75 mg via ORAL
  Filled 2022-12-09 (×4): qty 1

## 2022-12-09 MED ORDER — INSULIN ASPART 100 UNIT/ML IJ SOLN
0.0000 [IU] | Freq: Three times a day (TID) | INTRAMUSCULAR | Status: DC
  Administered 2022-12-09: 7 [IU] via SUBCUTANEOUS
  Administered 2022-12-09: 5 [IU] via SUBCUTANEOUS
  Administered 2022-12-09 – 2022-12-10 (×2): 3 [IU] via SUBCUTANEOUS
  Administered 2022-12-10: 5 [IU] via SUBCUTANEOUS
  Filled 2022-12-09 (×5): qty 1

## 2022-12-09 MED ORDER — TRAMADOL HCL 50 MG PO TABS
50.0000 mg | ORAL_TABLET | Freq: Once | ORAL | Status: AC
  Administered 2022-12-09: 50 mg via ORAL
  Filled 2022-12-09: qty 1

## 2022-12-09 MED ORDER — METHYLPREDNISOLONE SODIUM SUCC 40 MG IJ SOLR
40.0000 mg | Freq: Two times a day (BID) | INTRAMUSCULAR | Status: DC
  Administered 2022-12-09 – 2022-12-11 (×5): 40 mg via INTRAVENOUS
  Filled 2022-12-09 (×5): qty 1

## 2022-12-09 MED ORDER — SODIUM CHLORIDE 0.9 % IV BOLUS (SEPSIS)
1000.0000 mL | Freq: Once | INTRAVENOUS | Status: AC
  Administered 2022-12-09: 1000 mL via INTRAVENOUS

## 2022-12-09 MED ORDER — OXYCODONE-ACETAMINOPHEN 5-325 MG PO TABS: 1.0000 | ORAL_TABLET | ORAL | Status: DC | PRN

## 2022-12-09 MED ORDER — GABAPENTIN 100 MG PO CAPS: 100.0000 mg | ORAL_CAPSULE | Freq: Every day | ORAL | Status: DC | PRN

## 2022-12-09 MED ORDER — DIPHENHYDRAMINE HCL 50 MG/ML IJ SOLN: 12.5000 mg | Freq: Three times a day (TID) | INTRAMUSCULAR | Status: DC | PRN

## 2022-12-09 MED ORDER — INSULIN ASPART 100 UNIT/ML IJ SOLN
0.0000 [IU] | Freq: Every day | INTRAMUSCULAR | Status: DC
  Administered 2022-12-09: 2 [IU] via SUBCUTANEOUS
  Filled 2022-12-09 (×2): qty 1

## 2022-12-09 MED ORDER — DONEPEZIL HCL 5 MG PO TABS
10.0000 mg | ORAL_TABLET | Freq: Every day | ORAL | Status: DC
  Administered 2022-12-09 – 2022-12-11 (×3): 10 mg via ORAL
  Filled 2022-12-09 (×3): qty 2

## 2022-12-09 MED ORDER — SODIUM CHLORIDE 0.9 % IV SOLN
1.0000 g | Freq: Once | INTRAVENOUS | Status: AC
  Administered 2022-12-09: 1 g via INTRAVENOUS
  Filled 2022-12-09: qty 10

## 2022-12-09 MED ORDER — POTASSIUM CHLORIDE CRYS ER 20 MEQ PO TBCR
40.0000 meq | EXTENDED_RELEASE_TABLET | Freq: Once | ORAL | Status: AC
  Administered 2022-12-09: 40 meq via ORAL
  Filled 2022-12-09: qty 2

## 2022-12-09 MED ORDER — FAMOTIDINE 20 MG PO TABS
20.0000 mg | ORAL_TABLET | Freq: Every day | ORAL | Status: DC
  Administered 2022-12-09 – 2022-12-12 (×4): 20 mg via ORAL
  Filled 2022-12-09 (×4): qty 1

## 2022-12-09 MED ORDER — IPRATROPIUM-ALBUTEROL 0.5-2.5 (3) MG/3ML IN SOLN
3.0000 mL | Freq: Once | RESPIRATORY_TRACT | Status: AC
  Administered 2022-12-09: 3 mL via RESPIRATORY_TRACT
  Filled 2022-12-09: qty 3

## 2022-12-09 MED ORDER — AMIODARONE HCL 200 MG PO TABS
200.0000 mg | ORAL_TABLET | Freq: Every day | ORAL | Status: DC
  Administered 2022-12-09 – 2022-12-12 (×4): 200 mg via ORAL
  Filled 2022-12-09 (×4): qty 1

## 2022-12-09 MED ORDER — SODIUM CHLORIDE 0.9 % IV BOLUS (SEPSIS)
500.0000 mL | Freq: Once | INTRAVENOUS | Status: AC
  Administered 2022-12-09: 500 mL via INTRAVENOUS

## 2022-12-09 MED ORDER — LACTATED RINGERS IV SOLN: INTRAVENOUS | Status: DC

## 2022-12-09 MED ORDER — SODIUM CHLORIDE 0.9 % IV SOLN
500.0000 mg | Freq: Once | INTRAVENOUS | Status: AC
  Administered 2022-12-09: 500 mg via INTRAVENOUS
  Filled 2022-12-09: qty 5

## 2022-12-09 MED ORDER — PRAVASTATIN SODIUM 20 MG PO TABS
20.0000 mg | ORAL_TABLET | Freq: Every day | ORAL | Status: DC
  Administered 2022-12-09 – 2022-12-11 (×3): 20 mg via ORAL
  Filled 2022-12-09 (×3): qty 1

## 2022-12-09 MED ORDER — POTASSIUM CHLORIDE CRYS ER 20 MEQ PO TBCR
40.0000 meq | EXTENDED_RELEASE_TABLET | ORAL | Status: AC
  Administered 2022-12-09 (×2): 40 meq via ORAL
  Filled 2022-12-09 (×2): qty 2

## 2022-12-09 MED ORDER — METHYLPREDNISOLONE SODIUM SUCC 125 MG IJ SOLR
125.0000 mg | Freq: Once | INTRAMUSCULAR | Status: AC
  Administered 2022-12-09: 125 mg via INTRAVENOUS
  Filled 2022-12-09: qty 2

## 2022-12-09 MED ORDER — FUROSEMIDE 10 MG/ML IJ SOLN
40.0000 mg | Freq: Two times a day (BID) | INTRAMUSCULAR | Status: DC
  Administered 2022-12-09 (×2): 40 mg via INTRAVENOUS
  Filled 2022-12-09 (×2): qty 4

## 2022-12-09 MED ORDER — SODIUM CHLORIDE 0.9 % IV SOLN: 500.0000 mg | INTRAVENOUS | Status: DC

## 2022-12-09 NOTE — H&P (Deleted)
Hx of CHF, recent COVID w/now chronic cough who presented with respiratory distress. 89% on room air. Duonebx x 1 + Solumedrol. CXR suggesting volume and possible bronchopneumonia, per ED MD.Has been given lasix and covered with ceftriaxone & azithro.

## 2022-12-09 NOTE — H&P (Signed)
History and Physical    Alison Richardson MWU:[id number] DOB: 01/18/1933 DOA: 12/09/2022  Referring MD/NP/PA:   PCP: Jon Billings, NP   Patient coming from:  The patient is coming from independent living facility          Chief Complaint: SOB  HPI: Alison Richardson is a 88 y.o. female with medical history significant of hypertension, hyperlipidemia, diabetes mellitus, GERD, hypothyroidism, depression, anxiety, OSA not on CPAP, PVD, mild pulmonary hypertension, Alzheimer disease, systolic CHF, atrial fibrillation on Eliquis, who presents with shortness breath.  Patient states that she had COVID infection about 4 weeks ago.  In the past several days, she developed shortness breath, cough, wheezing which has been progressively worsening.  She coughs up clear mucus, no fever or chills.  She has a chest discomfort.  No nausea vomiting, diarrhea or abdominal pain with no symptoms of UTI.  Patient states that she is taking Eliquis consistently, last dose was last night.  Data reviewed independently and ED Course: pt was found to have troponin level 8 --> 9, WBC 16.1, lactic acid 2.9, BNP 407, negative COVID PCR and flu, potassium 3.1, renal function okay, temperature normal, blood pressure 168/10 3, heart rate of 109, RR 20, oxygen saturation 89% on room air, which improved to 94% on 2 L oxygen (patient does not use oxygen at home).  Chest x-ray with multifocal infiltration and bronchitis change.  Patient is admitted to telemetry bed as inpatient.   EKG: I have personally reviewed.  Atrial fibrillation, QTc 514, LAD, low voltage, poor R wave progression.  Review of Systems:   General: no fevers, chills, no body weight gain, has fatigue HEENT: no blurry vision, hearing changes or sore throat Respiratory: has dyspnea, coughing, wheezing CV: has chest discomfort, no palpitations GI: no nausea, vomiting, abdominal pain, diarrhea, constipation GU: no dysuria, burning on urination, increased urinary  frequency, hematuria  Ext: has leg edema Neuro: no unilateral weakness, numbness, or tingling, no vision change or hearing loss Skin: no rash, no skin tear. MSK: No muscle spasm, no deformity, no limitation of range of movement in spin Heme: No easy bruising.  Travel history: No recent long distant travel.   Allergy:  Allergies  Allergen Reactions   Pantoprazole Nausea And Vomiting   Metformin And Related Diarrhea    Past Medical History:  Diagnosis Date   Arrhythmia    Atrial flutter ([place]) 01/2018   new onset    Basal cell carcinoma of back    Basal cell carcinoma of lip    Cerebral aneurysm    followed by [facility]   CHF (congestive heart failure) (HCC)    DDD (degenerative disc disease), lumbar    superior plate depression, L3 08/18/2014   Diabetes mellitus type II, controlled ([place])    Diverticulosis    Dysrhythmia    Paroxysmal Supraventricular Tachycardia   Dysthymia    depression   GERD (gastroesophageal reflux disease)    History of meniscal tear    Hyperlipidemia    Hypertension    Hypothyroidism    Late onset Alzheimer's disease with behavioral disturbance ([place])    Mild pulmonary hypertension ([place])    Overactive bladder    Peripheral vascular disease ([place])    Seasonal allergic rhinitis    Sleep apnea     Past Surgical History:  Procedure Laterality Date   APPENDECTOMY  1946   BASAL CELL CARCINOMA EXCISION     2006 and 2009 removed from back and lip   CARDIOVASCULAR STRESS TEST  2015   nuclear cardiac stress test negative for ischemia - Dr. Satira Sark   CATARACT EXTRACTION, BILATERAL  2006   COLONOSCOPY  2014   COLONOSCOPY N/A 08/19/2020   Procedure: COLONOSCOPY;  Surgeon: Lesly Rubenstein, MD;  Location: [facility] ENDOSCOPY;  Service: Endoscopy;  Laterality: N/A;   ECTOPIC PREGNANCY SURGERY  1957   ESOPHAGOGASTRODUODENOSCOPY N/A 08/19/2020   Procedure: ESOPHAGOGASTRODUODENOSCOPY (EGD);  Surgeon: Lesly Rubenstein, MD;  Location[facility]COPY;  Service:  Endoscopy;  Laterality: N/A;   INJECTION KNEE Left 05/2015   LEFT HEART CATH AND CORONARY ANGIOGRAPHY N/A 10/12/2021   Procedure: LEFT HEART CATH AND CORONARY ANGIOGRAPHY;  Surgeon: Corey Skains, MD;  Location: [place] CV LAB;  Service: Cardiovascular;  Laterality: N/A;   REPLACEMENT TOTAL KNEE Left 09/06/2016   Dr. Barnet Pall   TONSILLECTOMY AND ADENOIDECTOMY  1953   TOTAL ABDOMINAL HYSTERECTOMY  1986   [facility]    Social History:  reports that she has never smoked. She has never used smokeless tobacco. She reports current alcohol use of about 3.0 standard drinks of alcohol per week. She reports that she does not use drugs.  Family History:  Family History  Problem Relation Age of Onset   Hypertension Mother    Heart disease Father    CAD Father    Heart disease Sister    Diabetes Sister    Diabetes Paternal Uncle    Sleep apnea Son      Prior to Admission medications   Medication Sig Start Date End Date Taking? Authorizing Provider  albuterol (VENTOLIN HFA) 108 (90 Base) MCG/ACT inhaler INHALE 2 PUFFS INTO THE LUNGS EVERY 6 HOURS AS NEEDED FOR WHEEZING OR SHORTNESS OF BREATH 08/29/22   Jon Billings, NP  amiodarone (PACERONE) 200 MG tablet Take 200 mg by mouth daily.  01/02/19   [provider]  budesonide-formoterol (SYMBICORT) 160-4.5 MCG/ACT inhaler Inhale 2 puffs into the lungs 2 (two) times daily. 11/28/22   Armando Reichert, MD  clopidogrel (PLAVIX) 75 MG tablet Take 75 mg by mouth daily.    [provider]  desloratadine (CLARINEX) 5 MG tablet Take 1 tablet (5 mg total) by mouth daily. 11/28/22 11/28/23  Armando Reichert, MD  diltiazem (CARDIZEM CD) 240 MG 24 hr capsule Take 1 capsule by mouth daily. 11/23/22   [provider]  donepezil (ARICEPT) 10 MG tablet TAKE ONE TABLET (10 MG) BY MOUTH AT BEDTIME 09/19/22   Jon Billings, NP  ELIQUIS 5 MG TABS tablet Take 5 mg by mouth 2 (two) times daily.  05/06/20   [provider]  famotidine  (PEPCID) 20 MG tablet TAKE 1 TABLET BY MOUTH ONCE DAILY 10/24/22   Jon Billings, NP  fluticasone [facility]) 50 MCG/ACT nasal spray Place 1 spray into both nostrils daily. 11/28/22 11/28/23  Armando Reichert, MD  gabapentin (NEURONTIN) 100 MG capsule Take 100 mg by mouth daily. 05/06/20   [provider]  glipiZIDE (GLUCOTROL XL) 2.5 MG 24 hr tablet TAKE ONE TABLET BY MOUTH ONCE DAILY 09/19/22   Jon Billings, NP  isosorbide mononitrate (IMDUR) 30 MG 24 hr tablet Take 30 mg by mouth daily.    [provider]  levothyroxine (SYNTHROID) 88 MCG tablet Take 1 tablet (88 mcg total) by mouth daily. 07/17/22   Jon Billings, NP  metoprolol succinate (TOPROL-XL) 100 MG 24 hr tablet TAKE ONE TABLET (100 MG) BY MOUTH EVERY DAY (TAKE WITH OR IMMEDIATELY AFTER A MEAL) 10/24/22   Jon Billings, NP  nitroGLYCERIN (NITROSTAT) 0.4 MG  SL tablet Place 0.4 mg under the tongue every 5 (five) minutes as needed for chest pain.    [provider]  omeprazole (PRILOSEC) 20 MG capsule Take 1 capsule (20 mg total) by mouth 2 (two) times daily before a meal. 09/10/22   Jon Billings, NP  oxyCODONE (OXY IR/ROXICODONE) 5 MG immediate release tablet Take 1 tablet (5 mg total) by mouth every 4 (four) hours as needed for severe pain or breakthrough pain. 05/17/22   Dwyane Dee, MD  pravastatin (PRAVACHOL) 20 MG tablet Take 20 mg by mouth at bedtime. 07/05/20   [provider]  torsemide (DEMADEX) 20 MG tablet Take 1 tablet (20 mg total) by mouth daily. 11/03/21   Richarda Osmond, MD  venlafaxine XR (EFFEXOR-XR) 75 MG 24 hr capsule TAKE 1 CAPSULE BY MOUTH ONCE DAILY. 09/19/22   Jon Billings, NP    Physical Exam: Vitals:   12/09/22 0900 12/09/22 0930 12/09/22 1000 12/09/22 1030  BP: (!) 145/89 (!) 141/84 (!) 144/85 (!) 144/80  Pulse: 80 90 85 82  Resp: (!) 25 (!) 24 (!) 21 (!) 26  Temp:      TempSrc:      SpO2: 91% 92% 91% (!) 83%  Weight:      Height:       General:  Not in acute distress HEENT:       Eyes: PERRL, EOMI, no scleral icterus.       ENT: No discharge from the ears and nose, no pharynx injection, no tonsillar enlargement.        Neck: No JVD, no bruit, no mass felt. Heme: No neck lymph node enlargement. Cardiac: S1/S2, RRR, No murmurs, No gallops or rubs. Respiratory: Has mild wheezing bilaterally GI: Soft, nondistended, nontender, no rebound pain, no organomegaly, BS present. GU: No hematuria Ext: has trace leg edema bilaterally. 1+DP/PT pulse bilaterally. Musculoskeletal: No joint deformities, No joint redness or warmth, no limitation of ROM in spin. Skin: No rashes.  Neuro: Alert, oriented X3, cranial nerves II-XII grossly intact, moves all extremities normally.  Psych: Patient is not psychotic, no suicidal or hemocidal ideation.  Labs on Admission: I have personally reviewed following labs and imaging studies  CBC: Recent Labs  Lab 12/09/22 0405  WBC 16.1*  NEUTROABS 12.8*  HGB 12.9  HCT 41.1  MCV 86.5  PLT 078   Basic Metabolic Panel: Recent Labs  Lab 12/09/22 0405  NA 141  K 3.1*  CL 109  CO2 24  GLUCOSE 210*  BUN 14  CREATININE 0.65  CALCIUM 9.0  MG 2.2   GFR: Estimated Creatinine Clearance: 51.9 mL/min (by C-G formula based on SCr of 0.65 mg/dL). Liver Function Tests: Recent Labs  Lab 12/09/22 0405  AST 63*  ALT 55*  ALKPHOS 113  BILITOT 1.1  PROT 7.8  ALBUMIN 4.3   No results for input(s): "LIPASE", "AMYLASE" in the last 168 hours. No results for input(s): "AMMONIA" in the last 168 hours. Coagulation Profile: No results for input(s): "INR", "PROTIME" in the last 168 hours. Cardiac Enzymes: No results for input(s): "CKTOTAL", "CKMB", "CKMBINDEX", "TROPONINI" in the last 168 hours. BNP (last 3 results) No results for input(s): "PROBNP" in the last 8760 hours. HbA1C: No results for input(s): "HGBA1C" in the last 72 hours. CBG: Recent Labs  Lab 12/09/22 0803  GLUCAP 271*   Lipid Profile: No  results for input(s): "CHOL", "HDL", "LDLCALC", "TRIG", "CHOLHDL", "LDLDIRECT" in the last 72 hours. Thyroid Function Tests: No results for input(s): "TSH", "T4TOTAL", "FREET4", "  T3FREE", "THYROIDAB" in the last 72 hours. Anemia Panel: No results for input(s): "VITAMINB12", "FOLATE", "FERRITIN", "TIBC", "IRON", "RETICCTPCT" in the last 72 hours. Urine analysis:    Component Value Date/Time   COLORURINE YELLOW 01/25/2022 1043   APPEARANCEUR Clear 10/15/2022 1434   LABSPEC 1.015 01/25/2022 1043   PHURINE 5.0 01/25/2022 1043   GLUCOSEU Negative 10/15/2022 1434   HGBUR LARGE (A) 01/25/2022 1043   BILIRUBINUR Negative 10/15/2022 1434   KETONESUR 15 (A) 01/25/2022 1043   PROTEINUR Negative 10/15/2022 1434   PROTEINUR >300 (A) 01/25/2022 1043   NITRITE Negative 10/15/2022 1434   NITRITE POSITIVE (A) 01/25/2022 1043   LEUKOCYTESUR 1+ (A) 10/15/2022 1434   LEUKOCYTESUR MODERATE (A) 01/25/2022 1043   Sepsis Labs: '@LABRCNTIP'$ (procalcitonin:4,lacticidven:4) ) Recent Results (from the past 240 hour(s))  Resp panel by RT-PCR (RSV, Flu A&B, Covid) Anterior Nasal Swab     Status: None   Collection Time: 12/09/22  4:05 AM   Specimen: Anterior Nasal Swab  Result Value Ref Range Status   SARS Coronavirus 2 by RT PCR NEGATIVE NEGATIVE Final    Comment: (NOTE) SARS-CoV-2 target nucleic acids are NOT DETECTED.  The SARS-CoV-2 RNA is generally detectable in upper respiratory specimens during the acute phase of infection. The lowest concentration of SARS-CoV-2 viral copies this assay can detect is 138 copies/mL. A negative result does not preclude SARS-Cov-2 infection and should not be used as the sole basis for treatment or other patient management decisions. A negative result may occur with  improper specimen collection/handling, submission of specimen other than nasopharyngeal swab, presence of viral mutation(s) within the areas targeted by this assay, and inadequate number of viral copies(<138  copies/mL). A negative result must be combined with clinical observations, patient history, and epidemiological information. The expected result is Negative.  Fact Sheet for Patients:  EntrepreneurPulse.com.au  Fact Sheet for Healthcare Providers:  IncredibleEmployment.be  This test is no t yet approved or cleared by the [place] FDA and  has been authorized for detection and/or diagnosis of SARS-CoV-2 by FDA under an Emergency Use Authorization (EUA). This EUA will remain  in effect (meaning this test can be used) for the duration of the COVID-19 declaration under Section 564(b)(1) of the Act, 21 U.S.C.section 360bbb-3(b)(1), unless the authorization is terminated  or revoked sooner.       Influenza A by PCR NEGATIVE NEGATIVE Final   Influenza B by PCR NEGATIVE NEGATIVE Final    Comment: (NOTE) The Xpert Xpress SARS-CoV-2/FLU/RSV plus assay is intended as an aid in the diagnosis of influenza from Nasopharyngeal swab specimens and should not be used as a sole basis for treatment. Nasal washings and aspirates are unacceptable for Xpert Xpress SARS-CoV-2/FLU/RSV testing.  Fact Sheet for Patients: EntrepreneurPulse.com.au  Fact Sheet for Healthcare Providers: IncredibleEmployment.be  This test is not yet approved or cleared by the [place] FDA and has been authorized for detection and/or diagnosis of SARS-CoV-2 by FDA under an Emergency Use Authorization (EUA). This EUA will remain in effect (meaning this test can be used) for the duration of the COVID-19 declaration under Section 564(b)(1) of the Act, 21 U.S.C. section 360bbb-3(b)(1), unless the authorization is terminated or revoked.     Resp Syncytial Virus by PCR NEGATIVE NEGATIVE Final    Comment: (NOTE) Fact Sheet for Patients: EntrepreneurPulse.com.au  Fact Sheet for Healthcare  Providers: IncredibleEmployment.be  This test is not yet approved or cleared by the [place] FDA and has been authorized for detection and/or diagnosis of SARS-CoV-2 by FDA under  an Emergency Use Authorization (EUA). This EUA will remain in effect (meaning this test can be used) for the duration of the COVID-19 declaration under Section 564(b)(1) of the Act, 21 U.S.C. section 360bbb-3(b)(1), unless the authorization is terminated or revoked.  Performed at [facility], [address] [place], [place] [address]   Culture, blood (routine x 2)     Status: None (Preliminary result)   Collection Time: 12/09/22  5:30 AM   Specimen: BLOOD LEFT WRIST  Result Value Ref Range Status   Specimen Description BLOOD LEFT WRIST  Final   Special Requests   Final    BOTTLES DRAWN AEROBIC AND ANAEROBIC Blood Culture adequate volume   Culture   Final    NO GROWTH < 12 HOURS Performed at [facility], [address], [place], [place] [address]    Report Status PENDING  Incomplete     Radiological Exams on Admission: DG Chest Port 1 View  Result Date: 12/09/2022 CLINICAL DATA:  88 year old female with history of shortness of breath. Recent diagnosis of COVID. EXAM: PORTABLE CHEST 1 VIEW COMPARISON:  Chest x-ray 11/09/2022. FINDINGS: Lung volumes are normal. Widespread interstitial prominence, patchy peribronchial cuffing and ill-defined opacities scattered throughout the lungs bilaterally. No definite pleural effusions. No pneumothorax. Pulmonary vasculature is largely obscured. Heart size is borderline enlarged. Upper mediastinal contours are within normal limits. Atherosclerotic calcifications are noted in the thoracic aorta. IMPRESSION: 1. The appearance the chest is most compatible with severe bronchitis and developing multilobar bilateral bronchopneumonia, as above. 2. Aortic atherosclerosis. Electronically Signed   By: Vinnie Langton M.D.   On: 12/09/2022  05:13      Assessment/Plan Principal Problem:   Multifocal pneumonia Active Problems:   Severe sepsis (HCC)   Chronic diastolic CHF (congestive heart failure) (HCC)   Hypothyroidism   Type 2 diabetes mellitus with peripheral neuropathy (HCC)   Atrial fibrillation, chronic (HCC)   HLD (hyperlipidemia)   CAD (coronary artery disease)   Alzheimer disease ([place])   Depression with anxiety   Assessment and Plan:  Multifocal pneumonia and severe sepsis [facility]): Patient meets criteria for severe sepsis with WBC 16.1, heart rate 109, elevated lactic acid 2.9.  Patient has 3 L new oxygen requirement. Patient is taking Eliquis consistently, I have low suspicions for PE.     - Admit to tele bed as inpt - IV Rocephin and doxycycline - Incentive spirometry - Solu-Medrol 40 mg twice daily - Mucinex for cough  - Bronchodilators - Urine legionella and S. pneumococcal antigen - Follow up blood culture x2, sputum culture - will get Procalcitonin and trend lactic acid level per sepsis protocol - IVF: 2.5L of NS bolus in ED. Patient received 2.5 L normal saline bolus in the ED, but patient has elevated BNP 407, will hold off further IV fluid and started diuretics.  Chronic diastolic CHF (congestive heart failure) ([place]): 2D echo on 10/31/2021 showed EF of 45 to 50%.  Patient has trace leg edema, elevated BNP 407, at risk of developing CHF exacerbation.  Patient received 2.5 L normal saline bolus in ED. -Patient received 1 mg of Bumex in ED -Will continue IV Lasix 40 mg twice daily  Hypothyroidism -Synthroid  Type 2 diabetes mellitus with peripheral neuropathy ([place]): A1c 7.6 recently, poorly controlled.  Patient is taking glipizide at home -SSI  Atrial fibrillation, chronic (HCC) -Continue Eliquis -Metoprolol, Cardizem  HLD (hyperlipidemia) -Pravastatin  CAD (coronary artery disease) -Plavix, pravastatin, Imdur -As needed nitroglycerin  Alzheimer disease ([place]): No behavior  disturbance -Donepezil  Depression with anxiety -Continue home medications      DVT ppx: on Eliquis   Code Status: DNR (I discussed with patient and explained the meaning of [place]. Patient wants to be DNR)  Family Communication: I offered to call her family, but patient states that her sister and her daughter were here earlier with her, they know what is going on for her.  She does not want me to call her family, she thinks that they are sleeping, and does not want to disturb them.   Disposition Plan:  Anticipate discharge back to previous environment, independent living facility   Consults called:  none  Admission status and Level of care: Telemetry Medical:    as inpt     Dispo: The patient is from:  The independent living facility              Anticipated d/c is to:  Independent living facility              Anticipated d/c date is: 2 days              Patient currently is not medically stable to d/c.    Severity of Illness:  The appropriate patient status for this patient is INPATIENT. Inpatient status is judged to be reasonable and necessary in order to provide the required intensity of service to ensure the patient's safety. The patient's presenting symptoms, physical exam findings, and initial radiographic and laboratory data in the context of their chronic comorbidities is felt to place them at high risk for further clinical deterioration. Furthermore, it is not anticipated that the patient will be medically stable for discharge from the hospital within 2 midnights of admission.   * I certify that at the point of admission it is my clinical judgment that the patient will require inpatient hospital care spanning beyond 2 midnights from the point of admission due to high intensity of service, high risk for further deterioration and high frequency of surveillance required.*       Date of Service 12/09/2022    Ivor Costa [facility]   If 7PM-7AM, please  contact night-coverage [email or web address] 12/09/2022, 12:05 PM

## 2022-12-09 NOTE — ED Notes (Signed)
O2 incrase to 4L [place] d/t low saturation.

## 2022-12-09 NOTE — Significant Event (Signed)
Signout note:  Hx of CHF, recent COVID w/now chronic cough who presented with respiratory distress. 89% on room air. Duonebx x 1 + Solumedrol. CXR suggesting volume and possible bronchopneumonia, per ED MD.Has been given lasix and covered with ceftriaxone & azithro.

## 2022-12-09 NOTE — ED Notes (Signed)
Lactic reported to MD

## 2022-12-09 NOTE — ED Provider Notes (Addendum)
[facility] Provider Note    Event Date/Time   First MD Initiated Contact with Patient 12/09/22 [phone]     (approximate)   History   Difficulty breathing   HPI  Alison Richardson is a 88 y.o. female brought to the ED via EMS from independent living with a chief complaint of cough and breathing difficulty.  Patient has a history of CHF and asthma not on home oxygen.  EMS reports room air saturation 89-90%.  Endorses nonproductive cough and wheezing.  Given DuoNeb prior to arrival with improvement in symptoms.  Denies fever/chills, chest pain, abdominal pain, nausea, vomiting or dizziness.     Past Medical History   Past Medical History:  Diagnosis Date   Arrhythmia    Atrial flutter ([place]) 01/2018   new onset    Basal cell carcinoma of back    Basal cell carcinoma of lip    Cerebral aneurysm    followed by [facility]   CHF (congestive heart failure) (HCC)    DDD (degenerative disc disease), lumbar    superior plate depression, L3 08/18/2014   Diabetes mellitus type II, controlled ([place])    Diverticulosis    Dysrhythmia    Paroxysmal Supraventricular Tachycardia   Dysthymia    depression   GERD (gastroesophageal reflux disease)    History of meniscal tear    Hyperlipidemia    Hypertension    Hypothyroidism    Late onset Alzheimer's disease with behavioral disturbance ([place])    Mild pulmonary hypertension (HCC)    Overactive bladder    Peripheral vascular disease (HCC)    Seasonal allergic rhinitis    Sleep apnea      Active Problem List   Patient Active Problem List   Diagnosis Date Noted   COVID-19 11/06/2022   Chronic cough 10/15/2022   Humerus fracture 05/16/2022   Atrial fibrillation, chronic ([place]) 05/16/2022   Humeral surgical neck fracture 05/16/2022   Intractable pain 05/15/2022   Use of cane as ambulatory aid 04/03/2022   History of falling 04/03/2022   History of non-ST elevation myocardial infarction (NSTEMI) 04/03/2022   History of  cerebral aneurysm 04/03/2022   Gastroesophageal reflux disease 02/12/2022   DDD (degenerative disc disease), lumbar    Chronic diastolic CHF (congestive heart failure) ([place]) 10/13/2020   DOE (dyspnea on exertion) 05/09/2020   Cor pulmonale, chronic ([place]) 09/27/2019   Late onset Alzheimer's disease without behavioral disturbance ([place]) 09/23/2019   Obstructive sleep apnea 07/09/2019   Type 2 diabetes mellitus with peripheral neuropathy ([place]) 01/21/2019   PSVT (paroxysmal supraventricular tachycardia) 01/21/2019   PAD (peripheral artery disease) ([place]) 01/06/2019   DM type 2 with diabetic mixed hyperlipidemia ([place]) 09/08/2018   Anxiety disorder 06/19/2018   Depression, major, single episode, mild ([place]) 06/19/2018   Vitamin B12 deficiency 06/19/2018   Insomnia 06/19/2018   Protein-calorie malnutrition ([place]) 06/19/2018   Hypertension associated with diabetes ([place]) 06/19/2018   Hypothyroidism 06/19/2018   Overactive bladder 06/19/2018   Primary osteoarthritis involving multiple joints 06/18/2018   Hypertriglyceridemia 11/01/2015     Past Surgical History   Past Surgical History:  Procedure Laterality Date   APPENDECTOMY  1946   BASAL CELL CARCINOMA EXCISION     2006 and 2009 removed from back and lip   CARDIOVASCULAR STRESS TEST  2015   nuclear cardiac stress test negative for ischemia - Dr. Satira Sark   CATARACT EXTRACTION, BILATERAL  2006   COLONOSCOPY  2014   COLONOSCOPY N/A 08/19/2020   Procedure: COLONOSCOPY;  Surgeon: Lesly Rubenstein, MD;  Location[facility] END[facility]COPY;  Service: Endoscopy;  Laterality: N/A;   ECTOPIC PREGNANCY SURGERY  1957   ESOPHAGOGASTRODUODENOSCOPY N/A 08/19/2020   Procedure: ESOPHAGOGASTRODUODENOSCOPY (EGD);  Surgeon: Lesly Rubenstein, MD;  Location: [facility] ENDOSCOPY;  Service: Endoscopy;  Laterality: N/A;   INJECTION KNEE Left 05/2015   LEFT HEART CATH AND CORONARY ANGIOGRAPHY N/A 10/12/2021   Procedure: LEFT HEART CATH AND CORONARY ANGIOGRAPHY;  Surgeon:  Corey Skains, MD;  Location: [facility] CV LAB;  Service: Cardiovascular;  Laterality: N/A;   REPLACEMENT TOTAL KNEE Left 09/06/2016   Dr. Barnet Pall   TONSILLECTOMY AND ADENOIDECTOMY  1953   TOTAL ABDOMINAL HYSTERECTOMY  1986   DU B     Home Medications   Prior to Admission medications   Medication Sig Start Date End Date Taking? Authorizing Provider  albuterol (VENTOLIN HFA) 108 (90 Base) MCG/ACT inhaler INHALE 2 PUFFS INTO THE LUNGS EVERY 6 HOURS AS NEEDED FOR WHEEZING OR SHORTNESS OF BREATH 08/29/22   Jon Billings, NP  amiodarone (PACERONE) 200 MG tablet Take 200 mg by mouth daily.  01/02/19   [provider]  budesonide-formoterol (SYMBICORT) 160-4.5 MCG/ACT inhaler Inhale 2 puffs into the lungs 2 (two) times daily. 11/28/22   Armando Reichert, MD  clopidogrel (PLAVIX) 75 MG tablet Take 75 mg by mouth daily.    [provider]  desloratadine (CLARINEX) 5 MG tablet Take 1 tablet (5 mg total) by mouth daily. 11/28/22 11/28/23  Armando Reichert, MD  diltiazem (CARDIZEM CD) 240 MG 24 hr capsule Take 1 capsule by mouth daily. 11/23/22   [provider]  donepezil (ARICEPT) 10 MG tablet TAKE ONE TABLET (10 MG) BY MOUTH AT BEDTIME 09/19/22   Jon Billings, NP  ELIQUIS 5 MG TABS tablet Take 5 mg by mouth 2 (two) times daily.  05/06/20   [provider]  famotidine (PEPCID) 20 MG tablet TAKE 1 TABLET BY MOUTH ONCE DAILY 10/24/22   Jon Billings, NP  fluticasone [facility]) 50 MCG/ACT nasal spray Place 1 spray into both nostrils daily. 11/28/22 11/28/23  Armando Reichert, MD  gabapentin (NEURONTIN) 100 MG capsule Take 100 mg by mouth daily. 05/06/20   [provider]  glipiZIDE (GLUCOTROL XL) 2.5 MG 24 hr tablet TAKE ONE TABLET BY MOUTH ONCE DAILY 09/19/22   Jon Billings, NP  isosorbide mononitrate (IMDUR) 30 MG 24 hr tablet Take 30 mg by mouth daily.    [provider]  levothyroxine (SYNTHROID) 88 MCG tablet Take 1 tablet (88 mcg total) by  mouth daily. 07/17/22   Jon Billings, NP  metoprolol succinate (TOPROL-XL) 100 MG 24 hr tablet TAKE ONE TABLET (100 MG) BY MOUTH EVERY DAY (TAKE WITH OR IMMEDIATELY AFTER A MEAL) 10/24/22   Jon Billings, NP  nitroGLYCERIN (NITROSTAT) 0.4 MG SL tablet Place 0.4 mg under the tongue every 5 (five) minutes as needed for chest pain.    [provider]  omeprazole (PRILOSEC) 20 MG capsule Take 1 capsule (20 mg total) by mouth 2 (two) times daily before a meal. 09/10/22   Jon Billings, NP  oxyCODONE (OXY IR/ROXICODONE) 5 MG immediate release tablet Take 1 tablet (5 mg total) by mouth every 4 (four) hours as needed for severe pain or breakthrough pain. 05/17/22   Dwyane Dee, MD  pravastatin (PRAVACHOL) 20 MG tablet Take 20 mg by mouth at bedtime. 07/05/20   [provider]  torsemide (DEMADEX) 20 MG tablet Take 1 tablet (20 mg total) by mouth daily. 11/03/21  Richarda Osmond, MD  venlafaxine XR (EFFEXOR-XR) 75 MG 24 hr capsule TAKE 1 CAPSULE BY MOUTH ONCE DAILY. 09/19/22   Jon Billings, NP     Allergies  Pantoprazole and Metformin and related   Family History   Family History  Problem Relation Age of Onset   Hypertension Mother    Heart disease Father    CAD Father    Heart disease Sister    Diabetes Sister    Diabetes Paternal Uncle    Sleep apnea Son      Physical Exam  Triage Vital Signs: ED Triage Vitals  Enc Vitals Group     BP      Pulse      Resp      Temp      Temp src      SpO2      Weight      Height      Head Circumference      Peak Flow      Pain Score      Pain Loc      Pain Edu?      Excl. in [place]?     Updated Vital Signs: BP (!) 168/103 (BP Location: Right Arm)   Pulse (!) 109   Temp 97.8 F (36.6 C) (Oral)   Ht '5\' 6"'$  (1.676 m)   Wt 77.1 kg   SpO2 94%   BMI 27.44 kg/m    General: Awake, mild distress.  CV:  Tachycardic.  Good peripheral perfusion.  Resp:  Increased effort.  Tachypneic.   Diminished. Abd:  Nontender.  No distention.  Other:  Supple calves, nontender.   ED Results / Procedures / Treatments  Labs (all labs ordered are listed, but only abnormal results are displayed) Labs Reviewed  CBC WITH DIFFERENTIAL/PLATELET - Abnormal; Notable for the following components:      Result Value   WBC 16.1 (*)    Neutro Abs 12.8 (*)    Abs Immature Granulocytes 0.10 (*)    All other components within normal limits  COMPREHENSIVE METABOLIC PANEL - Abnormal; Notable for the following components:   Potassium 3.1 (*)    Glucose, Bld 210 (*)    AST 63 (*)    ALT 55 (*)    All other components within normal limits  BRAIN NATRIURETIC PEPTIDE - Abnormal; Notable for the following components:   B Natriuretic Peptide 407.6 (*)    All other components within normal limits  LACTIC ACID, PLASMA - Abnormal; Notable for the following components:   Lactic Acid, Venous 2.9 (*)    All other components within normal limits  RESP PANEL BY RT-PCR (RSV, FLU A&B, COVID)  RVPGX2  CULTURE, BLOOD (ROUTINE X 2)  CULTURE, BLOOD (ROUTINE X 2)  LACTIC ACID, PLASMA  PROCALCITONIN  TROPONIN I (HIGH SENSITIVITY)  TROPONIN I (HIGH SENSITIVITY)     EKG  ED ECG REPORT I, Janiyah Beery J, the attending physician, personally viewed and interpreted this ECG.   Date: 12/09/2022  EKG Time: 0405  Rate: 108  Rhythm: atrial fibrillation, rate 108  Axis: Normal  Intervals:none  ST&T Change: Nonspecific    RADIOLOGY I have independently visualized and interpreted patient's chest x-ray as well as noted the radiology interpretation:  X-ray: Bronchopneumonia  Official radiology report(s): DG Chest Port 1 View  Result Date: 12/09/2022 CLINICAL DATA:  88 year old female with history of shortness of breath. Recent diagnosis of COVID. EXAM: PORTABLE CHEST 1 VIEW COMPARISON:  Chest x-ray 11/09/2022. FINDINGS: Lung  volumes are normal. Widespread interstitial prominence, patchy peribronchial cuffing  and ill-defined opacities scattered throughout the lungs bilaterally. No definite pleural effusions. No pneumothorax. Pulmonary vasculature is largely obscured. Heart size is borderline enlarged. Upper mediastinal contours are within normal limits. Atherosclerotic calcifications are noted in the thoracic aorta. IMPRESSION: 1. The appearance the chest is most compatible with severe bronchitis and developing multilobar bilateral bronchopneumonia, as above. 2. Aortic atherosclerosis. Electronically Signed   By: Vinnie Langton M.D.   On: 12/09/2022 05:13     PROCEDURES:  Critical Care performed: Yes, see critical care procedure note(s)  CRITICAL CARE Performed by: Paulette Blanch   Total critical care time: 30 minutes  Critical care time was exclusive of separately billable procedures and treating other patients.  Critical care was necessary to treat or prevent imminent or life-threatening deterioration.  Critical care was time spent personally by me on the following activities: development of treatment plan with patient and/or surrogate as well as nursing, discussions with consultants, evaluation of patient's response to treatment, examination of patient, obtaining history from patient or surrogate, ordering and performing treatments and interventions, ordering and review of laboratory studies, ordering and review of radiographic studies, pulse oximetry and re-evaluation of patient's condition.   Marland Kitchen1-3 Lead EKG Interpretation  Performed by: Paulette Blanch, MD Authorized by: Paulette Blanch, MD     Interpretation: abnormal     ECG rate:  105   ECG rate assessment: tachycardic     Rhythm: atrial fibrillation     Ectopy: none     Conduction: normal   Comments:     Patient placed on cardiac monitor to evaluate for arrhythmias    MEDICATIONS ORDERED IN ED: Medications  azithromycin (ZITHROMAX) 500 mg in sodium chloride 0.9 % 250 mL IVPB (500 mg Intravenous New Bag/Given 12/09/22 0639)  sodium  chloride 0.9 % bolus 1,000 mL (has no administration in time range)    And  sodium chloride 0.9 % bolus 1,000 mL (has no administration in time range)    And  sodium chloride 0.9 % bolus 500 mL (has no administration in time range)  methylPREDNISolone sodium succinate (SOLU-MEDROL) 125 mg/2 mL injection 125 mg (125 mg Intravenous Given 12/09/22 0410)  bumetanide (BUMEX) injection 1 mg (1 mg Intravenous Given 12/09/22 0516)  ipratropium-albuterol (DUONEB) 0.5-2.5 (3) MG/3ML nebulizer solution 3 mL (3 mLs Nebulization Given 12/09/22 0510)  traMADol (ULTRAM) tablet 50 mg (50 mg Oral Given 12/09/22 0510)  potassium chloride SA (KLOR-CON M) CR tablet 40 mEq (40 mEq Oral Given 12/09/22 0510)  cefTRIAXone (ROCEPHIN) 1 g in sodium chloride 0.9 % 100 mL IVPB (1 g Intravenous New Bag/Given 12/09/22 0614)     IMPRESSION / MDM / [place] / ED COURSE  I reviewed the triage vital signs and the nursing notes.                             88 year old female presenting with respiratory distress with hypoxia. Differential includes, but is not limited to, viral syndrome, bronchitis including COPD exacerbation, pneumonia, reactive airway disease including asthma, CHF including exacerbation with or without pulmonary/interstitial edema, pneumothorax, ACS, thoracic trauma, and pulmonary embolism.  I have personally reviewed patient's records and note a pulmonology office visit on 11/28/2022 for chronic cough.  It is noted that patient had COVID-19 infection back in mid November of this year.  Patient reports history of asthma but I do not see this documented  in her problems list.  Patient's presentation is most consistent with acute presentation with potential threat to life or bodily function.  The patient is on the cardiac monitor to evaluate for evidence of arrhythmia and/or significant heart rate changes.  Will obtain basic lab work, chest x-ray.  Administer 125 mg IV Solu-Medrol, respiratory panel.   Will reassess.  Clinical Course as of 12/09/22 9191  [facility] Dec 09, 2022  0449 Remains tachypneic, complains of chronic low back pain.  Will administer tramadol, administer IV diuretic, nebulizer treatment and reassess. [JS]  [id number] Chest x-ray demonstrates clear pneumonia.  Will add blood cultures and lactate.  Updated patient and family members of all test results thus far including moderate leukocytosis with WBC 16, mild hypokalemia potassium 3.1, normal troponin, mildly elevated BNP, negative respiratory panel.  Will administer IV Rocephin and Azithromycin.  Given patient's new oxygen requirement, will consult hospitalist services for evaluation and admission. [JS]  6606 Lactic acid is 2.9.  Initiated ED sepsis protocol with 30 cc/kilo IV fluids.  Patient already received IV Rocephin and azithromycin for bronchopneumonia. [JS]    Clinical Course User Index [JS] Paulette Blanch, MD     FINAL CLINICAL IMPRESSION(S) / ED DIAGNOSES   Final diagnoses:  Respiratory distress  Moderate asthma with acute exacerbation, unspecified whether persistent  Hypokalemia  Acute on chronic congestive heart failure, unspecified heart failure type ([place])  Community acquired pneumonia, unspecified laterality  Hypoxia  Sepsis, due to unspecified organism, unspecified whether acute organ dysfunction present [facility])     Rx / DC Orders   ED Discharge Orders     None        Note:  This document was prepared using Dragon voice recognition software and may include unintentional dictation errors.   Paulette Blanch, MD 12/09/22 0045    Paulette Blanch, MD 12/09/22 [phone]

## 2022-12-10 ENCOUNTER — Other Ambulatory Visit: Payer: Self-pay

## 2022-12-10 ENCOUNTER — Inpatient Hospital Stay: Payer: Medicare PPO

## 2022-12-10 ENCOUNTER — Encounter: Payer: Self-pay | Admitting: Internal Medicine

## 2022-12-10 DIAGNOSIS — J189 Pneumonia, unspecified organism: Secondary | ICD-10-CM | POA: Diagnosis not present

## 2022-12-10 DIAGNOSIS — I5032 Chronic diastolic (congestive) heart failure: Secondary | ICD-10-CM | POA: Diagnosis not present

## 2022-12-10 DIAGNOSIS — E039 Hypothyroidism, unspecified: Secondary | ICD-10-CM

## 2022-12-10 DIAGNOSIS — E1142 Type 2 diabetes mellitus with diabetic polyneuropathy: Secondary | ICD-10-CM | POA: Diagnosis not present

## 2022-12-10 LAB — CBC
HCT: 35 % — ABNORMAL LOW (ref 36.0–46.0)
Hemoglobin: 11 g/dL — ABNORMAL LOW (ref 12.0–15.0)
MCH: 27.3 pg (ref 26.0–34.0)
MCHC: 31.4 g/dL (ref 30.0–36.0)
MCV: 86.8 fL (ref 80.0–100.0)
Platelets: 321 10*3/uL (ref 150–400)
RBC: 4.03 MIL/uL (ref 3.87–5.11)
RDW: 15.8 % — ABNORMAL HIGH (ref 11.5–15.5)
WBC: 21.3 10*3/uL — ABNORMAL HIGH (ref 4.0–10.5)
nRBC: 0 % (ref 0.0–0.2)

## 2022-12-10 LAB — STREP PNEUMONIAE URINARY ANTIGEN: Strep Pneumo Urinary Antigen: NEGATIVE

## 2022-12-10 LAB — BASIC METABOLIC PANEL
Anion gap: 9 (ref 5–15)
BUN: 20 mg/dL (ref 8–23)
CO2: 22 mmol/L (ref 22–32)
Calcium: 9 mg/dL (ref 8.9–10.3)
Chloride: 106 mmol/L (ref 98–111)
Creatinine, Ser: 0.82 mg/dL (ref 0.44–1.00)
GFR, Estimated: >60 mL/min (ref >60)
Glucose, Bld: 271 mg/dL — ABNORMAL HIGH (ref 70–99)
Potassium: 4.7 mmol/L (ref 3.5–5.1)
Sodium: 137 mmol/L (ref 135–145)

## 2022-12-10 LAB — MAGNESIUM: Magnesium: 2 mg/dL (ref 1.7–2.4)

## 2022-12-10 LAB — GLUCOSE, CAPILLARY
Glucose-Capillary: 239 mg/dL — ABNORMAL HIGH (ref 70–99)
Glucose-Capillary: 221 mg/dL — ABNORMAL HIGH (ref 70–99)
Glucose-Capillary: 208 mg/dL — ABNORMAL HIGH (ref 70–99)
Glucose-Capillary: 196 mg/dL — ABNORMAL HIGH (ref 70–99)
Glucose-Capillary: 238 mg/dL — ABNORMAL HIGH (ref 70–99)

## 2022-12-10 LAB — LACTIC ACID, PLASMA
Lactic Acid, Venous: 4.2 mmol/L (ref 0.5–1.9)
Lactic Acid, Venous: 3.1 mmol/L (ref 0.5–1.9)
Lactic Acid, Venous: 2.9 mmol/L (ref 0.5–1.9)

## 2022-12-10 LAB — CBG MONITORING, ED: Glucose-Capillary: 267 mg/dL — ABNORMAL HIGH (ref 70–99)

## 2022-12-10 LAB — PROCALCITONIN: Procalcitonin: <0.1 ng/mL

## 2022-12-10 MED ORDER — IOHEXOL 350 MG/ML SOLN
75.0000 mL | Freq: Once | INTRAVENOUS | Status: AC | PRN
  Administered 2022-12-10: 75 mL via INTRAVENOUS

## 2022-12-10 MED ORDER — INSULIN GLARGINE-YFGN 100 UNIT/ML ~~LOC~~ SOLN
5.0000 [IU] | Freq: Two times a day (BID) | SUBCUTANEOUS | Status: DC
  Administered 2022-12-10: 5 [IU] via SUBCUTANEOUS
  Filled 2022-12-10 (×3): qty 0.05

## 2022-12-10 MED ORDER — INSULIN ASPART 100 UNIT/ML IJ SOLN
0.0000 [IU] | Freq: Every day | INTRAMUSCULAR | Status: DC
  Administered 2022-12-10 – 2022-12-11 (×2): 2 [IU] via SUBCUTANEOUS
  Filled 2022-12-10 (×2): qty 1

## 2022-12-10 MED ORDER — INSULIN ASPART 100 UNIT/ML IJ SOLN: 0.0000 [IU] | Freq: Three times a day (TID) | INTRAMUSCULAR | Status: DC

## 2022-12-10 MED ORDER — IPRATROPIUM-ALBUTEROL 0.5-2.5 (3) MG/3ML IN SOLN
3.0000 mL | Freq: Four times a day (QID) | RESPIRATORY_TRACT | Status: DC
  Administered 2022-12-11 (×2): 3 mL via RESPIRATORY_TRACT
  Filled 2022-12-10 (×2): qty 3

## 2022-12-10 MED ORDER — INSULIN ASPART 100 UNIT/ML IJ SOLN
0.0000 [IU] | Freq: Three times a day (TID) | INTRAMUSCULAR | Status: DC
  Administered 2022-12-10: 3 [IU] via SUBCUTANEOUS
  Administered 2022-12-11: 5 [IU] via SUBCUTANEOUS
  Administered 2022-12-11: 3 [IU] via SUBCUTANEOUS
  Administered 2022-12-11: 8 [IU] via SUBCUTANEOUS
  Administered 2022-12-12 (×2): 5 [IU] via SUBCUTANEOUS
  Filled 2022-12-10 (×6): qty 1

## 2022-12-10 NOTE — Assessment & Plan Note (Signed)
-   Continue home medications 

## 2022-12-10 NOTE — Assessment & Plan Note (Signed)
-  Continue home donepezil

## 2022-12-10 NOTE — Assessment & Plan Note (Addendum)
A1c 7.6 recently, poorly controlled.  Patient is taking glipizide at home CBG elevated, patient is also on steroid -Add Semglee 5 units twice daily -Switch to moderate SSI

## 2022-12-10 NOTE — Assessment & Plan Note (Signed)
-   Continue home Synthroid °

## 2022-12-10 NOTE — Assessment & Plan Note (Signed)
Continue pravastatin 

## 2022-12-10 NOTE — Assessment & Plan Note (Signed)
Patient met SIRS criteria with leukocytosis, little worsening which can be due to steroid use.  Lactic acidosis most likely secondary to decreased oxygenation and hypoxia.  No obvious source yet so sepsis currently not ruled in. Procalcitonin and blood cultures negative so far. -Discontinue ceftriaxone -Continue to monitor

## 2022-12-10 NOTE — Consult Note (Signed)
   Heart Failure Nurse Navigator Note  HFmrEF 45-50%.  Mild tricuspid regurgitation.  Presented to the emergency room with complaints of shortness of breath, cough and wheezing progressively worsening for several days.  BNP was 407.  Comorbidities:  Hypertension Hyperlipidemia Diabetes GERD Hypothyroidism Obstructive sleep apnea not on CPAP Peripheral vascular disease Mild pulmonary hypertension  Medications:  Amiodarone 200 mg daily Apixaban 5 mg 2 times a day Plavix 75 mg daily Diltiazem 240 mg daily Furosemide 40 mg IV every 12 Isosorbide mononitrate 30 mg Levothyroxine 88 mcg daily Metoprolol succinate 100 mg daily Pravastatin 20 mg at bedtime  Labs:  Sodium 137, potassium 4.7, chloride 106, CO2 22, BUN 20, creatinine 0.82, GFR greater than 60, magnesium 2, procalcitonin less than 0.10 8 not documented Intake not documented Output 2400 mL  Initial meeting with patient, her daughter [name] was at the bedside.  She states that she resides in an independent living facility and usually fixes her own breakfast but will go to the dining hall for the main meal and then eat the leftovers for her evening meal.  She states that she weighs herself morning and night.  Explained that the morning weight is the 1 that we are interested in and if she sees a 2 pound weight gain overnight or 5 pounds total within the week she needs to be calling her providers.  She states that she does not salt her food at the table.  She did not feel she drank over 64 ounces in a days time.  She states that she is compliant with her medication, just and knew that her water pill was torsemide.  Discussed follow-up in the outpatient heart failure clinic for which she has an appointment on January 4 at 1 PM.  She has a 15% no-show ratio which is 10 out of 66 appointments.  Made patient's daughter aware that patient did not show for her last heart failure clinic visit.  Daughter asked that we call her  with the appointments.  Will make the heart failure clinic aware.  They had no further questions at this time.  Pricilla Riffle RN [facility]

## 2022-12-10 NOTE — ED Notes (Signed)
Helped patient to bedside commode to have a BM. Patient did not have a BM but has been passing gas. Was going to do a bed bath but patient stated she could not tolerate it at this time from after getting up. Patient helped back to bed, call light in reach.

## 2022-12-10 NOTE — Assessment & Plan Note (Signed)
No current chest pain. -Continue home Plavix, pravastatin and Imdur -As needed nitroglycerin

## 2022-12-10 NOTE — Assessment & Plan Note (Signed)
2D echo on 10/31/2021 showed EF of 45 to 50%. Patient has trace leg edema, elevated BNP 407, at risk of developing CHF exacerbation. Patient received 2.5 L normal saline bolus in ED. patient received  Bumex in ED and and started on IV Lasix. Some worsening of lactic acidosis and patient clinically appears euvolemic. -Discontinue IV Lasix -Monitor volume status closely

## 2022-12-10 NOTE — Progress Notes (Signed)
Progress Note   Patient: Alison Richardson AJG:[id number] DOB: [birth year] DOA: 12/09/2022     1 DOS: the patient was seen and examined on 12/10/2022   Brief hospital course: Taken from H&P.  Alison Richardson is a 88 y.o. female with medical history significant of hypertension, hyperlipidemia, diabetes mellitus, GERD, hypothyroidism, depression, anxiety, OSA not on CPAP, PVD, mild pulmonary hypertension, Alzheimer disease, systolic CHF, atrial fibrillation on Eliquis, who presents with shortness breath.   Patient states that she had COVID infection about 4 weeks ago.  In the past several days, she developed shortness breath, cough, wheezing which has been progressively worsening.  She coughs up clear mucus, no fever or chills.  She has a chest discomfort.  No nausea vomiting, diarrhea or abdominal pain with no symptoms of UTI.Patient states that she is taking Eliquis consistently, last dose was last night.   Data reviewed independently and ED Course: pt was found to have troponin level 8 --> 9, WBC 16.1, lactic acid 2.9, BNP 407, negative COVID PCR and flu, potassium 3.1, renal function okay, temperature normal, blood pressure 168/10 3, heart rate of 109, RR 20, oxygen saturation 89% on room air, which improved to 94% on 2 L oxygen (patient does not use oxygen at home).  Chest x-ray with multifocal infiltration and bronchitis change.  EKG:  Atrial fibrillation, QTc 514, LAD, low voltage, poor R wave progression.   Patient received 2.5 L of fluid in the ED, as initial consideration was sepsis.  Due to little worsening of respiratory status she was started on IV diuresis as her BNP was elevated with history of HFrEF.  She was started on ceftriaxone and Zithromax, along with steroid and IV Lasix.  12/18: Vitals stable, saturating 95% on 3 L of oxygen, UOP of 2400 recorded.  Preliminary blood cultures negative.  Procalcitonin negative.  Strep pneumo urinary antigen negative.  Repeat lactic acid with some  worsening to 5.2>>4.2    repeat procalcitonin remain negative. Holding further IV Lasix.  Pulmonology was also consulted. Due to persistent hypoxia-we will obtain CTA to rule out PE. Most likely post-COVID pneumonitis.      Assessment and Plan: * Multifocal pneumonia Acute hypoxic respiratory failure.  Patient currently on 3 L of oxygen with no baseline oxygen use Imaging with concern of bilateral infiltrate but procalcitonin remain negative. Persistent hypoxia and elevated lactic acid most likely secondary to decreased oxygenation.  Strep pneumo negative.  No obvious bacterial infection.  COVID-19, influenza and RSV negative.  Differential at this time as PE or post COVID pneumonitis.  Although patient was on anticoagulation with Eliquis -Will obtain CTA to rule out PE and further characterization of lung lesions. -Pulmonary consult -Stop ceftriaxone -Continue with Zithromax for its anti-inflammatory effect -Check Fungitell -Continue Solu-Medrol for now -Continue with supportive care   Severe sepsis [facility]) Patient met SIRS criteria with leukocytosis, little worsening which can be due to steroid use.  Lactic acidosis most likely secondary to decreased oxygenation and hypoxia.  No obvious source yet so sepsis currently not ruled in. Procalcitonin and blood cultures negative so far. -Discontinue ceftriaxone -Continue to monitor  Chronic diastolic CHF (congestive heart failure) ([place]) 2D echo on 10/31/2021 showed EF of 45 to 50%. Patient has trace leg edema, elevated BNP 407, at risk of developing CHF exacerbation. Patient received 2.5 L normal saline bolus in ED. patient received  Bumex in ED and and started on IV Lasix. Some worsening of lactic acidosis and patient clinically appears euvolemic. -Discontinue IV Lasix -Monitor  volume status closely  Hypothyroidism -Continue home Synthroid  Type 2 diabetes mellitus with peripheral neuropathy (HCC) A1c 7.6 recently, poorly controlled.   Patient is taking glipizide at home CBG elevated, patient is also on steroid -Add Semglee 5 units twice daily -Switch to moderate SSI  Atrial fibrillation, chronic (HCC) Continue Eliquis -Metoprolol, Cardizem  HLD (hyperlipidemia) -Continue pravastatin  CAD (coronary artery disease) No current chest pain. -Continue home Plavix, pravastatin and Imdur -As needed nitroglycerin  Alzheimer disease (DISH) -Continue home donepezil  Depression with anxiety -Continue home medications    Subjective: Patient think that she is little improved than yesterday.  No baseline oxygen use.  Continued to have cough which increased with deep breathing.  Physical Exam: Vitals:   12/10/22 0805 12/10/22 0857 12/10/22 1125 12/10/22 1555  BP: (!) 145/85 (!) 143/84 128/75 128/76  Pulse: 75 70 70 65  Resp: _0 Temp:    98.4 F (36.9 C)  TempSrc:    Oral  SpO2: 95% 93%  96%  Weight:      Height:       General.  Frail elderly lady, in no acute distress. Pulmonary.  Lungs clear bilaterally, normal respiratory effort. CV.  Regular rate and rhythm, no JVD, rub or murmur. Abdomen.  Soft, nontender, nondistended, BS positive. CNS.  Alert and oriented .  No focal neurologic deficit. Extremities.  No edema, no cyanosis, pulses intact and symmetrical. Psychiatry.  Judgment and insight appears normal.   Data Reviewed: Prior data reviewed  Family Communication: Discussed with sister at bedside  Disposition: Status is: Inpatient Remains inpatient appropriate because: Severity of illness  Planned Discharge Destination: Home  DVT prophylaxis.  Eliquis Time spent: 50 minutes  This record has been created using Systems analyst. Errors have been sought and corrected,but may not always be located. Such creation errors do not reflect on the standard of care.   Author: Lorella Nimrod, MD 12/10/2022 4:37 PM  For on call review [email or web address].

## 2022-12-10 NOTE — Hospital Course (Addendum)
Taken from H&P.  Alison Richardson is a 88 y.o. female with medical history significant of hypertension, hyperlipidemia, diabetes mellitus, GERD, hypothyroidism, depression, anxiety, OSA not on CPAP, PVD, mild pulmonary hypertension, Alzheimer disease, systolic CHF, atrial fibrillation on Eliquis, who presents with shortness breath.   Patient states that she had COVID infection about 4 weeks ago.  In the past several days, she developed shortness breath, cough, wheezing which has been progressively worsening.  She coughs up clear mucus, no fever or chills.  She has a chest discomfort.  No nausea vomiting, diarrhea or abdominal pain with no symptoms of UTI.Patient states that she is taking Eliquis consistently, last dose was last night.   Data reviewed independently and ED Course: pt was found to have troponin level 8 --> 9, WBC 16.1, lactic acid 2.9, BNP 407, negative COVID PCR and flu, potassium 3.1, renal function okay, temperature normal, blood pressure 168/10 3, heart rate of 109, RR 20, oxygen saturation 89% on room air, which improved to 94% on 2 L oxygen (patient does not use oxygen at home).  Chest x-ray with multifocal infiltration and bronchitis change.  EKG:  Atrial fibrillation, QTc 514, LAD, low voltage, poor R wave progression.   Patient received 2.5 L of fluid in the ED, as initial consideration was sepsis.  Due to little worsening of respiratory status she was started on IV diuresis as her BNP was elevated with history of HFrEF.  She was started on ceftriaxone and Zithromax, along with steroid and IV Lasix.  12/18: Vitals stable, saturating 95% on 3 L of oxygen, UOP of 2400 recorded.  Preliminary blood cultures negative.  Procalcitonin negative.  Strep pneumo urinary antigen negative.  Repeat lactic acid with some worsening to 5.2>>4.2    repeat procalcitonin remain negative. Holding further IV Lasix.  Pulmonology was also consulted. Due to persistent hypoxia-we will obtain CTA to rule out  PE. Most likely post-COVID pneumonitis.  12/19: Vitals stable.  Able to wean off from oxygen.  CTA with no PE, did show mild to moderate bilateral pleural effusions with adjacent atelectasis of both lower lobes.  Also noted mild bilateral upper lobe airspace opacities concerning for possible pneumonia, right greater than left.  A moderate size sliding type hiatal hernia.  Echocardiogram pending Labs with little worsening of BNP at 521, ESR normal at 10, mild hyperkalemia with potassium at 5.4, improving leukocytosis at 17.3, stable hemoglobin around 10.8.  Blood glucose 235-increasing Semglee to 8 units twice daily and adding 4 units with meal. Adding daily Lasix 40 mg IV.  We will continue with IV diuresis for another day. Persistent lactic acidosis-can be due to albuterol or DuoNeb.  Discussed with pulmonology and they are advising to stop steroid and breathing treatments.  12/20: Hemodynamically stable.  Echocardiogram with low normal EF and no diastolic dysfunction.  Essentially normal.  UOP of 2500 with net negative of -6450 recorded. Patient remained stable on room air, saturation remained in mid 90s at rest and with ambulation.  Does not need oxygen at this time.   Cardiology is recommending continuation of home dose of torsemide and also added low-dose spironolactone and they will follow-up as an outpatient for further recommendations. We also increase the dose of glipizide and added Farxiga to help with her elevated blood glucose levels.  She need to follow-up with her PCP very closely for further management of her diabetes.  She will continue on current medications and need to have a close follow-up with her providers for further  recommendations.

## 2022-12-10 NOTE — Plan of Care (Signed)
Received consultation request on this patient.  She has currently on her way to CT angio chest requested.  Patient has been previously evaluated by Dr. Armando Reichert on 6 December for shortness of breath and chronic cough.  For the details of that evaluation please refer to that note.  I have perused the chart and have ordered additional studies that may help in evaluating her hypoxemia.  Additional studies ordered have been a echocardiogram (patient has known ischemic cardiomyopathy) to exclude potential myocardial injury due to recent COVID.  Have also ordered sed rate, CRP and BNP to be repeated in the morning.  Studies along with the CT angio will be crucial rendering accurate diagnosis.  I will notify Dr. Genia Harold who will be on consult service tomorrow to follow-up on this patient and her evaluation.  Renold Don, MD Advanced Bronchoscopy PCCM [facility]

## 2022-12-10 NOTE — Assessment & Plan Note (Signed)
Continue Eliquis -Metoprolol, Cardizem

## 2022-12-10 NOTE — Inpatient Diabetes Management (Signed)
Inpatient Diabetes Program Recommendations  AACE/ADA: New Consensus Statement on Inpatient Glycemic Control (2015)  Target Ranges:  Prepandial:   less than 140 mg/dL      Peak postprandial:   less than 180 mg/dL (1-2 hours)      Critically ill patients:  140 - 180 mg/dL   Lab Results  Component Value Date   GLUCAP 239 (H) 12/10/2022   HGBA1C 7.6 (H) 10/15/2022    Latest Reference Range & Units 12/09/22 08:03 12/09/22 12:24 12/09/22 16:25 12/09/22 21:08 12/10/22 07:43 12/10/22 08:59  Glucose-Capillary 70 - 99 mg/dL 271 (H) 301 (H) 224 (H) 248 (H) 267 (H) 239 (H)  (H): Data is abnormally high  Diabetes history: DM2 Outpatient Diabetes medications: Glucotrol 2.5 mg Current orders for Inpatient glycemic control: Novolog 0-9 units tid correction + Prednisone 40 mg qd  Inpatient Diabetes Program Recommendations:   While on steroids and Glucotrol on hold, please consider: -Change diet to carb modified if appropriate. -Add Levemir 7 units q d (0.1 units/kg x 77.1 kg) -Add Novolog 0-5 units correction hs  Thank you, Bethena Roys E. Kinberly Perris, RN, MSN, CDE  Diabetes Coordinator Inpatient Glycemic Control Team Team Pager [phone] (8am-5pm) 12/10/2022 11:44 AM

## 2022-12-10 NOTE — Assessment & Plan Note (Signed)
Acute hypoxic respiratory failure.  Patient currently on 3 L of oxygen with no baseline oxygen use Imaging with concern of bilateral infiltrate but procalcitonin remain negative. Persistent hypoxia and elevated lactic acid most likely secondary to decreased oxygenation.  Strep pneumo negative.  No obvious bacterial infection.  COVID-19, influenza and RSV negative.  Differential at this time as PE or post COVID pneumonitis.  Although patient was on anticoagulation with Eliquis -Will obtain CTA to rule out PE and further characterization of lung lesions. -Pulmonary consult -Stop ceftriaxone -Continue with Zithromax for its anti-inflammatory effect -Check Fungitell -Continue Solu-Medrol for now -Continue with supportive care

## 2022-12-11 ENCOUNTER — Inpatient Hospital Stay
Admit: 2022-12-11 | Discharge: 2022-12-11 | Disposition: A | Discharge status: Home / Self Care | Payer: Medicare PPO | Attending: Pulmonary Disease | Admitting: Pulmonary Disease

## 2022-12-11 DIAGNOSIS — I482 Chronic atrial fibrillation, unspecified: Secondary | ICD-10-CM | POA: Diagnosis not present

## 2022-12-11 DIAGNOSIS — E039 Hypothyroidism, unspecified: Secondary | ICD-10-CM | POA: Diagnosis not present

## 2022-12-11 DIAGNOSIS — J189 Pneumonia, unspecified organism: Secondary | ICD-10-CM | POA: Diagnosis not present

## 2022-12-11 DIAGNOSIS — I5032 Chronic diastolic (congestive) heart failure: Secondary | ICD-10-CM | POA: Diagnosis not present

## 2022-12-11 LAB — BASIC METABOLIC PANEL
Anion gap: 5 (ref 5–15)
BUN: 23 mg/dL (ref 8–23)
CO2: 27 mmol/L (ref 22–32)
Calcium: 8.9 mg/dL (ref 8.9–10.3)
Chloride: 108 mmol/L (ref 98–111)
Creatinine, Ser: 0.89 mg/dL (ref 0.44–1.00)
GFR, Estimated: >60 mL/min (ref >60)
Glucose, Bld: 235 mg/dL — ABNORMAL HIGH (ref 70–99)
Potassium: 5.4 mmol/L — ABNORMAL HIGH (ref 3.5–5.1)
Sodium: 140 mmol/L (ref 135–145)

## 2022-12-11 LAB — ECHOCARDIOGRAM COMPLETE
AR max vel: 1.93 cm2
AV Area VTI: 1.91 cm2
AV Area mean vel: 1.94 cm2
AV Mean grad: 2 mmHg
AV Peak grad: 4.2 mmHg
Ao pk vel: 1.03 m/s
Area-P 1/2: 6.27 cm2
Height: 66 in
S' Lateral: 3.8 cm
Weight: 3449.76 oz

## 2022-12-11 LAB — LEGIONELLA PNEUMOPHILA SEROGP 1 UR AG: L. pneumophila Serogp 1 Ur Ag: NEGATIVE

## 2022-12-11 LAB — CBC
HCT: 34.3 % — ABNORMAL LOW (ref 36.0–46.0)
Hemoglobin: 10.8 g/dL — ABNORMAL LOW (ref 12.0–15.0)
MCH: 27.6 pg (ref 26.0–34.0)
MCHC: 31.5 g/dL (ref 30.0–36.0)
MCV: 87.7 fL (ref 80.0–100.0)
Platelets: 313 10*3/uL (ref 150–400)
RBC: 3.91 MIL/uL (ref 3.87–5.11)
RDW: 15.9 % — ABNORMAL HIGH (ref 11.5–15.5)
WBC: 17.3 10*3/uL — ABNORMAL HIGH (ref 4.0–10.5)
nRBC: 0 % (ref 0.0–0.2)

## 2022-12-11 LAB — GLUCOSE, CAPILLARY
Glucose-Capillary: 240 mg/dL — ABNORMAL HIGH (ref 70–99)
Glucose-Capillary: 253 mg/dL — ABNORMAL HIGH (ref 70–99)
Glucose-Capillary: 155 mg/dL — ABNORMAL HIGH (ref 70–99)
Glucose-Capillary: 217 mg/dL — ABNORMAL HIGH (ref 70–99)

## 2022-12-11 LAB — LACTIC ACID, PLASMA: Lactic Acid, Venous: 3.1 mmol/L (ref 0.5–1.9)

## 2022-12-11 LAB — C-REACTIVE PROTEIN: CRP: 1.9 mg/dL — ABNORMAL HIGH (ref <1.0)

## 2022-12-11 LAB — SEDIMENTATION RATE: Sed Rate: 10 mm/hr (ref 0–30)

## 2022-12-11 LAB — BRAIN NATRIURETIC PEPTIDE: B Natriuretic Peptide: 521.5 pg/mL — ABNORMAL HIGH (ref 0.0–100.0)

## 2022-12-11 MED ORDER — INSULIN ASPART 100 UNIT/ML IJ SOLN
4.0000 [IU] | Freq: Three times a day (TID) | INTRAMUSCULAR | Status: DC
  Administered 2022-12-11 – 2022-12-12 (×3): 4 [IU] via SUBCUTANEOUS
  Filled 2022-12-11 (×3): qty 1

## 2022-12-11 MED ORDER — IPRATROPIUM-ALBUTEROL 0.5-2.5 (3) MG/3ML IN SOLN: 3.0000 mL | Freq: Two times a day (BID) | RESPIRATORY_TRACT | Status: DC

## 2022-12-11 MED ORDER — FUROSEMIDE 10 MG/ML IJ SOLN
40.0000 mg | Freq: Every day | INTRAMUSCULAR | Status: DC
  Administered 2022-12-11 – 2022-12-12 (×2): 40 mg via INTRAVENOUS
  Filled 2022-12-11 (×2): qty 4

## 2022-12-11 MED ORDER — INSULIN GLARGINE-YFGN 100 UNIT/ML ~~LOC~~ SOLN
8.0000 [IU] | Freq: Two times a day (BID) | SUBCUTANEOUS | Status: DC
  Administered 2022-12-11 – 2022-12-12 (×2): 8 [IU] via SUBCUTANEOUS
  Filled 2022-12-11 (×2): qty 0.08

## 2022-12-11 NOTE — Assessment & Plan Note (Signed)
Patient met SIRS criteria with leukocytosis, little worsening which can be due to steroid use.  Lactic acidosis most likely secondary to decreased oxygenation and hypoxia.  No obvious source yet so sepsis currently not ruled in. Sepsis ruled out. Procalcitonin and blood cultures negative so far. -Discontinue ceftriaxone -Continue to monitor

## 2022-12-11 NOTE — Consult Note (Signed)
NAME:  Alison Richardson, MRN:  [id number], DOB:  February 26, 1933, LOS: 2 ADMISSION DATE:  12/09/2022, CHIEF COMPLAINT:  Shortness of breath   History of Present Illness:   Alison Richardson is a pleasant 88 year old female who had presented to clinic for the evaluation of cough and shortness of breath and now is admitted to the hospital with worsening shortness of breath.  She is now presenting to the hospital with worsening shortness of breath and lower extremity edema associated with hypoxia.  The symptoms had progressed over the past 2 weeks up until she was short of breath at rest.  This prompted EMS activation at her independent living facility where she was found to be hypoxic at 89%.  In the ED, blood work was notable for an elevated BNP of 521 with a negative procalcitonin.  She underwent a CT scan of the chest with PE protocol that was negative for clot.  The CT was notable for bilateral pleural effusions and bilateral pulmonary edema on my evaluation.  She underwent IV diuresis with furosemide and her breathing status improved.    She is now off oxygen support and is resting comfortably in bed.  She denies any chest pain or chest tightness.  Echocardiogram today showing an EF of 50%, low normal function, with no regional wall motion abnormalities.  RV function was normal. She tells me that she tries to avoid salt in her diet. Most of her meals come from the cafeteria at her facility, and she thinks they might be using some canned foods.  Pertinent  Medical History  CAD HFpEF Hypertension Diabetes Atrial fibrillation Hypothyroidism   Objective   Blood pressure (!) 136/116, pulse 65, temperature 98.6 F (37 C), resp. rate 16, height '5\' 6"'$  (1.676 m), weight 97.8 kg, SpO2 93 %.        Intake/Output Summary (Last 24 hours) at 12/11/2022 1530 Last data filed at 12/11/2022 1438 Gross per 24 hour  Intake 600 ml  Output 2900 ml  Net -2300 ml   Filed Weights   12/09/22 0358 12/11/22 0338   Weight: 77.1 kg 97.8 kg    Examination: Physical Exam Constitutional:      General: She is not in acute distress.    Appearance: She is well-developed. She is not ill-appearing.  HENT:     Head: Normocephalic.  Eyes:     Pupils: Pupils are equal, round, and reactive to light.  Cardiovascular:     Rate and Rhythm: Normal rate and regular rhythm.     Pulses: Normal pulses.     Heart sounds: Normal heart sounds.  Pulmonary:     Breath sounds: Rales present.  Abdominal:     Palpations: Abdomen is soft.  Musculoskeletal:     Cervical back: Normal range of motion.  Skin:    General: Skin is warm.  Neurological:     General: No focal deficit present.     Mental Status: She is alert and oriented to person, place, and time. Mental status is at baseline.     Assessment & Plan:   #Acute hypoxic respiratory failure #Acute Decompensated HFpEF  Patient is an 88 year old female with a past medical history of CAD, A-fib, and HFpEF who presents to the hospital with hypoxic respiratory failure and findings of pulmonary edema and bilateral pleural effusions on CT scan consistent with decompensated heart failure.  She has improved with IV diuresis and would benefit from further evaluation with cardiology.  Specifically, the patient could benefit from optimal HFpEF therapy  with SGLT2 inhibitors, spironolactone, and continued loop diuretics.  While the patient was seen in clinic and consideration was given for reactive airway disease, I do not believe this is a process driving her symptoms. The findings on the CT are very much diagnostic of decompensated heart failure. She will continue using her Symbicort after discharge and follow up with me in clinic.  -recommend cardiology consult -continue IV diuresis   Labs   CBC: Recent Labs  Lab 12/09/22 0405 12/10/22 0931 12/11/22 0443  WBC 16.1* 21.3* 17.3*  NEUTROABS 12.8*  --   --   HGB 12.9 11.0* 10.8*  HCT 41.1 35.0* 34.3*  MCV 86.5  86.8 87.7  PLT 306 321 287    Basic Metabolic Panel: Recent Labs  Lab 12/09/22 0405 12/10/22 0523 12/11/22 0443  NA 141 137 140  K 3.1* 4.7 5.4*  CL 109 106 108  CO2 '24 22 27  '$ GLUCOSE 210* 271* 235*  BUN '14 20 23  '$ CREATININE 0.65 0.82 0.89  CALCIUM 9.0 9.0 8.9  MG 2.2 2.0  --    GFR: Estimated Creatinine Clearance: 52.5 mL/min (by C-G formula based on SCr of 0.89 mg/dL). Recent Labs  Lab 12/09/22 0405 12/09/22 0546 12/09/22 0554 12/09/22 1642 12/10/22 0523 12/10/22 0931 12/10/22 1753 12/10/22 2012 12/11/22 0443  PROCALCITON  --   --  <0.10  --  <0.10  --   --   --   --   WBC 16.1*  --   --   --   --  21.3*  --   --  17.3*  LATICACIDVEN  --    < >  --  5.2*  --  4.2* 3.1* 2.9*  --    < > = values in this interval not displayed.    Liver Function Tests: Recent Labs  Lab 12/09/22 0405  AST 63*  ALT 55*  ALKPHOS 113  BILITOT 1.1  PROT 7.8  ALBUMIN 4.3   No results for input(s): "LIPASE", "AMYLASE" in the last 168 hours. No results for input(s): "AMMONIA" in the last 168 hours.  ABG No results found for: "PHART", "PCO2ART", "PO2ART", "HCO3", "TCO2", "ACIDBASEDEF", "O2SAT"   Coagulation Profile: No results for input(s): "INR", "PROTIME" in the last 168 hours.  Cardiac Enzymes: No results for input(s): "CKTOTAL", "CKMB", "CKMBINDEX", "TROPONINI" in the last 168 hours.  HbA1C: Hgb A1c MFr Bld  Date/Time Value Ref Range Status  10/15/2022 02:36 PM 7.6 (H) 4.8 - 5.6 % Final    Comment:             Prediabetes: 5.7 - 6.4          Diabetes: >6.4          Glycemic control for adults with diabetes: <7.0   07/12/2022 02:48 PM 7.1 (H) 4.8 - 5.6 % Final    Comment:             Prediabetes: 5.7 - 6.4          Diabetes: >6.4          Glycemic control for adults with diabetes: <7.0     CBG: Recent Labs  Lab 12/10/22 1553 12/10/22 1822 12/10/22 2055 12/11/22 0826 12/11/22 1206  GLUCAP 208* 196* 238* 240* 253*    Review of Systems:   Review of  Systems  Constitutional:  Negative for chills, fever and weight loss.  Respiratory:  Positive for shortness of breath. Negative for cough and wheezing.   Cardiovascular:  Negative for chest pain and  palpitations.  Skin:  Negative for rash.  Neurological:  Negative for dizziness.    Past Medical History:  She,  has a past medical history of Arrhythmia, Atrial flutter ([place]) (01/2018), Basal cell carcinoma of back, Basal cell carcinoma of lip, Cerebral aneurysm, CHF (congestive heart failure) ([place]), DDD (degenerative disc disease), lumbar, Diabetes mellitus type II, controlled ([place]), Diverticulosis, Dysrhythmia, Dysthymia, GERD (gastroesophageal reflux disease), History of meniscal tear, Hyperlipidemia, Hypertension, Hypothyroidism, Late onset Alzheimer's disease with behavioral disturbance ([place]), Mild pulmonary hypertension ([place]), Overactive bladder, Peripheral vascular disease ([place]), Seasonal allergic rhinitis, and Sleep apnea.   Surgical History:   Past Surgical History:  Procedure Laterality Date   APPENDECTOMY  1946   BASAL CELL CARCINOMA EXCISION     2006 and 2009 removed from back and lip   CARDIOVASCULAR STRESS TEST  2015   nuclear cardiac stress test negative for ischemia - Dr. Satira Sark   CATARACT EXTRACTION, BILATERAL  2006   COLONOSCOPY  2014   COLONOSCOPY N/A 08/19/2020   Procedure: COLONOSCOPY;  Surgeon: Lesly Rubenstein, MD;  Location: [facility] ENDOSCOPY;  Service: Endoscopy;  Laterality: N/A;   ECTOPIC PREGNANCY SURGERY  1957   ESOPHAGOGASTRODUODENOSCOPY N/A 08/19/2020   Procedure: ESOPHAGOGASTRODUODENOSCOPY (EGD);  Surgeon: Lesly Rubenstein, MD;  Location[facility] END[facility];  Service: Endoscopy;  Laterality: N/A;   INJECTION KNEE Left 05/2015   LEFT HEART CATH AND CORONARY ANGIOGRAPHY N/A 10/12/2021   Procedure: LEFT HEART CATH AND CORONARY ANGIOGRAPHY;  Surgeon: Corey Skains, MD;  Location: [place] [facility] LAB;  Service: Cardiovascular;  Laterality: N/A;   REPLACEMENT  TOTAL KNEE Left 09/06/2016   Dr. Barnet Pall   TONSILLECTOMY AND ADENOIDECTOMY  1953   TOTAL ABDOMINAL HYSTERECTOMY  1986   [facility] B     Social History:   reports that she has never smoked. She has never used smokeless tobacco. She reports current alcohol use of about 3.0 standard drinks of alcohol per week. She reports that she does not use drugs.   Family History:  Her family history includes CAD in her father; Diabetes in her paternal uncle and sister; Heart disease in her father and sister; Hypertension in her mother; Sleep apnea in her son.   Allergies Allergies  Allergen Reactions   Pantoprazole Nausea And Vomiting   Metformin And Related Diarrhea     Home Medications  Prior to Admission medications   Medication Sig Start Date End Date Taking? Authorizing Provider  amiodarone (PACERONE) 200 MG tablet Take 200 mg by mouth daily.  01/02/19  Yes [provider]  budesonide-formoterol (SYMBICORT) 160-4.5 MCG/ACT inhaler Inhale 2 puffs into the lungs 2 (two) times daily. 11/28/22  Yes Kherington Meraz, Berdine Addison, MD  clopidogrel (PLAVIX) 75 MG tablet Take 75 mg by mouth daily.   Yes [provider]  desloratadine (CLARINEX) 5 MG tablet Take 1 tablet (5 mg total) by mouth daily. 11/28/22 11/28/23 Yes Pranav Lince, Berdine Addison, MD  diltiazem (CARDIZEM CD) 240 MG 24 hr capsule Take 240 mg by mouth daily. 11/23/22  Yes [provider]  donepezil (ARICEPT) 10 MG tablet TAKE ONE TABLET (10 MG) BY MOUTH AT BEDTIME 09/19/22  Yes Jon Billings, NP  ELIQUIS 5 MG TABS tablet Take 5 mg by mouth 2 (two) times daily.  05/06/20  Yes [provider]  famotidine (PEPCID) 20 MG tablet TAKE 1 TABLET BY MOUTH ONCE DAILY 10/24/22  Yes Jon Billings, NP  fluticasone (FLONASE) 50 MCG/ACT nasal spray Place 1 spray into both nostrils daily. 11/28/22 11/28/23 Yes Armando Reichert, MD  glipiZIDE (GLUCOTROL XL) 2.5 MG 24 hr tablet TAKE ONE TABLET BY MOUTH ONCE DAILY 09/19/22  Yes Jon Billings, NP   isosorbide mononitrate (IMDUR) 30 MG 24 hr tablet Take 30 mg by mouth daily.   Yes [provider]  levothyroxine (SYNTHROID) 88 MCG tablet Take 1 tablet (88 mcg total) by mouth daily. 07/17/22  Yes Jon Billings, NP  metoprolol succinate (TOPROL-XL) 100 MG 24 hr tablet TAKE ONE TABLET (100 MG) BY MOUTH EVERY DAY (TAKE WITH OR IMMEDIATELY AFTER A MEAL) 10/24/22  Yes Jon Billings, NP  omeprazole (PRILOSEC) 20 MG capsule Take 1 capsule (20 mg total) by mouth 2 (two) times daily before a meal. 09/10/22  Yes Jon Billings, NP  pravastatin (PRAVACHOL) 20 MG tablet Take 20 mg by mouth at bedtime. 07/05/20  Yes [provider]  torsemide (DEMADEX) 20 MG tablet Take 1 tablet (20 mg total) by mouth daily. 11/03/21  Yes Richarda Osmond, MD  venlafaxine XR (EFFEXOR-XR) 75 MG 24 hr capsule TAKE 1 CAPSULE BY MOUTH ONCE DAILY. 09/19/22  Yes Jon Billings, NP  albuterol (VENTOLIN HFA) 108 (90 Base) MCG/ACT inhaler INHALE 2 PUFFS INTO THE LUNGS EVERY 6 HOURS AS NEEDED FOR WHEEZING OR SHORTNESS OF BREATH 08/29/22   Jon Billings, NP  gabapentin (NEURONTIN) 100 MG capsule Take 100 mg by mouth daily. 05/06/20   [provider]  nitroGLYCERIN (NITROSTAT) 0.4 MG SL tablet Place 0.4 mg under the tongue every 5 (five) minutes as needed for chest pain.    [provider]  oxyCODONE (OXY IR/ROXICODONE) 5 MG immediate release tablet Take 1 tablet (5 mg total) by mouth every 4 (four) hours as needed for severe pain or breakthrough pain. Patient not taking: Reported on 12/09/2022 05/17/22   Dwyane Dee, MD     Total care time: 90 minutes

## 2022-12-11 NOTE — Progress Notes (Signed)
Progress Note   Patient: Alison Richardson QBH:[id number] DOB: [birth year] DOA: 12/09/2022     2 DOS: the patient was seen and examined on 12/11/2022   Brief hospital course: Taken from H&P.  Alison Richardson is a 88 y.o. female with medical history significant of hypertension, hyperlipidemia, diabetes mellitus, GERD, hypothyroidism, depression, anxiety, OSA not on CPAP, PVD, mild pulmonary hypertension, Alzheimer disease, systolic CHF, atrial fibrillation on Eliquis, who presents with shortness breath.   Patient states that she had COVID infection about 4 weeks ago.  In the past several days, she developed shortness breath, cough, wheezing which has been progressively worsening.  She coughs up clear mucus, no fever or chills.  She has a chest discomfort.  No nausea vomiting, diarrhea or abdominal pain with no symptoms of UTI.Patient states that she is taking Eliquis consistently, last dose was last night.   Data reviewed independently and ED Course: pt was found to have troponin level 8 --> 9, WBC 16.1, lactic acid 2.9, BNP 407, negative COVID PCR and flu, potassium 3.1, renal function okay, temperature normal, blood pressure 168/10 3, heart rate of 109, RR 20, oxygen saturation 89% on room air, which improved to 94% on 2 L oxygen (patient does not use oxygen at home).  Chest x-ray with multifocal infiltration and bronchitis change.  EKG:  Atrial fibrillation, QTc 514, LAD, low voltage, poor R wave progression.   Patient received 2.5 L of fluid in the ED, as initial consideration was sepsis.  Due to little worsening of respiratory status she was started on IV diuresis as her BNP was elevated with history of HFrEF.  She was started on ceftriaxone and Zithromax, along with steroid and IV Lasix.  12/18: Vitals stable, saturating 95% on 3 L of oxygen, UOP of 2400 recorded.  Preliminary blood cultures negative.  Procalcitonin negative.  Strep pneumo urinary antigen negative.  Repeat lactic acid with some  worsening to 5.2>>4.2    repeat procalcitonin remain negative. Holding further IV Lasix.  Pulmonology was also consulted. Due to persistent hypoxia-we will obtain CTA to rule out PE. Most likely post-COVID pneumonitis.  12/19: Vitals stable.  Able to wean off from oxygen.  CTA with no PE, did show mild to moderate bilateral pleural effusions with adjacent atelectasis of both lower lobes.  Also noted mild bilateral upper lobe airspace opacities concerning for possible pneumonia, right greater than left.  A moderate size sliding type hiatal hernia.  Echocardiogram pending Labs with little worsening of BNP at 521, ESR normal at 10, mild hyperkalemia with potassium at 5.4, improving leukocytosis at 17.3, stable hemoglobin around 10.8.  Blood glucose 235-increasing Semglee to 8 units twice daily and adding 4 units with meal. Adding daily Lasix 40 mg IV.  We will continue with IV diuresis for another day.    Assessment and Plan: * Multifocal pneumonia Acute hypoxic respiratory failure.  Improved, able to wean off to room air. Imaging with concern of bilateral infiltrate but procalcitonin remain negative. Persistent hypoxia and elevated lactic acid most likely secondary to decreased oxygenation.  Strep pneumo negative.  No obvious bacterial infection.  COVID-19, influenza and RSV negative.  Differential at this time as PE or post COVID pneumonitis.  Although patient was on anticoagulation with Eliquis CTA was negative for PE but did show bilateral pleural effusion and small bilateral infiltrate involving upper lobes.  Ceftriaxone was discontinued yesterday -Pulmonary consult-recommending repeat echo. -Continue with Zithromax for its anti-inflammatory effect -Check Fungitell -Continue Solu-Medrol for now -Continue with supportive care  Severe sepsis [facility]) Patient met SIRS criteria with leukocytosis, little worsening which can be due to steroid use.  Lactic acidosis most likely secondary to  decreased oxygenation and hypoxia.  No obvious source yet so sepsis currently not ruled in. Sepsis ruled out. Procalcitonin and blood cultures negative so far. -Discontinue ceftriaxone -Continue to monitor  Chronic diastolic CHF (congestive heart failure) ([place]) 2D echo on 10/31/2021 showed EF of 45 to 50%. Patient has trace leg edema, elevated BNP 407, at risk of developing CHF exacerbation. Patient received 2.5 L normal saline bolus in ED. patient received  Bumex in ED and and started on IV Lasix. CT chest with concern of bilateral pleural effusion and little worsening of BNP. -Start her on IV Lasix 40 mg daily -Monitor volume status closely  Hypothyroidism -Continue home Synthroid  Type 2 diabetes mellitus with peripheral neuropathy (HCC) A1c 7.6 recently, poorly controlled.  Patient is taking glipizide at home CBG elevated, patient is also on steroid -Add Semglee 5 units twice daily -Switch to moderate SSI  Atrial fibrillation, chronic (HCC) Continue Eliquis -Metoprolol, Cardizem  HLD (hyperlipidemia) -Continue pravastatin  CAD (coronary artery disease) No current chest pain. -Continue home Plavix, pravastatin and Imdur -As needed nitroglycerin  Alzheimer disease ([place]) -Continue home donepezil  Depression with anxiety -Continue home medications    Subjective: Patient was still feeling little short of breath while talking on room air.  No significant desaturation.  Physical Exam: Vitals:   12/11/22 0551 12/11/22 0825 12/11/22 0841 12/11/22 1006  BP: 138/82 (!) 136/116    Pulse: 65 65    Resp: 18 16    Temp: (!) 97.4 F (36.3 C) 98.6 F (37 C)    TempSrc:      SpO2: 93% 94% 94% 93%  Weight:      Height:       General.  Frail elderly lady, in no acute distress. Pulmonary.  Lungs clear bilaterally, normal respiratory effort. CV.  Regular rate and rhythm, no JVD, rub or murmur. Abdomen.  Soft, nontender, nondistended, BS positive. CNS.  Alert and oriented .   No focal neurologic deficit. Extremities.  No edema, no cyanosis, pulses intact and symmetrical. Psychiatry.  Judgment and insight appears normal.   Data Reviewed: Prior data reviewed  Family Communication: Called daughter with no response.  Voicemail left due to generic message.  Disposition: Status is: Inpatient Remains inpatient appropriate because: Severity of illness  Planned Discharge Destination: Home  DVT prophylaxis.  Eliquis Time spent: 45 minutes  This record has been created using Systems analyst. Errors have been sought and corrected,but may not always be located. Such creation errors do not reflect on the standard of care.   Author: Lorella Nimrod, MD 12/11/2022 2:24 PM  For on call review [email or web address].

## 2022-12-11 NOTE — Assessment & Plan Note (Signed)
2D echo on 10/31/2021 showed EF of 45 to 50%. Patient has trace leg edema, elevated BNP 407, at risk of developing CHF exacerbation. Patient received 2.5 L normal saline bolus in ED. patient received  Bumex in ED and and started on IV Lasix. CT chest with concern of bilateral pleural effusion and little worsening of BNP. -Start her on IV Lasix 40 mg daily -Monitor volume status closely

## 2022-12-11 NOTE — Progress Notes (Signed)
Oxygen Saturations on RA at rest 94% Oxygen Saturations on RA ambulating 91-92%

## 2022-12-11 NOTE — Progress Notes (Signed)
*  PRELIMINARY RESULTS* Echocardiogram 2D Echocardiogram has been performed.  Alison Richardson 12/11/2022, 9:56 AM

## 2022-12-11 NOTE — Evaluation (Signed)
Physical Therapy Evaluation Patient Details Name: Alison Richardson MRN: [id number] DOB: Oct 29, 1933 Today's Date: 12/11/2022  History of Present Illness  Alison Richardson is an 84yoF who comes to [facility] on 12/17 from ALF c SOB. Pt with COVID infection about 4 weeks prior. Pt went for chest/angio CT c report detailing "No definite evidence of pulmonary embolus.   Mild to moderate size bilateral pleural effusions are noted with adjacent atelectasis of both lower lobes. Mild bilateral upper lobe airspace opacities are noted concerning for possible pneumonia, right greater than left."HTN, HLD, DM2, GERD, hypoTSH, depression, GAD, OSA not on CPAP, PVD, Alzheimer disease, systolic CHF, AF on Eliquis.  Clinical Impression  Pt admitted with above Dx. Pt has functional limitations due to deficits below (see "PT Problem List"). Pt able to provide details on baseline functional status. Today pt able to perform bed mobility, transfers, and short distance walking with reasonable effort, a RW to assist with balance largely due to uncontrolled coughing spells while standing. Pt is on room air and satting 93% even when SOB post cough. Patient's performance this date reveals decreased ability, independence, and tolerance in performing all basic mobility required for performance of activities of daily living. Pt requires additional DME, close physical assistance, and cues for safe participate in mobility. Pt will benefit from skilled PT intervention to increase independence and safety with basic mobility in preparation for discharge to the venue listed below.     No data found.      Recommendations for follow up therapy are one component of a multi-disciplinary discharge planning process, led by the attending physician.  Recommendations may be updated based on patient status, additional functional criteria and insurance authorization.  Follow Up Recommendations Home health PT      Assistance Recommended at Discharge Set  up Supervision/Assistance  Patient can return home with the following  Help with stairs or ramp for entrance;Assist for transportation;Assistance with cooking/housework    Equipment Recommendations None recommended by PT  Recommendations for Other Services       Functional Status Assessment Patient has had a recent decline in their functional status and demonstrates the ability to make significant improvements in function in a reasonable and predictable amount of time.     Precautions / Restrictions Precautions Precautions: Fall Restrictions Weight Bearing Restrictions: No      Mobility  Bed Mobility Overal bed mobility: Modified Independent                  Transfers Overall transfer level: Needs assistance Equipment used: None Transfers: Sit to/from Stand Sit to Stand: Min guard           General transfer comment: a bit unsteady due to uncontrolled coughing once up at bedside    Ambulation/Gait Ambulation/Gait assistance:  (deferred due to pericare needs and breakfast tray waiting)                Stairs            Wheelchair Mobility    Modified Rankin (Stroke Patients Only)       Balance Overall balance assessment: Modified Independent, History of Falls, Mild deficits observed, not formally tested (able to doff wet underpants while holding onto RW)                                           Pertinent Vitals/Pain Pain Assessment Pain Assessment: No/denies  pain    Home Living Family/patient expects to be discharged to:: Private residence Living Arrangements: Alone Available Help at Discharge: Available PRN/intermittently;Neighbor Type of Home: Independent living facility Home Access: [place]: One level Home Equipment: [place] (4 wheels);Shower seat;Grab bars - tub/shower;Grab bars - toilet;Cane - single point Additional Comments: [place] apartment on the 5th floor with  elevator access    Prior Function Prior Level of Function : Independent/Modified Independent             Mobility Comments: Reports no device needed most of time, but does use 4WW when AMB longer distances ADLs Comments: independent prior, reports Rt shoulder fracture ~6 weeks ago, now improving but painful still     Hand Dominance   Dominant Hand: Right    Extremity/Trunk Assessment   Upper Extremity Assessment Upper Extremity Assessment: Overall WFL for tasks assessed    Lower Extremity Assessment Lower Extremity Assessment: Overall WFL for tasks assessed    Cervical / Trunk Assessment Cervical / Trunk Assessment: Normal  Communication      Cognition Arousal/Alertness: Awake/alert Behavior During Therapy: WFL for tasks assessed/performed Overall Cognitive Status: Within Functional Limits for tasks assessed                                          General Comments      Exercises     Assessment/Plan    PT Assessment Patient needs continued PT services  PT Problem List Cardiopulmonary status limiting activity;Decreased mobility;Decreased activity tolerance       PT Treatment Interventions DME instruction;Balance training;Gait training;Stair training;Functional mobility training;Therapeutic activities;Patient/family education    PT Goals (Current goals can be found in the Care Plan section)  Acute Rehab PT Goals Patient Stated Goal: be rid of cough and O2, return to tolerated AMB activity PT Goal Formulation: With patient Time For Goal Achievement: 12/25/22 Potential to Achieve Goals: Good    Frequency Min 2X/week     Co-evaluation               AM-PAC PT "6 Clicks" Mobility  Outcome Measure Help needed turning from your back to your side while in a flat bed without using bedrails?: None Help needed moving from lying on your back to sitting on the side of a flat bed without using bedrails?: None Help needed moving to and from a  bed to a chair (including a wheelchair)?: A Little Help needed standing up from a chair using your arms (e.g., wheelchair or bedside chair)?: A Little Help needed to walk in hospital room?: A Little Help needed climbing 3-5 steps with a railing? : A Little 6 Click Score: 20    End of Session   Activity Tolerance: Patient tolerated treatment well;No increased pain Patient left: in chair;with nursing/sitter in room;with call bell/phone within reach Nurse Communication: Mobility status PT Visit Diagnosis: Difficulty in walking, not elsewhere classified (R26.2);Other abnormalities of gait and mobility (R26.89)    Time: 3419-6222 PT Time Calculation (min) (ACUTE ONLY): 18 min   Charges:   PT Evaluation $PT Eval Low Complexity: 1 Low        10:13 AM, 12/11/22 Etta Grandchild, PT, DPT Physical Therapist - [facility]  [phone] ([place])    Jordell Outten C 12/11/2022, 10:11 AM

## 2022-12-11 NOTE — Plan of Care (Signed)
  Problem: Education: Goal: Ability to describe self-care measures that may prevent or decrease complications (Diabetes Survival Skills Education) will improve Outcome: Progressing Goal: Individualized Educational Video(s) Outcome: Progressing   Problem: Coping: Goal: Ability to adjust to condition or change in health will improve Outcome: Progressing   Problem: Fluid Volume: Goal: Ability to maintain a balanced intake and output will improve Outcome: Progressing   Problem: Health Behavior/Discharge Planning: Goal: Ability to identify and utilize available resources and services will improve Outcome: Progressing Goal: Ability to manage health-related needs will improve Outcome: Progressing   Problem: Metabolic: Goal: Ability to maintain appropriate glucose levels will improve Outcome: Progressing   Problem: Nutritional: Goal: Maintenance of adequate nutrition will improve Outcome: Progressing Goal: Progress toward achieving an optimal weight will improve Outcome: Progressing   Problem: Skin Integrity: Goal: Risk for impaired skin integrity will decrease Outcome: Progressing   Problem: Tissue Perfusion: Goal: Adequacy of tissue perfusion will improve Outcome: Progressing   Problem: Education: Goal: Ability to demonstrate management of disease process will improve Outcome: Progressing Goal: Ability to verbalize understanding of medication therapies will improve Outcome: Progressing Goal: Individualized Educational Video(s) Outcome: Progressing   Problem: Activity: Goal: Capacity to carry out activities will improve Outcome: Progressing   Problem: Cardiac: Goal: Ability to achieve and maintain adequate cardiopulmonary perfusion will improve Outcome: Progressing   Problem: Activity: Goal: Ability to tolerate increased activity will improve Outcome: Progressing   Problem: Clinical Measurements: Goal: Ability to maintain a body temperature in the normal range will  improve Outcome: Progressing   Problem: Respiratory: Goal: Ability to maintain adequate ventilation will improve Outcome: Progressing Goal: Ability to maintain a clear airway will improve Outcome: Progressing   Problem: Education: Goal: Knowledge of General Education information will improve Description: Including pain rating scale, medication(s)/side effects and non-pharmacologic comfort measures Outcome: Progressing   Problem: Health Behavior/Discharge Planning: Goal: Ability to manage health-related needs will improve Outcome: Progressing   Problem: Clinical Measurements: Goal: Ability to maintain clinical measurements within normal limits will improve Outcome: Progressing Goal: Will remain free from infection Outcome: Progressing Goal: Diagnostic test results will improve Outcome: Progressing Goal: Respiratory complications will improve Outcome: Progressing Goal: Cardiovascular complication will be avoided Outcome: Progressing   Problem: Activity: Goal: Risk for activity intolerance will decrease Outcome: Progressing   Problem: Nutrition: Goal: Adequate nutrition will be maintained Outcome: Progressing   Problem: Coping: Goal: Level of anxiety will decrease Outcome: Progressing   Problem: Elimination: Goal: Will not experience complications related to bowel motility Outcome: Progressing Goal: Will not experience complications related to urinary retention Outcome: Progressing   Problem: Pain Managment: Goal: General experience of comfort will improve Outcome: Progressing   Problem: Safety: Goal: Ability to remain free from injury will improve Outcome: Progressing   Problem: Skin Integrity: Goal: Risk for impaired skin integrity will decrease Outcome: Progressing

## 2022-12-11 NOTE — Assessment & Plan Note (Signed)
Acute hypoxic respiratory failure.  Improved, able to wean off to room air. Imaging with concern of bilateral infiltrate but procalcitonin remain negative. Persistent hypoxia and elevated lactic acid most likely secondary to decreased oxygenation.  Strep pneumo negative.  No obvious bacterial infection.  COVID-19, influenza and RSV negative.  Differential at this time as PE or post COVID pneumonitis.  Although patient was on anticoagulation with Eliquis CTA was negative for PE but did show bilateral pleural effusion and small bilateral infiltrate involving upper lobes.  Ceftriaxone was discontinued yesterday -Pulmonary consult-recommending repeat echo. -Continue with Zithromax for its anti-inflammatory effect -Check Fungitell -Continue Solu-Medrol for now -Continue with supportive care

## 2022-12-11 NOTE — Evaluation (Signed)
Occupational Therapy Evaluation Patient Details Name: Alison Richardson MRN: [id number] DOB: 02/17/1933 Today's Date: 12/11/2022   History of Present Illness Alison Richardson is an 75yoF who comes to [facility] on 12/17 from ALF c SOB. Pt with COVID infection about 4 weeks prior. Pt went for chest/angio CT c report detailing "No definite evidence of pulmonary embolus.   Mild to moderate size bilateral pleural effusions are noted with adjacent atelectasis of both lower lobes. Mild bilateral upper lobe airspace opacities are noted concerning for possible pneumonia, right greater than left."HTN, HLD, DM2, GERD, hypoTSH, depression, GAD, OSA not on CPAP, PVD, Alzheimer disease, systolic CHF, AF on Eliquis.   Clinical Impression   [name] presents with generalized weakness, limited endurance, fatigue, and cough. She [place] in the [place] section of a [place], is largely IND in BADL and IADL but has experienced deceased fxl mobility in recent months, with a R shoulder fx ~ 2 months ago and Covid ~ 1 month ago. She reports 1 fall (resulting in shoulder fx) in 2023. Earlier this year she ambulated w/o AD, but has recently begun using a walking stick or rollator. During today's evaluation, pt denies pain, is able to perform bed mobility, transfers, dressing in standing, grooming, ambulating around nurses' station with RW, all with Mod I-SUPV for safety, using RW, no LOB, and keeping O2 sats at 96% or greater. She moves well and demonstrates good sitting and standing balance; however, she reports feeling much more fatigued than usual and with limited endurance. Provided educ on use of RW and encouraged pt to use regularly. Recommend DC home, with HHOT to assist pt in regaining strength and endurance for return to PLOF.     Recommendations for follow up therapy are one component of a multi-disciplinary discharge planning process, led by the attending physician.  Recommendations may be updated based on patient  status, additional functional criteria and insurance authorization.   Follow Up Recommendations  Home health OT     Assistance Recommended at Discharge PRN  Patient can return home with the following A little help with bathing/dressing/bathroom;Assist for transportation;Assistance with cooking/housework    Functional Status Assessment  Patient has had a recent decline in their functional status and demonstrates the ability to make significant improvements in function in a reasonable and predictable amount of time.  Equipment Recommendations  None recommended by OT    Recommendations for Other Services       Precautions / Restrictions Precautions Precautions: Fall Restrictions Weight Bearing Restrictions: No      Mobility Bed Mobility Overal bed mobility: Modified Independent                  Transfers Overall transfer level: Needs assistance Equipment used: Rolling walker (2 wheels) Transfers: Sit to/from Stand Sit to Stand: Supervision                  Balance Overall balance assessment: Needs assistance Sitting-balance support: Single extremity supported, Feet supported Sitting balance-Leahy Scale: Good     Standing balance support: During functional activity, Bilateral upper extremity supported, Single extremity supported Standing balance-Leahy Scale: Good Standing balance comment: steady reaching beyond BOS w/ no LOB                           ADL either performed or assessed with clinical judgement   ADL Overall ADL's : Needs assistance/impaired Eating/Feeding: Modified independent   Grooming: Standing;Wash/dry hands;Wash/dry face;Brushing hair Grooming Details (indicate cue  type and reason): fatigues after several minutes in standing         Upper Body Dressing : Standing;Supervision/safety Upper Body Dressing Details (indicate cue type and reason): uses sink cabinet for steadying support                          Vision         Perception     Praxis      Pertinent Vitals/Pain Pain Assessment Pain Assessment: No/denies pain     Hand Dominance Right   Extremity/Trunk Assessment Upper Extremity Assessment Upper Extremity Assessment: Overall WFL for tasks assessed   Lower Extremity Assessment Lower Extremity Assessment: Overall WFL for tasks assessed   Cervical / Trunk Assessment Cervical / Trunk Assessment: Normal   Communication Communication Communication: No difficulties   Cognition Arousal/Alertness: Awake/alert Behavior During Therapy: WFL for tasks assessed/performed Overall Cognitive Status: Within Functional Limits for tasks assessed                                       General Comments       Exercises Other Exercises Other Exercises: Educ re: rehab process, falls prevention, DME use   Shoulder Instructions      Home Living Family/patient expects to be discharged to:: Private residence Living Arrangements: Alone Available Help at Discharge: Available PRN/intermittently;Neighbor Type of Home: Independent living facility Home Access: [place]: One level     Bathroom Shower/Tub: Occupational psychologist: Handicapped height     [place]: Grab bars - tub/shower;Grab bars - toilet;Cane - single point;Air cabin crew (4 wheels)   Additional Comments: [place] apartment on the 5th floor with elevator access      Prior Functioning/Environment Prior Level of Function : Independent/Modified Independent             Mobility Comments: Uses rollator or cane with ambulation ADLs Comments: independent prior, reports Rt shoulder fracture ~8 weeks ago, now improving but painful still. Does not drive, friend takes her shopping. She receives 1 meal/day in [employer], prepares other foods herself.        OT Problem List: Decreased strength;Decreased activity tolerance;Cardiopulmonary status  limiting activity;Decreased knowledge of use of DME or AE      OT Treatment/Interventions:      OT Goals(Current goals can be found in the care plan section) Acute Rehab OT Goals Patient Stated Goal: to get back to walking regular at her facility OT Goal Formulation: With patient Time For Goal Achievement: 12/25/22 Potential to Achieve Goals: Good  OT Frequency:      Co-evaluation              AM-PAC OT "6 Clicks" Daily Activity     Outcome Measure Help from another person eating meals?: None Help from another person taking care of personal grooming?: A Little Help from another person toileting, which includes using toliet, bedpan, or urinal?: A Little Help from another person bathing (including washing, rinsing, drying)?: A Little Help from another person to put on and taking off regular upper body clothing?: None Help from another person to put on and taking off regular lower body clothing?: A Little 6 Click Score: 20   End of Session Equipment Utilized During Treatment: Rolling walker (2 wheels) Nurse Communication: Mobility status  Activity Tolerance: Patient tolerated treatment well  Patient left: in chair;with family/visitor present;with nursing/sitter in room;with call bell/phone within reach  OT Visit Diagnosis: Muscle weakness (generalized) (M62.81);History of falling (Z91.81)                Time: 7800-4471 OT Time Calculation (min): 32 min Charges:  OT General Charges $OT Visit: 1 Visit OT Evaluation $OT Eval Low Complexity: 1 Low OT Treatments $Self Care/Home Management : 23-37 mins Josiah Lobo, PhD, MS, OTR/L 12/11/22, 2:14 PM

## 2022-12-11 NOTE — Progress Notes (Signed)
PT Followup Note  Patient Details Name: Alison Richardson MRN: [id number] DOB: 11/25/1933   Cancelled Treatment:    Reason Eval/Treat Not Completed: Other (comment) (Came back to check in with patient regarding opportunities to AMB. Pt reports AMB with staff earlier today, into hallway, reports close to baseline pacing and breathing tolerance. Pt still on room air. RN made aware.)  4:15 PM, 12/11/22 Etta Grandchild, PT, DPT Physical Therapist - [facility]  [phone] ([place])    Anthonette Lesage C 12/11/2022, 4:15 PM

## 2022-12-11 NOTE — Progress Notes (Signed)
MD requested to have 2L via  [place] placed on patient for increasing lactic acid. 2L placed by bedside RN Cherlyn Labella

## 2022-12-12 DIAGNOSIS — E876 Hypokalemia: Secondary | ICD-10-CM

## 2022-12-12 DIAGNOSIS — J45909 Unspecified asthma, uncomplicated: Secondary | ICD-10-CM

## 2022-12-12 DIAGNOSIS — J45901 Unspecified asthma with (acute) exacerbation: Secondary | ICD-10-CM

## 2022-12-12 DIAGNOSIS — I509 Heart failure, unspecified: Secondary | ICD-10-CM

## 2022-12-12 DIAGNOSIS — J189 Pneumonia, unspecified organism: Secondary | ICD-10-CM | POA: Diagnosis not present

## 2022-12-12 DIAGNOSIS — R0603 Acute respiratory distress: Principal | ICD-10-CM

## 2022-12-12 HISTORY — DX: Unspecified asthma, uncomplicated: J45.909

## 2022-12-12 HISTORY — DX: Hypokalemia: E87.6

## 2022-12-12 LAB — BASIC METABOLIC PANEL
Anion gap: 7 (ref 5–15)
BUN: 30 mg/dL — ABNORMAL HIGH (ref 8–23)
CO2: 24 mmol/L (ref 22–32)
Calcium: 8.6 mg/dL — ABNORMAL LOW (ref 8.9–10.3)
Chloride: 108 mmol/L (ref 98–111)
Creatinine, Ser: 0.84 mg/dL (ref 0.44–1.00)
GFR, Estimated: >60 mL/min (ref >60)
Glucose, Bld: 268 mg/dL — ABNORMAL HIGH (ref 70–99)
Potassium: 4.5 mmol/L (ref 3.5–5.1)
Sodium: 139 mmol/L (ref 135–145)

## 2022-12-12 LAB — CBC
HCT: 35.4 % — ABNORMAL LOW (ref 36.0–46.0)
Hemoglobin: 11.1 g/dL — ABNORMAL LOW (ref 12.0–15.0)
MCH: 27.1 pg (ref 26.0–34.0)
MCHC: 31.4 g/dL (ref 30.0–36.0)
MCV: 86.3 fL (ref 80.0–100.0)
Platelets: 317 10*3/uL (ref 150–400)
RBC: 4.1 MIL/uL (ref 3.87–5.11)
RDW: 15.5 % (ref 11.5–15.5)
WBC: 16.3 10*3/uL — ABNORMAL HIGH (ref 4.0–10.5)
nRBC: 0 % (ref 0.0–0.2)

## 2022-12-12 LAB — GLUCOSE, CAPILLARY
Glucose-Capillary: 230 mg/dL — ABNORMAL HIGH (ref 70–99)
Glucose-Capillary: 217 mg/dL — ABNORMAL HIGH (ref 70–99)

## 2022-12-12 MED ORDER — DAPAGLIFLOZIN PROPANEDIOL 10 MG PO TABS: 10.0000 mg | ORAL_TABLET | Freq: Every day | ORAL | 2 refills | Status: DC

## 2022-12-12 MED ORDER — INSULIN GLARGINE-YFGN 100 UNIT/ML ~~LOC~~ SOLN
10.0000 [IU] | Freq: Two times a day (BID) | SUBCUTANEOUS | Status: DC
  Filled 2022-12-12: qty 0.1

## 2022-12-12 MED ORDER — INSULIN ASPART 100 UNIT/ML IJ SOLN
6.0000 [IU] | Freq: Three times a day (TID) | INTRAMUSCULAR | Status: DC
  Administered 2022-12-12: 6 [IU] via SUBCUTANEOUS
  Filled 2022-12-12: qty 1

## 2022-12-12 MED ORDER — SPIRONOLACTONE 25 MG PO TABS: 12.5000 mg | ORAL_TABLET | Freq: Every day | ORAL | 0 refills | Status: DC

## 2022-12-12 MED ORDER — TORSEMIDE 20 MG PO TABS: 20.0000 mg | ORAL_TABLET | Freq: Every day | ORAL | Status: DC

## 2022-12-12 MED ORDER — SPIRONOLACTONE 12.5 MG HALF TABLET: 12.5000 mg | ORAL_TABLET | Freq: Every day | ORAL | Status: DC

## 2022-12-12 MED ORDER — GLIPIZIDE ER 5 MG PO TB24: 5.0000 mg | ORAL_TABLET | Freq: Every day | ORAL | 2 refills | Status: DC

## 2022-12-12 NOTE — TOC Initial Note (Addendum)
Transition of Care [facility]) - Initial/Assessment Note    Patient Details  Name: Alison Richardson MRN: [id number] Date of Birth: 09-16-1933  Transition of Care [facility]) CM/SW Contact:    Colen Darling, [place] Phone Number: 12/12/2022, 1:42 PM  Clinical Narrative:                    TOC met with the patient at bedside.The patient sees PCP Jon Billings. She drives to her appointments. [facility] [place], [place]. She would like physical therapy provided at her [place].  The patient will need PT orders faxed to [employer] [phone]. [employer] can be contacted at [phone].    Orders have been faxed to [facility].  Orders have been refaxed to [phone].  Daughter is providing a ride home.  Patient Goals and CMS Choice     Choice offered to / list presented to : Patient      Expected Discharge Plan and Services         Living arrangements for the past 2 months: [place] ([place]) Expected Discharge Date: 12/12/22                         [facility] Arranged: PT          Prior Living Arrangements/Services Living arrangements for the past 2 months: [place] ([place]) Lives with:: Self Patient language and need for interpreter reviewed:: No Do you feel safe going back to the place where you live?: Yes               Activities of Daily Living Home Assistive Devices/Equipment: Environmental consultant (specify type) ADL Screening (condition at time of admission) Patient's cognitive ability adequate to safely complete daily activities?: Yes Is the patient deaf or have difficulty hearing?: No Does the patient have difficulty seeing, even when wearing glasses/contacts?: No Does the patient have difficulty concentrating, remembering, or making decisions?: No Patient able to express need for assistance  with ADLs?: Yes Does the patient have difficulty dressing or bathing?: No Independently performs ADLs?: Yes (appropriate for developmental age) Does the patient have difficulty walking or climbing stairs?: No Weakness of Legs: Both Weakness of Arms/Hands: None  Permission Sought/Granted                  Emotional Assessment Appearance:: Appears stated age            Admission diagnosis:  Hypokalemia [E87.6] Respiratory distress [R06.03] Hypoxia [R09.02] Multifocal pneumonia [J18.9] Moderate asthma with acute exacerbation, unspecified whether persistent [J45.901] Community acquired pneumonia, unspecified laterality [J18.9] Acute on chronic congestive heart failure, unspecified heart failure type ([place]) [I50.9] Sepsis, due to unspecified organism, unspecified whether acute organ dysfunction present [facility]) [A41.9] Patient Active Problem List   Diagnosis Date Noted   Respiratory distress 12/12/2022   Hypokalemia 12/12/2022   Moderate asthma with acute exacerbation 12/12/2022   Multifocal pneumonia 12/09/2022   Depression with anxiety 12/09/2022   Alzheimer disease ([place]) 12/09/2022   CAD (coronary artery disease) 12/09/2022   COVID-19 11/06/2022   Chronic cough 10/15/2022   Humerus fracture 05/16/2022   Atrial fibrillation, chronic ([place]) 05/16/2022   Humeral surgical neck fracture 05/16/2022   Intractable pain 05/15/2022   Use of cane as ambulatory aid 04/03/2022   History of falling 04/03/2022   History of non-ST elevation myocardial infarction (NSTEMI) 04/03/2022   History of  cerebral aneurysm 04/03/2022   Gastroesophageal reflux disease 02/12/2022   Severe sepsis ([place]) 02/07/2022   DDD (degenerative disc disease), lumbar    HLD (hyperlipidemia) 10/31/2021   Chronic diastolic CHF (congestive heart failure) ([place]) 10/13/2020   Acute on chronic congestive heart failure ([place]) 10/07/2020   DOE (dyspnea on exertion) 05/09/2020   Cor pulmonale, chronic ([place]) 09/27/2019    Late onset Alzheimer's disease without behavioral disturbance ([place]) 09/23/2019   Obstructive sleep apnea 07/09/2019   Type 2 diabetes mellitus with peripheral neuropathy ([place]) 01/21/2019   PSVT (paroxysmal supraventricular tachycardia) 01/21/2019   PAD (peripheral artery disease) ([place]) 01/06/2019   DM type 2 with diabetic mixed hyperlipidemia ([place]) 09/08/2018   Anxiety disorder 06/19/2018   Depression, major, single episode, mild ([place]) 06/19/2018   Vitamin B12 deficiency 06/19/2018   Insomnia 06/19/2018   Protein-calorie malnutrition ([place]) 06/19/2018   Hypertension associated with diabetes ([place]) 06/19/2018   Hypothyroidism 06/19/2018   Overactive bladder 06/19/2018   Primary osteoarthritis involving multiple joints 06/18/2018   Hypertriglyceridemia 11/01/2015   PCP:  Jon Billings, NP Pharmacy:   [place], [place] - [address] [place] [address] Phone: [phone] Fax: [phone]     Social Determinants of Health (SDOH) Social History: SDOH Screenings   Food Insecurity: No Food Insecurity (12/10/2022)  Housing: Low Risk  (12/10/2022)  Transportation Needs: No Transportation Needs (12/10/2022)  Utilities: Not At Risk (12/10/2022)  Alcohol Screen: Low Risk  (03/20/2022)  Depression (PHQ2-9): Low Risk  (08/21/2022)  Financial Resource Strain: Low Risk  (03/20/2022)  Physical Activity: Insufficiently Active (03/20/2022)  Social Connections: Moderately Integrated (03/20/2022)  Stress: No Stress Concern Present (03/20/2022)  Tobacco Use: Low Risk  (12/10/2022)   SDOH Interventions:     Readmission Risk Interventions    01/26/2022    2:47 PM 11/02/2021   12:38 PM  Readmission Risk Prevention Plan  Transportation Screening Complete Complete  PCP or Specialist Appt within 3-5 Days Complete Complete  HRI or Home Care Consult Complete Complete  Social Work Consult for [place] Planning/Counseling Complete Complete   Palliative Care Screening Not Applicable Not Applicable  Medication Review Press photographer) Complete Complete

## 2022-12-12 NOTE — Progress Notes (Signed)
Physical Therapy Treatment Patient Details Name: Alison Richardson MRN: [id number] DOB: [birth year] Today's Date: 12/12/2022   History of Present Illness Alison Richardson is an 43yoF who [facility] from ALF c SOB. Pt with COVID infection about 4 weeks prior. Pt went for chest/angio CT c report detailing "No definite evidence of pulmonary embolus.   Mild to moderate size bilateral pleural effusions are noted with adjacent atelectasis of both lower lobes. Mild bilateral upper lobe airspace opacities are noted concerning for possible pneumonia, right greater than left."HTN, HLD, DM2, GERD, hypoTSH, depression, GAD, OSA not on CPAP, PVD, Alzheimer disease, systolic CHF, AF on Eliquis.    PT Comments    Pt is progressing well with functional mobility performing bed mobility modI, transfers, and gait at supervision level (close to ModI).  Pt performed all functional mobility on RA, SPO2 95-98%.  Pt would benefit from further balance training.  Current d/c plan is appropriate.  Recommendations for follow up therapy are one component of a multi-disciplinary discharge planning process, led by the attending physician.  Recommendations may be updated based on patient status, additional functional criteria and insurance authorization.  Follow Up Recommendations  Home health PT     Assistance Recommended at Discharge Set up Supervision/Assistance  Patient can return home with the following Help with stairs or ramp for entrance;Assist for transportation;Assistance with cooking/housework   Equipment Recommendations  None recommended by PT    Recommendations for Other Services       Precautions / Restrictions Precautions Precautions: Fall Restrictions Weight Bearing Restrictions: No     Mobility  Bed Mobility Overal bed mobility: Modified Independent                  Transfers Overall transfer level: Needs assistance Equipment used: None Transfers: Sit to/from Stand, Bed to  chair/wheelchair/BSC Sit to Stand: Supervision   Step pivot transfers: Supervision       General transfer comment: Pt more steady today with transfers.    Ambulation/Gait Ambulation/Gait assistance: Supervision Gait Distance (Feet): 120 Feet (x2) Assistive device: Rolling walker (2 wheels), None (2nd lap without walker, supervision, pt self corected unsteadiness) Gait Pattern/deviations: Step-through pattern Gait velocity: decreased     General Gait Details: more steady with RW with greater ability to visually scan but is progressing with steadiness without walker.   Stairs             Wheelchair Mobility    Modified Rankin (Stroke Patients Only)       Balance Overall balance assessment: Modified Independent Sitting-balance support: Feet unsupported Sitting balance-Leahy Scale: Normal     Standing balance support: During functional activity, Single extremity supported, No upper extremity supported Standing balance-Leahy Scale: Good Standing balance comment: steady reaching beyond BOS w/ no LOB                            Cognition Arousal/Alertness: Awake/alert Behavior During Therapy: WFL for tasks assessed/performed Overall Cognitive Status: Within Functional Limits for tasks assessed                                          Exercises Total Joint Exercises Ankle Circles/Pumps: AROM, 10 reps, Seated, Both Long Arc Quad: AROM, 10 reps, Both, Seated    General Comments        Pertinent Vitals/Pain Pain Assessment Pain Assessment:  No/denies pain    Home Living                          Prior Function            PT Goals (current goals can now be found in the care plan section) Acute Rehab PT Goals Patient Stated Goal: be rid of cough and O2, return to tolerated AMB activity PT Goal Formulation: With patient Time For Goal Achievement: 12/25/22 Potential to Achieve Goals: Good Progress towards PT goals:  Progressing toward goals    Frequency    Min 2X/week      PT Plan Current plan remains appropriate    Co-evaluation              AM-PAC PT "6 Clicks" Mobility   Outcome Measure  Help needed turning from your back to your side while in a flat bed without using bedrails?: None Help needed moving from lying on your back to sitting on the side of a flat bed without using bedrails?: None Help needed moving to and from a bed to a chair (including a wheelchair)?: None Help needed standing up from a chair using your arms (e.g., wheelchair or bedside chair)?: None Help needed to walk in hospital room?: A Little Help needed climbing 3-5 steps with a railing? : A Little 6 Click Score: 22    End of Session Equipment Utilized During Treatment: Gait belt Activity Tolerance: Patient tolerated treatment well;No increased pain Patient left: in chair;with call bell/phone within reach;with chair alarm set   PT Visit Diagnosis: Difficulty in walking, not elsewhere classified (R26.2);Other abnormalities of gait and mobility (R26.89)     Time: 1017-5102 PT Time Calculation (min) (ACUTE ONLY): 23 min  Charges:  $Gait Training: 8-22 mins $Therapeutic Activity: 8-22 mins                     Bjorn Loser, PTA  12/12/22, 11:28 AM

## 2022-12-12 NOTE — Discharge Summary (Signed)
Physician Discharge Summary   Patient: Alison Richardson MRN: [id number] DOB: 21-Nov-1933  Admit date:     12/09/2022  Discharge date: 12/12/22  Discharge Physician: Lorella Nimrod   PCP: Jon Billings, NP   Recommendations at discharge:  Please obtain CBC and BMP in 1 week Follow-up with cardiology Follow-up with pulmonology Follow-up with PCP  Discharge Diagnoses: Principal Problem:   Multifocal pneumonia Active Problems:   Severe sepsis ([place])   Chronic diastolic CHF (congestive heart failure) ([place])   Hypothyroidism   Type 2 diabetes mellitus with peripheral neuropathy (HCC)   Atrial fibrillation, chronic (HCC)   HLD (hyperlipidemia)   CAD (coronary artery disease)   Alzheimer disease ([place])   Depression with anxiety   Acute on chronic congestive heart failure ([place])   Respiratory distress   Hypokalemia   Moderate asthma with acute exacerbation   Hospital Course: Taken from H&P.  Alison Richardson is a 88 y.o. female with medical history significant of hypertension, hyperlipidemia, diabetes mellitus, GERD, hypothyroidism, depression, anxiety, OSA not on CPAP, PVD, mild pulmonary hypertension, Alzheimer disease, systolic CHF, atrial fibrillation on Eliquis, who presents with shortness breath.   Patient states that she had COVID infection about 4 weeks ago.  In the past several days, she developed shortness breath, cough, wheezing which has been progressively worsening.  She coughs up clear mucus, no fever or chills.  She has a chest discomfort.  No nausea vomiting, diarrhea or abdominal pain with no symptoms of UTI.Patient states that she is taking Eliquis consistently, last dose was last night.   Data reviewed independently and ED Course: pt was found to have troponin level 8 --> 9, WBC 16.1, lactic acid 2.9, BNP 407, negative COVID PCR and flu, potassium 3.1, renal function okay, temperature normal, blood pressure 168/10 3, heart rate of 109, RR 20, oxygen saturation 89% on room  air, which improved to 94% on 2 L oxygen (patient does not use oxygen at home).  Chest x-ray with multifocal infiltration and bronchitis change.  EKG:  Atrial fibrillation, QTc 514, LAD, low voltage, poor R wave progression.   Patient received 2.5 L of fluid in the ED, as initial consideration was sepsis.  Due to little worsening of respiratory status she was started on IV diuresis as her BNP was elevated with history of HFrEF.  She was started on ceftriaxone and Zithromax, along with steroid and IV Lasix.  12/18: Vitals stable, saturating 95% on 3 L of oxygen, UOP of 2400 recorded.  Preliminary blood cultures negative.  Procalcitonin negative.  Strep pneumo urinary antigen negative.  Repeat lactic acid with some worsening to 5.2>>4.2    repeat procalcitonin remain negative. Holding further IV Lasix.  Pulmonology was also consulted. Due to persistent hypoxia-we will obtain CTA to rule out PE. Most likely post-COVID pneumonitis.  12/19: Vitals stable.  Able to wean off from oxygen.  CTA with no PE, did show mild to moderate bilateral pleural effusions with adjacent atelectasis of both lower lobes.  Also noted mild bilateral upper lobe airspace opacities concerning for possible pneumonia, right greater than left.  A moderate size sliding type hiatal hernia.  Echocardiogram pending Labs with little worsening of BNP at 521, ESR normal at 10, mild hyperkalemia with potassium at 5.4, improving leukocytosis at 17.3, stable hemoglobin around 10.8.  Blood glucose 235-increasing Semglee to 8 units twice daily and adding 4 units with meal. Adding daily Lasix 40 mg IV.  We will continue with IV diuresis for another day. Persistent lactic acidosis-can be  due to albuterol or DuoNeb.  Discussed with pulmonology and they are advising to stop steroid and breathing treatments.  12/20: Hemodynamically stable.  Echocardiogram with low normal EF and no diastolic dysfunction.  Essentially normal.  UOP of 2500 with net  negative of -6450 recorded. Patient remained stable on room air, saturation remained in mid 90s at rest and with ambulation.  Does not need oxygen at this time.  Cardiology is recommending continuation of home dose of torsemide and also added low-dose spironolactone and they will follow-up as an outpatient for further recommendations. We also increase the dose of glipizide and added Farxiga to help with her elevated blood glucose levels.  She need to follow-up with her PCP very closely for further management of her diabetes.  She will continue on current medications and need to have a close follow-up with her providers for further recommendations.  Assessment and Plan: * Multifocal pneumonia Acute hypoxic respiratory failure.  Improved, able to wean off to room air. Imaging with concern of bilateral infiltrate but procalcitonin remain negative. Persistent hypoxia and elevated lactic acid most likely secondary to decreased oxygenation.  Strep pneumo negative.  No obvious bacterial infection.  COVID-19, influenza and RSV negative.  Differential at this time as PE or post COVID pneumonitis.  Although patient was on anticoagulation with Eliquis CTA was negative for PE but did show bilateral pleural effusion and small bilateral infiltrate involving upper lobes.  Ceftriaxone was discontinued yesterday -Pulmonary consult-recommending repeat echo. -Continue with Zithromax for its anti-inflammatory effect -Check Fungitell -Continue Solu-Medrol for now -Continue with supportive care   Severe sepsis [facility]) Patient met SIRS criteria with leukocytosis, little worsening which can be due to steroid use.  Lactic acidosis most likely secondary to decreased oxygenation and hypoxia.  No obvious source yet so sepsis currently not ruled in. Sepsis ruled out. Procalcitonin and blood cultures negative so far. -Discontinue ceftriaxone -Continue to monitor  Chronic diastolic CHF (congestive heart failure) ([place]) 2D  echo on 10/31/2021 showed EF of 45 to 50%. Patient has trace leg edema, elevated BNP 407, at risk of developing CHF exacerbation. Patient received 2.5 L normal saline bolus in ED. patient received  Bumex in ED and and started on IV Lasix. CT chest with concern of bilateral pleural effusion and little worsening of BNP. -Start her on IV Lasix 40 mg daily -Monitor volume status closely  Hypothyroidism -Continue home Synthroid  Type 2 diabetes mellitus with peripheral neuropathy (HCC) A1c 7.6 recently, poorly controlled.  Patient is taking glipizide at home CBG elevated, patient is also on steroid -Add Semglee 5 units twice daily -Switch to moderate SSI  Atrial fibrillation, chronic (HCC) Continue Eliquis -Metoprolol, Cardizem  HLD (hyperlipidemia) -Continue pravastatin  CAD (coronary artery disease) No current chest pain. -Continue home Plavix, pravastatin and Imdur -As needed nitroglycerin  Alzheimer disease ([place]) -Continue home donepezil  Depression with anxiety -Continue home medications   Consultants: Cardiology, pulmonology Procedures performed: None Disposition: Home Diet recommendation:  Discharge Diet Orders (From admission, onward)     Start     Ordered   12/12/22 0000  Diet - low sodium heart healthy        12/12/22 1226           Cardiac and Carb modified diet DISCHARGE MEDICATION: Allergies as of 12/12/2022       Reactions   Pantoprazole Nausea And Vomiting   Metformin And Related Diarrhea        Medication List     STOP taking these  medications    oxyCODONE 5 MG immediate release tablet Commonly known as: Oxy IR/ROXICODONE       TAKE these medications    albuterol 108 (90 Base) MCG/ACT inhaler Commonly known as: VENTOLIN HFA INHALE 2 PUFFS INTO THE LUNGS EVERY 6 HOURS AS NEEDED FOR WHEEZING OR SHORTNESS OF BREATH   amiodarone 200 MG tablet Commonly known as: PACERONE Take 200 mg by mouth daily.   budesonide-formoterol 160-4.5  MCG/ACT inhaler Commonly known as: Symbicort Inhale 2 puffs into the lungs 2 (two) times daily.   clopidogrel 75 MG tablet Commonly known as: PLAVIX Take 75 mg by mouth daily.   dapagliflozin propanediol 10 MG Tabs tablet Commonly known as: Farxiga Take 1 tablet (10 mg total) by mouth daily before breakfast.   desloratadine 5 MG tablet Commonly known as: Clarinex Take 1 tablet (5 mg total) by mouth daily.   diltiazem 240 MG 24 hr capsule Commonly known as: CARDIZEM CD Take 240 mg by mouth daily.   donepezil 10 MG tablet Commonly known as: ARICEPT TAKE ONE TABLET (10 MG) BY MOUTH AT BEDTIME   Eliquis 5 MG Tabs tablet Generic drug: apixaban Take 5 mg by mouth 2 (two) times daily.   famotidine 20 MG tablet Commonly known as: PEPCID TAKE 1 TABLET BY MOUTH ONCE DAILY   fluticasone 50 MCG/ACT nasal spray Commonly known as: FLONASE Place 1 spray into both nostrils daily.   gabapentin 100 MG capsule Commonly known as: NEURONTIN Take 100 mg by mouth daily.   glipiZIDE 5 MG 24 hr tablet Commonly known as: GLUCOTROL XL Take 1 tablet (5 mg total) by mouth daily. What changed:  medication strength how much to take   isosorbide mononitrate 30 MG 24 hr tablet Commonly known as: IMDUR Take 30 mg by mouth daily.   levothyroxine 88 MCG tablet Commonly known as: SYNTHROID Take 1 tablet (88 mcg total) by mouth daily.   metoprolol succinate 100 MG 24 hr tablet Commonly known as: TOPROL-XL TAKE ONE TABLET (100 MG) BY MOUTH EVERY DAY (TAKE WITH OR IMMEDIATELY AFTER A MEAL)   nitroGLYCERIN 0.4 MG SL tablet Commonly known as: NITROSTAT Place 0.4 mg under the tongue every 5 (five) minutes as needed for chest pain.   omeprazole 20 MG capsule Commonly known as: PRILOSEC Take 1 capsule (20 mg total) by mouth 2 (two) times daily before a meal.   pravastatin 20 MG tablet Commonly known as: PRAVACHOL Take 20 mg by mouth at bedtime.   spironolactone 25 MG tablet Commonly known  as: ALDACTONE Take 0.5 tablets (12.5 mg total) by mouth daily. Start taking on: December 13, 2022   torsemide 20 MG tablet Commonly known as: DEMADEX Take 1 tablet (20 mg total) by mouth daily.   venlafaxine XR 75 MG 24 hr capsule Commonly known as: EFFEXOR-XR TAKE 1 CAPSULE BY MOUTH ONCE DAILY.        Follow-up Information     Paraschos, Alexander, MD. Go in 1 week(s).   Specialty: Cardiology Contact information: [facility] [place] [address] [phone]         Jon Billings, NP. Schedule an appointment as soon as possible for a visit in 1 week(s).   Specialty: Nurse Practitioner Contact information: [place] [address] [phone]                Discharge Exam: Filed Weights   12/09/22 0358 12/11/22 0338 12/12/22 0606  Weight: 77.1 kg 97.8 kg 79.7 kg  General.     In no acute distress. Pulmonary.  Lungs clear bilaterally, normal respiratory effort. CV.  Regular rate and rhythm, no JVD, rub or murmur. Abdomen.  Soft, nontender, nondistended, BS positive. CNS.  Alert and oriented .  No focal neurologic deficit. Extremities.  No edema, no cyanosis, pulses intact and symmetrical. Psychiatry.  Judgment and insight appears normal.   Condition at discharge: stable  The results of significant diagnostics from this hospitalization (including imaging, microbiology, ancillary and laboratory) are listed below for reference.   Imaging Studies: ECHOCARDIOGRAM COMPLETE  Result Date: 12/11/2022    ECHOCARDIOGRAM REPORT   Patient Name:   KIMIYE STRATHMAN Date of Exam: 12/11/2022 Medical Rec #:  [id number]      Height:       66.0 in Accession #:    [id number]     Weight:       215.6 lb Date of Birth:  1933-06-08       BSA:          2.065 m Patient Age:    61 years       BP:           138/82 mmHg Patient Gender: F              HR:           68 bpm. Exam Location:  [facility] Procedure: 2D Echo, Color Doppler and  Cardiac Doppler Indications:     I25.5 Cardiomyopathy-Ischemic  History:         Patient has prior history of Echocardiogram examinations, most                  recent 10/31/2021. PVD; Risk Factors:Hypertension and Diabetes.  Sonographer:     Charmayne Sheer Referring Phys:  [id number] CARMEN Veda Canning Diagnosing Phys: Isaias Cowman MD  Sonographer Comments: Suboptimal subcostal window. IMPRESSIONS  1. Left ventricular ejection fraction, by estimation, is 50 to 55%. The left ventricle has low normal function. The left ventricle has no regional wall motion abnormalities. Left ventricular diastolic parameters were normal.  2. Right ventricular systolic function is normal. The right ventricular size is normal.  3. The mitral valve is normal in structure. Mild mitral valve regurgitation. No evidence of mitral stenosis.  4. The aortic valve is normal in structure. Aortic valve regurgitation is not visualized. No aortic stenosis is present.  5. The inferior vena cava is normal in size with greater than 50% respiratory variability, suggesting right atrial pressure of 3 mmHg. FINDINGS  Left Ventricle: Left ventricular ejection fraction, by estimation, is 50 to 55%. The left ventricle has low normal function. The left ventricle has no regional wall motion abnormalities. The left ventricular internal cavity size was normal in size. There is no left ventricular hypertrophy. Left ventricular diastolic parameters were normal. Right Ventricle: The right ventricular size is normal. No increase in right ventricular wall thickness. Right ventricular systolic function is normal. Left Atrium: Left atrial size was normal in size. Right Atrium: Right atrial size was normal in size. Pericardium: There is no evidence of pericardial effusion. Mitral Valve: The mitral valve is normal in structure. Mild mitral valve regurgitation. No evidence of mitral valve stenosis. Tricuspid Valve: The tricuspid valve is normal in structure. Tricuspid valve  regurgitation is mild . No evidence of tricuspid stenosis. Aortic Valve: The aortic valve is normal in structure. Aortic valve regurgitation is not visualized. No aortic stenosis is present. Aortic valve mean gradient measures 2.0 mmHg. Aortic valve peak gradient  measures 4.2 mmHg. Aortic valve area, by VTI measures 1.91 cm. Pulmonic Valve: The pulmonic valve was normal in structure. Pulmonic valve regurgitation is not visualized. No evidence of pulmonic stenosis. Aorta: The aortic root is normal in size and structure. Venous: The inferior vena cava is normal in size with greater than 50% respiratory variability, suggesting right atrial pressure of 3 mmHg. IAS/Shunts: No atrial level shunt detected by color flow Doppler.  LEFT VENTRICLE PLAX 2D LVIDd:         4.40 cm   Diastology LVIDs:         3.80 cm   LV e' medial:    12.20 cm/s LV PW:         1.00 cm   LV E/e' medial:  8.9 LV IVS:        1.00 cm   LV e' lateral:   10.60 cm/s LVOT diam:     1.90 cm   LV E/e' lateral: 10.3 LV SV:         42 LV SV Index:   20 LVOT Area:     2.84 cm  RIGHT VENTRICLE RV Basal diam:  3.60 cm RV S prime:     15.60 cm/s LEFT ATRIUM             Index        RIGHT ATRIUM           Index LA diam:        3.90 cm 1.89 cm/m   RA Area:     17.60 cm LA Vol (A2C):   56.7 ml 27.46 ml/m  RA Volume:   50.80 ml  24.60 ml/m LA Vol (A4C):   62.0 ml 30.03 ml/m LA Biplane Vol: 63.6 ml 30.80 ml/m  AORTIC VALVE                    PULMONIC VALVE AV Area (Vmax):    1.93 cm     PV Vmax:       0.73 m/s AV Area (Vmean):   1.94 cm     PV Vmean:      53.400 cm/s AV Area (VTI):     1.91 cm     PV VTI:        0.154 m AV Vmax:           103.00 cm/s  PV Peak grad:  2.1 mmHg AV Vmean:          72.600 cm/s  PV Mean grad:  1.0 mmHg AV VTI:            0.220 m AV Peak Grad:      4.2 mmHg AV Mean Grad:      2.0 mmHg LVOT Vmax:         70.10 cm/s LVOT Vmean:        49.700 cm/s LVOT VTI:          0.148 m LVOT/AV VTI ratio: 0.67  AORTA Ao Root diam: 3.30 cm MITRAL  VALVE                TRICUSPID VALVE MV Area (PHT): 6.27 cm     TR Peak grad:   44.1 mmHg MV Decel Time: 121 msec     TR Vmax:        332.00 cm/s MV E velocity: 109.00 cm/s MV A velocity: 39.90 cm/s   SHUNTS MV E/A ratio:  2.73         Systemic VTI:  0.15 m                             Systemic Diam: 1.90 cm Isaias Cowman MD Electronically signed by Isaias Cowman MD Signature Date/Time: 12/11/2022/1:41:33 PM    Final    CT Angio Chest Pulmonary Embolism (PE) W or WO Contrast  Result Date: 12/10/2022 CLINICAL DATA:  Difficulty breathing. EXAM: CT ANGIOGRAPHY CHEST WITH CONTRAST TECHNIQUE: Multidetector CT imaging of the chest was performed using the standard protocol during bolus administration of intravenous contrast. Multiplanar CT image reconstructions and MIPs were obtained to evaluate the vascular anatomy. RADIATION DOSE REDUCTION: This exam was performed according to the departmental dose-optimization program which includes automated exposure control, adjustment of the mA and/or kV according to patient size and/or use of iterative reconstruction technique. CONTRAST:  56m OMNIPAQUE IOHEXOL 350 MG/ML SOLN COMPARISON:  None Available. FINDINGS: Cardiovascular: Satisfactory opacification of the pulmonary arteries to the segmental level. No evidence of pulmonary embolism. Mild cardiomegaly. No pericardial effusion. Mediastinum/Nodes: Moderate size sliding-type hiatal hernia. Thyroid gland is unremarkable. No adenopathy is noted. Lungs/Pleura: Mild to moderate bilateral pleural effusions are noted with adjacent subsegmental atelectasis of both lower lobes. No pneumothorax is noted. Bilateral upper lobe airspace opacities are noted concerning for possible pneumonia, right greater than left. Upper Abdomen: No acute abnormality. Musculoskeletal: No chest wall abnormality. No acute or significant osseous findings. Review of the MIP images confirms the above findings. IMPRESSION: No definite evidence of  pulmonary embolus. Mild to moderate size bilateral pleural effusions are noted with adjacent atelectasis of both lower lobes. Mild bilateral upper lobe airspace opacities are noted concerning for possible pneumonia, right greater than left. Moderate size sliding-type hiatal hernia. Aortic Atherosclerosis (ICD10-I70.0). Electronically Signed   By: JMarijo ConceptionM.D.   On: 12/10/2022 17:29   DG Chest Port 1 View  Result Date: 12/09/2022 CLINICAL DATA:  88year old female with history of shortness of breath. Recent diagnosis of COVID. EXAM: PORTABLE CHEST 1 VIEW COMPARISON:  Chest x-ray 11/09/2022. FINDINGS: Lung volumes are normal. Widespread interstitial prominence, patchy peribronchial cuffing and ill-defined opacities scattered throughout the lungs bilaterally. No definite pleural effusions. No pneumothorax. Pulmonary vasculature is largely obscured. Heart size is borderline enlarged. Upper mediastinal contours are within normal limits. Atherosclerotic calcifications are noted in the thoracic aorta. IMPRESSION: 1. The appearance the chest is most compatible with severe bronchitis and developing multilobar bilateral bronchopneumonia, as above. 2. Aortic atherosclerosis. Electronically Signed   By: DVinnie LangtonM.D.   On: 12/09/2022 05:13    Microbiology: Results for orders placed or performed during the hospital encounter of 12/09/22  Resp panel by RT-PCR (RSV, Flu A&B, Covid) Anterior Nasal Swab     Status: None   Collection Time: 12/09/22  4:05 AM   Specimen: Anterior Nasal Swab  Result Value Ref Range Status   SARS Coronavirus 2 by RT PCR NEGATIVE NEGATIVE Final    Comment: (NOTE) SARS-CoV-2 target nucleic acids are NOT DETECTED.  The SARS-CoV-2 RNA is generally detectable in upper respiratory specimens during the acute phase of infection. The lowest concentration of SARS-CoV-2 viral copies this assay can detect is 138 copies/mL. A negative result does not preclude SARS-Cov-2 infection  and should not be used as the sole basis for treatment or other patient management decisions. A negative result may occur with  improper specimen collection/handling, submission of specimen other than nasopharyngeal swab, presence of viral mutation(s) within the areas targeted by this assay,  and inadequate number of viral copies(<138 copies/mL). A negative result must be combined with clinical observations, patient history, and epidemiological information. The expected result is Negative.  Fact Sheet for Patients:  EntrepreneurPulse.com.au  Fact Sheet for Healthcare Providers:  IncredibleEmployment.be  This test is no t yet approved or cleared by the [place] FDA and  has been authorized for detection and/or diagnosis of SARS-CoV-2 by FDA under an Emergency Use Authorization (EUA). This EUA will remain  in effect (meaning this test can be used) for the duration of the COVID-19 declaration under Section 564(b)(1) of the Act, 21 U.S.C.section 360bbb-3(b)(1), unless the authorization is terminated  or revoked sooner.       Influenza A by PCR NEGATIVE NEGATIVE Final   Influenza B by PCR NEGATIVE NEGATIVE Final    Comment: (NOTE) The Xpert Xpress SARS-CoV-2/FLU/RSV plus assay is intended as an aid in the diagnosis of influenza from Nasopharyngeal swab specimens and should not be used as a sole basis for treatment. Nasal washings and aspirates are unacceptable for Xpert Xpress SARS-CoV-2/FLU/RSV testing.  Fact Sheet for Patients: EntrepreneurPulse.com.au  Fact Sheet for Healthcare Providers: IncredibleEmployment.be  This test is not yet approved or cleared by the [place] FDA and has been authorized for detection and/or diagnosis of SARS-CoV-2 by FDA under an Emergency Use Authorization (EUA). This EUA will remain in effect (meaning this test can be used) for the duration of the COVID-19 declaration  under Section 564(b)(1) of the Act, 21 U.S.C. section 360bbb-3(b)(1), unless the authorization is terminated or revoked.     Resp Syncytial Virus by PCR NEGATIVE NEGATIVE Final    Comment: (NOTE) Fact Sheet for Patients: EntrepreneurPulse.com.au  Fact Sheet for Healthcare Providers: IncredibleEmployment.be  This test is not yet approved or cleared by the [place] FDA and has been authorized for detection and/or diagnosis of SARS-CoV-2 by FDA under an Emergency Use Authorization (EUA). This EUA will remain in effect (meaning this test can be used) for the duration of the COVID-19 declaration under Section 564(b)(1) of the Act, 21 U.S.C. section 360bbb-3(b)(1), unless the authorization is terminated or revoked.  Performed at [facility], [address] [place], [place] [address]   Culture, blood (routine x 2)     Status: None (Preliminary result)   Collection Time: 12/09/22  5:30 AM   Specimen: BLOOD LEFT WRIST  Result Value Ref Range Status   Specimen Description BLOOD LEFT WRIST  Final   Special Requests   Final    BOTTLES DRAWN AEROBIC AND ANAEROBIC Blood Culture adequate volume   Culture   Final    NO GROWTH 3 DAYS Performed at [facility], [address] [place], [place] [address]    Report Status PENDING  Incomplete  Culture, blood (routine x 2)     Status: None (Preliminary result)   Collection Time: 12/09/22  4:42 PM   Specimen: BLOOD RIGHT HAND  Result Value Ref Range Status   Specimen Description BLOOD RIGHT HAND  Final   Special Requests   Final    BOTTLES DRAWN AEROBIC AND ANAEROBIC Blood Culture adequate volume   Culture   Final    NO GROWTH 3 DAYS Performed at [facility], [address], [place], [place] [address]    Report Status PENDING  Incomplete    Labs: CBC: Recent Labs  Lab 12/09/22 0405 12/10/22 0931 12/11/22 0443 12/12/22 0433  WBC 16.1* 21.3* 17.3* 16.3*   NEUTROABS 12.8*  --   --   --  HGB 12.9 11.0* 10.8* 11.1*  HCT 41.1 35.0* 34.3* 35.4*  MCV 86.5 86.8 87.7 86.3  PLT 306 321 313 324   Basic Metabolic Panel: Recent Labs  Lab 12/09/22 0405 12/10/22 0523 12/11/22 0443 12/12/22 0433  NA 141 137 140 139  K 3.1* 4.7 5.4* 4.5  CL 109 106 108 108  CO2 _0 GLUCOSE 210* 271* 235* 268*  BUN _1 30*  CREATININE 0.65 0.82 0.89 0.84  CALCIUM 9.0 9.0 8.9 8.6*  MG 2.2 2.0  --   --    Liver Function Tests: Recent Labs  Lab 12/09/22 0405  AST 63*  ALT 55*  ALKPHOS 113  BILITOT 1.1  PROT 7.8  ALBUMIN 4.3   CBG: Recent Labs  Lab 12/11/22 1206 12/11/22 1539 12/11/22 2040 12/12/22 0736 12/12/22 1119  GLUCAP 253* 155* 217* 230* 217*    Discharge time spent: greater than 30 minutes.  This record has been created using Systems analyst. Errors have been sought and corrected,but may not always be located. Such creation errors do not reflect on the standard of care.   Signed: Lorella Nimrod, MD [facility] 12/12/2022

## 2022-12-12 NOTE — TOC Transition Note (Signed)
Transition of Care [facility]) - CM/SW Discharge Note   Patient Details  Name: Alison Richardson MRN: [id number] Date of Birth: [birth year]  Transition of Care [facility]) CM/SW Contact:  Colen Darling, [place] Phone Number: 12/12/2022, 2:50 PM   Clinical Narrative:     Patient will receive physical therapy at her Independent Living. Referral faxed to Newman Nip at [phone].  Final next level of care: [place]     Patient Goals and CMS Choice   CMS Medicare.gov Compare Post Acute Care list provided to:: Patient Choice offered to / list presented to : Patient    Discharge Placement             PT at [place]          Discharge Plan and Services                          HH Arranged: PT       Representative spoke with at [place]: PT at [place] ([place]) Interventions     Readmission Risk Interventions    01/26/2022    2:47 PM 11/02/2021   12:38 PM  Readmission Risk Prevention Plan  Transportation Screening Complete Complete  PCP or Specialist Appt within 3-5 Days Complete Complete  HRI or Home Care Consult Complete Complete  Social Work Consult for [place] Planning/Counseling Complete Complete  Palliative Care Screening Not Applicable Not Applicable  Medication Review Press photographer) Complete Complete

## 2022-12-12 NOTE — Care Management Important Message (Signed)
Important Message  Patient Details  Name: Alison Richardson MRN: [id number] Date of Birth: 1933/02/18   Medicare Important Message Given:  Yes     [name] 12/12/2022, 1:43 PM

## 2022-12-12 NOTE — Consult Note (Signed)
[facility] NOTE       Patient ID: Gwendolynn Richardson MRN: [id number] DOB/AGE: 08-14-1933 88 y.o.  Admit date: 12/09/2022 Referring Physician Dr. Lorella Nimrod Primary Physician Jon Billings, NP  Primary Cardiologist Dr. Saralyn Pilar Reason for Consultation AoCHF  HPI: Alison Richardson is an 88yoF with a PMH of HFpEF (50-55%), paroxysmal AF, CAD hx NSTEMI 09/2021 (subtotal ostial RCA treated medically), HTN, HLD, DM2, hypothyroidism, who presented to [facility] ED 12/09/2022 with shortness of breath in the setting of recent COVID infection in 09/2022.  Her shortness of breath was initially felt to be secondary to multifocal pneumonia.  Cardiology is consulted for assistance with her heart failure.  Patient states she had a COVID infection a couple months ago and has had some residual cough and shortness of breath since then.  She was given IV fluids in the emergency department out of initial concern for sepsis empiric antibiotics for treatment of pneumonia and had a CTA chest to rule out pulmonary embolus with her persistent hypoxia over the first couple days of her hospitalization.  She was seen by pulmonology who felt her shortness of breath could be related to her diastolic heart failure.  The patient does tell me that she tends to hold off taking her home torsemide 20 mg once a day until she is finished running errands in the morning and think she may have missed several doses prior to her admission.  She felt her abdomen was rather tight prior to admission as well.  She tries her best to watch her salt but she has limited control over her meals as they are provided by in my time of evaluation she is just finished ambulating with physical therapy on room air and "feels much better."  She was diuresed with IV Lasix with excellent urine output.  She denies any chest pain, heart racing or palpitations.  There is no telemetry available for my review but on exam her heart rate and rhythm  sounds regular.    Review of systems complete and found to be negative unless listed above     Past Medical History:  Diagnosis Date   Arrhythmia    Atrial flutter ([place]) 01/2018   new onset    Basal cell carcinoma of back    Basal cell carcinoma of lip    Cerebral aneurysm    followed by [facility]   CHF (congestive heart failure) (HCC)    DDD (degenerative disc disease), lumbar    superior plate depression, L3 08/18/2014   Diabetes mellitus type II, controlled ([place])    Diverticulosis    Dysrhythmia    Paroxysmal Supraventricular Tachycardia   Dysthymia    depression   GERD (gastroesophageal reflux disease)    History of meniscal tear    Hyperlipidemia    Hypertension    Hypothyroidism    Late onset Alzheimer's disease with behavioral disturbance ([place])    Mild pulmonary hypertension ([place])    Overactive bladder    Peripheral vascular disease ([place])    Seasonal allergic rhinitis    Sleep apnea     Past Surgical History:  Procedure Laterality Date   APPENDECTOMY  1946   BASAL CELL CARCINOMA EXCISION     2006 and 2009 removed from back and lip   CARDIOVASCULAR STRESS TEST  2015   nuclear cardiac stress test negative for ischemia - Dr. Satira Sark   CATARACT EXTRACTION, BILATERAL  2006   COLONOSCOPY  2014   COLONOSCOPY N/A 08/19/2020   Procedure: COLONOSCOPY;  Surgeon: Lesly Rubenstein, MD;  Location: [facility] ENDOSCOPY;  Service: Endoscopy;  Laterality: N/A;   ECTOPIC PREGNANCY SURGERY  1957   ESOPHAGOGASTRODUODENOSCOPY N/A 08/19/2020   Procedure: ESOPHAGOGASTRODUODENOSCOPY (EGD);  Surgeon: Lesly Rubenstein, MD;  Location: [facility] ENDOSCOPY;  Service: Endoscopy;  Laterality: N/A;   INJECTION KNEE Left 05/2015   LEFT HEART CATH AND CORONARY ANGIOGRAPHY N/A 10/12/2021   Procedure: LEFT HEART CATH AND CORONARY ANGIOGRAPHY;  Surgeon: Corey Skains, MD;  Location: [place] CV LAB;  Service: Cardiovascular;  Laterality: N/A;   REPLACEMENT TOTAL KNEE Left 09/06/2016   Dr. Barnet Pall    TONSILLECTOMY AND ADENOIDECTOMY  1953   TOTAL ABDOMINAL HYSTERECTOMY  1986   [facility] B    Medications Prior to Admission  Medication Sig Dispense Refill Last Dose   amiodarone (PACERONE) 200 MG tablet Take 200 mg by mouth daily.    12/08/2022   budesonide-formoterol (SYMBICORT) 160-4.5 MCG/ACT inhaler Inhale 2 puffs into the lungs 2 (two) times daily. 1 each 2 12/08/2022   clopidogrel (PLAVIX) 75 MG tablet Take 75 mg by mouth daily.   12/08/2022   desloratadine (CLARINEX) 5 MG tablet Take 1 tablet (5 mg total) by mouth daily. 30 tablet 2 12/08/2022   diltiazem (CARDIZEM CD) 240 MG 24 hr capsule Take 240 mg by mouth daily.   12/08/2022   donepezil (ARICEPT) 10 MG tablet TAKE ONE TABLET (10 MG) BY MOUTH AT BEDTIME 90 tablet 1 12/08/2022   ELIQUIS 5 MG TABS tablet Take 5 mg by mouth 2 (two) times daily.    12/08/2022   famotidine (PEPCID) 20 MG tablet TAKE 1 TABLET BY MOUTH ONCE DAILY 90 tablet 1 12/08/2022   fluticasone (FLONASE) 50 MCG/ACT nasal spray Place 1 spray into both nostrils daily. 18.2 mL 2 12/08/2022   glipiZIDE (GLUCOTROL XL) 2.5 MG 24 hr tablet TAKE ONE TABLET BY MOUTH ONCE DAILY 90 tablet 1 12/08/2022   isosorbide mononitrate (IMDUR) 30 MG 24 hr tablet Take 30 mg by mouth daily.   12/08/2022   levothyroxine (SYNTHROID) 88 MCG tablet Take 1 tablet (88 mcg total) by mouth daily. 90 tablet 1 12/08/2022   metoprolol succinate (TOPROL-XL) 100 MG 24 hr tablet TAKE ONE TABLET (100 MG) BY MOUTH EVERY DAY (TAKE WITH OR IMMEDIATELY AFTER A MEAL) 90 tablet 0 12/08/2022   omeprazole (PRILOSEC) 20 MG capsule Take 1 capsule (20 mg total) by mouth 2 (two) times daily before a meal. 180 capsule 1 12/08/2022   pravastatin (PRAVACHOL) 20 MG tablet Take 20 mg by mouth at bedtime.   12/08/2022   torsemide (DEMADEX) 20 MG tablet Take 1 tablet (20 mg total) by mouth daily. 30 tablet 0 12/08/2022   venlafaxine XR (EFFEXOR-XR) 75 MG 24 hr capsule TAKE 1 CAPSULE BY MOUTH ONCE DAILY. 90 capsule 1 12/08/2022    albuterol (VENTOLIN HFA) 108 (90 Base) MCG/ACT inhaler INHALE 2 PUFFS INTO THE LUNGS EVERY 6 HOURS AS NEEDED FOR WHEEZING OR SHORTNESS OF BREATH 8.5 g 2 prn   gabapentin (NEURONTIN) 100 MG capsule Take 100 mg by mouth daily.   prn   nitroGLYCERIN (NITROSTAT) 0.4 MG SL tablet Place 0.4 mg under the tongue every 5 (five) minutes as needed for chest pain.   prn   oxyCODONE (OXY IR/ROXICODONE) 5 MG immediate release tablet Take 1 tablet (5 mg total) by mouth every 4 (four) hours as needed for severe pain or breakthrough pain. (Patient not taking: Reported on 12/09/2022) 30 tablet 0 Not Taking   Social History  Socioeconomic History   Marital status: Widowed    Spouse name: Not on file   Number of children: 5   Years of education: Not on file   Highest education level: Not on file  Occupational History   Not on file  Tobacco Use   Smoking status: Never   Smokeless tobacco: Never  Vaping Use   Vaping Use: Never used  Substance and Sexual Activity   Alcohol use: Yes    Alcohol/week: 3.0 standard drinks of alcohol    Types: 3 Shots of liquor per week    Comment: 3 x week    Drug use: Never   Sexual activity: Not Currently  Other Topics Concern   Not on file  Social History Narrative   Not on file   Social Determinants of Health   Financial Resource Strain: Low Risk  (03/20/2022)   Overall Financial Resource Strain (CARDIA)    Difficulty of Paying Living Expenses: Not hard at all  Food Insecurity: No Food Insecurity (12/10/2022)   Hunger Vital Sign    Worried About Running Out of Food in the Last Year: Never true    Ran Out of Food in the Last Year: Never true  Transportation Needs: No Transportation Needs (12/10/2022)   PRAPARE - Hydrologist (Medical): No    Lack of Transportation (Non-Medical): No  Physical Activity: Insufficiently Active (03/20/2022)   Exercise Vital Sign    Days of Exercise per Week: 2 days    Minutes of Exercise per Session:  30 min  Stress: No Stress Concern Present (03/20/2022)   [place]    Feeling of Stress : Not at all  Social Connections: Moderately Integrated (03/20/2022)   Social Connection and Isolation Panel [NHANES]    Frequency of Communication with Friends and Family: More than three times a week    Frequency of Social Gatherings with Friends and Family: More than three times a week    Attends Religious Services: More than 4 times per year    Active Member of [employer] or Organizations: Yes    Attends Archivist Meetings: More than 4 times per year    Marital Status: Widowed  Intimate Partner Violence: Not At Risk (12/10/2022)   Humiliation, Afraid, Rape, and Kick questionnaire    Fear of Current or Ex-Partner: No    Emotionally Abused: No    Physically Abused: No    Sexually Abused: No    Family History  Problem Relation Age of Onset   Hypertension Mother    Heart disease Father    CAD Father    Heart disease Sister    Diabetes Sister    Diabetes Paternal Uncle    Sleep apnea Son       Intake/Output Summary (Last 24 hours) at 12/12/2022 0821 Last data filed at 12/12/2022 0520 Gross per 24 hour  Intake 600 ml  Output 2500 ml  Net -1900 ml    Vitals:   12/11/22 2031 12/12/22 0459 12/12/22 0606 12/12/22 0736  BP: (!) 140/87 125/67  (!) 143/81  Pulse: 65 63  67  Resp: '18 18  16  '$ Temp: 97.7 F (36.5 C) 98.1 F (36.7 C)  99.8 F (37.7 C)  TempSrc:  Oral    SpO2: 97% 96%  100%  Weight:   79.7 kg   Height:        PHYSICAL EXAM General: Pleasant elderly Caucasian female, well nourished, in no  acute distress.  Sitting upright in recliner. HEENT:  Normocephalic and atraumatic. Neck:  No JVD.  Lungs: Normal respiratory effort on room air.  Decreased breath sounds without appreciable crackles or wheezes.   Heart: HRRR . Normal S1 and S2 without gallops or murmurs.  Abdomen: Slightly-distended appearing.   Msk: Normal strength and tone for age. Extremities: Warm and well perfused. No clubbing, cyanosis.  No peripheral edema.  Neuro: Alert and oriented X 3. Psych:  Answers questions appropriately.   Labs: Basic Metabolic Panel: Recent Labs    12/10/22 0523 12/11/22 0443 12/12/22 0433  NA 137 140 139  K 4.7 5.4* 4.5  CL 106 108 108  CO2 '22 27 24  '$ GLUCOSE 271* 235* 268*  BUN 20 23 30*  CREATININE 0.82 0.89 0.84  CALCIUM 9.0 8.9 8.6*  MG 2.0  --   --    Liver Function Tests: No results for input(s): "AST", "ALT", "ALKPHOS", "BILITOT", "PROT", "ALBUMIN" in the last 72 hours. No results for input(s): "LIPASE", "AMYLASE" in the last 72 hours. CBC: Recent Labs    12/11/22 0443 12/12/22 0433  WBC 17.3* 16.3*  HGB 10.8* 11.1*  HCT 34.3* 35.4*  MCV 87.7 86.3  PLT 313 317   Cardiac Enzymes: No results for input(s): "CKTOTAL", "CKMB", "CKMBINDEX", "TROPONINIHS" in the last 72 hours. BNP: Recent Labs    12/11/22 0443  BNP 521.5*   D-Dimer: No results for input(s): "DDIMER" in the last 72 hours. Hemoglobin A1C: No results for input(s): "HGBA1C" in the last 72 hours. Fasting Lipid Panel: No results for input(s): "CHOL", "HDL", "LDLCALC", "TRIG", "CHOLHDL", "LDLDIRECT" in the last 72 hours. Thyroid Function Tests: No results for input(s): "TSH", "T4TOTAL", "T3FREE", "THYROIDAB" in the last 72 hours.  Invalid input(s): "FREET3" Anemia Panel: No results for input(s): "VITAMINB12", "FOLATE", "FERRITIN", "TIBC", "IRON", "RETICCTPCT" in the last 72 hours.   Radiology: ECHOCARDIOGRAM COMPLETE  Result Date: 12/11/2022    ECHOCARDIOGRAM REPORT   Patient Name:   KARAGAN LEHR Date of Exam: 12/11/2022 Medical Rec #:  [id number]      Height:       66.0 in Accession #:    [id number]     Weight:       215.6 lb Date of Birth:  [birth year]       BSA:          2.065 m Patient Age:    68 years       BP:           138/82 mmHg Patient Gender: F              HR:           68 bpm. Exam Location:   [facility] Procedure: 2D Echo, Color Doppler and Cardiac Doppler Indications:     I25.5 Cardiomyopathy-Ischemic  History:         Patient has prior history of Echocardiogram examinations, most                  recent 10/31/2021. PVD; Risk Factors:Hypertension and Diabetes.  Sonographer:     Charmayne Sheer Referring Phys:  [id number] CARMEN Veda Canning Diagnosing Phys: Isaias Cowman MD  Sonographer Comments: Suboptimal subcostal window. IMPRESSIONS  1. Left ventricular ejection fraction, by estimation, is 50 to 55%. The left ventricle has low normal function. The left ventricle has no regional wall motion abnormalities. Left ventricular diastolic parameters were normal.  2. Right ventricular systolic function is normal. The right ventricular size is normal.  3. The  mitral valve is normal in structure. Mild mitral valve regurgitation. No evidence of mitral stenosis.  4. The aortic valve is normal in structure. Aortic valve regurgitation is not visualized. No aortic stenosis is present.  5. The inferior vena cava is normal in size with greater than 50% respiratory variability, suggesting right atrial pressure of 3 mmHg. FINDINGS  Left Ventricle: Left ventricular ejection fraction, by estimation, is 50 to 55%. The left ventricle has low normal function. The left ventricle has no regional wall motion abnormalities. The left ventricular internal cavity size was normal in size. There is no left ventricular hypertrophy. Left ventricular diastolic parameters were normal. Right Ventricle: The right ventricular size is normal. No increase in right ventricular wall thickness. Right ventricular systolic function is normal. Left Atrium: Left atrial size was normal in size. Right Atrium: Right atrial size was normal in size. Pericardium: There is no evidence of pericardial effusion. Mitral Valve: The mitral valve is normal in structure. Mild mitral valve regurgitation. No evidence of mitral valve stenosis. Tricuspid Valve: The tricuspid valve  is normal in structure. Tricuspid valve regurgitation is mild . No evidence of tricuspid stenosis. Aortic Valve: The aortic valve is normal in structure. Aortic valve regurgitation is not visualized. No aortic stenosis is present. Aortic valve mean gradient measures 2.0 mmHg. Aortic valve peak gradient measures 4.2 mmHg. Aortic valve area, by VTI measures 1.91 cm. Pulmonic Valve: The pulmonic valve was normal in structure. Pulmonic valve regurgitation is not visualized. No evidence of pulmonic stenosis. Aorta: The aortic root is normal in size and structure. Venous: The inferior vena cava is normal in size with greater than 50% respiratory variability, suggesting right atrial pressure of 3 mmHg. IAS/Shunts: No atrial level shunt detected by color flow Doppler.  LEFT VENTRICLE PLAX 2D LVIDd:         4.40 cm   Diastology LVIDs:         3.80 cm   LV e' medial:    12.20 cm/s LV PW:         1.00 cm   LV E/e' medial:  8.9 LV IVS:        1.00 cm   LV e' lateral:   10.60 cm/s LVOT diam:     1.90 cm   LV E/e' lateral: 10.3 LV SV:         42 LV SV Index:   20 LVOT Area:     2.84 cm  RIGHT VENTRICLE RV Basal diam:  3.60 cm RV S prime:     15.60 cm/s LEFT ATRIUM             Index        RIGHT ATRIUM           Index LA diam:        3.90 cm 1.89 cm/m   RA Area:     17.60 cm LA Vol (A2C):   56.7 ml 27.46 ml/m  RA Volume:   50.80 ml  24.60 ml/m LA Vol (A4C):   62.0 ml 30.03 ml/m LA Biplane Vol: 63.6 ml 30.80 ml/m  AORTIC VALVE                    PULMONIC VALVE AV Area (Vmax):    1.93 cm     PV Vmax:       0.73 m/s AV Area (Vmean):   1.94 cm     PV Vmean:      53.400 cm/s AV Area (VTI):  1.91 cm     PV VTI:        0.154 m AV Vmax:           103.00 cm/s  PV Peak grad:  2.1 mmHg AV Vmean:          72.600 cm/s  PV Mean grad:  1.0 mmHg AV VTI:            0.220 m AV Peak Grad:      4.2 mmHg AV Mean Grad:      2.0 mmHg LVOT Vmax:         70.10 cm/s LVOT Vmean:        49.700 cm/s LVOT VTI:          0.148 m LVOT/AV VTI ratio:  0.67  AORTA Ao Root diam: 3.30 cm MITRAL VALVE                TRICUSPID VALVE MV Area (PHT): 6.27 cm     TR Peak grad:   44.1 mmHg MV Decel Time: 121 msec     TR Vmax:        332.00 cm/s MV E velocity: 109.00 cm/s MV A velocity: 39.90 cm/s   SHUNTS MV E/A ratio:  2.73         Systemic VTI:  0.15 m                             Systemic Diam: 1.90 cm Isaias Cowman MD Electronically signed by Isaias Cowman MD Signature Date/Time: 12/11/2022/1:41:33 PM    Final    CT Angio Chest Pulmonary Embolism (PE) W or WO Contrast  Result Date: 12/10/2022 CLINICAL DATA:  Difficulty breathing. EXAM: CT ANGIOGRAPHY CHEST WITH CONTRAST TECHNIQUE: Multidetector CT imaging of the chest was performed using the standard protocol during bolus administration of intravenous contrast. Multiplanar CT image reconstructions and MIPs were obtained to evaluate the vascular anatomy. RADIATION DOSE REDUCTION: This exam was performed according to the departmental dose-optimization program which includes automated exposure control, adjustment of the mA and/or kV according to patient size and/or use of iterative reconstruction technique. CONTRAST:  58m OMNIPAQUE IOHEXOL 350 MG/ML SOLN COMPARISON:  None Available. FINDINGS: Cardiovascular: Satisfactory opacification of the pulmonary arteries to the segmental level. No evidence of pulmonary embolism. Mild cardiomegaly. No pericardial effusion. Mediastinum/Nodes: Moderate size sliding-type hiatal hernia. Thyroid gland is unremarkable. No adenopathy is noted. Lungs/Pleura: Mild to moderate bilateral pleural effusions are noted with adjacent subsegmental atelectasis of both lower lobes. No pneumothorax is noted. Bilateral upper lobe airspace opacities are noted concerning for possible pneumonia, right greater than left. Upper Abdomen: No acute abnormality. Musculoskeletal: No chest wall abnormality. No acute or significant osseous findings. Review of the MIP images confirms the above  findings. IMPRESSION: No definite evidence of pulmonary embolus. Mild to moderate size bilateral pleural effusions are noted with adjacent atelectasis of both lower lobes. Mild bilateral upper lobe airspace opacities are noted concerning for possible pneumonia, right greater than left. Moderate size sliding-type hiatal hernia. Aortic Atherosclerosis (ICD10-I70.0). Electronically Signed   By: JMarijo ConceptionM.D.   On: 12/10/2022 17:29   DG Chest Port 1 View  Result Date: 12/09/2022 CLINICAL DATA:  88year old female with history of shortness of breath. Recent diagnosis of COVID. EXAM: PORTABLE CHEST 1 VIEW COMPARISON:  Chest x-ray 11/09/2022. FINDINGS: Lung volumes are normal. Widespread interstitial prominence, patchy peribronchial cuffing and ill-defined opacities scattered throughout the lungs bilaterally. No definite pleural  effusions. No pneumothorax. Pulmonary vasculature is largely obscured. Heart size is borderline enlarged. Upper mediastinal contours are within normal limits. Atherosclerotic calcifications are noted in the thoracic aorta. IMPRESSION: 1. The appearance the chest is most compatible with severe bronchitis and developing multilobar bilateral bronchopneumonia, as above. 2. Aortic atherosclerosis. Electronically Signed   By: Vinnie Langton M.D.   On: 12/09/2022 05:13    TELEMETRY reviewed by me (LT) 12/12/2022 : none avvailable  EKG reviewed by me: AF rate 108 LAFB, similar to prior from 04/2022  Data reviewed by me (LT) 12/12/2022: Last cardiology note, hospitalist progress note, pulmonary note, CBC BMP I's and O's vitals BNP  Principal Problem:   Multifocal pneumonia Active Problems:   Hypothyroidism   Type 2 diabetes mellitus with peripheral neuropathy ([place])   Chronic diastolic CHF (congestive heart failure) (HCC)   HLD (hyperlipidemia)   Severe sepsis (HCC)   Atrial fibrillation, chronic ([place])   Depression with anxiety   Alzheimer disease ([place])   CAD (coronary artery  disease)    ASSESSMENT AND PLAN:  Alison Richardson is an 62yoF with a PMH of HFpEF (50-55%  ), paroxysmal AF, CAD hx NSTEMI 09/2021 (subtotal ostial RCA treated medically), HTN, HLD, DM2, hypothyroidism, who presented to [facility] ED 12/09/2022 with shortness of breath in the setting of recent COVID infection in 10/2022.  Her shortness of breath was initially felt to be secondary to multifocal pneumonia.  Cardiology is consulted for assistance with her heart failure.  # Acute hypoxic respiratory failure # Recent COVID-19 infection 10/2022 # Acute on chronic HFpEF BNP trended from admission [phone].  She has diuresed well since admission with net -6.4 L documented since admission.  # Paroxysmal atrial fibrillation Historically difficult to control heart rates, she has been maintained on diltiazem CD 240 mg once daily, amiodarone 200 mg once daily, and metoprolol XL 100 mg once daily.  On Eliquis 5 mg twice daily for stroke prevention.  CHA2DS2-VASc 6.  # CAD Chest pain-free, negative troponins on admission 8, 9. Continue clopidogrel 75 mg daily   This patient's plan of care was discussed and created with Dr. Saralyn Pilar and he is in agreement.  Signed: Tristan Schroeder , PA-C 12/12/2022, 8:21 AM [facility] Cardiology

## 2022-12-13 ENCOUNTER — Telehealth: Payer: Self-pay | Admitting: *Deleted

## 2022-12-13 ENCOUNTER — Other Ambulatory Visit: Payer: Self-pay | Admitting: Nurse Practitioner

## 2022-12-13 NOTE — Patient Outreach (Signed)
  Care Coordination [facility] Note Transition Care Management Unsuccessful Follow-up Telephone Call  Date of discharge and from where:  [facility] [phone]  Attempts:  2nd Attempt  Reason for unsuccessful TCM follow-up call:  No answer/busy   [place] Management [phone]

## 2022-12-13 NOTE — Patient Outreach (Signed)
  Care Coordination [facility] Note Transition Care Management Unsuccessful Follow-up Telephone Call  Date of discharge and from where:  [facility] [phone]  Attempts:  1st Attempt  Reason for unsuccessful TCM follow-up call:  No answer/busy  [place] Management [phone]

## 2022-12-13 NOTE — Telephone Encounter (Signed)
Requested Prescriptions  Pending Prescriptions Disp Refills   omeprazole (PRILOSEC) 20 MG capsule [Pharmacy Med Name: OMEPRAZOLE DR 20 MG CAP] 180 capsule 1    Sig: TAKE 1 CAPSULE BY MOUTH TWICE DAILY BEFORE A MEAL     Gastroenterology: Proton Pump Inhibitors Passed - 12/13/2022  8:25 AM      Passed - Valid encounter within last 12 months    Recent Outpatient Visits           1 month ago [place], Karen, NP   1 month ago Annual physical exam   [facility] Jon Billings, NP   3 months ago Viral upper respiratory tract infection   [facility] Jon Billings, NP   5 months ago Hypertension associated with diabetes [facility])   [facility] Jon Billings, NP   8 months ago Chronic diastolic CHF (congestive heart failure) [facility])   [facility] Jon Billings, NP       Future Appointments             In 1 month Jon Billings, NP [facility], [place]

## 2022-12-14 ENCOUNTER — Telehealth: Payer: Self-pay | Admitting: *Deleted

## 2022-12-14 LAB — MISC LABCORP TEST (SEND OUT): Labcorp test code: 832599

## 2022-12-14 LAB — CULTURE, BLOOD (ROUTINE X 2)
Culture: NO GROWTH
Culture: NO GROWTH
Special Requests: ADEQUATE
Special Requests: ADEQUATE

## 2022-12-14 NOTE — Patient Outreach (Signed)
  Care Coordination [facility] Note Transition Care Management Unsuccessful Follow-up Telephone Call  Date of discharge and from where:  [facility] [phone]  Attempts:  3rd Attempt  Reason for unsuccessful TCM follow-up call:  No answer/busy   [facility] [phone]

## 2022-12-24 NOTE — Patient Instructions (Incomplete)

## 2022-12-26 DIAGNOSIS — A419 Sepsis, unspecified organism: Secondary | ICD-10-CM | POA: Diagnosis not present

## 2022-12-26 DIAGNOSIS — I739 Peripheral vascular disease, unspecified: Secondary | ICD-10-CM | POA: Diagnosis not present

## 2022-12-26 DIAGNOSIS — I4891 Unspecified atrial fibrillation: Secondary | ICD-10-CM | POA: Diagnosis not present

## 2022-12-26 DIAGNOSIS — J189 Pneumonia, unspecified organism: Secondary | ICD-10-CM | POA: Diagnosis not present

## 2022-12-26 DIAGNOSIS — M6281 Muscle weakness (generalized): Secondary | ICD-10-CM | POA: Diagnosis not present

## 2022-12-26 DIAGNOSIS — I2781 Cor pulmonale (chronic): Secondary | ICD-10-CM | POA: Diagnosis not present

## 2022-12-26 DIAGNOSIS — E1140 Type 2 diabetes mellitus with diabetic neuropathy, unspecified: Secondary | ICD-10-CM | POA: Diagnosis not present

## 2022-12-26 DIAGNOSIS — E1169 Type 2 diabetes mellitus with other specified complication: Secondary | ICD-10-CM | POA: Diagnosis not present

## 2022-12-26 DIAGNOSIS — G4733 Obstructive sleep apnea (adult) (pediatric): Secondary | ICD-10-CM | POA: Diagnosis not present

## 2022-12-26 DIAGNOSIS — R279 Unspecified lack of coordination: Secondary | ICD-10-CM | POA: Diagnosis not present

## 2022-12-26 DIAGNOSIS — I471 Supraventricular tachycardia, unspecified: Secondary | ICD-10-CM | POA: Diagnosis not present

## 2022-12-26 DIAGNOSIS — I5032 Chronic diastolic (congestive) heart failure: Secondary | ICD-10-CM | POA: Diagnosis not present

## 2022-12-26 DIAGNOSIS — R2689 Other abnormalities of gait and mobility: Secondary | ICD-10-CM | POA: Diagnosis not present

## 2022-12-26 DIAGNOSIS — I480 Paroxysmal atrial fibrillation: Secondary | ICD-10-CM | POA: Diagnosis not present

## 2022-12-26 DIAGNOSIS — G301 Alzheimer's disease with late onset: Secondary | ICD-10-CM | POA: Diagnosis not present

## 2022-12-26 DIAGNOSIS — I2510 Atherosclerotic heart disease of native coronary artery without angina pectoris: Secondary | ICD-10-CM | POA: Diagnosis not present

## 2022-12-27 ENCOUNTER — Encounter: Payer: Self-pay | Admitting: Family

## 2022-12-27 ENCOUNTER — Ambulatory Visit: Payer: Medicare PPO | Attending: Family | Admitting: Family

## 2022-12-27 VITALS — BP 124/69 | HR 80 | Resp 16 | Wt 169.0 lb

## 2022-12-27 DIAGNOSIS — I10 Essential (primary) hypertension: Secondary | ICD-10-CM

## 2022-12-27 DIAGNOSIS — I4891 Unspecified atrial fibrillation: Secondary | ICD-10-CM | POA: Diagnosis not present

## 2022-12-27 DIAGNOSIS — E1151 Type 2 diabetes mellitus with diabetic peripheral angiopathy without gangrene: Secondary | ICD-10-CM | POA: Insufficient documentation

## 2022-12-27 DIAGNOSIS — E782 Mixed hyperlipidemia: Secondary | ICD-10-CM | POA: Diagnosis not present

## 2022-12-27 DIAGNOSIS — I482 Chronic atrial fibrillation, unspecified: Secondary | ICD-10-CM

## 2022-12-27 DIAGNOSIS — Z7901 Long term (current) use of anticoagulants: Secondary | ICD-10-CM | POA: Diagnosis not present

## 2022-12-27 DIAGNOSIS — Z7984 Long term (current) use of oral hypoglycemic drugs: Secondary | ICD-10-CM | POA: Diagnosis not present

## 2022-12-27 DIAGNOSIS — I110 Hypertensive heart disease with heart failure: Secondary | ICD-10-CM | POA: Diagnosis not present

## 2022-12-27 DIAGNOSIS — Z79899 Other long term (current) drug therapy: Secondary | ICD-10-CM | POA: Insufficient documentation

## 2022-12-27 DIAGNOSIS — G301 Alzheimer's disease with late onset: Secondary | ICD-10-CM | POA: Insufficient documentation

## 2022-12-27 DIAGNOSIS — E1169 Type 2 diabetes mellitus with other specified complication: Secondary | ICD-10-CM

## 2022-12-27 DIAGNOSIS — I5032 Chronic diastolic (congestive) heart failure: Secondary | ICD-10-CM | POA: Insufficient documentation

## 2022-12-27 NOTE — Progress Notes (Signed)
Patient ID: Alison Richardson, female    DOB: [birth year], 89 y.o.   MRN: [id number]  HPI  Alison Richardson is a 89 y/o female with a history of skin cancer, DM, hyperlipidemia, HTN, thyroid disease, GERD, cerebral aneurysm, atrial flutter, mild pulmonary HTN, PVD, sleep apnea, late onset alzheimer's disease and chronic heart failure.   Echo 12/11/22 showed an EF of 50-55% along with mild MR. Echo report from 10/31/21 reviewed and showed an EF of 45-50% along with mild MR. Echo report from 10/07/20 reviewed and showed an EF of 60-65% along with mild MR but without regional wall motion abnormalities.   LHC done 10/12/21:   Ost RCA to Prox RCA lesion is 99% stenosed.   Dist RCA lesion is 65% stenosed.   RPDA lesion is 60% stenosed.   1st Diag lesion is 99% stenosed.   Ost Cx to Prox Cx lesion is 25% stenosed.   Ost LAD to Prox LAD lesion is 25% stenosed.   There is mild left ventricular systolic dysfunction.   LV end diastolic pressure is mildly elevated.   The left ventricular ejection fraction is 35-45% by visual estimate.  Admitted 12/09/22 due to pneumonia.   She presents today for a follow-up visit with a chief complaint of moderate fatigue with minimal exertion. Describes this as chronic in nature. Has associated cough, shortness of breath, difficulty sleeping and light-headedness along with this. Denies any abdominal distention, palpitations, pedal edema, chest pain or weight gain.   Brought her pill packs with her and there isn't farxiga in them. She says that she should be getting her new monthly pill packs soone.   Past Medical History:  Diagnosis Date   Arrhythmia    Atrial flutter ([place]) 01/2018   new onset    Basal cell carcinoma of back    Basal cell carcinoma of lip    Cerebral aneurysm    followed by [facility]   CHF (congestive heart failure) (HCC)    DDD (degenerative disc disease), lumbar    superior plate depression, L3 08/18/2014   Diabetes mellitus type II, controlled ([place])     Diverticulosis    Dysrhythmia    Paroxysmal Supraventricular Tachycardia   Dysthymia    depression   GERD (gastroesophageal reflux disease)    History of meniscal tear    Hyperlipidemia    Hypertension    Hypothyroidism    Late onset Alzheimer's disease with behavioral disturbance ([place])    Mild pulmonary hypertension ([place])    Overactive bladder    Peripheral vascular disease ([place])    Seasonal allergic rhinitis    Sleep apnea    Past Surgical History:  Procedure Laterality Date   APPENDECTOMY  1946   BASAL CELL CARCINOMA EXCISION     2006 and 2009 removed from back and lip   CARDIOVASCULAR STRESS TEST  2015   nuclear cardiac stress test negative for ischemia - Dr. Satira Sark   CATARACT EXTRACTION, BILATERAL  2006   COLONOSCOPY  2014   COLONOSCOPY N/A 08/19/2020   Procedure: COLONOSCOPY;  Surgeon: Lesly Rubenstein, MD;  Location: [facility] ENDOSCOPY;  Service: Endoscopy;  Laterality: N/A;   ECTOPIC PREGNANCY SURGERY  1957   ESOPHAGOGASTRODUODENOSCOPY N/A 08/19/2020   Procedure: ESOPHAGOGASTRODUODENOSCOPY (EGD);  Surgeon: Lesly Rubenstein, MD;  Location: [facility] ENDOSCOPY;  Service: Endoscopy;  Laterality: N/A;   INJECTION KNEE Left 05/2015   LEFT HEART CATH AND CORONARY ANGIOGRAPHY N/A 10/12/2021   Procedure: LEFT HEART CATH AND CORONARY ANGIOGRAPHY;  Surgeon: Corey Skains,  MD;  Location: [facility];  Service: Cardiovascular;  Laterality: N/A;   REPLACEMENT TOTAL KNEE Left 09/06/2016   Dr. Barnet Pall   TONSILLECTOMY AND ADENOIDECTOMY  1953   TOTAL ABDOMINAL HYSTERECTOMY  1986   DU B   Family History  Problem Relation Age of Onset   Hypertension Mother    Heart disease Father    CAD Father    Heart disease Sister    Diabetes Sister    Diabetes Paternal Uncle    Sleep apnea Son    Social History   Tobacco Use   Smoking status: Never   Smokeless tobacco: Never  Substance Use Topics   Alcohol use: Yes    Alcohol/week: 3.0 standard drinks of alcohol    Types: 3  Shots of liquor per week    Comment: 3 x week    Allergies  Allergen Reactions   Pantoprazole Nausea And Vomiting   Metformin And Related Diarrhea   Prior to Admission medications   Medication Sig Start Date End Date Taking? Authorizing Provider  amiodarone (PACERONE) 200 MG tablet Take 200 mg by mouth daily.  01/02/19  Yes [provider]  clopidogrel (PLAVIX) 75 MG tablet Take 75 mg by mouth daily.   Yes [provider]  desloratadine (CLARINEX) 5 MG tablet Take 1 tablet (5 mg total) by mouth daily. 11/28/22 11/28/23 Yes Dgayli, Berdine Addison, MD  diltiazem (CARDIZEM CD) 240 MG 24 hr capsule Take 240 mg by mouth daily. 11/23/22  Yes [provider]  donepezil (ARICEPT) 10 MG tablet TAKE ONE TABLET (10 MG) BY MOUTH AT BEDTIME 09/19/22  Yes Jon Billings, NP  ELIQUIS 5 MG TABS tablet Take 5 mg by mouth 2 (two) times daily.  05/06/20  Yes [provider]  gabapentin (NEURONTIN) 100 MG capsule Take 100 mg by mouth daily. 05/06/20  Yes [provider]  levothyroxine (SYNTHROID) 88 MCG tablet Take 1 tablet (88 mcg total) by mouth daily. 07/17/22  Yes Jon Billings, NP  metoprolol succinate (TOPROL-XL) 100 MG 24 hr tablet TAKE ONE TABLET (100 MG) BY MOUTH EVERY DAY (TAKE WITH OR IMMEDIATELY AFTER A MEAL) 10/24/22  Yes Jon Billings, NP  pravastatin (PRAVACHOL) 20 MG tablet Take 20 mg by mouth at bedtime. 07/05/20  Yes [provider]  spironolactone (ALDACTONE) 25 MG tablet Take 0.5 tablets (12.5 mg total) by mouth daily. 12/13/22  Yes Lorella Nimrod, MD  torsemide (DEMADEX) 20 MG tablet Take 1 tablet (20 mg total) by mouth daily. 11/03/21  Yes Richarda Osmond, MD  venlafaxine XR (EFFEXOR-XR) 75 MG 24 hr capsule TAKE 1 CAPSULE BY MOUTH ONCE DAILY. 09/19/22  Yes Jon Billings, NP  albuterol (VENTOLIN HFA) 108 (90 Base) MCG/ACT inhaler INHALE 2 PUFFS INTO THE LUNGS EVERY 6 HOURS AS NEEDED FOR WHEEZING OR SHORTNESS OF BREATH Patient not taking:  Reported on 12/27/2022 08/29/22   Jon Billings, NP  budesonide-formoterol [facility]) 160-4.5 MCG/ACT inhaler Inhale 2 puffs into the lungs 2 (two) times daily. Patient not taking: Reported on 12/27/2022 11/28/22   Armando Reichert, MD  dapagliflozin propanediol (FARXIGA) 10 MG TABS tablet Take 1 tablet (10 mg total) by mouth daily before breakfast. Patient not taking: Reported on 12/27/2022 12/12/22   Lorella Nimrod, MD  famotidine (PEPCID) 20 MG tablet TAKE 1 TABLET BY MOUTH ONCE DAILY Patient not taking: Reported on 12/27/2022 10/24/22   Jon Billings, NP  fluticasone [facility]) 50 MCG/ACT nasal spray Place 1 spray into both nostrils daily. Patient not taking: Reported on 12/27/2022  11/28/22 11/28/23  Armando Reichert, MD  glipiZIDE (GLUCOTROL XL) 5 MG 24 hr tablet Take 1 tablet (5 mg total) by mouth daily. Patient not taking: Reported on 12/27/2022 12/12/22   Lorella Nimrod, MD  isosorbide mononitrate (IMDUR) 30 MG 24 hr tablet Take 30 mg by mouth daily. Patient not taking: Reported on 12/27/2022    [provider]  nitroGLYCERIN (NITROSTAT) 0.4 MG SL tablet Place 0.4 mg under the tongue every 5 (five) minutes as needed for chest pain. Patient not taking: Reported on 12/27/2022    [provider]  omeprazole (PRILOSEC) 20 MG capsule TAKE 1 CAPSULE BY MOUTH TWICE DAILY BEFORE A MEAL Patient not taking: Reported on 12/27/2022 12/13/22   Jon Billings, NP    Review of Systems  Constitutional:  Positive for fatigue (easily). Negative for appetite change.  HENT:  Positive for congestion. Negative for postnasal drip and sore throat.   Eyes: Negative.   Respiratory:  Positive for cough (dry cough) and shortness of breath. Negative for chest tightness.   Cardiovascular:  Negative for chest pain, palpitations and leg swelling.  Gastrointestinal:  Negative for abdominal distention and abdominal pain.  Endocrine: Negative.   Genitourinary: Negative.   Musculoskeletal:  Positive for arthralgias  (hip pain). Negative for back pain.  Skin: Negative.   Allergic/Immunologic: Negative.   Neurological:  Positive for light-headedness (when changing positions quickly). Negative for dizziness and headaches.  Hematological:  Negative for adenopathy. Does not bruise/bleed easily.  Psychiatric/Behavioral:  Positive for sleep disturbance (sleeping on 1 pillow). Negative for dysphoric mood. The patient is not nervous/anxious.    Vitals:   12/27/22 1258  BP: 124/69  Pulse: 80  Resp: 16  SpO2: 97%  Weight: 169 lb (76.7 kg)   Wt Readings from Last 3 Encounters:  12/27/22 169 lb (76.7 kg)  12/12/22 175 lb 11.3 oz (79.7 kg)  11/28/22 169 lb (76.7 kg)   Lab Results  Component Value Date   CREATININE 0.84 12/12/2022   CREATININE 0.89 12/11/2022   CREATININE 0.82 12/10/2022   Physical Exam Vitals and nursing note reviewed.  Constitutional:      Appearance: She is well-developed.  HENT:     Head: Normocephalic and atraumatic.     Right Ear: Decreased hearing noted.     Left Ear: Decreased hearing noted.  Cardiovascular:     Rate and Rhythm: Normal rate and regular rhythm.  Pulmonary:     Effort: Pulmonary effort is normal. No respiratory distress.     Breath sounds: No wheezing or rales.  Abdominal:     Palpations: Abdomen is soft.     Tenderness: There is no abdominal tenderness.  Musculoskeletal:     Right lower leg: No tenderness. No edema.     Left lower leg: No tenderness. No edema.  Skin:    General: Skin is warm and dry.  Neurological:     General: No focal deficit present.     Mental Status: She is alert and oriented to person, place, and time.  Psychiatric:        Mood and Affect: Mood normal.        Behavior: Behavior normal.    Assessment & Plan:  1: Chronic heart failure with preserved ejection fraction without LVH/LAE - - NYHA class III - euvolemic today - weighing daily; reminded to call for an overnight weight gain of >2 pounds or a weekly weight gain of  >5 pounds - weight down 4 pounds from last visit here 4 months  ago - not adding additional salt to her food but does eat at the cafeteria for her meals at [facility] - advised to drink between 50-64 ounces of fluids daily - on GDMT of metoprolol; farxiga RX was sent to her pharmacy during her admission; she is going to call [facility] and inquire about the farxiga RX - saw pulmonology (Dgayli) 11/28/22 - BNP 12/11/22 was 521.5  2: HTN- - BP 124/69 - saw PCP Mathis Dad) 11/06/22 - BMP 12/12/22 reviewed and showed sodium 139, potassium 4.5, creatinine 0.84 and GFR >60  3: DM- - A1c 10/15/22 was 7.6%  4: Atrial fibrillation- - saw cardiology (Amaya) 12/26/22 - currently taking amiodarone and apixaban   Pill packs reviewed.   Return in 2 months, sooner if needed

## 2022-12-27 NOTE — Patient Instructions (Addendum)
Continue weighing daily and call for an overnight weight gain of 3 pounds or more or a weekly weight gain of more than 5 pounds.   If you have voicemail, please make sure your mailbox is cleaned out so that we may leave a message and please make sure to listen to any voicemails.    Check your next pill packs and make sure the farxiga '10mg'$  is in there.

## 2022-12-28 ENCOUNTER — Inpatient Hospital Stay: Payer: Medicare PPO | Admitting: Nurse Practitioner

## 2022-12-28 DIAGNOSIS — J4540 Moderate persistent asthma, uncomplicated: Secondary | ICD-10-CM

## 2022-12-28 DIAGNOSIS — I5033 Acute on chronic diastolic (congestive) heart failure: Secondary | ICD-10-CM

## 2022-12-28 DIAGNOSIS — E1169 Type 2 diabetes mellitus with other specified complication: Secondary | ICD-10-CM

## 2022-12-28 DIAGNOSIS — E039 Hypothyroidism, unspecified: Secondary | ICD-10-CM

## 2022-12-28 DIAGNOSIS — E1142 Type 2 diabetes mellitus with diabetic polyneuropathy: Secondary | ICD-10-CM

## 2022-12-28 DIAGNOSIS — F0280 Dementia in other diseases classified elsewhere without behavioral disturbance: Secondary | ICD-10-CM

## 2022-12-28 DIAGNOSIS — I482 Chronic atrial fibrillation, unspecified: Secondary | ICD-10-CM

## 2022-12-28 DIAGNOSIS — A419 Sepsis, unspecified organism: Secondary | ICD-10-CM

## 2022-12-28 DIAGNOSIS — E1159 Type 2 diabetes mellitus with other circulatory complications: Secondary | ICD-10-CM

## 2022-12-28 DIAGNOSIS — J189 Pneumonia, unspecified organism: Secondary | ICD-10-CM

## 2023-01-08 ENCOUNTER — Other Ambulatory Visit: Payer: Self-pay | Admitting: Student in an Organized Health Care Education/Training Program

## 2023-01-08 ENCOUNTER — Other Ambulatory Visit: Payer: Self-pay | Admitting: Nurse Practitioner

## 2023-01-08 DIAGNOSIS — R279 Unspecified lack of coordination: Secondary | ICD-10-CM | POA: Diagnosis not present

## 2023-01-08 DIAGNOSIS — I480 Paroxysmal atrial fibrillation: Secondary | ICD-10-CM | POA: Diagnosis not present

## 2023-01-08 DIAGNOSIS — G301 Alzheimer's disease with late onset: Secondary | ICD-10-CM | POA: Diagnosis not present

## 2023-01-08 DIAGNOSIS — J189 Pneumonia, unspecified organism: Secondary | ICD-10-CM | POA: Diagnosis not present

## 2023-01-08 DIAGNOSIS — M6281 Muscle weakness (generalized): Secondary | ICD-10-CM | POA: Diagnosis not present

## 2023-01-08 DIAGNOSIS — A419 Sepsis, unspecified organism: Secondary | ICD-10-CM | POA: Diagnosis not present

## 2023-01-08 DIAGNOSIS — R053 Chronic cough: Secondary | ICD-10-CM

## 2023-01-08 DIAGNOSIS — R2689 Other abnormalities of gait and mobility: Secondary | ICD-10-CM | POA: Diagnosis not present

## 2023-01-08 DIAGNOSIS — R0602 Shortness of breath: Secondary | ICD-10-CM

## 2023-01-09 DIAGNOSIS — G301 Alzheimer's disease with late onset: Secondary | ICD-10-CM | POA: Diagnosis not present

## 2023-01-09 DIAGNOSIS — A419 Sepsis, unspecified organism: Secondary | ICD-10-CM | POA: Diagnosis not present

## 2023-01-09 DIAGNOSIS — R2689 Other abnormalities of gait and mobility: Secondary | ICD-10-CM | POA: Diagnosis not present

## 2023-01-09 DIAGNOSIS — M6281 Muscle weakness (generalized): Secondary | ICD-10-CM | POA: Diagnosis not present

## 2023-01-09 DIAGNOSIS — R279 Unspecified lack of coordination: Secondary | ICD-10-CM | POA: Diagnosis not present

## 2023-01-09 DIAGNOSIS — J189 Pneumonia, unspecified organism: Secondary | ICD-10-CM | POA: Diagnosis not present

## 2023-01-09 DIAGNOSIS — I480 Paroxysmal atrial fibrillation: Secondary | ICD-10-CM | POA: Diagnosis not present

## 2023-01-09 MED ORDER — GABAPENTIN 100 MG PO CAPS: 100.0000 mg | ORAL_CAPSULE | Freq: Every day | ORAL | 1 refills | Status: DC

## 2023-01-09 NOTE — Telephone Encounter (Signed)
According to chart, Gabapentin is due for refill. RX t'd up.

## 2023-01-09 NOTE — Addendum Note (Signed)
Addended by: Georgina Peer on: 01/09/2023 11:46 AM   Modules accepted: Orders

## 2023-01-09 NOTE — Telephone Encounter (Signed)
Unable to refill per protocol, Rx request are too soon.  Requested Prescriptions  Pending Prescriptions Disp Refills   donepezil (ARICEPT) 10 MG tablet [Pharmacy Med Name: DONEPEZIL HCL 10 MG TAB] 90 tablet 1    Sig: TAKE ONE TABLET (10 MG) BY MOUTH AT BEDTIME     Neurology:  Alzheimer's Agents Passed - 01/08/2023  3:38 PM      Passed - Valid encounter within last 6 months    Recent Outpatient Visits           2 months ago [place], Karen, NP   2 months ago Annual physical exam   [facility] Jon Billings, NP   4 months ago Viral upper respiratory tract infection   [facility] Jon Billings, NP   6 months ago Hypertension associated with diabetes [facility])   [facility] Jon Billings, NP   9 months ago Chronic diastolic CHF (congestive heart failure) ([place])   [place], NP       Future Appointments             In 6 days Jon Billings, NP [place]             venlafaxine XR (EFFEXOR-XR) 75 MG 24 hr capsule [Pharmacy Med Name: VENLAFAXINE HCL ER 75 MG CAP] 90 capsule 1    Sig: TAKE 1 CAPSULE BY MOUTH ONCE DAILY.     Psychiatry: Antidepressants - SNRI - desvenlafaxine & venlafaxine Failed - 01/08/2023  3:38 PM      Failed - Lipid Panel in normal range within the last 12 months    Cholesterol, Total  Date Value Ref Range Status  10/15/2022 171 100 - 199 mg/dL Final   LDL Chol Calc (NIH)  Date Value Ref Range Status  10/15/2022 91 0 - 99 mg/dL Final   HDL  Date Value Ref Range Status  10/15/2022 42 >39 mg/dL Final   Triglycerides  Date Value Ref Range Status  10/15/2022 228 (H) 0 - 149 mg/dL Final         Passed - Cr in normal range and within 360 days    Creatinine, Ser  Date Value Ref Range Status  12/12/2022 0.84 0.44 - 1.00 mg/dL Final         Passed - Completed PHQ-2 or PHQ-9 in the last 360 days      Passed -  Last BP in normal range    BP Readings from Last 1 Encounters:  12/27/22 124/69         Passed - Valid encounter within last 6 months    Recent Outpatient Visits           2 months ago [place], NP   2 months ago Annual physical exam   [facility] Jon Billings, NP   4 months ago Viral upper respiratory tract infection   [facility] Jon Billings, NP   6 months ago Hypertension associated with diabetes [facility])   [facility] Jon Billings, NP   9 months ago Chronic diastolic CHF (congestive heart failure) ([place])   [facility] Jon Billings, NP       Future Appointments             In 6 days Jon Billings, NP [facility], [facility]             metoprolol succinate (TOPROL-XL) 100 MG  24 hr tablet [Pharmacy Med Name: METOPROLOL SUCCINATE ER 100 MG TAB] 90 tablet 0    Sig: TAKE ONE TABLET (100 MG) BY MOUTH EVERY DAY (TAKE WITH OR IMMEDIATELY AFTER A MEAL)     Cardiovascular:  Beta Blockers Passed - 01/08/2023  3:38 PM      Passed - Last BP in normal range    BP Readings from Last 1 Encounters:  12/27/22 124/69         Passed - Last Heart Rate in normal range    Pulse Readings from Last 1 Encounters:  12/27/22 80         Passed - Valid encounter within last 6 months    Recent Outpatient Visits           2 months ago [place] Jon Billings, NP   2 months ago Annual physical exam   [facility] Jon Billings, NP   4 months ago Viral upper respiratory tract infection   [facility] Jon Billings, NP   6 months ago Hypertension associated with diabetes [facility])   [facility] Jon Billings, NP   9 months ago Chronic diastolic CHF (congestive heart failure) ([place])   [name] Jon Billings, NP       Future Appointments             In 6 days  Jon Billings, NP [place], [facility]             gabapentin (NEURONTIN) 100 MG capsule [Pharmacy Med Name: GABAPENTIN 100 MG CAP] 60 capsule     Sig: TAKE 1 CAPSULE BY MOUTH 2 TIMES DAILY     Neurology: Anticonvulsants - gabapentin Passed - 01/08/2023  3:38 PM      Passed - Cr in normal range and within 360 days    Creatinine, Ser  Date Value Ref Range Status  12/12/2022 0.84 0.44 - 1.00 mg/dL Final         Passed - Completed PHQ-2 or PHQ-9 in the last 360 days      Passed - Valid encounter within last 12 months    Recent Outpatient Visits           2 months ago [place], NP   2 months ago Annual physical exam   [facility] Jon Billings, NP   4 months ago Viral upper respiratory tract infection   [facility] Jon Billings, NP   6 months ago Hypertension associated with diabetes [facility])   [facility] Jon Billings, NP   9 months ago Chronic diastolic CHF (congestive heart failure) ([place])   [facility] Jon Billings, NP       Future Appointments             In 6 days Jon Billings, NP [facility] [place]

## 2023-01-09 NOTE — Telephone Encounter (Signed)
Pharmacy tech called reporting that the patient is due for refills this Friday. They are composing her bubble packs for her now and want to know why the request for gabapentin is too soon? Please advise   Best contact: McKenzie [phone]

## 2023-01-15 ENCOUNTER — Ambulatory Visit: Payer: Medicare PPO | Admitting: Nurse Practitioner

## 2023-01-15 ENCOUNTER — Encounter: Payer: Self-pay | Admitting: Nurse Practitioner

## 2023-01-15 VITALS — BP 109/66 | HR 77 | Temp 98.0°F | Wt 170.4 lb

## 2023-01-15 DIAGNOSIS — I152 Hypertension secondary to endocrine disorders: Secondary | ICD-10-CM | POA: Diagnosis not present

## 2023-01-15 DIAGNOSIS — I5032 Chronic diastolic (congestive) heart failure: Secondary | ICD-10-CM | POA: Diagnosis not present

## 2023-01-15 DIAGNOSIS — E1169 Type 2 diabetes mellitus with other specified complication: Secondary | ICD-10-CM | POA: Diagnosis not present

## 2023-01-15 DIAGNOSIS — E1142 Type 2 diabetes mellitus with diabetic polyneuropathy: Secondary | ICD-10-CM

## 2023-01-15 DIAGNOSIS — E1159 Type 2 diabetes mellitus with other circulatory complications: Secondary | ICD-10-CM | POA: Diagnosis not present

## 2023-01-15 DIAGNOSIS — E785 Hyperlipidemia, unspecified: Secondary | ICD-10-CM

## 2023-01-15 NOTE — Progress Notes (Unsigned)
Wt 170 lb 6.4 oz (77.3 kg)   BMI 27.50 kg/m    Subjective:    Patient ID: Alison Richardson, female    DOB: July 22, 1933, 89 y.o.   MRN: [id number]  HPI: Alison Richardson is a 89 y.o. female  Chief Complaint  Patient presents with   Hospitalization Follow-up    CHF EXACERBATION with Multifocal pneumonia Patient states she is feeling a lot better. She was hospitalized with COVID/Pneumonia/CHF.  She has already seen Cardiology and HF clinic.   HYPERTENSION with Chronic Kidney Disease Hypertension status: controlled  Satisfied with current treatment? yes Duration of hypertension: years BP monitoring frequency:  not checking BP range:  BP medication side effects:  no Medication compliance: excellent compliance Previous BP meds: torsemide, metoprolol and cardizem Aspirin: no Recurrent headaches: no Visual changes: no Palpitations: no Dyspnea: no Chest pain: no Lower extremity edema: no Dizzy/lightheaded: no  DIABETES Has not started the [place] yet.  Was changed from [place] in the hospital.   Hypoglycemic episodes:no Polydipsia/polyuria: no Visual disturbance: no Chest pain: no Paresthesias: no Glucose Monitoring: yes  Accucheck frequency: Daily  Fasting glucose: >200  Post prandial:  Evening:  Before meals: Taking Insulin?: no  Long acting insulin:  Short acting insulin:  Relevant past medical, surgical, family and social history reviewed and updated as indicated. Interim medical history since our last visit reviewed. Allergies and medications reviewed and updated.  Review of Systems  Per HPI unless specifically indicated above     Objective:    Wt 170 lb 6.4 oz (77.3 kg)   BMI 27.50 kg/m   Wt Readings from Last 3 Encounters:  01/15/23 170 lb 6.4 oz (77.3 kg)  12/27/22 169 lb (76.7 kg)  12/12/22 175 lb 11.3 oz (79.7 kg)    Physical Exam  Results for orders placed or performed during the hospital encounter of 12/09/22  Resp panel by RT-PCR (RSV, Flu  A&B, Covid) Anterior Nasal Swab   Specimen: Anterior Nasal Swab  Result Value Ref Range   SARS Coronavirus 2 by RT PCR NEGATIVE NEGATIVE   Influenza A by PCR NEGATIVE NEGATIVE   Influenza B by PCR NEGATIVE NEGATIVE   Resp Syncytial Virus by PCR NEGATIVE NEGATIVE  Culture, blood (routine x 2)   Specimen: BLOOD LEFT WRIST  Result Value Ref Range   Specimen Description BLOOD LEFT WRIST    Special Requests      BOTTLES DRAWN AEROBIC AND ANAEROBIC Blood Culture adequate volume   Culture      NO GROWTH 5 DAYS Performed at [facility], [address] [place], [place] [address]    Report Status 12/14/2022 FINAL   Culture, blood (routine x 2)   Specimen: BLOOD RIGHT HAND  Result Value Ref Range   Specimen Description BLOOD RIGHT HAND    Special Requests      BOTTLES DRAWN AEROBIC AND ANAEROBIC Blood Culture adequate volume   Culture      NO GROWTH 5 DAYS Performed at [facility], [address] [place],  [address]    Report Status 12/14/2022 FINAL   CBC with Differential  Result Value Ref Range   WBC 16.1 (H) 4.0 - 10.5 K/uL   RBC 4.75 3.87 - 5.11 MIL/uL   Hemoglobin 12.9 12.0 - 15.0 g/dL   HCT 41.1 36.0 - 46.0 %   MCV 86.5 80.0 - 100.0 fL   MCH 27.2 26.0 - 34.0 pg   MCHC 31.4 30.0 - 36.0 g/dL   RDW 14.8 11.5 -  15.5 %   Platelets 306 150 - 400 K/uL   nRBC 0.0 0.0 - 0.2 %   Neutrophils Relative % 79 %   Neutro Abs 12.8 (H) 1.7 - 7.7 K/uL   Lymphocytes Relative 12 %   Lymphs Abs 2.0 0.7 - 4.0 K/uL   Monocytes Relative 6 %   Monocytes Absolute 0.9 0.1 - 1.0 K/uL   Eosinophils Relative 1 %   Eosinophils Absolute 0.2 0.0 - 0.5 K/uL   Basophils Relative 1 %   Basophils Absolute 0.1 0.0 - 0.1 K/uL   Immature Granulocytes 1 %   Abs Immature Granulocytes 0.10 (H) 0.00 - 0.07 K/uL  Comprehensive metabolic panel  Result Value Ref Range   Sodium 141 135 - 145 mmol/L   Potassium 3.1 (L) 3.5 - 5.1 mmol/L   Chloride 109 98 - 111 mmol/L   CO2 24 22 - 32  mmol/L   Glucose, Bld 210 (H) 70 - 99 mg/dL   BUN 14 8 - 23 mg/dL   Creatinine, Ser 0.65 0.44 - 1.00 mg/dL   Calcium 9.0 8.9 - 10.3 mg/dL   Total Protein 7.8 6.5 - 8.1 g/dL   Albumin 4.3 3.5 - 5.0 g/dL   AST 63 (H) 15 - 41 U/L   ALT 55 (H) 0 - 44 U/L   Alkaline Phosphatase 113 38 - 126 U/L   Total Bilirubin 1.1 0.3 - 1.2 mg/dL   GFR, Estimated >60 >60 mL/min   Anion gap 8 5 - 15  Brain natriuretic peptide  Result Value Ref Range   B Natriuretic Peptide 407.6 (H) 0.0 - 100.0 pg/mL  Lactic acid, plasma  Result Value Ref Range   Lactic Acid, Venous 2.9 (HH) 0.5 - 1.9 mmol/L  Lactic acid, plasma  Result Value Ref Range   Lactic Acid, Venous 5.2 (HH) 0.5 - 1.9 mmol/L  Procalcitonin - Baseline  Result Value Ref Range   Procalcitonin <0.10 ng/mL  Magnesium  Result Value Ref Range   Magnesium 2.2 1.7 - 2.4 mg/dL  Legionella Pneumophila Serogp 1 Ur Ag  Result Value Ref Range   L. pneumophila Serogp 1 Ur Ag Negative Negative   Source of Sample URINE, RANDOM   Strep pneumoniae urinary antigen  Result Value Ref Range   Strep Pneumo Urinary Antigen NEGATIVE NEGATIVE  Magnesium  Result Value Ref Range   Magnesium 2.0 1.7 - 2.4 mg/dL  Basic metabolic panel  Result Value Ref Range   Sodium 137 135 - 145 mmol/L   Potassium 4.7 3.5 - 5.1 mmol/L   Chloride 106 98 - 111 mmol/L   CO2 22 22 - 32 mmol/L   Glucose, Bld 271 (H) 70 - 99 mg/dL   BUN 20 8 - 23 mg/dL   Creatinine, Ser 0.82 0.44 - 1.00 mg/dL   Calcium 9.0 8.9 - 10.3 mg/dL   GFR, Estimated >60 >60 mL/min   Anion gap 9 5 - 15  Lactic acid, plasma  Result Value Ref Range   Lactic Acid, Venous 4.2 (HH) 0.5 - 1.9 mmol/L  CBC  Result Value Ref Range   WBC 21.3 (H) 4.0 - 10.5 K/uL   RBC 4.03 3.87 - 5.11 MIL/uL   Hemoglobin 11.0 (L) 12.0 - 15.0 g/dL   HCT 35.0 (L) 36.0 - 46.0 %   MCV 86.8 80.0 - 100.0 fL   MCH 27.3 26.0 - 34.0 pg   MCHC 31.4 30.0 - 36.0 g/dL   RDW 15.8 (H) 11.5 - 15.5 %   Platelets 321  150 - 400 K/uL   nRBC  0.0 0.0 - 0.2 %  Procalcitonin  Result Value Ref Range   Procalcitonin <0.10 ng/mL  Glucose, capillary  Result Value Ref Range   Glucose-Capillary 239 (H) 70 - 99 mg/dL  Glucose, capillary  Result Value Ref Range   Glucose-Capillary 221 (H) 70 - 99 mg/dL  Lactic acid, plasma  Result Value Ref Range   Lactic Acid, Venous 3.1 (HH) 0.5 - 1.9 mmol/L  Lactic acid, plasma  Result Value Ref Range   Lactic Acid, Venous 2.9 (HH) 0.5 - 1.9 mmol/L  Glucose, capillary  Result Value Ref Range   Glucose-Capillary 208 (H) 70 - 99 mg/dL  Fungitell, Serum (as Misc Send Out)  Result Value Ref Range   [facility] test code [id number]    [facility] test name FUNGITELL BETA    Misc [facility] result COMMENT   CBC  Result Value Ref Range   WBC 17.3 (H) 4.0 - 10.5 K/uL   RBC 3.91 3.87 - 5.11 MIL/uL   Hemoglobin 10.8 (L) 12.0 - 15.0 g/dL   HCT 34.3 (L) 36.0 - 46.0 %   MCV 87.7 80.0 - 100.0 fL   MCH 27.6 26.0 - 34.0 pg   MCHC 31.5 30.0 - 36.0 g/dL   RDW 15.9 (H) 11.5 - 15.5 %   Platelets 313 150 - 400 K/uL   nRBC 0.0 0.0 - 0.2 %  Basic metabolic panel  Result Value Ref Range   Sodium 140 135 - 145 mmol/L   Potassium 5.4 (H) 3.5 - 5.1 mmol/L   Chloride 108 98 - 111 mmol/L   CO2 27 22 - 32 mmol/L   Glucose, Bld 235 (H) 70 - 99 mg/dL   BUN 23 8 - 23 mg/dL   Creatinine, Ser 0.89 0.44 - 1.00 mg/dL   Calcium 8.9 8.9 - 10.3 mg/dL   GFR, Estimated >60 >60 mL/min   Anion gap 5 5 - 15  Sedimentation rate  Result Value Ref Range   Sed Rate 10 0 - 30 mm/hr  C-reactive protein  Result Value Ref Range   CRP 1.9 (H) <1.0 mg/dL  Brain natriuretic peptide  Result Value Ref Range   B Natriuretic Peptide 521.5 (H) 0.0 - 100.0 pg/mL  Glucose, capillary  Result Value Ref Range   Glucose-Capillary 196 (H) 70 - 99 mg/dL  Glucose, capillary  Result Value Ref Range   Glucose-Capillary 238 (H) 70 - 99 mg/dL  Glucose, capillary  Result Value Ref Range   Glucose-Capillary 240 (H) 70 - 99 mg/dL  Glucose, capillary   Result Value Ref Range   Glucose-Capillary 253 (H) 70 - 99 mg/dL  Lactic acid, plasma  Result Value Ref Range   Lactic Acid, Venous 3.1 (HH) 0.5 - 1.9 mmol/L  Glucose, capillary  Result Value Ref Range   Glucose-Capillary 155 (H) 70 - 99 mg/dL  Basic metabolic panel  Result Value Ref Range   Sodium 139 135 - 145 mmol/L   Potassium 4.5 3.5 - 5.1 mmol/L   Chloride 108 98 - 111 mmol/L   CO2 24 22 - 32 mmol/L   Glucose, Bld 268 (H) 70 - 99 mg/dL   BUN 30 (H) 8 - 23 mg/dL   Creatinine, Ser 0.84 0.44 - 1.00 mg/dL   Calcium 8.6 (L) 8.9 - 10.3 mg/dL   GFR, Estimated >60 >60 mL/min   Anion gap 7 5 - 15  CBC  Result Value Ref Range   WBC 16.3 (H) 4.0 - 10.5 K/uL  RBC 4.10 3.87 - 5.11 MIL/uL   Hemoglobin 11.1 (L) 12.0 - 15.0 g/dL   HCT 35.4 (L) 36.0 - 46.0 %   MCV 86.3 80.0 - 100.0 fL   MCH 27.1 26.0 - 34.0 pg   MCHC 31.4 30.0 - 36.0 g/dL   RDW 15.5 11.5 - 15.5 %   Platelets 317 150 - 400 K/uL   nRBC 0.0 0.0 - 0.2 %  Glucose, capillary  Result Value Ref Range   Glucose-Capillary 217 (H) 70 - 99 mg/dL  Glucose, capillary  Result Value Ref Range   Glucose-Capillary 230 (H) 70 - 99 mg/dL  Glucose, capillary  Result Value Ref Range   Glucose-Capillary 217 (H) 70 - 99 mg/dL  CBG monitoring, ED  Result Value Ref Range   Glucose-Capillary 271 (H) 70 - 99 mg/dL  CBG monitoring, ED  Result Value Ref Range   Glucose-Capillary 301 (H) 70 - 99 mg/dL  CBG monitoring, ED  Result Value Ref Range   Glucose-Capillary 224 (H) 70 - 99 mg/dL  CBG monitoring, ED  Result Value Ref Range   Glucose-Capillary 248 (H) 70 - 99 mg/dL  CBG monitoring, ED  Result Value Ref Range   Glucose-Capillary 267 (H) 70 - 99 mg/dL  ECHOCARDIOGRAM COMPLETE  Result Value Ref Range   Weight 3,449.76 oz   Height 66 in   BP 136/116 mmHg   Ao pk vel 1.03 m/s   AV Area VTI 1.91 cm2   AR max vel 1.93 cm2   AV Mean grad 2.0 mmHg   AV Peak grad 4.2 mmHg   S' Lateral 3.80 cm   AV Area mean vel 1.94 cm2    Area-P 1/2 6.27 cm2  Troponin I (High Sensitivity)  Result Value Ref Range   Troponin I (High Sensitivity) 8 <18 ng/L  Troponin I (High Sensitivity)  Result Value Ref Range   Troponin I (High Sensitivity) 9 <18 ng/L      Assessment & Plan:   Problem List Items Addressed This Visit   None    Follow up plan: No follow-ups on file.

## 2023-01-16 LAB — COMPREHENSIVE METABOLIC PANEL
ALT: 21 IU/L (ref 0–32)
AST: 22 IU/L (ref 0–40)
Albumin/Globulin Ratio: 2 (ref 1.2–2.2)
Albumin: 4.8 g/dL — ABNORMAL HIGH (ref 3.7–4.7)
Alkaline Phosphatase: 131 IU/L — ABNORMAL HIGH (ref 44–121)
BUN/Creatinine Ratio: 19 (ref 12–28)
BUN: 18 mg/dL (ref 8–27)
Bilirubin Total: 0.2 mg/dL (ref 0.0–1.2)
CO2: 20 mmol/L (ref 20–29)
Calcium: 9.6 mg/dL (ref 8.7–10.3)
Chloride: 103 mmol/L (ref 96–106)
Creatinine, Ser: 0.96 mg/dL (ref 0.57–1.00)
Globulin, Total: 2.4 g/dL (ref 1.5–4.5)
Glucose: 150 mg/dL — ABNORMAL HIGH (ref 70–99)
Potassium: 4.2 mmol/L (ref 3.5–5.2)
Sodium: 141 mmol/L (ref 134–144)
Total Protein: 7.2 g/dL (ref 6.0–8.5)
eGFR: 57 mL/min/{1.73_m2} — ABNORMAL LOW (ref >59)

## 2023-01-16 LAB — HEMOGLOBIN A1C
Est. average glucose Bld gHb Est-mCnc: 177 mg/dL
Hgb A1c MFr Bld: 7.8 % — ABNORMAL HIGH (ref 4.8–5.6)

## 2023-01-16 LAB — LIPID PANEL
Chol/HDL Ratio: 3.8 ratio (ref 0.0–4.4)
Cholesterol, Total: 189 mg/dL (ref 100–199)
HDL: 50 mg/dL (ref >39)
LDL Chol Calc (NIH): 93 mg/dL (ref 0–99)
Triglycerides: 273 mg/dL — ABNORMAL HIGH (ref 0–149)
VLDL Cholesterol Cal: 46 mg/dL — ABNORMAL HIGH (ref 5–40)

## 2023-01-16 NOTE — Assessment & Plan Note (Signed)
Chronic, Improved since hospitalization. Continue to collaborate with cardiology.  Alison Richardson was changed to Alison Richardson.  Encouraged patient to start medication.  Followed by HF clinic.  Now seeing Cardiology, Dr. Josefa Half.  Recommend: - Reminded to call for an overnight weight gain of >2 pounds or a weekly weight gain of >5 pounds - not adding salt to food and read food labels. Reviewed the importance of keeping daily sodium intake to '2000mg'$  daily. - Avoid Ibuprofen products.

## 2023-01-16 NOTE — Progress Notes (Signed)
Please let patient know that her lab work looks good.  A1c did increase slightly to 7.8.  I think the Wilder Glade will help with this when she starts it.  Make sure to drink plenty of water. Continue with current medication regimen.  Follow up as discussed.

## 2023-01-16 NOTE — Assessment & Plan Note (Signed)
Chronic.  Controlled.  Continue with current medication regimen of Pravastatin '20mg'$  daily.  Labs ordered today.  Return to clinic in 6 months for reevaluation.  Call sooner if concerns arise.

## 2023-01-16 NOTE — Assessment & Plan Note (Signed)
Chronic, Improved since hospitalization. Continue to collaborate with cardiology.  Vania Rea was changed to [place].  Encouraged patient to start medication.  Followed by HF clinic.  Now seeing Cardiology, Dr. Josefa Half.  Recommend: - Reminded to call for an overnight weight gain of >2 pounds or a weekly weight gain of >5 pounds - not adding salt to food and read food labels. Reviewed the importance of keeping daily sodium intake to '2000mg'$  daily. - Avoid Ibuprofen products.

## 2023-01-16 NOTE — Assessment & Plan Note (Signed)
Chronic. Labs ordered today.  Last A1c 7.1.  Discussed starting the Farxiga due to sugars being elevated.  Will make recommendations based on lab results.  Follow up in 3 months. Call sooner if concerns arise.

## 2023-01-17 DIAGNOSIS — G301 Alzheimer's disease with late onset: Secondary | ICD-10-CM | POA: Diagnosis not present

## 2023-01-17 DIAGNOSIS — J189 Pneumonia, unspecified organism: Secondary | ICD-10-CM | POA: Diagnosis not present

## 2023-01-17 DIAGNOSIS — I480 Paroxysmal atrial fibrillation: Secondary | ICD-10-CM | POA: Diagnosis not present

## 2023-01-17 DIAGNOSIS — R279 Unspecified lack of coordination: Secondary | ICD-10-CM | POA: Diagnosis not present

## 2023-01-17 DIAGNOSIS — R2689 Other abnormalities of gait and mobility: Secondary | ICD-10-CM | POA: Diagnosis not present

## 2023-01-17 DIAGNOSIS — A419 Sepsis, unspecified organism: Secondary | ICD-10-CM | POA: Diagnosis not present

## 2023-01-17 DIAGNOSIS — M6281 Muscle weakness (generalized): Secondary | ICD-10-CM | POA: Diagnosis not present

## 2023-01-18 DIAGNOSIS — R279 Unspecified lack of coordination: Secondary | ICD-10-CM | POA: Diagnosis not present

## 2023-01-18 DIAGNOSIS — R2689 Other abnormalities of gait and mobility: Secondary | ICD-10-CM | POA: Diagnosis not present

## 2023-01-18 DIAGNOSIS — I480 Paroxysmal atrial fibrillation: Secondary | ICD-10-CM | POA: Diagnosis not present

## 2023-01-18 DIAGNOSIS — J189 Pneumonia, unspecified organism: Secondary | ICD-10-CM | POA: Diagnosis not present

## 2023-01-18 DIAGNOSIS — M6281 Muscle weakness (generalized): Secondary | ICD-10-CM | POA: Diagnosis not present

## 2023-01-18 DIAGNOSIS — A419 Sepsis, unspecified organism: Secondary | ICD-10-CM | POA: Diagnosis not present

## 2023-01-18 DIAGNOSIS — G301 Alzheimer's disease with late onset: Secondary | ICD-10-CM | POA: Diagnosis not present

## 2023-01-21 DIAGNOSIS — A419 Sepsis, unspecified organism: Secondary | ICD-10-CM | POA: Diagnosis not present

## 2023-01-21 DIAGNOSIS — R2689 Other abnormalities of gait and mobility: Secondary | ICD-10-CM | POA: Diagnosis not present

## 2023-01-21 DIAGNOSIS — G301 Alzheimer's disease with late onset: Secondary | ICD-10-CM | POA: Diagnosis not present

## 2023-01-21 DIAGNOSIS — I480 Paroxysmal atrial fibrillation: Secondary | ICD-10-CM | POA: Diagnosis not present

## 2023-01-21 DIAGNOSIS — J189 Pneumonia, unspecified organism: Secondary | ICD-10-CM | POA: Diagnosis not present

## 2023-01-21 DIAGNOSIS — R279 Unspecified lack of coordination: Secondary | ICD-10-CM | POA: Diagnosis not present

## 2023-01-21 DIAGNOSIS — M6281 Muscle weakness (generalized): Secondary | ICD-10-CM | POA: Diagnosis not present

## 2023-01-23 DIAGNOSIS — R2689 Other abnormalities of gait and mobility: Secondary | ICD-10-CM | POA: Diagnosis not present

## 2023-01-23 DIAGNOSIS — M6281 Muscle weakness (generalized): Secondary | ICD-10-CM | POA: Diagnosis not present

## 2023-01-23 DIAGNOSIS — I480 Paroxysmal atrial fibrillation: Secondary | ICD-10-CM | POA: Diagnosis not present

## 2023-01-23 DIAGNOSIS — R279 Unspecified lack of coordination: Secondary | ICD-10-CM | POA: Diagnosis not present

## 2023-01-23 DIAGNOSIS — A419 Sepsis, unspecified organism: Secondary | ICD-10-CM | POA: Diagnosis not present

## 2023-01-23 DIAGNOSIS — J189 Pneumonia, unspecified organism: Secondary | ICD-10-CM | POA: Diagnosis not present

## 2023-01-23 DIAGNOSIS — G301 Alzheimer's disease with late onset: Secondary | ICD-10-CM | POA: Diagnosis not present

## 2023-01-30 DIAGNOSIS — I480 Paroxysmal atrial fibrillation: Secondary | ICD-10-CM | POA: Diagnosis not present

## 2023-01-30 DIAGNOSIS — R279 Unspecified lack of coordination: Secondary | ICD-10-CM | POA: Diagnosis not present

## 2023-01-30 DIAGNOSIS — R2689 Other abnormalities of gait and mobility: Secondary | ICD-10-CM | POA: Diagnosis not present

## 2023-01-30 DIAGNOSIS — M6281 Muscle weakness (generalized): Secondary | ICD-10-CM | POA: Diagnosis not present

## 2023-01-30 DIAGNOSIS — J189 Pneumonia, unspecified organism: Secondary | ICD-10-CM | POA: Diagnosis not present

## 2023-01-30 DIAGNOSIS — A419 Sepsis, unspecified organism: Secondary | ICD-10-CM | POA: Diagnosis not present

## 2023-01-30 DIAGNOSIS — G301 Alzheimer's disease with late onset: Secondary | ICD-10-CM | POA: Diagnosis not present

## 2023-02-01 DIAGNOSIS — J189 Pneumonia, unspecified organism: Secondary | ICD-10-CM | POA: Diagnosis not present

## 2023-02-01 DIAGNOSIS — G301 Alzheimer's disease with late onset: Secondary | ICD-10-CM | POA: Diagnosis not present

## 2023-02-01 DIAGNOSIS — I480 Paroxysmal atrial fibrillation: Secondary | ICD-10-CM | POA: Diagnosis not present

## 2023-02-01 DIAGNOSIS — R279 Unspecified lack of coordination: Secondary | ICD-10-CM | POA: Diagnosis not present

## 2023-02-01 DIAGNOSIS — R2689 Other abnormalities of gait and mobility: Secondary | ICD-10-CM | POA: Diagnosis not present

## 2023-02-01 DIAGNOSIS — M6281 Muscle weakness (generalized): Secondary | ICD-10-CM | POA: Diagnosis not present

## 2023-02-01 DIAGNOSIS — A419 Sepsis, unspecified organism: Secondary | ICD-10-CM | POA: Diagnosis not present

## 2023-02-04 DIAGNOSIS — G301 Alzheimer's disease with late onset: Secondary | ICD-10-CM | POA: Diagnosis not present

## 2023-02-04 DIAGNOSIS — I480 Paroxysmal atrial fibrillation: Secondary | ICD-10-CM | POA: Diagnosis not present

## 2023-02-04 DIAGNOSIS — M6281 Muscle weakness (generalized): Secondary | ICD-10-CM | POA: Diagnosis not present

## 2023-02-04 DIAGNOSIS — R279 Unspecified lack of coordination: Secondary | ICD-10-CM | POA: Diagnosis not present

## 2023-02-04 DIAGNOSIS — A419 Sepsis, unspecified organism: Secondary | ICD-10-CM | POA: Diagnosis not present

## 2023-02-04 DIAGNOSIS — J189 Pneumonia, unspecified organism: Secondary | ICD-10-CM | POA: Diagnosis not present

## 2023-02-04 DIAGNOSIS — R2689 Other abnormalities of gait and mobility: Secondary | ICD-10-CM | POA: Diagnosis not present

## 2023-02-06 DIAGNOSIS — M5416 Radiculopathy, lumbar region: Secondary | ICD-10-CM | POA: Insufficient documentation

## 2023-02-06 DIAGNOSIS — A419 Sepsis, unspecified organism: Secondary | ICD-10-CM | POA: Diagnosis not present

## 2023-02-06 DIAGNOSIS — I480 Paroxysmal atrial fibrillation: Secondary | ICD-10-CM | POA: Diagnosis not present

## 2023-02-06 DIAGNOSIS — R488 Other symbolic dysfunctions: Secondary | ICD-10-CM | POA: Diagnosis not present

## 2023-02-06 DIAGNOSIS — G3184 Mild cognitive impairment, so stated: Secondary | ICD-10-CM | POA: Diagnosis not present

## 2023-02-06 DIAGNOSIS — J189 Pneumonia, unspecified organism: Secondary | ICD-10-CM | POA: Diagnosis not present

## 2023-02-06 DIAGNOSIS — R279 Unspecified lack of coordination: Secondary | ICD-10-CM | POA: Diagnosis not present

## 2023-02-06 DIAGNOSIS — G301 Alzheimer's disease with late onset: Secondary | ICD-10-CM | POA: Diagnosis not present

## 2023-02-06 DIAGNOSIS — M6281 Muscle weakness (generalized): Secondary | ICD-10-CM | POA: Diagnosis not present

## 2023-02-06 DIAGNOSIS — R2689 Other abnormalities of gait and mobility: Secondary | ICD-10-CM | POA: Diagnosis not present

## 2023-02-08 ENCOUNTER — Other Ambulatory Visit: Payer: Self-pay | Admitting: Nurse Practitioner

## 2023-02-08 NOTE — Telephone Encounter (Signed)
Medication Refill - Medication: spironolactone (ALDACTONE) 25 MG tablet   Has the patient contacted their pharmacy? Yes.    (Agent: If yes, when and what did the pharmacy advise?) Contact provider   Preferred Pharmacy (with phone number or street name): [place], [place] - [employer]  Has the patient been seen for an appointment in the last year OR does the patient have an upcoming appointment? Yes.    Agent: Please be advised that RX refills may take up to 3 business days. We ask that you follow-up with your pharmacy.   Patient says she reached out to the Pharmacy in regards to her medication. Pharmacy said they requested the refill but haven't heard back in regards to it. Patient is completely out of medication.

## 2023-02-08 NOTE — Addendum Note (Signed)
Addended by: Matilde Sprang on: 02/08/2023 12:56 PM   Modules accepted: Orders

## 2023-02-08 NOTE — Telephone Encounter (Signed)
Requested medication (s) are due for refill today: yes  Requested medication (s) are on the active medication list: yes  Last refill:  12/15/22  Future visit scheduled: yes  Notes to clinic:  Unable to refill per protocol, courtesy refill already given, routing for provider approval.      Requested Prescriptions  Pending Prescriptions Disp Refills   spironolactone (ALDACTONE) 25 MG tablet 30 tablet 0    Sig: Take 0.5 tablets (12.5 mg total) by mouth daily.     Cardiovascular: Diuretics - Aldosterone Antagonist Passed - 02/08/2023 12:56 PM      Passed - Cr in normal range and within 180 days    Creatinine, Ser  Date Value Ref Range Status  01/15/2023 0.96 0.57 - 1.00 mg/dL Final         Passed - K in normal range and within 180 days    Potassium  Date Value Ref Range Status  01/15/2023 4.2 3.5 - 5.2 mmol/L Final         Passed - Na in normal range and within 180 days    Sodium  Date Value Ref Range Status  01/15/2023 141 134 - 144 mmol/L Final         Passed - eGFR is 30 or above and within 180 days    GFR calc Af Amer  Date Value Ref Range Status  05/09/2020 >60 >60 mL/min Final   GFR, Estimated  Date Value Ref Range Status  12/12/2022 >60 >60 mL/min Final    Comment:    (NOTE) Calculated using the CKD-EPI Creatinine Equation (2021)    eGFR  Date Value Ref Range Status  01/15/2023 57 (L) >59 mL/min/1.73 Final         Passed - Last BP in normal range    BP Readings from Last 1 Encounters:  01/15/23 109/66         Passed - Valid encounter within last 6 months    Recent Outpatient Visits           3 weeks ago Hypertension associated with diabetes [facility])   [place] Jon Billings, NP   3 months ago [place], Karen, NP   3 months ago Annual physical exam   [place], Karen, NP   5 months ago Viral upper respiratory tract infection    [place], Karen, NP   7 months ago Hypertension associated with diabetes [facility])   [place] Jon Billings, NP       Future Appointments             In 2 months Jon Billings, NP [place], [facility]

## 2023-02-11 DIAGNOSIS — M6281 Muscle weakness (generalized): Secondary | ICD-10-CM | POA: Diagnosis not present

## 2023-02-11 DIAGNOSIS — J189 Pneumonia, unspecified organism: Secondary | ICD-10-CM | POA: Diagnosis not present

## 2023-02-11 DIAGNOSIS — R2689 Other abnormalities of gait and mobility: Secondary | ICD-10-CM | POA: Diagnosis not present

## 2023-02-11 DIAGNOSIS — A419 Sepsis, unspecified organism: Secondary | ICD-10-CM | POA: Diagnosis not present

## 2023-02-11 DIAGNOSIS — G301 Alzheimer's disease with late onset: Secondary | ICD-10-CM | POA: Diagnosis not present

## 2023-02-11 DIAGNOSIS — I480 Paroxysmal atrial fibrillation: Secondary | ICD-10-CM | POA: Diagnosis not present

## 2023-02-11 DIAGNOSIS — R279 Unspecified lack of coordination: Secondary | ICD-10-CM | POA: Diagnosis not present

## 2023-02-11 MED ORDER — SPIRONOLACTONE 25 MG PO TABS: 12.5000 mg | ORAL_TABLET | Freq: Every day | ORAL | 0 refills | Status: DC

## 2023-02-11 NOTE — Telephone Encounter (Signed)
Requested Prescriptions  Refused Prescriptions Disp Refills   spironolactone (ALDACTONE) 25 MG tablet [Pharmacy Med Name: SPIRONOLACTONE 25 MG TAB] 30 tablet 0    Sig: TAKE 0.5 TABLETS BY MOUTH DAILY     Cardiovascular: Diuretics - Aldosterone Antagonist Passed - 02/08/2023  3:22 PM      Passed - Cr in normal range and within 180 days    Creatinine, Ser  Date Value Ref Range Status  01/15/2023 0.96 0.57 - 1.00 mg/dL Final         Passed - K in normal range and within 180 days    Potassium  Date Value Ref Range Status  01/15/2023 4.2 3.5 - 5.2 mmol/L Final         Passed - Na in normal range and within 180 days    Sodium  Date Value Ref Range Status  01/15/2023 141 134 - 144 mmol/L Final         Passed - eGFR is 30 or above and within 180 days    GFR calc Af Amer  Date Value Ref Range Status  05/09/2020 >60 >60 mL/min Final   GFR, Estimated  Date Value Ref Range Status  12/12/2022 >60 >60 mL/min Final    Comment:    (NOTE) Calculated using the CKD-EPI Creatinine Equation (2021)    eGFR  Date Value Ref Range Status  01/15/2023 57 (L) >59 mL/min/1.73 Final         Passed - Last BP in normal range    BP Readings from Last 1 Encounters:  01/15/23 109/66         Passed - Valid encounter within last 6 months    Recent Outpatient Visits           3 weeks ago Hypertension associated with diabetes [facility])   [place] Jon Billings, NP   3 months ago [place], Karen, NP   3 months ago Annual physical exam   [place], Karen, NP   5 months ago Viral upper respiratory tract infection   [place], Karen, NP   7 months ago Hypertension associated with diabetes [facility])   [place] Jon Billings, NP       Future Appointments             In 2 months Jon Billings, NP [place], [facility]

## 2023-02-13 DIAGNOSIS — I480 Paroxysmal atrial fibrillation: Secondary | ICD-10-CM | POA: Diagnosis not present

## 2023-02-13 DIAGNOSIS — R279 Unspecified lack of coordination: Secondary | ICD-10-CM | POA: Diagnosis not present

## 2023-02-13 DIAGNOSIS — A419 Sepsis, unspecified organism: Secondary | ICD-10-CM | POA: Diagnosis not present

## 2023-02-13 DIAGNOSIS — R488 Other symbolic dysfunctions: Secondary | ICD-10-CM | POA: Diagnosis not present

## 2023-02-13 DIAGNOSIS — G3184 Mild cognitive impairment, so stated: Secondary | ICD-10-CM | POA: Diagnosis not present

## 2023-02-13 DIAGNOSIS — G301 Alzheimer's disease with late onset: Secondary | ICD-10-CM | POA: Diagnosis not present

## 2023-02-13 DIAGNOSIS — M6281 Muscle weakness (generalized): Secondary | ICD-10-CM | POA: Diagnosis not present

## 2023-02-13 DIAGNOSIS — J189 Pneumonia, unspecified organism: Secondary | ICD-10-CM | POA: Diagnosis not present

## 2023-02-13 DIAGNOSIS — R2689 Other abnormalities of gait and mobility: Secondary | ICD-10-CM | POA: Diagnosis not present

## 2023-02-14 DIAGNOSIS — R488 Other symbolic dysfunctions: Secondary | ICD-10-CM | POA: Diagnosis not present

## 2023-02-14 DIAGNOSIS — G3184 Mild cognitive impairment, so stated: Secondary | ICD-10-CM | POA: Diagnosis not present

## 2023-02-18 DIAGNOSIS — R279 Unspecified lack of coordination: Secondary | ICD-10-CM | POA: Diagnosis not present

## 2023-02-18 DIAGNOSIS — A419 Sepsis, unspecified organism: Secondary | ICD-10-CM | POA: Diagnosis not present

## 2023-02-18 DIAGNOSIS — J189 Pneumonia, unspecified organism: Secondary | ICD-10-CM | POA: Diagnosis not present

## 2023-02-18 DIAGNOSIS — R2689 Other abnormalities of gait and mobility: Secondary | ICD-10-CM | POA: Diagnosis not present

## 2023-02-18 DIAGNOSIS — G301 Alzheimer's disease with late onset: Secondary | ICD-10-CM | POA: Diagnosis not present

## 2023-02-18 DIAGNOSIS — M6281 Muscle weakness (generalized): Secondary | ICD-10-CM | POA: Diagnosis not present

## 2023-02-18 DIAGNOSIS — I480 Paroxysmal atrial fibrillation: Secondary | ICD-10-CM | POA: Diagnosis not present

## 2023-02-21 DIAGNOSIS — G3184 Mild cognitive impairment, so stated: Secondary | ICD-10-CM | POA: Diagnosis not present

## 2023-02-21 DIAGNOSIS — R488 Other symbolic dysfunctions: Secondary | ICD-10-CM | POA: Diagnosis not present

## 2023-02-25 ENCOUNTER — Encounter: Payer: Self-pay | Admitting: Family

## 2023-02-25 ENCOUNTER — Ambulatory Visit: Payer: Medicare PPO | Attending: Family | Admitting: Family

## 2023-02-25 VITALS — BP 108/82 | HR 100 | Wt 174.4 lb

## 2023-02-25 DIAGNOSIS — I482 Chronic atrial fibrillation, unspecified: Secondary | ICD-10-CM

## 2023-02-25 DIAGNOSIS — Z7902 Long term (current) use of antithrombotics/antiplatelets: Secondary | ICD-10-CM | POA: Insufficient documentation

## 2023-02-25 DIAGNOSIS — E782 Mixed hyperlipidemia: Secondary | ICD-10-CM

## 2023-02-25 DIAGNOSIS — Z7901 Long term (current) use of anticoagulants: Secondary | ICD-10-CM | POA: Insufficient documentation

## 2023-02-25 DIAGNOSIS — E1151 Type 2 diabetes mellitus with diabetic peripheral angiopathy without gangrene: Secondary | ICD-10-CM | POA: Insufficient documentation

## 2023-02-25 DIAGNOSIS — G301 Alzheimer's disease with late onset: Secondary | ICD-10-CM | POA: Diagnosis not present

## 2023-02-25 DIAGNOSIS — I2510 Atherosclerotic heart disease of native coronary artery without angina pectoris: Secondary | ICD-10-CM | POA: Diagnosis not present

## 2023-02-25 DIAGNOSIS — Z79899 Other long term (current) drug therapy: Secondary | ICD-10-CM | POA: Insufficient documentation

## 2023-02-25 DIAGNOSIS — I5032 Chronic diastolic (congestive) heart failure: Secondary | ICD-10-CM

## 2023-02-25 DIAGNOSIS — I110 Hypertensive heart disease with heart failure: Secondary | ICD-10-CM | POA: Insufficient documentation

## 2023-02-25 DIAGNOSIS — K219 Gastro-esophageal reflux disease without esophagitis: Secondary | ICD-10-CM | POA: Insufficient documentation

## 2023-02-25 DIAGNOSIS — I272 Pulmonary hypertension, unspecified: Secondary | ICD-10-CM | POA: Diagnosis not present

## 2023-02-25 DIAGNOSIS — F02A18 Dementia in other diseases classified elsewhere, mild, with other behavioral disturbance: Secondary | ICD-10-CM | POA: Diagnosis not present

## 2023-02-25 DIAGNOSIS — I4891 Unspecified atrial fibrillation: Secondary | ICD-10-CM | POA: Diagnosis not present

## 2023-02-25 DIAGNOSIS — I10 Essential (primary) hypertension: Secondary | ICD-10-CM | POA: Diagnosis not present

## 2023-02-25 DIAGNOSIS — E785 Hyperlipidemia, unspecified: Secondary | ICD-10-CM | POA: Diagnosis not present

## 2023-02-25 DIAGNOSIS — Z85828 Personal history of other malignant neoplasm of skin: Secondary | ICD-10-CM | POA: Insufficient documentation

## 2023-02-25 DIAGNOSIS — G4730 Sleep apnea, unspecified: Secondary | ICD-10-CM | POA: Insufficient documentation

## 2023-02-25 DIAGNOSIS — F02A3 Dementia in other diseases classified elsewhere, mild, with mood disturbance: Secondary | ICD-10-CM | POA: Diagnosis not present

## 2023-02-25 DIAGNOSIS — E1169 Type 2 diabetes mellitus with other specified complication: Secondary | ICD-10-CM | POA: Diagnosis not present

## 2023-02-25 NOTE — Progress Notes (Signed)
Patient ID: Alison Richardson, female    DOB: 07/13/1933, 89 y.o.   MRN: [id number]  HPI  Ms Obara is a 89 y/o female with a history of skin cancer, DM, hyperlipidemia, HTN, thyroid disease, GERD, cerebral aneurysm, atrial flutter, mild pulmonary HTN, PVD, sleep apnea, late onset alzheimer's disease and chronic heart failure.   Echo 12/11/22 showed an EF of 50-55% along with mild MR. Echo report from 10/31/21 reviewed and showed an EF of 45-50% along with mild MR. Echo report from 10/07/20 reviewed and showed an EF of 60-65% along with mild MR but without regional wall motion abnormalities.   LHC done 10/12/21:   Ost RCA to Prox RCA lesion is 99% stenosed.   Dist RCA lesion is 65% stenosed.   RPDA lesion is 60% stenosed.   1st Diag lesion is 99% stenosed.   Ost Cx to Prox Cx lesion is 25% stenosed.   Ost LAD to Prox LAD lesion is 25% stenosed.   There is mild left ventricular systolic dysfunction.   LV end diastolic pressure is mildly elevated.   The left ventricular ejection fraction is 35-45% by visual estimate.  Admitted 12/09/22 due to pneumonia.   She presents today for a follow-up visit with a chief complaint of moderate fatigue with minimal exertion. Describes this as chronic in nature. Has associated cough, SOB (improving), chest congestion, light-headedness and gradual weight gain along with this. Denies any difficulty sleeping, abdominal distention, palpitations, pedal edema or chest pain.   Gets her medications pill-packed so does not know what she's taking  Brought her BP/ weight chart in for review.   Past Medical History:  Diagnosis Date   Arrhythmia    Atrial flutter ([place]) 01/2018   new onset    Basal cell carcinoma of back    Basal cell carcinoma of lip    Cerebral aneurysm    followed by [facility]   CHF (congestive heart failure) (HCC)    DDD (degenerative disc disease), lumbar    superior plate depression, L3 08/18/2014   Diabetes mellitus type II, controlled ([place])     Diverticulosis    Dysrhythmia    Paroxysmal Supraventricular Tachycardia   Dysthymia    depression   GERD (gastroesophageal reflux disease)    History of meniscal tear    Hyperlipidemia    Hypertension    Hypothyroidism    Late onset Alzheimer's disease with behavioral disturbance ([place])    Mild pulmonary hypertension ([place])    Overactive bladder    Peripheral vascular disease ([place])    Seasonal allergic rhinitis    Sleep apnea    Past Surgical History:  Procedure Laterality Date   APPENDECTOMY  1946   BASAL CELL CARCINOMA EXCISION     2006 and 2009 removed from back and lip   CARDIOVASCULAR STRESS TEST  2015   nuclear cardiac stress test negative for ischemia - Dr. Satira Sark   CATARACT EXTRACTION, BILATERAL  2006   COLONOSCOPY  2014   COLONOSCOPY N/A 08/19/2020   Procedure: COLONOSCOPY;  Surgeon: Lesly Rubenstein, MD;  Location: [facility] ENDOSCOPY;  Service: Endoscopy;  Laterality: N/A;   ECTOPIC PREGNANCY SURGERY  1957   ESOPHAGOGASTRODUODENOSCOPY N/A 08/19/2020   Procedure: ESOPHAGOGASTRODUODENOSCOPY (EGD);  Surgeon: Lesly Rubenstein, MD;  Location: [facility] ENDOSCOPY;  Service: Endoscopy;  Laterality: N/A;   INJECTION KNEE Left 05/2015   LEFT HEART CATH AND CORONARY ANGIOGRAPHY N/A 10/12/2021   Procedure: LEFT HEART CATH AND CORONARY ANGIOGRAPHY;  Surgeon: Corey Skains, MD;  Location:  [place] [facility] LAB;  Service: Cardiovascular;  Laterality: N/A;   REPLACEMENT TOTAL KNEE Left 09/06/2016   Dr. Barnet Pall   TONSILLECTOMY AND ADENOIDECTOMY  1953   TOTAL ABDOMINAL HYSTERECTOMY  1986   DU B   Family History  Problem Relation Age of Onset   Hypertension Mother    Heart disease Father    CAD Father    Heart disease Sister    Diabetes Sister    Diabetes Paternal Uncle    Sleep apnea Son    Social History   Tobacco Use   Smoking status: Never   Smokeless tobacco: Never  Substance Use Topics   Alcohol use: Yes    Alcohol/week: 3.0 standard drinks of alcohol    Types:  3 Shots of liquor per week    Comment: 3 x week    Allergies  Allergen Reactions   Pantoprazole Nausea And Vomiting   Metformin And Related Diarrhea   Prior to Admission medications   Medication Sig Start Date End Date Taking? Authorizing Provider  amiodarone (PACERONE) 200 MG tablet Take 200 mg by mouth daily.  01/02/19  Yes [provider]  clopidogrel (PLAVIX) 75 MG tablet Take 75 mg by mouth daily.   Yes [provider]  dapagliflozin propanediol (FARXIGA) 10 MG TABS tablet Take 1 tablet (10 mg total) by mouth daily before breakfast. 12/12/22  Yes Lorella Nimrod, MD  desloratadine (CLARINEX) 5 MG tablet TAKE 1 TABLET BY MOUTH ONCE DAILY 01/08/23  Yes Dgayli, Berdine Addison, MD  diltiazem (CARDIZEM CD) 240 MG 24 hr capsule Take 240 mg by mouth daily. 11/23/22  Yes [provider]  donepezil (ARICEPT) 10 MG tablet TAKE ONE TABLET (10 MG) BY MOUTH AT BEDTIME 09/19/22  Yes Jon Billings, NP  ELIQUIS 5 MG TABS tablet Take 5 mg by mouth 2 (two) times daily.  05/06/20  Yes [provider]  famotidine (PEPCID) 20 MG tablet TAKE 1 TABLET BY MOUTH ONCE DAILY 10/24/22  Yes Jon Billings, NP  fluticasone (FLONASE) 50 MCG/ACT nasal spray Place 1 spray into both nostrils daily. 11/28/22 11/28/23 Yes Dgayli, Berdine Addison, MD  gabapentin (NEURONTIN) 100 MG capsule Take 1 capsule (100 mg total) by mouth daily. 01/09/23  Yes Jon Billings, NP  glipiZIDE (GLUCOTROL XL) 5 MG 24 hr tablet Take 1 tablet (5 mg total) by mouth daily. 12/12/22  Yes Lorella Nimrod, MD  isosorbide mononitrate (IMDUR) 30 MG 24 hr tablet Take 30 mg by mouth daily.   Yes [provider]  levothyroxine (SYNTHROID) 88 MCG tablet Take 1 tablet (88 mcg total) by mouth daily. 07/17/22  Yes Jon Billings, NP  metoprolol succinate (TOPROL-XL) 100 MG 24 hr tablet TAKE ONE TABLET (100 MG) BY MOUTH EVERY DAY (TAKE WITH OR IMMEDIATELY AFTER A MEAL) 10/24/22  Yes Jon Billings, NP  nitroGLYCERIN (NITROSTAT)  0.4 MG SL tablet Place 0.4 mg under the tongue every 5 (five) minutes as needed for chest pain.   Yes [provider]  pravastatin (PRAVACHOL) 20 MG tablet Take 20 mg by mouth at bedtime. 07/05/20  Yes [provider]  spironolactone (ALDACTONE) 25 MG tablet Take 0.5 tablets (12.5 mg total) by mouth daily. 02/11/23  Yes Jon Billings, NP  torsemide (DEMADEX) 20 MG tablet Take 1 tablet (20 mg total) by mouth daily. 11/03/21  Yes Richarda Osmond, MD  venlafaxine XR (EFFEXOR-XR) 75 MG 24 hr capsule TAKE 1 CAPSULE BY MOUTH ONCE DAILY. 09/19/22  Yes Jon Billings, NP   Review of Systems  Constitutional:  Positive for fatigue (easily). Negative for appetite change.  HENT:  Positive for congestion. Negative for postnasal drip and sore throat.   Eyes: Negative.   Respiratory:  Positive for cough (dry cough) and shortness of breath (improving). Negative for chest tightness.   Cardiovascular:  Negative for chest pain, palpitations and leg swelling.  Gastrointestinal:  Negative for abdominal distention and abdominal pain.  Endocrine: Negative.   Genitourinary: Negative.   Musculoskeletal:  Positive for arthralgias (hip pain). Negative for back pain.  Skin: Negative.   Allergic/Immunologic: Negative.   Neurological:  Positive for light-headedness (when changing positions quickly). Negative for dizziness and headaches.  Hematological:  Negative for adenopathy. Does not bruise/bleed easily.  Psychiatric/Behavioral:  Negative for dysphoric mood and sleep disturbance (sleeping on 1 pillow). The patient is not nervous/anxious.    Vitals:   02/25/23 1329  BP: 108/82  Pulse: 100  SpO2: 96%  Weight: 174 lb 6.4 oz (79.1 kg)   Wt Readings from Last 3 Encounters:  02/25/23 174 lb 6.4 oz (79.1 kg)  01/15/23 170 lb 6.4 oz (77.3 kg)  12/27/22 169 lb (76.7 kg)   Lab Results  Component Value Date   CREATININE 0.96 01/15/2023   CREATININE 0.84 12/12/2022   CREATININE 0.89  12/11/2022   Physical Exam Vitals and nursing note reviewed.  Constitutional:      Appearance: She is well-developed.  HENT:     Head: Normocephalic and atraumatic.     Right Ear: Decreased hearing noted.     Left Ear: Decreased hearing noted.  Cardiovascular:     Rate and Rhythm: Normal rate. Rhythm irregular.  Pulmonary:     Effort: Pulmonary effort is normal. No respiratory distress.     Breath sounds: No wheezing or rales.  Abdominal:     Palpations: Abdomen is soft.     Tenderness: There is no abdominal tenderness.  Musculoskeletal:     Right lower leg: No tenderness. No edema.     Left lower leg: No tenderness. No edema.  Skin:    General: Skin is warm and dry.  Neurological:     General: No focal deficit present.     Mental Status: She is alert and oriented to person, place, and time.  Psychiatric:        Mood and Affect: Mood normal.        Behavior: Behavior normal.    Assessment & Plan:  1: Chronic heart failure with preserved ejection fraction without LVH/LAE - - NYHA class III - euvolemic today - weighing daily & home weight chart ranges from 169-172 pounds; reminded to call for an overnight weight gain of >2 pounds or a weekly weight gain of >5 pounds - weight up 5 pounds from last visit here 2 months ago - not adding additional salt to her food but does eat at the cafeteria for her meals at [facility] - advised to drink between 50-64 ounces of fluids daily - echo 12/11/22 showed an EF of 50-55% along with mild MR.  - LHC done 10/12/21:   Ost RCA to Prox RCA lesion is 99% stenosed.   Dist RCA lesion is 65% stenosed.   RPDA lesion is 60% stenosed.   1st Diag lesion is 99% stenosed.   Ost Cx to Prox Cx lesion is 25% stenosed.   Ost LAD to Prox LAD lesion is 25% stenosed.   There is mild left ventricular systolic dysfunction.   LV end diastolic pressure is mildly elevated.   The left ventricular ejection  fraction is 35-45% by visual estimate. - metoprolol  succinate '100mg'$  daily - farxiga '10mg'$  daily - isosorbide MN '30mg'$  daily - spironolactone 12.'5mg'$  daily - torsemide '20mg'$  daily - saw pulmonology (Dgayli) 11/28/22 - BNP 12/11/22 was 521.5  2: HTN- - BP 108/82; home BP ranges from 133-170/81- 107; unsure of accuracy of some of these readings - saw PCP Mathis Dad) 1/23/243 - BMP 01/16/20 reviewed and showed sodium 141, potassium 4.2, creatinine 0.96 and GFR 57  3: DM- - A1c 01/15/23 was 7.8% - glipizide '5mg'$  daily - home glucose was 143  4: Atrial fibrillation- - saw cardiology (Paraschos) 12/26/22 - amiodarone '300mg'$  daily - clopidogrel '75mg'$  daily - diltiazem '240mg'$  daily - apixaban '5mg'$  BID   Patient did not bring her medications nor a list. Each medication was verbally reviewed with the patient and she was encouraged to bring the bottles to every visit to confirm accuracy of list.  Return in 4 months, sooner if needed.

## 2023-02-26 DIAGNOSIS — R488 Other symbolic dysfunctions: Secondary | ICD-10-CM | POA: Diagnosis not present

## 2023-02-26 DIAGNOSIS — G3184 Mild cognitive impairment, so stated: Secondary | ICD-10-CM | POA: Diagnosis not present

## 2023-02-27 ENCOUNTER — Telehealth: Payer: Self-pay | Admitting: Nurse Practitioner

## 2023-02-27 NOTE — Telephone Encounter (Signed)
Copied from [place] [phone]. Topic: Medicare AWV >> Feb 27, 2023 11:04 AM Devoria Glassing wrote: Reason for CRM: Called patient to schedule Medicare Annual Wellness Visit (AWV). Left message for patient to call back and schedule Medicare Annual Wellness Visit (AWV).  Last date of AWV: 02/20/22  Please schedule an appointment at any time with Kirke Shaggy, NHA  .  If any questions, please contact me.  Thank you ,  Sherol Dade; [place] Direct Dial: [phone]

## 2023-03-05 ENCOUNTER — Other Ambulatory Visit: Payer: Self-pay | Admitting: Family

## 2023-03-05 ENCOUNTER — Other Ambulatory Visit: Payer: Self-pay | Admitting: Nurse Practitioner

## 2023-03-05 DIAGNOSIS — M545 Low back pain, unspecified: Secondary | ICD-10-CM | POA: Diagnosis not present

## 2023-03-06 NOTE — Telephone Encounter (Signed)
Requested medication (s) are due for refill today: yes  Requested medication (s) are on the active medication list: yes  Last refill:  12/12/22  Future visit scheduled: yes  Notes to clinic:  Unable to refill per protocol, last refill by another provider.  Medication was refilled at the ED, routing for approval.     Requested Prescriptions  Pending Prescriptions Disp Refills   glipiZIDE (GLUCOTROL XL) 5 MG 24 hr tablet [Pharmacy Med Name: GLIPIZIDE ER 5 MG TAB] 30 tablet 2    Sig: TAKE 1 TABLET BY MOUTH DAILY     Endocrinology:  Diabetes - Sulfonylureas Passed - 03/05/2023  3:14 PM      Passed - HBA1C is between 0 and 7.9 and within 180 days    Hgb A1c MFr Bld  Date Value Ref Range Status  01/15/2023 7.8 (H) 4.8 - 5.6 % Final    Comment:             Prediabetes: 5.7 - 6.4          Diabetes: >6.4          Glycemic control for adults with diabetes: <7.0          Passed - Cr in normal range and within 360 days    Creatinine, Ser  Date Value Ref Range Status  01/15/2023 0.96 0.57 - 1.00 mg/dL Final         Passed - Valid encounter within last 6 months    Recent Outpatient Visits           1 month ago Hypertension associated with diabetes [facility])   [place] Jon Billings, NP   4 months ago [place], Karen, NP   4 months ago Annual physical exam   [place] Jon Billings, NP   5 months ago Viral upper respiratory tract infection   [place], Karen, NP   7 months ago Hypertension associated with diabetes [facility])   [place] Jon Billings, NP       Future Appointments             In 1 month Jon Billings, NP [place], [facility]

## 2023-03-07 DIAGNOSIS — G3184 Mild cognitive impairment, so stated: Secondary | ICD-10-CM | POA: Diagnosis not present

## 2023-03-07 DIAGNOSIS — R488 Other symbolic dysfunctions: Secondary | ICD-10-CM | POA: Diagnosis not present

## 2023-03-12 ENCOUNTER — Telehealth: Payer: Self-pay | Admitting: Nurse Practitioner

## 2023-03-12 DIAGNOSIS — G3184 Mild cognitive impairment, so stated: Secondary | ICD-10-CM | POA: Diagnosis not present

## 2023-03-12 DIAGNOSIS — R488 Other symbolic dysfunctions: Secondary | ICD-10-CM | POA: Diagnosis not present

## 2023-03-12 NOTE — Telephone Encounter (Signed)
Appointment was made

## 2023-03-12 NOTE — Telephone Encounter (Signed)
This can be done here in the office. Please call and schedule an office visit.

## 2023-03-12 NOTE — Telephone Encounter (Signed)
Copied from [place] [id number]. Topic: Appointment Scheduling - Scheduling Inquiry for Clinic >> Mar 12, 2023 11:52 AM Sabas Sous wrote: Reason for CRM: Pt wants to know if Jon Billings can see her for wax removal from her ears. Wants to know if she is going to refer her elsewhere or if PCP can do it herself? Please advise. Tried calling office

## 2023-03-13 ENCOUNTER — Ambulatory Visit: Payer: Medicare PPO | Admitting: Nurse Practitioner

## 2023-03-13 ENCOUNTER — Encounter: Payer: Self-pay | Admitting: Nurse Practitioner

## 2023-03-13 VITALS — BP 114/70 | HR 105 | Temp 98.3°F | Wt 174.0 lb

## 2023-03-13 DIAGNOSIS — M5416 Radiculopathy, lumbar region: Secondary | ICD-10-CM | POA: Diagnosis not present

## 2023-03-13 DIAGNOSIS — E1142 Type 2 diabetes mellitus with diabetic polyneuropathy: Secondary | ICD-10-CM

## 2023-03-13 DIAGNOSIS — H6123 Impacted cerumen, bilateral: Secondary | ICD-10-CM

## 2023-03-13 DIAGNOSIS — E119 Type 2 diabetes mellitus without complications: Secondary | ICD-10-CM | POA: Diagnosis not present

## 2023-03-13 NOTE — Progress Notes (Unsigned)
   BP 114/70   Pulse (!) 105   Temp 98.3 F (36.8 C) (Oral)   Wt 174 lb (78.9 kg)   SpO2 96%   BMI 28.08 kg/m    Subjective:    Patient ID: Alison Richardson, female    DOB: 1933/01/28, 89 y.o.   MRN: [id number]  HPI: Alison Richardson is a 89 y.o. female  Chief Complaint  Patient presents with   Cerumen Impaction   Patient states she feels like she is under water and having a difficult time hearing.  Feels like she needs to have her ears cleaned out.   Patient states she went to get a back injection and they checked her sugar and it was >250.  This is unusual for her.  She is wondering if something needs to be changed with her medications.  Sugars typically run <200.   Relevant past medical, surgical, family and social history reviewed and updated as indicated. Interim medical history since our last visit reviewed. Allergies and medications reviewed and updated.  Review of Systems  Per HPI unless specifically indicated above     Objective:    BP 114/70   Pulse (!) 105   Temp 98.3 F (36.8 C) (Oral)   Wt 174 lb (78.9 kg)   SpO2 96%   BMI 28.08 kg/m   Wt Readings from Last 3 Encounters:  03/13/23 174 lb (78.9 kg)  02/25/23 174 lb 6.4 oz (79.1 kg)  01/15/23 170 lb 6.4 oz (77.3 kg)    Physical Exam  Results for orders placed or performed in visit on 01/15/23  Comp Met (CMET)  Result Value Ref Range   Glucose 150 (H) 70 - 99 mg/dL   BUN 18 8 - 27 mg/dL   Creatinine, Ser 0.96 0.57 - 1.00 mg/dL   eGFR 57 (L) >59 mL/min/1.73   BUN/Creatinine Ratio 19 12 - 28   Sodium 141 134 - 144 mmol/L   Potassium 4.2 3.5 - 5.2 mmol/L   Chloride 103 96 - 106 mmol/L   CO2 20 20 - 29 mmol/L   Calcium 9.6 8.7 - 10.3 mg/dL   Total Protein 7.2 6.0 - 8.5 g/dL   Albumin 4.8 (H) 3.7 - 4.7 g/dL   Globulin, Total 2.4 1.5 - 4.5 g/dL   Albumin/Globulin Ratio 2.0 1.2 - 2.2   Bilirubin Total 0.2 0.0 - 1.2 mg/dL   Alkaline Phosphatase 131 (H) 44 - 121 IU/L   AST 22 0 - 40 IU/L   ALT 21 0 -  32 IU/L  Lipid Profile  Result Value Ref Range   Cholesterol, Total 189 100 - 199 mg/dL   Triglycerides 273 (H) 0 - 149 mg/dL   HDL 50 >39 mg/dL   VLDL Cholesterol Cal 46 (H) 5 - 40 mg/dL   LDL Chol Calc (NIH) 93 0 - 99 mg/dL   Chol/HDL Ratio 3.8 0.0 - 4.4 ratio  HgB A1c  Result Value Ref Range   Hgb A1c MFr Bld 7.8 (H) 4.8 - 5.6 %   Est. average glucose Bld gHb Est-mCnc 177 mg/dL      Assessment & Plan:   Problem List Items Addressed This Visit   None    Follow up plan: No follow-ups on file.

## 2023-03-14 ENCOUNTER — Telehealth: Payer: Self-pay | Admitting: Nurse Practitioner

## 2023-03-14 DIAGNOSIS — G3184 Mild cognitive impairment, so stated: Secondary | ICD-10-CM | POA: Diagnosis not present

## 2023-03-14 DIAGNOSIS — R488 Other symbolic dysfunctions: Secondary | ICD-10-CM | POA: Diagnosis not present

## 2023-03-14 LAB — HEMOGLOBIN A1C
Est. average glucose Bld gHb Est-mCnc: 169 mg/dL
Hgb A1c MFr Bld: 7.5 % — ABNORMAL HIGH (ref 4.8–5.6)

## 2023-03-14 NOTE — Assessment & Plan Note (Signed)
Chronic.  Sugars are typically <200.  Pain doctor was concerned because she was >250 at her appointment.  A1c checked today, if great than 8% will add Rybelsus.  Will make recommendations based on lab results.

## 2023-03-14 NOTE — Progress Notes (Signed)
Please let patient know her A1c improved to 7.5%.  I am not concerned at this time and don't think she needs to add additional medication.

## 2023-03-14 NOTE — Telephone Encounter (Signed)
Scheduled appointment for April 16,2024 @ 11:00 am with nurse health advisor.

## 2023-03-14 NOTE — Telephone Encounter (Signed)
Can we call and get her medicare wellness visit scheduled with the nurse health advisor?

## 2023-03-19 DIAGNOSIS — G3184 Mild cognitive impairment, so stated: Secondary | ICD-10-CM | POA: Diagnosis not present

## 2023-03-19 DIAGNOSIS — R488 Other symbolic dysfunctions: Secondary | ICD-10-CM | POA: Diagnosis not present

## 2023-03-25 DIAGNOSIS — I480 Paroxysmal atrial fibrillation: Secondary | ICD-10-CM | POA: Diagnosis not present

## 2023-03-25 DIAGNOSIS — G301 Alzheimer's disease with late onset: Secondary | ICD-10-CM | POA: Diagnosis not present

## 2023-03-25 DIAGNOSIS — R2689 Other abnormalities of gait and mobility: Secondary | ICD-10-CM | POA: Diagnosis not present

## 2023-03-25 DIAGNOSIS — A419 Sepsis, unspecified organism: Secondary | ICD-10-CM | POA: Diagnosis not present

## 2023-03-25 DIAGNOSIS — J189 Pneumonia, unspecified organism: Secondary | ICD-10-CM | POA: Diagnosis not present

## 2023-03-25 DIAGNOSIS — M6281 Muscle weakness (generalized): Secondary | ICD-10-CM | POA: Diagnosis not present

## 2023-03-25 DIAGNOSIS — R279 Unspecified lack of coordination: Secondary | ICD-10-CM | POA: Diagnosis not present

## 2023-03-26 DIAGNOSIS — I471 Supraventricular tachycardia, unspecified: Secondary | ICD-10-CM | POA: Diagnosis not present

## 2023-03-26 DIAGNOSIS — E1140 Type 2 diabetes mellitus with diabetic neuropathy, unspecified: Secondary | ICD-10-CM | POA: Diagnosis not present

## 2023-03-26 DIAGNOSIS — G301 Alzheimer's disease with late onset: Secondary | ICD-10-CM | POA: Diagnosis not present

## 2023-03-26 DIAGNOSIS — I671 Cerebral aneurysm, nonruptured: Secondary | ICD-10-CM | POA: Diagnosis not present

## 2023-03-26 DIAGNOSIS — G609 Hereditary and idiopathic neuropathy, unspecified: Secondary | ICD-10-CM | POA: Diagnosis not present

## 2023-03-26 DIAGNOSIS — I5032 Chronic diastolic (congestive) heart failure: Secondary | ICD-10-CM | POA: Diagnosis not present

## 2023-03-26 DIAGNOSIS — I2510 Atherosclerotic heart disease of native coronary artery without angina pectoris: Secondary | ICD-10-CM | POA: Diagnosis not present

## 2023-03-26 DIAGNOSIS — R488 Other symbolic dysfunctions: Secondary | ICD-10-CM | POA: Diagnosis not present

## 2023-03-26 DIAGNOSIS — G3184 Mild cognitive impairment, so stated: Secondary | ICD-10-CM | POA: Diagnosis not present

## 2023-03-26 DIAGNOSIS — I4891 Unspecified atrial fibrillation: Secondary | ICD-10-CM | POA: Diagnosis not present

## 2023-03-26 DIAGNOSIS — I2781 Cor pulmonale (chronic): Secondary | ICD-10-CM | POA: Diagnosis not present

## 2023-03-27 DIAGNOSIS — R2689 Other abnormalities of gait and mobility: Secondary | ICD-10-CM | POA: Diagnosis not present

## 2023-03-27 DIAGNOSIS — A419 Sepsis, unspecified organism: Secondary | ICD-10-CM | POA: Diagnosis not present

## 2023-03-27 DIAGNOSIS — G301 Alzheimer's disease with late onset: Secondary | ICD-10-CM | POA: Diagnosis not present

## 2023-03-27 DIAGNOSIS — M6281 Muscle weakness (generalized): Secondary | ICD-10-CM | POA: Diagnosis not present

## 2023-03-27 DIAGNOSIS — I480 Paroxysmal atrial fibrillation: Secondary | ICD-10-CM | POA: Diagnosis not present

## 2023-03-27 DIAGNOSIS — R279 Unspecified lack of coordination: Secondary | ICD-10-CM | POA: Diagnosis not present

## 2023-03-27 DIAGNOSIS — J189 Pneumonia, unspecified organism: Secondary | ICD-10-CM | POA: Diagnosis not present

## 2023-04-01 ENCOUNTER — Other Ambulatory Visit: Payer: Self-pay | Admitting: Nurse Practitioner

## 2023-04-01 DIAGNOSIS — A419 Sepsis, unspecified organism: Secondary | ICD-10-CM | POA: Diagnosis not present

## 2023-04-01 DIAGNOSIS — M6281 Muscle weakness (generalized): Secondary | ICD-10-CM | POA: Diagnosis not present

## 2023-04-01 DIAGNOSIS — J189 Pneumonia, unspecified organism: Secondary | ICD-10-CM | POA: Diagnosis not present

## 2023-04-01 DIAGNOSIS — R279 Unspecified lack of coordination: Secondary | ICD-10-CM | POA: Diagnosis not present

## 2023-04-01 DIAGNOSIS — R2689 Other abnormalities of gait and mobility: Secondary | ICD-10-CM | POA: Diagnosis not present

## 2023-04-01 DIAGNOSIS — G301 Alzheimer's disease with late onset: Secondary | ICD-10-CM | POA: Diagnosis not present

## 2023-04-01 DIAGNOSIS — I480 Paroxysmal atrial fibrillation: Secondary | ICD-10-CM | POA: Diagnosis not present

## 2023-04-02 DIAGNOSIS — M25511 Pain in right shoulder: Secondary | ICD-10-CM | POA: Diagnosis not present

## 2023-04-02 DIAGNOSIS — M546 Pain in thoracic spine: Secondary | ICD-10-CM | POA: Diagnosis not present

## 2023-04-02 DIAGNOSIS — M5416 Radiculopathy, lumbar region: Secondary | ICD-10-CM | POA: Diagnosis not present

## 2023-04-02 NOTE — Telephone Encounter (Signed)
Requested Prescriptions  Pending Prescriptions Disp Refills   famotidine (PEPCID) 20 MG tablet [Pharmacy Med Name: FAMOTIDINE 20 MG TAB] 90 tablet 1    Sig: TAKE 1 TABLET BY MOUTH ONCE DAILY     Gastroenterology:  H2 Antagonists Passed - 04/01/2023 11:52 AM      Passed - Valid encounter within last 12 months    Recent Outpatient Visits           2 weeks ago Type 2 diabetes mellitus with peripheral neuropathy (HCC)   [place] [facility] Larae Grooms, NP   2 months ago Hypertension associated with diabetes [facility])   [place] [facility] Larae Grooms, NP   4 months ago COVID-19   [place] [facility] Larae Grooms, NP   5 months ago Annual physical exam   [place] [facility] Larae Grooms, NP   6 months ago Viral upper respiratory tract infection   [place] [facility] Larae Grooms, NP       Future Appointments             In 2 weeks Larae Grooms, NP [place] [facility], [facility]             metoprolol succinate (TOPROL-XL) 100 MG 24 hr tablet [Pharmacy Med Name: METOPROLOL SUCCINATE ER 100 MG TAB] 90 tablet 0    Sig: TAKE ONE TABLET (100 MG) BY MOUTH EVERY DAY (TAKE WITH OR IMMEDIATELY AFTER A MEAL)     Cardiovascular:  Beta Blockers Passed - 04/01/2023 11:52 AM      Passed - Last BP in normal range    BP Readings from Last 1 Encounters:  03/13/23 114/70         Passed - Last Heart Rate in normal range    Pulse Readings from Last 1 Encounters:  03/13/23 (!) 105         Passed - Valid encounter within last 6 months    Recent Outpatient Visits           2 weeks ago Type 2 diabetes mellitus with peripheral neuropathy (HCC)   [place] [facility] [place], Clydie Braun, NP   2 months ago Hypertension associated with diabetes [facility])   [place] [facility] Larae Grooms, NP   4 months ago COVID-19   [place] [facility] Larae Grooms, NP   5 months ago Annual physical exam   [place] [facility] Larae Grooms, NP   6 months ago Viral upper respiratory tract infection   [place] [facility] Larae Grooms, NP       Future Appointments             In 2 weeks Larae Grooms, NP  [facility], [facility]             gabapentin (NEURONTIN) 100 MG capsule [Pharmacy Med Name: GABAPENTIN 100 MG CAP] 90 capsule 1    Sig: TAKE 1 CAPSULE BY MOUTH DAILY     Neurology: Anticonvulsants - gabapentin Passed - 04/01/2023 11:52 AM      Passed - Cr in normal range and within 360 days    Creatinine, Ser  Date Value Ref Range Status  01/15/2023 0.96 0.57 - 1.00 mg/dL Final         Passed - Completed PHQ-2 or PHQ-9 in the last 360 days      Passed - Valid encounter within last 12 months  Recent Outpatient Visits           2 weeks ago Type 2 diabetes mellitus with peripheral neuropathy [facility])   [place] [facility] Larae Grooms, NP   2 months ago Hypertension associated with diabetes [facility])   [place] [facility] Larae Grooms, NP   4 months ago COVID-19   [place] [facility] Larae Grooms, NP   5 months ago Annual physical exam   [place] [facility] Larae Grooms, NP   6 months ago Viral upper respiratory tract infection   [place] [facility] Larae Grooms, NP       Future Appointments             In 2 weeks Larae Grooms, NP [place] [facility], [facility]             donepezil (ARICEPT) 10 MG tablet [Pharmacy Med Name: DONEPEZIL HCL 10 MG TAB] 90 tablet 1    Sig: TAKE ONE TABLET (10 MG) BY MOUTH AT BEDTIME     Neurology:  Alzheimer's Agents Passed - 04/01/2023 11:52 AM      Passed - Valid encounter within last 6 months    Recent Outpatient Visits           2 weeks ago Type 2 diabetes mellitus with  peripheral neuropathy (HCC)   [place] [facility] Larae Grooms, NP   2 months ago Hypertension associated with diabetes [facility])   [place] [facility] Larae Grooms, NP   4 months ago COVID-19   [place] [facility] Larae Grooms, NP   5 months ago Annual physical exam   [place] [facility] Larae Grooms, NP   6 months ago Viral upper respiratory tract infection    [facility] Larae Grooms, NP       Future Appointments             In 2 weeks Larae Grooms, NP  [facility], [facility]             venlafaxine XR (EFFEXOR-XR) 75 MG 24 hr capsule [Pharmacy Med Name: VENLAFAXINE HCL ER 75 MG CAP] 90 capsule 1    Sig: TAKE 1 CAPSULE BY MOUTH ONCE DAILY.     Psychiatry: Antidepressants - SNRI - desvenlafaxine & venlafaxine Failed - 04/01/2023 11:52 AM      Failed - Lipid Panel in normal range within the last 12 months    Cholesterol, Total  Date Value Ref Range Status  01/15/2023 189 100 - 199 mg/dL Final   LDL Chol Calc (NIH)  Date Value Ref Range Status  01/15/2023 93 0 - 99 mg/dL Final   HDL  Date Value Ref Range Status  01/15/2023 50 >39 mg/dL Final   Triglycerides  Date Value Ref Range Status  01/15/2023 273 (H) 0 - 149 mg/dL Final         Passed - Cr in normal range and within 360 days    Creatinine, Ser  Date Value Ref Range Status  01/15/2023 0.96 0.57 - 1.00 mg/dL Final         Passed - Completed PHQ-2 or PHQ-9 in the last 360 days      Passed - Last BP in normal range    BP Readings from Last 1 Encounters:  03/13/23 114/70         Passed - Valid encounter within last 6 months    Recent  Outpatient Visits           2 weeks ago Type 2 diabetes mellitus with peripheral neuropathy [facility](HCC)   [place] [facility] Karen, NP   2 months ago Hypertension associated with diabetes [facility](HCC)   [place]  [facility] Larae Grooms[facility]sworth, Karen, NP   4 months ago COVID-19   [facility] Larae GroomsHoldsworth, Karen, NP   5 months ago Annual physical exam   [place] [facility] Larae GroomsHoldsworth, Karen, NP   6 months ago Viral upper respiratory tract infection   [place] [facility] Larae GroomsHoldsworth, Karen, NP       Future Appointments             In 2 weeks Larae GroomsHoldsworth, Karen, NP [place] [facility]

## 2023-04-03 DIAGNOSIS — I480 Paroxysmal atrial fibrillation: Secondary | ICD-10-CM | POA: Diagnosis not present

## 2023-04-03 DIAGNOSIS — M6281 Muscle weakness (generalized): Secondary | ICD-10-CM | POA: Diagnosis not present

## 2023-04-03 DIAGNOSIS — A419 Sepsis, unspecified organism: Secondary | ICD-10-CM | POA: Diagnosis not present

## 2023-04-03 DIAGNOSIS — J189 Pneumonia, unspecified organism: Secondary | ICD-10-CM | POA: Diagnosis not present

## 2023-04-03 DIAGNOSIS — R2689 Other abnormalities of gait and mobility: Secondary | ICD-10-CM | POA: Diagnosis not present

## 2023-04-03 DIAGNOSIS — R279 Unspecified lack of coordination: Secondary | ICD-10-CM | POA: Diagnosis not present

## 2023-04-03 DIAGNOSIS — G301 Alzheimer's disease with late onset: Secondary | ICD-10-CM | POA: Diagnosis not present

## 2023-04-04 DIAGNOSIS — G3184 Mild cognitive impairment, so stated: Secondary | ICD-10-CM | POA: Diagnosis not present

## 2023-04-04 DIAGNOSIS — R488 Other symbolic dysfunctions: Secondary | ICD-10-CM | POA: Diagnosis not present

## 2023-04-09 ENCOUNTER — Ambulatory Visit (INDEPENDENT_AMBULATORY_CARE_PROVIDER_SITE_OTHER): Payer: Medicare PPO

## 2023-04-09 VITALS — BP 110/70 | Ht 66.0 in | Wt 170.0 lb

## 2023-04-09 DIAGNOSIS — G3184 Mild cognitive impairment, so stated: Secondary | ICD-10-CM | POA: Diagnosis not present

## 2023-04-09 DIAGNOSIS — Z0000 Encounter for general adult medical examination without abnormal findings: Secondary | ICD-10-CM | POA: Diagnosis not present

## 2023-04-09 DIAGNOSIS — R488 Other symbolic dysfunctions: Secondary | ICD-10-CM | POA: Diagnosis not present

## 2023-04-09 NOTE — Progress Notes (Signed)
Subjective:   Alison Richardson is a 89 y.o. female who presents for Medicare Annual (Subsequent) preventive examination.  Review of Systems     Cardiac Risk Factors include: advanced age (>82men, >70 women);diabetes mellitus;hypertension;family history of premature cardiovascular disease     Objective:    Today's Vitals   04/09/23 1057 04/09/23 1100  BP: 110/70   Weight: 170 lb (77.1 kg)   Height: 5\' 6"  (1.676 m)   PainSc:  4    Body mass index is 27.44 kg/m.     04/09/2023   11:07 AM 12/10/2022   10:59 AM 12/09/2022    3:59 AM 05/16/2022    1:56 AM 03/20/2022   10:09 AM 01/26/2022    8:00 AM 01/25/2022    7:24 AM  Advanced Directives  Does Patient Have a Medical Advance Directive? Yes Yes Yes Yes No Yes Yes  Type of Estate agent of [place];Living will Healthcare Power of [place];Living will  Out of facility DNR (pink MOST or yellow form)  Living will;Out of facility DNR (pink MOST or yellow form);Healthcare Power of Attorney Living will;Out of facility DNR (pink MOST or yellow form);Healthcare Power of Attorney  Does patient want to make changes to medical advance directive? No - Patient declined No - Patient declined No - Patient declined No - Patient declined  No - Patient declined   Copy of Healthcare Power of Attorney in Chart? Yes - validated most recent copy scanned in chart (See row information) No - copy requested       Would patient like information on creating a medical advance directive?     No - Patient declined      Current Medications (verified) Outpatient Encounter Medications as of 04/09/2023  Medication Sig   amiodarone (PACERONE) 200 MG tablet Take 200 mg by mouth daily.    clopidogrel (PLAVIX) 75 MG tablet Take 75 mg by mouth daily.   dapagliflozin propanediol (FARXIGA) 10 MG TABS tablet TAKE 1 TABLET BY MOUTH DAILY BEFORE BREAKFAST   diltiazem (CARDIZEM CD) 240 MG 24 hr capsule Take 240 mg by mouth daily.   donepezil (ARICEPT) 10 MG  tablet TAKE ONE TABLET (10 MG) BY MOUTH AT BEDTIME   ELIQUIS 5 MG TABS tablet Take 5 mg by mouth 2 (two) times daily.    famotidine (PEPCID) 20 MG tablet TAKE 1 TABLET BY MOUTH ONCE DAILY   fluticasone (FLONASE) 50 MCG/ACT nasal spray Place 1 spray into both nostrils daily.   gabapentin (NEURONTIN) 100 MG capsule TAKE 1 CAPSULE BY MOUTH DAILY   glipiZIDE (GLUCOTROL XL) 5 MG 24 hr tablet TAKE 1 TABLET BY MOUTH DAILY   isosorbide mononitrate (IMDUR) 30 MG 24 hr tablet Take 30 mg by mouth daily.   levothyroxine (SYNTHROID) 88 MCG tablet Take 1 tablet (88 mcg total) by mouth daily.   metoprolol succinate (TOPROL-XL) 100 MG 24 hr tablet TAKE ONE TABLET (100 MG) BY MOUTH EVERY DAY (TAKE WITH OR IMMEDIATELY AFTER A MEAL)   nitroGLYCERIN (NITROSTAT) 0.4 MG SL tablet Place 0.4 mg under the tongue every 5 (five) minutes as needed for chest pain.   pravastatin (PRAVACHOL) 20 MG tablet Take 20 mg by mouth at bedtime.   spironolactone (ALDACTONE) 25 MG tablet Take 0.5 tablets (12.5 mg total) by mouth daily.   torsemide (DEMADEX) 20 MG tablet Take 1 tablet (20 mg total) by mouth daily.   venlafaxine XR (EFFEXOR-XR) 75 MG 24 hr capsule TAKE 1 CAPSULE BY MOUTH ONCE DAILY.   desloratadine (  CLARINEX) 5 MG tablet TAKE 1 TABLET BY MOUTH ONCE DAILY (Patient not taking: Reported on 04/09/2023)   No facility-administered encounter medications on file as of 04/09/2023.    Allergies (verified) Pantoprazole and Metformin and related   History: Past Medical History:  Diagnosis Date   Arrhythmia    Atrial flutter 01/2018   new onset    Basal cell carcinoma of back    Basal cell carcinoma of lip    Cerebral aneurysm    followed by [facility]   CHF (congestive heart failure)    DDD (degenerative disc disease), lumbar    superior plate depression, L3 08/18/2014   Diabetes mellitus type II, controlled    Diverticulosis    Dysrhythmia    Paroxysmal Supraventricular Tachycardia   Dysthymia    depression   GERD  (gastroesophageal reflux disease)    History of meniscal tear    Hyperlipidemia    Hypertension    Hypothyroidism    Late onset Alzheimer's disease with behavioral disturbance    Mild pulmonary hypertension    Overactive bladder    Peripheral vascular disease    Seasonal allergic rhinitis    Sleep apnea    Past Surgical History:  Procedure Laterality Date   APPENDECTOMY  1946   BASAL CELL CARCINOMA EXCISION     2006 and 2009 removed from back and lip   CARDIOVASCULAR STRESS TEST  2015   nuclear cardiac stress test negative for ischemia - Dr. Tonita Phoenix   CATARACT EXTRACTION, BILATERAL  2006   COLONOSCOPY  2014   COLONOSCOPY N/A 08/19/2020   Procedure: COLONOSCOPY;  Surgeon: Regis Bill, MD;  Location: [facility] ENDOSCOPY;  Service: Endoscopy;  Laterality: N/A;   ECTOPIC PREGNANCY SURGERY  1957   ESOPHAGOGASTRODUODENOSCOPY N/A 08/19/2020   Procedure: ESOPHAGOGASTRODUODENOSCOPY (EGD);  Surgeon: Regis Bill, MD;  Location: [facility] ENDOSCOPY;  Service: Endoscopy;  Laterality: N/A;   INJECTION KNEE Left 05/2015   LEFT HEART CATH AND CORONARY ANGIOGRAPHY N/A 10/12/2021   Procedure: LEFT HEART CATH AND CORONARY ANGIOGRAPHY;  Surgeon: Lamar Blinks, MD;  Location: [facility] INVASIVE CV LAB;  Service: Cardiovascular;  Laterality: N/A;   REPLACEMENT TOTAL KNEE Left 09/06/2016   Dr. Suzette Battiest   TONSILLECTOMY AND ADENOIDECTOMY  1953   TOTAL ABDOMINAL HYSTERECTOMY  1986   [facility] B   Family History  Problem Relation Age of Onset   Hypertension Mother    Heart disease Father    CAD Father    Heart disease Sister    Diabetes Sister    Diabetes Paternal Uncle    Sleep apnea Son    Social History   Socioeconomic History   Marital status: Widowed    Spouse name: Not on file   Number of children: 5   Years of education: Not on file   Highest education level: Not on file  Occupational History   Not on file  Tobacco Use   Smoking status: Never   Smokeless tobacco: Never  Vaping Use    Vaping Use: Never used  Substance and Sexual Activity   Alcohol use: Yes    Alcohol/week: 3.0 standard drinks of alcohol    Types: 3 Shots of liquor per week    Comment: 3 x week    Drug use: Never   Sexual activity: Not Currently  Other Topics Concern   Not on file  Social History Narrative   Not on file   Social Determinants of Health   Financial Resource Strain: Low Risk  (04/09/2023)  Overall Financial Resource Strain (CARDIA)    Difficulty of Paying Living Expenses: Not hard at all  Food Insecurity: No Food Insecurity (04/09/2023)   Hunger Vital Sign    Worried About Running Out of Food in the Last Year: Never true    Ran Out of Food in the Last Year: Never true  Transportation Needs: No Transportation Needs (04/09/2023)   PRAPARE - Administrator, Civil Service (Medical): No    Lack of Transportation (Non-Medical): No  Physical Activity: Insufficiently Active (04/09/2023)   Exercise Vital Sign    Days of Exercise per Week: 4 days    Minutes of Exercise per Session: 20 min  Stress: No Stress Concern Present (04/09/2023)   Alison Richardson of Occupational Health - Occupational Stress Questionnaire    Feeling of Stress : Not at all  Social Connections: Socially Isolated (04/09/2023)   Social Connection and Isolation Panel [NHANES]    Frequency of Communication with Friends and Family: More than three times a week    Frequency of Social Gatherings with Friends and Family: More than three times a week    Attends Religious Services: Never    Database administrator or Organizations: No    Attends Banker Meetings: Never    Marital Status: Widowed    Tobacco Counseling Counseling given: Not Answered   Clinical Intake:  Pre-visit preparation completed: Yes  Pain : 0-10 Pain Score: 4  Pain Type: Chronic pain Pain Location: Back Pain Orientation: Mid     Nutritional Risks: None Diabetes: Yes CBG done?: No Did pt. bring in CBG monitor from  home?: No  How often do you need to have someone help you when you read instructions, pamphlets, or other written materials from your doctor or pharmacy?: 1 - Never  Diabetic?yes Nutrition Risk Assessment:  Has the patient had any N/V/D within the last 2 months?  Yes  Does the patient have any non-healing wounds?  No  Has the patient had any unintentional weight loss or weight gain?  No   Diabetes:  Is the patient diabetic?  Yes  If diabetic, was a CBG obtained today?  No  Did the patient bring in their glucometer from home?  No  How often do you monitor your CBG's? Every other day.   Financial Strains and Diabetes Management:  Are you having any financial strains with the device, your supplies or your medication? No .  Does the patient want to be seen by Chronic Care Management for management of their diabetes?  No  Would the patient like to be referred to a Nutritionist or for Diabetic Management?  No   Diabetic Exams:  Diabetic Eye Exam: Completed 09/24/22. Marland Kitchen Pt has been advised about the importance in completing this exam.  Diabetic Foot Exam: Completed 10/15/22. Pt has been advised about the importance in completing this exam.   Interpreter Needed?: No  Information entered by :: Kennedy Bucker, LPN   Activities of Daily Living    04/09/2023   11:09 AM 12/10/2022   11:01 AM  In your present state of health, do you have any difficulty performing the following activities:  Hearing? 0   Vision? 0   Difficulty concentrating or making decisions? 1   Comment memory   Walking or climbing stairs? 1   Dressing or bathing? 0   Doing errands, shopping? 0 0  Preparing Food and eating ? N   Using the Toilet? N   In the past six  months, have you accidently leaked urine? N   Do you have problems with loss of bowel control? N   Managing your Medications? N   Managing your Finances? N   Housekeeping or managing your Housekeeping? Y     Patient Care Team: Larae Grooms, NP  as PCP - General (Nurse Practitioner)  Indicate any recent Medical Services you may have received from other than [facility] providers in the past year (date may be approximate).     Assessment:   This is a routine wellness examination for Alison Richardson.  Hearing/Vision screen Hearing Screening - Comments:: Wears aids Vision Screening - Comments:: Wears glasses- [facility] Dr.[place]de[place]  Dietary issues and exercise activities discussed: Current Exercise Habits: Home exercise routine, Time (Minutes): 20, Frequency (Times/Week): 4, Weekly Exercise (Minutes/Week): 80, Intensity: Mild   Goals Addressed             This Visit's Progress    DIET - EAT MORE FRUITS AND VEGETABLES         Depression Screen    04/09/2023   11:04 AM 01/15/2023    3:47 PM 11/06/2022    1:59 PM 08/21/2022    1:47 PM 04/12/2022    2:46 PM 03/20/2022    9:35 AM 01/22/2022    5:07 PM  PHQ 2/9 Scores  PHQ - 2 Score 0 0  0  0 1  PHQ- 9 Score 0 6     9  Exception Documentation   Patient refusal  Patient refusal      Fall Risk    04/09/2023   11:08 AM 01/15/2023    3:47 PM 11/06/2022    1:59 PM 09/04/2022    1:38 PM 08/21/2022    1:46 PM  Fall Risk   Falls in the past year? 1 1 0 1 1  Number falls in past yr: 0 1 0 0 1  Injury with Fall? 1 1 0 1 1  Risk for fall due to : History of fall(s) History of fall(s) No Fall Risks History of fall(s) Impaired balance/gait;Impaired mobility;History of fall(s)  Follow up Falls prevention discussed;Falls evaluation completed Falls evaluation completed Falls evaluation completed  Falls evaluation completed;Education provided;Falls prevention discussed    FALL RISK PREVENTION PERTAINING TO THE HOME:  Any stairs in or around the home? Yes  If so, are there any without handrails? No  Home free of loose throw rugs in walkways, pet beds, electrical cords, etc? Yes  Adequate lighting in your home to reduce risk of falls? Yes   ASSISTIVE DEVICES UTILIZED TO PREVENT FALLS:  Life  alert? Yes  Use of a cane, walker or w/c? Yes - cane or walker all the time Grab bars in the bathroom? Yes  Shower chair or bench in shower? Yes  Elevated toilet seat or a handicapped toilet? Yes   TIMED UP AND GO:  Was the test performed? Yes .  Length of time to ambulate 10 feet: 4 sec.   Gait steady and fast with assistive device  Cognitive Function:        Immunizations Immunization History  Administered Date(s) Administered   Fluad Quad(high Dose 65+) 09/08/2019, 09/23/2021, 10/10/2022   Influenza-Unspecified 10/15/2018   PFIZER Comirnaty(Gray Top)Covid-19 Tri-Sucrose Vaccine 12/31/2019, 01/27/2020, 09/17/2020   PFIZER(Purple Top)SARS-COV-2 Vaccination 12/31/2019, 01/27/2020, 09/17/2020   PNEUMOCOCCAL CONJUGATE-20 10/15/2022   Td 09/29/2019, 10/01/2019    TDAP status: Up to date  Flu Vaccine status: Up to date  Pneumococcal vaccine status: Up to date  Covid-19 vaccine status: Completed  vaccines  Qualifies for Shingles Vaccine? Yes   Zostavax completed No   Shingrix Completed?: No.    Education has been provided regarding the importance of this vaccine. Patient has been advised to call insurance company to determine out of pocket expense if they have not yet received this vaccine. Advised may also receive vaccine at local pharmacy or Health Dept. Verbalized acceptance and understanding.  Screening Tests Health Maintenance  Topic Date Due   Zoster Vaccines- Shingrix (1 of 2) Never done   COVID-19 Vaccine (7 - 2023-24 season) 08/24/2022   INFLUENZA VACCINE  07/25/2023   HEMOGLOBIN A1C  09/13/2023   OPHTHALMOLOGY EXAM  09/25/2023   FOOT EXAM  10/16/2023   Medicare Annual Wellness (AWV)  04/08/2024   DTaP/Tdap/Td (3 - Tdap) 09/30/2029   Pneumonia Vaccine 68+ Years old  Completed   DEXA SCAN  Completed   HPV VACCINES  Aged Out    Health Maintenance  Health Maintenance Due  Topic Date Due   Zoster Vaccines- Shingrix (1 of 2) Never done   COVID-19 Vaccine (7  - 2023-24 season) 08/24/2022    Colorectal cancer screening: No longer required.   Mammogram status: No longer required due to age.  Lung Cancer Screening: (Low Dose CT Chest recommended if Age 61-80 years, 30 pack-year currently smoking OR have quit w/in 15years.) does not qualify.   Additional Screening:  Hepatitis C Screening: does not qualify; Completed no  Vision Screening: Recommended annual ophthalmology exams for early detection of glaucoma and other disorders of the eye. Is the patient up to date with their annual eye exam?  Yes  Who is the provider or what is the name of the office in which the patient attends annual eye exams? [facility] If pt is not established with a provider, would they like to be referred to a provider to establish care? No .   Dental Screening: Recommended annual dental exams for proper oral hygiene  Community Resource Referral / Chronic Care Management: CRR required this visit?  No   CCM required this visit?  No      Plan:     I have personally reviewed and noted the following in the patient's chart:   Medical and social history Use of alcohol, tobacco or illicit drugs  Current medications and supplements including opioid prescriptions. Patient is not currently taking opioid prescriptions. Functional ability and status Nutritional status Physical activity Advanced directives List of other physicians Hospitalizations, surgeries, and ER visits in previous 12 months Vitals Screenings to include cognitive, depression, and falls Referrals and appointments  In addition, I have reviewed and discussed with patient certain preventive protocols, quality metrics, and best practice recommendations. A written personalized care plan for preventive services as well as general preventive health recommendations were provided to patient.     Hal Hope, LPN   1/61/0960   Nurse Notes: none

## 2023-04-09 NOTE — Patient Instructions (Signed)
Alison Richardson , Thank you for taking time to come for your Medicare Wellness Visit. I appreciate your ongoing commitment to your health goals. Please review the following plan we discussed and let me know if I can assist you in the future.   These are the goals we discussed:  Goals      DIET - EAT MORE FRUITS AND VEGETABLES     Increase physical activity        This is a list of the screening recommended for you and due dates:  Health Maintenance  Topic Date Due   Zoster (Shingles) Vaccine (1 of 2) Never done   COVID-19 Vaccine (7 - 2023-24 season) 08/24/2022   Flu Shot  07/25/2023   Hemoglobin A1C  09/13/2023   Eye exam for diabetics  09/25/2023   Complete foot exam   10/16/2023   Medicare Annual Wellness Visit  04/08/2024   DTaP/Tdap/Td vaccine (3 - Tdap) 09/30/2029   Pneumonia Vaccine  Completed   DEXA scan (bone density measurement)  Completed   HPV Vaccine  Aged Out    Advanced directives: yes  Conditions/risks identified: none  Next appointment: Follow up in one year for your annual wellness visit 04/14/24 @ 9:15 am in person   Preventive Care 65 Years and Older, Female Preventive care refers to lifestyle choices and visits with your health care provider that can promote health and wellness. What does preventive care include? A yearly physical exam. This is also called an annual well check. Dental exams once or twice a year. Routine eye exams. Ask your health care provider how often you should have your eyes checked. Personal lifestyle choices, including: Daily care of your teeth and gums. Regular physical activity. Eating a healthy diet. Avoiding tobacco and drug use. Limiting alcohol use. Practicing safe sex. Taking low-dose aspirin every day. Taking vitamin and mineral supplements as recommended by your health care provider. What happens during an annual well check? The services and screenings done by your health care provider during your annual well check  will depend on your age, overall health, lifestyle risk factors, and family history of disease. Counseling  Your health care provider may ask you questions about your: Alcohol use. Tobacco use. Drug use. Emotional well-being. Home and relationship well-being. Sexual activity. Eating habits. History of falls. Memory and ability to understand (cognition). Work and work Astronomer. Reproductive health. Screening  You may have the following tests or measurements: Height, weight, and BMI. Blood pressure. Lipid and cholesterol levels. These may be checked every 5 years, or more frequently if you are over 44 years old. Skin check. Lung cancer screening. You may have this screening every year starting at age 60 if you have a 30-pack-year history of smoking and currently smoke or have quit within the past 15 years. Fecal occult blood test (FOBT) of the stool. You may have this test every year starting at age 34. Flexible sigmoidoscopy or colonoscopy. You may have a sigmoidoscopy every 5 years or a colonoscopy every 10 years starting at age 41. Hepatitis C blood test. Hepatitis B blood test. Sexually transmitted disease (STD) testing. Diabetes screening. This is done by checking your blood sugar (glucose) after you have not eaten for a while (fasting). You may have this done every 1-3 years. Bone density scan. This is done to screen for osteoporosis. You may have this done starting at age 36. Mammogram. This may be done every 1-2 years. Talk to your health care provider about how often you should  have regular mammograms. Talk with your health care provider about your test results, treatment options, and if necessary, the need for more tests. Vaccines  Your health care provider may recommend certain vaccines, such as: Influenza vaccine. This is recommended every year. Tetanus, diphtheria, and acellular pertussis (Tdap, Td) vaccine. You may need a Td booster every 10 years. Zoster vaccine. You  may need this after age 41. Pneumococcal 13-valent conjugate (PCV13) vaccine. One dose is recommended after age 42. Pneumococcal polysaccharide (PPSV23) vaccine. One dose is recommended after age 16. Talk to your health care provider about which screenings and vaccines you need and how often you need them. This information is not intended to replace advice given to you by your health care provider. Make sure you discuss any questions you have with your health care provider. Document Released: 01/06/2016 Document Revised: 08/29/2016 Document Reviewed: 10/11/2015 Elsevier Interactive Patient Education  2017 [place] Prevention in the Home Falls can cause injuries. They can happen to people of all ages. There are many things you can do to make your home safe and to help prevent falls. What can I do on the outside of my home? Regularly fix the edges of walkways and driveways and fix any cracks. Remove anything that might make you trip as you walk through a door, such as a raised step or threshold. Trim any bushes or trees on the path to your home. Use bright outdoor lighting. Clear any walking paths of anything that might make someone trip, such as rocks or tools. Regularly check to see if handrails are loose or broken. Make sure that both sides of any steps have handrails. Any raised decks and porches should have guardrails on the edges. Have any leaves, snow, or ice cleared regularly. Use sand or salt on walking paths during winter. Clean up any spills in your garage right away. This includes oil or grease spills. What can I do in the bathroom? Use night lights. Install grab bars by the toilet and in the tub and shower. Do not use towel bars as grab bars. Use non-skid mats or decals in the tub or shower. If you need to sit down in the shower, use a plastic, non-slip stool. Keep the floor dry. Clean up any water that spills on the floor as soon as it happens. Remove soap buildup  in the tub or shower regularly. Attach bath mats securely with double-sided non-slip rug tape. Do not have throw rugs and other things on the floor that can make you trip. What can I do in the bedroom? Use night lights. Make sure that you have a light by your bed that is easy to reach. Do not use any sheets or blankets that are too big for your bed. They should not hang down onto the floor. Have a firm chair that has side arms. You can use this for support while you get dressed. Do not have throw rugs and other things on the floor that can make you trip. What can I do in the kitchen? Clean up any spills right away. Avoid walking on wet floors. Keep items that you use a lot in easy-to-reach places. If you need to reach something above you, use a strong step stool that has a grab bar. Keep electrical cords out of the way. Do not use floor polish or wax that makes floors slippery. If you must use wax, use non-skid floor wax. Do not have throw rugs and other things on the floor  that can make you trip. What can I do with my stairs? Do not leave any items on the stairs. Make sure that there are handrails on both sides of the stairs and use them. Fix handrails that are broken or loose. Make sure that handrails are as long as the stairways. Check any carpeting to make sure that it is firmly attached to the stairs. Fix any carpet that is loose or worn. Avoid having throw rugs at the top or bottom of the stairs. If you do have throw rugs, attach them to the floor with carpet tape. Make sure that you have a light switch at the top of the stairs and the bottom of the stairs. If you do not have them, ask someone to add them for you. What else can I do to help prevent falls? Wear shoes that: Do not have high heels. Have rubber bottoms. Are comfortable and fit you well. Are closed at the toe. Do not wear sandals. If you use a stepladder: Make sure that it is fully opened. Do not climb a closed  stepladder. Make sure that both sides of the stepladder are locked into place. Ask someone to hold it for you, if possible. Clearly mark and make sure that you can see: Any grab bars or handrails. First and last steps. Where the edge of each step is. Use tools that help you move around (mobility aids) if they are needed. These include: Canes. Walkers. Scooters. Crutches. Turn on the lights when you go into a dark area. Replace any light bulbs as soon as they burn out. Set up your furniture so you have a clear path. Avoid moving your furniture around. If any of your floors are uneven, fix them. If there are any pets around you, be aware of where they are. Review your medicines with your doctor. Some medicines can make you feel dizzy. This can increase your chance of falling. Ask your doctor what other things that you can do to help prevent falls. This information is not intended to replace advice given to you by your health care provider. Make sure you discuss any questions you have with your health care provider. Document Released: 10/06/2009 Document Revised: 05/17/2016 Document Reviewed: 01/14/2015 Elsevier Interactive Patient Education  2017 [employer].

## 2023-04-10 DIAGNOSIS — A419 Sepsis, unspecified organism: Secondary | ICD-10-CM | POA: Diagnosis not present

## 2023-04-10 DIAGNOSIS — R2689 Other abnormalities of gait and mobility: Secondary | ICD-10-CM | POA: Diagnosis not present

## 2023-04-10 DIAGNOSIS — I480 Paroxysmal atrial fibrillation: Secondary | ICD-10-CM | POA: Diagnosis not present

## 2023-04-10 DIAGNOSIS — R279 Unspecified lack of coordination: Secondary | ICD-10-CM | POA: Diagnosis not present

## 2023-04-10 DIAGNOSIS — G301 Alzheimer's disease with late onset: Secondary | ICD-10-CM | POA: Diagnosis not present

## 2023-04-10 DIAGNOSIS — J189 Pneumonia, unspecified organism: Secondary | ICD-10-CM | POA: Diagnosis not present

## 2023-04-10 DIAGNOSIS — M6281 Muscle weakness (generalized): Secondary | ICD-10-CM | POA: Diagnosis not present

## 2023-04-12 DIAGNOSIS — M546 Pain in thoracic spine: Secondary | ICD-10-CM | POA: Insufficient documentation

## 2023-04-12 DIAGNOSIS — M545 Low back pain, unspecified: Secondary | ICD-10-CM | POA: Diagnosis not present

## 2023-04-12 DIAGNOSIS — E119 Type 2 diabetes mellitus without complications: Secondary | ICD-10-CM | POA: Diagnosis not present

## 2023-04-16 ENCOUNTER — Ambulatory Visit: Payer: Medicare PPO | Admitting: Nurse Practitioner

## 2023-04-16 ENCOUNTER — Encounter: Payer: Self-pay | Admitting: Nurse Practitioner

## 2023-04-16 VITALS — BP 121/81 | HR 106 | Temp 97.9°F | Wt 169.1 lb

## 2023-04-16 DIAGNOSIS — E785 Hyperlipidemia, unspecified: Secondary | ICD-10-CM | POA: Diagnosis not present

## 2023-04-16 DIAGNOSIS — E1142 Type 2 diabetes mellitus with diabetic polyneuropathy: Secondary | ICD-10-CM | POA: Diagnosis not present

## 2023-04-16 DIAGNOSIS — E538 Deficiency of other specified B group vitamins: Secondary | ICD-10-CM | POA: Diagnosis not present

## 2023-04-16 DIAGNOSIS — M545 Low back pain, unspecified: Secondary | ICD-10-CM | POA: Diagnosis not present

## 2023-04-16 DIAGNOSIS — E1159 Type 2 diabetes mellitus with other circulatory complications: Secondary | ICD-10-CM

## 2023-04-16 DIAGNOSIS — E1169 Type 2 diabetes mellitus with other specified complication: Secondary | ICD-10-CM | POA: Diagnosis not present

## 2023-04-16 DIAGNOSIS — R488 Other symbolic dysfunctions: Secondary | ICD-10-CM | POA: Diagnosis not present

## 2023-04-16 DIAGNOSIS — E039 Hypothyroidism, unspecified: Secondary | ICD-10-CM

## 2023-04-16 DIAGNOSIS — I5032 Chronic diastolic (congestive) heart failure: Secondary | ICD-10-CM

## 2023-04-16 DIAGNOSIS — G3184 Mild cognitive impairment, so stated: Secondary | ICD-10-CM | POA: Diagnosis not present

## 2023-04-16 DIAGNOSIS — I482 Chronic atrial fibrillation, unspecified: Secondary | ICD-10-CM

## 2023-04-16 DIAGNOSIS — I152 Hypertension secondary to endocrine disorders: Secondary | ICD-10-CM

## 2023-04-16 DIAGNOSIS — R7989 Other specified abnormal findings of blood chemistry: Secondary | ICD-10-CM

## 2023-04-16 NOTE — Assessment & Plan Note (Signed)
Chronic.  Controlled.  Continue with Imdur , Amiodarone  and Metoprolol .  Amiodarone was recently decreased by Dr Cassie Freer.  Labs ordered today.  Follow up in 3 months for reevaluation.

## 2023-04-16 NOTE — Assessment & Plan Note (Addendum)
Chronic, Improved since hospitalization. Continue to collaborate with cardiology.  Continue with regimen of Imdur, Metoprolol and Amidoarone.  Encouraged patient to start medication.  Followed by HF clinic.  Now seeing Cardiology, Dr. Cassie Freer.  Recommend: - Reminded to call for an overnight weight gain of >2 pounds or a weekly weight gain of >5 pounds - not adding salt to food and read food labels. Reviewed the importance of keeping daily sodium intake to 2000mg  daily. - Avoid Ibuprofen products.

## 2023-04-16 NOTE — Assessment & Plan Note (Signed)
Chronic.  Sugars are typically <200.  A1c checked 1 month ago and well controlled at 7.8%.  Recently took a course of prednisone.  Continue with [place].  Can add Rybelsus if needed.  Follow up in 3 months.  Call sooner if concerns arise.

## 2023-04-16 NOTE — Assessment & Plan Note (Signed)
Labs ordered at visit today.  Will make recommendations based on lab results.   

## 2023-04-16 NOTE — Progress Notes (Signed)
BP 121/81   Pulse (!) 106   Temp 97.9 F (36.6 C) (Oral)   Wt 169 lb 1.6 oz (76.7 kg)   SpO2 97%   BMI 27.29 kg/m    Subjective:    Patient ID: Alison Richardson, female    DOB: 1933/06/06, 89 y.o.   MRN: [id number]  HPI: Alison Richardson is a 89 y.o. female  Chief Complaint  Patient presents with   Diabetes   Hyperlipidemia   Hypertension   Back Pain    CHF  Patient states she has been feeling well.  Denies concerns regarding her heart at visit today.  Her Amiodarone was decreased from 200mg  to 100mg  daily.  HYPERTENSION with Chronic Kidney Disease Hypertension status: controlled  Satisfied with current treatment? yes Duration of hypertension: years BP monitoring frequency:  daily BP range: 110-120/80 BP medication side effects:  no Medication compliance: excellent compliance Previous BP meds: torsemide, metoprolol and cardizem Aspirin: no Recurrent headaches: no Visual changes: no Palpitations: no Dyspnea: no Chest pain: no Lower extremity edema: no Dizzy/lightheaded: no  DIABETES Has not started the [place] yet.  Was changed from Jardiance in the hospital.   Hypoglycemic episodes:no Polydipsia/polyuria: no Visual disturbance: no Chest pain: no Paresthesias: no Glucose Monitoring: yes  Accucheck frequency: Daily  Fasting glucose: >200  Post prandial:  Evening:  Before meals: Taking Insulin?: no  Long acting insulin:  Short acting insulin:  HYPOTHYROIDISM Thyroid control status:controlled Satisfied with current treatment? yes Medication side effects: no Medication compliance: excellent compliance Etiology of hypothyroidism:  Recent dose adjustment:no Fatigue: yes Cold intolerance: no Heat intolerance: no Weight gain: no Weight loss: no Constipation: no Diarrhea/loose stools: no Palpitations: no Lower extremity edema: no Anxiety/depressed mood: no   Patient had a fall on April 8 and was able to get up.  She went to [facility] and had no  fracture.  Patient states her back pain was really bad and she has been taking tizanidine and Tramadol.  She has been feeling really fatigued and not right.  She was not able to drive here.      Relevant past medical, surgical, family and social history reviewed and updated as indicated. Interim medical history since our last visit reviewed. Allergies and medications reviewed and updated.  Review of Systems  Constitutional:  Positive for fatigue.  Eyes:  Negative for visual disturbance.  Respiratory:  Negative for chest tightness and shortness of breath.   Cardiovascular:  Negative for chest pain, palpitations and leg swelling.  Endocrine: Negative for polydipsia and polyuria.  Musculoskeletal:  Positive for back pain.  Neurological:  Negative for dizziness, light-headedness, numbness and headaches.    Per HPI unless specifically indicated above     Objective:    BP 121/81   Pulse (!) 106   Temp 97.9 F (36.6 C) (Oral)   Wt 169 lb 1.6 oz (76.7 kg)   SpO2 97%   BMI 27.29 kg/m   Wt Readings from Last 3 Encounters:  04/16/23 169 lb 1.6 oz (76.7 kg)  04/09/23 170 lb (77.1 kg)  03/13/23 174 lb (78.9 kg)    Physical Exam Vitals and nursing note reviewed.  Constitutional:      General: She is not in acute distress.    Appearance: Normal appearance. She is normal weight. She is not ill-appearing, toxic-appearing or diaphoretic.  HENT:     Head: Normocephalic.     Right Ear: External ear normal.     Left Ear: External ear normal.  Nose: Nose normal.     Mouth/Throat:     Mouth: Mucous membranes are moist.     Pharynx: Oropharynx is clear.  Eyes:     General:        Right eye: No discharge.        Left eye: No discharge.     Extraocular Movements: Extraocular movements intact.     Conjunctiva/sclera: Conjunctivae normal.     Pupils: Pupils are equal, round, and reactive to light.  Cardiovascular:     Rate and Rhythm: Normal rate and regular rhythm.     Heart sounds:  No murmur heard. Pulmonary:     Effort: Pulmonary effort is normal. No respiratory distress.     Breath sounds: Normal breath sounds. No wheezing or rales.  Musculoskeletal:     Cervical back: Normal range of motion and neck supple.  Skin:    General: Skin is warm and dry.     Capillary Refill: Capillary refill takes less than 2 seconds.  Neurological:     General: No focal deficit present.     Mental Status: She is alert and oriented to person, place, and time. Mental status is at baseline.  Psychiatric:        Mood and Affect: Mood normal.        Behavior: Behavior normal.        Thought Content: Thought content normal.        Judgment: Judgment normal.     Results for orders placed or performed in visit on 03/13/23  HgB A1c  Result Value Ref Range   Hgb A1c MFr Bld 7.5 (H) 4.8 - 5.6 %   Est. average glucose Bld gHb Est-mCnc 169 mg/dL      Assessment & Plan:   Problem List Items Addressed This Visit       Cardiovascular and Mediastinum   Hypertension associated with diabetes    Chronic, Improved since hospitalization. Continue to collaborate with cardiology.  Continue with regimen of Imdur, Metoprolol and Amidoarone.  Encouraged patient to start medication.  Followed by HF clinic.  Now seeing Cardiology, Dr. Cassie Freer.  Recommend: - Reminded to call for an overnight weight gain of >2 pounds or a weekly weight gain of >5 pounds - not adding salt to food and read food labels. Reviewed the importance of keeping daily sodium intake to 2000mg  daily. - Avoid Ibuprofen products.      Relevant Orders   Comp Met (CMET)   Chronic diastolic CHF (congestive heart failure)    Chronic, Improved since hospitalization. Continue to collaborate with cardiology.  Continue with [place]. Followed by HF clinic.  Now seeing Cardiology, Dr. Cassie Freer.  Recommend: - Reminded to call for an overnight weight gain of >2 pounds or a weekly weight gain of >5 pounds - not adding salt to food and  read food labels. Reviewed the importance of keeping daily sodium intake to 2000mg  daily. - Avoid Ibuprofen products.      Atrial fibrillation, chronic - Primary    Chronic.  Controlled.  Continue with Imdur , Amiodarone  and Metoprolol .  Amiodarone was recently decreased by Dr Cassie Freer.  Labs ordered today.  Follow up in 3 months for reevaluation.        Endocrine   Hypothyroidism    Chronic.  Controlled.  Continue with current medication regimen.  Labs ordered today.  Return to clinic in 6 months for reevaluation.  Call sooner if concerns arise.        Relevant  Orders   TSH   T4, free   Type 2 diabetes mellitus with peripheral neuropathy    Chronic.  Sugars are typically <200.  A1c checked 1 month ago and well controlled at 7.8%.  Recently took a course of prednisone.  Continue with [place].  Can add Rybelsus if needed.  Follow up in 3 months.  Call sooner if concerns arise.       Hyperlipidemia associated with type 2 diabetes mellitus    Chronic.  Controlled.  Continue with current medication regimen of Pravastatin  daily.  Labs ordered today.  Return to clinic in 3 months for reevaluation.  Call sooner if concerns arise.        Relevant Orders   Lipid Profile     Other   Vitamin B12 deficiency    Labs ordered at visit today.  Will make recommendations based on lab results.        Relevant Orders   B12   Other Visit Diagnoses     Acute low back pain without sciatica, unspecified back pain laterality       Secondary to recent fall. Tizanidine is making patient sleepy. Recommend taking it nightly instead of TID. If not able to tolerate it can change to Baclofen.  Methocarbamol was given at first but not helpful for pain.  Continue with Tramadol.  Will refill for patient if symptoms are persistent.  Follow up in 2 weeks.  Call sooner if concerns arise.         Follow up plan: Return in about 2 weeks (around 04/30/2023) for Back Pain.

## 2023-04-16 NOTE — Assessment & Plan Note (Signed)
Chronic, Improved since hospitalization. Continue to collaborate with cardiology.  Continue with [place]. Followed by HF clinic.  Now seeing Cardiology, Dr. Cassie Freer.  Recommend: - Reminded to call for an overnight weight gain of >2 pounds or a weekly weight gain of >5 pounds - not adding salt to food and read food labels. Reviewed the importance of keeping daily sodium intake to 2000mg  daily. - Avoid Ibuprofen products.

## 2023-04-16 NOTE — Assessment & Plan Note (Signed)
Chronic.  Controlled.  Continue with current medication regimen of Pravastatin 20mg daily.  Labs ordered today.  Return to clinic in 3 months for reevaluation.  Call sooner if concerns arise.   

## 2023-04-16 NOTE — Assessment & Plan Note (Addendum)
Chronic.  Controlled.  Continue with current medication regimen.  Labs ordered today.  Return to clinic in 6 months for reevaluation.  Call sooner if concerns arise.  ? ?

## 2023-04-17 LAB — COMPREHENSIVE METABOLIC PANEL
ALT: 21 IU/L (ref 0–32)
AST: 13 IU/L (ref 0–40)
Albumin/Globulin Ratio: 1.8 (ref 1.2–2.2)
Albumin: 4.7 g/dL (ref 3.7–4.7)
Alkaline Phosphatase: 157 IU/L — ABNORMAL HIGH (ref 44–121)
BUN/Creatinine Ratio: 29 — ABNORMAL HIGH (ref 12–28)
BUN: 32 mg/dL — ABNORMAL HIGH (ref 8–27)
Bilirubin Total: 0.2 mg/dL (ref 0.0–1.2)
CO2: 16 mmol/L — ABNORMAL LOW (ref 20–29)
Calcium: 9.4 mg/dL (ref 8.7–10.3)
Chloride: 99 mmol/L (ref 96–106)
Creatinine, Ser: 1.12 mg/dL — ABNORMAL HIGH (ref 0.57–1.00)
Globulin, Total: 2.6 g/dL (ref 1.5–4.5)
Glucose: 175 mg/dL — ABNORMAL HIGH (ref 70–99)
Potassium: 4.9 mmol/L (ref 3.5–5.2)
Sodium: 139 mmol/L (ref 134–144)
Total Protein: 7.3 g/dL (ref 6.0–8.5)
eGFR: 47 mL/min/{1.73_m2} — ABNORMAL LOW (ref >59)

## 2023-04-17 LAB — LIPID PANEL
Chol/HDL Ratio: 2.8 ratio (ref 0.0–4.4)
Cholesterol, Total: 211 mg/dL — ABNORMAL HIGH (ref 100–199)
HDL: 76 mg/dL (ref >39)
LDL Chol Calc (NIH): 110 mg/dL — ABNORMAL HIGH (ref 0–99)
Triglycerides: 145 mg/dL (ref 0–149)
VLDL Cholesterol Cal: 25 mg/dL (ref 5–40)

## 2023-04-17 LAB — TSH: TSH: 1.74 u[IU]/mL (ref 0.450–4.500)

## 2023-04-17 LAB — VITAMIN B12: Vitamin B-12: 404 pg/mL (ref 232–1245)

## 2023-04-17 LAB — T4, FREE: Free T4: 1.41 ng/dL (ref 0.82–1.77)

## 2023-04-17 NOTE — Addendum Note (Signed)
Addended by: Larae Grooms on: 04/17/2023 10:34 AM   Modules accepted: Orders

## 2023-04-17 NOTE — Progress Notes (Signed)
Please let patient know that her lab work shows that her kidney function has declined some.  I recommend she increase her water intake and return next week to have this repeated.  No other concerns at this time.  Continue with current medication regimen.

## 2023-04-18 DIAGNOSIS — G3184 Mild cognitive impairment, so stated: Secondary | ICD-10-CM | POA: Diagnosis not present

## 2023-04-18 DIAGNOSIS — R488 Other symbolic dysfunctions: Secondary | ICD-10-CM | POA: Diagnosis not present

## 2023-04-20 ENCOUNTER — Other Ambulatory Visit: Payer: Self-pay

## 2023-04-20 ENCOUNTER — Observation Stay
Admission: EM | Admit: 2023-04-20 | Discharge: 2023-04-21 | Disposition: A | Discharge status: Home Health | Payer: Medicare PPO | Attending: Internal Medicine | Admitting: Internal Medicine

## 2023-04-20 ENCOUNTER — Emergency Department: Payer: Medicare PPO

## 2023-04-20 DIAGNOSIS — G301 Alzheimer's disease with late onset: Secondary | ICD-10-CM | POA: Diagnosis not present

## 2023-04-20 DIAGNOSIS — Z7902 Long term (current) use of antithrombotics/antiplatelets: Secondary | ICD-10-CM | POA: Insufficient documentation

## 2023-04-20 DIAGNOSIS — D72829 Elevated white blood cell count, unspecified: Secondary | ICD-10-CM | POA: Diagnosis present

## 2023-04-20 DIAGNOSIS — E785 Hyperlipidemia, unspecified: Secondary | ICD-10-CM | POA: Diagnosis not present

## 2023-04-20 DIAGNOSIS — I5032 Chronic diastolic (congestive) heart failure: Secondary | ICD-10-CM

## 2023-04-20 DIAGNOSIS — E860 Dehydration: Secondary | ICD-10-CM | POA: Insufficient documentation

## 2023-04-20 DIAGNOSIS — Z96652 Presence of left artificial knee joint: Secondary | ICD-10-CM | POA: Diagnosis not present

## 2023-04-20 DIAGNOSIS — I482 Chronic atrial fibrillation, unspecified: Secondary | ICD-10-CM

## 2023-04-20 DIAGNOSIS — E1142 Type 2 diabetes mellitus with diabetic polyneuropathy: Secondary | ICD-10-CM | POA: Diagnosis present

## 2023-04-20 DIAGNOSIS — E1169 Type 2 diabetes mellitus with other specified complication: Secondary | ICD-10-CM | POA: Diagnosis not present

## 2023-04-20 DIAGNOSIS — E1140 Type 2 diabetes mellitus with diabetic neuropathy, unspecified: Secondary | ICD-10-CM | POA: Insufficient documentation

## 2023-04-20 DIAGNOSIS — Z85828 Personal history of other malignant neoplasm of skin: Secondary | ICD-10-CM | POA: Diagnosis not present

## 2023-04-20 DIAGNOSIS — I2510 Atherosclerotic heart disease of native coronary artery without angina pectoris: Secondary | ICD-10-CM | POA: Diagnosis present

## 2023-04-20 DIAGNOSIS — F0280 Dementia in other diseases classified elsewhere without behavioral disturbance: Secondary | ICD-10-CM | POA: Diagnosis not present

## 2023-04-20 DIAGNOSIS — I959 Hypotension, unspecified: Secondary | ICD-10-CM | POA: Diagnosis not present

## 2023-04-20 DIAGNOSIS — I110 Hypertensive heart disease with heart failure: Secondary | ICD-10-CM | POA: Insufficient documentation

## 2023-04-20 DIAGNOSIS — R531 Weakness: Secondary | ICD-10-CM | POA: Diagnosis not present

## 2023-04-20 DIAGNOSIS — R739 Hyperglycemia, unspecified: Secondary | ICD-10-CM | POA: Diagnosis not present

## 2023-04-20 DIAGNOSIS — K219 Gastro-esophageal reflux disease without esophagitis: Secondary | ICD-10-CM

## 2023-04-20 DIAGNOSIS — Z7901 Long term (current) use of anticoagulants: Secondary | ICD-10-CM | POA: Insufficient documentation

## 2023-04-20 DIAGNOSIS — Z7984 Long term (current) use of oral hypoglycemic drugs: Secondary | ICD-10-CM | POA: Diagnosis not present

## 2023-04-20 DIAGNOSIS — Z79899 Other long term (current) drug therapy: Secondary | ICD-10-CM | POA: Diagnosis not present

## 2023-04-20 DIAGNOSIS — N179 Acute kidney failure, unspecified: Secondary | ICD-10-CM | POA: Diagnosis not present

## 2023-04-20 DIAGNOSIS — R110 Nausea: Secondary | ICD-10-CM | POA: Diagnosis not present

## 2023-04-20 DIAGNOSIS — R55 Syncope and collapse: Secondary | ICD-10-CM

## 2023-04-20 DIAGNOSIS — W19XXXA Unspecified fall, initial encounter: Secondary | ICD-10-CM | POA: Diagnosis not present

## 2023-04-20 DIAGNOSIS — I951 Orthostatic hypotension: Secondary | ICD-10-CM | POA: Insufficient documentation

## 2023-04-20 DIAGNOSIS — R42 Dizziness and giddiness: Secondary | ICD-10-CM | POA: Diagnosis not present

## 2023-04-20 DIAGNOSIS — E039 Hypothyroidism, unspecified: Secondary | ICD-10-CM | POA: Diagnosis not present

## 2023-04-20 LAB — CBC
HCT: 49.9 % — ABNORMAL HIGH (ref 36.0–46.0)
Hemoglobin: 15.7 g/dL — ABNORMAL HIGH (ref 12.0–15.0)
MCH: 25.6 pg — ABNORMAL LOW (ref 26.0–34.0)
MCHC: 31.5 g/dL (ref 30.0–36.0)
MCV: 81.3 fL (ref 80.0–100.0)
Platelets: 397 10*3/uL (ref 150–400)
RBC: 6.14 MIL/uL — ABNORMAL HIGH (ref 3.87–5.11)
RDW: 16.9 % — ABNORMAL HIGH (ref 11.5–15.5)
WBC: 13.5 10*3/uL — ABNORMAL HIGH (ref 4.0–10.5)
nRBC: 0 % (ref 0.0–0.2)

## 2023-04-20 LAB — URINALYSIS, ROUTINE W REFLEX MICROSCOPIC
Bacteria, UA: NONE SEEN
Bilirubin Urine: NEGATIVE
Glucose, UA: >=500 mg/dL — AB
Ketones, ur: NEGATIVE mg/dL
Nitrite: NEGATIVE
Protein, ur: NEGATIVE mg/dL
Specific Gravity, Urine: 1.026 (ref 1.005–1.030)
pH: 5 (ref 5.0–8.0)

## 2023-04-20 LAB — COMPREHENSIVE METABOLIC PANEL
ALT: 20 U/L (ref 0–44)
AST: 25 U/L (ref 15–41)
Albumin: 3.8 g/dL (ref 3.5–5.0)
Alkaline Phosphatase: 120 U/L (ref 38–126)
Anion gap: 14 (ref 5–15)
BUN: 44 mg/dL — ABNORMAL HIGH (ref 8–23)
CO2: 23 mmol/L (ref 22–32)
Calcium: 8.8 mg/dL — ABNORMAL LOW (ref 8.9–10.3)
Chloride: 96 mmol/L — ABNORMAL LOW (ref 98–111)
Creatinine, Ser: 1.13 mg/dL — ABNORMAL HIGH (ref 0.44–1.00)
GFR, Estimated: 47 mL/min — ABNORMAL LOW (ref >60)
Glucose, Bld: 303 mg/dL — ABNORMAL HIGH (ref 70–99)
Potassium: 3.3 mmol/L — ABNORMAL LOW (ref 3.5–5.1)
Sodium: 133 mmol/L — ABNORMAL LOW (ref 135–145)
Total Bilirubin: 0.8 mg/dL (ref 0.3–1.2)
Total Protein: 7 g/dL (ref 6.5–8.1)

## 2023-04-20 LAB — LIPASE, BLOOD: Lipase: 34 U/L (ref 11–51)

## 2023-04-20 LAB — BETA-HYDROXYBUTYRIC ACID: Beta-Hydroxybutyric Acid: 0.14 mmol/L (ref 0.05–0.27)

## 2023-04-20 LAB — BLOOD GAS, VENOUS
Acid-Base Excess: 3.1 mmol/L — ABNORMAL HIGH (ref 0.0–2.0)
Bicarbonate: 27 mmol/L (ref 20.0–28.0)
O2 Saturation: 74.5 %
Patient temperature: 37
pCO2, Ven: 38 mmHg — ABNORMAL LOW (ref 44–60)
pH, Ven: 7.46 — ABNORMAL HIGH (ref 7.25–7.43)
pO2, Ven: 42 mmHg (ref 32–45)

## 2023-04-20 LAB — GLUCOSE, CAPILLARY
Glucose-Capillary: 169 mg/dL — ABNORMAL HIGH (ref 70–99)
Glucose-Capillary: 95 mg/dL (ref 70–99)

## 2023-04-20 LAB — BRAIN NATRIURETIC PEPTIDE: B Natriuretic Peptide: 85.6 pg/mL (ref 0.0–100.0)

## 2023-04-20 LAB — CK: Total CK: 24 U/L — ABNORMAL LOW (ref 38–234)

## 2023-04-20 LAB — TROPONIN I (HIGH SENSITIVITY): Troponin I (High Sensitivity): 10 ng/L (ref <18)

## 2023-04-20 LAB — MAGNESIUM: Magnesium: 2.6 mg/dL — ABNORMAL HIGH (ref 1.7–2.4)

## 2023-04-20 MED ORDER — INSULIN ASPART 100 UNIT/ML IJ SOLN
0.0000 [IU] | Freq: Three times a day (TID) | INTRAMUSCULAR | Status: DC
  Administered 2023-04-20: 2 [IU] via SUBCUTANEOUS
  Filled 2023-04-20: qty 1

## 2023-04-20 MED ORDER — ONDANSETRON HCL 4 MG PO TABS: 4.0000 mg | ORAL_TABLET | Freq: Four times a day (QID) | ORAL | Status: DC | PRN

## 2023-04-20 MED ORDER — ENOXAPARIN SODIUM 40 MG/0.4ML IJ SOSY
40.0000 mg | PREFILLED_SYRINGE | INTRAMUSCULAR | Status: DC
Start: 2023-04-20 — End: 2023-04-20

## 2023-04-20 MED ORDER — APIXABAN 5 MG PO TABS
5.0000 mg | ORAL_TABLET | Freq: Two times a day (BID) | ORAL | Status: DC
  Administered 2023-04-20 – 2023-04-21 (×3): 5 mg via ORAL
  Filled 2023-04-20 (×3): qty 1

## 2023-04-20 MED ORDER — SODIUM CHLORIDE 0.9 % IV SOLN: INTRAVENOUS | Status: DC

## 2023-04-20 MED ORDER — SODIUM CHLORIDE 0.9 % IV BOLUS
1000.0000 mL | Freq: Once | INTRAVENOUS | Status: AC
  Administered 2023-04-20: 1000 mL via INTRAVENOUS

## 2023-04-20 MED ORDER — INSULIN ASPART 100 UNIT/ML IJ SOLN
4.0000 [IU] | Freq: Three times a day (TID) | INTRAMUSCULAR | Status: DC
  Administered 2023-04-20 – 2023-04-21 (×2): 4 [IU] via SUBCUTANEOUS
  Filled 2023-04-20 (×2): qty 1

## 2023-04-20 MED ORDER — ALBUTEROL SULFATE (2.5 MG/3ML) 0.083% IN NEBU: 2.5000 mg | INHALATION_SOLUTION | Freq: Four times a day (QID) | RESPIRATORY_TRACT | Status: DC | PRN

## 2023-04-20 MED ORDER — AMIODARONE HCL 200 MG PO TABS
100.0000 mg | ORAL_TABLET | Freq: Every day | ORAL | Status: DC
  Administered 2023-04-21: 100 mg via ORAL
  Filled 2023-04-20 (×2): qty 1

## 2023-04-20 MED ORDER — SODIUM CHLORIDE 0.9% FLUSH
3.0000 mL | Freq: Two times a day (BID) | INTRAVENOUS | Status: DC
  Administered 2023-04-20 – 2023-04-21 (×3): 3 mL via INTRAVENOUS

## 2023-04-20 MED ORDER — ONDANSETRON HCL 4 MG/2ML IJ SOLN: 4.0000 mg | Freq: Four times a day (QID) | INTRAMUSCULAR | Status: DC | PRN

## 2023-04-20 MED ORDER — POTASSIUM CHLORIDE CRYS ER 20 MEQ PO TBCR
40.0000 meq | EXTENDED_RELEASE_TABLET | Freq: Once | ORAL | Status: AC
  Administered 2023-04-20: 40 meq via ORAL
  Filled 2023-04-20: qty 2

## 2023-04-20 MED ORDER — CLOPIDOGREL BISULFATE 75 MG PO TABS
75.0000 mg | ORAL_TABLET | Freq: Every day | ORAL | Status: DC
  Administered 2023-04-20 – 2023-04-21 (×2): 75 mg via ORAL
  Filled 2023-04-20 (×2): qty 1

## 2023-04-20 MED ORDER — VENLAFAXINE HCL ER 75 MG PO CP24
75.0000 mg | ORAL_CAPSULE | Freq: Every day | ORAL | Status: DC
  Administered 2023-04-20 – 2023-04-21 (×2): 75 mg via ORAL
  Filled 2023-04-20 (×2): qty 1

## 2023-04-20 MED ORDER — INSULIN ASPART 100 UNIT/ML IJ SOLN: 0.0000 [IU] | Freq: Every day | INTRAMUSCULAR | Status: DC

## 2023-04-20 MED ORDER — PRAVASTATIN SODIUM 20 MG PO TABS
20.0000 mg | ORAL_TABLET | Freq: Every day | ORAL | Status: DC
  Administered 2023-04-20: 20 mg via ORAL
  Filled 2023-04-20: qty 1

## 2023-04-20 MED ORDER — DONEPEZIL HCL 5 MG PO TABS
10.0000 mg | ORAL_TABLET | Freq: Every day | ORAL | Status: DC
  Administered 2023-04-20: 10 mg via ORAL
  Filled 2023-04-20: qty 2

## 2023-04-20 MED ORDER — LEVOTHYROXINE SODIUM 88 MCG PO TABS
88.0000 ug | ORAL_TABLET | Freq: Every day | ORAL | Status: DC
  Administered 2023-04-20 – 2023-04-21 (×2): 88 ug via ORAL
  Filled 2023-04-20 (×2): qty 1

## 2023-04-20 NOTE — Assessment & Plan Note (Signed)
Continue statin. 

## 2023-04-20 NOTE — Assessment & Plan Note (Signed)
White count 13.5 on presentation Looks to be fairly chronic issue No overt source of infection at present Does appear to have some degree of hemoconcentration in the setting of dehydration Will monitor for now Reassess if patient spikes a fever or acute declines clinically

## 2023-04-20 NOTE — ED Triage Notes (Signed)
Pt arrives via [facility] from [facility] for increasing weakness and orthostatic vital changes over the last few days. Pt denies syncopal episodes, no SOB, CP. Pt states that she was evaluated last night by nurse at facility and had pos orthostatic changes. During triage pt had BP 155/107 laying and 126/93 sitting with EDP at bedside.

## 2023-04-20 NOTE — ED Provider Notes (Signed)
[facility] Provider Note    Event Date/Time   First MD Initiated Contact with Patient 04/20/23 [phone]     (approximate)   History   Weakness   HPI  Alison Richardson is a 89 y.o. female past medical history hypertension, diabetes, congestive heart failure   Patient reports that she has been feeling very lightheaded to the point as though she is about to pass out whenever she gets up out of bed or stands up for about the last 3 days.  Its gotten worse though.  To the point now that she has to have a commode next to her bed, and if she sits up she just feels lightheaded.  Not a spinning sensation.  No numbness no tingling no weakness no speech changes.  No chest pain no trouble breathing no fevers or chills.  No pain or burning with urination.  She otherwise feels well, but does reports that anytime she is up trying to get about she will feel very lightheaded  She relates a similar feeling a couple months ago where she did actually have an episode where she passed out and just cannot find herself on the floor.  She reports she did not notify her doctor or seek medical care when she passed out a month ago or maybe a little longer  She has not had any falls where she struck her head, she is not actually passed out anytime in the last month.  When she gets lightheaded the last few days she has been able to lower herself or back into bed.  She says as long as she lays in bed she feels perfectly fine but when she is up to try to move about she will get very lightheaded  Physical Exam   Triage Vital Signs: ED Triage Vitals  Enc Vitals Group     BP 04/20/23 0855 (!) 155/107     Pulse Rate 04/20/23 0855 92     Resp 04/20/23 0855 14     Temp 04/20/23 0855 97.6 F (36.4 C)     Temp src --      SpO2 04/20/23 0855 100 %     Weight --      Height --      Head Circumference --      Peak Flow --      Pain Score 04/20/23 0856 0     Pain Loc --      Pain Edu? --       Excl. in GC? --     Most recent vital signs: Vitals:   04/20/23 0900 04/20/23 0930  BP: (!) 143/81 121/70  Pulse: 85 89  Resp: 16 12  Temp:    SpO2: 98% 97%     General: Awake, no distress.  Very pleasant.  No distress.  Fully oriented conversant CV:  Good peripheral perfusion.  Normal tones and rate. Resp:  Normal effort.  Clear lungs bilaterally Abd:  No distention.  Soft nontender nondistended Other:  Moves all extremities well without deficit.  Cranial nerves grossly intact.  Speech is clear.  No facial droop.  Normocephalic atraumatic no cervical tenderness   ED Results / Procedures / Treatments   Labs (all labs ordered are listed, but only abnormal results are displayed) Labs Reviewed  CBC - Abnormal; Notable for the following components:      Result Value   WBC 13.5 (*)    RBC 6.14 (*)    Hemoglobin 15.7 (*)  HCT 49.9 (*)    MCH 25.6 (*)    RDW 16.9 (*)    All other components within normal limits  COMPREHENSIVE METABOLIC PANEL - Abnormal; Notable for the following components:   Sodium 133 (*)    Potassium 3.3 (*)    Chloride 96 (*)    Glucose, Bld 303 (*)    BUN 44 (*)    Creatinine, Ser 1.13 (*)    Calcium 8.8 (*)    GFR, Estimated 47 (*)    All other components within normal limits  BLOOD GAS, VENOUS - Abnormal; Notable for the following components:   pH, Ven 7.46 (*)    pCO2, Ven 38 (*)    Acid-Base Excess 3.1 (*)    All other components within normal limits  URINALYSIS, ROUTINE W REFLEX MICROSCOPIC - Abnormal; Notable for the following components:   Color, Urine YELLOW (*)    APPearance HAZY (*)    Glucose, UA >=500 (*)    Hgb urine dipstick SMALL (*)    Leukocytes,Ua TRACE (*)    All other components within normal limits  CK - Abnormal; Notable for the following components:   Total CK 24 (*)    All other components within normal limits  LIPASE, BLOOD  BETA-HYDROXYBUTYRIC ACID  BRAIN NATRIURETIC PEPTIDE  TROPONIN I (HIGH SENSITIVITY)      EKG  And interpreted by me at 9 AM heart rate 90 QRS 110 QTc 450 Atrial fibrillation, no evidence of acute ischemia.    RADIOLOGY  Chest x-ray interpreted by me as normal  DG Chest Portable 1 View  Result Date: 04/20/2023 CLINICAL DATA:  Near syncope. EXAM: PORTABLE CHEST 1 VIEW COMPARISON:  12/09/2022 FINDINGS: Lungs are hyperexpanded. The lungs are clear without focal pneumonia, edema, pneumothorax or pleural effusion. Streaky atelectasis or scarring noted medial left base. Interstitial markings are diffusely coarsened with chronic features. Cardiopericardial silhouette is at upper limits of normal for size. No acute bony abnormality. Telemetry leads overlie the chest. IMPRESSION: No active disease. Electronically Signed   By: Kennith Center M.D.   On: 04/20/2023 09:28       PROCEDURES:  Critical Care performed: No  Procedures   MEDICATIONS ORDERED IN ED: Medications  sodium chloride 0.9 % bolus 1,000 mL (1,000 mLs Intravenous New Bag/Given 04/20/23 0916)     IMPRESSION / MDM / ASSESSMENT AND PLAN / ED COURSE  I reviewed the triage vital signs and the nursing notes.                              Differential diagnosis includes, but is not limited to, orthostatic hypotension, dehydration, evaluate for possible anemia, metabolic abnormality arrhythmia given known history of A-fib, etc.  She has not had any head injury or significant trauma in the last month, but she does report that she passed out about maybe 2 months ago with similar symptoms.  At this point I think her symptoms to be treated as though near-syncope or possible syncope.  Seems very orthostatic and she does show orthostatic hypotension here on testing  Labs demonstrate elevated white count and red count, concerning for some hemoconcentration possibly dehydration her BUN is elevated as well slight increase in her creatinine.  Will hydrate.  She is alert oriented neurovascularly intact.  Chest x-ray clear.   Urinalysis without any clear signs of infection.  Discussed with patient and given her symptoms, I believe that she would benefit from admission  and further workup under the hospitalist service especially given the degree and reproducibility and severity of her symptoms last 3 days.  Patient understand agreeable with this plan  Patient's presentation is most consistent with acute complicated illness / injury requiring diagnostic workup.   The patient is on the cardiac monitor to evaluate for evidence of arrhythmia and/or significant heart rate changes.   Consulted with, and patient accepted to the hospital service by Dr. Alfred Levins     FINAL CLINICAL IMPRESSION(S) / ED DIAGNOSES   Final diagnoses:  Generalized weakness  Orthostatic hypotension  AKI (acute kidney injury) (HCC)     Rx / DC Orders   ED Discharge Orders     None        Note:  This document was prepared using Dragon voice recognition software and may include unintentional dictation errors.   Sharyn Creamer, MD 04/20/23 1048

## 2023-04-20 NOTE — ED Notes (Signed)
ED TO INPATIENT HANDOFF REPORT  ED Nurse Name and Phone #: Virgilio Belling Name/Age/Gender Alison Richardson 89 y.o. female Room/Bed: ED11A/ED11A  Code Status   Code Status: Full Code  Home/SNF/Other Nursing Home Patient oriented to: self, place, time, and situation Is this baseline? Yes   Triage Complete: Triage complete  Chief Complaint Syncope [R55]  Triage Note Pt arrives via [facility] from [facility] for increasing weakness and orthostatic vital changes over the last few days. Pt denies syncopal episodes, no SOB, CP. Pt states that she was evaluated last night by nurse at facility and had pos orthostatic changes. During triage pt had BP 155/107 laying and 126/93 sitting with EDP at bedside.    Allergies Allergies  Allergen Reactions   Pantoprazole Nausea And Vomiting   Metformin And Related Diarrhea    Level of Care/Admitting Diagnosis ED Disposition     ED Disposition  Admit   Condition  --   Comment  Hospital Area: [facility] [[id number]]  Level of Care: Telemetry Medical [104]  Covid Evaluation: Confirmed COVID Negative  Diagnosis: Syncope [206001]  Admitting Physician: Floydene Flock [[id number]]  Attending Physician: Floydene Flock [[id number]]          B Medical/Surgery History Past Medical History:  Diagnosis Date   Arrhythmia    Atrial flutter (HCC) 01/2018   new onset    Basal cell carcinoma of back    Basal cell carcinoma of lip    Cerebral aneurysm    followed by [facility]   CHF (congestive heart failure) (HCC)    DDD (degenerative disc disease), lumbar    superior plate depression, L3 08/18/2014   Diabetes mellitus type II, controlled (HCC)    Diverticulosis    Dysrhythmia    Paroxysmal Supraventricular Tachycardia   Dysthymia    depression   GERD (gastroesophageal reflux disease)    History of meniscal tear    Hyperlipidemia    Hypertension    Hypothyroidism    Late onset Alzheimer's disease with behavioral  disturbance (HCC)    Mild pulmonary hypertension (HCC)    Overactive bladder    Peripheral vascular disease (HCC)    Seasonal allergic rhinitis    Sleep apnea    Past Surgical History:  Procedure Laterality Date   APPENDECTOMY  1946   BASAL CELL CARCINOMA EXCISION     2006 and 2009 removed from back and lip   CARDIOVASCULAR STRESS TEST  2015   nuclear cardiac stress test negative for ischemia - Dr. Tonita Phoenix   CATARACT EXTRACTION, BILATERAL  2006   COLONOSCOPY  2014   COLONOSCOPY N/A 08/19/2020   Procedure: COLONOSCOPY;  Surgeon: Regis Bill, MD;  Location: [facility] ENDOSCOPY;  Service: Endoscopy;  Laterality: N/A;   ECTOPIC PREGNANCY SURGERY  1957   ESOPHAGOGASTRODUODENOSCOPY N/A 08/19/2020   Procedure: ESOPHAGOGASTRODUODENOSCOPY (EGD);  Surgeon: Regis Bill, MD;  Location: [facility] ENDOSCOPY;  Service: Endoscopy;  Laterality: N/A;   INJECTION KNEE Left 05/2015   LEFT HEART CATH AND CORONARY ANGIOGRAPHY N/A 10/12/2021   Procedure: LEFT HEART CATH AND CORONARY ANGIOGRAPHY;  Surgeon: Lamar Blinks, MD;  Location: [facility] INVASIVE CV LAB;  Service: Cardiovascular;  Laterality: N/A;   REPLACEMENT TOTAL KNEE Left 09/06/2016   Dr. Suzette Battiest   TONSILLECTOMY AND ADENOIDECTOMY  1953   TOTAL ABDOMINAL HYSTERECTOMY  1986   DU B     A IV Location/Drains/Wounds Patient Lines/Drains/Airways Status     Active Line/Drains/Airways     Name  Placement date Placement time Site Days   Peripheral IV 04/20/23 22 G Anterior;Left Forearm 04/20/23  0858  Forearm  less than 1   External Urinary Catheter 04/20/23  0918  --  less than 1            Intake/Output Last 24 hours No intake or output data in the 24 hours ending 04/20/23 1335  Labs/Imaging Results for orders placed or performed during the hospital encounter of 04/20/23 (from the past 48 hour(s))  Blood gas, venous     Status: Abnormal   Collection Time: 04/20/23  9:00 AM  Result Value Ref Range   pH, Ven 7.46 (H) 7.25 - 7.43    pCO2, Ven 38 (L) 44 - 60 mmHg   pO2, Ven 42 32 - 45 mmHg   Bicarbonate 27.0 20.0 - 28.0 mmol/L   Acid-Base Excess 3.1 (H) 0.0 - 2.0 mmol/L   O2 Saturation 74.5 %   Patient temperature 37.0    Collection site VEIN     Comment: Performed at [facility], [address] [place], [place] [address]  CBC     Status: Abnormal   Collection Time: 04/20/23  9:09 AM  Result Value Ref Range   WBC 13.5 (H) 4.0 - 10.5 K/uL   RBC 6.14 (H) 3.87 - 5.11 MIL/uL   Hemoglobin 15.7 (H) 12.0 - 15.0 g/dL   HCT 19.1 (H) 47.8 - 29.5 %   MCV 81.3 80.0 - 100.0 fL   MCH 25.6 (L) 26.0 - 34.0 pg   MCHC 31.5 30.0 - 36.0 g/dL   RDW 62.1 (H) 30.8 - 65.7 %   Platelets 397 150 - 400 K/uL   nRBC 0.0 0.0 - 0.2 %    Comment: Performed at [facility], [address] [place], [place] [address]  Comprehensive metabolic panel     Status: Abnormal   Collection Time: 04/20/23  9:09 AM  Result Value Ref Range   Sodium 133 (L) 135 - 145 mmol/L   Potassium 3.3 (L) 3.5 - 5.1 mmol/L   Chloride 96 (L) 98 - 111 mmol/L   CO2 23 22 - 32 mmol/L   Glucose, Bld 303 (H) 70 - 99 mg/dL    Comment: Glucose reference range applies only to samples taken after fasting for at least 8 hours.   BUN 44 (H) 8 - 23 mg/dL   Creatinine, Ser 2.95 (H) 0.44 - 1.00 mg/dL   Calcium 8.8 (L) 8.9 - 10.3 mg/dL   Total Protein 7.0 6.5 - 8.1 g/dL   Albumin 3.8 3.5 - 5.0 g/dL   AST 25 15 - 41 U/L   ALT 20 0 - 44 U/L   Alkaline Phosphatase 120 38 - 126 U/L   Total Bilirubin 0.8 0.3 - 1.2 mg/dL   GFR, Estimated 47 (L) >60 mL/min    Comment: (NOTE) Calculated using the CKD-EPI Creatinine Equation (2021)    Anion gap 14 5 - 15    Comment: Performed at [facility], [address] [place], [place] [address]  Lipase, blood     Status: None   Collection Time: 04/20/23  9:09 AM  Result Value Ref Range   Lipase 34 11 - 51 U/L    Comment: Performed at [facility], [address] [place], [place] [address]   Troponin I (High Sensitivity)     Status: None   Collection Time: 04/20/23  9:09 AM  Result Value Ref Range   Troponin I (High Sensitivity) 10 <  18 ng/L    Comment: (NOTE) Elevated high sensitivity troponin I (hsTnI) values and significant  changes across serial measurements may suggest ACS but many other  chronic and acute conditions are known to elevate hsTnI results.  Refer to the "Links" section for chest pain algorithms and additional  guidance. Performed at [facility], [address] [place], [place] [address]   Beta-hydroxybutyric acid     Status: None   Collection Time: 04/20/23  9:09 AM  Result Value Ref Range   Beta-Hydroxybutyric Acid 0.14 0.05 - 0.27 mmol/L    Comment: Performed at [facility], [address] [place], [place] [address]  Urinalysis, Routine w reflex microscopic -Urine, Clean Catch     Status: Abnormal   Collection Time: 04/20/23  9:09 AM  Result Value Ref Range   Color, Urine YELLOW (A) YELLOW   APPearance HAZY (A) CLEAR   Specific Gravity, Urine 1.026 1.005 - 1.030   pH 5.0 5.0 - 8.0   Glucose, UA >=500 (A) NEGATIVE mg/dL   Hgb urine dipstick SMALL (A) NEGATIVE   Bilirubin Urine NEGATIVE NEGATIVE   Ketones, ur NEGATIVE NEGATIVE mg/dL   Protein, ur NEGATIVE NEGATIVE mg/dL   Nitrite NEGATIVE NEGATIVE   Leukocytes,Ua TRACE (A) NEGATIVE   RBC / HPF 6-10 0 - 5 RBC/hpf   WBC, UA 0-5 0 - 5 WBC/hpf   Bacteria, UA NONE SEEN NONE SEEN   Squamous Epithelial / HPF 0-5 0 - 5 /HPF    Comment: Performed at [facility], [address] [place], [place] [address]  Brain natriuretic peptide     Status: None   Collection Time: 04/20/23  9:09 AM  Result Value Ref Range   B Natriuretic Peptide 85.6 0.0 - 100.0 pg/mL    Comment: Performed at [facility], [address] [place], [place] [address]  CK     Status: Abnormal   Collection Time: 04/20/23  9:09 AM  Result Value Ref Range   Total CK 24 (L) 38 - 234 U/L     Comment: Performed at [facility], [address] [place], [place] [address]  Magnesium     Status: Abnormal   Collection Time: 04/20/23  9:09 AM  Result Value Ref Range   Magnesium 2.6 (H) 1.7 - 2.4 mg/dL    Comment: Performed at [facility], [address], [place], [place] [address]   DG Chest Portable 1 View  Result Date: 04/20/2023 CLINICAL DATA:  Near syncope. EXAM: PORTABLE CHEST 1 VIEW COMPARISON:  12/09/2022 FINDINGS: Lungs are hyperexpanded. The lungs are clear without focal pneumonia, edema, pneumothorax or pleural effusion. Streaky atelectasis or scarring noted medial left base. Interstitial markings are diffusely coarsened with chronic features. Cardiopericardial silhouette is at upper limits of normal for size. No acute bony abnormality. Telemetry leads overlie the chest. IMPRESSION: No active disease. Electronically Signed   By: Kennith Center M.D.   On: 04/20/2023 09:28    Pending Labs Unresulted Labs (From admission, onward)     Start     Ordered   04/21/23 0500  CBC  Tomorrow morning,   R        04/20/23 1225   04/21/23 0500  Comprehensive metabolic panel  Tomorrow morning,   R        04/20/23 1225            Vitals/Pain Today's Vitals   04/20/23 1100 04/20/23 1130 04/20/23 1200 04/20/23 1230  BP: 100/61 117/75 139/76 Marland Kitchen)  108/94  Pulse: 70 85 98 91  Resp: 17 19 17 19   Temp:      SpO2: 97% 96% 98% 97%  PainSc:        Isolation Precautions No active isolations  Medications Medications  sodium chloride flush (NS) 0.9 % injection 3 mL (has no administration in time range)  0.9 %  sodium chloride infusion (has no administration in time range)  ondansetron (ZOFRAN) tablet 4 mg (has no administration in time range)    Or  ondansetron (ZOFRAN) injection 4 mg (has no administration in time range)  amiodarone (PACERONE) tablet 100 mg (has no administration in time range)  apixaban (ELIQUIS) tablet 5 mg (has no administration in time range)   donepezil (ARICEPT) tablet 10 mg (has no administration in time range)  levothyroxine (SYNTHROID) tablet 88 mcg (has no administration in time range)  pravastatin (PRAVACHOL) tablet 20 mg (has no administration in time range)  venlafaxine XR (EFFEXOR-XR) 24 hr capsule 75 mg (has no administration in time range)  albuterol (PROVENTIL) (2.5 MG/3ML) 0.083% nebulizer solution 2.5 mg (has no administration in time range)  insulin aspart (novoLOG) injection 0-9 Units (has no administration in time range)  insulin aspart (novoLOG) injection 0-5 Units (has no administration in time range)  insulin aspart (novoLOG) injection 4 Units (has no administration in time range)  clopidogrel (PLAVIX) tablet 75 mg (has no administration in time range)  potassium chloride SA (KLOR-CON M) CR tablet 40 mEq (has no administration in time range)  sodium chloride 0.9 % bolus 1,000 mL (1,000 mLs Intravenous New Bag/Given 04/20/23 0916)    Mobility walks with device     Focused Assessments     R Recommendations: See Admitting Provider Note  Report given to:   Additional Notes:   Orthostatic Pos changes

## 2023-04-20 NOTE — Assessment & Plan Note (Signed)
Positive recurrent orthostatic syncope as patient has recurring symptoms associated go from sitting to standing Clinically dry Will hold offending agents including torsemide, spironolactone and diltiazem for now IV fluid hydration Check CT head and 2D echo PT OT evaluation Follow 

## 2023-04-20 NOTE — Assessment & Plan Note (Signed)
PPI ?

## 2023-04-20 NOTE — Assessment & Plan Note (Signed)
Rate controlled atrial fibrillation on EKG Continue amiodarone and Eliquis Monitor

## 2023-04-20 NOTE — H&P (Signed)
History and Physical    Patient: Alison Richardson ZOX:[id number] DOB: [birth year] DOA: 04/20/2023 DOS: the patient was seen and examined on 04/20/2023 PCP: Larae Grooms, NP  Patient coming from: Home  Chief Complaint:  Chief Complaint  Patient presents with   Weakness   HPI: Alison Richardson is a 89 y.o. female with medical history significant of atrial fibrillation on Eliquis, diastolic CHF, GERD, hypothyroidism, hyperlipidemia, dementia presenting with near-syncope.  Patient ports recurring episodes of significant dizziness.  Patient states she has had repeated episodes of going from sitting to standing with significant weakness and dizziness.  Symptoms do not occur when she is laying down.  No hemiparesis or confusion.  No slurred speech.  No chest pain or shortness of breath.  Patient with noted evaluation recently at her PCPs office with concern for possible dehydration.  Patient states she has had increased p.o. intake though she has been consistent with her diuretic use.  No fevers or chills.  No nausea or vomiting. Presented to the ER afebrile, blood pressure going from sitting to standing SBP 1 55-1 08.  White count 13.5, hemoglobin 15.7, creatinine 1.13, glucose 303.  EKG atrial fibrillation with heart rate in the low 100s.  Chest x-ray stable. Review of Systems: As mentioned in the history of present illness. All other systems reviewed and are negative. Past Medical History:  Diagnosis Date   Arrhythmia    Atrial flutter (HCC) 01/2018   new onset    Basal cell carcinoma of back    Basal cell carcinoma of lip    Cerebral aneurysm    followed by [facility]   CHF (congestive heart failure) (HCC)    DDD (degenerative disc disease), lumbar    superior plate depression, L3 08/18/2014   Diabetes mellitus type II, controlled (HCC)    Diverticulosis    Dysrhythmia    Paroxysmal Supraventricular Tachycardia   Dysthymia    depression   GERD (gastroesophageal reflux disease)    History of  meniscal tear    Hyperlipidemia    Hypertension    Hypothyroidism    Late onset Alzheimer's disease with behavioral disturbance (HCC)    Mild pulmonary hypertension (HCC)    Overactive bladder    Peripheral vascular disease (HCC)    Seasonal allergic rhinitis    Sleep apnea    Past Surgical History:  Procedure Laterality Date   APPENDECTOMY  1946   BASAL CELL CARCINOMA EXCISION     2006 and 2009 removed from back and lip   CARDIOVASCULAR STRESS TEST  2015   nuclear cardiac stress test negative for ischemia - Dr. Tonita Phoenix   CATARACT EXTRACTION, BILATERAL  2006   COLONOSCOPY  2014   COLONOSCOPY N/A 08/19/2020   Procedure: COLONOSCOPY;  Surgeon: Regis Bill, MD;  Location: [facility] ENDOSCOPY;  Service: Endoscopy;  Laterality: N/A;   ECTOPIC PREGNANCY SURGERY  1957   ESOPHAGOGASTRODUODENOSCOPY N/A 08/19/2020   Procedure: ESOPHAGOGASTRODUODENOSCOPY (EGD);  Surgeon: Regis Bill, MD;  Location: [facility] ENDOSCOPY;  Service: Endoscopy;  Laterality: N/A;   INJECTION KNEE Left 05/2015   LEFT HEART CATH AND CORONARY ANGIOGRAPHY N/A 10/12/2021   Procedure: LEFT HEART CATH AND CORONARY ANGIOGRAPHY;  Surgeon: Lamar Blinks, MD;  Location: [facility] INVASIVE CV LAB;  Service: Cardiovascular;  Laterality: N/A;   REPLACEMENT TOTAL KNEE Left 09/06/2016   Dr. Suzette Battiest   TONSILLECTOMY AND ADENOIDECTOMY  1953   TOTAL ABDOMINAL HYSTERECTOMY  1986   [facility]   Social History:  reports that she has never smoked.  She has never used smokeless tobacco. She reports that she does not currently use alcohol. She reports that she does not use drugs.  Allergies  Allergen Reactions   Pantoprazole Nausea And Vomiting   Metformin And Related Diarrhea    Family History  Problem Relation Age of Onset   Hypertension Mother    Heart disease Father    CAD Father    Heart disease Sister    Diabetes Sister    Diabetes Paternal Uncle    Sleep apnea Son     Prior to Admission medications   Medication Sig  Start Date End Date Taking? Authorizing Provider  albuterol (VENTOLIN HFA) 108 (90 Base) MCG/ACT inhaler Inhale 2 puffs into the lungs every 6 (six) hours as needed for wheezing or shortness of breath.   Yes [provider]  amiodarone (PACERONE) 200 MG tablet Take 100 mg by mouth daily. 01/02/19  Yes [provider]  apixaban (ELIQUIS) 5 MG TABS tablet Take 5 mg by mouth 2 (two) times daily.   Yes [provider]  clopidogrel (PLAVIX) 75 MG tablet Take 75 mg by mouth daily.   Yes [provider]  dapagliflozin propanediol (FARXIGA) 10 MG TABS tablet TAKE 1 TABLET BY MOUTH DAILY BEFORE BREAKFAST 03/05/23  Yes Hackney, Tina A, FNP  diltiazem (CARDIZEM CD) 240 MG 24 hr capsule Take 240 mg by mouth daily.   Yes [provider]  donepezil (ARICEPT) 10 MG tablet TAKE ONE TABLET (10 MG) BY MOUTH AT BEDTIME 04/02/23  Yes Larae Grooms, NP  famotidine (PEPCID) 20 MG tablet TAKE 1 TABLET BY MOUTH ONCE DAILY 04/02/23  Yes Larae Grooms, NP  gabapentin (NEURONTIN) 100 MG capsule TAKE 1 CAPSULE BY MOUTH DAILY 04/02/23  Yes Larae Grooms, NP  glipiZIDE (GLUCOTROL XL) 5 MG 24 hr tablet TAKE 1 TABLET BY MOUTH DAILY 03/06/23  Yes Larae Grooms, NP  isosorbide mononitrate (IMDUR) 30 MG 24 hr tablet Take 30 mg by mouth daily.   Yes [provider]  levothyroxine (SYNTHROID) 88 MCG tablet Take 1 tablet (88 mcg total) by mouth daily. 07/17/22  Yes Larae Grooms, NP  methocarbamol (ROBAXIN) 500 MG tablet Take 500 mg by mouth every 8 (eight) hours as needed for muscle spasms.   Yes [provider]  metoprolol succinate (TOPROL-XL) 100 MG 24 hr tablet TAKE ONE TABLET (100 MG) BY MOUTH EVERY DAY (TAKE WITH OR IMMEDIATELY AFTER A MEAL) 04/02/23  Yes Larae Grooms, NP  nitroGLYCERIN (NITROSTAT) 0.4 MG SL tablet Place 0.4 mg under the tongue every 5 (five) minutes as needed for chest pain.   Yes [provider]  pravastatin (PRAVACHOL) 20 MG  tablet Take 20 mg by mouth at bedtime.   Yes [provider]  spironolactone (ALDACTONE) 25 MG tablet Take 0.5 tablets (12.5 mg total) by mouth daily. 02/11/23  Yes Larae Grooms, NP  tiZANidine (ZANAFLEX) 2 MG tablet Take 2 mg by mouth every 8 (eight) hours as needed for muscle spasms.   Yes [provider]  torsemide (DEMADEX) 20 MG tablet Take 1 tablet (20 mg total) by mouth daily. 11/03/21  Yes Leeroy Bock, MD  traMADol (ULTRAM) 50 MG tablet Take 50 mg by mouth every 6 (six) hours as needed for moderate pain.   Yes [provider]  venlafaxine XR (EFFEXOR-XR) 75 MG 24 hr capsule TAKE 1 CAPSULE BY MOUTH ONCE DAILY. 04/02/23  Yes Larae Grooms, NP  desloratadine (CLARINEX) 5 MG tablet TAKE 1 TABLET BY MOUTH ONCE DAILY  Patient not taking: Reported on 04/09/2023 01/08/23   Raechel Chute, MD  fluticasone [facility]) 50 MCG/ACT nasal spray Place 1 spray into both nostrils daily. Patient not taking: Reported on 04/20/2023 11/28/22 11/28/23  Raechel Chute, MD    Physical Exam: Vitals:   04/20/23 1100 04/20/23 1130 04/20/23 1200 04/20/23 1230  BP: 100/61 117/75 139/76 (!) 108/94  Pulse: 70 85 98 91  Resp: 17 19 17 19   Temp:      SpO2: 97% 96% 98% 97%   Physical Exam Constitutional:      Appearance: She is normal weight.  HENT:     Head: Normocephalic and atraumatic.     Mouth/Throat:     Mouth: Mucous membranes are dry.  Eyes:     Pupils: Pupils are equal, round, and reactive to light.  Cardiovascular:     Rate and Rhythm: Normal rate and regular rhythm.  Pulmonary:     Effort: Pulmonary effort is normal.  Abdominal:     General: Bowel sounds are normal.  Musculoskeletal:        General: Normal range of motion.  Skin:    General: Skin is warm and dry.  Neurological:     General: No focal deficit present.  Psychiatric:        Mood and Affect: Mood normal.     Data Reviewed:  There are no new results to review at this time. DG Chest  Portable 1 View CLINICAL DATA:  Near syncope.  EXAM: PORTABLE CHEST 1 VIEW  COMPARISON:  12/09/2022  FINDINGS: Lungs are hyperexpanded. The lungs are clear without focal pneumonia, edema, pneumothorax or pleural effusion. Streaky atelectasis or scarring noted medial left base. Interstitial markings are diffusely coarsened with chronic features. Cardiopericardial silhouette is at upper limits of normal for size. No acute bony abnormality. Telemetry leads overlie the chest.  IMPRESSION: No active disease.  Electronically Signed   By: Kennith Center M.D.   On: 04/20/2023 09:28  Lab Results  Component Value Date   WBC 13.5 (H) 04/20/2023   HGB 15.7 (H) 04/20/2023   HCT 49.9 (H) 04/20/2023   MCV 81.3 04/20/2023   PLT 397 04/20/2023   Last metabolic panel Lab Results  Component Value Date   GLUCOSE 303 (H) 04/20/2023   NA 133 (L) 04/20/2023   K 3.3 (L) 04/20/2023   CL 96 (L) 04/20/2023   CO2 23 04/20/2023   BUN 44 (H) 04/20/2023   CREATININE 1.13 (H) 04/20/2023   GFRNONAA 47 (L) 04/20/2023   CALCIUM 8.8 (L) 04/20/2023   PROT 7.0 04/20/2023   ALBUMIN 3.8 04/20/2023   LABGLOB 2.6 04/16/2023   AGRATIO 1.8 04/16/2023   BILITOT 0.8 04/20/2023   ALKPHOS 120 04/20/2023   AST 25 04/20/2023   ALT 20 04/20/2023   ANIONGAP 14 04/20/2023    Assessment and Plan: Syncope Positive recurrent orthostatic syncope as patient has recurring symptoms associated go from sitting to standing Clinically dry Will hold offending agents including torsemide, spironolactone and diltiazem for now IV fluid hydration Check CT head and 2D echo PT OT evaluation Follow  Chronic diastolic CHF (congestive heart failure) (HCC) 2D echo 11/2022 with a EF of 50 to 55% Appears clinically dry Hold regimen for now in the setting of orthostatic syncope Titrate regimen Strict ins and outs and daily weights Follow  Hypothyroidism Continue Synthroid  Type 2 diabetes mellitus with peripheral  neuropathy (HCC) SSI A1c  Atrial fibrillation, chronic (HCC) Rate controlled atrial fibrillation on EKG Continue amiodarone and Eliquis Monitor  CAD (coronary artery disease) No active CP  Cont plavix   Gastroesophageal reflux disease PPI  Late onset Alzheimer's disease without behavioral disturbance (HCC) Continue Aricept  Leukocytosis White count 13.5 on presentation Looks to be fairly chronic issue No overt source of infection at present Does appear to have some degree of hemoconcentration in the setting of dehydration Will monitor for now Reassess if patient spikes a fever or acute declines clinically  Hyperlipidemia associated with type 2 diabetes mellitus (HCC) Continue statin      Advance Care Planning:   Code Status: Full Code   Consults: None   Family Communication: Daughter at the bedside   Severity of Illness: The appropriate patient status for this patient is OBSERVATION. Observation status is judged to be reasonable and necessary in order to provide the required intensity of service to ensure the patient's safety. The patient's presenting symptoms, physical exam findings, and initial radiographic and laboratory data in the context of their medical condition is felt to place them at decreased risk for further clinical deterioration. Furthermore, it is anticipated that the patient will be medically stable for discharge from the hospital within 2 midnights of admission.   Author: Floydene Flock, MD 04/20/2023 12:37 PM  For on call review [email or web address].

## 2023-04-20 NOTE — Assessment & Plan Note (Signed)
2D echo 11/2022 with a EF of 50 to 55% Appears clinically dry Hold regimen for now in the setting of orthostatic syncope Titrate regimen Strict ins and outs and daily weights Follow

## 2023-04-20 NOTE — Assessment & Plan Note (Signed)
No active CP  Cont plavix

## 2023-04-20 NOTE — Assessment & Plan Note (Signed)
SSI A1c 

## 2023-04-20 NOTE — Assessment & Plan Note (Signed)
Continue Synthroid °

## 2023-04-20 NOTE — Assessment & Plan Note (Signed)
Continue Aricept 

## 2023-04-20 NOTE — Assessment & Plan Note (Signed)
Positive recurrent orthostatic syncope as patient has recurring symptoms associated go from sitting to standing Clinically dry Will hold offending agents including torsemide, spironolactone and diltiazem for now IV fluid hydration Check CT head and 2D echo PT OT evaluation Follow

## 2023-04-21 ENCOUNTER — Observation Stay (HOSPITAL_BASED_OUTPATIENT_CLINIC_OR_DEPARTMENT_OTHER)
Admit: 2023-04-21 | Discharge: 2023-04-21 | Disposition: A | Discharge status: Home / Self Care | Payer: Medicare PPO | Attending: Family Medicine | Admitting: Family Medicine

## 2023-04-21 DIAGNOSIS — R55 Syncope and collapse: Secondary | ICD-10-CM | POA: Diagnosis not present

## 2023-04-21 DIAGNOSIS — E860 Dehydration: Secondary | ICD-10-CM

## 2023-04-21 LAB — COMPREHENSIVE METABOLIC PANEL
ALT: 14 U/L (ref 0–44)
AST: 17 U/L (ref 15–41)
Albumin: 2.9 g/dL — ABNORMAL LOW (ref 3.5–5.0)
Alkaline Phosphatase: 88 U/L (ref 38–126)
Anion gap: 7 (ref 5–15)
BUN: 26 mg/dL — ABNORMAL HIGH (ref 8–23)
CO2: 22 mmol/L (ref 22–32)
Calcium: 8.1 mg/dL — ABNORMAL LOW (ref 8.9–10.3)
Chloride: 110 mmol/L (ref 98–111)
Creatinine, Ser: 0.7 mg/dL (ref 0.44–1.00)
GFR, Estimated: >60 mL/min (ref >60)
Glucose, Bld: 106 mg/dL — ABNORMAL HIGH (ref 70–99)
Potassium: 3.7 mmol/L (ref 3.5–5.1)
Sodium: 139 mmol/L (ref 135–145)
Total Bilirubin: 0.7 mg/dL (ref 0.3–1.2)
Total Protein: 5.4 g/dL — ABNORMAL LOW (ref 6.5–8.1)

## 2023-04-21 LAB — GLUCOSE, CAPILLARY
Glucose-Capillary: 103 mg/dL — ABNORMAL HIGH (ref 70–99)
Glucose-Capillary: 95 mg/dL (ref 70–99)

## 2023-04-21 LAB — ECHOCARDIOGRAM COMPLETE
Area-P 1/2: 5.02 cm2
S' Lateral: 2.3 cm
Weight: 2631.41 oz

## 2023-04-21 LAB — CBC
HCT: 41.7 % (ref 36.0–46.0)
Hemoglobin: 13.1 g/dL (ref 12.0–15.0)
MCH: 25.7 pg — ABNORMAL LOW (ref 26.0–34.0)
MCHC: 31.4 g/dL (ref 30.0–36.0)
MCV: 81.8 fL (ref 80.0–100.0)
Platelets: 282 10*3/uL (ref 150–400)
RBC: 5.1 MIL/uL (ref 3.87–5.11)
RDW: 16.6 % — ABNORMAL HIGH (ref 11.5–15.5)
WBC: 11.2 10*3/uL — ABNORMAL HIGH (ref 4.0–10.5)
nRBC: 0 % (ref 0.0–0.2)

## 2023-04-21 NOTE — Evaluation (Signed)
Occupational Therapy Evaluation Patient Details Name: Alison Richardson MRN: [id number] DOB: May 29, 1933 Today's Date: 04/21/2023   History of Present Illness Pt is an 89 y/o F admitted on 04/20/23 after presenting with c/o near syncope. PMH: a-fib on Eliquis, diastolic CHF, GERD, hypothyroidism, HLD, dementia, cerebral aneurysm, lumbar DDD, PVD   Clinical Impression   Patient seen for OT evaluation. Pt currently functioning at Mod I for functional mobility to the bathroom using a RW, Mod I for toilet transfer (use of BSC frame), and is grossly Mod I for self-care tasks. Pt appears to be performing ADL/functional mobility at baseline. No further OT needs identified. Pt is in agreement. Please reconsult if there is a change in functional status.    Recommendations for follow up therapy are one component of a multi-disciplinary discharge planning process, led by the attending physician.  Recommendations may be updated based on patient status, additional functional criteria and insurance authorization.   Assistance Recommended at Discharge Set up Supervision/Assistance  Patient can return home with the following      Functional Status Assessment  Patient has not had a recent decline in their functional status  Equipment Recommendations  None recommended by OT    Recommendations for Other Services       Precautions / Restrictions Precautions Precautions: Fall Restrictions Weight Bearing Restrictions: No      Mobility Bed Mobility               General bed mobility comments: NT, pt received/left in recliner    Transfers Overall transfer level: Modified independent Equipment used: None, Rolling walker (2 wheels)               General transfer comment: STS from recliner and toilet (use of BSC frame over regular height toilet)      Balance     Sitting balance-Leahy Scale: Good     Standing balance support: During functional activity, No upper extremity supported,  Bilateral upper extremity supported Standing balance-Leahy Scale: Good    General comments (skin integrity, edema, etc.): Pt denied any lightheaded or dizziness with movement.      ADL either performed or assessed with clinical judgement   ADL Overall ADL's : At baseline         Vision Patient Visual Report: No change from baseline       Perception     Praxis      Pertinent Vitals/Pain Pain Assessment Pain Assessment: No/denies pain     Hand Dominance Right   Extremity/Trunk Assessment Upper Extremity Assessment Upper Extremity Assessment: Overall WFL for tasks assessed   Lower Extremity Assessment Lower Extremity Assessment: Overall WFL for tasks assessed       Communication Communication Communication: No difficulties   Cognition Arousal/Alertness: Awake/alert Behavior During Therapy: WFL for tasks assessed/performed Overall Cognitive Status: History of cognitive impairments - at baseline           General Comments: Pt with h/o Alzheimer's at baseline. A&Ox4, pleasant and cooperative, followed commands well.     General Comments     Exercises     Shoulder Instructions      Home Living Family/patient expects to be discharged to:: Assisted living       Home Equipment: Grab bars - tub/shower;Grab bars - toilet;Cane - single point;Shower seat;Rollator (4 wheels)   Additional Comments: [facility]      Prior Functioning/Environment Prior Level of Function : Independent/Modified Independent  Mobility Comments: Mod I with rollator, but does endorse 1 fall ~4 weeks ago, has been receiving in house PT since. ADLs Comments: Pt reports IND for ADLs. Does not drive, friend takes her shopping. She receives 1 meal/day in [employer], prepares other foods herself. Pt reports IND for med mgmt. Uses briefs 2/2 urgency incontinence.        OT Problem List:        OT Treatment/Interventions:      OT Goals(Current goals  can be found in the care plan section) Acute Rehab OT Goals Patient Stated Goal: return to [facility] OT Goal Formulation: All assessment and education complete, DC therapy Time For Goal Achievement: 05/05/23 Potential to Achieve Goals: Good  OT Frequency:      Co-evaluation              AM-PAC OT "6 Clicks" Daily Activity     Outcome Measure Help from another person eating meals?: None Help from another person taking care of personal grooming?: None Help from another person toileting, which includes using toliet, bedpan, or urinal?: None Help from another person bathing (including washing, rinsing, drying)?: None Help from another person to put on and taking off regular upper body clothing?: None Help from another person to put on and taking off regular lower body clothing?: None 6 Click Score: 24   End of Session Equipment Utilized During Treatment: Gait belt;Rolling walker (2 wheels) Nurse Communication: Mobility status  Activity Tolerance: Patient tolerated treatment well Patient left: in chair;with call bell/phone within reach;with chair alarm set  OT Visit Diagnosis: Other abnormalities of gait and mobility (R26.89)                Time: 4098-1191 OT Time Calculation (min): 8 min Charges:  OT General Charges $OT Visit: 1 Visit OT Evaluation $OT Eval Low Complexity: 1 Low  [facility] MS, OTR/L ascom [phone]  04/21/23, 12:40 PM

## 2023-04-21 NOTE — TOC Transition Note (Signed)
Transition of Care [facility]) - CM/SW Discharge Note   Patient Details  Name: Alison Richardson MRN: [id number] Date of Birth: [birth year]  [facility] CM/SW Contact:  Carmina Miller, LCSWA Phone Number: 04/21/2023, 11:34 AM   Clinical Narrative:     CSW spoke with pt's daughter Warden Fillers, she states she will pick pt up.         Patient Goals and CMS Choice      Discharge Placement                         Discharge Plan and Services Additional resources added to the After Visit Summary for                                       Social Determinants of Health (SDOH) Interventions SDOH Screenings   Food Insecurity: No Food Insecurity (04/20/2023)  Housing: Low Risk  (04/20/2023)  Transportation Needs: No Transportation Needs (04/20/2023)  Utilities: Not At Risk (04/20/2023)  Alcohol Screen: Low Risk  (04/09/2023)  Depression (PHQ2-9): Low Risk  (04/09/2023)  Recent Concern: Depression (PHQ2-9) - Medium Risk (01/15/2023)  Financial Resource Strain: Low Risk  (04/09/2023)  Physical Activity: Insufficiently Active (04/09/2023)  Social Connections: Socially Isolated (04/09/2023)  Stress: No Stress Concern Present (04/09/2023)  Tobacco Use: Low Risk  (04/16/2023)     Readmission Risk Interventions    01/26/2022    2:47 PM 11/02/2021   12:38 PM  Readmission Risk Prevention Plan  Transportation Screening Complete Complete  PCP or Specialist Appt within 3-5 Days Complete Complete  HRI or Home Care Consult Complete Complete  Social Work Consult for Recovery Care Planning/Counseling Complete Complete  Palliative Care Screening Not Applicable Not Applicable  Medication Review Oceanographer) Complete Complete

## 2023-04-21 NOTE — Discharge Summary (Signed)
Physician Discharge Summary   Patient: Alison Richardson MRN: [id number] DOB: 02-02-1933  Admit date:     04/20/2023  Discharge date: 04/21/23  Discharge Physician: Loyce Dys   PCP: Larae Grooms, NP    Discharge Diagnoses: Syncope secondary to dehydration Chronic diastolic CHF (congestive heart failure) (HCC) Hypothyroidism Acute kidney injury secondary to dehydration Type 2 diabetes mellitus with peripheral neuropathy (HCC) Atrial fibrillation, chronic (HCC) CAD (coronary artery disease) Gastroesophageal reflux disease Late onset Alzheimer's disease without behavioral disturbance (HCC) Leukocytosis Hyperlipidemia associated with type 2 diabetes mellitus [facility])       Hospital Course: Analyse Angst is a 89 y.o. female with medical history significant of atrial fibrillation on Eliquis, diastolic CHF, GERD, hypothyroidism, hyperlipidemia, dementia presenting with near-syncope.  Patient ports recurring episodes of significant dizziness.  Patient states she has had repeated episodes of going from sitting to standing with significant weakness and dizziness.  Symptoms do not occur when she is laying down.  No hemiparesis or confusion.  No slurred speech.  No chest pain or shortness of breath.  Patient with noted evaluation recently at her PCPs office with concern for possible dehydration.  Patient states she has had increased p.o. intake though she has been consistent with her diuretic use.  On initial evaluation patient was found to be clinically dry.  Orthostatic vitals were negative.  Patient underwent IV fluid therapy with resolution of her dehydration.  Patient worked with physical therapist with no issues walking about.  She has been encouraged to keep herself well-hydrated.  She is therefore cleared for discharge today and to follow-up with her primary care physician.     Consultants: None Procedures performed: None Disposition: Home health Diet recommendation:  Discharge Diet  Orders (From admission, onward)     Start     Ordered   04/21/23 0000  Diet - low sodium heart healthy        04/21/23 1149           Cardiac diet DISCHARGE MEDICATION: Allergies as of 04/21/2023       Reactions   Pantoprazole Nausea And Vomiting   Metformin And Related Diarrhea        Medication List     STOP taking these medications    desloratadine 5 MG tablet Commonly known as: CLARINEX   fluticasone 50 MCG/ACT nasal spray Commonly known as: FLONASE   isosorbide mononitrate 30 MG 24 hr tablet Commonly known as: IMDUR   spironolactone 25 MG tablet Commonly known as: ALDACTONE   tiZANidine 2 MG tablet Commonly known as: ZANAFLEX   torsemide 20 MG tablet Commonly known as: DEMADEX       TAKE these medications    albuterol 108 (90 Base) MCG/ACT inhaler Commonly known as: VENTOLIN HFA Inhale 2 puffs into the lungs every 6 (six) hours as needed for wheezing or shortness of breath.   amiodarone 200 MG tablet Commonly known as: PACERONE Take 100 mg by mouth daily.   apixaban 5 MG Tabs tablet Commonly known as: ELIQUIS Take 5 mg by mouth 2 (two) times daily.   clopidogrel 75 MG tablet Commonly known as: PLAVIX Take 75 mg by mouth daily.   diltiazem 240 MG 24 hr capsule Commonly known as: CARDIZEM CD Take 240 mg by mouth daily.   donepezil 10 MG tablet Commonly known as: ARICEPT TAKE ONE TABLET (10 MG) BY MOUTH AT BEDTIME   famotidine 20 MG tablet Commonly known as: PEPCID TAKE 1 TABLET BY MOUTH ONCE DAILY   [place]  10 MG Tabs tablet Generic drug: dapagliflozin propanediol TAKE 1 TABLET BY MOUTH DAILY BEFORE BREAKFAST   gabapentin 100 MG capsule Commonly known as: NEURONTIN TAKE 1 CAPSULE BY MOUTH DAILY   glipiZIDE 5 MG 24 hr tablet Commonly known as: GLUCOTROL XL TAKE 1 TABLET BY MOUTH DAILY   levothyroxine 88 MCG tablet Commonly known as: SYNTHROID Take 1 tablet (88 mcg total) by mouth daily.   methocarbamol 500 MG  tablet Commonly known as: ROBAXIN Take 500 mg by mouth every 8 (eight) hours as needed for muscle spasms.   metoprolol succinate 100 MG 24 hr tablet Commonly known as: TOPROL-XL TAKE ONE TABLET (100 MG) BY MOUTH EVERY DAY (TAKE WITH OR IMMEDIATELY AFTER A MEAL)   nitroGLYCERIN 0.4 MG SL tablet Commonly known as: NITROSTAT Place 0.4 mg under the tongue every 5 (five) minutes as needed for chest pain.   pravastatin 20 MG tablet Commonly known as: PRAVACHOL Take 20 mg by mouth at bedtime.   traMADol 50 MG tablet Commonly known as: ULTRAM Take 50 mg by mouth every 6 (six) hours as needed for moderate pain.   venlafaxine XR 75 MG 24 hr capsule Commonly known as: EFFEXOR-XR TAKE 1 CAPSULE BY MOUTH ONCE DAILY.        Discharge Exam: Filed Weights   04/20/23 1518  Weight: 74.6 kg   Constitutional:      Appearance: She is normal weight.  HENT:     Head: Normocephalic and atraumatic.     Mouth/Throat:     Mouth: Mucous membranes are most Eyes:     Pupils: Pupils are equal, round, and reactive to light.  Cardiovascular:     Rate and Rhythm: Normal rate and regular rhythm.  Pulmonary:     Effort: Pulmonary effort is normal.  Abdominal:     General: Bowel sounds are normal.  Musculoskeletal:        General: Normal range of motion.  Skin:    General: Skin is warm and dry.  Neurological:     General: No focal deficit present.  Psychiatric:        Mood and Affect: Mood normal.   Condition at discharge: good  Discharge time spent: greater than 30 minutes.  Signed: Loyce Dys, MD [facility] 04/21/2023

## 2023-04-21 NOTE — Evaluation (Addendum)
Physical Therapy Evaluation Patient Details Name: Alison Richardson MRN: [id number] DOB: 02/28/1933 Today's Date: 04/21/2023  History of Present Illness  Pt is an 89 y/o F admitted on 04/20/23 after presenting with c/o near syncope. PMH: a-fib on Eliquis, diastolic CHF, GERD, hypothyroidism, HLD, dementia, cerebral aneurysm, lumbar DDD, PVD  Clinical Impression  Pt seen for PT evaluation with pt agreeable to tx. Pt reports she resides in [employer] [facility] where she's ambulatory with rollator but endorses 1 fall ~4 weeks ago & has been receiving in house PT services since. On this date, pt is able to complete all mobility with mod I. Pt does require some cuing for optimized safety with gait with AD. At this time, pt does not require acute PT services but recommend to continue in house PT once back at ALF.    BP checked in LUE: Supine: 114/73 mmHg (MAP 84), HR 74 bpm Sitting: 119/77 mmHg (MAP 91), HR 87 bpm Standing at 0: 107/70 mmHg (MAP 82), HR 76 bpm Sitting in recliner after walking lap around nurses station: 127/84 mmHg (MAP 96), HR 87 bpm Pt denies c/o symptoms throughout session.    Recommendations for follow up therapy are one component of a multi-disciplinary discharge planning process, led by the attending physician.  Recommendations may be updated based on patient status, additional functional criteria and insurance authorization.  Follow Up Recommendations       Assistance Recommended at Discharge Set up Supervision/Assistance  Patient can return home with the following       Equipment Recommendations None recommended by PT  Recommendations for Other Services       Functional Status Assessment Patient has not had a recent decline in their functional status     Precautions / Restrictions Precautions Precautions: Fall Restrictions Weight Bearing Restrictions: No      Mobility  Bed Mobility Overal bed mobility: Needs Assistance Bed Mobility: Supine to Sit      Supine to sit: Modified independent (Device/Increase time), HOB elevated     General bed mobility comments: use of bed rails    Transfers Overall transfer level: Modified independent Equipment used: None, Rolling walker (2 wheels)               General transfer comment: STS from EOB, recliner, toilet without assistance.    Ambulation/Gait Ambulation/Gait assistance: Modified independent (Device/Increase time) Gait Distance (Feet): 170 Feet Assistive device: Rolling walker (2 wheels)   Gait velocity: slightly decreased     General Gait Details: slightly decreased heel strike BLE. Pt requires cuing to walk to recliner then turn & sit vs retrograde gait. Pt also lets go of walker & will walk ~3 steps to recliner or attempt to walk into bathroom without AD. PT educates pt on benefit of use RW at all times.  Stairs            Wheelchair Mobility    Modified Rankin (Stroke Patients Only)       Balance     Sitting balance-Leahy Scale: Good Sitting balance - Comments: Pt able to don sock sitting EOB, perform peri hygiene on toilet without LOB, Mod I overall.   Standing balance support: During functional activity, No upper extremity supported, Bilateral upper extremity supported Standing balance-Leahy Scale: Good                               Pertinent Vitals/Pain Pain Assessment Pain Assessment: No/denies pain  Home Living Family/patient expects to be discharged to:: Assisted living                 Home Equipment: Grab bars - tub/shower;Grab bars - toilet;Cane - single point;Shower seat;Rollator (4 wheels) Additional Comments: [facility]    Prior Function Prior Level of Function : Independent/Modified Independent             Mobility Comments: Mod I with rollator, but does endorse 1 fall ~4 weeks ago, has been receiving in house PT since. ADLs Comments: Uses briefs 2/2 urgency incontinence     Hand  Dominance        Extremity/Trunk Assessment   Upper Extremity Assessment Upper Extremity Assessment: Overall WFL for tasks assessed    Lower Extremity Assessment Lower Extremity Assessment: Overall WFL for tasks assessed       Communication      Cognition Arousal/Alertness: Awake/alert Behavior During Therapy: WFL for tasks assessed/performed Overall Cognitive Status: Within Functional Limits for tasks assessed                                          General Comments General comments (skin integrity, edema, etc.): Pt toileted, with continent void & BM.    Exercises     Assessment/Plan    PT Assessment All further PT needs can be met in the next venue of care  PT Problem List Decreased strength;Decreased activity tolerance;Decreased balance;Decreased mobility;Decreased knowledge of use of DME;Decreased safety awareness       PT Treatment Interventions      PT Goals (Current goals can be found in the Care Plan section)  Acute Rehab PT Goals Patient Stated Goal: get a bath PT Goal Formulation: With patient Time For Goal Achievement: 05/05/23 Potential to Achieve Goals: Good    Frequency       Co-evaluation               AM-PAC PT "6 Clicks" Mobility  Outcome Measure Help needed turning from your back to your side while in a flat bed without using bedrails?: None Help needed moving from lying on your back to sitting on the side of a flat bed without using bedrails?: None Help needed moving to and from a bed to a chair (including a wheelchair)?: None Help needed standing up from a chair using your arms (e.g., wheelchair or bedside chair)?: None Help needed to walk in hospital room?: None Help needed climbing 3-5 steps with a railing? : A Little 6 Click Score: 23    End of Session   Activity Tolerance: Patient tolerated treatment well Patient left: in chair;with chair alarm set;with call bell/phone within reach        Time:  0955-1019 PT Time Calculation (min) (ACUTE ONLY): 24 min   Charges:   PT Evaluation $PT Eval Low Complexity: 1 Low          Aleda Grana, PT, DPT 04/21/23, 10:37 AM   Sandi Mariscal 04/21/2023, 10:36 AM

## 2023-04-21 NOTE — Progress Notes (Signed)
Mobility Specialist - Progress Note     04/21/23 1206  Mobility  Activity Ambulated independently in room;Stood at bedside;Dangled on edge of bed  Level of Assistance Independent  Assistive Device Front wheel walker  Distance Ambulated (ft) 10 ft  Range of Motion/Exercises Active  Activity Response Tolerated well  Mobility Referral Yes  $Mobility charge 1 Mobility    Pt resting in recliner on RA upon entry. Pt STS and ambulates around bed to sink to perform hygiene task Indep and change gown ModI. Pt returned to bed and left with needs in reach and bed alarm activated.   Johnathan Hausen Mobility Specialist 04/21/23, 12:09 PM

## 2023-04-23 ENCOUNTER — Other Ambulatory Visit: Payer: Medicare PPO

## 2023-04-25 DIAGNOSIS — R488 Other symbolic dysfunctions: Secondary | ICD-10-CM | POA: Diagnosis not present

## 2023-04-25 DIAGNOSIS — G3184 Mild cognitive impairment, so stated: Secondary | ICD-10-CM | POA: Diagnosis not present

## 2023-04-29 DIAGNOSIS — R279 Unspecified lack of coordination: Secondary | ICD-10-CM | POA: Diagnosis not present

## 2023-04-29 DIAGNOSIS — I480 Paroxysmal atrial fibrillation: Secondary | ICD-10-CM | POA: Diagnosis not present

## 2023-04-29 DIAGNOSIS — J189 Pneumonia, unspecified organism: Secondary | ICD-10-CM | POA: Diagnosis not present

## 2023-04-29 DIAGNOSIS — A419 Sepsis, unspecified organism: Secondary | ICD-10-CM | POA: Diagnosis not present

## 2023-04-29 DIAGNOSIS — M6281 Muscle weakness (generalized): Secondary | ICD-10-CM | POA: Diagnosis not present

## 2023-04-29 DIAGNOSIS — G301 Alzheimer's disease with late onset: Secondary | ICD-10-CM | POA: Diagnosis not present

## 2023-04-29 DIAGNOSIS — R2689 Other abnormalities of gait and mobility: Secondary | ICD-10-CM | POA: Diagnosis not present

## 2023-04-30 ENCOUNTER — Encounter: Payer: Self-pay | Admitting: Nurse Practitioner

## 2023-04-30 ENCOUNTER — Ambulatory Visit: Payer: Medicare PPO | Admitting: Nurse Practitioner

## 2023-04-30 VITALS — BP 112/75 | HR 83 | Temp 97.9°F | Wt 172.5 lb

## 2023-04-30 DIAGNOSIS — I5032 Chronic diastolic (congestive) heart failure: Secondary | ICD-10-CM

## 2023-04-30 DIAGNOSIS — Z09 Encounter for follow-up examination after completed treatment for conditions other than malignant neoplasm: Secondary | ICD-10-CM | POA: Diagnosis not present

## 2023-04-30 NOTE — Assessment & Plan Note (Signed)
Chronic.  Ongoing.  Torsemide was stopped by the hospital.  Weight is up 8lbs since her visit in this office 2 days prior to admission.  Recommend Torsemide daily for the next two days.  Follow up with Cardiology next week.  CMP checked at visit today.  Continue to weight daily at home.   - Reminded to call for an overnight weight gain of >2 pounds or a weekly weight gain of >5 pounds - not adding salt to food and read food labels. Reviewed the importance of keeping daily sodium intake to 2000mg  daily. - Avoid Ibuprofen products.

## 2023-04-30 NOTE — Progress Notes (Signed)
BP 112/75   Pulse 83   Temp 97.9 F (36.6 C) (Oral)   Wt 172 lb 8 oz (78.2 kg)   SpO2 97%   BMI 27.84 kg/m    Subjective:    Patient ID: Alison Richardson, female    DOB: 1933-04-18, 89 y.o.   MRN: [id number]  HPI: Alison Richardson is a 89 y.o. female  Chief Complaint  Patient presents with   Hospitalization Follow-up   Transition of Care Hospital Follow up.   Hospital/Facility: [facility] D/C Physician: Dr. Meriam Sprague D/C Date: 04/21/23  Records Requested: NA Records Received: Yes Records Reviewed: Yes  Diagnoses on Discharge:   Doing well since discharge.  Her weight has been up since she was discharged.  Overall she is feeling well.  Not currently taking the Torsemide.  Has an appt with Cardiology next week.   Denies HA, CP, SOB, dizziness, palpitations, visual changes.  Does feel like her lower extremities are swollen.   Syncope secondary to dehydration Chronic diastolic CHF (congestive heart failure) (HCC) Hypothyroidism Acute kidney injury secondary to dehydration Type 2 diabetes mellitus with peripheral neuropathy (HCC) Atrial fibrillation, chronic (HCC) CAD (coronary artery disease) Gastroesophageal reflux disease Late onset Alzheimer's disease without behavioral disturbance (HCC) Leukocytosis Hyperlipidemia associated with type 2 diabetes mellitus (HCC)  Date of interactive Contact within 48 hours of discharge:  Contact was through:  none  Date of 7 day or 14 day face-to-face visit:    within 7 days  Outpatient Encounter Medications as of 04/30/2023  Medication Sig   amiodarone (PACERONE) 200 MG tablet Take 100 mg by mouth daily.   apixaban (ELIQUIS) 5 MG TABS tablet Take 5 mg by mouth 2 (two) times daily.   clopidogrel (PLAVIX) 75 MG tablet Take 75 mg by mouth daily.   dapagliflozin propanediol (FARXIGA) 10 MG TABS tablet TAKE 1 TABLET BY MOUTH DAILY BEFORE BREAKFAST   diltiazem (CARDIZEM CD) 240 MG 24 hr capsule Take 240 mg by mouth daily.   donepezil (ARICEPT) 10  MG tablet TAKE ONE TABLET (10 MG) BY MOUTH AT BEDTIME   gabapentin (NEURONTIN) 100 MG capsule TAKE 1 CAPSULE BY MOUTH DAILY   glipiZIDE (GLUCOTROL XL) 5 MG 24 hr tablet TAKE 1 TABLET BY MOUTH DAILY   levothyroxine (SYNTHROID) 88 MCG tablet Take 1 tablet (88 mcg total) by mouth daily.   metoprolol succinate (TOPROL-XL) 100 MG 24 hr tablet TAKE ONE TABLET (100 MG) BY MOUTH EVERY DAY (TAKE WITH OR IMMEDIATELY AFTER A MEAL)   nitroGLYCERIN (NITROSTAT) 0.4 MG SL tablet Place 0.4 mg under the tongue every 5 (five) minutes as needed for chest pain.   pravastatin (PRAVACHOL) 20 MG tablet Take 20 mg by mouth at bedtime.   venlafaxine XR (EFFEXOR-XR) 75 MG 24 hr capsule TAKE 1 CAPSULE BY MOUTH ONCE DAILY.   [DISCONTINUED] famotidine (PEPCID) 20 MG tablet TAKE 1 TABLET BY MOUTH ONCE DAILY   [DISCONTINUED] methocarbamol (ROBAXIN) 500 MG tablet Take 500 mg by mouth every 8 (eight) hours as needed for muscle spasms.   albuterol (VENTOLIN HFA) 108 (90 Base) MCG/ACT inhaler Inhale 2 puffs into the lungs every 6 (six) hours as needed for wheezing or shortness of breath. (Patient not taking: Reported on 04/30/2023)   traMADol (ULTRAM) 50 MG tablet Take 50 mg by mouth every 6 (six) hours as needed for moderate pain. (Patient not taking: Reported on 04/30/2023)   No facility-administered encounter medications on file as of 04/30/2023.    Diagnostic Tests Reviewed/Disposition: Reviewed  Consults: Reviewed  Discharge Instructions: reviewed during visit.  Disease/illness Education: provided  Home Health/Community Services Discussions/Referrals: NA  Establishment or re-establishment of referral orders for community resources: NA  Discussion with other health care providers: NA  Assessment and Support of treatment regimen adherence: Provided during visit.  Appointments Coordinated with: Patient  Education for self-management, independent living, and ADLs: Provided during visit.  Relevant past medical, surgical,  family and social history reviewed and updated as indicated. Interim medical history since our last visit reviewed. Allergies and medications reviewed and updated.  Review of Systems  Eyes:  Negative for visual disturbance.  Respiratory:  Negative for cough, chest tightness and shortness of breath.   Cardiovascular:  Positive for leg swelling. Negative for chest pain and palpitations.  Neurological:  Negative for dizziness and headaches.    Per HPI unless specifically indicated above     Objective:    BP 112/75   Pulse 83   Temp 97.9 F (36.6 C) (Oral)   Wt 172 lb 8 oz (78.2 kg)   SpO2 97%   BMI 27.84 kg/m   Wt Readings from Last 3 Encounters:  04/30/23 172 lb 8 oz (78.2 kg)  04/20/23 164 lb 7.4 oz (74.6 kg)  04/16/23 169 lb 1.6 oz (76.7 kg)    Physical Exam Vitals and nursing note reviewed.  Constitutional:      General: She is not in acute distress.    Appearance: Normal appearance. She is normal weight. She is not ill-appearing, toxic-appearing or diaphoretic.  HENT:     Head: Normocephalic.     Right Ear: External ear normal.     Left Ear: External ear normal.     Nose: Nose normal.     Mouth/Throat:     Mouth: Mucous membranes are moist.     Pharynx: Oropharynx is clear.  Eyes:     General:        Right eye: No discharge.        Left eye: No discharge.     Extraocular Movements: Extraocular movements intact.     Conjunctiva/sclera: Conjunctivae normal.     Pupils: Pupils are equal, round, and reactive to light.  Cardiovascular:     Rate and Rhythm: Normal rate and regular rhythm.     Heart sounds: No murmur heard. Pulmonary:     Effort: Pulmonary effort is normal. No respiratory distress.     Breath sounds: Normal breath sounds. No wheezing or rales.  Musculoskeletal:     Cervical back: Normal range of motion and neck supple.     Right lower leg: Edema (trace edema) present.     Left lower leg: Edema (trace edema) present.  Skin:    General: Skin is  warm and dry.     Capillary Refill: Capillary refill takes less than 2 seconds.  Neurological:     General: No focal deficit present.     Mental Status: She is alert and oriented to person, place, and time. Mental status is at baseline.  Psychiatric:        Mood and Affect: Mood normal.        Behavior: Behavior normal.        Thought Content: Thought content normal.        Judgment: Judgment normal.     Results for orders placed or performed during the hospital encounter of 04/20/23  CBC  Result Value Ref Range   WBC 13.5 (H) 4.0 - 10.5 K/uL   RBC 6.14 (H) 3.87 - 5.11 MIL/uL  Hemoglobin 15.7 (H) 12.0 - 15.0 g/dL   HCT 16.1 (H) 09.6 - 04.5 %   MCV 81.3 80.0 - 100.0 fL   MCH 25.6 (L) 26.0 - 34.0 pg   MCHC 31.5 30.0 - 36.0 g/dL   RDW 40.9 (H) 81.1 - 91.4 %   Platelets 397 150 - 400 K/uL   nRBC 0.0 0.0 - 0.2 %  Comprehensive metabolic panel  Result Value Ref Range   Sodium 133 (L) 135 - 145 mmol/L   Potassium 3.3 (L) 3.5 - 5.1 mmol/L   Chloride 96 (L) 98 - 111 mmol/L   CO2 23 22 - 32 mmol/L   Glucose, Bld 303 (H) 70 - 99 mg/dL   BUN 44 (H) 8 - 23 mg/dL   Creatinine, Ser 7.82 (H) 0.44 - 1.00 mg/dL   Calcium 8.8 (L) 8.9 - 10.3 mg/dL   Total Protein 7.0 6.5 - 8.1 g/dL   Albumin 3.8 3.5 - 5.0 g/dL   AST 25 15 - 41 U/L   ALT 20 0 - 44 U/L   Alkaline Phosphatase 120 38 - 126 U/L   Total Bilirubin 0.8 0.3 - 1.2 mg/dL   GFR, Estimated 47 (L) >60 mL/min   Anion gap 14 5 - 15  Lipase, blood  Result Value Ref Range   Lipase 34 11 - 51 U/L  Beta-hydroxybutyric acid  Result Value Ref Range   Beta-Hydroxybutyric Acid 0.14 0.05 - 0.27 mmol/L  Blood gas, venous  Result Value Ref Range   pH, Ven 7.46 (H) 7.25 - 7.43   pCO2, Ven 38 (L) 44 - 60 mmHg   pO2, Ven 42 32 - 45 mmHg   Bicarbonate 27.0 20.0 - 28.0 mmol/L   Acid-Base Excess 3.1 (H) 0.0 - 2.0 mmol/L   O2 Saturation 74.5 %   Patient temperature 37.0    Collection site VEIN   Urinalysis, Routine w reflex microscopic  -Urine, Clean Catch  Result Value Ref Range   Color, Urine YELLOW (A) YELLOW   APPearance HAZY (A) CLEAR   Specific Gravity, Urine 1.026 1.005 - 1.030   pH 5.0 5.0 - 8.0   Glucose, UA >=500 (A) NEGATIVE mg/dL   Hgb urine dipstick SMALL (A) NEGATIVE   Bilirubin Urine NEGATIVE NEGATIVE   Ketones, ur NEGATIVE NEGATIVE mg/dL   Protein, ur NEGATIVE NEGATIVE mg/dL   Nitrite NEGATIVE NEGATIVE   Leukocytes,Ua TRACE (A) NEGATIVE   RBC / HPF 6-10 0 - 5 RBC/hpf   WBC, UA 0-5 0 - 5 WBC/hpf   Bacteria, UA NONE SEEN NONE SEEN   Squamous Epithelial / HPF 0-5 0 - 5 /HPF  Brain natriuretic peptide  Result Value Ref Range   B Natriuretic Peptide 85.6 0.0 - 100.0 pg/mL  CK  Result Value Ref Range   Total CK 24 (L) 38 - 234 U/L  Magnesium  Result Value Ref Range   Magnesium 2.6 (H) 1.7 - 2.4 mg/dL  Glucose, capillary  Result Value Ref Range   Glucose-Capillary 169 (H) 70 - 99 mg/dL  CBC  Result Value Ref Range   WBC 11.2 (H) 4.0 - 10.5 K/uL   RBC 5.10 3.87 - 5.11 MIL/uL   Hemoglobin 13.1 12.0 - 15.0 g/dL   HCT 95.6 21.3 - 08.6 %   MCV 81.8 80.0 - 100.0 fL   MCH 25.7 (L) 26.0 - 34.0 pg   MCHC 31.4 30.0 - 36.0 g/dL   RDW 57.8 (H) 46.9 - 62.9 %   Platelets 282 150 - 400  K/uL   nRBC 0.0 0.0 - 0.2 %  Comprehensive metabolic panel  Result Value Ref Range   Sodium 139 135 - 145 mmol/L   Potassium 3.7 3.5 - 5.1 mmol/L   Chloride 110 98 - 111 mmol/L   CO2 22 22 - 32 mmol/L   Glucose, Bld 106 (H) 70 - 99 mg/dL   BUN 26 (H) 8 - 23 mg/dL   Creatinine, Ser 4.09 0.44 - 1.00 mg/dL   Calcium 8.1 (L) 8.9 - 10.3 mg/dL   Total Protein 5.4 (L) 6.5 - 8.1 g/dL   Albumin 2.9 (L) 3.5 - 5.0 g/dL   AST 17 15 - 41 U/L   ALT 14 0 - 44 U/L   Alkaline Phosphatase 88 38 - 126 U/L   Total Bilirubin 0.7 0.3 - 1.2 mg/dL   GFR, Estimated >81 >19 mL/min   Anion gap 7 5 - 15  Glucose, capillary  Result Value Ref Range   Glucose-Capillary 95 70 - 99 mg/dL  Glucose, capillary  Result Value Ref Range    Glucose-Capillary 103 (H) 70 - 99 mg/dL  Glucose, capillary  Result Value Ref Range   Glucose-Capillary 95 70 - 99 mg/dL  ECHOCARDIOGRAM COMPLETE  Result Value Ref Range   Weight 2,631.41 oz   BP 109/67 mmHg   S' Lateral 2.30 cm   Area-P 1/2 5.02 cm2   Est EF 55 - 60%   Troponin I (High Sensitivity)  Result Value Ref Range   Troponin I (High Sensitivity) 10 <18 ng/L      Assessment & Plan:   Problem List Items Addressed This Visit       Cardiovascular and Mediastinum   Chronic diastolic CHF (congestive heart failure) (HCC)    Chronic.  Ongoing.  Torsemide was stopped by the hospital.  Weight is up 8lbs since her visit in this office 2 days prior to admission.  Recommend Torsemide daily for the next two days.  Follow up with Cardiology next week.  CMP checked at visit today.  Continue to weight daily at home.   - Reminded to call for an overnight weight gain of >2 pounds or a weekly weight gain of >5 pounds - not adding salt to food and read food labels. Reviewed the importance of keeping daily sodium intake to 2000mg  daily. - Avoid Ibuprofen products.       Other Visit Diagnoses     Hospital discharge follow-up    -  Primary   CMP and CBC checked at visit today.  Discussed hydration status as well as weight. Reviewed medications.  Keep appt with Cardiology for follow up.   Relevant Orders   Comp Met (CMET)   CBC w/Diff        Follow up plan: Return in about 1 month (around 05/31/2023).

## 2023-05-01 LAB — COMPREHENSIVE METABOLIC PANEL
ALT: 19 IU/L (ref 0–32)
AST: 17 IU/L (ref 0–40)
Albumin/Globulin Ratio: 2.1 (ref 1.2–2.2)
Albumin: 4 g/dL (ref 3.7–4.7)
Alkaline Phosphatase: 121 IU/L (ref 44–121)
BUN/Creatinine Ratio: 19 (ref 12–28)
BUN: 18 mg/dL (ref 8–27)
Bilirubin Total: 0.2 mg/dL (ref 0.0–1.2)
CO2: 19 mmol/L — ABNORMAL LOW (ref 20–29)
Calcium: 9.1 mg/dL (ref 8.7–10.3)
Chloride: 104 mmol/L (ref 96–106)
Creatinine, Ser: 0.93 mg/dL (ref 0.57–1.00)
Globulin, Total: 1.9 g/dL (ref 1.5–4.5)
Glucose: 106 mg/dL — ABNORMAL HIGH (ref 70–99)
Potassium: 4.1 mmol/L (ref 3.5–5.2)
Sodium: 143 mmol/L (ref 134–144)
Total Protein: 5.9 g/dL — ABNORMAL LOW (ref 6.0–8.5)
eGFR: 59 mL/min/{1.73_m2} — ABNORMAL LOW (ref >59)

## 2023-05-01 LAB — CBC WITH DIFFERENTIAL/PLATELET
Basophils Absolute: 0 10*3/uL (ref 0.0–0.2)
Basos: 0 %
EOS (ABSOLUTE): 0.1 10*3/uL (ref 0.0–0.4)
Eos: 1 %
Hematocrit: 40.1 % (ref 34.0–46.6)
Hemoglobin: 12.8 g/dL (ref 11.1–15.9)
Immature Grans (Abs): 0.1 10*3/uL (ref 0.0–0.1)
Immature Granulocytes: 1 %
Lymphocytes Absolute: 2.2 10*3/uL (ref 0.7–3.1)
Lymphs: 22 %
MCH: 26.2 pg — ABNORMAL LOW (ref 26.6–33.0)
MCHC: 31.9 g/dL (ref 31.5–35.7)
MCV: 82 fL (ref 79–97)
Monocytes Absolute: 0.8 10*3/uL (ref 0.1–0.9)
Monocytes: 8 %
Neutrophils Absolute: 7 10*3/uL (ref 1.4–7.0)
Neutrophils: 68 %
Platelets: 333 10*3/uL (ref 150–450)
RBC: 4.89 x10E6/uL (ref 3.77–5.28)
RDW: 17 % — ABNORMAL HIGH (ref 11.7–15.4)
WBC: 10.2 10*3/uL (ref 3.4–10.8)

## 2023-05-01 NOTE — Progress Notes (Signed)
HI [name].  Your lab work looks good.  Kidney function has returned to normal.  Continue with the plan as discussed yesterday.  Please let me know if you have any questions.

## 2023-05-02 DIAGNOSIS — I471 Supraventricular tachycardia, unspecified: Secondary | ICD-10-CM | POA: Diagnosis not present

## 2023-05-02 DIAGNOSIS — I2510 Atherosclerotic heart disease of native coronary artery without angina pectoris: Secondary | ICD-10-CM | POA: Diagnosis not present

## 2023-05-02 DIAGNOSIS — E1140 Type 2 diabetes mellitus with diabetic neuropathy, unspecified: Secondary | ICD-10-CM | POA: Diagnosis not present

## 2023-05-02 DIAGNOSIS — I739 Peripheral vascular disease, unspecified: Secondary | ICD-10-CM | POA: Diagnosis not present

## 2023-05-02 DIAGNOSIS — I5032 Chronic diastolic (congestive) heart failure: Secondary | ICD-10-CM | POA: Diagnosis not present

## 2023-05-02 DIAGNOSIS — E1169 Type 2 diabetes mellitus with other specified complication: Secondary | ICD-10-CM | POA: Diagnosis not present

## 2023-05-02 DIAGNOSIS — I2781 Cor pulmonale (chronic): Secondary | ICD-10-CM | POA: Diagnosis not present

## 2023-05-02 DIAGNOSIS — I4891 Unspecified atrial fibrillation: Secondary | ICD-10-CM | POA: Diagnosis not present

## 2023-05-02 DIAGNOSIS — G4733 Obstructive sleep apnea (adult) (pediatric): Secondary | ICD-10-CM | POA: Diagnosis not present

## 2023-05-07 DIAGNOSIS — R488 Other symbolic dysfunctions: Secondary | ICD-10-CM | POA: Diagnosis not present

## 2023-05-07 DIAGNOSIS — G3184 Mild cognitive impairment, so stated: Secondary | ICD-10-CM | POA: Diagnosis not present

## 2023-05-08 DIAGNOSIS — A419 Sepsis, unspecified organism: Secondary | ICD-10-CM | POA: Diagnosis not present

## 2023-05-08 DIAGNOSIS — J189 Pneumonia, unspecified organism: Secondary | ICD-10-CM | POA: Diagnosis not present

## 2023-05-08 DIAGNOSIS — G3184 Mild cognitive impairment, so stated: Secondary | ICD-10-CM | POA: Diagnosis not present

## 2023-05-08 DIAGNOSIS — R2689 Other abnormalities of gait and mobility: Secondary | ICD-10-CM | POA: Diagnosis not present

## 2023-05-08 DIAGNOSIS — G301 Alzheimer's disease with late onset: Secondary | ICD-10-CM | POA: Diagnosis not present

## 2023-05-08 DIAGNOSIS — M6281 Muscle weakness (generalized): Secondary | ICD-10-CM | POA: Diagnosis not present

## 2023-05-08 DIAGNOSIS — I480 Paroxysmal atrial fibrillation: Secondary | ICD-10-CM | POA: Diagnosis not present

## 2023-05-08 DIAGNOSIS — R488 Other symbolic dysfunctions: Secondary | ICD-10-CM | POA: Diagnosis not present

## 2023-05-08 DIAGNOSIS — R279 Unspecified lack of coordination: Secondary | ICD-10-CM | POA: Diagnosis not present

## 2023-05-13 DIAGNOSIS — G301 Alzheimer's disease with late onset: Secondary | ICD-10-CM | POA: Diagnosis not present

## 2023-05-13 DIAGNOSIS — R279 Unspecified lack of coordination: Secondary | ICD-10-CM | POA: Diagnosis not present

## 2023-05-13 DIAGNOSIS — A419 Sepsis, unspecified organism: Secondary | ICD-10-CM | POA: Diagnosis not present

## 2023-05-13 DIAGNOSIS — J189 Pneumonia, unspecified organism: Secondary | ICD-10-CM | POA: Diagnosis not present

## 2023-05-13 DIAGNOSIS — M6281 Muscle weakness (generalized): Secondary | ICD-10-CM | POA: Diagnosis not present

## 2023-05-13 DIAGNOSIS — R2689 Other abnormalities of gait and mobility: Secondary | ICD-10-CM | POA: Diagnosis not present

## 2023-05-13 DIAGNOSIS — I480 Paroxysmal atrial fibrillation: Secondary | ICD-10-CM | POA: Diagnosis not present

## 2023-05-14 DIAGNOSIS — G3184 Mild cognitive impairment, so stated: Secondary | ICD-10-CM | POA: Diagnosis not present

## 2023-05-14 DIAGNOSIS — R488 Other symbolic dysfunctions: Secondary | ICD-10-CM | POA: Diagnosis not present

## 2023-05-15 DIAGNOSIS — R279 Unspecified lack of coordination: Secondary | ICD-10-CM | POA: Diagnosis not present

## 2023-05-15 DIAGNOSIS — J189 Pneumonia, unspecified organism: Secondary | ICD-10-CM | POA: Diagnosis not present

## 2023-05-15 DIAGNOSIS — R2689 Other abnormalities of gait and mobility: Secondary | ICD-10-CM | POA: Diagnosis not present

## 2023-05-15 DIAGNOSIS — R488 Other symbolic dysfunctions: Secondary | ICD-10-CM | POA: Diagnosis not present

## 2023-05-15 DIAGNOSIS — G3184 Mild cognitive impairment, so stated: Secondary | ICD-10-CM | POA: Diagnosis not present

## 2023-05-15 DIAGNOSIS — A419 Sepsis, unspecified organism: Secondary | ICD-10-CM | POA: Diagnosis not present

## 2023-05-15 DIAGNOSIS — I480 Paroxysmal atrial fibrillation: Secondary | ICD-10-CM | POA: Diagnosis not present

## 2023-05-15 DIAGNOSIS — M6281 Muscle weakness (generalized): Secondary | ICD-10-CM | POA: Diagnosis not present

## 2023-05-15 DIAGNOSIS — G301 Alzheimer's disease with late onset: Secondary | ICD-10-CM | POA: Diagnosis not present

## 2023-05-17 ENCOUNTER — Encounter: Payer: Self-pay | Admitting: Nurse Practitioner

## 2023-05-17 ENCOUNTER — Ambulatory Visit: Payer: Medicare PPO | Admitting: Nurse Practitioner

## 2023-05-17 ENCOUNTER — Ambulatory Visit: Payer: Self-pay

## 2023-05-17 VITALS — BP 134/84 | HR 103 | Temp 97.7°F | Wt 169.6 lb

## 2023-05-17 DIAGNOSIS — Z7984 Long term (current) use of oral hypoglycemic drugs: Secondary | ICD-10-CM | POA: Diagnosis not present

## 2023-05-17 DIAGNOSIS — H6121 Impacted cerumen, right ear: Secondary | ICD-10-CM | POA: Diagnosis not present

## 2023-05-17 DIAGNOSIS — I152 Hypertension secondary to endocrine disorders: Secondary | ICD-10-CM

## 2023-05-17 DIAGNOSIS — E1159 Type 2 diabetes mellitus with other circulatory complications: Secondary | ICD-10-CM | POA: Diagnosis not present

## 2023-05-17 DIAGNOSIS — I482 Chronic atrial fibrillation, unspecified: Secondary | ICD-10-CM

## 2023-05-17 MED ORDER — VALSARTAN 40 MG PO TABS: 40.0000 mg | ORAL_TABLET | Freq: Every day | ORAL | 0 refills | Status: DC

## 2023-05-17 NOTE — Progress Notes (Unsigned)
BP 134/84   Pulse (!) 103   Temp 97.7 F (36.5 C) (Oral)   Wt 169 lb 9.6 oz (76.9 kg)   SpO2 98%   BMI 27.37 kg/m    Subjective:    Patient ID: Alison Richardson, female    DOB: 05-23-1933, 89 y.o.   MRN: [id number]  HPI: Alison Richardson is a 89 y.o. female  Chief Complaint  Patient presents with   Hypertension   HYPERTENSION with Chronic Kidney Disease Patient states she got up yesterday and was shaking and very fatigued.  Her blood pressure was 176/95.  She just didn't feel well all day.   Hypertension status: uncontrolled  Satisfied with current treatment? yes Duration of hypertension: years BP monitoring frequency:  daily BP range: 140-160/90 BP medication side effects:  no Medication compliance: excellent compliance Previous BP meds:valsartan Aspirin: no Recurrent headaches: no Visual changes: no Palpitations: no Dyspnea: no Chest pain: no Lower extremity edema: no Dizzy/lightheaded: a little bit Feeling unsteady on her feet   Relevant past medical, surgical, family and social history reviewed and updated as indicated. Interim medical history since our last visit reviewed. Allergies and medications reviewed and updated.  Review of Systems  HENT:         Ear wax    Per HPI unless specifically indicated above     Objective:    BP 134/84   Pulse (!) 103   Temp 97.7 F (36.5 C) (Oral)   Wt 169 lb 9.6 oz (76.9 kg)   SpO2 98%   BMI 27.37 kg/m   Wt Readings from Last 3 Encounters:  05/17/23 169 lb 9.6 oz (76.9 kg)  04/30/23 172 lb 8 oz (78.2 kg)  04/20/23 164 lb 7.4 oz (74.6 kg)    Physical Exam Vitals and nursing note reviewed.  Constitutional:      General: She is not in acute distress.    Appearance: Normal appearance. She is normal weight. She is not ill-appearing, toxic-appearing or diaphoretic.  HENT:     Head: Normocephalic.     Right Ear: External ear normal.     Left Ear: External ear normal.     Ears:     Comments: Ear wax in right  ear    Nose: Nose normal.     Mouth/Throat:     Mouth: Mucous membranes are moist.     Pharynx: Oropharynx is clear.  Eyes:     General:        Right eye: No discharge.        Left eye: No discharge.     Extraocular Movements: Extraocular movements intact.     Conjunctiva/sclera: Conjunctivae normal.     Pupils: Pupils are equal, round, and reactive to light.  Cardiovascular:     Rate and Rhythm: Normal rate. Rhythm irregular.     Heart sounds: No murmur heard. Pulmonary:     Effort: Pulmonary effort is normal. No respiratory distress.     Breath sounds: Normal breath sounds. No wheezing or rales.  Musculoskeletal:     Cervical back: Normal range of motion and neck supple.  Skin:    General: Skin is warm and dry.     Capillary Refill: Capillary refill takes less than 2 seconds.  Neurological:     General: No focal deficit present.     Mental Status: She is alert and oriented to person, place, and time. Mental status is at baseline.  Psychiatric:        Mood and Affect:  Mood normal.        Behavior: Behavior normal.        Thought Content: Thought content normal.        Judgment: Judgment normal.     Results for orders placed or performed in visit on 04/30/23  Comp Met (CMET)  Result Value Ref Range   Glucose 106 (H) 70 - 99 mg/dL   BUN 18 8 - 27 mg/dL   Creatinine, Ser 4.09 0.57 - 1.00 mg/dL   eGFR 59 (L) >81 XB/JYN/8.29   BUN/Creatinine Ratio 19 12 - 28   Sodium 143 134 - 144 mmol/L   Potassium 4.1 3.5 - 5.2 mmol/L   Chloride 104 96 - 106 mmol/L   CO2 19 (L) 20 - 29 mmol/L   Calcium 9.1 8.7 - 10.3 mg/dL   Total Protein 5.9 (L) 6.0 - 8.5 g/dL   Albumin 4.0 3.7 - 4.7 g/dL   Globulin, Total 1.9 1.5 - 4.5 g/dL   Albumin/Globulin Ratio 2.1 1.2 - 2.2   Bilirubin Total 0.2 0.0 - 1.2 mg/dL   Alkaline Phosphatase 121 44 - 121 IU/L   AST 17 0 - 40 IU/L   ALT 19 0 - 32 IU/L  CBC w/Diff  Result Value Ref Range   WBC 10.2 3.4 - 10.8 x10E3/uL   RBC 4.89 3.77 - 5.28  x10E6/uL   Hemoglobin 12.8 11.1 - 15.9 g/dL   Hematocrit 56.2 13.0 - 46.6 %   MCV 82 79 - 97 fL   MCH 26.2 (L) 26.6 - 33.0 pg   MCHC 31.9 31.5 - 35.7 g/dL   RDW 86.5 (H) 78.4 - 69.6 %   Platelets 333 150 - 450 x10E3/uL   Neutrophils 68 Not Estab. %   Lymphs 22 Not Estab. %   Monocytes 8 Not Estab. %   Eos 1 Not Estab. %   Basos 0 Not Estab. %   Neutrophils Absolute 7.0 1.4 - 7.0 x10E3/uL   Lymphocytes Absolute 2.2 0.7 - 3.1 x10E3/uL   Monocytes Absolute 0.8 0.1 - 0.9 x10E3/uL   EOS (ABSOLUTE) 0.1 0.0 - 0.4 x10E3/uL   Basophils Absolute 0.0 0.0 - 0.2 x10E3/uL   Immature Granulocytes 1 Not Estab. %   Immature Grans (Abs) 0.1 0.0 - 0.1 x10E3/uL      Assessment & Plan:   Problem List Items Addressed This Visit       Cardiovascular and Mediastinum   Hypertension associated with diabetes (HCC) - Primary   Relevant Medications   valsartan (DIOVAN) 40 MG tablet   Other Relevant Orders   Comp Met (CMET)   Atrial fibrillation, chronic (HCC)    Chronic.  EKG showed AFIB in office today.  Already on Metoprolol 100mg , Diltalizem 240mg , and Amiodarone 100mg  daily.  Will increase Amiodarone to 200mg  daily.  CMP checked at visit today.  Called Cardiology office, they are not able to see her until June 11.  Will follow up with patient on Tuesday and recheck CMP.      Relevant Medications   valsartan (DIOVAN) 40 MG tablet   Other Relevant Orders   EKG 12-Lead (Completed)   Other Visit Diagnoses     Impacted cerumen of right ear       Relevant Orders   Ear Lavage        Follow up plan: Return in about 4 days (around 05/21/2023) for Tuesday at 3pm..

## 2023-05-17 NOTE — Telephone Encounter (Signed)
  Chief Complaint: elevated BP yest 176/95 today 154/112  173/98 Symptoms: weakness Frequency: yesterday Pertinent Negatives: Patient denies weakness or numbness to face arms or legs, chest pain, SOB and H/A Disposition: [] ED /[] Urgent Care (no appt availability in office) / [x] Appointment(In office/virtual)/ []  [facility]/ [] Home Care/ [] Refused Recommended Disposition /[] [facility]/ []  Follow-up with PCP Additional Notes:  Reason for Disposition  Systolic BP  >= 180 OR Diastolic >= 110  Answer Assessment - Initial Assessment Questions 1. BLOOD PRESSURE: "What is the blood pressure?" "Did you take at least two measurements 5 minutes apart?"     176/95 (yesterday)  154/112 repeat BP   173/98 2. ONSET: "When did you take your blood pressure?"     This am  3. HOW: "How did you take your blood pressure?" (e.g., automatic home BP monitor, visiting nurse)     Auto home BP  4. HISTORY: "Do you have a history of high blood pressure?"     yes 5. MEDICINES: "Are you taking any medicines for blood pressure?" "Have you missed any doses recently?"     yes 6. OTHER SYMPTOMS: "Do you have any symptoms?" (e.g., blurred vision, chest pain, difficulty breathing, headache, weakness)     Weakness but better , "not feeling well but better than yesterday  7. PREGNANCY: "Is there any chance you are pregnant?" "When was your last menstrual period?"     N/a  Protocols used: Blood Pressure - High-A-AH

## 2023-05-17 NOTE — Patient Instructions (Addendum)
You are scheduled to see Cardiology on 06/04/23 at 3:30 PM.

## 2023-05-17 NOTE — Assessment & Plan Note (Addendum)
Chronic.  EKG showed AFIB in office today.  Already on Metoprolol 100mg , Diltalizem 240mg , and Amiodarone 100mg  daily.  Will increase Amiodarone to 200mg  daily.  CMP checked at visit today.  Called Cardiology office, they are not able to see her until June 11.  Will follow up with patient on Tuesday and recheck CMP.

## 2023-05-17 NOTE — Progress Notes (Signed)
Results discussed with patient during visit.

## 2023-05-18 LAB — COMPREHENSIVE METABOLIC PANEL
ALT: 24 IU/L (ref 0–32)
AST: 21 IU/L (ref 0–40)
Albumin/Globulin Ratio: 2.3 — ABNORMAL HIGH (ref 1.2–2.2)
Albumin: 4.6 g/dL (ref 3.7–4.7)
Alkaline Phosphatase: 131 IU/L — ABNORMAL HIGH (ref 44–121)
BUN/Creatinine Ratio: 15 (ref 12–28)
BUN: 12 mg/dL (ref 8–27)
Bilirubin Total: 0.4 mg/dL (ref 0.0–1.2)
CO2: 21 mmol/L (ref 20–29)
Calcium: 9.2 mg/dL (ref 8.7–10.3)
Chloride: 105 mmol/L (ref 96–106)
Creatinine, Ser: 0.78 mg/dL (ref 0.57–1.00)
Globulin, Total: 2 g/dL (ref 1.5–4.5)
Glucose: 179 mg/dL — ABNORMAL HIGH (ref 70–99)
Potassium: 3.9 mmol/L (ref 3.5–5.2)
Sodium: 142 mmol/L (ref 134–144)
Total Protein: 6.6 g/dL (ref 6.0–8.5)
eGFR: 73 mL/min/{1.73_m2} (ref >59)

## 2023-05-21 ENCOUNTER — Ambulatory Visit (INDEPENDENT_AMBULATORY_CARE_PROVIDER_SITE_OTHER): Payer: Medicare PPO | Admitting: Nurse Practitioner

## 2023-05-21 ENCOUNTER — Encounter: Payer: Self-pay | Admitting: Nurse Practitioner

## 2023-05-21 VITALS — BP 131/81 | HR 101 | Temp 97.9°F | Wt 172.0 lb

## 2023-05-21 DIAGNOSIS — I482 Chronic atrial fibrillation, unspecified: Secondary | ICD-10-CM

## 2023-05-21 DIAGNOSIS — M545 Low back pain, unspecified: Secondary | ICD-10-CM

## 2023-05-21 DIAGNOSIS — G3184 Mild cognitive impairment, so stated: Secondary | ICD-10-CM | POA: Diagnosis not present

## 2023-05-21 DIAGNOSIS — R488 Other symbolic dysfunctions: Secondary | ICD-10-CM | POA: Diagnosis not present

## 2023-05-21 MED ORDER — TRAMADOL HCL 50 MG PO TABS: 50.0000 mg | ORAL_TABLET | Freq: Four times a day (QID) | ORAL | 0 refills | Status: DC | PRN

## 2023-05-21 NOTE — Progress Notes (Signed)
BP 131/81   Pulse (!) 101   Temp 97.9 F (36.6 C) (Oral)   Wt 172 lb (78 kg)   SpO2 97%   BMI 27.76 kg/m    Subjective:    Patient ID: Alison Richardson, female    DOB: 19-Mar-1933, 89 y.o.   MRN: [id number]  HPI: Alison Richardson is a 89 y.o. female  Chief Complaint  Patient presents with   Hypertension   HYPERTENSION with Chronic Kidney Disease Patient states she is feeling better today.  States she is feeling much better than last week.  Fatigue and SOB have improve.d   Hypertension status: controlled Satisfied with current treatment? yes Duration of hypertension: years BP monitoring frequency:  daily BP range: 140-160/90 BP medication side effects:  no Medication compliance: excellent compliance Previous BP meds:valsartan Aspirin: no Recurrent headaches: no Visual changes: no Palpitations: no Dyspnea: no Chest pain: no Lower extremity edema: no Dizzy/lightheaded: a little bit Feeling unsteady on her feet  BACK PAIN Patient states she is having a pain in her back.  She took extra strength tylenol which does help with her pain.  She woke up this morning with the pain.   Relevant past medical, surgical, family and social history reviewed and updated as indicated. Interim medical history since our last visit reviewed. Allergies and medications reviewed and updated.  Review of Systems  HENT:         Ear wax  Eyes:  Negative for visual disturbance.  Respiratory:  Negative for cough, chest tightness and shortness of breath.   Cardiovascular:  Negative for chest pain, palpitations and leg swelling.  Musculoskeletal:  Positive for back pain.  Neurological:  Negative for dizziness and headaches.    Per HPI unless specifically indicated above     Objective:    BP 131/81   Pulse (!) 101   Temp 97.9 F (36.6 C) (Oral)   Wt 172 lb (78 kg)   SpO2 97%   BMI 27.76 kg/m   Wt Readings from Last 3 Encounters:  05/21/23 172 lb (78 kg)  05/17/23 169 lb 9.6 oz (76.9 kg)   04/30/23 172 lb 8 oz (78.2 kg)    Physical Exam Vitals and nursing note reviewed.  Constitutional:      General: She is not in acute distress.    Appearance: Normal appearance. She is normal weight. She is not ill-appearing, toxic-appearing or diaphoretic.  HENT:     Head: Normocephalic.     Right Ear: External ear normal.     Left Ear: External ear normal.     Ears:     Comments: Ear wax in right ear    Nose: Nose normal.     Mouth/Throat:     Mouth: Mucous membranes are moist.     Pharynx: Oropharynx is clear.  Eyes:     General:        Right eye: No discharge.        Left eye: No discharge.     Extraocular Movements: Extraocular movements intact.     Conjunctiva/sclera: Conjunctivae normal.     Pupils: Pupils are equal, round, and reactive to light.  Cardiovascular:     Rate and Rhythm: Normal rate. Rhythm irregular.     Heart sounds: No murmur heard. Pulmonary:     Effort: Pulmonary effort is normal. No respiratory distress.     Breath sounds: Normal breath sounds. No wheezing or rales.  Musculoskeletal:     Cervical back: Normal range of motion and  neck supple.  Skin:    General: Skin is warm and dry.     Capillary Refill: Capillary refill takes less than 2 seconds.  Neurological:     General: No focal deficit present.     Mental Status: She is alert and oriented to person, place, and time. Mental status is at baseline.  Psychiatric:        Mood and Affect: Mood normal.        Behavior: Behavior normal.        Thought Content: Thought content normal.        Judgment: Judgment normal.     Results for orders placed or performed in visit on 05/17/23  Comp Met (CMET)  Result Value Ref Range   Glucose 179 (H) 70 - 99 mg/dL   BUN 12 8 - 27 mg/dL   Creatinine, Ser 1.61 0.57 - 1.00 mg/dL   eGFR 73 >09 UE/AVW/0.98   BUN/Creatinine Ratio 15 12 - 28   Sodium 142 134 - 144 mmol/L   Potassium 3.9 3.5 - 5.2 mmol/L   Chloride 105 96 - 106 mmol/L   CO2 21 20 - 29  mmol/L   Calcium 9.2 8.7 - 10.3 mg/dL   Total Protein 6.6 6.0 - 8.5 g/dL   Albumin 4.6 3.7 - 4.7 g/dL   Globulin, Total 2.0 1.5 - 4.5 g/dL   Albumin/Globulin Ratio 2.3 (H) 1.2 - 2.2   Bilirubin Total 0.4 0.0 - 1.2 mg/dL   Alkaline Phosphatase 131 (H) 44 - 121 IU/L   AST 21 0 - 40 IU/L   ALT 24 0 - 32 IU/L      Assessment & Plan:   Problem List Items Addressed This Visit       Cardiovascular and Mediastinum   Atrial fibrillation, chronic (HCC) - Primary    Chronic. EKG somewhat improved from last week.  Has only been on Amiodarone 200mg  for 3 days.  CMP checked at visit today.  Keep appt for June 11 with Cardiology.  Follow up in 1 month.       Relevant Orders   Comp Met (CMET)   EKG 12-Lead (Completed)   Other Visit Diagnoses     Acute low back pain, unspecified back pain laterality, unspecified whether sciatica present       Will treat with PRN Tramadol.  Follow up if not improved.   Relevant Medications   traMADol (ULTRAM) 50 MG tablet        Follow up plan: Return in about 1 month (around 06/21/2023) for HR .

## 2023-05-21 NOTE — Progress Notes (Signed)
Results discussed with patient during visit.

## 2023-05-21 NOTE — Assessment & Plan Note (Signed)
Chronic. EKG somewhat improved from last week.  Has only been on Amiodarone 200mg  for 3 days.  CMP checked at visit today.  Keep appt for June 11 with Cardiology.  Follow up in 1 month.

## 2023-05-22 DIAGNOSIS — I4891 Unspecified atrial fibrillation: Secondary | ICD-10-CM | POA: Diagnosis not present

## 2023-05-22 DIAGNOSIS — J189 Pneumonia, unspecified organism: Secondary | ICD-10-CM | POA: Diagnosis not present

## 2023-05-22 DIAGNOSIS — I2781 Cor pulmonale (chronic): Secondary | ICD-10-CM | POA: Diagnosis not present

## 2023-05-22 DIAGNOSIS — I2510 Atherosclerotic heart disease of native coronary artery without angina pectoris: Secondary | ICD-10-CM | POA: Diagnosis not present

## 2023-05-22 DIAGNOSIS — R2689 Other abnormalities of gait and mobility: Secondary | ICD-10-CM | POA: Diagnosis not present

## 2023-05-22 DIAGNOSIS — E1169 Type 2 diabetes mellitus with other specified complication: Secondary | ICD-10-CM | POA: Diagnosis not present

## 2023-05-22 DIAGNOSIS — I5032 Chronic diastolic (congestive) heart failure: Secondary | ICD-10-CM | POA: Diagnosis not present

## 2023-05-22 DIAGNOSIS — A419 Sepsis, unspecified organism: Secondary | ICD-10-CM | POA: Diagnosis not present

## 2023-05-22 DIAGNOSIS — G301 Alzheimer's disease with late onset: Secondary | ICD-10-CM | POA: Diagnosis not present

## 2023-05-22 DIAGNOSIS — I471 Supraventricular tachycardia, unspecified: Secondary | ICD-10-CM | POA: Diagnosis not present

## 2023-05-22 DIAGNOSIS — M6281 Muscle weakness (generalized): Secondary | ICD-10-CM | POA: Diagnosis not present

## 2023-05-22 DIAGNOSIS — I739 Peripheral vascular disease, unspecified: Secondary | ICD-10-CM | POA: Diagnosis not present

## 2023-05-22 DIAGNOSIS — I4892 Unspecified atrial flutter: Secondary | ICD-10-CM | POA: Diagnosis not present

## 2023-05-22 DIAGNOSIS — R279 Unspecified lack of coordination: Secondary | ICD-10-CM | POA: Diagnosis not present

## 2023-05-22 DIAGNOSIS — I480 Paroxysmal atrial fibrillation: Secondary | ICD-10-CM | POA: Diagnosis not present

## 2023-05-22 LAB — COMPREHENSIVE METABOLIC PANEL: Sodium: 141 mmol/L (ref 134–144)

## 2023-05-24 ENCOUNTER — Other Ambulatory Visit: Payer: Self-pay

## 2023-05-24 ENCOUNTER — Emergency Department: Payer: Medicare PPO

## 2023-05-24 ENCOUNTER — Emergency Department
Admission: EM | Admit: 2023-05-24 | Discharge: 2023-05-25 | Disposition: A | Discharge status: Home / Self Care | Payer: Medicare PPO | Attending: Emergency Medicine | Admitting: Emergency Medicine

## 2023-05-24 DIAGNOSIS — G301 Alzheimer's disease with late onset: Secondary | ICD-10-CM | POA: Diagnosis not present

## 2023-05-24 DIAGNOSIS — M25461 Effusion, right knee: Secondary | ICD-10-CM | POA: Diagnosis not present

## 2023-05-24 DIAGNOSIS — Z7901 Long term (current) use of anticoagulants: Secondary | ICD-10-CM | POA: Insufficient documentation

## 2023-05-24 DIAGNOSIS — W19XXXA Unspecified fall, initial encounter: Secondary | ICD-10-CM | POA: Diagnosis not present

## 2023-05-24 DIAGNOSIS — S0083XA Contusion of other part of head, initial encounter: Secondary | ICD-10-CM | POA: Insufficient documentation

## 2023-05-24 DIAGNOSIS — M7989 Other specified soft tissue disorders: Secondary | ICD-10-CM | POA: Diagnosis not present

## 2023-05-24 DIAGNOSIS — A419 Sepsis, unspecified organism: Secondary | ICD-10-CM | POA: Diagnosis not present

## 2023-05-24 DIAGNOSIS — I10 Essential (primary) hypertension: Secondary | ICD-10-CM | POA: Diagnosis not present

## 2023-05-24 DIAGNOSIS — I480 Paroxysmal atrial fibrillation: Secondary | ICD-10-CM | POA: Diagnosis not present

## 2023-05-24 DIAGNOSIS — J189 Pneumonia, unspecified organism: Secondary | ICD-10-CM | POA: Diagnosis not present

## 2023-05-24 DIAGNOSIS — R609 Edema, unspecified: Secondary | ICD-10-CM | POA: Diagnosis not present

## 2023-05-24 DIAGNOSIS — R2689 Other abnormalities of gait and mobility: Secondary | ICD-10-CM | POA: Diagnosis not present

## 2023-05-24 DIAGNOSIS — S199XXA Unspecified injury of neck, initial encounter: Secondary | ICD-10-CM | POA: Diagnosis not present

## 2023-05-24 DIAGNOSIS — S8001XA Contusion of right knee, initial encounter: Secondary | ICD-10-CM | POA: Insufficient documentation

## 2023-05-24 DIAGNOSIS — T148XXA Other injury of unspecified body region, initial encounter: Secondary | ICD-10-CM

## 2023-05-24 DIAGNOSIS — W0110XA Fall on same level from slipping, tripping and stumbling with subsequent striking against unspecified object, initial encounter: Secondary | ICD-10-CM | POA: Insufficient documentation

## 2023-05-24 DIAGNOSIS — S0003XA Contusion of scalp, initial encounter: Secondary | ICD-10-CM | POA: Diagnosis not present

## 2023-05-24 DIAGNOSIS — M6281 Muscle weakness (generalized): Secondary | ICD-10-CM | POA: Diagnosis not present

## 2023-05-24 DIAGNOSIS — R279 Unspecified lack of coordination: Secondary | ICD-10-CM | POA: Diagnosis not present

## 2023-05-24 DIAGNOSIS — M25561 Pain in right knee: Secondary | ICD-10-CM | POA: Diagnosis present

## 2023-05-24 LAB — COMPREHENSIVE METABOLIC PANEL
AST: 18 IU/L (ref 0–40)
Albumin/Globulin Ratio: 1.9 (ref 1.2–2.2)
Albumin: 4.3 g/dL (ref 3.7–4.7)
Alkaline Phosphatase: 135 IU/L — ABNORMAL HIGH (ref 44–121)
BUN/Creatinine Ratio: 19 (ref 12–28)
BUN: 18 mg/dL (ref 8–27)
Bilirubin Total: 0.2 mg/dL (ref 0.0–1.2)
CO2: 16 mmol/L — ABNORMAL LOW (ref 20–29)
Calcium: 9 mg/dL (ref 8.7–10.3)
Chloride: 103 mmol/L (ref 96–106)
Globulin, Total: 2.3 g/dL (ref 1.5–4.5)
Glucose: 126 mg/dL — ABNORMAL HIGH (ref 70–99)
Potassium: 3.7 mmol/L (ref 3.5–5.2)
Total Protein: 6.6 g/dL (ref 6.0–8.5)
eGFR: 59 mL/min/{1.73_m2} — ABNORMAL LOW (ref >59)

## 2023-05-24 LAB — CBC
HCT: 42.4 % (ref 36.0–46.0)
Hemoglobin: 13.1 g/dL (ref 12.0–15.0)
MCH: 25.7 pg — ABNORMAL LOW (ref 26.0–34.0)
MCHC: 30.9 g/dL (ref 30.0–36.0)
MCV: 83.1 fL (ref 80.0–100.0)
Platelets: 392 10*3/uL (ref 150–400)
RBC: 5.1 MIL/uL (ref 3.87–5.11)
RDW: 17.1 % — ABNORMAL HIGH (ref 11.5–15.5)
WBC: 9.2 10*3/uL (ref 4.0–10.5)
nRBC: 0 % (ref 0.0–0.2)

## 2023-05-24 LAB — BASIC METABOLIC PANEL
Anion gap: 14 (ref 5–15)
BUN: 13 mg/dL (ref 8–23)
CO2: 22 mmol/L (ref 22–32)
Calcium: 8.9 mg/dL (ref 8.9–10.3)
Chloride: 104 mmol/L (ref 98–111)
Creatinine, Ser: 0.8 mg/dL (ref 0.44–1.00)
GFR, Estimated: >60 mL/min (ref >60)
Glucose, Bld: 201 mg/dL — ABNORMAL HIGH (ref 70–99)
Potassium: 3.2 mmol/L — ABNORMAL LOW (ref 3.5–5.1)
Sodium: 140 mmol/L (ref 135–145)

## 2023-05-24 MED ORDER — ACETAMINOPHEN 500 MG PO TABS
1000.0000 mg | ORAL_TABLET | Freq: Once | ORAL | Status: AC
  Administered 2023-05-25: 1000 mg via ORAL
  Filled 2023-05-24: qty 2

## 2023-05-24 MED ORDER — POTASSIUM CHLORIDE CRYS ER 20 MEQ PO TBCR
40.0000 meq | EXTENDED_RELEASE_TABLET | Freq: Once | ORAL | Status: AC
  Administered 2023-05-25: 40 meq via ORAL
  Filled 2023-05-24: qty 2

## 2023-05-24 NOTE — ED Notes (Signed)
Blue top sent down. 

## 2023-05-24 NOTE — ED Provider Notes (Signed)
[facility] Provider Note    Event Date/Time   First MD Initiated Contact with Patient 05/24/23 2328     (approximate)   History   Fall   HPI  Areatha Seegert is a 89 y.o. female who presents to the emergency department today after a fall.  The patient states that she was turning and she thinks she tripped over her cane.  Fell onto her right knee and hit her forehead.  Denies any loss of consciousness.  Denies any concurrent chest pain or palpitations.  States she had felt in her normal state of health earlier in the day.  This time my exam she is primarily having pain in her right knee with some discomfort to her forehead.     Physical Exam   Triage Vital Signs: ED Triage Vitals [05/24/23 2101]  Enc Vitals Group     BP 134/79     Pulse Rate 86     Resp 16     Temp 98 F (36.7 C)     Temp Source Oral     SpO2 96 %     Weight 175 lb (79.4 kg)     Height 5\' 6"  (1.676 m)     Head Circumference      Peak Flow      Pain Score 0     Pain Loc      Pain Edu?      Excl. in GC?     Most recent vital signs: Vitals:   05/24/23 2101  BP: 134/79  Pulse: 86  Resp: 16  Temp: 98 F (36.7 C)  SpO2: 96%   General: Awake, alert, oriented. CV:  Good peripheral perfusion. Regular rate and rhythm. Resp:  Normal effort. Lungs clear. Abd:  No distention.  Other:  No spinal tenderness. Right knee with some bruising and effusion. Forehead with hematoma with abrasion type lesion, no laceration requiring advanced closure.   ED Results / Procedures / Treatments   Labs (all labs ordered are listed, but only abnormal results are displayed) Labs Reviewed  CBC - Abnormal; Notable for the following components:      Result Value   MCH 25.7 (*)    RDW 17.1 (*)    All other components within normal limits  BASIC METABOLIC PANEL - Abnormal; Notable for the following components:   Potassium 3.2 (*)    Glucose, Bld 201 (*)    All other components within normal  limits     EKG  None   RADIOLOGY I independently interpreted and visualized the CT head. My interpretation: No intracranial bleed Radiology interpretation:  IMPRESSION:  1. Midline frontal scalp hematoma. No acute intracranial  abnormality. No skull fracture.  2. Stable atrophy and chronic small vessel ischemia.  3. Unchanged subcentimeter left frontal convexity meningioma.  4. 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    I independently interpreted and visualized the CT cervical spine. My interpretation: No fracture Radiology interpretation:  IMPRESSION:  1. No acute fracture or traumatic subluxation of the cervical spine.  2. Multilevel degenerative disc disease and facet hypertrophy.    I independently interpreted and visualized the right knee. My interpretation: No fracture Radiology interpretation:  IMPRESSION:  1. No acute fracture or dislocation of the right knee.  2. Moderate to large joint effusion.  3. Chondrocalcinosis and mild osteoarthritis.      PROCEDURES:  Critical Care performed: No    MEDICATIONS ORDERED IN ED: Medications  potassium  chloride SA (KLOR-CON M) CR tablet 40 mEq (has no administration in time range)     IMPRESSION / MDM / ASSESSMENT AND PLAN / ED COURSE  I reviewed the triage vital signs and the nursing notes.                              Differential diagnosis includes, but is not limited to, intracranial bleed, fracture, dislocation  Patient's presentation is most consistent with acute presentation with potential threat to life or bodily function.  Patient presented to the emergency department today after mechanical fall from tripping on her cane.  Complains primarily of right knee pain and forehead injury.  On exam patient does have effusion and some bruising to the right knee.  Hematoma to forehead.  Fortunately CT scans did not show any intracranial bleed or fractures of the head spine or right knee.   Patient however is concerned that she cannot bear weight on that knee given pain and effusion.  Is a resident of [facility] so there was some hope about getting her to the rehab facility there tonight however that was not able to be done.  Will have patient be seen by TOC and PT in the morning.   FINAL CLINICAL IMPRESSION(S) / ED DIAGNOSES   Final diagnoses:  Fall, initial encounter  Knee effusion, right  Hematoma      Note:  This document was prepared using Dragon voice recognition software and may include unintentional dictation errors.    Phineas Semen, MD 05/25/23 [id number]

## 2023-05-24 NOTE — ED Triage Notes (Signed)
Pt to ed from [facility] for a unwitnessed fall. Pt denies LOC. Pt is on eliquis.  HR 100  97% 150/82 Some mild swelling to right knee.

## 2023-05-24 NOTE — ED Provider Notes (Incomplete)
[facility] Provider Note    Event Date/Time   First MD Initiated Contact with Patient 05/24/23 2328     (approximate)   History   Fall   HPI  Alison Richardson is a 89 y.o. female who presents to the emergency department today after a fall.  The patient states that she was turning and she thinks she tripped over her cane.  Fell onto her right knee and hit her forehead.  Denies any loss of consciousness.  Denies any concurrent chest pain or palpitations.  States she had felt in her normal state of health earlier in the day.  This time my exam she is primarily having pain in her right knee with some discomfort to her forehead.     Physical Exam   Triage Vital Signs: ED Triage Vitals [05/24/23 2101]  Enc Vitals Group     BP 134/79     Pulse Rate 86     Resp 16     Temp 98 F (36.7 C)     Temp Source Oral     SpO2 96 %     Weight 175 lb (79.4 kg)     Height 5\' 6"  (1.676 m)     Head Circumference      Peak Flow      Pain Score 0     Pain Loc      Pain Edu?      Excl. in GC?     Most recent vital signs: Vitals:   05/24/23 2101  BP: 134/79  Pulse: 86  Resp: 16  Temp: 98 F (36.7 C)  SpO2: 96%   General: Awake, alert, oriented. CV:  Good peripheral perfusion. Regular rate and rhythm. Resp:  Normal effort. Lungs clear. Abd:  No distention.  Other:  No spinal tenderness. Right knee with some bruising and effusion. Forehead ***   ED Results / Procedures / Treatments   Labs (all labs ordered are listed, but only abnormal results are displayed) Labs Reviewed  CBC - Abnormal; Notable for the following components:      Result Value   MCH 25.7 (*)    RDW 17.1 (*)    All other components within normal limits  BASIC METABOLIC PANEL - Abnormal; Notable for the following components:   Potassium 3.2 (*)    Glucose, Bld 201 (*)    All other components within normal limits     EKG  ***   RADIOLOGY I independently interpreted and  visualized the CT head. My interpretation: No intracranial bleed Radiology interpretation:  IMPRESSION:  1. Midline frontal scalp hematoma. No acute intracranial  abnormality. No skull fracture.  2. Stable atrophy and chronic small vessel ischemia.  3. Unchanged subcentimeter left frontal convexity meningioma.  4. 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    I independently interpreted and visualized the CT cervical spine. My interpretation: No fracture Radiology interpretation:  IMPRESSION:  1. No acute fracture or traumatic subluxation of the cervical spine.  2. Multilevel degenerative disc disease and facet hypertrophy.    I independently interpreted and visualized the right knee. My interpretation: No fracture Radiology interpretation:  IMPRESSION:  1. No acute fracture or dislocation of the right knee.  2. Moderate to large joint effusion.  3. Chondrocalcinosis and mild osteoarthritis.      PROCEDURES:  Critical Care performed: Yes  CRITICAL CARE Performed by: Phineas Semen   Total critical care time: *** minutes  Critical care time was  exclusive of separately billable procedures and treating other patients.  Critical care was necessary to treat or prevent imminent or life-threatening deterioration.  Critical care was time spent personally by me on the following activities: development of treatment plan with patient and/or surrogate as well as nursing, discussions with consultants, evaluation of patient's response to treatment, examination of patient, obtaining history from patient or surrogate, ordering and performing treatments and interventions, ordering and review of laboratory studies, ordering and review of radiographic studies, pulse oximetry and re-evaluation of patient's condition.   Procedures    MEDICATIONS ORDERED IN ED: Medications  potassium chloride SA (KLOR-CON M) CR tablet 40 mEq (has no administration in time range)      IMPRESSION / MDM / ASSESSMENT AND PLAN / ED COURSE  I reviewed the triage vital signs and the nursing notes.                              Differential diagnosis includes, but is not limited to, ***  Patient's presentation is most consistent with {EM COPA:27473}   ***The patient is on the cardiac monitor to evaluate for evidence of arrhythmia and/or significant heart rate changes.  ***      FINAL CLINICAL IMPRESSION(S) / ED DIAGNOSES   Final diagnoses:  None     Rx / DC Orders   ED Discharge Orders     None        Note:  This document was prepared using Dragon voice recognition software and may include unintentional dictation errors.

## 2023-05-24 NOTE — Progress Notes (Signed)
Please let patient know that her kidney function looks good.  She appears to be tolerating the increased dose of amiodarone well.  Continue with current medicaiton regimen.

## 2023-05-25 NOTE — ED Notes (Signed)
Patient saturated in urine; this RN provided hygiene care, changed bed linens, applied brief, and gave new warm blanket.

## 2023-05-25 NOTE — ED Notes (Signed)
Patient states that her daughter will be coming to pick her up from the emergency department, as the patient lives in independent living at her facility. This RN spoke with charge RN Marylene Land, who stated that this is fine to take place. Patient informed that this is fine, daughter to come and pick up patient.

## 2023-05-25 NOTE — ED Notes (Signed)
[facility] to transport to [facility] 

## 2023-05-25 NOTE — Discharge Instructions (Signed)
Please seek medical attention for any high fevers, chest pain, shortness of breath, change in behavior, persistent vomiting, bloody stool or any other new or concerning symptoms.  

## 2023-05-25 NOTE — ED Notes (Addendum)
This charge RN spoke with pt and EMS who were coming to pick up pt to transport back to independent living facility.  This Clinical research associate spoke with pt, who expressed that she lives alone, and does not feel like she would be equipped to care for herself at home d/t new lack of mobility post fall, with right knee pain.  This Clinical research associate asked if pt had any family who could come and help, but pt states that she does not.  Pt requested to be sent to the rehab department at her facility, but it was expressed by this writer again, that due to lack of beds in rehab, and need for possible insurance approval, that it would not be possible to send her to rehab facility tonight, as was told to [place] Charity fundraiser when she called facility previously tonight.  This Clinical research associate told pt that if she is unable to care for herself at home, that it would be unsafe for pt to be discharged back to her apt.  This Clinical research associate spoke with MD Derrill Kay regarding pts unsafe disposition, and will change pts dispo to "boarder."    This Clinical research associate also spoke with [place] at [facility] [place], explaining situation. Neysa Bonito was understanding of situation, and also expressed that pt would benefit from PT and SW evaluation prior to being discharged, as it would help with determining if pt would need rehab or not. Pt is understanding of plan at this time and agreeable to stay and speak with the multidisciplinary regarding her disposition from ER.

## 2023-05-25 NOTE — ED Notes (Incomplete)
Patient states that she wants to go home, and no longer wants to be in the hospital. This Rn cont

## 2023-05-25 NOTE — Progress Notes (Signed)
CSW contacted [facility] and spoke with the front desk who believes admissions doesn't work on the weekends to review SNF referrals. CSW was transferred to [facility], Engineer, site on duty. CSW left an voicemail requesting a call back.

## 2023-05-25 NOTE — ED Notes (Signed)
Pt and fried provided a fresh cup of ice water per pt request. One bed rail left down, one raised for pt safety. Pt asked to keep one bed railing down. Bed locked in lowest position and pt informed on the proper use and location of the call bell. Pt and pts friend denied needing anything else at this time.

## 2023-05-25 NOTE — ED Notes (Signed)
Pt taken to bathroom to have a bowel movement. Pt also washed her face and brushed her teeth while she was in there.

## 2023-05-25 NOTE — Evaluation (Signed)
Physical Therapy Evaluation Patient Details Name: Bethney Onaga MRN: [id number] DOB: 1933/11/05 Today's Date: 05/25/2023  History of Present Illness  Camia Schaedler is a 89 y.o. female who presents to the emergency department today after a fall.  The patient states that she was turning and she thinks she tripped over her cane.  Fell onto her right knee and hit her forehead.  Denies any loss of consciousness. She has R knee pain. She and her facility feel that she is not able to care for herself independently if discharged home and PT consult was ordered to help with discharge planning. No fracture found on xray to R knee. No acute fracture or traumatic subluxation of cervical spine on CT scan. No acute intracranial abnormality or scull fracture with head CT. She was diagnosed with fall, knee effusion, and hematoma.   Clinical Impression  Patient received in chair and agreeable to PT evaluation. Patient is pleasant and appears awake and oriented x 4. She reports she resides in the independent living section of [facility]. Prior to hospitalization she was mod I with mobility using rollator and was mod I with ADLs. She did drive at times and got assistance with housecleaning. She states there is no one to stay with her. She has fallen twice in the last 6 months and has been going to PT at [facility] pool and gym 2x a week since the first fall to improve her back pain, strength, and fall risk. She has also been working with home health speech therapy twice a week on cognition. Upon PT evaluation, patient was able to complete bed mobility with min A to move her R LE into bed, supervision with sit <> stand transfers using RW between ED stretcher and recliner chair, and CGA to ambulation 35 feet using RW. She reported pain and demonstrated guarding behaviors with R LE weight bearing and motion of R knee that resolved with rest. She demonstrated heavy use of B UE during ambulation, that would  necessitate the stability of a RW instead of the rollator. She minimized R LE weight bearing and used step to gait pattern. She reported feeling instability in her R knee with the sense it would hyperextend if she fully extended it while weight bearing. Patient was reliant on B UE support during standing and walking activities and fatigued quickly. Patient appears to have experienced a significant decline in her functional mobility and safety and she requires assistance for safe mobility and self care at this time. Patient would benefit from skilled physical therapy to address impairments and functional limitations (see PT Problem List below) to work towards stated goals and return to PLOF or maximal functional independence.       Recommendations for follow up therapy are one component of a multi-disciplinary discharge planning process, led by the attending physician.  Recommendations may be updated based on patient status, additional functional criteria and insurance authorization.  Follow Up Recommendations Can patient physically be transported by private vehicle: Yes     Assistance Recommended at Discharge Frequent or constant Supervision/Assistance  Patient can return home with the following  A little help with walking and/or transfers;Assistance with cooking/housework;Direct supervision/assist for medications management;Assist for transportation;Help with stairs or ramp for entrance;A little help with bathing/dressing/bathroom    Equipment Recommendations Rolling walker (2 wheels)  Recommendations for Other Services       Functional Status Assessment Patient has had a recent decline in their functional status and demonstrates the ability to make significant improvements  in function in a reasonable and predictable amount of time.     Precautions / Restrictions Precautions Precautions: Fall Restrictions Weight Bearing Restrictions: No      Mobility  Bed Mobility Overal bed mobility:  Needs Assistance Bed Mobility: Supine to Sit, Sit to Supine     Supine to sit: Modified independent (Device/Increase time) Sit to supine: Min assist   General bed mobility comments: Patient needed assistance with moving R LE into the bed during supine to sit.    Transfers Overall transfer level: Needs assistance Equipment used: Rolling walker (2 wheels) Transfers: Sit to/from Stand Sit to Stand: Supervision           General transfer comment: Patient completed sit <> stand at edge of ED stretcher and ED stretcher <> chair with supervision for safety. She moved slowly and limited weight bearing on R LE.    Ambulation/Gait Ambulation/Gait assistance: Min guard Gait Distance (Feet): 35 Feet Assistive device: Rolling walker (2 wheels) Gait Pattern/deviations: Step-to pattern, Decreased stance time - right, Decreased stride length, Decreased weight shift to right, Knee flexed in stance - right Gait velocity: slow     General Gait Details: Patient ambulated slowly with RW with avoidance of weight bearing on R LE. She reported pain with ambulation and feeling of instability in her R knee like it would go into hyperextension. She fatigued quickly and was limited in how far she could ambulation. She required CGA for safety.  Stairs            Wheelchair Mobility    Modified Rankin (Stroke Patients Only)       Balance Overall balance assessment: Needs assistance Sitting-balance support: Bilateral upper extremity supported, Feet supported Sitting balance-Leahy Scale: Good Sitting balance - Comments: steady sitting at edge of ED stretcher and chair with feet on the ground   Standing balance support: Bilateral upper extremity supported, During functional activity, Reliant on assistive device for balance Standing balance-Leahy Scale: Poor Standing balance comment: Patient with heavy B UE support on RW during standing and ambulation. Needs stability of RW vs rollator.                              Pertinent Vitals/Pain Pain Assessment Pain Assessment: Faces Faces Pain Scale: Hurts whole lot Pain Location: R knee with weight bearing and movement of R knee and hip. no pain at rest Pain Descriptors / Indicators: Discomfort, Guarding, Grimacing, Moaning Pain Intervention(s): Limited activity within patient's tolerance, Monitored during session, Repositioned    Home Living Family/patient expects to be discharged to:: Private residence (Independent living at the [facility]) Living Arrangements: Alone Available Help at Discharge:  (no one can stay with her) Type of Home: Independent living facility         Home Layout: One level Home Equipment: Grab bars - tub/shower;Grab bars - toilet;Cane - single point;Shower seat;Rollator (4 wheels) Additional Comments: [facility] indepenedent living section    Prior Function Prior Level of Function : Independent/Modified Independent;Driving;History of Falls (last six months)             Mobility Comments: Ambulates mod I with rollator. Two falls in the last 6 months. She has been going to PT at [facility] and gym 2x a week since first fall to improve balance and strength and improve low back pain. ADLs Comments: Patient reports she is I to mod I for ADLs. She drives occasionally. [facility] provides  housecleaning. She has been working with home health speech therapy 2x a week on cognition to improve her medication management, name recall, and calander use.     Hand Dominance   Dominant Hand: Right    Extremity/Trunk Assessment   Upper Extremity Assessment Upper Extremity Assessment: Generalized weakness    Lower Extremity Assessment Lower Extremity Assessment: Generalized weakness;RLE deficits/detail RLE Deficits / Details: R ankle DF 4+/5, R knee flexion/extension unable to fully assess due to pain, R hip flexion 3+/5 with pain. RLE: Unable to fully assess due to  pain RLE Sensation: WNL    Cervical / Trunk Assessment Cervical / Trunk Assessment: Normal  Communication   Communication: No difficulties  Cognition Arousal/Alertness: Awake/alert Behavior During Therapy: WFL for tasks assessed/performed Overall Cognitive Status: Within Functional Limits for tasks assessed                                          General Comments General comments (skin integrity, edema, etc.): swellin/effusion noted at right knee.    Exercises Other Exercises Other Exercises: educated patient about role of PT in acute care setting, discharge reccomendations, DME reccomendations, importance of mobility and suggestion of cold pack for R knee.   Assessment/Plan    PT Assessment Patient needs continued PT services  PT Problem List Decreased strength;Decreased balance;Pain;Decreased range of motion;Decreased mobility;Decreased knowledge of use of DME;Decreased activity tolerance;Decreased skin integrity       PT Treatment Interventions Functional mobility training;DME instruction;Balance training;Patient/family education;Gait training;Therapeutic activities;Neuromuscular re-education;Therapeutic exercise    PT Goals (Current goals can be found in the Care Plan section)  Acute Rehab PT Goals Patient Stated Goal: go to rehab to get stronger before returning home PT Goal Formulation: With patient Time For Goal Achievement: 06/08/23 Potential to Achieve Goals: Good    Frequency Min 4X/week     Co-evaluation               AM-PAC PT "6 Clicks" Mobility  Outcome Measure Help needed turning from your back to your side while in a flat bed without using bedrails?: None Help needed moving from lying on your back to sitting on the side of a flat bed without using bedrails?: None Help needed moving to and from a bed to a chair (including a wheelchair)?: A Little Help needed standing up from a chair using your arms (e.g., wheelchair or bedside  chair)?: A Little Help needed to walk in hospital room?: A Little Help needed climbing 3-5 steps with a railing? : Total 6 Click Score: 18    End of Session Equipment Utilized During Treatment: Gait belt Activity Tolerance: Patient limited by pain;Patient limited by fatigue Patient left: in chair (Patient room is the hallway. Unable to position chair next to call button due to configuration of hallway and the size of the stretcher in the hallway. Pateint's belongings within reach and she is able to visualize nursing station.) Nurse Communication: Mobility status PT Visit Diagnosis: History of falling (Z91.81);Muscle weakness (generalized) (M62.81);Difficulty in walking, not elsewhere classified (R26.2);Pain Pain - Right/Left: Right Pain - part of body: Knee    Time: 1610-9604 PT Time Calculation (min) (ACUTE ONLY): 25 min   Charges:   PT Evaluation $PT Eval Low Complexity: 1 Low PT Treatments $Therapeutic Activity: 8-22 mins        Luretha Murphy. Ilsa Iha, PT, DPT 05/25/23, 9:49 AM

## 2023-05-27 ENCOUNTER — Telehealth: Payer: Self-pay | Admitting: Nurse Practitioner

## 2023-05-27 NOTE — Telephone Encounter (Signed)
FYI for incoming paperwork

## 2023-05-27 NOTE — Telephone Encounter (Signed)
Form completed and faxed back to [facility] as requested.   Called and let Tabitha know that I have faxed the forms back to her.

## 2023-05-27 NOTE — Telephone Encounter (Signed)
Copied from CRM [id number]. Topic: General - Other >> May 27, 2023  9:39 AM Epimenio Foot F wrote: Reason for CRM: Tabitha , a nurse at [facility] of [facility] is calling in because pt had a fall and they would like to move the pt to their Rehab floor but they need an FL2 filled and signed by Clydie Braun. Wyatt Mage says she will send the paperwork over via fax and would like it back via fax too.

## 2023-05-27 NOTE — Telephone Encounter (Signed)
Form completed.

## 2023-05-28 DIAGNOSIS — I5032 Chronic diastolic (congestive) heart failure: Secondary | ICD-10-CM | POA: Diagnosis not present

## 2023-05-28 DIAGNOSIS — F320 Major depressive disorder, single episode, mild: Secondary | ICD-10-CM | POA: Diagnosis not present

## 2023-05-28 DIAGNOSIS — I4892 Unspecified atrial flutter: Secondary | ICD-10-CM | POA: Diagnosis not present

## 2023-05-28 DIAGNOSIS — G301 Alzheimer's disease with late onset: Secondary | ICD-10-CM | POA: Diagnosis not present

## 2023-05-28 DIAGNOSIS — E782 Mixed hyperlipidemia: Secondary | ICD-10-CM | POA: Diagnosis not present

## 2023-05-28 DIAGNOSIS — W19XXXA Unspecified fall, initial encounter: Secondary | ICD-10-CM | POA: Diagnosis not present

## 2023-05-28 DIAGNOSIS — I4891 Unspecified atrial fibrillation: Secondary | ICD-10-CM | POA: Diagnosis not present

## 2023-05-28 DIAGNOSIS — E1169 Type 2 diabetes mellitus with other specified complication: Secondary | ICD-10-CM | POA: Diagnosis not present

## 2023-05-28 DIAGNOSIS — Y92009 Unspecified place in unspecified non-institutional (private) residence as the place of occurrence of the external cause: Secondary | ICD-10-CM | POA: Diagnosis not present

## 2023-05-29 ENCOUNTER — Telehealth: Payer: Self-pay

## 2023-05-29 ENCOUNTER — Ambulatory Visit: Payer: Medicare PPO | Admitting: Podiatry

## 2023-05-29 NOTE — Telephone Encounter (Signed)
Transition Care Management Unsuccessful Follow-up Telephone Call  Date of discharge and from where:  [facility] 6/1  Attempts:  1st Attempt  Reason for unsuccessful TCM follow-up call:  Unable to leave message   Dejuana Weist [facility], [place] [phone] [address], [place], [place] [address] Phone: [phone] Email: [email or web address]       

## 2023-05-30 ENCOUNTER — Telehealth: Payer: Self-pay

## 2023-05-30 DIAGNOSIS — M25512 Pain in left shoulder: Secondary | ICD-10-CM | POA: Diagnosis not present

## 2023-05-30 DIAGNOSIS — W19XXXA Unspecified fall, initial encounter: Secondary | ICD-10-CM | POA: Diagnosis not present

## 2023-05-30 DIAGNOSIS — Y92009 Unspecified place in unspecified non-institutional (private) residence as the place of occurrence of the external cause: Secondary | ICD-10-CM | POA: Diagnosis not present

## 2023-05-30 NOTE — Telephone Encounter (Signed)
Transition Care Management Unsuccessful Follow-up Telephone Call  Date of discharge and from where:  [place] 6/1  Attempts:  2nd Attempt  Reason for unsuccessful TCM follow-up call:  Left voice message   Alison Richardson [facility], [place] [phone] [address], [place], [place] [address] Phone: [phone] Email: [email or web address]       

## 2023-05-31 DIAGNOSIS — M25512 Pain in left shoulder: Secondary | ICD-10-CM | POA: Diagnosis not present

## 2023-06-03 ENCOUNTER — Ambulatory Visit: Payer: Medicare PPO | Admitting: Nurse Practitioner

## 2023-06-03 NOTE — Progress Notes (Deleted)
There were no vitals taken for this visit.   Subjective:    Patient ID: Alison Richardson, female    DOB: 1933/06/21, 89 y.o.   MRN: [id number]  HPI: Alison Richardson is a 89 y.o. female  No chief complaint on file.  HYPERTENSION with Chronic Kidney Disease Patient states she is feeling better today.  States she is feeling much better than last week.  Fatigue and SOB have improve.d   Hypertension status: controlled Satisfied with current treatment? yes Duration of hypertension: years BP monitoring frequency:  daily BP range: 140-160/90 BP medication side effects:  no Medication compliance: excellent compliance Previous BP meds:valsartan Aspirin: no Recurrent headaches: no Visual changes: no Palpitations: no Dyspnea: no Chest pain: no Lower extremity edema: no Dizzy/lightheaded: a little bit Feeling unsteady on her feet  BACK PAIN Patient states she is having a pain in her back.  She took extra strength tylenol which does help with her pain.  She woke up this morning with the pain.   Relevant past medical, surgical, family and social history reviewed and updated as indicated. Interim medical history since our last visit reviewed. Allergies and medications reviewed and updated.  Review of Systems  Per HPI unless specifically indicated above     Objective:    There were no vitals taken for this visit.  Wt Readings from Last 3 Encounters:  05/24/23 175 lb (79.4 kg)  05/21/23 172 lb (78 kg)  05/17/23 169 lb 9.6 oz (76.9 kg)    Physical Exam Vitals and nursing note reviewed.  Constitutional:      General: She is not in acute distress.    Appearance: Normal appearance. She is normal weight. She is not ill-appearing, toxic-appearing or diaphoretic.  HENT:     Head: Normocephalic.     Right Ear: External ear normal.     Left Ear: External ear normal.     Ears:     Comments: Ear wax in right ear    Nose: Nose normal.     Mouth/Throat:     Mouth: Mucous membranes are  moist.     Pharynx: Oropharynx is clear.  Eyes:     General:        Right eye: No discharge.        Left eye: No discharge.     Extraocular Movements: Extraocular movements intact.     Conjunctiva/sclera: Conjunctivae normal.     Pupils: Pupils are equal, round, and reactive to light.  Cardiovascular:     Rate and Rhythm: Normal rate. Rhythm irregular.     Heart sounds: No murmur heard. Pulmonary:     Effort: Pulmonary effort is normal. No respiratory distress.     Breath sounds: Normal breath sounds. No wheezing or rales.  Musculoskeletal:     Cervical back: Normal range of motion and neck supple.  Skin:    General: Skin is warm and dry.     Capillary Refill: Capillary refill takes less than 2 seconds.  Neurological:     General: No focal deficit present.     Mental Status: She is alert and oriented to person, place, and time. Mental status is at baseline.  Psychiatric:        Mood and Affect: Mood normal.        Behavior: Behavior normal.        Thought Content: Thought content normal.        Judgment: Judgment normal.     Results for orders placed or performed during  the hospital encounter of 05/24/23  CBC  Result Value Ref Range   WBC 9.2 4.0 - 10.5 K/uL   RBC 5.10 3.87 - 5.11 MIL/uL   Hemoglobin 13.1 12.0 - 15.0 g/dL   HCT 19.1 47.8 - 29.5 %   MCV 83.1 80.0 - 100.0 fL   MCH 25.7 (L) 26.0 - 34.0 pg   MCHC 30.9 30.0 - 36.0 g/dL   RDW 62.1 (H) 30.8 - 65.7 %   Platelets 392 150 - 400 K/uL   nRBC 0.0 0.0 - 0.2 %  Basic metabolic panel  Result Value Ref Range   Sodium 140 135 - 145 mmol/L   Potassium 3.2 (L) 3.5 - 5.1 mmol/L   Chloride 104 98 - 111 mmol/L   CO2 22 22 - 32 mmol/L   Glucose, Bld 201 (H) 70 - 99 mg/dL   BUN 13 8 - 23 mg/dL   Creatinine, Ser 8.46 0.44 - 1.00 mg/dL   Calcium 8.9 8.9 - 96.2 mg/dL   GFR, Estimated >95 >28 mL/min   Anion gap 14 5 - 15      Assessment & Plan:   Problem List Items Addressed This Visit   None    Follow up plan: No  follow-ups on file.

## 2023-06-04 DIAGNOSIS — Z96652 Presence of left artificial knee joint: Secondary | ICD-10-CM | POA: Diagnosis not present

## 2023-06-04 DIAGNOSIS — M1711 Unilateral primary osteoarthritis, right knee: Secondary | ICD-10-CM | POA: Diagnosis not present

## 2023-06-05 DIAGNOSIS — M25461 Effusion, right knee: Secondary | ICD-10-CM | POA: Diagnosis not present

## 2023-06-05 DIAGNOSIS — R2689 Other abnormalities of gait and mobility: Secondary | ICD-10-CM | POA: Diagnosis not present

## 2023-06-05 DIAGNOSIS — M6281 Muscle weakness (generalized): Secondary | ICD-10-CM | POA: Diagnosis not present

## 2023-06-05 DIAGNOSIS — Z9181 History of falling: Secondary | ICD-10-CM | POA: Diagnosis not present

## 2023-06-05 DIAGNOSIS — M25512 Pain in left shoulder: Secondary | ICD-10-CM | POA: Diagnosis not present

## 2023-06-05 DIAGNOSIS — R488 Other symbolic dysfunctions: Secondary | ICD-10-CM | POA: Diagnosis not present

## 2023-06-05 DIAGNOSIS — G3184 Mild cognitive impairment, so stated: Secondary | ICD-10-CM | POA: Diagnosis not present

## 2023-06-07 ENCOUNTER — Inpatient Hospital Stay: Payer: Medicare PPO | Admitting: Adult Health

## 2023-06-07 DIAGNOSIS — M25461 Effusion, right knee: Secondary | ICD-10-CM | POA: Diagnosis not present

## 2023-06-07 DIAGNOSIS — R2681 Unsteadiness on feet: Secondary | ICD-10-CM | POA: Diagnosis not present

## 2023-06-07 DIAGNOSIS — Z9181 History of falling: Secondary | ICD-10-CM | POA: Diagnosis not present

## 2023-06-07 DIAGNOSIS — M6281 Muscle weakness (generalized): Secondary | ICD-10-CM | POA: Diagnosis not present

## 2023-06-07 DIAGNOSIS — M25561 Pain in right knee: Secondary | ICD-10-CM | POA: Diagnosis not present

## 2023-06-11 ENCOUNTER — Ambulatory Visit: Payer: Medicare PPO | Admitting: Family Medicine

## 2023-06-11 ENCOUNTER — Encounter: Payer: Self-pay | Admitting: Family Medicine

## 2023-06-11 VITALS — BP 106/65 | HR 109 | Temp 97.7°F | Wt 163.6 lb

## 2023-06-11 DIAGNOSIS — S0183XA Puncture wound without foreign body of other part of head, initial encounter: Secondary | ICD-10-CM | POA: Insufficient documentation

## 2023-06-11 HISTORY — DX: Puncture wound without foreign body of other part of head, initial encounter: S01.83XA

## 2023-06-11 NOTE — Assessment & Plan Note (Signed)
Acute, ongoing. Continue use of Vaseline and Neosporin, keep wound covered with bandaid. F/u sooner if signs of infection start to occur, such as redness, excess drainage, fever, chills, or pain to the area.

## 2023-06-11 NOTE — Patient Instructions (Signed)
Continue Vaseline or Neosporin Keep wound covered

## 2023-06-11 NOTE — Progress Notes (Signed)
BP 106/65   Pulse (!) 109   Temp 97.7 F (36.5 C) (Oral)   Wt 163 lb 9.6 oz (74.2 kg)   SpO2 97%   BMI 26.41 kg/m    Subjective:    Patient ID: Alison Richardson, female    DOB: 05-04-1933, 89 y.o.   MRN: [id number]  HPI: Alison Richardson is a 89 y.o. female  Chief Complaint  Patient presents with   Fall    Pt states she had a fall on 05/24/23, went to the ED and was checked out. States she still has a wound on her forehead that she would like checked because she feels as if it is not healing.    Hospital obtained CT; findings included: 1. Midline frontal scalp hematoma. No acute intracranial  abnormality. No skull fracture.  2. Stable atrophy and chronic small vessel ischemia.  3. Unchanged subcentimeter left frontal convexity meningioma.  4. 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 Open wound to forehead, 2-3 cm in size with tunneled look. Patient concerned about appearance of "hole in forehead" and concerned if stitches could be done. She denies pain, drainage, fever, and chills.  WOUND ON FOREHEAD Duration: 18 days Location: Center of forehead Painful: no Onset: sudden with fall Context: changing, healing Associated signs and symptoms: None  Right knee pain from fall still exist mostly with walking, she is taking extra strength Tylenol every 5 hours and this provides relief.  Relevant past medical, surgical, family and social history reviewed and updated as indicated. Interim medical history since our last visit reviewed. Allergies and medications reviewed and updated.  Review of Systems  Respiratory: Negative.    Cardiovascular: Negative.   Skin:  Positive for color change and wound.       Puncture wound center of forehead, bilateral bruising above cheek bone    Per HPI unless specifically indicated above     Objective:    BP 106/65    Pulse (!) 109   Temp 97.7 F (36.5 C) (Oral)   Wt 163 lb 9.6 oz (74.2 kg)   SpO2 97%   BMI 26.41 kg/m   Wt Readings from Last 3 Encounters:  06/11/23 163 lb 9.6 oz (74.2 kg)  05/24/23 175 lb (79.4 kg)  05/21/23 172 lb (78 kg)    Physical Exam Vitals and nursing note reviewed.  Constitutional:      General: She is awake. She is not in acute distress.    Appearance: Normal appearance. She is well-developed and well-groomed. She is not ill-appearing.  HENT:     Head: Normocephalic and atraumatic.     Right Ear: Hearing and external ear normal. No drainage.     Left Ear: Hearing and external ear normal. No drainage.     Nose: Nose normal.  Eyes:     General: Lids are normal.        Right eye: No discharge.        Left eye: No discharge.     Conjunctiva/sclera: Conjunctivae normal.  Cardiovascular:     Rate and Rhythm: Tachycardia present. Rhythm irregular.     Pulses:          Radial pulses are 2+ on the right side and 2+ on the left side.       Posterior tibial pulses are 2+ on the right side and 2+ on the left  side.     Heart sounds: Normal heart sounds, S1 normal and S2 normal. No murmur heard.    No gallop.     Comments: Hx of Afib Pulmonary:     Effort: Pulmonary effort is normal. No accessory muscle usage or respiratory distress.     Breath sounds: Normal breath sounds.  Musculoskeletal:        General: Normal range of motion.     Cervical back: Full passive range of motion without pain and normal range of motion.     Right lower leg: No edema.     Left lower leg: No edema.  Skin:    General: Skin is warm and dry.     Capillary Refill: Capillary refill takes less than 2 seconds.     Findings: Bruising and wound present.          Comments: Bilateral bruising above cheek bone  Neurological:     Mental Status: She is alert and oriented to person, place, and time.  Psychiatric:        Attention and Perception: Attention normal.        Mood and Affect: Mood normal.         Speech: Speech normal.        Behavior: Behavior normal. Behavior is cooperative.        Thought Content: Thought content normal.     Results for orders placed or performed during the hospital encounter of 05/24/23  CBC  Result Value Ref Range   WBC 9.2 4.0 - 10.5 K/uL   RBC 5.10 3.87 - 5.11 MIL/uL   Hemoglobin 13.1 12.0 - 15.0 g/dL   HCT 16.1 09.6 - 04.5 %   MCV 83.1 80.0 - 100.0 fL   MCH 25.7 (L) 26.0 - 34.0 pg   MCHC 30.9 30.0 - 36.0 g/dL   RDW 40.9 (H) 81.1 - 91.4 %   Platelets 392 150 - 400 K/uL   nRBC 0.0 0.0 - 0.2 %  Basic metabolic panel  Result Value Ref Range   Sodium 140 135 - 145 mmol/L   Potassium 3.2 (L) 3.5 - 5.1 mmol/L   Chloride 104 98 - 111 mmol/L   CO2 22 22 - 32 mmol/L   Glucose, Bld 201 (H) 70 - 99 mg/dL   BUN 13 8 - 23 mg/dL   Creatinine, Ser 7.82 0.44 - 1.00 mg/dL   Calcium 8.9 8.9 - 95.6 mg/dL   GFR, Estimated >21 >30 mL/min   Anion gap 14 5 - 15      Assessment & Plan:   Problem List Items Addressed This Visit     Puncture wound of forehead - Primary    Acute, ongoing. Continue use of Vaseline and Neosporin, keep wound covered with bandaid. F/u sooner if signs of infection start to occur, such as redness, excess drainage, fever, chills, or pain to the area.         Follow up plan: Return in about 10 days (around 06/21/2023) for Follow up, HR.

## 2023-06-17 DIAGNOSIS — Z96652 Presence of left artificial knee joint: Secondary | ICD-10-CM | POA: Diagnosis not present

## 2023-06-17 DIAGNOSIS — M1711 Unilateral primary osteoarthritis, right knee: Secondary | ICD-10-CM | POA: Diagnosis not present

## 2023-06-17 DIAGNOSIS — R269 Unspecified abnormalities of gait and mobility: Secondary | ICD-10-CM | POA: Diagnosis not present

## 2023-06-18 DIAGNOSIS — G3184 Mild cognitive impairment, so stated: Secondary | ICD-10-CM | POA: Diagnosis not present

## 2023-06-18 DIAGNOSIS — R488 Other symbolic dysfunctions: Secondary | ICD-10-CM | POA: Diagnosis not present

## 2023-06-19 DIAGNOSIS — M25512 Pain in left shoulder: Secondary | ICD-10-CM | POA: Diagnosis not present

## 2023-06-19 DIAGNOSIS — M25461 Effusion, right knee: Secondary | ICD-10-CM | POA: Diagnosis not present

## 2023-06-19 DIAGNOSIS — M25561 Pain in right knee: Secondary | ICD-10-CM | POA: Diagnosis not present

## 2023-06-19 DIAGNOSIS — R488 Other symbolic dysfunctions: Secondary | ICD-10-CM | POA: Diagnosis not present

## 2023-06-19 DIAGNOSIS — M6281 Muscle weakness (generalized): Secondary | ICD-10-CM | POA: Diagnosis not present

## 2023-06-19 DIAGNOSIS — R2681 Unsteadiness on feet: Secondary | ICD-10-CM | POA: Diagnosis not present

## 2023-06-19 DIAGNOSIS — G3184 Mild cognitive impairment, so stated: Secondary | ICD-10-CM | POA: Diagnosis not present

## 2023-06-19 DIAGNOSIS — Z9181 History of falling: Secondary | ICD-10-CM | POA: Diagnosis not present

## 2023-06-19 DIAGNOSIS — R2689 Other abnormalities of gait and mobility: Secondary | ICD-10-CM | POA: Diagnosis not present

## 2023-06-21 ENCOUNTER — Ambulatory Visit: Payer: Medicare PPO | Admitting: Nurse Practitioner

## 2023-06-21 DIAGNOSIS — M25512 Pain in left shoulder: Secondary | ICD-10-CM | POA: Diagnosis not present

## 2023-06-21 DIAGNOSIS — R2689 Other abnormalities of gait and mobility: Secondary | ICD-10-CM | POA: Diagnosis not present

## 2023-06-21 DIAGNOSIS — M6281 Muscle weakness (generalized): Secondary | ICD-10-CM | POA: Diagnosis not present

## 2023-06-21 DIAGNOSIS — Z9181 History of falling: Secondary | ICD-10-CM | POA: Diagnosis not present

## 2023-06-21 DIAGNOSIS — M25461 Effusion, right knee: Secondary | ICD-10-CM | POA: Diagnosis not present

## 2023-06-21 NOTE — Progress Notes (Deleted)
There were no vitals taken for this visit.   Subjective:    Patient ID: Alison Richardson, female    DOB: 12/05/1933, 89 y.o.   MRN: [id number]  HPI: Alison Richardson is a 89 y.o. female  No chief complaint on file.  HYPERTENSION with Chronic Kidney Disease Patient states she is feeling better today.  States she is feeling much better than last week.  Fatigue and SOB have improve.d   Hypertension status: controlled Satisfied with current treatment? yes Duration of hypertension: years BP monitoring frequency:  daily BP range: 140-160/90 BP medication side effects:  no Medication compliance: excellent compliance Previous BP meds:valsartan Aspirin: no Recurrent headaches: no Visual changes: no Palpitations: no Dyspnea: no Chest pain: no Lower extremity edema: no Dizzy/lightheaded: a little bit Feeling unsteady on her feet  BACK PAIN Patient states she is having a pain in her back.  She took extra strength tylenol which does help with her pain.  She woke up this morning with the pain.   Relevant past medical, surgical, family and social history reviewed and updated as indicated. Interim medical history since our last visit reviewed. Allergies and medications reviewed and updated.  Review of Systems  Per HPI unless specifically indicated above     Objective:    There were no vitals taken for this visit.  Wt Readings from Last 3 Encounters:  06/11/23 163 lb 9.6 oz (74.2 kg)  05/24/23 175 lb (79.4 kg)  05/21/23 172 lb (78 kg)    Physical Exam Vitals and nursing note reviewed.  Constitutional:      General: She is not in acute distress.    Appearance: Normal appearance. She is normal weight. She is not ill-appearing, toxic-appearing or diaphoretic.  HENT:     Head: Normocephalic.     Right Ear: External ear normal.     Left Ear: External ear normal.     Ears:     Comments: Ear wax in right ear    Nose: Nose normal.     Mouth/Throat:     Mouth: Mucous membranes are  moist.     Pharynx: Oropharynx is clear.  Eyes:     General:        Right eye: No discharge.        Left eye: No discharge.     Extraocular Movements: Extraocular movements intact.     Conjunctiva/sclera: Conjunctivae normal.     Pupils: Pupils are equal, round, and reactive to light.  Cardiovascular:     Rate and Rhythm: Normal rate. Rhythm irregular.     Heart sounds: No murmur heard. Pulmonary:     Effort: Pulmonary effort is normal. No respiratory distress.     Breath sounds: Normal breath sounds. No wheezing or rales.  Musculoskeletal:     Cervical back: Normal range of motion and neck supple.  Skin:    General: Skin is warm and dry.     Capillary Refill: Capillary refill takes less than 2 seconds.  Neurological:     General: No focal deficit present.     Mental Status: She is alert and oriented to person, place, and time. Mental status is at baseline.  Psychiatric:        Mood and Affect: Mood normal.        Behavior: Behavior normal.        Thought Content: Thought content normal.        Judgment: Judgment normal.    Results for orders placed or performed during the  hospital encounter of 05/24/23  CBC  Result Value Ref Range   WBC 9.2 4.0 - 10.5 K/uL   RBC 5.10 3.87 - 5.11 MIL/uL   Hemoglobin 13.1 12.0 - 15.0 g/dL   HCT 40.9 81.1 - 91.4 %   MCV 83.1 80.0 - 100.0 fL   MCH 25.7 (L) 26.0 - 34.0 pg   MCHC 30.9 30.0 - 36.0 g/dL   RDW 78.2 (H) 95.6 - 21.3 %   Platelets 392 150 - 400 K/uL   nRBC 0.0 0.0 - 0.2 %  Basic metabolic panel  Result Value Ref Range   Sodium 140 135 - 145 mmol/L   Potassium 3.2 (L) 3.5 - 5.1 mmol/L   Chloride 104 98 - 111 mmol/L   CO2 22 22 - 32 mmol/L   Glucose, Bld 201 (H) 70 - 99 mg/dL   BUN 13 8 - 23 mg/dL   Creatinine, Ser 0.86 0.44 - 1.00 mg/dL   Calcium 8.9 8.9 - 57.8 mg/dL   GFR, Estimated >46 >96 mL/min   Anion gap 14 5 - 15      Assessment & Plan:   Problem List Items Addressed This Visit   None    Follow up plan: No  follow-ups on file.

## 2023-06-24 ENCOUNTER — Ambulatory Visit: Payer: Medicare PPO | Admitting: Physician Assistant

## 2023-06-24 DIAGNOSIS — I4892 Unspecified atrial flutter: Secondary | ICD-10-CM | POA: Diagnosis not present

## 2023-06-24 DIAGNOSIS — R2689 Other abnormalities of gait and mobility: Secondary | ICD-10-CM | POA: Diagnosis not present

## 2023-06-24 DIAGNOSIS — M25461 Effusion, right knee: Secondary | ICD-10-CM | POA: Diagnosis not present

## 2023-06-24 DIAGNOSIS — Z9181 History of falling: Secondary | ICD-10-CM | POA: Diagnosis not present

## 2023-06-24 DIAGNOSIS — R2681 Unsteadiness on feet: Secondary | ICD-10-CM | POA: Diagnosis not present

## 2023-06-24 DIAGNOSIS — I4891 Unspecified atrial fibrillation: Secondary | ICD-10-CM | POA: Diagnosis not present

## 2023-06-24 DIAGNOSIS — M6281 Muscle weakness (generalized): Secondary | ICD-10-CM | POA: Diagnosis not present

## 2023-06-24 DIAGNOSIS — M25561 Pain in right knee: Secondary | ICD-10-CM | POA: Diagnosis not present

## 2023-06-24 DIAGNOSIS — M25512 Pain in left shoulder: Secondary | ICD-10-CM | POA: Diagnosis not present

## 2023-06-25 DIAGNOSIS — G3184 Mild cognitive impairment, so stated: Secondary | ICD-10-CM | POA: Diagnosis not present

## 2023-06-25 DIAGNOSIS — R488 Other symbolic dysfunctions: Secondary | ICD-10-CM | POA: Diagnosis not present

## 2023-06-26 DIAGNOSIS — E7800 Pure hypercholesterolemia, unspecified: Secondary | ICD-10-CM | POA: Diagnosis not present

## 2023-06-26 DIAGNOSIS — I4891 Unspecified atrial fibrillation: Secondary | ICD-10-CM | POA: Diagnosis not present

## 2023-06-26 DIAGNOSIS — R2681 Unsteadiness on feet: Secondary | ICD-10-CM | POA: Diagnosis not present

## 2023-06-26 DIAGNOSIS — G4733 Obstructive sleep apnea (adult) (pediatric): Secondary | ICD-10-CM | POA: Diagnosis not present

## 2023-06-26 DIAGNOSIS — G301 Alzheimer's disease with late onset: Secondary | ICD-10-CM | POA: Diagnosis not present

## 2023-06-26 DIAGNOSIS — Z9181 History of falling: Secondary | ICD-10-CM | POA: Diagnosis not present

## 2023-06-26 DIAGNOSIS — I739 Peripheral vascular disease, unspecified: Secondary | ICD-10-CM | POA: Diagnosis not present

## 2023-06-26 DIAGNOSIS — M25512 Pain in left shoulder: Secondary | ICD-10-CM | POA: Diagnosis not present

## 2023-06-26 DIAGNOSIS — M6281 Muscle weakness (generalized): Secondary | ICD-10-CM | POA: Diagnosis not present

## 2023-06-26 DIAGNOSIS — I2510 Atherosclerotic heart disease of native coronary artery without angina pectoris: Secondary | ICD-10-CM | POA: Diagnosis not present

## 2023-06-26 DIAGNOSIS — I471 Supraventricular tachycardia, unspecified: Secondary | ICD-10-CM | POA: Diagnosis not present

## 2023-06-26 DIAGNOSIS — I2781 Cor pulmonale (chronic): Secondary | ICD-10-CM | POA: Diagnosis not present

## 2023-06-26 DIAGNOSIS — R2689 Other abnormalities of gait and mobility: Secondary | ICD-10-CM | POA: Diagnosis not present

## 2023-06-26 DIAGNOSIS — M25561 Pain in right knee: Secondary | ICD-10-CM | POA: Diagnosis not present

## 2023-06-26 DIAGNOSIS — M25461 Effusion, right knee: Secondary | ICD-10-CM | POA: Diagnosis not present

## 2023-06-26 DIAGNOSIS — I5032 Chronic diastolic (congestive) heart failure: Secondary | ICD-10-CM | POA: Diagnosis not present

## 2023-07-01 ENCOUNTER — Encounter: Payer: Medicare PPO | Admitting: Family

## 2023-07-12 DIAGNOSIS — Z9181 History of falling: Secondary | ICD-10-CM | POA: Diagnosis not present

## 2023-07-12 DIAGNOSIS — R2681 Unsteadiness on feet: Secondary | ICD-10-CM | POA: Diagnosis not present

## 2023-07-12 DIAGNOSIS — R2689 Other abnormalities of gait and mobility: Secondary | ICD-10-CM | POA: Diagnosis not present

## 2023-07-12 DIAGNOSIS — M25512 Pain in left shoulder: Secondary | ICD-10-CM | POA: Diagnosis not present

## 2023-07-12 DIAGNOSIS — M6281 Muscle weakness (generalized): Secondary | ICD-10-CM | POA: Diagnosis not present

## 2023-07-12 DIAGNOSIS — M25461 Effusion, right knee: Secondary | ICD-10-CM | POA: Diagnosis not present

## 2023-07-12 DIAGNOSIS — M25561 Pain in right knee: Secondary | ICD-10-CM | POA: Diagnosis not present

## 2023-07-24 DIAGNOSIS — R2681 Unsteadiness on feet: Secondary | ICD-10-CM | POA: Diagnosis not present

## 2023-07-24 DIAGNOSIS — M25461 Effusion, right knee: Secondary | ICD-10-CM | POA: Diagnosis not present

## 2023-07-24 DIAGNOSIS — Z9181 History of falling: Secondary | ICD-10-CM | POA: Diagnosis not present

## 2023-07-24 DIAGNOSIS — M25512 Pain in left shoulder: Secondary | ICD-10-CM | POA: Diagnosis not present

## 2023-07-24 DIAGNOSIS — M6281 Muscle weakness (generalized): Secondary | ICD-10-CM | POA: Diagnosis not present

## 2023-07-24 DIAGNOSIS — M25561 Pain in right knee: Secondary | ICD-10-CM | POA: Diagnosis not present

## 2023-07-24 DIAGNOSIS — R2689 Other abnormalities of gait and mobility: Secondary | ICD-10-CM | POA: Diagnosis not present

## 2023-07-25 DIAGNOSIS — R488 Other symbolic dysfunctions: Secondary | ICD-10-CM | POA: Diagnosis not present

## 2023-07-25 DIAGNOSIS — G3184 Mild cognitive impairment, so stated: Secondary | ICD-10-CM | POA: Diagnosis not present

## 2023-07-26 ENCOUNTER — Other Ambulatory Visit: Payer: Self-pay | Admitting: Nurse Practitioner

## 2023-07-29 DIAGNOSIS — R2681 Unsteadiness on feet: Secondary | ICD-10-CM | POA: Diagnosis not present

## 2023-07-29 DIAGNOSIS — M6281 Muscle weakness (generalized): Secondary | ICD-10-CM | POA: Diagnosis not present

## 2023-07-29 DIAGNOSIS — M25512 Pain in left shoulder: Secondary | ICD-10-CM | POA: Diagnosis not present

## 2023-07-29 DIAGNOSIS — Z9181 History of falling: Secondary | ICD-10-CM | POA: Diagnosis not present

## 2023-07-29 DIAGNOSIS — M25561 Pain in right knee: Secondary | ICD-10-CM | POA: Diagnosis not present

## 2023-07-29 DIAGNOSIS — M25461 Effusion, right knee: Secondary | ICD-10-CM | POA: Diagnosis not present

## 2023-07-29 DIAGNOSIS — R2689 Other abnormalities of gait and mobility: Secondary | ICD-10-CM | POA: Diagnosis not present

## 2023-07-29 NOTE — Telephone Encounter (Signed)
Requested Prescriptions  Pending Prescriptions Disp Refills   venlafaxine XR (EFFEXOR-XR) 75 MG 24 hr capsule [Pharmacy Med Name: VENLAFAXINE HCL ER 75 MG CAP] 90 capsule 0    Sig: TAKE 1 CAPSULE BY MOUTH ONCE DAILY.     Psychiatry: Antidepressants - SNRI - desvenlafaxine & venlafaxine Failed - 07/26/2023  4:19 PM      Failed - Lipid Panel in normal range within the last 12 months    Cholesterol, Total  Date Value Ref Range Status  04/16/2023 211 (H) 100 - 199 mg/dL Final   LDL Chol Calc (NIH)  Date Value Ref Range Status  04/16/2023 110 (H) 0 - 99 mg/dL Final   HDL  Date Value Ref Range Status  04/16/2023 76 >39 mg/dL Final   Triglycerides  Date Value Ref Range Status  04/16/2023 145 0 - 149 mg/dL Final         Passed - Cr in normal range and within 360 days    Creatinine, Ser  Date Value Ref Range Status  05/24/2023 0.80 0.44 - 1.00 mg/dL Final         Passed - Completed PHQ-2 or PHQ-9 in the last 360 days      Passed - Last BP in normal range    BP Readings from Last 1 Encounters:  06/11/23 106/65         Passed - Valid encounter within last 6 months    Recent Outpatient Visits           1 month ago Puncture wound of forehead, initial encounter   [place] [facility] [place], Sherran Needs, NP   2 months ago Atrial fibrillation, chronic (HCC)   [place] [facility] Larae Grooms, NP   2 months ago Hypertension associated with diabetes [facility])   [place] [facility] Larae Grooms, NP   3 months ago Hospital discharge follow-up   [place] [facility] Larae Grooms, NP   3 months ago Atrial fibrillation, chronic [facility])   [place] [facility] Larae Grooms, NP               valsartan (DIOVAN) 40 MG tablet [Pharmacy Med Name: VALSARTAN 40 MG TAB] 90 tablet 0    Sig: TAKE ONE TABLET BY MOUTH EVERY DAY     Cardiovascular:  Angiotensin Receptor Blockers Failed -  07/26/2023  4:19 PM      Failed - K in normal range and within 180 days    Potassium  Date Value Ref Range Status  05/24/2023 3.2 (L) 3.5 - 5.1 mmol/L Final         Passed - Cr in normal range and within 180 days    Creatinine, Ser  Date Value Ref Range Status  05/24/2023 0.80 0.44 - 1.00 mg/dL Final         Passed - Patient is not pregnant      Passed - Last BP in normal range    BP Readings from Last 1 Encounters:  06/11/23 106/65         Passed - Valid encounter within last 6 months    Recent Outpatient Visits           1 month ago Puncture wound of forehead, initial encounter   [place] [facility] [place], Sherran Needs, NP   2 months ago Atrial fibrillation, chronic [facility])   [place] [facility] Lar[facility] Grooms, NP   2 months ago Hypertension associated with  diabetes [facility])   [place] [facility] Larae Grooms, NP   3 months ago Hospital discharge follow-up   [place] [facility] Larae Grooms, NP   3 months ago Atrial fibrillation, chronic [facility])   [place] [facility] Grooms, NP               gabapentin (NEURONTIN) 100 MG capsule [Pharmacy Med Name: GABAPENTIN 100 MG CAP] 90 capsule 0    Sig: TAKE 1 CAPSULE BY MOUTH DAILY     Neurology: Anticonvulsants - gabapentin Passed - 07/26/2023  4:19 PM      Passed - Cr in normal range and within 360 days    Creatinine, Ser  Date Value Ref Range Status  05/24/2023 0.80 0.44 - 1.00 mg/dL Final         Passed - Completed PHQ-2 or PHQ-9 in the last 360 days      Passed - Valid encounter within last 12 months    Recent Outpatient Visits           1 month ago Puncture wound of forehead, initial encounter   [place] [facility] [place], Sherran Needs, NP   2 months ago Atrial fibrillation, chronic (HCC)   [place] [facility] Larae Grooms, NP   2 months ago Hypertension  associated with diabetes [facility])   [place] [facility] Lar[facility] Grooms, NP   3 months ago Hospital discharge follow-up   [place] [facility] Larae Grooms, NP   3 months ago Atrial fibrillation, chronic (HCC)   [place] [facility] Larae Grooms, NP               glipiZIDE (GLUCOTROL XL) 5 MG 24 hr tablet [Pharmacy Med Name: GLIPIZIDE ER 5 MG TAB] 90 tablet 0    Sig: TAKE 1 TABLET BY MOUTH DAILY     Endocrinology:  Diabetes - Sulfonylureas Passed - 07/26/2023  4:19 PM      Passed - HBA1C is between 0 and 7.9 and within 180 days    Hgb A1c MFr Bld  Date Value Ref Range Status  03/13/2023 7.5 (H) 4.8 - 5.6 % Final    Comment:             Prediabetes: 5.7 - 6.4          Diabetes: >6.4          Glycemic control for adults with diabetes: <7.0          Passed - Cr in normal range and within 360 days    Creatinine, Ser  Date Value Ref Range Status  05/24/2023 0.80 0.44 - 1.00 mg/dL Final         Passed - Valid encounter within last 6 months    Recent Outpatient Visits           1 month ago Puncture wound of forehead, initial encounter   [place] [facility] [place], Sherran Needs, NP   2 months ago Atrial fibrillation, chronic [facility])   [place] [facility] Larae Grooms, NP   2 months ago Hypertension associated with diabetes [facility])   [place] [facility] Larae Grooms, NP   3 months ago Hospital discharge follow-up    [facility] Larae Grooms, NP   3 months ago Atrial fibrillation, chronic [facility])    [facility] Larae Grooms, NP  levothyroxine (SYNTHROID) 88 MCG tablet [Pharmacy Med Name: LEVOTHYROXINE SODIUM 88 MCG TAB] 90 tablet 0    Sig: TAKE 1 TABLET BY MOUTH DAILY.     Endocrinology:  Hypothyroid Agents Passed - 07/26/2023  4:19 PM      Passed - TSH in normal range and within 360  days    TSH  Date Value Ref Range Status  04/16/2023 1.740 0.450 - 4.500 uIU/mL Final         Passed - Valid encounter within last 12 months    Recent Outpatient Visits           1 month ago Puncture wound of forehead, initial encounter   [place] [facility] [place], Sherran Needs, NP   2 months ago Atrial fibrillation, chronic [facility])   [place] [facility] Larae Grooms, NP   2 months ago Hypertension associated with diabetes [facility])   [place] [facility] Larae Grooms, NP   3 months ago Hospital discharge follow-up   [place] [facility] Larae Grooms, NP   3 months ago Atrial fibrillation, chronic [facility])   [place] [facility] Larae Grooms, NP

## 2023-07-31 DIAGNOSIS — M6281 Muscle weakness (generalized): Secondary | ICD-10-CM | POA: Diagnosis not present

## 2023-07-31 DIAGNOSIS — M25512 Pain in left shoulder: Secondary | ICD-10-CM | POA: Diagnosis not present

## 2023-07-31 DIAGNOSIS — R2681 Unsteadiness on feet: Secondary | ICD-10-CM | POA: Diagnosis not present

## 2023-07-31 DIAGNOSIS — M25461 Effusion, right knee: Secondary | ICD-10-CM | POA: Diagnosis not present

## 2023-07-31 DIAGNOSIS — Z9181 History of falling: Secondary | ICD-10-CM | POA: Diagnosis not present

## 2023-07-31 DIAGNOSIS — R2689 Other abnormalities of gait and mobility: Secondary | ICD-10-CM | POA: Diagnosis not present

## 2023-07-31 DIAGNOSIS — M25561 Pain in right knee: Secondary | ICD-10-CM | POA: Diagnosis not present

## 2023-08-05 ENCOUNTER — Ambulatory Visit: Payer: Medicare PPO | Admitting: Podiatry

## 2023-08-05 DIAGNOSIS — M25512 Pain in left shoulder: Secondary | ICD-10-CM | POA: Diagnosis not present

## 2023-08-05 DIAGNOSIS — R2681 Unsteadiness on feet: Secondary | ICD-10-CM | POA: Diagnosis not present

## 2023-08-05 DIAGNOSIS — M25561 Pain in right knee: Secondary | ICD-10-CM | POA: Diagnosis not present

## 2023-08-05 DIAGNOSIS — R2689 Other abnormalities of gait and mobility: Secondary | ICD-10-CM | POA: Diagnosis not present

## 2023-08-05 DIAGNOSIS — M25461 Effusion, right knee: Secondary | ICD-10-CM | POA: Diagnosis not present

## 2023-08-05 DIAGNOSIS — Z9181 History of falling: Secondary | ICD-10-CM | POA: Diagnosis not present

## 2023-08-05 DIAGNOSIS — M6281 Muscle weakness (generalized): Secondary | ICD-10-CM | POA: Diagnosis not present

## 2023-08-06 ENCOUNTER — Telehealth: Payer: Self-pay | Admitting: Student in an Organized Health Care Education/Training Program

## 2023-08-06 NOTE — Telephone Encounter (Signed)
Spoke to patient . She stated that she does not wish to follow up at this time, as her sx have improved. No current sx.  Routing to Dr. Aundria Rud as an Lorain Childes

## 2023-08-06 NOTE — Telephone Encounter (Signed)
Great, thank you for letting me know 

## 2023-08-06 NOTE — Telephone Encounter (Signed)
Pt. On recall list no longer wants to f/u

## 2023-08-07 DIAGNOSIS — M25512 Pain in left shoulder: Secondary | ICD-10-CM | POA: Diagnosis not present

## 2023-08-07 DIAGNOSIS — Z9181 History of falling: Secondary | ICD-10-CM | POA: Diagnosis not present

## 2023-08-07 DIAGNOSIS — M6281 Muscle weakness (generalized): Secondary | ICD-10-CM | POA: Diagnosis not present

## 2023-08-07 DIAGNOSIS — M25561 Pain in right knee: Secondary | ICD-10-CM | POA: Diagnosis not present

## 2023-08-07 DIAGNOSIS — R2681 Unsteadiness on feet: Secondary | ICD-10-CM | POA: Diagnosis not present

## 2023-08-07 DIAGNOSIS — M25461 Effusion, right knee: Secondary | ICD-10-CM | POA: Diagnosis not present

## 2023-08-07 DIAGNOSIS — R2689 Other abnormalities of gait and mobility: Secondary | ICD-10-CM | POA: Diagnosis not present

## 2023-08-12 DIAGNOSIS — S300XXA Contusion of lower back and pelvis, initial encounter: Secondary | ICD-10-CM | POA: Diagnosis not present

## 2023-08-14 DIAGNOSIS — R2689 Other abnormalities of gait and mobility: Secondary | ICD-10-CM | POA: Diagnosis not present

## 2023-08-14 DIAGNOSIS — Z9181 History of falling: Secondary | ICD-10-CM | POA: Diagnosis not present

## 2023-08-14 DIAGNOSIS — M6281 Muscle weakness (generalized): Secondary | ICD-10-CM | POA: Diagnosis not present

## 2023-08-14 DIAGNOSIS — R2681 Unsteadiness on feet: Secondary | ICD-10-CM | POA: Diagnosis not present

## 2023-08-14 DIAGNOSIS — M25512 Pain in left shoulder: Secondary | ICD-10-CM | POA: Diagnosis not present

## 2023-08-14 DIAGNOSIS — M25461 Effusion, right knee: Secondary | ICD-10-CM | POA: Diagnosis not present

## 2023-08-14 DIAGNOSIS — M25561 Pain in right knee: Secondary | ICD-10-CM | POA: Diagnosis not present

## 2023-08-19 ENCOUNTER — Ambulatory Visit: Payer: Medicare PPO | Admitting: Physician Assistant

## 2023-08-19 ENCOUNTER — Ambulatory Visit: Payer: Medicare PPO | Admitting: Podiatry

## 2023-08-19 DIAGNOSIS — Z9181 History of falling: Secondary | ICD-10-CM | POA: Diagnosis not present

## 2023-08-19 DIAGNOSIS — R2681 Unsteadiness on feet: Secondary | ICD-10-CM | POA: Diagnosis not present

## 2023-08-19 DIAGNOSIS — M6281 Muscle weakness (generalized): Secondary | ICD-10-CM | POA: Diagnosis not present

## 2023-08-19 DIAGNOSIS — M25461 Effusion, right knee: Secondary | ICD-10-CM | POA: Diagnosis not present

## 2023-08-19 DIAGNOSIS — M25512 Pain in left shoulder: Secondary | ICD-10-CM | POA: Diagnosis not present

## 2023-08-19 DIAGNOSIS — M25561 Pain in right knee: Secondary | ICD-10-CM | POA: Diagnosis not present

## 2023-08-19 DIAGNOSIS — R2689 Other abnormalities of gait and mobility: Secondary | ICD-10-CM | POA: Diagnosis not present

## 2023-08-21 DIAGNOSIS — M25561 Pain in right knee: Secondary | ICD-10-CM | POA: Diagnosis not present

## 2023-08-21 DIAGNOSIS — M6281 Muscle weakness (generalized): Secondary | ICD-10-CM | POA: Diagnosis not present

## 2023-08-21 DIAGNOSIS — Z9181 History of falling: Secondary | ICD-10-CM | POA: Diagnosis not present

## 2023-08-21 DIAGNOSIS — R2689 Other abnormalities of gait and mobility: Secondary | ICD-10-CM | POA: Diagnosis not present

## 2023-08-21 DIAGNOSIS — R2681 Unsteadiness on feet: Secondary | ICD-10-CM | POA: Diagnosis not present

## 2023-08-21 DIAGNOSIS — M25512 Pain in left shoulder: Secondary | ICD-10-CM | POA: Diagnosis not present

## 2023-08-21 DIAGNOSIS — M25461 Effusion, right knee: Secondary | ICD-10-CM | POA: Diagnosis not present

## 2023-08-23 ENCOUNTER — Other Ambulatory Visit: Payer: Self-pay | Admitting: Nurse Practitioner

## 2023-08-23 NOTE — Telephone Encounter (Signed)
Requested Prescriptions  Refused Prescriptions Disp Refills   valsartan (DIOVAN) 40 MG tablet [Pharmacy Med Name: VALSARTAN 40 MG TAB] 90 tablet 0    Sig: TAKE ONE TABLET (40 MG) BY MOUTH EVERY DAY     Cardiovascular:  Angiotensin Receptor Blockers Failed - 08/23/2023  8:54 AM      Failed - K in normal range and within 180 days    Potassium  Date Value Ref Range Status  05/24/2023 3.2 (L) 3.5 - 5.1 mmol/L Final         Passed - Cr in normal range and within 180 days    Creatinine, Ser  Date Value Ref Range Status  05/24/2023 0.80 0.44 - 1.00 mg/dL Final         Passed - Patient is not pregnant      Passed - Last BP in normal range    BP Readings from Last 1 Encounters:  06/11/23 106/65         Passed - Valid encounter within last 6 months    Recent Outpatient Visits           2 months ago Puncture wound of forehead, initial encounter   [place] [facility] [place], Sherran Needs, NP   3 months ago Atrial fibrillation, chronic [facility])   [place] [facility] Lar[facility] Grooms, NP   3 months ago Hypertension associated with diabetes [facility])   [place] [facility] Grooms, NP   3 months ago Hospital discharge follow-up   [place] [facility] Lar[facility] Grooms, NP   4 months ago Atrial fibrillation, chronic [facility])   [place] [facility] Larae Grooms, NP

## 2023-08-28 ENCOUNTER — Encounter: Payer: Self-pay | Admitting: Podiatry

## 2023-08-28 ENCOUNTER — Ambulatory Visit: Payer: Medicare PPO | Admitting: Podiatry

## 2023-08-28 DIAGNOSIS — R2681 Unsteadiness on feet: Secondary | ICD-10-CM | POA: Diagnosis not present

## 2023-08-28 DIAGNOSIS — S91114A Laceration without foreign body of right lesser toe(s) without damage to nail, initial encounter: Secondary | ICD-10-CM | POA: Diagnosis not present

## 2023-08-28 DIAGNOSIS — E1142 Type 2 diabetes mellitus with diabetic polyneuropathy: Secondary | ICD-10-CM | POA: Diagnosis not present

## 2023-08-28 DIAGNOSIS — M6281 Muscle weakness (generalized): Secondary | ICD-10-CM | POA: Diagnosis not present

## 2023-08-28 DIAGNOSIS — M25461 Effusion, right knee: Secondary | ICD-10-CM | POA: Diagnosis not present

## 2023-08-28 DIAGNOSIS — M2041 Other hammer toe(s) (acquired), right foot: Secondary | ICD-10-CM

## 2023-08-28 DIAGNOSIS — Z9181 History of falling: Secondary | ICD-10-CM | POA: Diagnosis not present

## 2023-08-28 DIAGNOSIS — M25512 Pain in left shoulder: Secondary | ICD-10-CM | POA: Diagnosis not present

## 2023-08-28 DIAGNOSIS — R2689 Other abnormalities of gait and mobility: Secondary | ICD-10-CM | POA: Diagnosis not present

## 2023-08-28 DIAGNOSIS — M25561 Pain in right knee: Secondary | ICD-10-CM | POA: Diagnosis not present

## 2023-08-28 MED ORDER — GABAPENTIN 100 MG PO CAPS: 200.0000 mg | ORAL_CAPSULE | Freq: Two times a day (BID) | ORAL | 3 refills | Status: DC

## 2023-08-28 MED ORDER — CEPHALEXIN 500 MG PO CAPS: 500.0000 mg | ORAL_CAPSULE | Freq: Three times a day (TID) | ORAL | 0 refills | Status: DC

## 2023-08-28 NOTE — Progress Notes (Signed)
She presents today after having not seen her for couple of years with a chief concern of a painful third toe on the right foot where she cut the skin while she was trying to cut her nails.  She states that her feet get really tired and she is worried that the neuropathy that she has may result in skin breakdown or sore.  Currently she is taking gabapentin 100 mg twice daily for her neuropathy.  She walks with a walking stick and states that she has fallen several times.  Objective: Vital signs are stable she is alert and oriented x 3 third toe right foot does demonstrate some mild erythema the wound appears to be healing very nicely but possible cellulitis to the level of the DIPJ.  She does have significant neuropathy extending from the toes to the ankle including the top of the foot and the bottom as well.  She has hammertoe deformities and early preulcerative lesions.  Assessment: Hammertoe deformities with diabetic peripheral neuropathy.  Cellulitis third digit right foot.  Elongated painful nails.  Preulcerative lesion.  Plan: Started her on Keflex 500 mg 1 p.o. 3 times daily.  Discussed soaking Epsom salts warm water.  I would like to increase her gabapentin to 200 mg twice a day.  This was reordered.  She will follow-up with Bethann Berkshire for diabetic shoes.  She will follow-up with Dr.Galaway for routine care.  I will follow-up with her in 1 month for med check.

## 2023-09-02 ENCOUNTER — Ambulatory Visit: Payer: Self-pay

## 2023-09-02 DIAGNOSIS — R2689 Other abnormalities of gait and mobility: Secondary | ICD-10-CM | POA: Diagnosis not present

## 2023-09-02 DIAGNOSIS — M25561 Pain in right knee: Secondary | ICD-10-CM | POA: Diagnosis not present

## 2023-09-02 DIAGNOSIS — M6281 Muscle weakness (generalized): Secondary | ICD-10-CM | POA: Diagnosis not present

## 2023-09-02 DIAGNOSIS — R2681 Unsteadiness on feet: Secondary | ICD-10-CM | POA: Diagnosis not present

## 2023-09-02 DIAGNOSIS — Z9181 History of falling: Secondary | ICD-10-CM | POA: Diagnosis not present

## 2023-09-02 DIAGNOSIS — M25512 Pain in left shoulder: Secondary | ICD-10-CM | POA: Diagnosis not present

## 2023-09-02 DIAGNOSIS — M25461 Effusion, right knee: Secondary | ICD-10-CM | POA: Diagnosis not present

## 2023-09-02 NOTE — Telephone Encounter (Signed)
      Chief Complaint: Larey Seat at home yesterday, hit left temple, has a bruise. Bending over walker and fell over. Symptoms: Above Frequency: Yesterday Pertinent Negatives: Patient denies any pain or headache Disposition: [] ED /[] Urgent Care (no appt availability in office) / [x] Appointment(In office/virtual)/ []  [place] [facility]/ [] Home Care/ [] Refused Recommended Disposition /[] [place] [facility]/ []  Follow-up with PCP Additional Notes: Appointment made per [place] in the practice.  Reason for Disposition  [1] No prior tetanus shots (or is not fully vaccinated) AND [2] any wound (e.g., cut or scrape)  Answer Assessment - Initial Assessment Questions 1. SYMPTOM: "What's the main symptom you're concerned about?" (e.g., frequency, incontinence)     Frequency, burning, urgency 2. ONSET: "When did the    start?"     Yesterday 3. PAIN: "Is there any pain?" If Yes, ask: "How bad is it?" (Scale: 1-10; mild, moderate, severe)     No 4. CAUSE: "What do you think is causing the symptoms?"     UTI 5. OTHER SYMPTOMS: "Do you have any other symptoms?" (e.g., blood in urine, fever, flank pain, pain with urination)     No 6. PREGNANCY: "Is there any chance you are pregnant?" "When was your last menstrual period?"     No  Answer Assessment - Initial Assessment Questions 1. MECHANISM: "How did the fall happen?"     Tipped over her walker when she bent over 2. DOMESTIC VIOLENCE AND ELDER ABUSE SCREENING: "Did you fall because someone pushed you or tried to hurt you?" If Yes, ask: "Are you safe now?"     No 3. ONSET: "When did the fall happen?" (e.g., minutes, hours, or days ago)     Yesterday 4. LOCATION: "What part of the body hit the ground?" (e.g., back, buttocks, head, hips, knees, hands, head, stomach)     Hit head 5. INJURY: "Did you hurt (injure) yourself when you fell?" If Yes, ask: "What did you injure? Tell me more about this?" (e.g., body area; type of injury; pain severity)"      Bruise to head 6. PAIN: "Is there any pain?" If Yes, ask: "How bad is the pain?" (e.g., Scale 1-10; or mild,  moderate, severe)   - NONE (0): No pain   - MILD (1-3): Doesn't interfere with normal activities    - MODERATE (4-7): Interferes with normal activities or awakens from sleep    - SEVERE (8-10): Excruciating pain, unable to do any normal activities      None 7. SIZE: For cuts, bruises, or swelling, ask: "How large is it?" (e.g., inches or centimeters)      No 8. PREGNANCY: "Is there any chance you are pregnant?" "When was your last menstrual period?"     No 9. OTHER SYMPTOMS: "Do you have any other symptoms?" (e.g., dizziness, fever, weakness; new onset or worsening).      No 10. CAUSE: "What do you think caused the fall (or falling)?" (e.g., tripped, dizzy spell)       nO  Protocols used: Urinary Symptoms-A-AH, Falls and Falling-A-AH

## 2023-09-03 ENCOUNTER — Ambulatory Visit
Admission: RE | Admit: 2023-09-03 | Discharge: 2023-09-03 | Disposition: A | Discharge status: Home / Self Care | Payer: Medicare PPO | Source: Ambulatory Visit | Attending: Pediatrics | Admitting: Pediatrics

## 2023-09-03 ENCOUNTER — Ambulatory Visit: Payer: Medicare PPO | Admitting: Pediatrics

## 2023-09-03 ENCOUNTER — Encounter: Payer: Self-pay | Admitting: Pediatrics

## 2023-09-03 VITALS — BP 114/79 | HR 91 | Temp 97.9°F

## 2023-09-03 DIAGNOSIS — S0990XA Unspecified injury of head, initial encounter: Secondary | ICD-10-CM

## 2023-09-03 DIAGNOSIS — R5383 Other fatigue: Secondary | ICD-10-CM

## 2023-09-03 DIAGNOSIS — E1142 Type 2 diabetes mellitus with diabetic polyneuropathy: Secondary | ICD-10-CM | POA: Diagnosis not present

## 2023-09-03 DIAGNOSIS — R300 Dysuria: Secondary | ICD-10-CM | POA: Diagnosis not present

## 2023-09-03 DIAGNOSIS — S0003XA Contusion of scalp, initial encounter: Secondary | ICD-10-CM | POA: Diagnosis not present

## 2023-09-03 DIAGNOSIS — Z7984 Long term (current) use of oral hypoglycemic drugs: Secondary | ICD-10-CM

## 2023-09-03 DIAGNOSIS — R930 Abnormal findings on diagnostic imaging of skull and head, not elsewhere classified: Secondary | ICD-10-CM | POA: Diagnosis not present

## 2023-09-03 LAB — WET PREP FOR TRICH, YEAST, CLUE
Clue Cell Exam: NEGATIVE
Trichomonas Exam: NEGATIVE
Yeast Exam: NEGATIVE

## 2023-09-03 LAB — URINALYSIS, ROUTINE W REFLEX MICROSCOPIC
Bilirubin, UA: NEGATIVE
Ketones, UA: NEGATIVE
Leukocytes,UA: NEGATIVE
Nitrite, UA: NEGATIVE
Protein,UA: NEGATIVE
RBC, UA: NEGATIVE
Specific Gravity, UA: 1.015 (ref 1.005–1.030)
Urobilinogen, Ur: 0.2 mg/dL (ref 0.2–1.0)
pH, UA: 7 (ref 5.0–7.5)

## 2023-09-03 LAB — BAYER DCA HB A1C WAIVED: HB A1C (BAYER DCA - WAIVED): 7.2 % — ABNORMAL HIGH (ref 4.8–5.6)

## 2023-09-03 NOTE — Progress Notes (Signed)
   BP 114/79   Pulse 91   Temp 97.9 F (36.6 C) (Oral)    Subjective:    Patient ID: Alison Richardson, female    DOB: 02-02-1933, 89 y.o.   MRN: [id number]  HPI: Alison Richardson is a 89 y.o. female  Chief Complaint  Patient presents with   Fall   Urinary Tract Infection   Fall Fatigue for 5 days,  She has walking around, felt shakey, bent down and hit her head Larey Seat yesterday 9/9, w head trauma Did not go to ED, has been feeling more dizzy (new) No preceding symptoms She is worried about UTI, had some urgency; no recurrence of urinary symptoms She has DM, well controlled She lives in assisted living- [facility] She had urinary urgency 3 days ago, new symptoms, had x5 occurences No heart palpitations, chest pain, SOB She is on blood thinners Has felt fatigue, has felt spacey Before 5 days ago  Has felt a little dizziness, not forgetful No headaches, nausea, vomiting   Relevant past medical, surgical, family and social history reviewed and updated as indicated. Interim medical history since our last visit reviewed. Allergies and medications reviewed and updated.  ROS per HPI unless specifically indicated above     Objective:    BP 114/79   Pulse 91   Temp 97.9 F (36.6 C) (Oral)   Wt Readings from Last 3 Encounters:  06/11/23 163 lb 9.6 oz (74.2 kg)  05/24/23 175 lb (79.4 kg)  05/21/23 172 lb (78 kg)     Physical Exam: GEN: alert, cooperative, and in NAD HENT: atraumatic, normocephalic EYES: anicteric sclera  CV: hemodynamically stable, RRR, no murmurs, rubs, gallops  RESP: breathing comfortably on room air, no wheezing, crackles, rales GI/ABD: soft, non-tender, non-distended  EXT: warm and well perfused, no lower extremity edema PSYCH: normal behavior and appropriate affect NEURO: CN all intact, no sensory or motor deficits noted, no coordination deficits.  Assessment & Plan:  Assessment & Plan   Alison Richardson was seen today for fall and urinary tract  infection.  Diagnoses and all orders for this visit:  Dysuria Reassuring u/a, culture pending. -     Urinalysis, Routine w reflex microscopic -     Urine Culture  Other fatigue New change, appears to be unrelated to above urinary symptoms. Plan to check for anemia, b12, folate deficiencies. Will check thyroid panel. She additionally has had some vaginal itching, will check for yeast as well. -     Thyroid Panel With TSH -     CBC With Differential/Platelet -     WET PREP FOR TRICH, YEAST, CLUE -     B12 -     Folate  Type 2 diabetes mellitus with peripheral neuropathy (HCC) Due for recheck. -     Bayer DCA Hb A1c Waived  Traumatic injury of head, initial encounter Recent fall with head trauma, has bruising on temple. She is neurologically intact but is on chronic anticoagulation. Plan to r/o head bleed. Does not want to go to ED if able to complete imaging outpatient. Pt given strict return precautions and anticipatory guidance for ED. -     CT HEAD WO CONTRAST ( ); Future  Follow up plan: No follow-ups on file.  Clayten Allcock Howell Pringle, MD

## 2023-09-03 NOTE — Patient Instructions (Addendum)
Plan:   - will check labs to look into your fatigue - head CT to make sure there is no bleed - if the CT shows a bleed, I will call you to go to the emergency room

## 2023-09-04 DIAGNOSIS — Z9181 History of falling: Secondary | ICD-10-CM | POA: Diagnosis not present

## 2023-09-04 DIAGNOSIS — M6281 Muscle weakness (generalized): Secondary | ICD-10-CM | POA: Diagnosis not present

## 2023-09-04 DIAGNOSIS — M25461 Effusion, right knee: Secondary | ICD-10-CM | POA: Diagnosis not present

## 2023-09-04 DIAGNOSIS — R2689 Other abnormalities of gait and mobility: Secondary | ICD-10-CM | POA: Diagnosis not present

## 2023-09-04 DIAGNOSIS — M25512 Pain in left shoulder: Secondary | ICD-10-CM | POA: Diagnosis not present

## 2023-09-04 DIAGNOSIS — R2681 Unsteadiness on feet: Secondary | ICD-10-CM | POA: Diagnosis not present

## 2023-09-04 DIAGNOSIS — M25561 Pain in right knee: Secondary | ICD-10-CM | POA: Diagnosis not present

## 2023-09-05 ENCOUNTER — Other Ambulatory Visit: Payer: Self-pay | Admitting: Nurse Practitioner

## 2023-09-05 LAB — URINE CULTURE: Organism ID, Bacteria: NO GROWTH

## 2023-09-06 MED ORDER — METOPROLOL SUCCINATE ER 100 MG PO TB24: 100.0000 mg | ORAL_TABLET | Freq: Every day | ORAL | 3 refills | Status: DC

## 2023-09-09 ENCOUNTER — Other Ambulatory Visit: Payer: Self-pay | Admitting: Nurse Practitioner

## 2023-09-09 DIAGNOSIS — M25512 Pain in left shoulder: Secondary | ICD-10-CM | POA: Diagnosis not present

## 2023-09-09 DIAGNOSIS — M25561 Pain in right knee: Secondary | ICD-10-CM | POA: Diagnosis not present

## 2023-09-09 DIAGNOSIS — Z9181 History of falling: Secondary | ICD-10-CM | POA: Diagnosis not present

## 2023-09-09 DIAGNOSIS — R2681 Unsteadiness on feet: Secondary | ICD-10-CM | POA: Diagnosis not present

## 2023-09-09 DIAGNOSIS — R2689 Other abnormalities of gait and mobility: Secondary | ICD-10-CM | POA: Diagnosis not present

## 2023-09-09 DIAGNOSIS — M25461 Effusion, right knee: Secondary | ICD-10-CM | POA: Diagnosis not present

## 2023-09-09 DIAGNOSIS — M6281 Muscle weakness (generalized): Secondary | ICD-10-CM | POA: Diagnosis not present

## 2023-09-10 ENCOUNTER — Ambulatory Visit: Payer: Medicare PPO | Admitting: Pediatrics

## 2023-09-10 ENCOUNTER — Encounter: Payer: Self-pay | Admitting: Pediatrics

## 2023-09-10 ENCOUNTER — Other Ambulatory Visit: Payer: Self-pay | Admitting: Nurse Practitioner

## 2023-09-10 VITALS — BP 101/63 | HR 92 | Ht 66.0 in | Wt 163.8 lb

## 2023-09-10 DIAGNOSIS — R42 Dizziness and giddiness: Secondary | ICD-10-CM | POA: Diagnosis not present

## 2023-09-10 DIAGNOSIS — Z79899 Other long term (current) drug therapy: Secondary | ICD-10-CM | POA: Diagnosis not present

## 2023-09-10 DIAGNOSIS — G629 Polyneuropathy, unspecified: Secondary | ICD-10-CM

## 2023-09-10 LAB — THYROID PANEL WITH TSH
Free Thyroxine Index: 4.9 (ref 1.2–4.9)
T3 Uptake Ratio: 32 % (ref 24–39)
T4, Total: 15.2 ug/dL — ABNORMAL HIGH (ref 4.5–12.0)
TSH: 3.41 u[IU]/mL (ref 0.450–4.500)

## 2023-09-10 LAB — CBC WITH DIFFERENTIAL/PLATELET

## 2023-09-10 LAB — VITAMIN B12: Vitamin B-12: 445 pg/mL (ref 232–1245)

## 2023-09-10 LAB — FOLATE: Folate: 19.8 ng/mL (ref >3.0)

## 2023-09-10 MED ORDER — GABAPENTIN 100 MG PO CAPS
100.0000 mg | ORAL_CAPSULE | Freq: Every day | ORAL | Status: DC
Start: 2023-09-10 — End: 2024-10-29

## 2023-09-10 NOTE — Telephone Encounter (Signed)
Rx  07/29/23 #90- too soon Requested Prescriptions  Pending Prescriptions Disp Refills   glipiZIDE (GLUCOTROL XL) 5 MG 24 hr tablet [Pharmacy Med Name: GLIPIZIDE ER 5 MG TAB] 90 tablet 0    Sig: TAKE ONE TABLET (5 MG) BY MOUTH EVERY DAY     Endocrinology:  Diabetes - Sulfonylureas Passed - 09/09/2023 11:51 AM      Passed - HBA1C is between 0 and 7.9 and within 180 days    HB A1C (BAYER DCA - WAIVED)  Date Value Ref Range Status  09/03/2023 7.2 (H) 4.8 - 5.6 % Final    Comment:             Prediabetes: 5.7 - 6.4          Diabetes: >6.4          Glycemic control for adults with diabetes: <7.0          Passed - Cr in normal range and within 360 days    Creatinine, Ser  Date Value Ref Range Status  05/24/2023 0.80 0.44 - 1.00 mg/dL Final         Passed - Valid encounter within last 6 months    Recent Outpatient Visits           1 week ago Dysuria   [place] [facility] Jackolyn Confer, MD   3 months ago Puncture wound of forehead, initial encounter   [place] [facility] Pearley, Sherran Needs, NP   3 months ago Atrial fibrillation, chronic [facility])   [place] [facility] Larae Grooms, NP   3 months ago Hypertension associated with diabetes [facility])   [place] [facility] Larae Grooms, NP   4 months ago Hospital discharge follow-up   [place] [facility] Larae Grooms, NP       Future Appointments             Today Evelene Croon, Atilano Median, MD  [facility], [facility]

## 2023-09-10 NOTE — Patient Instructions (Signed)
We are going to lower the dose of gabapentin to 100mg , one pill only at night for 7 days  If your symptoms get better, you can stay at 100mg . If they don't get better, stop the gabapentin all together. We will check in in 2 weeks and see if that made a difference. If the symptoms go away with lower to 100mg  or 1 pill, you can stay at the dose until our appointment.  Please call your cardiologist to schedule follow up and discuss amiodarone medication and the symptoms you have been having.

## 2023-09-10 NOTE — Progress Notes (Signed)
BP 101/63   Pulse 92   Ht 5\' 6"  (1.676 m)   Wt 163 lb 12.8 oz (74.3 kg)   SpO2 94%   BMI 26.44 kg/m    Subjective:    Patient ID: Alison Richardson, female    DOB: 02-12-1933, 88 y.o.   MRN: [id number]  HPI: Alison Richardson is a 89 y.o. female  Chief Complaint  Patient presents with   Fatigue   Diabetes   Dizziness    Patient says she brought her medications with her at today's visit, as she has been experiencing a lot of dizziness and says she thinks she may be taking too much medications. Patient says she is feeling really insecure.    #Dizziness Reported 2 weeks ago and has not improved She wakes up every day lightheaded, room is not spinning She is concerned about taking too many medications and this being the cause Most recent med change was increased gabapentin to 200mg  for her neuropathy, she takes this at night Cardiology recommended f/u to consider discontinuing amiodarone. She has not passed out She feels it most pronounced in the morning Blood pressures have been in normal ranges at home Has not felt more palpitations or other symptoms.  Normal blood work and head imaging last visit  Relevant past medical, surgical, family and social history reviewed and updated as indicated. Interim medical history since our last visit reviewed. Allergies and medications reviewed and updated.  ROS per HPI unless specifically indicated above     Objective:    BP 101/63   Pulse 92   Ht 5\' 6"  (1.676 m)   Wt 163 lb 12.8 oz (74.3 kg)   SpO2 94%   BMI 26.44 kg/m   Wt Readings from Last 3 Encounters:  09/10/23 163 lb 12.8 oz (74.3 kg)  06/11/23 163 lb 9.6 oz (74.2 kg)  05/24/23 175 lb (79.4 kg)     Physical Exam Constitutional:      Appearance: Normal appearance.  HENT:     Mouth/Throat:     Mouth: Mucous membranes are moist.     Pharynx: Oropharynx is clear.  Eyes:     Pupils: Pupils are equal, round, and reactive to light.  Cardiovascular:     Rate and Rhythm:  Normal rate and regular rhythm.  Pulmonary:     Effort: Pulmonary effort is normal.  Abdominal:     General: Abdomen is flat.     Palpations: Abdomen is soft.  Musculoskeletal:        General: Normal range of motion.  Skin:    General: Skin is warm and dry.     Comments: Normal skin color  Neurological:     General: No focal deficit present.     Mental Status: She is alert. Mental status is at baseline.  Psychiatric:        Mood and Affect: Mood normal.        Behavior: Behavior normal.        Thought Content: Thought content normal.        Assessment & Plan:  Assessment & Plan   Dizziness Neuropathy Polypharmacy Recent blood work reassuring. She continues to have lightheadedness in the mornings. Suspect some degree of polypharmacy. Most recent med change gabapentin, suspect may be contributing. Plan to decrease to 100mg . Resolved can stay at that dose for neuropathy. If persistent will have her fully come off medication. Additionally, she is overdue to see cardiology to discuss amiodarone therapy. Will work with pharmacy to consolidate  medications as best we can. Pt given strict return precautions and anticipatory guidance for ED. Reassess in 2 weeks.  Follow up plan: Return in about 2 weeks (around 09/24/2023) for dizziness.  Ileigh Mettler Howell Pringle, MD

## 2023-09-11 DIAGNOSIS — M25512 Pain in left shoulder: Secondary | ICD-10-CM | POA: Diagnosis not present

## 2023-09-11 DIAGNOSIS — R2681 Unsteadiness on feet: Secondary | ICD-10-CM | POA: Diagnosis not present

## 2023-09-11 DIAGNOSIS — M25561 Pain in right knee: Secondary | ICD-10-CM | POA: Diagnosis not present

## 2023-09-11 DIAGNOSIS — M25461 Effusion, right knee: Secondary | ICD-10-CM | POA: Diagnosis not present

## 2023-09-11 DIAGNOSIS — Z9181 History of falling: Secondary | ICD-10-CM | POA: Diagnosis not present

## 2023-09-11 DIAGNOSIS — R2689 Other abnormalities of gait and mobility: Secondary | ICD-10-CM | POA: Diagnosis not present

## 2023-09-11 DIAGNOSIS — M6281 Muscle weakness (generalized): Secondary | ICD-10-CM | POA: Diagnosis not present

## 2023-09-12 ENCOUNTER — Other Ambulatory Visit: Payer: Medicare PPO

## 2023-09-12 ENCOUNTER — Other Ambulatory Visit: Payer: Self-pay | Admitting: Nurse Practitioner

## 2023-09-12 NOTE — Progress Notes (Signed)
09/12/2023 Name: Alison Richardson MRN: [id number] DOB: [birth year]  Chief Complaint  Patient presents with   Medication Management   Alison Richardson is a 89 y.o. year old female who presented for a telephone visit.   They were referred to the pharmacist by their PCP for assistance in managing complex medication management.   Subjective:  Care Team: Primary Care Provider: Jackolyn Confer, MD ; Next Scheduled Visit: 10/1  Medication Access/Adherence  Current Pharmacy:  [facility] - [place], [place] - [address] [place] [address] Phone: [phone] Fax: [phone]  -Patient reports affordability concerns with their medications: No  -Patient reports access/transportation concerns to their pharmacy: No  -Patient reports adherence concerns with their medications:  Yes  Does not have Farxiga 5mg  on hand and is unsure why this has not been refilled  Diabetes: Current medications: Farxiga 10mg  daily, glipizide xl 5mg  daily,  -Medications tried in the past: metformin caused diarrhea -Current glucose readings: FBG averages 100 per patient -Using Accu Chek Guide meter; testing BG once daily -Patient denies hypoglycemic s/sx including dizziness, shakiness, sweating.  -Patient denies hyperglycemic symptoms including polyuria, polydipsia, polyphagia, nocturia, neuropathy, blurred vision. -Patient states she is out of [place] 10mg  daily, and it appears this has not been filled since a 1 month supply the beginning of August.  Patient states this could have been due to a high copay, but she is unsure and would like it refilled.  Medication Management: Current adherence strategy: previously receiving medications in pill packaging from [facility], but patient states this was stopped after medication changes upon hospital discharge in June -Patient would like to receive medications in adherence packaging again -Patient reports Good adherence to  medications -Patient reports the following barriers to adherence: out of Farxiga at this time and down to last few tablets of donepezil -Patient states gabapentin recently decreased to 100mg  hs due to dizziness.  She states dizziness has subsided, but she is now experiencing increased neuropathy.  Objective: Lab Results  Component Value Date   HGBA1C 7.2 (H) 09/03/2023   Lab Results  Component Value Date   CREATININE 0.80 05/24/2023   BUN 13 05/24/2023   NA 140 05/24/2023   K 3.2 (L) 05/24/2023   CL 104 05/24/2023   CO2 22 05/24/2023   Medications Reviewed Today     Reviewed by Lenna Gilford, RPH (Pharmacist) on 09/12/23 at 1056  Med List Status: <None>   Medication Order Taking? Sig Documenting Provider Last Dose Status Informant  albuterol (VENTOLIN HFA) 108 (90 Base) MCG/ACT inhaler [id number] Yes Inhale 2 puffs into the lungs every 6 (six) hours as needed for wheezing or shortness of breath. [provider] Taking Active Nursing Home Medication Administration Guide (MAG)           Med Note Littie Deeds, Dorrine Montone A   Thu Sep 12, 2023 10:34 AM) Has on hand if needed  amiodarone (PACERONE) 200 MG tablet [id number] Yes Take 100 mg by mouth daily. [provider] Taking Active Nursing Home Medication Administration Guide (MAG)  apixaban (ELIQUIS) 5 MG TABS tablet [id number] Yes Take 5 mg by mouth 2 (two) times daily. [provider] Taking Active Nursing Home Medication Administration Guide (MAG)  clopidogrel (PLAVIX) 75 MG tablet [id number] Yes Take 75 mg by mouth daily. [provider] Taking Active Nursing Home Medication Administration Guide (MAG)  dapagliflozin propanediol (FARXIGA) 10 MG TABS tablet [id number] No TAKE 1 TABLET BY MOUTH DAILY BEFORE BREAKFAST  Patient not taking: Reported on 09/10/2023   Delma Freeze, FNP Not Taking Active Nursing Home Medication Administration Guide (MAG)           Med Note Littie Deeds, Eldar Robitaille A   Thu Sep 12, 2023 10:49  AM) cost  diltiazem (CARDIZEM CD) 240 MG 24 hr capsule [id number] Yes Take 240 mg by mouth daily. [provider] Taking Active Nursing Home Medication Administration Guide (MAG)  donepezil (ARICEPT) 10 MG tablet [id number] Yes TAKE ONE TABLET (10 MG) BY MOUTH AT BEDTIME  Patient taking differently: Take 10 mg by mouth at bedtime.   Larae Grooms, NP Taking Active Nursing Home Medication Administration Guide (MAG)  gabapentin (NEURONTIN) 100 MG capsule [id number] Yes Take 1 capsule (100 mg total) by mouth at bedtime. Jackolyn Confer, MD Taking Active   glipiZIDE (GLUCOTROL XL) 5 MG 24 hr tablet [id number] Yes TAKE 1 TABLET BY MOUTH DAILY Larae Grooms, NP Taking Active   glucose blood (ACCU-CHEK GUIDE) test strip [id number] Yes USE TO TEST ONCE DAILY AS DIRECTED Jackolyn Confer, MD Taking Active   levothyroxine (SYNTHROID) 88 MCG tablet [id number] Yes TAKE 1 TABLET BY MOUTH DAILY. Larae Grooms, NP Taking Active   metoprolol succinate (TOPROL-XL) 100 MG 24 hr tablet [id number] Yes Take 1 tablet (100 mg total) by mouth daily. TAKE ONE TABLET (100 MG) BY MOUTH EVERY DAY (TAKE WITH OR IMMEDIATELY AFTER A MEAL) Strength: 100 mg Jackolyn Confer, MD Taking Active   nitroGLYCERIN (NITROSTAT) 0.4 MG SL tablet [id number] Yes Place 0.4 mg under the tongue every 5 (five) minutes as needed for chest pain. [provider] Taking Active Nursing Home Medication Administration Guide (MAG)           Med Note Littie Deeds, Kaetlyn Noa A   Thu Sep 12, 2023 10:47 AM) Has on hand if needed  pravastatin (PRAVACHOL) 20 MG tablet [id number] Yes Take 20 mg by mouth at bedtime. [provider] Taking Active Nursing Home Medication Administration Guide (MAG)  torsemide (DEMADEX) 20 MG tablet [id number] Yes Take 20 mg by mouth daily. [provider] Taking Active   valsartan (DIOVAN) 40 MG tablet [id number] Yes TAKE ONE TABLET BY MOUTH EVERY DAY Larae Grooms, NP Taking Active   venlafaxine XR  (EFFEXOR-XR) 75 MG 24 hr capsule [id number] Yes TAKE 1 CAPSULE BY MOUTH ONCE DAILY. Larae Grooms, NP Taking Active   Med List Note Sharia Reeve, CPhT 05/25/23 1129): Resident at [facility] at [place] [facility] [phone]           Assessment/Plan:   Diabetes: - Currently controlled - Recommend to continue currently prescribed regimen - Contacted [facility], and they are able to refill Farxiga 10mg  daily for $64, which is what patient paid last time  Medication Management: - Currently strategy insufficient to maintain appropriate adherence to prescribed medication regimen - Contacted [facility], and they are refilling donepezil for patient - Inquired about getting patient set back up for adherence packaging and automatic refills (medications are already delivered from pharmacy to her independent living facility).  Pharmacy does endorse lack of adherence since patient stopped getting medications packaged.  They also state patient has gone back/forth several times with packaging, and the pharmacy cannot continue to start/stop process.  Pharmacy states they will work with patient and her daughter Misty Stanley to get her set back up for adherence packaging and automatic refills.  Follow Up Plan: None scheduled at this time but can as recommended per PCP, and I have  provided patient with my direct phone number  Lenna Gilford, PharmD, DPLA

## 2023-09-12 NOTE — Telephone Encounter (Signed)
Requested medication (s) are due for refill today: ?  Requested medication (s) are on the active medication list: No  Last refill:  ?  Future visit scheduled: Yes  Notes to clinic:  Not on medication list.    Requested Prescriptions  Pending Prescriptions Disp Refills   ACCU-CHEK GUIDE test strip [Pharmacy Med Name: ACCU-CHEK GUIDE STRIP] 50 each     Sig: USE TO TEST ONCE DAILY AS DIRECTED     Endocrinology: Diabetes - Testing Supplies Passed - 09/10/2023  2:47 PM      Passed - Valid encounter within last 12 months    Recent Outpatient Visits           2 days ago Dizziness   [place] [facility] Jackolyn Confer, MD   1 week ago Dysuria   [place] [facility] Jackolyn Confer, MD   3 months ago Puncture wound of forehead, initial encounter   [place] [facility] [place], Sherran Needs, NP   3 months ago Atrial fibrillation, chronic [facility])   [place] [facility] Larae Grooms, NP   3 months ago Hypertension associated with diabetes [facility])   [place] [facility] Larae Grooms, NP       Future Appointments             In 1 week Evelene Croon, Atilano Median, MD [facility], [facility]

## 2023-09-13 ENCOUNTER — Ambulatory Visit: Payer: Medicare PPO

## 2023-09-13 DIAGNOSIS — M2041 Other hammer toe(s) (acquired), right foot: Secondary | ICD-10-CM

## 2023-09-13 DIAGNOSIS — S91114A Laceration without foreign body of right lesser toe(s) without damage to nail, initial encounter: Secondary | ICD-10-CM

## 2023-09-13 DIAGNOSIS — L603 Nail dystrophy: Secondary | ICD-10-CM

## 2023-09-13 DIAGNOSIS — E1142 Type 2 diabetes mellitus with diabetic polyneuropathy: Secondary | ICD-10-CM

## 2023-09-13 DIAGNOSIS — M7751 Other enthesopathy of right foot: Secondary | ICD-10-CM

## 2023-09-14 NOTE — Progress Notes (Signed)
Patient presents to the office today for diabetic shoe and insole measuring.  Patient was measured with brannock device to determine size and width for 1 pair of extra depth shoes and foam casted for 3 pair of insoles.   Documentation of medical necessity will be sent to patient's treating diabetic doctor to verify and sign.   Patient's diabetic provider: Modena Nunnery MD   Shoes and insoles will be ordered at that time and patient will be notified for an appointment for fitting when they arrive.   Shoe size (per patient): 8 Brannock measurement: 8 Patient shoe selection- Shoe choice:   [id number] / [id number] Shoe size ordered: 8WD  ABN and financials signed  Addison Bailey Cped, CFo, CFm

## 2023-09-16 NOTE — Telephone Encounter (Signed)
Requested Prescriptions  Pending Prescriptions Disp Refills   donepezil (ARICEPT) 10 MG tablet [Pharmacy Med Name: DONEPEZIL HCL 10 MG TAB] 90 tablet 0    Sig: TAKE ONE TABLET (10 MG) BY MOUTH AT BEDTIME     Neurology:  Alzheimer's Agents Passed - 09/12/2023  2:20 PM      Passed - Valid encounter within last 6 months    Recent Outpatient Visits           6 days ago Dizziness   [place] [facility] Jackolyn Confer, MD   1 week ago Dysuria   [place] [facility] Jackolyn Confer, MD   3 months ago Puncture wound of forehead, initial encounter   [place] [facility], Sherran Needs, NP   3 months ago Atrial fibrillation, chronic [facility])   [place] [facility] Larae Grooms, NP   4 months ago Hypertension associated with diabetes [facility])   [place] [facility] Larae Grooms, NP       Future Appointments             In 1 week Evelene Croon, Atilano Median, MD [facility], [facility]

## 2023-09-17 ENCOUNTER — Ambulatory Visit: Payer: Medicare PPO | Admitting: Pediatrics

## 2023-09-18 DIAGNOSIS — R2681 Unsteadiness on feet: Secondary | ICD-10-CM | POA: Diagnosis not present

## 2023-09-18 DIAGNOSIS — M25561 Pain in right knee: Secondary | ICD-10-CM | POA: Diagnosis not present

## 2023-09-18 DIAGNOSIS — Z9181 History of falling: Secondary | ICD-10-CM | POA: Diagnosis not present

## 2023-09-18 DIAGNOSIS — M25461 Effusion, right knee: Secondary | ICD-10-CM | POA: Diagnosis not present

## 2023-09-18 DIAGNOSIS — R2689 Other abnormalities of gait and mobility: Secondary | ICD-10-CM | POA: Diagnosis not present

## 2023-09-18 DIAGNOSIS — M6281 Muscle weakness (generalized): Secondary | ICD-10-CM | POA: Diagnosis not present

## 2023-09-18 DIAGNOSIS — M25512 Pain in left shoulder: Secondary | ICD-10-CM | POA: Diagnosis not present

## 2023-09-19 DIAGNOSIS — M25461 Effusion, right knee: Secondary | ICD-10-CM | POA: Diagnosis not present

## 2023-09-19 DIAGNOSIS — Z9181 History of falling: Secondary | ICD-10-CM | POA: Diagnosis not present

## 2023-09-19 DIAGNOSIS — M6281 Muscle weakness (generalized): Secondary | ICD-10-CM | POA: Diagnosis not present

## 2023-09-19 DIAGNOSIS — M25561 Pain in right knee: Secondary | ICD-10-CM | POA: Diagnosis not present

## 2023-09-19 DIAGNOSIS — R2681 Unsteadiness on feet: Secondary | ICD-10-CM | POA: Diagnosis not present

## 2023-09-20 DIAGNOSIS — D2262 Melanocytic nevi of left upper limb, including shoulder: Secondary | ICD-10-CM | POA: Diagnosis not present

## 2023-09-20 DIAGNOSIS — D2271 Melanocytic nevi of right lower limb, including hip: Secondary | ICD-10-CM | POA: Diagnosis not present

## 2023-09-20 DIAGNOSIS — D2261 Melanocytic nevi of right upper limb, including shoulder: Secondary | ICD-10-CM | POA: Diagnosis not present

## 2023-09-20 DIAGNOSIS — D225 Melanocytic nevi of trunk: Secondary | ICD-10-CM | POA: Diagnosis not present

## 2023-09-20 DIAGNOSIS — Z86006 Personal history of melanoma in-situ: Secondary | ICD-10-CM | POA: Diagnosis not present

## 2023-09-20 DIAGNOSIS — D2272 Melanocytic nevi of left lower limb, including hip: Secondary | ICD-10-CM | POA: Diagnosis not present

## 2023-09-20 DIAGNOSIS — Z85828 Personal history of other malignant neoplasm of skin: Secondary | ICD-10-CM | POA: Diagnosis not present

## 2023-09-20 DIAGNOSIS — D044 Carcinoma in situ of skin of scalp and neck: Secondary | ICD-10-CM | POA: Diagnosis not present

## 2023-09-20 DIAGNOSIS — D485 Neoplasm of uncertain behavior of skin: Secondary | ICD-10-CM | POA: Diagnosis not present

## 2023-09-23 DIAGNOSIS — Z9181 History of falling: Secondary | ICD-10-CM | POA: Diagnosis not present

## 2023-09-23 DIAGNOSIS — M25461 Effusion, right knee: Secondary | ICD-10-CM | POA: Diagnosis not present

## 2023-09-23 DIAGNOSIS — M6281 Muscle weakness (generalized): Secondary | ICD-10-CM | POA: Diagnosis not present

## 2023-09-23 DIAGNOSIS — R2689 Other abnormalities of gait and mobility: Secondary | ICD-10-CM | POA: Diagnosis not present

## 2023-09-23 DIAGNOSIS — M25561 Pain in right knee: Secondary | ICD-10-CM | POA: Diagnosis not present

## 2023-09-23 DIAGNOSIS — R2681 Unsteadiness on feet: Secondary | ICD-10-CM | POA: Diagnosis not present

## 2023-09-23 DIAGNOSIS — M25512 Pain in left shoulder: Secondary | ICD-10-CM | POA: Diagnosis not present

## 2023-09-24 ENCOUNTER — Other Ambulatory Visit: Payer: Self-pay | Admitting: Nurse Practitioner

## 2023-09-24 ENCOUNTER — Ambulatory Visit: Payer: Medicare PPO | Admitting: Pediatrics

## 2023-09-24 NOTE — Telephone Encounter (Signed)
Request too soon last ordered 09/06/23 #90, 3RF Requested Prescriptions  Pending Prescriptions Disp Refills   metoprolol succinate (TOPROL-XL) 100 MG 24 hr tablet [Pharmacy Med Name: METOPROLOL SUCCINATE ER 100 MG TAB] 90 tablet 3    Sig: TAKE ONE TABLET (100 MG) BY MOUTH EVERY DAY (TAKE WITH OR IMMEDIATELY AFTER A MEAL)     Cardiovascular:  Beta Blockers Passed - 09/24/2023 10:34 AM      Passed - Last BP in normal range    BP Readings from Last 1 Encounters:  09/10/23 101/63         Passed - Last Heart Rate in normal range    Pulse Readings from Last 1 Encounters:  09/10/23 92         Passed - Valid encounter within last 6 months    Recent Outpatient Visits           2 weeks ago Dizziness   [place] [facility] Jackolyn Confer, MD   3 weeks ago Dysuria   [place] [facility] Jackolyn Confer, MD   3 months ago Puncture wound of forehead, initial encounter   [place] [facility] [place], Sherran Needs, NP   4 months ago Atrial fibrillation, chronic [facility])   [place] [facility] Larae Grooms, NP   4 months ago Hypertension associated with diabetes [facility])    [facility] Larae Grooms, NP       Future Appointments             In 2 days Evelene Croon, Atilano Median, MD [facility], [facility]

## 2023-09-26 ENCOUNTER — Encounter: Payer: Self-pay | Admitting: Pediatrics

## 2023-09-26 ENCOUNTER — Ambulatory Visit: Payer: Medicare PPO | Admitting: Pediatrics

## 2023-09-26 VITALS — BP 118/77 | HR 82 | Temp 98.0°F | Wt 167.8 lb

## 2023-09-26 DIAGNOSIS — R42 Dizziness and giddiness: Secondary | ICD-10-CM

## 2023-09-26 DIAGNOSIS — I4892 Unspecified atrial flutter: Secondary | ICD-10-CM | POA: Diagnosis not present

## 2023-09-26 DIAGNOSIS — Z79899 Other long term (current) drug therapy: Secondary | ICD-10-CM

## 2023-09-26 DIAGNOSIS — G629 Polyneuropathy, unspecified: Secondary | ICD-10-CM | POA: Diagnosis not present

## 2023-09-26 DIAGNOSIS — E876 Hypokalemia: Secondary | ICD-10-CM

## 2023-09-26 DIAGNOSIS — I2510 Atherosclerotic heart disease of native coronary artery without angina pectoris: Secondary | ICD-10-CM

## 2023-09-26 DIAGNOSIS — I4891 Unspecified atrial fibrillation: Secondary | ICD-10-CM

## 2023-09-26 NOTE — Patient Instructions (Addendum)
Goal weight for now can be 163lbs.   A1c looked good, we will continue monitor your sugars  Next week: - keep log of your heart rate - log when you feel bad

## 2023-09-26 NOTE — Progress Notes (Signed)
Office Visit  BP 118/77   Pulse 82   Temp 98 F (36.7 C) (Oral)   Wt 167 lb 12.8 oz (76.1 kg)   SpO2 96%   BMI 27.08 kg/m    Subjective:    Patient ID: Alison Richardson, female    DOB: 04-02-1933, 89 y.o.   MRN: [id number]  HPI: Sanita Schepps is a 89 y.o. female  Chief Complaint  Patient presents with   Dizziness    Pt states since stopping the medication she has been feeling fine   Dizziness Recently decreased gabapentin given c/f polypharmacy and dizziness Lenna Gilford, PharmD re-connected with pill packaging for the [facility] Wants to stop farxiga due to urinary frequency, has been feeling better Since lowered gabapentin dose, dizziness has resolved. Having intermittent neuropathy but she notes this is tolerable for her Taking gabapentin 100mg  at bedtime now  Weight concerns Pt concerned about weight gain from diet changes at her assisted living No chest pain, sob, palpations, orthopnea, dyspnea, PND, lower extremity edema. See weight trend below Intermittently checking sugars all below 180 PT x2/week. Walks about a mile a day Wt Readings from Last 3 Encounters:  09/26/23 167 lb 12.8 oz (76.1 kg)  09/10/23 163 lb 12.8 oz (74.3 kg)  06/11/23 163 lb 9.6 oz (74.2 kg)   Relevant past medical, surgical, family and social history reviewed and updated as indicated. Interim medical history since our last visit reviewed. Allergies and medications reviewed and updated.  ROS per HPI unless specifically indicated above     Objective:    BP 118/77   Pulse 82   Temp 98 F (36.7 C) (Oral)   Wt 167 lb 12.8 oz (76.1 kg)   SpO2 96%   BMI 27.08 kg/m   Wt Readings from Last 3 Encounters:  09/26/23 167 lb 12.8 oz (76.1 kg)  09/10/23 163 lb 12.8 oz (74.3 kg)  06/11/23 163 lb 9.6 oz (74.2 kg)    Physical Exam Constitutional:      Appearance: Normal appearance.  HENT:     Head: Normocephalic and atraumatic.  Eyes:     Pupils: Pupils are equal, round, and reactive to light.   Cardiovascular:     Rate and Rhythm: Normal rate and regular rhythm.     Pulses: Normal pulses.     Heart sounds: Normal heart sounds.  Pulmonary:     Effort: Pulmonary effort is normal.     Breath sounds: Normal breath sounds.  Abdominal:     General: Abdomen is flat.     Palpations: Abdomen is soft.  Musculoskeletal:        General: Normal range of motion.     Cervical back: Normal range of motion.  Skin:    General: Skin is warm and dry.     Capillary Refill: Capillary refill takes less than 2 seconds.  Neurological:     General: No focal deficit present.     Mental Status: She is alert. Mental status is at baseline.  Psychiatric:        Mood and Affect: Mood normal.        Behavior: Behavior normal.         04/09/2023   11:04 AM 01/15/2023    3:47 PM 08/21/2022    1:47 PM 03/20/2022    9:35 AM 01/22/2022    5:07 PM  Depression screen PHQ 2/9  Decreased Interest 0 0 0 0 1  Down, Depressed, Hopeless 0 0 0 0 0  PHQ -  2 Score 0 0 0 0 1  Altered sleeping 0 3   3  Tired, decreased energy 0 3   3  Change in appetite 0 0   2  Feeling bad or failure about yourself  0 0   0  Trouble concentrating 0 0   0  Moving slowly or fidgety/restless 0 0   0  Suicidal thoughts 0 0   0  PHQ-9 Score 0 6   9  Difficult doing work/chores Not difficult at all Not difficult at all   Somewhat difficult      09/10/2023    5:03 PM 01/15/2023    3:48 PM 08/21/2022    1:48 PM 01/11/2022   10:23 AM  GAD 7 : Generalized Anxiety Score  Nervous, Anxious, on Edge  0 0 1  Control/stop worrying  0 1 1  Worry too much - different things  0 1 1  Trouble relaxing  0 0 0  Restless  0 0 0  Easily annoyed or irritable  0 0 0  Afraid - awful might happen  0 0 0  Total GAD 7 Score  0 2 3  Anxiety Difficulty Somewhat difficult Not difficult at all Somewhat difficult Somewhat difficult      Assessment & Plan:  Assessment & Plan   Dizziness Resolved w lower dose gabapentin. Continue to monitor. Exam  reassuring.  Atrial fibrillation and flutter (HCC) Coronary artery disease without angina pectoris, unspecified vessel or lesion type, unspecified whether native or transplanted heart Would like to re-establish with [facility]. She has complex cardiac hx. Hoping to consolidate medication. Last cardiologist was consider dc-ing amiodarone, but will defer to specialty recommendations. Pt self discontinued farxiga due to frequent urination. Referral placed. -     Ambulatory referral to Cardiology  Hypokalemia Noted on recent blood work. Will repeat today. -     Basic metabolic panel  Neuropathy Tolerable on gabapentin 100qhs. Continue to monitor.  Polypharmacy Will schedule video conference to review medications as there were a few inconsistencies listed today. Pharmacy consulted in past for pill pack.  Today's visit encompasses complex care management of above conditions as part of ongoing care as primary care doctor and longitudinal relationship.   Follow up plan: Return in about 8 days (around 10/04/2023) for med review.  Nevena Rozenberg Howell Pringle, MD

## 2023-09-27 DIAGNOSIS — D044 Carcinoma in situ of skin of scalp and neck: Secondary | ICD-10-CM | POA: Diagnosis not present

## 2023-09-27 LAB — BASIC METABOLIC PANEL
BUN/Creatinine Ratio: 16 (ref 12–28)
BUN: 13 mg/dL (ref 8–27)
CO2: 21 mmol/L (ref 20–29)
Calcium: 9 mg/dL (ref 8.7–10.3)
Chloride: 103 mmol/L (ref 96–106)
Creatinine, Ser: 0.82 mg/dL (ref 0.57–1.00)
Glucose: 163 mg/dL — ABNORMAL HIGH (ref 70–99)
Potassium: 3.4 mmol/L — ABNORMAL LOW (ref 3.5–5.2)
Sodium: 144 mmol/L (ref 134–144)
eGFR: 69 mL/min/{1.73_m2} (ref >59)

## 2023-09-29 ENCOUNTER — Encounter: Payer: Self-pay | Admitting: Pediatrics

## 2023-10-03 ENCOUNTER — Telehealth: Payer: Medicare PPO | Admitting: Pediatrics

## 2023-10-04 NOTE — Telephone Encounter (Signed)
Called and scheduled patient on 10/10/2023.  Pt was stating that to reschedule she would prefer a telephone call not to do the office visit over the phone.

## 2023-10-06 NOTE — Progress Notes (Signed)
Appt set for [facility] / fitting 11/1 10am  Alison Richardson

## 2023-10-10 ENCOUNTER — Encounter: Payer: Self-pay | Admitting: Pediatrics

## 2023-10-10 ENCOUNTER — Ambulatory Visit: Payer: Medicare PPO | Admitting: Pediatrics

## 2023-10-10 VITALS — BP 132/79 | HR 98 | Temp 97.9°F | Wt 166.0 lb

## 2023-10-10 DIAGNOSIS — Z79899 Other long term (current) drug therapy: Secondary | ICD-10-CM

## 2023-10-10 NOTE — Progress Notes (Signed)
Office Visit  BP 132/79   Pulse 98   Temp 97.9 F (36.6 C) (Oral)   Wt 166 lb (75.3 kg)   SpO2 96%   BMI 26.79 kg/m    Subjective:    Patient ID: Alison Richardson, female    DOB: [birth year], 89 y.o.   MRN: [id number]  HPI: Alison Richardson is a 89 y.o. female  Chief Complaint  Patient presents with   Medication Management   Reviewed medications with patient and daughter She is interested in consolidating medications where able No recent falls Gabapentin reduced dose is tolerable for neuropathy, no more dizziness  Relevant past medical, surgical, family and social history reviewed and updated as indicated. Interim medical history since our last visit reviewed. Allergies and medications reviewed and updated.  ROS per HPI unless specifically indicated above     Objective:    BP 132/79   Pulse 98   Temp 97.9 F (36.6 C) (Oral)   Wt 166 lb (75.3 kg)   SpO2 96%   BMI 26.79 kg/m   Wt Readings from Last 3 Encounters:  10/10/23 166 lb (75.3 kg)  09/26/23 167 lb 12.8 oz (76.1 kg)  09/10/23 163 lb 12.8 oz (74.3 kg)     Physical Exam Constitutional:      Appearance: Normal appearance.  Eyes:     Pupils: Pupils are equal, round, and reactive to light.  Cardiovascular:     Rate and Rhythm: Normal rate and regular rhythm.     Pulses: Normal pulses.     Heart sounds: Normal heart sounds.  Pulmonary:     Effort: Pulmonary effort is normal.  Musculoskeletal:        General: Normal range of motion.  Skin:    Comments: Normal skin color  Neurological:     General: No focal deficit present.     Mental Status: She is alert. Mental status is at baseline.  Psychiatric:        Mood and Affect: Mood normal.        Behavior: Behavior normal.        Thought Content: Thought content normal.         Assessment & Plan:  Assessment & Plan   Polypharmacy Reviewed medications. Confirmed administration and dosages. Discontinued farxiga on her own. Discussed upcoming  cardiology visit to discuss d/c amiodarone. She does not qualify for reduced dosed eliquis. Plan to check in after cardiology visit.   Follow up plan: Return in about 6 months (around 04/09/2024) for AWV.  Tana Trefry Howell Pringle, MD

## 2023-10-10 NOTE — Patient Instructions (Signed)
Good to see you

## 2023-10-13 ENCOUNTER — Encounter: Payer: Self-pay | Admitting: Pediatrics

## 2023-10-14 DIAGNOSIS — M25512 Pain in left shoulder: Secondary | ICD-10-CM | POA: Diagnosis not present

## 2023-10-14 DIAGNOSIS — R2681 Unsteadiness on feet: Secondary | ICD-10-CM | POA: Diagnosis not present

## 2023-10-14 DIAGNOSIS — M25461 Effusion, right knee: Secondary | ICD-10-CM | POA: Diagnosis not present

## 2023-10-14 DIAGNOSIS — R2689 Other abnormalities of gait and mobility: Secondary | ICD-10-CM | POA: Diagnosis not present

## 2023-10-14 DIAGNOSIS — Z9181 History of falling: Secondary | ICD-10-CM | POA: Diagnosis not present

## 2023-10-14 DIAGNOSIS — M25561 Pain in right knee: Secondary | ICD-10-CM | POA: Diagnosis not present

## 2023-10-14 DIAGNOSIS — M6281 Muscle weakness (generalized): Secondary | ICD-10-CM | POA: Diagnosis not present

## 2023-10-16 DIAGNOSIS — R2689 Other abnormalities of gait and mobility: Secondary | ICD-10-CM | POA: Diagnosis not present

## 2023-10-16 DIAGNOSIS — R2681 Unsteadiness on feet: Secondary | ICD-10-CM | POA: Diagnosis not present

## 2023-10-16 DIAGNOSIS — M25561 Pain in right knee: Secondary | ICD-10-CM | POA: Diagnosis not present

## 2023-10-16 DIAGNOSIS — Z9181 History of falling: Secondary | ICD-10-CM | POA: Diagnosis not present

## 2023-10-16 DIAGNOSIS — M25461 Effusion, right knee: Secondary | ICD-10-CM | POA: Diagnosis not present

## 2023-10-16 DIAGNOSIS — M25512 Pain in left shoulder: Secondary | ICD-10-CM | POA: Diagnosis not present

## 2023-10-16 DIAGNOSIS — M6281 Muscle weakness (generalized): Secondary | ICD-10-CM | POA: Diagnosis not present

## 2023-10-21 DIAGNOSIS — M25512 Pain in left shoulder: Secondary | ICD-10-CM | POA: Diagnosis not present

## 2023-10-21 DIAGNOSIS — M6281 Muscle weakness (generalized): Secondary | ICD-10-CM | POA: Diagnosis not present

## 2023-10-21 DIAGNOSIS — Z9181 History of falling: Secondary | ICD-10-CM | POA: Diagnosis not present

## 2023-10-21 DIAGNOSIS — M25461 Effusion, right knee: Secondary | ICD-10-CM | POA: Diagnosis not present

## 2023-10-21 DIAGNOSIS — R2681 Unsteadiness on feet: Secondary | ICD-10-CM | POA: Diagnosis not present

## 2023-10-21 DIAGNOSIS — R2689 Other abnormalities of gait and mobility: Secondary | ICD-10-CM | POA: Diagnosis not present

## 2023-10-21 DIAGNOSIS — M25561 Pain in right knee: Secondary | ICD-10-CM | POA: Diagnosis not present

## 2023-10-23 DIAGNOSIS — M6281 Muscle weakness (generalized): Secondary | ICD-10-CM | POA: Diagnosis not present

## 2023-10-23 DIAGNOSIS — R2689 Other abnormalities of gait and mobility: Secondary | ICD-10-CM | POA: Diagnosis not present

## 2023-10-23 DIAGNOSIS — M25512 Pain in left shoulder: Secondary | ICD-10-CM | POA: Diagnosis not present

## 2023-10-23 DIAGNOSIS — Z9181 History of falling: Secondary | ICD-10-CM | POA: Diagnosis not present

## 2023-10-23 DIAGNOSIS — M25461 Effusion, right knee: Secondary | ICD-10-CM | POA: Diagnosis not present

## 2023-10-23 DIAGNOSIS — R2681 Unsteadiness on feet: Secondary | ICD-10-CM | POA: Diagnosis not present

## 2023-10-23 DIAGNOSIS — M25561 Pain in right knee: Secondary | ICD-10-CM | POA: Diagnosis not present

## 2023-10-25 ENCOUNTER — Ambulatory Visit: Payer: Medicare PPO

## 2023-10-25 NOTE — Progress Notes (Signed)
Shoes were too tight and short sent back and reordered half sz larger and next width up

## 2023-10-30 DIAGNOSIS — Z9181 History of falling: Secondary | ICD-10-CM | POA: Diagnosis not present

## 2023-10-30 DIAGNOSIS — R2689 Other abnormalities of gait and mobility: Secondary | ICD-10-CM | POA: Diagnosis not present

## 2023-10-30 DIAGNOSIS — M25461 Effusion, right knee: Secondary | ICD-10-CM | POA: Diagnosis not present

## 2023-10-30 DIAGNOSIS — M25512 Pain in left shoulder: Secondary | ICD-10-CM | POA: Diagnosis not present

## 2023-10-30 DIAGNOSIS — M6281 Muscle weakness (generalized): Secondary | ICD-10-CM | POA: Diagnosis not present

## 2023-10-30 DIAGNOSIS — R2681 Unsteadiness on feet: Secondary | ICD-10-CM | POA: Diagnosis not present

## 2023-10-30 DIAGNOSIS — M25561 Pain in right knee: Secondary | ICD-10-CM | POA: Diagnosis not present

## 2023-10-31 ENCOUNTER — Other Ambulatory Visit: Payer: Self-pay | Admitting: Nurse Practitioner

## 2023-10-31 NOTE — Telephone Encounter (Signed)
Requested Prescriptions  Pending Prescriptions Disp Refills   levothyroxine (SYNTHROID) 88 MCG tablet [Pharmacy Med Name: LEVOTHYROXINE SODIUM 88 MCG TAB] 90 tablet 3    Sig: TAKE 1 TABLET EVERY DAY ON EMPTY STOMACHWITH A GLASS OF WATER AT LEAST 30-60 MINBEFORE BREAKFAST     Endocrinology:  Hypothyroid Agents Passed - 10/31/2023 11:43 AM      Passed - TSH in normal range and within 360 days    TSH  Date Value Ref Range Status  09/03/2023 3.410 0.450 - 4.500 uIU/mL Final         Passed - Valid encounter within last 12 months    Recent Outpatient Visits           3 weeks ago Polypharmacy   [place] [facility] Jackolyn Confer, MD   1 month ago Dizziness   [facility] Jackolyn Confer, MD   1 month ago Dizziness   [facility] [facility] Jackolyn Confer, MD   1 month ago Dysuria   [facility] Jackolyn Confer, MD   4 months ago Puncture wound of forehead, initial encounter   [place] [facility] [place], Sherran Needs, NP       Future Appointments             In 1 month Gollan, Tollie Pizza, MD [facility] Prescriptions Disp Refills   metoprolol succinate (TOPROL-XL) 100 MG 24 hr tablet [Pharmacy Med Name: METOPROLOL SUCCINATE ER 100 MG TAB] 90 tablet 3    Sig: TAKE ONE TABLET (100 MG) BY MOUTH EVERY DAY (TAKE WITH OR IMMEDIATELY AFTER A MEAL)     Cardiovascular:  Beta Blockers Passed - 10/31/2023 11:43 AM      Passed - Last BP in normal range    BP Readings from Last 1 Encounters:  10/10/23 132/79         Passed - Last Heart Rate in normal range    Pulse Readings from Last 1 Encounters:  10/10/23 98         Passed - Valid encounter within last 6 months    Recent Outpatient Visits           3 weeks ago Polypharmacy   [place] [facility] Jackolyn Confer, MD   1 month ago Dizziness   [place] [facility] Jackolyn Confer, MD   1 month ago Dizziness   [place] [facility] Jackolyn Confer, MD   1 month ago Dysuria   [place] [facility] Jackolyn Confer, MD   4 months ago Puncture wound of forehead, initial encounter    [facility] [place], Sherran Needs, NP       Future Appointments             In 1 month Gollan, Tollie Pizza, MD [facility]

## 2023-11-11 DIAGNOSIS — M25461 Effusion, right knee: Secondary | ICD-10-CM | POA: Diagnosis not present

## 2023-11-11 DIAGNOSIS — M6281 Muscle weakness (generalized): Secondary | ICD-10-CM | POA: Diagnosis not present

## 2023-11-11 DIAGNOSIS — Z9181 History of falling: Secondary | ICD-10-CM | POA: Diagnosis not present

## 2023-11-11 DIAGNOSIS — R2681 Unsteadiness on feet: Secondary | ICD-10-CM | POA: Diagnosis not present

## 2023-11-11 DIAGNOSIS — M25561 Pain in right knee: Secondary | ICD-10-CM | POA: Diagnosis not present

## 2023-11-13 DIAGNOSIS — R2681 Unsteadiness on feet: Secondary | ICD-10-CM | POA: Diagnosis not present

## 2023-11-13 DIAGNOSIS — M25512 Pain in left shoulder: Secondary | ICD-10-CM | POA: Diagnosis not present

## 2023-11-13 DIAGNOSIS — Z9181 History of falling: Secondary | ICD-10-CM | POA: Diagnosis not present

## 2023-11-13 DIAGNOSIS — R2689 Other abnormalities of gait and mobility: Secondary | ICD-10-CM | POA: Diagnosis not present

## 2023-11-13 DIAGNOSIS — M25561 Pain in right knee: Secondary | ICD-10-CM | POA: Diagnosis not present

## 2023-11-13 DIAGNOSIS — M25461 Effusion, right knee: Secondary | ICD-10-CM | POA: Diagnosis not present

## 2023-11-13 DIAGNOSIS — M6281 Muscle weakness (generalized): Secondary | ICD-10-CM | POA: Diagnosis not present

## 2023-11-15 ENCOUNTER — Other Ambulatory Visit: Payer: Self-pay | Admitting: Nurse Practitioner

## 2023-11-18 NOTE — Telephone Encounter (Signed)
[facility] called and spoke to [facility] about the refill(s) metoprolol  requested. Advised it was sent on 09/06/23 #90/3 refill(s). She states it was not received, reviewed chart and rx was selected as "print" so advised I would send electronically. No further assistance noted.   Requested Prescriptions  Pending Prescriptions Disp Refills   metoprolol succinate (TOPROL-XL) 100 MG 24 hr tablet [Pharmacy Med Name: METOPROLOL SUCCINATE ER 100 MG TAB] 90 tablet 3    Sig: TAKE ONE TABLET (100 MG) BY MOUTH EVERY DAY (TAKE WITH OR IMMEDIATELY AFTER A MEAL)     Cardiovascular:  Beta Blockers Passed - 11/15/2023  2:16 PM      Passed - Last BP in normal range    BP Readings from Last 1 Encounters:  10/10/23 132/79         Passed - Last Heart Rate in normal range    Pulse Readings from Last 1 Encounters:  10/10/23 98         Passed - Valid encounter within last 6 months    Recent Outpatient Visits           1 month ago Polypharmacy   [place] [facility] Jackolyn Confer, MD   1 month ago Dizziness   [place] [facility] Jackolyn Confer, MD   2 months ago Dizziness   [facility] Jackolyn Confer, MD   2 months ago Dysuria   [place] [facility] Jackolyn Confer, MD   5 months ago Puncture wound of forehead, initial encounter   [place] [facility] [place], Sherran Needs, NP       Future Appointments             In 3 weeks Gollan, Tollie Pizza, MD [facility]

## 2023-11-19 DIAGNOSIS — R2689 Other abnormalities of gait and mobility: Secondary | ICD-10-CM | POA: Diagnosis not present

## 2023-11-19 DIAGNOSIS — M25561 Pain in right knee: Secondary | ICD-10-CM | POA: Diagnosis not present

## 2023-11-19 DIAGNOSIS — M6281 Muscle weakness (generalized): Secondary | ICD-10-CM | POA: Diagnosis not present

## 2023-11-19 DIAGNOSIS — M25512 Pain in left shoulder: Secondary | ICD-10-CM | POA: Diagnosis not present

## 2023-11-19 DIAGNOSIS — M25461 Effusion, right knee: Secondary | ICD-10-CM | POA: Diagnosis not present

## 2023-11-19 DIAGNOSIS — R2681 Unsteadiness on feet: Secondary | ICD-10-CM | POA: Diagnosis not present

## 2023-11-19 DIAGNOSIS — Z9181 History of falling: Secondary | ICD-10-CM | POA: Diagnosis not present

## 2023-11-20 DIAGNOSIS — M25512 Pain in left shoulder: Secondary | ICD-10-CM | POA: Diagnosis not present

## 2023-11-20 DIAGNOSIS — M6281 Muscle weakness (generalized): Secondary | ICD-10-CM | POA: Diagnosis not present

## 2023-11-20 DIAGNOSIS — R2681 Unsteadiness on feet: Secondary | ICD-10-CM | POA: Diagnosis not present

## 2023-11-20 DIAGNOSIS — Z9181 History of falling: Secondary | ICD-10-CM | POA: Diagnosis not present

## 2023-11-20 DIAGNOSIS — R2689 Other abnormalities of gait and mobility: Secondary | ICD-10-CM | POA: Diagnosis not present

## 2023-11-20 DIAGNOSIS — M25461 Effusion, right knee: Secondary | ICD-10-CM | POA: Diagnosis not present

## 2023-11-20 DIAGNOSIS — M25561 Pain in right knee: Secondary | ICD-10-CM | POA: Diagnosis not present

## 2023-11-25 DIAGNOSIS — Z9181 History of falling: Secondary | ICD-10-CM | POA: Diagnosis not present

## 2023-11-25 DIAGNOSIS — R2689 Other abnormalities of gait and mobility: Secondary | ICD-10-CM | POA: Diagnosis not present

## 2023-11-25 DIAGNOSIS — R2681 Unsteadiness on feet: Secondary | ICD-10-CM | POA: Diagnosis not present

## 2023-11-25 DIAGNOSIS — M6281 Muscle weakness (generalized): Secondary | ICD-10-CM | POA: Diagnosis not present

## 2023-11-25 DIAGNOSIS — M25512 Pain in left shoulder: Secondary | ICD-10-CM | POA: Diagnosis not present

## 2023-12-03 DIAGNOSIS — Z9181 History of falling: Secondary | ICD-10-CM | POA: Diagnosis not present

## 2023-12-03 DIAGNOSIS — M25512 Pain in left shoulder: Secondary | ICD-10-CM | POA: Diagnosis not present

## 2023-12-03 DIAGNOSIS — R2681 Unsteadiness on feet: Secondary | ICD-10-CM | POA: Diagnosis not present

## 2023-12-03 DIAGNOSIS — M6281 Muscle weakness (generalized): Secondary | ICD-10-CM | POA: Diagnosis not present

## 2023-12-03 DIAGNOSIS — R2689 Other abnormalities of gait and mobility: Secondary | ICD-10-CM | POA: Diagnosis not present

## 2023-12-04 DIAGNOSIS — Z9181 History of falling: Secondary | ICD-10-CM | POA: Diagnosis not present

## 2023-12-04 DIAGNOSIS — R2681 Unsteadiness on feet: Secondary | ICD-10-CM | POA: Diagnosis not present

## 2023-12-04 DIAGNOSIS — R2689 Other abnormalities of gait and mobility: Secondary | ICD-10-CM | POA: Diagnosis not present

## 2023-12-04 DIAGNOSIS — M6281 Muscle weakness (generalized): Secondary | ICD-10-CM | POA: Diagnosis not present

## 2023-12-04 DIAGNOSIS — M25512 Pain in left shoulder: Secondary | ICD-10-CM | POA: Diagnosis not present

## 2023-12-06 ENCOUNTER — Ambulatory Visit: Payer: Medicare PPO | Admitting: Family Medicine

## 2023-12-06 ENCOUNTER — Other Ambulatory Visit: Payer: Medicare PPO

## 2023-12-06 ENCOUNTER — Encounter: Payer: Self-pay | Admitting: Family Medicine

## 2023-12-06 ENCOUNTER — Ambulatory Visit: Payer: Self-pay

## 2023-12-06 VITALS — BP 136/75 | HR 62 | Wt 170.8 lb

## 2023-12-06 DIAGNOSIS — J069 Acute upper respiratory infection, unspecified: Secondary | ICD-10-CM | POA: Diagnosis not present

## 2023-12-06 DIAGNOSIS — J189 Pneumonia, unspecified organism: Secondary | ICD-10-CM

## 2023-12-06 DIAGNOSIS — J180 Bronchopneumonia, unspecified organism: Secondary | ICD-10-CM | POA: Diagnosis not present

## 2023-12-06 MED ORDER — AMOXICILLIN-POT CLAVULANATE 875-125 MG PO TABS
1.0000 | ORAL_TABLET | Freq: Two times a day (BID) | ORAL | 0 refills | Status: DC
Start: 2023-12-06 — End: 2024-03-30

## 2023-12-06 MED ORDER — PREDNISONE 10 MG PO TABS: ORAL_TABLET | ORAL | 0 refills | Status: DC

## 2023-12-06 MED ORDER — ALBUTEROL SULFATE (2.5 MG/3ML) 0.083% IN NEBU
2.5000 mg | INHALATION_SOLUTION | Freq: Once | RESPIRATORY_TRACT | Status: AC
  Administered 2023-12-06: 2.5 mg via RESPIRATORY_TRACT

## 2023-12-06 NOTE — Telephone Encounter (Signed)
Pt is being seen today by Dr. Laural Benes @ 4:00 pm.

## 2023-12-06 NOTE — Progress Notes (Signed)
BP 136/75   Pulse 62   Wt 170 lb 12.8 oz (77.5 kg)   SpO2 93%   BMI 27.57 kg/m    Subjective:    Patient ID: Alison Richardson, female    DOB: [birth year], 89 y.o.   MRN: [id number]  HPI: Alison Richardson is a 89 y.o. female  Chief Complaint  Patient presents with   Shortness of Breath   Asthma    Patient says she woke up this morning with an Asthma attack and she used her Sprivia inhaler. Patient says she wheezing really bad with crackling, coughing, and short of breath with exertion. Patient says she last did her inhaler at 3 pm, she felt a little better. Patient says she feels better now than she has all day. Patient was swabbed at the nursing facility this morning by nurse and was negative.    UPPER RESPIRATORY TRACT INFECTION Duration: today Worst symptom: coughing and wheezing Fever: no Cough: yes Shortness of breath: yes Wheezing: yes Chest pain: no Chest tightness: yes Chest congestion: no Nasal congestion: yes Runny nose: yes Post nasal drip: yes Sneezing: no Sore throat: no Swollen glands: no Sinus pressure: no Headache: yes Face pain: no Toothache: no Ear pain: no  Ear pressure: no  Eyes red/itching:no Eye drainage/crusting: no  Vomiting: yes Rash: no Fatigue: yes Sick contacts: no Strep contacts: no  Context: worse Recurrent sinusitis: no Relief with OTC cold/cough medications: no  Treatments attempted: inhaler   Relevant past medical, surgical, family and social history reviewed and updated as indicated. Interim medical history since our last visit reviewed. Allergies and medications reviewed and updated.  Review of Systems  Constitutional: Negative.   HENT: Negative.    Respiratory:  Positive for cough, chest tightness, shortness of breath and wheezing. Negative for apnea, choking and stridor.   Cardiovascular: Negative.   Gastrointestinal: Negative.   Psychiatric/Behavioral: Negative.      Per HPI unless specifically indicated above      Objective:    BP 136/75   Pulse 62   Wt 170 lb 12.8 oz (77.5 kg)   SpO2 93%   BMI 27.57 kg/m   Wt Readings from Last 3 Encounters:  12/06/23 170 lb 12.8 oz (77.5 kg)  10/10/23 166 lb (75.3 kg)  09/26/23 167 lb 12.8 oz (76.1 kg)    Physical Exam Vitals and nursing note reviewed.  Constitutional:      General: She is not in acute distress.    Appearance: Normal appearance. She is not ill-appearing, toxic-appearing or diaphoretic.  HENT:     Head: Normocephalic and atraumatic.     Right Ear: External ear normal.     Left Ear: External ear normal.     Nose: Nose normal.     Mouth/Throat:     Mouth: Mucous membranes are moist.     Pharynx: Oropharynx is clear.  Eyes:     General: No scleral icterus.       Right eye: No discharge.        Left eye: No discharge.     Extraocular Movements: Extraocular movements intact.     Conjunctiva/sclera: Conjunctivae normal.     Pupils: Pupils are equal, round, and reactive to light.  Cardiovascular:     Rate and Rhythm: Normal rate and regular rhythm.     Pulses: Normal pulses.     Heart sounds: Normal heart sounds. No murmur heard.    No friction rub. No gallop.  Pulmonary:     Effort: Pulmonary  effort is normal. No respiratory distress.     Breath sounds: No stridor. Examination of the right-upper field reveals wheezing. Examination of the left-upper field reveals wheezing. Examination of the right-middle field reveals wheezing. Examination of the left-middle field reveals wheezing. Examination of the right-lower field reveals wheezing and rhonchi. Examination of the left-lower field reveals wheezing. Wheezing and rhonchi present. No rales.  Chest:     Chest wall: No tenderness.  Musculoskeletal:        General: Normal range of motion.     Cervical back: Normal range of motion and neck supple.  Skin:    General: Skin is warm and dry.     Capillary Refill: Capillary refill takes less than 2 seconds.     Coloration: Skin is not  jaundiced or pale.     Findings: No bruising, erythema, lesion or rash.  Neurological:     General: No focal deficit present.     Mental Status: She is alert and oriented to person, place, and time. Mental status is at baseline.  Psychiatric:        Mood and Affect: Mood normal.        Behavior: Behavior normal.        Thought Content: Thought content normal.        Judgment: Judgment normal.     Results for orders placed or performed in visit on 09/26/23  Basic Metabolic Panel (BMET)   Collection Time: 09/26/23  2:57 PM  Result Value Ref Range   Glucose 163 (H) 70 - 99 mg/dL   BUN 13 8 - 27 mg/dL   Creatinine, Ser 1.47 0.57 - 1.00 mg/dL   eGFR 69 >82 NF/AOZ/3.08   BUN/Creatinine Ratio 16 12 - 28   Sodium 144 134 - 144 mmol/L   Potassium 3.4 (L) 3.5 - 5.2 mmol/L   Chloride 103 96 - 106 mmol/L   CO2 21 20 - 29 mmol/L   Calcium 9.0 8.7 - 10.3 mg/dL      Assessment & Plan:   Problem List Items Addressed This Visit   None Visit Diagnoses       Pneumonia of right lower lobe due to infectious organism    -  Primary   Will treat with augmentin and steroid taper. Recheck with me or Dr. Evelene Croon in 2 weeks. Call with any concerns.   Relevant Medications   Tiotropium Bromide Monohydrate (SPIRIVA RESPIMAT) 1.25 MCG/ACT AERS   albuterol (PROVENTIL) (2.5 MG/3ML) 0.083% nebulizer solution 2.5 mg (Completed)   amoxicillin-clavulanate (AUGMENTIN) 875-125 MG tablet   Other Relevant Orders   Veritor Flu A/B Waived   Novel Coronavirus, NAA ([facility])   Rapid Strep screen([facility])        Follow up plan: Return in about 2 weeks (around 12/20/2023) for with me or Dr. Evelene Croon.

## 2023-12-06 NOTE — Telephone Encounter (Signed)
  Chief Complaint: SOB Symptoms: Mild moderate SOB - congestion Frequency: Today Pertinent Negatives: Patient denies Feer other s/s  Disposition: [] ED /[x] Urgent Care (no appt availability in office) / [] Appointment(In office/virtual)/ []  [facility]/ [] Home Care/ [] Refused Recommended Disposition /[] [place] [facility]/ []  Follow-up with PCP Additional Notes: Pt was fine yesterday and today woke up SOB. Pt has mild congestion. Pt would like to be worked in this afternoon in the office. Teams message sent to [facility].  Please advise.    Reason for Disposition  [1] MILD difficulty breathing (e.g., minimal/no SOB at rest, SOB with walking, pulse <100) AND [2] NEW-onset or WORSE than normal  Answer Assessment - Initial Assessment Questions 1. RESPIRATORY STATUS: "Describe your breathing?" (e.g., wheezing, shortness of breath, unable to speak, severe coughing)      Shortness of breath 2. ONSET: "When did this breathing problem begin?"      This morning 3. PATTERN "Does the difficult breathing come and go, or has it been constant since it started?"      constant 4. SEVERITY: "How bad is your breathing?" (e.g., mild, moderate, severe)    - MILD: No SOB at rest, mild SOB with walking, speaks normally in sentences, can lie down, no retractions, pulse < 100.    - MODERATE: SOB at rest, SOB with minimal exertion and prefers to sit, cannot lie down flat, speaks in phrases, mild retractions, audible wheezing, pulse 100-120.    - SEVERE: Very SOB at rest, speaks in single words, struggling to breathe, sitting hunched forward, retractions, pulse > 120      moderate 5. RECURRENT SYMPTOM: "Have you had difficulty breathing before?" If Yes, ask: "When was the last time?" and "What happened that time?"      When she had COVID 6. CARDIAC HISTORY: "Do you have any history of heart disease?" (e.g., heart attack, angina, bypass surgery, angioplasty)      Heart failure 7. LUNG HISTORY: "Do you have  any history of lung disease?"  (e.g., pulmonary embolus, asthma, emphysema)     no 8. CAUSE: "What do you think is causing the breathing problem?"      no 9. OTHER SYMPTOMS: "Do you have any other symptoms? (e.g., dizziness, runny nose, cough, chest pain, fever)     Congestion  Protocols used: Breathing Difficulty-A-AH

## 2023-12-08 NOTE — Progress Notes (Unsigned)
Cardiology Office Note  Date:  12/09/2023   ID:  Alison Richardson, DOB 09-29-1933, MRN [id number]  PCP:  Jackolyn Confer, MD   Chief Complaint  Patient presents with   New Patient (Initial Visit)    Establish care for CAD/PAD/CHF/A-Fib; Former Dr. Darrold Junker patient at [facility] clinic. Medications reviewed by the patient verbally.     HPI:  [name] is a 89 year old woman with past medical history of Coronary artery disease, Subtotaled ostial RCA treated medically 10/12/2021 Persistent atrial fibrillation with RVR February 2023 in the setting of sepsis E. coli bacteremia Chronic diastolic congestive heart failure NYHA class I-II HFpEF 55-60 % 04/21/2023, admission December 2023 Essential hypertension Hyperlipidemia Type 2 diabetes Hypothyroidism Moderate mitral regurgitation April 2024 Moderate-severe tricuspid regurgitation April 2024 Previously followed at [facility] Who presents for new patient evaluation of her coronary artery disease, atrial fibrillation  In follow-up today she reports feeling relatively well Presents in a wheelchair, reports that her gait is poor, she uses a walker at home Lives in independent living, has a caretaker, [place]  Review of her cardiac arrhythmia Atrial fibrillation first noted February 2023 Again noted April 2023,  Holter monitor September 2023 persistent atrial fibrillation Event monitor 14-day May 2024 persistent atrial fibrillation Remains on amiodarone  She reports having no sx in atrial fibrillation Denies leg swelling, shortness of breath Interestingly is normal sinus rhythm today, she was unaware  Reports blood pressure typically well-controlled, denies orthostasis symptoms  EKG today showing normal sinus rhythm ventricular rate 48 bpm EKG Interpretation Date/Time:  Monday December 09 2023 11:12:30 EST Ventricular Rate:  48 PR Interval:  174 QRS Duration:  102 QT Interval:  568 QTC Calculation: 507 R  Axis:   -49  Text Interpretation: Sinus bradycardia Left axis deviation Minimal voltage criteria for LVH, may be normal variant ( Cornell product ) ST & T wave abnormality, consider anterolateral ischemia When compared with ECG of 20-Apr-2023 08:58, rhythm has changed Confirmed by Julien Nordmann [id number]) on 12/09/2023 11:17:09 AM     PMH:   has a past medical history of Acute on chronic congestive heart failure (HCC) (10/07/2020), Arrhythmia, Atrial flutter (HCC) (01/2018), Basal cell carcinoma of back, Basal cell carcinoma of lip, Cerebral aneurysm, CHF (congestive heart failure) (HCC), DDD (degenerative disc disease), lumbar, Diabetes mellitus type II, controlled (HCC), Diverticulosis, Dysrhythmia, Dysthymia, GERD (gastroesophageal reflux disease), History of meniscal tear, Humeral surgical neck fracture (05/16/2022), Hyperlipidemia, Hypertension, Hypokalemia (12/12/2022), Hypothyroidism, Late onset Alzheimer's disease with behavioral disturbance (HCC), Leukocytosis (10/07/2020), Mild pulmonary hypertension (HCC), Multifocal pneumonia (12/09/2022), Overactive bladder, Peripheral vascular disease (HCC), Puncture wound of forehead (06/11/2023), Seasonal allergic rhinitis, Severe sepsis (HCC) (02/07/2022), and Sleep apnea.  PSH:    Past Surgical History:  Procedure Laterality Date   APPENDECTOMY  1946   BASAL CELL CARCINOMA EXCISION     2006 and 2009 removed from back and lip   CARDIOVASCULAR STRESS TEST  2015   nuclear cardiac stress test negative for ischemia - Dr. Tonita Phoenix   CATARACT EXTRACTION, BILATERAL  2006   COLONOSCOPY  2014   COLONOSCOPY N/A 08/19/2020   Procedure: COLONOSCOPY;  Surgeon: Regis Bill, MD;  Location[facility] ENDOSCOPY;  Service: Endoscopy;  Laterality: N/A;   ECTOPIC PREGNANCY SURGERY  1957   ESOPHAGOGASTRODUODENOSCOPY N/A 08/19/2020   Procedure: ESOPHAGOGASTRODUODENOSCOPY (EGD);  Surgeon: Regis Bill, MD;  Location: [facility] ENDOSCOPY;  Service: Endoscopy;   Laterality: N/A;   INJECTION KNEE Left 05/2015   LEFT HEART CATH AND CORONARY ANGIOGRAPHY N/A 10/12/2021  Procedure: LEFT HEART CATH AND CORONARY ANGIOGRAPHY;  Surgeon: Lamar Blinks, MD;  Location: [facility] INVASIVE CV LAB;  Service: Cardiovascular;  Laterality: N/A;   REPLACEMENT TOTAL KNEE Left 09/06/2016   Dr. Suzette Battiest   TONSILLECTOMY AND ADENOIDECTOMY  1953   TOTAL ABDOMINAL HYSTERECTOMY  1986   DU B    Current Outpatient Medications  Medication Sig Dispense Refill   amiodarone (PACERONE) 200 MG tablet Take 100 mg by mouth daily.     amoxicillin-clavulanate (AUGMENTIN) 875-125 MG tablet Take 1 tablet by mouth 2 (two) times daily. 20 tablet 0   apixaban (ELIQUIS) 5 MG TABS tablet Take 5 mg by mouth 2 (two) times daily.     clopidogrel (PLAVIX) 75 MG tablet Take 75 mg by mouth daily.     diltiazem (CARDIZEM CD) 240 MG 24 hr capsule Take 240 mg by mouth daily.     donepezil (ARICEPT) 10 MG tablet TAKE ONE TABLET (10 MG) BY MOUTH AT BEDTIME 90 tablet 0   gabapentin (NEURONTIN) 100 MG capsule Take 1 capsule (100 mg total) by mouth at bedtime.     glipiZIDE (GLUCOTROL XL) 5 MG 24 hr tablet TAKE 1 TABLET BY MOUTH DAILY 90 tablet 0   levothyroxine (SYNTHROID) 88 MCG tablet TAKE 1 TABLET EVERY DAY ON EMPTY STOMACHWITH A GLASS OF WATER AT LEAST 30-60 MINBEFORE BREAKFAST 90 tablet 3   metoprolol succinate (TOPROL-XL) 100 MG 24 hr tablet TAKE ONE TABLET (100 MG) BY MOUTH EVERY DAY (TAKE WITH OR IMMEDIATELY AFTER A MEAL) 90 tablet 3   nitroGLYCERIN (NITROSTAT) 0.4 MG SL tablet Place 0.4 mg under the tongue every 5 (five) minutes as needed for chest pain.     pravastatin (PRAVACHOL) 20 MG tablet Take 20 mg by mouth at bedtime.     predniSONE (DELTASONE) 10 MG tablet 6 tabs in the am day 1, 5 tabs day 2, decrease by 1 every day until gone 21 tablet 0   Tiotropium Bromide Monohydrate (SPIRIVA RESPIMAT) 1.25 MCG/ACT AERS Inhale into the lungs.     torsemide (DEMADEX) 20 MG tablet Take 20 mg by mouth  daily.     valsartan (DIOVAN) 40 MG tablet TAKE ONE TABLET BY MOUTH EVERY DAY 90 tablet 0   venlafaxine XR (EFFEXOR-XR) 75 MG 24 hr capsule TAKE 1 CAPSULE BY MOUTH ONCE DAILY. 90 capsule 0   glucose blood (ACCU-CHEK GUIDE) test strip USE TO TEST ONCE DAILY AS DIRECTED (Patient not taking: Reported on 12/09/2023) 50 each 6   No current facility-administered medications for this visit.     Allergies:   Pantoprazole and Metformin and related   Social History:  The patient  reports that she has never smoked. She has never used smokeless tobacco. She reports that she does not currently use alcohol. She reports that she does not use drugs.   Family History:   family history includes CAD in her father; Diabetes in her paternal uncle and sister; Heart disease in her father and sister; Hypertension in her mother; Sleep apnea in her son.    Review of Systems: Review of Systems  Constitutional: Negative.   HENT: Negative.    Respiratory: Negative.    Cardiovascular: Negative.   Gastrointestinal: Negative.   Musculoskeletal: Negative.   Neurological: Negative.   Psychiatric/Behavioral: Negative.    All other systems reviewed and are negative.    PHYSICAL EXAM: VS:  BP 110/64 (BP Location: Right Arm, Patient Position: Sitting, Cuff Size: Normal)   Pulse (!) 48   Temp Marland Kitchen)  97.4 F (36.3 C)   Ht 5\' 6"  (1.676 m)   Wt 167 lb 8 oz (76 kg)   SpO2 95%   BMI 27.04 kg/m  , BMI Body mass index is 27.04 kg/m. GEN: Well nourished, well developed, in no acute distress HEENT: normal Neck: no JVD, carotid bruits, or masses Cardiac: RRR; no murmurs, rubs, or gallops,no edema  Respiratory:  clear to auscultation bilaterally, normal work of breathing GI: soft, nontender, nondistended, + BS MS: no deformity or atrophy Skin: warm and dry, no rash Neuro:  Strength and sensation are intact Psych: euthymic mood, full affect  Recent Labs: 04/20/2023: B Natriuretic Peptide 85.6; Magnesium  2.6 05/21/2023: ALT 19 09/03/2023: Hemoglobin CANCELED; Platelets CANCELED; TSH 3.410 09/26/2023: BUN 13; Creatinine, Ser 0.82; Potassium 3.4; Sodium 144    Lipid Panel Lab Results  Component Value Date   CHOL 211 (H) 04/16/2023   HDL 76 04/16/2023   LDLCALC 110 (H) 04/16/2023   TRIG 145 04/16/2023      Wt Readings from Last 3 Encounters:  12/09/23 167 lb 8 oz (76 kg)  12/06/23 170 lb 12.8 oz (77.5 kg)  10/10/23 166 lb (75.3 kg)       ASSESSMENT AND PLAN:  Problem List Items Addressed This Visit       Cardiology Problems   DM type 2 with diabetic mixed hyperlipidemia (HCC)   Atrial fibrillation, chronic (HCC)   Relevant Orders   EKG 12-Lead (Completed)   Hyperlipidemia associated with type 2 diabetes mellitus (HCC)   PAD (peripheral artery disease) (HCC)   Relevant Orders   EKG 12-Lead (Completed)   Other Visit Diagnoses       Chronic diastolic congestive heart failure (HCC)    -  Primary   Relevant Orders   EKG 12-Lead (Completed)     Benign essential hypertension          Persistent atrial fibrillation Atrial fibrillation starting February 2023, persistent it would appear through May 2024 Presenting today normal sinus rhythm, sinus bradycardia rates in the 40s Asymptomatic throughout We have recommended she decrease metoprolol succinate down to 50 mg daily from 100 given low blood pressure and bradycardia Continue amiodarone 200 daily, Eliquis 5 twice daily, diltiazem ER 240 daily Additional medication adjustments as needed for bradycardia or hypotension  COPD exacerbation Reports that she is on prednisone, antibiotics for recent bronchitis/Augmentin  Essential hypertension On valsartan 40 daily, diltiazem ER 240 daily metoprolol succinate down to 50 daily Recommend close monitoring of blood pressure If blood pressure continues to run low, additional adjustments may be needed  Chronic diastolic CHF Appears euvolemic, continue losartan, metoprolol,  torsemide 20 daily Normal renal function several months ago  Valvular heart disease Prior echocardiogram April 2024 with moderate MR, moderate to severe TR No significant murmurs appreciated on exam Now maintaining normal sinus rhythm which may improve valvular heart disease Repeat echocardiogram ordered for further evaluation  History of coronary artery disease Recommend she hold Plavix, continue Eliquis given significant bruising, no recent coronary intervention, no prior TIA or stroke  Diabetes type 2 A1c 7.2, managed by primary care   Signed, Dossie Arbour, M.D., Ph.D. [facility] [place], [place] [phone]

## 2023-12-09 ENCOUNTER — Other Ambulatory Visit: Payer: Self-pay | Admitting: Nurse Practitioner

## 2023-12-09 ENCOUNTER — Encounter: Payer: Self-pay | Admitting: Cardiovascular Disease

## 2023-12-09 ENCOUNTER — Ambulatory Visit: Payer: Medicare PPO | Attending: Cardiovascular Disease | Admitting: Cardiovascular Disease

## 2023-12-09 VITALS — BP 110/64 | HR 48 | Temp 97.4°F | Ht 66.0 in | Wt 167.5 lb

## 2023-12-09 DIAGNOSIS — I071 Rheumatic tricuspid insufficiency: Secondary | ICD-10-CM

## 2023-12-09 DIAGNOSIS — I10 Essential (primary) hypertension: Secondary | ICD-10-CM

## 2023-12-09 DIAGNOSIS — I5032 Chronic diastolic (congestive) heart failure: Secondary | ICD-10-CM

## 2023-12-09 DIAGNOSIS — I482 Chronic atrial fibrillation, unspecified: Secondary | ICD-10-CM

## 2023-12-09 DIAGNOSIS — E785 Hyperlipidemia, unspecified: Secondary | ICD-10-CM | POA: Diagnosis not present

## 2023-12-09 DIAGNOSIS — E782 Mixed hyperlipidemia: Secondary | ICD-10-CM | POA: Diagnosis not present

## 2023-12-09 DIAGNOSIS — E1169 Type 2 diabetes mellitus with other specified complication: Secondary | ICD-10-CM

## 2023-12-09 DIAGNOSIS — I340 Nonrheumatic mitral (valve) insufficiency: Secondary | ICD-10-CM | POA: Diagnosis not present

## 2023-12-09 DIAGNOSIS — I739 Peripheral vascular disease, unspecified: Secondary | ICD-10-CM

## 2023-12-09 LAB — RAPID STREP SCREEN (MED CTR MEBANE ONLY): Strep Gp A Ag, IA W/Reflex: NEGATIVE

## 2023-12-09 LAB — NOVEL CORONAVIRUS, NAA: SARS-CoV-2, NAA: NOT DETECTED

## 2023-12-09 LAB — VERITOR FLU A/B WAIVED
Influenza A: NEGATIVE
Influenza B: NEGATIVE

## 2023-12-09 LAB — CULTURE, GROUP A STREP

## 2023-12-09 MED ORDER — POTASSIUM CHLORIDE ER 10 MEQ PO TBCR: 10.0000 meq | EXTENDED_RELEASE_TABLET | Freq: Every day | ORAL | 3 refills | Status: DC

## 2023-12-09 MED ORDER — VALSARTAN 40 MG PO TABS: 40.0000 mg | ORAL_TABLET | Freq: Every day | ORAL | 3 refills | Status: DC

## 2023-12-09 MED ORDER — PRAVASTATIN SODIUM 20 MG PO TABS: 20.0000 mg | ORAL_TABLET | Freq: Every day | ORAL | 3 refills | Status: DC

## 2023-12-09 MED ORDER — APIXABAN 5 MG PO TABS: 5.0000 mg | ORAL_TABLET | Freq: Two times a day (BID) | ORAL | 3 refills | Status: DC

## 2023-12-09 MED ORDER — AMIODARONE HCL 200 MG PO TABS: 200.0000 mg | ORAL_TABLET | Freq: Every day | ORAL | 3 refills | Status: DC

## 2023-12-09 MED ORDER — TORSEMIDE 20 MG PO TABS: 20.0000 mg | ORAL_TABLET | Freq: Every day | ORAL | 3 refills | Status: DC

## 2023-12-09 MED ORDER — NITROGLYCERIN 0.4 MG SL SUBL: 0.4000 mg | SUBLINGUAL_TABLET | SUBLINGUAL | 3 refills | Status: AC | PRN

## 2023-12-09 MED ORDER — METOPROLOL SUCCINATE ER 50 MG PO TB24: 50.0000 mg | ORAL_TABLET | Freq: Every day | ORAL | 3 refills | Status: DC

## 2023-12-09 NOTE — Patient Instructions (Addendum)
Medication Instructions:  Please add potassium 10 meq once a day Please decrease the metoprolol down to 50 mg daily Please stop plavix/clopidogrel  If you need a refill on your cardiac medications before your next appointment, please call your pharmacy.   Lab work: No new labs needed  Testing/Procedures: Your physician has requested that you have an echocardiogram. Echocardiography is a painless test that uses sound waves to create images of your heart. It provides your doctor with information about the size and shape of your heart and how well your heart's chambers and valves are working.   You may receive an ultrasound enhancing agent through an IV if needed to better visualize your heart during the echo. This procedure takes approximately one hour.  There are no restrictions for this procedure.  This will take place at [address] [facility] [facility] [address], [place] [address]  Please note: We ask at that you not bring children with you during ultrasound (echo/ vascular) testing. Due to room size and safety concerns, children are not allowed in the ultrasound rooms during exams. Our front office staff cannot provide observation of children in our lobby area while testing is being conducted. An adult accompanying a patient to their appointment will only be allowed in the ultrasound room at the discretion of the ultrasound technician under special circumstances. We apologize for any inconvenience.   Follow-Up: At [facility], you and your health needs are our priority.  As part of our continuing mission to provide you with exceptional heart care, we have created designated Provider Care Teams.  These Care Teams include your primary Cardiologist (physician) and Advanced Practice Providers (APPs -  Physician Assistants and Nurse Practitioners) who all work together to provide you with the care you need, when you need it.  You will need a follow up appointment in 6  months  Providers on your designated Care Team:   Nicolasa Ducking, NP Eula Listen, PA-C Cadence Fransico Michael, [place]  COVID-19 Vaccine Information can be found at: [email or web address] For questions related to vaccine distribution or appointments, please email [email or web address] or call [phone].

## 2023-12-10 NOTE — Telephone Encounter (Signed)
Requested Prescriptions  Pending Prescriptions Disp Refills   glipiZIDE (GLUCOTROL XL) 5 MG 24 hr tablet [Pharmacy Med Name: GLIPIZIDE ER 5 MG TAB] 90 tablet 0    Sig: TAKE ONE TABLET (5 MG) BY MOUTH EVERY DAY     Endocrinology:  Diabetes - Sulfonylureas Passed - 12/10/2023  9:53 AM      Passed - HBA1C is between 0 and 7.9 and within 180 days    HB A1C (BAYER DCA - WAIVED)  Date Value Ref Range Status  09/03/2023 7.2 (H) 4.8 - 5.6 % Final    Comment:             Prediabetes: 5.7 - 6.4          Diabetes: >6.4          Glycemic control for adults with diabetes: <7.0          Passed - Cr in normal range and within 360 days    Creatinine, Ser  Date Value Ref Range Status  09/26/2023 0.82 0.57 - 1.00 mg/dL Final         Passed - Valid encounter within last 6 months    Recent Outpatient Visits           4 days ago Pneumonia of right lower lobe due to infectious organism   [place] [facility], Megan P, DO   2 months ago Polypharmacy   [place] [facility] Jackolyn Confer, MD   2 months ago Dizziness   [place] [facility] Jackolyn Confer, MD   3 months ago Dizziness   [place] [facility] Jackolyn Confer, MD   3 months ago Dysuria   [facility] Jackolyn Confer, MD       Future Appointments             In 1 week Evelene Croon, Atilano Median, MD [facility] [place] [facility]

## 2023-12-20 ENCOUNTER — Ambulatory Visit (INDEPENDENT_AMBULATORY_CARE_PROVIDER_SITE_OTHER): Payer: Medicare PPO

## 2023-12-20 ENCOUNTER — Ambulatory Visit: Payer: Medicare PPO | Admitting: Pediatrics

## 2023-12-20 DIAGNOSIS — M2142 Flat foot [pes planus] (acquired), left foot: Secondary | ICD-10-CM | POA: Diagnosis not present

## 2023-12-20 DIAGNOSIS — M2042 Other hammer toe(s) (acquired), left foot: Secondary | ICD-10-CM

## 2023-12-20 DIAGNOSIS — E1142 Type 2 diabetes mellitus with diabetic polyneuropathy: Secondary | ICD-10-CM | POA: Diagnosis not present

## 2023-12-20 DIAGNOSIS — M2041 Other hammer toe(s) (acquired), right foot: Secondary | ICD-10-CM

## 2023-12-20 DIAGNOSIS — M2141 Flat foot [pes planus] (acquired), right foot: Secondary | ICD-10-CM | POA: Diagnosis not present

## 2023-12-20 NOTE — Progress Notes (Signed)

## 2023-12-23 DIAGNOSIS — M25512 Pain in left shoulder: Secondary | ICD-10-CM | POA: Diagnosis not present

## 2023-12-23 DIAGNOSIS — M6281 Muscle weakness (generalized): Secondary | ICD-10-CM | POA: Diagnosis not present

## 2023-12-23 DIAGNOSIS — Z9181 History of falling: Secondary | ICD-10-CM | POA: Diagnosis not present

## 2023-12-23 DIAGNOSIS — R2681 Unsteadiness on feet: Secondary | ICD-10-CM | POA: Diagnosis not present

## 2023-12-23 DIAGNOSIS — R2689 Other abnormalities of gait and mobility: Secondary | ICD-10-CM | POA: Diagnosis not present

## 2023-12-24 DIAGNOSIS — R278 Other lack of coordination: Secondary | ICD-10-CM | POA: Diagnosis not present

## 2023-12-24 DIAGNOSIS — R41841 Cognitive communication deficit: Secondary | ICD-10-CM | POA: Diagnosis not present

## 2023-12-24 DIAGNOSIS — R4181 Age-related cognitive decline: Secondary | ICD-10-CM | POA: Diagnosis not present

## 2023-12-25 DIAGNOSIS — R2681 Unsteadiness on feet: Secondary | ICD-10-CM | POA: Diagnosis not present

## 2023-12-25 DIAGNOSIS — M6281 Muscle weakness (generalized): Secondary | ICD-10-CM | POA: Diagnosis not present

## 2023-12-25 DIAGNOSIS — R2689 Other abnormalities of gait and mobility: Secondary | ICD-10-CM | POA: Diagnosis not present

## 2023-12-25 DIAGNOSIS — Z9181 History of falling: Secondary | ICD-10-CM | POA: Diagnosis not present

## 2023-12-25 DIAGNOSIS — M25512 Pain in left shoulder: Secondary | ICD-10-CM | POA: Diagnosis not present

## 2023-12-26 DIAGNOSIS — R4181 Age-related cognitive decline: Secondary | ICD-10-CM | POA: Diagnosis not present

## 2023-12-26 DIAGNOSIS — R278 Other lack of coordination: Secondary | ICD-10-CM | POA: Diagnosis not present

## 2023-12-26 DIAGNOSIS — R41841 Cognitive communication deficit: Secondary | ICD-10-CM | POA: Diagnosis not present

## 2023-12-30 DIAGNOSIS — M25512 Pain in left shoulder: Secondary | ICD-10-CM | POA: Diagnosis not present

## 2023-12-30 DIAGNOSIS — R2681 Unsteadiness on feet: Secondary | ICD-10-CM | POA: Diagnosis not present

## 2023-12-30 DIAGNOSIS — R2689 Other abnormalities of gait and mobility: Secondary | ICD-10-CM | POA: Diagnosis not present

## 2023-12-30 DIAGNOSIS — Z9181 History of falling: Secondary | ICD-10-CM | POA: Diagnosis not present

## 2023-12-30 DIAGNOSIS — M6281 Muscle weakness (generalized): Secondary | ICD-10-CM | POA: Diagnosis not present

## 2023-12-31 ENCOUNTER — Other Ambulatory Visit: Payer: Self-pay | Admitting: Nurse Practitioner

## 2023-12-31 ENCOUNTER — Ambulatory Visit: Payer: Medicare PPO | Attending: Cardiovascular Disease

## 2023-12-31 DIAGNOSIS — R41841 Cognitive communication deficit: Secondary | ICD-10-CM | POA: Diagnosis not present

## 2023-12-31 DIAGNOSIS — I340 Nonrheumatic mitral (valve) insufficiency: Secondary | ICD-10-CM

## 2023-12-31 DIAGNOSIS — R278 Other lack of coordination: Secondary | ICD-10-CM | POA: Diagnosis not present

## 2023-12-31 DIAGNOSIS — I071 Rheumatic tricuspid insufficiency: Secondary | ICD-10-CM | POA: Diagnosis not present

## 2023-12-31 DIAGNOSIS — R4181 Age-related cognitive decline: Secondary | ICD-10-CM | POA: Diagnosis not present

## 2024-01-01 LAB — ECHOCARDIOGRAM COMPLETE
AR max vel: 2.14 cm2
AV Area VTI: 2.09 cm2
AV Area mean vel: 1.91 cm2
AV Mean grad: 2 mm[Hg]
AV Peak grad: 4 mm[Hg]
Ao pk vel: 1 m/s
Area-P 1/2: 3.85 cm2
Calc EF: 52.4 %
S' Lateral: 2.9 cm
Single Plane A2C EF: 54.2 %
Single Plane A4C EF: 51 %

## 2024-01-02 DIAGNOSIS — R4181 Age-related cognitive decline: Secondary | ICD-10-CM | POA: Diagnosis not present

## 2024-01-02 DIAGNOSIS — R41841 Cognitive communication deficit: Secondary | ICD-10-CM | POA: Diagnosis not present

## 2024-01-02 DIAGNOSIS — R278 Other lack of coordination: Secondary | ICD-10-CM | POA: Diagnosis not present

## 2024-01-02 NOTE — Telephone Encounter (Signed)
 Requested medication (s) are due for refill today: yes  Requested medication (s) are on the active medication list: yes  Last refill:  09/16/23 #90 0 refills  Future visit scheduled: no  Notes to clinic:  no refills remain. Do you want to refill Rx?     Requested Prescriptions  Pending Prescriptions Disp Refills   donepezil  (ARICEPT ) 10 MG tablet [Pharmacy Med Name: DONEPEZIL  HCL 10 MG TAB] 90 tablet 0    Sig: TAKE ONE TABLET (10 MG) BY MOUTH AT BEDTIME     Neurology:  Alzheimer's Agents Passed - 01/02/2024 12:17 PM      Passed - Valid encounter within last 6 months    Recent Outpatient Visits           3 weeks ago Pneumonia of right lower lobe due to infectious organism   [place] [facility] [place], Megan P, DO   2 months ago Polypharmacy   [place] [facility] Herold Hadassah SQUIBB, MD   3 months ago Dizziness   [place] [facility] Herold Hadassah SQUIBB, MD   3 months ago Dizziness   [place] [facility] Herold Hadassah SQUIBB, MD   4 months ago Dysuria    [place] [facility] Herold Hadassah SQUIBB, MD

## 2024-01-06 DIAGNOSIS — M6281 Muscle weakness (generalized): Secondary | ICD-10-CM | POA: Diagnosis not present

## 2024-01-06 DIAGNOSIS — R2681 Unsteadiness on feet: Secondary | ICD-10-CM | POA: Diagnosis not present

## 2024-01-06 DIAGNOSIS — R2689 Other abnormalities of gait and mobility: Secondary | ICD-10-CM | POA: Diagnosis not present

## 2024-01-06 DIAGNOSIS — Z9181 History of falling: Secondary | ICD-10-CM | POA: Diagnosis not present

## 2024-01-06 DIAGNOSIS — M25512 Pain in left shoulder: Secondary | ICD-10-CM | POA: Diagnosis not present

## 2024-01-07 ENCOUNTER — Encounter: Payer: Self-pay | Admitting: Emergency Medicine

## 2024-01-07 DIAGNOSIS — R41841 Cognitive communication deficit: Secondary | ICD-10-CM | POA: Diagnosis not present

## 2024-01-07 DIAGNOSIS — R278 Other lack of coordination: Secondary | ICD-10-CM | POA: Diagnosis not present

## 2024-01-07 DIAGNOSIS — R4181 Age-related cognitive decline: Secondary | ICD-10-CM | POA: Diagnosis not present

## 2024-01-08 DIAGNOSIS — M6281 Muscle weakness (generalized): Secondary | ICD-10-CM | POA: Diagnosis not present

## 2024-01-08 DIAGNOSIS — R2689 Other abnormalities of gait and mobility: Secondary | ICD-10-CM | POA: Diagnosis not present

## 2024-01-08 DIAGNOSIS — R2681 Unsteadiness on feet: Secondary | ICD-10-CM | POA: Diagnosis not present

## 2024-01-08 DIAGNOSIS — M25512 Pain in left shoulder: Secondary | ICD-10-CM | POA: Diagnosis not present

## 2024-01-08 DIAGNOSIS — Z9181 History of falling: Secondary | ICD-10-CM | POA: Diagnosis not present

## 2024-01-09 DIAGNOSIS — R278 Other lack of coordination: Secondary | ICD-10-CM | POA: Diagnosis not present

## 2024-01-09 DIAGNOSIS — R41841 Cognitive communication deficit: Secondary | ICD-10-CM | POA: Diagnosis not present

## 2024-01-09 DIAGNOSIS — R4181 Age-related cognitive decline: Secondary | ICD-10-CM | POA: Diagnosis not present

## 2024-01-13 DIAGNOSIS — M6281 Muscle weakness (generalized): Secondary | ICD-10-CM | POA: Diagnosis not present

## 2024-01-13 DIAGNOSIS — R2689 Other abnormalities of gait and mobility: Secondary | ICD-10-CM | POA: Diagnosis not present

## 2024-01-13 DIAGNOSIS — M25512 Pain in left shoulder: Secondary | ICD-10-CM | POA: Diagnosis not present

## 2024-01-13 DIAGNOSIS — R2681 Unsteadiness on feet: Secondary | ICD-10-CM | POA: Diagnosis not present

## 2024-01-13 DIAGNOSIS — Z9181 History of falling: Secondary | ICD-10-CM | POA: Diagnosis not present

## 2024-01-14 DIAGNOSIS — R278 Other lack of coordination: Secondary | ICD-10-CM | POA: Diagnosis not present

## 2024-01-14 DIAGNOSIS — R41841 Cognitive communication deficit: Secondary | ICD-10-CM | POA: Diagnosis not present

## 2024-01-14 DIAGNOSIS — R4181 Age-related cognitive decline: Secondary | ICD-10-CM | POA: Diagnosis not present

## 2024-01-16 DIAGNOSIS — R4181 Age-related cognitive decline: Secondary | ICD-10-CM | POA: Diagnosis not present

## 2024-01-16 DIAGNOSIS — R41841 Cognitive communication deficit: Secondary | ICD-10-CM | POA: Diagnosis not present

## 2024-01-16 DIAGNOSIS — R278 Other lack of coordination: Secondary | ICD-10-CM | POA: Diagnosis not present

## 2024-01-20 DIAGNOSIS — R2689 Other abnormalities of gait and mobility: Secondary | ICD-10-CM | POA: Diagnosis not present

## 2024-01-20 DIAGNOSIS — M6281 Muscle weakness (generalized): Secondary | ICD-10-CM | POA: Diagnosis not present

## 2024-01-20 DIAGNOSIS — R2681 Unsteadiness on feet: Secondary | ICD-10-CM | POA: Diagnosis not present

## 2024-01-20 DIAGNOSIS — M25512 Pain in left shoulder: Secondary | ICD-10-CM | POA: Diagnosis not present

## 2024-01-20 DIAGNOSIS — Z9181 History of falling: Secondary | ICD-10-CM | POA: Diagnosis not present

## 2024-01-21 DIAGNOSIS — R278 Other lack of coordination: Secondary | ICD-10-CM | POA: Diagnosis not present

## 2024-01-21 DIAGNOSIS — R41841 Cognitive communication deficit: Secondary | ICD-10-CM | POA: Diagnosis not present

## 2024-01-21 DIAGNOSIS — R4181 Age-related cognitive decline: Secondary | ICD-10-CM | POA: Diagnosis not present

## 2024-01-22 DIAGNOSIS — R2681 Unsteadiness on feet: Secondary | ICD-10-CM | POA: Diagnosis not present

## 2024-01-23 DIAGNOSIS — R41841 Cognitive communication deficit: Secondary | ICD-10-CM | POA: Diagnosis not present

## 2024-01-23 DIAGNOSIS — R4181 Age-related cognitive decline: Secondary | ICD-10-CM | POA: Diagnosis not present

## 2024-01-23 DIAGNOSIS — R278 Other lack of coordination: Secondary | ICD-10-CM | POA: Diagnosis not present

## 2024-01-27 DIAGNOSIS — R2681 Unsteadiness on feet: Secondary | ICD-10-CM | POA: Diagnosis not present

## 2024-01-28 DIAGNOSIS — R278 Other lack of coordination: Secondary | ICD-10-CM | POA: Diagnosis not present

## 2024-01-28 DIAGNOSIS — R4181 Age-related cognitive decline: Secondary | ICD-10-CM | POA: Diagnosis not present

## 2024-01-28 DIAGNOSIS — R41841 Cognitive communication deficit: Secondary | ICD-10-CM | POA: Diagnosis not present

## 2024-01-30 DIAGNOSIS — R4181 Age-related cognitive decline: Secondary | ICD-10-CM | POA: Diagnosis not present

## 2024-01-30 DIAGNOSIS — R278 Other lack of coordination: Secondary | ICD-10-CM | POA: Diagnosis not present

## 2024-01-30 DIAGNOSIS — R41841 Cognitive communication deficit: Secondary | ICD-10-CM | POA: Diagnosis not present

## 2024-02-03 DIAGNOSIS — R2681 Unsteadiness on feet: Secondary | ICD-10-CM | POA: Diagnosis not present

## 2024-02-04 ENCOUNTER — Telehealth: Payer: Self-pay | Admitting: Pediatrics

## 2024-02-04 DIAGNOSIS — R278 Other lack of coordination: Secondary | ICD-10-CM | POA: Diagnosis not present

## 2024-02-04 DIAGNOSIS — J4540 Moderate persistent asthma, uncomplicated: Secondary | ICD-10-CM

## 2024-02-04 DIAGNOSIS — R41841 Cognitive communication deficit: Secondary | ICD-10-CM | POA: Diagnosis not present

## 2024-02-04 DIAGNOSIS — R4181 Age-related cognitive decline: Secondary | ICD-10-CM | POA: Diagnosis not present

## 2024-02-04 NOTE — Telephone Encounter (Signed)
Pt called in states he insurance won't cover the albuterol under dme.

## 2024-02-05 ENCOUNTER — Other Ambulatory Visit: Payer: Self-pay | Admitting: Nurse Practitioner

## 2024-02-05 DIAGNOSIS — R2681 Unsteadiness on feet: Secondary | ICD-10-CM | POA: Diagnosis not present

## 2024-02-06 NOTE — Telephone Encounter (Signed)
Requested Prescriptions  Pending Prescriptions Disp Refills   venlafaxine XR (EFFEXOR-XR) 75 MG 24 hr capsule [Pharmacy Med Name: VENLAFAXINE HCL ER 75 MG CAP] 90 capsule 0    Sig: TAKE 1 CAPSULE BY MOUTH ONCE DAILY.     Psychiatry: Antidepressants - SNRI - desvenlafaxine & venlafaxine Failed - 02/06/2024 11:25 AM      Failed - Lipid Panel in normal range within the last 12 months    Cholesterol, Total  Date Value Ref Range Status  04/16/2023 211 (H) 100 - 199 mg/dL Final   LDL Chol Calc (NIH)  Date Value Ref Range Status  04/16/2023 110 (H) 0 - 99 mg/dL Final   HDL  Date Value Ref Range Status  04/16/2023 76 >39 mg/dL Final   Triglycerides  Date Value Ref Range Status  04/16/2023 145 0 - 149 mg/dL Final         Passed - Cr in normal range and within 360 days    Creatinine, Ser  Date Value Ref Range Status  09/26/2023 0.82 0.57 - 1.00 mg/dL Final         Passed - Completed PHQ-2 or PHQ-9 in the last 360 days      Passed - Last BP in normal range    BP Readings from Last 1 Encounters:  12/09/23 110/64         Passed - Valid encounter within last 6 months    Recent Outpatient Visits           2 months ago Pneumonia of right lower lobe due to infectious organism   [place] [facility] [place], [place], DO   3 months ago Polypharmacy   [place] [facility] Jackolyn Confer, MD   4 months ago Dizziness   [place] [facility] Jackolyn Confer, MD   4 months ago Dizziness   [place] [facility] Jackolyn Confer, MD   5 months ago Dysuria   [place] [facility] Jackolyn Confer, MD

## 2024-02-07 MED ORDER — ALBUTEROL SULFATE (2.5 MG/3ML) 0.083% IN NEBU: 2.5000 mg | INHALATION_SOLUTION | Freq: Four times a day (QID) | RESPIRATORY_TRACT | 3 refills | Status: DC | PRN

## 2024-02-07 NOTE — Telephone Encounter (Signed)
Patients insurance requires nebulizer solution under Part D benefits Sending to PCP to approve refill to pharmacy.

## 2024-02-10 DIAGNOSIS — R2681 Unsteadiness on feet: Secondary | ICD-10-CM | POA: Diagnosis not present

## 2024-02-11 DIAGNOSIS — R278 Other lack of coordination: Secondary | ICD-10-CM | POA: Diagnosis not present

## 2024-02-11 DIAGNOSIS — R4181 Age-related cognitive decline: Secondary | ICD-10-CM | POA: Diagnosis not present

## 2024-02-11 DIAGNOSIS — R41841 Cognitive communication deficit: Secondary | ICD-10-CM | POA: Diagnosis not present

## 2024-02-12 DIAGNOSIS — R2681 Unsteadiness on feet: Secondary | ICD-10-CM | POA: Diagnosis not present

## 2024-02-15 DIAGNOSIS — R278 Other lack of coordination: Secondary | ICD-10-CM | POA: Diagnosis not present

## 2024-02-15 DIAGNOSIS — R41841 Cognitive communication deficit: Secondary | ICD-10-CM | POA: Diagnosis not present

## 2024-02-15 DIAGNOSIS — R4181 Age-related cognitive decline: Secondary | ICD-10-CM | POA: Diagnosis not present

## 2024-02-17 DIAGNOSIS — R2681 Unsteadiness on feet: Secondary | ICD-10-CM | POA: Diagnosis not present

## 2024-02-18 DIAGNOSIS — R278 Other lack of coordination: Secondary | ICD-10-CM | POA: Diagnosis not present

## 2024-02-18 DIAGNOSIS — R4181 Age-related cognitive decline: Secondary | ICD-10-CM | POA: Diagnosis not present

## 2024-02-18 DIAGNOSIS — R41841 Cognitive communication deficit: Secondary | ICD-10-CM | POA: Diagnosis not present

## 2024-02-19 DIAGNOSIS — R2681 Unsteadiness on feet: Secondary | ICD-10-CM | POA: Diagnosis not present

## 2024-02-20 DIAGNOSIS — R41841 Cognitive communication deficit: Secondary | ICD-10-CM | POA: Diagnosis not present

## 2024-02-20 DIAGNOSIS — R4181 Age-related cognitive decline: Secondary | ICD-10-CM | POA: Diagnosis not present

## 2024-02-20 DIAGNOSIS — R278 Other lack of coordination: Secondary | ICD-10-CM | POA: Diagnosis not present

## 2024-03-06 ENCOUNTER — Other Ambulatory Visit: Payer: Self-pay | Admitting: Pediatrics

## 2024-03-06 NOTE — Telephone Encounter (Signed)
 Requested Prescriptions  Pending Prescriptions Disp Refills   glipiZIDE (GLUCOTROL XL) 5 MG 24 hr tablet [Pharmacy Med Name: GLIPIZIDE ER 5 MG TAB] 90 tablet 0    Sig: TAKE ONE TABLET (5 MG) BY MOUTH ONCE DAILY     Endocrinology:  Diabetes - Sulfonylureas Failed - 03/06/2024  5:44 PM      Failed - HBA1C is between 0 and 7.9 and within 180 days    HB A1C (BAYER DCA - WAIVED)  Date Value Ref Range Status  09/03/2023 7.2 (H) 4.8 - 5.6 % Final    Comment:             Prediabetes: 5.7 - 6.4          Diabetes: >6.4          Glycemic control for adults with diabetes: <7.0          Passed - Cr in normal range and within 360 days    Creatinine, Ser  Date Value Ref Range Status  09/26/2023 0.82 0.57 - 1.00 mg/dL Final         Passed - Valid encounter within last 6 months    Recent Outpatient Visits           3 months ago Pneumonia of right lower lobe due to infectious organism   [place] [facility] [employer], Megan P, DO   4 months ago Polypharmacy   [place] [facility] Jackolyn Confer, MD   5 months ago Dizziness   [place] [facility] Jackolyn Confer, MD   5 months ago Dizziness   [place] [facility] Jackolyn Confer, MD   6 months ago Dysuria   [place] [facility] Jackolyn Confer, MD

## 2024-03-16 ENCOUNTER — Other Ambulatory Visit: Payer: Self-pay | Admitting: Nurse Practitioner

## 2024-03-26 ENCOUNTER — Other Ambulatory Visit: Payer: Self-pay | Admitting: Emergency Medicine

## 2024-03-26 ENCOUNTER — Other Ambulatory Visit: Payer: Self-pay

## 2024-03-26 ENCOUNTER — Inpatient Hospital Stay
Admission: EM | Admit: 2024-03-26 | Discharge: 2024-03-30 | DRG: 871 | Disposition: A | Discharge status: Home Health | Source: Skilled Nursing Facility | Attending: Internal Medicine | Admitting: Internal Medicine

## 2024-03-26 ENCOUNTER — Encounter: Payer: Self-pay | Admitting: Family Medicine

## 2024-03-26 ENCOUNTER — Emergency Department

## 2024-03-26 ENCOUNTER — Ambulatory Visit: Admission: RE | Admit: 2024-03-26 | Source: Ambulatory Visit

## 2024-03-26 DIAGNOSIS — E785 Hyperlipidemia, unspecified: Secondary | ICD-10-CM | POA: Insufficient documentation

## 2024-03-26 DIAGNOSIS — Z66 Do not resuscitate: Secondary | ICD-10-CM | POA: Diagnosis present

## 2024-03-26 DIAGNOSIS — J189 Pneumonia, unspecified organism: Principal | ICD-10-CM | POA: Diagnosis present

## 2024-03-26 DIAGNOSIS — Z7901 Long term (current) use of anticoagulants: Secondary | ICD-10-CM

## 2024-03-26 DIAGNOSIS — D649 Anemia, unspecified: Secondary | ICD-10-CM | POA: Diagnosis present

## 2024-03-26 DIAGNOSIS — Z8249 Family history of ischemic heart disease and other diseases of the circulatory system: Secondary | ICD-10-CM | POA: Diagnosis not present

## 2024-03-26 DIAGNOSIS — F0283 Dementia in other diseases classified elsewhere, unspecified severity, with mood disturbance: Secondary | ICD-10-CM | POA: Diagnosis present

## 2024-03-26 DIAGNOSIS — Z1152 Encounter for screening for COVID-19: Secondary | ICD-10-CM | POA: Diagnosis not present

## 2024-03-26 DIAGNOSIS — R0602 Shortness of breath: Secondary | ICD-10-CM

## 2024-03-26 DIAGNOSIS — Z888 Allergy status to other drugs, medicaments and biological substances status: Secondary | ICD-10-CM

## 2024-03-26 DIAGNOSIS — J9601 Acute respiratory failure with hypoxia: Secondary | ICD-10-CM | POA: Diagnosis present

## 2024-03-26 DIAGNOSIS — K219 Gastro-esophageal reflux disease without esophagitis: Secondary | ICD-10-CM | POA: Diagnosis present

## 2024-03-26 DIAGNOSIS — G301 Alzheimer's disease with late onset: Secondary | ICD-10-CM | POA: Diagnosis present

## 2024-03-26 DIAGNOSIS — Z96652 Presence of left artificial knee joint: Secondary | ICD-10-CM | POA: Diagnosis present

## 2024-03-26 DIAGNOSIS — E1142 Type 2 diabetes mellitus with diabetic polyneuropathy: Secondary | ICD-10-CM | POA: Diagnosis present

## 2024-03-26 DIAGNOSIS — E1151 Type 2 diabetes mellitus with diabetic peripheral angiopathy without gangrene: Secondary | ICD-10-CM | POA: Diagnosis present

## 2024-03-26 DIAGNOSIS — I110 Hypertensive heart disease with heart failure: Secondary | ICD-10-CM | POA: Diagnosis present

## 2024-03-26 DIAGNOSIS — J45909 Unspecified asthma, uncomplicated: Secondary | ICD-10-CM | POA: Diagnosis present

## 2024-03-26 DIAGNOSIS — Z7952 Long term (current) use of systemic steroids: Secondary | ICD-10-CM

## 2024-03-26 DIAGNOSIS — Z833 Family history of diabetes mellitus: Secondary | ICD-10-CM

## 2024-03-26 DIAGNOSIS — Z7984 Long term (current) use of oral hypoglycemic drugs: Secondary | ICD-10-CM | POA: Diagnosis not present

## 2024-03-26 DIAGNOSIS — I509 Heart failure, unspecified: Secondary | ICD-10-CM | POA: Diagnosis present

## 2024-03-26 DIAGNOSIS — A419 Sepsis, unspecified organism: Principal | ICD-10-CM | POA: Diagnosis present

## 2024-03-26 DIAGNOSIS — I444 Left anterior fascicular block: Secondary | ICD-10-CM | POA: Diagnosis present

## 2024-03-26 DIAGNOSIS — E876 Hypokalemia: Secondary | ICD-10-CM | POA: Diagnosis present

## 2024-03-26 DIAGNOSIS — I480 Paroxysmal atrial fibrillation: Secondary | ICD-10-CM | POA: Diagnosis present

## 2024-03-26 DIAGNOSIS — Z85828 Personal history of other malignant neoplasm of skin: Secondary | ICD-10-CM

## 2024-03-26 DIAGNOSIS — E039 Hypothyroidism, unspecified: Secondary | ICD-10-CM | POA: Diagnosis present

## 2024-03-26 DIAGNOSIS — Z79899 Other long term (current) drug therapy: Secondary | ICD-10-CM

## 2024-03-26 DIAGNOSIS — Z79890 Hormone replacement therapy (postmenopausal): Secondary | ICD-10-CM

## 2024-03-26 DIAGNOSIS — I482 Chronic atrial fibrillation, unspecified: Secondary | ICD-10-CM

## 2024-03-26 DIAGNOSIS — I440 Atrioventricular block, first degree: Secondary | ICD-10-CM | POA: Diagnosis present

## 2024-03-26 DIAGNOSIS — F32A Depression, unspecified: Secondary | ICD-10-CM | POA: Diagnosis present

## 2024-03-26 DIAGNOSIS — F0393 Unspecified dementia, unspecified severity, with mood disturbance: Secondary | ICD-10-CM | POA: Diagnosis not present

## 2024-03-26 DIAGNOSIS — Z9841 Cataract extraction status, right eye: Secondary | ICD-10-CM

## 2024-03-26 DIAGNOSIS — Z90710 Acquired absence of both cervix and uterus: Secondary | ICD-10-CM

## 2024-03-26 DIAGNOSIS — I10 Essential (primary) hypertension: Secondary | ICD-10-CM | POA: Insufficient documentation

## 2024-03-26 DIAGNOSIS — G4730 Sleep apnea, unspecified: Secondary | ICD-10-CM | POA: Diagnosis present

## 2024-03-26 DIAGNOSIS — Z9842 Cataract extraction status, left eye: Secondary | ICD-10-CM

## 2024-03-26 LAB — RESPIRATORY PANEL BY PCR

## 2024-03-26 LAB — CBC
HCT: 34.4 % — ABNORMAL LOW (ref 36.0–46.0)
Hemoglobin: 11 g/dL — ABNORMAL LOW (ref 12.0–15.0)
MCH: 27.2 pg (ref 26.0–34.0)
MCHC: 32 g/dL (ref 30.0–36.0)
MCV: 85.1 fL (ref 80.0–100.0)
Platelets: 267 10*3/uL (ref 150–400)
RBC: 4.04 MIL/uL (ref 3.87–5.11)
RDW: 15.2 % (ref 11.5–15.5)
WBC: 14.7 10*3/uL — ABNORMAL HIGH (ref 4.0–10.5)
nRBC: 0 % (ref 0.0–0.2)

## 2024-03-26 LAB — CREATININE, SERUM
Creatinine, Ser: 0.67 mg/dL (ref 0.44–1.00)
GFR, Estimated: >60 mL/min (ref >60)

## 2024-03-26 LAB — CBG MONITORING, ED: Glucose-Capillary: 208 mg/dL — ABNORMAL HIGH (ref 70–99)

## 2024-03-26 MED ORDER — POTASSIUM CHLORIDE CRYS ER 10 MEQ PO TBCR
10.0000 meq | EXTENDED_RELEASE_TABLET | Freq: Every day | ORAL | Status: DC
  Administered 2024-03-26 – 2024-03-30 (×5): 10 meq via ORAL
  Filled 2024-03-26 (×5): qty 1

## 2024-03-26 MED ORDER — TORSEMIDE 20 MG PO TABS
20.0000 mg | ORAL_TABLET | Freq: Every day | ORAL | Status: DC
  Administered 2024-03-26 – 2024-03-30 (×5): 20 mg via ORAL
  Filled 2024-03-26 (×5): qty 1

## 2024-03-26 MED ORDER — AMIODARONE HCL 200 MG PO TABS
200.0000 mg | ORAL_TABLET | Freq: Every day | ORAL | Status: DC
  Administered 2024-03-26 – 2024-03-30 (×5): 200 mg via ORAL
  Filled 2024-03-26 (×5): qty 1

## 2024-03-26 MED ORDER — HYDROCOD POLI-CHLORPHE POLI ER 10-8 MG/5ML PO SUER
5.0000 mL | Freq: Two times a day (BID) | ORAL | Status: DC | PRN
  Administered 2024-03-26 – 2024-03-27 (×2): 5 mL via ORAL
  Filled 2024-03-26 (×2): qty 5

## 2024-03-26 MED ORDER — SODIUM CHLORIDE 0.9 % IV SOLN: 500.0000 mg | INTRAVENOUS | Status: DC

## 2024-03-26 MED ORDER — ACETAMINOPHEN 650 MG RE SUPP
650.0000 mg | Freq: Four times a day (QID) | RECTAL | Status: DC | PRN
Start: 2024-03-26 — End: 2024-03-30

## 2024-03-26 MED ORDER — ONDANSETRON HCL 4 MG/2ML IJ SOLN
4.0000 mg | Freq: Four times a day (QID) | INTRAMUSCULAR | Status: DC | PRN
  Administered 2024-03-26: 4 mg via INTRAVENOUS
  Filled 2024-03-26: qty 2

## 2024-03-26 MED ORDER — ONDANSETRON HCL 4 MG PO TABS: 4.0000 mg | ORAL_TABLET | Freq: Four times a day (QID) | ORAL | Status: DC | PRN

## 2024-03-26 MED ORDER — PRAVASTATIN SODIUM 20 MG PO TABS
20.0000 mg | ORAL_TABLET | Freq: Every day | ORAL | Status: DC
  Administered 2024-03-26 – 2024-03-29 (×4): 20 mg via ORAL
  Filled 2024-03-26 (×4): qty 1

## 2024-03-26 MED ORDER — NITROGLYCERIN 0.4 MG SL SUBL: 0.4000 mg | SUBLINGUAL_TABLET | SUBLINGUAL | Status: DC | PRN

## 2024-03-26 MED ORDER — MAGNESIUM HYDROXIDE 400 MG/5ML PO SUSP: 30.0000 mL | Freq: Every day | ORAL | Status: DC | PRN

## 2024-03-26 MED ORDER — DILTIAZEM HCL ER COATED BEADS 120 MG PO CP24
240.0000 mg | ORAL_CAPSULE | Freq: Every day | ORAL | Status: DC
  Administered 2024-03-26 – 2024-03-30 (×5): 240 mg via ORAL
  Filled 2024-03-26 (×2): qty 2
  Filled 2024-03-26 (×2): qty 1
  Filled 2024-03-26: qty 2

## 2024-03-26 MED ORDER — SODIUM CHLORIDE 0.9 % IV SOLN
100.0000 mg | Freq: Two times a day (BID) | INTRAVENOUS | Status: DC
  Administered 2024-03-27 – 2024-03-28 (×3): 100 mg via INTRAVENOUS
  Filled 2024-03-26 (×4): qty 100

## 2024-03-26 MED ORDER — SODIUM CHLORIDE 0.9 % IV SOLN
2.0000 g | Freq: Once | INTRAVENOUS | Status: AC
  Administered 2024-03-26: 2 g via INTRAVENOUS
  Filled 2024-03-26: qty 20

## 2024-03-26 MED ORDER — SODIUM CHLORIDE 0.9 % IV SOLN
500.0000 mg | Freq: Once | INTRAVENOUS | Status: AC
  Administered 2024-03-26: 500 mg via INTRAVENOUS
  Filled 2024-03-26: qty 5

## 2024-03-26 MED ORDER — ENOXAPARIN SODIUM 40 MG/0.4ML IJ SOSY
40.0000 mg | PREFILLED_SYRINGE | INTRAMUSCULAR | Status: DC
Start: 2024-03-26 — End: 2024-03-26

## 2024-03-26 MED ORDER — IPRATROPIUM-ALBUTEROL 0.5-2.5 (3) MG/3ML IN SOLN
3.0000 mL | Freq: Four times a day (QID) | RESPIRATORY_TRACT | Status: DC
  Administered 2024-03-26 – 2024-03-27 (×7): 3 mL via RESPIRATORY_TRACT
  Filled 2024-03-26 (×4): qty 3
  Filled 2024-03-26: qty 6
  Filled 2024-03-26: qty 3

## 2024-03-26 MED ORDER — ACETAMINOPHEN 325 MG PO TABS
650.0000 mg | ORAL_TABLET | Freq: Four times a day (QID) | ORAL | Status: DC | PRN
  Administered 2024-03-26 – 2024-03-27 (×3): 650 mg via ORAL
  Filled 2024-03-26 (×3): qty 2

## 2024-03-26 MED ORDER — DONEPEZIL HCL 5 MG PO TABS
10.0000 mg | ORAL_TABLET | Freq: Every day | ORAL | Status: DC
  Administered 2024-03-26 – 2024-03-29 (×4): 10 mg via ORAL
  Filled 2024-03-26 (×4): qty 2

## 2024-03-26 MED ORDER — METOPROLOL SUCCINATE ER 50 MG PO TB24
50.0000 mg | ORAL_TABLET | Freq: Every day | ORAL | Status: DC
  Administered 2024-03-26 – 2024-03-30 (×5): 50 mg via ORAL
  Filled 2024-03-26 (×5): qty 1

## 2024-03-26 MED ORDER — LEVOTHYROXINE SODIUM 88 MCG PO TABS
88.0000 ug | ORAL_TABLET | Freq: Every day | ORAL | Status: DC
  Filled 2024-03-26: qty 1

## 2024-03-26 MED ORDER — SODIUM CHLORIDE 0.9 % IV SOLN
2.0000 g | INTRAVENOUS | Status: AC
  Administered 2024-03-27 – 2024-03-30 (×4): 2 g via INTRAVENOUS
  Filled 2024-03-26 (×4): qty 20

## 2024-03-26 MED ORDER — GUAIFENESIN ER 600 MG PO TB12
600.0000 mg | ORAL_TABLET | Freq: Two times a day (BID) | ORAL | Status: DC
  Administered 2024-03-26 – 2024-03-30 (×9): 600 mg via ORAL
  Filled 2024-03-26 (×9): qty 1

## 2024-03-26 MED ORDER — IRBESARTAN 75 MG PO TABS
37.5000 mg | ORAL_TABLET | Freq: Every day | ORAL | Status: DC
  Administered 2024-03-26 – 2024-03-30 (×5): 37.5 mg via ORAL
  Filled 2024-03-26 (×5): qty 0.5

## 2024-03-26 MED ORDER — GABAPENTIN 100 MG PO CAPS
100.0000 mg | ORAL_CAPSULE | Freq: Every day | ORAL | Status: DC
Start: 2024-03-26 — End: 2024-03-30
  Administered 2024-03-26 – 2024-03-29 (×4): 100 mg via ORAL
  Filled 2024-03-26 (×4): qty 1

## 2024-03-26 MED ORDER — FUROSEMIDE 10 MG/ML IJ SOLN
40.0000 mg | Freq: Once | INTRAMUSCULAR | Status: AC
  Administered 2024-03-26: 40 mg via INTRAVENOUS
  Filled 2024-03-26: qty 4

## 2024-03-26 MED ORDER — LEVOTHYROXINE SODIUM 88 MCG PO TABS
88.0000 ug | ORAL_TABLET | Freq: Every day | ORAL | Status: DC
Start: 2024-03-26 — End: 2024-03-30
  Administered 2024-03-26 – 2024-03-30 (×5): 88 ug via ORAL
  Filled 2024-03-26 (×6): qty 1

## 2024-03-26 MED ORDER — LACTATED RINGERS IV SOLN
150.0000 mL/h | INTRAVENOUS | Status: AC
  Administered 2024-03-26: 150 mL/h via INTRAVENOUS

## 2024-03-26 MED ORDER — GLIPIZIDE ER 5 MG PO TB24
5.0000 mg | ORAL_TABLET | Freq: Every day | ORAL | Status: DC
  Administered 2024-03-26 – 2024-03-30 (×5): 5 mg via ORAL
  Filled 2024-03-26 (×5): qty 1

## 2024-03-26 MED ORDER — TRAZODONE HCL 50 MG PO TABS
25.0000 mg | ORAL_TABLET | Freq: Every evening | ORAL | Status: DC | PRN
  Administered 2024-03-26 – 2024-03-29 (×4): 25 mg via ORAL
  Filled 2024-03-26 (×4): qty 1

## 2024-03-26 MED ORDER — APIXABAN 5 MG PO TABS
5.0000 mg | ORAL_TABLET | Freq: Two times a day (BID) | ORAL | Status: DC
  Administered 2024-03-26 – 2024-03-30 (×9): 5 mg via ORAL
  Filled 2024-03-26 (×9): qty 1

## 2024-03-26 MED ORDER — ACETAMINOPHEN 500 MG PO TABS
1000.0000 mg | ORAL_TABLET | Freq: Once | ORAL | Status: AC
  Administered 2024-03-26: 1000 mg via ORAL
  Filled 2024-03-26: qty 2

## 2024-03-26 NOTE — Assessment & Plan Note (Signed)
-   We will continue antihypertensive therapy.

## 2024-03-26 NOTE — ED Notes (Signed)
 Pt soiled brief w/ urine. Pt cleaned and new brief placed. Bed linen changed.

## 2024-03-26 NOTE — Assessment & Plan Note (Signed)
 -  We will place the patient on supplemental coverage with NovoLog.

## 2024-03-26 NOTE — ED Notes (Signed)
 Called RT at this time to transition pt to high flow nasal canula. Pt stating in the upper 80's, currently at 87%. MD made aware.

## 2024-03-26 NOTE — Assessment & Plan Note (Signed)
-   The patient will be admitted to a medical telemetry bed. - Will continue antibiotic therapy with IV Rocephin and Zithromax. - Mucolytic therapy be provided as well as duo nebs q.i.d. and q.4 hours p.r.n. - We will follow blood cultures. - The patient will be hydrated with IV lactated ringer. - Sepsis manifested by leukocytosis and tachypnea.

## 2024-03-26 NOTE — H&P (Addendum)
  [place]   PATIENT NAME: Alison Richardson    MR#:  [id number]  DATE OF BIRTH:  12-14-1933  DATE OF ADMISSION:  03/26/2024  PRIMARY CARE PHYSICIAN: Jackolyn Confer, MD   Patient is coming from: Home  REQUESTING/REFERRING PHYSICIAN: Claudell Kyle, MD  CHIEF COMPLAINT:  No chief complaint on file.   HISTORY OF PRESENT ILLNESS:  Alison Richardson is a 89 y.o. female with medical history significant for paroxysmal atrial fibrillation/atrial flutter, CHF, type 2 diabetes mellitus, GERD, hypertension, peripheral vascular disease and dyslipidemia, who presented to the emergency room with acute onset of worsening dyspnea with associated dry cough and wheezing since yesterday.  The patient admitted to chills but denies any measured fever.  She has been having loose bowel movements with no nausea or vomiting.  No chest pain or palpitations.  No dysuria, oliguria or hematuria or flank pain.  She is not on home oxygen.  ED Course: When the patient came to the ER, BP was 167/83 and pulse oximetry was 90% on 3 L of O2 by nasal cannula and later 93% on 4 L.  Temperature is 99.5.  BMP revealed mild hypokalemia of 34 and blood glucose was 163.  CBC showed leukocytosis of 14.7 and anemia with hemoglobin 11 and hematocrit 34.4. EKG as reviewed by me : EKG showed sinus rhythm with rate of 83 with PACs.  It showed mildly prolonged PR interval and left intrafascicular block with low voltage QRS and poor R wave progression. Imaging: 2 view chest x-ray showed diffuse interstitial coarsening increased from 4/27 that may be related to edema or infection.  The patient was given IV Rocephin and Zithromax as well as 1 g of p.o. Tylenol.  She will be admitted to a medical telemetry bed for further evaluation and management. PAST MEDICAL HISTORY:   Past Medical History:  Diagnosis Date   Acute on chronic congestive heart failure (HCC) 10/07/2020   Arrhythmia    Atrial flutter (HCC) 01/2018   new onset    Basal  cell carcinoma of back    Basal cell carcinoma of lip    Cerebral aneurysm    followed by [facility]   CHF (congestive heart failure) (HCC)    DDD (degenerative disc disease), lumbar    superior plate depression, L3 08/18/2014   Diabetes mellitus type II, controlled (HCC)    Diverticulosis    Dysrhythmia    Paroxysmal Supraventricular Tachycardia   Dysthymia    depression   GERD (gastroesophageal reflux disease)    History of meniscal tear    Humeral surgical neck fracture 05/16/2022   Hyperlipidemia    Hypertension    Hypokalemia 12/12/2022   Hypothyroidism    Late onset Alzheimer's disease with behavioral disturbance (HCC)    Leukocytosis 10/07/2020   Mild pulmonary hypertension (HCC)    Multifocal pneumonia 12/09/2022   Overactive bladder    Peripheral vascular disease (HCC)    Puncture wound of forehead 06/11/2023   Seasonal allergic rhinitis    Severe sepsis (HCC) 02/07/2022   Sleep apnea     PAST SURGICAL HISTORY:   Past Surgical History:  Procedure Laterality Date   APPENDECTOMY  1946   BASAL CELL CARCINOMA EXCISION     2006 and 2009 removed from back and lip   CARDIOVASCULAR STRESS TEST  2015   nuclear cardiac stress test negative for ischemia - Dr. Tonita Phoenix   CATARACT EXTRACTION, BILATERAL  2006   COLONOSCOPY  2014   COLONOSCOPY N/A 08/19/2020  Procedure: COLONOSCOPY;  Surgeon: Regis Bill, MD;  Location: [facility] ENDOSCOPY;  Service: Endoscopy;  Laterality: N/A;   ECTOPIC PREGNANCY SURGERY  1957   ESOPHAGOGASTRODUODENOSCOPY N/A 08/19/2020   Procedure: ESOPHAGOGASTRODUODENOSCOPY (EGD);  Surgeon: Regis Bill, MD;  Location: [facility] ENDOSCOPY;  Service: Endoscopy;  Laterality: N/A;   INJECTION KNEE Left 05/2015   LEFT HEART CATH AND CORONARY ANGIOGRAPHY N/A 10/12/2021   Procedure: LEFT HEART CATH AND CORONARY ANGIOGRAPHY;  Surgeon: Lamar Blinks, MD;  Location: [facility] INVASIVE CV LAB;  Service: Cardiovascular;  Laterality: N/A;   REPLACEMENT TOTAL KNEE Left  09/06/2016   Dr. Suzette Battiest   TONSILLECTOMY AND ADENOIDECTOMY  1953   TOTAL ABDOMINAL HYSTERECTOMY  1986   [facility]    SOCIAL HISTORY:   Social History   Tobacco Use   Smoking status: Never   Smokeless tobacco: Never  Substance Use Topics   Alcohol use: Not Currently    FAMILY HISTORY:   Family History  Problem Relation Age of Onset   Hypertension Mother    Heart disease Father    CAD Father    Heart disease Sister    Diabetes Sister    Diabetes Paternal Uncle    Sleep apnea Son     DRUG ALLERGIES:   Allergies  Allergen Reactions   Pantoprazole Nausea And Vomiting   Metformin And Related Diarrhea    REVIEW OF SYSTEMS:   ROS As per history of present illness. All pertinent systems were reviewed above. Constitutional, HEENT, cardiovascular, respiratory, GI, GU, musculoskeletal, neuro, psychiatric, endocrine, integumentary and hematologic systems were reviewed and are otherwise negative/unremarkable except for positive findings mentioned above in the HPI.   MEDICATIONS AT HOME:   Prior to Admission medications   Medication Sig Start Date End Date Taking? Authorizing Provider  albuterol (PROVENTIL) (2.5 MG/3ML) 0.083% nebulizer solution Take 3 mLs (2.5 mg total) by nebulization every 6 (six) hours as needed for wheezing or shortness of breath. 02/07/24   Jackolyn Confer, MD  amiodarone (PACERONE) 200 MG tablet Take 1 tablet (200 mg total) by mouth daily. 12/09/23   Antonieta Iba, MD  amoxicillin-clavulanate (AUGMENTIN) 875-125 MG tablet Take 1 tablet by mouth 2 (two) times daily. 12/06/23   Johnson, Megan P, DO  apixaban (ELIQUIS) 5 MG TABS tablet Take 1 tablet (5 mg total) by mouth 2 (two) times daily. 12/09/23   Antonieta Iba, MD  diltiazem (CARDIZEM CD) 240 MG 24 hr capsule Take 240 mg by mouth daily.    [provider]  donepezil (ARICEPT) 10 MG tablet TAKE ONE TABLET (10 MG) BY MOUTH AT BEDTIME 01/02/24   Jackolyn Confer, MD  gabapentin (NEURONTIN) 100  MG capsule Take 1 capsule (100 mg total) by mouth at bedtime. 09/10/23   Jackolyn Confer, MD  glipiZIDE (GLUCOTROL XL) 5 MG 24 hr tablet TAKE ONE TABLET (5 MG) BY MOUTH ONCE DAILY 03/06/24   Jackolyn Confer, MD  glucose blood (ACCU-CHEK GUIDE) test strip USE TO TEST ONCE DAILY AS DIRECTED Patient not taking: Reported on 12/09/2023 09/12/23   Jackolyn Confer, MD  levothyroxine (SYNTHROID) 88 MCG tablet TAKE 1 TABLET EVERY DAY ON EMPTY STOMACHWITH A GLASS OF WATER AT LEAST 30-60 MINBEFORE BREAKFAST 10/31/23   Jackolyn Confer, MD  metoprolol succinate (TOPROL-XL) 50 MG 24 hr tablet Take 1 tablet (50 mg total) by mouth daily. Take with or immediately following a meal. 12/09/23   Gollan, Tollie Pizza, MD  nitroGLYCERIN (NITROSTAT) 0.4 MG SL tablet Place  1 tablet (0.4 mg total) under the tongue every 5 (five) minutes as needed for chest pain. 12/09/23   Antonieta Iba, MD  potassium chloride (KLOR-CON) 10 MEQ tablet Take 1 tablet (10 mEq total) by mouth daily. 12/09/23 03/08/24  Antonieta Iba, MD  pravastatin (PRAVACHOL) 20 MG tablet Take 1 tablet (20 mg total) by mouth at bedtime. 12/09/23   Antonieta Iba, MD  predniSONE (DELTASONE) 10 MG tablet 6 tabs in the am day 1, 5 tabs day 2, decrease by 1 every day until gone 12/06/23   Johnson, Megan P, DO  Tiotropium Bromide Monohydrate (SPIRIVA RESPIMAT) 1.25 MCG/ACT AERS Inhale into the lungs.    [provider]  torsemide (DEMADEX) 20 MG tablet Take 1 tablet (20 mg total) by mouth daily. 12/09/23   Antonieta Iba, MD  valsartan (DIOVAN) 40 MG tablet Take 1 tablet (40 mg total) by mouth daily. 12/09/23   Antonieta Iba, MD  venlafaxine XR (EFFEXOR-XR) 75 MG 24 hr capsule TAKE 1 CAPSULE BY MOUTH ONCE DAILY. 02/06/24   Jackolyn Confer, MD      VITAL SIGNS:  Blood pressure 137/75, pulse 87, temperature 99.5 F (37.5 C), temperature source Oral, resp. rate (!) 22, weight 78.4 kg, SpO2 91%.  PHYSICAL EXAMINATION:  Physical Exam  GENERAL:   89 y.o.-year-old Caucasian female patient lying in the bed with no acute distress.  EYES: Pupils equal, round, reactive to light and accommodation. No scleral icterus. Extraocular muscles intact.  HEENT: Head atraumatic, normocephalic. Oropharynx and nasopharynx clear.  NECK:  Supple, no jugular venous distention. No thyroid enlargement, no tenderness.  LUNGS: Diminished bibasal breath sounds with bibasal crackles.  No use of accessory muscles of respiration.  CARDIOVASCULAR: Regular rate and rhythm, S1, S2 normal. No murmurs, rubs, or gallops.  ABDOMEN: Soft, nondistended, nontender. Bowel sounds present. No organomegaly or mass.  EXTREMITIES: No pedal edema, cyanosis, or clubbing.  NEUROLOGIC: Cranial nerves II through XII are intact. Muscle strength 5/5 in all extremities. Sensation intact. Gait not checked.  PSYCHIATRIC: The patient is alert and oriented x 3.  Normal affect and good eye contact. SKIN: No obvious rash, lesion, or ulcer.   LABORATORY PANEL:   CBC Recent Labs  Lab 03/26/24 0540  WBC 14.7*  HGB 11.0*  HCT 34.4*  PLT 267   ------------------------------------------------------------------------------------------------------------------  Chemistries  Recent Labs  Lab 03/26/24 0540  CREATININE 0.67   ------------------------------------------------------------------------------------------------------------------  Cardiac Enzymes No results for input(s): "TROPONINI" in the last 168 hours. ------------------------------------------------------------------------------------------------------------------  RADIOLOGY:  DG Chest 2 View Result Date: 03/26/2024 CLINICAL DATA:  Shortness of breath and dry cough EXAM: CHEST - 2 VIEW COMPARISON:  04/20/2023 FINDINGS: Hyperinflation and chronic bronchitic change. Interstitial coarsening greatest in the lower lungs has increased compared to 04/20/2023. No pleural effusion or pneumothorax. Heart size upper limits of normal.  Aortic atherosclerotic calcification. IMPRESSION: Diffuse interstitial coarsening increased from 04/20/23 may be due to edema or infection. Electronically Signed   By: Minerva Fester M.D.   On: 03/26/2024 03:48      IMPRESSION AND PLAN:  Assessment and Plan: * Sepsis due to pneumonia [facility]) - The patient will be admitted to a medical telemetry bed. - Will continue antibiotic therapy with IV Rocephin and Zithromax. - Mucolytic therapy be provided as well as duo nebs q.i.d. and q.4 hours p.r.n. - We will follow blood cultures. - The patient will be hydrated with IV lactated ringer. - Sepsis manifested by leukocytosis and tachypnea.    Type 2  diabetes mellitus with peripheral neuropathy (HCC) - The patient will be placed on supplemental coverage with NovoLog. - We will continue Neurontin.  Paroxysmal atrial fibrillation (HCC) - She will continue amiodarone and ".  Hypothyroidism - We will continue Synthroid.  Dementia, senile with depression (HCC) - We will continue Aricept and Effexor XR.  Dyslipidemia - We will continue  statin therapy.  Essential hypertension - We will continue antihypertensive therapy.   DVT prophylaxis: Eliquis. Advanced Care Planning:  Code Status: The patient is DNR and DNI.  This was discussed with her. Family Communication:  The plan of care was discussed in details with the patient (and family). I answered all questions. The patient agreed to proceed with the above mentioned plan. Further management will depend upon hospital course. Disposition Plan: Back to previous home environment Consults called: none. All the records are reviewed and case discussed with ED provider.  Status is: Inpatient  At the time of the admission, it appears that the appropriate admission status for this patient is inpatient.  This is judged to be reasonable and necessary in order to provide the required intensity of service to ensure the patient's safety given the  presenting symptoms, physical exam findings and initial radiographic and laboratory data in the context of comorbid conditions.  The patient requires inpatient status due to high intensity of service, high risk of further deterioration and high frequency of surveillance required.  I certify that at the time of admission, it is my clinical judgment that the patient will require inpatient hospital care extending more than 2 midnights.                            Dispo: The patient is from: Home              Anticipated d/c is to: Home              Patient currently is not medically stable to d/c.              Difficult to place patient: No  Hannah Beat M.D on 03/26/2024 at 6:29 AM  [facility]   From 7 PM-7 AM, contact night-coverage [email or web address]  CC: Primary care physician; Jackolyn Confer, MD

## 2024-03-26 NOTE — Progress Notes (Signed)
 Progress Note   Patient: Alison Richardson RUE:[id number] DOB: 03-29-1933 DOA: 03/26/2024     0 DOS: the patient was seen and examined on 03/26/2024   Brief hospital course: Alison Richardson is a 89 y.o. female with medical history significant for paroxysmal atrial fibrillation/atrial flutter, CHF, type 2 diabetes mellitus, GERD, hypertension, peripheral vascular disease and dyslipidemia, who presented to the emergency room with acute onset of worsening dyspnea with associated dry cough and wheezing since yesterday.  The patient admitted to chills but denies any measured fever.  She has been having loose bowel movements with no nausea or vomiting.  No chest pain or palpitations.  No dysuria, oliguria or hematuria or flank pain.  She is not on home oxygen.  ED Course: When the patient came to the ER, BP was 167/83 and pulse oximetry was 90% on 3 L of O2 by nasal cannula and later 93% on 4 L.  Temperature is 99.5.  BMP revealed mild hypokalemia of 34 and blood glucose was 163.  CBC showed leukocytosis of 14.7 and anemia with hemoglobin 11 and hematocrit 34.4. EKG as reviewed by me : EKG showed sinus rhythm with rate of 83 with PACs.  It showed mildly prolonged PR interval and left intrafascicular block with low voltage QRS and poor R wave progression. Imaging: 2 view chest x-ray showed diffuse interstitial coarsening increased from 4/27 that may be related to edema or infection.   The patient was given IV Rocephin and Zithromax as well as 1 g of p.o. Tylenol.  She will be admitted to a medical telemetry bed for further    Assessment and Plan: Sepsis due to pneumonia [facility]) Acute hypoxic respiratory failure secondary to pneumonia Sepsis manifested by leukocytosis as well as tachypnea Chest x-ray showing diffuse infiltrates Continue telemetry Continue ceftriaxone and azithromycin Continue mucolytic therapy be provided as well as duo nebs q.i.d. and q.4 hours p.r.n. I have reviewed patient's echo report  obtained January 2025 that showed EF 60 to 65% with indeterminate diastolic parameters I have requested respiratory panel we will follow Continue to follow-up on culture results Patient currently requiring 9 L of intranasal oxygen We will continue to monitor saturations closely     Type 2 diabetes mellitus with peripheral neuropathy (HCC) Continue continue Neurontin. Continue insulin therapy Monitor glucose level   Paroxysmal atrial fibrillation (HCC) Continue amiodarone    Hypothyroidism Continue Synthroid.   Dementia, senile with depression (HCC) Continue Aricept and Effexor XR.   Dyslipidemia Continue statin therapy.   Essential hypertension - We will continue antihypertensive therapy.     DVT prophylaxis: Eliquis. Advanced Care Planning:  Code Status: The patient is DNR and DNI.  This was discussed with her.  Family Communication: I have discussed with patient's daughter over the phone  Disposition Plan: Back to previous home environment  Subjective:  Patient seen and examined at bedside this morning Having increased oxygen requirement up to 9 L Also having some cough but denies chest pain nausea vomiting or abdominal pain  Physical Exam:  GENERAL:  89 y.o.-year-old Caucasian female patient lying in the bed with no acute distress.  EYES: Pupils equal, round, reactive to light and accommodation. No scleral icterus. Extraocular muscles intact.  HEENT: Head atraumatic, normocephalic. Oropharynx and nasopharynx clear.  NECK:  Supple, no jugular venous distention. No thyroid enlargement, no tenderness.  LUNGS: Diminished air entry especially at the bases bilaterally CARDIOVASCULAR: Regular rate and rhythm, S1, S2 normal. No murmurs, rubs, or gallops.  ABDOMEN: Soft, nondistended, nontender. Bowel  sounds present. No organomegaly or mass.  EXTREMITIES: No pedal edema, cyanosis, or clubbing.  NEUROLOGIC: Moving all extremities alert and oriented x 3   Data  Reviewed:    Latest Ref Rng & Units 03/26/2024    5:40 AM 09/26/2023    2:57 PM 05/24/2023    9:10 PM  BMP  Glucose 70 - 99 mg/dL  161  096   BUN 8 - 27 mg/dL  13  13   Creatinine 0.45 - 1.00 mg/dL 4.09  8.11  9.14   BUN/Creat Ratio 12 - 28  16    Sodium 134 - 144 mmol/L  144  140   Potassium 3.5 - 5.2 mmol/L  3.4  3.2   Chloride 96 - 106 mmol/L  103  104   CO2 20 - 29 mmol/L  21  22   Calcium 8.7 - 10.3 mg/dL  9.0  8.9     Vitals:   03/26/24 1000 03/26/24 1100 03/26/24 1103 03/26/24 1200  BP: (!) 143/98 (!) 108/42    Pulse: 86 81    Resp: (!) 26 12    Temp:   98.5 F (36.9 C)   TempSrc:   Oral   SpO2: 99% 100%    Weight:      Height:    5\' 6"  (1.676 m)       Latest Ref Rng & Units 03/26/2024    5:40 AM 09/03/2023   12:03 PM 05/24/2023    9:10 PM  CBC  WBC 4.0 - 10.5 K/uL 14.7  CANCELED  9.2   Hemoglobin 12.0 - 15.0 g/dL 78.2  CANCELED  95.6   Hematocrit 36.0 - 46.0 % 34.4  CANCELED  42.4   Platelets 150 - 400 K/uL 267  CANCELED  392     {  Author: Loyce Dys, MD 03/26/2024 1:48 PM  For on call review [email or web address].

## 2024-03-26 NOTE — Assessment & Plan Note (Signed)
-   We will continue Aricept and Effexor XR.

## 2024-03-26 NOTE — Assessment & Plan Note (Signed)
 -  We will continue statin therapy.

## 2024-03-26 NOTE — Assessment & Plan Note (Signed)
-   She will continue amiodarone and ".

## 2024-03-26 NOTE — ED Notes (Signed)
Pt provided w/ sandwich per request.

## 2024-03-26 NOTE — Assessment & Plan Note (Signed)
-

## 2024-03-26 NOTE — Assessment & Plan Note (Signed)
 -  We will continue Synthroid.

## 2024-03-26 NOTE — ED Provider Notes (Addendum)
 [facility] Provider Note    Event Date/Time   First MD Initiated Contact with Patient 03/26/24 [phone]     (approximate)   History   No chief complaint on file.   HPI Alison Richardson is a 89 y.o. female with history of DM2, HTN, HLD, asthma, A-fib on Eliquis presenting today for shortness of breath.  Patient notes over the past several days she has had worsening shortness of breath associated with productive cough.  Intermittent chills.  Otherwise denies fever, chest pain, congestion, abdominal pain, nausea, vomiting, leg pain, leg swelling.  She took her albuterol inhaler tonight but did not notice significant benefit.  Was reportedly 90% on room air with EMS.     Physical Exam   Triage Vital Signs: ED Triage Vitals  Encounter Vitals Group     BP      Systolic BP Percentile      Diastolic BP Percentile      Pulse      Resp      Temp      Temp src      SpO2      Weight      Height      Head Circumference      Peak Flow      Pain Score      Pain Loc      Pain Education      Exclude from Growth Chart     Most recent vital signs: Vitals:   03/26/24 0359 03/26/24 0400  BP: (!) 167/83 137/75  Pulse: 88 87  Resp: 18 (!) 22  SpO2: 92% 91%    Physical Exam: I have reviewed the vital signs and nursing notes. General: Awake, alert, no acute distress.  Nontoxic appearing. Head:  Atraumatic, normocephalic.   ENT:  EOM intact, PERRL. Oral mucosa is pink and moist with no lesions. Neck: Neck is supple with full range of motion, No meningeal signs. Cardiovascular:  RRR, No murmurs. Peripheral pulses palpable and equal bilaterally. Respiratory:  Symmetrical chest wall expansion.  Mild tachypnea with no overt wheezing. Musculoskeletal:  No cyanosis or edema. Moving extremities with full ROM Abdomen:  Soft, nontender, nondistended. Neuro:  GCS 15, moving all four extremities, interacting appropriately. Speech clear. Psych:  Calm, appropriate.   Skin:   Warm, dry, no rash.    ED Results / Procedures / Treatments   Labs (all labs ordered are listed, but only abnormal results are displayed) Labs Reviewed  CBG MONITORING, ED - Abnormal; Notable for the following components:      Result Value   Glucose-Capillary 208 (*)    All other components within normal limits     EKG My EKG interpretation: Rate of 83, normal sinus rhythm, left axis deviation.  Left anterior fascicular block.  Normal intervals.  No acute ST elevations or depressions   RADIOLOGY Independently interpreted chest x-ray with evidence of bilateral infiltrates worse than prior baseline concerning for pneumonia   PROCEDURES:  Critical Care performed: Yes, see critical care procedure note(s)  .Critical Care  Performed by: Janith Lima, MD Authorized by: Janith Lima, MD   Critical care provider statement:    Critical care time (minutes):  30   Critical care was necessary to treat or prevent imminent or life-threatening deterioration of the following conditions:  Respiratory failure   Critical care was time spent personally by me on the following activities:  Development of treatment plan with patient or surrogate, discussions with consultants,  evaluation of patient's response to treatment, examination of patient, ordering and review of laboratory studies, ordering and review of radiographic studies, ordering and performing treatments and interventions, pulse oximetry, re-evaluation of patient's condition and review of old charts   I assumed direction of critical care for this patient from another provider in my specialty: no     Care discussed with: admitting provider      MEDICATIONS ORDERED IN ED: Medications  azithromycin (ZITHROMAX) 500 mg in sodium chloride 0.9 % 250 mL IVPB (500 mg Intravenous New Bag/Given 03/26/24 0435)  acetaminophen (TYLENOL) tablet 1,000 mg (1,000 mg Oral Given 03/26/24 0408)  cefTRIAXone (ROCEPHIN) 2 g in sodium chloride 0.9 % 100 mL  IVPB (0 g Intravenous Stopped 03/26/24 0434)     IMPRESSION / MDM / ASSESSMENT AND PLAN / ED COURSE  I reviewed the triage vital signs and the nursing notes.                              Differential diagnosis includes, but is not limited to, pneumonia, COVID, flu, RSV, pneumothorax, lower suspicion for CHF exacerbation  Patient's presentation is most consistent with acute presentation with potential threat to life or bodily function.  Patient is a 10-year-old female presenting today for shortness of breath and productive cough over the past 2 days.  On arrival she has mild tachypnea.  Ultimately found to be 85% on room air at rest and placed on 3 L to maintain sats in the mid 90s.  Patient with leukocytosis at 15.1 K.  CMP reassuring.  BNP only slightly elevated at 188 but no significant pitting edema to lower extremities suspect this is more likely chronic.  Not as elevated as prior labs have appeared.  Chest x-ray with evidence of worsening bilateral infiltrates concerning for infection in setting of leukocytosis, hypoxia, and productive cough.  Negative for COVID, flu, RSV.  Will admit to hospitalist for pneumonia with hypoxia on 2 L.  Given ceftriaxone and azithromycin.  The patient is on the cardiac monitor to evaluate for evidence of arrhythmia and/or significant heart rate changes. Clinical Course as of 03/26/24 0447  Thu Mar 26, 2024  0410 WBC of 15.1 K.  Chest x-ray with concerns for pneumonia.  Started on IV antibiotics.  CMP otherwise unremarkable.  First troponin negative. [DW]  0411 Hypoxic to 85% on room air and placed on 3 L [DW]  0444 Received fax labs during epic downtime.  Respiratory panel negative for COVID, flu, RSV.  BNP only slightly elevated at 188 although does not appear clinically volume overloaded with no significant pitting edema to lower extremities.  CMP unremarkable.  First troponin of 11 but no concerns for ACS.  Will admit for pneumonia with hypoxia. [DW]     Clinical Course User Index [DW] Janith Lima, MD     FINAL CLINICAL IMPRESSION(S) / ED DIAGNOSES   Final diagnoses:  Multifocal pneumonia  Acute hypoxic respiratory failure (HCC)     Rx / DC Orders   ED Discharge Orders     None        Note:  This document was prepared using Dragon voice recognition software and may include unintentional dictation errors.   Janith Lima, MD 03/26/24 Lavell Luster    Janith Lima, MD 03/26/24 [phone]

## 2024-03-26 NOTE — ED Notes (Signed)
 Pt repositioned in bed per request. Pt found to be 87% on 4L. Pt titrated to 6L humidified [place]ie

## 2024-03-27 DIAGNOSIS — A419 Sepsis, unspecified organism: Secondary | ICD-10-CM | POA: Diagnosis not present

## 2024-03-27 DIAGNOSIS — J189 Pneumonia, unspecified organism: Secondary | ICD-10-CM | POA: Diagnosis not present

## 2024-03-27 LAB — PROTIME-INR
INR: 1.5 — ABNORMAL HIGH (ref 0.8–1.2)
Prothrombin Time: 17.9 s — ABNORMAL HIGH (ref 11.4–15.2)

## 2024-03-27 LAB — BASIC METABOLIC PANEL WITH GFR
Anion gap: 10 (ref 5–15)
BUN: 10 mg/dL (ref 8–23)
CO2: 30 mmol/L (ref 22–32)
Calcium: 8.5 mg/dL — ABNORMAL LOW (ref 8.9–10.3)
Chloride: 102 mmol/L (ref 98–111)
Creatinine, Ser: 0.7 mg/dL (ref 0.44–1.00)
GFR, Estimated: >60 mL/min (ref >60)
Glucose, Bld: 101 mg/dL — ABNORMAL HIGH (ref 70–99)
Potassium: 2.9 mmol/L — ABNORMAL LOW (ref 3.5–5.1)
Sodium: 142 mmol/L (ref 135–145)

## 2024-03-27 LAB — CBC WITH DIFFERENTIAL/PLATELET
Abs Immature Granulocytes: 0.13 10*3/uL — ABNORMAL HIGH (ref 0.00–0.07)
Basophils Absolute: 0 10*3/uL (ref 0.0–0.1)
Basophils Relative: 0 %
Eosinophils Absolute: 0.2 10*3/uL (ref 0.0–0.5)
Eosinophils Relative: 2 %
HCT: 35.2 % — ABNORMAL LOW (ref 36.0–46.0)
Hemoglobin: 11.5 g/dL — ABNORMAL LOW (ref 12.0–15.0)
Immature Granulocytes: 1 %
Lymphocytes Relative: 22 %
Lymphs Abs: 2.3 10*3/uL (ref 0.7–4.0)
MCH: 27 pg (ref 26.0–34.0)
MCHC: 32.7 g/dL (ref 30.0–36.0)
MCV: 82.6 fL (ref 80.0–100.0)
Monocytes Absolute: 1 10*3/uL (ref 0.1–1.0)
Monocytes Relative: 10 %
Neutro Abs: 6.7 10*3/uL (ref 1.7–7.7)
Neutrophils Relative %: 65 %
Platelets: 255 10*3/uL (ref 150–400)
RBC: 4.26 MIL/uL (ref 3.87–5.11)
RDW: 15.4 % (ref 11.5–15.5)
WBC: 10.4 10*3/uL (ref 4.0–10.5)
nRBC: 0 % (ref 0.0–0.2)

## 2024-03-27 LAB — CORTISOL-AM, BLOOD: Cortisol - AM: 7 ug/dL (ref 6.7–22.6)

## 2024-03-27 MED ORDER — IPRATROPIUM-ALBUTEROL 0.5-2.5 (3) MG/3ML IN SOLN
3.0000 mL | Freq: Three times a day (TID) | RESPIRATORY_TRACT | Status: DC
  Administered 2024-03-28 – 2024-03-29 (×4): 3 mL via RESPIRATORY_TRACT
  Filled 2024-03-27 (×4): qty 3

## 2024-03-27 MED ORDER — POTASSIUM CHLORIDE CRYS ER 20 MEQ PO TBCR
40.0000 meq | EXTENDED_RELEASE_TABLET | ORAL | Status: AC
  Administered 2024-03-27 (×2): 40 meq via ORAL
  Filled 2024-03-27 (×2): qty 2

## 2024-03-27 NOTE — Progress Notes (Signed)
 Progress Note   Patient: Alison Richardson NGE:[id number] DOB: June 15, 1933 DOA: 03/26/2024     1 DOS: the patient was seen and examined on 03/27/2024   Brief hospital course: From HPI "Alison Richardson is a 89 y.o. female with medical history significant for paroxysmal atrial fibrillation/atrial flutter, CHF, type 2 diabetes mellitus, GERD, hypertension, peripheral vascular disease and dyslipidemia, who presented to the emergency room with acute onset of worsening dyspnea with associated dry cough and wheezing.   She is not on home oxygen.  Her oxygen requirement increased to 10 L.  Chest x-ray showed findings of pneumonia requiring IV antibiotic therapy and admission   Assessment and Plan: Sepsis due to pneumonia (HCC) Acute hypoxic respiratory failure secondary to pneumonia Sepsis manifested by leukocytosis as well as tachypnea Chest x-ray showing diffuse infiltrates Continue telemetry Continue ceftriaxone and azithromycin Continue mucolytic therapy be provided as well as duo nebs q.i.d. and q.4 hours p.r.n. I have reviewed patient's echo report obtained January 2025 that showed EF 60 to 65% with indeterminate diastolic parameters Viral respiratory panel is negative Continue to follow-up on culture results Continue incentive spirometer Patient currently requiring 9 L of intranasal oxygen We will continue to monitor saturations closely     Type 2 diabetes mellitus with peripheral neuropathy (HCC) Continue continue Neurontin. Continue insulin therapy Continue to monitor glucose levels   Paroxysmal atrial fibrillation (HCC) Continue amiodarone    Hypothyroidism Continue Synthroid.   Dementia, senile with depression (HCC) Continue Aricept and Effexor XR.   Dyslipidemia Continue statin therapy.   Essential hypertension - We will continue antihypertensive therapy.     DVT prophylaxis: Eliquis. Advanced Care Planning:  Code Status: The patient is DNR and DNI.  This was discussed with  her.   Family Communication: I have discussed with patient's daughter over the phone   Disposition Plan: Back to previous home environment   Subjective:  Patient seen and examined at bedside this morning. Still requiring 10 L of intranasal oxygen She tells me her respiratory function is improved compared to upon arrival According to nursing staff when patient sleeps.  Oxygen requirement increases.  She denies cough chest pain nausea or vomiting    Physical Exam:   GENERAL:  89 y.o.-year-old Caucasian female patient lying in the bed with no acute distress.  EYES: Pupils equal, round, reactive to light and accommodation. No scleral icterus. Extraocular muscles intact.  HEENT: Head atraumatic, normocephalic. Oropharynx and nasopharynx clear.  NECK:  Supple, no jugular venous distention. No thyroid enlargement, no tenderness.  LUNGS: Diminished air entry especially at the bases bilaterally CARDIOVASCULAR: Regular rate and rhythm, S1, S2 normal. No murmurs, rubs, or gallops.  ABDOMEN: Soft, nondistended, nontender. Bowel sounds present. No organomegaly or mass.  EXTREMITIES: No pedal edema, cyanosis, or clubbing.  NEUROLOGIC: Moving all extremities alert and oriented x 3     Data Reviewed:   Vitals:   03/27/24 1000 03/27/24 1030 03/27/24 1100 03/27/24 1157  BP: (!) 114/52 (!) 107/48 (!) 101/48   Pulse: 80 67 64   Resp: 15 18 (!) 21   Temp:    98.6 F (37 C)  TempSrc:    Oral  SpO2: 92% 98% 98%   Weight:      Height:          Latest Ref Rng & Units 03/27/2024    6:38 AM 03/26/2024    5:40 AM 09/03/2023   12:03 PM  CBC  WBC 4.0 - 10.5 K/uL 10.4  14.7  CANCELED  Hemoglobin 12.0 - 15.0 g/dL 04.5  40.9  CANCELED   Hematocrit 36.0 - 46.0 % 35.2  34.4  CANCELED   Platelets 150 - 400 K/uL 255  267  CANCELED        Latest Ref Rng & Units 03/27/2024    6:38 AM 03/26/2024    5:40 AM 09/26/2023    2:57 PM  BMP  Glucose 70 - 99 mg/dL 811   914   BUN 8 - 23 mg/dL 10   13   Creatinine  7.82 - 1.00 mg/dL 9.56  2.13  0.86   BUN/Creat Ratio 12 - 28   16   Sodium 135 - 145 mmol/L 142   144   Potassium 3.5 - 5.1 mmol/L 2.9   3.4   Chloride 98 - 111 mmol/L 102   103   CO2 22 - 32 mmol/L 30   21   Calcium 8.9 - 10.3 mg/dL 8.5   9.0      Author: Loyce Dys, MD 03/27/2024 1:26 PM  For on call review [email or web address].

## 2024-03-27 NOTE — ED Notes (Signed)
 CCMD notified of the patients planned move to RM 35.

## 2024-03-28 DIAGNOSIS — J189 Pneumonia, unspecified organism: Secondary | ICD-10-CM | POA: Diagnosis not present

## 2024-03-28 DIAGNOSIS — A419 Sepsis, unspecified organism: Secondary | ICD-10-CM | POA: Diagnosis not present

## 2024-03-28 LAB — CBC WITH DIFFERENTIAL/PLATELET
Abs Immature Granulocytes: 0.03 10*3/uL (ref 0.00–0.07)
Basophils Absolute: 0.1 10*3/uL (ref 0.0–0.1)
Basophils Relative: 1 %
Eosinophils Absolute: 0.3 10*3/uL (ref 0.0–0.5)
Eosinophils Relative: 3 %
HCT: 34.2 % — ABNORMAL LOW (ref 36.0–46.0)
Hemoglobin: 11 g/dL — ABNORMAL LOW (ref 12.0–15.0)
Immature Granulocytes: 0 %
Lymphocytes Relative: 26 %
Lymphs Abs: 2.6 10*3/uL (ref 0.7–4.0)
MCH: 27 pg (ref 26.0–34.0)
MCHC: 32.2 g/dL (ref 30.0–36.0)
MCV: 83.8 fL (ref 80.0–100.0)
Monocytes Absolute: 0.9 10*3/uL (ref 0.1–1.0)
Monocytes Relative: 9 %
Neutro Abs: 6.2 10*3/uL (ref 1.7–7.7)
Neutrophils Relative %: 61 %
Platelets: 276 10*3/uL (ref 150–400)
RBC: 4.08 MIL/uL (ref 3.87–5.11)
RDW: 15.5 % (ref 11.5–15.5)
WBC: 10.1 10*3/uL (ref 4.0–10.5)
nRBC: 0 % (ref 0.0–0.2)

## 2024-03-28 LAB — BASIC METABOLIC PANEL WITH GFR
Anion gap: 8 (ref 5–15)
BUN: 15 mg/dL (ref 8–23)
CO2: 28 mmol/L (ref 22–32)
Calcium: 9 mg/dL (ref 8.9–10.3)
Chloride: 106 mmol/L (ref 98–111)
Creatinine, Ser: 0.86 mg/dL (ref 0.44–1.00)
GFR, Estimated: >60 mL/min (ref >60)
Glucose, Bld: 135 mg/dL — ABNORMAL HIGH (ref 70–99)
Potassium: 4.4 mmol/L (ref 3.5–5.1)
Sodium: 142 mmol/L (ref 135–145)

## 2024-03-28 MED ORDER — DOXYCYCLINE HYCLATE 100 MG PO TABS
100.0000 mg | ORAL_TABLET | Freq: Two times a day (BID) | ORAL | Status: DC
  Administered 2024-03-28 – 2024-03-30 (×4): 100 mg via ORAL
  Filled 2024-03-28 (×4): qty 1

## 2024-03-28 NOTE — Plan of Care (Signed)
 Received report. Answers questions appropriately. Denies pain, nausea. Assessment completed. Using perwick to wall suction. Peri care done. Tolerating fluids/meal well. Telemetry in place. PIV to L FA, patent with good blood return. Upper lungs clear. Lower  lungs diminished. O2 on 2L [place]. HOB 30 degrees. Does not take a deep breaths. Snores when sleeping.  Able to self reposition.  1610 Reports has not had a bath since arrival Wed. Given bath, linen/gown changed. Reports feels better.  0600 Slept most of night

## 2024-03-28 NOTE — Progress Notes (Signed)
 Progress Note   Patient: Alison Richardson ZOX:[id number] DOB: February 04, 1933 DOA: 03/26/2024     2 DOS: the patient was seen and examined on 03/28/2024    Brief hospital course: From HPI "Alison Richardson is a 89 y.o. female with medical history significant for paroxysmal atrial fibrillation/atrial flutter, CHF, type 2 diabetes mellitus, GERD, hypertension, peripheral vascular disease and dyslipidemia, who presented to the emergency room with acute onset of worsening dyspnea with associated dry cough and wheezing.   She is not on home oxygen.  Her oxygen requirement increased to 10 L.  Chest x-ray showed findings of pneumonia requiring IV antibiotic therapy and admission   Assessment and Plan: Sepsis due to pneumonia (HCC) Acute hypoxic respiratory failure secondary to pneumonia Sepsis manifested by leukocytosis as well as tachypnea Chest x-ray showing diffuse infiltrates Continue telemetry Continue ceftriaxone and doxycycline Continue mucolytic therapy be provided as well as duo nebs q.i.d. and q.4 hours p.r.n. I have reviewed patient's echo report obtained January 2025 that showed EF 60 to 65% with indeterminate diastolic parameters Viral respiratory panel is negative Continue to follow-up on culture results Continue incentive spirometer Oxygen requirement has improved to 2.5 L today Patient does not use oxygen at home We will continue to monitor saturations closely     Type 2 diabetes mellitus with peripheral neuropathy (HCC) Continue continue Neurontin. Continue insulin therapy Continue to monitor glucose levels   Paroxysmal atrial fibrillation (HCC) Continue amiodarone    Hypothyroidism Continue Synthroid.   Dementia, senile with depression (HCC) Continue Aricept and Effexor XR.   Dyslipidemia Continue statin therapy.   Essential hypertension Continue antihypertensive therapy.     DVT prophylaxis: Eliquis. Advanced Care Planning:  Code Status: The patient is DNR and DNI.   This was discussed with her.   Family Communication: I have discussed with patient's daughter over the phone on 03/27/2024   Disposition Plan: Back to previous home environment   Subjective:  Patient seen and examined at bedside this morning. Patient's oxygen requirement has improved from 10 L yesterday.  2.5 L today She tells me that her respiratory function is improved She denies nausea vomiting abdominal pain She has a cough but is improving   Physical Exam:   GENERAL:  89 y.o.-year-old Caucasian female patient lying in the bed with no acute distress.  EYES: Pupils equal, round, reactive to light and accommodation. No scleral icterus. Extraocular muscles intact.  HEENT: Head atraumatic, normocephalic. Oropharynx and nasopharynx clear.  NECK:  Supple, no jugular venous distention. No thyroid enlargement, no tenderness.  LUNGS: Diminished air entry especially at the bases bilaterally CARDIOVASCULAR: Regular rate and rhythm, S1, S2 normal. No murmurs, rubs, or gallops.  ABDOMEN: Soft, nondistended, nontender. Bowel sounds present. No organomegaly or mass.  EXTREMITIES: No pedal edema, cyanosis, or clubbing.  NEUROLOGIC: Moving all extremities alert and oriented x 3  Disposition: Pending improvement in respiratory function as well as PT OT eval  Data Reviewed:  Vitals:   03/28/24 0416 03/28/24 0756 03/28/24 1307 03/28/24 1511  BP: 123/67 118/69  (!) 108/54  Pulse: 73 66  68  Resp: 20 18  18   Temp: 97.8 F (36.6 C) 97.7 F (36.5 C)  98 F (36.7 C)  TempSrc: Oral     SpO2: 92% 94% 95% 93%  Weight:      Height:          Latest Ref Rng & Units 03/28/2024    4:53 AM 03/27/2024    6:38 AM 03/26/2024    5:40 AM  CBC  WBC 4.0 - 10.5 K/uL 10.1  10.4  14.7   Hemoglobin 12.0 - 15.0 g/dL 16.1  09.6  04.5   Hematocrit 36.0 - 46.0 % 34.2  35.2  34.4   Platelets 150 - 400 K/uL 276  255  267        Latest Ref Rng & Units 03/28/2024    4:53 AM 03/27/2024    6:38 AM 03/26/2024    5:40 AM   BMP  Glucose 70 - 99 mg/dL 409  811    BUN 8 - 23 mg/dL 15  10    Creatinine 9.14 - 1.00 mg/dL 7.82  9.56  2.13   Sodium 135 - 145 mmol/L 142  142    Potassium 3.5 - 5.1 mmol/L 4.4  2.9    Chloride 98 - 111 mmol/L 106  102    CO2 22 - 32 mmol/L 28  30    Calcium 8.9 - 10.3 mg/dL 9.0  8.5       Author: Loyce Dys, MD 03/28/2024 4:06 PM  For on call review [email or web address].

## 2024-03-29 ENCOUNTER — Inpatient Hospital Stay

## 2024-03-29 DIAGNOSIS — J189 Pneumonia, unspecified organism: Secondary | ICD-10-CM | POA: Diagnosis not present

## 2024-03-29 DIAGNOSIS — A419 Sepsis, unspecified organism: Secondary | ICD-10-CM | POA: Diagnosis not present

## 2024-03-29 LAB — CBC WITH DIFFERENTIAL/PLATELET
Abs Immature Granulocytes: 0.04 10*3/uL (ref 0.00–0.07)
Basophils Absolute: 0.1 10*3/uL (ref 0.0–0.1)
Basophils Relative: 1 %
Eosinophils Absolute: 0.3 10*3/uL (ref 0.0–0.5)
Eosinophils Relative: 3 %
HCT: 35.5 % — ABNORMAL LOW (ref 36.0–46.0)
Hemoglobin: 11.3 g/dL — ABNORMAL LOW (ref 12.0–15.0)
Immature Granulocytes: 0 %
Lymphocytes Relative: 21 %
Lymphs Abs: 2.1 10*3/uL (ref 0.7–4.0)
MCH: 26.3 pg (ref 26.0–34.0)
MCHC: 31.8 g/dL (ref 30.0–36.0)
MCV: 82.8 fL (ref 80.0–100.0)
Monocytes Absolute: 1 10*3/uL (ref 0.1–1.0)
Monocytes Relative: 10 %
Neutro Abs: 6.5 10*3/uL (ref 1.7–7.7)
Neutrophils Relative %: 65 %
Platelets: 290 10*3/uL (ref 150–400)
RBC: 4.29 MIL/uL (ref 3.87–5.11)
RDW: 15.4 % (ref 11.5–15.5)
WBC: 9.9 10*3/uL (ref 4.0–10.5)
nRBC: 0 % (ref 0.0–0.2)

## 2024-03-29 LAB — BASIC METABOLIC PANEL WITH GFR
Anion gap: 9 (ref 5–15)
BUN: 17 mg/dL (ref 8–23)
CO2: 30 mmol/L (ref 22–32)
Calcium: 8.8 mg/dL — ABNORMAL LOW (ref 8.9–10.3)
Chloride: 101 mmol/L (ref 98–111)
Creatinine, Ser: 0.72 mg/dL (ref 0.44–1.00)
GFR, Estimated: >60 mL/min (ref >60)
Glucose, Bld: 118 mg/dL — ABNORMAL HIGH (ref 70–99)
Potassium: 3.7 mmol/L (ref 3.5–5.1)
Sodium: 140 mmol/L (ref 135–145)

## 2024-03-29 MED ORDER — METHYLPREDNISOLONE SODIUM SUCC 40 MG IJ SOLR
40.0000 mg | Freq: Every day | INTRAMUSCULAR | Status: DC
  Administered 2024-03-29: 40 mg via INTRAVENOUS
  Filled 2024-03-29: qty 1

## 2024-03-29 MED ORDER — IPRATROPIUM-ALBUTEROL 0.5-2.5 (3) MG/3ML IN SOLN
3.0000 mL | Freq: Two times a day (BID) | RESPIRATORY_TRACT | Status: DC
  Administered 2024-03-29: 3 mL via RESPIRATORY_TRACT
  Filled 2024-03-29: qty 3

## 2024-03-29 MED ORDER — BENZONATATE 100 MG PO CAPS
200.0000 mg | ORAL_CAPSULE | Freq: Three times a day (TID) | ORAL | Status: DC
  Administered 2024-03-29 – 2024-03-30 (×2): 200 mg via ORAL
  Filled 2024-03-29 (×3): qty 2

## 2024-03-29 NOTE — Progress Notes (Signed)
 PROGRESS NOTE    Alison Richardson  WUJ:[id number] DOB: August 13, 1933 DOA: 03/26/2024 PCP: Jackolyn Confer, MD    Brief Narrative:    From HPI "Alison Richardson is a 89 y.o. female with medical history significant for paroxysmal atrial fibrillation/atrial flutter, CHF, type 2 diabetes mellitus, GERD, hypertension, peripheral vascular disease and dyslipidemia, who presented to the emergency room with acute onset of worsening dyspnea with associated dry cough and wheezing.   She is not on home oxygen.  Her oxygen requirement increased to 10 L.  Chest x-ray showed findings of pneumonia requiring IV antibiotic therapy and admission   Assessment & Plan:   Principal Problem:   Sepsis due to pneumonia [facility]) Active Problems:   Type 2 diabetes mellitus with peripheral neuropathy (HCC)   Paroxysmal atrial fibrillation (HCC)   Hypothyroidism   Essential hypertension   Dyslipidemia   Dementia, senile with depression (HCC)   Sepsis due to pneumonia (HCC) Acute hypoxic respiratory failure secondary to pneumonia Sepsis manifested by leukocytosis as well as tachypnea Chest x-ray showing diffuse infiltrates Patient is clinically improving Respiratory viral panel negative COVID flu RSV negative Oxygen slowly improving Plan: Continue inpatient treatment with IV antibiotics for 1 additional day.  Continue as needed mucolytic's.  Can follow cultures, no growth to date.  Stress incentive spirometry use.  Wean oxygen as tolerated.  Mobilize as tolerated.  Anticipate discharge 4/7.     Type 2 diabetes mellitus with peripheral neuropathy (HCC) Adequate glycemic control over interval.  Will continue current insulin and Neurontin   Paroxysmal atrial fibrillation (HCC) Continue amiodarone    Hypothyroidism Continue Synthroid.   Dementia, senile with depression (HCC) Continue Aricept and Effexor XR.   Dyslipidemia Continue statin therapy.   Essential hypertension Continue antihypertensive therapy.      DVT prophylaxis: Eliquis Code Status: DNR Family Communication: None today Disposition Plan: Status is: Inpatient Remains inpatient appropriate because: CAP, respiratory failure   Level of care: Telemetry Medical  Consultants:  None  Procedures:  None  Antimicrobials: Ceftriaxone Doxycycline   Subjective: Seen and examined.  Sitting up in chair.  No visible distress.  Reports she is coughing but shortness of breath is improved.  Objective: Vitals:   03/28/24 2023 03/29/24 0431 03/29/24 0555 03/29/24 0837  BP: (!) 112/58 (!) 117/55  (!) 114/51  Pulse: 72 67  70  Resp: 16 16  18   Temp: 99.2 F (37.3 C) 99.4 F (37.4 C)  98.9 F (37.2 C)  TempSrc: Oral Oral    SpO2: 91% 93% 95% 92%  Weight:      Height:        Intake/Output Summary (Last 24 hours) at 03/29/2024 1154 Last data filed at 03/28/2024 2025 Gross per 24 hour  Intake 20 ml  Output 2550 ml  Net -2530 ml   Filed Weights   03/26/24 0539  Weight: 78.4 kg    Examination:  General exam: Appears calm and comfortable  Respiratory system: Bibasilar crackles.  Bibasilar end expiratory wheeze.  Normal work of breathing.  2 L Cardiovascular system: S2, RRR, no murmurs, no pedal edema Gastrointestinal system: Soft, NT/ND, normal bowel sounds Central nervous system: Alert and oriented. No focal neurological deficits. Extremities: Symmetric 5 x 5 power. Skin: No rashes, lesions or ulcers Psychiatry: Judgement and insight appear normal. Mood & affect appropriate.     Data Reviewed: I have personally reviewed following labs and imaging studies  CBC: Recent Labs  Lab 03/26/24 0540 03/27/24 0638 03/28/24 0453 03/29/24 0527  WBC 14.7*  10.4 10.1 9.9  NEUTROABS  --  6.7 6.2 6.5  HGB 11.0* 11.5* 11.0* 11.3*  HCT 34.4* 35.2* 34.2* 35.5*  MCV 85.1 82.6 83.8 82.8  PLT 267 255 276 290   Basic Metabolic Panel: Recent Labs  Lab 03/26/24 0540 03/27/24 0638 03/28/24 0453 03/29/24 0527  NA  --  142 142  140  K  --  2.9* 4.4 3.7  CL  --  102 106 101  CO2  --  30 28 30   GLUCOSE  --  101* 135* 118*  BUN  --  10 15 17   CREATININE 0.67 0.70 0.86 0.72  CALCIUM  --  8.5* 9.0 8.8*   GFR: Estimated Creatinine Clearance: 50.3 mL/min (by C-G formula based on SCr of 0.72 mg/dL). Liver Function Tests: No results for input(s): "AST", "ALT", "ALKPHOS", "BILITOT", "PROT", "ALBUMIN" in the last 168 hours. No results for input(s): "LIPASE", "AMYLASE" in the last 168 hours. No results for input(s): "AMMONIA" in the last 168 hours. Coagulation Profile: Recent Labs  Lab 03/27/24 0638  INR 1.5*   Cardiac Enzymes: No results for input(s): "CKTOTAL", "CKMB", "CKMBINDEX", "TROPONINI" in the last 168 hours. BNP (last 3 results) No results for input(s): "PROBNP" in the last 8760 hours. HbA1C: No results for input(s): "HGBA1C" in the last 72 hours. CBG: Recent Labs  Lab 03/26/24 0403  GLUCAP 208*   Lipid Profile: No results for input(s): "CHOL", "HDL", "LDLCALC", "TRIG", "CHOLHDL", "LDLDIRECT" in the last 72 hours. Thyroid Function Tests: No results for input(s): "TSH", "T4TOTAL", "FREET4", "T3FREE", "THYROIDAB" in the last 72 hours. Anemia Panel: No results for input(s): "VITAMINB12", "FOLATE", "FERRITIN", "TIBC", "IRON", "RETICCTPCT" in the last 72 hours. Sepsis Labs: No results for input(s): "PROCALCITON", "LATICACIDVEN" in the last 168 hours.  Recent Results (from the past 240 hours)  Respiratory (~20 pathogens) panel by PCR     Status: None   Collection Time: 03/26/24  1:51 PM   Specimen: Nasopharyngeal Swab; Respiratory  Result Value Ref Range Status   Adenovirus NOT DETECTED NOT DETECTED Final   Coronavirus 229E NOT DETECTED NOT DETECTED Final    Comment: (NOTE) The Coronavirus on the Respiratory Panel, DOES NOT test for the novel  Coronavirus (2019 nCoV)    Coronavirus HKU1 NOT DETECTED NOT DETECTED Final   Coronavirus NL63 NOT DETECTED NOT DETECTED Final   Coronavirus OC43 NOT  DETECTED NOT DETECTED Final   Metapneumovirus NOT DETECTED NOT DETECTED Final   Rhinovirus / Enterovirus NOT DETECTED NOT DETECTED Final   Influenza A NOT DETECTED NOT DETECTED Final   Influenza B NOT DETECTED NOT DETECTED Final   Parainfluenza Virus 1 NOT DETECTED NOT DETECTED Final   Parainfluenza Virus 2 NOT DETECTED NOT DETECTED Final   Parainfluenza Virus 3 NOT DETECTED NOT DETECTED Final   Parainfluenza Virus 4 NOT DETECTED NOT DETECTED Final   Respiratory Syncytial Virus NOT DETECTED NOT DETECTED Final   Bordetella pertussis NOT DETECTED NOT DETECTED Final   Bordetella Parapertussis NOT DETECTED NOT DETECTED Final   Chlamydophila pneumoniae NOT DETECTED NOT DETECTED Final   Mycoplasma pneumoniae NOT DETECTED NOT DETECTED Final    Comment: Performed at [facility], [address], [place], [place] [address]  Culture, blood (Routine X 2) w Reflex to ID Panel     Status: None (Preliminary result)   Collection Time: 03/26/24  4:42 PM   Specimen: BLOOD  Result Value Ref Range Status   Specimen Description BLOOD RIGHT ARM  Final   Special Requests   Final  BOTTLES DRAWN AEROBIC AND ANAEROBIC Blood Culture results may not be optimal due to an inadequate volume of blood received in culture bottles   Culture   Final    NO GROWTH 3 DAYS Performed at [facility], [address] [place], [place] [address]    Report Status PENDING  Incomplete  Culture, blood (Routine X 2) w Reflex to ID Panel     Status: None (Preliminary result)   Collection Time: 03/26/24  4:42 PM   Specimen: BLOOD  Result Value Ref Range Status   Specimen Description BLOOD LEFT ARM  Final   Special Requests   Final    BOTTLES DRAWN AEROBIC AND ANAEROBIC Blood Culture results may not be optimal due to an inadequate volume of blood received in culture bottles   Culture   Final    NO GROWTH 3 DAYS Performed at [facility], [address], [place], [place] [address]    Report Status  PENDING  Incomplete         Radiology Studies: No results found.      Scheduled Meds:  amiodarone  200 mg Oral Daily   apixaban  5 mg Oral BID   diltiazem  240 mg Oral Daily   donepezil  10 mg Oral QHS   doxycycline  100 mg Oral Q12H   gabapentin  100 mg Oral QHS   glipiZIDE  5 mg Oral Q breakfast   guaiFENesin  600 mg Oral BID   ipratropium-albuterol  3 mL Nebulization BID   irbesartan  37.5 mg Oral Daily   levothyroxine  88 mcg Oral Q0600   methylPREDNISolone (SOLU-MEDROL) injection  40 mg Intravenous Daily   metoprolol succinate  50 mg Oral Daily   potassium chloride  10 mEq Oral Daily   pravastatin  20 mg Oral QHS   torsemide  20 mg Oral Daily   Continuous Infusions:  cefTRIAXone (ROCEPHIN)  IV 2 g (03/29/24 0530)     LOS: 3 days      Tresa Moore, MD [facility]   If 7PM-7AM, please contact night-coverage  03/29/2024, 11:54 AM

## 2024-03-29 NOTE — Plan of Care (Signed)

## 2024-03-29 NOTE — TOC Initial Note (Signed)
 Transition of Care [facility]) - Initial/Assessment Note    Patient Details  Name: Alison Richardson MRN: [id number] Date of Birth: [birth year]  Transition of Care [facility]) CM/SW Contact:    Alison Cline, LCSW Phone Number: 03/29/2024, 4:53 PM  Clinical Narrative:                 CSW spoke with patient's daughter Alison Richardson. Patient lives at [facility]. She currently receives [facility] Speech Therapy there. Informed Alison Richardson of PT recommendation for HHPT as well. Alison Richardson states she and patient are agreeable - wants to use the agency that [employer] with, that they are currently using for Speech - will leave handoff for [facility] to call Cala Bradford at [place] tomorrow to confirm which agency this is so that it can be arranged. Alison Richardson states patient already has a rolling walker at home.  Expected Discharge Plan: Home w Home Health Services Barriers to Discharge: Continued Medical Work up   Patient Goals and CMS Choice   CMS Medicare.gov Compare Post Acute Care list provided to:: Patient Represenative (must comment) Choice offered to / list presented to : Adult Children      Expected Discharge Plan and Services       Living arrangements for the past 2 months: Independent Living Facility                                      Prior Living Arrangements/Services Living arrangements for the past 2 months: Independent Living Facility Lives with:: Self Patient language and need for interpreter reviewed:: Yes Do you feel safe going back to the place where you live?: Yes      Need for Family Participation in Patient Care: Yes (Comment) Care giver support system in place?: Yes (comment) Current home services: DME Criminal Activity/Legal Involvement Pertinent to Current Situation/Hospitalization: No - Comment as needed  Activities of Daily Living   ADL Screening (condition at time of admission) Independently performs ADLs?: Yes (appropriate for developmental  age) Is the patient deaf or have difficulty hearing?: No Does the patient have difficulty seeing, even when wearing glasses/contacts?: No Does the patient have difficulty concentrating, remembering, or making decisions?: No  Permission Sought/Granted Permission sought to share information with : Facility Industrial/product designer granted to share information with : Yes, Verbal Permission Granted     Permission granted to share info w AGENCY: [facility]        Emotional Assessment         Alcohol / Substance Use: Not Applicable Psych Involvement: No (comment)  Admission diagnosis:  SOB (shortness of breath) [R06.02] Sepsis due to pneumonia (HCC) [J18.9, A41.9] Multifocal pneumonia [J18.9] Acute hypoxic respiratory failure (HCC) [J96.01] Patient Active Problem List   Diagnosis Date Noted   Sepsis due to pneumonia (HCC) 03/26/2024   Paroxysmal atrial fibrillation (HCC) 03/26/2024   Essential hypertension 03/26/2024   Dyslipidemia 03/26/2024   Dementia, senile with depression (HCC) 03/26/2024   Near syncope 04/20/2023   Thoracic back pain 04/12/2023   Lumbar radiculopathy 02/06/2023   Moderate asthma without complication 12/12/2022   CAD (coronary artery disease) 12/09/2022   Atrial fibrillation, chronic (HCC) 05/16/2022   Use of cane as ambulatory aid 04/03/2022   History of falling 04/03/2022   History of non-ST elevation myocardial infarction (NSTEMI) 04/03/2022   History of cerebral aneurysm 04/03/2022   Gastroesophageal reflux disease 02/12/2022   DDD (degenerative  disc disease), lumbar    Chronic diastolic CHF (congestive heart failure) (HCC) 10/13/2020   Cor pulmonale, chronic (HCC) 09/27/2019   Late onset Alzheimer's disease without behavioral disturbance (HCC) 09/23/2019   Obstructive sleep apnea 07/09/2019   Type 2 diabetes mellitus with peripheral neuropathy (HCC) 01/21/2019   PSVT (paroxysmal supraventricular tachycardia) (HCC) 01/21/2019    PAD (peripheral artery disease) (HCC) 01/06/2019   Atrial fibrillation and flutter (HCC) 01/06/2019   Hyperlipidemia associated with type 2 diabetes mellitus (HCC) 09/08/2018   DM type 2 with diabetic mixed hyperlipidemia (HCC) 09/08/2018   Anxiety disorder 06/19/2018   Depression, major, single episode, mild (HCC) 06/19/2018   Vitamin B12 deficiency 06/19/2018   Insomnia 06/19/2018   Protein-calorie malnutrition (HCC) 06/19/2018   Hypertension associated with diabetes (HCC) 06/19/2018   Hypothyroidism 06/19/2018   Overactive bladder 06/19/2018   Primary osteoarthritis involving multiple joints 06/18/2018   PCP:  Jackolyn Confer, MD Pharmacy:   [facility] - [place], [place] - [address] [place] [address] Phone: [phone] Fax: [phone]     Social Drivers of Health (SDOH) Social History: SDOH Screenings   Food Insecurity: No Food Insecurity (03/26/2024)  Housing: Low Risk  (03/26/2024)  Transportation Needs: No Transportation Needs (03/26/2024)  Utilities: Not At Risk (03/26/2024)  Alcohol Screen: Low Risk  (04/09/2023)  Depression (PHQ2-9): Low Risk  (04/09/2023)  Recent Concern: Depression (PHQ2-9) - Medium Risk (01/15/2023)  Financial Resource Strain: Low Risk  (04/09/2023)  Physical Activity: Insufficiently Active (04/09/2023)  Social Connections: Socially Isolated (03/26/2024)  Stress: No Stress Concern Present (04/09/2023)  Tobacco Use: Low Risk  (03/26/2024)   SDOH Interventions:     Readmission Risk Interventions    03/29/2024    4:52 PM 01/26/2022    2:47 PM 11/02/2021   12:38 PM  Readmission Risk Prevention Plan  Transportation Screening Complete Complete Complete  PCP or Specialist Appt within 5-7 Days Complete    PCP or Specialist Appt within 3-5 Days  Complete Complete  Home Care Screening Complete    Medication Review (RN CM) Complete    HRI or Home Care Consult  Complete Complete  Social Work Consult for Recovery Care  Planning/Counseling  Complete Complete  Palliative Care Screening  Not Applicable Not Applicable  Medication Review Oceanographer)  Complete Complete

## 2024-03-29 NOTE — Evaluation (Signed)
 Physical Therapy Evaluation Patient Details Name: Alison Richardson MRN: [id number] DOB: 30-Sep-1933 Today's Date: 03/29/2024  History of Present Illness  Pt is an 89 y.o. female with medical history significant for paroxysmal atrial fibrillation/atrial flutter, CHF, type 2 diabetes mellitus, GERD, hypertension, peripheral vascular disease and dyslipidemia, who presented to the emergency room with acute onset of worsening dyspnea with associated dry cough and wheezing.   She is not on home oxygen.  Her oxygen requirement increased to 10 L. MD assessment includes: sepsis due to pneumonia and acute hypoxic respiratory failure.   Clinical Impression  Pt was pleasant and motivated to participate during the session and put forth good effort throughout. Pt required no physical assistance during the session and was generally steady with all standing activities.  Pt demonstrated good eccentric and concentric control and stability with sit to/from stand transfers and was able to amb 150 feet with no overt LOB.  Pt reported no adverse symptoms during the session with SpO2 and HR WNL throughout on room air.  Pt will benefit from continued PT services upon discharge to safely address deficits listed in patient problem list for decreased caregiver assistance and eventual return to PLOF.          If plan is discharge home, recommend the following: A little help with walking and/or transfers;Assist for transportation;Assistance with cooking/housework   Can travel by private vehicle        Equipment Recommendations Other (comment) (Pt stated will obtain RW herself)  Recommendations for Other Services       Functional Status Assessment Patient has had a recent decline in their functional status and demonstrates the ability to make significant improvements in function in a reasonable and predictable amount of time.     Precautions / Restrictions Precautions Precautions: Fall Restrictions Weight Bearing  Restrictions Per Provider Order: No      Mobility  Bed Mobility               General bed mobility comments: NT, pt in recliner    Transfers Overall transfer level: Needs assistance Equipment used: Rolling walker (2 wheels) Transfers: Sit to/from Stand Sit to Stand: Supervision           General transfer comment: Good eccentric and concentric control and stability    Ambulation/Gait Ambulation/Gait assistance: Supervision Gait Distance (Feet): 150 Feet Assistive device: Rolling walker (2 wheels) Gait Pattern/deviations: Step-through pattern, Decreased step length - right, Decreased step length - left Gait velocity: decreased     General Gait Details: Min reduced cadence but stead with no overt LOB including during turns and start/stops  Careers information officer     Tilt Bed    Modified Rankin (Stroke Patients Only)       Balance Overall balance assessment: Needs assistance Sitting-balance support: No upper extremity supported, Feet supported Sitting balance-Leahy Scale: Good     Standing balance support: During functional activity, Bilateral upper extremity supported Standing balance-Leahy Scale: Good                               Pertinent Vitals/Pain Pain Assessment Pain Assessment: No/denies pain    Home Living Family/patient expects to be discharged to:: Private residence Living Arrangements: Alone Available Help at Discharge: Other (Comment) Counselling psychologist at [employer] available to assist if needed) Type of Home: Independent living facility Home Access: Elevator  Home Layout: One level Home Equipment: Grab bars - tub/shower;Grab bars - toilet;Cane - single point;Shower seat;Rollator (4 wheels) Additional Comments: [facility] living section in one of their apartments    Prior Function Prior Level of Function : Independent/Modified Independent             Mobility Comments:  Mod Ind amb facility distances with a rollator, no fall history ADLs Comments: Patient reports she is I to mod I for ADLs. [employer] provides housecleaning. She has been working with home health speech therapy 2x a week on cognition to improve her medication management, name recall, and calander use.     Extremity/Trunk Assessment   Upper Extremity Assessment Upper Extremity Assessment: Overall WFL for tasks assessed    Lower Extremity Assessment Lower Extremity Assessment: Generalized weakness       Communication   Communication Communication: No apparent difficulties    Cognition Arousal: Alert Behavior During Therapy: WFL for tasks assessed/performed   PT - Cognitive impairments: No apparent impairments                         Following commands: Intact       Cueing Cueing Techniques: Verbal cues     General Comments      Exercises     Assessment/Plan    PT Assessment Patient needs continued PT services  PT Problem List Decreased strength;Decreased activity tolerance;Decreased balance       PT Treatment Interventions DME instruction;Gait training;Functional mobility training;Therapeutic activities;Therapeutic exercise;Balance training;Patient/family education    PT Goals (Current goals can be found in the Care Plan section)  Acute Rehab PT Goals Patient Stated Goal: To return home PT Goal Formulation: With patient Time For Goal Achievement: 04/11/24 Potential to Achieve Goals: Good    Frequency Min 2X/week     Co-evaluation               AM-PAC PT "6 Clicks" Mobility  Outcome Measure Help needed turning from your back to your side while in a flat bed without using bedrails?: None Help needed moving from lying on your back to sitting on the side of a flat bed without using bedrails?: None Help needed moving to and from a bed to a chair (including a wheelchair)?: A Little Help needed standing up from a chair using your arms (e.g.,  wheelchair or bedside chair)?: A Little Help needed to walk in hospital room?: A Little Help needed climbing 3-5 steps with a railing? : A Little 6 Click Score: 20    End of Session Equipment Utilized During Treatment: Gait belt Activity Tolerance: Patient tolerated treatment well Patient left: in chair;with call bell/phone within reach;with chair alarm set Nurse Communication: Mobility status PT Visit Diagnosis: Muscle weakness (generalized) (M62.81);Difficulty in walking, not elsewhere classified (R26.2)    Time: 1610-9604 PT Time Calculation (min) (ACUTE ONLY): 21 min   Charges:   PT Evaluation $PT Eval Moderate Complexity: 1 Mod   PT General Charges $$ ACUTE PT VISIT: 1 Visit    D. Scott Tyrae Alcoser PT, DPT 03/29/24, 2:25 PM

## 2024-03-29 NOTE — Progress Notes (Signed)
 Occupational Therapy Evaluation Patient Details Name: Alison Richardson MRN: [id number] DOB: 05-18-1933 Today's Date: 03/29/2024   History of Present Illness   Alison Richardson is a 89 y.o. female with medical history significant for paroxysmal atrial fibrillation/atrial flutter, CHF, type 2 diabetes mellitus, GERD, hypertension, peripheral vascular disease and dyslipidemia, who presented to the emergency room with acute onset of worsening dyspnea with associated dry cough and wheezing.   She is not on home oxygen.  Her oxygen requirement increased to 10 L.  Chest x-ray showed findings of pneumonia requiring IV antibiotic therapy and admission     Clinical Impressions Pt was seen for OT evaluation this date. Prior to hospital admission, pt was modi/indep amb with rollator. Pt lives at [facility] indepenedent living section. Pt presents to acute OT demonstrating impaired ADL performance and functional mobility 2/2 (See OT problem list for additional functional deficits). Bed mobility; MODI, no physical assistance required. Pt completed toleting in BR with CGA + RW throughout, utilized the railing to assist her, pt reports the seats are lower than she is used to. No physical assistance required to STS from toilet. Pt stood at sink to wash hand post toileting, CGA + RW. Pt on RA on arrival to room Sp02 levels 96%. Throughout mobility pt dstat to 81% with quick standing recovery back to 95% on RA. Pt would benefit from skilled OT services to address noted impairments and functional limitations (see below for any additional details) in order to maximize safety and independence while minimizing falls risk and caregiver burden. OT follow acutely.     If plan is discharge home, recommend the following:   A little help with walking and/or transfers;A little help with bathing/dressing/bathroom;Assistance with cooking/housework;Assist for transportation;Help with stairs or ramp for entrance      Functional Status Assessment   Patient has had a recent decline in their functional status and demonstrates the ability to make significant improvements in function in a reasonable and predictable amount of time.     Equipment Recommendations   None recommended by OT     Recommendations for Other Services         Precautions/Restrictions   Precautions Precautions: Fall     Mobility Bed Mobility Overal bed mobility: Modified Independent                  Transfers Overall transfer level: Needs assistance Equipment used: Rolling walker (2 wheels) Transfers: Sit to/from Stand, Bed to chair/wheelchair/BSC Sit to Stand: Contact guard assist           General transfer comment: Pt completed STS from various level surfaces with CGA, and use of hand rails + RW.      Balance Overall balance assessment: Needs assistance Sitting-balance support: No upper extremity supported, Feet supported Sitting balance-Leahy Scale: Good     Standing balance support: Single extremity supported, During functional activity, Reliant on assistive device for balance Standing balance-Leahy Scale: Good                             ADL either performed or assessed with clinical judgement   ADL Overall ADL's : Needs assistance/impaired Eating/Feeding: Independent   Grooming: Wash/dry hands;Standing                   Toilet Transfer: Ambulation;Rolling walker (2 wheels);Cueing for safety;Contact guard assist   Toileting- Clothing Manipulation and Hygiene: Modified independent;Sit to/from stand  Functional mobility during ADLs: Rolling walker (2 wheels);Contact guard assist General ADL Comments: Pt completed toleting in BR with CGA + RW throughout, utilized the railing to assist her, pt reports the seats are lower than she is used to. No physical assistance required to STS from toilet. Pt stood at sink to wash hand post toileting, CGA + RW.       Pertinent Vitals/Pain Pain Assessment Pain Assessment: No/denies pain     Extremity/Trunk Assessment Upper Extremity Assessment Upper Extremity Assessment: Overall WFL for tasks assessed   Lower Extremity Assessment Lower Extremity Assessment: Generalized weakness;Overall [facility] for tasks assessed;Defer to PT evaluation   Cervical / Trunk Assessment Cervical / Trunk Assessment: Normal   Communication Communication Communication: No apparent difficulties   Cognition Arousal: Alert Behavior During Therapy: WFL for tasks assessed/performed               OT - Cognition Comments: A/Ox4 HOH                 Following commands: Intact       Cueing  General Comments   Cueing Techniques: Verbal cues  Pt on RA on arrival to room Sp02 levels 96%. Throughout mobility pt dstat to 81% with quick standing recovery back to 95% on RA.   Exercises Exercises: Other exercises Other Exercises Other Exercises: Edu: Role of OT, d/c planning, ADL tolerance   Shoulder Instructions      Home Living Family/patient expects to be discharged to:: Assisted living                             Home Equipment: Grab bars - tub/shower;Grab bars - toilet;Cane - single point;Shower seat;Rollator (4 wheels)   Additional Comments: [facility] indepenedent living section      Prior Functioning/Environment Prior Level of Function : Independent/Modified Independent             Mobility Comments: Ambulates mod I with rollator. PT at [facility] ended 3 weeks prior. ADLs Comments: Patient reports she is I to mod I for ADLs. [facility] provides housecleaning. She has been working with home health speech therapy 2x a week on cognition to improve her medication management, name recall, and calander use.    OT Problem List: Decreased strength;Decreased activity tolerance;Impaired balance (sitting and/or standing);Decreased knowledge of use of DME or AE   OT  Treatment/Interventions: Self-care/ADL training;Therapeutic exercise;DME and/or AE instruction;Therapeutic activities      OT Goals(Current goals can be found in the care plan section)   Acute Rehab OT Goals Patient Stated Goal: return home OT Goal Formulation: With patient Time For Goal Achievement: 04/12/24 Potential to Achieve Goals: Good   OT Frequency:  Min 2X/week    Co-evaluation              AM-PAC OT "6 Clicks" Daily Activity     Outcome Measure Help from another person eating meals?: None Help from another person taking care of personal grooming?: A Little Help from another person toileting, which includes using toliet, bedpan, or urinal?: A Little Help from another person bathing (including washing, rinsing, drying)?: A Little Help from another person to put on and taking off regular upper body clothing?: None Help from another person to put on and taking off regular lower body clothing?: A Little 6 Click Score: 20   End of Session Equipment Utilized During Treatment: Gait belt;Rolling walker (2 wheels) Nurse Communication: Mobility status  Activity Tolerance:  Patient tolerated treatment well Patient left: in chair;with chair alarm set;with call bell/phone within reach  OT Visit Diagnosis: Unsteadiness on feet (R26.81);Other abnormalities of gait and mobility (R26.89);Muscle weakness (generalized) (M62.81)                Time: 0981-1914 OT Time Calculation (min): 29 min Charges:  OT General Charges $OT Visit: 1 Visit OT Evaluation $OT Eval Moderate Complexity: 1 Mod OT Treatments $Self Care/Home Management : 8-22 mins  Glenard Haring M.S. OTR/L  03/29/24, 1:07 PM

## 2024-03-30 ENCOUNTER — Other Ambulatory Visit: Payer: Self-pay

## 2024-03-30 DIAGNOSIS — J189 Pneumonia, unspecified organism: Secondary | ICD-10-CM | POA: Diagnosis not present

## 2024-03-30 DIAGNOSIS — J9601 Acute respiratory failure with hypoxia: Secondary | ICD-10-CM | POA: Diagnosis not present

## 2024-03-30 DIAGNOSIS — F0393 Unspecified dementia, unspecified severity, with mood disturbance: Secondary | ICD-10-CM | POA: Diagnosis not present

## 2024-03-30 DIAGNOSIS — A419 Sepsis, unspecified organism: Secondary | ICD-10-CM | POA: Diagnosis not present

## 2024-03-30 MED ORDER — IPRATROPIUM-ALBUTEROL 0.5-2.5 (3) MG/3ML IN SOLN: 3.0000 mL | Freq: Four times a day (QID) | RESPIRATORY_TRACT | Status: DC | PRN

## 2024-03-30 MED ORDER — DOXYCYCLINE HYCLATE 100 MG PO TABS
100.0000 mg | ORAL_TABLET | Freq: Two times a day (BID) | ORAL | 0 refills | Status: AC
  Filled 2024-03-30: qty 10, 5d supply, fill #0

## 2024-03-30 MED ORDER — BENZONATATE 200 MG PO CAPS
200.0000 mg | ORAL_CAPSULE | Freq: Three times a day (TID) | ORAL | 0 refills | Status: DC
  Filled 2024-03-30: qty 20, 7d supply, fill #0

## 2024-03-30 MED ORDER — PREDNISONE 10 MG PO TABS
ORAL_TABLET | ORAL | 0 refills | Status: AC
  Filled 2024-03-30: qty 21, 6d supply, fill #0

## 2024-03-30 MED ORDER — GUAIFENESIN ER 600 MG PO TB12
600.0000 mg | ORAL_TABLET | Freq: Two times a day (BID) | ORAL | 0 refills | Status: DC
  Filled 2024-03-30: qty 10, 5d supply, fill #0

## 2024-03-30 NOTE — Plan of Care (Signed)

## 2024-03-30 NOTE — Plan of Care (Signed)

## 2024-03-30 NOTE — Care Management Important Message (Signed)
 Important Message  Patient Details  Name: Alison Richardson MRN: [id number] Date of Birth: November 14, 1933   Important Message Given:  Yes - Medicare IM     Marcell Anger 03/30/2024, 10:59 AM

## 2024-03-30 NOTE — Discharge Summary (Signed)
 Physician Discharge Summary   Patient: Alison Richardson MRN: [id number] DOB: [birth year]  Admit date:     03/26/2024  Discharge date: 03/30/24  Discharge Physician: Delfino Lovett   PCP: Jackolyn Confer, MD   Recommendations at discharge:    F/up with outpt providers as requested  Discharge Diagnoses: Principal Problem:   Sepsis due to pneumonia [facility]) Active Problems:   Multifocal pneumonia   Type 2 diabetes mellitus with peripheral neuropathy (HCC)   Paroxysmal atrial fibrillation (HCC)   Hypothyroidism   Acute hypoxic respiratory failure (HCC)   Essential hypertension   Dyslipidemia   Dementia, senile with depression [facility])  Hospital Course: Assessment and Plan:  89 y.o. female with medical history significant for paroxysmal atrial fibrillation/atrial flutter, CHF, type 2 diabetes mellitus, GERD, hypertension, peripheral vascular disease and dyslipidemia, who presented to the emergency room with acute onset of worsening dyspnea with associated dry cough and wheezing.   She is not on home oxygen.  Her oxygen requirement increased to 10 L.  Chest x-ray showed findings of pneumonia requiring IV antibiotic therapy and admission    Sepsis due to pneumonia (HCC) Acute hypoxic respiratory failure secondary to pneumonia Sepsis manifested by leukocytosis as well as tachypnea Chest x-ray showing diffuse infiltrates Patient is clinically improving with treatment and is at baseline now Respiratory viral panel negative COVID flu RSV negative On room air now    Type 2 diabetes mellitus with peripheral neuropathy (HCC) Paroxysmal atrial fibrillation (HCC) Hypothyroidism Continue Synthroid.   Dementia, senile with depression (HCC) Continue Aricept and Effexor XR.   Dyslipidemia Continue statin therapy.   Essential hypertension Continue antihypertensive therapy.          Disposition: Home Diet recommendation:  Discharge Diet Orders (From admission, onward)     Start     Ordered    03/30/24 0000  Diet - low sodium heart healthy        03/30/24 0849           Carb modified diet DISCHARGE MEDICATION: Allergies as of 03/30/2024       Reactions   Pantoprazole Nausea And Vomiting   Metformin And Related Diarrhea        Medication List     STOP taking these medications    Accu-Chek Guide test strip Generic drug: glucose blood   amoxicillin-clavulanate 875-125 MG tablet Commonly known as: AUGMENTIN   potassium chloride 10 MEQ tablet Commonly known as: KLOR-CON       TAKE these medications    albuterol (2.5 MG/3ML) 0.083% nebulizer solution Commonly known as: PROVENTIL Take 3 mLs (2.5 mg total) by nebulization every 6 (six) hours as needed for wheezing or shortness of breath.   amiodarone 200 MG tablet Commonly known as: PACERONE Take 1 tablet (200 mg total) by mouth daily.   apixaban 5 MG Tabs tablet Commonly known as: ELIQUIS Take 1 tablet (5 mg total) by mouth 2 (two) times daily.   benzonatate 200 MG capsule Commonly known as: TESSALON Take 1 capsule (200 mg total) by mouth 3 (three) times daily.   diltiazem 240 MG 24 hr capsule Commonly known as: CARDIZEM CD Take 240 mg by mouth daily.   donepezil 10 MG tablet Commonly known as: ARICEPT TAKE ONE TABLET (10 MG) BY MOUTH AT BEDTIME What changed: See the new instructions.   doxycycline 100 MG tablet Commonly known as: VIBRA-TABS Take 1 tablet (100 mg total) by mouth every 12 (twelve) hours for 5 days.   gabapentin 100 MG capsule Commonly  known as: NEURONTIN Take 1 capsule (100 mg total) by mouth at bedtime.   glipiZIDE 5 MG 24 hr tablet Commonly known as: GLUCOTROL XL TAKE ONE TABLET (5 MG) BY MOUTH ONCE DAILY What changed: See the new instructions.   guaiFENesin 600 MG 12 hr tablet Commonly known as: MUCINEX Take 1 tablet (600 mg total) by mouth 2 (two) times daily for 5 days.   levothyroxine 88 MCG tablet Commonly known as: SYNTHROID TAKE 1 TABLET EVERY DAY ON EMPTY  STOMACHWITH A GLASS OF WATER AT LEAST 30-60 MINBEFORE BREAKFAST What changed: See the new instructions.   metoprolol succinate 50 MG 24 hr tablet Commonly known as: TOPROL-XL Take 1 tablet (50 mg total) by mouth daily. Take with or immediately following a meal.   nitroGLYCERIN 0.4 MG SL tablet Commonly known as: NITROSTAT Place 1 tablet (0.4 mg total) under the tongue every 5 (five) minutes as needed for chest pain.   pravastatin 20 MG tablet Commonly known as: PRAVACHOL Take 1 tablet (20 mg total) by mouth at bedtime.   predniSONE 10 MG tablet Commonly known as: DELTASONE Take 6 tablets (60 mg total) by mouth daily for 1 day, THEN 5 tablets (50 mg total) daily for 1 day, THEN 4 tablets (40 mg total) daily for 1 day, THEN 3 tablets (30 mg total) daily for 1 day, THEN 2 tablets (20 mg total) daily for 1 day, THEN 1 tablet (10 mg total) daily for 1 day. Start taking on: March 30, 2024 What changed: See the new instructions.   Spiriva Respimat 1.25 MCG/ACT Aers Generic drug: Tiotropium Bromide Monohydrate Inhale into the lungs.   torsemide 20 MG tablet Commonly known as: DEMADEX Take 1 tablet (20 mg total) by mouth daily.   valsartan 40 MG tablet Commonly known as: DIOVAN Take 1 tablet (40 mg total) by mouth daily.   venlafaxine XR 75 MG 24 hr capsule Commonly known as: EFFEXOR-XR TAKE 1 CAPSULE BY MOUTH ONCE DAILY.        Follow-up Information     Jackolyn Confer, MD. Go on 04/03/2024.   Specialty: Family Medicine Why: [facility] Discharge F/UP. Go @ 8:20am. Contact information: [address] [place] [address] [phone]                Discharge Exam: Ceasar Mons Weights   03/26/24 0539  Weight: 78.4 kg   General exam: Appears calm and comfortable  Respiratory system: CTA b/l Cardiovascular system: S2, RRR, no murmurs, no pedal edema Gastrointestinal system: Soft, NT/ND, normal bowel sounds Central nervous system: Alert and oriented. No focal neurological  deficits. Extremities: Symmetric 5 x 5 power. Skin: No rashes, lesions or ulcers Psychiatry: Judgement and insight appear normal. Mood & affect appropriate.   Condition at discharge: fair  The results of significant diagnostics from this hospitalization (including imaging, microbiology, ancillary and laboratory) are listed below for reference.   Imaging Studies: DG Foot Complete Right Result Date: 03/29/2024 CLINICAL DATA:  Pain. EXAM: RIGHT FOOT COMPLETE - 3+ VIEW COMPARISON:  None Available. FINDINGS: There is no evidence of acute fracture or dislocation. Mild degenerative changes of the first MTP joint. Diffuse interphalangeal joint space narrowing. Calcaneal enthesopathy at the insertion of the Achilles tendon and the origin of the central cord of the plantar fascia. Vascular calcifications are present. No significant focal soft tissue swelling identified. IMPRESSION: 1. No acute osseous abnormality. 2. Mild degenerative changes of the first MTP joint. 3. Calcaneal enthesopathy. Electronically Signed   By: Criss Rosales  Lateef M.D.   On: 03/29/2024 12:15   DG Chest 2 View Result Date: 03/26/2024 CLINICAL DATA:  Shortness of breath and dry cough EXAM: CHEST - 2 VIEW COMPARISON:  04/20/2023 FINDINGS: Hyperinflation and chronic bronchitic change. Interstitial coarsening greatest in the lower lungs has increased compared to 04/20/2023. No pleural effusion or pneumothorax. Heart size upper limits of normal. Aortic atherosclerotic calcification. IMPRESSION: Diffuse interstitial coarsening increased from 04/20/23 may be due to edema or infection. Electronically Signed   By: Minerva Fester M.D.   On: 03/26/2024 03:48    Microbiology: Results for orders placed or performed during the hospital encounter of 03/26/24  Respiratory (~20 pathogens) panel by PCR     Status: None   Collection Time: 03/26/24  1:51 PM   Specimen: Nasopharyngeal Swab; Respiratory  Result Value Ref Range Status   Adenovirus NOT  DETECTED NOT DETECTED Final   Coronavirus 229E NOT DETECTED NOT DETECTED Final    Comment: (NOTE) The Coronavirus on the Respiratory Panel, DOES NOT test for the novel  Coronavirus (2019 nCoV)    Coronavirus HKU1 NOT DETECTED NOT DETECTED Final   Coronavirus NL63 NOT DETECTED NOT DETECTED Final   Coronavirus OC43 NOT DETECTED NOT DETECTED Final   Metapneumovirus NOT DETECTED NOT DETECTED Final   Rhinovirus / Enterovirus NOT DETECTED NOT DETECTED Final   Influenza A NOT DETECTED NOT DETECTED Final   Influenza B NOT DETECTED NOT DETECTED Final   Parainfluenza Virus 1 NOT DETECTED NOT DETECTED Final   Parainfluenza Virus 2 NOT DETECTED NOT DETECTED Final   Parainfluenza Virus 3 NOT DETECTED NOT DETECTED Final   Parainfluenza Virus 4 NOT DETECTED NOT DETECTED Final   Respiratory Syncytial Virus NOT DETECTED NOT DETECTED Final   Bordetella pertussis NOT DETECTED NOT DETECTED Final   Bordetella Parapertussis NOT DETECTED NOT DETECTED Final   Chlamydophila pneumoniae NOT DETECTED NOT DETECTED Final   Mycoplasma pneumoniae NOT DETECTED NOT DETECTED Final    Comment: Performed at [facility], [address], [place], [place] [address]  Culture, blood (Routine X 2) w Reflex to ID Panel     Status: None (Preliminary result)   Collection Time: 03/26/24  4:42 PM   Specimen: BLOOD  Result Value Ref Range Status   Specimen Description BLOOD RIGHT ARM  Final   Special Requests   Final    BOTTLES DRAWN AEROBIC AND ANAEROBIC Blood Culture results may not be optimal due to an inadequate volume of blood received in culture bottles   Culture   Final    NO GROWTH 4 DAYS Performed at [facility], [address], [place], [place] [address]    Report Status PENDING  Incomplete  Culture, blood (Routine X 2) w Reflex to ID Panel     Status: None (Preliminary result)   Collection Time: 03/26/24  4:42 PM   Specimen: BLOOD  Result Value Ref Range Status   Specimen Description BLOOD LEFT  ARM  Final   Special Requests   Final    BOTTLES DRAWN AEROBIC AND ANAEROBIC Blood Culture results may not be optimal due to an inadequate volume of blood received in culture bottles   Culture   Final    NO GROWTH 4 DAYS Performed at [facility], [address], [place], [place] [address]    Report Status PENDING  Incomplete    Labs: CBC: Recent Labs  Lab 03/26/24 0540 03/27/24 0638 03/28/24 0453 03/29/24 0527  WBC 14.7* 10.4 10.1 9.9  NEUTROABS  --  6.7 6.2 6.5  HGB 11.0* 11.5* 11.0* 11.3*  HCT 34.4* 35.2* 34.2* 35.5*  MCV 85.1 82.6 83.8 82.8  PLT 267 255 276 290   Basic Metabolic Panel: Recent Labs  Lab 03/26/24 0540 03/27/24 0638 03/28/24 0453 03/29/24 0527  NA  --  142 142 140  K  --  2.9* 4.4 3.7  CL  --  102 106 101  CO2  --  30 28 30   GLUCOSE  --  101* 135* 118*  BUN  --  10 15 17   CREATININE 0.67 0.70 0.86 0.72  CALCIUM  --  8.5* 9.0 8.8*   Liver Function Tests: No results for input(s): "AST", "ALT", "ALKPHOS", "BILITOT", "PROT", "ALBUMIN" in the last 168 hours. CBG: Recent Labs  Lab 03/26/24 0403  GLUCAP 208*    Discharge time spent: greater than 30 minutes.  Signed: Delfino Lovett, MD [facility] 03/30/2024

## 2024-03-30 NOTE — Care Management Important Message (Deleted)
 Important Message  Patient Details  Name: Alison Richardson MRN: [id number] Date of Birth: [birth year]   Important Message Given:        Marcell Anger 03/30/2024, 10:42 AM

## 2024-03-30 NOTE — TOC Transition Note (Signed)
 Transition of Care [facility]) - Discharge Note   Patient Details  Name: Alison Richardson MRN: [id number] Date of Birth: November 25, 1933  Transition of Care [facility]) CM/SW Contact:  Chapman Fitch, RN Phone Number: 03/30/2024, 10:34 AM   Clinical Narrative:      Per Cala Bradford with [facility] patient is active with their inhouse therapy "[facility]"  Cala Bradford states that she will pull the orders from epic   Barriers to Discharge: Continued Medical Work up   Patient Goals and CMS Choice   CMS Medicare.gov Compare Post Acute Care list provided to:: Patient Represenative (must comment) Choice offered to / list presented to : Adult Children      Discharge Placement                       Discharge Plan and Services Additional resources added to the After Visit Summary for                                       Social Drivers of Health (SDOH) Interventions SDOH Screenings   Food Insecurity: No Food Insecurity (03/26/2024)  Housing: Low Risk  (03/26/2024)  Transportation Needs: No Transportation Needs (03/26/2024)  Utilities: Not At Risk (03/26/2024)  Alcohol Screen: Low Risk  (04/09/2023)  Depression (PHQ2-9): Low Risk  (04/09/2023)  Recent Concern: Depression (PHQ2-9) - Medium Risk (01/15/2023)  Financial Resource Strain: Low Risk  (04/09/2023)  Physical Activity: Insufficiently Active (04/09/2023)  Social Connections: Socially Isolated (03/26/2024)  Stress: No Stress Concern Present (04/09/2023)  Tobacco Use: Low Risk  (03/26/2024)     Readmission Risk Interventions    03/29/2024    4:52 PM 01/26/2022    2:47 PM 11/02/2021   12:38 PM  Readmission Risk Prevention Plan  Transportation Screening Complete Complete Complete  PCP or Specialist Appt within 5-7 Days Complete    PCP or Specialist Appt within 3-5 Days  Complete Complete  Home Care Screening Complete    Medication Review (RN CM) Complete    HRI or Home Care Consult  Complete Complete  Social Work Consult for Recovery Care  Planning/Counseling  Complete Complete  Palliative Care Screening  Not Applicable Not Applicable  Medication Review [employer])  Complete Complete

## 2024-03-31 ENCOUNTER — Other Ambulatory Visit: Payer: Self-pay | Admitting: Pediatrics

## 2024-03-31 LAB — CULTURE, BLOOD (ROUTINE X 2)
Culture: NO GROWTH
Culture: NO GROWTH

## 2024-04-01 ENCOUNTER — Telehealth: Payer: Self-pay

## 2024-04-01 NOTE — Telephone Encounter (Signed)
 Copied from CRM [id number]. Topic: General - Other >> Apr 01, 2024 12:19 PM Emylou G wrote: Reason for CRM: Santina Evans ( nurse ) w/[employer] called..  patient was discharged.. was not sent home with nebulizer or the medication.. she is having trouble making to the bathroom - sense of urgency.. hospital put on paperwork that they told her stop checking her sugar? She needs [facility] referral.   Pls give patient a call... [phone]

## 2024-04-01 NOTE — Telephone Encounter (Signed)
 Requested Prescriptions  Pending Prescriptions Disp Refills   donepezil (ARICEPT) 10 MG tablet [Pharmacy Med Name: DONEPEZIL HCL 10 MG TAB] 90 tablet 0    Sig: TAKE ONE TABLET (10 MG) BY MOUTH AT BEDTIME     Neurology:  Alzheimer's Agents Failed - 04/01/2024  1:31 PM      Failed - Valid encounter within last 6 months    Recent Outpatient Visits   None

## 2024-04-01 NOTE — Telephone Encounter (Signed)
 Patient is scheduled in office for hospital follow up with Simeon Craft, NP.

## 2024-04-02 ENCOUNTER — Ambulatory Visit: Admitting: Nurse Practitioner

## 2024-04-02 ENCOUNTER — Encounter: Payer: Self-pay | Admitting: Nurse Practitioner

## 2024-04-02 VITALS — BP 107/58 | HR 51 | Temp 98.3°F | Resp 17 | Ht 65.98 in | Wt 170.0 lb

## 2024-04-02 DIAGNOSIS — R0683 Snoring: Secondary | ICD-10-CM

## 2024-04-02 DIAGNOSIS — Z09 Encounter for follow-up examination after completed treatment for conditions other than malignant neoplasm: Secondary | ICD-10-CM | POA: Diagnosis not present

## 2024-04-02 DIAGNOSIS — J189 Pneumonia, unspecified organism: Secondary | ICD-10-CM

## 2024-04-02 DIAGNOSIS — A419 Sepsis, unspecified organism: Secondary | ICD-10-CM

## 2024-04-02 DIAGNOSIS — G4733 Obstructive sleep apnea (adult) (pediatric): Secondary | ICD-10-CM | POA: Diagnosis not present

## 2024-04-02 DIAGNOSIS — J4540 Moderate persistent asthma, uncomplicated: Secondary | ICD-10-CM

## 2024-04-02 NOTE — Progress Notes (Unsigned)
 BP (!) 107/58 (BP Location: Left Arm, Patient Position: Sitting, Cuff Size: Normal)   Pulse (!) 51   Temp 98.3 F (36.8 C) (Oral)   Resp 17   Ht 5' 5.98" (1.676 m)   Wt 170 lb (77.1 kg)   SpO2 96%   BMI 27.45 kg/m    Subjective:    Patient ID: Alison Richardson, female    DOB: 11-Jan-1933, 89 y.o.   MRN: [id number]  HPI: Alison Richardson is a 89 y.o. female  Chief Complaint  Patient presents with   Hospitalization Follow-up    Sepsis due to pneumonia Admit date:     03/26/2024 Discharge date: 03/30/24 Still very exhausted, was not mobile in the hospital, slept in bed mostly. Since being home is sleeping well, PT is scheduled to come in to see her at [facility]. A lot of throat clearing but throat is not sore. 50% better then when she was admitted.     Medication Problem    Stopped levo upon admitting and never restarted, should this be the case. Was also told to not check her BG manually, should she be still checking.    Atrial Fibrillation    Was told she no longer by cardio that this is resolved. Does feel better despite being sick.    Cerumen Impaction    Hearing less and wants to be checked. Last time she was here cleaned, 12/06/2023.    Transition of Care Hospital Follow up.   Hospital/Facility: [facility] ([place]) D/C Physician: Dr. Sherryll Burger D/C Date: 03/30/24  Records Requested: NA Records Received: Yes Records Reviewed: Yes  Diagnoses on Discharge:   Sepsis due to pneumonia (HCC) Acute hypoxic respiratory failure secondary to pneumonia Sepsis manifested by leukocytosis as well as tachypnea Chest x-ray showing diffuse infiltrates Patient is clinically improving with treatment and is at baseline now Respiratory viral panel negative COVID flu RSV negative On room air now    Type 2 diabetes mellitus with peripheral neuropathy (HCC) Paroxysmal atrial fibrillation (HCC) Hypothyroidism Continue Synthroid.   Dementia, senile with depression (HCC) Continue Aricept and Effexor XR.    Dyslipidemia Continue statin therapy.   Essential hypertension Continue antihypertensive therapy.  Patient continues to have trouble sleeping.  She does have OSA but not using CPAP.  Unsure of when last sleep study was done.  Will order updated sleep study to get patient treated.  She snores loud and wakes herself up snoring.  Does not have a bed partner to witness apnea episodes. Having ongoing fatigue.  Does improve some with restful sleep.  Patient is requesting home Neb machine and medication.    Date of interactive Contact within 48 hours of discharge:  Contact was through: other  Date of 7 day or 14 day face-to-face visit:    within 7 days  Outpatient Encounter Medications as of 04/02/2024  Medication Sig Note   amiodarone (PACERONE) 200 MG tablet Take 1 tablet (200 mg total) by mouth daily.    apixaban (ELIQUIS) 5 MG TABS tablet Take 1 tablet (5 mg total) by mouth 2 (two) times daily.    donepezil (ARICEPT) 10 MG tablet TAKE ONE TABLET (10 MG) BY MOUTH AT BEDTIME    doxycycline (VIBRA-TABS) 100 MG tablet Take 1 tablet (100 mg total) by mouth every 12 (twelve) hours for 5 days.    gabapentin (NEURONTIN) 100 MG capsule Take 1 capsule (100 mg total) by mouth at bedtime.    glipiZIDE (GLUCOTROL XL) 5 MG 24 hr tablet TAKE ONE TABLET (5 MG)  BY MOUTH ONCE DAILY (Patient taking differently: Take 5 mg by mouth daily with breakfast.)    metoprolol succinate (TOPROL-XL) 50 MG 24 hr tablet Take 1 tablet (50 mg total) by mouth daily. Take with or immediately following a meal.    nitroGLYCERIN (NITROSTAT) 0.4 MG SL tablet Place 1 tablet (0.4 mg total) under the tongue every 5 (five) minutes as needed for chest pain. 03/26/2024: prn   pravastatin (PRAVACHOL) 20 MG tablet Take 1 tablet (20 mg total) by mouth at bedtime.    predniSONE (DELTASONE) 10 MG tablet Take 6 tablets (60 mg total) by mouth daily for 1 day, THEN 5 tablets (50 mg total) daily for 1 day, THEN 4 tablets (40 mg total) daily for 1  day, THEN 3 tablets (30 mg total) daily for 1 day, THEN 2 tablets (20 mg total) daily for 1 day, THEN 1 tablet (10 mg total) daily for 1 day.    Tiotropium Bromide Monohydrate (SPIRIVA RESPIMAT) 1.25 MCG/ACT AERS Inhale into the lungs.    torsemide (DEMADEX) 20 MG tablet Take 1 tablet (20 mg total) by mouth daily.    valsartan (DIOVAN) 40 MG tablet Take 1 tablet (40 mg total) by mouth daily.    venlafaxine XR (EFFEXOR-XR) 75 MG 24 hr capsule TAKE 1 CAPSULE BY MOUTH ONCE DAILY.    albuterol (PROVENTIL) (2.5 MG/3ML) 0.083% nebulizer solution Take 3 mLs (2.5 mg total) by nebulization every 6 (six) hours as needed for wheezing or shortness of breath.    diltiazem (CARDIZEM CD) 240 MG 24 hr capsule Take 240 mg by mouth daily. (Patient not taking: Reported on 04/02/2024)    levothyroxine (SYNTHROID) 88 MCG tablet TAKE 1 TABLET EVERY DAY ON EMPTY STOMACHWITH A GLASS OF WATER AT LEAST 30-60 MINBEFORE BREAKFAST (Patient not taking: Reported on 04/02/2024)    [DISCONTINUED] albuterol (PROVENTIL) (2.5 MG/3ML) 0.083% nebulizer solution Take 3 mLs (2.5 mg total) by nebulization every 6 (six) hours as needed for wheezing or shortness of breath. (Patient not taking: Reported on 04/02/2024)    [DISCONTINUED] benzonatate (TESSALON) 200 MG capsule Take 1 capsule (200 mg total) by mouth 3 (three) times daily. (Patient not taking: Reported on 04/02/2024)    [DISCONTINUED] guaiFENesin (MUCINEX) 600 MG 12 hr tablet Take 1 tablet (600 mg total) by mouth 2 (two) times daily for 5 days. (Patient not taking: Reported on 04/02/2024)    No facility-administered encounter medications on file as of 04/02/2024.    Diagnostic Tests Reviewed/Disposition:   Consults:  Discharge Instructions  Disease/illness Education:  Home Health/Community Services Discussions/Referrals:  Establishment or re-establishment of referral orders for community resources:  Discussion with other health care providers:  Assessment and Support of  treatment regimen adherence:  Appointments Coordinated with:   Education for self-management, independent living, and ADLs:   Relevant past medical, surgical, family and social history reviewed and updated as indicated. Interim medical history since our last visit reviewed. Allergies and medications reviewed and updated.  Review of Systems  Constitutional:  Positive for fatigue.  Respiratory:  Negative for apnea.        Snoring  Psychiatric/Behavioral:  Positive for sleep disturbance.     Per HPI unless specifically indicated above     Objective:    BP (!) 107/58 (BP Location: Left Arm, Patient Position: Sitting, Cuff Size: Normal)   Pulse (!) 51   Temp 98.3 F (36.8 C) (Oral)   Resp 17   Ht 5' 5.98" (1.676 m)   Wt 170 lb (77.1 kg)  SpO2 96%   BMI 27.45 kg/m   Wt Readings from Last 3 Encounters:  04/02/24 170 lb (77.1 kg)  03/26/24 172 lb 13.5 oz (78.4 kg)  12/09/23 167 lb 8 oz (76 kg)    Physical Exam Vitals and nursing note reviewed.  Constitutional:      General: She is not in acute distress.    Appearance: Normal appearance. She is normal weight. She is not ill-appearing, toxic-appearing or diaphoretic.  HENT:     Head: Normocephalic.     Right Ear: External ear normal.     Left Ear: External ear normal.     Nose: Nose normal.     Mouth/Throat:     Mouth: Mucous membranes are moist.     Pharynx: Oropharynx is clear.  Eyes:     General:        Right eye: No discharge.        Left eye: No discharge.     Extraocular Movements: Extraocular movements intact.     Conjunctiva/sclera: Conjunctivae normal.     Pupils: Pupils are equal, round, and reactive to light.  Cardiovascular:     Rate and Rhythm: Normal rate and regular rhythm.     Heart sounds: No murmur heard. Pulmonary:     Effort: Pulmonary effort is normal. No respiratory distress.     Breath sounds: Normal breath sounds. No wheezing or rales.  Musculoskeletal:     Cervical back: Normal range of  motion and neck supple.  Skin:    General: Skin is warm and dry.     Capillary Refill: Capillary refill takes less than 2 seconds.  Neurological:     General: No focal deficit present.     Mental Status: She is alert and oriented to person, place, and time. Mental status is at baseline.  Psychiatric:        Mood and Affect: Mood normal.        Behavior: Behavior normal.        Thought Content: Thought content normal.        Judgment: Judgment normal.     Results for orders placed or performed in visit on 04/02/24  Comp Met (CMET)   Collection Time: 04/02/24  4:31 PM  Result Value Ref Range   Glucose 291 (H) 70 - 99 mg/dL   BUN 25 8 - 27 mg/dL   Creatinine, Ser 1.32 (H) 0.57 - 1.00 mg/dL   eGFR 53 (L) >44 WN/UUV/2.53   BUN/Creatinine Ratio 25 12 - 28   Sodium 138 134 - 144 mmol/L   Potassium 4.3 3.5 - 5.2 mmol/L   Chloride 100 96 - 106 mmol/L   CO2 20 20 - 29 mmol/L   Calcium 8.9 8.7 - 10.3 mg/dL   Total Protein 6.7 6.0 - 8.5 g/dL   Albumin 4.3 3.7 - 4.7 g/dL   Globulin, Total 2.4 1.5 - 4.5 g/dL   Bilirubin Total 0.2 0.0 - 1.2 mg/dL   Alkaline Phosphatase 99 44 - 121 IU/L   AST 21 0 - 40 IU/L   ALT 21 0 - 32 IU/L  CBC w/Diff   Collection Time: 04/02/24  4:31 PM  Result Value Ref Range   WBC 12.3 (H) 3.4 - 10.8 x10E3/uL   RBC 4.37 3.77 - 5.28 x10E6/uL   Hemoglobin 11.6 11.1 - 15.9 g/dL   Hematocrit 66.4 40.3 - 46.6 %   MCV 84 79 - 97 fL   MCH 26.5 (L) 26.6 - 33.0 pg   MCHC  31.6 31.5 - 35.7 g/dL   RDW 40.9 81.1 - 91.4 %   Platelets 434 150 - 450 x10E3/uL   Neutrophils 85 Not Estab. %   Lymphs 12 Not Estab. %   Monocytes 2 Not Estab. %   Eos 0 Not Estab. %   Basos 0 Not Estab. %   Neutrophils Absolute 10.5 (H) 1.4 - 7.0 x10E3/uL   Lymphocytes Absolute 1.5 0.7 - 3.1 x10E3/uL   Monocytes Absolute 0.2 0.1 - 0.9 x10E3/uL   EOS (ABSOLUTE) 0.0 0.0 - 0.4 x10E3/uL   Basophils Absolute 0.0 0.0 - 0.2 x10E3/uL   Immature Granulocytes 1 Not Estab. %   Immature Grans (Abs)  0.1 0.0 - 0.1 x10E3/uL      Assessment & Plan:   Problem List Items Addressed This Visit       Respiratory   Obstructive sleep apnea   Updated sleep study ordered for evaluation of symptoms.  Has not done well with the mask.  Will follow up once results are returned.       Moderate asthma without complication   Patient has had at least 3 episodes of pneumonia in the last 18 months.  May need Pulm evaluation in the future,       Relevant Medications   albuterol (PROVENTIL) (2.5 MG/3ML) 0.083% nebulizer solution   Sepsis due to pneumonia (HCC)   Improved.  Complete course of doxycyline and prednisone.  Follow up in 2 weeks.      Relevant Medications   albuterol (PROVENTIL) (2.5 MG/3ML) 0.083% nebulizer solution   Other Visit Diagnoses       Hospital discharge follow-up    -  Primary   Will repeat labs at visit today.  Complete course of antibiotics and steroids.  Will order Neb machine due to frequent pneumonia episodes.   Relevant Orders   Comp Met (CMET) (Completed)   CBC w/Diff (Completed)     Snoring       Relevant Orders   Ambulatory referral to Sleep Studies        Follow up plan: Return in about 2 weeks (around 04/16/2024).

## 2024-04-03 ENCOUNTER — Inpatient Hospital Stay: Admitting: Nurse Practitioner

## 2024-04-03 ENCOUNTER — Encounter: Payer: Self-pay | Admitting: Nurse Practitioner

## 2024-04-03 ENCOUNTER — Telehealth: Payer: Self-pay | Admitting: Cardiovascular Disease

## 2024-04-03 LAB — CBC WITH DIFFERENTIAL/PLATELET
Basophils Absolute: 0 10*3/uL (ref 0.0–0.2)
Basos: 0 %
EOS (ABSOLUTE): 0 10*3/uL (ref 0.0–0.4)
Eos: 0 %
Hematocrit: 36.7 % (ref 34.0–46.6)
Hemoglobin: 11.6 g/dL (ref 11.1–15.9)
Immature Grans (Abs): 0.1 10*3/uL (ref 0.0–0.1)
Immature Granulocytes: 1 %
Lymphocytes Absolute: 1.5 10*3/uL (ref 0.7–3.1)
Lymphs: 12 %
MCH: 26.5 pg — ABNORMAL LOW (ref 26.6–33.0)
MCHC: 31.6 g/dL (ref 31.5–35.7)
MCV: 84 fL (ref 79–97)
Monocytes Absolute: 0.2 10*3/uL (ref 0.1–0.9)
Monocytes: 2 %
Neutrophils Absolute: 10.5 10*3/uL — ABNORMAL HIGH (ref 1.4–7.0)
Neutrophils: 85 %
Platelets: 434 10*3/uL (ref 150–450)
RBC: 4.37 x10E6/uL (ref 3.77–5.28)
RDW: 14.5 % (ref 11.7–15.4)
WBC: 12.3 10*3/uL — ABNORMAL HIGH (ref 3.4–10.8)

## 2024-04-03 LAB — COMPREHENSIVE METABOLIC PANEL WITH GFR
ALT: 21 IU/L (ref 0–32)
AST: 21 IU/L (ref 0–40)
Albumin: 4.3 g/dL (ref 3.7–4.7)
Alkaline Phosphatase: 99 IU/L (ref 44–121)
BUN/Creatinine Ratio: 25 (ref 12–28)
BUN: 25 mg/dL (ref 8–27)
Bilirubin Total: 0.2 mg/dL (ref 0.0–1.2)
CO2: 20 mmol/L (ref 20–29)
Calcium: 8.9 mg/dL (ref 8.7–10.3)
Chloride: 100 mmol/L (ref 96–106)
Creatinine, Ser: 1.02 mg/dL — ABNORMAL HIGH (ref 0.57–1.00)
Globulin, Total: 2.4 g/dL (ref 1.5–4.5)
Glucose: 291 mg/dL — ABNORMAL HIGH (ref 70–99)
Potassium: 4.3 mmol/L (ref 3.5–5.2)
Sodium: 138 mmol/L (ref 134–144)
Total Protein: 6.7 g/dL (ref 6.0–8.5)
eGFR: 53 mL/min/{1.73_m2} — ABNORMAL LOW (ref >59)

## 2024-04-03 MED ORDER — ALBUTEROL SULFATE (2.5 MG/3ML) 0.083% IN NEBU: 2.5000 mg | INHALATION_SOLUTION | Freq: Four times a day (QID) | RESPIRATORY_TRACT | 3 refills | Status: AC | PRN

## 2024-04-03 NOTE — Telephone Encounter (Signed)
 Order completed through [employer] for nebulizer machine itself. Order for medication was sent to patients preferred pharmacy.

## 2024-04-03 NOTE — Telephone Encounter (Signed)
 Pt's nurses from [facility] called and stated that per Pharmacist, pt's Diltiazem should be considered discontinued due to worsening of HF. Metoprolol dosage adjusted and would like to know if pt is to be taking Potassium 10 MEQ. Nurse would like for pt to be c/b regarding this matter. Please advise

## 2024-04-03 NOTE — Assessment & Plan Note (Signed)
 Updated sleep study ordered for evaluation of symptoms.  Has not done well with the mask.  Will follow up once results are returned.

## 2024-04-03 NOTE — Telephone Encounter (Signed)
 Copied from CRM [id number]. Topic: Clinical - Medication Question >> Apr 03, 2024  9:20 AM Izetta Dakin wrote: Reason for CRM: Natalia Leatherwood, RN with [facility], calling on behalf of patient. She states that following the patient's hospital discharge, patient was suppose to receive nebulizer machine and medication. She states patient has not received either and would like for them to be sent to [facility] pharmacy. She ask that we contact patient for followup. Patient contact # [phone]

## 2024-04-03 NOTE — Assessment & Plan Note (Signed)
 Improved.  Complete course of doxycyline and prednisone.  Follow up in 2 weeks.

## 2024-04-03 NOTE — Assessment & Plan Note (Signed)
 Patient has had at least 3 episodes of pneumonia in the last 18 months.  May need Pulm evaluation in the future,

## 2024-04-09 ENCOUNTER — Ambulatory Visit: Payer: Self-pay | Admitting: Pediatrics

## 2024-04-09 ENCOUNTER — Ambulatory Visit: Admitting: Pediatrics

## 2024-04-09 VITALS — BP 123/67 | HR 68 | Temp 97.5°F | Wt 168.4 lb

## 2024-04-09 DIAGNOSIS — R5383 Other fatigue: Secondary | ICD-10-CM

## 2024-04-09 DIAGNOSIS — E1142 Type 2 diabetes mellitus with diabetic polyneuropathy: Secondary | ICD-10-CM

## 2024-04-09 DIAGNOSIS — E538 Deficiency of other specified B group vitamins: Secondary | ICD-10-CM | POA: Diagnosis not present

## 2024-04-09 LAB — MICROALBUMIN, URINE WAIVED
Creatinine, Urine Waived: 200 mg/dL (ref 10–300)
Microalb, Ur Waived: 80 mg/L — ABNORMAL HIGH (ref 0–19)
Microalb/Creat Ratio: <30 mg/g (ref <30)

## 2024-04-09 NOTE — Patient Instructions (Addendum)
 Will follow up in 3 months, pending labs we might see each other sooner

## 2024-04-09 NOTE — Progress Notes (Signed)
 Office Visit  BP 123/67   Pulse 68   Temp (!) 97.5 F (36.4 C) (Oral)   Wt 168 lb 6.4 oz (76.4 kg)   SpO2 97%   BMI 27.19 kg/m    Subjective:    Patient ID: Alison Richardson, female    DOB: 1933/10/23, 89 y.o.   MRN: [id number]  HPI: Alison Richardson is a 89 y.o. female  Chief Complaint  Patient presents with   Fatigue    Discussed the use of AI scribe software for clinical note transcription with the patient, who gave verbal consent to proceed.  History of Present Illness   Alison Richardson is an 89 year old female who presents with persistent fatigue following a recent hospital stay for pneumonia.  She experiences ongoing fatigue that has not improved since her discharge from the hospital. The fatigue remains unchanged from last week. Her recent hospitalization for pneumonia has resolved, and her breathing is stable. She uses a breathing device for any breathing difficulties.  She has increased her use of a walker since her hospital discharge and is scheduled to start home physical therapy next week. She has not experienced any recent falls but describes her movements as 'pretty intensive'.  She has not seen the results of her recent lab work but is aware of an elevated white blood cell count and slightly decreased kidney function, likely due to dehydration.  She is due for a sleep study and an A1c test. She has a history of thyroid  issues and B12 deficiency, which are being monitored.  There have been no recent changes in her medication regimen, and she does not require any refills at this time. She relies on transportation services for medical appointments and has experienced delays in their availability.      Relevant past medical, surgical, family and social history reviewed and updated as indicated. Interim medical history since our last visit reviewed. Allergies and medications reviewed and updated.  ROS per HPI unless specifically indicated above     Objective:     BP 123/67   Pulse 68   Temp (!) 97.5 F (36.4 C) (Oral)   Wt 168 lb 6.4 oz (76.4 kg)   SpO2 97%   BMI 27.19 kg/m   Wt Readings from Last 3 Encounters:  04/13/24 170 lb 9.6 oz (77.4 kg)  04/09/24 168 lb 6.4 oz (76.4 kg)  04/02/24 170 lb (77.1 kg)     Physical Exam Constitutional:      Appearance: Normal appearance.  HENT:     Head: Normocephalic and atraumatic.  Eyes:     Pupils: Pupils are equal, round, and reactive to light.  Cardiovascular:     Rate and Rhythm: Normal rate and regular rhythm.     Pulses: Normal pulses.     Heart sounds: Normal heart sounds.  Pulmonary:     Effort: Pulmonary effort is normal.     Breath sounds: Normal breath sounds.  Musculoskeletal:        General: Normal range of motion.     Cervical back: Normal range of motion.  Skin:    General: Skin is warm and dry.     Capillary Refill: Capillary refill takes less than 2 seconds.  Neurological:     General: No focal deficit present.     Mental Status: She is alert. Mental status is at baseline.  Psychiatric:        Mood and Affect: Mood normal.        Behavior: Behavior normal.  04/02/2024    4:01 PM 04/09/2023   11:04 AM 01/15/2023    3:47 PM 08/21/2022    1:47 PM 03/20/2022    9:35 AM  Depression screen PHQ 2/9  Decreased Interest -- 0 0 0 0  Down, Depressed, Hopeless  0 0 0 0  PHQ - 2 Score  0 0 0 0  Altered sleeping  0 3    Tired, decreased energy  0 3    Change in appetite  0 0    Feeling bad or failure about yourself   0 0    Trouble concentrating  0 0    Moving slowly or fidgety/restless  0 0    Suicidal thoughts  0 0    PHQ-9 Score  0 6    Difficult doing work/chores  Not difficult at all Not difficult at all         09/10/2023    5:03 PM 01/15/2023    3:48 PM 08/21/2022    1:48 PM 01/11/2022   10:23 AM  GAD 7 : Generalized Anxiety Score  Nervous, Anxious, on Edge  0 0 1  Control/stop worrying  0 1 1  Worry too much - different things  0 1 1  Trouble relaxing   0 0 0  Restless  0 0 0  Easily annoyed or irritable  0 0 0  Afraid - awful might happen  0 0 0  Total GAD 7 Score  0 2 3  Anxiety Difficulty Somewhat difficult Not difficult at all Somewhat difficult Somewhat difficult       Assessment & Plan:  Assessment & Plan   Other fatigue Persistent fatigue post-hospitalization. Differential includes residual hospitalization effects, infection, dehydration, thyroid  dysfunction, vitamin B12 deficiency, or uncontrolled diabetes. Sleep study scheduled for sleep apnea evaluation. Increased walker use, home physical therapy to start. - Repeat CBC and electrolytes. - Order hemoglobin A1c, thyroid  function tests, and vitamin B12 level. - Ensure completion of sleep study. - Initiate home physical therapy. -     CBC with Differential/Platelet -     Thyroid  Panel With TSH -     Vitamin B12  Type 2 diabetes mellitus with peripheral neuropathy (HCC) Assessment & Plan: Due for below labs. Recent tx for pneumonia with steroids suspect will affect it to some degree. Currrently diet controlled so if elevated will consider treatment given above fatigue may be driven by hyperglycemia.  Orders: -     Comprehensive metabolic panel with GFR -     Hemoglobin A1c -     Microalbumin, Urine Waived  Vitamin B12 deficiency May be contributing to fatigue. Will repeat as below -     CBC with Differential/Platelet -     Vitamin B12   Follow up plan: Return in about 3 months (around 07/09/2024) for Chronic illness f/u.  Hadassah Letters, MD

## 2024-04-10 LAB — COMPREHENSIVE METABOLIC PANEL WITH GFR
ALT: 18 IU/L (ref 0–32)
AST: 15 IU/L (ref 0–40)
Albumin: 4.2 g/dL (ref 3.7–4.7)
Alkaline Phosphatase: 107 IU/L (ref 44–121)
BUN/Creatinine Ratio: 16 (ref 12–28)
BUN: 15 mg/dL (ref 8–27)
Bilirubin Total: 0.2 mg/dL (ref 0.0–1.2)
CO2: 21 mmol/L (ref 20–29)
Calcium: 9.3 mg/dL (ref 8.7–10.3)
Chloride: 99 mmol/L (ref 96–106)
Creatinine, Ser: 0.95 mg/dL (ref 0.57–1.00)
Globulin, Total: 2.4 g/dL (ref 1.5–4.5)
Glucose: 196 mg/dL — ABNORMAL HIGH (ref 70–99)
Potassium: 3.5 mmol/L (ref 3.5–5.2)
Sodium: 140 mmol/L (ref 134–144)
Total Protein: 6.6 g/dL (ref 6.0–8.5)
eGFR: 57 mL/min/{1.73_m2} — ABNORMAL LOW (ref >59)

## 2024-04-10 LAB — CBC WITH DIFFERENTIAL/PLATELET
Basophils Absolute: 0.1 10*3/uL (ref 0.0–0.2)
Basos: 0 %
EOS (ABSOLUTE): 0.1 10*3/uL (ref 0.0–0.4)
Eos: 1 %
Hematocrit: 38.6 % (ref 34.0–46.6)
Hemoglobin: 12.1 g/dL (ref 11.1–15.9)
Immature Grans (Abs): 0.1 10*3/uL (ref 0.0–0.1)
Immature Granulocytes: 1 %
Lymphocytes Absolute: 3.5 10*3/uL — ABNORMAL HIGH (ref 0.7–3.1)
Lymphs: 23 %
MCH: 26.7 pg (ref 26.6–33.0)
MCHC: 31.3 g/dL — ABNORMAL LOW (ref 31.5–35.7)
MCV: 85 fL (ref 79–97)
Monocytes Absolute: 1.4 10*3/uL — ABNORMAL HIGH (ref 0.1–0.9)
Monocytes: 9 %
Neutrophils Absolute: 9.9 10*3/uL — ABNORMAL HIGH (ref 1.4–7.0)
Neutrophils: 66 %
Platelets: 410 10*3/uL (ref 150–450)
RBC: 4.53 x10E6/uL (ref 3.77–5.28)
RDW: 14.5 % (ref 11.7–15.4)
WBC: 15.1 10*3/uL — ABNORMAL HIGH (ref 3.4–10.8)

## 2024-04-10 LAB — THYROID PANEL WITH TSH
Free Thyroxine Index: 3.5 (ref 1.2–4.9)
T3 Uptake Ratio: 34 % (ref 24–39)
T4, Total: 10.4 ug/dL (ref 4.5–12.0)
TSH: 3.8 u[IU]/mL (ref 0.450–4.500)

## 2024-04-10 LAB — VITAMIN B12: Vitamin B-12: 325 pg/mL (ref 232–1245)

## 2024-04-10 LAB — HEMOGLOBIN A1C
Est. average glucose Bld gHb Est-mCnc: 189 mg/dL
Hgb A1c MFr Bld: 8.2 % — ABNORMAL HIGH (ref 4.8–5.6)

## 2024-04-13 ENCOUNTER — Encounter: Payer: Self-pay | Admitting: Nurse Practitioner

## 2024-04-13 ENCOUNTER — Ambulatory Visit: Admitting: Nurse Practitioner

## 2024-04-13 VITALS — BP 106/64 | HR 58 | Temp 98.5°F | Resp 16 | Ht 65.98 in | Wt 170.6 lb

## 2024-04-13 DIAGNOSIS — D72829 Elevated white blood cell count, unspecified: Secondary | ICD-10-CM

## 2024-04-13 NOTE — Progress Notes (Signed)
 BP 106/64 (BP Location: Left Arm, Patient Position: Sitting, Cuff Size: Normal)   Pulse (!) 58   Temp 98.5 F (36.9 C) (Oral)   Resp 16   Ht 5' 5.98" (1.676 m)   Wt 170 lb 9.6 oz (77.4 kg)   SpO2 98%   BMI 27.55 kg/m    Subjective:    Patient ID: Alison Richardson, female    DOB: 1933/01/20, 89 y.o.   MRN: [id number]  HPI: Alison Richardson is a 89 y.o. female  Chief Complaint  Patient presents with   Fatigue    The same.    Patient presents today to follow up after her last visit.  Patient's WBC has continued to rise.  She continues to battle fatigue.  She has received the supplies to do a home sleep study test and plans to get this done.  She still is not sleeping well.     Relevant past medical, surgical, family and social history reviewed and updated as indicated. Interim medical history since our last visit reviewed. Allergies and medications reviewed and updated.  Review of Systems  Constitutional:  Positive for fatigue.    Per HPI unless specifically indicated above     Objective:    BP 106/64 (BP Location: Left Arm, Patient Position: Sitting, Cuff Size: Normal)   Pulse (!) 58   Temp 98.5 F (36.9 C) (Oral)   Resp 16   Ht 5' 5.98" (1.676 m)   Wt 170 lb 9.6 oz (77.4 kg)   SpO2 98%   BMI 27.55 kg/m   Wt Readings from Last 3 Encounters:  04/13/24 170 lb 9.6 oz (77.4 kg)  04/09/24 168 lb 6.4 oz (76.4 kg)  04/02/24 170 lb (77.1 kg)    Physical Exam Vitals and nursing note reviewed.  Constitutional:      General: She is not in acute distress.    Appearance: Normal appearance. She is normal weight. She is not ill-appearing, toxic-appearing or diaphoretic.  HENT:     Head: Normocephalic.     Right Ear: External ear normal.     Left Ear: External ear normal.     Nose: Nose normal.     Mouth/Throat:     Mouth: Mucous membranes are moist.     Pharynx: Oropharynx is clear.  Eyes:     General:        Right eye: No discharge.        Left eye: No discharge.      Extraocular Movements: Extraocular movements intact.     Conjunctiva/sclera: Conjunctivae normal.     Pupils: Pupils are equal, round, and reactive to light.  Cardiovascular:     Rate and Rhythm: Normal rate and regular rhythm.     Heart sounds: No murmur heard. Pulmonary:     Effort: Pulmonary effort is normal. No respiratory distress.     Breath sounds: Normal breath sounds. No wheezing or rales.  Musculoskeletal:     Cervical back: Normal range of motion and neck supple.  Skin:    General: Skin is warm and dry.     Capillary Refill: Capillary refill takes less than 2 seconds.  Neurological:     General: No focal deficit present.     Mental Status: She is alert and oriented to person, place, and time. Mental status is at baseline.  Psychiatric:        Mood and Affect: Mood normal.        Behavior: Behavior normal.  Thought Content: Thought content normal.        Judgment: Judgment normal.     Results for orders placed or performed in visit on 04/09/24  Microalbumin, Urine Waived (STAT)   Collection Time: 04/09/24  2:54 PM  Result Value Ref Range   Microalb, Ur Waived 80 (H) 0 - 19 mg/L   Creatinine, Urine Waived 200 10 - 300 mg/dL   Microalb/Creat Ratio <30 <30 mg/g  CBC w/Diff   Collection Time: 04/09/24  2:57 PM  Result Value Ref Range   WBC 15.1 (H) 3.4 - 10.8 x10E3/uL   RBC 4.53 3.77 - 5.28 x10E6/uL   Hemoglobin 12.1 11.1 - 15.9 g/dL   Hematocrit 78.2 95.6 - 46.6 %   MCV 85 79 - 97 fL   MCH 26.7 26.6 - 33.0 pg   MCHC 31.3 (L) 31.5 - 35.7 g/dL   RDW 21.3 08.6 - 57.8 %   Platelets 410 150 - 450 x10E3/uL   Neutrophils 66 Not Estab. %   Lymphs 23 Not Estab. %   Monocytes 9 Not Estab. %   Eos 1 Not Estab. %   Basos 0 Not Estab. %   Neutrophils Absolute 9.9 (H) 1.4 - 7.0 x10E3/uL   Lymphocytes Absolute 3.5 (H) 0.7 - 3.1 x10E3/uL   Monocytes Absolute 1.4 (H) 0.1 - 0.9 x10E3/uL   EOS (ABSOLUTE) 0.1 0.0 - 0.4 x10E3/uL   Basophils Absolute 0.1 0.0 - 0.2  x10E3/uL   Immature Granulocytes 1 Not Estab. %   Immature Grans (Abs) 0.1 0.0 - 0.1 x10E3/uL  Comp Met (CMET)   Collection Time: 04/09/24  2:57 PM  Result Value Ref Range   Glucose 196 (H) 70 - 99 mg/dL   BUN 15 8 - 27 mg/dL   Creatinine, Ser 4.69 0.57 - 1.00 mg/dL   eGFR 57 (L) >62 XB/MWU/1.32   BUN/Creatinine Ratio 16 12 - 28   Sodium 140 134 - 144 mmol/L   Potassium 3.5 3.5 - 5.2 mmol/L   Chloride 99 96 - 106 mmol/L   CO2 21 20 - 29 mmol/L   Calcium  9.3 8.7 - 10.3 mg/dL   Total Protein 6.6 6.0 - 8.5 g/dL   Albumin 4.2 3.7 - 4.7 g/dL   Globulin, Total 2.4 1.5 - 4.5 g/dL   Bilirubin Total 0.2 0.0 - 1.2 mg/dL   Alkaline Phosphatase 107 44 - 121 IU/L   AST 15 0 - 40 IU/L   ALT 18 0 - 32 IU/L  HgB A1c   Collection Time: 04/09/24  2:57 PM  Result Value Ref Range   Hgb A1c MFr Bld 8.2 (H) 4.8 - 5.6 %   Est. average glucose Bld gHb Est-mCnc 189 mg/dL  Thyroid  Panel With TSH   Collection Time: 04/09/24  2:57 PM  Result Value Ref Range   TSH 3.800 0.450 - 4.500 uIU/mL   T4, Total 10.4 4.5 - 12.0 ug/dL   T3 Uptake Ratio 34 24 - 39 %   Free Thyroxine Index 3.5 1.2 - 4.9  B12   Collection Time: 04/09/24  2:57 PM  Result Value Ref Range   Vitamin B-12 325 232 - 1,245 pg/mL      Assessment & Plan:   Problem List Items Addressed This Visit   None Visit Diagnoses       Leukocytosis, unspecified type    -  Primary   Elevated at last two visits after hospitalization.  Will repeat labs at visit today.  Will add Rheumatology labs due to  family history.   Relevant Orders   Rheumatoid factor   ANA   ANA+ENA+DNA/DS+Antich+Centr   CK   Uric acid   Comprehensive metabolic panel with GFR   CBC with Differential/Platelet        Follow up plan: No follow-ups on file.

## 2024-04-14 ENCOUNTER — Encounter: Payer: Self-pay | Admitting: Nurse Practitioner

## 2024-04-17 LAB — CBC WITH DIFFERENTIAL/PLATELET
Basophils Absolute: 0.1 10*3/uL (ref 0.0–0.2)
Basos: 0 %
EOS (ABSOLUTE): 0.2 10*3/uL (ref 0.0–0.4)
Eos: 2 %
Hematocrit: 35.7 % (ref 34.0–46.6)
Hemoglobin: 11.2 g/dL (ref 11.1–15.9)
Immature Grans (Abs): 0 10*3/uL (ref 0.0–0.1)
Immature Granulocytes: 0 %
Lymphocytes Absolute: 2.5 10*3/uL (ref 0.7–3.1)
Lymphs: 22 %
MCH: 26.7 pg (ref 26.6–33.0)
MCHC: 31.4 g/dL — ABNORMAL LOW (ref 31.5–35.7)
MCV: 85 fL (ref 79–97)
Monocytes Absolute: 1 10*3/uL — ABNORMAL HIGH (ref 0.1–0.9)
Monocytes: 9 %
Neutrophils Absolute: 7.7 10*3/uL — ABNORMAL HIGH (ref 1.4–7.0)
Neutrophils: 67 %
Platelets: 419 10*3/uL (ref 150–450)
RBC: 4.2 x10E6/uL (ref 3.77–5.28)
RDW: 14.6 % (ref 11.7–15.4)
WBC: 11.4 10*3/uL — ABNORMAL HIGH (ref 3.4–10.8)

## 2024-04-17 LAB — COMPREHENSIVE METABOLIC PANEL WITH GFR
ALT: 15 IU/L (ref 0–32)
AST: 15 IU/L (ref 0–40)
Albumin: 4 g/dL (ref 3.7–4.7)
Alkaline Phosphatase: 92 IU/L (ref 44–121)
BUN/Creatinine Ratio: 17 (ref 12–28)
BUN: 12 mg/dL (ref 8–27)
Bilirubin Total: 0.2 mg/dL (ref 0.0–1.2)
CO2: 23 mmol/L (ref 20–29)
Calcium: 8.8 mg/dL (ref 8.7–10.3)
Chloride: 104 mmol/L (ref 96–106)
Creatinine, Ser: 0.71 mg/dL (ref 0.57–1.00)
Globulin, Total: 2.1 g/dL (ref 1.5–4.5)
Glucose: 167 mg/dL — ABNORMAL HIGH (ref 70–99)
Potassium: 3.8 mmol/L (ref 3.5–5.2)
Sodium: 142 mmol/L (ref 134–144)
Total Protein: 6.1 g/dL (ref 6.0–8.5)
eGFR: 81 mL/min/{1.73_m2} (ref >59)

## 2024-04-17 LAB — ANA+ENA+DNA/DS+ANTICH+CENTR
Anti JO-1: <0.2 AI (ref 0.0–0.9)
Centromere Ab Screen: <0.2 AI (ref 0.0–0.9)
Chromatin Ab SerPl-aCnc: <0.2 AI (ref 0.0–0.9)
ENA RNP Ab: 0.2 AI (ref 0.0–0.9)
ENA SM Ab Ser-aCnc: <0.2 AI (ref 0.0–0.9)
ENA SSA (RO) Ab: <0.2 AI (ref 0.0–0.9)
ENA SSB (LA) Ab: <0.2 AI (ref 0.0–0.9)
Scleroderma (Scl-70) (ENA) Antibody, IgG: <0.2 AI (ref 0.0–0.9)
dsDNA Ab: 1 [IU]/mL (ref 0–9)

## 2024-04-17 LAB — URIC ACID: Uric Acid: 5.3 mg/dL (ref 3.1–7.9)

## 2024-04-17 LAB — RHEUMATOID FACTOR: Rheumatoid fact SerPl-aCnc: <10 [IU]/mL (ref <14.0)

## 2024-04-17 LAB — ANA: Anti Nuclear Antibody (ANA): NEGATIVE

## 2024-04-17 LAB — CK: Total CK: 28 U/L (ref 26–161)

## 2024-04-20 ENCOUNTER — Encounter: Payer: Self-pay | Admitting: Pediatrics

## 2024-04-20 NOTE — Assessment & Plan Note (Signed)
 Due for below labs. Recent tx for pneumonia with steroids suspect will affect it to some degree. Currrently diet controlled so if elevated will consider treatment given above fatigue may be driven by hyperglycemia.

## 2024-05-05 ENCOUNTER — Encounter: Payer: Self-pay | Admitting: Nurse Practitioner

## 2024-05-05 ENCOUNTER — Ambulatory Visit: Admitting: Nurse Practitioner

## 2024-05-05 VITALS — BP 93/57 | HR 66 | Temp 98.7°F | Resp 15 | Ht 65.98 in | Wt 169.9 lb

## 2024-05-05 DIAGNOSIS — K649 Unspecified hemorrhoids: Secondary | ICD-10-CM

## 2024-05-05 DIAGNOSIS — Z7985 Long-term (current) use of injectable non-insulin antidiabetic drugs: Secondary | ICD-10-CM

## 2024-05-05 DIAGNOSIS — E1142 Type 2 diabetes mellitus with diabetic polyneuropathy: Secondary | ICD-10-CM

## 2024-05-05 DIAGNOSIS — E1159 Type 2 diabetes mellitus with other circulatory complications: Secondary | ICD-10-CM

## 2024-05-05 DIAGNOSIS — I152 Hypertension secondary to endocrine disorders: Secondary | ICD-10-CM | POA: Diagnosis not present

## 2024-05-05 MED ORDER — OZEMPIC (0.25 OR 0.5 MG/DOSE) 2 MG/1.5ML ~~LOC~~ SOPN: 0.5000 mg | PEN_INJECTOR | SUBCUTANEOUS | 1 refills | Status: DC

## 2024-05-05 NOTE — Progress Notes (Signed)
 BP (!) 93/57 (BP Location: Right Arm, Patient Position: Sitting, Cuff Size: Normal)   Pulse 66   Temp 98.7 F (37.1 C) (Oral)   Resp 15   Ht 5' 5.98" (1.676 m)   Wt 169 lb 14.4 oz (77.1 kg)   SpO2 97%   BMI 27.44 kg/m    Subjective:    Patient ID: Alison Richardson, female    DOB: 1933/01/18, 89 y.o.   MRN: [id number]  HPI: Alison Richardson is a 89 y.o. female  Chief Complaint  Patient presents with   Diabetes    Only checks when she feels like it. Did feel shaky today and some yesterday. Did notice her heart racing as well.    Insomnia    Follow up on results from [facility]   Ear Fullness    Just wants to be sure they are not clogged   Hemorrhoids    Would like to consider surgical removal.    DIABETES Hypoglycemic episodes:no Polydipsia/polyuria: no Visual disturbance: no Chest pain: no Paresthesias: no Glucose Monitoring: no  Accucheck frequency: Not Checking  Fasting glucose:  Post prandial:  Evening:  Before meals: Taking Insulin ?: no  Long acting insulin :  Short acting insulin : Blood Pressure Monitoring: not checking   Patient is feeling lightheaded in the office today and has been for the last couple of days.      Relevant past medical, surgical, family and social history reviewed and updated as indicated. Interim medical history since our last visit reviewed. Allergies and medications reviewed and updated.  Review of Systems  Eyes:  Negative for visual disturbance.  Cardiovascular:  Negative for chest pain.  Endocrine: Negative for polydipsia and polyuria.  Neurological:  Positive for light-headedness. Negative for numbness.    Per HPI unless specifically indicated above     Objective:     BP (!) 93/57 (BP Location: Right Arm, Patient Position: Sitting, Cuff Size: Normal)   Pulse 66   Temp 98.7 F (37.1 C) (Oral)   Resp 15   Ht 5' 5.98" (1.676 m)   Wt 169 lb 14.4 oz (77.1 kg)   SpO2 97%   BMI 27.44 kg/m   Wt Readings from Last 3 Encounters:   05/05/24 169 lb 14.4 oz (77.1 kg)  04/13/24 170 lb 9.6 oz (77.4 kg)  04/09/24 168 lb 6.4 oz (76.4 kg)    Physical Exam Vitals and nursing note reviewed.  Constitutional:      General: She is not in acute distress.    Appearance: Normal appearance. She is not ill-appearing, toxic-appearing or diaphoretic.  HENT:     Head: Normocephalic.     Right Ear: External ear normal.     Left Ear: External ear normal.     Nose: Nose normal.     Mouth/Throat:     Mouth: Mucous membranes are moist.     Pharynx: Oropharynx is clear.  Eyes:     General:        Right eye: No discharge.        Left eye: No discharge.     Extraocular Movements: Extraocular movements intact.     Conjunctiva/sclera: Conjunctivae normal.     Pupils: Pupils are equal, round, and reactive to light.  Cardiovascular:     Rate and Rhythm: Normal rate and regular rhythm.     Heart sounds: No murmur heard. Pulmonary:     Effort: Pulmonary effort is normal. No respiratory distress.     Breath sounds: Normal breath sounds. No wheezing  or rales.  Musculoskeletal:     Cervical back: Normal range of motion and neck supple.  Skin:    General: Skin is warm and dry.     Capillary Refill: Capillary refill takes less than 2 seconds.  Neurological:     General: No focal deficit present.     Mental Status: She is alert and oriented to person, place, and time. Mental status is at baseline.  Psychiatric:        Mood and Affect: Mood normal.        Behavior: Behavior normal.        Thought Content: Thought content normal.        Judgment: Judgment normal.     Results for orders placed or performed in visit on 04/13/24  Rheumatoid factor   Collection Time: 04/13/24  1:57 PM  Result Value Ref Range   Rheumatoid fact SerPl-aCnc <10.0 <14.0 IU/mL  ANA   Collection Time: 04/13/24  1:57 PM  Result Value Ref Range   Anti Nuclear Antibody (ANA) Negative Negative  ANA+ENA+DNA/DS+Antich+Centr   Collection Time: 04/13/24  1:57 PM   Result Value Ref Range   ANA Titer 1 Negative    dsDNA Ab 1 0 - 9 IU/mL   ENA RNP Ab 0.2 0.0 - 0.9 AI   ENA SM Ab Ser-aCnc <0.2 0.0 - 0.9 AI   Scleroderma (Scl-70) (ENA) Antibody, IgG <0.2 0.0 - 0.9 AI   ENA SSA (RO) Ab <0.2 0.0 - 0.9 AI   ENA SSB (LA) Ab <0.2 0.0 - 0.9 AI   Chromatin Ab SerPl-aCnc <0.2 0.0 - 0.9 AI   Anti JO-1 <0.2 0.0 - 0.9 AI   Centromere Ab Screen <0.2 0.0 - 0.9 AI   See below: Comment   CK   Collection Time: 04/13/24  1:57 PM  Result Value Ref Range   Total CK 28 26 - 161 U/L  Uric acid   Collection Time: 04/13/24  1:57 PM  Result Value Ref Range   Uric Acid 5.3 3.1 - 7.9 mg/dL  Comprehensive metabolic panel with GFR   Collection Time: 04/13/24  1:57 PM  Result Value Ref Range   Glucose 167 (H) 70 - 99 mg/dL   BUN 12 8 - 27 mg/dL   Creatinine, Ser 9.60 0.57 - 1.00 mg/dL   eGFR 81 >45 WU/JWJ/1.91   BUN/Creatinine Ratio 17 12 - 28   Sodium 142 134 - 144 mmol/L   Potassium 3.8 3.5 - 5.2 mmol/L   Chloride 104 96 - 106 mmol/L   CO2 23 20 - 29 mmol/L   Calcium  8.8 8.7 - 10.3 mg/dL   Total Protein 6.1 6.0 - 8.5 g/dL   Albumin 4.0 3.7 - 4.7 g/dL   Globulin, Total 2.1 1.5 - 4.5 g/dL   Bilirubin Total 0.2 0.0 - 1.2 mg/dL   Alkaline Phosphatase 92 44 - 121 IU/L   AST 15 0 - 40 IU/L   ALT 15 0 - 32 IU/L  CBC with Differential/Platelet   Collection Time: 04/13/24  1:57 PM  Result Value Ref Range   WBC 11.4 (H) 3.4 - 10.8 x10E3/uL   RBC 4.20 3.77 - 5.28 x10E6/uL   Hemoglobin 11.2 11.1 - 15.9 g/dL   Hematocrit 47.8 29.5 - 46.6 %   MCV 85 79 - 97 fL   MCH 26.7 26.6 - 33.0 pg   MCHC 31.4 (L) 31.5 - 35.7 g/dL   RDW 62.1 30.8 - 65.7 %   Platelets 419 150 - 450  x10E3/uL   Neutrophils 67 Not Estab. %   Lymphs 22 Not Estab. %   Monocytes 9 Not Estab. %   Eos 2 Not Estab. %   Basos 0 Not Estab. %   Neutrophils Absolute 7.7 (H) 1.4 - 7.0 x10E3/uL   Lymphocytes Absolute 2.5 0.7 - 3.1 x10E3/uL   Monocytes Absolute 1.0 (H) 0.1 - 0.9 x10E3/uL   EOS (ABSOLUTE)  0.2 0.0 - 0.4 x10E3/uL   Basophils Absolute 0.1 0.0 - 0.2 x10E3/uL   Immature Granulocytes 0 Not Estab. %   Immature Grans (Abs) 0.0 0.0 - 0.1 x10E3/uL      Assessment & Plan:   Problem List Items Addressed This Visit       Cardiovascular and Mediastinum   Hypertension associated with diabetes (HCC)   Chronic.  Low in office today.  Will stop Valsartan  today.  Follow up in 3 weeks.  Call sooner if concerns arise.       Relevant Medications   diltiazem  (CARDIZEM  CD) 240 MG 24 hr capsule   Semaglutide,0.25 or 0.5MG /DOS, (OZEMPIC, 0.25 OR 0.5 MG/DOSE,) 2 MG/1.5ML SOPN     Endocrine   Type 2 diabetes mellitus with peripheral neuropathy (HCC) - Primary   Chronic. Will start Ozempic.  Started Ozempic 0.25mg  weekly.  Sample given in office.  After first 4 weeks will increase to 0.5mg  weekly.  Side effects and benefits of medications discussed during visit.  First injection given office today.  Follow up in 3 weeks with PCP.      Relevant Medications   Semaglutide,0.25 or 0.5MG /DOS, (OZEMPIC, 0.25 OR 0.5 MG/DOSE,) 2 MG/1.5ML SOPN   Other Visit Diagnoses       Hemorrhoids, unspecified hemorrhoid type       Relevant Medications   diltiazem  (CARDIZEM  CD) 240 MG 24 hr capsule   Other Relevant Orders   Ambulatory referral to General Surgery        Follow up plan: Return in about 3 weeks (around 05/26/2024) for BP Check.

## 2024-05-05 NOTE — Assessment & Plan Note (Signed)
 Chronic.  Low in office today.  Will stop Valsartan  today.  Follow up in 3 weeks.  Call sooner if concerns arise.

## 2024-05-05 NOTE — Assessment & Plan Note (Signed)
 Chronic. Will start Ozempic.  Started Ozempic 0.25mg  weekly.  Sample given in office.  After first 4 weeks will increase to 0.5mg  weekly.  Side effects and benefits of medications discussed during visit.  First injection given office today.  Follow up in 3 weeks with PCP.

## 2024-05-06 ENCOUNTER — Telehealth: Payer: Self-pay | Admitting: Pediatrics

## 2024-05-06 NOTE — Telephone Encounter (Unsigned)
 Copied from CRM [id number]. Topic: Clinical - Medication Question >> May 06, 2024 11:03 AM Turkey B wrote: Reason for CRM:  pt called in about med that pt states Dr Juliette Oh was to send it for ear fluid , but doesn't know the name of it

## 2024-05-06 NOTE — Telephone Encounter (Signed)
 Copied from CRM [id number]. Topic: Clinical - Medication Question >> May 06, 2024 11:03 AM Turkey B wrote: Reason for CRM:  pt called in about med that pt states Dr Juliette Oh was to send it for ear fluid , but doesn't know the name of it

## 2024-05-06 NOTE — Telephone Encounter (Signed)
 Verbally spoke with provider Mariah Shines and was informed that patient was informed to take Zyrtec for the ear fluid. Informed patient.

## 2024-05-06 NOTE — Addendum Note (Signed)
 Addended by: Aileen Alexanders on: 05/06/2024 01:38 PM   Modules accepted: Orders

## 2024-05-08 ENCOUNTER — Telehealth: Payer: Self-pay | Admitting: Nurse Practitioner

## 2024-05-08 ENCOUNTER — Other Ambulatory Visit: Payer: Self-pay

## 2024-05-08 ENCOUNTER — Encounter: Payer: Self-pay | Admitting: Internal Medicine

## 2024-05-08 ENCOUNTER — Observation Stay
Admission: EM | Admit: 2024-05-08 | Discharge: 2024-05-09 | Disposition: A | Discharge status: Home / Self Care | Attending: Internal Medicine | Admitting: Internal Medicine

## 2024-05-08 ENCOUNTER — Ambulatory Visit: Payer: Self-pay

## 2024-05-08 ENCOUNTER — Emergency Department

## 2024-05-08 DIAGNOSIS — E785 Hyperlipidemia, unspecified: Secondary | ICD-10-CM | POA: Diagnosis present

## 2024-05-08 DIAGNOSIS — I959 Hypotension, unspecified: Principal | ICD-10-CM | POA: Diagnosis present

## 2024-05-08 DIAGNOSIS — I480 Paroxysmal atrial fibrillation: Secondary | ICD-10-CM | POA: Diagnosis not present

## 2024-05-08 DIAGNOSIS — I10 Essential (primary) hypertension: Secondary | ICD-10-CM | POA: Diagnosis present

## 2024-05-08 DIAGNOSIS — Z7984 Long term (current) use of oral hypoglycemic drugs: Secondary | ICD-10-CM | POA: Diagnosis not present

## 2024-05-08 DIAGNOSIS — F419 Anxiety disorder, unspecified: Secondary | ICD-10-CM | POA: Diagnosis present

## 2024-05-08 DIAGNOSIS — F0280 Dementia in other diseases classified elsewhere without behavioral disturbance: Secondary | ICD-10-CM | POA: Diagnosis present

## 2024-05-08 DIAGNOSIS — E1142 Type 2 diabetes mellitus with diabetic polyneuropathy: Secondary | ICD-10-CM | POA: Diagnosis not present

## 2024-05-08 DIAGNOSIS — E538 Deficiency of other specified B group vitamins: Secondary | ICD-10-CM | POA: Diagnosis not present

## 2024-05-08 DIAGNOSIS — E1169 Type 2 diabetes mellitus with other specified complication: Secondary | ICD-10-CM | POA: Diagnosis present

## 2024-05-08 DIAGNOSIS — E039 Hypothyroidism, unspecified: Secondary | ICD-10-CM | POA: Diagnosis not present

## 2024-05-08 DIAGNOSIS — I4891 Unspecified atrial fibrillation: Secondary | ICD-10-CM | POA: Diagnosis present

## 2024-05-08 DIAGNOSIS — K219 Gastro-esophageal reflux disease without esophagitis: Secondary | ICD-10-CM | POA: Diagnosis not present

## 2024-05-08 DIAGNOSIS — Z79899 Other long term (current) drug therapy: Secondary | ICD-10-CM | POA: Diagnosis not present

## 2024-05-08 DIAGNOSIS — I509 Heart failure, unspecified: Secondary | ICD-10-CM | POA: Insufficient documentation

## 2024-05-08 DIAGNOSIS — I952 Hypotension due to drugs: Secondary | ICD-10-CM | POA: Diagnosis not present

## 2024-05-08 DIAGNOSIS — I482 Chronic atrial fibrillation, unspecified: Secondary | ICD-10-CM | POA: Diagnosis present

## 2024-05-08 DIAGNOSIS — I739 Peripheral vascular disease, unspecified: Secondary | ICD-10-CM | POA: Diagnosis present

## 2024-05-08 DIAGNOSIS — I2510 Atherosclerotic heart disease of native coronary artery without angina pectoris: Secondary | ICD-10-CM | POA: Diagnosis not present

## 2024-05-08 DIAGNOSIS — I110 Hypertensive heart disease with heart failure: Secondary | ICD-10-CM | POA: Insufficient documentation

## 2024-05-08 DIAGNOSIS — F320 Major depressive disorder, single episode, mild: Secondary | ICD-10-CM | POA: Diagnosis present

## 2024-05-08 DIAGNOSIS — G4700 Insomnia, unspecified: Secondary | ICD-10-CM | POA: Diagnosis present

## 2024-05-08 DIAGNOSIS — G301 Alzheimer's disease with late onset: Secondary | ICD-10-CM | POA: Diagnosis not present

## 2024-05-08 DIAGNOSIS — G4733 Obstructive sleep apnea (adult) (pediatric): Secondary | ICD-10-CM | POA: Diagnosis present

## 2024-05-08 LAB — CBC WITH DIFFERENTIAL/PLATELET
Abs Immature Granulocytes: 0.03 10*3/uL (ref 0.00–0.07)
Basophils Absolute: 0.1 10*3/uL (ref 0.0–0.1)
Basophils Relative: 1 %
Eosinophils Absolute: 0.1 10*3/uL (ref 0.0–0.5)
Eosinophils Relative: 1 %
HCT: 33.2 % — ABNORMAL LOW (ref 36.0–46.0)
Hemoglobin: 10.6 g/dL — ABNORMAL LOW (ref 12.0–15.0)
Immature Granulocytes: 0 %
Lymphocytes Relative: 26 %
Lymphs Abs: 2.3 10*3/uL (ref 0.7–4.0)
MCH: 26.5 pg (ref 26.0–34.0)
MCHC: 31.9 g/dL (ref 30.0–36.0)
MCV: 83 fL (ref 80.0–100.0)
Monocytes Absolute: 0.8 10*3/uL (ref 0.1–1.0)
Monocytes Relative: 9 %
Neutro Abs: 5.7 10*3/uL (ref 1.7–7.7)
Neutrophils Relative %: 63 %
Platelets: 317 10*3/uL (ref 150–400)
RBC: 4 MIL/uL (ref 3.87–5.11)
RDW: 15.1 % (ref 11.5–15.5)
WBC: 9.1 10*3/uL (ref 4.0–10.5)
nRBC: 0 % (ref 0.0–0.2)

## 2024-05-08 LAB — COMPREHENSIVE METABOLIC PANEL WITH GFR
ALT: 20 U/L (ref 0–44)
AST: 24 U/L (ref 15–41)
Albumin: 3.4 g/dL — ABNORMAL LOW (ref 3.5–5.0)
Alkaline Phosphatase: 63 U/L (ref 38–126)
Anion gap: 11 (ref 5–15)
BUN: 21 mg/dL (ref 8–23)
CO2: 23 mmol/L (ref 22–32)
Calcium: 8 mg/dL — ABNORMAL LOW (ref 8.9–10.3)
Chloride: 106 mmol/L (ref 98–111)
Creatinine, Ser: 0.91 mg/dL (ref 0.44–1.00)
GFR, Estimated: >60 mL/min (ref >60)
Glucose, Bld: 111 mg/dL — ABNORMAL HIGH (ref 70–99)
Potassium: 3.3 mmol/L — ABNORMAL LOW (ref 3.5–5.1)
Sodium: 140 mmol/L (ref 135–145)
Total Bilirubin: 0.6 mg/dL (ref 0.0–1.2)
Total Protein: 5.8 g/dL — ABNORMAL LOW (ref 6.5–8.1)

## 2024-05-08 LAB — URINALYSIS, ROUTINE W REFLEX MICROSCOPIC
Bilirubin Urine: NEGATIVE
Glucose, UA: NEGATIVE mg/dL
Hgb urine dipstick: NEGATIVE
Ketones, ur: NEGATIVE mg/dL
Leukocytes,Ua: NEGATIVE
Nitrite: NEGATIVE
Protein, ur: NEGATIVE mg/dL
Specific Gravity, Urine: 1.008 (ref 1.005–1.030)
pH: 7 (ref 5.0–8.0)

## 2024-05-08 LAB — MAGNESIUM: Magnesium: 1.8 mg/dL (ref 1.7–2.4)

## 2024-05-08 LAB — TROPONIN I (HIGH SENSITIVITY)
Troponin I (High Sensitivity): 7 ng/L (ref <18)
Troponin I (High Sensitivity): 7 ng/L (ref <18)

## 2024-05-08 MED ORDER — NITROGLYCERIN 0.4 MG SL SUBL: 0.4000 mg | SUBLINGUAL_TABLET | SUBLINGUAL | Status: DC | PRN

## 2024-05-08 MED ORDER — MELATONIN 5 MG PO TABS
5.0000 mg | ORAL_TABLET | Freq: Every evening | ORAL | Status: DC | PRN
  Administered 2024-05-08: 5 mg via ORAL
  Filled 2024-05-08: qty 1

## 2024-05-08 MED ORDER — APIXABAN 5 MG PO TABS
5.0000 mg | ORAL_TABLET | Freq: Two times a day (BID) | ORAL | Status: DC
  Administered 2024-05-08 – 2024-05-09 (×2): 5 mg via ORAL
  Filled 2024-05-08 (×2): qty 1

## 2024-05-08 MED ORDER — SENNOSIDES-DOCUSATE SODIUM 8.6-50 MG PO TABS: 1.0000 | ORAL_TABLET | Freq: Every evening | ORAL | Status: DC | PRN

## 2024-05-08 MED ORDER — ONDANSETRON HCL 4 MG PO TABS
4.0000 mg | ORAL_TABLET | Freq: Four times a day (QID) | ORAL | Status: DC | PRN
  Administered 2024-05-09: 4 mg via ORAL
  Filled 2024-05-08: qty 1

## 2024-05-08 MED ORDER — PRAVASTATIN SODIUM 20 MG PO TABS
20.0000 mg | ORAL_TABLET | Freq: Every day | ORAL | Status: DC
  Administered 2024-05-08: 20 mg via ORAL
  Filled 2024-05-08: qty 1

## 2024-05-08 MED ORDER — SODIUM CHLORIDE 0.9 % IV SOLN: INTRAVENOUS | Status: DC

## 2024-05-08 MED ORDER — ACETAMINOPHEN 650 MG RE SUPP: 650.0000 mg | Freq: Four times a day (QID) | RECTAL | Status: DC | PRN

## 2024-05-08 MED ORDER — LACTATED RINGERS IV BOLUS
1000.0000 mL | Freq: Once | INTRAVENOUS | Status: AC
  Administered 2024-05-08: 1000 mL via INTRAVENOUS

## 2024-05-08 MED ORDER — DONEPEZIL HCL 5 MG PO TABS
10.0000 mg | ORAL_TABLET | Freq: Every day | ORAL | Status: DC
  Administered 2024-05-08: 10 mg via ORAL
  Filled 2024-05-08: qty 2

## 2024-05-08 MED ORDER — ALBUTEROL SULFATE (2.5 MG/3ML) 0.083% IN NEBU: 2.5000 mg | INHALATION_SOLUTION | Freq: Four times a day (QID) | RESPIRATORY_TRACT | Status: DC | PRN

## 2024-05-08 MED ORDER — LEVOTHYROXINE SODIUM 88 MCG PO TABS
88.0000 ug | ORAL_TABLET | Freq: Every day | ORAL | Status: DC
  Administered 2024-05-09: 88 ug via ORAL
  Filled 2024-05-08: qty 1

## 2024-05-08 MED ORDER — ONDANSETRON HCL 4 MG/2ML IJ SOLN: 4.0000 mg | Freq: Four times a day (QID) | INTRAMUSCULAR | Status: DC | PRN

## 2024-05-08 MED ORDER — ACETAMINOPHEN 325 MG PO TABS
650.0000 mg | ORAL_TABLET | Freq: Four times a day (QID) | ORAL | Status: DC | PRN
  Administered 2024-05-09: 650 mg via ORAL
  Filled 2024-05-08: qty 2

## 2024-05-08 MED ORDER — POTASSIUM CHLORIDE CRYS ER 20 MEQ PO TBCR
40.0000 meq | EXTENDED_RELEASE_TABLET | Freq: Once | ORAL | Status: AC
  Administered 2024-05-08: 40 meq via ORAL
  Filled 2024-05-08: qty 2

## 2024-05-08 MED ORDER — VENLAFAXINE HCL ER 75 MG PO CP24
75.0000 mg | ORAL_CAPSULE | Freq: Every day | ORAL | Status: DC
  Administered 2024-05-09: 75 mg via ORAL
  Filled 2024-05-08 (×2): qty 1

## 2024-05-08 MED ORDER — METOPROLOL TARTRATE 5 MG/5ML IV SOLN: 5.0000 mg | INTRAVENOUS | Status: DC | PRN

## 2024-05-08 NOTE — Assessment & Plan Note (Signed)
-   Pravastatin 20 mg nightly resumed 

## 2024-05-08 NOTE — Assessment & Plan Note (Addendum)
 Eliquis  5 mg p.o. twice daily resumed Home metoprolol , amiodarone , diltiazem  will not be resumed on admission due to hypotension Metoprolol  5 mg IV every 4 hours as needed for heart rate greater than 120, 4 doses ordered on admission

## 2024-05-08 NOTE — Assessment & Plan Note (Signed)
-   Levothyroxine 88 mcg daily resumed 

## 2024-05-08 NOTE — Assessment & Plan Note (Signed)
-   Venlafaxine 75 mg daily resumed

## 2024-05-08 NOTE — ED Triage Notes (Signed)
 Pt BIB [facility] from [place] c/o hypotensive pressures. Pt c/o dizziness and weakness. EMS had a pressure of 89/53 on scene. Pt states she recently had a change in B/P medications.  89/53 71 HR 120 CBG  98% RA

## 2024-05-08 NOTE — Assessment & Plan Note (Signed)
 Last B12 value was 325 on 04/09/2024

## 2024-05-08 NOTE — Assessment & Plan Note (Signed)
 Home metoprolol , amiodarone , diltiazem  will not be resumed on admission due to hypotension Metoprolol  5 mg IV every 4 hours as needed for heart rate greater than 120, 4 doses ordered on admission Status post LR 2 L bolus per EDP Sodium chloride  infusion at 150 mL/h, 1 day ordered on admission Maintain goal MAP > 65

## 2024-05-08 NOTE — Assessment & Plan Note (Signed)
 As needed melatonin for sleep ordered

## 2024-05-08 NOTE — ED Provider Notes (Signed)
 [facility] Provider Note    Event Date/Time   First MD Initiated Contact with Patient 05/08/24 1159     (approximate)   History   Chief Complaint Hypotension   HPI  Alison Richardson is a 89 y.o. female with past medical history of hypertension, diabetes, atrial fibrillation on Eliquis , CHF, PAD, and dementia who presents to the ED complaining of hypotension.  Patient reports that she was seen by her PCP 3 days ago, found to have low BP at that time and one of her blood pressure medications was stopped.  She states that she felt better the next day, but then began to feel dizzy and lightheaded yesterday.  Since getting up this morning, she has felt lightheaded whenever she goes to stand up.  She feels like she is going to pass out when she stands up but denies any pain in her chest or difficulty breathing.  She has not had any fevers, cough, difficulty breathing, nausea, vomiting, diarrhea, or dysuria.     Physical Exam   Triage Vital Signs: ED Triage Vitals [05/08/24 1204]  Encounter Vitals Group     BP      Systolic BP Percentile      Diastolic BP Percentile      Pulse      Resp      Temp      Temp src      SpO2 98 %     Weight      Height      Head Circumference      Peak Flow      Pain Score      Pain Loc      Pain Education      Exclude from Growth Chart     Most recent vital signs: Vitals:   05/08/24 1204  BP: (!) 101/59  Pulse: 73  Temp: 97.9 F (36.6 C)  SpO2: 98%    Constitutional: Alert and oriented. Eyes: Conjunctivae are normal. Head: Atraumatic. Nose: No congestion/rhinnorhea. Mouth/Throat: Mucous membranes are moist.  Cardiovascular: Normal rate, regular rhythm. Grossly normal heart sounds.  2+ radial pulses bilaterally. Respiratory: Normal respiratory effort.  No retractions. Lungs CTAB. Gastrointestinal: Soft and nontender. No distention. Musculoskeletal: No lower extremity tenderness nor edema.  Neurologic:  Normal  speech and language. No gross focal neurologic deficits are appreciated.    ED Results / Procedures / Treatments   Labs (all labs ordered are listed, but only abnormal results are displayed) Labs Reviewed  CBC WITH DIFFERENTIAL/PLATELET - Abnormal; Notable for the following components:      Result Value   Hemoglobin 10.6 (*)    HCT 33.2 (*)    All other components within normal limits  COMPREHENSIVE METABOLIC PANEL WITH GFR - Abnormal; Notable for the following components:   Potassium 3.3 (*)    Glucose, Bld 111 (*)    Calcium  8.0 (*)    Total Protein 5.8 (*)    Albumin 3.4 (*)    All other components within normal limits  MAGNESIUM   URINALYSIS, ROUTINE W REFLEX MICROSCOPIC  TROPONIN I (HIGH SENSITIVITY)  TROPONIN I (HIGH SENSITIVITY)     EKG  ED ECG REPORT I, Twilla Galea, the attending physician, personally viewed and interpreted this ECG.   Date: 05/08/2024  EKG Time: 12:09  Rate: 74  Rhythm: atrial fibrillation  Axis: Normal  Intervals:left anterior fascicular block  ST&T Change: None  RADIOLOGY Chest x-ray reviewed and interpreted by me with no infiltrate, edema, or  effusion.  PROCEDURES:  Critical Care performed: No  Procedures   MEDICATIONS ORDERED IN ED: Medications  lactated ringers  bolus 1,000 mL (0 mLs Intravenous Stopped 05/08/24 1423)  lactated ringers  bolus 1,000 mL (1,000 mLs Intravenous New Bag/Given 05/08/24 1423)     IMPRESSION / MDM / ASSESSMENT AND PLAN / ED COURSE  I reviewed the triage vital signs and the nursing notes.                              89 y.o. female with past medical history of hypertension, diabetes, CHF, atrial fibrillation on Eliquis , PAD, and dementia who presents to the ED with lightheadedness and dizziness when standing with low BP.  Patient's presentation is most consistent with acute presentation with potential threat to life or bodily function.  Differential diagnosis includes, but is not limited to, ACS,  arrhythmia, dehydration, electrolyte abnormality, AKI, sepsis, medication effect.  Patient nontoxic-appearing and in no acute distress, vital signs remarkable for borderline hypotension at 101/59, but otherwise reassuring.  No symptoms to suggest infectious process or sepsis, will screen EKG and labs, hydrate with IV fluids.  EKG shows atrial fibrillation with no ischemic changes, patient does have persistent atrial fibrillation per cardiology notes and rate is well-controlled at this time.  Labs without significant anemia, leukocytosis, electrolyte abnormality, or AKI.  No findings concerning for infection on chest x-ray, urinalysis pending at this time.  Troponin within normal limits and I have low suspicion for cardiac etiology.  Her MAPs have typically been in the low 70s with no significant change following 1 L of IV fluids, will give additional IV fluid bolus.  She would benefit from further observation and titrating down of her medications, including diltiazem  and metoprolol .  Case discussed with hospitalist for admission.      FINAL CLINICAL IMPRESSION(S) / ED DIAGNOSES   Final diagnoses:  Hypotension, unspecified hypotension type     Rx / DC Orders   ED Discharge Orders     None        Note:  This document was prepared using Dragon voice recognition software and may include unintentional dictation errors.   Twilla Galea, MD 05/08/24 1444

## 2024-05-08 NOTE — Hospital Course (Signed)
 Mr. Newborn is a 89 year old female with history of moderate sleep apnea, dementia, hypertension, non-insulin -dependent diabetes mellitus, CHF, atrial fibrillation, PAD, who presents emergency department for chief concerns of dizziness and low blood pressure.  Vitals in the ED showed temperature of 97.9, respiration rate 16, heart rate 73, blood pressure 101/59, SpO2 198% on room air.  Serum sodium is 140, potassium 3.3, chloride 106, bicarb 23, BUN of 21, serum creatinine of 0.91, EGFR greater than 60, nonfasting blood close 9.1, hemoglobin 10.6, platelets of 317.  High sensitive troponin is 7.  Magnesium  level is 1.8.  ED treatment: LR 2 L bolus

## 2024-05-08 NOTE — Telephone Encounter (Signed)
  Chief Complaint: low blood pressure Symptoms: blood pressure 85/50, dizzy, weak, feeling bad Frequency: today Pertinent Negatives: Patient denies  Disposition: [x] ED /[] Urgent Care (no appt availability in office) / [] Appointment(In office/virtual)/ []  [facility]/ [] Home Care/ [] Refused Recommended Disposition /[] [facility]/ []  Follow-up with PCP Additional Notes: EMS/911 activated during phone call for transport to ED. Patient disconnected phone call when EMS disconnected.   Reason for Disposition  [1] Systolic BP < 90 AND [2] dizzy, lightheaded, or weak  Answer Assessment - Initial Assessment Questions 1. BLOOD PRESSURE: "What is the blood pressure?" "Did you take at least two measurements 5 minutes apart?"     100/90 2. ONSET: "When did you take your blood pressure?"     1045 3. HOW: "How did you obtain the blood pressure?" (e.g., visiting nurse, automatic home BP monitor)     Automatic cuff 4. HISTORY: "Do you have a history of low blood pressure?" "What is your blood pressure normally?"     yes 5. MEDICINES: "Are you taking any medications for blood pressure?" If Yes, ask: "Have they been changed recently?"     *No Answer* 6. PULSE RATE: "Do you know what your pulse rate is?"      *No Answer* 7. OTHER SYMPTOMS: "Have you been sick recently?" "Have you had a recent injury?"     *No Answer*  Protocols used: Blood Pressure - Low-A-AH

## 2024-05-08 NOTE — H&P (Signed)
 History and Physical   Alison Richardson ZOX:[id number] DOB: 07-Mar-1933 DOA: 05/08/2024  PCP: Hadassah Letters, MD  Patient coming from: [facility] via EMS  I have personally briefly reviewed patient's old medical records in [facility] EMR.  Chief Concern: Hypotension  HPI: Alison Richardson is a 89 year old female with history of moderate sleep apnea, dementia, hypertension, non-insulin -dependent diabetes mellitus, CHF, atrial fibrillation, PAD, who presents emergency department for chief concerns of dizziness and low blood pressure.  Vitals in the ED showed temperature of 97.9, respiration rate 16, heart rate 73, blood pressure 101/59, SpO2 198% on room air.  Serum sodium is 140, potassium 3.3, chloride 106, bicarb 23, BUN of 21, serum creatinine of 0.91, EGFR greater than 60, nonfasting blood close 9.1, hemoglobin 10.6, platelets of 317.  High sensitive troponin is 7.  Magnesium  level is 1.8.  ED treatment: LR 2 L bolus -------------------------------------- At bedside, patient was able to tell me her first and last name, age, location, current calendar year.  She reports that 3 days ago she noticed that her blood pressure was low and that she came to the her PCP.  Her PCP discontinued the valsartan  medication which she did.  She is still taking the diltiazem  and metoprolol  as prescribed.  She reports 1 day after she stopped the losartan, she did feel better however today she started getting dizzy and weak again and her blood pressure is low again.  She denies changes to her appetite, weight loss, dysuria, hematuria, nausea, vomiting, diarrhea.  She reports she is drinking water and eating as appropriate.  She reports she is not taking extra medications.  Social history: She currently lives at home on her own.  She denies tobacco, EtOH, recreational drug use.  She is retired.  ROS: Constitutional: no weight change, no fever ENT/Mouth: no sore throat, no rhinorrhea Eyes: no eye pain, no  vision changes Cardiovascular: no chest pain, no dyspnea,  no edema, no palpitations Respiratory: no cough, no sputum, no wheezing Gastrointestinal: no nausea, no vomiting, no diarrhea, no constipation Genitourinary: no urinary incontinence, no dysuria, no hematuria Musculoskeletal: no arthralgias, no myalgias Skin: no skin lesions, no pruritus, Neuro: + weakness, no loss of consciousness, no syncope Psych: no anxiety, no depression, + decrease appetite Heme/Lymph: no bruising, no bleeding  ED Course: Discussed with EDP, patient requiring hospitalization for chief concerns of low blood pressure.  Assessment/Plan  Principal Problem:   Hypotension Active Problems:   Type 2 diabetes mellitus with peripheral neuropathy (HCC)   Paroxysmal atrial fibrillation (HCC)   Hypothyroidism   CAD (coronary artery disease)   Anxiety disorder   Depression, major, single episode, mild (HCC)   Vitamin B12 deficiency   Insomnia   Hyperlipidemia associated with type 2 diabetes mellitus (HCC)   PAD (peripheral artery disease) (HCC)   Late onset Alzheimer's disease without behavioral disturbance (HCC)   Obstructive sleep apnea   Gastroesophageal reflux disease   Atrial fibrillation and flutter (HCC)   Essential hypertension   Dyslipidemia   Assessment and Plan:  * Hypotension Home metoprolol , amiodarone , diltiazem  will not be resumed on admission due to hypotension Metoprolol  5 mg IV every 4 hours as needed for heart rate greater than 120, 4 doses ordered on admission Status post LR 2 L bolus per EDP Sodium chloride  infusion at 150 mL/h, 1 day ordered on admission Maintain goal MAP > 65  Paroxysmal atrial fibrillation (HCC) Eliquis  5 mg p.o. twice daily resumed Home metoprolol , amiodarone , diltiazem  will not be resumed on admission  due to hypotension Metoprolol  5 mg IV every 4 hours as needed for heart rate greater than 120, 4 doses ordered on admission  Hypothyroidism Levothyroxine  88 mcg  daily resumed  Dyslipidemia Pravastatin  20 mg nightly resumed  Insomnia As needed melatonin for sleep ordered  Vitamin B12 deficiency Last B12 value was 325 on 04/09/2024  Depression, major, single episode, mild (HCC) Venlafaxine  75 mg daily resumed  Chart reviewed.   DVT prophylaxis: Eliquis  5 mg p.o. twice daily Code Status: DNR/DNI Diet: Regular diet Family Communication: Attempted to call Alison Richardson, patient's daughter at [phone] there was no pickup.  Voicemail did not indicate a name and due to HIPAA protection I did not leave a voicemail. Disposition Plan: Pending clinical course Consults called: None at this time Admission status: Telemetry cardiac, observation  Past Medical History:  Diagnosis Date   Acute on chronic congestive heart failure (HCC) 10/07/2020   Arrhythmia    Atrial flutter (HCC) 01/2018   new onset    Basal cell carcinoma of back    Basal cell carcinoma of lip    Cerebral aneurysm    followed by [facility]   CHF (congestive heart failure) (HCC)    DDD (degenerative disc disease), lumbar    superior plate depression, L3 08/18/2014   Diabetes mellitus type II, controlled (HCC)    Diverticulosis    Dysrhythmia    Paroxysmal Supraventricular Tachycardia   Dysthymia    depression   GERD (gastroesophageal reflux disease)    History of meniscal tear    Humeral surgical neck fracture 05/16/2022   Hyperlipidemia    Hypertension    Hypokalemia 12/12/2022   Hypothyroidism    Late onset Alzheimer's disease with behavioral disturbance (HCC)    Leukocytosis 10/07/2020   Mild pulmonary hypertension (HCC)    Multifocal pneumonia 12/09/2022   Overactive bladder    Peripheral vascular disease (HCC)    Puncture wound of forehead 06/11/2023   Seasonal allergic rhinitis    Severe sepsis (HCC) 02/07/2022   Sleep apnea    Past Surgical History:  Procedure Laterality Date   APPENDECTOMY  1946   BASAL CELL CARCINOMA EXCISION     2006 and 2009 removed  from back and lip   CARDIOVASCULAR STRESS TEST  2015   nuclear cardiac stress test negative for ischemia - Dr. Adelina Adu   CATARACT EXTRACTION, BILATERAL  2006   COLONOSCOPY  2014   COLONOSCOPY N/A 08/19/2020   Procedure: COLONOSCOPY;  Surgeon: Shane Darling, MD;  Location: [facility] ENDOSCOPY;  Service: Endoscopy;  Laterality: N/A;   ECTOPIC PREGNANCY SURGERY  1957   ESOPHAGOGASTRODUODENOSCOPY N/A 08/19/2020   Procedure: ESOPHAGOGASTRODUODENOSCOPY (EGD);  Surgeon: Shane Darling, MD;  Location: [facility] ENDOSCOPY;  Service: Endoscopy;  Laterality: N/A;   INJECTION KNEE Left 05/2015   LEFT HEART CATH AND CORONARY ANGIOGRAPHY N/A 10/12/2021   Procedure: LEFT HEART CATH AND CORONARY ANGIOGRAPHY;  Surgeon: Michelle Aid, MD;  Location: [facility] INVASIVE CV LAB;  Service: Cardiovascular;  Laterality: N/A;   REPLACEMENT TOTAL KNEE Left 09/06/2016   Dr. Kita Perish   TONSILLECTOMY AND ADENOIDECTOMY  1953   TOTAL ABDOMINAL HYSTERECTOMY  1986   [facility] B   Social History:  reports that she has never smoked. She has never used smokeless tobacco. She reports that she does not currently use alcohol. She reports that she does not use drugs.  Allergies  Allergen Reactions   Pantoprazole  Nausea And Vomiting   Metformin And Related Diarrhea   Family History  Problem Relation Age  of Onset   Hypertension Mother    Heart disease Father    CAD Father    Heart disease Sister    Diabetes Sister    Diabetes Paternal Uncle    Sleep apnea Son    Family history: Family history reviewed and not pertinent  Prior to Admission medications   Medication Sig Start Date End Date Taking? Authorizing Provider  albuterol  (PROVENTIL ) (2.5 MG/3ML) 0.083% nebulizer solution Take 3 mLs (2.5 mg total) by nebulization every 6 (six) hours as needed for wheezing or shortness of breath. 04/03/24   Aileen Alexanders, NP  amiodarone  (PACERONE ) 200 MG tablet Take 1 tablet (200 mg total) by mouth daily. 12/09/23   Gollan, Timothy J, MD   apixaban  (ELIQUIS ) 5 MG TABS tablet Take 1 tablet (5 mg total) by mouth 2 (two) times daily. 12/09/23   Gollan, Timothy J, MD  diltiazem  (CARDIZEM  CD) 240 MG 24 hr capsule Take 240 mg by mouth daily. 04/22/24   [provider]  donepezil  (ARICEPT ) 10 MG tablet TAKE ONE TABLET (10 MG) BY MOUTH AT BEDTIME 04/01/24   Hadassah Letters, MD  gabapentin  (NEURONTIN ) 100 MG capsule Take 1 capsule (100 mg total) by mouth at bedtime. 09/10/23   Hadassah Letters, MD  glipiZIDE  (GLUCOTROL  XL) 5 MG 24 hr tablet TAKE ONE TABLET (5 MG) BY MOUTH ONCE DAILY Patient taking differently: Take 5 mg by mouth daily with breakfast. 03/06/24   Hadassah Letters, MD  levothyroxine  (SYNTHROID ) 88 MCG tablet TAKE 1 TABLET EVERY DAY ON EMPTY STOMACHWITH A GLASS OF WATER AT LEAST 30-60 MINBEFORE BREAKFAST 10/31/23   Hadassah Letters, MD  metoprolol  succinate (TOPROL -XL) 50 MG 24 hr tablet Take 1 tablet (50 mg total) by mouth daily. Take with or immediately following a meal. 12/09/23   Gollan, Timothy J, MD  nitroGLYCERIN  (NITROSTAT ) 0.4 MG SL tablet Place 1 tablet (0.4 mg total) under the tongue every 5 (five) minutes as needed for chest pain. 12/09/23   Gollan, Timothy J, MD  pravastatin  (PRAVACHOL ) 20 MG tablet Take 1 tablet (20 mg total) by mouth at bedtime. 12/09/23   Gollan, Timothy J, MD  Semaglutide,0.25 or 0.5MG /DOS, (OZEMPIC, 0.25 OR 0.5 MG/DOSE,) 2 MG/1.5ML SOPN Inject 0.5 mg into the skin once a week. 05/05/24   Aileen Alexanders, NP  Tiotropium Bromide Monohydrate  (SPIRIVA  RESPIMAT) 1.25 MCG/ACT AERS Inhale into the lungs.    [provider]  torsemide  (DEMADEX ) 20 MG tablet Take 1 tablet (20 mg total) by mouth daily. 12/09/23   Gollan, Timothy J, MD  venlafaxine  XR (EFFEXOR -XR) 75 MG 24 hr capsule TAKE 1 CAPSULE BY MOUTH ONCE DAILY. 02/06/24   Hadassah Letters, MD    Physical Exam: Vitals:   05/08/24 1204 05/08/24 1205  BP: (!) 101/59   Pulse: 73   Temp: 97.9 F (36.6 C)   TempSrc: Oral   SpO2: 98%    Weight:  74.8 kg  Height:  5\' 6"  (1.676 m)   Constitutional: appears age-appropriate, frail Eyes: PERRL, lids and conjunctivae normal ENMT: Mucous membranes are moist. Posterior pharynx clear of any exudate or lesions. Age-appropriate dentition. Hearing appropriate Neck: normal, supple, no masses, no thyromegaly Respiratory: clear to auscultation bilaterally, no wheezing, no crackles. Normal respiratory effort. No accessory muscle use.  Cardiovascular: Regular rate and rhythm, no murmurs / rubs / gallops. No extremity edema. 2+ pedal pulses. No carotid bruits.  Abdomen: Obese abdomen, no tenderness, no masses palpated, no hepatosplenomegaly. Bowel sounds positive.  Musculoskeletal: no clubbing /  cyanosis. No joint deformity upper and lower extremities. Good ROM, no contractures, no atrophy. Normal muscle tone.  Skin: no rashes, lesions, ulcers. No induration Neurologic: Sensation intact. Strength 5/5 in all 4.  Psychiatric: Normal judgment and insight. Alert and oriented x 3. Normal mood.   EKG: independently reviewed, showing atrial flutter with rate of 74, QTc 513  Chest x-ray on Admission: I personally reviewed and I agree with radiologist reading as below.  DG Chest 2 View Result Date: 05/08/2024 CLINICAL DATA:  Weakness, dizziness, and hypotension EXAM: CHEST - 2 VIEW COMPARISON:  Chest radiograph dated 03/26/2024 FINDINGS: Normal lung volumes. Bibasilar linear opacities, left greater than right. No pleural effusion or pneumothorax. Similar enlarged cardiomediastinal silhouette. No acute osseous abnormality. IMPRESSION: 1. Bibasilar linear opacities, left greater than right, likely atelectasis. 2. Similar cardiomegaly. Electronically Signed   By: Limin  Xu M.D.   On: 05/08/2024 12:49   Labs on Admission: I have personally reviewed following labs  CBC: Recent Labs  Lab 05/08/24 1210  WBC 9.1  NEUTROABS 5.7  HGB 10.6*  HCT 33.2*  MCV 83.0  PLT 317   Basic Metabolic  Panel: Recent Labs  Lab 05/08/24 1210  NA 140  K 3.3*  CL 106  CO2 23  GLUCOSE 111*  BUN 21  CREATININE 0.91  CALCIUM  8.0*  MG 1.8   GFR: Estimated Creatinine Clearance: 43.3 mL/min (by C-G formula based on SCr of 0.91 mg/dL). Liver Function Tests: Recent Labs  Lab 05/08/24 1210  AST 24  ALT 20  ALKPHOS 63  BILITOT 0.6  PROT 5.8*  ALBUMIN 3.4*   Urine analysis:    Component Value Date/Time   COLORURINE YELLOW (A) 04/20/2023 0909   APPEARANCEUR Clear 09/03/2023 1126   LABSPEC 1.026 04/20/2023 0909   PHURINE 5.0 04/20/2023 0909   GLUCOSEU 3+ (A) 09/03/2023 1126   HGBUR SMALL (A) 04/20/2023 0909   BILIRUBINUR Negative 09/03/2023 1126   KETONESUR NEGATIVE 04/20/2023 0909   PROTEINUR Negative 09/03/2023 1126   PROTEINUR NEGATIVE 04/20/2023 0909   NITRITE Negative 09/03/2023 1126   NITRITE NEGATIVE 04/20/2023 0909   LEUKOCYTESUR Negative 09/03/2023 1126   LEUKOCYTESUR TRACE (A) 04/20/2023 0909   This document was prepared using Dragon Voice Recognition software and may include unintentional dictation errors.  Dr. Reinhold Carbine [facility]  If 7PM-7AM, please contact overnight-coverage provider If 7AM-7PM, please contact day attending provider [email or web address]  05/08/2024, 3:19 PM

## 2024-05-08 NOTE — ED Notes (Signed)
 Pt to XR

## 2024-05-08 NOTE — Telephone Encounter (Signed)
 Please let patient know that she des have moderate sleep apnea.  We will start the process for CPAP supplies.

## 2024-05-09 DIAGNOSIS — E1142 Type 2 diabetes mellitus with diabetic polyneuropathy: Secondary | ICD-10-CM

## 2024-05-09 DIAGNOSIS — I480 Paroxysmal atrial fibrillation: Secondary | ICD-10-CM

## 2024-05-09 DIAGNOSIS — I959 Hypotension, unspecified: Principal | ICD-10-CM

## 2024-05-09 DIAGNOSIS — E039 Hypothyroidism, unspecified: Secondary | ICD-10-CM | POA: Diagnosis not present

## 2024-05-09 LAB — CBC
HCT: 35.3 % — ABNORMAL LOW (ref 36.0–46.0)
Hemoglobin: 11.3 g/dL — ABNORMAL LOW (ref 12.0–15.0)
MCH: 26.6 pg (ref 26.0–34.0)
MCHC: 32 g/dL (ref 30.0–36.0)
MCV: 83.1 fL (ref 80.0–100.0)
Platelets: 324 10*3/uL (ref 150–400)
RBC: 4.25 MIL/uL (ref 3.87–5.11)
RDW: 15.2 % (ref 11.5–15.5)
WBC: 9.2 10*3/uL (ref 4.0–10.5)
nRBC: 0 % (ref 0.0–0.2)

## 2024-05-09 LAB — BASIC METABOLIC PANEL WITH GFR
Anion gap: 8 (ref 5–15)
BUN: 12 mg/dL (ref 8–23)
CO2: 24 mmol/L (ref 22–32)
Calcium: 8.6 mg/dL — ABNORMAL LOW (ref 8.9–10.3)
Chloride: 112 mmol/L — ABNORMAL HIGH (ref 98–111)
Creatinine, Ser: 0.63 mg/dL (ref 0.44–1.00)
GFR, Estimated: >60 mL/min (ref >60)
Glucose, Bld: 104 mg/dL — ABNORMAL HIGH (ref 70–99)
Potassium: 3.4 mmol/L — ABNORMAL LOW (ref 3.5–5.1)
Sodium: 144 mmol/L (ref 135–145)

## 2024-05-09 MED ORDER — GABAPENTIN 100 MG PO CAPS: 100.0000 mg | ORAL_CAPSULE | Freq: Every day | ORAL | Status: DC

## 2024-05-09 MED ORDER — TORSEMIDE 20 MG PO TABS: 20.0000 mg | ORAL_TABLET | Freq: Every day | ORAL | 0 refills | Status: DC | PRN

## 2024-05-09 MED ORDER — METOPROLOL SUCCINATE ER 25 MG PO TB24: 25.0000 mg | ORAL_TABLET | Freq: Every day | ORAL | 0 refills | Status: DC

## 2024-05-09 MED ORDER — METOPROLOL SUCCINATE ER 50 MG PO TB24
25.0000 mg | ORAL_TABLET | Freq: Every day | ORAL | Status: DC
  Administered 2024-05-09: 25 mg via ORAL
  Filled 2024-05-09: qty 1

## 2024-05-09 MED ORDER — AMIODARONE HCL 200 MG PO TABS
200.0000 mg | ORAL_TABLET | Freq: Every day | ORAL | Status: DC
  Administered 2024-05-09: 200 mg via ORAL
  Filled 2024-05-09: qty 1

## 2024-05-09 MED ORDER — METOPROLOL SUCCINATE ER 50 MG PO TB24: 25.0000 mg | ORAL_TABLET | Freq: Every day | ORAL | Status: DC

## 2024-05-09 MED ORDER — POTASSIUM CHLORIDE CRYS ER 20 MEQ PO TBCR: 20.0000 meq | EXTENDED_RELEASE_TABLET | Freq: Every day | ORAL | 0 refills | Status: DC | PRN

## 2024-05-09 NOTE — Progress Notes (Signed)
 PT Cancellation Note  Patient Details Name: Alison Richardson MRN: [id number] DOB: 08-11-1933   Cancelled Treatment:    Reason Eval/Treat Not Completed: PT screened, no needs identified, will sign off (Order received, chart reviewed- per EMT on unit, pt has been up aBM 5x today already, seems to be moving generally well. DC plan in place for after 1400. Will sign off at this time.)   1:36 PM, 05/09/24 Dawn Eth, PT, DPT Physical Therapist - [facility]  [phone] (ASCOM)    Kain Milosevic C 05/09/2024, 1:36 PM

## 2024-05-09 NOTE — ED Notes (Signed)
 Pt family will be here to take the patient home after 2pm today

## 2024-05-09 NOTE — Discharge Summary (Signed)
 Physician Discharge Summary   Patient: Alison Richardson MRN: [id number] DOB: 1933/04/09  Admit date:     05/08/2024  Discharge date: {dischdate:26783}  Discharge Physician: Melvinia Stager   PCP: Hadassah Letters, MD   Recommendations at discharge:  {Tip this will not be part of the note when signed- Example include specific recommendations for outpatient follow-up, pending tests to follow-up on. (Optional):26781}  ***  Discharge Diagnoses: Principal Problem:   Hypotension Active Problems:   Type 2 diabetes mellitus with peripheral neuropathy (HCC)   Paroxysmal atrial fibrillation (HCC)   Hypothyroidism   CAD (coronary artery disease)   Anxiety disorder   Depression, major, single episode, mild (HCC)   Vitamin B12 deficiency   Insomnia   Hyperlipidemia associated with type 2 diabetes mellitus (HCC)   PAD (peripheral artery disease) (HCC)   Late onset Alzheimer's disease without behavioral disturbance (HCC)   Obstructive sleep apnea   Gastroesophageal reflux disease   Atrial fibrillation and flutter (HCC)   Essential hypertension   Dyslipidemia  Resolved Problems:   * No resolved hospital problems. [facility] Hospital Course: Alison Richardson is a 89 year old female with history of moderate sleep apnea, dementia, hypertension, non-insulin -dependent diabetes mellitus, CHF, atrial fibrillation, PAD, who presents emergency department for chief concerns of dizziness and low blood pressure.  Vitals in the ED showed temperature of 97.9, respiration rate 16, heart rate 73, blood pressure 101/59, SpO2 198% on room air.  Serum sodium is 140, potassium 3.3, chloride 106, bicarb 23, BUN of 21, serum creatinine of 0.91, EGFR greater than 60, nonfasting blood close 9.1, hemoglobin 10.6, platelets of 317.  High sensitive troponin is 7.  Magnesium  level is 1.8.  ED treatment: LR 2 L bolus  Assessment and Plan: * Hypotension Home metoprolol , amiodarone , diltiazem  will not be resumed on admission due to  hypotension Metoprolol  5 mg IV every 4 hours as needed for heart rate greater than 120, 4 doses ordered on admission Status post LR 2 L bolus per EDP Sodium chloride  infusion at 150 mL/h, 1 day ordered on admission Maintain goal MAP > 65  Paroxysmal atrial fibrillation (HCC) Eliquis  5 mg p.o. twice daily resumed Home metoprolol , amiodarone , diltiazem  will not be resumed on admission due to hypotension Metoprolol  5 mg IV every 4 hours as needed for heart rate greater than 120, 4 doses ordered on admission  Hypothyroidism Levothyroxine  88 mcg daily resumed  Dyslipidemia Pravastatin  20 mg nightly resumed  Insomnia As needed melatonin for sleep ordered  Vitamin B12 deficiency Last B12 value was 325 on 04/09/2024  Depression, major, single episode, mild (HCC) Venlafaxine  75 mg daily resumed      {Tip this will not be part of the note when signed Body mass index is 26.63 kg/m. , ,  (Optional):26781}  {(NOTE) Pain control PDMP Statment (Optional):26782} Consultants: *** Procedures performed: ***  Disposition: {Plan; Disposition:26390} Diet recommendation:  Discharge Diet Orders (From admission, onward)     Start     Ordered   05/09/24 0000  Diet - low sodium heart healthy        05/09/24 1139           {Diet_Plan:26776} DISCHARGE MEDICATION: Allergies as of 05/09/2024       Reactions   Pantoprazole  Nausea And Vomiting   Metformin And Related Diarrhea        Medication List     STOP taking these medications    diltiazem  240 MG 24 hr capsule Commonly known as: CARDIZEM  CD  TAKE these medications    albuterol  (2.5 MG/3ML) 0.083% nebulizer solution Commonly known as: PROVENTIL  Take 3 mLs (2.5 mg total) by nebulization every 6 (six) hours as needed for wheezing or shortness of breath.   amiodarone  200 MG tablet Commonly known as: PACERONE  Take 1 tablet (200 mg total) by mouth daily.   apixaban  5 MG Tabs tablet Commonly known as: ELIQUIS  Take 1  tablet (5 mg total) by mouth 2 (two) times daily.   donepezil  10 MG tablet Commonly known as: ARICEPT  TAKE ONE TABLET (10 MG) BY MOUTH AT BEDTIME   gabapentin  100 MG capsule Commonly known as: NEURONTIN  Take 1 capsule (100 mg total) by mouth at bedtime.   glipiZIDE  5 MG 24 hr tablet Commonly known as: GLUCOTROL  XL TAKE ONE TABLET (5 MG) BY MOUTH ONCE DAILY What changed: See the new instructions.   levothyroxine  88 MCG tablet Commonly known as: SYNTHROID  TAKE 1 TABLET EVERY DAY ON EMPTY STOMACHWITH A GLASS OF WATER AT LEAST 30-60 MINBEFORE BREAKFAST   metoprolol  succinate 25 MG 24 hr tablet Commonly known as: TOPROL -XL Take 1 tablet (25 mg total) by mouth daily. Take with or immediately following a meal. Start taking on: May 10, 2024 What changed:  medication strength how much to take   nitroGLYCERIN  0.4 MG SL tablet Commonly known as: NITROSTAT  Place 1 tablet (0.4 mg total) under the tongue every 5 (five) minutes as needed for chest pain.   Ozempic  (0.25 or 0.5 MG/DOSE) 2 MG/1.5ML Sopn Generic drug: Semaglutide (0.25 or 0.5MG /DOS) Inject 0.5 mg into the skin once a week.   potassium chloride  SA 20 MEQ tablet Commonly known as: KLOR-CON  M Take 1 tablet (20 mEq total) by mouth daily as needed. Take it on the days you take Torsemide    pravastatin  20 MG tablet Commonly known as: PRAVACHOL  Take 1 tablet (20 mg total) by mouth at bedtime.   Spiriva  Respimat 1.25 MCG/ACT Aers Generic drug: Tiotropium Bromide Monohydrate  Inhale into the lungs.   torsemide  20 MG tablet Commonly known as: DEMADEX  Take 1 tablet (20 mg total) by mouth daily as needed. For leg edema or sob What changed:  when to take this reasons to take this additional instructions   venlafaxine  XR 75 MG 24 hr capsule Commonly known as: EFFEXOR -XR TAKE 1 CAPSULE BY MOUTH ONCE DAILY.        Follow-up Information     Hadassah Letters, MD. Schedule an appointment as soon as possible for a visit in 1  week(s).   Specialty: Family Medicine Contact information: [address] [place] [address] [phone]                Discharge Exam: Alison Richardson Weights   05/08/24 1205  Weight: 74.8 kg   ***  Condition at discharge: {DC Condition:26389}  The results of significant diagnostics from this hospitalization (including imaging, microbiology, ancillary and laboratory) are listed below for reference.   Imaging Studies: DG Chest 2 View Result Date: 05/08/2024 CLINICAL DATA:  Weakness, dizziness, and hypotension EXAM: CHEST - 2 VIEW COMPARISON:  Chest radiograph dated 03/26/2024 FINDINGS: Normal lung volumes. Bibasilar linear opacities, left greater than right. No pleural effusion or pneumothorax. Similar enlarged cardiomediastinal silhouette. No acute osseous abnormality. IMPRESSION: 1. Bibasilar linear opacities, left greater than right, likely atelectasis. 2. Similar cardiomegaly. Electronically Signed   By: Limin  Xu M.D.   On: 05/08/2024 12:49    Microbiology: Results for orders placed or performed during the hospital encounter of 03/26/24  Respiratory (~20 pathogens)  panel by PCR     Status: None   Collection Time: 03/26/24  1:51 PM   Specimen: Nasopharyngeal Swab; Respiratory  Result Value Ref Range Status   Adenovirus NOT DETECTED NOT DETECTED Final   Coronavirus 229E NOT DETECTED NOT DETECTED Final    Comment: (NOTE) The Coronavirus on the Respiratory Panel, DOES NOT test for the novel  Coronavirus (2019 nCoV)    Coronavirus HKU1 NOT DETECTED NOT DETECTED Final   Coronavirus NL63 NOT DETECTED NOT DETECTED Final   Coronavirus OC43 NOT DETECTED NOT DETECTED Final   Metapneumovirus NOT DETECTED NOT DETECTED Final   Rhinovirus / Enterovirus NOT DETECTED NOT DETECTED Final   Influenza A NOT DETECTED NOT DETECTED Final   Influenza B NOT DETECTED NOT DETECTED Final   Parainfluenza Virus 1 NOT DETECTED NOT DETECTED Final   Parainfluenza Virus 2 NOT DETECTED NOT DETECTED Final    Parainfluenza Virus 3 NOT DETECTED NOT DETECTED Final   Parainfluenza Virus 4 NOT DETECTED NOT DETECTED Final   Respiratory Syncytial Virus NOT DETECTED NOT DETECTED Final   Bordetella pertussis NOT DETECTED NOT DETECTED Final   Bordetella Parapertussis NOT DETECTED NOT DETECTED Final   Chlamydophila pneumoniae NOT DETECTED NOT DETECTED Final   Mycoplasma pneumoniae NOT DETECTED NOT DETECTED Final    Comment: Performed at [facility], [address] [place], [place] [address]  Culture, blood (Routine X 2) w Reflex to ID Panel     Status: None   Collection Time: 03/26/24  4:42 PM   Specimen: BLOOD  Result Value Ref Range Status   Specimen Description BLOOD RIGHT ARM  Final   Special Requests   Final    BOTTLES DRAWN AEROBIC AND ANAEROBIC Blood Culture results may not be optimal due to an inadequate volume of blood received in culture bottles   Culture   Final    NO GROWTH 5 DAYS Performed at [facility], [address] [place], [place] [address]    Report Status 03/31/2024 FINAL  Final  Culture, blood (Routine X 2) w Reflex to ID Panel     Status: None   Collection Time: 03/26/24  4:42 PM   Specimen: BLOOD  Result Value Ref Range Status   Specimen Description BLOOD LEFT ARM  Final   Special Requests   Final    BOTTLES DRAWN AEROBIC AND ANAEROBIC Blood Culture results may not be optimal due to an inadequate volume of blood received in culture bottles   Culture   Final    NO GROWTH 5 DAYS Performed at [facility], [address] [place], [place] [address]    Report Status 03/31/2024 FINAL  Final    Labs: CBC: Recent Labs  Lab 05/08/24 1210 05/09/24 0456  WBC 9.1 9.2  NEUTROABS 5.7  --   HGB 10.6* 11.3*  HCT 33.2* 35.3*  MCV 83.0 83.1  PLT 317 324   Basic Metabolic Panel: Recent Labs  Lab 05/08/24 1210 05/09/24 0456  NA 140 144  K 3.3* 3.4*  CL 106 112*  CO2 23 24  GLUCOSE 111* 104*  BUN 21 12  CREATININE 0.91 0.63  CALCIUM  8.0*  8.6*  MG 1.8  --    Liver Function Tests: Recent Labs  Lab 05/08/24 1210  AST 24  ALT 20  ALKPHOS 63  BILITOT 0.6  PROT 5.8*  ALBUMIN 3.4*   CBG: No results for input(s): "GLUCAP" in the last 168 hours.  Discharge time spent: {LESS THAN/GREATER HQIO:96295} 30 minutes.  Signed: Melvinia Stager, MD [facility] 05/09/2024

## 2024-05-09 NOTE — Discharge Instructions (Signed)
Keep log of BP at home for PCP to review

## 2024-05-11 ENCOUNTER — Telehealth: Payer: Self-pay

## 2024-05-11 NOTE — Telephone Encounter (Signed)
 Copied from CRM [id number]. Topic: General - Billing Inquiry >> May 11, 2024  2:50 PM Virgia Griffins wrote: Pharmacist name Bola from [facility] is calling for medication review for pt. Call back number [phone]

## 2024-05-11 NOTE — Telephone Encounter (Signed)
 Called and notified patient of sleep study. Advised patient that we would be ordering her CPAP and that she will be hearing about getting that soon.

## 2024-05-11 NOTE — Transitions of Care (Post Inpatient/ED Visit) (Signed)
 05/11/2024  Name: Alison Richardson MRN: [id number] DOB: [birth year]  Today's TOC FU Call Status: Today's TOC FU Call Status:: Successful TOC FU Call Completed TOC FU Call Complete Date: 05/11/24 Patient's Name and Date of Birth confirmed.  Transition Care Management Follow-up Telephone Call Date of Discharge: 05/09/24 Discharge Facility: [facility]) Type of Discharge: Emergency Department Reason for ED Visit: Other: (hypotension) How have you been since you were released from the hospital?: Better Any questions or concerns?: No  Items Reviewed: Did you receive and understand the discharge instructions provided?: Yes Medications obtained,verified, and reconciled?: Yes (Medications Reviewed) Any new allergies since your discharge?: No Dietary orders reviewed?: Yes Do you have support at home?: No  Medications Reviewed Today: Medications Reviewed Today     Reviewed by Darrall Ellison, LPN (Licensed Practical Nurse) on 05/11/24 at 1420  Med List Status: <None>   Medication Order Taking? Sig Documenting Provider Last Dose Status Informant  albuterol  (PROVENTIL ) (2.5 MG/3ML) 0.083% nebulizer solution [id number] No Take 3 mLs (2.5 mg total) by nebulization every 6 (six) hours as needed for wheezing or shortness of breath. Aileen Alexanders, NP Taking Active Self, Other, Pharmacy Records           Med Note [facility], TIFF[place] A   Fri May 08, 2024  4:39 PM) prn  amiodarone  (PACERONE ) 200 MG tablet [id number] No Take 1 tablet (200 mg total) by mouth daily. Gollan, Timothy J, MD 05/08/2024 Active Self, Other, Pharmacy Records  apixaban  (ELIQUIS ) 5 MG TABS tablet [id number] No Take 1 tablet (5 mg total) by mouth 2 (two) times daily. Gollan, Timothy J, MD 05/08/2024 Morning Active Self, Other, Pharmacy Records  donepezil  (ARICEPT ) 10 MG tablet [id number] No TAKE ONE TABLET (10 MG) BY MOUTH AT BEDTIME Hadassah Letters, MD 05/07/2024 Active Self, Other, Pharmacy Records   gabapentin  (NEURONTIN ) 100 MG capsule [id number] No Take 1 capsule (100 mg total) by mouth at bedtime. Hadassah Letters, MD 05/07/2024 Active Self, Other, Pharmacy Records  glipiZIDE  (GLUCOTROL  XL) 5 MG 24 hr tablet [id number] No TAKE ONE TABLET (5 MG) BY MOUTH ONCE DAILY  Patient taking differently: Take 5 mg by mouth daily with breakfast.   Hadassah Letters, MD 05/08/2024 Active Self, Other, Pharmacy Records  levothyroxine  (SYNTHROID ) 88 MCG tablet [id number] No TAKE 1 TABLET EVERY DAY ON EMPTY STOMACHWITH A GLASS OF WATER AT LEAST 30-60 MINBEFORE BREAKFAST Hadassah Letters, MD 05/08/2024 Active Self, Other, Pharmacy Records  metoprolol  succinate (TOPROL -XL) 25 MG 24 hr tablet [id number]  Take 1 tablet (25 mg total) by mouth daily. Take with or immediately following a meal. Melvinia Stager, MD  Active   nitroGLYCERIN  (NITROSTAT ) 0.4 MG SL tablet [id number] No Place 1 tablet (0.4 mg total) under the tongue every 5 (five) minutes as needed for chest pain. Gollan, Timothy J, MD Taking Active Self, Other, Pharmacy Records           Med Note [facility], [facility] A   Fri May 08, 2024  4:44 PM) prn  potassium chloride  SA (KLOR-CON  M) 20 MEQ tablet [id number]  Take 1 tablet (20 mEq total) by mouth daily as needed. Take it on the days you take Torsemide  Patel, Sona, MD  Active   pravastatin  (PRAVACHOL ) 20 MG tablet [id number] No Take 1 tablet (20 mg total) by mouth at bedtime. Gollan, Timothy J, MD 05/07/2024 Active Self, Other, Pharmacy Records  Semaglutide ,0.25 or 0.5MG /DOS, (OZEMPIC , 0.25 OR 0.5 MG/DOSE,) 2 MG/1.5ML SOPN [id number] No Inject 0.5 mg into the skin  once a week. Aileen Alexanders, NP 05/05/2024 Active Self, Other, Pharmacy Records           Med Note [facility], [place] A   Fri May 08, 2024  4:46 PM) First dose given in office 05/05/24. Currently on the 0.25mg  for the next 3 doses  Tiotropium Bromide Monohydrate  (SPIRIVA  RESPIMAT) 1.25 MCG/ACT AERS [id number] No Inhale into the lungs. [provider] 05/08/2024  Active Self, Other, Pharmacy Records  torsemide  (DEMADEX ) 20 MG tablet [id number]  Take 1 tablet (20 mg total) by mouth daily as needed. For leg edema or sob Patel, Sona, MD  Active   venlafaxine  XR (EFFEXOR -XR) 75 MG 24 hr capsule [id number] No TAKE 1 CAPSULE BY MOUTH ONCE DAILY. Hadassah Letters, MD 05/08/2024 Active Self, Other, Pharmacy Records  Med List Note Blondie Burke, CPhT 05/25/23 1129): Resident at [facility] [phone]            Home Care and Equipment/Supplies: Were Home Health Services Ordered?: NA Any new equipment or medical supplies ordered?: NA  Functional Questionnaire: Do you need assistance with bathing/showering or dressing?: No Do you need assistance with meal preparation?: No Do you need assistance with eating?: No Do you have difficulty maintaining continence: No Do you need assistance with getting out of bed/getting out of a chair/moving?: No Do you have difficulty managing or taking your medications?: No  Follow up appointments reviewed: PCP Follow-up appointment confirmed?: Yes Date of PCP follow-up appointment?: 05/12/24 Follow-up Provider: [facility] Follow-up appointment confirmed?: NA Do you need transportation to your follow-up appointment?: No Do you understand care options if your condition(s) worsen?: Yes-patient verbalized understanding    SIGNATURE Darrall Ellison, LPN [facility] Nurse Health Advisor Direct Dial [phone]

## 2024-05-12 ENCOUNTER — Encounter: Payer: Self-pay | Admitting: Pediatrics

## 2024-05-12 ENCOUNTER — Ambulatory Visit (INDEPENDENT_AMBULATORY_CARE_PROVIDER_SITE_OTHER): Admitting: Pediatrics

## 2024-05-12 VITALS — BP 123/73 | HR 60 | Temp 97.4°F

## 2024-05-12 DIAGNOSIS — I5032 Chronic diastolic (congestive) heart failure: Secondary | ICD-10-CM | POA: Diagnosis not present

## 2024-05-12 DIAGNOSIS — I471 Supraventricular tachycardia, unspecified: Secondary | ICD-10-CM

## 2024-05-12 DIAGNOSIS — R001 Bradycardia, unspecified: Secondary | ICD-10-CM

## 2024-05-12 DIAGNOSIS — E876 Hypokalemia: Secondary | ICD-10-CM

## 2024-05-12 DIAGNOSIS — R0789 Other chest pain: Secondary | ICD-10-CM

## 2024-05-12 DIAGNOSIS — I480 Paroxysmal atrial fibrillation: Secondary | ICD-10-CM

## 2024-05-12 DIAGNOSIS — I152 Hypertension secondary to endocrine disorders: Secondary | ICD-10-CM

## 2024-05-12 DIAGNOSIS — E1159 Type 2 diabetes mellitus with other circulatory complications: Secondary | ICD-10-CM

## 2024-05-12 DIAGNOSIS — E1142 Type 2 diabetes mellitus with diabetic polyneuropathy: Secondary | ICD-10-CM

## 2024-05-12 DIAGNOSIS — G4733 Obstructive sleep apnea (adult) (pediatric): Secondary | ICD-10-CM

## 2024-05-12 MED ORDER — METOPROLOL SUCCINATE ER 25 MG PO TB24
12.5000 mg | ORAL_TABLET | Freq: Every day | ORAL | Status: DC
Start: 2024-05-12 — End: 2024-05-20

## 2024-05-12 NOTE — Assessment & Plan Note (Signed)
 Torsemide  as needed. Plan to repeat labs below.

## 2024-05-12 NOTE — Progress Notes (Signed)
 Office Visit  BP 123/73   Pulse 60   Temp (!) 97.4 F (36.3 C) (Oral)   SpO2 94%    Subjective:    Patient ID: Alison Richardson, female    DOB: Aug 25, 1933, 89 y.o.   MRN: [id number]  HPI: Alison Richardson is a 89 y.o. female  Chief Complaint  Patient presents with   Hospitalization Follow-up    Discussed the use of AI scribe software for clinical note transcription with the patient, who gave verbal consent to proceed.  History of Present Illness   Alison Richardson is an 89 year old female with atrial fibrillation and hypertension who presents with recent episodes of low blood pressure and heart pain.  She has experienced a significant drop in blood pressure, which she felt acutely. This led to the discontinuation of valsartan  and two other medications affecting her blood pressure, though she cannot recall their names. She continues to take amiodarone , Eliquis , and levothyroxine . She was previously taking torsemide  daily but now takes it as needed and has not taken it in over a week.  She has experienced heart pain over the last two days, which is unusual for her. She describes the pain as internal and has used nitroglycerin  on those days, though not today. She carries nitroglycerin  and has used it only once or twice before this recent episode. She feels scared about needing to take nitroglycerin .  She mentions being told she no longer had atrial fibrillation by one doctor, but was informed at the hospital that she still has it. She no longer takes metoprolol .  She is currently on Ozempic  for diabetes and has stopped taking glipizide . She is due for her second injection of Ozempic  today. She is also supposed to receive a sleep machine, which she expected to be delivered at this appointment.  No leg swelling but notes occasional differences in her feet. She sneezes daily, which she describes as typical for her.      Relevant past medical, surgical, family and social history reviewed and  updated as indicated. Interim medical history since our last visit reviewed. Allergies and medications reviewed and updated.  ROS per HPI unless specifically indicated above     Objective:     BP 123/73   Pulse 60   Temp (!) 97.4 F (36.3 C) (Oral)   SpO2 94%   Wt Readings from Last 3 Encounters:  05/08/24 165 lb (74.8 kg)  05/05/24 169 lb 14.4 oz (77.1 kg)  04/13/24 170 lb 9.6 oz (77.4 kg)     Physical Exam Constitutional:      Appearance: Normal appearance.  HENT:     Head: Normocephalic and atraumatic.  Eyes:     Pupils: Pupils are equal, round, and reactive to light.  Cardiovascular:     Rate and Rhythm: Regular rhythm. Bradycardia present.     Pulses: Normal pulses.     Heart sounds: Normal heart sounds.  Pulmonary:     Effort: Pulmonary effort is normal.     Breath sounds: Normal breath sounds.  Abdominal:     General: Abdomen is flat.     Palpations: Abdomen is soft.  Musculoskeletal:        General: Normal range of motion.     Cervical back: Normal range of motion.  Skin:    General: Skin is warm and dry.     Capillary Refill: Capillary refill takes less than 2 seconds.  Neurological:     General: No focal deficit present.  Mental Status: She is alert. Mental status is at baseline.  Psychiatric:        Mood and Affect: Mood normal.        Behavior: Behavior normal.         05/05/2024    1:25 PM 04/02/2024    4:01 PM 04/09/2023   11:04 AM 01/15/2023    3:47 PM 08/21/2022    1:47 PM  Depression screen PHQ 2/9  Decreased Interest 0 -- 0 0 0  Down, Depressed, Hopeless 0  0 0 0  PHQ - 2 Score 0  0 0 0  Altered sleeping 0  0 3   Tired, decreased energy 0  0 3   Change in appetite 0  0 0   Feeling bad or failure about yourself  0  0 0   Trouble concentrating 0  0 0   Moving slowly or fidgety/restless 0  0 0   Suicidal thoughts 0  0 0   PHQ-9 Score 0  0 6   Difficult doing work/chores   Not difficult at all Not difficult at all        05/05/2024     1:25 PM 09/10/2023    5:03 PM 01/15/2023    3:48 PM 08/21/2022    1:48 PM  GAD 7 : Generalized Anxiety Score  Nervous, Anxious, on Edge 0  0 0  Control/stop worrying 0  0 1  Worry too much - different things 0  0 1  Trouble relaxing 0  0 0  Restless 0  0 0  Easily annoyed or irritable 0  0 0  Afraid - awful might happen 0  0 0  Total GAD 7 Score 0  0 2  Anxiety Difficulty  Somewhat difficult Not difficult at all Somewhat difficult       Assessment & Plan:  Assessment & Plan   Hypokalemia Noted on recent blood work, will repeat. -     Basic metabolic panel with GFR  Chronic diastolic CHF (congestive heart failure) (HCC) Assessment & Plan: Torsemide  as needed. Plan to repeat labs below.  Orders: -     Basic metabolic panel with GFR -     Magnesium   Chest discomfort  Intermittent chest pain relieved by nitroglycerin , suspect stress-related due to hypotension. EKG w sinus brady. Will message cards for follow up.   - Continue nitroglycerin  as needed. -     EKG 12-Lead  Bradycardia EKG similar to prior, sinus brady. Plan to lower dose of metop. Reassess in 2 weeks. -     Metoprolol  Succinate ER; Take 0.5 tablets (12.5 mg total) by mouth daily. Take with or immediately following a meal.  Paroxysmal atrial fibrillation (HCC) PSVT (paroxysmal supraventricular tachycardia) (HCC) Assessment & Plan: On amiodarone  and metop, eliquis . Feels fatigued and pretty bradycardic today. EKG similar to prior, sinus brady. Plan to lower dose of metop. Reassess in 2 weeks. Messaged cardiology for f/u recs.  Obstructive sleep apnea Moderate sleep apnea. Has not yet received machine, should arrive to home.  Hypertension associated with diabetes [facility]) Recent ER visit for hypotension. Stopped ARB, may benefit from lower metop as well. CTM.  Type 2 diabetes mellitus with peripheral neuropathy [facility]) Assessment & Plan: Diabetes management includes Ozempic  initiation. Glipizide  discontinued.  Weight loss anticipated with Ozempic . - Continue Ozempic  as prescribed. - Monitor blood glucose levels regularly.   Follow up plan: Return in about 2 weeks (around 05/26/2024) for Chronic illness f/u.  Hadassah Letters, MD  Today's visit encompasses complex care management of above conditions as part of ongoing care as primary care doctor and longitudinal relationship.

## 2024-05-12 NOTE — Assessment & Plan Note (Signed)
 Diabetes management includes Ozempic  initiation. Glipizide  discontinued. Weight loss anticipated with Ozempic . - Continue Ozempic  as prescribed. - Monitor blood glucose levels regularly.

## 2024-05-12 NOTE — Patient Instructions (Addendum)
 Reduce metoprolol  to 12.5mg  (half tab)  When to take torsemide :  If you go up 2-3 lbs over night, take torsemide  and potassium If that happens more than 2 days in a row call us  or your cardiologist  If you gain more than 5lbs in a week, call us 

## 2024-05-12 NOTE — Assessment & Plan Note (Signed)
 On amiodarone  and metop, eliquis . Feels fatigued and pretty bradycardic today. EKG similar to prior, sinus brady. Plan to lower dose of metop. Reassess in 2 weeks. Messaged cardiology for f/u recs.

## 2024-05-13 ENCOUNTER — Telehealth: Payer: Self-pay | Admitting: Pharmacy Technician

## 2024-05-13 ENCOUNTER — Other Ambulatory Visit (HOSPITAL_COMMUNITY): Payer: Self-pay

## 2024-05-13 LAB — BASIC METABOLIC PANEL WITH GFR
BUN/Creatinine Ratio: 12 (ref 12–28)
BUN: 11 mg/dL (ref 8–27)
CO2: 19 mmol/L — ABNORMAL LOW (ref 20–29)
Calcium: 9.3 mg/dL (ref 8.7–10.3)
Chloride: 106 mmol/L (ref 96–106)
Creatinine, Ser: 0.91 mg/dL (ref 0.57–1.00)
Glucose: 169 mg/dL — ABNORMAL HIGH (ref 70–99)
Potassium: 4 mmol/L (ref 3.5–5.2)
Sodium: 144 mmol/L (ref 134–144)
eGFR: 60 mL/min/{1.73_m2} (ref >59)

## 2024-05-13 LAB — MAGNESIUM: Magnesium: 2.1 mg/dL (ref 1.6–2.3)

## 2024-05-13 NOTE — Telephone Encounter (Signed)
   [facility] called [facility] and they said ignore this request. They have this filled now

## 2024-05-13 NOTE — Telephone Encounter (Signed)
 Returned call and medications has been reviewed

## 2024-05-14 ENCOUNTER — Ambulatory Visit: Payer: Self-pay | Admitting: Pediatrics

## 2024-05-14 ENCOUNTER — Inpatient Hospital Stay
Admission: EM | Admit: 2024-05-14 | Discharge: 2024-05-20 | DRG: 321 | Disposition: A | Discharge status: Home Health | Source: Skilled Nursing Facility | Attending: Family Medicine | Admitting: Family Medicine

## 2024-05-14 ENCOUNTER — Telehealth: Payer: Self-pay | Admitting: Cardiovascular Disease

## 2024-05-14 ENCOUNTER — Inpatient Hospital Stay (HOSPITAL_COMMUNITY)
Admit: 2024-05-14 | Discharge: 2024-05-14 | Disposition: A | Discharge status: Home / Self Care | Attending: Internal Medicine | Admitting: Internal Medicine

## 2024-05-14 ENCOUNTER — Other Ambulatory Visit: Payer: Self-pay

## 2024-05-14 ENCOUNTER — Emergency Department

## 2024-05-14 DIAGNOSIS — E039 Hypothyroidism, unspecified: Secondary | ICD-10-CM | POA: Diagnosis present

## 2024-05-14 DIAGNOSIS — R0602 Shortness of breath: Secondary | ICD-10-CM

## 2024-05-14 DIAGNOSIS — F418 Other specified anxiety disorders: Secondary | ICD-10-CM | POA: Diagnosis not present

## 2024-05-14 DIAGNOSIS — D72829 Elevated white blood cell count, unspecified: Secondary | ICD-10-CM | POA: Diagnosis present

## 2024-05-14 DIAGNOSIS — Z8249 Family history of ischemic heart disease and other diseases of the circulatory system: Secondary | ICD-10-CM

## 2024-05-14 DIAGNOSIS — J440 Chronic obstructive pulmonary disease with acute lower respiratory infection: Secondary | ICD-10-CM | POA: Diagnosis present

## 2024-05-14 DIAGNOSIS — Z7901 Long term (current) use of anticoagulants: Secondary | ICD-10-CM

## 2024-05-14 DIAGNOSIS — I214 Non-ST elevation (NSTEMI) myocardial infarction: Principal | ICD-10-CM | POA: Diagnosis present

## 2024-05-14 DIAGNOSIS — E119 Type 2 diabetes mellitus without complications: Secondary | ICD-10-CM

## 2024-05-14 DIAGNOSIS — Z7985 Long-term (current) use of injectable non-insulin antidiabetic drugs: Secondary | ICD-10-CM

## 2024-05-14 DIAGNOSIS — F0284 Dementia in other diseases classified elsewhere, unspecified severity, with anxiety: Secondary | ICD-10-CM | POA: Diagnosis present

## 2024-05-14 DIAGNOSIS — I2510 Atherosclerotic heart disease of native coronary artery without angina pectoris: Secondary | ICD-10-CM | POA: Diagnosis present

## 2024-05-14 DIAGNOSIS — J9811 Atelectasis: Secondary | ICD-10-CM | POA: Diagnosis present

## 2024-05-14 DIAGNOSIS — I5043 Acute on chronic combined systolic (congestive) and diastolic (congestive) heart failure: Secondary | ICD-10-CM | POA: Diagnosis present

## 2024-05-14 DIAGNOSIS — F331 Major depressive disorder, recurrent, moderate: Secondary | ICD-10-CM | POA: Diagnosis present

## 2024-05-14 DIAGNOSIS — G3184 Mild cognitive impairment, so stated: Secondary | ICD-10-CM | POA: Diagnosis present

## 2024-05-14 DIAGNOSIS — E1151 Type 2 diabetes mellitus with diabetic peripheral angiopathy without gangrene: Secondary | ICD-10-CM | POA: Diagnosis present

## 2024-05-14 DIAGNOSIS — I5033 Acute on chronic diastolic (congestive) heart failure: Secondary | ICD-10-CM

## 2024-05-14 DIAGNOSIS — I5041 Acute combined systolic (congestive) and diastolic (congestive) heart failure: Secondary | ICD-10-CM | POA: Diagnosis not present

## 2024-05-14 DIAGNOSIS — I5023 Acute on chronic systolic (congestive) heart failure: Secondary | ICD-10-CM | POA: Diagnosis not present

## 2024-05-14 DIAGNOSIS — Z7982 Long term (current) use of aspirin: Secondary | ICD-10-CM

## 2024-05-14 DIAGNOSIS — F0283 Dementia in other diseases classified elsewhere, unspecified severity, with mood disturbance: Secondary | ICD-10-CM | POA: Diagnosis present

## 2024-05-14 DIAGNOSIS — F32A Depression, unspecified: Secondary | ICD-10-CM | POA: Diagnosis present

## 2024-05-14 DIAGNOSIS — I110 Hypertensive heart disease with heart failure: Secondary | ICD-10-CM | POA: Diagnosis present

## 2024-05-14 DIAGNOSIS — Z515 Encounter for palliative care: Secondary | ICD-10-CM

## 2024-05-14 DIAGNOSIS — K5900 Constipation, unspecified: Secondary | ICD-10-CM | POA: Diagnosis not present

## 2024-05-14 DIAGNOSIS — E1140 Type 2 diabetes mellitus with diabetic neuropathy, unspecified: Secondary | ICD-10-CM | POA: Diagnosis present

## 2024-05-14 DIAGNOSIS — E872 Acidosis, unspecified: Secondary | ICD-10-CM | POA: Insufficient documentation

## 2024-05-14 DIAGNOSIS — I4819 Other persistent atrial fibrillation: Secondary | ICD-10-CM | POA: Diagnosis present

## 2024-05-14 DIAGNOSIS — J9621 Acute and chronic respiratory failure with hypoxia: Secondary | ICD-10-CM | POA: Diagnosis present

## 2024-05-14 DIAGNOSIS — I429 Cardiomyopathy, unspecified: Secondary | ICD-10-CM | POA: Diagnosis present

## 2024-05-14 DIAGNOSIS — G301 Alzheimer's disease with late onset: Secondary | ICD-10-CM | POA: Diagnosis present

## 2024-05-14 DIAGNOSIS — Z66 Do not resuscitate: Secondary | ICD-10-CM | POA: Diagnosis present

## 2024-05-14 DIAGNOSIS — Z833 Family history of diabetes mellitus: Secondary | ICD-10-CM

## 2024-05-14 DIAGNOSIS — I509 Heart failure, unspecified: Secondary | ICD-10-CM

## 2024-05-14 DIAGNOSIS — I480 Paroxysmal atrial fibrillation: Secondary | ICD-10-CM | POA: Diagnosis present

## 2024-05-14 DIAGNOSIS — Z79890 Hormone replacement therapy (postmenopausal): Secondary | ICD-10-CM

## 2024-05-14 DIAGNOSIS — I5032 Chronic diastolic (congestive) heart failure: Secondary | ICD-10-CM

## 2024-05-14 DIAGNOSIS — Z96652 Presence of left artificial knee joint: Secondary | ICD-10-CM | POA: Diagnosis present

## 2024-05-14 DIAGNOSIS — Z79899 Other long term (current) drug therapy: Secondary | ICD-10-CM

## 2024-05-14 DIAGNOSIS — E1122 Type 2 diabetes mellitus with diabetic chronic kidney disease: Secondary | ICD-10-CM | POA: Diagnosis not present

## 2024-05-14 DIAGNOSIS — R7989 Other specified abnormal findings of blood chemistry: Secondary | ICD-10-CM

## 2024-05-14 DIAGNOSIS — Z85828 Personal history of other malignant neoplasm of skin: Secondary | ICD-10-CM

## 2024-05-14 DIAGNOSIS — I252 Old myocardial infarction: Secondary | ICD-10-CM

## 2024-05-14 DIAGNOSIS — I739 Peripheral vascular disease, unspecified: Secondary | ICD-10-CM | POA: Diagnosis present

## 2024-05-14 DIAGNOSIS — I081 Rheumatic disorders of both mitral and tricuspid valves: Secondary | ICD-10-CM | POA: Diagnosis present

## 2024-05-14 DIAGNOSIS — I272 Pulmonary hypertension, unspecified: Secondary | ICD-10-CM | POA: Diagnosis present

## 2024-05-14 DIAGNOSIS — Z90710 Acquired absence of both cervix and uterus: Secondary | ICD-10-CM

## 2024-05-14 DIAGNOSIS — Z9981 Dependence on supplemental oxygen: Secondary | ICD-10-CM

## 2024-05-14 DIAGNOSIS — Z9841 Cataract extraction status, right eye: Secondary | ICD-10-CM

## 2024-05-14 DIAGNOSIS — J9601 Acute respiratory failure with hypoxia: Secondary | ICD-10-CM | POA: Diagnosis not present

## 2024-05-14 DIAGNOSIS — R0789 Other chest pain: Secondary | ICD-10-CM | POA: Diagnosis not present

## 2024-05-14 DIAGNOSIS — I482 Chronic atrial fibrillation, unspecified: Secondary | ICD-10-CM | POA: Diagnosis present

## 2024-05-14 DIAGNOSIS — Z9181 History of falling: Secondary | ICD-10-CM

## 2024-05-14 DIAGNOSIS — E785 Hyperlipidemia, unspecified: Secondary | ICD-10-CM | POA: Diagnosis present

## 2024-05-14 DIAGNOSIS — J441 Chronic obstructive pulmonary disease with (acute) exacerbation: Secondary | ICD-10-CM | POA: Diagnosis present

## 2024-05-14 DIAGNOSIS — Z7189 Other specified counseling: Secondary | ICD-10-CM | POA: Diagnosis not present

## 2024-05-14 DIAGNOSIS — Z604 Social exclusion and rejection: Secondary | ICD-10-CM | POA: Diagnosis present

## 2024-05-14 DIAGNOSIS — I440 Atrioventricular block, first degree: Secondary | ICD-10-CM | POA: Diagnosis present

## 2024-05-14 DIAGNOSIS — Z9842 Cataract extraction status, left eye: Secondary | ICD-10-CM

## 2024-05-14 LAB — CBC WITH DIFFERENTIAL/PLATELET
Abs Immature Granulocytes: 0.13 10*3/uL — ABNORMAL HIGH (ref 0.00–0.07)
Basophils Absolute: 0.1 10*3/uL (ref 0.0–0.1)
Basophils Relative: 1 %
Eosinophils Absolute: 0.3 10*3/uL (ref 0.0–0.5)
Eosinophils Relative: 1 %
HCT: 40.4 % (ref 36.0–46.0)
Hemoglobin: 12.3 g/dL (ref 12.0–15.0)
Immature Granulocytes: 1 %
Lymphocytes Relative: 18 %
Lymphs Abs: 4.5 10*3/uL — ABNORMAL HIGH (ref 0.7–4.0)
MCH: 25.9 pg — ABNORMAL LOW (ref 26.0–34.0)
MCHC: 30.4 g/dL (ref 30.0–36.0)
MCV: 85.2 fL (ref 80.0–100.0)
Monocytes Absolute: 1.4 10*3/uL — ABNORMAL HIGH (ref 0.1–1.0)
Monocytes Relative: 6 %
Neutro Abs: 18 10*3/uL — ABNORMAL HIGH (ref 1.7–7.7)
Neutrophils Relative %: 73 %
Platelets: 405 10*3/uL — ABNORMAL HIGH (ref 150–400)
RBC: 4.74 MIL/uL (ref 3.87–5.11)
RDW: 15.4 % (ref 11.5–15.5)
WBC: 24.4 10*3/uL — ABNORMAL HIGH (ref 4.0–10.5)
nRBC: 0 % (ref 0.0–0.2)

## 2024-05-14 LAB — COMPREHENSIVE METABOLIC PANEL WITH GFR
ALT: 39 U/L (ref 0–44)
AST: 49 U/L — ABNORMAL HIGH (ref 15–41)
Albumin: 4 g/dL (ref 3.5–5.0)
Alkaline Phosphatase: 89 U/L (ref 38–126)
Anion gap: 13 (ref 5–15)
BUN: 13 mg/dL (ref 8–23)
CO2: 19 mmol/L — ABNORMAL LOW (ref 22–32)
Calcium: 8.9 mg/dL (ref 8.9–10.3)
Chloride: 107 mmol/L (ref 98–111)
Creatinine, Ser: 0.75 mg/dL (ref 0.44–1.00)
GFR, Estimated: >60 mL/min (ref >60)
Glucose, Bld: 230 mg/dL — ABNORMAL HIGH (ref 70–99)
Potassium: 3.5 mmol/L (ref 3.5–5.1)
Sodium: 139 mmol/L (ref 135–145)
Total Bilirubin: 1 mg/dL (ref 0.0–1.2)
Total Protein: 7.2 g/dL (ref 6.5–8.1)

## 2024-05-14 LAB — BLOOD GAS, VENOUS
Acid-base deficit: 0.8 mmol/L (ref 0.0–2.0)
Bicarbonate: 24.9 mmol/L (ref 20.0–28.0)
Delivery systems: POSITIVE
FIO2: 60 %
O2 Saturation: 62.6 %
Patient temperature: 37
pCO2, Ven: 44 mmHg (ref 44–60)
pH, Ven: 7.36 (ref 7.25–7.43)
pO2, Ven: 41 mmHg (ref 32–45)

## 2024-05-14 LAB — D-DIMER, QUANTITATIVE: D-Dimer, Quant: 1.2 ug{FEU}/mL — ABNORMAL HIGH (ref 0.00–0.50)

## 2024-05-14 LAB — LACTIC ACID, PLASMA
Lactic Acid, Venous: 2.8 mmol/L (ref 0.5–1.9)
Lactic Acid, Venous: 3.1 mmol/L (ref 0.5–1.9)

## 2024-05-14 LAB — TSH: TSH: 2.034 u[IU]/mL (ref 0.350–4.500)

## 2024-05-14 LAB — RESP PANEL BY RT-PCR (RSV, FLU A&B, COVID)  RVPGX2
Influenza A by PCR: NEGATIVE
Influenza B by PCR: NEGATIVE
Resp Syncytial Virus by PCR: NEGATIVE
SARS Coronavirus 2 by RT PCR: NEGATIVE

## 2024-05-14 LAB — TROPONIN I (HIGH SENSITIVITY)
Troponin I (High Sensitivity): 14 ng/L (ref <18)
Troponin I (High Sensitivity): 108 ng/L (ref <18)
Troponin I (High Sensitivity): 282 ng/L (ref <18)

## 2024-05-14 LAB — ECHOCARDIOGRAM COMPLETE
AR max vel: 2.03 cm2
AV Area VTI: 1.79 cm2
AV Area mean vel: 1.74 cm2
AV Mean grad: 3 mmHg
AV Peak grad: 4.4 mmHg
Ao pk vel: 1.05 m/s
Area-P 1/2: 3.23 cm2
S' Lateral: 3.4 cm
Single Plane A4C EF: 31.2 %

## 2024-05-14 LAB — APTT
aPTT: 23 s — ABNORMAL LOW (ref 24–36)
aPTT: 62 s — ABNORMAL HIGH (ref 24–36)
aPTT: 74 s — ABNORMAL HIGH (ref 24–36)

## 2024-05-14 LAB — PROCALCITONIN: Procalcitonin: <0.1 ng/mL

## 2024-05-14 LAB — LIPASE, BLOOD: Lipase: 28 U/L (ref 11–51)

## 2024-05-14 LAB — HEPARIN LEVEL (UNFRACTIONATED): Heparin Unfractionated: >1.1 [IU]/mL — ABNORMAL HIGH (ref 0.30–0.70)

## 2024-05-14 LAB — BRAIN NATRIURETIC PEPTIDE: B Natriuretic Peptide: 290.1 pg/mL — ABNORMAL HIGH (ref 0.0–100.0)

## 2024-05-14 MED ORDER — FENTANYL CITRATE PF 50 MCG/ML IJ SOSY
50.0000 ug | PREFILLED_SYRINGE | Freq: Once | INTRAMUSCULAR | Status: AC
  Administered 2024-05-14: 50 ug via INTRAVENOUS
  Filled 2024-05-14: qty 1

## 2024-05-14 MED ORDER — GUAIFENESIN-DM 100-10 MG/5ML PO SYRP
5.0000 mL | ORAL_SOLUTION | ORAL | Status: DC | PRN
  Administered 2024-05-14 – 2024-05-16 (×4): 5 mL via ORAL
  Filled 2024-05-14 (×4): qty 10

## 2024-05-14 MED ORDER — ORAL CARE MOUTH RINSE: 15.0000 mL | OROMUCOSAL | Status: DC | PRN

## 2024-05-14 MED ORDER — ASPIRIN 81 MG PO TBEC
81.0000 mg | DELAYED_RELEASE_TABLET | Freq: Every day | ORAL | Status: DC
  Administered 2024-05-15 – 2024-05-20 (×6): 81 mg via ORAL
  Filled 2024-05-14 (×5): qty 1

## 2024-05-14 MED ORDER — ONDANSETRON HCL 4 MG/2ML IJ SOLN: 4.0000 mg | Freq: Four times a day (QID) | INTRAMUSCULAR | Status: DC | PRN

## 2024-05-14 MED ORDER — NITROGLYCERIN IN D5W 200-5 MCG/ML-% IV SOLN
0.0000 ug/min | INTRAVENOUS | Status: DC
  Administered 2024-05-14: 5 ug/min via INTRAVENOUS
  Filled 2024-05-14: qty 250

## 2024-05-14 MED ORDER — FUROSEMIDE 10 MG/ML IJ SOLN
40.0000 mg | Freq: Once | INTRAMUSCULAR | Status: AC
  Administered 2024-05-14: 40 mg via INTRAVENOUS
  Filled 2024-05-14: qty 4

## 2024-05-14 MED ORDER — BUDESONIDE 0.25 MG/2ML IN SUSP
0.2500 mg | Freq: Two times a day (BID) | RESPIRATORY_TRACT | Status: DC
  Administered 2024-05-14: 0.25 mg via RESPIRATORY_TRACT
  Filled 2024-05-14: qty 2

## 2024-05-14 MED ORDER — ALBUTEROL SULFATE (2.5 MG/3ML) 0.083% IN NEBU: 2.5000 mg | INHALATION_SOLUTION | RESPIRATORY_TRACT | Status: DC | PRN

## 2024-05-14 MED ORDER — FUROSEMIDE 10 MG/ML IJ SOLN
40.0000 mg | Freq: Two times a day (BID) | INTRAMUSCULAR | Status: DC
  Administered 2024-05-14 – 2024-05-18 (×10): 40 mg via INTRAVENOUS
  Filled 2024-05-14 (×10): qty 4

## 2024-05-14 MED ORDER — DONEPEZIL HCL 5 MG PO TABS
10.0000 mg | ORAL_TABLET | Freq: Every day | ORAL | Status: DC
  Administered 2024-05-14 – 2024-05-19 (×6): 10 mg via ORAL
  Filled 2024-05-14 (×7): qty 2

## 2024-05-14 MED ORDER — ASPIRIN 81 MG PO CHEW
CHEWABLE_TABLET | ORAL | Status: AC
Start: 2024-05-14 — End: 2024-05-14
  Administered 2024-05-14: 324 mg via ORAL
  Filled 2024-05-14: qty 1

## 2024-05-14 MED ORDER — ONDANSETRON HCL 4 MG/2ML IJ SOLN
4.0000 mg | Freq: Once | INTRAMUSCULAR | Status: AC
  Administered 2024-05-14: 4 mg via INTRAVENOUS
  Filled 2024-05-14: qty 2

## 2024-05-14 MED ORDER — VENLAFAXINE HCL ER 75 MG PO CP24
75.0000 mg | ORAL_CAPSULE | Freq: Every day | ORAL | Status: DC
  Administered 2024-05-15 – 2024-05-20 (×6): 75 mg via ORAL
  Filled 2024-05-14 (×7): qty 1

## 2024-05-14 MED ORDER — SODIUM CHLORIDE 0.9 % IV SOLN
2.0000 g | Freq: Once | INTRAVENOUS | Status: AC
  Administered 2024-05-14: 2 g via INTRAVENOUS
  Filled 2024-05-14: qty 20

## 2024-05-14 MED ORDER — IPRATROPIUM-ALBUTEROL 0.5-2.5 (3) MG/3ML IN SOLN
3.0000 mL | Freq: Four times a day (QID) | RESPIRATORY_TRACT | Status: DC
  Administered 2024-05-14 – 2024-05-15 (×4): 3 mL via RESPIRATORY_TRACT
  Filled 2024-05-14 (×4): qty 3

## 2024-05-14 MED ORDER — NITROGLYCERIN IN D5W 200-5 MCG/ML-% IV SOLN: 0.0000 ug/min | INTRAVENOUS | Status: DC

## 2024-05-14 MED ORDER — NITROGLYCERIN 2 % TD OINT
0.5000 [in_us] | TOPICAL_OINTMENT | Freq: Once | TRANSDERMAL | Status: AC
  Administered 2024-05-14: 0.5 [in_us] via TOPICAL
  Filled 2024-05-14: qty 1

## 2024-05-14 MED ORDER — PRAVASTATIN SODIUM 20 MG PO TABS
20.0000 mg | ORAL_TABLET | Freq: Every day | ORAL | Status: DC
  Administered 2024-05-14 – 2024-05-19 (×6): 20 mg via ORAL
  Filled 2024-05-14 (×6): qty 1

## 2024-05-14 MED ORDER — LEVOTHYROXINE SODIUM 88 MCG PO TABS
88.0000 ug | ORAL_TABLET | Freq: Every day | ORAL | Status: DC
  Administered 2024-05-15 – 2024-05-20 (×5): 88 ug via ORAL
  Filled 2024-05-14 (×7): qty 1

## 2024-05-14 MED ORDER — SODIUM CHLORIDE 0.9 % IV SOLN
12.5000 mg | Freq: Three times a day (TID) | INTRAVENOUS | Status: DC | PRN
  Filled 2024-05-14: qty 0.5

## 2024-05-14 MED ORDER — ALBUTEROL SULFATE (2.5 MG/3ML) 0.083% IN NEBU
5.0000 mg | INHALATION_SOLUTION | Freq: Once | RESPIRATORY_TRACT | Status: AC
  Administered 2024-05-14: 5 mg via RESPIRATORY_TRACT
  Filled 2024-05-14: qty 6

## 2024-05-14 MED ORDER — ORAL CARE MOUTH RINSE
15.0000 mL | OROMUCOSAL | Status: DC
  Administered 2024-05-14: 15 mL via OROMUCOSAL

## 2024-05-14 MED ORDER — HEPARIN (PORCINE) 25000 UT/250ML-% IV SOLN
1800.0000 [IU]/h | INTRAVENOUS | Status: DC
  Administered 2024-05-14: 900 [IU]/h via INTRAVENOUS
  Administered 2024-05-15: 1050 [IU]/h via INTRAVENOUS
  Administered 2024-05-16: 1350 [IU]/h via INTRAVENOUS
  Administered 2024-05-16: 1050 [IU]/h via INTRAVENOUS
  Administered 2024-05-17: 1450 [IU]/h via INTRAVENOUS
  Administered 2024-05-18: 1700 [IU]/h via INTRAVENOUS
  Administered 2024-05-18: 1800 [IU]/h via INTRAVENOUS
  Filled 2024-05-14 (×7): qty 250

## 2024-05-14 MED ORDER — ASPIRIN 81 MG PO CHEW
324.0000 mg | CHEWABLE_TABLET | Freq: Once | ORAL | Status: AC
  Filled 2024-05-14: qty 4

## 2024-05-14 MED ORDER — MORPHINE SULFATE (PF) 2 MG/ML IV SOLN
2.0000 mg | INTRAVENOUS | Status: DC | PRN
  Administered 2024-05-14: 2 mg via INTRAVENOUS
  Filled 2024-05-14: qty 1

## 2024-05-14 MED ORDER — SODIUM CHLORIDE 0.9 % IV SOLN
500.0000 mg | Freq: Once | INTRAVENOUS | Status: AC
  Administered 2024-05-14: 500 mg via INTRAVENOUS
  Filled 2024-05-14: qty 5

## 2024-05-14 MED ORDER — IPRATROPIUM BROMIDE 0.02 % IN SOLN
0.5000 mg | Freq: Once | RESPIRATORY_TRACT | Status: AC
  Administered 2024-05-14: 0.5 mg via RESPIRATORY_TRACT
  Filled 2024-05-14: qty 2.5

## 2024-05-14 MED ORDER — ACETAMINOPHEN 325 MG PO TABS
650.0000 mg | ORAL_TABLET | ORAL | Status: DC | PRN
  Administered 2024-05-14: 650 mg via ORAL
  Filled 2024-05-14: qty 2

## 2024-05-14 MED ORDER — AMIODARONE HCL 200 MG PO TABS
200.0000 mg | ORAL_TABLET | Freq: Every day | ORAL | Status: DC
Start: 2024-05-14 — End: 2024-05-16
  Administered 2024-05-15 – 2024-05-16 (×2): 200 mg via ORAL
  Filled 2024-05-14 (×2): qty 1

## 2024-05-14 MED ORDER — HEPARIN BOLUS VIA INFUSION
1100.0000 [IU] | Freq: Once | INTRAVENOUS | Status: AC
  Administered 2024-05-14: 1100 [IU] via INTRAVENOUS
  Filled 2024-05-14: qty 1100

## 2024-05-14 NOTE — ED Provider Notes (Addendum)
 [facility] Provider Note    Event Date/Time   First MD Initiated Contact with Patient 05/14/24 0011     (approximate)   History   Shortness of Breath   HPI  Alison Richardson is a 89 y.o. female with history of dementia, atrial fibrillation on Eliquis , CHF, pulmonary hypertension, hypertension, diabetes, hyperlipidemia who presents to the emergency department with shortness of breath, left-sided dull chest pain.  Hypoxic on room air.  Does not wear oxygen chronically.  No history of PE or DVT.  No calf tenderness or calf swelling.  Denies any known fever.  Is diaphoretic here.  Received 2 DuoNebs and 125 mg of IV Solu-Medrol  with EMS.  EMS reports history of COPD although not listed in patient's chart.  Patient reports feeling better after breathing treatments.   History provided by patient, EMS.    Past Medical History:  Diagnosis Date   Acute on chronic congestive heart failure (HCC) 10/07/2020   Arrhythmia    Atrial fibrillation and flutter (HCC) 01/06/2019   Plavix  added to eliquis  10-22 after nstemi       Last Assessment & Plan:    Marked facial hematoma post fall but ct of neck and head reportedly normal. No ha or ataxia now but is using a walker. On eliquis  and plavix      Atrial flutter (HCC) 01/2018   new onset    Basal cell carcinoma of back    Basal cell carcinoma of lip    Cerebral aneurysm    followed by [facility]   CHF (congestive heart failure) (HCC)    DDD (degenerative disc disease), lumbar    superior plate depression, L3 08/18/2014   Diabetes mellitus type II, controlled (HCC)    Diverticulosis    Dysrhythmia    Paroxysmal Supraventricular Tachycardia   Dysthymia    depression   GERD (gastroesophageal reflux disease)    History of falling 04/03/2022   History of meniscal tear    Humeral surgical neck fracture 05/16/2022   Hyperlipidemia    Hypertension    Hypokalemia 12/12/2022   Hypothyroidism    Late onset Alzheimer's disease  with behavioral disturbance (HCC)    Leukocytosis 10/07/2020   Mild pulmonary hypertension (HCC)    Moderate asthma without complication 12/12/2022   Multifocal pneumonia 12/09/2022   Overactive bladder    Peripheral vascular disease (HCC)    Puncture wound of forehead 06/11/2023   Seasonal allergic rhinitis    Severe sepsis (HCC) 02/07/2022   Sleep apnea     Past Surgical History:  Procedure Laterality Date   APPENDECTOMY  1946   BASAL CELL CARCINOMA EXCISION     2006 and 2009 removed from back and lip   CARDIOVASCULAR STRESS TEST  2015   nuclear cardiac stress test negative for ischemia - Dr. Adelina Adu   CATARACT EXTRACTION, BILATERAL  2006   COLONOSCOPY  2014   COLONOSCOPY N/A 08/19/2020   Procedure: COLONOSCOPY;  Surgeon: Shane Darling, MD;  Location: [facility] ENDOSCOPY;  Service: Endoscopy;  Laterality: N/A;   ECTOPIC PREGNANCY SURGERY  1957   ESOPHAGOGASTRODUODENOSCOPY N/A 08/19/2020   Procedure: ESOPHAGOGASTRODUODENOSCOPY (EGD);  Surgeon: Shane Darling, MD;  Location: [facility] ENDOSCOPY;  Service: Endoscopy;  Laterality: N/A;   INJECTION KNEE Left 05/2015   LEFT HEART CATH AND CORONARY ANGIOGRAPHY N/A 10/12/2021   Procedure: LEFT HEART CATH AND CORONARY ANGIOGRAPHY;  Surgeon: Michelle Aid, MD;  Location: [facility] INVASIVE CV LAB;  Service: Cardiovascular;  Laterality: N/A;   REPLACEMENT  TOTAL KNEE Left 09/06/2016   Dr. Kita Perish   TONSILLECTOMY AND ADENOIDECTOMY  1953   TOTAL ABDOMINAL HYSTERECTOMY  1986   DU B    MEDICATIONS:  Prior to Admission medications   Medication Sig Start Date End Date Taking? Authorizing Provider  albuterol  (PROVENTIL ) (2.5 MG/3ML) 0.083% nebulizer solution Take 3 mLs (2.5 mg total) by nebulization every 6 (six) hours as needed for wheezing or shortness of breath. 04/03/24   Aileen Alexanders, NP  amiodarone  (PACERONE ) 200 MG tablet Take 1 tablet (200 mg total) by mouth daily. 12/09/23   Gollan, Timothy J, MD  apixaban  (ELIQUIS ) 5 MG TABS  tablet Take 1 tablet (5 mg total) by mouth 2 (two) times daily. 12/09/23   Gollan, Timothy J, MD  donepezil  (ARICEPT ) 10 MG tablet TAKE ONE TABLET (10 MG) BY MOUTH AT BEDTIME 04/01/24   Hadassah Letters, MD  gabapentin  (NEURONTIN ) 100 MG capsule Take 1 capsule (100 mg total) by mouth at bedtime. 09/10/23   Hadassah Letters, MD  levothyroxine  (SYNTHROID ) 88 MCG tablet TAKE 1 TABLET EVERY DAY ON EMPTY STOMACHWITH A GLASS OF WATER AT LEAST 30-60 MINBEFORE BREAKFAST 10/31/23   Hadassah Letters, MD  metoprolol  succinate (TOPROL -XL) 25 MG 24 hr tablet Take 0.5 tablets (12.5 mg total) by mouth daily. Take with or immediately following a meal. 05/12/24   Hadassah Letters, MD  nitroGLYCERIN  (NITROSTAT ) 0.4 MG SL tablet Place 1 tablet (0.4 mg total) under the tongue every 5 (five) minutes as needed for chest pain. 12/09/23   Gollan, Timothy J, MD  potassium chloride  SA (KLOR-CON  M) 20 MEQ tablet Take 1 tablet (20 mEq total) by mouth daily as needed. Take it on the days you take Torsemide  05/09/24   Patel, Sona, MD  pravastatin  (PRAVACHOL ) 20 MG tablet Take 1 tablet (20 mg total) by mouth at bedtime. 12/09/23   Gollan, Timothy J, MD  Semaglutide ,0.25 or 0.5MG /DOS, (OZEMPIC , 0.25 OR 0.5 MG/DOSE,) 2 MG/1.5ML SOPN Inject 0.5 mg into the skin once a week. 05/05/24   Aileen Alexanders, NP  Tiotropium Bromide Monohydrate  (SPIRIVA  RESPIMAT) 1.25 MCG/ACT AERS Inhale into the lungs.    [provider]  torsemide  (DEMADEX ) 20 MG tablet Take 1 tablet (20 mg total) by mouth daily as needed. For leg edema or sob 05/09/24   Melvinia Stager, MD  venlafaxine  XR (EFFEXOR -XR) 75 MG 24 hr capsule TAKE 1 CAPSULE BY MOUTH ONCE DAILY. 02/06/24   Hadassah Letters, MD    Physical Exam   Triage Vital Signs: ED Triage Vitals  Encounter Vitals Group     BP 05/14/24 0018 (!) 153/105     Systolic BP Percentile --      Diastolic BP Percentile --      Pulse Rate 05/14/24 0018 73     Resp 05/14/24 0018 (!) 33     Temp 05/14/24 0018 98.2 F  (36.8 C)     Temp src --      SpO2 05/14/24 0018 (!) 87 %     Weight --      Height --      Head Circumference --      Peak Flow --      Pain Score 05/14/24 0015 0     Pain Loc --      Pain Education --      Exclude from Growth Chart --     Most recent vital signs: Vitals:   05/14/24 0630 05/14/24 0634  BP: (!) 146/83   Pulse:  63 62  Resp: 17 18  Temp:    SpO2: 94% 93%    CONSTITUTIONAL: Alert, responds appropriately to questions.  In respiratory distress.  Well-appearing; well-nourished HEAD: Normocephalic, atraumatic EYES: Conjunctivae clear, pupils appear equal, sclera nonicteric ENT: normal nose; moist mucous membranes NECK: Supple, normal ROM CARD: RRR; S1 and S2 appreciated RESP: Patient in mild distress.  She is tachypneic.  Diffuse inspiratory and expiratory wheezing appreciated.  Diminished aeration at bases.  No rhonchi or rales.  She has hypoxic on room air 85%.  Speaking in short sentences. ABD/GI: Non-distended; soft, non-tender, no rebound, no guarding, no peritoneal signs BACK: The back appears normal EXT: Normal ROM in all joints; no deformity noted, no edema, no calf tenderness or calf swelling SKIN: Normal color for age and race; warm; no rash on exposed skin NEURO: Moves all extremities equally, normal speech PSYCH: The patient's mood and manner are appropriate.   ED Results / Procedures / Treatments   LABS: (all labs ordered are listed, but only abnormal results are displayed) Labs Reviewed  CBC WITH DIFFERENTIAL/PLATELET - Abnormal; Notable for the following components:      Result Value   WBC 24.4 (*)    MCH 25.9 (*)    Platelets 405 (*)    Neutro Abs 18.0 (*)    Lymphs Abs 4.5 (*)    Monocytes Absolute 1.4 (*)    Abs Immature Granulocytes 0.13 (*)    All other components within normal limits  COMPREHENSIVE METABOLIC PANEL WITH GFR - Abnormal; Notable for the following components:   CO2 19 (*)    Glucose, Bld 230 (*)    AST 49 (*)    All  other components within normal limits  BRAIN NATRIURETIC PEPTIDE - Abnormal; Notable for the following components:   B Natriuretic Peptide 290.1 (*)    All other components within normal limits  LACTIC ACID, PLASMA - Abnormal; Notable for the following components:   Lactic Acid, Venous 2.8 (*)    All other components within normal limits  LACTIC ACID, PLASMA - Abnormal; Notable for the following components:   Lactic Acid, Venous 3.1 (*)    All other components within normal limits  APTT - Abnormal; Notable for the following components:   aPTT 23 (*)    All other components within normal limits  HEPARIN  LEVEL (UNFRACTIONATED) - Abnormal; Notable for the following components:   Heparin  Unfractionated >1.10 (*)    All other components within normal limits  D-DIMER, QUANTITATIVE (NOT AT [facility]) - Abnormal; Notable for the following components:   D-Dimer, Quant 1.20 (*)    All other components within normal limits  TROPONIN I (HIGH SENSITIVITY) - Abnormal; Notable for the following components:   Troponin I (High Sensitivity) 108 (*)    All other components within normal limits  RESP PANEL BY RT-PCR (RSV, FLU A&B, COVID)  RVPGX2  CULTURE, BLOOD (ROUTINE X 2)  CULTURE, BLOOD (ROUTINE X 2)  LIPASE, BLOOD  BLOOD GAS, VENOUS  PROCALCITONIN  HIV ANTIBODY (ROUTINE TESTING W REFLEX)  APTT  TROPONIN I (HIGH SENSITIVITY)     EKG:  EKG Interpretation Date/Time:  Thursday May 14 2024 00:17:50 EDT Ventricular Rate:  70 PR Interval:  239 QRS Duration:  121 QT Interval:  448 QTC Calculation: 484 R Axis:   -71  Text Interpretation: Sinus rhythm Prolonged PR interval Left bundle branch block No significant change since last tracing Confirmed by Tascha Casares, Starling Eck ([id number]) on 05/14/2024 12:24:47 AM  RADIOLOGY: My personal review and interpretation of imaging: Chest x-ray shows pulmonary edema.  I have personally reviewed all radiology reports.   DG Chest Portable 1 View Result Date:  05/14/2024 CLINICAL DATA:  Dyspnea, hypoxia, CHF EXAM: PORTABLE CHEST 1 VIEW COMPARISON:  05/08/2024 FINDINGS: The lungs are symmetrically well expanded. Interval development of diffuse interstitial pulmonary edema, in keeping with changes of mild to moderate cardiogenic failure. No pneumothorax or pleural effusion. Cardiac size is mildly enlarged, stable. No acute bone abnormality. IMPRESSION: 1. Mild to moderate cardiogenic failure. Electronically Signed   By: Worthy Heads M.D.   On: 05/14/2024 00:48     PROCEDURES:  Critical Care performed: Yes, see critical care procedure note(s)   CRITICAL CARE Performed by: Starling Eck Nevyn Bossman   Total critical care time: 60 minutes  Critical care time was exclusive of separately billable procedures and treating other patients.  Critical care was necessary to treat or prevent imminent or life-threatening deterioration.  Critical care was time spent personally by me on the following activities: development of treatment plan with patient and/or surrogate as well as nursing, discussions with consultants, evaluation of patient's response to treatment, examination of patient, obtaining history from patient or surrogate, ordering and performing treatments and interventions, ordering and review of laboratory studies, ordering and review of radiographic studies, pulse oximetry and re-evaluation of patient's condition.   Aaron Aas1-3 Lead EKG Interpretation  Performed by: Jenisa Monty, Clover Dao, DO Authorized by: Jamilette Suchocki, Clover Dao, DO     Interpretation: normal     ECG rate:  74   ECG rate assessment: normal     Rhythm: sinus rhythm     Ectopy: none     Conduction: normal       IMPRESSION / MDM / ASSESSMENT AND PLAN / ED COURSE  I reviewed the triage vital signs and the nursing notes.    Patient here with shortness of breath, hypoxia.  The patient is on the cardiac monitor to evaluate for evidence of arrhythmia and/or significant heart rate  changes.   DIFFERENTIAL DIAGNOSIS (includes but not limited to):   ACS, CHF exacerbation, bronchospasm, pneumonia, viral URI, pneumothorax, PE   Patient's presentation is most consistent with acute presentation with potential threat to life or bodily function.   PLAN: Will obtain labs, chest x-ray.  Will place patient on BiPAP due to hypoxia and increased work of breathing.  Will continue breathing treatments as she does report feeling better after getting breathing treatments with EMS although no documented history of asthma/COPD.  Patient will need admission given new oxygen requirement.   MEDICATIONS GIVEN IN ED: Medications  heparin  ADULT infusion 100 units/mL (25000 units/250mL) (900 Units/hr Intravenous New Bag/Given 05/14/24 0536)  amiodarone  (PACERONE ) tablet 200 mg (has no administration in time range)  pravastatin  (PRAVACHOL ) tablet 20 mg (has no administration in time range)  donepezil  (ARICEPT ) tablet 10 mg (has no administration in time range)  venlafaxine  XR (EFFEXOR -XR) 24 hr capsule 75 mg (has no administration in time range)  levothyroxine  (SYNTHROID ) tablet 88 mcg (88 mcg Oral Not Given 05/14/24 0546)  aspirin  EC tablet 81 mg (has no administration in time range)  acetaminophen  (TYLENOL ) tablet 650 mg (has no administration in time range)  ondansetron  (ZOFRAN ) injection 4 mg (has no administration in time range)  nitroGLYCERIN  50 mg in dextrose  5 % 250 mL (0.2 mg/mL) infusion (5 mcg/min Intravenous New Bag/Given 05/14/24 0540)  furosemide  (LASIX ) injection 40 mg (has no administration in time range)  albuterol  (PROVENTIL ) (2.5 MG/3ML) 0.083% nebulizer  solution 2.5 mg (has no administration in time range)  morphine  (PF) 2 MG/ML injection 2 mg (has no administration in time range)  aspirin  chewable tablet 324 mg (324 mg Oral Given 05/14/24 0416)  albuterol  (PROVENTIL ) (2.5 MG/3ML) 0.083% nebulizer solution 5 mg (5 mg Nebulization Given 05/14/24 0039)  ipratropium (ATROVENT)  nebulizer solution 0.5 mg (0.5 mg Nebulization Given 05/14/24 0039)  furosemide  (LASIX ) injection 40 mg (40 mg Intravenous Given 05/14/24 0103)  ondansetron  (ZOFRAN ) injection 4 mg (4 mg Intravenous Given 05/14/24 0117)  fentaNYL  (SUBLIMAZE ) injection 50 mcg (50 mcg Intravenous Given 05/14/24 0150)  furosemide  (LASIX ) injection 40 mg (40 mg Intravenous Given 05/14/24 0150)  nitroGLYCERIN  (NITROGLYN) 2 % ointment 0.5 inch (0.5 inches Topical Given 05/14/24 0151)  ondansetron  (ZOFRAN ) injection 4 mg (4 mg Intravenous Given 05/14/24 0150)  cefTRIAXone  (ROCEPHIN ) 2 g in sodium chloride  0.9 % 100 mL IVPB (0 g Intravenous Stopped 05/14/24 0348)  azithromycin  (ZITHROMAX ) 500 mg in sodium chloride  0.9 % 250 mL IVPB (0 mg Intravenous Stopped 05/14/24 0423)  fentaNYL  (SUBLIMAZE ) injection 50 mcg (50 mcg Intravenous Given 05/14/24 0317)  fentaNYL  (SUBLIMAZE ) injection 50 mcg (50 mcg Intravenous Given 05/14/24 0449)  ondansetron  (ZOFRAN ) injection 4 mg (4 mg Intravenous Given 05/14/24 0449)     ED COURSE: Chest x-ray reviewed and interpreted by myself and the radiologist and shows pulmonary edema consistent with CHF exacerbation.  Will give IV Lasix .  Patient vomited with nurse in the room.  Nurse was able to take her off BiPAP quickly to prevent aspiration.  Patient now switched to high flow nasal cannula.  Patient confirms she is a DNR/DNI.  1:46 AM  Pt's sats are 81% on max settings of high flow nasal cannula.  She still has significant increased work of breathing and is diaphoretic.  Patient states "I am dying".  Offered intubation which patient continues to decline.  Her daughter is on her way to the ED.  Will give additional doses of Zofran , IV Lasix .  Now that her blood pressure is a little bit more elevated, will place Nitropaste on her chest.  Will be very cautious with nitroglycerin  given she was just here in the ED for hypotension.  Will transition her back to BiPAP.  Patient agrees with this  plan.  Patient also has a leukocytosis of 24,000 which may be reactive in nature but given significant increase from a normal white count of 9.2 five days ago, will cover for community-acquired pneumonia with azithromycin  and ceftriaxone .  Will add on procalcitonin, obtain cultures, lactic.  First troponin is negative.  BNP 290.  COVID, flu, RSV negative.  Initial VBG showed normal pH, PCO2, PO2.   2:22 AM  Pt's daughter is now at bedside.  Patient appears significantly more comfortable after second dose of Lasix , Nitropaste and fentanyl .  Sats 93% on BiPAP.  Significantly improved work of breathing.  Patient states she is feeling much better now able to speak full sentences.  Blood pressure in the 120s/70s.  I feel she is appropriate for hospitalist admission.  4:23 AM  Pt's procalcitonin is negative.  Lactic is elevated at 2.8 but I suspect that this is secondary to her respiratory failure rather than sepsis.  Will hold IV fluids given we are diuresing her.  Second troponin elevated at 108.  Aspirin  has been ordered.  Will also start heparin .  Hospitalist updated.  5:04 AM  Pt felt like she was going to vomit so nurse removed her from BiPAP.  Patient then went into  respiratory distress again and became diaphoretic with left-sided dull chest pain that went into the left arm.  Sats 83-87% on nonrebreather.  Patient still refusing intubation.  Will obtain repeat EKG and start nitroglycerin  infusion.  Will obtain D-dimer given such profound hypoxia despite IV Lasix  x 2.  Patient reports at this time she is not ready to transition to comfort care and is comfortable with continuing the workup and treatment that we are currently doing.    5:27 AM  Pt's sats 74% on NRB.  WIll place back on bipap.  Repeat EKG shows no STEMI but does show lateral T wave inversions.  Those have been present previously on EKG in December 2024.  Was not present on earlier EKGs today.  6:32 AM  Pt's D-dimer  elevated however I do not feel patient is stable enough to go over to CT scan or nuclear medicine at this time.  She is getting anticoagulated with heparin .  Sats still ranging in the mid 80s to upper 90s on BiPAP.   CONSULTS:  Consulted and discussed patient's case with hospitalist, Dr. Vallarie Gauze.  I have recommended admission and consulting physician agrees and will place admission orders.  Patient (and family if present) agree with this plan.   I reviewed all nursing notes, vitals, pertinent previous records.  All labs, EKGs, imaging ordered have been independently reviewed and interpreted by myself.    OUTSIDE RECORDS REVIEWED: Reviewed last PCP note on 05/05/2024.       FINAL CLINICAL IMPRESSION(S) / ED DIAGNOSES   Final diagnoses:  Acute respiratory failure with hypoxia (HCC)  Acute on chronic congestive heart failure, unspecified heart failure type (HCC)  NSTEMI (non-ST elevated myocardial infarction) (HCC)     Rx / DC Orders   ED Discharge Orders     None        Note:  This document was prepared using Dragon voice recognition software and may include unintentional dictation errors.       Laakea Pereira, Clover Dao, DO 05/14/24 1610    Hershell Brandl, Clover Dao, DO 05/14/24 [phone]    Kimora Stankovic, Clover Dao, DO 05/14/24 [phone]

## 2024-05-14 NOTE — ED Notes (Signed)
 MD made aware that PO meds were being held due to pt being on bipap.

## 2024-05-14 NOTE — Assessment & Plan Note (Signed)
 Delirium precautions

## 2024-05-14 NOTE — Assessment & Plan Note (Signed)
 Continue pravastatin  Currently on aspirin 

## 2024-05-14 NOTE — Assessment & Plan Note (Signed)
 Sliding scale insulin coverage

## 2024-05-14 NOTE — Hospital Course (Signed)
 Alison Richardson

## 2024-05-14 NOTE — Progress Notes (Signed)
 OT Cancellation Note  Patient Details Name: Alison Richardson MRN: [id number] DOB: 06-21-1933   Cancelled Treatment:    Reason Eval/Treat Not Completed: Medical issues which prohibited therapy. OT order received and chart reviewed. Pt recently on nitroglycerin  drip for chest pain this morning and also placed on bipap. OT to hold evaluation until pt is able to actively participate in OT intervention.   George Kinder, MS, OTR/L , CBIS ascom [phone]  05/14/24, 9:03 AM

## 2024-05-14 NOTE — ED Notes (Signed)
 RN called lab to send phlebotomist to collect troponin at this time. Per lab, they will send phlebotomist.

## 2024-05-14 NOTE — Progress Notes (Signed)
 PHARMACY - ANTICOAGULATION CONSULT NOTE  Pharmacy Consult for Heparin   Indication: chest pain/ACS  Allergies  Allergen Reactions   Pantoprazole  Nausea And Vomiting   Metformin And Related Diarrhea    Patient Measurements: Height: 5' 5.98" (167.6 cm) Weight: 74.8 kg (164 lb 14.5 oz) IBW/kg (Calculated) : 59.26 HEPARIN  DW (KG): 74.3  Vital Signs: Temp: 98 F (36.7 C) (05/22 1944) Temp Source: Oral (05/22 1944) BP: 101/86 (05/22 1919) Pulse Rate: 72 (05/22 1919)  Labs: Recent Labs    05/12/24 1105 05/14/24 0034 05/14/24 0207 05/14/24 0424 05/14/24 1259 05/14/24 2223  HGB  --  12.3  --   --   --   --   HCT  --  40.4  --   --   --   --   PLT  --  405*  --   --   --   --   APTT  --   --   --  23* 62* 74*  HEPARINUNFRC  --   --   --  >1.10*  --   --   CREATININE 0.91 0.75  --   --   --   --   TROPONINIHS  --  14 108*  --  282*  --     Estimated Creatinine Clearance: 49.3 mL/min (by C-G formula based on SCr of 0.75 mg/dL).   Medical History: Past Medical History:  Diagnosis Date   Acute on chronic congestive heart failure (HCC) 10/07/2020   Arrhythmia    Atrial fibrillation and flutter (HCC) 01/06/2019   Plavix  added to eliquis  10-22 after nstemi       Last Assessment & Plan:    Marked facial hematoma post fall but ct of neck and head reportedly normal. No ha or ataxia now but is using a walker. On eliquis  and plavix      Atrial flutter (HCC) 01/2018   new onset    Basal cell carcinoma of back    Basal cell carcinoma of lip    Cerebral aneurysm    followed by [facility]   CHF (congestive heart failure) (HCC)    DDD (degenerative disc disease), lumbar    superior plate depression, L3 08/18/2014   Diabetes mellitus type II, controlled (HCC)    Diverticulosis    Dysrhythmia    Paroxysmal Supraventricular Tachycardia   Dysthymia    depression   GERD (gastroesophageal reflux disease)    History of falling 04/03/2022   History of meniscal tear    Humeral surgical  neck fracture 05/16/2022   Hyperlipidemia    Hypertension    Hypokalemia 12/12/2022   Hypothyroidism    Late onset Alzheimer's disease with behavioral disturbance (HCC)    Leukocytosis 10/07/2020   Mild pulmonary hypertension (HCC)    Moderate asthma without complication 12/12/2022   Multifocal pneumonia 12/09/2022   Overactive bladder    Peripheral vascular disease (HCC)    Puncture wound of forehead 06/11/2023   Seasonal allergic rhinitis    Severe sepsis (HCC) 02/07/2022   Sleep apnea     Medications:  Medications Prior to Admission  Medication Sig Dispense Refill Last Dose/Taking   albuterol  (PROVENTIL ) (2.5 MG/3ML) 0.083% nebulizer solution Take 3 mLs (2.5 mg total) by nebulization every 6 (six) hours as needed for wheezing or shortness of breath. 75 mL 3 Taking As Needed   amiodarone  (PACERONE ) 200 MG tablet Take 1 tablet (200 mg total) by mouth daily. 90 tablet 3 Taking   apixaban  (ELIQUIS ) 5 MG TABS tablet Take 1 tablet (  5 mg total) by mouth 2 (two) times daily. 180 tablet 3 Taking   donepezil  (ARICEPT ) 10 MG tablet TAKE ONE TABLET (10 MG) BY MOUTH AT BEDTIME 90 tablet 0 Taking   gabapentin  (NEURONTIN ) 100 MG capsule Take 1 capsule (100 mg total) by mouth at bedtime.   Taking   levothyroxine  (SYNTHROID ) 88 MCG tablet TAKE 1 TABLET EVERY DAY ON EMPTY STOMACHWITH A GLASS OF WATER AT LEAST 30-60 MINBEFORE BREAKFAST 90 tablet 3 Taking   metoprolol  succinate (TOPROL -XL) 25 MG 24 hr tablet Take 0.5 tablets (12.5 mg total) by mouth daily. Take with or immediately following a meal.   Taking   nitroGLYCERIN  (NITROSTAT ) 0.4 MG SL tablet Place 1 tablet (0.4 mg total) under the tongue every 5 (five) minutes as needed for chest pain. 25 tablet 3 Taking As Needed   potassium chloride  SA (KLOR-CON  M) 20 MEQ tablet Take 1 tablet (20 mEq total) by mouth daily as needed. Take it on the days you take Torsemide  30 tablet 0 Taking As Needed   pravastatin  (PRAVACHOL ) 20 MG tablet Take 1 tablet (20 mg  total) by mouth at bedtime. 90 tablet 3 Taking   Semaglutide ,0.25 or 0.5MG /DOS, (OZEMPIC , 0.25 OR 0.5 MG/DOSE,) 2 MG/1.5ML SOPN Inject 0.5 mg into the skin once a week. 6 mL 1 Taking   Tiotropium Bromide Monohydrate  (SPIRIVA  RESPIMAT) 1.25 MCG/ACT AERS Inhale into the lungs.   Taking   torsemide  (DEMADEX ) 20 MG tablet Take 1 tablet (20 mg total) by mouth daily as needed. For leg edema or sob 30 tablet 0 Taking As Needed   venlafaxine  XR (EFFEXOR -XR) 75 MG 24 hr capsule TAKE 1 CAPSULE BY MOUTH ONCE DAILY. 90 capsule 0 Taking    Assessment: Pharmacy consulted to dose heparin  in this 89 year old female admitted with ACS/NSTEMI.  Pt was on Eliquis  5 mg PO BID PTA,  last dose on 5/21 @ 1900. CrCl = 49.3 ml/min   Date/Time aPTT/HL Rate  Comment 5/22 1259 62/-  900 units/hr Slightly subtherapeutic  5/22 2223        74                  1100 units/hr     therapeutic X 1  Goal of Therapy:  Heparin  level 0.3-0.7 units/ml aPTT 66 - 102 seconds Monitor platelets by anticoagulation protocol: Yes   Plan:  5/22: aPTT @ 2223 = 74, therapeutic X 1 - will continue pt on current rate and recheck aPTT and HL in 8 hrs - use aPTT to guide dosing until correlating with HL  - CBC daily   Kaydyn Sayas D, PharmD 05/14/2024,11:07 PM

## 2024-05-14 NOTE — Progress Notes (Signed)
 PHARMACY - ANTICOAGULATION CONSULT NOTE  Pharmacy Consult for Heparin   Indication: chest pain/ACS  Allergies  Allergen Reactions   Pantoprazole  Nausea And Vomiting   Metformin And Related Diarrhea    Patient Measurements: Height: 5' 5.98" (167.6 cm) Weight: 74.8 kg (164 lb 14.5 oz) IBW/kg (Calculated) : 59.26 HEPARIN  DW (KG): 74.3  Vital Signs: Temp: 96.5 F (35.8 C) (05/22 0955) Temp Source: Axillary (05/22 0955) BP: 118/74 (05/22 1245) Pulse Rate: 64 (05/22 1245)  Labs: Recent Labs    05/12/24 1105 05/14/24 0034 05/14/24 0207 05/14/24 0424 05/14/24 1259  HGB  --  12.3  --   --   --   HCT  --  40.4  --   --   --   PLT  --  405*  --   --   --   APTT  --   --   --  23* 62*  HEPARINUNFRC  --   --   --  >1.10*  --   CREATININE 0.91 0.75  --   --   --   TROPONINIHS  --  14 108*  --   --     Estimated Creatinine Clearance: 49.3 mL/min (by C-G formula based on SCr of 0.75 mg/dL).   Medical History: Past Medical History:  Diagnosis Date   Acute on chronic congestive heart failure (HCC) 10/07/2020   Arrhythmia    Atrial fibrillation and flutter (HCC) 01/06/2019   Plavix  added to eliquis  10-22 after nstemi       Last Assessment & Plan:    Marked facial hematoma post fall but ct of neck and head reportedly normal. No ha or ataxia now but is using a walker. On eliquis  and plavix      Atrial flutter (HCC) 01/2018   new onset    Basal cell carcinoma of back    Basal cell carcinoma of lip    Cerebral aneurysm    followed by [facility]   CHF (congestive heart failure) (HCC)    DDD (degenerative disc disease), lumbar    superior plate depression, L3 08/18/2014   Diabetes mellitus type II, controlled (HCC)    Diverticulosis    Dysrhythmia    Paroxysmal Supraventricular Tachycardia   Dysthymia    depression   GERD (gastroesophageal reflux disease)    History of falling 04/03/2022   History of meniscal tear    Humeral surgical neck fracture 05/16/2022   Hyperlipidemia     Hypertension    Hypokalemia 12/12/2022   Hypothyroidism    Late onset Alzheimer's disease with behavioral disturbance (HCC)    Leukocytosis 10/07/2020   Mild pulmonary hypertension (HCC)    Moderate asthma without complication 12/12/2022   Multifocal pneumonia 12/09/2022   Overactive bladder    Peripheral vascular disease (HCC)    Puncture wound of forehead 06/11/2023   Seasonal allergic rhinitis    Severe sepsis (HCC) 02/07/2022   Sleep apnea     Medications:  (Not in a hospital admission)   Assessment: Pharmacy consulted to dose heparin  in this 89 year old female admitted with ACS/NSTEMI.  Pt was on Eliquis  5 mg PO BID PTA,  last dose on 5/21 @ 1900. CrCl = 49.3 ml/min   Date/Time aPTT/HL Rate  Comment 5/22 1259 62/-  900 units/hr Slightly subtherapeutic   Goal of Therapy:  Heparin  level 0.3-0.7 units/ml aPTT 66 - 102 seconds Monitor platelets by anticoagulation protocol: Yes   Plan:  - aPTT level slightly subtherapeutic  - give 1100 unit bolus x 1 -  increase heparin  infusion rate to 1050 units/hr - use aPTT to guide dosing until correlating with HL  - will draw aPTT 8 hrs after rate change - will draw HL on 5/23 with AM labs - CBC daily   Alice Innocent, PharmD 05/14/2024,1:44 PM

## 2024-05-14 NOTE — Progress Notes (Signed)
 Late entry: Approx 0125 called to patient's room secondary to patient vomiting. Covering RN was at bedside and was able to take mask off so no vomiting inside of mask. Placed patient on HFNC, however patient only tolerated this for approximately 20 minutes before drop in saturation noted. Placed patient back on BIPAP with maxed FIO2, MD is aware, no further changes at this time.

## 2024-05-14 NOTE — ED Notes (Signed)
 Reassessment after initiation of nitroglycerin  drip. Patient states that the chest heaviness has resolved for the moment. Educated patient and family that the nitroglycerin  drip is to treat the chest pain, and to please notify myself if the chest pain resumes.

## 2024-05-14 NOTE — Assessment & Plan Note (Signed)
 CAD Troponin 14--108, without acute EKG changes or complaints of chest pain Continue to trend troponin to peak On heparin  infusion so will hold Eliquis  Continue pravastatin .  Continue metoprolol  as BP would tolerate Cardiology consult

## 2024-05-14 NOTE — Progress Notes (Signed)
 PHARMACY - ANTICOAGULATION CONSULT NOTE  Pharmacy Consult for Heparin   Indication: chest pain/ACS  Allergies  Allergen Reactions   Pantoprazole  Nausea And Vomiting   Metformin And Related Diarrhea    Patient Measurements:    Vital Signs: Temp: 97.9 F (36.6 C) (05/22 0407) Temp Source: Oral (05/22 0407) BP: 132/87 (05/22 0330) Pulse Rate: 65 (05/22 0347)  Labs: Recent Labs    05/12/24 1105 05/14/24 0034 05/14/24 0207  HGB  --  12.3  --   HCT  --  40.4  --   PLT  --  405*  --   CREATININE 0.91 0.75  --   TROPONINIHS  --  14 108*    Estimated Creatinine Clearance: 49.3 mL/min (by C-G formula based on SCr of 0.75 mg/dL).   Medical History: Past Medical History:  Diagnosis Date   Acute on chronic congestive heart failure (HCC) 10/07/2020   Arrhythmia    Atrial fibrillation and flutter (HCC) 01/06/2019   Plavix  added to eliquis  10-22 after nstemi       Last Assessment & Plan:    Marked facial hematoma post fall but ct of neck and head reportedly normal. No ha or ataxia now but is using a walker. On eliquis  and plavix      Atrial flutter (HCC) 01/2018   new onset    Basal cell carcinoma of back    Basal cell carcinoma of lip    Cerebral aneurysm    followed by [facility]   CHF (congestive heart failure) (HCC)    DDD (degenerative disc disease), lumbar    superior plate depression, L3 08/18/2014   Diabetes mellitus type II, controlled (HCC)    Diverticulosis    Dysrhythmia    Paroxysmal Supraventricular Tachycardia   Dysthymia    depression   GERD (gastroesophageal reflux disease)    History of falling 04/03/2022   History of meniscal tear    Humeral surgical neck fracture 05/16/2022   Hyperlipidemia    Hypertension    Hypokalemia 12/12/2022   Hypothyroidism    Late onset Alzheimer's disease with behavioral disturbance (HCC)    Leukocytosis 10/07/2020   Mild pulmonary hypertension (HCC)    Moderate asthma without complication 12/12/2022   Multifocal  pneumonia 12/09/2022   Overactive bladder    Peripheral vascular disease (HCC)    Puncture wound of forehead 06/11/2023   Seasonal allergic rhinitis    Severe sepsis (HCC) 02/07/2022   Sleep apnea     Medications:  (Not in a hospital admission)   Assessment: Pharmacy consulted to dose heparin  in this 89 year old female admitted with ACS/NSTEMI.  Pt was on Eliquis  5 mg PO BID PTA,  last dose on 5/21 @ 1900. CrCl = 49.3 ml/min   Goal of Therapy:  Heparin  level 0.3-0.7 units/ml aPTT 66 - 102 seconds Monitor platelets by anticoagulation protocol: Yes   Plan:  - no bolus due to recent Eliquis   - Start heparin  infusion at 900 units/hr - use aPTT to guide dosing until correlating with HL  - will draw aPTT 8 hrs after start of drip - will draw HL on 5/23 with AM labs - CBC daily   Chermaine Schnyder D 05/14/2024,4:26 AM

## 2024-05-14 NOTE — Assessment & Plan Note (Addendum)
 Lactic acidosis Possible COPD with acute bronchitis Leukocytosis with lactic acidosis likely secondary to acute respiratory distress with hypoxia Albuterol  as needed Get procalcitonin to evaluate for sepsis---<0.10

## 2024-05-14 NOTE — Assessment & Plan Note (Signed)
 Continue IV Lasix  Continue nitroglycerin  ointment Continue metoprolol  with hold parameters Had echo January 2025 with EF 60 to 65% and indeterminate diastolic parameters Daily weights with intake and output monitoring Will get echocardiogram to evaluate for wall motion abnormality

## 2024-05-14 NOTE — Assessment & Plan Note (Signed)
 On heparin  infusion so we will hold apixaban  Continue amiodarone  and metoprolol 

## 2024-05-14 NOTE — Telephone Encounter (Signed)
-----   Message from Belva Boyden sent at 05/13/2024  6:42 PM EDT ----- Regarding: FW: follow up for pt Perhaps we can get her into clinic with myself or APP Thx TGollan ----- Message ----- From: Hadassah Letters, MD Sent: 05/12/2024  12:15 PM EDT To: Devorah Fonder, MD Subject: follow up for pt                               Hi Dr. Gollan,  I wanted to check in and see when you wanted to see Alison Richardson again? I saw her today in clinic and she wasn't sure.   She recently had an episode of hypotension and had her ARB discontinued. She has been having some chest discomfort this week and needed nitroglycerin   x2 at home, EKG showing sinus brady otherwise very similar to prior. Currently doing amiodarone  200mg  and metop 25mg  (we reduced to 12.5mg  today), and prn torsemide .  Would you be able to check in with her soon?  Thank you! Hadassah Letters, MD

## 2024-05-14 NOTE — ED Notes (Signed)
 Removed nitroglycerin  paste from patient's chest

## 2024-05-14 NOTE — Progress Notes (Signed)
*  PRELIMINARY RESULTS* Echocardiogram 2D Echocardiogram has been performed.  Alison Richardson 05/14/2024, 2:47 PM

## 2024-05-14 NOTE — Progress Notes (Addendum)
 PROGRESS NOTE  Alison Richardson    DOB: [birth year], 89 y.o.  YNW:[id number]    Code Status: Limited: Do not attempt resuscitation (DNR) -DNR-LIMITED -Do Not Intubate/DNI    DOA: 05/14/2024   LOS: 0   Brief hospital course  Alison Richardson is a 89 y.o. female with medical history significant for dementia, HTN, DM, CHF, atrial fibrillation on Eliquis  and amiodarone , PAD, recently hospitalized from 5/16 to 5/17 with hypotension that improved with medication adjustment, who presented to the ED brought in by EMS from her facility with shortness of breath for which she received DuoNebs and Solu-Medrol  en route.    ED course: Tachypneic to the low 30s with elevated blood pressure of 153/105, hypoxic to 87% on 4 L when she arrived, subsequently transitioned to HFNC and then BiPAP Labs notable for: VBG on BiPAP FiO2 60% with pH 7.36 and PCO244  WBC of 24,000 with lactic acid 2.8 and negative respiratory viral panel Troponin 14--108 with BNP 290 Lipase and LFTs unremarkable   EKG: sinus at 70 with no acute ST-T wave changes Chest x-ray mild to moderate cardiogenic failure   Patient treated with IV Lasix  40 mg, Nitropaste 0.5 inch and was also administered fentanyl  50 mcg x 2 for work of breathing. She was empirically started on Rocephin  and azithromycin  Following troponin elevation she was administered chewable aspirin  324 and started on a heparin  infusion  Cardiology was consulted.  05/14/24 -guarded  Assessment & Plan  Principal Problem:   Acute on chronic diastolic CHF (congestive heart failure) (HCC) Active Problems:   NSTEMI (non-ST elevated myocardial infarction) (HCC)   CAD (coronary artery disease)   Acute respiratory failure with hypoxia (HCC)   Hypothyroidism   Leukocytosis   Paroxysmal atrial fibrillation (HCC)   Depression with anxiety   Cognitive impairment, mild, so stated   PAD (peripheral artery disease) (HCC)   DM type 2 (diabetes mellitus, type 2) (HCC)   Lactic  acidosis  Acute on chronic diastolic CHF (congestive heart failure) (HCC). Signs of pulmonary edema on imaging.  Continue IV Lasix  Continue nitroglycerin  ointment Continue metoprolol  with hold parameters Had echo January 2025 with EF 60 to 65% and indeterminate diastolic parameters - repeating echo - cardiology following Daily weights with intake and output monitoring   NSTEMI (non-ST elevated myocardial infarction) (HCC) CAD Troponin 14--108, without acute EKG changes or complaints of chest pain On heparin  infusion so will hold Eliquis  Continue pravastatin .  Continue metoprolol  as BP would tolerate    Acute respiratory failure with hypoxia (HCC) Uncertain etiology, no baseline O2 requirement Differential includes NSTEMI, acute bronchitis, possible acute PE. D-dimer 1.2. has been adherent with eliquis . Intermittently on bipap.  -Continue BiPAP and wean as tolerated Per report: patient does not want to be intubated and would want to transition to comfort care measures with as needed morphine /fentanyl  for air hunger if needed--she is agreeable to fentanyl /morphine  as needed but not yet ready for comfort care at the time of this notation - breathing treatments - diuresis as above - palliative consulted   Paroxysmal atrial fibrillation (HCC) On heparin  infusion so we will hold apixaban  Continue amiodarone  and metoprolol    Leukocytosis Lactic acidosis Possible COPD with acute bronchitis Leukocytosis with lactic acidosis likely secondary to acute respiratory distress with hypoxia Albuterol  as needed Get procalcitonin to evaluate for sepsis---<0.10   Hypothyroidism Continue levothyroxine    Depression with anxiety Continue venlafaxine    DM type 2 (diabetes mellitus, type 2) (HCC) Sliding scale insulin  coverage   PAD (peripheral artery  disease) (HCC) Continue pravastatin  Currently on aspirin    Cognitive impairment, mild, so stated Delirium precautions  There is no height  or weight on file to calculate BMI.  VTE ppx: heparin   Diet:     Diet   Diet NPO time specified   Consultants: Cardiology   Subjective 05/14/24    Pt on bipap and unable to communicate    Objective  Blood pressure (!) 148/89, pulse 65, temperature 97.9 F (36.6 C), temperature source Oral, resp. rate (!) 22, SpO2 96%. No intake or output data in the 24 hours ending 05/14/24 0755 There were no vitals filed for this visit.   Labs   I have personally reviewed the following labs and imaging studies CBC    Component Value Date/Time   WBC 24.4 (H) 05/14/2024 0034   RBC 4.74 05/14/2024 0034   HGB 12.3 05/14/2024 0034   HGB 11.2 04/13/2024 1357   HCT 40.4 05/14/2024 0034   HCT 35.7 04/13/2024 1357   PLT 405 (H) 05/14/2024 0034   PLT 419 04/13/2024 1357   MCV 85.2 05/14/2024 0034   MCV 85 04/13/2024 1357   MCH 25.9 (L) 05/14/2024 0034   MCHC 30.4 05/14/2024 0034   RDW 15.4 05/14/2024 0034   RDW 14.6 04/13/2024 1357   LYMPHSABS 4.5 (H) 05/14/2024 0034   LYMPHSABS 2.5 04/13/2024 1357   MONOABS 1.4 (H) 05/14/2024 0034   EOSABS 0.3 05/14/2024 0034   EOSABS 0.2 04/13/2024 1357   BASOSABS 0.1 05/14/2024 0034   BASOSABS 0.1 04/13/2024 1357      Latest Ref Rng & Units 05/14/2024   12:34 AM 05/12/2024   11:05 AM 05/09/2024    4:56 AM  BMP  Glucose 70 - 99 mg/dL 782  956  213   BUN 8 - 23 mg/dL 13  11  12    Creatinine 0.44 - 1.00 mg/dL 0.86  5.78  4.69   BUN/Creat Ratio 12 - 28  12    Sodium 135 - 145 mmol/L 139  144  144   Potassium 3.5 - 5.1 mmol/L 3.5  4.0  3.4   Chloride 98 - 111 mmol/L 107  106  112   CO2 22 - 32 mmol/L 19  19  24    Calcium  8.9 - 10.3 mg/dL 8.9  9.3  8.6     DG Chest Portable 1 View Result Date: 05/14/2024 CLINICAL DATA:  Dyspnea, hypoxia, CHF EXAM: PORTABLE CHEST 1 VIEW COMPARISON:  05/08/2024 FINDINGS: The lungs are symmetrically well expanded. Interval development of diffuse interstitial pulmonary edema, in keeping with changes of mild to moderate  cardiogenic failure. No pneumothorax or pleural effusion. Cardiac size is mildly enlarged, stable. No acute bone abnormality. IMPRESSION: 1. Mild to moderate cardiogenic failure. Electronically Signed   By: Worthy Heads M.D.   On: 05/14/2024 00:48    Disposition Plan & Communication  Patient status: Inpatient  Admitted From: Home Planned disposition location: TBD Anticipated discharge date: TBD pending clinical course  Family Communication: none at bedside    Author: Ree Candy, DO [facility] 05/14/2024, 7:55 AM   Available by Epic secure chat 7AM-7PM. If 7PM-7AM, please contact night-coverage.  [facility] contact information found on [email or web address].

## 2024-05-14 NOTE — Assessment & Plan Note (Signed)
 Continue venlafaxine

## 2024-05-14 NOTE — Assessment & Plan Note (Addendum)
 Uncertain etiology, no baseline O2 requirement Differential includes NSTEMI, acute bronchitis, possible acute PE Patient in respiratory distress, low O2 sats on supplemental oxygen, requiring BiPAP Treat each potential etiology as outlined in the respective problem Continue BiPAP and wean as tolerated Per discussion with daughter, patient does not want to be intubated and should she was not would want to transition to comfort care measures with as needed morphine /fentanyl  for air hunger--she is agreeable to fentanyl /morphine  as needed but not yet ready for comfort care at the time of this notation

## 2024-05-14 NOTE — Progress Notes (Signed)
 PT Cancellation Note  Patient Details Name: Alison Richardson MRN: [id number] DOB: April 13, 1933   Cancelled Treatment:    Reason Eval/Treat Not Completed: Medical issues which prohibited therapy Patient not medically ready. Will evaluate when appropriate.   Libra Gatz 05/14/2024, 2:09 PM

## 2024-05-14 NOTE — IPAL (Signed)
  Interdisciplinary Goals of Care Family Meeting   Date carried out: 05/14/2024  Location of the meeting: Bedside  Member's involved: Physician, Bedside Registered Nurse, and Family Member or next of kin  Durable Power of Attorney or acting medical decision maker: daughter and patient    Discussion: We discussed goals of care for Alison Richardson .   I have reviewed medical records including EPIC notes, labs and imaging, assessed the patient  to discuss major active diagnoses, plan of care, natural trajectory, prognosis, GOC, EOL wishes, disposition and options including Full code/DNI/DNR and the concept of comfort care if DNR is elected. Questions and concerns were addressed. They are  in agreement to continue current plan of care . Election for DNR/DNI status. Additionally, both patient and daughter states that if she does not respond to interventions and she should clinically deteriorate then we should transition to comfort care   Code status:   Code Status: Limited: Do not attempt resuscitation (DNR) -DNR-LIMITED -Do Not Intubate/DNI    Disposition: Continue current acute care  Time spent for the meeting: 31    Alison Pion, MD  05/14/2024, 4:59 AM

## 2024-05-14 NOTE — Telephone Encounter (Signed)
 Spoke to daughter patient is currently in hospital, will call to schedule.

## 2024-05-14 NOTE — Assessment & Plan Note (Signed)
 Continue levothyroxine 

## 2024-05-14 NOTE — Progress Notes (Signed)
   05/14/24 0500  Spiritual Encounters  Type of Visit Initial  Care provided to: Pt and family  Referral source Code page  Reason for visit Religious ritual  OnCall Visit Yes  Spiritual Framework  Presenting Themes Meaning/purpose/sources of inspiration  Community/Connection Family  Interventions  Spiritual Care Interventions Made Established relationship of care and support;Compassionate presence;Prayer  Intervention Outcomes  Outcomes Connection to spiritual care  Advance Directives (For Healthcare)  Does Patient Have a Medical Advance Directive? Yes   Chaplain provided compassionate presence, and prayer for the pt and family. Patient expressed they knew where they were going after death. Daughter of patient was having a hard time but grateful for the visit. Chaplain let them know someone would be available when needed, just let staff know to call.

## 2024-05-14 NOTE — ED Triage Notes (Addendum)
 Pt to ED via [facility] from [facility] [place] c/o SOB. Per EMS, pt had sudden onset of SOB. Hx of COPD, CHF. Does not wear oxygen at baseline. EMS placed pt on 4L [place], highest O2 sat was 89%. Pt diaphoretic on arrival to room. Pt was having CP prior to EMS arrival. Takes eliquis .  Ems gave 125mg  solumedrol and 2 duonebs

## 2024-05-14 NOTE — ED Notes (Signed)
 MD made aware that pt has been having episodes of diaphoresis, chest pain, and nausea/vomiting.

## 2024-05-14 NOTE — H&P (Addendum)
 History and Physical    Patient: Alison Richardson WUJ:[id number] DOB: 1933/07/23 DOA: 05/14/2024 DOS: the patient was seen and examined on 05/14/2024 PCP: Hadassah Letters, MD  Patient coming from: ALF/ILF  Chief Complaint:  Chief Complaint  Patient presents with   Shortness of Breath    HPI: Alison Richardson is a 89 y.o. female with medical history significant for dementia, HTN, DM, CHF, atrial fibrillation on Eliquis  and amiodarone , PAD, recently hospitalized from 5/16 to 5/17 with hypotension that improved with medication adjustment, who presented to the ED brought in by EMS from her facility with shortness of breath for which she received DuoNebs and Solu-Medrol  en route.  She has no history of COPD.  Daughter at bedside contributes to history.  Patient has had no chest pain, lower extremity pain or swelling and no cough or fever. ED course and data review: Tachypneic to the low 30s with elevated blood pressure of 153/105, requiring hypoxic to 87% on 4 L on which she arrived, subsequently transitioned to HFNC and then BiPAP Labs notable for: VBG on BiPAP FiO2 60% with pH 7.36 and PCO244  WBC of 24,000 with lactic acid 2.8 and negative respiratory viral panel Troponin 14--108 with BNP 290 Lipase and LFTs unremarkable  EKG,Personally viewed and interpreted showing sinus at 70 with no acute ST-T wave changes  Chest x-ray mild to moderate cardiogenic failure  Patient treated with IV Lasix  40 mg, Nitropaste 0.5 inch and was also administered fentanyl  50 mcg x 2 for work of breathing. She was empirically started on Rocephin  and azithromycin  Following troponin elevation she was administered chewable aspirin  324 and started on a heparin  infusion  Hospitalist consulted for admission.     Review of Systems: As mentioned in the history of present illness. All other systems reviewed and are negative.  Past Medical History:  Diagnosis Date   Acute on chronic congestive heart failure (HCC)  10/07/2020   Arrhythmia    Atrial fibrillation and flutter (HCC) 01/06/2019   Plavix  added to eliquis  10-22 after nstemi       Last Assessment & Plan:    Marked facial hematoma post fall but ct of neck and head reportedly normal. No ha or ataxia now but is using a walker. On eliquis  and plavix      Atrial flutter (HCC) 01/2018   new onset    Basal cell carcinoma of back    Basal cell carcinoma of lip    Cerebral aneurysm    followed by [facility]   CHF (congestive heart failure) (HCC)    DDD (degenerative disc disease), lumbar    superior plate depression, L3 08/18/2014   Diabetes mellitus type II, controlled (HCC)    Diverticulosis    Dysrhythmia    Paroxysmal Supraventricular Tachycardia   Dysthymia    depression   GERD (gastroesophageal reflux disease)    History of falling 04/03/2022   History of meniscal tear    Humeral surgical neck fracture 05/16/2022   Hyperlipidemia    Hypertension    Hypokalemia 12/12/2022   Hypothyroidism    Late onset Alzheimer's disease with behavioral disturbance (HCC)    Leukocytosis 10/07/2020   Mild pulmonary hypertension (HCC)    Moderate asthma without complication 12/12/2022   Multifocal pneumonia 12/09/2022   Overactive bladder    Peripheral vascular disease (HCC)    Puncture wound of forehead 06/11/2023   Seasonal allergic rhinitis    Severe sepsis (HCC) 02/07/2022   Sleep apnea    Past Surgical History:  Procedure  Laterality Date   APPENDECTOMY  1946   BASAL CELL CARCINOMA EXCISION     2006 and 2009 removed from back and lip   CARDIOVASCULAR STRESS TEST  2015   nuclear cardiac stress test negative for ischemia - Dr. Adelina Adu   CATARACT EXTRACTION, BILATERAL  2006   COLONOSCOPY  2014   COLONOSCOPY N/A 08/19/2020   Procedure: COLONOSCOPY;  Surgeon: Shane Darling, MD;  Location: [facility] ENDOSCOPY;  Service: Endoscopy;  Laterality: N/A;   ECTOPIC PREGNANCY SURGERY  1957   ESOPHAGOGASTRODUODENOSCOPY N/A 08/19/2020   Procedure:  ESOPHAGOGASTRODUODENOSCOPY (EGD);  Surgeon: Shane Darling, MD;  Location: [facility] ENDOSCOPY;  Service: Endoscopy;  Laterality: N/A;   INJECTION KNEE Left 05/2015   LEFT HEART CATH AND CORONARY ANGIOGRAPHY N/A 10/12/2021   Procedure: LEFT HEART CATH AND CORONARY ANGIOGRAPHY;  Surgeon: Michelle Aid, MD;  Location: [facility] INVASIVE CV LAB;  Service: Cardiovascular;  Laterality: N/A;   REPLACEMENT TOTAL KNEE Left 09/06/2016   Dr. Kita Perish   TONSILLECTOMY AND ADENOIDECTOMY  1953   TOTAL ABDOMINAL HYSTERECTOMY  1986   DU B   Social History:  reports that she has never smoked. She has never used smokeless tobacco. She reports that she does not currently use alcohol. She reports that she does not use drugs.  Allergies  Allergen Reactions   Pantoprazole  Nausea And Vomiting   Metformin And Related Diarrhea    Family History  Problem Relation Age of Onset   Hypertension Mother    Heart disease Father    CAD Father    Heart disease Sister    Diabetes Sister    Diabetes Paternal Uncle    Sleep apnea Son     Prior to Admission medications   Medication Sig Start Date End Date Taking? Authorizing Provider  albuterol  (PROVENTIL ) (2.5 MG/3ML) 0.083% nebulizer solution Take 3 mLs (2.5 mg total) by nebulization every 6 (six) hours as needed for wheezing or shortness of breath. 04/03/24   Aileen Alexanders, NP  amiodarone  (PACERONE ) 200 MG tablet Take 1 tablet (200 mg total) by mouth daily. 12/09/23   Gollan, Timothy J, MD  apixaban  (ELIQUIS ) 5 MG TABS tablet Take 1 tablet (5 mg total) by mouth 2 (two) times daily. 12/09/23   Gollan, Timothy J, MD  donepezil  (ARICEPT ) 10 MG tablet TAKE ONE TABLET (10 MG) BY MOUTH AT BEDTIME 04/01/24   Hadassah Letters, MD  gabapentin  (NEURONTIN ) 100 MG capsule Take 1 capsule (100 mg total) by mouth at bedtime. 09/10/23   Hadassah Letters, MD  levothyroxine  (SYNTHROID ) 88 MCG tablet TAKE 1 TABLET EVERY DAY ON EMPTY STOMACHWITH A GLASS OF WATER AT LEAST 30-60 MINBEFORE  BREAKFAST 10/31/23   Hadassah Letters, MD  metoprolol  succinate (TOPROL -XL) 25 MG 24 hr tablet Take 0.5 tablets (12.5 mg total) by mouth daily. Take with or immediately following a meal. 05/12/24   Hadassah Letters, MD  nitroGLYCERIN  (NITROSTAT ) 0.4 MG SL tablet Place 1 tablet (0.4 mg total) under the tongue every 5 (five) minutes as needed for chest pain. 12/09/23   Gollan, Timothy J, MD  potassium chloride  SA (KLOR-CON  M) 20 MEQ tablet Take 1 tablet (20 mEq total) by mouth daily as needed. Take it on the days you take Torsemide  05/09/24   Patel, Sona, MD  pravastatin  (PRAVACHOL ) 20 MG tablet Take 1 tablet (20 mg total) by mouth at bedtime. 12/09/23   Gollan, Timothy J, MD  Semaglutide ,0.25 or 0.5MG /DOS, (OZEMPIC , 0.25 OR 0.5 MG/DOSE,) 2 MG/1.5ML SOPN Inject 0.5 mg  into the skin once a week. 05/05/24   Aileen Alexanders, NP  Tiotropium Bromide Monohydrate  (SPIRIVA  RESPIMAT) 1.25 MCG/ACT AERS Inhale into the lungs.    [provider]  torsemide  (DEMADEX ) 20 MG tablet Take 1 tablet (20 mg total) by mouth daily as needed. For leg edema or sob 05/09/24   Patel, Sona, MD  venlafaxine  XR (EFFEXOR -XR) 75 MG 24 hr capsule TAKE 1 CAPSULE BY MOUTH ONCE DAILY. 02/06/24   Hadassah Letters, MD    Physical Exam: Vitals:   05/14/24 0315 05/14/24 0330 05/14/24 0347 05/14/24 0407  BP: 139/86 132/87    Pulse: 70 68 65   Resp: 17 (!) 21 (!) 26   Temp:    97.9 F (36.6 C)  TempSrc:    Oral  SpO2: 99% 98% 97%    Physical Exam Vitals and nursing note reviewed.  Constitutional:      General: She is not in acute distress.    Comments: On BiPAP, speaking through BiPAP, daughter at bedside  HENT:     Head: Normocephalic and atraumatic.  Cardiovascular:     Rate and Rhythm: Normal rate and regular rhythm.     Heart sounds: Normal heart sounds.  Pulmonary:     Effort: Tachypnea and accessory muscle usage present.     Breath sounds: Normal breath sounds.  Abdominal:     Palpations: Abdomen is soft.      Tenderness: There is no abdominal tenderness.  Neurological:     Mental Status: She is alert. Mental status is at baseline.     Labs on Admission: I have personally reviewed following labs and imaging studies  CBC: Recent Labs  Lab 05/08/24 1210 05/09/24 0456 05/14/24 0034  WBC 9.1 9.2 24.4*  NEUTROABS 5.7  --  18.0*  HGB 10.6* 11.3* 12.3  HCT 33.2* 35.3* 40.4  MCV 83.0 83.1 85.2  PLT 317 324 405*   Basic Metabolic Panel: Recent Labs  Lab 05/08/24 1210 05/09/24 0456 05/12/24 1105 05/14/24 0034  NA 140 144 144 139  K 3.3* 3.4* 4.0 3.5  CL 106 112* 106 107  CO2 23 24 19* 19*  GLUCOSE 111* 104* 169* 230*  BUN 21 12 11 13   CREATININE 0.91 0.63 0.91 0.75  CALCIUM  8.0* 8.6* 9.3 8.9  MG 1.8  --  2.1  --    GFR: Estimated Creatinine Clearance: 49.3 mL/min (by C-G formula based on SCr of 0.75 mg/dL). Liver Function Tests: Recent Labs  Lab 05/08/24 1210 05/14/24 0034  AST 24 49*  ALT 20 39  ALKPHOS 63 89  BILITOT 0.6 1.0  PROT 5.8* 7.2  ALBUMIN 3.4* 4.0   Recent Labs  Lab 05/14/24 0034  LIPASE 28   No results for input(s): "AMMONIA" in the last 168 hours. Coagulation Profile: No results for input(s): "INR", "PROTIME" in the last 168 hours. Cardiac Enzymes: No results for input(s): "CKTOTAL", "CKMB", "CKMBINDEX", "TROPONINI" in the last 168 hours. BNP (last 3 results) No results for input(s): "PROBNP" in the last 8760 hours. HbA1C: No results for input(s): "HGBA1C" in the last 72 hours. CBG: No results for input(s): "GLUCAP" in the last 168 hours. Lipid Profile: No results for input(s): "CHOL", "HDL", "LDLCALC", "TRIG", "CHOLHDL", "LDLDIRECT" in the last 72 hours. Thyroid  Function Tests: No results for input(s): "TSH", "T4TOTAL", "FREET4", "T3FREE", "THYROIDAB" in the last 72 hours. Anemia Panel: No results for input(s): "VITAMINB12", "FOLATE", "FERRITIN", "TIBC", "IRON", "RETICCTPCT" in the last 72 hours. Urine analysis:    Component Value Date/Time  COLORURINE STRAW (A) 05/08/2024 2104   APPEARANCEUR CLEAR (A) 05/08/2024 2104   APPEARANCEUR Clear 09/03/2023 1126   LABSPEC 1.008 05/08/2024 2104   PHURINE 7.0 05/08/2024 2104   GLUCOSEU NEGATIVE 05/08/2024 2104   HGBUR NEGATIVE 05/08/2024 2104   BILIRUBINUR NEGATIVE 05/08/2024 2104   BILIRUBINUR Negative 09/03/2023 1126   KETONESUR NEGATIVE 05/08/2024 2104   PROTEINUR NEGATIVE 05/08/2024 2104   NITRITE NEGATIVE 05/08/2024 2104   LEUKOCYTESUR NEGATIVE 05/08/2024 2104    Radiological Exams on Admission: DG Chest Portable 1 View Result Date: 05/14/2024 CLINICAL DATA:  Dyspnea, hypoxia, CHF EXAM: PORTABLE CHEST 1 VIEW COMPARISON:  05/08/2024 FINDINGS: The lungs are symmetrically well expanded. Interval development of diffuse interstitial pulmonary edema, in keeping with changes of mild to moderate cardiogenic failure. No pneumothorax or pleural effusion. Cardiac size is mildly enlarged, stable. No acute bone abnormality. IMPRESSION: 1. Mild to moderate cardiogenic failure. Electronically Signed   By: Worthy Heads M.D.   On: 05/14/2024 00:48   Data Reviewed for HPI: Relevant notes from primary care and specialist visits, past discharge summaries as available in EHR, including Care Everywhere. Prior diagnostic testing as pertinent to current admission diagnoses Updated medications and problem lists for reconciliation ED course, including vitals, labs, imaging, treatment and response to treatment Triage notes, nursing and pharmacy notes and ED provider's notes Notable results as noted above in HPI      Assessment and Plan: * Acute on chronic diastolic CHF (congestive heart failure) (HCC) Continue IV Lasix  Continue nitroglycerin  ointment Continue metoprolol  with hold parameters Had echo January 2025 with EF 60 to 65% and indeterminate diastolic parameters Daily weights with intake and output monitoring Will get echocardiogram to evaluate for wall motion abnormality  NSTEMI  (non-ST elevated myocardial infarction) (HCC) CAD Troponin 14--108, without acute EKG changes or complaints of chest pain Continue to trend troponin to peak On heparin  infusion so will hold Eliquis  Continue pravastatin .  Continue metoprolol  as BP would tolerate Cardiology consult   Acute respiratory failure with hypoxia (HCC) Uncertain etiology, no baseline O2 requirement Differential includes NSTEMI, acute bronchitis, possible acute PE Patient in respiratory distress, low O2 sats on supplemental oxygen, requiring BiPAP Treat each potential etiology as outlined in the respective problem Continue BiPAP and wean as tolerated Per discussion with daughter, patient does not want to be intubated and should she was not would want to transition to comfort care measures with as needed morphine /fentanyl  for air hunger--she is agreeable to fentanyl /morphine  as needed but not yet ready for comfort care at the time of this notation  Paroxysmal atrial fibrillation (HCC) On heparin  infusion so we will hold apixaban  Continue amiodarone  and metoprolol   Leukocytosis Lactic acidosis Possible COPD with acute bronchitis Leukocytosis with lactic acidosis likely secondary to acute respiratory distress with hypoxia Albuterol  as needed Get procalcitonin to evaluate for sepsis---<0.10  Hypothyroidism Continue levothyroxine   Depression with anxiety Continue venlafaxine   DM type 2 (diabetes mellitus, type 2) (HCC) Sliding scale insulin  coverage  PAD (peripheral artery disease) (HCC) Continue pravastatin  Currently on aspirin   Cognitive impairment, mild, so stated Delirium precautions        DVT prophylaxis: Heparin  infusion  Consults: [facility] cardiology  Advance Care Planning:   Code Status: Limited: Do not attempt resuscitation (DNR) -DNR-LIMITED -Do Not Intubate/DNI    Family Communication: daughter at bedside  Disposition Plan: Back to previous home environment  Severity of  Illness: The appropriate patient status for this patient is INPATIENT. Inpatient status is judged to be reasonable and necessary in order  to provide the required intensity of service to ensure the patient's safety. The patient's presenting symptoms, physical exam findings, and initial radiographic and laboratory data in the context of their chronic comorbidities is felt to place them at high risk for further clinical deterioration. Furthermore, it is not anticipated that the patient will be medically stable for discharge from the hospital within 2 midnights of admission.   * I certify that at the point of admission it is my clinical judgment that the patient will require inpatient hospital care spanning beyond 2 midnights from the point of admission due to high intensity of service, high risk for further deterioration and high frequency of surveillance required.* CRITICAL CARE Performed by: Lanetta Pion   Total critical care time: 75 minutes  Critical care time was exclusive of separately billable procedures and treating other patients.  Critical care was necessary to treat or prevent imminent or life-threatening deterioration.  Critical care was time spent personally by me on the following activities: development of treatment plan with patient and/or surrogate as well as nursing, discussions with consultants, evaluation of patient's response to treatment, examination of patient, obtaining history from patient or surrogate, ordering and performing treatments and interventions, ordering and review of laboratory studies, ordering and review of radiographic studies, pulse oximetry and re-evaluation of patient's condition.  Author: Lanetta Pion, MD 05/14/2024 4:56 AM  For on call review [email or web address].

## 2024-05-14 NOTE — Consult Note (Signed)
 Cardiology Consultation:   Patient ID: Alison Richardson; [id number]; [birth year]   Admit date: 05/14/2024 Date of Consult: 05/14/2024  Primary Care Provider: Hadassah Letters, MD Primary Cardiologist: Alison Richardson Primary Electrophysiologist:  None   Patient Profile:   Alison Richardson is a 89 y.o. female with a hx of CAD medically managed, persistent A-fib, HFimpEF, valvular heart disease, DM2, HTN, HLD, and hypothyroidism who is being seen today for the evaluation of elevated troponin at the request of Dr. Vallarie Richardson.  History of Present Illness:   Alison Richardson was previously followed by [facility] cardiology.  See remote imaging outlined below.  She underwent LHC in 09/2021 that showed ostial to proximal RCA stenosis of 99%, distal RCA stenosis 65%, RPDA stenosis 60%, D1 stenosis 99%, ostial LCx to proximal LCx 25% stenosis, and ostial LAD to proximal LAD 25% stenosis.  LV systolic function estimated at 35 to 45%.  Given complex lesions that were not ideal for PCI, medical management was recommended.  Echo in 10/2021 showed an EF of 45 to 50%, no regional wall motion abnormalities, normal LV diastolic function parameters, normal RV systolic function and ventricular cavity size, mild mitral regurgitation.  Follow-up echoes have demonstrated normalization of LV systolic function as outlined below.  She established care with Dr. Gollan in 11/2023.  Echo in 12/2023 showed an EF of 60 to 65%, no regional wall motion abnormalities, normal RV systolic function and ventricular cavity size, mildly elevated PASP estimated at 40.8 mmHg, mildly to moderately dilated left atrium, mild to moderate mitral regurgitation, mild to moderate tricuspid regurgitation, and an estimated right atrial pressure of 3 mmHg.  She was admitted to [facility] in 03/2024 with progressive dyspnea and treated for acute hypoxic respiratory failure with sepsis secondary to pneumonia.  She was seen at her PCPs office on 05/05/2024 for routine follow-up.   BP was soft at 93/57.  Valsartan  was discontinued.  She was started on Ozempic .  She was recently admitted from 5/16 through 05/09/2024 with continued lightheadedness and dizziness with positional changes.  Blood pressure 101/59.  High-sensitivity troponin negative x 2.  Chest x-ray with bibasilar linear opacities left greater than right felt to favor atelectasis with stable cardiomegaly.  She received IV fluids with recommendation to discontinue Cardizem .  With uptrending BP home metoprolol  was reinitiated.  She followed up with her PCP on 05/12/2024 and reported episodes of chest discomfort.  Blood pressure had improved and was 123/73.  She did feel like her abdomen was distended/bloated.  She was advised to take lower dose Toprol -XL 12.5 mg daily and torsemide  for weight gain.  She was continued on Ozempic .  She was admitted to [facility] on 05/13/2024 with development of sudden onset of shortness of breath on the evening of 5/21 when laying down.  She was without frank chest pain.  No palpitations.  No fevers or chills.  No significant lower extremity swelling.  She did feel like her abdomen was more distended.  Upon EMS arrival she was given Solu-Medrol  and DuoNebs.  She was hypoxic at 87% on 4 L of supplemental oxygen.  In this setting she was transition to HFNC followed by BiPAP.  High-sensitivity troponin 14 with a delta troponin of 108.  D-dimer 1.2.  Lactic acid 2.8 trending to 3.1.  BNP 290.  WBC 24.4, Hgb 12.3.  PCT less than 0.1.  COVID, influenza, and RSV negative.  Chest x-ray with interval development of interstitial pulmonary edema and stable cardiomegaly.  In the ED she was given nitro placed and  subsequently transition to nitro drip with as well as IV Lasix  40 mg x 2, nebulizer, IV Rocephin  and azithromycin , and fentanyl .  She was placed on a heparin  drip.  She reported that her PCP told her to increase one of her medications to 2 tabs in the morning and 2 tabs in the evening, though she cannot fully  recall which medication.  She did mention that she believed it was "Eliquis ."  At time of cardiology consult she feels like her respiratory status is improving, remains on BiPAP.  Remains without symptoms of chest pain.  No lower extremity swelling.  Patient and daughter feel like her abdomen has been more distended.  Hemodynamically stable.    Past Medical History:  Diagnosis Date   Acute on chronic congestive heart failure (HCC) 10/07/2020   Arrhythmia    Atrial fibrillation and flutter (HCC) 01/06/2019   Plavix  added to eliquis  10-22 after nstemi       Last Assessment & Plan:    Marked facial hematoma post fall but ct of neck and head reportedly normal. No ha or ataxia now but is using a walker. On eliquis  and plavix      Atrial flutter (HCC) 01/2018   new onset    Basal cell carcinoma of back    Basal cell carcinoma of lip    Cerebral aneurysm    followed by [facility]   CHF (congestive heart failure) (HCC)    DDD (degenerative disc disease), lumbar    superior plate depression, L3 08/18/2014   Diabetes mellitus type II, controlled (HCC)    Diverticulosis    Dysrhythmia    Paroxysmal Supraventricular Tachycardia   Dysthymia    depression   GERD (gastroesophageal reflux disease)    History of falling 04/03/2022   History of meniscal tear    Humeral surgical neck fracture 05/16/2022   Hyperlipidemia    Hypertension    Hypokalemia 12/12/2022   Hypothyroidism    Late onset Alzheimer's disease with behavioral disturbance (HCC)    Leukocytosis 10/07/2020   Mild pulmonary hypertension (HCC)    Moderate asthma without complication 12/12/2022   Multifocal pneumonia 12/09/2022   Overactive bladder    Peripheral vascular disease (HCC)    Puncture wound of forehead 06/11/2023   Seasonal allergic rhinitis    Severe sepsis (HCC) 02/07/2022   Sleep apnea     Past Surgical History:  Procedure Laterality Date   APPENDECTOMY  1946   BASAL CELL CARCINOMA EXCISION     2006 and 2009  removed from back and lip   CARDIOVASCULAR STRESS TEST  2015   nuclear cardiac stress test negative for ischemia - Dr. Adelina Adu   CATARACT EXTRACTION, BILATERAL  2006   COLONOSCOPY  2014   COLONOSCOPY N/A 08/19/2020   Procedure: COLONOSCOPY;  Surgeon: Shane Darling, MD;  Location: [facility] ENDOSCOPY;  Service: Endoscopy;  Laterality: N/A;   ECTOPIC PREGNANCY SURGERY  1957   ESOPHAGOGASTRODUODENOSCOPY N/A 08/19/2020   Procedure: ESOPHAGOGASTRODUODENOSCOPY (EGD);  Surgeon: Shane Darling, MD;  Location: [facility] ENDOSCOPY;  Service: Endoscopy;  Laterality: N/A;   INJECTION KNEE Left 05/2015   LEFT HEART CATH AND CORONARY ANGIOGRAPHY N/A 10/12/2021   Procedure: LEFT HEART CATH AND CORONARY ANGIOGRAPHY;  Surgeon: Michelle Aid, MD;  Location: [facility] INVASIVE CV LAB;  Service: Cardiovascular;  Laterality: N/A;   REPLACEMENT TOTAL KNEE Left 09/06/2016   Dr. Kita Perish   TONSILLECTOMY AND ADENOIDECTOMY  1953   TOTAL ABDOMINAL HYSTERECTOMY  1986   DU B  Home Meds: Prior to Admission medications   Medication Sig Start Date End Date Taking? Authorizing Provider  albuterol  (PROVENTIL ) (2.5 MG/3ML) 0.083% nebulizer solution Take 3 mLs (2.5 mg total) by nebulization every 6 (six) hours as needed for wheezing or shortness of breath. 04/03/24  Yes Aileen Alexanders, NP  amiodarone  (PACERONE ) 200 MG tablet Take 1 tablet (200 mg total) by mouth daily. 12/09/23  Yes Gollan, Timothy J, MD  apixaban  (ELIQUIS ) 5 MG TABS tablet Take 1 tablet (5 mg total) by mouth 2 (two) times daily. 12/09/23  Yes Gollan, Timothy J, MD  donepezil  (ARICEPT ) 10 MG tablet TAKE ONE TABLET (10 MG) BY MOUTH AT BEDTIME 04/01/24  Yes Alison Letters, MD  gabapentin  (NEURONTIN ) 100 MG capsule Take 1 capsule (100 mg total) by mouth at bedtime. 09/10/23  Yes Alison Letters, MD  levothyroxine  (SYNTHROID ) 88 MCG tablet TAKE 1 TABLET EVERY DAY ON EMPTY STOMACHWITH A GLASS OF WATER AT LEAST 30-60 MINBEFORE BREAKFAST 10/31/23  Yes Alison Letters, MD  metoprolol  succinate (TOPROL -XL) 25 MG 24 hr tablet Take 0.5 tablets (12.5 mg total) by mouth daily. Take with or immediately following a meal. 05/12/24  Yes Alison Letters, MD  nitroGLYCERIN  (NITROSTAT ) 0.4 MG SL tablet Place 1 tablet (0.4 mg total) under the tongue every 5 (five) minutes as needed for chest pain. 12/09/23  Yes Gollan, Timothy J, MD  potassium chloride  SA (KLOR-CON  M) 20 MEQ tablet Take 1 tablet (20 mEq total) by mouth daily as needed. Take it on the days you take Torsemide  05/09/24  Yes Patel, Sona, MD  pravastatin  (PRAVACHOL ) 20 MG tablet Take 1 tablet (20 mg total) by mouth at bedtime. 12/09/23  Yes Gollan, Timothy J, MD  Semaglutide ,0.25 or 0.5MG /DOS, (OZEMPIC , 0.25 OR 0.5 MG/DOSE,) 2 MG/1.5ML SOPN Inject 0.5 mg into the skin once a week. 05/05/24  Yes Aileen Alexanders, NP  Tiotropium Bromide Monohydrate  (SPIRIVA  RESPIMAT) 1.25 MCG/ACT AERS Inhale into the lungs.   Yes [provider]  torsemide  (DEMADEX ) 20 MG tablet Take 1 tablet (20 mg total) by mouth daily as needed. For leg edema or sob 05/09/24  Yes Patel, Sona, MD  venlafaxine  XR (EFFEXOR -XR) 75 MG 24 hr capsule TAKE 1 CAPSULE BY MOUTH ONCE DAILY. 02/06/24  Yes Alison Letters, MD    Inpatient Medications: Scheduled Meds:  amiodarone   200 mg Oral Daily   [START ON 05/15/2024] aspirin  EC  81 mg Oral Daily   donepezil   10 mg Oral QHS   furosemide   40 mg Intravenous BID   levothyroxine   88 mcg Oral Q0600   pravastatin   20 mg Oral QHS   venlafaxine  XR  75 mg Oral Daily   Continuous Infusions:  heparin  900 Units/hr (05/14/24 0536)   nitroGLYCERIN  5 mcg/min (05/14/24 0540)   promethazine (PHENERGAN) injection (IM or IVPB)     PRN Meds: acetaminophen , albuterol , morphine  injection, ondansetron  (ZOFRAN ) IV, promethazine (PHENERGAN) injection (IM or IVPB)  Allergies:   Allergies  Allergen Reactions   Pantoprazole  Nausea And Vomiting   Metformin And Related Diarrhea    Social History:   Social  History   Socioeconomic History   Marital status: Widowed    Spouse name: Not on file   Number of children: 5   Years of education: Not on file   Highest education level: Not on file  Occupational History   Not on file  Tobacco Use   Smoking status: Never   Smokeless tobacco: Never  Vaping Use   Vaping  status: Never Used  Substance and Sexual Activity   Alcohol use: Not Currently   Drug use: Never   Sexual activity: Not Currently  Other Topics Concern   Not on file  Social History Narrative   Not on file   Social Drivers of Health   Financial Resource Strain: Low Risk  (04/09/2023)   Overall Financial Resource Strain (CARDIA)    Difficulty of Paying Living Expenses: Not hard at all  Food Insecurity: No Food Insecurity (03/26/2024)   Hunger Vital Sign    Worried About Running Out of Food in the Last Year: Never true    Ran Out of Food in the Last Year: Never true  Transportation Needs: No Transportation Needs (03/26/2024)   PRAPARE - Administrator, Civil Service (Medical): No    Lack of Transportation (Non-Medical): No  Physical Activity: Insufficiently Active (04/09/2023)   Exercise Vital Sign    Days of Exercise per Week: 4 days    Minutes of Exercise per Session: 20 min  Stress: No Stress Concern Present (04/09/2023)   [employer] of Occupational Health - Occupational Stress Questionnaire    Feeling of Stress : Not at all  Social Connections: Socially Isolated (03/26/2024)   Social Connection and Isolation Panel [NHANES]    Frequency of Communication with Friends and Family: More than three times a week    Frequency of Social Gatherings with Friends and Family: More than three times a week    Attends Religious Services: Never    Database administrator or Organizations: No    Attends Banker Meetings: Never    Marital Status: Widowed  Intimate Partner Violence: Not At Risk (03/26/2024)   Humiliation, Afraid, Rape, and Kick questionnaire     Fear of Current or Ex-Partner: No    Emotionally Abused: No    Physically Abused: No    Sexually Abused: No     Family History:   Family History  Problem Relation Age of Onset   Hypertension Mother    Heart disease Father    CAD Father    Heart disease Sister    Diabetes Sister    Diabetes Paternal Uncle    Sleep apnea Son     ROS:  Review of Systems  Constitutional:  Positive for malaise/fatigue. Negative for chills, diaphoresis, fever and weight loss.  HENT:  Negative for congestion.   Eyes:  Negative for discharge and redness.  Respiratory:  Positive for shortness of breath. Negative for cough, hemoptysis, sputum production and wheezing.   Cardiovascular:  Negative for chest pain, palpitations, orthopnea, claudication, leg swelling and PND.  Gastrointestinal:  Negative for abdominal pain, blood in stool, heartburn, melena, nausea and vomiting.  Genitourinary:  Negative for hematuria.  Musculoskeletal:  Negative for falls and myalgias.  Skin:  Negative for rash.  Neurological:  Negative for dizziness, tingling, tremors, sensory change, speech change, focal weakness, loss of consciousness and weakness.  Endo/Heme/Allergies:  Does not bruise/bleed easily.  Psychiatric/Behavioral:  Negative for substance abuse. The patient is not nervous/anxious.   All other systems reviewed and are negative.     Physical Exam/Data:   Vitals:   05/14/24 0845 05/14/24 0955 05/14/24 1000 05/14/24 1015  BP: 139/79  134/81 128/75  Pulse: 60  63 (!) 59  Resp: 17  20 20   Temp:  (!) 96.5 F (35.8 C)    TempSrc:  Axillary    SpO2: 94%  97% 99%   No intake or output data in  the 24 hours ending 05/14/24 1027 There were no vitals filed for this visit. There is no height or weight on file to calculate BMI.   Physical Exam: General: Well developed, well nourished, in no acute distress. Head: Normocephalic, atraumatic, sclera non-icteric, no xanthomas, nares without discharge.  Neck: Negative  for carotid bruits. JVD difficult to assess secondary to respiratory support apparatus. Lungs: Diffuse rhonchorous breath sounds bilaterally with BiPAP in place. Heart: RRR with S1 S2. No murmurs, rubs, or gallops appreciated. Abdomen: Soft, non-tender, non-distended with normoactive bowel sounds. No hepatomegaly. No rebound/guarding. No obvious abdominal masses. Msk:  Strength and tone appear normal for age. Extremities: No clubbing or cyanosis. No edema. Distal pedal pulses are 2+ and equal bilaterally. Neuro: Alert, confused at times. No facial asymmetry. No focal deficit. Moves all extremities spontaneously. Psych:  Responds to questions appropriately with a normal affect.   EKG:  The EKG was personally reviewed and demonstrates: NSR, 70 bpm, first-degree AV block, nonspecific IVCD Telemetry:  Telemetry was personally reviewed and demonstrates: SR with PACs, 60s to 70s bpm  Weights: There were no vitals filed for this visit.  Relevant CV Studies:  2D echo 12/31/2023: 1. Left ventricular ejection fraction, by estimation, is 60 to 65%. The  left ventricle has normal function. The left ventricle has no regional  wall motion abnormalities. Left ventricular diastolic parameters are  indeterminate.   2. Right ventricular systolic function is normal. The right ventricular  size is normal. There is mildly elevated pulmonary artery systolic  pressure. The estimated right ventricular systolic pressure is 40.8 mmHg.   3. Left atrial size was mild to moderately dilated.   4. The mitral valve is normal in structure. Mild to moderate mitral valve  regurgitation. No evidence of mitral stenosis.   5. Tricuspid valve regurgitation is mild to moderate.   6. The aortic valve has an indeterminant number of cusps. Aortic valve  regurgitation is not visualized. No aortic stenosis is present.   7. The inferior vena cava is normal in size with greater than 50%  respiratory variability, suggesting right  atrial pressure of 3 mmHg.  __________  2D echo 04/21/2023: 1. Left ventricular ejection fraction, by estimation, is 55 to 60%. Left  ventricular ejection fraction by PLAX is 67 %. The left ventricle has  normal function. The left ventricle has no regional wall motion  abnormalities. Left ventricular diastolic  parameters are indeterminate.   2. Right ventricular systolic function is normal. The right ventricular  size is normal. There is mildly elevated pulmonary artery systolic  pressure. The estimated right ventricular systolic pressure is 38.3 mmHg.   3. The mitral valve is normal in structure. Moderate mitral valve  regurgitation. No evidence of mitral stenosis.   4. Tricuspid valve regurgitation is moderate to severe.   5. The aortic valve is tricuspid. Aortic valve regurgitation is not  visualized. No aortic stenosis is present.   6. The inferior vena cava is normal in size with greater than 50%  respiratory variability, suggesting right atrial pressure of 3 mmHg.  __________  2D echo 12/11/2022: 1. Left ventricular ejection fraction, by estimation, is 50 to 55%. The  left ventricle has low normal function. The left ventricle has no regional  wall motion abnormalities. Left ventricular diastolic parameters were  normal.   2. Right ventricular systolic function is normal. The right ventricular  size is normal.   3. The mitral valve is normal in structure. Mild mitral valve  regurgitation. No evidence of  mitral stenosis.   4. The aortic valve is normal in structure. Aortic valve regurgitation is  not visualized. No aortic stenosis is present.   5. The inferior vena cava is normal in size with greater than 50%  respiratory variability, suggesting right atrial pressure of 3 mmHg.  __________  2D echo 10/31/2021: 1. Left ventricular ejection fraction, by estimation, is 45 to 50%. The  left ventricle has mildly decreased function. The left ventricle has no  regional wall motion  abnormalities. Left ventricular diastolic parameters  were normal.   2. Right ventricular systolic function is normal. The right ventricular  size is normal.   3. The mitral valve is normal in structure. Mild mitral valve  regurgitation. No evidence of mitral stenosis.   4. The aortic valve is normal in structure. Aortic valve regurgitation is  not visualized. No aortic stenosis is present.   5. The inferior vena cava is normal in size with greater than 50%  respiratory variability, suggesting right atrial pressure of 3 mmHg.  __________  LHC 10/12/2021:   Ost RCA to Prox RCA lesion is 99% stenosed.   Dist RCA lesion is 65% stenosed.   RPDA lesion is 60% stenosed.   1st Diag lesion is 99% stenosed.   Ost Cx to Prox Cx lesion is 25% stenosed.   Ost LAD to Prox LAD lesion is 25% stenosed.   There is mild left ventricular systolic dysfunction.   LV end diastolic pressure is mildly elevated.   The left ventricular ejection fraction is 35-45% by visual estimate.   89 year old female with known chronic nonvalvular atrial fibrillation hypertension hyperlipidemia with acute non-ST elevation myocardial infarction   Cardiac catheterization showing mild inferior hypokinesis and apical hypokinesis with ejection fraction of 40 to 45%   Severe and/or critical stenosis of ostial right coronary artery and moderate atherosclerosis of distal right coronary artery and PDA complex in nature with good collaterals from left anterior descending artery Mild atherosclerosis of left anterior descending artery and circumflex artery   After discussion with cardiovascular team would best be medically manage at this time due to complex lesions and not ideal for PCI and stent placement   Plan Isosorbide  for better collateral flow 2.  Increase beta-blocker for better heart rate control of atrial fibrillation with a goal heart rate between 60 and 90 bpm 3.  Reinstate anticoagulation for further risk reduction and  stroke with atrial fibrillation 4.  Single antiplatelet therapy with Plavix  5.  Hypertension control and high intensity cholesterol therapy 6.  Cardiac rehabilitation __________  2D echo 10/07/2020: 1. Left ventricular ejection fraction, by estimation, is 60 to 65%. The  left ventricle has normal function. The left ventricle has no regional  wall motion abnormalities. Left ventricular diastolic parameters are  consistent with Grade I diastolic  dysfunction (impaired relaxation).   2. Right ventricular systolic function is normal. The right ventricular  size is normal.   3. The mitral valve is grossly normal. Mild mitral valve regurgitation.   4. The aortic valve is normal in structure. Aortic valve regurgitation is  trivial.  __________  2D echo 09/29/2019: 1. Left ventricular ejection fraction, by visual estimation, is 60 to  65%. The left ventricle has normal function. Left ventricular septal wall  thickness was mildly increased. Mildly increased left ventricular  posterior wall thickness. There is mildly  increased left ventricular hypertrophy.   2. Global right ventricle has normal systolic function.The right  ventricular size is mildly enlarged. No increase in right ventricular  wall  thickness.   3. Left atrial size was mildly dilated.   4. Right atrial size was normal.   5. The mitral valve is grossly normal. Mild mitral valve regurgitation.   6. The tricuspid valve is grossly normal. Tricuspid valve regurgitation  is mild.   7. The aortic valve is tricuspid Aortic valve regurgitation was not  visualized by color flow Doppler.   8. The pulmonic valve was not well visualized. Pulmonic valve  regurgitation is trivial by color flow Doppler.   9. Moderately elevated pulmonary artery systolic pressure.  10. No shunts by color flow doppler.     Laboratory Data:  Chemistry Recent Labs  Lab 05/08/24 1210 05/09/24 0456 05/12/24 1105 05/14/24 0034  NA 140 144 144 139  K  3.3* 3.4* 4.0 3.5  CL 106 112* 106 107  CO2 23 24 19* 19*  GLUCOSE 111* 104* 169* 230*  BUN 21 12 11 13   CREATININE 0.91 0.63 0.91 0.75  CALCIUM  8.0* 8.6* 9.3 8.9  GFRNONAA >60 >60  --  >60  ANIONGAP 11 8  --  13    Recent Labs  Lab 05/08/24 1210 05/14/24 0034  PROT 5.8* 7.2  ALBUMIN 3.4* 4.0  AST 24 49*  ALT 20 39  ALKPHOS 63 89  BILITOT 0.6 1.0   Hematology Recent Labs  Lab 05/08/24 1210 05/09/24 0456 05/14/24 0034  WBC 9.1 9.2 24.4*  RBC 4.00 4.25 4.74  HGB 10.6* 11.3* 12.3  HCT 33.2* 35.3* 40.4  MCV 83.0 83.1 85.2  MCH 26.5 26.6 25.9*  MCHC 31.9 32.0 30.4  RDW 15.1 15.2 15.4  PLT 317 324 405*   Cardiac EnzymesNo results for input(s): "TROPONINI" in the last 168 hours. No results for input(s): "TROPIPOC" in the last 168 hours.  BNP Recent Labs  Lab 05/14/24 0034  BNP 290.1*    DDimer  Recent Labs  Lab 05/14/24 0423  DDIMER 1.20*    Radiology/Studies:  DG Chest Portable 1 View Result Date: 05/14/2024 IMPRESSION: 1. Mild to moderate cardiogenic failure. Electronically Signed   By: Worthy Heads M.D.   On: 05/14/2024 00:48    Assessment and Plan:   1.  Acute hypoxic respiratory failure: - Sudden onset of dyspnea on the evening of 5/21 - Unlikely primary cardiac in etiology unless there is a significant development of new cardiomyopathy by echo -Suspect her degree of hypoxic respiratory failure is pulmonary in etiology with component of volume overload, cannot exclude some degree of flash pulmonary edema if her blood pressure spiked while at home following discontinuation of multi hypertensive medications - Repeat echo to evaluate for new cardiomyopathy or wall motion abnormality - D-dimer elevated at 1.2, recommend evaluation for PE (presumably unlikely as long as she has been adherent to apixaban .  CT of the chest may also help guide ongoing pharmacotherapy, and to evaluate for possible amiodarone  toxicity), will defer to primary service - Wean BiPAP  as tolerated per primary service  2.  CAD involving the native coronary arteries with elevated high-sensitivity troponin: - Never with chest pain - Cycle troponin to peak - On heparin  drip for now until it is clear she will not require inpatient invasive ischemic testing, last dose of apixaban  on the evening of 5/21, no emergent indication for cardiac cath at this time - PTA aspirin  and statin - Obtain echo  3.  Acute on chronic HFimpEF: - EF previously 35 to 45% by LV gram in 09/2021 and 45 to 50% by echo in 10/2021,  subsequently normalized -Agree with IV furosemide  40 mg twice daily for now with close monitoring of urine output and renal function - Not currently on beta-blocker (previously bradycardic in the 40s bpm at office visit in 11/2023 leading to decrease of beta-blocker dose to 50 mg at that time) - No longer on valsartan  due to hypotension that has subsequently improved - Escalate GDMT as indicated and able  3.  Persistent A-fib: - Maintaining sinus rhythm on amiodarone  200 mg daily -TSH normal in 03/2023, update - AST mildly elevated this admission with normal ALT, continue with periodic monitoring - CHA2DS2-VASc at least 7 (CHF, HTN, age x 2, DM, vascular disease, sex category) - Last dose of apixaban  on the evening of 5/21 - IV heparin  while admitted until it is clear she will not require invasive testing  4.  Valvular heart disease: - Echo in 12/2023 showed mild to moderate mitral and tricuspid regurgitation - Trend on echo as outlined above     For questions or updates, please contact [facility] Please consult [email or web address] for contact info under Cardiology/STEMI.   Signed, Varney Gentleman, PA-C [facility] Pager: [phone] 05/14/2024, 10:27 AM

## 2024-05-15 ENCOUNTER — Other Ambulatory Visit (HOSPITAL_BASED_OUTPATIENT_CLINIC_OR_DEPARTMENT_OTHER): Payer: Self-pay

## 2024-05-15 ENCOUNTER — Encounter: Payer: Self-pay | Admitting: Nurse Practitioner

## 2024-05-15 ENCOUNTER — Inpatient Hospital Stay

## 2024-05-15 DIAGNOSIS — Z7189 Other specified counseling: Secondary | ICD-10-CM | POA: Diagnosis not present

## 2024-05-15 DIAGNOSIS — R0789 Other chest pain: Secondary | ICD-10-CM

## 2024-05-15 DIAGNOSIS — I5043 Acute on chronic combined systolic (congestive) and diastolic (congestive) heart failure: Secondary | ICD-10-CM

## 2024-05-15 DIAGNOSIS — R0602 Shortness of breath: Secondary | ICD-10-CM

## 2024-05-15 DIAGNOSIS — Z9981 Dependence on supplemental oxygen: Secondary | ICD-10-CM

## 2024-05-15 DIAGNOSIS — I5033 Acute on chronic diastolic (congestive) heart failure: Secondary | ICD-10-CM | POA: Diagnosis not present

## 2024-05-15 LAB — BASIC METABOLIC PANEL WITH GFR
Anion gap: 10 (ref 5–15)
BUN: 28 mg/dL — ABNORMAL HIGH (ref 8–23)
CO2: 22 mmol/L (ref 22–32)
Calcium: 7.8 mg/dL — ABNORMAL LOW (ref 8.9–10.3)
Chloride: 104 mmol/L (ref 98–111)
Creatinine, Ser: 0.9 mg/dL (ref 0.44–1.00)
GFR, Estimated: >60 mL/min (ref >60)
Glucose, Bld: 185 mg/dL — ABNORMAL HIGH (ref 70–99)
Potassium: 3.7 mmol/L (ref 3.5–5.1)
Sodium: 136 mmol/L (ref 135–145)

## 2024-05-15 LAB — CBC
HCT: 33.1 % — ABNORMAL LOW (ref 36.0–46.0)
Hemoglobin: 10 g/dL — ABNORMAL LOW (ref 12.0–15.0)
MCH: 26 pg (ref 26.0–34.0)
MCHC: 30.2 g/dL (ref 30.0–36.0)
MCV: 86 fL (ref 80.0–100.0)
Platelets: 313 10*3/uL (ref 150–400)
RBC: 3.85 MIL/uL — ABNORMAL LOW (ref 3.87–5.11)
RDW: 15.7 % — ABNORMAL HIGH (ref 11.5–15.5)
WBC: 21.4 10*3/uL — ABNORMAL HIGH (ref 4.0–10.5)
nRBC: 0 % (ref 0.0–0.2)

## 2024-05-15 LAB — GLUCOSE, CAPILLARY
Glucose-Capillary: 175 mg/dL — ABNORMAL HIGH (ref 70–99)
Glucose-Capillary: 309 mg/dL — ABNORMAL HIGH (ref 70–99)
Glucose-Capillary: 254 mg/dL — ABNORMAL HIGH (ref 70–99)

## 2024-05-15 LAB — HEPARIN LEVEL (UNFRACTIONATED): Heparin Unfractionated: >1.1 [IU]/mL — ABNORMAL HIGH (ref 0.30–0.70)

## 2024-05-15 LAB — APTT: aPTT: 89 s — ABNORMAL HIGH (ref 24–36)

## 2024-05-15 LAB — HIV ANTIBODY (ROUTINE TESTING W REFLEX): HIV Screen 4th Generation wRfx: NONREACTIVE

## 2024-05-15 MED ORDER — BUDESONIDE 0.5 MG/2ML IN SUSP
0.5000 mg | Freq: Two times a day (BID) | RESPIRATORY_TRACT | Status: DC
  Administered 2024-05-15 – 2024-05-20 (×11): 0.5 mg via RESPIRATORY_TRACT
  Filled 2024-05-15 (×11): qty 2

## 2024-05-15 MED ORDER — BOOST / RESOURCE BREEZE PO LIQD CUSTOM
1.0000 | Freq: Three times a day (TID) | ORAL | Status: DC
  Administered 2024-05-15 – 2024-05-18 (×6): 1 via ORAL

## 2024-05-15 MED ORDER — IPRATROPIUM-ALBUTEROL 0.5-2.5 (3) MG/3ML IN SOLN
3.0000 mL | Freq: Three times a day (TID) | RESPIRATORY_TRACT | Status: DC
  Administered 2024-05-16 – 2024-05-19 (×10): 3 mL via RESPIRATORY_TRACT
  Filled 2024-05-15 (×10): qty 3

## 2024-05-15 MED ORDER — MORPHINE SULFATE (PF) 2 MG/ML IV SOLN
2.0000 mg | INTRAVENOUS | Status: DC | PRN
  Administered 2024-05-15 – 2024-05-18 (×3): 2 mg via INTRAVENOUS
  Filled 2024-05-15 (×3): qty 1

## 2024-05-15 MED ORDER — METHYLPREDNISOLONE SODIUM SUCC 125 MG IJ SOLR
125.0000 mg | Freq: Once | INTRAMUSCULAR | Status: AC
  Administered 2024-05-15: 125 mg via INTRAVENOUS
  Filled 2024-05-15: qty 2

## 2024-05-15 MED ORDER — INSULIN ASPART 100 UNIT/ML IJ SOLN
0.0000 [IU] | Freq: Three times a day (TID) | INTRAMUSCULAR | Status: DC
  Administered 2024-05-15: 7 [IU] via SUBCUTANEOUS
  Administered 2024-05-15 – 2024-05-16 (×3): 5 [IU] via SUBCUTANEOUS
  Administered 2024-05-16: 3 [IU] via SUBCUTANEOUS
  Administered 2024-05-17: 1 [IU] via SUBCUTANEOUS
  Administered 2024-05-17: 9 [IU] via SUBCUTANEOUS
  Administered 2024-05-17: 3 [IU] via SUBCUTANEOUS
  Administered 2024-05-18: 1 [IU] via SUBCUTANEOUS
  Administered 2024-05-18: 2 [IU] via SUBCUTANEOUS
  Administered 2024-05-18: 9 [IU] via SUBCUTANEOUS
  Administered 2024-05-19: 1 [IU] via SUBCUTANEOUS
  Administered 2024-05-19: 3 [IU] via SUBCUTANEOUS
  Administered 2024-05-20: 1 [IU] via SUBCUTANEOUS
  Administered 2024-05-20: 3 [IU] via SUBCUTANEOUS
  Filled 2024-05-15 (×15): qty 1

## 2024-05-15 MED ORDER — ADULT MULTIVITAMIN W/MINERALS CH
1.0000 | ORAL_TABLET | Freq: Every day | ORAL | Status: DC
  Administered 2024-05-15 – 2024-05-20 (×6): 1 via ORAL
  Filled 2024-05-15 (×6): qty 1

## 2024-05-15 MED ORDER — POTASSIUM CHLORIDE CRYS ER 20 MEQ PO TBCR
20.0000 meq | EXTENDED_RELEASE_TABLET | Freq: Once | ORAL | Status: AC
  Administered 2024-05-15: 20 meq via ORAL
  Filled 2024-05-15: qty 1

## 2024-05-15 MED ORDER — PREDNISONE 20 MG PO TABS
20.0000 mg | ORAL_TABLET | Freq: Every day | ORAL | Status: AC
  Administered 2024-05-16 – 2024-05-20 (×5): 20 mg via ORAL
  Filled 2024-05-15 (×5): qty 1

## 2024-05-15 NOTE — Progress Notes (Signed)
 PROGRESS NOTE Alison Richardson    DOB: 12-21-1933, 89 y.o.  WUJ:[id number]    Code Status: Limited: Do not attempt resuscitation (DNR) -DNR-LIMITED -Do Not Intubate/DNI    DOA: 05/14/2024   LOS: 1   Brief hospital course  Alison Richardson is a 89 y.o. female with medical history significant for dementia, HTN, DM, CHF, atrial fibrillation on Eliquis  and amiodarone , PAD, recently hospitalized from 5/16 to 5/17 with hypotension that improved with medication adjustment, who presented to the ED brought in by EMS from her facility with shortness of breath for which she received DuoNebs and Solu-Medrol  en route.    ED course: Tachypneic to the low 30s with elevated blood pressure of 153/105, hypoxic to 87% on 4 L when she arrived, subsequently transitioned to HFNC and then BiPAP Labs notable for: VBG on BiPAP FiO2 60% with pH 7.36 and PCO244. WBC of 24,000 with lactic acid 2.8 and negative respiratory viral panel. Troponin 14--108 with BNP 290 Lipase and LFTs unremarkable   EKG: sinus at 70 with no acute ST-T wave changes Chest x-ray mild to moderate cardiogenic failure   Patient treated with IV Lasix  40 mg, Nitropaste 0.5 inch and was also administered fentanyl  50 mcg x 2 for work of breathing. She was empirically started on Rocephin  and azithromycin  Following troponin elevation she was administered chewable aspirin  324 and started on a heparin  infusion  Cardiology was consulted.  Her condition continues to remain guarded as she is requiring high level of O2 support to maintain minimal O2 sat goals and cannot tolerate any exertion. Remarkably, she states that she feels well and denies dyspnea currently. Consulted pulmonology for further evaluation as well as added steroids and breathing treatments.   Assessment & Plan  Principal Problem:   Acute on chronic diastolic CHF (congestive heart failure) (HCC) Active Problems:   NSTEMI (non-ST elevated myocardial infarction) (HCC)   CAD (coronary artery  disease)   Acute respiratory failure with hypoxia (HCC)   Hypothyroidism   Leukocytosis   Paroxysmal atrial fibrillation (HCC)   Depression with anxiety   Cognitive impairment, mild, so stated   PAD (peripheral artery disease) (HCC)   DM type 2 (diabetes mellitus, type 2) (HCC)   Lactic acidosis  Acute on chronic diastolic CHF-Signs of pulmonary edema on imaging. Had echo January 2025 with EF 60 to 65%. Repeat echo this admission showed EF 40-45%, global hypokinesis, moderately enlarged RV, moderately elevated pulmonary artery pressure. Mild-mod TV regurg. - Continue IV Lasix  - weaned off nitroglycerin  gtt Continue metoprolol  with hold parameters. Hypotension precludes from further GDMT at this time.  - cardiology following Daily weights with intake and output monitoring   NSTEMI CAD- Troponin 14--108, without acute EKG changes or complaints of chest pain On heparin  infusion so will hold Eliquis  Continue pravastatin .  Continue metoprolol  as BP would tolerate - tentatively planning heart cath once respiratory status has stabilized so timing is not determined at this time.     Acute respiratory failure with hypoxia- no baseline O2 requirement Differential includes NSTEMI, acute bronchitis, possible acute PE. D-dimer 1.2. has been adherent with eliquis . Intermittently on bipap. Echo shows significant increase in pulmonary pressures but may have been completed while on bipap so would interfere with results. Pulmonology consulted to review.  -Continue BiPAP and wean as tolerated Per report: patient does not want to be intubated and would want to transition to comfort care measures with as needed morphine /fentanyl  for air hunger if needed--she is agreeable to fentanyl /morphine  as needed - breathing  treatments - steroids  - diuresis as above - palliative consulted - consulting pulmonology, Dr. Meredeth Stallion   Paroxysmal atrial fibrillation (HCC) On heparin  infusion so we will hold  apixaban  Continue amiodarone  and metoprolol     Hypothyroidism Continue levothyroxine    Depression with anxiety Continue venlafaxine    DM type 2 (diabetes mellitus, type 2) (HCC) Sliding scale insulin  coverage   PAD (peripheral artery disease) (HCC) Continue pravastatin  Currently on aspirin    Cognitive impairment, mild, so stated Delirium precautions  Body mass index is 26.13 kg/m.  VTE ppx: heparin   Diet:     Diet   Diet clear liquid Room service appropriate? Yes; Fluid consistency: Thin   Consultants: Cardiology  Pulmonology   Subjective 05/15/24    Pt appears to be very comfortable laying in bed. O2 drops below 90% with conversing.    Objective  Blood pressure (!) 148/89, pulse 65, temperature 97.9 F (36.6 C), temperature source Oral, resp. rate (!) 22, SpO2 96%.  Intake/Output Summary (Last 24 hours) at 05/15/2024 0807 Last data filed at 05/15/2024 0805 Gross per 24 hour  Intake 550.81 ml  Output 900 ml  Net -349.19 ml   Filed Weights   05/14/24 1330 05/15/24 0500  Weight: 74.8 kg 73.4 kg   Physical Exam Vitals and nursing note reviewed.  Constitutional:      General: She is not in acute distress.    Appearance: She is not ill-appearing.  HENT:     Head: Normocephalic.     Mouth/Throat:     Mouth: Mucous membranes are moist.  Cardiovascular:     Rate and Rhythm: Normal rate and regular rhythm.  Pulmonary:     Effort: Pulmonary effort is normal.     Breath sounds: Decreased breath sounds present.  Musculoskeletal:     Right lower leg: No edema.     Left lower leg: No edema.  Skin:    General: Skin is warm and dry.  Neurological:     General: No focal deficit present.     Mental Status: She is alert.  Psychiatric:        Mood and Affect: Mood normal.    Labs   I have personally reviewed the following labs and imaging studies CBC    Component Value Date/Time   WBC 21.4 (H) 05/15/2024 0553   RBC 3.85 (L) 05/15/2024 0553   HGB 10.0 (L)  05/15/2024 0553   HGB 11.2 04/13/2024 1357   HCT 33.1 (L) 05/15/2024 0553   HCT 35.7 04/13/2024 1357   PLT 313 05/15/2024 0553   PLT 419 04/13/2024 1357   MCV 86.0 05/15/2024 0553   MCV 85 04/13/2024 1357   MCH 26.0 05/15/2024 0553   MCHC 30.2 05/15/2024 0553   RDW 15.7 (H) 05/15/2024 0553   RDW 14.6 04/13/2024 1357   LYMPHSABS 4.5 (H) 05/14/2024 0034   LYMPHSABS 2.5 04/13/2024 1357   MONOABS 1.4 (H) 05/14/2024 0034   EOSABS 0.3 05/14/2024 0034   EOSABS 0.2 04/13/2024 1357   BASOSABS 0.1 05/14/2024 0034   BASOSABS 0.1 04/13/2024 1357      Latest Ref Rng & Units 05/15/2024    5:53 AM 05/14/2024   12:34 AM 05/12/2024   11:05 AM  BMP  Glucose 70 - 99 mg/dL 096  045  409   BUN 8 - 23 mg/dL 28  13  11    Creatinine 0.44 - 1.00 mg/dL 8.11  9.14  7.82   BUN/Creat Ratio 12 - 28   12   Sodium 135 -  145 mmol/L 136  139  144   Potassium 3.5 - 5.1 mmol/L 3.7  3.5  4.0   Chloride 98 - 111 mmol/L 104  107  106   CO2 22 - 32 mmol/L 22  19  19    Calcium  8.9 - 10.3 mg/dL 7.8  8.9  9.3     ECHOCARDIOGRAM COMPLETE Result Date: 05/14/2024    ECHOCARDIOGRAM REPORT   Patient Name:   Alison Richardson Date of Exam: 05/14/2024 Medical Rec #:  [id number]      Height:       66.0 in Accession #:    [id number]     Weight:       164.9 lb Date of Birth:  [birth year]       BSA:          1.842 m Patient Age:    89 years       BP:           123/73 mmHg Patient Gender: F              HR:           92 bpm. Exam Location:  [facility] Procedure: 2D Echo, Cardiac Doppler and Color Doppler (Both Spectral and Color            Flow Doppler were utilized during procedure). Indications:     NSTEMI I21.4  History:         Patient has prior history of Echocardiogram examinations, most                  recent 01/01/2024. CHF, Arrythmias:Atrial Fibrillation; Risk                  Factors:Hypertension.  Sonographer:     Broadus Canes Referring Phys:  [id number] Lanetta Pion Diagnosing Phys: Constancia Delton MD  Sonographer Comments: Suboptimal  apical window. IMPRESSIONS  1. Left ventricular ejection fraction, by estimation, is 40 to 45%. The left ventricle has mild to moderately decreased function. The left ventricle demonstrates global hypokinesis. Left ventricular diastolic parameters are indeterminate.  2. Right ventricular systolic function is normal. The right ventricular size is moderately enlarged. There is moderately elevated pulmonary artery systolic pressure. The estimated right ventricular systolic pressure is 54.3 mmHg.  3. The mitral valve is normal in structure. Mild mitral valve regurgitation.  4. Tricuspid valve regurgitation is mild to moderate.  5. The aortic valve is tricuspid. Aortic valve regurgitation is not visualized.  6. The inferior vena cava is normal in size with greater than 50% respiratory variability, suggesting right atrial pressure of 3 mmHg. FINDINGS  Left Ventricle: Left ventricular ejection fraction, by estimation, is 40 to 45%. The left ventricle has mild to moderately decreased function. The left ventricle demonstrates global hypokinesis. The left ventricular internal cavity size was normal in size. There is no left ventricular hypertrophy. Left ventricular diastolic parameters are indeterminate. Right Ventricle: The right ventricular size is moderately enlarged. No increase in right ventricular wall thickness. Right ventricular systolic function is normal. There is moderately elevated pulmonary artery systolic pressure. The tricuspid regurgitant  velocity is 3.58 m/s, and with an assumed right atrial pressure of 3 mmHg, the estimated right ventricular systolic pressure is 54.3 mmHg. Left Atrium: Left atrial size was normal in size. Right Atrium: Right atrial size was normal in size. Pericardium: There is no evidence of pericardial effusion. Mitral Valve: The mitral valve is normal in structure. Mild mitral valve regurgitation. Tricuspid Valve: The tricuspid valve is  normal in structure. Tricuspid valve regurgitation  is mild to moderate. Aortic Valve: The aortic valve is tricuspid. Aortic valve regurgitation is not visualized. Aortic valve mean gradient measures 3.0 mmHg. Aortic valve peak gradient measures 4.4 mmHg. Aortic valve area, by VTI measures 1.79 cm. Pulmonic Valve: The pulmonic valve was not well visualized. Pulmonic valve regurgitation is not visualized. Aorta: The aortic root is normal in size and structure. Venous: The inferior vena cava is normal in size with greater than 50% respiratory variability, suggesting right atrial pressure of 3 mmHg. IAS/Shunts: No atrial level shunt detected by color flow Doppler.  LEFT VENTRICLE PLAX 2D LVIDd:         4.00 cm     Diastology LVIDs:         3.40 cm     LV e' medial:    9.57 cm/s LV PW:         0.90 cm     LV E/e' medial:  9.5 LV IVS:        1.10 cm     LV e' lateral:   11.30 cm/s LVOT diam:     2.00 cm     LV E/e' lateral: 8.1 LV SV:         42 LV SV Index:   23 LVOT Area:     3.14 cm  LV Volumes (MOD) LV vol d, MOD A4C: 55.7 ml LV vol s, MOD A4C: 38.3 ml LV SV MOD A4C:     55.7 ml RIGHT VENTRICLE RV Basal diam:  5.20 cm RV Mid diam:    4.00 cm RV S prime:     12.30 cm/s TAPSE (M-mode): 1.7 cm LEFT ATRIUM         Index       RIGHT ATRIUM           Index LA diam:    4.00 cm 2.17 cm/m  RA Area:     16.70 cm                                 RA Volume:   44.30 ml  24.05 ml/m  AORTIC VALVE AV Area (Vmax):    2.03 cm AV Area (Vmean):   1.74 cm AV Area (VTI):     1.79 cm AV Vmax:           105.00 cm/s AV Vmean:          78.550 cm/s AV VTI:            0.234 m AV Peak Grad:      4.4 mmHg AV Mean Grad:      3.0 mmHg LVOT Vmax:         67.90 cm/s LVOT Vmean:        43.500 cm/s LVOT VTI:          0.133 m LVOT/AV VTI ratio: 0.57  AORTA Ao Root diam: 3.20 cm MITRAL VALVE               TRICUSPID VALVE MV Area (PHT): 3.23 cm    TR Peak grad:   51.3 mmHg MV Decel Time: 235 msec    TR Vmax:        358.00 cm/s MV E velocity: 91.20 cm/s                            SHUNTS  Systemic VTI:  0.13 m                            Systemic Diam: 2.00 cm Constancia Delton MD Electronically signed by Constancia Delton MD Signature Date/Time: 05/14/2024/4:18:36 PM    Final    DG Chest Portable 1 View Result Date: 05/14/2024 CLINICAL DATA:  Dyspnea, hypoxia, CHF EXAM: PORTABLE CHEST 1 VIEW COMPARISON:  05/08/2024 FINDINGS: The lungs are symmetrically well expanded. Interval development of diffuse interstitial pulmonary edema, in keeping with changes of mild to moderate cardiogenic failure. No pneumothorax or pleural effusion. Cardiac size is mildly enlarged, stable. No acute bone abnormality. IMPRESSION: 1. Mild to moderate cardiogenic failure. Electronically Signed   By: Worthy Heads M.D.   On: 05/14/2024 00:48    Disposition Plan & Communication  Patient status: Inpatient  Admitted From: Home Planned disposition location: TBD Anticipated discharge date: TBD pending clinical course  Family Communication: none at bedside    Author: Ree Candy, DO [facility] 05/15/2024, 8:07 AM   Available by Epic secure chat 7AM-7PM. If 7PM-7AM, please contact night-coverage.  [facility] contact information found on [email or web address].

## 2024-05-15 NOTE — Plan of Care (Signed)
  Problem: Education: Goal: Knowledge of General Education information will improve Description: Including pain rating scale, medication(s)/side effects and non-pharmacologic comfort measures Outcome: Progressing   Problem: Clinical Measurements: Goal: Will remain free from infection Outcome: Progressing   Problem: Clinical Measurements: Goal: Respiratory complications will improve Outcome: Progressing   Problem: Clinical Measurements: Goal: Cardiovascular complication will be avoided Outcome: Progressing   Problem: Activity: Goal: Risk for activity intolerance will decrease Outcome: Progressing   Problem: Pain Managment: Goal: General experience of comfort will improve and/or be controlled Outcome: Progressing   Problem: Safety: Goal: Ability to remain free from injury will improve Outcome: Progressing

## 2024-05-15 NOTE — Progress Notes (Signed)
 Progress Note  Patient Name: Alison Richardson Date of Encounter: 05/15/2024  Primary Cardiologist: Jerelene Monday  Subjective   Feels like her dyspnea is improving.  Off BiPAP.  Remains on HFNC at 10 L.  No chest pain.  Echo showed an EF of 40 to 45%, global hypokinesis, normal RV systolic function with moderately enlarged ventricular cavity size and moderately elevated RVSP at 54 mmHg (performed on BiPAP), mild mitral regurgitation, and mild to moderate tricuspid regurgitation.  BUN/SCr trending from 13/0.75-28/0.9.  Inpatient Medications    Scheduled Meds:  amiodarone   200 mg Oral Daily   aspirin  EC  81 mg Oral Daily   budesonide  (PULMICORT ) nebulizer solution  0.5 mg Nebulization BID   donepezil   10 mg Oral QHS   feeding supplement  1 Container Oral TID BM   furosemide   40 mg Intravenous BID   insulin  aspart  0-9 Units Subcutaneous TID WC   ipratropium-albuterol   3 mL Nebulization QID   levothyroxine   88 mcg Oral Q0600   methylPREDNISolone  (SOLU-MEDROL ) injection  125 mg Intravenous Once   multivitamin with minerals  1 tablet Oral Daily   pravastatin   20 mg Oral QHS   [START ON 05/16/2024] predniSONE   20 mg Oral Q breakfast   venlafaxine  XR  75 mg Oral Daily   Continuous Infusions:  heparin  1,050 Units/hr (05/15/24 0805)   promethazine (PHENERGAN) injection (IM or IVPB)     PRN Meds: acetaminophen , albuterol , guaiFENesin -dextromethorphan , morphine  injection, ondansetron  (ZOFRAN ) IV, mouth rinse, promethazine (PHENERGAN) injection (IM or IVPB)   Vital Signs    Vitals:   05/15/24 0453 05/15/24 0500 05/15/24 0700 05/15/24 0800  BP: 110/63  (!) 100/54 (!) 102/57  Pulse: 62  61 61  Resp: 18  17 14   Temp:    98.3 F (36.8 C)  TempSrc:    Oral  SpO2: 97%  99% 97%  Weight:  73.4 kg    Height:        Intake/Output Summary (Last 24 hours) at 05/15/2024 0929 Last data filed at 05/15/2024 0805 Gross per 24 hour  Intake 550.81 ml  Output 900 ml  Net -349.19 ml   Filed Weights    05/14/24 1330 05/15/24 0500  Weight: 74.8 kg 73.4 kg    Telemetry    Sinus rhythm with sinus bradycardia - Personally Reviewed  ECG    No new tracings - Personally Reviewed  Physical Exam   GEN: No acute distress.   Neck: No JVD. Cardiac: RRR, no murmurs, rubs, or gallops.  Respiratory: Improving rhonchorous breath sounds bilaterally.  High flow nasal cannula at 10 L. GI: Soft, nontender, non-distended.   MS: No edema; No deformity. Neuro:  Alert and oriented x 3; Nonfocal.  Psych: Normal affect.  Labs    Chemistry Recent Labs  Lab 05/08/24 1210 05/09/24 0456 05/12/24 1105 05/14/24 0034 05/15/24 0553  NA 140 144 144 139 136  K 3.3* 3.4* 4.0 3.5 3.7  CL 106 112* 106 107 104  CO2 23 24 19* 19* 22  GLUCOSE 111* 104* 169* 230* 185*  BUN 21 12 11 13  28*  CREATININE 0.91 0.63 0.91 0.75 0.90  CALCIUM  8.0* 8.6* 9.3 8.9 7.8*  PROT 5.8*  --   --  7.2  --   ALBUMIN 3.4*  --   --  4.0  --   AST 24  --   --  49*  --   ALT 20  --   --  39  --   ALKPHOS 63  --   --  89  --   BILITOT 0.6  --   --  1.0  --   GFRNONAA >60 >60  --  >60 >60  ANIONGAP 11 8  --  13 10     Hematology Recent Labs  Lab 05/09/24 0456 05/14/24 0034 05/15/24 0553  WBC 9.2 24.4* 21.4*  RBC 4.25 4.74 3.85*  HGB 11.3* 12.3 10.0*  HCT 35.3* 40.4 33.1*  MCV 83.1 85.2 86.0  MCH 26.6 25.9* 26.0  MCHC 32.0 30.4 30.2  RDW 15.2 15.4 15.7*  PLT 324 405* 313    Cardiac EnzymesNo results for input(s): "TROPONINI" in the last 168 hours. No results for input(s): "TROPIPOC" in the last 168 hours.   BNP Recent Labs  Lab 05/14/24 0034  BNP 290.1*     DDimer  Recent Labs  Lab 05/14/24 0423  DDIMER 1.20*     Radiology    DG Chest Portable 1 View Result Date: 05/14/2024 IMPRESSION: 1. Mild to moderate cardiogenic failure. Electronically Signed   By: Worthy Heads M.D.   On: 05/14/2024 00:48    Cardiac Studies   2D echo 05/14/2024: 1. Left ventricular ejection fraction, by estimation, is 40  to 45%. The  left ventricle has mild to moderately decreased function. The left  ventricle demonstrates global hypokinesis. Left ventricular diastolic  parameters are indeterminate.   2. Right ventricular systolic function is normal. The right ventricular  size is moderately enlarged. There is moderately elevated pulmonary artery  systolic pressure. The estimated right ventricular systolic pressure is  54.3 mmHg.   3. The mitral valve is normal in structure. Mild mitral valve  regurgitation.   4. Tricuspid valve regurgitation is mild to moderate.   5. The aortic valve is tricuspid. Aortic valve regurgitation is not  visualized.   6. The inferior vena cava is normal in size with greater than 50%  respiratory variability, suggesting right atrial pressure of 3 mmHg.  __________  2D echo 12/31/2023: 1. Left ventricular ejection fraction, by estimation, is 60 to 65%. The  left ventricle has normal function. The left ventricle has no regional  wall motion abnormalities. Left ventricular diastolic parameters are  indeterminate.   2. Right ventricular systolic function is normal. The right ventricular  size is normal. There is mildly elevated pulmonary artery systolic  pressure. The estimated right ventricular systolic pressure is 40.8 mmHg.   3. Left atrial size was mild to moderately dilated.   4. The mitral valve is normal in structure. Mild to moderate mitral valve  regurgitation. No evidence of mitral stenosis.   5. Tricuspid valve regurgitation is mild to moderate.   6. The aortic valve has an indeterminant number of cusps. Aortic valve  regurgitation is not visualized. No aortic stenosis is present.   7. The inferior vena cava is normal in size with greater than 50%  respiratory variability, suggesting right atrial pressure of 3 mmHg.  __________   2D echo 04/21/2023: 1. Left ventricular ejection fraction, by estimation, is 55 to 60%. Left  ventricular ejection fraction by PLAX is 67  %. The left ventricle has  normal function. The left ventricle has no regional wall motion  abnormalities. Left ventricular diastolic  parameters are indeterminate.   2. Right ventricular systolic function is normal. The right ventricular  size is normal. There is mildly elevated pulmonary artery systolic  pressure. The estimated right ventricular systolic pressure is 38.3 mmHg.   3. The mitral valve is normal in structure. Moderate mitral valve  regurgitation. No  evidence of mitral stenosis.   4. Tricuspid valve regurgitation is moderate to severe.   5. The aortic valve is tricuspid. Aortic valve regurgitation is not  visualized. No aortic stenosis is present.   6. The inferior vena cava is normal in size with greater than 50%  respiratory variability, suggesting right atrial pressure of 3 mmHg.  __________   2D echo 12/11/2022: 1. Left ventricular ejection fraction, by estimation, is 50 to 55%. The  left ventricle has low normal function. The left ventricle has no regional  wall motion abnormalities. Left ventricular diastolic parameters were  normal.   2. Right ventricular systolic function is normal. The right ventricular  size is normal.   3. The mitral valve is normal in structure. Mild mitral valve  regurgitation. No evidence of mitral stenosis.   4. The aortic valve is normal in structure. Aortic valve regurgitation is  not visualized. No aortic stenosis is present.   5. The inferior vena cava is normal in size with greater than 50%  respiratory variability, suggesting right atrial pressure of 3 mmHg.  __________   2D echo 10/31/2021: 1. Left ventricular ejection fraction, by estimation, is 45 to 50%. The  left ventricle has mildly decreased function. The left ventricle has no  regional wall motion abnormalities. Left ventricular diastolic parameters  were normal.   2. Right ventricular systolic function is normal. The right ventricular  size is normal.   3. The mitral  valve is normal in structure. Mild mitral valve  regurgitation. No evidence of mitral stenosis.   4. The aortic valve is normal in structure. Aortic valve regurgitation is  not visualized. No aortic stenosis is present.   5. The inferior vena cava is normal in size with greater than 50%  respiratory variability, suggesting right atrial pressure of 3 mmHg.  __________   LHC 10/12/2021:   Ost RCA to Prox RCA lesion is 99% stenosed.   Dist RCA lesion is 65% stenosed.   RPDA lesion is 60% stenosed.   1st Diag lesion is 99% stenosed.   Ost Cx to Prox Cx lesion is 25% stenosed.   Ost LAD to Prox LAD lesion is 25% stenosed.   There is mild left ventricular systolic dysfunction.   LV end diastolic pressure is mildly elevated.   The left ventricular ejection fraction is 35-45% by visual estimate.   89 year old female with known chronic nonvalvular atrial fibrillation hypertension hyperlipidemia with acute non-ST elevation myocardial infarction   Cardiac catheterization showing mild inferior hypokinesis and apical hypokinesis with ejection fraction of 40 to 45%   Severe and/or critical stenosis of ostial right coronary artery and moderate atherosclerosis of distal right coronary artery and PDA complex in nature with good collaterals from left anterior descending artery Mild atherosclerosis of left anterior descending artery and circumflex artery   After discussion with cardiovascular team would best be medically manage at this time due to complex lesions and not ideal for PCI and stent placement   Plan Isosorbide  for better collateral flow 2.  Increase beta-blocker for better heart rate control of atrial fibrillation with a goal heart rate between 60 and 90 bpm 3.  Reinstate anticoagulation for further risk reduction and stroke with atrial fibrillation 4.  Single antiplatelet therapy with Plavix  5.  Hypertension control and high intensity cholesterol therapy 6.  Cardiac  rehabilitation __________   2D echo 10/07/2020: 1. Left ventricular ejection fraction, by estimation, is 60 to 65%. The  left ventricle has normal function. The left ventricle  has no regional  wall motion abnormalities. Left ventricular diastolic parameters are  consistent with Grade I diastolic  dysfunction (impaired relaxation).   2. Right ventricular systolic function is normal. The right ventricular  size is normal.   3. The mitral valve is grossly normal. Mild mitral valve regurgitation.   4. The aortic valve is normal in structure. Aortic valve regurgitation is  trivial.  __________   2D echo 09/29/2019: 1. Left ventricular ejection fraction, by visual estimation, is 60 to  65%. The left ventricle has normal function. Left ventricular septal wall  thickness was mildly increased. Mildly increased left ventricular  posterior wall thickness. There is mildly  increased left ventricular hypertrophy.   2. Global right ventricle has normal systolic function.The right  ventricular size is mildly enlarged. No increase in right ventricular wall  thickness.   3. Left atrial size was mildly dilated.   4. Right atrial size was normal.   5. The mitral valve is grossly normal. Mild mitral valve regurgitation.   6. The tricuspid valve is grossly normal. Tricuspid valve regurgitation  is mild.   7. The aortic valve is tricuspid Aortic valve regurgitation was not  visualized by color flow Doppler.   8. The pulmonic valve was not well visualized. Pulmonic valve  regurgitation is trivial by color flow Doppler.   9. Moderately elevated pulmonary artery systolic pressure.  10. No shunts by color flow doppler.   Patient Profile     89 y.o. female with history of CAD medically managed, persistent A-fib, HFimpEF, valvular heart disease, DM2, HTN, HLD, and hypothyroidism who is being seen today for the evaluation of HFrEF and elevated troponin at the request of Dr. Vallarie Gauze.   Assessment & Plan     1. Acute hypoxic respiratory failure: - Sudden onset of dyspnea on the evening of 5/21 - Unclear etiology at this time - Echo with new cardiomyopathy with an EF of 40 to 45% (LV systolic function normal by echo in 12/2023) as well as elevated RVSP estimated at 54 (on BiPAP at that time), and dilated RV - D-dimer elevated at 1.2, consider evaluation for PE given acute hypoxic respiratory failure, dilated RV, and elevated RVSP (however, PE is presumably unlikely as long as she has been adherent to apixaban .  CT of the chest may also help guide ongoing pharmacotherapy, and to evaluate for possible amiodarone  toxicity), will defer to primary service - Wean supplemental oxygen as able -Primary service has consulted pulmonology   2.  CAD involving the native coronary arteries with elevated high-sensitivity troponin: - Never with chest pain - Troponin trended to 200 - On heparin  drip for now until it is clear she will not require inpatient invasive ischemic testing, last dose of apixaban  on the evening of 5/21, no emergent indication for cardiac cath at this time - PTA aspirin  and statin - Will benefit from [facility], though need to improve respiratory status prior   3.  Acute on chronic HFrEF with pulmonary hypertension: - EF previously 35 to 45% by LV gram in 09/2021 and 45 to 50% by echo in 10/2021, subsequently normalized -LV systolic function now reduced to 40 to 45% - Continue IV furosemide  40 mg twice daily for now given ongoing hypoxia and elevated right-sided pressures with close monitoring of urine output and renal function - Not currently on beta-blocker (previously bradycardic in the 40s bpm at office visit in 11/2023 leading to decrease of beta-blocker dose to 50 mg at that time) - Escalate GDMT as  able, relative hypotension precludes at this time   3.  Persistent A-fib: - Maintaining sinus rhythm on amiodarone  200 mg daily -TSH normal  - AST mildly elevated this admission with normal  ALT, continue with periodic monitoring - CHA2DS2-VASc at least 7 (CHF, HTN, age x 2, DM, vascular disease, sex category) - Last dose of apixaban  on the evening of 5/21 - IV heparin  while admitted until it is clear she will not require invasive testing   4.  Valvular heart disease: - Stable     For questions or updates, please contact [facility] Please consult [email or web address] for contact info under Cardiology/STEMI.    Signed, Varney Gentleman, PA-C [facility] Pager: [phone] 05/15/2024, 9:29 AM

## 2024-05-15 NOTE — Progress Notes (Signed)
 Initial Nutrition Assessment  DOCUMENTATION CODES:   Not applicable  INTERVENTION:   -Boost Breeze po TID, each supplement provides 250 kcal and 9 grams of protein  -MVI with minerals daily -RD will follow for diet advancement and adjust supplement regimen as appropriate  NUTRITION DIAGNOSIS:   Increased nutrient needs related to chronic illness (CHF) as evidenced by estimated needs.  GOAL:   Patient will meet greater than or equal to 90% of their needs  MONITOR:   PO intake, Supplement acceptance, Diet advancement  REASON FOR ASSESSMENT:   Consult Assessment of nutrition requirement/status  ASSESSMENT:   Pt with medical history significant for dementia, HTN, DM, CHF, atrial fibrillation on Eliquis  and amiodarone , PAD, recently hospitalized from 5/16 to 5/17 with hypotension that improved with medication adjustment, who presented from her facility with shortness of breath for which she received DuoNebs and Solu-Medrol  en route.  Pt admitted with CHF.   Reviewed I/O's: -362 ml x 24 hours  UOP: 900 ml x 24 hours  Pt is a resident of [address] of [place]. Noted recent hospital discharge on 05/09/24.  Pt just advanced to clear liquid diet after RD visit.   Pt sleeping soundly at time of visit, but arose easily when examined. Pt unable to provide history due to cognitive impairment. No family present at time of visit. Pt offers no complaints, stating " I just sleep all the time until someone wakes me up".   Per MD notes, pt and daughter report plan to transition to comfort care if pt continues to decline and does not respond to interventions. Palliative care has been consulted for goals of care discussions.   Reviewed wt hx; pt has experienced a 5.3% wt loss over the past 5 months, which is not significant for time frame.   Medications reviewed and include pulmicort  and lasix .   Lab Results  Component Value Date   HGBA1C 8.2 (H) 04/09/2024   PTA DM medications are 0.5  mg semaglutide  weekly.   Labs reviewed: CBGS: 175 (inpatient orders for glycemic control are 0-9 units insulin  aspart TID with meals).    NUTRITION - FOCUSED PHYSICAL EXAM:  Flowsheet Row Most Recent Value  Orbital Region No depletion  Upper Arm Region Mild depletion  Thoracic and Lumbar Region No depletion  Buccal Region No depletion  Temple Region No depletion  Clavicle Bone Region No depletion  Clavicle and Acromion Bone Region No depletion  Scapular Bone Region No depletion  Dorsal Hand No depletion  Patellar Region Mild depletion  Anterior Thigh Region Mild depletion  Posterior Calf Region Mild depletion  Edema (RD Assessment) Mild  Hair Reviewed  Eyes Reviewed  Mouth Reviewed  Skin Reviewed  Nails Reviewed       Diet Order:   Diet Order             Diet clear liquid Room service appropriate? Yes; Fluid consistency: Thin  Diet effective now                   EDUCATION NEEDS:   Not appropriate for education at this time  Skin:  Skin Assessment: Reviewed RN Assessment  Last BM:  Unknown  Height:   Ht Readings from Last 1 Encounters:  05/14/24 5' 5.98" (1.676 m)    Weight:   Wt Readings from Last 1 Encounters:  05/15/24 73.4 kg    Ideal Body Weight:  59.1 kg  BMI:  Body mass index is 26.13 kg/m.  Estimated Nutritional Needs:   Kcal:  1500-1700  Protein:  75-90 grams  Fluid:  1.5-1.7 L    Herschel Lords, RD, LDN, CDCES Registered Dietitian III Certified Diabetes Care and Education Specialist If unable to reach this RD, please use "RD Inpatient" group chat on secure chat between hours of 8am-4 pm daily

## 2024-05-15 NOTE — Progress Notes (Addendum)
 Pt on nitroglycerin  drip at 5 mcg/hr. Noticed QTC of 696 to 708. MD Vallarie Gauze made aware and ordered to do 12 lead EKG.   Update 0036: EKG resulted. MD Vallarie Gauze made aware but no new order place. Will notify incoming shift.

## 2024-05-15 NOTE — TOC Benefit Eligibility Note (Signed)
 Pharmacy Patient Advocate Encounter  Insurance verification completed.    The patient is insured through [facility]. Patient has Medicare and is not eligible for a copay card, but may be able to apply for patient assistance or Medicare RX Payment Plan (Patient Must reach out to their plan, if eligible for payment plan), if available.    Ran test claim for Jardiance  10mg  and the current 30 day co-pay is $40.  Ran test claim for Farxiga  10mg  and the current 30 day co-pay is $64.  Ran test claim for Dapagliflozin  and the current 30 day co-pay is not covered.   This test claim was processed through [facility]- copay amounts may vary at other pharmacies due to pharmacy/plan contracts, or as the patient moves through the different stages of their insurance plan.

## 2024-05-15 NOTE — Progress Notes (Signed)
       CROSS COVER NOTE  NAME: Berniece Abid MRN: [id number] DOB : May 12, 1933    Concern as stated by nurse / staff   Alison Richardson 89 yo Female, complaining of Left chest pain 3/10 on pain scale she describe it as dull aching, she said chest pain was new for her and was advise by MD to report it once she felt it. patient was admitted for SOB currently on HFNC 9lpm, last troponin was 282 and on heparin  drip at 10.54ml/hr. pulmo and cardio is on board. CT chest was done still pending result. I did EKG you can check it on the result.      Pertinent findings on chart review:    05/15/2024    9:11 PM 05/15/2024    6:00 PM 05/15/2024    5:00 PM  Vitals with BMI  Systolic 116 135 542  Diastolic 61 71 61  Pulse 75 82 73   Cardiac Panel (last 3 results) Recent Labs    05/14/24 0034 05/14/24 0207 05/14/24 1259  TROPONINIHS 14 108* 282*      Assessment and  Interventions   Assessment:  Chest pain with elevated troponin, seen by cardiology-note reviewed  Plan: Continue heparin  infusion, aspirin  and statin Continue morphine -current on orders X

## 2024-05-15 NOTE — Progress Notes (Signed)
 PHARMACY - ANTICOAGULATION CONSULT NOTE  Pharmacy Consult for Heparin   Indication: chest pain/ACS  Allergies  Allergen Reactions   Pantoprazole  Nausea And Vomiting   Metformin And Related Diarrhea    Patient Measurements: Height: 5' 5.98" (167.6 cm) Weight: 73.4 kg (161 lb 13.1 oz) IBW/kg (Calculated) : 59.26 HEPARIN  DW (KG): 74.3  Vital Signs: Temp: 98.1 F (36.7 C) (05/23 0352) Temp Source: Oral (05/23 0352) BP: 110/63 (05/23 0453) Pulse Rate: 62 (05/23 0453)  Labs: Recent Labs    05/12/24 1105 05/14/24 0034 05/14/24 0207 05/14/24 0424 05/14/24 0424 05/14/24 1259 05/14/24 2223 05/15/24 0553  HGB  --  12.3  --   --   --   --   --  10.0*  HCT  --  40.4  --   --   --   --   --  33.1*  PLT  --  405*  --   --   --   --   --  313  APTT  --   --   --  23*   < > 62* 74* 89*  HEPARINUNFRC  --   --   --  >1.10*  --   --   --  >1.10*  CREATININE 0.91 0.75  --   --   --   --   --  0.90  TROPONINIHS  --  14 108*  --   --  282*  --   --    < > = values in this interval not displayed.    Estimated Creatinine Clearance: 43.4 mL/min (by C-G formula based on SCr of 0.9 mg/dL).   Medical History: Past Medical History:  Diagnosis Date   Acute on chronic congestive heart failure (HCC) 10/07/2020   Arrhythmia    Atrial fibrillation and flutter (HCC) 01/06/2019   Plavix  added to eliquis  10-22 after nstemi       Last Assessment & Plan:    Marked facial hematoma post fall but ct of neck and head reportedly normal. No ha or ataxia now but is using a walker. On eliquis  and plavix      Atrial flutter (HCC) 01/2018   new onset    Basal cell carcinoma of back    Basal cell carcinoma of lip    Cerebral aneurysm    followed by [facility]   CHF (congestive heart failure) (HCC)    DDD (degenerative disc disease), lumbar    superior plate depression, L3 08/18/2014   Diabetes mellitus type II, controlled (HCC)    Diverticulosis    Dysrhythmia    Paroxysmal Supraventricular Tachycardia    Dysthymia    depression   GERD (gastroesophageal reflux disease)    History of falling 04/03/2022   History of meniscal tear    Humeral surgical neck fracture 05/16/2022   Hyperlipidemia    Hypertension    Hypokalemia 12/12/2022   Hypothyroidism    Late onset Alzheimer's disease with behavioral disturbance (HCC)    Leukocytosis 10/07/2020   Mild pulmonary hypertension (HCC)    Moderate asthma without complication 12/12/2022   Multifocal pneumonia 12/09/2022   Overactive bladder    Peripheral vascular disease (HCC)    Puncture wound of forehead 06/11/2023   Seasonal allergic rhinitis    Severe sepsis (HCC) 02/07/2022   Sleep apnea     Medications:  Medications Prior to Admission  Medication Sig Dispense Refill Last Dose/Taking   albuterol  (PROVENTIL ) (2.5 MG/3ML) 0.083% nebulizer solution Take 3 mLs (2.5 mg total) by nebulization every 6 (six)  hours as needed for wheezing or shortness of breath. 75 mL 3 Taking As Needed   amiodarone  (PACERONE ) 200 MG tablet Take 1 tablet (200 mg total) by mouth daily. 90 tablet 3 Taking   apixaban  (ELIQUIS ) 5 MG TABS tablet Take 1 tablet (5 mg total) by mouth 2 (two) times daily. 180 tablet 3 Taking   donepezil  (ARICEPT ) 10 MG tablet TAKE ONE TABLET (10 MG) BY MOUTH AT BEDTIME 90 tablet 0 Taking   gabapentin  (NEURONTIN ) 100 MG capsule Take 1 capsule (100 mg total) by mouth at bedtime.   Taking   levothyroxine  (SYNTHROID ) 88 MCG tablet TAKE 1 TABLET EVERY DAY ON EMPTY STOMACHWITH A GLASS OF WATER AT LEAST 30-60 MINBEFORE BREAKFAST 90 tablet 3 Taking   metoprolol  succinate (TOPROL -XL) 25 MG 24 hr tablet Take 0.5 tablets (12.5 mg total) by mouth daily. Take with or immediately following a meal.   Taking   nitroGLYCERIN  (NITROSTAT ) 0.4 MG SL tablet Place 1 tablet (0.4 mg total) under the tongue every 5 (five) minutes as needed for chest pain. 25 tablet 3 Taking As Needed   potassium chloride  SA (KLOR-CON  M) 20 MEQ tablet Take 1 tablet (20 mEq total) by  mouth daily as needed. Take it on the days you take Torsemide  30 tablet 0 Taking As Needed   pravastatin  (PRAVACHOL ) 20 MG tablet Take 1 tablet (20 mg total) by mouth at bedtime. 90 tablet 3 Taking   Semaglutide ,0.25 or 0.5MG /DOS, (OZEMPIC , 0.25 OR 0.5 MG/DOSE,) 2 MG/1.5ML SOPN Inject 0.5 mg into the skin once a week. 6 mL 1 Taking   Tiotropium Bromide Monohydrate  (SPIRIVA  RESPIMAT) 1.25 MCG/ACT AERS Inhale into the lungs.   Taking   torsemide  (DEMADEX ) 20 MG tablet Take 1 tablet (20 mg total) by mouth daily as needed. For leg edema or sob 30 tablet 0 Taking As Needed   venlafaxine  XR (EFFEXOR -XR) 75 MG 24 hr capsule TAKE 1 CAPSULE BY MOUTH ONCE DAILY. 90 capsule 0 Taking    Assessment: Pharmacy consulted to dose heparin  in this 89 year old female admitted with ACS/NSTEMI.  Pt was on Eliquis  5 mg PO BID PTA,  last dose on 5/21 @ 1900. CrCl = 49.3 ml/min   Date/Time aPTT/HL Rate  Comment 5/22 1259 62/-  900 units/hr Slightly subtherapeutic  5/22 2223        74                  1100 units/hr     therapeutic X 1 5/23 0553        89 />1.10       1100 units/hr     therapeutic X 2   Goal of Therapy:  Heparin  level 0.3-0.7 units/ml aPTT 66 - 102 seconds Monitor platelets by anticoagulation protocol: Yes   Plan:  5/23 @ 0553:  aPTT = 89,  HL = > 1.10 - aPTT therapeutic X 2,  HL elevated from PTA Eliquis   - will continue pt on current rate and recheck aPTT and HL on 5/24 with AM labs.  - use aPTT to guide dosing until correlating with HL  - CBC daily   Daimion Adamcik D, PharmD 05/15/2024,6:57 AM

## 2024-05-15 NOTE — Consult Note (Cosign Needed Addendum)
 Consultation Note Date: 05/15/2024   Patient Name: Alison Richardson  DOB: [birth year]  MRN: [id number]  Age / Sex: 89 y.o., female  PCP: Hadassah Letters, MD Referring Physician: Ree Candy, MD  Reason for Consultation: Establishing goals of care  HPI/Patient Profile: PER H&P "Alison Richardson is a 89 y.o. female with medical history significant for dementia, HTN, DM, CHF, atrial fibrillation on Eliquis  and amiodarone , PAD, recently hospitalized from 5/16 to 5/17 with hypotension that improved with medication adjustment, who presented to the ED brought in by EMS from her facility with shortness of breath for which she received DuoNebs and Solu-Medrol  en route.  She has no history of COPD.  Daughter at bedside contributes to history.  Patient has had no chest pain, lower extremity pain or swelling and no cough or fever. ED course and data review: Tachypneic to the low 30s with elevated blood pressure of 153/105, requiring hypoxic to 87% on 4 L on which she arrived, subsequently transitioned to HFNC and then BiPAP".  Clinical Assessment and Goals of Care:  Notes and labs reviewed.  In to see patient with pharmacist Murphy Arn.  She is currently resting in bed at this time with whom she identifies as her son and his partner at bedside.  She discusses that she has 3 children.  She is widowed.  She states she lives in independent living, but has been more short of breath over the past 2 weeks and has to stop to catch her breath while traversing her apartment.  We discussed her diagnoses, prognosis, GOC, EOL wishes disposition and options.  Created space and opportunity for patient  to explore thoughts and feelings regarding current medical information.   A detailed discussion was had today regarding advanced directives.  Concepts specific to code status, artifical feeding and hydration, IV antibiotics and  rehospitalization were discussed.  The difference between an aggressive medical intervention path and a comfort care path was discussed.  Values and goals of care important to patient and family were attempted to be elicited.  Discussed limitations of medical interventions to prolong quality of life in some situations and discussed the concept of human mortality.    She discusses updates that she has been provided regarding potential CT scan and heart catheterization.  She discusses that she is 89 years old, and that she is ready to die if it is her time as she is a woman of [place] faith and knows she will be going to heaven when she leaves this earth. She and son discuss between themselves different family member's views and beliefs, and how to provide support and comfort for everyone. They discuss previous experiences with comfort care; questions answered.    With discussion, she states she is amenable to treating the treatable as she is hopeful for an improved quality of life.  She is clear that her current quality of life is not acceptable, and she would only want to continue life-prolonging care if her quality of life would improve from what it is.  She  is clear that should she decline she would want to shift to comfort care.  She confirms DNR and DNI status.  She confirms that her daughter, and son who is currently present at bedside would be her surrogate decision makers.    SUMMARY OF RECOMMENDATIONS   DNR/DNI.  Continue to treat treatable.  Patient hopeful for a better quality of life than she currently has.      Primary Diagnoses: Present on Admission:  Acute on chronic diastolic CHF (congestive heart failure) (HCC)  NSTEMI (non-ST elevated myocardial infarction) (HCC)  Paroxysmal atrial fibrillation (HCC)  Hypothyroidism  CAD (coronary artery disease)  Leukocytosis  Depression with anxiety  Cognitive impairment, mild, so stated  PAD (peripheral artery disease) (HCC)  Acute  respiratory failure with hypoxia (HCC)   I have reviewed the medical record, interviewed the patient and family, and examined the patient. The following aspects are pertinent.  Past Medical History:  Diagnosis Date   Acute on chronic congestive heart failure (HCC) 10/07/2020   Arrhythmia    Atrial fibrillation and flutter (HCC) 01/06/2019   Plavix  added to eliquis  10-22 after nstemi       Last Assessment & Plan:    Marked facial hematoma post fall but ct of neck and head reportedly normal. No ha or ataxia now but is using a walker. On eliquis  and plavix      Atrial flutter (HCC) 01/2018   new onset    Basal cell carcinoma of back    Basal cell carcinoma of lip    Cerebral aneurysm    followed by [facility]   CHF (congestive heart failure) (HCC)    DDD (degenerative disc disease), lumbar    superior plate depression, L3 08/18/2014   Diabetes mellitus type II, controlled (HCC)    Diverticulosis    Dysrhythmia    Paroxysmal Supraventricular Tachycardia   Dysthymia    depression   GERD (gastroesophageal reflux disease)    History of falling 04/03/2022   History of meniscal tear    Humeral surgical neck fracture 05/16/2022   Hyperlipidemia    Hypertension    Hypokalemia 12/12/2022   Hypothyroidism    Late onset Alzheimer's disease with behavioral disturbance (HCC)    Leukocytosis 10/07/2020   Mild pulmonary hypertension (HCC)    Moderate asthma without complication 12/12/2022   Multifocal pneumonia 12/09/2022   Overactive bladder    Peripheral vascular disease (HCC)    Puncture wound of forehead 06/11/2023   Seasonal allergic rhinitis    Severe sepsis (HCC) 02/07/2022   Sleep apnea    Social History   Socioeconomic History   Marital status: Widowed    Spouse name: Not on file   Number of children: 5   Years of education: Not on file   Highest education level: Not on file  Occupational History   Not on file  Tobacco Use   Smoking status: Never   Smokeless tobacco: Never   Vaping Use   Vaping status: Never Used  Substance and Sexual Activity   Alcohol use: Not Currently   Drug use: Never   Sexual activity: Not Currently  Other Topics Concern   Not on file  Social History Narrative   Not on file   Social Drivers of Health   Financial Resource Strain: Low Risk  (04/09/2023)   Overall Financial Resource Strain (CARDIA)    Difficulty of Paying Living Expenses: Not hard at all  Food Insecurity: No Food Insecurity (05/14/2024)   Hunger Vital Sign  Worried About Programme researcher, broadcasting/film/video in the Last Year: Never true    Ran Out of Food in the Last Year: Never true  Transportation Needs: No Transportation Needs (05/14/2024)   PRAPARE - Administrator, Civil Service (Medical): No    Lack of Transportation (Non-Medical): No  Physical Activity: Insufficiently Active (04/09/2023)   Exercise Vital Sign    Days of Exercise per Week: 4 days    Minutes of Exercise per Session: 20 min  Stress: No Stress Concern Present (04/09/2023)   [employer] of Occupational Health - Occupational Stress Questionnaire    Feeling of Stress : Not at all  Social Connections: Socially Isolated (05/14/2024)   Social Connection and Isolation Panel [NHANES]    Frequency of Communication with Friends and Family: More than three times a week    Frequency of Social Gatherings with Friends and Family: More than three times a week    Attends Religious Services: Never    Database administrator or Organizations: No    Attends Banker Meetings: Never    Marital Status: Widowed   Family History  Problem Relation Age of Onset   Hypertension Mother    Heart disease Father    CAD Father    Heart disease Sister    Diabetes Sister    Diabetes Paternal Uncle    Sleep apnea Son    Scheduled Meds:  amiodarone   200 mg Oral Daily   aspirin  EC  81 mg Oral Daily   budesonide  (PULMICORT ) nebulizer solution  0.5 mg Nebulization BID   donepezil   10 mg Oral QHS   feeding  supplement  1 Container Oral TID BM   furosemide   40 mg Intravenous BID   insulin  aspart  0-9 Units Subcutaneous TID WC   ipratropium-albuterol   3 mL Nebulization QID   levothyroxine   88 mcg Oral Q0600   multivitamin with minerals  1 tablet Oral Daily   potassium chloride   20 mEq Oral Once   pravastatin   20 mg Oral QHS   [START ON 05/16/2024] predniSONE   20 mg Oral Q breakfast   venlafaxine  XR  75 mg Oral Daily   Continuous Infusions:  heparin  1,050 Units/hr (05/15/24 0805)   promethazine (PHENERGAN) injection (IM or IVPB)     PRN Meds:.acetaminophen , albuterol , guaiFENesin -dextromethorphan , morphine  injection, ondansetron  (ZOFRAN ) IV, mouth rinse, promethazine (PHENERGAN) injection (IM or IVPB) Medications Prior to Admission:  Prior to Admission medications   Medication Sig Start Date End Date Taking? Authorizing Provider  albuterol  (PROVENTIL ) (2.5 MG/3ML) 0.083% nebulizer solution Take 3 mLs (2.5 mg total) by nebulization every 6 (six) hours as needed for wheezing or shortness of breath. 04/03/24  Yes Aileen Alexanders, NP  amiodarone  (PACERONE ) 200 MG tablet Take 1 tablet (200 mg total) by mouth daily. 12/09/23  Yes Gollan, Timothy J, MD  apixaban  (ELIQUIS ) 5 MG TABS tablet Take 1 tablet (5 mg total) by mouth 2 (two) times daily. 12/09/23  Yes Gollan, Timothy J, MD  donepezil  (ARICEPT ) 10 MG tablet TAKE ONE TABLET (10 MG) BY MOUTH AT BEDTIME 04/01/24  Yes Hadassah Letters, MD  gabapentin  (NEURONTIN ) 100 MG capsule Take 1 capsule (100 mg total) by mouth at bedtime. 09/10/23  Yes Hadassah Letters, MD  levothyroxine  (SYNTHROID ) 88 MCG tablet TAKE 1 TABLET EVERY DAY ON EMPTY STOMACHWITH A GLASS OF WATER AT LEAST 30-60 MINBEFORE BREAKFAST 10/31/23  Yes Hadassah Letters, MD  metoprolol  succinate (TOPROL -XL) 25 MG 24 hr tablet Take 0.5 tablets (  12.5 mg total) by mouth daily. Take with or immediately following a meal. 05/12/24  Yes Hadassah Letters, MD  nitroGLYCERIN  (NITROSTAT ) 0.4 MG SL tablet Place 1  tablet (0.4 mg total) under the tongue every 5 (five) minutes as needed for chest pain. 12/09/23  Yes Gollan, Timothy J, MD  potassium chloride  SA (KLOR-CON  M) 20 MEQ tablet Take 1 tablet (20 mEq total) by mouth daily as needed. Take it on the days you take Torsemide  05/09/24  Yes Patel, Sona, MD  pravastatin  (PRAVACHOL ) 20 MG tablet Take 1 tablet (20 mg total) by mouth at bedtime. 12/09/23  Yes Gollan, Timothy J, MD  Semaglutide ,0.25 or 0.5MG /DOS, (OZEMPIC , 0.25 OR 0.5 MG/DOSE,) 2 MG/1.5ML SOPN Inject 0.5 mg into the skin once a week. 05/05/24  Yes Aileen Alexanders, NP  Tiotropium Bromide Monohydrate  (SPIRIVA  RESPIMAT) 1.25 MCG/ACT AERS Inhale into the lungs.   Yes [provider]  torsemide  (DEMADEX ) 20 MG tablet Take 1 tablet (20 mg total) by mouth daily as needed. For leg edema or sob 05/09/24  Yes Patel, Sona, MD  venlafaxine  XR (EFFEXOR -XR) 75 MG 24 hr capsule TAKE 1 CAPSULE BY MOUTH ONCE DAILY. 02/06/24  Yes Hadassah Letters, MD   Allergies  Allergen Reactions   Pantoprazole  Nausea And Vomiting   Metformin And Related Diarrhea   Review of Systems  Constitutional:  Positive for activity change.  Respiratory:  Positive for shortness of breath.     Physical Exam Pulmonary:     Effort: Pulmonary effort is normal.  Skin:    General: Skin is warm and dry.  Neurological:     Mental Status: She is alert.     Vital Signs: BP 127/74 (BP Location: Left Arm)   Pulse 75   Temp 98.6 F (37 C) (Oral)   Resp 17   Ht 5' 5.98" (1.676 m)   Wt 73.4 kg   SpO2 95%   BMI 26.13 kg/m  Pain Scale: 0-10   Pain Score: 0-No pain   SpO2: SpO2: 95 % O2 Device:SpO2: 95 % O2 Flow Rate: .O2 Flow Rate (L/min): 9 L/min  IO: Intake/output summary:  Intake/Output Summary (Last 24 hours) at 05/15/2024 1545 Last data filed at 05/15/2024 1403 Gross per 24 hour  Intake 670.81 ml  Output 1800 ml  Net -1129.19 ml    LBM: Last BM Date :  (PTA last week) Baseline Weight: Weight: 74.8 kg Most  recent weight: Weight: 73.4 kg       Signed by: Meribeth Standard, NP   Please contact Palliative Medicine Team phone at [phone] for questions and concerns.  For individual provider: See Tilford Foley

## 2024-05-15 NOTE — Consult Note (Signed)
 Pulmonary Critical Care  Initial Consult Note  Alison Richardson ZOX:[id number] DOB: 1933/09/07 DOA: 05/14/2024  Referring physician: Laverna Pott MD  Chief Complaint:  shortness of breath  HPI: Alison Richardson is a 89 y.o. female   with the prior history of hypertension dimension diabetes mellitus CHF atrial fibrillation on chronic Eliquis  as well as amiodarone .  Patient also has a history of peripheral arterial disease.  The patient came into the hospital because of increasing shortness of breath.  She was given DuoNebs as well as Solu-Medrol  on the way to the hospital.  There is no previous history of COPD.  The patient has had pulmonary functions done in the past which have been basically normal.  Upon arrival the patient was started on BiPAP.  The patient had a venous blood gas drawn showing a pH 736 pCO2 of 44 and was noted to have an elevated white count.  At the time that she has seen she is awake she is alert appears to be comfortable so is now on nasal cannula.  She denies having any swelling over legs.  She does have some pain in her chest.  No cough no congestion.  No fevers noted.  Review of Systems:  Constitutional:  No weight loss, night sweats, Fevers, chills, fatigue.  HEENT:  No headaches, nasal congestion, post nasal drip,  Cardio-vascular:  +chest pain, Orthopnea, PND, swelling in lower extremities, anasarca, dizziness, palpitations  GI:  No heartburn, indigestion, abdominal pain, nausea, vomiting, diarrhea  Resp:  +shortness of breath. no productive cough, No coughing up of blood.No wheezing Skin:  no rash or lesions.  Musculoskeletal:  No joint pain or swelling.   Remainder ROS performed and is unremarkable other than noted in HPI  Past Medical History:  Diagnosis Date   Acute on chronic congestive heart failure (HCC) 10/07/2020   Arrhythmia    Atrial fibrillation and flutter (HCC) 01/06/2019   Plavix  added to eliquis  10-22 after nstemi       Last Assessment &  Plan:    Marked facial hematoma post fall but ct of neck and head reportedly normal. No ha or ataxia now but is using a walker. On eliquis  and plavix      Atrial flutter (HCC) 01/2018   new onset    Basal cell carcinoma of back    Basal cell carcinoma of lip    Cerebral aneurysm    followed by [facility]   CHF (congestive heart failure) (HCC)    DDD (degenerative disc disease), lumbar    superior plate depression, L3 08/18/2014   Diabetes mellitus type II, controlled (HCC)    Diverticulosis    Dysrhythmia    Paroxysmal Supraventricular Tachycardia   Dysthymia    depression   GERD (gastroesophageal reflux disease)    History of falling 04/03/2022   History of meniscal tear    Humeral surgical neck fracture 05/16/2022   Hyperlipidemia    Hypertension    Hypokalemia 12/12/2022   Hypothyroidism    Late onset Alzheimer's disease with behavioral disturbance (HCC)    Leukocytosis 10/07/2020   Mild pulmonary hypertension (HCC)    Moderate asthma without complication 12/12/2022   Multifocal pneumonia 12/09/2022   Overactive bladder    Peripheral vascular disease (HCC)    Puncture wound of forehead 06/11/2023   Seasonal allergic rhinitis    Severe sepsis (HCC) 02/07/2022   Sleep apnea    Past Surgical History:  Procedure Laterality Date   APPENDECTOMY  1946   BASAL CELL CARCINOMA EXCISION  2006 and 2009 removed from back and lip   CARDIOVASCULAR STRESS TEST  2015   nuclear cardiac stress test negative for ischemia - Dr. Adelina Adu   CATARACT EXTRACTION, BILATERAL  2006   COLONOSCOPY  2014   COLONOSCOPY N/A 08/19/2020   Procedure: COLONOSCOPY;  Surgeon: Shane Darling, MD;  Location: [facility] ENDOSCOPY;  Service: Endoscopy;  Laterality: N/A;   ECTOPIC PREGNANCY SURGERY  1957   ESOPHAGOGASTRODUODENOSCOPY N/A 08/19/2020   Procedure: ESOPHAGOGASTRODUODENOSCOPY (EGD);  Surgeon: Shane Darling, MD;  Location: [facility] ENDOSCOPY;  Service: Endoscopy;  Laterality: N/A;   INJECTION KNEE Left  05/2015   LEFT HEART CATH AND CORONARY ANGIOGRAPHY N/A 10/12/2021   Procedure: LEFT HEART CATH AND CORONARY ANGIOGRAPHY;  Surgeon: Michelle Aid, MD;  Location: [facility] INVASIVE CV LAB;  Service: Cardiovascular;  Laterality: N/A;   REPLACEMENT TOTAL KNEE Left 09/06/2016   Dr. Kita Perish   TONSILLECTOMY AND ADENOIDECTOMY  1953   TOTAL ABDOMINAL HYSTERECTOMY  1986   [facility]   Social History:  reports that she has never smoked. She has never used smokeless tobacco. She reports that she does not currently use alcohol. She reports that she does not use drugs.  Allergies  Allergen Reactions   Pantoprazole  Nausea And Vomiting   Metformin And Related Diarrhea    Family History  Problem Relation Age of Onset   Hypertension Mother    Heart disease Father    CAD Father    Heart disease Sister    Diabetes Sister    Diabetes Paternal Uncle    Sleep apnea Son     Prior to Admission medications   Medication Sig Start Date End Date Taking? Authorizing Provider  albuterol  (PROVENTIL ) (2.5 MG/3ML) 0.083% nebulizer solution Take 3 mLs (2.5 mg total) by nebulization every 6 (six) hours as needed for wheezing or shortness of breath. 04/03/24  Yes Aileen Alexanders, NP  amiodarone  (PACERONE ) 200 MG tablet Take 1 tablet (200 mg total) by mouth daily. 12/09/23  Yes Gollan, Timothy J, MD  apixaban  (ELIQUIS ) 5 MG TABS tablet Take 1 tablet (5 mg total) by mouth 2 (two) times daily. 12/09/23  Yes Gollan, Timothy J, MD  donepezil  (ARICEPT ) 10 MG tablet TAKE ONE TABLET (10 MG) BY MOUTH AT BEDTIME 04/01/24  Yes Hadassah Letters, MD  gabapentin  (NEURONTIN ) 100 MG capsule Take 1 capsule (100 mg total) by mouth at bedtime. 09/10/23  Yes Hadassah Letters, MD  levothyroxine  (SYNTHROID ) 88 MCG tablet TAKE 1 TABLET EVERY DAY ON EMPTY STOMACHWITH A GLASS OF WATER AT LEAST 30-60 MINBEFORE BREAKFAST 10/31/23  Yes Hadassah Letters, MD  metoprolol  succinate (TOPROL -XL) 25 MG 24 hr tablet Take 0.5 tablets (12.5 mg total) by mouth daily.  Take with or immediately following a meal. 05/12/24  Yes Hadassah Letters, MD  nitroGLYCERIN  (NITROSTAT ) 0.4 MG SL tablet Place 1 tablet (0.4 mg total) under the tongue every 5 (five) minutes as needed for chest pain. 12/09/23  Yes Gollan, Timothy J, MD  potassium chloride  SA (KLOR-CON  M) 20 MEQ tablet Take 1 tablet (20 mEq total) by mouth daily as needed. Take it on the days you take Torsemide  05/09/24  Yes Patel, Sona, MD  pravastatin  (PRAVACHOL ) 20 MG tablet Take 1 tablet (20 mg total) by mouth at bedtime. 12/09/23  Yes Gollan, Timothy J, MD  Semaglutide ,0.25 or 0.5MG /DOS, (OZEMPIC , 0.25 OR 0.5 MG/DOSE,) 2 MG/1.5ML SOPN Inject 0.5 mg into the skin once a week. 05/05/24  Yes Aileen Alexanders, NP  Tiotropium Bromide Monohydrate  (SPIRIVA   RESPIMAT) 1.25 MCG/ACT AERS Inhale into the lungs.   Yes [provider]  torsemide  (DEMADEX ) 20 MG tablet Take 1 tablet (20 mg total) by mouth daily as needed. For leg edema or sob 05/09/24  Yes Patel, Sona, MD  venlafaxine  XR (EFFEXOR -XR) 75 MG 24 hr capsule TAKE 1 CAPSULE BY MOUTH ONCE DAILY. 02/06/24  Yes Hadassah Letters, MD   Physical Exam: Vitals:   05/15/24 0800 05/15/24 0948 05/15/24 1100 05/15/24 1200  BP: (!) 102/57  (!) 119/58 127/74  Pulse: 61  66 75  Resp: 14  18 17   Temp: 98.3 F (36.8 C)   98.6 F (37 C)  TempSrc: Oral   Oral  SpO2: 97% 96% 96% 95%  Weight:      Height:       Body mass index is 26.13 kg/m.   Wt Readings from Last 3 Encounters:  05/15/24 73.4 kg  05/08/24 74.8 kg  05/05/24 77.1 kg    General:  Appears calm and comfortable Eyes: PERRL, normal lids, irises & conjunctiva ENT: grossly normal hearing, lips & tongue Neck: no LAD, masses or thyromegaly Cardiovascular: RRR, no m/r/g. No LE edema. Respiratory: CTA bilaterally, no w/r/r.       Normal respiratory effort. Abdomen: soft, nontender Skin: no rash or induration seen on limited exam Musculoskeletal: grossly normal tone BUE/BLE Psychiatric: grossly normal  mood and affect Neurologic: grossly non-focal.          Labs on Admission:  Basic Metabolic Panel: Recent Labs  Lab 05/09/24 0456 05/12/24 1105 05/14/24 0034 05/15/24 0553  NA 144 144 139 136  K 3.4* 4.0 3.5 3.7  CL 112* 106 107 104  CO2 24 19* 19* 22  GLUCOSE 104* 169* 230* 185*  BUN 12 11 13  28*  CREATININE 0.63 0.91 0.75 0.90  CALCIUM  8.6* 9.3 8.9 7.8*  MG  --  2.1  --   --    Liver Function Tests: Recent Labs  Lab 05/14/24 0034  AST 49*  ALT 39  ALKPHOS 89  BILITOT 1.0  PROT 7.2  ALBUMIN 4.0   Recent Labs  Lab 05/14/24 0034  LIPASE 28   No results for input(s): "AMMONIA" in the last 168 hours. CBC: Recent Labs  Lab 05/09/24 0456 05/14/24 0034 05/15/24 0553  WBC 9.2 24.4* 21.4*  NEUTROABS  --  18.0*  --   HGB 11.3* 12.3 10.0*  HCT 35.3* 40.4 33.1*  MCV 83.1 85.2 86.0  PLT 324 405* 313   Cardiac Enzymes: No results for input(s): "CKTOTAL", "CKMB", "CKMBINDEX", "TROPONINI" in the last 168 hours.  BNP (last 3 results) Recent Labs    05/14/24 0034  BNP 290.1*    ProBNP (last 3 results) No results for input(s): "PROBNP" in the last 8760 hours.  CBG: Recent Labs  Lab 05/15/24 0451 05/15/24 1305  GLUCAP 175* 309*    Radiological Exams on Admission: ECHOCARDIOGRAM COMPLETE Result Date: 05/14/2024    ECHOCARDIOGRAM REPORT   Patient Name:   Alison Richardson Date of Exam: 05/14/2024 Medical Rec #:  [id number]      Height:       66.0 in Accession #:    [id number]     Weight:       164.9 lb Date of Birth:  20-Sep-1933       BSA:          1.842 m Patient Age:    89 years       BP:  123/73 mmHg Patient Gender: F              HR:           92 bpm. Exam Location:  [facility] Procedure: 2D Echo, Cardiac Doppler and Color Doppler (Both Spectral and Color            Flow Doppler were utilized during procedure). Indications:     NSTEMI I21.4  History:         Patient has prior history of Echocardiogram examinations, most                  recent 01/01/2024. CHF,  Arrythmias:Atrial Fibrillation; Risk                  Factors:Hypertension.  Sonographer:     Broadus Canes Referring Phys:  [id number] Lanetta Pion Diagnosing Phys: Constancia Delton MD  Sonographer Comments: Suboptimal apical window. IMPRESSIONS  1. Left ventricular ejection fraction, by estimation, is 40 to 45%. The left ventricle has mild to moderately decreased function. The left ventricle demonstrates global hypokinesis. Left ventricular diastolic parameters are indeterminate.  2. Right ventricular systolic function is normal. The right ventricular size is moderately enlarged. There is moderately elevated pulmonary artery systolic pressure. The estimated right ventricular systolic pressure is 54.3 mmHg.  3. The mitral valve is normal in structure. Mild mitral valve regurgitation.  4. Tricuspid valve regurgitation is mild to moderate.  5. The aortic valve is tricuspid. Aortic valve regurgitation is not visualized.  6. The inferior vena cava is normal in size with greater than 50% respiratory variability, suggesting right atrial pressure of 3 mmHg. FINDINGS  Left Ventricle: Left ventricular ejection fraction, by estimation, is 40 to 45%. The left ventricle has mild to moderately decreased function. The left ventricle demonstrates global hypokinesis. The left ventricular internal cavity size was normal in size. There is no left ventricular hypertrophy. Left ventricular diastolic parameters are indeterminate. Right Ventricle: The right ventricular size is moderately enlarged. No increase in right ventricular wall thickness. Right ventricular systolic function is normal. There is moderately elevated pulmonary artery systolic pressure. The tricuspid regurgitant  velocity is 3.58 m/s, and with an assumed right atrial pressure of 3 mmHg, the estimated right ventricular systolic pressure is 54.3 mmHg. Left Atrium: Left atrial size was normal in size. Right Atrium: Right atrial size was normal in size. Pericardium: There is  no evidence of pericardial effusion. Mitral Valve: The mitral valve is normal in structure. Mild mitral valve regurgitation. Tricuspid Valve: The tricuspid valve is normal in structure. Tricuspid valve regurgitation is mild to moderate. Aortic Valve: The aortic valve is tricuspid. Aortic valve regurgitation is not visualized. Aortic valve mean gradient measures 3.0 mmHg. Aortic valve peak gradient measures 4.4 mmHg. Aortic valve area, by VTI measures 1.79 cm. Pulmonic Valve: The pulmonic valve was not well visualized. Pulmonic valve regurgitation is not visualized. Aorta: The aortic root is normal in size and structure. Venous: The inferior vena cava is normal in size with greater than 50% respiratory variability, suggesting right atrial pressure of 3 mmHg. IAS/Shunts: No atrial level shunt detected by color flow Doppler.  LEFT VENTRICLE PLAX 2D LVIDd:         4.00 cm     Diastology LVIDs:         3.40 cm     LV e' medial:    9.57 cm/s LV PW:         0.90 cm     LV E/e' medial:  9.5 LV IVS:        1.10 cm     LV e' lateral:   11.30 cm/s LVOT diam:     2.00 cm     LV E/e' lateral: 8.1 LV SV:         42 LV SV Index:   23 LVOT Area:     3.14 cm  LV Volumes (MOD) LV vol d, MOD A4C: 55.7 ml LV vol s, MOD A4C: 38.3 ml LV SV MOD A4C:     55.7 ml RIGHT VENTRICLE RV Basal diam:  5.20 cm RV Mid diam:    4.00 cm RV S prime:     12.30 cm/s TAPSE (M-mode): 1.7 cm LEFT ATRIUM         Index       RIGHT ATRIUM           Index LA diam:    4.00 cm 2.17 cm/m  RA Area:     16.70 cm                                 RA Volume:   44.30 ml  24.05 ml/m  AORTIC VALVE AV Area (Vmax):    2.03 cm AV Area (Vmean):   1.74 cm AV Area (VTI):     1.79 cm AV Vmax:           105.00 cm/s AV Vmean:          78.550 cm/s AV VTI:            0.234 m AV Peak Grad:      4.4 mmHg AV Mean Grad:      3.0 mmHg LVOT Vmax:         67.90 cm/s LVOT Vmean:        43.500 cm/s LVOT VTI:          0.133 m LVOT/AV VTI ratio: 0.57  AORTA Ao Root diam: 3.20 cm MITRAL  VALVE               TRICUSPID VALVE MV Area (PHT): 3.23 cm    TR Peak grad:   51.3 mmHg MV Decel Time: 235 msec    TR Vmax:        358.00 cm/s MV E velocity: 91.20 cm/s                            SHUNTS                            Systemic VTI:  0.13 m                            Systemic Diam: 2.00 cm Constancia Delton MD Electronically signed by Constancia Delton MD Signature Date/Time: 05/14/2024/4:18:36 PM    Final    DG Chest Portable 1 View Result Date: 05/14/2024 CLINICAL DATA:  Dyspnea, hypoxia, CHF EXAM: PORTABLE CHEST 1 VIEW COMPARISON:  05/08/2024 FINDINGS: The lungs are symmetrically well expanded. Interval development of diffuse interstitial pulmonary edema, in keeping with changes of mild to moderate cardiogenic failure. No pneumothorax or pleural effusion. Cardiac size is mildly enlarged, stable. No acute bone abnormality. IMPRESSION: 1. Mild to moderate cardiogenic failure. Electronically Signed   By: Worthy Heads M.D.   On: 05/14/2024 00:48  Findings Alison Richardson is a 89 y.o. female reports that she has never smoked. She has never used smokeless tobacco.  SPIROMETRY: FVC was 2.33 liters, 95% of predicted FEV1 was 1.88, 114% of predicted FEV1 ratio was 81 FEF 25-75% liters per second was 162% of predicted  LUNG VOLUMES: TLC was 88% of predicted RV was 82% of predicted  DIFFUSION CAPACITY: DLCO was 70% of predicted DLCO/VA was 101% of predicted  FLOW VOLUME LOOP: Nl contour  Impression Spirometry is in normal range  Lung volumes are in normal range DLCO is mildly decreased, DLCO/VA is in normal range Dlco is down a little ? Weight, cariomegaly, effusion, anemia or parenchymal changes. However isolated low dlco is also seen in pulmonary hypertension  Recommendation Clinical correlation  Interpreting Physician Alain Aliment, MD        EKG: Independently reviewed.  Assessment/Plan Principal Problem:   Acute on chronic diastolic CHF (congestive heart  failure) (HCC) Active Problems:   NSTEMI (non-ST elevated myocardial infarction) (HCC)   CAD (coronary artery disease)   Acute respiratory failure with hypoxia (HCC)   Hypothyroidism   Leukocytosis   Paroxysmal atrial fibrillation (HCC)   Depression with anxiety   Cognitive impairment, mild, so stated   PAD (peripheral artery disease) (HCC)   DM type 2 (diabetes mellitus, type 2) (HCC)   Lactic acidosis     Acute respiratory failure with shortness of breath 90 admission.  At this time she actually appears to be quite comfortable.  Cardiology has seen the patient and she has had an elevated troponin up to 282. The chest x-ray showing some appearance of fluid overload although there may also be an interstitial component.  She has been on amiodarone  I have ordered a CT scan of the chest to make certain there is no pulmonary fibrosis.  Also spoke with Dr. Alvenia Aus who was covering for the weekend for Cardiology and they will reassess again continue with diuresis as Orsered.  I am not 100% certain that the pulmonary hypertension is plan overall I think a issue is probably a combined cardiac and pulmonary issue with this time.  As already noted in the past she has had normal lung volumes normal spirometry no evidence of COPD and along with the findings of the interstitial changes could very well be related to amiodarone  lung disease consider stopping if cardiology is in agreement.   Paroxysmal atrial fibrillation currently her rate is controlled will continue with supportive care and monitor along closely.   Oxygen dependence she was on BiPAP now is off of the BiPAP will titrate the oxygen down as tolerated keeping saturations greater than 90%.  Code Status:   Limited code   Family Communication:  no family present  Disposition Plan:  home   Time spent:  67  I have personally obtained a history, examined the patient, evaluated laboratory and imaging results, formulated the assessment and plan and  placed orders.  The Patient requires high complexity decision making for assessment and support. Total Time Spent   Cordie Deters, MD [facility] Pulmonary Critical Care Medicine Sleep Medicine

## 2024-05-16 DIAGNOSIS — I5041 Acute combined systolic (congestive) and diastolic (congestive) heart failure: Secondary | ICD-10-CM | POA: Diagnosis not present

## 2024-05-16 DIAGNOSIS — Z515 Encounter for palliative care: Secondary | ICD-10-CM | POA: Diagnosis not present

## 2024-05-16 DIAGNOSIS — J9601 Acute respiratory failure with hypoxia: Secondary | ICD-10-CM | POA: Diagnosis not present

## 2024-05-16 DIAGNOSIS — I2510 Atherosclerotic heart disease of native coronary artery without angina pectoris: Secondary | ICD-10-CM | POA: Diagnosis not present

## 2024-05-16 DIAGNOSIS — I5033 Acute on chronic diastolic (congestive) heart failure: Secondary | ICD-10-CM | POA: Diagnosis not present

## 2024-05-16 DIAGNOSIS — I509 Heart failure, unspecified: Secondary | ICD-10-CM

## 2024-05-16 DIAGNOSIS — E872 Acidosis, unspecified: Secondary | ICD-10-CM

## 2024-05-16 DIAGNOSIS — Z66 Do not resuscitate: Secondary | ICD-10-CM

## 2024-05-16 DIAGNOSIS — D72829 Elevated white blood cell count, unspecified: Secondary | ICD-10-CM

## 2024-05-16 DIAGNOSIS — Z7189 Other specified counseling: Secondary | ICD-10-CM | POA: Diagnosis not present

## 2024-05-16 DIAGNOSIS — I739 Peripheral vascular disease, unspecified: Secondary | ICD-10-CM

## 2024-05-16 LAB — GLUCOSE, CAPILLARY
Glucose-Capillary: 239 mg/dL — ABNORMAL HIGH (ref 70–99)
Glucose-Capillary: 278 mg/dL — ABNORMAL HIGH (ref 70–99)
Glucose-Capillary: 299 mg/dL — ABNORMAL HIGH (ref 70–99)

## 2024-05-16 LAB — CBC
HCT: 31.6 % — ABNORMAL LOW (ref 36.0–46.0)
Hemoglobin: 10 g/dL — ABNORMAL LOW (ref 12.0–15.0)
MCH: 26.4 pg (ref 26.0–34.0)
MCHC: 31.6 g/dL (ref 30.0–36.0)
MCV: 83.4 fL (ref 80.0–100.0)
Platelets: 311 10*3/uL (ref 150–400)
RBC: 3.79 MIL/uL — ABNORMAL LOW (ref 3.87–5.11)
RDW: 15.6 % — ABNORMAL HIGH (ref 11.5–15.5)
WBC: 19.2 10*3/uL — ABNORMAL HIGH (ref 4.0–10.5)
nRBC: 0 % (ref 0.0–0.2)

## 2024-05-16 LAB — BASIC METABOLIC PANEL WITH GFR
Anion gap: 11 (ref 5–15)
BUN: 22 mg/dL (ref 8–23)
CO2: 25 mmol/L (ref 22–32)
Calcium: 8.6 mg/dL — ABNORMAL LOW (ref 8.9–10.3)
Chloride: 105 mmol/L (ref 98–111)
Creatinine, Ser: 0.83 mg/dL (ref 0.44–1.00)
GFR, Estimated: >60 mL/min (ref >60)
Glucose, Bld: 235 mg/dL — ABNORMAL HIGH (ref 70–99)
Potassium: 4 mmol/L (ref 3.5–5.1)
Sodium: 141 mmol/L (ref 135–145)

## 2024-05-16 LAB — MAGNESIUM: Magnesium: 2.2 mg/dL (ref 1.7–2.4)

## 2024-05-16 LAB — APTT
aPTT: 54 s — ABNORMAL HIGH (ref 24–36)
aPTT: 63 s — ABNORMAL HIGH (ref 24–36)

## 2024-05-16 LAB — TROPONIN I (HIGH SENSITIVITY): Troponin I (High Sensitivity): 67 ng/L — ABNORMAL HIGH (ref <18)

## 2024-05-16 LAB — HEPARIN LEVEL (UNFRACTIONATED): Heparin Unfractionated: >1.1 [IU]/mL — ABNORMAL HIGH (ref 0.30–0.70)

## 2024-05-16 MED ORDER — HEPARIN BOLUS VIA INFUSION
2000.0000 [IU] | Freq: Once | INTRAVENOUS | Status: AC
  Administered 2024-05-16: 2000 [IU] via INTRAVENOUS
  Filled 2024-05-16: qty 2000

## 2024-05-16 MED ORDER — HEPARIN BOLUS VIA INFUSION
1100.0000 [IU] | Freq: Once | INTRAVENOUS | Status: AC
  Administered 2024-05-16: 1100 [IU] via INTRAVENOUS
  Filled 2024-05-16: qty 1100

## 2024-05-16 MED ORDER — INSULIN GLARGINE-YFGN 100 UNIT/ML ~~LOC~~ SOLN
10.0000 [IU] | Freq: Every day | SUBCUTANEOUS | Status: DC
  Administered 2024-05-16 – 2024-05-20 (×4): 10 [IU] via SUBCUTANEOUS
  Filled 2024-05-16 (×5): qty 0.1

## 2024-05-16 NOTE — Progress Notes (Signed)
 PHARMACY - ANTICOAGULATION CONSULT NOTE  Pharmacy Consult for Heparin   Indication: chest pain/ACS  Allergies  Allergen Reactions   Pantoprazole  Nausea And Vomiting   Metformin And Related Diarrhea    Patient Measurements: Height: 5' 5.98" (167.6 cm) Weight: 74.6 kg (164 lb 7.4 oz) IBW/kg (Calculated) : 59.26 HEPARIN  DW (KG): 74.3  Vital Signs: Temp: 98.4 F (36.9 C) (05/24 1546) Temp Source: Oral (05/24 1546) BP: 103/63 (05/24 1200) Pulse Rate: 68 (05/24 1200)  Labs: Recent Labs    05/14/24 0034 05/14/24 0207 05/14/24 0424 05/14/24 0424 05/14/24 1259 05/14/24 2223 05/15/24 0553 05/16/24 0625 05/16/24 1555  HGB 12.3  --   --   --   --   --  10.0* 10.0*  --   HCT 40.4  --   --   --   --   --  33.1* 31.6*  --   PLT 405*  --   --   --   --   --  313 311  --   APTT  --   --  23*   < > 62*   < > 89* 54* 63*  HEPARINUNFRC  --   --  >1.10*  --   --   --  >1.10* >1.10*  --   CREATININE 0.75  --   --   --   --   --  0.90 0.83  --   TROPONINIHS 14 108*  --   --  282*  --   --  67*  --    < > = values in this interval not displayed.    Estimated Creatinine Clearance: 47.4 mL/min (by C-G formula based on SCr of 0.83 mg/dL).   Medical History: Past Medical History:  Diagnosis Date   Acute on chronic congestive heart failure (HCC) 10/07/2020   Arrhythmia    Atrial fibrillation and flutter (HCC) 01/06/2019   Plavix  added to eliquis  10-22 after nstemi       Last Assessment & Plan:    Marked facial hematoma post fall but ct of neck and head reportedly normal. No ha or ataxia now but is using a walker. On eliquis  and plavix      Atrial flutter (HCC) 01/2018   new onset    Basal cell carcinoma of back    Basal cell carcinoma of lip    Cerebral aneurysm    followed by [facility]   CHF (congestive heart failure) (HCC)    DDD (degenerative disc disease), lumbar    superior plate depression, L3 08/18/2014   Diabetes mellitus type II, controlled (HCC)    Diverticulosis     Dysrhythmia    Paroxysmal Supraventricular Tachycardia   Dysthymia    depression   GERD (gastroesophageal reflux disease)    History of falling 04/03/2022   History of meniscal tear    Humeral surgical neck fracture 05/16/2022   Hyperlipidemia    Hypertension    Hypokalemia 12/12/2022   Hypothyroidism    Late onset Alzheimer's disease with behavioral disturbance (HCC)    Leukocytosis 10/07/2020   Mild pulmonary hypertension (HCC)    Moderate asthma without complication 12/12/2022   Multifocal pneumonia 12/09/2022   Overactive bladder    Peripheral vascular disease (HCC)    Puncture wound of forehead 06/11/2023   Seasonal allergic rhinitis    Severe sepsis (HCC) 02/07/2022   Sleep apnea     Medications:  Medications Prior to Admission  Medication Sig Dispense Refill Last Dose/Taking   albuterol  (PROVENTIL ) (2.5 MG/3ML) 0.083% nebulizer  solution Take 3 mLs (2.5 mg total) by nebulization every 6 (six) hours as needed for wheezing or shortness of breath. 75 mL 3 Taking As Needed   amiodarone  (PACERONE ) 200 MG tablet Take 1 tablet (200 mg total) by mouth daily. 90 tablet 3 Taking   apixaban  (ELIQUIS ) 5 MG TABS tablet Take 1 tablet (5 mg total) by mouth 2 (two) times daily. 180 tablet 3 Taking   donepezil  (ARICEPT ) 10 MG tablet TAKE ONE TABLET (10 MG) BY MOUTH AT BEDTIME 90 tablet 0 Taking   gabapentin  (NEURONTIN ) 100 MG capsule Take 1 capsule (100 mg total) by mouth at bedtime.   Taking   levothyroxine  (SYNTHROID ) 88 MCG tablet TAKE 1 TABLET EVERY DAY ON EMPTY STOMACHWITH A GLASS OF WATER AT LEAST 30-60 MINBEFORE BREAKFAST 90 tablet 3 Taking   metoprolol  succinate (TOPROL -XL) 25 MG 24 hr tablet Take 0.5 tablets (12.5 mg total) by mouth daily. Take with or immediately following a meal.   Taking   nitroGLYCERIN  (NITROSTAT ) 0.4 MG SL tablet Place 1 tablet (0.4 mg total) under the tongue every 5 (five) minutes as needed for chest pain. 25 tablet 3 Taking As Needed   potassium chloride  SA  (KLOR-CON  M) 20 MEQ tablet Take 1 tablet (20 mEq total) by mouth daily as needed. Take it on the days you take Torsemide  30 tablet 0 Taking As Needed   pravastatin  (PRAVACHOL ) 20 MG tablet Take 1 tablet (20 mg total) by mouth at bedtime. 90 tablet 3 Taking   Semaglutide ,0.25 or 0.5MG /DOS, (OZEMPIC , 0.25 OR 0.5 MG/DOSE,) 2 MG/1.5ML SOPN Inject 0.5 mg into the skin once a week. 6 mL 1 Taking   Tiotropium Bromide Monohydrate  (SPIRIVA  RESPIMAT) 1.25 MCG/ACT AERS Inhale into the lungs.   Taking   torsemide  (DEMADEX ) 20 MG tablet Take 1 tablet (20 mg total) by mouth daily as needed. For leg edema or sob 30 tablet 0 Taking As Needed   venlafaxine  XR (EFFEXOR -XR) 75 MG 24 hr capsule TAKE 1 CAPSULE BY MOUTH ONCE DAILY. 90 capsule 0 Taking    Assessment: Pharmacy consulted to dose heparin  in this 89 year old female admitted with ACS/NSTEMI.  Pt was on Eliquis  5 mg PO BID PTA,  last dose on 5/21 @ 1900. CrCl = 49.3 ml/min   Date/Time aPTT/HL Rate  Comment 5/22 1259 62/-  900 units/hr Slightly subtherapeutic  5/22 2223        74                  1100 units/hr     therapeutic X 1 5/23 0553        89 />1.10       1100 units/hr     therapeutic X 2  5/24 0625 54/>1.10 1050 units/hr Subtherapeutic 5/24 1555 63/ -   1200 units/hr Subtherapeutic  Goal of Therapy:  Heparin  level 0.3-0.7 units/ml aPTT 66 - 102 seconds Monitor platelets by anticoagulation protocol: Yes   Plan:  aPTT level remains subtherapeutic. No issues with infusion noted. Give heparin  bolus of 1100 units x1 Increase heparin  infusion rate to 1350 units/hour Check aptt level 8 hours after rate change Continue to monitor aPTT and heparin  levels until correlating Monitor CBC and signs/symptoms of bleeding  Thank you for involving pharmacy in this patient's care.   Ananias Balls, PharmD Clinical Pharmacist 05/16/2024 4:38 PM

## 2024-05-16 NOTE — Progress Notes (Signed)
 Progress Note  Patient Name: Alison Richardson Date of Encounter: 05/16/2024  Primary Cardiologist: Gollan  Subjective   Had left-sided chest pain overnight. EKG showed sinus rhythm with a first degree AV block with global st depression and prolonged QTc. Telemetry showing a stable to slightly improved QT. Labs pending. CT chest consistent with volume overload. Remains on HFNC, down to 9 L. Notes left shoulder pain this morning. Dyspnea unchanged.   Inpatient Medications    Scheduled Meds:  amiodarone   200 mg Oral Daily   aspirin  EC  81 mg Oral Daily   budesonide  (PULMICORT ) nebulizer solution  0.5 mg Nebulization BID   donepezil   10 mg Oral QHS   feeding supplement  1 Container Oral TID BM   furosemide   40 mg Intravenous BID   insulin  aspart  0-9 Units Subcutaneous TID WC   ipratropium-albuterol   3 mL Nebulization TID   levothyroxine   88 mcg Oral Q0600   multivitamin with minerals  1 tablet Oral Daily   pravastatin   20 mg Oral QHS   predniSONE   20 mg Oral Q breakfast   venlafaxine  XR  75 mg Oral Daily   Continuous Infusions:  heparin  1,050 Units/hr (05/16/24 0354)   promethazine (PHENERGAN) injection (IM or IVPB)     PRN Meds: acetaminophen , albuterol , guaiFENesin -dextromethorphan , morphine  injection, ondansetron  (ZOFRAN ) IV, mouth rinse, promethazine (PHENERGAN) injection (IM or IVPB)   Vital Signs    Vitals:   05/15/24 2111 05/15/24 2324 05/16/24 0000 05/16/24 0356  BP: 116/61  119/64 133/78  Pulse: 75  82 81  Resp: 16  20 20   Temp: 98.3 F (36.8 C) 98.2 F (36.8 C)  97.8 F (36.6 C)  TempSrc: Oral Oral  Oral  SpO2: 98%  94% 98%  Weight:    74.6 kg  Height:        Intake/Output Summary (Last 24 hours) at 05/16/2024 0717 Last data filed at 05/16/2024 0500 Gross per 24 hour  Intake 426.22 ml  Output 2300 ml  Net -1873.78 ml   Filed Weights   05/14/24 1330 05/15/24 0500 05/16/24 0356  Weight: 74.8 kg 73.4 kg 74.6 kg    Telemetry    Sinus rhythm with  sinus bradycardia with QT 480-530 ms - Personally Reviewed  ECG    sinus rhythm with a first degree AV block with global st depression and prolonged QTc - Personally Reviewed  Physical Exam   GEN: No acute distress.   Neck: No JVD. Cardiac: RRR, no murmurs, rubs, or gallops.  Respiratory: Improving rhonchorous breath sounds bilaterally.  High flow nasal cannula at 9 L. GI: Soft, nontender, non-distended.   MS: No edema; No deformity. Neuro:  Alert and oriented x 3; Nonfocal.  Psych: Normal affect.  Labs    Chemistry Recent Labs  Lab 05/12/24 1105 05/14/24 0034 05/15/24 0553  NA 144 139 136  K 4.0 3.5 3.7  CL 106 107 104  CO2 19* 19* 22  GLUCOSE 169* 230* 185*  BUN 11 13 28*  CREATININE 0.91 0.75 0.90  CALCIUM  9.3 8.9 7.8*  PROT  --  7.2  --   ALBUMIN  --  4.0  --   AST  --  49*  --   ALT  --  39  --   ALKPHOS  --  89  --   BILITOT  --  1.0  --   GFRNONAA  --  >60 >60  ANIONGAP  --  13 10     Hematology Recent Labs  Lab 05/14/24 0034 05/15/24 0553 05/16/24 0625  WBC 24.4* 21.4* 19.2*  RBC 4.74 3.85* 3.79*  HGB 12.3 10.0* 10.0*  HCT 40.4 33.1* 31.6*  MCV 85.2 86.0 83.4  MCH 25.9* 26.0 26.4  MCHC 30.4 30.2 31.6  RDW 15.4 15.7* 15.6*  PLT 405* 313 311    Cardiac EnzymesNo results for input(s): "TROPONINI" in the last 168 hours. No results for input(s): "TROPIPOC" in the last 168 hours.   BNP Recent Labs  Lab 05/14/24 0034  BNP 290.1*     DDimer  Recent Labs  Lab 05/14/24 0423  DDIMER 1.20*     Radiology    DG Chest Portable 1 View Result Date: 05/14/2024 IMPRESSION: 1. Mild to moderate cardiogenic failure. Electronically Signed   By: Worthy Heads M.D.   On: 05/14/2024 00:48    Cardiac Studies   2D echo 05/14/2024: 1. Left ventricular ejection fraction, by estimation, is 40 to 45%. The  left ventricle has mild to moderately decreased function. The left  ventricle demonstrates global hypokinesis. Left ventricular diastolic  parameters  are indeterminate.   2. Right ventricular systolic function is normal. The right ventricular  size is moderately enlarged. There is moderately elevated pulmonary artery  systolic pressure. The estimated right ventricular systolic pressure is  54.3 mmHg.   3. The mitral valve is normal in structure. Mild mitral valve  regurgitation.   4. Tricuspid valve regurgitation is mild to moderate.   5. The aortic valve is tricuspid. Aortic valve regurgitation is not  visualized.   6. The inferior vena cava is normal in size with greater than 50%  respiratory variability, suggesting right atrial pressure of 3 mmHg.  __________  2D echo 12/31/2023: 1. Left ventricular ejection fraction, by estimation, is 60 to 65%. The  left ventricle has normal function. The left ventricle has no regional  wall motion abnormalities. Left ventricular diastolic parameters are  indeterminate.   2. Right ventricular systolic function is normal. The right ventricular  size is normal. There is mildly elevated pulmonary artery systolic  pressure. The estimated right ventricular systolic pressure is 40.8 mmHg.   3. Left atrial size was mild to moderately dilated.   4. The mitral valve is normal in structure. Mild to moderate mitral valve  regurgitation. No evidence of mitral stenosis.   5. Tricuspid valve regurgitation is mild to moderate.   6. The aortic valve has an indeterminant number of cusps. Aortic valve  regurgitation is not visualized. No aortic stenosis is present.   7. The inferior vena cava is normal in size with greater than 50%  respiratory variability, suggesting right atrial pressure of 3 mmHg.  __________   2D echo 04/21/2023: 1. Left ventricular ejection fraction, by estimation, is 55 to 60%. Left  ventricular ejection fraction by PLAX is 67 %. The left ventricle has  normal function. The left ventricle has no regional wall motion  abnormalities. Left ventricular diastolic  parameters are  indeterminate.   2. Right ventricular systolic function is normal. The right ventricular  size is normal. There is mildly elevated pulmonary artery systolic  pressure. The estimated right ventricular systolic pressure is 38.3 mmHg.   3. The mitral valve is normal in structure. Moderate mitral valve  regurgitation. No evidence of mitral stenosis.   4. Tricuspid valve regurgitation is moderate to severe.   5. The aortic valve is tricuspid. Aortic valve regurgitation is not  visualized. No aortic stenosis is present.   6. The inferior vena cava is normal  in size with greater than 50%  respiratory variability, suggesting right atrial pressure of 3 mmHg.  __________   2D echo 12/11/2022: 1. Left ventricular ejection fraction, by estimation, is 50 to 55%. The  left ventricle has low normal function. The left ventricle has no regional  wall motion abnormalities. Left ventricular diastolic parameters were  normal.   2. Right ventricular systolic function is normal. The right ventricular  size is normal.   3. The mitral valve is normal in structure. Mild mitral valve  regurgitation. No evidence of mitral stenosis.   4. The aortic valve is normal in structure. Aortic valve regurgitation is  not visualized. No aortic stenosis is present.   5. The inferior vena cava is normal in size with greater than 50%  respiratory variability, suggesting right atrial pressure of 3 mmHg.  __________   2D echo 10/31/2021: 1. Left ventricular ejection fraction, by estimation, is 45 to 50%. The  left ventricle has mildly decreased function. The left ventricle has no  regional wall motion abnormalities. Left ventricular diastolic parameters  were normal.   2. Right ventricular systolic function is normal. The right ventricular  size is normal.   3. The mitral valve is normal in structure. Mild mitral valve  regurgitation. No evidence of mitral stenosis.   4. The aortic valve is normal in structure. Aortic  valve regurgitation is  not visualized. No aortic stenosis is present.   5. The inferior vena cava is normal in size with greater than 50%  respiratory variability, suggesting right atrial pressure of 3 mmHg.  __________   LHC 10/12/2021:   Ost RCA to Prox RCA lesion is 99% stenosed.   Dist RCA lesion is 65% stenosed.   RPDA lesion is 60% stenosed.   1st Diag lesion is 99% stenosed.   Ost Cx to Prox Cx lesion is 25% stenosed.   Ost LAD to Prox LAD lesion is 25% stenosed.   There is mild left ventricular systolic dysfunction.   LV end diastolic pressure is mildly elevated.   The left ventricular ejection fraction is 35-45% by visual estimate.   89 year old female with known chronic nonvalvular atrial fibrillation hypertension hyperlipidemia with acute non-ST elevation myocardial infarction   Cardiac catheterization showing mild inferior hypokinesis and apical hypokinesis with ejection fraction of 40 to 45%   Severe and/or critical stenosis of ostial right coronary artery and moderate atherosclerosis of distal right coronary artery and PDA complex in nature with good collaterals from left anterior descending artery Mild atherosclerosis of left anterior descending artery and circumflex artery   After discussion with cardiovascular team would best be medically manage at this time due to complex lesions and not ideal for PCI and stent placement   Plan Isosorbide  for better collateral flow 2.  Increase beta-blocker for better heart rate control of atrial fibrillation with a goal heart rate between 60 and 90 bpm 3.  Reinstate anticoagulation for further risk reduction and stroke with atrial fibrillation 4.  Single antiplatelet therapy with Plavix  5.  Hypertension control and high intensity cholesterol therapy 6.  Cardiac rehabilitation __________   2D echo 10/07/2020: 1. Left ventricular ejection fraction, by estimation, is 60 to 65%. The  left ventricle has normal function. The left  ventricle has no regional  wall motion abnormalities. Left ventricular diastolic parameters are  consistent with Grade I diastolic  dysfunction (impaired relaxation).   2. Right ventricular systolic function is normal. The right ventricular  size is normal.   3. The mitral  valve is grossly normal. Mild mitral valve regurgitation.   4. The aortic valve is normal in structure. Aortic valve regurgitation is  trivial.  __________   2D echo 09/29/2019: 1. Left ventricular ejection fraction, by visual estimation, is 60 to  65%. The left ventricle has normal function. Left ventricular septal wall  thickness was mildly increased. Mildly increased left ventricular  posterior wall thickness. There is mildly  increased left ventricular hypertrophy.   2. Global right ventricle has normal systolic function.The right  ventricular size is mildly enlarged. No increase in right ventricular wall  thickness.   3. Left atrial size was mildly dilated.   4. Right atrial size was normal.   5. The mitral valve is grossly normal. Mild mitral valve regurgitation.   6. The tricuspid valve is grossly normal. Tricuspid valve regurgitation  is mild.   7. The aortic valve is tricuspid Aortic valve regurgitation was not  visualized by color flow Doppler.   8. The pulmonic valve was not well visualized. Pulmonic valve  regurgitation is trivial by color flow Doppler.   9. Moderately elevated pulmonary artery systolic pressure.  10. No shunts by color flow doppler.   Patient Profile     89 y.o. female with history of CAD medically managed, persistent A-fib, HFimpEF, valvular heart disease, DM2, HTN, HLD, and hypothyroidism who is being seen today for the evaluation of HFrEF and elevated troponin at the request of Dr. Vallarie Gauze.   Assessment & Plan    1. Acute hypoxic respiratory failure: - Sudden onset of dyspnea on the evening of 5/21 - Remains volume overloaded - Echo with new cardiomyopathy with an EF of 40 to  45% (LV systolic function normal by echo in 12/2023) as well as elevated RVSP estimated at 54 (on BiPAP at that time), and dilated RV - D-dimer elevated at 1.2, consider evaluation for PE given acute hypoxic respiratory failure, dilated RV, and elevated RVSP (however, PE is presumably unlikely as long as she has been adherent to apixaban .  CT of the chest may also help guide ongoing pharmacotherapy, and to evaluate for possible amiodarone  toxicity), will defer to primary service - Degree of hypoxia appears to be out of proportion to heart failure - Wean supplemental oxygen as able - Pulmonology on board - Stop amiodarone  as below   2.  CAD involving the native coronary arteries with elevated high-sensitivity troponin: - Episode of chest pain overnight with left shoulder pain this morning - Troponin trended to 282, repeat -Repeat EKG - On heparin  drip  - PTA aspirin  and statin - Will need R/LHC next week, likely 5/27   3.  Acute on chronic HFrEF with pulmonary hypertension: - EF previously 35 to 45% by LV gram in 09/2021 and 45 to 50% by echo in 10/2021, subsequently normalized -LV systolic function now reduced to 40 to 45% - Remains volume up - Continue IV furosemide  40 mg twice daily for now given ongoing hypoxia and elevated right-sided pressures with close monitoring of urine output and renal function - Not currently on beta-blocker (previously bradycardic in the 40s bpm at office visit in 11/2023 leading to decrease of beta-blocker dose to 50 mg at that time) - Escalate GDMT as able, relative hypotension precludes at this time   3.  Persistent A-fib: - Maintaining sinus rhythm  -TSH normal  - AST mildly elevated this admission with normal ALT, continue with periodic monitoring - CHA2DS2-VASc at least 7 (CHF, HTN, age x 2, DM, vascular disease, sex category) -  Last dose of apixaban  on the evening of 5/21 - IV heparin  while admitted until it is clear she will not require invasive  testing   4.  Valvular heart disease: - Stable  5. Prolonged QT: -Stop amiodarone  -Would also recommend Effexor  be held, will defer to IM -Avoid QT prolonging medications -Repeat EKG     For questions or updates, please contact [facility] Please consult [email or web address] for contact info under Cardiology/STEMI.    Signed, Varney Gentleman, PA-C [facility] Pager: [phone] 05/16/2024, 7:17 AM

## 2024-05-16 NOTE — Progress Notes (Signed)
 Palliative Care Progress Note, Assessment & Plan   Patient Name: Alison Richardson       Date: 05/16/2024 DOB: 01/03/1933  Age: 89 y.o. MRN#: [id number] Attending Physician: Ree Candy, MD Primary Care Physician: Hadassah Letters, MD Admit Date: 05/14/2024  Subjective: Pt endorses continued 6/10, L shoulder pain. Only slept around 4 hours last night. Ate breakfast. Denies chest pain or worsening shortness of breath. Confirms proceeding with cardiac cath planned for next week.   HPI: "Syna Gad is a 89 y.o. female with medical history significant for dementia, HTN, DM, CHF, atrial fibrillation on Eliquis  and amiodarone , PAD, recently hospitalized from 5/16 to 5/17 with hypotension that improved with medication adjustment, who presented to the ED brought in by EMS from her facility with shortness of breath for which she received DuoNebs and Solu-Medrol  en route.  She has no history of COPD.  Daughter at bedside contributes to history.  Patient has had no chest pain, lower extremity pain or swelling and no cough or fever. ED course and data review: Tachypneic to the low 30s with elevated blood pressure of 153/105, requiring hypoxic to 87% on 4 L on which she arrived, subsequently transitioned to HFNC and then BiPAP".    Summary of counseling/coordination of care: Extensive chart review completed prior to meeting patient including labs, vital signs, imaging, progress notes, orders, and available advanced directive documents from current and previous encounters.   After reviewing the patient's chart and assessing the patient at bedside, I spoke with patient in regards to symptom management and goals of care.   Elderly, ill-appearing female resting in bed. She is A&O, calm and pleasant. Even, unlabored  respirations. 9L O2 via [place]. She is in no distress. Son and son-in-law at bedside.   Patient, son and son-in-law voice concern about continuing left shoulder pain. Patient reports that with her last heart attack, she experienced L arm pain. They are concerned that she is at risk for another heart attack. Discussed that with patient's known cardiac history, new oxygen requirement, prolonged QT, A-fib and known CAD, she is at risk for another cardiac event. Reassured that she is being cared for by cardiology who is managing medications and have plan of care in place.   Patient, son, daughter and son-in-law are in agreement to move forward with cardiac intervention planned for next week.    Therapeutic silence and active listening provided for patient and family to share their thoughts and emotions regarding current medical situation.  Emotional support provided.  Family encouraged to contact PMT with questions or concerns.   Physical Exam Vitals reviewed.  Constitutional:      General: She is not in acute distress.    Appearance: She is ill-appearing.  HENT:     Head: Normocephalic and atraumatic.     Nose:     Comments: O2 via [place] [facility]:     Effort: Pulmonary effort is normal. No respiratory distress.  Skin:    General: Skin is warm and dry.  Neurological:     Mental Status: She is alert and oriented to person, place, and time.  Psychiatric:        Mood and Affect: Mood normal.  Behavior: Behavior normal.        Thought Content: Thought content normal.        Judgment: Judgment normal.     Addendum 1520:  Assessed patient at bedside for response to IV morphine . Patient reports that she had "instant relief" from L shoulder pain with morphine , but no noticeable effect on her breathing status. She was able to take a nap. Patient or daughter have no concerns at this time.   Recommendations/Plan: Continue DNR/DNI status as previously documented    Continue current supportive  interventions PMT to continue to follow for support       Total Time 65  minutes   Time spent includes: Detailed review of medical records (labs, imaging, vital signs), medically appropriate exam (mental status, respiratory, cardiac, skin), discussed with treatment team, counseling and educating patient, family and staff, documenting clinical information, medication management and coordination of care.     Ina Manas, [facility] Palliative Medicine Team  05/16/2024 10:03 AM  Office [phone]  Pager [phone]

## 2024-05-16 NOTE — Progress Notes (Signed)
 PROGRESS NOTE Alison Richardson    DOB: 05/10/1933, 89 y.o.  FAO:[id number]    Code Status: Limited: Do not attempt resuscitation (DNR) -DNR-LIMITED -Do Not Intubate/DNI    DOA: 05/14/2024   LOS: 2   Brief hospital course  Alison Richardson is a 89 y.o. female with medical history significant for dementia, HTN, DM, CHF, atrial fibrillation on Eliquis  and amiodarone , PAD, recently hospitalized from 5/16 to 5/17 with hypotension that improved with medication adjustment, who presented to the ED with shortness of breath for which she received DuoNebs and Solu-Medrol  en route.    ED course: Tachypneic to the low 30s with elevated blood pressure of 153/105, hypoxic to 87% on 4 L when she arrived, subsequently transitioned to HFNC and then BiPAP Labs notable for: VBG on BiPAP FiO2 60% with pH 7.36 and PCO244. WBC of 24,000 with lactic acid 2.8 and negative respiratory viral panel. Troponin 14--108 with BNP 290. Lipase and LFTs unremarkable  EKG: sinus at 70 with no acute ST-T wave changes Chest x-ray mild to moderate cardiogenic failure  Remains chest pain free and on heparin  gtt while awaiting ischemic evaluation/cath tentatively planned for 5/27. Cardiology consulted.  Respiratory status remains precarious on high O2 requirement but she is gradually improving. Being treated with steroids, antibiotics, breathing treatments. Pulmonology consulted.   Encouraging OOB starting today if tolerated.   Assessment & Plan  Principal Problem:   Acute on chronic diastolic CHF (congestive heart failure) (HCC) Active Problems:   NSTEMI (non-ST elevated myocardial infarction) (HCC)   CAD (coronary artery disease)   Acute respiratory failure with hypoxia (HCC)   Hypothyroidism   Leukocytosis   Paroxysmal atrial fibrillation (HCC)   Depression with anxiety   Cognitive impairment, mild, so stated   PAD (peripheral artery disease) (HCC)   DM type 2 (diabetes mellitus, type 2) (HCC)   Lactic acidosis  Acute on  chronic diastolic CHF- Signs of pulmonary edema on imaging. Had echo January 2025 with EF 60 to 65%. Repeat echo this admission showed EF 40-45%, global hypokinesis, moderately enlarged RV, moderately elevated pulmonary artery pressure. Mild-mod TV regurg. - Continue IV Lasix  - weaned off nitroglycerin  gtt - Hypotension precludes from further GDMT at this time.  - cardiology following Daily weights with intake and output monitoring   NSTEMI  CAD- Troponin 14--108, without acute EKG changes or complaints of chest pain On heparin  infusion so will hold Eliquis  Continue pravastatin .  Continue metoprolol  as BP would tolerate - tentatively planning heart cath 5/27 once respiratory status has stabilized.    Acute respiratory failure with hypoxia- no baseline O2 requirement Differential includes NSTEMI, acute bronchitis. PE less likely with chronic eliquis . Weaned from bipap. Echo shows significant increase in pulmonary pressures but may have been completed while on bipap so would interfere with results. Pulmonology consulted to review.  Per report: patient does not want to be intubated and would want to transition to comfort care measures with as needed morphine /fentanyl  for air hunger if needed--she is agreeable to fentanyl /morphine  as needed - breathing treatments - steroids  - diuresis as above - palliative consulted - consulting pulmonology, Dr. Meredeth Stallion   Paroxysmal atrial fibrillation (HCC) On heparin  infusion so we will hold apixaban  - discontinued amiodarone  and metoprolol  in setting of low BP/HR    Hypothyroidism Continue levothyroxine    Depression with anxiety Continue venlafaxine    DM type 2 (diabetes mellitus, type 2) (HCC) Sliding scale insulin  coverage- added semglee  as CBGs increased on steroid use   PAD (peripheral artery disease) (  HCC) Continue pravastatin  Currently on aspirin    Cognitive impairment, mild, so stated Delirium precautions  Body mass index is 26.56  kg/m.  VTE ppx: heparin  bolus via infusion 2,000 Units Start: 05/16/24 0830heparin  Diet:     Diet   Diet clear liquid Room service appropriate? Yes; Fluid consistency: Thin   Consultants: Cardiology  Pulmonology   Subjective 05/16/24    Pt reports that she is overall doing well. She has not been out of bed in several days and requests to get to chair today.    Objective  Blood pressure (!) 148/89, pulse 65, temperature 97.9 F (36.6 C), temperature source Oral, resp. rate (!) 22, SpO2 96%.  Intake/Output Summary (Last 24 hours) at 05/16/2024 0800 Last data filed at 05/16/2024 0500 Gross per 24 hour  Intake 426.22 ml  Output 2300 ml  Net -1873.78 ml   Filed Weights   05/14/24 1330 05/15/24 0500 05/16/24 0356  Weight: 74.8 kg 73.4 kg 74.6 kg   Physical Exam Vitals and nursing note reviewed.  Constitutional:      General: She is not in acute distress.    Appearance: She is not ill-appearing.  HENT:     Head: Normocephalic.     Mouth/Throat:     Mouth: Mucous membranes are moist.  Cardiovascular:     Rate and Rhythm: Normal rate and regular rhythm.  Pulmonary:     Effort: Pulmonary effort is normal.     Breath sounds: Decreased breath sounds present.  Musculoskeletal:     Right lower leg: No edema.     Left lower leg: No edema.  Skin:    General: Skin is warm and dry.  Neurological:     General: No focal deficit present.     Mental Status: She is alert.  Psychiatric:        Mood and Affect: Mood normal.   Labs   I have personally reviewed the following labs and imaging studies CBC    Component Value Date/Time   WBC 19.2 (H) 05/16/2024 0625   RBC 3.79 (L) 05/16/2024 0625   HGB 10.0 (L) 05/16/2024 0625   HGB 11.2 04/13/2024 1357   HCT 31.6 (L) 05/16/2024 0625   HCT 35.7 04/13/2024 1357   PLT 311 05/16/2024 0625   PLT 419 04/13/2024 1357   MCV 83.4 05/16/2024 0625   MCV 85 04/13/2024 1357   MCH 26.4 05/16/2024 0625   MCHC 31.6 05/16/2024 0625   RDW  15.6 (H) 05/16/2024 0625   RDW 14.6 04/13/2024 1357   LYMPHSABS 4.5 (H) 05/14/2024 0034   LYMPHSABS 2.5 04/13/2024 1357   MONOABS 1.4 (H) 05/14/2024 0034   EOSABS 0.3 05/14/2024 0034   EOSABS 0.2 04/13/2024 1357   BASOSABS 0.1 05/14/2024 0034   BASOSABS 0.1 04/13/2024 1357      Latest Ref Rng & Units 05/16/2024    6:25 AM 05/15/2024    5:53 AM 05/14/2024   12:34 AM  BMP  Glucose 70 - 99 mg/dL 161  096  045   BUN 8 - 23 mg/dL 22  28  13    Creatinine 0.44 - 1.00 mg/dL 4.09  8.11  9.14   Sodium 135 - 145 mmol/L 141  136  139   Potassium 3.5 - 5.1 mmol/L 4.0  3.7  3.5   Chloride 98 - 111 mmol/L 105  104  107   CO2 22 - 32 mmol/L 25  22  19    Calcium  8.9 - 10.3 mg/dL 8.6  7.8  8.9     CT CHEST WO CONTRAST Result Date: 05/15/2024 CLINICAL DATA:  Dyspnea EXAM: CT CHEST WITHOUT CONTRAST TECHNIQUE: Multidetector CT imaging of the chest was performed following the standard protocol without IV contrast. RADIATION DOSE REDUCTION: This exam was performed according to the departmental dose-optimization program which includes automated exposure control, adjustment of the mA and/or kV according to patient size and/or use of iterative reconstruction technique. COMPARISON:  Chest x-ray 05/14/2024, CT chest 12/10/2022 FINDINGS: Cardiovascular: Limited evaluation without intravenous contrast. Moderate aortic atherosclerosis. No aneurysm. Cardiomegaly. Coronary vascular calcification. No pericardial effusion Mediastinum/Nodes: Patent trachea. No thyroid  mass. Multiple mildly enlarged lymph nodes. Right paratracheal node up to 10 mm. Precarinal node measuring up to 14 mm. Esophagus within normal limits. Small hiatal hernia Lungs/Pleura: Small moderate bilateral pleural effusions. Diffuse septal thickening. Multifocal bilateral ground-glass densities. Partial atelectasis in the lower lobes. No pneumothorax Upper Abdomen: No acute finding. Musculoskeletal: No acute osseous abnormality. Multilevel degenerative  changes. IMPRESSION: 1. Cardiomegaly with small to moderate bilateral pleural effusions and diffuse septal thickening. Multifocal bilateral ground-glass densities, findings are favored to represent pulmonary edema. Superimposed infection not excluded. 2. Mild mediastinal adenopathy, likely reactive. 3. Aortic atherosclerosis. Aortic Atherosclerosis (ICD10-I70.0). Electronically Signed   By: Esmeralda Hedge M.D.   On: 05/15/2024 23:30   ECHOCARDIOGRAM COMPLETE Result Date: 05/14/2024    ECHOCARDIOGRAM REPORT   Patient Name:   Alison Richardson Date of Exam: 05/14/2024 Medical Rec #:  [id number]      Height:       66.0 in Accession #:    [id number]     Weight:       164.9 lb Date of Birth:  [birth year]       BSA:          1.842 m Patient Age:    89 years       BP:           123/73 mmHg Patient Gender: F              HR:           92 bpm. Exam Location:  [facility] Procedure: 2D Echo, Cardiac Doppler and Color Doppler (Both Spectral and Color            Flow Doppler were utilized during procedure). Indications:     NSTEMI I21.4  History:         Patient has prior history of Echocardiogram examinations, most                  recent 01/01/2024. CHF, Arrythmias:Atrial Fibrillation; Risk                  Factors:Hypertension.  Sonographer:     Broadus Canes Referring Phys:  [id number] Lanetta Pion Diagnosing Phys: Constancia Delton MD  Sonographer Comments: Suboptimal apical window. IMPRESSIONS  1. Left ventricular ejection fraction, by estimation, is 40 to 45%. The left ventricle has mild to moderately decreased function. The left ventricle demonstrates global hypokinesis. Left ventricular diastolic parameters are indeterminate.  2. Right ventricular systolic function is normal. The right ventricular size is moderately enlarged. There is moderately elevated pulmonary artery systolic pressure. The estimated right ventricular systolic pressure is 54.3 mmHg.  3. The mitral valve is normal in structure. Mild mitral valve regurgitation.  4.  Tricuspid valve regurgitation is mild to moderate.  5. The aortic valve is tricuspid. Aortic valve regurgitation is not visualized.  6. The inferior vena cava is normal in size with greater than  50% respiratory variability, suggesting right atrial pressure of 3 mmHg. FINDINGS  Left Ventricle: Left ventricular ejection fraction, by estimation, is 40 to 45%. The left ventricle has mild to moderately decreased function. The left ventricle demonstrates global hypokinesis. The left ventricular internal cavity size was normal in size. There is no left ventricular hypertrophy. Left ventricular diastolic parameters are indeterminate. Right Ventricle: The right ventricular size is moderately enlarged. No increase in right ventricular wall thickness. Right ventricular systolic function is normal. There is moderately elevated pulmonary artery systolic pressure. The tricuspid regurgitant  velocity is 3.58 m/s, and with an assumed right atrial pressure of 3 mmHg, the estimated right ventricular systolic pressure is 54.3 mmHg. Left Atrium: Left atrial size was normal in size. Right Atrium: Right atrial size was normal in size. Pericardium: There is no evidence of pericardial effusion. Mitral Valve: The mitral valve is normal in structure. Mild mitral valve regurgitation. Tricuspid Valve: The tricuspid valve is normal in structure. Tricuspid valve regurgitation is mild to moderate. Aortic Valve: The aortic valve is tricuspid. Aortic valve regurgitation is not visualized. Aortic valve mean gradient measures 3.0 mmHg. Aortic valve peak gradient measures 4.4 mmHg. Aortic valve area, by VTI measures 1.79 cm. Pulmonic Valve: The pulmonic valve was not well visualized. Pulmonic valve regurgitation is not visualized. Aorta: The aortic root is normal in size and structure. Venous: The inferior vena cava is normal in size with greater than 50% respiratory variability, suggesting right atrial pressure of 3 mmHg. IAS/Shunts: No atrial level  shunt detected by color flow Doppler.  LEFT VENTRICLE PLAX 2D LVIDd:         4.00 cm     Diastology LVIDs:         3.40 cm     LV e' medial:    9.57 cm/s LV PW:         0.90 cm     LV E/e' medial:  9.5 LV IVS:        1.10 cm     LV e' lateral:   11.30 cm/s LVOT diam:     2.00 cm     LV E/e' lateral: 8.1 LV SV:         42 LV SV Index:   23 LVOT Area:     3.14 cm  LV Volumes (MOD) LV vol d, MOD A4C: 55.7 ml LV vol s, MOD A4C: 38.3 ml LV SV MOD A4C:     55.7 ml RIGHT VENTRICLE RV Basal diam:  5.20 cm RV Mid diam:    4.00 cm RV S prime:     12.30 cm/s TAPSE (M-mode): 1.7 cm LEFT ATRIUM         Index       RIGHT ATRIUM           Index LA diam:    4.00 cm 2.17 cm/m  RA Area:     16.70 cm                                 RA Volume:   44.30 ml  24.05 ml/m  AORTIC VALVE AV Area (Vmax):    2.03 cm AV Area (Vmean):   1.74 cm AV Area (VTI):     1.79 cm AV Vmax:           105.00 cm/s AV Vmean:          78.550 cm/s AV VTI:  0.234 m AV Peak Grad:      4.4 mmHg AV Mean Grad:      3.0 mmHg LVOT Vmax:         67.90 cm/s LVOT Vmean:        43.500 cm/s LVOT VTI:          0.133 m LVOT/AV VTI ratio: 0.57  AORTA Ao Root diam: 3.20 cm MITRAL VALVE               TRICUSPID VALVE MV Area (PHT): 3.23 cm    TR Peak grad:   51.3 mmHg MV Decel Time: 235 msec    TR Vmax:        358.00 cm/s MV E velocity: 91.20 cm/s                            SHUNTS                            Systemic VTI:  0.13 m                            Systemic Diam: 2.00 cm Constancia Delton MD Electronically signed by Constancia Delton MD Signature Date/Time: 05/14/2024/4:18:36 PM    Final     Disposition Plan & Communication  Patient status: Inpatient  Admitted From: Home Planned disposition location: TBD Anticipated discharge date: TBD pending clinical course  Family Communication: sons at bedside    Author: Ree Candy, DO [facility] 05/16/2024, 8:00 AM   Available by Epic secure chat 7AM-7PM. If 7PM-7AM, please contact  night-coverage.  [facility] contact information found on [email or web address].

## 2024-05-16 NOTE — Plan of Care (Signed)
  Problem: Education: Goal: Understanding of cardiac disease, CV risk reduction, and recovery process will improve Outcome: Progressing Goal: Individualized Educational Video(s) Outcome: Progressing   Problem: Activity: Goal: Ability to tolerate increased activity will improve Outcome: Progressing   Problem: Cardiac: Goal: Ability to achieve and maintain adequate cardiovascular perfusion will improve Outcome: Progressing   Problem: Health Behavior/Discharge Planning: Goal: Ability to safely manage health-related needs after discharge will improve Outcome: Progressing   Problem: Education: Goal: Knowledge of General Education information will improve Description: Including pain rating scale, medication(s)/side effects and non-pharmacologic comfort measures Outcome: Progressing   Problem: Health Behavior/Discharge Planning: Goal: Ability to manage health-related needs will improve Outcome: Progressing   Problem: Clinical Measurements: Goal: Ability to maintain clinical measurements within normal limits will improve Outcome: Progressing Goal: Will remain free from infection Outcome: Progressing Goal: Diagnostic test results will improve Outcome: Progressing Goal: Respiratory complications will improve Outcome: Progressing Goal: Cardiovascular complication will be avoided Outcome: Progressing   Problem: Activity: Goal: Risk for activity intolerance will decrease Outcome: Progressing   Problem: Nutrition: Goal: Adequate nutrition will be maintained Outcome: Progressing   Problem: Coping: Goal: Level of anxiety will decrease Outcome: Progressing   Problem: Elimination: Goal: Will not experience complications related to bowel motility Outcome: Progressing Goal: Will not experience complications related to urinary retention Outcome: Progressing   Problem: Pain Managment: Goal: General experience of comfort will improve and/or be controlled Outcome: Progressing

## 2024-05-16 NOTE — Progress Notes (Signed)
 EKG obtained this morning.   Critical test of long QTc of 601.   Dr. Alva Jewels notified.

## 2024-05-16 NOTE — Progress Notes (Signed)
 PHARMACY - ANTICOAGULATION CONSULT NOTE  Pharmacy Consult for Heparin   Indication: chest pain/ACS  Allergies  Allergen Reactions   Pantoprazole  Nausea And Vomiting   Metformin And Related Diarrhea    Patient Measurements: Height: 5' 5.98" (167.6 cm) Weight: 74.6 kg (164 lb 7.4 oz) IBW/kg (Calculated) : 59.26 HEPARIN  DW (KG): 74.3  Vital Signs: Temp: 97.8 F (36.6 C) (05/24 0356) Temp Source: Oral (05/24 0356) BP: 133/78 (05/24 0356) Pulse Rate: 81 (05/24 0356)  Labs: Recent Labs    05/14/24 0034 05/14/24 0207 05/14/24 0424 05/14/24 0424 05/14/24 1259 05/14/24 2223 05/15/24 0553 05/16/24 0625  HGB 12.3  --   --   --   --   --  10.0* 10.0*  HCT 40.4  --   --   --   --   --  33.1* 31.6*  PLT 405*  --   --   --   --   --  313 311  APTT  --   --  23*   < > 62* 74* 89* 54*  HEPARINUNFRC  --   --  >1.10*  --   --   --  >1.10* >1.10*  CREATININE 0.75  --   --   --   --   --  0.90  --   TROPONINIHS 14 108*  --   --  282*  --   --   --    < > = values in this interval not displayed.    Estimated Creatinine Clearance: 43.8 mL/min (by C-G formula based on SCr of 0.9 mg/dL).   Medical History: Past Medical History:  Diagnosis Date   Acute on chronic congestive heart failure (HCC) 10/07/2020   Arrhythmia    Atrial fibrillation and flutter (HCC) 01/06/2019   Plavix  added to eliquis  10-22 after nstemi       Last Assessment & Plan:    Marked facial hematoma post fall but ct of neck and head reportedly normal. No ha or ataxia now but is using a walker. On eliquis  and plavix      Atrial flutter (HCC) 01/2018   new onset    Basal cell carcinoma of back    Basal cell carcinoma of lip    Cerebral aneurysm    followed by [facility]   CHF (congestive heart failure) (HCC)    DDD (degenerative disc disease), lumbar    superior plate depression, L3 08/18/2014   Diabetes mellitus type II, controlled (HCC)    Diverticulosis    Dysrhythmia    Paroxysmal Supraventricular Tachycardia    Dysthymia    depression   GERD (gastroesophageal reflux disease)    History of falling 04/03/2022   History of meniscal tear    Humeral surgical neck fracture 05/16/2022   Hyperlipidemia    Hypertension    Hypokalemia 12/12/2022   Hypothyroidism    Late onset Alzheimer's disease with behavioral disturbance (HCC)    Leukocytosis 10/07/2020   Mild pulmonary hypertension (HCC)    Moderate asthma without complication 12/12/2022   Multifocal pneumonia 12/09/2022   Overactive bladder    Peripheral vascular disease (HCC)    Puncture wound of forehead 06/11/2023   Seasonal allergic rhinitis    Severe sepsis (HCC) 02/07/2022   Sleep apnea     Medications:  Medications Prior to Admission  Medication Sig Dispense Refill Last Dose/Taking   albuterol  (PROVENTIL ) (2.5 MG/3ML) 0.083% nebulizer solution Take 3 mLs (2.5 mg total) by nebulization every 6 (six) hours as needed for wheezing or shortness of  breath. 75 mL 3 Taking As Needed   amiodarone  (PACERONE ) 200 MG tablet Take 1 tablet (200 mg total) by mouth daily. 90 tablet 3 Taking   apixaban  (ELIQUIS ) 5 MG TABS tablet Take 1 tablet (5 mg total) by mouth 2 (two) times daily. 180 tablet 3 Taking   donepezil  (ARICEPT ) 10 MG tablet TAKE ONE TABLET (10 MG) BY MOUTH AT BEDTIME 90 tablet 0 Taking   gabapentin  (NEURONTIN ) 100 MG capsule Take 1 capsule (100 mg total) by mouth at bedtime.   Taking   levothyroxine  (SYNTHROID ) 88 MCG tablet TAKE 1 TABLET EVERY DAY ON EMPTY STOMACHWITH A GLASS OF WATER AT LEAST 30-60 MINBEFORE BREAKFAST 90 tablet 3 Taking   metoprolol  succinate (TOPROL -XL) 25 MG 24 hr tablet Take 0.5 tablets (12.5 mg total) by mouth daily. Take with or immediately following a meal.   Taking   nitroGLYCERIN  (NITROSTAT ) 0.4 MG SL tablet Place 1 tablet (0.4 mg total) under the tongue every 5 (five) minutes as needed for chest pain. 25 tablet 3 Taking As Needed   potassium chloride  SA (KLOR-CON  M) 20 MEQ tablet Take 1 tablet (20 mEq total) by  mouth daily as needed. Take it on the days you take Torsemide  30 tablet 0 Taking As Needed   pravastatin  (PRAVACHOL ) 20 MG tablet Take 1 tablet (20 mg total) by mouth at bedtime. 90 tablet 3 Taking   Semaglutide ,0.25 or 0.5MG /DOS, (OZEMPIC , 0.25 OR 0.5 MG/DOSE,) 2 MG/1.5ML SOPN Inject 0.5 mg into the skin once a week. 6 mL 1 Taking   Tiotropium Bromide Monohydrate  (SPIRIVA  RESPIMAT) 1.25 MCG/ACT AERS Inhale into the lungs.   Taking   torsemide  (DEMADEX ) 20 MG tablet Take 1 tablet (20 mg total) by mouth daily as needed. For leg edema or sob 30 tablet 0 Taking As Needed   venlafaxine  XR (EFFEXOR -XR) 75 MG 24 hr capsule TAKE 1 CAPSULE BY MOUTH ONCE DAILY. 90 capsule 0 Taking    Assessment: Pharmacy consulted to dose heparin  in this 89 year old female admitted with ACS/NSTEMI.  Pt was on Eliquis  5 mg PO BID PTA,  last dose on 5/21 @ 1900. CrCl = 49.3 ml/min   Date/Time aPTT/HL Rate  Comment 5/22 1259 62/-  900 units/hr Slightly subtherapeutic  5/22 2223        74                  1100 units/hr     therapeutic X 1 5/23 0553        89 />1.10       1100 units/hr     therapeutic X 2  5/24 0625 54/>1.10 1050 units/hr Subtherapeutic  Goal of Therapy:  Heparin  level 0.3-0.7 units/ml aPTT 66 - 102 seconds Monitor platelets by anticoagulation protocol: Yes   Plan:  5/24 @ 0625:  aPTT = 54,  HL = > 1.10 - aPTT subtherapeutic, HL still elevated from PTA Eliquis   - will order heparin  bolus of 2000 units x 1 and increase heparin  drip rate to 1200 units/hr and recheck aPTT in 8 hrs - use aPTT to guide dosing until correlating with HL  - CBC daily   Ekta Dancer A, PharmD 05/16/2024,7:34 AM

## 2024-05-16 NOTE — Plan of Care (Signed)
  Problem: Activity: Goal: Ability to tolerate increased activity will improve Outcome: Progressing   Problem: Cardiac: Goal: Ability to achieve and maintain adequate cardiovascular perfusion will improve Outcome: Progressing   Problem: Education: Goal: Knowledge of General Education information will improve Description: Including pain rating scale, medication(s)/side effects and non-pharmacologic comfort measures Outcome: Progressing   Problem: Clinical Measurements: Goal: Ability to maintain clinical measurements within normal limits will improve Outcome: Progressing Goal: Will remain free from infection Outcome: Progressing Goal: Cardiovascular complication will be avoided Outcome: Progressing   Problem: Nutrition: Goal: Adequate nutrition will be maintained Outcome: Progressing   Problem: Elimination: Goal: Will not experience complications related to bowel motility Outcome: Progressing Goal: Will not experience complications related to urinary retention Outcome: Progressing   Problem: Pain Managment: Goal: General experience of comfort will improve and/or be controlled Outcome: Progressing

## 2024-05-17 DIAGNOSIS — I480 Paroxysmal atrial fibrillation: Secondary | ICD-10-CM

## 2024-05-17 DIAGNOSIS — I2510 Atherosclerotic heart disease of native coronary artery without angina pectoris: Secondary | ICD-10-CM | POA: Diagnosis not present

## 2024-05-17 DIAGNOSIS — Z515 Encounter for palliative care: Secondary | ICD-10-CM | POA: Diagnosis not present

## 2024-05-17 DIAGNOSIS — I5043 Acute on chronic combined systolic (congestive) and diastolic (congestive) heart failure: Secondary | ICD-10-CM | POA: Diagnosis not present

## 2024-05-17 DIAGNOSIS — Z7189 Other specified counseling: Secondary | ICD-10-CM | POA: Diagnosis not present

## 2024-05-17 DIAGNOSIS — J9601 Acute respiratory failure with hypoxia: Secondary | ICD-10-CM | POA: Diagnosis not present

## 2024-05-17 DIAGNOSIS — I509 Heart failure, unspecified: Secondary | ICD-10-CM | POA: Diagnosis not present

## 2024-05-17 LAB — APTT
aPTT: 71 s — ABNORMAL HIGH (ref 24–36)
aPTT: 60 s — ABNORMAL HIGH (ref 24–36)
aPTT: 52 s — ABNORMAL HIGH (ref 24–36)

## 2024-05-17 LAB — BASIC METABOLIC PANEL WITH GFR
Anion gap: 13 (ref 5–15)
BUN: 21 mg/dL (ref 8–23)
CO2: 26 mmol/L (ref 22–32)
Calcium: 8.6 mg/dL — ABNORMAL LOW (ref 8.9–10.3)
Chloride: 100 mmol/L (ref 98–111)
Creatinine, Ser: 0.75 mg/dL (ref 0.44–1.00)
GFR, Estimated: >60 mL/min (ref >60)
Glucose, Bld: 137 mg/dL — ABNORMAL HIGH (ref 70–99)
Potassium: 3.3 mmol/L — ABNORMAL LOW (ref 3.5–5.1)
Sodium: 139 mmol/L (ref 135–145)

## 2024-05-17 LAB — CBC
HCT: 29.9 % — ABNORMAL LOW (ref 36.0–46.0)
Hemoglobin: 9.6 g/dL — ABNORMAL LOW (ref 12.0–15.0)
MCH: 26.5 pg (ref 26.0–34.0)
MCHC: 32.1 g/dL (ref 30.0–36.0)
MCV: 82.6 fL (ref 80.0–100.0)
Platelets: 296 10*3/uL (ref 150–400)
RBC: 3.62 MIL/uL — ABNORMAL LOW (ref 3.87–5.11)
RDW: 15.5 % (ref 11.5–15.5)
WBC: 17.8 10*3/uL — ABNORMAL HIGH (ref 4.0–10.5)
nRBC: 0 % (ref 0.0–0.2)

## 2024-05-17 LAB — GLUCOSE, CAPILLARY
Glucose-Capillary: 139 mg/dL — ABNORMAL HIGH (ref 70–99)
Glucose-Capillary: 135 mg/dL — ABNORMAL HIGH (ref 70–99)
Glucose-Capillary: 237 mg/dL — ABNORMAL HIGH (ref 70–99)
Glucose-Capillary: 356 mg/dL — ABNORMAL HIGH (ref 70–99)

## 2024-05-17 LAB — HEPARIN LEVEL (UNFRACTIONATED): Heparin Unfractionated: 1.05 [IU]/mL — ABNORMAL HIGH (ref 0.30–0.70)

## 2024-05-17 MED ORDER — HEPARIN BOLUS VIA INFUSION
2200.0000 [IU] | Freq: Once | INTRAVENOUS | Status: AC
  Administered 2024-05-17: 2200 [IU] via INTRAVENOUS
  Filled 2024-05-17: qty 2200

## 2024-05-17 MED ORDER — SENNA 8.6 MG PO TABS
1.0000 | ORAL_TABLET | Freq: Every day | ORAL | Status: DC
  Administered 2024-05-17 – 2024-05-20 (×4): 8.6 mg via ORAL
  Filled 2024-05-17 (×4): qty 1

## 2024-05-17 MED ORDER — LACTULOSE 10 GM/15ML PO SOLN
20.0000 g | Freq: Once | ORAL | Status: DC
  Filled 2024-05-17: qty 30

## 2024-05-17 MED ORDER — POTASSIUM CHLORIDE 20 MEQ PO PACK
40.0000 meq | PACK | Freq: Once | ORAL | Status: AC
  Administered 2024-05-17: 40 meq via ORAL
  Filled 2024-05-17: qty 2

## 2024-05-17 MED ORDER — HEPARIN BOLUS VIA INFUSION
1100.0000 [IU] | Freq: Once | INTRAVENOUS | Status: AC
  Administered 2024-05-17: 1100 [IU] via INTRAVENOUS
  Filled 2024-05-17: qty 1100

## 2024-05-17 MED ORDER — DM-GUAIFENESIN ER 30-600 MG PO TB12
1.0000 | ORAL_TABLET | Freq: Two times a day (BID) | ORAL | Status: AC
  Administered 2024-05-17 – 2024-05-18 (×3): 1 via ORAL
  Filled 2024-05-17 (×4): qty 1

## 2024-05-17 MED ORDER — POLYETHYLENE GLYCOL 3350 17 G PO PACK
17.0000 g | PACK | Freq: Two times a day (BID) | ORAL | Status: DC
  Administered 2024-05-17 – 2024-05-20 (×6): 17 g via ORAL
  Filled 2024-05-17 (×6): qty 1

## 2024-05-17 MED ORDER — PSEUDOEPHEDRINE HCL 30 MG PO TABS
60.0000 mg | ORAL_TABLET | Freq: Once | ORAL | Status: AC
  Administered 2024-05-17: 60 mg via ORAL
  Filled 2024-05-17: qty 2

## 2024-05-17 NOTE — Progress Notes (Signed)
 PHARMACY - ANTICOAGULATION CONSULT NOTE  Pharmacy Consult for Heparin   Indication: chest pain/ACS  Allergies  Allergen Reactions   Pantoprazole  Nausea And Vomiting   Metformin And Related Diarrhea    Patient Measurements: Height: 5' 5.98" (167.6 cm) Weight: 75.9 kg (167 lb 5.3 oz) IBW/kg (Calculated) : 59.26 HEPARIN  DW (KG): 74.3  Vital Signs: Temp: 98 F (36.7 C) (05/25 1600) Temp Source: Oral (05/25 1600) BP: 110/52 (05/25 1600) Pulse Rate: 73 (05/25 1600)  Labs: Recent Labs    05/15/24 0553 05/16/24 0625 05/16/24 1555 05/17/24 0138 05/17/24 0900 05/17/24 1811  HGB 10.0* 10.0*  --  9.6*  --   --   HCT 33.1* 31.6*  --  29.9*  --   --   PLT 313 311  --  296  --   --   APTT 89* 54*   < > 71* 60* 52*  HEPARINUNFRC >1.10* >1.10*  --  1.05*  --   --   CREATININE 0.90 0.83  --   --  0.75  --   TROPONINIHS  --  67*  --   --   --   --    < > = values in this interval not displayed.    Estimated Creatinine Clearance: 49.6 mL/min (by C-G formula based on SCr of 0.75 mg/dL).   Medical History: Past Medical History:  Diagnosis Date   Acute on chronic congestive heart failure (HCC) 10/07/2020   Arrhythmia    Atrial fibrillation and flutter (HCC) 01/06/2019   Plavix  added to eliquis  10-22 after nstemi       Last Assessment & Plan:    Marked facial hematoma post fall but ct of neck and head reportedly normal. No ha or ataxia now but is using a walker. On eliquis  and plavix      Atrial flutter (HCC) 01/2018   new onset    Basal cell carcinoma of back    Basal cell carcinoma of lip    Cerebral aneurysm    followed by [facility]   CHF (congestive heart failure) (HCC)    DDD (degenerative disc disease), lumbar    superior plate depression, L3 08/18/2014   Diabetes mellitus type II, controlled (HCC)    Diverticulosis    Dysrhythmia    Paroxysmal Supraventricular Tachycardia   Dysthymia    depression   GERD (gastroesophageal reflux disease)    History of falling 04/03/2022    History of meniscal tear    Humeral surgical neck fracture 05/16/2022   Hyperlipidemia    Hypertension    Hypokalemia 12/12/2022   Hypothyroidism    Late onset Alzheimer's disease with behavioral disturbance (HCC)    Leukocytosis 10/07/2020   Mild pulmonary hypertension (HCC)    Moderate asthma without complication 12/12/2022   Multifocal pneumonia 12/09/2022   Overactive bladder    Peripheral vascular disease (HCC)    Puncture wound of forehead 06/11/2023   Seasonal allergic rhinitis    Severe sepsis (HCC) 02/07/2022   Sleep apnea     Medications:  Medications Prior to Admission  Medication Sig Dispense Refill Last Dose/Taking   albuterol  (PROVENTIL ) (2.5 MG/3ML) 0.083% nebulizer solution Take 3 mLs (2.5 mg total) by nebulization every 6 (six) hours as needed for wheezing or shortness of breath. 75 mL 3 Taking As Needed   amiodarone  (PACERONE ) 200 MG tablet Take 1 tablet (200 mg total) by mouth daily. 90 tablet 3 Taking   apixaban  (ELIQUIS ) 5 MG TABS tablet Take 1 tablet (5 mg total) by mouth 2 (  two) times daily. 180 tablet 3 Taking   donepezil  (ARICEPT ) 10 MG tablet TAKE ONE TABLET (10 MG) BY MOUTH AT BEDTIME 90 tablet 0 Taking   gabapentin  (NEURONTIN ) 100 MG capsule Take 1 capsule (100 mg total) by mouth at bedtime.   Taking   levothyroxine  (SYNTHROID ) 88 MCG tablet TAKE 1 TABLET EVERY DAY ON EMPTY STOMACHWITH A GLASS OF WATER AT LEAST 30-60 MINBEFORE BREAKFAST 90 tablet 3 Taking   metoprolol  succinate (TOPROL -XL) 25 MG 24 hr tablet Take 0.5 tablets (12.5 mg total) by mouth daily. Take with or immediately following a meal.   Taking   nitroGLYCERIN  (NITROSTAT ) 0.4 MG SL tablet Place 1 tablet (0.4 mg total) under the tongue every 5 (five) minutes as needed for chest pain. 25 tablet 3 Taking As Needed   potassium chloride  SA (KLOR-CON  M) 20 MEQ tablet Take 1 tablet (20 mEq total) by mouth daily as needed. Take it on the days you take Torsemide  30 tablet 0 Taking As Needed    pravastatin  (PRAVACHOL ) 20 MG tablet Take 1 tablet (20 mg total) by mouth at bedtime. 90 tablet 3 Taking   Semaglutide ,0.25 or 0.5MG /DOS, (OZEMPIC , 0.25 OR 0.5 MG/DOSE,) 2 MG/1.5ML SOPN Inject 0.5 mg into the skin once a week. 6 mL 1 Taking   Tiotropium Bromide Monohydrate  (SPIRIVA  RESPIMAT) 1.25 MCG/ACT AERS Inhale into the lungs.   Taking   torsemide  (DEMADEX ) 20 MG tablet Take 1 tablet (20 mg total) by mouth daily as needed. For leg edema or sob 30 tablet 0 Taking As Needed   venlafaxine  XR (EFFEXOR -XR) 75 MG 24 hr capsule TAKE 1 CAPSULE BY MOUTH ONCE DAILY. 90 capsule 0 Taking    Assessment: Pharmacy consulted to dose heparin  in this 89 year old female admitted with ACS/NSTEMI.  Pt was on Eliquis  5 mg PO BID PTA,  last dose on 5/21 @ 1900. CrCl = 49.3 ml/min   Date/Time aPTT/HL Rate  Comment 5/22 1259 62/-  900 units/hr Slightly subtherapeutic  5/22 2223        74                  1100 units/hr     therapeutic X 1 5/23 0553        89 />1.10       1100 units/hr     therapeutic X 2  5/24 0625 54/>1.10 1050 units/hr Subtherapeutic 5/24 1555 63/ -   1200 units/hr Subtherapeutic 5/25 0138       71/   1.05         1350 units/hr   therapeutic X 1  5/25 0900 aPTT 60 1350 units/hr Subtherapeutic  5/25 1811 aPTT=52 1450 units/hr Subtherapeutic   Goal of Therapy:  Heparin  level 0.3-0.7 units/ml aPTT 66 - 102 seconds Monitor platelets by anticoagulation protocol: Yes   Plan:  Bolus with heparin  2200 units IV x 1 , then  Increase  heparin  infusion rate to 1700 units/hour Check aPTT 8 hours after rate change Continue to monitor aPTT and heparin  levels until correlating Monitor CBC and signs/symptoms of bleeding  Jobie Popp Rodriguez-Guzman PharmD, BCPS 05/17/2024 6:34 PM

## 2024-05-17 NOTE — Progress Notes (Signed)
 PHARMACY - ANTICOAGULATION CONSULT NOTE  Pharmacy Consult for Heparin   Indication: chest pain/ACS  Allergies  Allergen Reactions   Pantoprazole  Nausea And Vomiting   Metformin And Related Diarrhea    Patient Measurements: Height: 5' 5.98" (167.6 cm) Weight: 74.6 kg (164 lb 7.4 oz) IBW/kg (Calculated) : 59.26 HEPARIN  DW (KG): 74.3  Vital Signs: Temp: 98 F (36.7 C) (05/25 0000) Temp Source: Oral (05/25 0000) BP: 119/61 (05/25 0000) Pulse Rate: 71 (05/25 0000)  Labs: Recent Labs    05/14/24 1259 05/14/24 2223 05/15/24 0553 05/16/24 0625 05/16/24 1555 05/17/24 0138  HGB  --    < > 10.0* 10.0*  --  9.6*  HCT  --   --  33.1* 31.6*  --  29.9*  PLT  --   --  313 311  --  296  APTT 62*   < > 89* 54* 63* 71*  HEPARINUNFRC  --   --  >1.10* >1.10*  --  1.05*  CREATININE  --   --  0.90 0.83  --   --   TROPONINIHS 282*  --   --  67*  --   --    < > = values in this interval not displayed.    Estimated Creatinine Clearance: 47.4 mL/min (by C-G formula based on SCr of 0.83 mg/dL).   Medical History: Past Medical History:  Diagnosis Date   Acute on chronic congestive heart failure (HCC) 10/07/2020   Arrhythmia    Atrial fibrillation and flutter (HCC) 01/06/2019   Plavix  added to eliquis  10-22 after nstemi       Last Assessment & Plan:    Marked facial hematoma post fall but ct of neck and head reportedly normal. No ha or ataxia now but is using a walker. On eliquis  and plavix      Atrial flutter (HCC) 01/2018   new onset    Basal cell carcinoma of back    Basal cell carcinoma of lip    Cerebral aneurysm    followed by [facility]   CHF (congestive heart failure) (HCC)    DDD (degenerative disc disease), lumbar    superior plate depression, L3 08/18/2014   Diabetes mellitus type II, controlled (HCC)    Diverticulosis    Dysrhythmia    Paroxysmal Supraventricular Tachycardia   Dysthymia    depression   GERD (gastroesophageal reflux disease)    History of falling  04/03/2022   History of meniscal tear    Humeral surgical neck fracture 05/16/2022   Hyperlipidemia    Hypertension    Hypokalemia 12/12/2022   Hypothyroidism    Late onset Alzheimer's disease with behavioral disturbance (HCC)    Leukocytosis 10/07/2020   Mild pulmonary hypertension (HCC)    Moderate asthma without complication 12/12/2022   Multifocal pneumonia 12/09/2022   Overactive bladder    Peripheral vascular disease (HCC)    Puncture wound of forehead 06/11/2023   Seasonal allergic rhinitis    Severe sepsis (HCC) 02/07/2022   Sleep apnea     Medications:  Medications Prior to Admission  Medication Sig Dispense Refill Last Dose/Taking   albuterol  (PROVENTIL ) (2.5 MG/3ML) 0.083% nebulizer solution Take 3 mLs (2.5 mg total) by nebulization every 6 (six) hours as needed for wheezing or shortness of breath. 75 mL 3 Taking As Needed   amiodarone  (PACERONE ) 200 MG tablet Take 1 tablet (200 mg total) by mouth daily. 90 tablet 3 Taking   apixaban  (ELIQUIS ) 5 MG TABS tablet Take 1 tablet (5 mg total) by mouth  2 (two) times daily. 180 tablet 3 Taking   donepezil  (ARICEPT ) 10 MG tablet TAKE ONE TABLET (10 MG) BY MOUTH AT BEDTIME 90 tablet 0 Taking   gabapentin  (NEURONTIN ) 100 MG capsule Take 1 capsule (100 mg total) by mouth at bedtime.   Taking   levothyroxine  (SYNTHROID ) 88 MCG tablet TAKE 1 TABLET EVERY DAY ON EMPTY STOMACHWITH A GLASS OF WATER AT LEAST 30-60 MINBEFORE BREAKFAST 90 tablet 3 Taking   metoprolol  succinate (TOPROL -XL) 25 MG 24 hr tablet Take 0.5 tablets (12.5 mg total) by mouth daily. Take with or immediately following a meal.   Taking   nitroGLYCERIN  (NITROSTAT ) 0.4 MG SL tablet Place 1 tablet (0.4 mg total) under the tongue every 5 (five) minutes as needed for chest pain. 25 tablet 3 Taking As Needed   potassium chloride  SA (KLOR-CON  M) 20 MEQ tablet Take 1 tablet (20 mEq total) by mouth daily as needed. Take it on the days you take Torsemide  30 tablet 0 Taking As Needed    pravastatin  (PRAVACHOL ) 20 MG tablet Take 1 tablet (20 mg total) by mouth at bedtime. 90 tablet 3 Taking   Semaglutide ,0.25 or 0.5MG /DOS, (OZEMPIC , 0.25 OR 0.5 MG/DOSE,) 2 MG/1.5ML SOPN Inject 0.5 mg into the skin once a week. 6 mL 1 Taking   Tiotropium Bromide Monohydrate  (SPIRIVA  RESPIMAT) 1.25 MCG/ACT AERS Inhale into the lungs.   Taking   torsemide  (DEMADEX ) 20 MG tablet Take 1 tablet (20 mg total) by mouth daily as needed. For leg edema or sob 30 tablet 0 Taking As Needed   venlafaxine  XR (EFFEXOR -XR) 75 MG 24 hr capsule TAKE 1 CAPSULE BY MOUTH ONCE DAILY. 90 capsule 0 Taking    Assessment: Pharmacy consulted to dose heparin  in this 89 year old female admitted with ACS/NSTEMI.  Pt was on Eliquis  5 mg PO BID PTA,  last dose on 5/21 @ 1900. CrCl = 49.3 ml/min   Date/Time aPTT/HL Rate  Comment 5/22 1259 62/-  900 units/hr Slightly subtherapeutic  5/22 2223        74                  1100 units/hr     therapeutic X 1 5/23 0553        89 />1.10       1100 units/hr     therapeutic X 2  5/24 0625 54/>1.10 1050 units/hr Subtherapeutic 5/24 1555 63/ -   1200 units/hr Subtherapeutic 5/25 0138       71/   1.05         1350 units/hr   therapeutic X 1   Goal of Therapy:  Heparin  level 0.3-0.7 units/ml aPTT 66 - 102 seconds Monitor platelets by anticoagulation protocol: Yes   Plan:  5/25 @ 0138:  aPTT = 71,   HL = 1.05 - aPTT therapeutic X 1,   HL elevated from Eliquis  PTA - will continue pt on current rate and recheck aPTT in 8 hrs - will recheck HL on 5/26 with AM labs. - CBC daily  Continue to monitor aPTT and heparin  levels until correlating Monitor CBC and signs/symptoms of bleeding  Thank you for involving pharmacy in this patient's care.   Hser Belanger D Clinical Pharmacist 05/17/2024 2:14 AM

## 2024-05-17 NOTE — Evaluation (Addendum)
 Occupational Therapy Evaluation Patient Details Name: Alison Richardson MRN: [id number] DOB: 03-31-1933 Today's Date: 05/17/2024   History of Present Illness   Pt is an 89 y/o female admitted secondary to SOB and found to have elevated troponin. Cardiology was consulted and pt found to have a NSTEMI with planned cardiac cath on 5/27. PMH including but not limited to significant dementia, CAD medically managed, persistent A-fib, HFimpEF, valvular heart disease, DM2, HTN, HLD, and hypothyroidism.     Clinical Impressions Prior to hospital admission, pt was mod independent with ADLs and mobility with 4WW. Pt lives at [facility] in an apartment. Pt currently requires CGA for functional transfers and a few steps in room with +1 HHA (+2 for line/lead mgmt).  Anticipate pt will required up to MIN A for LB ADLs depending on fatigue levels. Further activity deferred due to arrival aof breakfast and pt requesting to eat. Pt would benefit from skilled OT services to address noted impairments and functional limitations (see below for any additional details) in order to maximize safety and independence while minimizing falls risk and caregiver burden. Anticipate the need for follow up [facility] OT services upon acute hospital DC.      If plan is discharge home, recommend the following:   A little help with walking and/or transfers;A little help with bathing/dressing/bathroom;Assistance with cooking/housework;Supervision due to cognitive status;Direct supervision/assist for financial management;Direct supervision/assist for medications management     Functional Status Assessment   Patient has had a recent decline in their functional status and demonstrates the ability to make significant improvements in function in a reasonable and predictable amount of time.     Equipment Recommendations   None recommended by OT      Precautions/Restrictions   Precautions Precautions: Fall Recall of  Precautions/Restrictions: Intact Restrictions Weight Bearing Restrictions Per Provider Order: No     Mobility Bed Mobility Overal bed mobility: Modified Independent             General bed mobility comments: increased time    Transfers Overall transfer level: Needs assistance Equipment used: 1 person hand held assist Transfers: Sit to/from Stand Sit to Stand: Contact guard assist, +2 safety/equipment                  Balance Overall balance assessment: Needs assistance Sitting-balance support: Feet supported Sitting balance-Leahy Scale: Good     Standing balance support: During functional activity Standing balance-Leahy Scale: Fair                             ADL either performed or assessed with clinical judgement   ADL Overall ADL's : Needs assistance/impaired Eating/Feeding: Sitting;Set up   Grooming: Set up;Sitting   Upper Body Bathing: Sitting;Set up   Lower Body Bathing: Sit to/from stand;Contact guard assist       Lower Body Dressing: Contact guard assist   Toilet Transfer: Contact guard assist;+2 for safety/equipment;Ambulation   Toileting- Clothing Manipulation and Hygiene: Minimal assistance       Functional mobility during ADLs: +2 for safety/equipment;Contact guard assist        Pertinent Vitals/Pain Pain Assessment Pain Assessment: No/denies pain     Extremity/Trunk Assessment Upper Extremity Assessment Upper Extremity Assessment: Generalized weakness;Right hand dominant   Lower Extremity Assessment Lower Extremity Assessment: Defer to PT evaluation       Communication Communication Communication: No apparent difficulties   Cognition Arousal: Alert Behavior During Therapy: WFL for tasks assessed/performed Cognition: History  of cognitive impairments                               Following commands: Intact       Cueing  General Comments   Cueing Techniques: Verbal cues   On 4L O2, VSS  throughout            Home Living Family/patient expects to be discharged to:: Private residence Living Arrangements: Alone Available Help at Discharge: Personal care attendant;Other (Comment) (PCA 4hrs 2x week) Type of Home: Independent living facility Home Access: Elevator     Home Layout: One level     Bathroom Shower/Tub: Producer, television/film/video: Handicapped height Bathroom Accessibility: Yes   Home Equipment: Grab bars - tub/shower;Grab bars - toilet;Cane - single point;Shower seat;Rollator (4 wheels)   Additional Comments: [facility] living section in one of their apartments      Prior Functioning/Environment Prior Level of Function : Independent/Modified Independent             Mobility Comments: ambulates with use of a rollator ADLs Comments: Patient reports she is I to mod I for ADLs    OT Problem List: Decreased strength;Decreased range of motion;Decreased activity tolerance;Impaired balance (sitting and/or standing);Decreased cognition;Cardiopulmonary status limiting activity;Decreased knowledge of precautions   OT Treatment/Interventions: Self-care/ADL training;Therapeutic exercise;Energy conservation;DME and/or AE instruction;Therapeutic activities;Patient/family education;Balance training;Cognitive remediation/compensation      OT Goals(Current goals can be found in the care plan section)   Acute Rehab OT Goals OT Goal Formulation: With patient Time For Goal Achievement: 05/31/24 Potential to Achieve Goals: Fair   OT Frequency:  Min 2X/week    Co-evaluation PT/OT/SLP Co-Evaluation/Treatment: Yes Reason for Co-Treatment: To address functional/ADL transfers;For patient/therapist safety PT goals addressed during session: Mobility/safety with mobility;Balance;Proper use of DME;Strengthening/ROM OT goals addressed during session: ADL's and self-care      AM-PAC OT "6 Clicks" Daily Activity     Outcome Measure  Help from another person eating meals?: None Help from another person taking care of personal grooming?: None Help from another person toileting, which includes using toliet, bedpan, or urinal?: A Little Help from another person bathing (including washing, rinsing, drying)?: A Little Help from another person to put on and taking off regular upper body clothing?: None Help from another person to put on and taking off regular lower body clothing?: A Little 6 Click Score: 21   End of Session Equipment Utilized During Treatment: Oxygen Nurse Communication: Mobility status  Activity Tolerance: Patient tolerated treatment well Patient left: in chair;with call bell/phone within reach;with chair alarm set;with family/visitor present  OT Visit Diagnosis: Muscle weakness (generalized) (M62.81);Repeated falls (R29.6);History of falling (Z91.81);Unsteadiness on feet (R26.81)                Time: 4098-1191 OT Time Calculation (min): 29 min Charges:  OT General Charges $OT Visit: 1 Visit OT Evaluation $OT Eval Moderate Complexity: 1 Mod  Tzion Wedel L. Chalyn Amescua, OTR/L  05/17/24, 1:06 PM

## 2024-05-17 NOTE — Progress Notes (Signed)
 Palliative Care Progress Note, Assessment & Plan   Patient Name: Alison Richardson       Date: 05/17/2024 DOB: Aug 09, 1933  Age: 89 y.o. MRN#: [id number] Attending Physician: Ree Candy, MD Primary Care Physician: Hadassah Letters, MD Admit Date: 05/14/2024  Subjective: Denies pain. Reports her L shoulder pain never returned after 2 doses of morphine . Slept well. Denies CP/SOB. Ate breakfast. No BM since admit. Ray, son at bedside, reports RN is bringing Miralax  for his mother. Patient and son confirm that cardiac cath is scheduled 5/27.   HPI: "Alison Richardson is a 89 y.o. female with medical history significant for dementia, HTN, DM, CHF, atrial fibrillation on Eliquis  and amiodarone , PAD, recently hospitalized from 5/16 to 5/17 with hypotension that improved with medication adjustment, who presented to the ED brought in by EMS from her facility with shortness of breath for which she received DuoNebs and Solu-Medrol  en route.  She has no history of COPD.  Daughter at bedside contributes to history.  Patient has had no chest pain, lower extremity pain or swelling and no cough or fever. ED course and data review: Tachypneic to the low 30s with elevated blood pressure of 153/105, requiring hypoxic to 87% on 4 L on which she arrived, subsequently transitioned to HFNC and then BiPAP".    Summary of counseling/coordination of care: Extensive chart review completed prior to meeting patient including labs, vital signs, imaging, progress notes, orders, and available advanced directive documents from current and previous encounters.   After reviewing the patient's chart and assessing the patient at bedside, I spoke with patient in regards to symptom management and goals of care.   Elderly, Ill-appearing female  sitting upright in recliner. She is alert and oriented, calm and pleasant. Even, unlabored respirations. She is in no distress. O2 reduced to 4L today from 9L yesterday.   Patient and Ray inquire about the role of palliative medicine. Educated that palliative medicine team serves as a support system and provides care for patients and families going through serious  illnesses to end of life care. Both are appreciative of the care patient has received during her admit. Neither have questions or concerns.   Both patient and son were encouraged to contact PMT with questions/concerns.   Therapeutic silence and active listening provided for patient and son to share their thoughts and emotions regarding current medical situation.  Emotional support provided.  Physical Exam Vitals reviewed.  Constitutional:      General: She is not in acute distress.    Appearance: She is ill-appearing.  HENT:     Head: Normocephalic and atraumatic.     Nose:     Comments: O2 via [place]    Mouth/Throat:     Mouth: Mucous membranes are moist.  Pulmonary:     Effort: Pulmonary effort is normal. No respiratory distress.  Abdominal:     General: There is no distension.     Palpations: Abdomen is soft.     Tenderness: There is no abdominal tenderness. There is no guarding.  Musculoskeletal:     Right lower leg: No edema.     Left lower leg: No edema.  Skin:    General: Skin is warm  and dry.  Neurological:     Mental Status: She is alert and oriented to person, place, and time.  Psychiatric:        Mood and Affect: Mood normal.        Behavior: Behavior normal.    Recommendations/Plan: Continue DNR/DNI status as previously documented    Continue current supportive interventions PMT to continue to follow for support          Total Time 50 minutes   Time spent includes: Detailed review of medical records (labs, imaging, vital signs), medically appropriate exam (mental status, respiratory, cardiac, skin),  discussed with treatment team, counseling and educating patient, family and staff, documenting clinical information, medication management and coordination of care.     Alison Richardson, Alison Richardson- [facility] Palliative Medicine Team  05/17/2024 8:18 AM  Office [phone]  Pager [phone]

## 2024-05-17 NOTE — Progress Notes (Signed)
 PHARMACY - ANTICOAGULATION CONSULT NOTE  Pharmacy Consult for Heparin   Indication: chest pain/ACS  Allergies  Allergen Reactions   Pantoprazole  Nausea And Vomiting   Metformin And Related Diarrhea    Patient Measurements: Height: 5' 5.98" (167.6 cm) Weight: 75.9 kg (167 lb 5.3 oz) IBW/kg (Calculated) : 59.26 HEPARIN  DW (KG): 74.3  Vital Signs: Temp: 97.8 F (36.6 C) (05/25 0800) Temp Source: Oral (05/25 0800) BP: 133/81 (05/25 0800) Pulse Rate: 83 (05/25 0800)  Labs: Recent Labs    05/14/24 1259 05/14/24 2223 05/15/24 0553 05/16/24 0625 05/16/24 1555 05/17/24 0138 05/17/24 0900  HGB  --    < > 10.0* 10.0*  --  9.6*  --   HCT  --   --  33.1* 31.6*  --  29.9*  --   PLT  --   --  313 311  --  296  --   APTT 62*   < > 89* 54* 63* 71* 60*  HEPARINUNFRC  --   --  >1.10* >1.10*  --  1.05*  --   CREATININE  --   --  0.90 0.83  --   --   --   TROPONINIHS 282*  --   --  67*  --   --   --    < > = values in this interval not displayed.    Estimated Creatinine Clearance: 47.8 mL/min (by C-G formula based on SCr of 0.83 mg/dL).   Medical History: Past Medical History:  Diagnosis Date   Acute on chronic congestive heart failure (HCC) 10/07/2020   Arrhythmia    Atrial fibrillation and flutter (HCC) 01/06/2019   Plavix  added to eliquis  10-22 after nstemi       Last Assessment & Plan:    Marked facial hematoma post fall but ct of neck and head reportedly normal. No ha or ataxia now but is using a walker. On eliquis  and plavix      Atrial flutter (HCC) 01/2018   new onset    Basal cell carcinoma of back    Basal cell carcinoma of lip    Cerebral aneurysm    followed by [facility]   CHF (congestive heart failure) (HCC)    DDD (degenerative disc disease), lumbar    superior plate depression, L3 08/18/2014   Diabetes mellitus type II, controlled (HCC)    Diverticulosis    Dysrhythmia    Paroxysmal Supraventricular Tachycardia   Dysthymia    depression   GERD  (gastroesophageal reflux disease)    History of falling 04/03/2022   History of meniscal tear    Humeral surgical neck fracture 05/16/2022   Hyperlipidemia    Hypertension    Hypokalemia 12/12/2022   Hypothyroidism    Late onset Alzheimer's disease with behavioral disturbance (HCC)    Leukocytosis 10/07/2020   Mild pulmonary hypertension (HCC)    Moderate asthma without complication 12/12/2022   Multifocal pneumonia 12/09/2022   Overactive bladder    Peripheral vascular disease (HCC)    Puncture wound of forehead 06/11/2023   Seasonal allergic rhinitis    Severe sepsis (HCC) 02/07/2022   Sleep apnea     Medications:  Medications Prior to Admission  Medication Sig Dispense Refill Last Dose/Taking   albuterol  (PROVENTIL ) (2.5 MG/3ML) 0.083% nebulizer solution Take 3 mLs (2.5 mg total) by nebulization every 6 (six) hours as needed for wheezing or shortness of breath. 75 mL 3 Taking As Needed   amiodarone  (PACERONE ) 200 MG tablet Take 1 tablet (200 mg total) by mouth  daily. 90 tablet 3 Taking   apixaban  (ELIQUIS ) 5 MG TABS tablet Take 1 tablet (5 mg total) by mouth 2 (two) times daily. 180 tablet 3 Taking   donepezil  (ARICEPT ) 10 MG tablet TAKE ONE TABLET (10 MG) BY MOUTH AT BEDTIME 90 tablet 0 Taking   gabapentin  (NEURONTIN ) 100 MG capsule Take 1 capsule (100 mg total) by mouth at bedtime.   Taking   levothyroxine  (SYNTHROID ) 88 MCG tablet TAKE 1 TABLET EVERY DAY ON EMPTY STOMACHWITH A GLASS OF WATER AT LEAST 30-60 MINBEFORE BREAKFAST 90 tablet 3 Taking   metoprolol  succinate (TOPROL -XL) 25 MG 24 hr tablet Take 0.5 tablets (12.5 mg total) by mouth daily. Take with or immediately following a meal.   Taking   nitroGLYCERIN  (NITROSTAT ) 0.4 MG SL tablet Place 1 tablet (0.4 mg total) under the tongue every 5 (five) minutes as needed for chest pain. 25 tablet 3 Taking As Needed   potassium chloride  SA (KLOR-CON  M) 20 MEQ tablet Take 1 tablet (20 mEq total) by mouth daily as needed. Take it on  the days you take Torsemide  30 tablet 0 Taking As Needed   pravastatin  (PRAVACHOL ) 20 MG tablet Take 1 tablet (20 mg total) by mouth at bedtime. 90 tablet 3 Taking   Semaglutide ,0.25 or 0.5MG /DOS, (OZEMPIC , 0.25 OR 0.5 MG/DOSE,) 2 MG/1.5ML SOPN Inject 0.5 mg into the skin once a week. 6 mL 1 Taking   Tiotropium Bromide Monohydrate  (SPIRIVA  RESPIMAT) 1.25 MCG/ACT AERS Inhale into the lungs.   Taking   torsemide  (DEMADEX ) 20 MG tablet Take 1 tablet (20 mg total) by mouth daily as needed. For leg edema or sob 30 tablet 0 Taking As Needed   venlafaxine  XR (EFFEXOR -XR) 75 MG 24 hr capsule TAKE 1 CAPSULE BY MOUTH ONCE DAILY. 90 capsule 0 Taking    Assessment: Pharmacy consulted to dose heparin  in this 89 year old female admitted with ACS/NSTEMI.  Pt was on Eliquis  5 mg PO BID PTA,  last dose on 5/21 @ 1900. CrCl = 49.3 ml/min   Date/Time aPTT/HL Rate  Comment 5/22 1259 62/-  900 units/hr Slightly subtherapeutic  5/22 2223        74                  1100 units/hr     therapeutic X 1 5/23 0553        89 />1.10       1100 units/hr     therapeutic X 2  5/24 0625 54/>1.10 1050 units/hr Subtherapeutic 5/24 1555 63/ -   1200 units/hr Subtherapeutic 5/25 0138       71/   1.05         1350 units/hr   therapeutic X 1  5/25 0900 aPTT 60 1350 units/hr Subtherapeutic    Goal of Therapy:  Heparin  level 0.3-0.7 units/ml aPTT 66 - 102 seconds Monitor platelets by anticoagulation protocol: Yes   Plan:  5/25 0900 aPTT 60 Subtherapeutic  -will order bolus of 1100 units x 1 - will increase heparin  from 1350 to 1450 units/hr and recheck aPTT in 8 hrs - will recheck HL on 5/26 with AM labs. - CBC daily  Continue to monitor aPTT and heparin  levels until correlating Monitor CBC and signs/symptoms of bleeding  Thank you for involving pharmacy in this patient's care.   Aloura Matsuoka A Clinical Pharmacist 05/17/2024 10:20 AM

## 2024-05-17 NOTE — Progress Notes (Signed)
 PROGRESS NOTE Alison Richardson    DOB: 1933/07/15, 89 y.o.  WUJ:[id number]    Code Status: Limited: Do not attempt resuscitation (DNR) -DNR-LIMITED -Do Not Intubate/DNI    DOA: 05/14/2024   LOS: 3   Brief hospital course  Alison Richardson is a 89 y.o. female with medical history significant for dementia, HTN, DM, CHF, atrial fibrillation on Eliquis  and amiodarone , PAD, recently hospitalized from 5/16 to 5/17 with hypotension that improved with medication adjustment, who presented to the ED with shortness of breath for which she received DuoNebs and Solu-Medrol  en route.    ED course: Tachypneic to the low 30s with elevated blood pressure of 153/105, hypoxic to 87% on 4 L when she arrived, subsequently transitioned to HFNC and then BiPAP Labs notable for: VBG on BiPAP FiO2 60% with pH 7.36 and PCO244. WBC of 24,000 with lactic acid 2.8 and negative respiratory viral panel. Troponin 14--108 with BNP 290. Lipase and LFTs unremarkable  EKG: sinus at 70 with no acute ST-T wave changes Chest x-ray mild to moderate cardiogenic failure  Remains chest pain free and on heparin  gtt while awaiting ischemic evaluation/cath tentatively planned for 5/27. Cardiology consulted.  Respiratory status remains precarious on high O2 requirement but she is gradually improving. Being treated with steroids, antibiotics, breathing treatments. Pulmonology consulted.   Encouraging OOB starting today if tolerated.   Assessment & Plan  Principal Problem:   Acute on chronic diastolic CHF (congestive heart failure) (HCC) Active Problems:   NSTEMI (non-ST elevated myocardial infarction) (HCC)   CAD (coronary artery disease)   Acute respiratory failure with hypoxia (HCC)   Hypothyroidism   Leukocytosis   Paroxysmal atrial fibrillation (HCC)   Depression with anxiety   Cognitive impairment, mild, so stated   PAD (peripheral artery disease) (HCC)   DM type 2 (diabetes mellitus, type 2) (HCC)   Lactic acidosis  Acute on  chronic diastolic CHF- Signs of pulmonary edema on imaging. Had echo January 2025 with EF 60 to 65%. Repeat echo this admission showed EF 40-45%, global hypokinesis, moderately enlarged RV, moderately elevated pulmonary artery pressure. Mild-mod TV regurg. - Continue IV Lasix  - strict I/O - weaned off nitroglycerin  gtt - Hypotension precludes from further GDMT at this time.  - cardiology following Daily weights with intake and output monitoring - OOB and ambulate as tolerated   NSTEMI  CAD- Troponin 14>108, without acute EKG changes or complaints of chest pain On heparin  infusion so will hold Eliquis  Continue pravastatin .  Continue metoprolol  as BP would tolerate - tentatively planning heart cath 5/27 once respiratory status has stabilized.    Acute respiratory failure with hypoxia- no baseline O2 requirement. Weaned from bipap. Echo shows significant increase in pulmonary pressures but may have been completed while on bipap so would interfere with results. Pulmonology consulted to review.  - breathing treatments - steroids  - diuresis as above - palliative consulted - consulting pulmonology, Dr. Meredeth Stallion  Constipation- bowel regimen increased   Paroxysmal atrial fibrillation (HCC) On heparin  infusion so we will hold apixaban  - discontinued amiodarone  and metoprolol  in setting of low BP/HR    Hypothyroidism Continue levothyroxine    Depression with anxiety Continue venlafaxine    DM type 2 (diabetes mellitus, type 2) (HCC) Sliding scale insulin  coverage- added semglee  as CBGs increased on steroid use   PAD (peripheral artery disease) (HCC) Continue pravastatin  Currently on aspirin    Cognitive impairment, mild, so stated Delirium precautions  Body mass index is 27.02 kg/m.  VTE ppx: heparin   Diet:  Diet   Diet regular Room service appropriate? Yes; Fluid consistency: Thin   Consultants: Cardiology  Pulmonology   Subjective 05/17/24    Pt reports that she is  overall doing well. Did well with getting to her chair yesterday and had no dyspnea.    Objective  Blood pressure (!) 148/89, pulse 65, temperature 97.9 F (36.6 C), temperature source Oral, resp. rate (!) 22, SpO2 96%.  Intake/Output Summary (Last 24 hours) at 05/17/2024 0803 Last data filed at 05/17/2024 0534 Gross per 24 hour  Intake 516.55 ml  Output 2050 ml  Net -1533.45 ml   Filed Weights   05/15/24 0500 05/16/24 0356 05/17/24 0500  Weight: 73.4 kg 74.6 kg 75.9 kg   Physical Exam Vitals and nursing note reviewed.  Constitutional:      General: She is not in acute distress.    Appearance: She is not ill-appearing.  HENT:     Head: Normocephalic.     Mouth/Throat:     Mouth: Mucous membranes are moist.  Cardiovascular:     Rate and Rhythm: Normal rate and regular rhythm.  Pulmonary:     Effort: Pulmonary effort is normal. No tachypnea, accessory muscle usage or respiratory distress.     Breath sounds: Decreased air movement present. Rhonchi present. No decreased breath sounds.  Musculoskeletal:     Right lower leg: No edema.     Left lower leg: No edema.  Skin:    General: Skin is warm and dry.  Neurological:     General: No focal deficit present.     Mental Status: She is alert.  Psychiatric:        Mood and Affect: Mood normal.    Labs   I have personally reviewed the following labs and imaging studies CBC    Component Value Date/Time   WBC 17.8 (H) 05/17/2024 0138   RBC 3.62 (L) 05/17/2024 0138   HGB 9.6 (L) 05/17/2024 0138   HGB 11.2 04/13/2024 1357   HCT 29.9 (L) 05/17/2024 0138   HCT 35.7 04/13/2024 1357   PLT 296 05/17/2024 0138   PLT 419 04/13/2024 1357   MCV 82.6 05/17/2024 0138   MCV 85 04/13/2024 1357   MCH 26.5 05/17/2024 0138   MCHC 32.1 05/17/2024 0138   RDW 15.5 05/17/2024 0138   RDW 14.6 04/13/2024 1357   LYMPHSABS 4.5 (H) 05/14/2024 0034   LYMPHSABS 2.5 04/13/2024 1357   MONOABS 1.4 (H) 05/14/2024 0034   EOSABS 0.3 05/14/2024 0034    EOSABS 0.2 04/13/2024 1357   BASOSABS 0.1 05/14/2024 0034   BASOSABS 0.1 04/13/2024 1357      Latest Ref Rng & Units 05/16/2024    6:25 AM 05/15/2024    5:53 AM 05/14/2024   12:34 AM  BMP  Glucose 70 - 99 mg/dL 865  784  696   BUN 8 - 23 mg/dL 22  28  13    Creatinine 0.44 - 1.00 mg/dL 2.95  2.84  1.32   Sodium 135 - 145 mmol/L 141  136  139   Potassium 3.5 - 5.1 mmol/L 4.0  3.7  3.5   Chloride 98 - 111 mmol/L 105  104  107   CO2 22 - 32 mmol/L 25  22  19    Calcium  8.9 - 10.3 mg/dL 8.6  7.8  8.9     CT CHEST WO CONTRAST Result Date: 05/15/2024 CLINICAL DATA:  Dyspnea EXAM: CT CHEST WITHOUT CONTRAST TECHNIQUE: Multidetector CT imaging of the chest was performed following the  standard protocol without IV contrast. RADIATION DOSE REDUCTION: This exam was performed according to the departmental dose-optimization program which includes automated exposure control, adjustment of the mA and/or kV according to patient size and/or use of iterative reconstruction technique. COMPARISON:  Chest x-ray 05/14/2024, CT chest 12/10/2022 FINDINGS: Cardiovascular: Limited evaluation without intravenous contrast. Moderate aortic atherosclerosis. No aneurysm. Cardiomegaly. Coronary vascular calcification. No pericardial effusion Mediastinum/Nodes: Patent trachea. No thyroid  mass. Multiple mildly enlarged lymph nodes. Right paratracheal node up to 10 mm. Precarinal node measuring up to 14 mm. Esophagus within normal limits. Small hiatal hernia Lungs/Pleura: Small moderate bilateral pleural effusions. Diffuse septal thickening. Multifocal bilateral ground-glass densities. Partial atelectasis in the lower lobes. No pneumothorax Upper Abdomen: No acute finding. Musculoskeletal: No acute osseous abnormality. Multilevel degenerative changes. IMPRESSION: 1. Cardiomegaly with small to moderate bilateral pleural effusions and diffuse septal thickening. Multifocal bilateral ground-glass densities, findings are favored to represent  pulmonary edema. Superimposed infection not excluded. 2. Mild mediastinal adenopathy, likely reactive. 3. Aortic atherosclerosis. Aortic Atherosclerosis (ICD10-I70.0). Electronically Signed   By: Esmeralda Hedge M.D.   On: 05/15/2024 23:30    Disposition Plan & Communication  Patient status: Inpatient  Admitted From: Home Planned disposition location: TBD Anticipated discharge date: TBD pending clinical course  Family Communication: son at bedside    Author: Ree Candy, DO [facility] 05/17/2024, 8:03 AM   Available by Epic secure chat 7AM-7PM. If 7PM-7AM, please contact night-coverage.  [facility] contact information found on [email or web address].

## 2024-05-17 NOTE — Progress Notes (Signed)
 Physical Therapy Evaluation Patient Details Name: Alison Richardson MRN: [id number] DOB: 11/29/1933 Today's Date: 05/17/2024  History of Present Illness  Pt is an 89 y/o female admitted secondary to SOB and found to have elevated troponin. Cardiology was consulted and pt found to have a NSTEMI with planned cardiac cath on 5/27. PMH including but not limited to significant dementia, CAD medically managed, persistent A-fib, HFimpEF, valvular heart disease, DM2, HTN, HLD, and hypothyroidism.   Clinical Impression  Pt presented supine in bed with HOB elevated, awake and willing to participate in therapy session. Pt's son was present at beginning of session but stepped out during mobility. Prior to admission, pt reported that she ambulated with use of a rollator and was independent with ADLs. Pt lives at [facility] in the Independent Living section. At the time of evaluation, pt able to complete bed mobility at a mod I level, transfers with CGA and ambulated a very short distance within her room with min A for stability and line management. Pt on 2L of supplemental O2 via [place] with all VSS throughout. Pt's breakfast tray arrived during the session, and pt ending the session seated in the recliner chair prepared to eat. PT will plan to re-assess pt's functional mobility following her cardiac procedure and update recommendations if needed. Pt would continue to benefit from skilled physical therapy services at this time while admitted and after d/c to address the below listed limitations in order to improve overall safety and independence with functional mobility.       If plan is discharge home, recommend the following: A little help with walking and/or transfers;A little help with bathing/dressing/bathroom;Assist for transportation   Can travel by private vehicle        Equipment Recommendations None recommended by PT  Recommendations for Other Services       Functional Status Assessment Patient has had a  recent decline in their functional status and demonstrates the ability to make significant improvements in function in a reasonable and predictable amount of time.     Precautions / Restrictions Precautions Precautions: Fall Restrictions Weight Bearing Restrictions Per Provider Order: No      Mobility  Bed Mobility Overal bed mobility: Modified Independent             General bed mobility comments: increased time    Transfers Overall transfer level: Needs assistance Equipment used: 1 person hand held assist Transfers: Sit to/from Stand Sit to Stand: Contact guard assist           General transfer comment: pt able to complete sit<>stand transfers with VC'ing and CGA for safety    Ambulation/Gait Ambulation/Gait assistance: Min assist Gait Distance (Feet): 2 Feet Assistive device: 2 person hand held assist, 1 person hand held assist Gait Pattern/deviations: Decreased stride length Gait velocity: decreased     General Gait Details: pt able to take several steps forwards and backwards, turning to then sit down in recliner chair. Light Min A for stability and line management  Stairs            Wheelchair Mobility     Tilt Bed    Modified Rankin (Stroke Patients Only)       Balance Overall balance assessment: Needs assistance Sitting-balance support: Feet supported Sitting balance-Leahy Scale: Good     Standing balance support: During functional activity Standing balance-Leahy Scale: Fair  Pertinent Vitals/Pain Pain Assessment Pain Assessment: No/denies pain    Home Living Family/patient expects to be discharged to:: Private residence Living Arrangements: Alone Available Help at Discharge: Personal care attendant;Other (Comment) (pt has an aide for 4 hrs 2x/week to assist with housework) Type of Home: Independent living facility Home Access: Elevator       Home Layout: One level Home Equipment:  Grab bars - tub/shower;Grab bars - toilet;Cane - single point;Shower seat;Rollator (4 wheels) Additional Comments: [facility] living section in one of their apartments    Prior Function Prior Level of Function : Independent/Modified Independent             Mobility Comments: ambulates with use of a rollator ADLs Comments: Patient reports she is I to mod I for ADLs     Extremity/Trunk Assessment   Upper Extremity Assessment Upper Extremity Assessment: Defer to OT evaluation    Lower Extremity Assessment Lower Extremity Assessment: Generalized weakness       Communication   Communication Communication: No apparent difficulties    Cognition Arousal: Alert Behavior During Therapy: WFL for tasks assessed/performed   PT - Cognitive impairments: History of cognitive impairments (pt with dementia at baseline, but A&O x4 today, no cognitive concerns with general conversation)                         Following commands: Intact       Cueing Cueing Techniques: Verbal cues     General Comments      Exercises     Assessment/Plan    PT Assessment Patient needs continued PT services  PT Problem List Decreased balance;Decreased mobility;Decreased coordination;Cardiopulmonary status limiting activity       PT Treatment Interventions DME instruction;Gait training;Stair training;Functional mobility training;Therapeutic activities;Therapeutic exercise;Balance training;Neuromuscular re-education;Patient/family education    PT Goals (Current goals can be found in the Care Plan section)  Acute Rehab PT Goals Patient Stated Goal: return home and continue with HHPT/HHOT PT Goal Formulation: With patient/family Time For Goal Achievement: 05/31/24 Potential to Achieve Goals: Good    Frequency Min 2X/week     Co-evaluation PT/OT/SLP Co-Evaluation/Treatment: Yes Reason for Co-Treatment: To address functional/ADL transfers;For  patient/therapist safety PT goals addressed during session: Mobility/safety with mobility;Balance;Proper use of DME;Strengthening/ROM         AM-PAC PT "6 Clicks" Mobility  Outcome Measure Help needed turning from your back to your side while in a flat bed without using bedrails?: None Help needed moving from lying on your back to sitting on the side of a flat bed without using bedrails?: None Help needed moving to and from a bed to a chair (including a wheelchair)?: A Little Help needed standing up from a chair using your arms (e.g., wheelchair or bedside chair)?: None Help needed to walk in hospital room?: A Little Help needed climbing 3-5 steps with a railing? : A Little 6 Click Score: 21    End of Session Equipment Utilized During Treatment: Oxygen Activity Tolerance: Patient tolerated treatment well Patient left: in chair;with call bell/phone within reach;with chair alarm set Nurse Communication: Mobility status PT Visit Diagnosis: Other abnormalities of gait and mobility (R26.89)    Time: 7846-9629 PT Time Calculation (min) (ACUTE ONLY): 25 min   Charges:   PT Evaluation $PT Eval Moderate Complexity: 1 Mod   PT General Charges $$ ACUTE PT VISIT: 1 Visit         Cleveland Dales, PT, DPT  Acute Rehabilitation Services Office  [phone]   Leory Rands Bertie Mcconathy 05/17/2024, 10:13 AM

## 2024-05-17 NOTE — Progress Notes (Signed)
 Rounding Note    Patient Name: Alison Richardson Date of Encounter: 05/17/2024  Cardiologist:  Belva Boyden, MD     Subjective   No new concerns today. Shoulder pain resolved. Different family present today, discussed R/LHC in detail, potential outcomes and management.  Inpatient Medications    Scheduled Meds:  aspirin  EC  81 mg Oral Daily   budesonide  (PULMICORT ) nebulizer solution  0.5 mg Nebulization BID   dextromethorphan -guaiFENesin   1 tablet Oral BID   donepezil   10 mg Oral QHS   feeding supplement  1 Container Oral TID BM   furosemide   40 mg Intravenous BID   insulin  aspart  0-9 Units Subcutaneous TID WC   insulin  glargine-yfgn  10 Units Subcutaneous Daily   ipratropium-albuterol   3 mL Nebulization TID   lactulose  20 g Oral Once   levothyroxine   88 mcg Oral Q0600   multivitamin with minerals  1 tablet Oral Daily   polyethylene glycol  17 g Oral BID   potassium chloride   40 mEq Oral Once   pravastatin   20 mg Oral QHS   predniSONE   20 mg Oral Q breakfast   senna  1 tablet Oral Daily   venlafaxine  XR  75 mg Oral Daily   Continuous Infusions:  heparin  1,450 Units/hr (05/17/24 1031)   PRN Meds: acetaminophen , albuterol , guaiFENesin -dextromethorphan , morphine  injection, ondansetron  (ZOFRAN ) IV, mouth rinse   Vital Signs    Vitals:   05/17/24 0400 05/17/24 0500 05/17/24 0600 05/17/24 0800  BP: (!) 113/55   133/81  Pulse: 65  62 83  Resp: 18  14 18   Temp:  97.8 F (36.6 C)  97.8 F (36.6 C)  TempSrc:    Oral  SpO2: 97%  96% 100%  Weight:  75.9 kg    Height:        Intake/Output Summary (Last 24 hours) at 05/17/2024 1146 Last data filed at 05/17/2024 1016 Gross per 24 hour  Intake 564.03 ml  Output 3050 ml  Net -2485.97 ml      05/17/2024    5:00 AM 05/16/2024    3:56 AM 05/15/2024    5:00 AM  Last 3 Weights  Weight (lbs) 167 lb 5.3 oz 164 lb 7.4 oz 161 lb 13.1 oz  Weight (kg) 75.9 kg 74.6 kg 73.4 kg      Telemetry    SR - Personally  Reviewed  Physical Exam   GEN: No acute distress.  On O2 by [place]: No JVD visualized Cardiac: RRR, no murmurs, rubs, or gallops.  Respiratory: Slightly coarse throughout, did not appreciate rales GI: Soft, nontender, non-distended  MS: No edema; No deformity. Neuro:  Nonfocal  Psych: Normal affect   New pertinent results (labs, ECG, imaging, cardiac studies)    Echo this admission personally reviewed, see comments below CT chest images personally reviewed, see comments below  Assessment & Plan    Acute hypoxic respiratory failure -required bipap on presentation, now on  at 4L/min and weaning -2/2 acute systolic and diastolic heart failure, though concern for amiodarone  toxicity. Also had leukocytosis to 24 on presentation, but procalcitonin <0.10 -EF 40-45% this admission, elevated RVSP of 54, dilated RV -concern for PE given elevated d-dimer, echo findings. However, has been on apixaban  chronically -has been on amiodarone , had CT chest noncontrast this admission. Shows small bilateral pleural effusions, septal thickening, ground glass opacities. This is somewhat nonspecific, but the septal thickening/ground glass opacities could be amiodarone  toxicity vs fluid vs other etiologies. Amiodarone  has been stopped. Would  consider high resolution chest CT once euvolemic, as acute heart failure makes diagnosis difficult. -avoiding beta blocker given history of bradycardia -admission weight 74.8, today's weight 75.9 kg (? Accuracy) but charted next negative 4.5L -K 3.3, repleting. Cr 0.75, stable -continue IV diuresis   Concern for prolonged QT: I personally reviewed ECG--there are abnormal ST-T patterns in anterolateral leads that cannot be used to assess QT. Measured with calipers in inferior limb leads, measured 400 and 436 msec. Amiodarone  has already been stopped. Would avoid other QT prolonging medications. I stopped the order for phenergan PRN (has not used). There is note of zofran   PRN as available on med list in note, but no active order for this, would not use zofran . Both donepezil  and venlafaxine  have potential for QT prolongation. Defer to primary team if there are acceptable alternatives or if benefits outweigh potential risk.   Paroxysmal atrial fibrillation:  -chadsvasc=7 -on apixaban  as outpatient, this is held for now, on heparin , planned for [facility] early next week -stopped amiodarone  due to concerns for toxicity/prolonged QT   CAD L shoulder pain, now resolved -known severe CAD from cath in 2022, recommended medical management -hsTn downtrended even with L shoulder pain, suggests this is not ischemic in etiology -on heparin  drip as above, planned for cath early next week -continue aspirin , statin -suspect given her known severe CAD and presentation this admission, this may be demand rather than ACS. We discussed this, and she is amenable to both [facility].   Tentative plan for [facility] on 5/27, time TBD, would make NPO tomorrow night at midnight.    Signed, Sheryle Donning, MD  05/17/2024, 11:46 AM

## 2024-05-17 NOTE — Care Management Important Message (Signed)
 Important Message  Patient Details  Name: Alison Richardson MRN: [id number] Date of Birth: [birth year]   Important Message Given:  Yes - Medicare IM     Anise Kerns 05/17/2024, 12:58 PM

## 2024-05-17 NOTE — Plan of Care (Signed)
  Problem: Education: Goal: Understanding of cardiac disease, CV risk reduction, and recovery process will improve Outcome: Progressing   Problem: Activity: Goal: Ability to tolerate increased activity will improve Outcome: Progressing   Problem: Cardiac: Goal: Ability to achieve and maintain adequate cardiovascular perfusion will improve Outcome: Progressing   Problem: Health Behavior/Discharge Planning: Goal: Ability to safely manage health-related needs after discharge will improve Outcome: Progressing   Problem: Clinical Measurements: Goal: Diagnostic test results will improve Outcome: Progressing Goal: Respiratory complications will improve Outcome: Progressing Goal: Cardiovascular complication will be avoided Outcome: Progressing   Problem: Activity: Goal: Risk for activity intolerance will decrease Outcome: Progressing   Problem: Elimination: Goal: Will not experience complications related to bowel motility Outcome: Progressing   Problem: Pain Managment: Goal: General experience of comfort will improve and/or be controlled Outcome: Progressing   Problem: Activity: Goal: Ability to tolerate increased activity will improve Outcome: Progressing Goal: Will verbalize the importance of balancing activity with adequate rest periods Outcome: Progressing

## 2024-05-18 DIAGNOSIS — I214 Non-ST elevation (NSTEMI) myocardial infarction: Principal | ICD-10-CM

## 2024-05-18 DIAGNOSIS — J9601 Acute respiratory failure with hypoxia: Secondary | ICD-10-CM | POA: Diagnosis not present

## 2024-05-18 DIAGNOSIS — I509 Heart failure, unspecified: Secondary | ICD-10-CM | POA: Diagnosis not present

## 2024-05-18 DIAGNOSIS — I5043 Acute on chronic combined systolic (congestive) and diastolic (congestive) heart failure: Secondary | ICD-10-CM | POA: Diagnosis not present

## 2024-05-18 DIAGNOSIS — Z515 Encounter for palliative care: Secondary | ICD-10-CM | POA: Diagnosis not present

## 2024-05-18 DIAGNOSIS — Z7189 Other specified counseling: Secondary | ICD-10-CM | POA: Diagnosis not present

## 2024-05-18 DIAGNOSIS — I480 Paroxysmal atrial fibrillation: Secondary | ICD-10-CM | POA: Diagnosis not present

## 2024-05-18 LAB — CBC
HCT: 31.4 % — ABNORMAL LOW (ref 36.0–46.0)
Hemoglobin: 10.2 g/dL — ABNORMAL LOW (ref 12.0–15.0)
MCH: 26.6 pg (ref 26.0–34.0)
MCHC: 32.5 g/dL (ref 30.0–36.0)
MCV: 82 fL (ref 80.0–100.0)
Platelets: 313 10*3/uL (ref 150–400)
RBC: 3.83 MIL/uL — ABNORMAL LOW (ref 3.87–5.11)
RDW: 15.2 % (ref 11.5–15.5)
WBC: 13.5 10*3/uL — ABNORMAL HIGH (ref 4.0–10.5)
nRBC: 0.1 % (ref 0.0–0.2)

## 2024-05-18 LAB — GLUCOSE, CAPILLARY
Glucose-Capillary: 127 mg/dL — ABNORMAL HIGH (ref 70–99)
Glucose-Capillary: 173 mg/dL — ABNORMAL HIGH (ref 70–99)
Glucose-Capillary: 384 mg/dL — ABNORMAL HIGH (ref 70–99)

## 2024-05-18 LAB — HEPARIN LEVEL (UNFRACTIONATED): Heparin Unfractionated: 0.99 [IU]/mL — ABNORMAL HIGH (ref 0.30–0.70)

## 2024-05-18 LAB — APTT
aPTT: 81 s — ABNORMAL HIGH (ref 24–36)
aPTT: 65 s — ABNORMAL HIGH (ref 24–36)
aPTT: 131 s — ABNORMAL HIGH (ref 24–36)

## 2024-05-18 LAB — LIPOPROTEIN A (LPA): Lipoprotein (a): 47.6 nmol/L — ABNORMAL HIGH (ref <75.0)

## 2024-05-18 MED ORDER — HEPARIN BOLUS VIA INFUSION
1100.0000 [IU] | Freq: Once | INTRAVENOUS | Status: AC
  Administered 2024-05-18: 1100 [IU] via INTRAVENOUS
  Filled 2024-05-18: qty 1100

## 2024-05-18 MED ORDER — HEPARIN (PORCINE) 25000 UT/250ML-% IV SOLN
1650.0000 [IU]/h | INTRAVENOUS | Status: DC
  Administered 2024-05-18: 1650 [IU]/h via INTRAVENOUS

## 2024-05-18 MED ORDER — GABAPENTIN 100 MG PO CAPS
100.0000 mg | ORAL_CAPSULE | Freq: Every day | ORAL | Status: DC
  Administered 2024-05-18 – 2024-05-19 (×2): 100 mg via ORAL
  Filled 2024-05-18 (×2): qty 1

## 2024-05-18 MED ORDER — TRAZODONE HCL 50 MG PO TABS
50.0000 mg | ORAL_TABLET | Freq: Every evening | ORAL | Status: DC | PRN
  Administered 2024-05-18: 50 mg via ORAL
  Filled 2024-05-18: qty 1

## 2024-05-18 MED ORDER — ENSURE ENLIVE PO LIQD
237.0000 mL | Freq: Two times a day (BID) | ORAL | Status: DC
  Administered 2024-05-18 – 2024-05-20 (×2): 237 mL via ORAL

## 2024-05-18 NOTE — Progress Notes (Signed)
 Nutrition Follow-up  DOCUMENTATION CODES:   Not applicable  INTERVENTION:   -Continue regular diet -Continue MVI with minerals daily -Ensure Enlive po BID, each supplement provides 350 kcal and 20 grams of protein.  -D/c Boost Breeze po TID, each supplement provides 250 kcal and 9 grams of protein   NUTRITION DIAGNOSIS:   Increased nutrient needs related to chronic illness (CHF) as evidenced by estimated needs.  Ongoing  GOAL:   Patient will meet greater than or equal to 90% of their needs  Progressing   MONITOR:   PO intake, Supplement acceptance, Diet advancement  REASON FOR ASSESSMENT:   Consult Assessment of nutrition requirement/status  ASSESSMENT:   Pt with medical history significant for dementia, HTN, DM, CHF, atrial fibrillation on Eliquis  and amiodarone , PAD, recently hospitalized from 5/16 to 5/17 with hypotension that improved with medication adjustment, who presented from her facility with shortness of breath for which she received DuoNebs and Solu-Medrol  en route.  5/23- advanced to clear liquid diet 5/24- advanced to regular diet  Reviewed I/O's: -2.8 L x 24 hours and -6.6 L since admission  UOP: 3.3 L x 24 hours  Pt unavailable at time of visit. Attempted to speak with pt via call to hospital room phone, however, unable to reach.   Pt now on a regular diet with good appetite. Meal completions 50-100%. Pt also drinking Boost Breeze supplements.  Pt has experienced a 3.6% wt loss since admission. Suspect wt loss is related to diuresis (-6.6 L since admission).   Per cardiology notes, plan for cath on 05/19/24.   Palliative care following for goals of care discussions. Pt is DNR/ DNI; plan to treat the treatable.   Medications reviewed and include pulmicort , lasix , neurontin , lactulose, miralax , prednisone , senokot, and effexor .  Labs reviewed: K: 3.3, CBGS: 127-356 (inpatient orders for glycemic control are 0-9 units insulin  aspart TID with meals  and 10 units insulin  glargine-yfgn daily).    Diet Order:   Diet Order             Diet regular Room service appropriate? Yes; Fluid consistency: Thin  Diet effective now                   EDUCATION NEEDS:   Not appropriate for education at this time  Skin:  Skin Assessment: Reviewed RN Assessment  Last BM:  05/17/24  Height:   Ht Readings from Last 1 Encounters:  05/14/24 5' 5.98" (1.676 m)    Weight:   Wt Readings from Last 1 Encounters:  05/18/24 72.1 kg    Ideal Body Weight:  59.1 kg  BMI:  Body mass index is 25.67 kg/m.  Estimated Nutritional Needs:   Kcal:  1500-1700  Protein:  75-90 grams  Fluid:  1.5-1.7 L    Herschel Lords, RD, LDN, CDCES Registered Dietitian III Certified Diabetes Care and Education Specialist If unable to reach this RD, please use "RD Inpatient" group chat on secure chat between hours of 8am-4 pm daily

## 2024-05-18 NOTE — Plan of Care (Signed)
  Problem: Clinical Measurements: Goal: Ability to maintain clinical measurements within normal limits will improve Outcome: Progressing   Problem: Pain Managment: Goal: General experience of comfort will improve and/or be controlled Outcome: Progressing   Problem: Safety: Goal: Ability to remain free from injury will improve Outcome: Progressing

## 2024-05-18 NOTE — Plan of Care (Signed)

## 2024-05-18 NOTE — Progress Notes (Signed)
 PHARMACY - ANTICOAGULATION CONSULT NOTE  Pharmacy Consult for Heparin   Indication: chest pain/ACS  Allergies  Allergen Reactions   Pantoprazole  Nausea And Vomiting   Metformin And Related Diarrhea    Patient Measurements: Height: 5' 5.98" (167.6 cm) Weight: 72.1 kg (158 lb 15.2 oz) IBW/kg (Calculated) : 59.26 HEPARIN  DW (KG): 74.3  Vital Signs: Temp: 98.3 F (36.8 C) (05/26 0700) Temp Source: Oral (05/26 0700) BP: 119/64 (05/26 0700) Pulse Rate: 64 (05/26 0700)  Labs: Recent Labs    05/16/24 0625 05/16/24 1555 05/17/24 0138 05/17/24 0900 05/17/24 1811 05/18/24 0258 05/18/24 1059  HGB 10.0*  --  9.6*  --   --  10.2*  --   HCT 31.6*  --  29.9*  --   --  31.4*  --   PLT 311  --  296  --   --  313  --   APTT 54*   < > 71* 60* 52* 81* 65*  HEPARINUNFRC >1.10*  --  1.05*  --   --  0.99*  --   CREATININE 0.83  --   --  0.75  --   --   --   TROPONINIHS 67*  --   --   --   --   --   --    < > = values in this interval not displayed.    Estimated Creatinine Clearance: 48.5 mL/min (by C-G formula based on SCr of 0.75 mg/dL).   Medical History: Past Medical History:  Diagnosis Date   Acute on chronic congestive heart failure (HCC) 10/07/2020   Arrhythmia    Atrial fibrillation and flutter (HCC) 01/06/2019   Plavix  added to eliquis  10-22 after nstemi       Last Assessment & Plan:    Marked facial hematoma post fall but ct of neck and head reportedly normal. No ha or ataxia now but is using a walker. On eliquis  and plavix      Atrial flutter (HCC) 01/2018   new onset    Basal cell carcinoma of back    Basal cell carcinoma of lip    Cerebral aneurysm    followed by [facility]   CHF (congestive heart failure) (HCC)    DDD (degenerative disc disease), lumbar    superior plate depression, L3 08/18/2014   Diabetes mellitus type II, controlled (HCC)    Diverticulosis    Dysrhythmia    Paroxysmal Supraventricular Tachycardia   Dysthymia    depression   GERD  (gastroesophageal reflux disease)    History of falling 04/03/2022   History of meniscal tear    Humeral surgical neck fracture 05/16/2022   Hyperlipidemia    Hypertension    Hypokalemia 12/12/2022   Hypothyroidism    Late onset Alzheimer's disease with behavioral disturbance (HCC)    Leukocytosis 10/07/2020   Mild pulmonary hypertension (HCC)    Moderate asthma without complication 12/12/2022   Multifocal pneumonia 12/09/2022   Overactive bladder    Peripheral vascular disease (HCC)    Puncture wound of forehead 06/11/2023   Seasonal allergic rhinitis    Severe sepsis (HCC) 02/07/2022   Sleep apnea     Medications:  Medications Prior to Admission  Medication Sig Dispense Refill Last Dose/Taking   albuterol  (PROVENTIL ) (2.5 MG/3ML) 0.083% nebulizer solution Take 3 mLs (2.5 mg total) by nebulization every 6 (six) hours as needed for wheezing or shortness of breath. 75 mL 3 Taking As Needed   amiodarone  (PACERONE ) 200 MG tablet Take 1 tablet (200 mg total) by  mouth daily. 90 tablet 3 Taking   apixaban  (ELIQUIS ) 5 MG TABS tablet Take 1 tablet (5 mg total) by mouth 2 (two) times daily. 180 tablet 3 Taking   donepezil  (ARICEPT ) 10 MG tablet TAKE ONE TABLET (10 MG) BY MOUTH AT BEDTIME 90 tablet 0 Taking   gabapentin  (NEURONTIN ) 100 MG capsule Take 1 capsule (100 mg total) by mouth at bedtime.   Taking   levothyroxine  (SYNTHROID ) 88 MCG tablet TAKE 1 TABLET EVERY DAY ON EMPTY STOMACHWITH A GLASS OF WATER AT LEAST 30-60 MINBEFORE BREAKFAST 90 tablet 3 Taking   metoprolol  succinate (TOPROL -XL) 25 MG 24 hr tablet Take 0.5 tablets (12.5 mg total) by mouth daily. Take with or immediately following a meal.   Taking   nitroGLYCERIN  (NITROSTAT ) 0.4 MG SL tablet Place 1 tablet (0.4 mg total) under the tongue every 5 (five) minutes as needed for chest pain. 25 tablet 3 Taking As Needed   potassium chloride  SA (KLOR-CON  M) 20 MEQ tablet Take 1 tablet (20 mEq total) by mouth daily as needed. Take it on  the days you take Torsemide  30 tablet 0 Taking As Needed   pravastatin  (PRAVACHOL ) 20 MG tablet Take 1 tablet (20 mg total) by mouth at bedtime. 90 tablet 3 Taking   Semaglutide ,0.25 or 0.5MG /DOS, (OZEMPIC , 0.25 OR 0.5 MG/DOSE,) 2 MG/1.5ML SOPN Inject 0.5 mg into the skin once a week. 6 mL 1 Taking   Tiotropium Bromide Monohydrate  (SPIRIVA  RESPIMAT) 1.25 MCG/ACT AERS Inhale into the lungs.   Taking   torsemide  (DEMADEX ) 20 MG tablet Take 1 tablet (20 mg total) by mouth daily as needed. For leg edema or sob 30 tablet 0 Taking As Needed   venlafaxine  XR (EFFEXOR -XR) 75 MG 24 hr capsule TAKE 1 CAPSULE BY MOUTH ONCE DAILY. 90 capsule 0 Taking    Assessment: Pharmacy consulted to dose heparin  in this 89 year old female admitted with ACS/NSTEMI.  Pt was on Eliquis  5 mg PO BID PTA,  last dose on 5/21 @ 1900. CrCl = 49.3 ml/min   Date/Time aPTT/HL Rate  Comment 5/22 1259 62/-  900 units/hr Slightly subtherapeutic  5/22 2223        74                  1100 units/hr     therapeutic X 1 5/23 0553        89 />1.10       1100 units/hr     therapeutic X 2  5/24 0625 54/>1.10 1050 units/hr Subtherapeutic 5/24 1555 63/ -   1200 units/hr Subtherapeutic 5/25 0138       71/   1.05         1350 units/hr   therapeutic X 1  5/25 0900 aPTT 60 1350 units/hr Subtherapeutic  5/25 1811 aPTT=52 1450 units/hr Subtherapeutic 5/26 0258       81/ 0.99           1700 units/hr   Therapeutic X 1 5/26 1059 65/-  1700 units/hr Subtherapeutic    Goal of Therapy:  Heparin  level 0.3-0.7 units/ml aPTT 66 - 102 seconds Monitor platelets by anticoagulation protocol: Yes   Plan:  5/26 @ 1059:  aPTT = 65, SUBtherapeutic - bolus 1100 units x 1 - increase heparin  infusion rate to 1800 units/hr - recheck aPTT in 8 hrs - will recheck HL on 5/27 with AM labs - continue to monitor aPTT and heparin  levels until correlating - monitor CBC and signs/symptoms of bleeding  [facility]  A Kerryn Tennant 05/18/2024 11:46 AM

## 2024-05-18 NOTE — Progress Notes (Signed)
 PROGRESS NOTE Alison Richardson    DOB: [birth year], 89 y.o.  ZOX:[id number]    Code Status: Limited: Do not attempt resuscitation (DNR) -DNR-LIMITED -Do Not Intubate/DNI    DOA: 05/14/2024   LOS: 4   Brief hospital course  Alison Richardson is a 89 y.o. female with medical history significant for dementia, HTN, DM, CHF, atrial fibrillation on Eliquis  and amiodarone , PAD, recently hospitalized from 5/16 to 5/17 with hypotension that improved with medication adjustment, who presented to the ED with shortness of breath for which she received DuoNebs and Solu-Medrol  en route.    ED course: Tachypneic to the low 30s with elevated blood pressure of 153/105, hypoxic to 87% on 4 L when she arrived, subsequently transitioned to HFNC and then BiPAP Labs notable for: VBG on BiPAP FiO2 60% with pH 7.36 and PCO244. WBC of 24,000 with lactic acid 2.8 and negative respiratory viral panel. Troponin 14--108 with BNP 290. Lipase and LFTs unremarkable  EKG: sinus at 70 with no acute ST-T wave changes Chest x-ray mild to moderate cardiogenic failure  Remains chest pain free and on heparin  gtt while awaiting ischemic evaluation/cath tentatively planned for 5/27. Cardiology consulted.  Respiratory status remains precarious on high O2 requirement but she is gradually improving. Being treated with steroids, antibiotics, breathing treatments. Pulmonology consulted.   Encouraging OOB if tolerated.   Assessment & Plan  Principal Problem:   Acute on chronic combined systolic and diastolic CHF (congestive heart failure) (HCC) Active Problems:   NSTEMI (non-ST elevated myocardial infarction) (HCC)   CAD (coronary artery disease)   Acute respiratory failure with hypoxia (HCC)   Hypothyroidism   Leukocytosis   Paroxysmal atrial fibrillation (HCC)   Depression with anxiety   Cognitive impairment, mild, so stated   PAD (peripheral artery disease) (HCC)   DM type 2 (diabetes mellitus, type 2) (HCC)   Lactic acidosis  Acute  on chronic diastolic CHF- Signs of pulmonary edema on imaging. Had echo January 2025 with EF 60 to 65%. Repeat echo this admission showed EF 40-45%, global hypokinesis, moderately enlarged RV, moderately elevated pulmonary artery pressure (while on bipap). Mild-mod TV regurg. - management per cardiology  - Continue IV Lasix  - strict I/O - weaned off nitroglycerin  gtt - Hypotension precludes from further GDMT at this time. Daily weights with intake and output monitoring - OOB and ambulate as tolerated   NSTEMI  CAD- Troponin 14>108, without acute EKG changes or complaints of chest pain On heparin  infusion so will hold Eliquis  Continue pravastatin .  Continue metoprolol  as BP would tolerate - tentatively planning heart cath 5/27 once respiratory status has stabilized.    Acute respiratory failure with hypoxia- no baseline O2 requirement. Weaned from bipap. Echo shows significant increase in pulmonary pressures but may have been completed while on bipap so would interfere with results. Pulmonology consulted to review.  - breathing treatments - steroids  - diuresis as above - palliative consulted - consulting pulmonology, Dr. Meredeth Stallion  Constipation- still no BM. Abdomen is soft and non-tender. Patient is asymptomatic. Had a few morphine  doses since admission. bowel regimen increased - PRN enema    Paroxysmal atrial fibrillation (HCC) On heparin  infusion so we will hold apixaban  - discontinued amiodarone  and metoprolol  in setting of low BP/HR    Hypothyroidism Continue levothyroxine   Depression with anxiety Continue venlafaxine    DM type 2 (diabetes mellitus, type 2) (HCC) Sliding scale insulin  coverage - continue semglee  as CBGs increased on steroid use   PAD (peripheral artery disease) (HCC) Continue pravastatin   Currently on aspirin    Cognitive impairment, mild, so stated Delirium precautions  Body mass index is 25.67 kg/m.  VTE ppx: heparin   Diet:     Diet   Diet  regular Room service appropriate? Yes; Fluid consistency: Thin   Consultants: Cardiology  Pulmonology   Subjective 05/18/24    Pt reports that she is overall doing well. Denies SOB. Good UOP. Unable to get to bedside commode in time to void so requests purewick continue   Objective  Blood pressure (!) 148/89, pulse 65, temperature 97.9 F (36.6 C), temperature source Oral, resp. rate (!) 22, SpO2 96%.  Intake/Output Summary (Last 24 hours) at 05/18/2024 0820 Last data filed at 05/18/2024 0332 Gross per 24 hour  Intake 480 ml  Output 3300 ml  Net -2820 ml   Filed Weights   05/16/24 0356 05/17/24 0500 05/18/24 0505  Weight: 74.6 kg 75.9 kg 72.1 kg   Physical Exam Vitals and nursing note reviewed.  Constitutional:      General: She is not in acute distress.    Appearance: She is not ill-appearing.  HENT:     Head: Normocephalic.     Mouth/Throat:     Mouth: Mucous membranes are moist.  Cardiovascular:     Rate and Rhythm: Normal rate and regular rhythm.  Pulmonary:     Effort: Pulmonary effort is normal. No tachypnea, accessory muscle usage or respiratory distress.     Breath sounds: Decreased air movement present. Rhonchi present. No decreased breath sounds.  Musculoskeletal:     Right lower leg: No edema.     Left lower leg: No edema.  Skin:    General: Skin is warm and dry.  Neurological:     General: No focal deficit present.     Mental Status: She is alert.  Psychiatric:        Mood and Affect: Mood normal.   Labs   I have personally reviewed the following labs and imaging studies CBC    Component Value Date/Time   WBC 13.5 (H) 05/18/2024 0258   RBC 3.83 (L) 05/18/2024 0258   HGB 10.2 (L) 05/18/2024 0258   HGB 11.2 04/13/2024 1357   HCT 31.4 (L) 05/18/2024 0258   HCT 35.7 04/13/2024 1357   PLT 313 05/18/2024 0258   PLT 419 04/13/2024 1357   MCV 82.0 05/18/2024 0258   MCV 85 04/13/2024 1357   MCH 26.6 05/18/2024 0258   MCHC 32.5 05/18/2024 0258   RDW  15.2 05/18/2024 0258   RDW 14.6 04/13/2024 1357   LYMPHSABS 4.5 (H) 05/14/2024 0034   LYMPHSABS 2.5 04/13/2024 1357   MONOABS 1.4 (H) 05/14/2024 0034   EOSABS 0.3 05/14/2024 0034   EOSABS 0.2 04/13/2024 1357   BASOSABS 0.1 05/14/2024 0034   BASOSABS 0.1 04/13/2024 1357      Latest Ref Rng & Units 05/17/2024    9:00 AM 05/16/2024    6:25 AM 05/15/2024    5:53 AM  BMP  Glucose 70 - 99 mg/dL 161  096  045   BUN 8 - 23 mg/dL 21  22  28    Creatinine 0.44 - 1.00 mg/dL 4.09  8.11  9.14   Sodium 135 - 145 mmol/L 139  141  136   Potassium 3.5 - 5.1 mmol/L 3.3  4.0  3.7   Chloride 98 - 111 mmol/L 100  105  104   CO2 22 - 32 mmol/L 26  25  22    Calcium  8.9 - 10.3 mg/dL 8.6  8.6  7.8     No results found.   Disposition Plan & Communication  Patient status: Inpatient  Admitted From: Home Planned disposition location: TBD Anticipated discharge date: TBD pending clinical course  Family Communication: sons and daughter at bedside    Author: Ree Candy, DO [facility] 05/18/2024, 8:20 AM   Available by Epic secure chat 7AM-7PM. If 7PM-7AM, please contact night-coverage.  [facility] contact information found on [email or web address].

## 2024-05-18 NOTE — Progress Notes (Signed)
 Palliative Care Progress Note, Assessment & Plan   Patient Name: Alison Richardson       Date: 05/18/2024 DOB: 24-Feb-1933  Age: 89 y.o. MRN#: [id number] Attending Physician: Ree Candy, MD Primary Care Physician: Hadassah Letters, MD Admit Date: 05/14/2024  Subjective: Reports not sleeping last night due to neuropathy. Relief with pain medication. Denies pain at this time. Denies CP/SOB. Ate breakfast this morning. John, son and Asa Lauth, son-in-law express concern that patient has not had a BM since she has been here. Patient and family are prepared for planned heart cath tomorrow.   HPI: "Alison Richardson is a 89 y.o. female with medical history significant for dementia, HTN, DM, CHF, atrial fibrillation on Eliquis  and amiodarone , PAD, recently hospitalized from 5/16 to 5/17 with hypotension that improved with medication adjustment, who presented to the ED brought in by EMS from her facility with shortness of breath for which she received DuoNebs and Solu-Medrol  en route.  She has no history of COPD.  Daughter at bedside contributes to history.  Patient has had no chest pain, lower extremity pain or swelling and no cough or fever. ED course and data review: Tachypneic to the low 30s with elevated blood pressure of 153/105, requiring hypoxic to 87% on 4 L on which she arrived, subsequently transitioned to HFNC and then BiPAP".  PMT was consulted for goals of care discussion.   Summary of counseling/coordination of care: Extensive chart review completed prior to meeting patient including labs, vital signs, imaging, progress notes, orders, and available advanced directive documents from current and previous encounters.   After reviewing the patient's chart and assessing the patient at bedside, I spoke with patient  in regards to symptom management and goals of care.   Elderly, ill-appearing female resting in bed. She appears more tired that yesterday. Even, unlabored respirations. She is in no distress.   Family questioned reasoning for DNR not being active for the heart cath. Shared will patient and family that this is standard procedure for patients undergoing surgical interventions. They verbalize understanding. They have not other concerns at this time.    Therapeutic silence and active listening provided for patient and family to share their thoughts and emotions regarding current medical situation.  Emotional support provided.  Advised that PMT will follow peripherally but remain available of needed.   Physical Exam Vitals reviewed.  Constitutional:      General: She is not in acute distress.    Appearance: She is ill-appearing.  HENT:     Head: Normocephalic and atraumatic.     Nose:     Comments: O2 via [place]    Mouth/Throat:     Mouth: Mucous membranes are moist.  Pulmonary:     Effort: Pulmonary effort is normal. No respiratory distress.  Skin:    General: Skin is warm and dry.  Neurological:     Mental Status: She is alert and oriented to person, place, and time.  Psychiatric:        Mood and Affect: Mood normal.        Behavior: Behavior normal.   Recommendations/Plan: Continue DNR/DNI status as previously documented    Continue current supportive interventions PMT to follow peripherally, available  if needed.              Total Time 50 minutes   Time spent includes: Detailed review of medical records (labs, imaging, vital signs), medically appropriate exam (mental status, respiratory, cardiac, skin), discussed with treatment team, counseling and educating patient, family and staff, documenting clinical information, medication management and coordination of care.     Ina Manas, Joyice Nodal [facility] Palliative Medicine Team  05/18/2024 8:48 AM  Office [phone]  Pager  [phone]

## 2024-05-18 NOTE — H&P (View-Only) (Signed)
 Progress Note  Patient Name: Alison Richardson Date of Encounter: 05/18/2024  Primary Cardiologist: Jerelene Monday  Subjective   No further chest or left shoulder pain.  Breathing much improved, reports back to baseline.  Supplemental oxygen has been weaned to 2 to 3 L via nasal cannula.  Documented urine output 2.8 L for the past 24 hours, net -6.5 L for the admission.  Weight trend 75.9 to 72.1 kg over the past 24 hours.  Inpatient Medications    Scheduled Meds:  aspirin  EC  81 mg Oral Daily   budesonide  (PULMICORT ) nebulizer solution  0.5 mg Nebulization BID   donepezil   10 mg Oral QHS   feeding supplement  1 Container Oral TID BM   furosemide   40 mg Intravenous BID   insulin  aspart  0-9 Units Subcutaneous TID WC   insulin  glargine-yfgn  10 Units Subcutaneous Daily   ipratropium-albuterol   3 mL Nebulization TID   lactulose  20 g Oral Once   levothyroxine   88 mcg Oral Q0600   multivitamin with minerals  1 tablet Oral Daily   polyethylene glycol  17 g Oral BID   pravastatin   20 mg Oral QHS   predniSONE   20 mg Oral Q breakfast   senna  1 tablet Oral Daily   venlafaxine  XR  75 mg Oral Daily   Continuous Infusions:  heparin  1,700 Units/hr (05/18/24 0643)   PRN Meds: acetaminophen , albuterol , guaiFENesin -dextromethorphan , morphine  injection, ondansetron  (ZOFRAN ) IV, mouth rinse, traZODone    Vital Signs    Vitals:   05/18/24 0300 05/18/24 0505 05/18/24 0700 05/18/24 0757  BP: 136/69  119/64   Pulse: 66  64   Resp:      Temp: 98.2 F (36.8 C)  98.3 F (36.8 C)   TempSrc: Axillary  Oral   SpO2: 98%  (!) 87% 98%  Weight:  72.1 kg    Height:        Intake/Output Summary (Last 24 hours) at 05/18/2024 0943 Last data filed at 05/18/2024 0332 Gross per 24 hour  Intake 480 ml  Output 3300 ml  Net -2820 ml   Filed Weights   05/16/24 0356 05/17/24 0500 05/18/24 0505  Weight: 74.6 kg 75.9 kg 72.1 kg    Telemetry    Sinus rhythm, 70s to 80s bpm - Personally Reviewed  ECG     No new tracings - Personally Reviewed  Physical Exam   GEN: No acute distress.   Neck: No JVD. Cardiac: RRR, no murmurs, rubs, or gallops.  Respiratory: Improving breath sounds bilaterally with faint coarseness.  Supplemental oxygen via nasal cannula at 3 L noted. GI: Soft, nontender, non-distended.   MS: No edema; No deformity. Neuro:  Alert and oriented x 3; Nonfocal.  Psych: Normal affect.  Labs    Chemistry Recent Labs  Lab 05/14/24 0034 05/15/24 0553 05/16/24 0625 05/17/24 0900  NA 139 136 141 139  K 3.5 3.7 4.0 3.3*  CL 107 104 105 100  CO2 19* 22 25 26   GLUCOSE 230* 185* 235* 137*  BUN 13 28* 22 21  CREATININE 0.75 0.90 0.83 0.75  CALCIUM  8.9 7.8* 8.6* 8.6*  PROT 7.2  --   --   --   ALBUMIN 4.0  --   --   --   AST 49*  --   --   --   ALT 39  --   --   --   ALKPHOS 89  --   --   --   BILITOT 1.0  --   --   --  GFRNONAA >60 >60 >60 >60  ANIONGAP 13 10 11 13      Hematology Recent Labs  Lab 05/16/24 0625 05/17/24 0138 05/18/24 0258  WBC 19.2* 17.8* 13.5*  RBC 3.79* 3.62* 3.83*  HGB 10.0* 9.6* 10.2*  HCT 31.6* 29.9* 31.4*  MCV 83.4 82.6 82.0  MCH 26.4 26.5 26.6  MCHC 31.6 32.1 32.5  RDW 15.6* 15.5 15.2  PLT 311 296 313    Cardiac EnzymesNo results for input(s): "TROPONINI" in the last 168 hours. No results for input(s): "TROPIPOC" in the last 168 hours.   BNP Recent Labs  Lab 05/14/24 0034  BNP 290.1*     DDimer  Recent Labs  Lab 05/14/24 0423  DDIMER 1.20*     Radiology    No results found.  Cardiac Studies   2D echo 05/14/2024: 1. Left ventricular ejection fraction, by estimation, is 40 to 45%. The  left ventricle has mild to moderately decreased function. The left  ventricle demonstrates global hypokinesis. Left ventricular diastolic  parameters are indeterminate.   2. Right ventricular systolic function is normal. The right ventricular  size is moderately enlarged. There is moderately elevated pulmonary artery  systolic  pressure. The estimated right ventricular systolic pressure is  54.3 mmHg.   3. The mitral valve is normal in structure. Mild mitral valve  regurgitation.   4. Tricuspid valve regurgitation is mild to moderate.   5. The aortic valve is tricuspid. Aortic valve regurgitation is not  visualized.   6. The inferior vena cava is normal in size with greater than 50%  respiratory variability, suggesting right atrial pressure of 3 mmHg.  __________   2D echo 12/31/2023: 1. Left ventricular ejection fraction, by estimation, is 60 to 65%. The  left ventricle has normal function. The left ventricle has no regional  wall motion abnormalities. Left ventricular diastolic parameters are  indeterminate.   2. Right ventricular systolic function is normal. The right ventricular  size is normal. There is mildly elevated pulmonary artery systolic  pressure. The estimated right ventricular systolic pressure is 40.8 mmHg.   3. Left atrial size was mild to moderately dilated.   4. The mitral valve is normal in structure. Mild to moderate mitral valve  regurgitation. No evidence of mitral stenosis.   5. Tricuspid valve regurgitation is mild to moderate.   6. The aortic valve has an indeterminant number of cusps. Aortic valve  regurgitation is not visualized. No aortic stenosis is present.   7. The inferior vena cava is normal in size with greater than 50%  respiratory variability, suggesting right atrial pressure of 3 mmHg.  __________   2D echo 04/21/2023: 1. Left ventricular ejection fraction, by estimation, is 55 to 60%. Left  ventricular ejection fraction by PLAX is 67 %. The left ventricle has  normal function. The left ventricle has no regional wall motion  abnormalities. Left ventricular diastolic  parameters are indeterminate.   2. Right ventricular systolic function is normal. The right ventricular  size is normal. There is mildly elevated pulmonary artery systolic  pressure. The estimated right  ventricular systolic pressure is 38.3 mmHg.   3. The mitral valve is normal in structure. Moderate mitral valve  regurgitation. No evidence of mitral stenosis.   4. Tricuspid valve regurgitation is moderate to severe.   5. The aortic valve is tricuspid. Aortic valve regurgitation is not  visualized. No aortic stenosis is present.   6. The inferior vena cava is normal in size with greater than 50%  respiratory  variability, suggesting right atrial pressure of 3 mmHg.  __________   2D echo 12/11/2022: 1. Left ventricular ejection fraction, by estimation, is 50 to 55%. The  left ventricle has low normal function. The left ventricle has no regional  wall motion abnormalities. Left ventricular diastolic parameters were  normal.   2. Right ventricular systolic function is normal. The right ventricular  size is normal.   3. The mitral valve is normal in structure. Mild mitral valve  regurgitation. No evidence of mitral stenosis.   4. The aortic valve is normal in structure. Aortic valve regurgitation is  not visualized. No aortic stenosis is present.   5. The inferior vena cava is normal in size with greater than 50%  respiratory variability, suggesting right atrial pressure of 3 mmHg.  __________   2D echo 10/31/2021: 1. Left ventricular ejection fraction, by estimation, is 45 to 50%. The  left ventricle has mildly decreased function. The left ventricle has no  regional wall motion abnormalities. Left ventricular diastolic parameters  were normal.   2. Right ventricular systolic function is normal. The right ventricular  size is normal.   3. The mitral valve is normal in structure. Mild mitral valve  regurgitation. No evidence of mitral stenosis.   4. The aortic valve is normal in structure. Aortic valve regurgitation is  not visualized. No aortic stenosis is present.   5. The inferior vena cava is normal in size with greater than 50%  respiratory variability, suggesting right atrial  pressure of 3 mmHg.  __________   LHC 10/12/2021:   Ost RCA to Prox RCA lesion is 99% stenosed.   Dist RCA lesion is 65% stenosed.   RPDA lesion is 60% stenosed.   1st Diag lesion is 99% stenosed.   Ost Cx to Prox Cx lesion is 25% stenosed.   Ost LAD to Prox LAD lesion is 25% stenosed.   There is mild left ventricular systolic dysfunction.   LV end diastolic pressure is mildly elevated.   The left ventricular ejection fraction is 35-45% by visual estimate.   89 year old female with known chronic nonvalvular atrial fibrillation hypertension hyperlipidemia with acute non-ST elevation myocardial infarction   Cardiac catheterization showing mild inferior hypokinesis and apical hypokinesis with ejection fraction of 40 to 45%   Severe and/or critical stenosis of ostial right coronary artery and moderate atherosclerosis of distal right coronary artery and PDA complex in nature with good collaterals from left anterior descending artery Mild atherosclerosis of left anterior descending artery and circumflex artery   After discussion with cardiovascular team would best be medically manage at this time due to complex lesions and not ideal for PCI and stent placement   Plan Isosorbide  for better collateral flow 2.  Increase beta-blocker for better heart rate control of atrial fibrillation with a goal heart rate between 60 and 90 bpm 3.  Reinstate anticoagulation for further risk reduction and stroke with atrial fibrillation 4.  Single antiplatelet therapy with Plavix  5.  Hypertension control and high intensity cholesterol therapy 6.  Cardiac rehabilitation __________   2D echo 10/07/2020: 1. Left ventricular ejection fraction, by estimation, is 60 to 65%. The  left ventricle has normal function. The left ventricle has no regional  wall motion abnormalities. Left ventricular diastolic parameters are  consistent with Grade I diastolic  dysfunction (impaired relaxation).   2. Right ventricular  systolic function is normal. The right ventricular  size is normal.   3. The mitral valve is grossly normal. Mild mitral valve regurgitation.  4. The aortic valve is normal in structure. Aortic valve regurgitation is  trivial.  __________   2D echo 09/29/2019: 1. Left ventricular ejection fraction, by visual estimation, is 60 to  65%. The left ventricle has normal function. Left ventricular septal wall  thickness was mildly increased. Mildly increased left ventricular  posterior wall thickness. There is mildly  increased left ventricular hypertrophy.   2. Global right ventricle has normal systolic function.The right  ventricular size is mildly enlarged. No increase in right ventricular wall  thickness.   3. Left atrial size was mildly dilated.   4. Right atrial size was normal.   5. The mitral valve is grossly normal. Mild mitral valve regurgitation.   6. The tricuspid valve is grossly normal. Tricuspid valve regurgitation  is mild.   7. The aortic valve is tricuspid Aortic valve regurgitation was not  visualized by color flow Doppler.   8. The pulmonic valve was not well visualized. Pulmonic valve  regurgitation is trivial by color flow Doppler.   9. Moderately elevated pulmonary artery systolic pressure.  10. No shunts by color flow doppler.   Patient Profile     89 y.o. female with history of CAD medically managed, persistent A-fib, HFimpEF, valvular heart disease, DM2, HTN, HLD, and hypothyroidism who is being seen today for the evaluation of HFrEF and elevated troponin at the request of Dr. Vallarie Gauze.   Assessment & Plan    1. Acute hypoxic respiratory failure: - Sudden onset of dyspnea on the evening of 5/21 - Previously required BiPAP upon presentation, now weaned to to supplemental oxygen via nasal cannula at 3 L - Volume status improving - Concern for possible amiodarone  toxicity as well with CT this admission nonspecific with septal thickening and groundglass opacities  possibly related to amiodarone  toxicity versus fluid versus other alternative etiologies, leading to medication discontinuation - Echo with new cardiomyopathy with an EF of 40 to 45% (LV systolic function normal by echo in 12/2023) as well as elevated RVSP estimated at 54 (on BiPAP at that time), and dilated RV - D-dimer elevated at 1.2, consider evaluation for PE given acute hypoxic respiratory failure, dilated RV, and elevated RVSP (however, PE is presumably unlikely as long as she has been adherent to apixaban .  CT of the chest may also help guide ongoing pharmacotherapy, and to evaluate for possible amiodarone  toxicity), will defer to primary service - Degree of hypoxia appears to be out of proportion to heart failure - Continue to wean supplemental oxygen as able - Pulmonology on board - Planning for [facility] on 5/27 - N.p.o. at midnight - DNR rescinded for cardiac cath   2.  CAD involving the native coronary arteries with elevated high-sensitivity troponin: - Known severe CAD from 2022 with medical management recommended at that time  - Episode of chest and left shoulder discomfort over the weekend, now resolved - Troponin peaked at 282, now down trending, possibly demand in etiology in the setting of known severe CAD and presentation with hypoxia - On heparin  drip  - PTA aspirin  and statin - N.p.o. for [facility] on 5/27   3.  Acute on chronic HFrEF with pulmonary hypertension: - EF previously 35 to 45% by LV gram in 09/2021 and 45 to 50% by echo in 10/2021, subsequently normalized -LV systolic function now reduced to 40 to 45% - Volume improving - Continue IV furosemide  40 mg twice daily  - Not currently on beta-blocker (previously bradycardic in the 40s bpm at office visit in 11/2023  leading to decrease of beta-blocker dose to 50 mg at that time) - Escalate GDMT as able, relative hypotension precludes at this time - Cardiac cath planned for 5/27   3.  Persistent A-fib: - Maintaining sinus  rhythm  - Amiodarone  discontinued due to concerns for toxicity/prolonged QT -TSH normal  - Avoid QT prolonging medications - AST mildly elevated this admission with normal ALT, continue with periodic monitoring - CHA2DS2-VASc at least 7 (CHF, HTN, age x 2, DM, vascular disease, sex category) - Last dose of apixaban  on the evening of 5/21 - IV heparin  while admitted until it is clear she will not require invasive testing   4.  Valvular heart disease: - Stable     Informed Consent   Shared Decision Making/Informed Consent{  The risks [stroke (1 in 1000), death (1 in 1000), kidney failure [usually temporary] (1 in 500), bleeding (1 in 200), allergic reaction [possibly serious] (1 in 200)], benefits (diagnostic support and management of coronary artery disease) and alternatives of a cardiac catheterization were discussed in detail with Ms. Willison and she is willing to proceed.      For questions or updates, please contact [facility] Please consult [email or web address] for contact info under Cardiology/STEMI.    Signed, Varney Gentleman, PA-C [facility] Pager: [phone] 05/18/2024, 9:43 AM

## 2024-05-18 NOTE — Plan of Care (Signed)
  Problem: Activity: Goal: Ability to tolerate increased activity will improve Outcome: Progressing   Problem: Cardiac: Goal: Ability to achieve and maintain adequate cardiovascular perfusion will improve Outcome: Progressing   Problem: Health Behavior/Discharge Planning: Goal: Ability to safely manage health-related needs after discharge will improve Outcome: Progressing   Problem: Education: Goal: Knowledge of General Education information will improve Description: Including pain rating scale, medication(s)/side effects and non-pharmacologic comfort measures Outcome: Progressing

## 2024-05-18 NOTE — Progress Notes (Signed)
 PHARMACY - ANTICOAGULATION CONSULT NOTE  Pharmacy Consult for Heparin   Indication: chest pain/ACS  Allergies  Allergen Reactions   Pantoprazole  Nausea And Vomiting   Metformin And Related Diarrhea    Patient Measurements: Height: 5' 5.98" (167.6 cm) Weight: 72.1 kg (158 lb 15.2 oz) IBW/kg (Calculated) : 59.26 HEPARIN  DW (KG): 74.3  Vital Signs: Temp: 97.8 F (36.6 C) (05/26 1943) Temp Source: Oral (05/26 1200) BP: 127/70 (05/26 1943) Pulse Rate: 71 (05/26 1943)  Labs: Recent Labs    05/16/24 0625 05/16/24 1555 05/17/24 0138 05/17/24 0900 05/17/24 1811 05/18/24 0258 05/18/24 1059 05/18/24 2127  HGB 10.0*  --  9.6*  --   --  10.2*  --   --   HCT 31.6*  --  29.9*  --   --  31.4*  --   --   PLT 311  --  296  --   --  313  --   --   APTT 54*   < > 71* 60*   < > 81* 65* 131*  HEPARINUNFRC >1.10*  --  1.05*  --   --  0.99*  --   --   CREATININE 0.83  --   --  0.75  --   --   --   --   TROPONINIHS 67*  --   --   --   --   --   --   --    < > = values in this interval not displayed.    Estimated Creatinine Clearance: 48.5 mL/min (by C-G formula based on SCr of 0.75 mg/dL).   Medical History: Past Medical History:  Diagnosis Date   Acute on chronic congestive heart failure (HCC) 10/07/2020   Arrhythmia    Atrial fibrillation and flutter (HCC) 01/06/2019   Plavix  added to eliquis  10-22 after nstemi       Last Assessment & Plan:    Marked facial hematoma post fall but ct of neck and head reportedly normal. No ha or ataxia now but is using a walker. On eliquis  and plavix      Atrial flutter (HCC) 01/2018   new onset    Basal cell carcinoma of back    Basal cell carcinoma of lip    Cerebral aneurysm    followed by [facility]   CHF (congestive heart failure) (HCC)    DDD (degenerative disc disease), lumbar    superior plate depression, L3 08/18/2014   Diabetes mellitus type II, controlled (HCC)    Diverticulosis    Dysrhythmia    Paroxysmal Supraventricular Tachycardia    Dysthymia    depression   GERD (gastroesophageal reflux disease)    History of falling 04/03/2022   History of meniscal tear    Humeral surgical neck fracture 05/16/2022   Hyperlipidemia    Hypertension    Hypokalemia 12/12/2022   Hypothyroidism    Late onset Alzheimer's disease with behavioral disturbance (HCC)    Leukocytosis 10/07/2020   Mild pulmonary hypertension (HCC)    Moderate asthma without complication 12/12/2022   Multifocal pneumonia 12/09/2022   Overactive bladder    Peripheral vascular disease (HCC)    Puncture wound of forehead 06/11/2023   Seasonal allergic rhinitis    Severe sepsis (HCC) 02/07/2022   Sleep apnea     Medications:  Medications Prior to Admission  Medication Sig Dispense Refill Last Dose/Taking   albuterol  (PROVENTIL ) (2.5 MG/3ML) 0.083% nebulizer solution Take 3 mLs (2.5 mg total) by nebulization every 6 (six) hours as needed for wheezing  or shortness of breath. 75 mL 3 Taking As Needed   amiodarone  (PACERONE ) 200 MG tablet Take 1 tablet (200 mg total) by mouth daily. 90 tablet 3 Taking   apixaban  (ELIQUIS ) 5 MG TABS tablet Take 1 tablet (5 mg total) by mouth 2 (two) times daily. 180 tablet 3 Taking   donepezil  (ARICEPT ) 10 MG tablet TAKE ONE TABLET (10 MG) BY MOUTH AT BEDTIME 90 tablet 0 Taking   gabapentin  (NEURONTIN ) 100 MG capsule Take 1 capsule (100 mg total) by mouth at bedtime.   Taking   levothyroxine  (SYNTHROID ) 88 MCG tablet TAKE 1 TABLET EVERY DAY ON EMPTY STOMACHWITH A GLASS OF WATER AT LEAST 30-60 MINBEFORE BREAKFAST 90 tablet 3 Taking   metoprolol  succinate (TOPROL -XL) 25 MG 24 hr tablet Take 0.5 tablets (12.5 mg total) by mouth daily. Take with or immediately following a meal.   Taking   nitroGLYCERIN  (NITROSTAT ) 0.4 MG SL tablet Place 1 tablet (0.4 mg total) under the tongue every 5 (five) minutes as needed for chest pain. 25 tablet 3 Taking As Needed   potassium chloride  SA (KLOR-CON  M) 20 MEQ tablet Take 1 tablet (20 mEq total)  by mouth daily as needed. Take it on the days you take Torsemide  30 tablet 0 Taking As Needed   pravastatin  (PRAVACHOL ) 20 MG tablet Take 1 tablet (20 mg total) by mouth at bedtime. 90 tablet 3 Taking   Semaglutide ,0.25 or 0.5MG /DOS, (OZEMPIC , 0.25 OR 0.5 MG/DOSE,) 2 MG/1.5ML SOPN Inject 0.5 mg into the skin once a week. 6 mL 1 Taking   Tiotropium Bromide Monohydrate  (SPIRIVA  RESPIMAT) 1.25 MCG/ACT AERS Inhale into the lungs.   Taking   torsemide  (DEMADEX ) 20 MG tablet Take 1 tablet (20 mg total) by mouth daily as needed. For leg edema or sob 30 tablet 0 Taking As Needed   venlafaxine  XR (EFFEXOR -XR) 75 MG 24 hr capsule TAKE 1 CAPSULE BY MOUTH ONCE DAILY. 90 capsule 0 Taking    Assessment: Pharmacy consulted to dose heparin  in this 89 year old female admitted with ACS/NSTEMI.  Pt was on Eliquis  5 mg PO BID PTA,  last dose on 5/21 @ 1900. CrCl = 49.3 ml/min   Date/Time aPTT/HL Rate  Comment 5/22 1259 62/-  900 units/hr Slightly subtherapeutic  5/22 2223        74                  1100 units/hr     therapeutic X 1 5/23 0553        89 />1.10       1100 units/hr     therapeutic X 2  5/24 0625 54/>1.10 1050 units/hr Subtherapeutic 5/24 1555 63/ -   1200 units/hr Subtherapeutic 5/25 0138       71/   1.05         1350 units/hr   therapeutic X 1  5/25 0900 aPTT 60 1350 units/hr Subtherapeutic  5/25 1811 aPTT=52 1450 units/hr Subtherapeutic 5/26 0258       81/ 0.99           1700 units/hr   Therapeutic X 1 5/26 1059 65/-  1700 units/hr Subtherapeutic 5/26 2127 131  1800 units/hr SUPRAtherapeutic   Goal of Therapy:  Heparin  level 0.3-0.7 units/ml aPTT 66 - 102 seconds Monitor platelets by anticoagulation protocol: Yes   Plan:  Hold heparin  infusion for one hour, then decrease to 1650 units/hr Check aPTT 8 hours after drip restart Check HL with AM labs Continue to monitor  aPTT and heparin  levels until correlating CBC daily while on heparin  infusion  Will M. Alva Jewels, PharmD Clinical  Pharmacist 05/18/2024 10:11 PM

## 2024-05-18 NOTE — Progress Notes (Signed)
 Progress Note  Patient Name: Alison Richardson Date of Encounter: 05/18/2024  Primary Cardiologist: Jerelene Monday  Subjective   No further chest or left shoulder pain.  Breathing much improved, reports back to baseline.  Supplemental oxygen has been weaned to 2 to 3 L via nasal cannula.  Documented urine output 2.8 L for the past 24 hours, net -6.5 L for the admission.  Weight trend 75.9 to 72.1 kg over the past 24 hours.  Inpatient Medications    Scheduled Meds:  aspirin  EC  81 mg Oral Daily   budesonide  (PULMICORT ) nebulizer solution  0.5 mg Nebulization BID   donepezil   10 mg Oral QHS   feeding supplement  1 Container Oral TID BM   furosemide   40 mg Intravenous BID   insulin  aspart  0-9 Units Subcutaneous TID WC   insulin  glargine-yfgn  10 Units Subcutaneous Daily   ipratropium-albuterol   3 mL Nebulization TID   lactulose  20 g Oral Once   levothyroxine   88 mcg Oral Q0600   multivitamin with minerals  1 tablet Oral Daily   polyethylene glycol  17 g Oral BID   pravastatin   20 mg Oral QHS   predniSONE   20 mg Oral Q breakfast   senna  1 tablet Oral Daily   venlafaxine  XR  75 mg Oral Daily   Continuous Infusions:  heparin  1,700 Units/hr (05/18/24 0643)   PRN Meds: acetaminophen , albuterol , guaiFENesin -dextromethorphan , morphine  injection, ondansetron  (ZOFRAN ) IV, mouth rinse, traZODone    Vital Signs    Vitals:   05/18/24 0300 05/18/24 0505 05/18/24 0700 05/18/24 0757  BP: 136/69  119/64   Pulse: 66  64   Resp:      Temp: 98.2 F (36.8 C)  98.3 F (36.8 C)   TempSrc: Axillary  Oral   SpO2: 98%  (!) 87% 98%  Weight:  72.1 kg    Height:        Intake/Output Summary (Last 24 hours) at 05/18/2024 0943 Last data filed at 05/18/2024 0332 Gross per 24 hour  Intake 480 ml  Output 3300 ml  Net -2820 ml   Filed Weights   05/16/24 0356 05/17/24 0500 05/18/24 0505  Weight: 74.6 kg 75.9 kg 72.1 kg    Telemetry    Sinus rhythm, 70s to 80s bpm - Personally Reviewed  ECG     No new tracings - Personally Reviewed  Physical Exam   GEN: No acute distress.   Neck: No JVD. Cardiac: RRR, no murmurs, rubs, or gallops.  Respiratory: Improving breath sounds bilaterally with faint coarseness.  Supplemental oxygen via nasal cannula at 3 L noted. GI: Soft, nontender, non-distended.   MS: No edema; No deformity. Neuro:  Alert and oriented x 3; Nonfocal.  Psych: Normal affect.  Labs    Chemistry Recent Labs  Lab 05/14/24 0034 05/15/24 0553 05/16/24 0625 05/17/24 0900  NA 139 136 141 139  K 3.5 3.7 4.0 3.3*  CL 107 104 105 100  CO2 19* 22 25 26   GLUCOSE 230* 185* 235* 137*  BUN 13 28* 22 21  CREATININE 0.75 0.90 0.83 0.75  CALCIUM  8.9 7.8* 8.6* 8.6*  PROT 7.2  --   --   --   ALBUMIN 4.0  --   --   --   AST 49*  --   --   --   ALT 39  --   --   --   ALKPHOS 89  --   --   --   BILITOT 1.0  --   --   --  GFRNONAA >60 >60 >60 >60  ANIONGAP 13 10 11 13      Hematology Recent Labs  Lab 05/16/24 0625 05/17/24 0138 05/18/24 0258  WBC 19.2* 17.8* 13.5*  RBC 3.79* 3.62* 3.83*  HGB 10.0* 9.6* 10.2*  HCT 31.6* 29.9* 31.4*  MCV 83.4 82.6 82.0  MCH 26.4 26.5 26.6  MCHC 31.6 32.1 32.5  RDW 15.6* 15.5 15.2  PLT 311 296 313    Cardiac EnzymesNo results for input(s): "TROPONINI" in the last 168 hours. No results for input(s): "TROPIPOC" in the last 168 hours.   BNP Recent Labs  Lab 05/14/24 0034  BNP 290.1*     DDimer  Recent Labs  Lab 05/14/24 0423  DDIMER 1.20*     Radiology    No results found.  Cardiac Studies   2D echo 05/14/2024: 1. Left ventricular ejection fraction, by estimation, is 40 to 45%. The  left ventricle has mild to moderately decreased function. The left  ventricle demonstrates global hypokinesis. Left ventricular diastolic  parameters are indeterminate.   2. Right ventricular systolic function is normal. The right ventricular  size is moderately enlarged. There is moderately elevated pulmonary artery  systolic  pressure. The estimated right ventricular systolic pressure is  54.3 mmHg.   3. The mitral valve is normal in structure. Mild mitral valve  regurgitation.   4. Tricuspid valve regurgitation is mild to moderate.   5. The aortic valve is tricuspid. Aortic valve regurgitation is not  visualized.   6. The inferior vena cava is normal in size with greater than 50%  respiratory variability, suggesting right atrial pressure of 3 mmHg.  __________   2D echo 12/31/2023: 1. Left ventricular ejection fraction, by estimation, is 60 to 65%. The  left ventricle has normal function. The left ventricle has no regional  wall motion abnormalities. Left ventricular diastolic parameters are  indeterminate.   2. Right ventricular systolic function is normal. The right ventricular  size is normal. There is mildly elevated pulmonary artery systolic  pressure. The estimated right ventricular systolic pressure is 40.8 mmHg.   3. Left atrial size was mild to moderately dilated.   4. The mitral valve is normal in structure. Mild to moderate mitral valve  regurgitation. No evidence of mitral stenosis.   5. Tricuspid valve regurgitation is mild to moderate.   6. The aortic valve has an indeterminant number of cusps. Aortic valve  regurgitation is not visualized. No aortic stenosis is present.   7. The inferior vena cava is normal in size with greater than 50%  respiratory variability, suggesting right atrial pressure of 3 mmHg.  __________   2D echo 04/21/2023: 1. Left ventricular ejection fraction, by estimation, is 55 to 60%. Left  ventricular ejection fraction by PLAX is 67 %. The left ventricle has  normal function. The left ventricle has no regional wall motion  abnormalities. Left ventricular diastolic  parameters are indeterminate.   2. Right ventricular systolic function is normal. The right ventricular  size is normal. There is mildly elevated pulmonary artery systolic  pressure. The estimated right  ventricular systolic pressure is 38.3 mmHg.   3. The mitral valve is normal in structure. Moderate mitral valve  regurgitation. No evidence of mitral stenosis.   4. Tricuspid valve regurgitation is moderate to severe.   5. The aortic valve is tricuspid. Aortic valve regurgitation is not  visualized. No aortic stenosis is present.   6. The inferior vena cava is normal in size with greater than 50%  respiratory  variability, suggesting right atrial pressure of 3 mmHg.  __________   2D echo 12/11/2022: 1. Left ventricular ejection fraction, by estimation, is 50 to 55%. The  left ventricle has low normal function. The left ventricle has no regional  wall motion abnormalities. Left ventricular diastolic parameters were  normal.   2. Right ventricular systolic function is normal. The right ventricular  size is normal.   3. The mitral valve is normal in structure. Mild mitral valve  regurgitation. No evidence of mitral stenosis.   4. The aortic valve is normal in structure. Aortic valve regurgitation is  not visualized. No aortic stenosis is present.   5. The inferior vena cava is normal in size with greater than 50%  respiratory variability, suggesting right atrial pressure of 3 mmHg.  __________   2D echo 10/31/2021: 1. Left ventricular ejection fraction, by estimation, is 45 to 50%. The  left ventricle has mildly decreased function. The left ventricle has no  regional wall motion abnormalities. Left ventricular diastolic parameters  were normal.   2. Right ventricular systolic function is normal. The right ventricular  size is normal.   3. The mitral valve is normal in structure. Mild mitral valve  regurgitation. No evidence of mitral stenosis.   4. The aortic valve is normal in structure. Aortic valve regurgitation is  not visualized. No aortic stenosis is present.   5. The inferior vena cava is normal in size with greater than 50%  respiratory variability, suggesting right atrial  pressure of 3 mmHg.  __________   LHC 10/12/2021:   Ost RCA to Prox RCA lesion is 99% stenosed.   Dist RCA lesion is 65% stenosed.   RPDA lesion is 60% stenosed.   1st Diag lesion is 99% stenosed.   Ost Cx to Prox Cx lesion is 25% stenosed.   Ost LAD to Prox LAD lesion is 25% stenosed.   There is mild left ventricular systolic dysfunction.   LV end diastolic pressure is mildly elevated.   The left ventricular ejection fraction is 35-45% by visual estimate.   89 year old female with known chronic nonvalvular atrial fibrillation hypertension hyperlipidemia with acute non-ST elevation myocardial infarction   Cardiac catheterization showing mild inferior hypokinesis and apical hypokinesis with ejection fraction of 40 to 45%   Severe and/or critical stenosis of ostial right coronary artery and moderate atherosclerosis of distal right coronary artery and PDA complex in nature with good collaterals from left anterior descending artery Mild atherosclerosis of left anterior descending artery and circumflex artery   After discussion with cardiovascular team would best be medically manage at this time due to complex lesions and not ideal for PCI and stent placement   Plan Isosorbide  for better collateral flow 2.  Increase beta-blocker for better heart rate control of atrial fibrillation with a goal heart rate between 60 and 90 bpm 3.  Reinstate anticoagulation for further risk reduction and stroke with atrial fibrillation 4.  Single antiplatelet therapy with Plavix  5.  Hypertension control and high intensity cholesterol therapy 6.  Cardiac rehabilitation __________   2D echo 10/07/2020: 1. Left ventricular ejection fraction, by estimation, is 60 to 65%. The  left ventricle has normal function. The left ventricle has no regional  wall motion abnormalities. Left ventricular diastolic parameters are  consistent with Grade I diastolic  dysfunction (impaired relaxation).   2. Right ventricular  systolic function is normal. The right ventricular  size is normal.   3. The mitral valve is grossly normal. Mild mitral valve regurgitation.  4. The aortic valve is normal in structure. Aortic valve regurgitation is  trivial.  __________   2D echo 09/29/2019: 1. Left ventricular ejection fraction, by visual estimation, is 60 to  65%. The left ventricle has normal function. Left ventricular septal wall  thickness was mildly increased. Mildly increased left ventricular  posterior wall thickness. There is mildly  increased left ventricular hypertrophy.   2. Global right ventricle has normal systolic function.The right  ventricular size is mildly enlarged. No increase in right ventricular wall  thickness.   3. Left atrial size was mildly dilated.   4. Right atrial size was normal.   5. The mitral valve is grossly normal. Mild mitral valve regurgitation.   6. The tricuspid valve is grossly normal. Tricuspid valve regurgitation  is mild.   7. The aortic valve is tricuspid Aortic valve regurgitation was not  visualized by color flow Doppler.   8. The pulmonic valve was not well visualized. Pulmonic valve  regurgitation is trivial by color flow Doppler.   9. Moderately elevated pulmonary artery systolic pressure.  10. No shunts by color flow doppler.   Patient Profile     89 y.o. female with history of CAD medically managed, persistent A-fib, HFimpEF, valvular heart disease, DM2, HTN, HLD, and hypothyroidism who is being seen today for the evaluation of HFrEF and elevated troponin at the request of Dr. Vallarie Gauze.   Assessment & Plan    1. Acute hypoxic respiratory failure: - Sudden onset of dyspnea on the evening of 5/21 - Previously required BiPAP upon presentation, now weaned to to supplemental oxygen via nasal cannula at 3 L - Volume status improving - Concern for possible amiodarone  toxicity as well with CT this admission nonspecific with septal thickening and groundglass opacities  possibly related to amiodarone  toxicity versus fluid versus other alternative etiologies, leading to medication discontinuation - Echo with new cardiomyopathy with an EF of 40 to 45% (LV systolic function normal by echo in 12/2023) as well as elevated RVSP estimated at 54 (on BiPAP at that time), and dilated RV - D-dimer elevated at 1.2, consider evaluation for PE given acute hypoxic respiratory failure, dilated RV, and elevated RVSP (however, PE is presumably unlikely as long as she has been adherent to apixaban .  CT of the chest may also help guide ongoing pharmacotherapy, and to evaluate for possible amiodarone  toxicity), will defer to primary service - Degree of hypoxia appears to be out of proportion to heart failure - Continue to wean supplemental oxygen as able - Pulmonology on board - Planning for [facility] on 5/27 - N.p.o. at midnight - DNR rescinded for cardiac cath   2.  CAD involving the native coronary arteries with elevated high-sensitivity troponin: - Known severe CAD from 2022 with medical management recommended at that time  - Episode of chest and left shoulder discomfort over the weekend, now resolved - Troponin peaked at 282, now down trending, possibly demand in etiology in the setting of known severe CAD and presentation with hypoxia - On heparin  drip  - PTA aspirin  and statin - N.p.o. for [facility] on 5/27   3.  Acute on chronic HFrEF with pulmonary hypertension: - EF previously 35 to 45% by LV gram in 09/2021 and 45 to 50% by echo in 10/2021, subsequently normalized -LV systolic function now reduced to 40 to 45% - Volume improving - Continue IV furosemide  40 mg twice daily  - Not currently on beta-blocker (previously bradycardic in the 40s bpm at office visit in 11/2023  leading to decrease of beta-blocker dose to 50 mg at that time) - Escalate GDMT as able, relative hypotension precludes at this time - Cardiac cath planned for 5/27   3.  Persistent A-fib: - Maintaining sinus  rhythm  - Amiodarone  discontinued due to concerns for toxicity/prolonged QT -TSH normal  - Avoid QT prolonging medications - AST mildly elevated this admission with normal ALT, continue with periodic monitoring - CHA2DS2-VASc at least 7 (CHF, HTN, age x 2, DM, vascular disease, sex category) - Last dose of apixaban  on the evening of 5/21 - IV heparin  while admitted until it is clear she will not require invasive testing   4.  Valvular heart disease: - Stable     Informed Consent   Shared Decision Making/Informed Consent{  The risks [stroke (1 in 1000), death (1 in 1000), kidney failure [usually temporary] (1 in 500), bleeding (1 in 200), allergic reaction [possibly serious] (1 in 200)], benefits (diagnostic support and management of coronary artery disease) and alternatives of a cardiac catheterization were discussed in detail with Ms. Willison and she is willing to proceed.      For questions or updates, please contact [facility] Please consult [email or web address] for contact info under Cardiology/STEMI.    Signed, Varney Gentleman, PA-C [facility] Pager: [phone] 05/18/2024, 9:43 AM

## 2024-05-18 NOTE — Progress Notes (Signed)
 PHARMACY - ANTICOAGULATION CONSULT NOTE  Pharmacy Consult for Heparin   Indication: chest pain/ACS  Allergies  Allergen Reactions   Pantoprazole  Nausea And Vomiting   Metformin And Related Diarrhea    Patient Measurements: Height: 5' 5.98" (167.6 cm) Weight: 75.9 kg (167 lb 5.3 oz) IBW/kg (Calculated) : 59.26 HEPARIN  DW (KG): 74.3  Vital Signs: Temp: 98.2 F (36.8 C) (05/26 0300) Temp Source: Axillary (05/26 0300) BP: 136/69 (05/26 0300) Pulse Rate: 66 (05/26 0300)  Labs: Recent Labs    05/15/24 0553 05/16/24 0625 05/16/24 1555 05/17/24 0138 05/17/24 0900 05/17/24 1811 05/18/24 0258  HGB 10.0* 10.0*  --  9.6*  --   --  10.2*  HCT 33.1* 31.6*  --  29.9*  --   --  31.4*  PLT 313 311  --  296  --   --  313  APTT 89* 54*   < > 71* 60* 52* 81*  HEPARINUNFRC >1.10* >1.10*  --  1.05*  --   --  0.99*  CREATININE 0.90 0.83  --   --  0.75  --   --   TROPONINIHS  --  67*  --   --   --   --   --    < > = values in this interval not displayed.    Estimated Creatinine Clearance: 49.6 mL/min (by C-G formula based on SCr of 0.75 mg/dL).   Medical History: Past Medical History:  Diagnosis Date   Acute on chronic congestive heart failure (HCC) 10/07/2020   Arrhythmia    Atrial fibrillation and flutter (HCC) 01/06/2019   Plavix  added to eliquis  10-22 after nstemi       Last Assessment & Plan:    Marked facial hematoma post fall but ct of neck and head reportedly normal. No ha or ataxia now but is using a walker. On eliquis  and plavix      Atrial flutter (HCC) 01/2018   new onset    Basal cell carcinoma of back    Basal cell carcinoma of lip    Cerebral aneurysm    followed by [facility]   CHF (congestive heart failure) (HCC)    DDD (degenerative disc disease), lumbar    superior plate depression, L3 08/18/2014   Diabetes mellitus type II, controlled (HCC)    Diverticulosis    Dysrhythmia    Paroxysmal Supraventricular Tachycardia   Dysthymia    depression   GERD  (gastroesophageal reflux disease)    History of falling 04/03/2022   History of meniscal tear    Humeral surgical neck fracture 05/16/2022   Hyperlipidemia    Hypertension    Hypokalemia 12/12/2022   Hypothyroidism    Late onset Alzheimer's disease with behavioral disturbance (HCC)    Leukocytosis 10/07/2020   Mild pulmonary hypertension (HCC)    Moderate asthma without complication 12/12/2022   Multifocal pneumonia 12/09/2022   Overactive bladder    Peripheral vascular disease (HCC)    Puncture wound of forehead 06/11/2023   Seasonal allergic rhinitis    Severe sepsis (HCC) 02/07/2022   Sleep apnea     Medications:  Medications Prior to Admission  Medication Sig Dispense Refill Last Dose/Taking   albuterol  (PROVENTIL ) (2.5 MG/3ML) 0.083% nebulizer solution Take 3 mLs (2.5 mg total) by nebulization every 6 (six) hours as needed for wheezing or shortness of breath. 75 mL 3 Taking As Needed   amiodarone  (PACERONE ) 200 MG tablet Take 1 tablet (200 mg total) by mouth daily. 90 tablet 3 Taking   apixaban  (ELIQUIS )  5 MG TABS tablet Take 1 tablet (5 mg total) by mouth 2 (two) times daily. 180 tablet 3 Taking   donepezil  (ARICEPT ) 10 MG tablet TAKE ONE TABLET (10 MG) BY MOUTH AT BEDTIME 90 tablet 0 Taking   gabapentin  (NEURONTIN ) 100 MG capsule Take 1 capsule (100 mg total) by mouth at bedtime.   Taking   levothyroxine  (SYNTHROID ) 88 MCG tablet TAKE 1 TABLET EVERY DAY ON EMPTY STOMACHWITH A GLASS OF WATER AT LEAST 30-60 MINBEFORE BREAKFAST 90 tablet 3 Taking   metoprolol  succinate (TOPROL -XL) 25 MG 24 hr tablet Take 0.5 tablets (12.5 mg total) by mouth daily. Take with or immediately following a meal.   Taking   nitroGLYCERIN  (NITROSTAT ) 0.4 MG SL tablet Place 1 tablet (0.4 mg total) under the tongue every 5 (five) minutes as needed for chest pain. 25 tablet 3 Taking As Needed   potassium chloride  SA (KLOR-CON  M) 20 MEQ tablet Take 1 tablet (20 mEq total) by mouth daily as needed. Take it on  the days you take Torsemide  30 tablet 0 Taking As Needed   pravastatin  (PRAVACHOL ) 20 MG tablet Take 1 tablet (20 mg total) by mouth at bedtime. 90 tablet 3 Taking   Semaglutide ,0.25 or 0.5MG /DOS, (OZEMPIC , 0.25 OR 0.5 MG/DOSE,) 2 MG/1.5ML SOPN Inject 0.5 mg into the skin once a week. 6 mL 1 Taking   Tiotropium Bromide Monohydrate  (SPIRIVA  RESPIMAT) 1.25 MCG/ACT AERS Inhale into the lungs.   Taking   torsemide  (DEMADEX ) 20 MG tablet Take 1 tablet (20 mg total) by mouth daily as needed. For leg edema or sob 30 tablet 0 Taking As Needed   venlafaxine  XR (EFFEXOR -XR) 75 MG 24 hr capsule TAKE 1 CAPSULE BY MOUTH ONCE DAILY. 90 capsule 0 Taking    Assessment: Pharmacy consulted to dose heparin  in this 89 year old female admitted with ACS/NSTEMI.  Pt was on Eliquis  5 mg PO BID PTA,  last dose on 5/21 @ 1900. CrCl = 49.3 ml/min   Date/Time aPTT/HL Rate  Comment 5/22 1259 62/-  900 units/hr Slightly subtherapeutic  5/22 2223        74                  1100 units/hr     therapeutic X 1 5/23 0553        89 />1.10       1100 units/hr     therapeutic X 2  5/24 0625 54/>1.10 1050 units/hr Subtherapeutic 5/24 1555 63/ -   1200 units/hr Subtherapeutic 5/25 0138       71/   1.05         1350 units/hr   therapeutic X 1  5/25 0900 aPTT 60 1350 units/hr Subtherapeutic  5/25 1811 aPTT=52 1450 units/hr Subtherapeutic 5/26 0258       81/ 0.99           1700 units/hr   Therapeutic X 1  Goal of Therapy:  Heparin  level 0.3-0.7 units/ml aPTT 66 - 102 seconds Monitor platelets by anticoagulation protocol: Yes   Plan:  5/26 @ 0258:  aPTT = 81,   HL = 0.99 - aPTT therapeutic X 1 ,  HL elevated from PTA Eliquis  - will continue pt on current rate and recheck aPTT in 8 hrs - will recheck HL on 5/27 with AM labs Continue to monitor aPTT and heparin  levels until correlating Monitor CBC and signs/symptoms of bleeding  Shiquita Collignon D 05/18/2024 4:00 AM

## 2024-05-19 ENCOUNTER — Ambulatory Visit: Payer: Self-pay | Admitting: Nurse Practitioner

## 2024-05-19 ENCOUNTER — Encounter
Admission: EM | Disposition: A | Discharge status: Home Health | Payer: Self-pay | Source: Skilled Nursing Facility | Attending: Student in an Organized Health Care Education/Training Program

## 2024-05-19 ENCOUNTER — Encounter: Payer: Self-pay | Admitting: Internal Medicine

## 2024-05-19 DIAGNOSIS — I214 Non-ST elevation (NSTEMI) myocardial infarction: Secondary | ICD-10-CM | POA: Diagnosis not present

## 2024-05-19 DIAGNOSIS — I4819 Other persistent atrial fibrillation: Secondary | ICD-10-CM

## 2024-05-19 DIAGNOSIS — I480 Paroxysmal atrial fibrillation: Secondary | ICD-10-CM | POA: Diagnosis not present

## 2024-05-19 DIAGNOSIS — J9601 Acute respiratory failure with hypoxia: Secondary | ICD-10-CM | POA: Diagnosis not present

## 2024-05-19 DIAGNOSIS — I5023 Acute on chronic systolic (congestive) heart failure: Secondary | ICD-10-CM

## 2024-05-19 DIAGNOSIS — I5043 Acute on chronic combined systolic (congestive) and diastolic (congestive) heart failure: Secondary | ICD-10-CM | POA: Diagnosis not present

## 2024-05-19 HISTORY — PX: RIGHT/LEFT HEART CATH AND CORONARY ANGIOGRAPHY: CATH118266

## 2024-05-19 HISTORY — PX: CORONARY STENT INTERVENTION: CATH118234

## 2024-05-19 LAB — CULTURE, BLOOD (ROUTINE X 2)
Culture: NO GROWTH
Culture: NO GROWTH
Special Requests: ADEQUATE
Special Requests: ADEQUATE

## 2024-05-19 LAB — POCT I-STAT 7, (LYTES, BLD GAS, ICA,H+H)
Acid-Base Excess: 8 mmol/L — ABNORMAL HIGH (ref 0.0–2.0)
Bicarbonate: 32.3 mmol/L — ABNORMAL HIGH (ref 20.0–28.0)
Calcium, Ion: 1.21 mmol/L (ref 1.15–1.40)
HCT: 32 % — ABNORMAL LOW (ref 36.0–46.0)
Hemoglobin: 10.9 g/dL — ABNORMAL LOW (ref 12.0–15.0)
O2 Saturation: 93 %
Potassium: 3.8 mmol/L (ref 3.5–5.1)
Sodium: 136 mmol/L (ref 135–145)
TCO2: 34 mmol/L — ABNORMAL HIGH (ref 22–32)
pCO2 arterial: 43.6 mmHg (ref 32–48)
pH, Arterial: 7.479 — ABNORMAL HIGH (ref 7.35–7.45)
pO2, Arterial: 63 mmHg — ABNORMAL LOW (ref 83–108)

## 2024-05-19 LAB — LIPID PANEL
Cholesterol: 152 mg/dL (ref 0–200)
HDL: 68 mg/dL (ref >40)
LDL Cholesterol: 66 mg/dL (ref 0–99)
Total CHOL/HDL Ratio: 2.2 ratio
Triglycerides: 92 mg/dL (ref <150)
VLDL: 18 mg/dL (ref 0–40)

## 2024-05-19 LAB — CBC
HCT: 34.4 % — ABNORMAL LOW (ref 36.0–46.0)
Hemoglobin: 10.8 g/dL — ABNORMAL LOW (ref 12.0–15.0)
MCH: 25.9 pg — ABNORMAL LOW (ref 26.0–34.0)
MCHC: 31.4 g/dL (ref 30.0–36.0)
MCV: 82.5 fL (ref 80.0–100.0)
Platelets: 300 10*3/uL (ref 150–400)
RBC: 4.17 MIL/uL (ref 3.87–5.11)
RDW: 14.9 % (ref 11.5–15.5)
WBC: 13.8 10*3/uL — ABNORMAL HIGH (ref 4.0–10.5)
nRBC: 0.1 % (ref 0.0–0.2)

## 2024-05-19 LAB — POCT I-STAT EG7
Acid-Base Excess: 8 mmol/L — ABNORMAL HIGH (ref 0.0–2.0)
Bicarbonate: 33 mmol/L — ABNORMAL HIGH (ref 20.0–28.0)
Calcium, Ion: 1.17 mmol/L (ref 1.15–1.40)
HCT: 33 % — ABNORMAL LOW (ref 36.0–46.0)
Hemoglobin: 11.2 g/dL — ABNORMAL LOW (ref 12.0–15.0)
O2 Saturation: 60 %
Potassium: 3.7 mmol/L (ref 3.5–5.1)
Sodium: 134 mmol/L — ABNORMAL LOW (ref 135–145)
TCO2: 34 mmol/L — ABNORMAL HIGH (ref 22–32)
pCO2, Ven: 49.4 mmHg (ref 44–60)
pH, Ven: 7.433 — ABNORMAL HIGH (ref 7.25–7.43)
pO2, Ven: 31 mmHg — CL (ref 32–45)

## 2024-05-19 LAB — BASIC METABOLIC PANEL WITH GFR
Anion gap: 15 (ref 5–15)
BUN: 22 mg/dL (ref 8–23)
CO2: 28 mmol/L (ref 22–32)
Calcium: 9 mg/dL (ref 8.9–10.3)
Chloride: 96 mmol/L — ABNORMAL LOW (ref 98–111)
Creatinine, Ser: 0.64 mg/dL (ref 0.44–1.00)
GFR, Estimated: >60 mL/min (ref >60)
Glucose, Bld: 180 mg/dL — ABNORMAL HIGH (ref 70–99)
Potassium: 3.4 mmol/L — ABNORMAL LOW (ref 3.5–5.1)
Sodium: 139 mmol/L (ref 135–145)

## 2024-05-19 LAB — GLUCOSE, CAPILLARY
Glucose-Capillary: 143 mg/dL — ABNORMAL HIGH (ref 70–99)
Glucose-Capillary: 166 mg/dL — ABNORMAL HIGH (ref 70–99)
Glucose-Capillary: 230 mg/dL — ABNORMAL HIGH (ref 70–99)
Glucose-Capillary: 224 mg/dL — ABNORMAL HIGH (ref 70–99)

## 2024-05-19 LAB — POCT ACTIVATED CLOTTING TIME
Activated Clotting Time: 210 s
Activated Clotting Time: 239 s
Activated Clotting Time: 268 s
Activated Clotting Time: 279 s
Activated Clotting Time: 279 s

## 2024-05-19 LAB — HEPARIN LEVEL (UNFRACTIONATED): Heparin Unfractionated: >1.1 [IU]/mL — ABNORMAL HIGH (ref 0.30–0.70)

## 2024-05-19 LAB — APTT: aPTT: 132 s — ABNORMAL HIGH (ref 24–36)

## 2024-05-19 SURGERY — RIGHT/LEFT HEART CATH AND CORONARY ANGIOGRAPHY
Anesthesia: Moderate Sedation

## 2024-05-19 MED ORDER — ASPIRIN 81 MG PO CHEW: 81.0000 mg | CHEWABLE_TABLET | ORAL | Status: AC

## 2024-05-19 MED ORDER — HEPARIN (PORCINE) IN NACL 1000-0.9 UT/500ML-% IV SOLN
INTRAVENOUS | Status: AC
  Filled 2024-05-19: qty 1000

## 2024-05-19 MED ORDER — CLOPIDOGREL BISULFATE 75 MG PO TABS
75.0000 mg | ORAL_TABLET | Freq: Every day | ORAL | Status: DC
  Administered 2024-05-20: 75 mg via ORAL
  Filled 2024-05-19: qty 1

## 2024-05-19 MED ORDER — FENTANYL CITRATE (PF) 100 MCG/2ML IJ SOLN
INTRAMUSCULAR | Status: DC | PRN
  Administered 2024-05-19: 12.5 ug via INTRAVENOUS

## 2024-05-19 MED ORDER — POTASSIUM CHLORIDE CRYS ER 20 MEQ PO TBCR: 40.0000 meq | EXTENDED_RELEASE_TABLET | Freq: Once | ORAL | Status: DC

## 2024-05-19 MED ORDER — NITROGLYCERIN 1 MG/10 ML FOR IR/CATH LAB
INTRA_ARTERIAL | Status: DC | PRN
  Administered 2024-05-19 (×2): 200 ug via INTRACORONARY

## 2024-05-19 MED ORDER — APIXABAN 5 MG PO TABS
5.0000 mg | ORAL_TABLET | Freq: Two times a day (BID) | ORAL | Status: DC
  Administered 2024-05-19 – 2024-05-20 (×2): 5 mg via ORAL
  Filled 2024-05-19 (×2): qty 1

## 2024-05-19 MED ORDER — LORATADINE 10 MG PO TABS
10.0000 mg | ORAL_TABLET | Freq: Every day | ORAL | Status: DC
  Administered 2024-05-19 – 2024-05-20 (×2): 10 mg via ORAL
  Filled 2024-05-19 (×2): qty 1

## 2024-05-19 MED ORDER — POTASSIUM CHLORIDE CRYS ER 20 MEQ PO TBCR
40.0000 meq | EXTENDED_RELEASE_TABLET | Freq: Two times a day (BID) | ORAL | Status: AC
  Administered 2024-05-19 (×2): 40 meq via ORAL
  Filled 2024-05-19 (×2): qty 2

## 2024-05-19 MED ORDER — LIDOCAINE HCL 1 % IJ SOLN
INTRAMUSCULAR | Status: AC
  Filled 2024-05-19: qty 20

## 2024-05-19 MED ORDER — IOHEXOL 300 MG/ML  SOLN
INTRAMUSCULAR | Status: DC | PRN
Start: 2024-05-19 — End: 2024-05-19
  Administered 2024-05-19: 115 mL

## 2024-05-19 MED ORDER — IPRATROPIUM-ALBUTEROL 0.5-2.5 (3) MG/3ML IN SOLN
3.0000 mL | Freq: Two times a day (BID) | RESPIRATORY_TRACT | Status: DC
  Administered 2024-05-19 – 2024-05-20 (×2): 3 mL via RESPIRATORY_TRACT
  Filled 2024-05-19 (×2): qty 3

## 2024-05-19 MED ORDER — HYDRALAZINE HCL 20 MG/ML IJ SOLN: 10.0000 mg | INTRAMUSCULAR | Status: DC | PRN

## 2024-05-19 MED ORDER — MIDAZOLAM HCL 2 MG/2ML IJ SOLN
INTRAMUSCULAR | Status: AC
  Filled 2024-05-19: qty 2

## 2024-05-19 MED ORDER — SODIUM CHLORIDE 0.9% FLUSH
3.0000 mL | Freq: Two times a day (BID) | INTRAVENOUS | Status: DC
  Administered 2024-05-19 – 2024-05-20 (×2): 3 mL via INTRAVENOUS

## 2024-05-19 MED ORDER — HEPARIN SODIUM (PORCINE) 1000 UNIT/ML IJ SOLN
INTRAMUSCULAR | Status: AC
  Filled 2024-05-19: qty 10

## 2024-05-19 MED ORDER — GLYCERIN (LAXATIVE) 2 G RE SUPP
1.0000 | Freq: Every day | RECTAL | Status: DC | PRN
  Administered 2024-05-20: 1 via RECTAL
  Filled 2024-05-19 (×2): qty 1

## 2024-05-19 MED ORDER — MIDAZOLAM HCL 2 MG/2ML IJ SOLN
INTRAMUSCULAR | Status: DC | PRN
Start: 2024-05-19 — End: 2024-05-19
  Administered 2024-05-19: .5 mg via INTRAVENOUS

## 2024-05-19 MED ORDER — VERAPAMIL HCL 2.5 MG/ML IV SOLN
INTRAVENOUS | Status: DC | PRN
Start: 2024-05-19 — End: 2024-05-19
  Administered 2024-05-19 (×2): 2.5 mg via INTRA_ARTERIAL

## 2024-05-19 MED ORDER — FENTANYL CITRATE (PF) 100 MCG/2ML IJ SOLN
INTRAMUSCULAR | Status: AC
  Filled 2024-05-19: qty 2

## 2024-05-19 MED ORDER — SODIUM CHLORIDE 0.9% FLUSH
3.0000 mL | INTRAVENOUS | Status: DC | PRN
Start: 2024-05-19 — End: 2024-05-20

## 2024-05-19 MED ORDER — LIDOCAINE HCL (PF) 1 % IJ SOLN
INTRAMUSCULAR | Status: DC | PRN
Start: 2024-05-19 — End: 2024-05-19
  Administered 2024-05-19 (×2): 2 mL

## 2024-05-19 MED ORDER — NITROGLYCERIN 1 MG/10 ML FOR IR/CATH LAB
INTRA_ARTERIAL | Status: AC
  Filled 2024-05-19: qty 10

## 2024-05-19 MED ORDER — CLOPIDOGREL BISULFATE 75 MG PO TABS
ORAL_TABLET | ORAL | Status: DC | PRN
Start: 2024-05-19 — End: 2024-05-19
  Administered 2024-05-19: 600 mg via ORAL

## 2024-05-19 MED ORDER — VERAPAMIL HCL 2.5 MG/ML IV SOLN
INTRAVENOUS | Status: AC
  Filled 2024-05-19: qty 2

## 2024-05-19 MED ORDER — SMOG ENEMA: 960.0000 mL | Freq: Once | RECTAL | Status: DC | PRN

## 2024-05-19 MED ORDER — FAMOTIDINE 20 MG PO TABS
20.0000 mg | ORAL_TABLET | Freq: Two times a day (BID) | ORAL | Status: DC
  Administered 2024-05-19 – 2024-05-20 (×2): 20 mg via ORAL
  Filled 2024-05-19 (×2): qty 1

## 2024-05-19 MED ORDER — CLOPIDOGREL BISULFATE 75 MG PO TABS
ORAL_TABLET | ORAL | Status: AC
  Filled 2024-05-19: qty 8

## 2024-05-19 MED ORDER — HEPARIN SODIUM (PORCINE) 1000 UNIT/ML IJ SOLN
INTRAMUSCULAR | Status: DC | PRN
  Administered 2024-05-19: 3000 [IU] via INTRAVENOUS
  Administered 2024-05-19: 3000 [IU] via INTRA_ARTERIAL
  Administered 2024-05-19 (×2): 3500 [IU] via INTRAVENOUS
  Administered 2024-05-19 (×2): 2000 [IU] via INTRA_ARTERIAL

## 2024-05-19 MED ORDER — SODIUM CHLORIDE 0.9 % IV SOLN: 250.0000 mL | INTRAVENOUS | Status: AC | PRN

## 2024-05-19 MED ORDER — LABETALOL HCL 5 MG/ML IV SOLN: 10.0000 mg | INTRAVENOUS | Status: DC | PRN

## 2024-05-19 MED ORDER — SODIUM CHLORIDE 0.9 % IV SOLN: INTRAVENOUS | Status: DC

## 2024-05-19 SURGICAL SUPPLY — 21 items
BALLOON MINITREK RX 2.0X8 (BALLOONS) IMPLANT
BALLOON TREK RX 2.5X12 (BALLOONS) IMPLANT
BALLOON ~~LOC~~ TREK NEO RX 3.0X12 (BALLOONS) IMPLANT
BALLOON ~~LOC~~ TREK NEO RX 3.5X12 (BALLOONS) IMPLANT
CATH BALLN WEDGE 5F 110CM (CATHETERS) IMPLANT
CATH INFINITI 5 FR JL3.5 (CATHETERS) IMPLANT
CATH INFINITI 5FR JL4 (CATHETERS) IMPLANT
CATH INFINITI JR4 5F (CATHETERS) IMPLANT
CATH LAUNCHER 6FR JR4 (CATHETERS) IMPLANT
DEVICE RAD TR BAND REGULAR (VASCULAR PRODUCTS) IMPLANT
DRAPE BRACHIAL (DRAPES) IMPLANT
GLIDESHEATH SLEND SS 6F .021 (SHEATH) IMPLANT
GUIDEWIRE INQWIRE 1.5J.035X260 (WIRE) IMPLANT
KIT ENCORE 26 ADVANTAGE (KITS) IMPLANT
PACK CARDIAC CATH (CUSTOM PROCEDURE TRAY) ×1 IMPLANT
SET ATX-X65L (MISCELLANEOUS) IMPLANT
SHEATH GLIDE SLENDER 4/5FR (SHEATH) IMPLANT
STATION PROTECTION PRESSURIZED (MISCELLANEOUS) IMPLANT
STENT ONYX FRONTIER 2.75X15 (Permanent Stent) IMPLANT
STENT ONYX FRONTIER 3.0X15 (Permanent Stent) IMPLANT
WIRE RUNTHROUGH IZANAI 014 180 (WIRE) IMPLANT

## 2024-05-19 NOTE — Progress Notes (Signed)
 OT Cancellation Note  Patient Details Name: Alison Richardson MRN: [id number] DOB: Jul 20, 1933   Cancelled Treatment:    Reason Eval/Treat Not Completed: Other (comment) (per chart, pt is OTF for cardiac cath; OT will re attempt as able.Gerre Kraft, OTD OTR/L  05/19/24, 1:19 PM

## 2024-05-19 NOTE — Interval H&P Note (Signed)
 History and Physical Interval Note:  05/19/2024 9:43 AM  Alison Richardson  has presented today for surgery, with the diagnosis of NSTEMI and HFmrEF.  The various methods of treatment have been discussed with the patient and family. After consideration of risks, benefits and other options for treatment, the patient has consented to  Procedure(s): RIGHT/LEFT HEART CATH AND CORONARY ANGIOGRAPHY (N/A) as a surgical intervention.  The patient's history has been reviewed, patient examined, no change in status, stable for surgery.  I have reviewed the patient's chart and labs.  Questions were answered to the patient's satisfaction.  She has agreed to rescind her DNR order and will be full code for the duration of the procedure.  Cath Lab Visit (complete for each Cath Lab visit)  Clinical Evaluation Leading to the Procedure:   ACS: Yes.    Non-ACS:  N/A  Amit Meloy

## 2024-05-19 NOTE — Progress Notes (Signed)
 IVT to bedside to place PIV for R&L heart cath. Charting reflecting 20 g present in LFA not present upon assessment. Pt with 18G in R thumb infusing heparin  (not reflected in chart). Pt reporting site is very painful. PIV started x 2, 18g removed. Site is red and painful to touch.

## 2024-05-19 NOTE — Plan of Care (Addendum)
 PMT following. Patient is currently off unit preparing for cardiac cath. PMT will follow.   2:37: Patient remains off unit following cardiac cath.

## 2024-05-19 NOTE — Progress Notes (Signed)
 PT Cancellation Note  Patient Details Name: Alison Richardson MRN: [id number] DOB: 1933/02/12   Cancelled Treatment:    Reason Eval/Treat Not Completed: Patient at procedure or test/unavailable Patient off unit at cardiac cath this AM. Will re-attempt at later date/time as appropriate.   Janine Melbourne, PT, DPT Physical Therapist - [facility]  [facility]   Ryllie Nieland A Maurio Baize 05/19/2024, 8:52 AM

## 2024-05-19 NOTE — Progress Notes (Signed)
 PHARMACY - ANTICOAGULATION CONSULT NOTE  Pharmacy Consult for Heparin   Indication: chest pain/ACS  Allergies  Allergen Reactions   Pantoprazole  Nausea And Vomiting   Metformin And Related Diarrhea    Patient Measurements: Height: 5' 5.98" (167.6 cm) Weight: 72.6 kg (160 lb 0.9 oz) IBW/kg (Calculated) : 59.26 HEPARIN  DW (KG): 74.3  Vital Signs: Temp: 97.9 F (36.6 C) (05/27 0758) BP: 119/57 (05/27 0758) Pulse Rate: 70 (05/27 0758)  Labs: Recent Labs    05/17/24 0138 05/17/24 0900 05/17/24 1811 05/18/24 0258 05/18/24 1059 05/18/24 2127 05/19/24 0323 05/19/24 0752  HGB 9.6*  --   --  10.2*  --   --  10.8*  --   HCT 29.9*  --   --  31.4*  --   --  34.4*  --   PLT 296  --   --  313  --   --  300  --   APTT 71* 60*   < > 81* 65* 131*  --  132*  HEPARINUNFRC 1.05*  --   --  0.99*  --   --  >1.10*  --   CREATININE  --  0.75  --   --   --   --  0.64  --    < > = values in this interval not displayed.    Estimated Creatinine Clearance: 48.6 mL/min (by C-G formula based on SCr of 0.64 mg/dL).   Medical History: Past Medical History:  Diagnosis Date   Acute on chronic congestive heart failure (HCC) 10/07/2020   Arrhythmia    Atrial fibrillation and flutter (HCC) 01/06/2019   Plavix  added to eliquis  10-22 after nstemi       Last Assessment & Plan:    Marked facial hematoma post fall but ct of neck and head reportedly normal. No ha or ataxia now but is using a walker. On eliquis  and plavix      Atrial flutter (HCC) 01/2018   new onset    Basal cell carcinoma of back    Basal cell carcinoma of lip    Cerebral aneurysm    followed by [facility]   CHF (congestive heart failure) (HCC)    DDD (degenerative disc disease), lumbar    superior plate depression, L3 08/18/2014   Diabetes mellitus type II, controlled (HCC)    Diverticulosis    Dysrhythmia    Paroxysmal Supraventricular Tachycardia   Dysthymia    depression   GERD (gastroesophageal reflux disease)    History of  falling 04/03/2022   History of meniscal tear    Humeral surgical neck fracture 05/16/2022   Hyperlipidemia    Hypertension    Hypokalemia 12/12/2022   Hypothyroidism    Late onset Alzheimer's disease with behavioral disturbance (HCC)    Leukocytosis 10/07/2020   Mild pulmonary hypertension (HCC)    Moderate asthma without complication 12/12/2022   Multifocal pneumonia 12/09/2022   Overactive bladder    Peripheral vascular disease (HCC)    Puncture wound of forehead 06/11/2023   Seasonal allergic rhinitis    Severe sepsis (HCC) 02/07/2022   Sleep apnea     Medications:  Medications Prior to Admission  Medication Sig Dispense Refill Last Dose/Taking   albuterol  (PROVENTIL ) (2.5 MG/3ML) 0.083% nebulizer solution Take 3 mLs (2.5 mg total) by nebulization every 6 (six) hours as needed for wheezing or shortness of breath. 75 mL 3 Taking As Needed   amiodarone  (PACERONE ) 200 MG tablet Take 1 tablet (200 mg total) by mouth daily. 90 tablet 3 Taking  apixaban  (ELIQUIS ) 5 MG TABS tablet Take 1 tablet (5 mg total) by mouth 2 (two) times daily. 180 tablet 3 Taking   donepezil  (ARICEPT ) 10 MG tablet TAKE ONE TABLET (10 MG) BY MOUTH AT BEDTIME 90 tablet 0 Taking   gabapentin  (NEURONTIN ) 100 MG capsule Take 1 capsule (100 mg total) by mouth at bedtime.   Taking   levothyroxine  (SYNTHROID ) 88 MCG tablet TAKE 1 TABLET EVERY DAY ON EMPTY STOMACHWITH A GLASS OF WATER AT LEAST 30-60 MINBEFORE BREAKFAST 90 tablet 3 Taking   metoprolol  succinate (TOPROL -XL) 25 MG 24 hr tablet Take 0.5 tablets (12.5 mg total) by mouth daily. Take with or immediately following a meal.   Taking   nitroGLYCERIN  (NITROSTAT ) 0.4 MG SL tablet Place 1 tablet (0.4 mg total) under the tongue every 5 (five) minutes as needed for chest pain. 25 tablet 3 Taking As Needed   potassium chloride  SA (KLOR-CON  M) 20 MEQ tablet Take 1 tablet (20 mEq total) by mouth daily as needed. Take it on the days you take Torsemide  30 tablet 0 Taking As  Needed   pravastatin  (PRAVACHOL ) 20 MG tablet Take 1 tablet (20 mg total) by mouth at bedtime. 90 tablet 3 Taking   Semaglutide ,0.25 or 0.5MG /DOS, (OZEMPIC , 0.25 OR 0.5 MG/DOSE,) 2 MG/1.5ML SOPN Inject 0.5 mg into the skin once a week. 6 mL 1 Taking   Tiotropium Bromide Monohydrate  (SPIRIVA  RESPIMAT) 1.25 MCG/ACT AERS Inhale into the lungs.   Taking   torsemide  (DEMADEX ) 20 MG tablet Take 1 tablet (20 mg total) by mouth daily as needed. For leg edema or sob 30 tablet 0 Taking As Needed   venlafaxine  XR (EFFEXOR -XR) 75 MG 24 hr capsule TAKE 1 CAPSULE BY MOUTH ONCE DAILY. 90 capsule 0 Taking    Assessment: Pharmacy consulted to dose heparin  in this 89 year old female admitted with ACS/NSTEMI.  Pt was on Eliquis  5 mg PO BID PTA,  last dose on 5/21 @ 1900. CrCl = 49.3 ml/min   Date/Time aPTT/HL Rate  Comment 5/22 1259 62/-  900 units/hr Slightly subtherapeutic  5/25 1811 aPTT=52 1450 units/hr Subtherapeutic 5/26 0258       81/ 0.99           1700 units/hr   Therapeutic X 1 5/26 1059 65/-  1700 units/hr Subtherapeutic 5/26 2127 131  1800 units/hr SUPRAtherapeutic 5/27 0752 132/>1.10 1650 units/hr SUPRAtherapeutic   Goal of Therapy:  Heparin  level 0.3-0.7 units/ml aPTT 66 - 102 seconds Monitor platelets by anticoagulation protocol: Yes   Plan:  Hold heparin  infusion for one hour. Will re-assess after cath if heparin  infusion needed.  Takelia Urieta Rodriguez-Guzman PharmD, BCPS 05/19/2024 8:52 AM

## 2024-05-19 NOTE — Progress Notes (Signed)
 PROGRESS NOTE Alison Richardson    DOB: May 07, 1933, 89 y.o.  ZOX:[id number]    Code Status: Limited: Do not attempt resuscitation (DNR) -DNR-LIMITED -Do Not Intubate/DNI    DOA: 05/14/2024   LOS: 5   Brief hospital course  Alison Richardson is a 89 y.o. female with medical history significant for dementia, HTN, DM, CHF, atrial fibrillation on Eliquis  and amiodarone , PAD, recently hospitalized from 5/16 to 5/17 with hypotension that improved with medication adjustment, who presented to the ED with shortness of breath for which she received DuoNebs and Solu-Medrol  en route.    ED course: Tachypneic to the low 30s with elevated blood pressure of 153/105, hypoxic to 87% on 4 L when she arrived, subsequently transitioned to HFNC and then BiPAP Labs notable for: VBG on BiPAP FiO2 60% with pH 7.36 and PCO244. WBC of 24,000 with lactic acid 2.8 and negative respiratory viral panel. Troponin 14--108 with BNP 290. Lipase and LFTs unremarkable  EKG: sinus at 70 with no acute ST-T wave changes Chest x-ray mild to moderate cardiogenic failure  Remains chest pain free and on heparin  gtt while awaiting ischemic evaluation/cath tentatively planned for 5/27. Cardiology consulted.  Respiratory status remains precarious on high O2 requirement but she is gradually improving. Being treated with steroids, antibiotics, breathing treatments. Pulmonology consulted.   Encouraging OOB if tolerated.   5/27: cath with PCI of 2 DES to RCA was tolerated well by patient. She has weaned to 2Lnc. She is chest pain free post-procedure. PT/OT recommends HH. Will continue to diurese and ambulate and try to wean O2 prior to dc. Possible TEE, per cards.   Assessment & Plan  Principal Problem:   Acute on chronic combined systolic and diastolic CHF (congestive heart failure) (HCC) Active Problems:   NSTEMI (non-ST elevated myocardial infarction) (HCC)   CAD (coronary artery disease)   Acute respiratory failure with hypoxia (HCC)    Hypothyroidism   Leukocytosis   Paroxysmal atrial fibrillation (HCC)   Depression with anxiety   Cognitive impairment, mild, so stated   PAD (peripheral artery disease) (HCC)   DM type 2 (diabetes mellitus, type 2) (HCC)   Lactic acidosis  Acute on chronic diastolic CHF- Signs of pulmonary edema on imaging. Had echo January 2025 with EF 60 to 65%. Repeat echo this admission showed EF 40-45%, global hypokinesis, moderately enlarged RV, moderately elevated pulmonary artery pressure (while on bipap). Mild-mod TV regurg. - management per cardiology   - "If dyspnea persists, may need to consider repeat echo or even TEE given concern for significant mitral valve regurgitation with prominent V waves on right heart catheterization (only mild MR reported on echo this admission)."  - Continue IV Lasix  - strict I/O - weaned off nitroglycerin  gtt - Hypotension precludes from further GDMT at this time. - If BP and HR will tolerate, initiate BB Daily weights with intake and output monitoring - OOB and ambulate as tolerated   NSTEMI  CAD- Troponin 14>108, without acute EKG changes or complaints of chest pain.  - On heparin  infusion prior to PCI and now transitioned to eliquis , aspirin , plus plavix  for one week after which aspirin  can be discontinued. Received 2 DES to RCA on 5/27. Tolerated well.   - Continue pravastatin       Acute respiratory failure with hypoxia- no baseline O2 requirement. Weaned from bipap. Echo shows significant increase in pulmonary pressures but may have been completed while on bipap so would interfere with results. Pulmonology consulted to review.  - breathing treatments -  steroids  - diuresis as above - wean O2 as tolerated - incentive spirometry  - palliative consulted - consulting pulmonology, Dr. Meredeth Stallion  Constipation- still no BM. Abdomen is soft and non-tender. Patient is asymptomatic. Had a few morphine  doses since admission. bowel regimen increased - PRN enema     Paroxysmal atrial fibrillation (HCC) - restarted apixaban  - discontinued amiodarone  and metoprolol  in setting of low BP/HR    Hypothyroidism Continue levothyroxine   Depression with anxiety Continue venlafaxine    DM type 2 (diabetes mellitus, type 2) (HCC) Sliding scale insulin  coverage - continue semglee  as CBGs increased on steroid use   PAD (peripheral artery disease) (HCC) Continue pravastatin  Currently on aspirin    Cognitive impairment, mild, so stated Delirium precautions  Body mass index is 25.85 kg/m.  VTE ppx: heparin   Diet:     Diet   Diet NPO time specified Except for: Sips with Meds   Consultants: Cardiology  Pulmonology   Subjective 05/19/24    Pt reports feeling a little sleepy after the procedure but otherwise well resting in bed. Denies dyspnea or CP.   Objective  Blood pressure (!) 148/89, pulse 65, temperature 97.9 F (36.6 C), temperature source Oral, resp. rate (!) 22, SpO2 96%.  Intake/Output Summary (Last 24 hours) at 05/19/2024 0741 Last data filed at 05/19/2024 0455 Gross per 24 hour  Intake 1112.86 ml  Output 2200 ml  Net -1087.14 ml   Filed Weights   05/17/24 0500 05/18/24 0505 05/19/24 0508  Weight: 75.9 kg 72.1 kg 72.6 kg   Physical Exam Vitals and nursing note reviewed.  Constitutional:      General: She is not in acute distress.    Appearance: She is not ill-appearing.  HENT:     Head: Normocephalic.     Mouth/Throat:     Mouth: Mucous membranes are moist.  Cardiovascular:     Rate and Rhythm: Normal rate and regular rhythm.  Pulmonary:     Effort: Pulmonary effort is normal. No tachypnea, accessory muscle usage or respiratory distress.     Breath sounds: Decreased air movement present. Rhonchi present. No decreased breath sounds.  Musculoskeletal:     Right lower leg: No edema.     Left lower leg: No edema.  Skin:    General: Skin is warm and dry.  Neurological:     General: No focal deficit present.     Mental  Status: She is alert.  Psychiatric:        Mood and Affect: Mood normal.   Labs   I have personally reviewed the following labs and imaging studies CBC    Component Value Date/Time   WBC 13.8 (H) 05/19/2024 0323   RBC 4.17 05/19/2024 0323   HGB 10.8 (L) 05/19/2024 0323   HGB 11.2 04/13/2024 1357   HCT 34.4 (L) 05/19/2024 0323   HCT 35.7 04/13/2024 1357   PLT 300 05/19/2024 0323   PLT 419 04/13/2024 1357   MCV 82.5 05/19/2024 0323   MCV 85 04/13/2024 1357   MCH 25.9 (L) 05/19/2024 0323   MCHC 31.4 05/19/2024 0323   RDW 14.9 05/19/2024 0323   RDW 14.6 04/13/2024 1357   LYMPHSABS 4.5 (H) 05/14/2024 0034   LYMPHSABS 2.5 04/13/2024 1357   MONOABS 1.4 (H) 05/14/2024 0034   EOSABS 0.3 05/14/2024 0034   EOSABS 0.2 04/13/2024 1357   BASOSABS 0.1 05/14/2024 0034   BASOSABS 0.1 04/13/2024 1357      Latest Ref Rng & Units 05/19/2024    3:23 AM  05/17/2024    9:00 AM 05/16/2024    6:25 AM  BMP  Glucose 70 - 99 mg/dL 034  742  595   BUN 8 - 23 mg/dL 22  21  22    Creatinine 0.44 - 1.00 mg/dL 6.38  7.56  4.33   Sodium 135 - 145 mmol/L 139  139  141   Potassium 3.5 - 5.1 mmol/L 3.4  3.3  4.0   Chloride 98 - 111 mmol/L 96  100  105   CO2 22 - 32 mmol/L 28  26  25    Calcium  8.9 - 10.3 mg/dL 9.0  8.6  8.6     No results found.   Disposition Plan & Communication  Patient status: Inpatient  Admitted From: Home Planned disposition location: HHPT Anticipated discharge date: TBD pending clinical course  Family Communication: none at bedside    Author: Ree Candy, DO [facility] 05/19/2024, 7:41 AM   Available by Epic secure chat 7AM-7PM. If 7PM-7AM, please contact night-coverage.  [facility] contact information found on [email or web address].

## 2024-05-19 NOTE — Plan of Care (Signed)

## 2024-05-19 NOTE — Progress Notes (Addendum)
 Patient Name: Alison Richardson Date of Encounter: 05/19/2024 [facility] Cardiologist: Belva Boyden, MD   Interval Summary  .    Patient still noted some tightness in the chest this morning.  She underwent PCI with 2 nonoverlapping drug-eluting stents placed to the RCA.  Chest tightness has improved and is now only present when she takes a very deep breath.  She denies shortness of breath, palpitations, lightheadedness, and edema.  Vital Signs .    Vitals:   05/19/24 1315 05/19/24 1330 05/19/24 1345 05/19/24 1400  BP: 114/61 111/65 105/60 110/62  Pulse: 63 70 66 69  Resp: 13 20 19 20   Temp:      TempSrc:      SpO2: 95% 94% 95% 96%  Weight:      Height:        Intake/Output Summary (Last 24 hours) at 05/19/2024 1433 Last data filed at 05/19/2024 0800 Gross per 24 hour  Intake 329.09 ml  Output 1200 ml  Net -870.91 ml      05/19/2024    8:51 AM 05/19/2024    5:08 AM 05/18/2024    5:05 AM  Last 3 Weights  Weight (lbs) 160 lb 0.9 oz 160 lb 0.9 oz 158 lb 15.2 oz  Weight (kg) 72.6 kg 72.6 kg 72.1 kg      Telemetry/ECG    Sinus rhythm with PACs - Personally Reviewed  Physical Exam .   GEN: No acute distress.   Neck: No JVD Cardiac: RRR, no murmurs, rubs, or gallops.  Respiratory: Clear to auscultation bilaterally. GI: Soft, nontender, non-distended  MS: No edema  Assessment & Plan .     Acute respiratory failure with hypoxia: Likely multifactorial including an element of HFpEF and COPD.  Also some concern for amiodarone  toxicity (Amio has now been discontinued) though rapid improvement with heart failure/COPD therapy argues against this.  Right and left heart catheterization today with normal left and right heart filling pressures. -Maintain net even to slightly negative fluid balance. -Wean steroids as tolerated. -Continue to hold amiodarone .  NSTEMI: The patient initially presented primarily with shortness of breath but had some chest and left shoulder  pain over the weekend with mildly elevated high-sensitivity troponin I noted on admission.  Echo shows mildly-moderately reduced LVEF.  Catheterization today showed fairly stable coronary anatomy with CTO of small high diagonal branch and severe proximal and distal RCA with antegrade and collateral flow.  Patient underwent successful PCI to proximal and distal RCA and notes that chest tightness has actually improved. -Anticipate triple therapy with aspirin , apixaban , and clopidogrel  for 1 week, after which time aspirin  can be stopped.  If tolerated, continue apixaban  and clopidogrel  for 12 months. -Concurrent famotidine  therapy while on apixaban  and clopidogrel  (+/- aspirin ), given previous intolerance of pantoprazole . - Continue secondary prevention with pravastatin  20 mg nightly as LDL is at goal.  Given advanced age and concern for side effects, we will not switch to a high intensity statin.  Acute on chronic heart failure with mildly reduced ejection fraction: Dyspnea and hypoxia continued to improve, with Ms. Whisenant having been weaned down to 2 L of oxygen this morning.  Right and left heart catheterization showed normal left and right heart filling pressures and mild pulmonary hypertension.  Cardiac output/index also normal. -Maintain net even to slightly negative fluid balance. -Defer adding beta-blocker for now in the setting of borderline low resting heart rate and COPD exacerbation. -Consider adding low-dose ARB tomorrow if renal function and blood pressure  allow. - If dyspnea persists, may need to consider repeat echo or even TEE given concern for significant mitral valve regurgitation with prominent V waves on right heart catheterization (only mild MR reported on echo this admission).  Persistent atrial fibrillation and QT prolongation: Ms. Safran is maintaining sinus rhythm with amiodarone  having been held earlier this admission in the setting of QT prolongation and concern for possible  amiodarone  lung toxicity. -Continue holding amiodarone . -Avoid QT prolonging medications. -If no evidence of bleeding or vascular injury at catheterization sites, anticipate resumption of apixaban  5 mg twice daily this evening.  For questions or updates, please contact [facility] Please consult [email or web address] for contact info under [facility].  Signed, Sammy Crisp, MD

## 2024-05-19 NOTE — Progress Notes (Signed)
 Dr. Nolan Battle came by now & saw pt. In recovery area. Pt. Able to converse with MD and states her chest only hurts "a bit" when she takes a big deep breath (as she points to her mid/right chest).

## 2024-05-20 ENCOUNTER — Other Ambulatory Visit: Payer: Self-pay | Admitting: Family

## 2024-05-20 ENCOUNTER — Encounter: Payer: Self-pay | Admitting: Internal Medicine

## 2024-05-20 ENCOUNTER — Other Ambulatory Visit: Payer: Self-pay

## 2024-05-20 DIAGNOSIS — I5043 Acute on chronic combined systolic (congestive) and diastolic (congestive) heart failure: Secondary | ICD-10-CM | POA: Diagnosis not present

## 2024-05-20 DIAGNOSIS — F418 Other specified anxiety disorders: Secondary | ICD-10-CM

## 2024-05-20 DIAGNOSIS — I2510 Atherosclerotic heart disease of native coronary artery without angina pectoris: Secondary | ICD-10-CM | POA: Diagnosis not present

## 2024-05-20 DIAGNOSIS — G3184 Mild cognitive impairment, so stated: Secondary | ICD-10-CM

## 2024-05-20 DIAGNOSIS — J9601 Acute respiratory failure with hypoxia: Secondary | ICD-10-CM | POA: Diagnosis not present

## 2024-05-20 DIAGNOSIS — E039 Hypothyroidism, unspecified: Secondary | ICD-10-CM | POA: Diagnosis not present

## 2024-05-20 DIAGNOSIS — E1122 Type 2 diabetes mellitus with diabetic chronic kidney disease: Secondary | ICD-10-CM

## 2024-05-20 LAB — GLUCOSE, CAPILLARY
Glucose-Capillary: 126 mg/dL — ABNORMAL HIGH (ref 70–99)
Glucose-Capillary: 209 mg/dL — ABNORMAL HIGH (ref 70–99)

## 2024-05-20 LAB — BASIC METABOLIC PANEL WITH GFR
Anion gap: 10 (ref 5–15)
BUN: 20 mg/dL (ref 8–23)
CO2: 28 mmol/L (ref 22–32)
Calcium: 8.8 mg/dL — ABNORMAL LOW (ref 8.9–10.3)
Chloride: 101 mmol/L (ref 98–111)
Creatinine, Ser: 0.67 mg/dL (ref 0.44–1.00)
GFR, Estimated: >60 mL/min (ref >60)
Glucose, Bld: 115 mg/dL — ABNORMAL HIGH (ref 70–99)
Potassium: 4.9 mmol/L (ref 3.5–5.1)
Sodium: 139 mmol/L (ref 135–145)

## 2024-05-20 LAB — CBC
HCT: 33 % — ABNORMAL LOW (ref 36.0–46.0)
Hemoglobin: 10.4 g/dL — ABNORMAL LOW (ref 12.0–15.0)
MCH: 26.1 pg (ref 26.0–34.0)
MCHC: 31.5 g/dL (ref 30.0–36.0)
MCV: 82.7 fL (ref 80.0–100.0)
Platelets: 313 10*3/uL (ref 150–400)
RBC: 3.99 MIL/uL (ref 3.87–5.11)
RDW: 15.3 % (ref 11.5–15.5)
WBC: 12.9 10*3/uL — ABNORMAL HIGH (ref 4.0–10.5)
nRBC: 0 % (ref 0.0–0.2)

## 2024-05-20 MED ORDER — LOSARTAN POTASSIUM 25 MG PO TABS
12.5000 mg | ORAL_TABLET | Freq: Every day | ORAL | Status: DC
  Administered 2024-05-20: 12.5 mg via ORAL
  Filled 2024-05-20: qty 1

## 2024-05-20 MED ORDER — ASPIRIN 81 MG PO TBEC
81.0000 mg | DELAYED_RELEASE_TABLET | Freq: Every day | ORAL | 0 refills | Status: AC
  Filled 2024-05-20: qty 7, 7d supply, fill #0

## 2024-05-20 MED ORDER — FAMOTIDINE 20 MG PO TABS
20.0000 mg | ORAL_TABLET | Freq: Two times a day (BID) | ORAL | 0 refills | Status: DC
  Filled 2024-05-20: qty 60, 30d supply, fill #0

## 2024-05-20 MED ORDER — LOSARTAN POTASSIUM 25 MG PO TABS
12.5000 mg | ORAL_TABLET | Freq: Every day | ORAL | 0 refills | Status: DC
  Filled 2024-05-20: qty 30, 60d supply, fill #0

## 2024-05-20 MED ORDER — CLOPIDOGREL BISULFATE 75 MG PO TABS
75.0000 mg | ORAL_TABLET | Freq: Every day | ORAL | 0 refills | Status: DC
  Filled 2024-05-20: qty 90, 90d supply, fill #0

## 2024-05-20 MED ORDER — LACTULOSE 10 GM/15ML PO SOLN
20.0000 g | Freq: Two times a day (BID) | ORAL | Status: DC
  Filled 2024-05-20: qty 30

## 2024-05-20 NOTE — Progress Notes (Addendum)
 Daily Progress Note   Patient Name: Alison Richardson       Date: 05/20/2024 DOB: [birth year]  Age: 89 y.o. MRN#: [id number] Attending Physician: Roise Cleaver, DO Primary Care Physician: Hadassah Letters, MD Admit Date: 05/14/2024  Reason for Consultation/Follow-up: Establishing goals of care  Subjective: Notes, cath results and labs independently reviewed.  In to see patient.  She is currently sitting in bedside chair at this time.  No distress noted.  No family at bedside.  She discusses that she has been walking around the nurses station without shortness of breath.  She is happy that she is off oxygen.  He states she is so happy to have completed the cardiac catheterization and is pleased with outcomes.  Questions answered for disease processes.  Continue to treat the treatment at this time.  She discusses that her son is in town for few days, and she is ready to go home if she is ready to be discharged.  Attending updated on conversation  Length of Stay: 6  Current Medications: Scheduled Meds:   apixaban   5 mg Oral BID   aspirin  EC  81 mg Oral Daily   budesonide  (PULMICORT ) nebulizer solution  0.5 mg Nebulization BID   clopidogrel   75 mg Oral Q breakfast   donepezil   10 mg Oral QHS   famotidine   20 mg Oral BID AC   feeding supplement  237 mL Oral BID BM   gabapentin   100 mg Oral QHS   insulin  aspart  0-9 Units Subcutaneous TID WC   insulin  glargine-yfgn  10 Units Subcutaneous Daily   ipratropium-albuterol   3 mL Nebulization BID   lactulose  20 g Oral Once   lactulose  20 g Oral BID   levothyroxine   88 mcg Oral Q0600   loratadine   10 mg Oral Daily   losartan  12.5 mg Oral Daily   multivitamin with minerals  1 tablet Oral Daily   polyethylene glycol  17 g Oral BID    pravastatin   20 mg Oral QHS   senna  1 tablet Oral Daily   sodium chloride  flush  3 mL Intravenous Q12H   venlafaxine  XR  75 mg Oral Daily    Continuous Infusions:  sodium chloride       PRN Meds: sodium chloride , acetaminophen , albuterol , Glycerin (Adult), guaiFENesin -dextromethorphan , morphine  injection,  mouth rinse, sodium chloride  flush, SMOG, traZODone   Physical Exam Pulmonary:     Effort: Pulmonary effort is normal.  Neurological:     Mental Status: She is alert.             Vital Signs: BP 121/61 (BP Location: Left Arm)   Pulse 71   Temp 98.4 F (36.9 C)   Resp 18   Ht 5\' 6"  (1.676 m)   Wt 72.5 kg   SpO2 98%   BMI 25.80 kg/m  SpO2: SpO2: 98 % O2 Device: O2 Device: Room Air O2 Flow Rate: O2 Flow Rate (L/min): 2 L/min  Intake/output summary:  Intake/Output Summary (Last 24 hours) at 05/20/2024 1531 Last data filed at 05/20/2024 1421 Gross per 24 hour  Intake 840 ml  Output 700 ml  Net 140 ml   LBM: Last BM Date : 05/17/24 (per pt) Baseline Weight: Weight: 74.8 kg Most recent weight: Weight: 72.5 kg   Patient Active Problem List   Diagnosis Date Noted   DM type 2 (diabetes mellitus, type 2) (HCC) 05/14/2024   Lactic acidosis 05/14/2024   Paroxysmal atrial fibrillation (HCC) 03/26/2024   Lumbar radiculopathy 02/06/2023   Depression with anxiety 12/09/2022   CAD (coronary artery disease) 12/09/2022   History of non-ST elevation myocardial infarction (NSTEMI) 04/03/2022   History of cerebral aneurysm 04/03/2022   Gastroesophageal reflux disease 02/12/2022   DDD (degenerative disc disease), lumbar    Acute respiratory failure with hypoxia (HCC) 10/31/2021   Acute on chronic combined systolic and diastolic CHF (congestive heart failure) (HCC) 10/31/2021   NSTEMI (non-ST elevated myocardial infarction) (HCC) 10/10/2021   Chronic diastolic CHF (congestive heart failure) (HCC) 10/13/2020   Leukocytosis 10/07/2020   Cor pulmonale, chronic (HCC) 09/27/2019    Late onset Alzheimer's disease without behavioral disturbance (HCC) 09/23/2019   Obstructive sleep apnea 07/09/2019   Type 2 diabetes mellitus with peripheral neuropathy (HCC) 01/21/2019   PSVT (paroxysmal supraventricular tachycardia) (HCC) 01/21/2019   PAD (peripheral artery disease) (HCC) 01/06/2019   Hyperlipidemia associated with type 2 diabetes mellitus (HCC) 09/08/2018   Anxiety disorder 06/19/2018   Depression, major, single episode, mild (HCC) 06/19/2018   Vitamin B12 deficiency 06/19/2018   Insomnia 06/19/2018   Protein-calorie malnutrition (HCC) 06/19/2018   Cognitive impairment, mild, so stated 06/19/2018   Hypertension associated with diabetes (HCC) 06/19/2018   Hypothyroidism 06/19/2018   Overactive bladder 06/19/2018   Primary osteoarthritis involving multiple joints 06/18/2018    Palliative Care Assessment & Plan    Recommendations/Plan: Patient states she is feeling much better and is ready for discharge.  PMT will sign off  Code Status:    Code Status Orders  (From admission, onward)           Start     Ordered   05/19/24 1236  Do not attempt resuscitation (DNR)- Limited -Do Not Intubate (DNI)  (Code Status)  Continuous       Question Answer Comment  If pulseless and not breathing No CPR or chest compressions.   In Pre-Arrest Conditions (Patient Is Breathing and Has A Pulse) Do not intubate. Provide all appropriate non-invasive medical interventions. Avoid ICU transfer unless indicated or required.   Consent: Discussion documented in EHR or advanced directives reviewed      05/19/24 1235           Code Status History     Date Active Date Inactive Code Status Order ID Comments User Context   05/19/2024 1015 05/19/2024 1235 Full Code [id number]  Sammy Crisp, MD Inpatient   05/14/2024 [id number] 05/19/2024 1015 Limited: Do not attempt resuscitation (DNR) -DNR-LIMITED -Do Not Intubate/DNI  [id number]  Lanetta Pion, MD ED   05/14/2024 0139 05/14/2024 0511  Limited: Do not attempt resuscitation (DNR) -DNR-LIMITED -Do Not Intubate/DNI  [id number]  Ward, Clover Dao, DO ED   05/08/2024 1442 05/09/2024 1959 Limited: Do not attempt resuscitation (DNR) -DNR-LIMITED -Do Not Intubate/DNI  [id number]  Cox, Amy N, DO ED   03/26/2024 0618 03/30/2024 1736 Limited: Do not attempt resuscitation (DNR) -DNR-LIMITED -Do Not Intubate/DNI  [id number]  Achilles Holes, Anastasio Kaska, MD ED   03/26/2024 0515 03/26/2024 0617 Full Code [id number]  Mansy, Anastasio Kaska, MD ED   04/20/2023 1225 04/21/2023 1807 Full Code [id number]  Corrinne Din, MD ED   12/09/2022 0801 12/12/2022 2213 DNR [id number]  Fidencio Hue, MD ED   05/15/2022 2348 05/17/2022 1712 DNR [id number]  Cox, Amy Neale Bale, DO ED   01/25/2022 1754 01/30/2022 1649 DNR [id number]  Janeane Mealy, MD ED   10/31/2021 1130 11/02/2021 2132 DNR [id number]  Fidencio Hue, MD ED   10/13/2021 1047 10/17/2021 2224 DNR [id number]  Alphonsus Jeans, MD Inpatient   10/12/2021 1444 10/13/2021 1046 Full Code [id number]  Michelle Aid, MD Inpatient   10/07/2020 0207 10/07/2020 2037 DNR [id number]  Lanetta Pion, MD ED   05/09/2020 2050 05/10/2020 1857 DNR [id number]  Ephriam Hashimoto, MD ED   09/29/2019 1157 10/01/2019 1755 DNR [id number]  Alexis Anger, MD ED      Advance Directive Documentation    Flowsheet Row Most Recent Value  Type of Advance Directive Out of facility DNR (pink MOST or yellow form)  Pre-existing out of facility DNR order (yellow form or pink MOST form) --  "MOST" Form in Place? --     Thank you for allowing the Palliative Medicine Team to assist in the care of this patient.  Meribeth Standard, NP  Please contact Palliative Medicine Team phone at [phone] for questions and concerns.

## 2024-05-20 NOTE — Progress Notes (Signed)
 Heart Failure Nurse Navigator Progress Note  PCP: Hadassah Letters, MD PCP-Cardiologist: Belva Boyden, MD Admission Diagnosis: Acute respiratory failure with hypoxia (HCC) Acute on chronic congestive heart failure, unspecified heart failure type [facility]) NSTEMI (non-ST elevated myocardial infarction) [facility])  Admitted from: [place] via [place]  Presentation:   Alison Richardson presented with sudden onset of shortness of breath and left-sided dull chest pain. Hypoxic on room air and does not wear oxygen chronically. Patient was diaphoretic but denied fever.  She received 2 DuoNebs and Solo-Medrol  with EMS and reported feeling better after treatments. Hx: Dementia, COPD, CHF, A-Fib on Eliquis ,  Pulmonary hypertension, Hypertension, Diabetes, Hyperlipidemia. BNP 290. HS-Troponin 14. Chest x-ray: Mild to moderate cardiogenic failure-05/14/24   ECHO/ LVEF: 40-45% -05/14/24 LV has mild to moderately decreased function and demonstrated global hypokinesis.  The right ventricular size is moderately enlarged.  Clinical Course:  Past Medical History:  Diagnosis Date   Acute on chronic congestive heart failure (HCC) 10/07/2020   Arrhythmia    Atrial fibrillation and flutter (HCC) 01/06/2019   Plavix  added to eliquis  10-22 after nstemi       Last Assessment & Plan:    Marked facial hematoma post fall but ct of neck and head reportedly normal. No ha or ataxia now but is using a walker. On eliquis  and plavix      Atrial flutter (HCC) 01/2018   new onset    Basal cell carcinoma of back    Basal cell carcinoma of lip    Cerebral aneurysm    followed by [facility]   CHF (congestive heart failure) (HCC)    DDD (degenerative disc disease), lumbar    superior plate depression, L3 08/18/2014   Diabetes mellitus type II, controlled (HCC)    Diverticulosis    Dysrhythmia    Paroxysmal Supraventricular Tachycardia   Dysthymia    depression   GERD (gastroesophageal reflux disease)    History of falling  04/03/2022   History of meniscal tear    Humeral surgical neck fracture 05/16/2022   Hyperlipidemia    Hypertension    Hypokalemia 12/12/2022   Hypothyroidism    Late onset Alzheimer's disease with behavioral disturbance (HCC)    Leukocytosis 10/07/2020   Mild pulmonary hypertension (HCC)    Moderate asthma without complication 12/12/2022   Multifocal pneumonia 12/09/2022   Overactive bladder    Peripheral vascular disease (HCC)    Puncture wound of forehead 06/11/2023   Seasonal allergic rhinitis    Severe sepsis (HCC) 02/07/2022   Sleep apnea      Social History   Socioeconomic History   Marital status: Widowed    Spouse name: Not on file   Number of children: 5   Years of education: Not on file   Highest education level: Not on file  Occupational History   Not on file  Tobacco Use   Smoking status: Never   Smokeless tobacco: Never  Vaping Use   Vaping status: Never Used  Substance and Sexual Activity   Alcohol use: Not Currently   Drug use: Never   Sexual activity: Not Currently  Other Topics Concern   Not on file  Social History Narrative   Not on file   Social Drivers of Health   Financial Resource Strain: Low Risk  (04/09/2023)   Overall Financial Resource Strain (CARDIA)    Difficulty of Paying Living Expenses: Not hard at all  Food Insecurity: No Food Insecurity (05/14/2024)   Hunger Vital Sign    Worried About  Running Out of Food in the Last Year: Never true    Ran Out of Food in the Last Year: Never true  Transportation Needs: No Transportation Needs (05/14/2024)   PRAPARE - Administrator, Civil Service (Medical): No    Lack of Transportation (Non-Medical): No  Physical Activity: Insufficiently Active (04/09/2023)   Exercise Vital Sign    Days of Exercise per Week: 4 days    Minutes of Exercise per Session: 20 min  Stress: No Stress Concern Present (04/09/2023)   [employer] of Occupational Health - Occupational Stress Questionnaire     Feeling of Stress : Not at all  Social Connections: Socially Isolated (05/14/2024)   Social Connection and Isolation Panel [NHANES]    Frequency of Communication with Friends and Family: More than three times a week    Frequency of Social Gatherings with Friends and Family: More than three times a week    Attends Religious Services: Never    Database administrator or Organizations: No    Attends Banker Meetings: Never    Marital Status: Widowed   Education Assessment and Provision:  Detailed education and instructions provided on heart failure disease management including the following:  Signs and symptoms of Heart Failure When to call the physician Importance of daily weights Low sodium diet Fluid restriction Medication management Anticipated future follow-up appointments  Patient education given on each of the above topics.  Patient acknowledges understanding via teach back method and acceptance of all instructions.  Education Materials:  "Living Better With Heart Failure" Booklet, HF zone tool, & Daily Weight Tracker Tool.  Patient has scale at home: Yes Patient has pill box at home: Yes    High Risk Criteria for Readmission and/or Poor Patient Outcomes: Heart failure hospital admissions (last 6 months): 1  No Show rate: 13% Difficult social situation: [id number] (Was very appropriate,responsive and knowledgeable when I interviewed and educated her on Heart Failure) Demonstrates medication adherence: Yes.  Patient has a pill administration routine that works for her. Primary Language: English Literacy level: Reading, Writing, & Comprehension  Barriers of Care:   None  Considerations/Referrals:   Referral made to Heart Failure Pharmacist Stewardship: Yes Referral made to Heart Failure CSW/NCM TOC: No Referral made to Heart & Vascular TOC clinic: Yes-05/28/2024 @ 1:30 PM  Items for Follow-up on DC/TOC: Diet & Fluid Restrictions Daily Weights New  Medication Education Continued Heart Failure Education  Celedonio Coil, RN, BSN [facility] Heart Failure Navigator Secure Chat Only

## 2024-05-20 NOTE — Plan of Care (Signed)
  Problem: Cardiac: Goal: Ability to achieve and maintain adequate cardiovascular perfusion will improve Outcome: Progressing   Problem: Education: Goal: Knowledge of General Education information will improve Description: Including pain rating scale, medication(s)/side effects and non-pharmacologic comfort measures Outcome: Progressing   Problem: Health Behavior/Discharge Planning: Goal: Ability to manage health-related needs will improve Outcome: Progressing   Problem: Clinical Measurements: Goal: Ability to maintain clinical measurements within normal limits will improve Outcome: Progressing Goal: Will remain free from infection Outcome: Progressing Goal: Cardiovascular complication will be avoided Outcome: Progressing   Problem: Elimination: Goal: Will not experience complications related to bowel motility Outcome: Progressing

## 2024-05-20 NOTE — Discharge Instructions (Signed)

## 2024-05-20 NOTE — Progress Notes (Signed)
 Occupational Therapy Treatment Patient Details Name: Alison Richardson MRN: [id number] DOB: 06/12/1933 Today's Date: 05/20/2024   History of present illness Pt is an 89 y/o female admitted secondary to SOB and found to have elevated troponin. Cardiology was consulted and pt found to have a NSTEMI with planned cardiac cath on 5/27. PMH including but not limited to significant dementia, CAD medically managed, persistent A-fib, HFimpEF, valvular heart disease, DM2, HTN, HLD, and hypothyroidism.s/p R/LHC Successful PCI to proximal and distal RCA stenoses    OT comments  Chart reviewed to date, pt greeted in chair, agreeable to OT tx session targeting improving functional activity tolerance in prep for ADL tasks. Pt reports she is performing ADL close to baseline but with increased time and effort. Please see details below, pt amb with MIN A via HHA to toilet, MOD A for toileting, amb approx 200' with MIN A via HHA. Pt uses rollator at home, confirmed with cardiology PA she is ok to do so after discharge. Pt is making progress towarsd goals, discharge recommendation remains appropriate.       If plan is discharge home, recommend the following:  A little help with walking and/or transfers;A little help with bathing/dressing/bathroom;Assistance with cooking/housework;Supervision due to cognitive status;Direct supervision/assist for financial management;Direct supervision/assist for medications management   Equipment Recommendations  None recommended by OT    Recommendations for Other Services      Precautions / Restrictions Precautions Precautions: Fall Recall of Precautions/Restrictions: Intact Restrictions Weight Bearing Restrictions Per Provider Order: No       Mobility Bed Mobility               General bed mobility comments: NT in recliner pre/post session    Transfers Overall transfer level: Needs assistance Equipment used: 1 person hand held assist Transfers: Sit to/from  Stand Sit to Stand: Contact guard assist                 Balance Overall balance assessment: Needs assistance Sitting-balance support: Feet supported Sitting balance-Leahy Scale: Good     Standing balance support: Single extremity supported Standing balance-Leahy Scale: Fair                             ADL either performed or assessed with clinical judgement   ADL Overall ADL's : Needs assistance/impaired Eating/Feeding: Sitting;Set up   Grooming: Set up;Sitting                   Toilet Transfer: Minimal assistance;Ambulation;Grab Paramedic Details (indicate cue type and reason): via HHA with L hand Toileting- Clothing Manipulation and Hygiene: Moderate assistance;Sit to/from stand Toileting - Clothing Manipulation Details (indicate cue type and reason): BM on toilet for thoroughness     Functional mobility during ADLs: Minimal assistance (approx 200' with HHA L hand)      Extremity/Trunk Assessment              Vision       Perception     Praxis     Communication Communication Communication: No apparent difficulties   Cognition Arousal: Alert Behavior During Therapy: WFL for tasks assessed/performed Cognition: History of cognitive impairments                               Following commands: Intact        Cueing   Cueing Techniques: Verbal cues  Exercises  Other Exercises Other Exercises: educated pt re: WBing restrictions in RUE- ok'd to use rollator per cardiology    Shoulder Instructions       General Comments HR 94 bpm after mobility, spo2 >90% on RA throughout    Pertinent Vitals/ Pain       Pain Assessment Pain Assessment: No/denies pain  Home Living                                          Prior Functioning/Environment              Frequency  Min 2X/week        Progress Toward Goals  OT Goals(current goals can now be found in the care plan  section)  Progress towards OT goals: Progressing toward goals  Acute Rehab OT Goals Time For Goal Achievement: 05/31/24  Plan      Co-evaluation                 AM-PAC OT "6 Clicks" Daily Activity     Outcome Measure   Help from another person eating meals?: None Help from another person taking care of personal grooming?: None Help from another person toileting, which includes using toliet, bedpan, or urinal?: A Little Help from another person bathing (including washing, rinsing, drying)?: A Little Help from another person to put on and taking off regular upper body clothing?: None Help from another person to put on and taking off regular lower body clothing?: A Little 6 Click Score: 21    End of Session    OT Visit Diagnosis: Muscle weakness (generalized) (M62.81);Repeated falls (R29.6);History of falling (Z91.81);Unsteadiness on feet (R26.81)   Activity Tolerance Patient tolerated treatment well   Patient Left in chair;with call bell/phone within reach;with chair alarm set   Nurse Communication Mobility status        Time: 1610-9604 OT Time Calculation (min): 16 min  Charges: OT General Charges $OT Visit: 1 Visit OT Treatments $Therapeutic Activity: 8-22 mins  Gerre Kraft, OTD OTR/L  05/20/24, 2:57 PM

## 2024-05-20 NOTE — Progress Notes (Signed)
 Rounding Note    Patient Name: Alison Richardson Date of Encounter: 05/20/2024  [facility] Cardiologist: Belva Boyden, MD   Subjective   Patient is feeling well today without chest pain and shortness of breath. She was able to walk with physical therapy this morning without drop in SpO2 and not on supplemental O2. BP stable. She is maintaining sinus rhythm.   Inpatient Medications    Scheduled Meds:  apixaban   5 mg Oral BID   aspirin  EC  81 mg Oral Daily   budesonide  (PULMICORT ) nebulizer solution  0.5 mg Nebulization BID   clopidogrel   75 mg Oral Q breakfast   donepezil   10 mg Oral QHS   famotidine   20 mg Oral BID AC   feeding supplement  237 mL Oral BID BM   gabapentin   100 mg Oral QHS   insulin  aspart  0-9 Units Subcutaneous TID WC   insulin  glargine-yfgn  10 Units Subcutaneous Daily   ipratropium-albuterol   3 mL Nebulization BID   lactulose  20 g Oral Once   levothyroxine   88 mcg Oral Q0600   loratadine   10 mg Oral Daily   multivitamin with minerals  1 tablet Oral Daily   polyethylene glycol  17 g Oral BID   pravastatin   20 mg Oral QHS   senna  1 tablet Oral Daily   sodium chloride  flush  3 mL Intravenous Q12H   venlafaxine  XR  75 mg Oral Daily   Continuous Infusions:  sodium chloride      PRN Meds: sodium chloride , acetaminophen , albuterol , Glycerin (Adult), guaiFENesin -dextromethorphan , morphine  injection, mouth rinse, sodium chloride  flush, SMOG, traZODone    Vital Signs    Vitals:   05/20/24 0305 05/20/24 0500 05/20/24 0735 05/20/24 0739  BP: 114/60  118/66   Pulse: 68  65   Resp: 18  18   Temp: 97.9 F (36.6 C)  98.6 F (37 C)   TempSrc:      SpO2: 98%  96% 95%  Weight:  72.5 kg    Height:        Intake/Output Summary (Last 24 hours) at 05/20/2024 0956 Last data filed at 05/20/2024 0550 Gross per 24 hour  Intake 776.67 ml  Output 850 ml  Net -73.33 ml      05/20/2024    5:00 AM 05/19/2024    8:51 AM 05/19/2024    5:08 AM  Last 3  Weights  Weight (lbs) 159 lb 13.3 oz 160 lb 0.9 oz 160 lb 0.9 oz  Weight (kg) 72.5 kg 72.6 kg 72.6 kg      Telemetry    Sinus rhythm - Personally Reviewed  Physical Exam   GEN: No acute distress.   Neck: No JVD Cardiac: RRR, no murmurs, rubs, or gallops.  Respiratory: Clear to auscultation bilaterally. GI: Soft, nontender, non-distended  MS: No edema; No deformity. Neuro:  Nonfocal  Psych: Normal affect   Labs    High Sensitivity Troponin:   Recent Labs  Lab 05/08/24 1422 05/14/24 0034 05/14/24 0207 05/14/24 1259 05/16/24 0625  TROPONINIHS 7 14 108* 282* 67*     Chemistry Recent Labs  Lab 05/14/24 0034 05/15/24 0553 05/16/24 0625 05/17/24 0900 05/19/24 0323 05/19/24 1043 05/19/24 1048 05/20/24 0444  NA 139   < > 141 139 139 134* 136 139  K 3.5   < > 4.0 3.3* 3.4* 3.7 3.8 4.9  CL 107   < > 105 100 96*  --   --  101  CO2 19*   < >  25 26 28   --   --  28  GLUCOSE 230*   < > 235* 137* 180*  --   --  115*  BUN 13   < > 22 21 22   --   --  20  CREATININE 0.75   < > 0.83 0.75 0.64  --   --  0.67  CALCIUM  8.9   < > 8.6* 8.6* 9.0  --   --  8.8*  MG  --   --  2.2  --   --   --   --   --   PROT 7.2  --   --   --   --   --   --   --   ALBUMIN 4.0  --   --   --   --   --   --   --   AST 49*  --   --   --   --   --   --   --   ALT 39  --   --   --   --   --   --   --   ALKPHOS 89  --   --   --   --   --   --   --   BILITOT 1.0  --   --   --   --   --   --   --   GFRNONAA >60   < > >60 >60 >60  --   --  >60  ANIONGAP 13   < > 11 13 15   --   --  10   < > = values in this interval not displayed.    Lipids  Recent Labs  Lab 05/19/24 0324  CHOL 152  TRIG 92  HDL 68  LDLCALC 66  CHOLHDL 2.2    Hematology Recent Labs  Lab 05/18/24 0258 05/19/24 0323 05/19/24 1043 05/19/24 1048 05/20/24 0444  WBC 13.5* 13.8*  --   --  12.9*  RBC 3.83* 4.17  --   --  3.99  HGB 10.2* 10.8* 11.2* 10.9* 10.4*  HCT 31.4* 34.4* 33.0* 32.0* 33.0*  MCV 82.0 82.5  --   --  82.7   MCH 26.6 25.9*  --   --  26.1  MCHC 32.5 31.4  --   --  31.5  RDW 15.2 14.9  --   --  15.3  PLT 313 300  --   --  313   Thyroid   Recent Labs  Lab 05/14/24 1259  TSH 2.034    BNP Recent Labs  Lab 05/14/24 0034  BNP 290.1*    DDimer  Recent Labs  Lab 05/14/24 0423  DDIMER 1.20*     Cardiac Studies   05/19/2024 [facility] Relatively stable appearance of severe two-vessel CAD, including chronic total occlusion of D1 branch and sequential 99% proximal, 40-50% mid, and 70-80% distal RCA stenoses.  There are also moderate, nonobstructive lesions in the ostial/proximal LAD, proximal/mid LCx, and RPDA/RPAV branches. Normal left and right heart filling pressures (mean RA 4, RV 40/5, PCWP 15, LVEDP 15 mmHg).  Prominent V waves noted on PCWP tracing suggest significant mitral regurgitation. Mild pulmonary hypertension (PA 36/15, mean 22 mmHg). Normal Fick cardiac output/index (CO 4.9 L/min, CI 2.7 L/min/m). Successful PCI to proximal and distal RCA stenoses using nonoverlapping Onyx Frontier 3.0 x 15 mm (proximal) and 2.75 x 15 mm (distal) drug-eluting stents with 0% residual stenosis and TIMI-3  05/14/2024 Echo complete  1. Left ventricular ejection fraction, by estimation, is 40 to 45%. The  left ventricle has mild to moderately decreased function. The left  ventricle demonstrates global hypokinesis. Left ventricular diastolic  parameters are indeterminate.   2. Right ventricular systolic function is normal. The right ventricular  size is moderately enlarged. There is moderately elevated pulmonary artery  systolic pressure. The estimated right ventricular systolic pressure is  54.3 mmHg.   3. The mitral valve is normal in structure. Mild mitral valve  regurgitation.   4. Tricuspid valve regurgitation is mild to moderate.   5. The aortic valve is tricuspid. Aortic valve regurgitation is not  visualized.   6. The inferior vena cava is normal in size with greater than 50%  respiratory  variability, suggesting right atrial pressure of 3 mmHg.   Patient Profile     89 y.o. female CAD medically managed, persistent A-fib, HFimpEF, valvular heart disease, DM2, HTN, HLD, and hypothyroidism who is being seen for the continued evaluation of acute on chronic HFmrEF and NSTEMI.   Assessment & Plan   Acute hypoxic respiratory failure - Likely multifactorial including an element of HFpEF and COPD - Concern for amiodarone  toxicity.  Amiodarone  has been discontinued.  Rapid improvement with heart failure/COPD therapies argues against this. - Right and left heart catheterization today with normal left and right heart filling pressures - Recommend maintaining even to slightly negative fluid balance - Continue to hold amiodarone  - No longer requiring supplemental O2 - Ongoing management per IM  NSTEMI - Initially presented primarily with shortness of breath with some chest and left shoulder pain over the weekend with mildly elevated high-sensitivity troponin noted on admission - Echo this admission with EF 40-45% - Cardiac catheterization done yesterday showed fairly stable coronary anatomy with CTO of small high diagonal branch and severe proximal and distal RCA with antegrade and collateral flow.  Patient underwent successful PCI to proximal and distal RCA. - Cath site is stable  - Continue triple therapy with aspirin , apixaban , and clopidogrel  for 1 week, after which time aspirin  can be stopped.  Recommend continuation of apixaban  and clopidogrel  for at least 12 months as tolerated. - LDL at goal, continue pravastatin  20 mg nightly.  Given advanced age and concern for side effects, will opt not to switch to high intensity statin.  Acute on chronic heart failure with midly reduced ejection fraction - Echo this admission with EF 40-45% - Right and left heart catheterization showed normal left and right heart filling pressures and mild pulmonary hypertension with normal cardiac  output/index - Recommend net even to slightly negative fluid balance as above - No longer requiring supplemental O2 - Will defer addition of beta-blocker in the setting of borderline low resting heart rate and COPD exacerbation - Will start losartan 12.5 mg daily - Consider addition of spironolactone  as outpatient  Persistent atrial fibrillation QT prolongation - Amiodarone  held earlier in admission due to QT prolongation and concern for possible amiodarone  lung toxicity - Patient is maintaining sinus rhythm - Continue to hold amiodarone  and avoid QT prolonging medications - Continue apixaban  5 mg twice daily  For questions or updates, please contact [facility] Please consult [email or web address] for contact info under        Signed, Brodie Cannon, PA-C  05/20/2024, 9:56 AM

## 2024-05-20 NOTE — TOC Initial Note (Addendum)
 Transition of Care [facility]) - Initial/Assessment Note    Patient Details  Name: Alison Richardson MRN: [id number] Date of Birth: 12/15/1933  [facility]) CM/SW Contact:    Odilia Bennett, LCSW Phone Number: 05/20/2024, 12:03 PM  Clinical Narrative:  Readmission prevention screen complete. CSW met with patient. Son and son-in-law at bedside. CSW introduced role and explained that discharge planning would be discussed. PCP is Geraldine Kling, MD. [facility] transports her to appointments. She uses [facility]. No issues obtaining medications. Patient lives alone at [facility]. She was receiving home health PT prior to admission and is agreeable to adding OT. [facility] uses [employer]. She has a rollator and shower chair. She stated she needs a replacement rollator and had obtained it at a thrift store. CSW asked attending physician to enter DME order. No further concerns. CSW will continue to follow patient for support and facilitate return home once stable. Son and son-in-law will transport at discharge.        1:27 pm: Ordered rollator through [employer].        Expected Discharge Plan: Home w Home Health Services Barriers to Discharge: Continued Medical Work up   Patient Goals and CMS Choice     Choice offered to / list presented to : NA      Expected Discharge Plan and Services     Post Acute Care Choice: Resumption of Svcs/PTA Provider Living arrangements for the past 2 months: Independent Living Facility                           HH Arranged: PT, OT          Prior Living Arrangements/Services Living arrangements for the past 2 months: Independent Living Facility Lives with:: Self Patient language and need for interpreter reviewed:: Yes Do you feel safe going back to the place where you live?: Yes      Need for Family Participation in Patient Care: Yes (Comment) Care giver support system in place?: Yes  (comment) Current home services: DME, Home PT Criminal Activity/Legal Involvement Pertinent to Current Situation/Hospitalization: No - Comment as needed  Activities of Daily Living   ADL Screening (condition at time of admission) Independently performs ADLs?: Yes (appropriate for developmental age) Is the patient deaf or have difficulty hearing?: No Does the patient have difficulty seeing, even when wearing glasses/contacts?: No Does the patient have difficulty concentrating, remembering, or making decisions?: No  Permission Sought/Granted Permission sought to share information with : Facility Medical sales representative, Family Supports Permission granted to share information with : Yes, Verbal Permission Granted  Share Information with NAME: Kyarra Vancamp  Permission granted to share info w AGENCY: [facility] granted to share info w Relationship: Son  Permission granted to share info w Contact Information: [phone]  Emotional Assessment Appearance:: Appears stated age Attitude/Demeanor/Rapport: Engaged, Gracious Affect (typically observed): Accepting, Appropriate, Calm, Pleasant Orientation: : Oriented to Self, Oriented to Place, Oriented to  Time, Oriented to Situation Alcohol / Substance Use: Not Applicable Psych Involvement: No (comment)  Admission diagnosis:  NSTEMI (non-ST elevated myocardial infarction) (HCC) [I21.4] Acute exacerbation of CHF (congestive heart failure) (HCC) [I50.9] Acute respiratory failure with hypoxia (HCC) [J96.01] Acute on chronic congestive heart failure, unspecified heart failure type [facility]) [I50.9] Patient Active Problem List   Diagnosis Date Noted   DM type 2 (diabetes mellitus, type 2) (HCC) 05/14/2024   Lactic acidosis 05/14/2024  Paroxysmal atrial fibrillation (HCC) 03/26/2024   Lumbar radiculopathy 02/06/2023   Depression with anxiety 12/09/2022   CAD (coronary artery disease) 12/09/2022   History of non-ST elevation  myocardial infarction (NSTEMI) 04/03/2022   History of cerebral aneurysm 04/03/2022   Gastroesophageal reflux disease 02/12/2022   DDD (degenerative disc disease), lumbar    Acute respiratory failure with hypoxia (HCC) 10/31/2021   Acute on chronic combined systolic and diastolic CHF (congestive heart failure) (HCC) 10/31/2021   NSTEMI (non-ST elevated myocardial infarction) (HCC) 10/10/2021   Chronic diastolic CHF (congestive heart failure) (HCC) 10/13/2020   Leukocytosis 10/07/2020   Cor pulmonale, chronic (HCC) 09/27/2019   Late onset Alzheimer's disease without behavioral disturbance (HCC) 09/23/2019   Obstructive sleep apnea 07/09/2019   Type 2 diabetes mellitus with peripheral neuropathy (HCC) 01/21/2019   PSVT (paroxysmal supraventricular tachycardia) (HCC) 01/21/2019   PAD (peripheral artery disease) (HCC) 01/06/2019   Hyperlipidemia associated with type 2 diabetes mellitus (HCC) 09/08/2018   Anxiety disorder 06/19/2018   Depression, major, single episode, mild (HCC) 06/19/2018   Vitamin B12 deficiency 06/19/2018   Insomnia 06/19/2018   Protein-calorie malnutrition (HCC) 06/19/2018   Cognitive impairment, mild, so stated 06/19/2018   Hypertension associated with diabetes (HCC) 06/19/2018   Hypothyroidism 06/19/2018   Overactive bladder 06/19/2018   Primary osteoarthritis involving multiple joints 06/18/2018   PCP:  Hadassah Letters, MD Pharmacy:   [facility] - [place], [place] - [address] [place] [address] Phone: [phone] Fax: [phone]  [facility] [address] [place] [address] Phone: [phone] Fax: [phone]     Social Drivers of Health (SDOH) Social History: SDOH Screenings   Food Insecurity: No Food Insecurity (05/14/2024)  Housing: Low Risk  (05/14/2024)  Transportation Needs: No Transportation Needs (05/14/2024)  Utilities: Not At Risk (05/14/2024)  Alcohol  Screen: Low Risk  (04/09/2023)  Depression (PHQ2-9): Low Risk  (05/05/2024)  Financial Resource Strain: Low Risk  (04/09/2023)  Physical Activity: Insufficiently Active (04/09/2023)  Social Connections: Socially Isolated (05/14/2024)  Stress: No Stress Concern Present (04/09/2023)  Tobacco Use: Low Risk  (05/14/2024)   SDOH Interventions:     Readmission Risk Interventions    05/20/2024   11:59 AM 03/29/2024    4:52 PM 01/26/2022    2:47 PM  Readmission Risk Prevention Plan  Transportation Screening Complete Complete Complete  PCP or Specialist Appt within 5-7 Days  Complete   PCP or Specialist Appt within 3-5 Days Complete  Complete  Home Care Screening  Complete   Medication Review (RN CM)  Complete   HRI or Home Care Consult Complete  Complete  Social Work Consult for Recovery Care Planning/Counseling Complete  Complete  Palliative Care Screening Not Applicable  Not Applicable  Medication Review Oceanographer) Complete  Complete

## 2024-05-20 NOTE — Discharge Summary (Signed)
 Physician Discharge Summary   Patient: Alison Richardson MRN: [id number] DOB: April 02, 1933  Admit date:     05/14/2024  Discharge date: 05/20/24  Discharge Physician: Roise Cleaver   PCP: Hadassah Letters, MD   Recommendations at discharge:   Follow-up outpatient closely with cardiology See PCP to discuss chronic medication management  Discharge Diagnoses: Principal Problem:   Acute on chronic combined systolic and diastolic CHF (congestive heart failure) (HCC) Active Problems:   NSTEMI (non-ST elevated myocardial infarction) (HCC)   CAD (coronary artery disease)   Acute respiratory failure with hypoxia (HCC)   Hypothyroidism   Leukocytosis   Paroxysmal atrial fibrillation (HCC)   Depression with anxiety   Cognitive impairment, mild, so stated   PAD (peripheral artery disease) (HCC)   DM type 2 (diabetes mellitus, type 2) (HCC)   Lactic acidosis  Resolved Problems:   * No resolved hospital problems. *  Hospital Course: Alison Richardson is an 89 year old female with dementia, hypertension, diabetes, CHF, atrial fibrillation on Eliquis  and amiodarone , PAD, recently hospitalized from 5 16-5/17 with hypotension that improved with medication adjustments.  She presented to the ED on this admission for dyspnea.  In the ED she was found to be tachypneic, hypoxic to 87% on 4 L and was ultimately transitioned to high flow nasal cannula and then BiPAP.  WBC revealed leukocytosis of 24, lactic acid 2.8, negative respiratory viral panel. Procal <0.10.  Troponin rose from 14 -> 108 --> 282 with a BNP of 290.  Cardiology was consulted.  Patient was started on heparin  drip.  Was treated with steroids, antibiotics, and breathing treatments, gradually her respiratory status improved.  Pulmonology was consulted.  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  She had no evidence of COPD on prior spirometry.  Ultimately amiodarone  was discontinued. Patient underwent PCI on 5/27 and 2  DES were placed to the right coronary artery.  Patient was started on aspirin , Plavix , and Eliquis  with plans to discontinue aspirin  after 1 week.  By reevaluation on 5/28 patient had no further oxygen requirements and reported that her dyspnea was completely resolved.  She was able to ambulate about the floor without issue and without evidence of desaturation.  Right and left heart catheterization revealed normal left and right heart filling pressures.  Pulmonology suspects hypoxic respiratory failure largely cardiac in origin. [name] was anxious to return home.  Home health arrangements were made prior to her discharge.  I discussed the care plan extensively with the patient as well as with her son and son-in-law at bedside.  They agree with care plan for discharge and reports that they will accompany the patient home.   Acute hypoxic respiratory failure - Initially required BiPAP on presentation - Leukocytosis 24 in presentation but procalcitonin less than 1.  Received ceftriaxone  azithromycin  x 1.  No further evidence of pneumonia - Received 6 days of steroid therapy and nebulizer Treatments - No evidence of PE on CT. - CT with small pleural effusions and septal thickening with groundglass opacities.  Concern for amiodarone  toxicity - Pulmonology consulted - Amiodarone  stopped - Review of prior PFTs reveals no evidence of COPD - Patient improved significantly and is on room air with no evidence of desaturation or dyspnea on exertion  NSTEMI - Status post left/right heart cath and coronary angiogram 5/27.  2 nonoverlapping drug-eluting stents placed to the RCA. - Chest pain and dyspnea completely improved - Cardiology recommends holding off on beta-blocker for now given borderline bradycardia -  Cardiology has recommended to proceed with apixaban  and Plavix  indefinitely, aspirin  for next 7 days - Continue with statin -- Outpatient follow-up with cardiology  Persistent atrial  fibrillation  QT prolongation - Amiodarone  discontinued this admission - Continue with Eliquis  - Rate controlled during this admission  Acute on chronic heart failure with mildly reduced ejection fraction - Low-dose ARB has been added - Outpatient follow-up with cardiology at heart failure clinic.  BMI 25       Consultants: Cardiology, pulmonology, palliative care Procedures performed: Heart cath 5/27 Disposition: Assisted living with home health services Diet recommendation:  Discharge Diet Orders (From admission, onward)     Start     Ordered   05/20/24 0000  Diet general        05/20/24 1526           Cardiac diet DISCHARGE MEDICATION: Allergies as of 05/20/2024       Reactions   Pantoprazole  Nausea And Vomiting   Metformin And Related Diarrhea        Medication List     STOP taking these medications    amiodarone  200 MG tablet Commonly known as: PACERONE    metoprolol  succinate 25 MG 24 hr tablet Commonly known as: TOPROL -XL   potassium chloride  SA 20 MEQ tablet Commonly known as: KLOR-CON  M       TAKE these medications    albuterol  (2.5 MG/3ML) 0.083% nebulizer solution Commonly known as: PROVENTIL  Take 3 mLs (2.5 mg total) by nebulization every 6 (six) hours as needed for wheezing or shortness of breath.   apixaban  5 MG Tabs tablet Commonly known as: ELIQUIS  Take 1 tablet (5 mg total) by mouth 2 (two) times daily.   aspirin  EC 81 MG tablet Take 1 tablet (81 mg total) by mouth daily for 7 days. Swallow whole. Start taking on: May 21, 2024   clopidogrel  75 MG tablet Commonly known as: PLAVIX  Take 1 tablet (75 mg total) by mouth daily with breakfast. Start taking on: May 21, 2024   donepezil  10 MG tablet Commonly known as: ARICEPT  TAKE ONE TABLET (10 MG) BY MOUTH AT BEDTIME   famotidine  20 MG tablet Commonly known as: PEPCID  Take 1 tablet (20 mg total) by mouth 2 (two) times daily before a meal.   gabapentin  100 MG  capsule Commonly known as: NEURONTIN  Take 1 capsule (100 mg total) by mouth at bedtime.   levothyroxine  88 MCG tablet Commonly known as: SYNTHROID  TAKE 1 TABLET EVERY DAY ON EMPTY STOMACHWITH A GLASS OF WATER AT LEAST 30-60 MINBEFORE BREAKFAST   losartan 25 MG tablet Commonly known as: COZAAR Take 0.5 tablets (12.5 mg total) by mouth daily. Start taking on: May 21, 2024   nitroGLYCERIN  0.4 MG SL tablet Commonly known as: NITROSTAT  Place 1 tablet (0.4 mg total) under the tongue every 5 (five) minutes as needed for chest pain.   Ozempic  (0.25 or 0.5 MG/DOSE) 2 MG/1.5ML Sopn Generic drug: Semaglutide (0.25 or 0.5MG /DOS) Inject 0.5 mg into the skin once a week.   pravastatin  20 MG tablet Commonly known as: PRAVACHOL  Take 1 tablet (20 mg total) by mouth at bedtime.   Spiriva  Respimat 1.25 MCG/ACT Aers Generic drug: Tiotropium Bromide Monohydrate  Inhale into the lungs.   torsemide  20 MG tablet Commonly known as: DEMADEX  Take 1 tablet (20 mg total) by mouth daily as needed. For leg edema or sob   venlafaxine  XR 75 MG 24 hr capsule Commonly known as: EFFEXOR -XR TAKE 1 CAPSULE BY MOUTH ONCE DAILY.  Durable Medical Equipment  (From admission, onward)           Start     Ordered   05/20/24 1234  For home use only DME 4 wheeled rolling walker with seat  Once       Question:  Patient needs a walker to treat with the following condition  Answer:  Acute exacerbation of CHF (congestive heart failure) (HCC)   05/20/24 1236   05/16/24 1020  For home use only DME Bedside commode  Once       Question:  Patient needs a bedside commode to treat with the following condition  Answer:  Dyspnea   05/16/24 1019            Follow-up Information     [facility]. Go on 05/28/2024.   Specialty: Cardiology Why: Hospital Follow-Up 05/28/24 @ 1:30PM Please bring all medications to follow-up appointment [facility],[address]  Floor, [address] Free Valet Parking at the door Contact information: [address] [place]  [address] [phone]               Discharge Exam: Filed Weights   05/19/24 0508 05/19/24 0851 05/20/24 0500  Weight: 72.6 kg 72.6 kg 72.5 kg   Constitutional:  Normal appearance. Non toxic-appearing.  HENT: Head Normocephalic and atraumatic.  Mucous membranes are moist.  Eyes:  Extraocular intact. Conjunctivae normal. Pupils are equal, round, and reactive to light.  Cardiovascular: Rate and Rhythm: Normal rate and regular rhythm.  Pulmonary: Non labored, symmetric rise of chest wall.  Musculoskeletal:  Normal range of motion.  Skin: warm and dry. not jaundiced.  Neurological: No focal deficit present. alert. Oriented. Psychiatric: Mood and Affect congruent.    Condition at discharge: stable  The results of significant diagnostics from this hospitalization (including imaging, microbiology, ancillary and laboratory) are listed below for reference.   Imaging Studies: CARDIAC CATHETERIZATION Result Date: 05/19/2024 Conclusions: Relatively stable appearance of severe two-vessel CAD, including chronic total occlusion of D1 branch and sequential 99% proximal, 40-50% mid, and 70-80% distal RCA stenoses.  There are also moderate, nonobstructive lesions in the ostial/proximal LAD, proximal/mid LCx, and RPDA/RPAV branches. Normal left and right heart filling pressures (mean RA 4, RV 40/5, PCWP 15, LVEDP 15 mmHg).  Prominent V waves noted on PCWP tracing suggest significant mitral regurgitation. Mild pulmonary hypertension (PA 36/15, mean 22 mmHg). Normal Fick cardiac output/index (CO 4.9 L/min, CI 2.7 L/min/m). Successful PCI to proximal and distal RCA stenoses using nonoverlapping Onyx Frontier 3.0 x 15 mm (proximal) and 2.75 x 15 mm (distal) drug-eluting stents with 0% residual stenosis and TIMI-3 flow. Recommendations: If no evidence of bleeding or vascular  injury, resume apixaban  5 mg twice daily this evening.  Anticipate therapy with apixaban , clopidogrel , and aspirin  for 1 week, after which time aspirin  can be stopped.  Continue apixaban  and clopidogrel  therapy for up to 12 months, as tolerated (could switch clopidogrel  to aspirin  at 6 months if there is concern for high bleeding risk). Maintain net even fluid balance. Escalate goal-directed medical therapy as tolerated. Aggressive secondary prevention of coronary artery disease. Sammy Crisp, MD [facility]  CT CHEST WO CONTRAST Result Date: 05/15/2024 CLINICAL DATA:  Dyspnea EXAM: CT CHEST WITHOUT CONTRAST TECHNIQUE: Multidetector CT imaging of the chest was performed following the standard protocol without IV contrast. RADIATION DOSE REDUCTION: This exam was performed according to the departmental dose-optimization program which includes automated exposure control, adjustment of the mA and/or  kV according to patient size and/or use of iterative reconstruction technique. COMPARISON:  Chest x-ray 05/14/2024, CT chest 12/10/2022 FINDINGS: Cardiovascular: Limited evaluation without intravenous contrast. Moderate aortic atherosclerosis. No aneurysm. Cardiomegaly. Coronary vascular calcification. No pericardial effusion Mediastinum/Nodes: Patent trachea. No thyroid  mass. Multiple mildly enlarged lymph nodes. Right paratracheal node up to 10 mm. Precarinal node measuring up to 14 mm. Esophagus within normal limits. Small hiatal hernia Lungs/Pleura: Small moderate bilateral pleural effusions. Diffuse septal thickening. Multifocal bilateral ground-glass densities. Partial atelectasis in the lower lobes. No pneumothorax Upper Abdomen: No acute finding. Musculoskeletal: No acute osseous abnormality. Multilevel degenerative changes. IMPRESSION: 1. Cardiomegaly with small to moderate bilateral pleural effusions and diffuse septal thickening. Multifocal bilateral ground-glass densities, findings are favored to  represent pulmonary edema. Superimposed infection not excluded. 2. Mild mediastinal adenopathy, likely reactive. 3. Aortic atherosclerosis. Aortic Atherosclerosis (ICD10-I70.0). Electronically Signed   By: Esmeralda Hedge M.D.   On: 05/15/2024 23:30   ECHOCARDIOGRAM COMPLETE Result Date: 05/14/2024    ECHOCARDIOGRAM REPORT   Patient Name:   Alison Richardson Date of Exam: 05/14/2024 Medical Rec #:  [id number]      Height:       66.0 in Accession #:    [id number]     Weight:       164.9 lb Date of Birth:  Dec 13, 1933       BSA:          1.842 m Patient Age:    89 years       BP:           123/73 mmHg Patient Gender: F              HR:           92 bpm. Exam Location:  [facility] Procedure: 2D Echo, Cardiac Doppler and Color Doppler (Both Spectral and Color            Flow Doppler were utilized during procedure). Indications:     NSTEMI I21.4  History:         Patient has prior history of Echocardiogram examinations, most                  recent 01/01/2024. CHF, Arrythmias:Atrial Fibrillation; Risk                  Factors:Hypertension.  Sonographer:     Broadus Canes Referring Phys:  [id number] Lanetta Pion Diagnosing Phys: Constancia Delton MD  Sonographer Comments: Suboptimal apical window. IMPRESSIONS  1. Left ventricular ejection fraction, by estimation, is 40 to 45%. The left ventricle has mild to moderately decreased function. The left ventricle demonstrates global hypokinesis. Left ventricular diastolic parameters are indeterminate.  2. Right ventricular systolic function is normal. The right ventricular size is moderately enlarged. There is moderately elevated pulmonary artery systolic pressure. The estimated right ventricular systolic pressure is 54.3 mmHg.  3. The mitral valve is normal in structure. Mild mitral valve regurgitation.  4. Tricuspid valve regurgitation is mild to moderate.  5. The aortic valve is tricuspid. Aortic valve regurgitation is not visualized.  6. The inferior vena cava is normal in size with greater  than 50% respiratory variability, suggesting right atrial pressure of 3 mmHg. FINDINGS  Left Ventricle: Left ventricular ejection fraction, by estimation, is 40 to 45%. The left ventricle has mild to moderately decreased function. The left ventricle demonstrates global hypokinesis. The left ventricular internal cavity size was normal in size. There is no left ventricular hypertrophy. Left ventricular diastolic parameters are  indeterminate. Right Ventricle: The right ventricular size is moderately enlarged. No increase in right ventricular wall thickness. Right ventricular systolic function is normal. There is moderately elevated pulmonary artery systolic pressure. The tricuspid regurgitant  velocity is 3.58 m/s, and with an assumed right atrial pressure of 3 mmHg, the estimated right ventricular systolic pressure is 54.3 mmHg. Left Atrium: Left atrial size was normal in size. Right Atrium: Right atrial size was normal in size. Pericardium: There is no evidence of pericardial effusion. Mitral Valve: The mitral valve is normal in structure. Mild mitral valve regurgitation. Tricuspid Valve: The tricuspid valve is normal in structure. Tricuspid valve regurgitation is mild to moderate. Aortic Valve: The aortic valve is tricuspid. Aortic valve regurgitation is not visualized. Aortic valve mean gradient measures 3.0 mmHg. Aortic valve peak gradient measures 4.4 mmHg. Aortic valve area, by VTI measures 1.79 cm. Pulmonic Valve: The pulmonic valve was not well visualized. Pulmonic valve regurgitation is not visualized. Aorta: The aortic root is normal in size and structure. Venous: The inferior vena cava is normal in size with greater than 50% respiratory variability, suggesting right atrial pressure of 3 mmHg. IAS/Shunts: No atrial level shunt detected by color flow Doppler.  LEFT VENTRICLE PLAX 2D LVIDd:         4.00 cm     Diastology LVIDs:         3.40 cm     LV e' medial:    9.57 cm/s LV PW:         0.90 cm     LV E/e'  medial:  9.5 LV IVS:        1.10 cm     LV e' lateral:   11.30 cm/s LVOT diam:     2.00 cm     LV E/e' lateral: 8.1 LV SV:         42 LV SV Index:   23 LVOT Area:     3.14 cm  LV Volumes (MOD) LV vol d, MOD A4C: 55.7 ml LV vol s, MOD A4C: 38.3 ml LV SV MOD A4C:     55.7 ml RIGHT VENTRICLE RV Basal diam:  5.20 cm RV Mid diam:    4.00 cm RV S prime:     12.30 cm/s TAPSE (M-mode): 1.7 cm LEFT ATRIUM         Index       RIGHT ATRIUM           Index LA diam:    4.00 cm 2.17 cm/m  RA Area:     16.70 cm                                 RA Volume:   44.30 ml  24.05 ml/m  AORTIC VALVE AV Area (Vmax):    2.03 cm AV Area (Vmean):   1.74 cm AV Area (VTI):     1.79 cm AV Vmax:           105.00 cm/s AV Vmean:          78.550 cm/s AV VTI:            0.234 m AV Peak Grad:      4.4 mmHg AV Mean Grad:      3.0 mmHg LVOT Vmax:         67.90 cm/s LVOT Vmean:        43.500 cm/s LVOT VTI:  0.133 m LVOT/AV VTI ratio: 0.57  AORTA Ao Root diam: 3.20 cm MITRAL VALVE               TRICUSPID VALVE MV Area (PHT): 3.23 cm    TR Peak grad:   51.3 mmHg MV Decel Time: 235 msec    TR Vmax:        358.00 cm/s MV E velocity: 91.20 cm/s                            SHUNTS                            Systemic VTI:  0.13 m                            Systemic Diam: 2.00 cm Constancia Delton MD Electronically signed by Constancia Delton MD Signature Date/Time: 05/14/2024/4:18:36 PM    Final    DG Chest Portable 1 View Result Date: 05/14/2024 CLINICAL DATA:  Dyspnea, hypoxia, CHF EXAM: PORTABLE CHEST 1 VIEW COMPARISON:  05/08/2024 FINDINGS: The lungs are symmetrically well expanded. Interval development of diffuse interstitial pulmonary edema, in keeping with changes of mild to moderate cardiogenic failure. No pneumothorax or pleural effusion. Cardiac size is mildly enlarged, stable. No acute bone abnormality. IMPRESSION: 1. Mild to moderate cardiogenic failure. Electronically Signed   By: Worthy Heads M.D.   On: 05/14/2024 00:48   DG  Chest 2 View Result Date: 05/08/2024 CLINICAL DATA:  Weakness, dizziness, and hypotension EXAM: CHEST - 2 VIEW COMPARISON:  Chest radiograph dated 03/26/2024 FINDINGS: Normal lung volumes. Bibasilar linear opacities, left greater than right. No pleural effusion or pneumothorax. Similar enlarged cardiomediastinal silhouette. No acute osseous abnormality. IMPRESSION: 1. Bibasilar linear opacities, left greater than right, likely atelectasis. 2. Similar cardiomegaly. Electronically Signed   By: Limin  Xu M.D.   On: 05/08/2024 12:49    Microbiology: Results for orders placed or performed during the hospital encounter of 05/14/24  Resp panel by RT-PCR (RSV, Flu A&B, Covid) Anterior Nasal Swab     Status: None   Collection Time: 05/14/24 12:34 AM   Specimen: Anterior Nasal Swab  Result Value Ref Range Status   SARS Coronavirus 2 by RT PCR NEGATIVE NEGATIVE Final    Comment: (NOTE) SARS-CoV-2 target nucleic acids are NOT DETECTED.  The SARS-CoV-2 RNA is generally detectable in upper respiratory specimens during the acute phase of infection. The lowest concentration of SARS-CoV-2 viral copies this assay can detect is 138 copies/mL. A negative result does not preclude SARS-Cov-2 infection and should not be used as the sole basis for treatment or other patient management decisions. A negative result may occur with  improper specimen collection/handling, submission of specimen other than nasopharyngeal swab, presence of viral mutation(s) within the areas targeted by this assay, and inadequate number of viral copies(<138 copies/mL). A negative result must be combined with clinical observations, patient history, and epidemiological information. The expected result is Negative.  Fact Sheet for Patients:  BloggerCourse.com  Fact Sheet for Healthcare Providers:  SeriousBroker.it  This test is no t yet approved or cleared by the [place]  FDA and   has been authorized for detection and/or diagnosis of SARS-CoV-2 by FDA under an Emergency Use Authorization (EUA). This EUA will remain  in effect (meaning this test can be used) for the duration of the COVID-19 declaration under Section  564(b)(1) of the Act, 21 U.S.C.section 360bbb-3(b)(1), unless the authorization is terminated  or revoked sooner.       Influenza A by PCR NEGATIVE NEGATIVE Final   Influenza B by PCR NEGATIVE NEGATIVE Final    Comment: (NOTE) The Xpert Xpress SARS-CoV-2/FLU/RSV plus assay is intended as an aid in the diagnosis of influenza from Nasopharyngeal swab specimens and should not be used as a sole basis for treatment. Nasal washings and aspirates are unacceptable for Xpert Xpress SARS-CoV-2/FLU/RSV testing.  Fact Sheet for Patients: BloggerCourse.com  Fact Sheet for Healthcare Providers: SeriousBroker.it  This test is not yet approved or cleared by the [place]  FDA and has been authorized for detection and/or diagnosis of SARS-CoV-2 by FDA under an Emergency Use Authorization (EUA). This EUA will remain in effect (meaning this test can be used) for the duration of the COVID-19 declaration under Section 564(b)(1) of the Act, 21 U.S.C. section 360bbb-3(b)(1), unless the authorization is terminated or revoked.     Resp Syncytial Virus by PCR NEGATIVE NEGATIVE Final    Comment: (NOTE) Fact Sheet for Patients: BloggerCourse.com  Fact Sheet for Healthcare Providers: SeriousBroker.it  This test is not yet approved or cleared by the [place]  FDA and has been authorized for detection and/or diagnosis of SARS-CoV-2 by FDA under an Emergency Use Authorization (EUA). This EUA will remain in effect (meaning this test can be used) for the duration of the COVID-19 declaration under Section 564(b)(1) of the Act, 21 U.S.C. section 360bbb-3(b)(1),  unless the authorization is terminated or revoked.  Performed at [facility], [address] [place], [place] [address]   Blood culture (routine x 2)     Status: None   Collection Time: 05/14/24  2:07 AM   Specimen: BLOOD  Result Value Ref Range Status   Specimen Description BLOOD LEFT ARM  Final   Special Requests   Final    BOTTLES DRAWN AEROBIC AND ANAEROBIC Blood Culture adequate volume   Culture   Final    NO GROWTH 5 DAYS Performed at [facility], [address] [place], [place] [address]    Report Status 05/19/2024 FINAL  Final  Blood culture (routine x 2)     Status: None   Collection Time: 05/14/24  2:07 AM   Specimen: BLOOD  Result Value Ref Range Status   Specimen Description BLOOD RIGHT ARM  Final   Special Requests   Final    BOTTLES DRAWN AEROBIC AND ANAEROBIC Blood Culture adequate volume   Culture   Final    NO GROWTH 5 DAYS Performed at [facility], [address] [place], [place] [address]    Report Status 05/19/2024 FINAL  Final    Labs: CBC: Recent Labs  Lab 05/14/24 0034 05/15/24 0553 05/16/24 9147 05/17/24 0138 05/18/24 0258 05/19/24 0323 05/19/24 1043 05/19/24 1048 05/20/24 0444  WBC 24.4*   < > 19.2* 17.8* 13.5* 13.8*  --   --  12.9*  NEUTROABS 18.0*  --   --   --   --   --   --   --   --   HGB 12.3   < > 10.0* 9.6* 10.2* 10.8* 11.2* 10.9* 10.4*  HCT 40.4   < > 31.6* 29.9* 31.4* 34.4* 33.0* 32.0* 33.0*  MCV 85.2   < > 83.4 82.6 82.0 82.5  --   --  82.7  PLT 405*   < > 311 296 313 300  --   --  313   < > =  values in this interval not displayed.   Basic Metabolic Panel: Recent Labs  Lab 05/15/24 0553 05/16/24 0625 05/17/24 0900 05/19/24 0323 05/19/24 1043 05/19/24 1048 05/20/24 0444  NA 136 141 139 139 134* 136 139  K 3.7 4.0 3.3* 3.4* 3.7 3.8 4.9  CL 104 105 100 96*  --   --  101  CO2 22 25 26 28   --   --  28  GLUCOSE 185* 235* 137* 180*  --   --  115*  BUN 28* 22 21 22   --   --  20   CREATININE 0.90 0.83 0.75 0.64  --   --  0.67  CALCIUM  7.8* 8.6* 8.6* 9.0  --   --  8.8*  MG  --  2.2  --   --   --   --   --    Liver Function Tests: Recent Labs  Lab 05/14/24 0034  AST 49*  ALT 39  ALKPHOS 89  BILITOT 1.0  PROT 7.2  ALBUMIN 4.0   CBG: Recent Labs  Lab 05/19/24 0854 05/19/24 1229 05/19/24 1617 05/20/24 0736 05/20/24 1137  GLUCAP 166* 230* 224* 126* 209*    Discharge time spent: 32 minutes.  Signed: Lurlean Kernen, DO [facility] 05/20/2024

## 2024-05-20 NOTE — Progress Notes (Signed)
 Physical Therapy Treatment Patient Details Name: Alison Richardson MRN: [id number] DOB: 1933/11/17 Today's Date: 05/20/2024   History of Present Illness Pt is an 89 y/o female admitted secondary to SOB and found to have elevated troponin. Cardiology was consulted and pt found to have a NSTEMI with planned cardiac cath on 5/27. PMH including but not limited to significant dementia, CAD medically managed, persistent A-fib, HFimpEF, valvular heart disease, DM2, HTN, HLD, and hypothyroidism.    PT Comments  Patient eager to mobilize this date. Able to complete bed mobility modI. Stood from low bed surface and low toilet with supervision and RW. Ambulated 200' with RW and supervision on RA with spO2 >92%. Tolerated well and appreciative of mobility as patient has not mobilized since admission date. Discharge plan remains appropriate.     If plan is discharge home, recommend the following: A little help with walking and/or transfers;A little help with bathing/dressing/bathroom;Assist for transportation   Can travel by private vehicle        Equipment Recommendations  None recommended by PT    Recommendations for Other Services       Precautions / Restrictions Precautions Precautions: Fall Recall of Precautions/Restrictions: Intact Restrictions Weight Bearing Restrictions Per Provider Order: No     Mobility  Bed Mobility Overal bed mobility: Modified Independent                  Transfers Overall transfer level: Needs assistance Equipment used: Rolling Laithan Conchas (2 wheels) Transfers: Sit to/from Stand Sit to Stand: Supervision                Ambulation/Gait Ambulation/Gait assistance: Contact guard assist, Supervision Gait Distance (Feet): 200 Feet Assistive device: Rolling Annis Lagoy (2 wheels) Gait Pattern/deviations: Step-through pattern, Decreased stride length Gait velocity: decreased     General Gait Details: CGA initially, progressing to supervision. No LOB  noted   Stairs             Wheelchair Mobility     Tilt Bed    Modified Rankin (Stroke Patients Only)       Balance Overall balance assessment: Needs assistance Sitting-balance support: Feet supported Sitting balance-Leahy Scale: Good     Standing balance support: During functional activity Standing balance-Leahy Scale: Fair                              Hotel manager: No apparent difficulties  Cognition Arousal: Alert Behavior During Therapy: WFL for tasks assessed/performed   PT - Cognitive impairments: History of cognitive impairments (pt with dementia at baseline, but A&O x4 today, no cognitive concerns with general conversation)                         Following commands: Intact      Cueing    Exercises      General Comments General comments (skin integrity, edema, etc.): Ambulated on RA with spO2 >92%      Pertinent Vitals/Pain Pain Assessment Pain Assessment: No/denies pain    Home Living                          Prior Function            PT Goals (current goals can now be found in the care plan section) Acute Rehab PT Goals PT Goal Formulation: With patient Time For Goal Achievement: 05/31/24 Potential to Achieve Goals: Good  Progress towards PT goals: Progressing toward goals    Frequency    Min 2X/week      PT Plan      Co-evaluation              AM-PAC PT "6 Clicks" Mobility   Outcome Measure  Help needed turning from your back to your side while in a flat bed without using bedrails?: None Help needed moving from lying on your back to sitting on the side of a flat bed without using bedrails?: None Help needed moving to and from a bed to a chair (including a wheelchair)?: A Little Help needed standing up from a chair using your arms (e.g., wheelchair or bedside chair)?: A Little Help needed to walk in hospital room?: A Little Help needed climbing 3-5 steps  with a railing? : A Little 6 Click Score: 20    End of Session   Activity Tolerance: Patient tolerated treatment well Patient left: in chair;with call bell/phone within reach;with chair alarm set;with family/visitor present Nurse Communication: Mobility status PT Visit Diagnosis: Other abnormalities of gait and mobility (R26.89)     Time: 6045-4098 PT Time Calculation (min) (ACUTE ONLY): 18 min  Charges:    $Therapeutic Activity: 8-22 mins PT General Charges $$ ACUTE PT VISIT: 1 Visit                     Janine Melbourne, PT, DPT Physical Therapist - [facility]  [facility]    Alonnah Lampkins A Bonney Berres 05/20/2024, 12:25 PM

## 2024-05-20 NOTE — Hospital Course (Signed)
 Alison Richardson is an 89 year old female with dementia, hypertension, diabetes, CHF, atrial fibrillation on Eliquis  and amiodarone , PAD, recently hospitalized from 5 16-5/17 with hypotension that improved with medication adjustments.  She presented to the ED on this admission for dyspnea.  In the ED she was found to be tachypneic, hypoxic to 87% on 4 L and was ultimately transitioned to high flow nasal cannula and then BiPAP.  WBC revealed leukocytosis of 24, lactic acid 2.8, negative respiratory viral panel. Procal <0.10.  Troponin rose from 14 -> 108 --> 282 with a BNP of 290.  Cardiology was consulted.  Patient was started on heparin  drip.  Was treated with steroids, antibiotics, and breathing treatments, gradually her respiratory status improved.  Pulmonology was consulted.  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  She had no evidence of COPD on prior spirometry.  Ultimately amiodarone  was discontinued. Patient underwent PCI on 5/27 and 2 DES were placed to the right coronary artery.  Patient was started on aspirin , Plavix , and Eliquis  with plans to discontinue aspirin  after 1 week.  By reevaluation on 5/28 patient had no further oxygen requirements and reported that her dyspnea was completely resolved.  She was able to ambulate about the floor without issue and without evidence of desaturation.  Right and left heart catheterization revealed normal left and right heart filling pressures.  Pulmonology suspects hypoxic respiratory failure largely cardiac in origin. [name] was anxious to return home.  Home health arrangements were made prior to her discharge.  I discussed the care plan extensively with the patient as well as with her son and son-in-law at bedside.  They agree with care plan for discharge and reports that they will accompany the patient home.   Acute hypoxic respiratory failure - Initially required BiPAP on presentation - Leukocytosis 24 in presentation  but procalcitonin less than 1.  Received ceftriaxone  azithromycin  x 1.  No further evidence of pneumonia - Received 6 days of steroid therapy and nebulizer Treatments - No evidence of PE on CT. - CT with small pleural effusions and septal thickening with groundglass opacities.  Concern for amiodarone  toxicity - Pulmonology consulted - Amiodarone  stopped - Review of prior PFTs reveals no evidence of COPD - Patient improved significantly and is on room air with no evidence of desaturation or dyspnea on exertion  NSTEMI - Status post left/right heart cath and coronary angiogram 5/27.  2 nonoverlapping drug-eluting stents placed to the RCA. - Chest pain and dyspnea completely improved - Cardiology recommends holding off on beta-blocker for now given borderline bradycardia - Cardiology has recommended to proceed with apixaban  and Plavix  indefinitely, aspirin  for next 7 days - Continue with statin -- Outpatient follow-up with cardiology  Persistent atrial fibrillation  QT prolongation - Amiodarone  discontinued this admission - Continue with Eliquis  - Rate controlled during this admission  Acute on chronic heart failure with mildly reduced ejection fraction - Low-dose ARB has been added - Outpatient follow-up with cardiology at heart failure clinic.

## 2024-05-20 NOTE — TOC Transition Note (Signed)
 Transition of Care [facility]) - Discharge Note   Patient Details  Name: Alison Richardson MRN: [id number] Date of Birth: [birth year]  [facility]) CM/SW Contact:  Odilia Bennett, LCSW Phone Number: 05/20/2024, 4:07 PM   Clinical Narrative:   Patient has orders to discharge home today. CSW notified [facility] [place] that the home health orders are in. Patient's rollator has been delivered. No further concerns. CSW signing off.  Final next level of care: Home w Home Health Services Barriers to Discharge: Barriers Resolved   Patient Goals and CMS Choice     Choice offered to / list presented to : NA      Discharge Placement                Patient to be transferred to facility by: Son and son-in-law   Patient and family notified of of transfer: 05/20/24  Discharge Plan and Services Additional resources added to the After Visit Summary for       Post Acute Care Choice: Resumption of Svcs/PTA Provider                    HH Arranged: PT, OT          Social Drivers of Health (SDOH) Interventions SDOH Screenings   Food Insecurity: No Food Insecurity (05/20/2024)  Housing: Low Risk  (05/20/2024)  Transportation Needs: No Transportation Needs (05/20/2024)  Utilities: Not At Risk (05/14/2024)  Alcohol Screen: Low Risk  (04/09/2023)  Depression (PHQ2-9): Low Risk  (05/05/2024)  Financial Resource Strain: Low Risk  (05/20/2024)  Physical Activity: Insufficiently Active (04/09/2023)  Social Connections: Socially Isolated (05/14/2024)  Stress: No Stress Concern Present (04/09/2023)  Tobacco Use: Low Risk  (05/20/2024)     Readmission Risk Interventions    05/20/2024   11:59 AM 03/29/2024    4:52 PM 01/26/2022    2:47 PM  Readmission Risk Prevention Plan  Transportation Screening Complete Complete Complete  PCP or Specialist Appt within 5-7 Days  Complete   PCP or Specialist Appt within 3-5 Days Complete  Complete  Home Care Screening  Complete   Medication Review (RN  CM)  Complete   HRI or Home Care Consult Complete  Complete  Social Work Consult for Recovery Care Planning/Counseling Complete  Complete  Palliative Care Screening Not Applicable  Not Applicable  Medication Review Oceanographer) Complete  Complete

## 2024-05-26 ENCOUNTER — Encounter: Payer: Self-pay | Admitting: Pediatrics

## 2024-05-26 ENCOUNTER — Ambulatory Visit: Admitting: Pediatrics

## 2024-05-26 VITALS — BP 126/73 | HR 84 | Temp 97.5°F | Wt 164.8 lb

## 2024-05-26 DIAGNOSIS — I214 Non-ST elevation (NSTEMI) myocardial infarction: Secondary | ICD-10-CM

## 2024-05-26 DIAGNOSIS — Z09 Encounter for follow-up examination after completed treatment for conditions other than malignant neoplasm: Secondary | ICD-10-CM | POA: Diagnosis not present

## 2024-05-26 DIAGNOSIS — G301 Alzheimer's disease with late onset: Secondary | ICD-10-CM

## 2024-05-26 DIAGNOSIS — Z7985 Long-term (current) use of injectable non-insulin antidiabetic drugs: Secondary | ICD-10-CM

## 2024-05-26 DIAGNOSIS — I5032 Chronic diastolic (congestive) heart failure: Secondary | ICD-10-CM

## 2024-05-26 DIAGNOSIS — E1122 Type 2 diabetes mellitus with diabetic chronic kidney disease: Secondary | ICD-10-CM | POA: Diagnosis not present

## 2024-05-26 DIAGNOSIS — F0280 Dementia in other diseases classified elsewhere without behavioral disturbance: Secondary | ICD-10-CM

## 2024-05-26 DIAGNOSIS — I480 Paroxysmal atrial fibrillation: Secondary | ICD-10-CM

## 2024-05-26 NOTE — Progress Notes (Signed)
 Transitions of Care Note [facility] Follow Up   Alison Richardson is a 89 y.o. female presenting for Hospital Follow Up (Hospitalization Follow-up )  Summary of Hospitalization Dates of Hospitalization: 05/14/24 until 05/20/24 Chief reason for hospitalization: HF exacerbation  Secondary problems:   NSTEMI (non-ST elevated myocardial infarction) (HCC)   CAD (coronary artery disease)   Acute respiratory failure with hypoxia (HCC)   Hypothyroidism   Leukocytosis   Paroxysmal atrial fibrillation (HCC)   Depression with anxiety   Cognitive impairment, mild, so stated   PAD (peripheral artery disease) (HCC)   DM type 2 (diabetes mellitus, type 2) (HCC)  Follow up recommendations: follow up w cardiology  Called within 2 business days of discharge? yes, date provided: 05/11/24  Brief summary : admitted for acute hypoxia, found to have NSTEMI For further details, please see D/C summary dated : 05/14/2024  Interval History Today, the patient reports she feels well No chest pain, sob, palpations, orthopnea, dyspnea, PND, lower extremity edema.   Medications (current list documented below) 1. New medications prescribed at hospital discharge obtained by the patient? Yes 2. Patient taking the correct medications? Yes 3. Can patient/caregiver name the medications or carry an updated list? Yes 4. Medications reconciled at this visit? yes  Patient Empowerment and Education 1. Can patient/caregiver explain the reason for hospitalization and the nature of the care provided? yes 2. Can patient/caregiver explain how self-care/care in the home should be provided (BG checks, daily weights, diet restrictions, etc...)? yes 3. Does the patient/caregiver know what signs or symptoms indicate deterioration of their condition? Yes 4.  Does the patient/caregiver know what should be done if these symptoms occur? yes   Additional educational needs were met today as follows: none  Home Status 1. Is  the patient independent in performing ADL's? yes 2. Has the patient experienced a significant decline in physical functioning since hospitalization? no 3. Has the patient experienced a significant decline in cognitive functioning since hospitalization? no 4. Were Home Health services set up for the patient upon discharge? (Referrals are documented in the Case Manager [facility]) note {NOTES -> FILTERS -> AUTHOR TYPE: Case Manager.) yes, independently set up by patient and family  Is the patient receiving these services? yes 5. Is the patient receiving care management services? no  Communication 1. I have communicated with other members of the patient's care team such as the PCP, inpatient team, specialty physicians, care management, home health agency, or allied health providers about the patient.   Social History Social History   Social History Narrative   Not on file     Substance Use History Tobacco :  reports that she has never smoked. She has never used smokeless tobacco.  Alcohol :  reports that she does not currently use alcohol.  Recreational Drugs :  reports no history of drug use.   Review of Systems ROS  Current Medications: Current Outpatient Medications  Medication Sig Dispense Refill   albuterol  (PROVENTIL ) (2.5 MG/3ML) 0.083% nebulizer solution Take 3 mLs (2.5 mg total) by nebulization every 6 (six) hours as needed for wheezing or shortness of breath. 75 mL 3   apixaban  (ELIQUIS ) 5 MG TABS tablet Take 1 tablet (5 mg total) by mouth 2 (two) times daily. 180 tablet 3   clopidogrel  (PLAVIX ) 75 MG tablet Take 1 tablet (75 mg total) by mouth daily with breakfast. 90 tablet 0   donepezil  (ARICEPT ) 10 MG tablet TAKE ONE TABLET (10 MG) BY MOUTH AT BEDTIME 90 tablet 0  famotidine  (PEPCID ) 20 MG tablet Take 1 tablet (20 mg total) by mouth 2 (two) times daily before a meal. 60 tablet 0   gabapentin  (NEURONTIN ) 100 MG capsule Take 1 capsule (100 mg total) by mouth at bedtime.      levothyroxine  (SYNTHROID ) 88 MCG tablet TAKE 1 TABLET EVERY DAY ON EMPTY STOMACHWITH A GLASS OF WATER AT LEAST 30-60 MINBEFORE BREAKFAST 90 tablet 3   losartan  (COZAAR ) 25 MG tablet Take 0.5 tablets (12.5 mg total) by mouth daily. (Patient taking differently: Take 25 mg by mouth daily.) 30 tablet 0   nitroGLYCERIN  (NITROSTAT ) 0.4 MG SL tablet Place 1 tablet (0.4 mg total) under the tongue every 5 (five) minutes as needed for chest pain. 25 tablet 3   pravastatin  (PRAVACHOL ) 20 MG tablet Take 1 tablet (20 mg total) by mouth at bedtime. 90 tablet 3   Semaglutide , 1 MG/DOSE, (OZEMPIC , 1 MG/DOSE,) 2 MG/1.5ML SOPN Inject 1 mg into the skin once a week. 3 mL 0   [START ON 06/15/2024] Semaglutide , 2 MG/DOSE, 8 MG/3ML SOPN Inject 2 mg as directed once a week. 3 mL 6   Semaglutide ,0.25 or 0.5MG /DOS, (OZEMPIC , 0.25 OR 0.5 MG/DOSE,) 2 MG/1.5ML SOPN Inject 0.5 mg into the skin once a week. 6 mL 1   Tiotropium Bromide Monohydrate  (SPIRIVA  RESPIMAT) 1.25 MCG/ACT AERS Inhale into the lungs.     torsemide  (DEMADEX ) 20 MG tablet Take 1 tablet (20 mg total) by mouth daily as needed. For leg edema or sob 30 tablet 0   venlafaxine  XR (EFFEXOR -XR) 75 MG 24 hr capsule TAKE 1 CAPSULE BY MOUTH ONCE DAILY. 90 capsule 0   No current facility-administered medications for this visit.    Problem List: Patient Active Problem List   Diagnosis Date Noted   DM type 2 (diabetes mellitus, type 2) (HCC) 05/14/2024   Lactic acidosis 05/14/2024   Paroxysmal atrial fibrillation (HCC) 03/26/2024   Lumbar radiculopathy 02/06/2023   Depression with anxiety 12/09/2022   CAD (coronary artery disease) 12/09/2022   History of cerebral aneurysm 04/03/2022   Gastroesophageal reflux disease 02/12/2022   DDD (degenerative disc disease), lumbar    NSTEMI (non-ST elevated myocardial infarction) (HCC) 10/10/2021   Chronic diastolic CHF (congestive heart failure) (HCC) 10/13/2020   Leukocytosis 10/07/2020   Cor pulmonale, chronic (HCC)  09/27/2019   Late onset Alzheimer's disease without behavioral disturbance (HCC) 09/23/2019   Obstructive sleep apnea 07/09/2019   Type 2 diabetes mellitus with peripheral neuropathy (HCC) 01/21/2019   PSVT (paroxysmal supraventricular tachycardia) (HCC) 01/21/2019   PAD (peripheral artery disease) (HCC) 01/06/2019   Hyperlipidemia associated with type 2 diabetes mellitus (HCC) 09/08/2018   Anxiety disorder 06/19/2018   Depression, major, single episode, mild (HCC) 06/19/2018   Vitamin B12 deficiency 06/19/2018   Insomnia 06/19/2018   Hypertension associated with diabetes (HCC) 06/19/2018   Hypothyroidism 06/19/2018   Overactive bladder 06/19/2018   Primary osteoarthritis involving multiple joints 06/18/2018    Objective:   Today's Vitals   05/26/24 1425 05/26/24 1434  BP: (!) 148/77 126/73  Pulse: 84 84  Temp: (!) 97.5 F (36.4 C)   TempSrc: Oral   SpO2: 97%   Weight: 164 lb 12.8 oz (74.8 kg)   PainSc: 5    PainLoc: Back    Body mass index is 26.6 kg/m.  Physical Exam Constitutional:      Appearance: Normal appearance.  HENT:     Head: Normocephalic and atraumatic.  Eyes:     Pupils: Pupils are equal, round, and reactive to  light.  Cardiovascular:     Rate and Rhythm: Normal rate and regular rhythm.     Pulses: Normal pulses.     Heart sounds: Normal heart sounds.  Pulmonary:     Effort: Pulmonary effort is normal.     Breath sounds: Normal breath sounds.  Abdominal:     General: Abdomen is flat.     Palpations: Abdomen is soft.  Musculoskeletal:        General: Normal range of motion.     Cervical back: Normal range of motion.  Skin:    General: Skin is warm and dry.     Capillary Refill: Capillary refill takes less than 2 seconds.  Neurological:     General: No focal deficit present.     Mental Status: She is alert. Mental status is at baseline.  Psychiatric:        Mood and Affect: Mood normal.        Behavior: Behavior normal.        Assessment  and Plan:   Assessment & Plan   Hospitalization reviewed, medications reviewed. Plan and follow up for acute concerns below.  Hospital discharge follow-up  NSTEMI (non-ST elevated myocardial infarction) (HCC) Chronic diastolic CHF (congestive heart failure) (HCC) Assessment & Plan: Recent admission for acute hypoxia thought to be secondary to cardiac etiology. Asymptomatic today. HD stable, well appearing on exam. Some concern about amiodarone  toxicity so this was discontinued. Status post left/right heart cath and coronary angiogram 5/27. 2 nonoverlapping drug-eluting stents placed to the RCA. She follows closely with cardiology. Currently on plavix  75mg , cozaar  25mg , nitrostat  prn, torsemide  20mg  prn.   Type 2 diabetes mellitus with diabetic chronic kidney disease, unspecified CKD stage, unspecified whether long term insulin  use (HCC) Assessment & Plan: She is on ozempic  0.5mg , Continue titrating up.   Orders: -     Ozempic  (1 MG/DOSE); Inject 1 mg into the skin once a week.  Dispense: 3 mL; Refill: 0 -     Semaglutide  (2 MG/DOSE); Inject 2 mg as directed once a week.  Dispense: 3 mL; Refill: 6  Paroxysmal atrial fibrillation (HCC) Assessment & Plan: On eliquis , amio discontinued due to concerns for toxicity. Remains rate controlled.   Late onset Alzheimer's disease without behavioral disturbance [facility]) Assessment & Plan: Reviewed assisted living resources, does have home health services set up to help with medication and chores around the home. Continue.    Diagnostic Tests, Consultations, and Other Outstanding Issues I have reviewed the following data from this admission:    Discharge summary  : Yes  Discharge/admit labs : Yes  Imaging studies : Yes  Procedures/operative reports : Yes  This plan was discussed with the patient and questions were answered. There were no further concerns. There were no barriers to care. Follow up as indicated, or sooner should any new problems  arise, if conditions worsen, or if they are otherwise concerned.   Return in about 4 weeks (around 06/23/2024) for Chronic illness f/u.  Future Appointments  Date Time Provider Department Center  06/01/2024  3:35 PM Ronald Cockayne, NP [facility] None  07/02/2024  1:20 PM Hadassah Letters, MD [facility]  08/26/2024  2:30 PM Charlette Console, FNP [facility] None    Patient Instructions  Be sure to follow up with cardiology  You can use as needed tylenol  for back pain, ok to take every 8 hours. Do not take more than 3000mg  a day (or 3 packets total)   CMS Transitions code 16109  CMS Transitions code 16109  moderate complexity MDM (equivalent to EL4) face-to-face visit within 14 days.  Called within 2 business days of discharge high complexity MDM (equivalent to [facility]) face-to-face visit within 7 days.  Called within 2 business days of discharge

## 2024-05-26 NOTE — Patient Instructions (Addendum)
 Be sure to follow up with cardiology  You can use as needed tylenol  for back pain, ok to take every 8 hours. Do not take more than 3000mg  a day (or 3 packets total)

## 2024-05-27 ENCOUNTER — Telehealth: Payer: Self-pay | Admitting: Family

## 2024-05-27 NOTE — Telephone Encounter (Signed)
 Called to confirm/remind patient of their appointment at [facility] on 05/28/24.   Appointment:   [x] Confirmed  [] Left mess   [] No answer/No voice mail  [] VM Full/unable to leave message  [] Phone not in service  Patient reminded to bring all medications and/or complete list.  Confirmed patient has transportation. Gave directions, instructed to utilize valet parking.

## 2024-05-27 NOTE — Progress Notes (Unsigned)
 [facility] Note   Referring Physician: PCP: Hadassah Letters, MD Cardiologist: Belva Boyden, MD   Chief Complaint:    HPI:   [name] is a 89 y/o female with a history of skin cancer, DM, hyperlipidemia, HTN, thyroid  disease, GERD, cerebral aneurysm, atrial flutter, mild pulmonary HTN, PVD, sleep apnea, late onset alzheimer's disease and chronic heart failure.   Echo 12/11/22 showed an EF of 50-55% along with mild MR. Echo report from 10/31/21 reviewed and showed an EF of 45-50% along with mild MR. Echo report from 10/07/20 reviewed and showed an EF of 60-65% along with mild MR but without regional wall motion abnormalities.   LHC done 10/12/21:   Ost RCA to Prox RCA lesion is 99% stenosed.   Dist RCA lesion is 65% stenosed.   RPDA lesion is 60% stenosed.   1st Diag lesion is 99% stenosed.   Ost Cx to Prox Cx lesion is 25% stenosed.   Ost LAD to Prox LAD lesion is 25% stenosed.   There is mild left ventricular systolic dysfunction.   LV end diastolic pressure is mildly elevated.   The left ventricular ejection fraction is 35-45% by visual estimate.  Admitted 12/09/22 due to pneumonia.   She presents today for a follow-up visit with a chief complaint of moderate fatigue with minimal exertion. Describes this as chronic in nature. Has associated cough, SOB (improving), chest congestion, light-headedness and gradual weight gain along with this. Denies any difficulty sleeping, abdominal distention, palpitations, pedal edema or chest pain.     Review of Systems: [y] = yes, [ ]  = no   General: Weight gain [ ] ; Weight loss [ ] ; Anorexia [ ] ; Fatigue [ ] ; Fever [ ] ; Chills [ ] ; Weakness [ ]   Cardiac: Chest pain/pressure [ ] ; Resting SOB [ ] ; Exertional SOB [ ] ; Orthopnea [ ] ; Pedal Edema [ ] ; Palpitations [ ] ; Syncope [ ] ; Presyncope [ ] ; Paroxysmal nocturnal dyspnea[ ]   Pulmonary: Cough [ ] ; Wheezing[ ] ; Hemoptysis[ ] ; Sputum [ ] ; Snoring [ ]   GI: Vomiting[ ] ;  Dysphagia[ ] ; Melena[ ] ; Hematochezia [ ] ; Heartburn[ ] ; Abdominal pain [ ] ; Constipation [ ] ; Diarrhea [ ] ; BRBPR [ ]   GU: Hematuria[ ] ; Dysuria [ ] ; Nocturia[ ]   Vascular: Pain in legs with walking [ ] ; Pain in feet with lying flat [ ] ; Non-healing sores [ ] ; Stroke [ ] ; TIA [ ] ; Slurred speech [ ] ;  Neuro: Headaches[ ] ; Vertigo[ ] ; Seizures[ ] ; Paresthesias[ ] ;Blurred vision [ ] ; Diplopia [ ] ; Vision changes [ ]   Ortho/Skin: Arthritis [ ] ; Joint pain [ ] ; Muscle pain [ ] ; Joint swelling [ ] ; Back Pain [ ] ; Rash [ ]   Psych: Depression[ ] ; Anxiety[ ]   Heme: Bleeding problems [ ] ; Clotting disorders [ ] ; Anemia [ ]   Endocrine: Diabetes [ ] ; Thyroid  dysfunction[ ]    Past Medical History:  Diagnosis Date   Acute on chronic congestive heart failure (HCC) 10/07/2020   Arrhythmia    Atrial fibrillation and flutter (HCC) 01/06/2019   Plavix  added to eliquis  10-22 after nstemi       Last Assessment & Plan:    Marked facial hematoma post fall but ct of neck and head reportedly normal. No ha or ataxia now but is using a walker. On eliquis  and plavix      Atrial flutter (HCC) 01/2018   new onset    Basal cell carcinoma of back    Basal cell carcinoma of lip  Cerebral aneurysm    followed by [facility]   CHF (congestive heart failure) (HCC)    DDD (degenerative disc disease), lumbar    superior plate depression, L3 08/18/2014   Diabetes mellitus type II, controlled (HCC)    Diverticulosis    Dysrhythmia    Paroxysmal Supraventricular Tachycardia   Dysthymia    depression   GERD (gastroesophageal reflux disease)    History of falling 04/03/2022   History of meniscal tear    Humeral surgical neck fracture 05/16/2022   Hyperlipidemia    Hypertension    Hypokalemia 12/12/2022   Hypothyroidism    Late onset Alzheimer's disease with behavioral disturbance (HCC)    Leukocytosis 10/07/2020   Mild pulmonary hypertension (HCC)    Moderate asthma without complication 12/12/2022   Multifocal  pneumonia 12/09/2022   Overactive bladder    Peripheral vascular disease (HCC)    Puncture wound of forehead 06/11/2023   Seasonal allergic rhinitis    Severe sepsis (HCC) 02/07/2022   Sleep apnea     Current Outpatient Medications  Medication Sig Dispense Refill   albuterol  (PROVENTIL ) (2.5 MG/3ML) 0.083% nebulizer solution Take 3 mLs (2.5 mg total) by nebulization every 6 (six) hours as needed for wheezing or shortness of breath. 75 mL 3   apixaban  (ELIQUIS ) 5 MG TABS tablet Take 1 tablet (5 mg total) by mouth 2 (two) times daily. 180 tablet 3   aspirin  EC 81 MG tablet Take 1 tablet (81 mg total) by mouth daily for 7 days. Swallow whole. 7 tablet 0   clopidogrel  (PLAVIX ) 75 MG tablet Take 1 tablet (75 mg total) by mouth daily with breakfast. 90 tablet 0   donepezil  (ARICEPT ) 10 MG tablet TAKE ONE TABLET (10 MG) BY MOUTH AT BEDTIME 90 tablet 0   famotidine  (PEPCID ) 20 MG tablet Take 1 tablet (20 mg total) by mouth 2 (two) times daily before a meal. 60 tablet 0   gabapentin  (NEURONTIN ) 100 MG capsule Take 1 capsule (100 mg total) by mouth at bedtime.     levothyroxine  (SYNTHROID ) 88 MCG tablet TAKE 1 TABLET EVERY DAY ON EMPTY STOMACHWITH A GLASS OF WATER AT LEAST 30-60 MINBEFORE BREAKFAST 90 tablet 3   losartan  (COZAAR ) 25 MG tablet Take 0.5 tablets (12.5 mg total) by mouth daily. 30 tablet 0   nitroGLYCERIN  (NITROSTAT ) 0.4 MG SL tablet Place 1 tablet (0.4 mg total) under the tongue every 5 (five) minutes as needed for chest pain. 25 tablet 3   pravastatin  (PRAVACHOL ) 20 MG tablet Take 1 tablet (20 mg total) by mouth at bedtime. 90 tablet 3   Semaglutide ,0.25 or 0.5MG /DOS, (OZEMPIC , 0.25 OR 0.5 MG/DOSE,) 2 MG/1.5ML SOPN Inject 0.5 mg into the skin once a week. 6 mL 1   Tiotropium Bromide Monohydrate  (SPIRIVA  RESPIMAT) 1.25 MCG/ACT AERS Inhale into the lungs.     torsemide  (DEMADEX ) 20 MG tablet Take 1 tablet (20 mg total) by mouth daily as needed. For leg edema or sob 30 tablet 0    venlafaxine  XR (EFFEXOR -XR) 75 MG 24 hr capsule TAKE 1 CAPSULE BY MOUTH ONCE DAILY. 90 capsule 0   No current facility-administered medications for this visit.    Allergies  Allergen Reactions   Pantoprazole  Nausea And Vomiting   Metformin And Related Diarrhea      Social History   Socioeconomic History   Marital status: Widowed    Spouse name: Not on file   Number of children: 5   Years of education: Not on file   Highest  education level: Not on file  Occupational History   Not on file  Tobacco Use   Smoking status: Never   Smokeless tobacco: Never  Vaping Use   Vaping status: Never Used  Substance and Sexual Activity   Alcohol use: Not Currently   Drug use: Never   Sexual activity: Not Currently  Other Topics Concern   Not on file  Social History Narrative   Not on file   Social Drivers of Health   Financial Resource Strain: Low Risk  (05/20/2024)   Overall Financial Resource Strain (CARDIA)    Difficulty of Paying Living Expenses: Not hard at all  Food Insecurity: No Food Insecurity (05/20/2024)   Hunger Vital Sign    Worried About Running Out of Food in the Last Year: Never true    Ran Out of Food in the Last Year: Never true  Transportation Needs: No Transportation Needs (05/20/2024)   PRAPARE - Administrator, Civil Service (Medical): No    Lack of Transportation (Non-Medical): No  Physical Activity: Insufficiently Active (04/09/2023)   Exercise Vital Sign    Days of Exercise per Week: 4 days    Minutes of Exercise per Session: 20 min  Stress: No Stress Concern Present (04/09/2023)   [employer] of Occupational Health - Occupational Stress Questionnaire    Feeling of Stress : Not at all  Social Connections: Socially Isolated (05/14/2024)   Social Connection and Isolation Panel [NHANES]    Frequency of Communication with Friends and Family: More than three times a week    Frequency of Social Gatherings with Friends and Family: More than three  times a week    Attends Religious Services: Never    Database administrator or Organizations: No    Attends Banker Meetings: Never    Marital Status: Widowed  Intimate Partner Violence: Not At Risk (05/14/2024)   Humiliation, Afraid, Rape, and Kick questionnaire    Fear of Current or Ex-Partner: No    Emotionally Abused: No    Physically Abused: No    Sexually Abused: No      Family History  Problem Relation Age of Onset   Hypertension Mother    Heart disease Father    CAD Father    Heart disease Sister    Diabetes Sister    Diabetes Paternal Uncle    Sleep apnea Son        PHYSICAL EXAM: General:  Well appearing. No respiratory difficulty HEENT: normal Neck: supple. no JVD. Carotids 2+ bilat; no bruits. No lymphadenopathy or thyromegaly appreciated. Cor: PMI nondisplaced. Regular rate & rhythm. No rubs, gallops or murmurs. Lungs: clear Abdomen: soft, nontender, nondistended. No hepatosplenomegaly. No bruits or masses. Good bowel sounds. Extremities: no cyanosis, clubbing, rash, edema Neuro: alert & oriented x 3, cranial nerves grossly intact. moves all 4 extremities w/o difficulty. Affect pleasant.  ECG:   ASSESSMENT & PLAN:  1: Chronic heart failure with preserved ejection fraction without LVH/LAE - - NYHA class III - euvolemic today - weighing daily & home weight chart ranges from 169-172 pounds; reminded to call for an overnight weight gain of >2 pounds or a weekly weight gain of >5 pounds - weight up 5 pounds from last visit here 2 months ago - not adding additional salt to her food but does eat at the cafeteria for her meals at [facility] - advised to drink between 50-64 ounces of fluids daily - echo 12/11/22 showed an EF of 50-55% along with  mild MR.  - LHC done 10/12/21:   Ost RCA to Prox RCA lesion is 99% stenosed.   Dist RCA lesion is 65% stenosed.   RPDA lesion is 60% stenosed.   1st Diag lesion is 99% stenosed.   Ost Cx to Prox Cx lesion  is 25% stenosed.   Ost LAD to Prox LAD lesion is 25% stenosed.   There is mild left ventricular systolic dysfunction.   LV end diastolic pressure is mildly elevated.   The left ventricular ejection fraction is 35-45% by visual estimate. - metoprolol  succinate 100mg  daily - farxiga  10mg  daily - isosorbide  MN 30mg  daily - spironolactone  12.5mg  daily - torsemide  20mg  daily - saw pulmonology (Dgayli) 11/28/22 - BNP 12/11/22 was 521.5  2: HTN- - BP 108/82; home BP ranges from 133-170/81- 107; unsure of accuracy of some of these readings - saw PCP Curt Dover) 1/23/243 - BMP 01/16/20 reviewed and showed sodium 141, potassium 4.2, creatinine 0.96 and GFR 57  3: DM- - A1c 01/15/23 was 7.8% - glipizide  5mg  daily - home glucose was 143  4: Atrial fibrillation- - saw cardiology (Paraschos) 12/26/22 - amiodarone  300mg  daily - clopidogrel  75mg  daily - diltiazem  240mg  daily - apixaban  5mg  BID   Patient did not bring her medications nor a list. Each medication was verbally reviewed with the patient and she was encouraged to bring the bottles to every visit to confirm accuracy of list.  Return in 4 months, sooner if needed.    Charlette Console, FNP 05/27/24

## 2024-05-28 ENCOUNTER — Ambulatory Visit: Attending: Family | Admitting: Family

## 2024-05-28 ENCOUNTER — Encounter: Payer: Self-pay | Admitting: Family

## 2024-05-28 VITALS — BP 144/72 | HR 86 | Wt 164.4 lb

## 2024-05-28 DIAGNOSIS — I428 Other cardiomyopathies: Secondary | ICD-10-CM | POA: Diagnosis not present

## 2024-05-28 DIAGNOSIS — I4892 Unspecified atrial flutter: Secondary | ICD-10-CM | POA: Diagnosis not present

## 2024-05-28 DIAGNOSIS — Z79899 Other long term (current) drug therapy: Secondary | ICD-10-CM | POA: Insufficient documentation

## 2024-05-28 DIAGNOSIS — I272 Pulmonary hypertension, unspecified: Secondary | ICD-10-CM | POA: Insufficient documentation

## 2024-05-28 DIAGNOSIS — I252 Old myocardial infarction: Secondary | ICD-10-CM | POA: Diagnosis present

## 2024-05-28 DIAGNOSIS — G4730 Sleep apnea, unspecified: Secondary | ICD-10-CM | POA: Diagnosis not present

## 2024-05-28 DIAGNOSIS — I110 Hypertensive heart disease with heart failure: Secondary | ICD-10-CM | POA: Insufficient documentation

## 2024-05-28 DIAGNOSIS — E785 Hyperlipidemia, unspecified: Secondary | ICD-10-CM | POA: Insufficient documentation

## 2024-05-28 DIAGNOSIS — I10 Essential (primary) hypertension: Secondary | ICD-10-CM | POA: Diagnosis not present

## 2024-05-28 DIAGNOSIS — I480 Paroxysmal atrial fibrillation: Secondary | ICD-10-CM | POA: Insufficient documentation

## 2024-05-28 DIAGNOSIS — Z7901 Long term (current) use of anticoagulants: Secondary | ICD-10-CM | POA: Diagnosis not present

## 2024-05-28 DIAGNOSIS — Z7984 Long term (current) use of oral hypoglycemic drugs: Secondary | ICD-10-CM | POA: Insufficient documentation

## 2024-05-28 DIAGNOSIS — E1169 Type 2 diabetes mellitus with other specified complication: Secondary | ICD-10-CM | POA: Diagnosis not present

## 2024-05-28 DIAGNOSIS — Z7902 Long term (current) use of antithrombotics/antiplatelets: Secondary | ICD-10-CM | POA: Diagnosis not present

## 2024-05-28 DIAGNOSIS — K219 Gastro-esophageal reflux disease without esophagitis: Secondary | ICD-10-CM | POA: Insufficient documentation

## 2024-05-28 DIAGNOSIS — E1151 Type 2 diabetes mellitus with diabetic peripheral angiopathy without gangrene: Secondary | ICD-10-CM | POA: Insufficient documentation

## 2024-05-28 DIAGNOSIS — G301 Alzheimer's disease with late onset: Secondary | ICD-10-CM | POA: Diagnosis not present

## 2024-05-28 DIAGNOSIS — Z85828 Personal history of other malignant neoplasm of skin: Secondary | ICD-10-CM | POA: Diagnosis not present

## 2024-05-28 DIAGNOSIS — I482 Chronic atrial fibrillation, unspecified: Secondary | ICD-10-CM

## 2024-05-28 DIAGNOSIS — Z79890 Hormone replacement therapy (postmenopausal): Secondary | ICD-10-CM | POA: Insufficient documentation

## 2024-05-28 DIAGNOSIS — E079 Disorder of thyroid, unspecified: Secondary | ICD-10-CM | POA: Diagnosis not present

## 2024-05-28 DIAGNOSIS — F0280 Dementia in other diseases classified elsewhere without behavioral disturbance: Secondary | ICD-10-CM | POA: Diagnosis not present

## 2024-05-28 DIAGNOSIS — I5022 Chronic systolic (congestive) heart failure: Secondary | ICD-10-CM | POA: Diagnosis not present

## 2024-05-28 DIAGNOSIS — F419 Anxiety disorder, unspecified: Secondary | ICD-10-CM | POA: Insufficient documentation

## 2024-05-28 DIAGNOSIS — F32A Depression, unspecified: Secondary | ICD-10-CM | POA: Diagnosis not present

## 2024-05-28 DIAGNOSIS — E782 Mixed hyperlipidemia: Secondary | ICD-10-CM

## 2024-05-28 NOTE — Patient Instructions (Signed)
 Medication Changes:  No medication changes today!  Lab Work:  Go DOWN to LOWER LEVEL (LL) to have your blood work completed inside of [employer] office.  We will only call you if the results are abnormal or if the provider would like to make medication changes.   Follow-Up in: Please follow up with the [facility] in 3 months with Shawnee Dellen, FNP.  At the [facility], you and your health needs are our priority. We have a designated team specialized in the treatment of Heart Failure. This Care Team includes your primary Heart Failure Specialized Cardiologist (physician), Advanced Practice Providers (APPs- Physician Assistants and Nurse Practitioners), and Pharmacist who all work together to provide you with the care you need, when you need it.   You may see any of the following providers on your designated Care Team at your next follow up:  Dr. Jules Oar Dr. Peder Bourdon Dr. Alwin Baars Dr. Judyth Nunnery Shawnee Dellen, FNP Bevely Brush, RPH-CPP  Please be sure to bring in all your medications bottles to every appointment.   Need to Contact Us :  If you have any questions or concerns before your next appointment please send us  a message through [place] or call our office at [phone].    TO LEAVE A MESSAGE FOR THE NURSE SELECT OPTION 2, PLEASE LEAVE A MESSAGE INCLUDING: YOUR NAME DATE OF BIRTH CALL BACK NUMBER REASON FOR CALL**this is important as we prioritize the call backs  YOU WILL RECEIVE A CALL BACK THE SAME DAY AS LONG AS YOU CALL BEFORE 4:00 PM

## 2024-05-29 ENCOUNTER — Ambulatory Visit: Payer: Self-pay | Admitting: Family

## 2024-05-29 LAB — BASIC METABOLIC PANEL WITH GFR
BUN/Creatinine Ratio: 17 (ref 12–28)
BUN: 14 mg/dL (ref 8–27)
CO2: 20 mmol/L (ref 20–29)
Calcium: 9 mg/dL (ref 8.7–10.3)
Chloride: 96 mmol/L (ref 96–106)
Creatinine, Ser: 0.82 mg/dL (ref 0.57–1.00)
Glucose: 238 mg/dL — ABNORMAL HIGH (ref 70–99)
Potassium: 3.7 mmol/L (ref 3.5–5.2)
Sodium: 137 mmol/L (ref 134–144)
eGFR: 68 mL/min/{1.73_m2} (ref >59)

## 2024-05-29 LAB — MAGNESIUM: Magnesium: 1.9 mg/dL (ref 1.6–2.3)

## 2024-06-01 ENCOUNTER — Telehealth: Payer: Self-pay | Admitting: Pediatrics

## 2024-06-01 ENCOUNTER — Encounter: Payer: Self-pay | Admitting: Pediatrics

## 2024-06-01 ENCOUNTER — Ambulatory Visit: Attending: Cardiology | Admitting: Cardiology

## 2024-06-01 ENCOUNTER — Other Ambulatory Visit: Payer: Self-pay | Admitting: Pediatrics

## 2024-06-01 ENCOUNTER — Encounter: Payer: Self-pay | Admitting: Cardiology

## 2024-06-01 VITALS — BP 126/60 | HR 74 | Ht 66.0 in | Wt 165.4 lb

## 2024-06-01 DIAGNOSIS — Z8709 Personal history of other diseases of the respiratory system: Secondary | ICD-10-CM

## 2024-06-01 DIAGNOSIS — I2510 Atherosclerotic heart disease of native coronary artery without angina pectoris: Secondary | ICD-10-CM

## 2024-06-01 DIAGNOSIS — I480 Paroxysmal atrial fibrillation: Secondary | ICD-10-CM | POA: Diagnosis not present

## 2024-06-01 DIAGNOSIS — I071 Rheumatic tricuspid insufficiency: Secondary | ICD-10-CM

## 2024-06-01 DIAGNOSIS — I5022 Chronic systolic (congestive) heart failure: Secondary | ICD-10-CM

## 2024-06-01 DIAGNOSIS — R0602 Shortness of breath: Secondary | ICD-10-CM

## 2024-06-01 DIAGNOSIS — I340 Nonrheumatic mitral (valve) insufficiency: Secondary | ICD-10-CM

## 2024-06-01 MED ORDER — SPIRONOLACTONE 25 MG PO TABS: 12.5000 mg | ORAL_TABLET | Freq: Every day | ORAL | 3 refills | Status: DC

## 2024-06-01 MED ORDER — OZEMPIC (1 MG/DOSE) 2 MG/1.5ML ~~LOC~~ SOPN: 1.0000 mg | PEN_INJECTOR | SUBCUTANEOUS | 0 refills | Status: DC

## 2024-06-01 MED ORDER — SEMAGLUTIDE (2 MG/DOSE) 8 MG/3ML ~~LOC~~ SOPN: 2.0000 mg | PEN_INJECTOR | SUBCUTANEOUS | 6 refills | Status: DC

## 2024-06-01 NOTE — Assessment & Plan Note (Signed)
 Reviewed assisted living resources, does have home health services set up to help with medication and chores around the home. Continue.

## 2024-06-01 NOTE — Assessment & Plan Note (Signed)
 Recent admission for acute hypoxia thought to be secondary to cardiac etiology. Asymptomatic today. HD stable, well appearing on exam. Some concern about amiodarone  toxicity so this was discontinued. Status post left/right heart cath and coronary angiogram 5/27. 2 nonoverlapping drug-eluting stents placed to the RCA. She follows closely with cardiology. Currently on plavix  75mg , cozaar  25mg , nitrostat  prn, torsemide  20mg  prn.

## 2024-06-01 NOTE — Assessment & Plan Note (Signed)
 She is on ozempic  0.5mg , Continue titrating up.

## 2024-06-01 NOTE — Telephone Encounter (Signed)
 Copied from CRM [id number]. Topic: General - Call Back - No Documentation >> May 29, 2024  9:49 AM [place] wrote: Reason for CRM: pt called in because needs to get the number for the place that does sleep studies . If there is anyway that someone can give her a call back about this

## 2024-06-01 NOTE — Assessment & Plan Note (Signed)
 On eliquis , amio discontinued due to concerns for toxicity. Remains rate controlled.

## 2024-06-01 NOTE — Progress Notes (Signed)
 Cardiology Office Note   Date:  06/01/2024  ID:  Tahisha Hakim, DOB 02/15/1933, MRN [id number] PCP: Hadassah Letters, MD  [facility] Providers Cardiologist:  Belva Boyden, MD     History of Present Illness Alison Richardson is a 89 y.o. female with past medical history of coronary artery disease medically managed, persistent atrial fibrillation, HFpEF, valvular heart disease, type 2 diabetes, hypertension, hyperlipidemia, and hypothyroidism, who returns today for follow-up after recent hospitalization.   She previously been followed by [facility] cardiology.  Recommend imaging outlined below.  She underwent left heart catheterization in 09/2021 which showed ostial proximal RCA stenosis 99%, distal RCA stenosis 65%, RPDA stenosis 60%, D1 stenosis 90%, ostial left circumflex to proximal left circumflex 25% stenosis, and ostial LAD to proximal LAD 25% stenosis.  LV systolic function estimated at 35-45%.  Given complex lesions that were not ideal for PCI, medical management was recommended.  Echocardiogram in 10/2021 showed an EF of 45-50%, no RWMA, normal LV diastolic function, normal RV systolic function and size, mild mitral regurgitation.  Follow-up echoes have demonstrated normalization of LV systolic function.  She establish care with Dr. Gollan in 11/2023.  Echocardiogram in 12/2023 showed an EF of 60 to 65%, no RWMA, normal RV systolic function and size, mildly elevated PASP.  Mildly to moderately mitral regurgitation and mild to moderate tricuspid regurgitation.  She was admitted to [facility] 03/2024 with progressive dyspnea treated for acute hypoxic respiratory failure with sepsis secondary to pneumonia.  Was evaluated at her PCPs office on 05/05/2024 for routine follow-up.  Blood pressure was soft at 93/57.  Valsartan  was discontinued.  She was started on Ozempic .  She had recently been admitted from 5/16 - 05/09/2024 with continued lightheadedness and dizziness with positional changes.  Blood  pressure 101/59.  High-sensitivity troponin negative x 2.  Chest x-ray with bibasilar linear opacities left greater than right felt to favor atelectasis with stable cardiomegaly.  She received IV fluids with recommendation to discontinue diltiazem .  Uptitrating home blood pressure metoprolol  was reinitiated.  Evaluated by her PCP 05/12/2024 with reported episode of chest discomfort.  Blood pressure improved and was 123/73.  She did feel like abdominal was distended and bloated.  She was advised to take a lower dose of Toprol -XL 12.5 mg daily and torsemide  for weight gain.  She was continued on Ozempic .   She was admitted to [facility] on 05/13/2024 with an illness and onset of shortness of breath 85/21 when lying down.  On arrival EMS had already given Solu-Medrol  and DuoNebs.  She was found to be hypoxic at 87% on 4 L of supplemental oxygen.  In the setting she was transition to nasal cannula followed by BiPAP.  High-sensitivity troponin of 14 with a delta troponin of 108.  D-dimer 1.2, lactic acid 2.8 trended 3.1, BNP 1890, hemoglobin 12.3, WBCs of 24.4.  COVID, influenza and RSV were negative.  Chest x-ray with interval development of interstitial pulmonary edema versus stable cardiomegaly.  She was given nitro and subsequently transition to a nitro drip as well as IV Lasix  40 mg due to she was also placed on a heparin  drip.  Pulmonary was consulted.  There was concern that it may have been amiodarone  associated lung disease.  She had no evidence of COPD on prior spirometry.  Ultimately amiodarone  was discontinued.  She underwent PCI on 5/27 with 2 DES placed to the right coronary artery.  She was started on aspirin , Plavix , and apixaban  with plans to discontinue aspirin  after 1  week.  By reevaluation on 5/28 patient had no prior oxygen requirements reported dyspnea completely resolved.  She was able to ambulate about the floor without issue without evidence of desaturation.  Right and left heart catheterization  revealed normal right and left heart filling pressures.  Pulmonary suspect hypoxic respiratory failure largely cardiac in origin.  By 528 she was anxious to return home.  Home health arrangements were made prior to discharge.  She was also to follow-up with advanced heart failure on discharge.  She followed up with advanced heart failure on 06/04/2022 chief complaint of fatigue.  Associated shortness of breath, cough, runny nose, back pain, neuropathy, dizziness, but denied any chest pain, or peripheral edema.  Euvolemic on exam with NYHA class III symptoms.  No escalation of GDMT was made she was sent for labs.  The patient stated that she had had side effects with SGLT2 inhibitors in the past but could not remember the side effect.  With low blood pressure with orthostatic symptoms she did not tolerate MRA or beta-blockers.  She returns to clinic today stating that overall she has been doing well since being discharged from the hospital.  She continues to have shortness of breath that she states is little worse prior to hospitalization.  Denies any weight gain.  States that she has some occasional fluid in her abdomen but she never swells in her ankles.  Denies any chest discomfort, chest tightness, palpitations.  States that she has been compliant with her current medications.  States that she has not missed any doses of her apixaban  and denies any bleeding with no blood noted in her urine or stool.  Previously she had dropped her aspirin  therapy 7 days after discharge from the hospital.  ROS: 10 point review of system has been reviewed and considered negative except what has been listed in the HPI.  Studies Reviewed EKG Interpretation Date/Time:  Monday June 01 2024 15:45:55 EDT Ventricular Rate:  74 PR Interval:  172 QRS Duration:  108 QT Interval:  486 QTC Calculation: 539 R Axis:   -51  Text Interpretation: Normal sinus rhythm Left axis deviation Minimal voltage criteria for LVH, may be  normal variant ( Cornell product ) T wave abnormality, consider anterolateral ischemia No acute changes Confirmed by Ronald Cockayne ([id number]) on 06/01/2024 4:50:26 PM    LHC 05/19/2024 Conclusions: Relatively stable appearance of severe two-vessel CAD, including chronic total occlusion of D1 branch and sequential 99% proximal, 40-50% mid, and 70-80% distal RCA stenoses.  There are also moderate, nonobstructive lesions in the ostial/proximal LAD, proximal/mid LCx, and RPDA/RPAV branches. Normal left and right heart filling pressures (mean RA 4, RV 40/5, PCWP 15, LVEDP 15 mmHg).  Prominent V waves noted on PCWP tracing suggest significant mitral regurgitation. Mild pulmonary hypertension (PA 36/15, mean 22 mmHg). Normal Fick cardiac output/index (CO 4.9 L/min, CI 2.7 L/min/m). Successful PCI to proximal and distal RCA stenoses using nonoverlapping Onyx Frontier 3.0 x 15 mm (proximal) and 2.75 x 15 mm (distal) drug-eluting stents with 0% residual stenosis and TIMI-3 flow.   Recommendations: If no evidence of bleeding or vascular injury, resume apixaban  5 mg twice daily this evening.  Anticipate therapy with apixaban , clopidogrel , and aspirin  for 1 week, after which time aspirin  can be stopped.  Continue apixaban  and clopidogrel  therapy for up to 12 months, as tolerated (could switch clopidogrel  to aspirin  at 6 months if there is concern for high bleeding risk). Maintain net even fluid balance. Escalate goal-directed medical therapy as tolerated.  Aggressive secondary prevention of coronary artery disease.  2D echo 05/14/2024 1. Left ventricular ejection fraction, by estimation, is 40 to 45%. The  left ventricle has mild to moderately decreased function. The left  ventricle demonstrates global hypokinesis. Left ventricular diastolic  parameters are indeterminate.   2. Right ventricular systolic function is normal. The right ventricular  size is moderately enlarged. There is moderately elevated pulmonary  artery  systolic pressure. The estimated right ventricular systolic pressure is  54.3 mmHg.   3. The mitral valve is normal in structure. Mild mitral valve  regurgitation.   4. Tricuspid valve regurgitation is mild to moderate.   5. The aortic valve is tricuspid. Aortic valve regurgitation is not  visualized.   6. The inferior vena cava is normal in size with greater than 50%  respiratory variability, suggesting right atrial pressure of 3 mmHg.   2D echo 12/31/2023: 1. Left ventricular ejection fraction, by estimation, is 60 to 65%. The  left ventricle has normal function. The left ventricle has no regional  wall motion abnormalities. Left ventricular diastolic parameters are  indeterminate.   2. Right ventricular systolic function is normal. The right ventricular  size is normal. There is mildly elevated pulmonary artery systolic  pressure. The estimated right ventricular systolic pressure is 40.8 mmHg.   3. Left atrial size was mild to moderately dilated.   4. The mitral valve is normal in structure. Mild to moderate mitral valve  regurgitation. No evidence of mitral stenosis.   5. Tricuspid valve regurgitation is mild to moderate.   6. The aortic valve has an indeterminant number of cusps. Aortic valve  regurgitation is not visualized. No aortic stenosis is present.   7. The inferior vena cava is normal in size with greater than 50%  respiratory variability, suggesting right atrial pressure of 3 mmHg.    2D echo 04/21/2023: 1. Left ventricular ejection fraction, by estimation, is 55 to 60%. Left  ventricular ejection fraction by PLAX is 67 %. The left ventricle has  normal function. The left ventricle has no regional wall motion  abnormalities. Left ventricular diastolic  parameters are indeterminate.   2. Right ventricular systolic function is normal. The right ventricular  size is normal. There is mildly elevated pulmonary artery systolic  pressure. The estimated right  ventricular systolic pressure is 38.3 mmHg.   3. The mitral valve is normal in structure. Moderate mitral valve  regurgitation. No evidence of mitral stenosis.   4. Tricuspid valve regurgitation is moderate to severe.   5. The aortic valve is tricuspid. Aortic valve regurgitation is not  visualized. No aortic stenosis is present.   6. The inferior vena cava is normal in size with greater than 50%  respiratory variability, suggesting right atrial pressure of 3 mmHg.    2D echo 12/11/2022: 1. Left ventricular ejection fraction, by estimation, is 50 to 55%. The  left ventricle has low normal function. The left ventricle has no regional  wall motion abnormalities. Left ventricular diastolic parameters were  normal.   2. Right ventricular systolic function is normal. The right ventricular  size is normal.   3. The mitral valve is normal in structure. Mild mitral valve  regurgitation. No evidence of mitral stenosis.   4. The aortic valve is normal in structure. Aortic valve regurgitation is  not visualized. No aortic stenosis is present.   5. The inferior vena cava is normal in size with greater than 50%  respiratory variability, suggesting right atrial pressure of 3 mmHg.  2D echo 10/31/2021: 1. Left ventricular ejection fraction, by estimation, is 45 to 50%. The  left ventricle has mildly decreased function. The left ventricle has no  regional wall motion abnormalities. Left ventricular diastolic parameters  were normal.   2. Right ventricular systolic function is normal. The right ventricular  size is normal.   3. The mitral valve is normal in structure. Mild mitral valve  regurgitation. No evidence of mitral stenosis.   4. The aortic valve is normal in structure. Aortic valve regurgitation is  not visualized. No aortic stenosis is present.   5. The inferior vena cava is normal in size with greater than 50%  respiratory variability, suggesting right atrial pressure of 3 mmHg.     LHC 10/12/2021:   Ost RCA to Prox RCA lesion is 99% stenosed.   Dist RCA lesion is 65% stenosed.   RPDA lesion is 60% stenosed.   1st Diag lesion is 99% stenosed.   Ost Cx to Prox Cx lesion is 25% stenosed.   Ost LAD to Prox LAD lesion is 25% stenosed.   There is mild left ventricular systolic dysfunction.   LV end diastolic pressure is mildly elevated.   The left ventricular ejection fraction is 35-45% by visual estimate.   89 year old female with known chronic nonvalvular atrial fibrillation hypertension hyperlipidemia with acute non-ST elevation myocardial infarction   Cardiac catheterization showing mild inferior hypokinesis and apical hypokinesis with ejection fraction of 40 to 45%   Severe and/or critical stenosis of ostial right coronary artery and moderate atherosclerosis of distal right coronary artery and PDA complex in nature with good collaterals from left anterior descending artery Mild atherosclerosis of left anterior descending artery and circumflex artery   After discussion with cardiovascular team would best be medically manage at this time due to complex lesions and not ideal for PCI and stent placement   Plan Isosorbide  for better collateral flow 2.  Increase beta-blocker for better heart rate control of atrial fibrillation with a goal heart rate between 60 and 90 bpm 3.  Reinstate anticoagulation for further risk reduction and stroke with atrial fibrillation 4.  Single antiplatelet therapy with Plavix  5.  Hypertension control and high intensity cholesterol therapy 6.  Cardiac rehabilitation   2D echo 10/07/2020: 1. Left ventricular ejection fraction, by estimation, is 60 to 65%. The  left ventricle has normal function. The left ventricle has no regional  wall motion abnormalities. Left ventricular diastolic parameters are  consistent with Grade I diastolic  dysfunction (impaired relaxation).   2. Right ventricular systolic function is normal. The right  ventricular  size is normal.   3. The mitral valve is grossly normal. Mild mitral valve regurgitation.   4. The aortic valve is normal in structure. Aortic valve regurgitation is  trivial.    2D echo 09/29/2019: 1. Left ventricular ejection fraction, by visual estimation, is 60 to  65%. The left ventricle has normal function. Left ventricular septal wall  thickness was mildly increased. Mildly increased left ventricular  posterior wall thickness. There is mildly  increased left ventricular hypertrophy.   2. Global right ventricle has normal systolic function.The right  ventricular size is mildly enlarged. No increase in right ventricular wall  thickness.   3. Left atrial size was mildly dilated.   4. Right atrial size was normal.   5. The mitral valve is grossly normal. Mild mitral valve regurgitation.   6. The tricuspid valve is grossly normal. Tricuspid valve regurgitation  is mild.   7. The  aortic valve is tricuspid Aortic valve regurgitation was not  visualized by color flow Doppler.   8. The pulmonic valve was not well visualized. Pulmonic valve  regurgitation is trivial by color flow Doppler.   9. Moderately elevated pulmonary artery systolic pressure.  10. No shunts by color flow doppler.   Risk Assessment/Calculations  CHA2DS2-VASc Score = 7   This indicates a 11.2% annual risk of stroke. The patient's score is based upon: CHF History: 1 HTN History: 1 Diabetes History: 1 Stroke History: 0 Vascular Disease History: 1 Age Score: 2 Gender Score: 1            Physical Exam VS:  BP 126/60 (BP Location: Left Arm, Patient Position: Sitting, Cuff Size: Normal)   Pulse 74   Ht 5\' 6"  (1.676 m)   Wt 165 lb 6.4 oz (75 kg)   SpO2 95%   BMI 26.70 kg/m    Wt Readings from Last 3 Encounters:  06/01/24 165 lb 6.4 oz (75 kg)  05/28/24 164 lb 6 oz (74.6 kg)  05/26/24 164 lb 12.8 oz (74.8 kg)    GEN: Well nourished, well developed in no acute distress NECK: No JVD; No  carotid bruits CARDIAC: RRR, no murmurs, rubs, gallops RESPIRATORY:  Clear to auscultation without rales, wheezing or rhonchi  ABDOMEN: Soft, non-tender, non-distended EXTREMITIES:  No edema; No deformity   ASSESSMENT AND PLAN Coronary artery disease involving native coronary arteries with recent NSTEMI when she presented to the hospital with shortness of breath and chest and left shoulder pain with mildly elevated high-sensitivity troponins cardiac catheterization showed fairly stable coronary anatomy with CTO of a small high diagonal branch and severe proximal and distal RCA with antegrade collateral flow.  She underwent successful PCI/DES to the proximal and distal RCA.  Right cath site remained stable.  She was continued on triple therapy with aspirin , apixaban , clopidogrel  for 1 week, after that time aspirin  was being discontinued.  She was recommended continuation of apixaban  and clopidogrel  for minimum of 12 months as tolerated if there are concerns with high risk for bleeding clopidogrel  can be switched back to aspirin  at 6 months.  EKG today reveals sinus rhythm with rate of 74 with left axis deviation with no acute changes.  She is also continued on pravastatin  20 mg daily where LDLs remained at goal.  Given advanced age and concern for side effects she was not switched to high intensity statin.  She has been referred to cardiac rehab.  Postprocedure labs were drawn by her PCP on 05/28/2024.  Electrolytes and kidney function remained stable.  Chronic HFmrEF with the last LVEF of 40-45%.  Right and left heart catheterization showed normal left and right heart filling pressures and mild pulmonary hypertension with normal cardiac output/index.  There was no addition of beta-blocker therapy in the setting of borderline low resting heart rate and COPD exacerbation during recent hospitalization.  Will continue to remain off of beta-blocker at this time.  She has been continued on losartan  12.5 mg daily,  torsemide  to 20 mg as needed daily and starting spironolactone  12.5 mg daily today to escalate GDMT as tolerated by blood pressure and kidney function.  She will have an updated BMP in 2 weeks and a repeat limited echocardiogram in 3 months.  Paroxysmal atrial fibrillation she has noted to be in sinus rhythm on EKG today with a rate of 74, left axis deviation, T wave abnormality, LVH, with no acute change.  She has been continued on apixaban   5 mg twice daily for CHA2DS2-VASc score of at least 7 for stroke prophylaxis.  Previously she was on amiodarone  therapy that was discontinued for concern of possible amiodarone  lung toxicity and QT prolongation.  Valvular heart disease with echocardiogram in 12/2023 showed mild to moderate mitral and tricuspid regurgitation.  Repeat echocardiogram in 05/14/2024 revealed mild mitral regurgitation and mild to moderate TR.  Will continue to follow with surveillance studies.  Recently had acute hypoxic respiratory failure that was likely multifactorial from heart failure and COPD.  Concern for amiodarone  toxicity with amiodarone  was discontinued.  She had rapid improvement with heart failure/COPD therapies.  Right and left heart catheterization showed normal left and right heart filling pressures.  Amiodarone  was discontinued on discharge.  Oxygen saturations have improved and she was no longer required supplemental oxygen therapy prior to discharge.  Continued with shortness of breath today likely multifactorial.   Cardiac Rehabilitation Eligibility Assessment  The patient is ready to start cardiac rehabilitation from a cardiac standpoint.       Dispo: Patient to return to clinic to see me/APP in 6 weeks or sooner if needed  Signed, Nuri Larmer, NP

## 2024-06-01 NOTE — Patient Instructions (Signed)
 Medication Instructions:  Your physician recommends the following medication changes.  START TAKING: Spironolactone  12.5 mg daily  *If you need a refill on your cardiac medications before your next appointment, please call your pharmacy*  Lab Work: Your provider would like for you to return in 2 weeks to have the following labs drawn: BMP.   Please go to [facility] [address] ([facility] [address], [place] [address] You do not need an appointment.  They are open from 8 am- 4:30 pm.  Lunch from 1:00 pm- 2:00 pm You do not need to be fasting.   You may also go to one of the following [facility]:  [address] [place], [place] [address] Phone: [phone] Lab hours: Mon-Fri 8 am- 5 pm    Lunch 12 pm- 1 pm  [address] [place],  [place]  [address]  [place]  Phone: [phone] Lab hours: 7 am- 4 pm Lunch 12 pm-1 pm   [address] [place],  [place]  [address]  [place]  Phone: [phone] Lab hours: Mon-Fri 8 am- 5 pm    Lunch 12 pm- 1 pm  If you have labs (blood work) drawn today and your tests are completely normal, you will receive your results only by: MyChart Message (if you have MyChart) OR A paper copy in the mail If you have any lab test that is abnormal or we need to change your treatment, we will call you to review the results.  Testing/Procedures: Your physician has requested that you have an echocardiogram. Echocardiography is a painless test that uses sound waves to create images of your heart. It provides your doctor with information about the size and shape of your heart and how well your heart's chambers and valves are working.   You may receive an ultrasound enhancing agent through an IV if needed to better visualize your heart during the echo. This procedure takes approximately one hour.  There are no restrictions for this procedure.  This will take place at [address] [facility]) [address], [place] [address]  Please note: We  ask at that you not bring children with you during ultrasound (echo/ vascular) testing. Due to room size and safety concerns, children are not allowed in the ultrasound rooms during exams. Our front office staff cannot provide observation of children in our lobby area while testing is being conducted. An adult accompanying a patient to their appointment will only be allowed in the ultrasound room at the discretion of the ultrasound technician under special circumstances. We apologize for any inconvenience.   Follow-Up: At [facility], you and your health needs are our priority.  As part of our continuing mission to provide you with exceptional heart care, our providers are all part of one team.  This team includes your primary Cardiologist (physician) and Advanced Practice Providers or APPs (Physician Assistants and Nurse Practitioners) who all work together to provide you with the care you need, when you need it.  Your next appointment:   6 week(s)  Provider:   Belva Boyden, MD or Ronald Cockayne, NP

## 2024-06-02 ENCOUNTER — Ambulatory Visit: Admitting: Nurse Practitioner

## 2024-06-02 NOTE — Telephone Encounter (Signed)
 Labs in date  Requested Prescriptions  Pending Prescriptions Disp Refills   venlafaxine  XR (EFFEXOR -XR) 75 MG 24 hr capsule [Pharmacy Med Name: VENLAFAXINE  HCL ER 75 MG CAP] 90 capsule 0    Sig: TAKE 1 CAPSULE BY MOUTH ONCE DAILY.     Psychiatry: Antidepressants - SNRI - desvenlafaxine & venlafaxine  Failed - 06/02/2024  2:31 PM      Failed - Lipid Panel in normal range within the last 12 months    Cholesterol, Total  Date Value Ref Range Status  04/16/2023 211 (H) 100 - 199 mg/dL Final   Cholesterol  Date Value Ref Range Status  05/19/2024 152 0 - 200 mg/dL Final   LDL Chol Calc (NIH)  Date Value Ref Range Status  04/16/2023 110 (H) 0 - 99 mg/dL Final   LDL Cholesterol  Date Value Ref Range Status  05/19/2024 66 0 - 99 mg/dL Final    Comment:           Total Cholesterol/HDL:CHD Risk Coronary Heart Disease Risk Table                     Men   Women  1/2 Average Risk   3.4   3.3  Average Risk       5.0   4.4  2 X Average Risk   9.6   7.1  3 X Average Risk  23.4   11.0        Use the calculated Patient Ratio above and the CHD Risk Table to determine the patient's CHD Risk.        ATP III CLASSIFICATION (LDL):  <100     mg/dL   Optimal  361-443  mg/dL   Near or Above                    Optimal  130-159  mg/dL   Borderline  154-008  mg/dL   High  >676     mg/dL   Very High Performed at [facility], [address] [place], [place] [address]    HDL  Date Value Ref Range Status  05/19/2024 68 >40 mg/dL Final  32/67/1245 76 >80 mg/dL Final   Triglycerides  Date Value Ref Range Status  05/19/2024 92 <150 mg/dL Final         Passed - Cr in normal range and within 360 days    Creatinine, Ser  Date Value Ref Range Status  05/28/2024 0.82 0.57 - 1.00 mg/dL Final         Passed - Completed PHQ-2 or PHQ-9 in the last 360 days      Passed - Last BP in normal range    BP Readings from Last 1 Encounters:  06/01/24 126/60         Passed - Valid  encounter within last 6 months    Recent Outpatient Visits           1 week ago Chronic diastolic CHF (congestive heart failure) (HCC)   [place] [facility] Hadassah Letters, MD   3 weeks ago Chest discomfort   [place] [facility] Hadassah Letters, MD   4 weeks ago Type 2 diabetes mellitus with peripheral neuropathy [facility])   [place] [facility] Aileen Alexanders, NP   1 month ago Leukocytosis, unspecified type   [place] [facility] Aileen Alexanders, NP   1 month ago Other fatigue   [place] [facility]  Hadassah Letters, MD       Future Appointments             In 1 month Ronald Cockayne, NP [facility]

## 2024-06-02 NOTE — Telephone Encounter (Signed)
 Called and provided patient with the number she requested

## 2024-06-17 ENCOUNTER — Ambulatory Visit: Payer: Self-pay | Admitting: Cardiology

## 2024-06-17 LAB — BASIC METABOLIC PANEL WITH GFR
BUN/Creatinine Ratio: 19 (ref 12–28)
BUN: 16 mg/dL (ref 8–27)
CO2: 19 mmol/L — ABNORMAL LOW (ref 20–29)
Calcium: 9.5 mg/dL (ref 8.7–10.3)
Chloride: 96 mmol/L (ref 96–106)
Creatinine, Ser: 0.83 mg/dL (ref 0.57–1.00)
Glucose: 184 mg/dL — ABNORMAL HIGH (ref 70–99)
Potassium: 4.4 mmol/L (ref 3.5–5.2)
Sodium: 135 mmol/L (ref 134–144)
eGFR: 67 mL/min/{1.73_m2} (ref >59)

## 2024-06-17 NOTE — Progress Notes (Signed)
 Kidney function electrolytes remained stable.  Continue current medication regimen with no changes at this time.

## 2024-06-18 ENCOUNTER — Ambulatory Visit: Attending: Cardiology

## 2024-06-18 DIAGNOSIS — R0602 Shortness of breath: Secondary | ICD-10-CM | POA: Diagnosis not present

## 2024-06-18 LAB — ECHOCARDIOGRAM LIMITED: Area-P 1/2: 4.31 cm2

## 2024-06-19 NOTE — Progress Notes (Signed)
 Heart squeeze was estimated at 60 to 65% which is normal function, no wall motion abnormalities were noted, mild to moderate leakage noted in the mitral valve, moderate leakage noted in the tricuspid valve.  Unchanged from prior study.

## 2024-06-23 ENCOUNTER — Telehealth: Payer: Self-pay

## 2024-06-23 ENCOUNTER — Other Ambulatory Visit: Payer: Self-pay

## 2024-06-23 NOTE — Telephone Encounter (Signed)
 Copied from CRM [id number]. Topic: Clinical - Prescription Issue >> Jun 23, 2024 12:32 PM Wess RAMAN wrote: Reason for CRM: Patient states her pharmacy, [facility] will not refill Semaglutide , 1 MG/DOSE, (OZEMPIC , 1 MG/DOSE,) 2 MG/1.5ML SOPN. The stated the medication will need to go through [facility] first.    Callback #: [id number]

## 2024-06-23 NOTE — Telephone Encounter (Signed)
 Contacted [facility] to get clarification on patient's message. Per [facility], they do not care this dose of medication so it was transferred to [facility].   Called patient and she states that she has not heard anything from [facility] and has never used them. Patient would like the Ozempic  to be sent to [facility] on [address] in [place] (added to patient's chart).

## 2024-06-24 ENCOUNTER — Other Ambulatory Visit: Payer: Self-pay | Admitting: Pediatrics

## 2024-06-24 ENCOUNTER — Other Ambulatory Visit: Payer: Self-pay

## 2024-06-24 DIAGNOSIS — E1122 Type 2 diabetes mellitus with diabetic chronic kidney disease: Secondary | ICD-10-CM

## 2024-06-24 MED ORDER — SEMAGLUTIDE (2 MG/DOSE) 8 MG/3ML ~~LOC~~ SOPN: 2.0000 mg | PEN_INJECTOR | SUBCUTANEOUS | 6 refills | Status: DC

## 2024-06-24 MED ORDER — LOSARTAN POTASSIUM 25 MG PO TABS
12.5000 mg | ORAL_TABLET | Freq: Every day | ORAL | 3 refills | Status: DC
Start: 2024-06-24 — End: 2024-07-14

## 2024-06-24 MED ORDER — OZEMPIC (1 MG/DOSE) 2 MG/1.5ML ~~LOC~~ SOPN: 1.0000 mg | PEN_INJECTOR | SUBCUTANEOUS | 0 refills | Status: DC

## 2024-06-24 NOTE — Progress Notes (Signed)
 Sending 1mg  and 2mg  ozempic  to [facility] per pt request  Hadassah SHAUNNA Nett, MD

## 2024-07-02 ENCOUNTER — Ambulatory Visit: Admitting: Pediatrics

## 2024-07-03 ENCOUNTER — Ambulatory Visit: Payer: Self-pay

## 2024-07-03 NOTE — Telephone Encounter (Signed)
 Call with patient. We discussed that EMS has checked patient out and they did not have any indication for EMS transport to the hospital. We then discussed that we have no openings today in our office but might be able to get her seen in another clinic but she was not interested in that option. She mentions she has been able to take Pepto-Bismal and keep in down and is feeling slightly better. We went over ED precautions if she continues to have symptoms and is unable to keep food/drinks down she should be seen on a more urgent basis. She agrees to self monitor for now and be seen if needed.

## 2024-07-03 NOTE — Telephone Encounter (Signed)
  Called 911 at 8:55 AM Patient vomited again while waiting on EMS to arrive. Emergency Personnel arrived at the patient at 9:10 AM   FYI Only or Action Required?: FYI only for provider.  Patient was last seen in primary care on 05/26/2024 by [place]old Hadassah SQUIBB, MD.  Called Nurse Triage reporting Vomiting.  Symptoms began yesterday.  Interventions attempted: Nothing.  Symptoms are: rapidly worsening.  Triage Disposition: Go to ED Now (or PCP Triage)  Patient/caregiver understands and will follow disposition?: Yes                        Copied from CRM [id number]. Topic: Clinical - Red Word Triage >> Jul 03, 2024  8:40 AM Alison Richardson wrote: Red Word that prompted transfer to Nurse Triage: Patient has been having nausea and vomiting since yesterday. Took her BP it was 121/75 says her heart rate is 97/134. Reason for Disposition  [1] MODERATE to SEVERE vomiting (e.g., 3 or more times/day) AND [2] age > 60 years  Answer Assessment - Initial Assessment Questions 1. VOMITING SEVERITY: How many times have you vomited in the past 24 hours?      4-5 2. ONSET: When did the vomiting begin?      yesterday 3. FLUIDS: What fluids or food have you vomited up today? Have you been able to keep any fluids down?     No---vomited Ginger Ale up now 4. ABDOMEN PAIN: Are your having any abdomen pain? If Yes : How bad is it and what does it feel like? (e.g., crampy, dull, intermittent, constant)      No 5. DIARRHEA: Is there any diarrhea? If Yes, ask: How many times today?      No 6. CONTACTS: Is there anyone else in the family with the same symptoms?      N/A 7. CAUSE: What do you think is causing your vomiting?     Unsure 8. HYDRATION STATUS: Any signs of dehydration? (e.g., dry mouth [not only dry lips], too weak to stand) When did you last urinate?     Yes per patient because she can't eat or drink anything 9. OTHER SYMPTOMS: Do you have any other  symptoms? (e.g., fever, headache, vertigo, vomiting blood or coffee grounds, recent head injury)     Tachycardia    96% on Oxygen  124 heart rate During triage 132 heart rate at 8:50 AM 07/03/2024 142 heart rate at 8:55 AM 07/03/2024  Patient vomited 4 times yesterday and once this morning.  Patient states she feels dehydrated because she hasn't been able to eat or drink anything. She tried some Ginger Ale and was unable to keep that down this morning. Patient walked across the room and states that she got very out of breath and breathless & at that time her heart rate was 142. 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 Patient was home alone at this time. With her medical history of Afib and PSVT w/vomiting & signs of dehydration (unable to keep any liquids or food down) 911 was called for this patient to take her to the Emergency Room.  Called 911 at 8:55 AM Patient vomited again while waiting on EMS to arrive. Emergency Personnel arrived at the patient at 9:10 AM  Protocols used: Vomiting-A-AH

## 2024-07-04 ENCOUNTER — Other Ambulatory Visit: Payer: Self-pay

## 2024-07-04 ENCOUNTER — Emergency Department

## 2024-07-04 ENCOUNTER — Inpatient Hospital Stay

## 2024-07-04 ENCOUNTER — Inpatient Hospital Stay
Admission: EM | Admit: 2024-07-04 | Discharge: 2024-07-14 | DRG: 480 | Disposition: A | Discharge status: Skilled Nursing Facility | Source: Skilled Nursing Facility | Attending: Internal Medicine | Admitting: Internal Medicine

## 2024-07-04 DIAGNOSIS — I4892 Unspecified atrial flutter: Secondary | ICD-10-CM | POA: Diagnosis present

## 2024-07-04 DIAGNOSIS — Z7985 Long-term (current) use of injectable non-insulin antidiabetic drugs: Secondary | ICD-10-CM

## 2024-07-04 DIAGNOSIS — Z7982 Long term (current) use of aspirin: Secondary | ICD-10-CM

## 2024-07-04 DIAGNOSIS — W19XXXA Unspecified fall, initial encounter: Secondary | ICD-10-CM

## 2024-07-04 DIAGNOSIS — I671 Cerebral aneurysm, nonruptured: Secondary | ICD-10-CM | POA: Diagnosis present

## 2024-07-04 DIAGNOSIS — Z794 Long term (current) use of insulin: Secondary | ICD-10-CM

## 2024-07-04 DIAGNOSIS — Z66 Do not resuscitate: Secondary | ICD-10-CM | POA: Diagnosis present

## 2024-07-04 DIAGNOSIS — F0283 Dementia in other diseases classified elsewhere, unspecified severity, with mood disturbance: Secondary | ICD-10-CM | POA: Diagnosis present

## 2024-07-04 DIAGNOSIS — Z01810 Encounter for preprocedural cardiovascular examination: Secondary | ICD-10-CM

## 2024-07-04 DIAGNOSIS — Y92009 Unspecified place in unspecified non-institutional (private) residence as the place of occurrence of the external cause: Secondary | ICD-10-CM | POA: Diagnosis not present

## 2024-07-04 DIAGNOSIS — F02811 Dementia in other diseases classified elsewhere, unspecified severity, with agitation: Secondary | ICD-10-CM | POA: Diagnosis present

## 2024-07-04 DIAGNOSIS — J45909 Unspecified asthma, uncomplicated: Secondary | ICD-10-CM | POA: Diagnosis present

## 2024-07-04 DIAGNOSIS — I2510 Atherosclerotic heart disease of native coronary artery without angina pectoris: Secondary | ICD-10-CM | POA: Diagnosis present

## 2024-07-04 DIAGNOSIS — E1151 Type 2 diabetes mellitus with diabetic peripheral angiopathy without gangrene: Secondary | ICD-10-CM | POA: Diagnosis present

## 2024-07-04 DIAGNOSIS — K5900 Constipation, unspecified: Secondary | ICD-10-CM | POA: Diagnosis not present

## 2024-07-04 DIAGNOSIS — Y92120 Kitchen in nursing home as the place of occurrence of the external cause: Secondary | ICD-10-CM | POA: Diagnosis not present

## 2024-07-04 DIAGNOSIS — E1142 Type 2 diabetes mellitus with diabetic polyneuropathy: Secondary | ICD-10-CM | POA: Diagnosis present

## 2024-07-04 DIAGNOSIS — F0280 Dementia in other diseases classified elsewhere without behavioral disturbance: Secondary | ICD-10-CM | POA: Diagnosis present

## 2024-07-04 DIAGNOSIS — R112 Nausea with vomiting, unspecified: Secondary | ICD-10-CM | POA: Diagnosis present

## 2024-07-04 DIAGNOSIS — J952 Acute pulmonary insufficiency following nonthoracic surgery: Secondary | ICD-10-CM | POA: Diagnosis not present

## 2024-07-04 DIAGNOSIS — E559 Vitamin D deficiency, unspecified: Secondary | ICD-10-CM | POA: Diagnosis present

## 2024-07-04 DIAGNOSIS — I110 Hypertensive heart disease with heart failure: Secondary | ICD-10-CM | POA: Diagnosis present

## 2024-07-04 DIAGNOSIS — D649 Anemia, unspecified: Secondary | ICD-10-CM | POA: Diagnosis not present

## 2024-07-04 DIAGNOSIS — W010XXA Fall on same level from slipping, tripping and stumbling without subsequent striking against object, initial encounter: Secondary | ICD-10-CM | POA: Diagnosis present

## 2024-07-04 DIAGNOSIS — R0902 Hypoxemia: Secondary | ICD-10-CM | POA: Diagnosis not present

## 2024-07-04 DIAGNOSIS — K219 Gastro-esophageal reflux disease without esophagitis: Secondary | ICD-10-CM | POA: Diagnosis present

## 2024-07-04 DIAGNOSIS — Z888 Allergy status to other drugs, medicaments and biological substances status: Secondary | ICD-10-CM

## 2024-07-04 DIAGNOSIS — Y92129 Unspecified place in nursing home as the place of occurrence of the external cause: Secondary | ICD-10-CM | POA: Diagnosis not present

## 2024-07-04 DIAGNOSIS — F32A Depression, unspecified: Secondary | ICD-10-CM | POA: Diagnosis present

## 2024-07-04 DIAGNOSIS — J4540 Moderate persistent asthma, uncomplicated: Secondary | ICD-10-CM

## 2024-07-04 DIAGNOSIS — I482 Chronic atrial fibrillation, unspecified: Secondary | ICD-10-CM | POA: Diagnosis present

## 2024-07-04 DIAGNOSIS — D72829 Elevated white blood cell count, unspecified: Secondary | ICD-10-CM | POA: Diagnosis present

## 2024-07-04 DIAGNOSIS — E785 Hyperlipidemia, unspecified: Secondary | ICD-10-CM | POA: Diagnosis present

## 2024-07-04 DIAGNOSIS — Z85828 Personal history of other malignant neoplasm of skin: Secondary | ICD-10-CM

## 2024-07-04 DIAGNOSIS — Z7901 Long term (current) use of anticoagulants: Secondary | ICD-10-CM

## 2024-07-04 DIAGNOSIS — R296 Repeated falls: Secondary | ICD-10-CM | POA: Diagnosis present

## 2024-07-04 DIAGNOSIS — E039 Hypothyroidism, unspecified: Secondary | ICD-10-CM | POA: Diagnosis present

## 2024-07-04 DIAGNOSIS — S72002A Fracture of unspecified part of neck of left femur, initial encounter for closed fracture: Secondary | ICD-10-CM | POA: Diagnosis present

## 2024-07-04 DIAGNOSIS — E1169 Type 2 diabetes mellitus with other specified complication: Secondary | ICD-10-CM | POA: Diagnosis present

## 2024-07-04 DIAGNOSIS — I5032 Chronic diastolic (congestive) heart failure: Secondary | ICD-10-CM | POA: Diagnosis present

## 2024-07-04 DIAGNOSIS — F418 Other specified anxiety disorders: Secondary | ICD-10-CM | POA: Diagnosis not present

## 2024-07-04 DIAGNOSIS — Z90710 Acquired absence of both cervix and uterus: Secondary | ICD-10-CM

## 2024-07-04 DIAGNOSIS — I2781 Cor pulmonale (chronic): Secondary | ICD-10-CM | POA: Diagnosis present

## 2024-07-04 DIAGNOSIS — I4811 Longstanding persistent atrial fibrillation: Secondary | ICD-10-CM | POA: Diagnosis present

## 2024-07-04 DIAGNOSIS — Z7902 Long term (current) use of antithrombotics/antiplatelets: Secondary | ICD-10-CM

## 2024-07-04 DIAGNOSIS — Z955 Presence of coronary angioplasty implant and graft: Secondary | ICD-10-CM

## 2024-07-04 DIAGNOSIS — D62 Acute posthemorrhagic anemia: Secondary | ICD-10-CM

## 2024-07-04 DIAGNOSIS — I252 Old myocardial infarction: Secondary | ICD-10-CM

## 2024-07-04 DIAGNOSIS — I480 Paroxysmal atrial fibrillation: Secondary | ICD-10-CM

## 2024-07-04 DIAGNOSIS — I4819 Other persistent atrial fibrillation: Secondary | ICD-10-CM | POA: Diagnosis not present

## 2024-07-04 DIAGNOSIS — Z79890 Hormone replacement therapy (postmenopausal): Secondary | ICD-10-CM

## 2024-07-04 DIAGNOSIS — I25118 Atherosclerotic heart disease of native coronary artery with other forms of angina pectoris: Secondary | ICD-10-CM | POA: Diagnosis not present

## 2024-07-04 DIAGNOSIS — S72142A Displaced intertrochanteric fracture of left femur, initial encounter for closed fracture: Secondary | ICD-10-CM | POA: Diagnosis not present

## 2024-07-04 DIAGNOSIS — Z9841 Cataract extraction status, right eye: Secondary | ICD-10-CM

## 2024-07-04 DIAGNOSIS — Z8249 Family history of ischemic heart disease and other diseases of the circulatory system: Secondary | ICD-10-CM

## 2024-07-04 DIAGNOSIS — Z9181 History of falling: Secondary | ICD-10-CM

## 2024-07-04 DIAGNOSIS — Z833 Family history of diabetes mellitus: Secondary | ICD-10-CM

## 2024-07-04 DIAGNOSIS — Z96652 Presence of left artificial knee joint: Secondary | ICD-10-CM | POA: Diagnosis present

## 2024-07-04 DIAGNOSIS — F0284 Dementia in other diseases classified elsewhere, unspecified severity, with anxiety: Secondary | ICD-10-CM | POA: Diagnosis present

## 2024-07-04 DIAGNOSIS — G301 Alzheimer's disease with late onset: Secondary | ICD-10-CM | POA: Diagnosis present

## 2024-07-04 DIAGNOSIS — Z9842 Cataract extraction status, left eye: Secondary | ICD-10-CM

## 2024-07-04 DIAGNOSIS — I5030 Unspecified diastolic (congestive) heart failure: Secondary | ICD-10-CM | POA: Diagnosis not present

## 2024-07-04 DIAGNOSIS — I959 Hypotension, unspecified: Secondary | ICD-10-CM

## 2024-07-04 DIAGNOSIS — F331 Major depressive disorder, recurrent, moderate: Secondary | ICD-10-CM | POA: Diagnosis present

## 2024-07-04 DIAGNOSIS — Z79899 Other long term (current) drug therapy: Secondary | ICD-10-CM

## 2024-07-04 LAB — CBC WITH DIFFERENTIAL/PLATELET
Abs Immature Granulocytes: 0.09 K/uL — ABNORMAL HIGH (ref 0.00–0.07)
Basophils Absolute: 0 K/uL (ref 0.0–0.1)
Basophils Relative: 0 %
Eosinophils Absolute: 0.5 K/uL (ref 0.0–0.5)
Eosinophils Relative: 4 %
HCT: 40 % (ref 36.0–46.0)
Hemoglobin: 12.5 g/dL (ref 12.0–15.0)
Immature Granulocytes: 1 %
Lymphocytes Relative: 15 %
Lymphs Abs: 2 K/uL (ref 0.7–4.0)
MCH: 24.3 pg — ABNORMAL LOW (ref 26.0–34.0)
MCHC: 31.3 g/dL (ref 30.0–36.0)
MCV: 77.7 fL — ABNORMAL LOW (ref 80.0–100.0)
Monocytes Absolute: 0.9 K/uL (ref 0.1–1.0)
Monocytes Relative: 7 %
Neutro Abs: 9.5 K/uL — ABNORMAL HIGH (ref 1.7–7.7)
Neutrophils Relative %: 73 %
Platelets: 438 K/uL — ABNORMAL HIGH (ref 150–400)
RBC: 5.15 MIL/uL — ABNORMAL HIGH (ref 3.87–5.11)
RDW: 15.2 % (ref 11.5–15.5)
WBC: 13 K/uL — ABNORMAL HIGH (ref 4.0–10.5)
nRBC: 0 % (ref 0.0–0.2)

## 2024-07-04 LAB — CBG MONITORING, ED: Glucose-Capillary: 215 mg/dL — ABNORMAL HIGH (ref 70–99)

## 2024-07-04 LAB — COMPREHENSIVE METABOLIC PANEL WITH GFR
ALT: 26 U/L (ref 0–44)
AST: 32 U/L (ref 15–41)
Albumin: 4.1 g/dL (ref 3.5–5.0)
Alkaline Phosphatase: 78 U/L (ref 38–126)
Anion gap: 14 (ref 5–15)
BUN: 18 mg/dL (ref 8–23)
CO2: 19 mmol/L — ABNORMAL LOW (ref 22–32)
Calcium: 8.9 mg/dL (ref 8.9–10.3)
Chloride: 103 mmol/L (ref 98–111)
Creatinine, Ser: 0.84 mg/dL (ref 0.44–1.00)
GFR, Estimated: >60 mL/min (ref >60)
Glucose, Bld: 229 mg/dL — ABNORMAL HIGH (ref 70–99)
Potassium: 3.8 mmol/L (ref 3.5–5.1)
Sodium: 136 mmol/L (ref 135–145)
Total Bilirubin: 0.6 mg/dL (ref 0.0–1.2)
Total Protein: 7.4 g/dL (ref 6.5–8.1)

## 2024-07-04 LAB — BRAIN NATRIURETIC PEPTIDE: B Natriuretic Peptide: 175 pg/mL — ABNORMAL HIGH (ref 0.0–100.0)

## 2024-07-04 MED ORDER — INSULIN ASPART 100 UNIT/ML IJ SOLN
0.0000 [IU] | Freq: Three times a day (TID) | INTRAMUSCULAR | Status: DC
  Administered 2024-07-05: 3 [IU] via SUBCUTANEOUS
  Administered 2024-07-05 – 2024-07-06 (×4): 2 [IU] via SUBCUTANEOUS
  Administered 2024-07-06 – 2024-07-07 (×4): 3 [IU] via SUBCUTANEOUS
  Administered 2024-07-08: 2 [IU] via SUBCUTANEOUS
  Administered 2024-07-08: 5 [IU] via SUBCUTANEOUS
  Administered 2024-07-09 (×2): 2 [IU] via SUBCUTANEOUS
  Administered 2024-07-09: 3 [IU] via SUBCUTANEOUS
  Administered 2024-07-10: 2 [IU] via SUBCUTANEOUS
  Administered 2024-07-10: 3 [IU] via SUBCUTANEOUS
  Administered 2024-07-10: 1 [IU] via SUBCUTANEOUS
  Administered 2024-07-11 – 2024-07-13 (×7): 2 [IU] via SUBCUTANEOUS
  Filled 2024-07-04 (×24): qty 1

## 2024-07-04 MED ORDER — INSULIN ASPART 100 UNIT/ML IJ SOLN
0.0000 [IU] | Freq: Every day | INTRAMUSCULAR | Status: DC
  Administered 2024-07-04 – 2024-07-08 (×3): 2 [IU] via SUBCUTANEOUS
  Filled 2024-07-04 (×3): qty 1

## 2024-07-04 MED ORDER — METOPROLOL TARTRATE 5 MG/5ML IV SOLN
2.5000 mg | INTRAVENOUS | Status: DC | PRN
  Administered 2024-07-04 – 2024-07-06 (×3): 2.5 mg via INTRAVENOUS
  Administered 2024-07-06: 3 mg via INTRAVENOUS
  Administered 2024-07-06 (×2): 2.5 mg via INTRAVENOUS
  Administered 2024-07-06: 2 mg via INTRAVENOUS
  Administered 2024-07-10: 2.5 mg via INTRAVENOUS
  Filled 2024-07-04 (×6): qty 5

## 2024-07-04 MED ORDER — ASPIRIN 325 MG PO TBEC: 325.0000 mg | DELAYED_RELEASE_TABLET | Freq: Every day | ORAL | Status: DC

## 2024-07-04 MED ORDER — ACETAMINOPHEN 325 MG PO TABS
650.0000 mg | ORAL_TABLET | Freq: Four times a day (QID) | ORAL | Status: DC | PRN
  Administered 2024-07-06 – 2024-07-07 (×2): 650 mg via ORAL
  Filled 2024-07-04 (×2): qty 2

## 2024-07-04 MED ORDER — TIOTROPIUM BROMIDE MONOHYDRATE 1.25 MCG/ACT IN AERS: 1.0000 | INHALATION_SPRAY | Freq: Every day | RESPIRATORY_TRACT | Status: DC

## 2024-07-04 MED ORDER — MORPHINE SULFATE (PF) 4 MG/ML IV SOLN
4.0000 mg | Freq: Once | INTRAVENOUS | Status: AC
  Administered 2024-07-04: 4 mg via INTRAVENOUS
  Filled 2024-07-04: qty 1

## 2024-07-04 MED ORDER — DM-GUAIFENESIN ER 30-600 MG PO TB12: 1.0000 | ORAL_TABLET | Freq: Two times a day (BID) | ORAL | Status: DC | PRN

## 2024-07-04 MED ORDER — LOSARTAN POTASSIUM 25 MG PO TABS
12.5000 mg | ORAL_TABLET | Freq: Every day | ORAL | Status: DC
Start: 2024-07-05 — End: 2024-07-05
  Filled 2024-07-04: qty 0.5

## 2024-07-04 MED ORDER — LIDOCAINE 5 % EX PTCH
1.0000 | MEDICATED_PATCH | CUTANEOUS | Status: DC
  Administered 2024-07-04 – 2024-07-13 (×10): 1 via TRANSDERMAL
  Filled 2024-07-04 (×11): qty 1

## 2024-07-04 MED ORDER — MORPHINE SULFATE (PF) 2 MG/ML IV SOLN
1.0000 mg | INTRAVENOUS | Status: DC | PRN
  Administered 2024-07-04: 1 mg via INTRAVENOUS
  Filled 2024-07-04: qty 1

## 2024-07-04 MED ORDER — NITROGLYCERIN 0.4 MG SL SUBL: 0.4000 mg | SUBLINGUAL_TABLET | SUBLINGUAL | Status: DC | PRN

## 2024-07-04 MED ORDER — DONEPEZIL HCL 5 MG PO TABS
10.0000 mg | ORAL_TABLET | Freq: Every day | ORAL | Status: DC
  Administered 2024-07-04 – 2024-07-13 (×10): 10 mg via ORAL
  Filled 2024-07-04 (×10): qty 2

## 2024-07-04 MED ORDER — DILTIAZEM HCL-DEXTROSE 125-5 MG/125ML-% IV SOLN (PREMIX)
5.0000 mg/h | INTRAVENOUS | Status: DC
  Administered 2024-07-04: 5 mg/h via INTRAVENOUS
  Filled 2024-07-04 (×3): qty 125

## 2024-07-04 MED ORDER — FAMOTIDINE 20 MG PO TABS
20.0000 mg | ORAL_TABLET | Freq: Two times a day (BID) | ORAL | Status: DC
  Administered 2024-07-05 – 2024-07-14 (×19): 20 mg via ORAL
  Filled 2024-07-04 (×19): qty 1

## 2024-07-04 MED ORDER — SENNOSIDES-DOCUSATE SODIUM 8.6-50 MG PO TABS
1.0000 | ORAL_TABLET | Freq: Every evening | ORAL | Status: DC | PRN
  Administered 2024-07-05: 1 via ORAL
  Filled 2024-07-04: qty 1

## 2024-07-04 MED ORDER — ONDANSETRON HCL 4 MG/2ML IJ SOLN
4.0000 mg | Freq: Once | INTRAMUSCULAR | Status: AC
  Administered 2024-07-04: 4 mg via INTRAVENOUS
  Filled 2024-07-04: qty 2

## 2024-07-04 MED ORDER — GABAPENTIN 100 MG PO CAPS
100.0000 mg | ORAL_CAPSULE | Freq: Every day | ORAL | Status: DC
  Administered 2024-07-04 – 2024-07-13 (×10): 100 mg via ORAL
  Filled 2024-07-04 (×10): qty 1

## 2024-07-04 MED ORDER — LEVOTHYROXINE SODIUM 88 MCG PO TABS
88.0000 ug | ORAL_TABLET | Freq: Every day | ORAL | Status: DC
  Administered 2024-07-05 – 2024-07-14 (×10): 88 ug via ORAL
  Filled 2024-07-04 (×11): qty 1

## 2024-07-04 MED ORDER — HYDRALAZINE HCL 20 MG/ML IJ SOLN: 5.0000 mg | INTRAMUSCULAR | Status: DC | PRN

## 2024-07-04 MED ORDER — MORPHINE SULFATE (PF) 2 MG/ML IV SOLN
1.0000 mg | INTRAVENOUS | Status: DC | PRN
  Administered 2024-07-05 – 2024-07-06 (×6): 1 mg via INTRAVENOUS
  Filled 2024-07-04 (×6): qty 1

## 2024-07-04 MED ORDER — PRAVASTATIN SODIUM 20 MG PO TABS
20.0000 mg | ORAL_TABLET | Freq: Every day | ORAL | Status: DC
  Administered 2024-07-04 – 2024-07-13 (×10): 20 mg via ORAL
  Filled 2024-07-04 (×10): qty 1

## 2024-07-04 MED ORDER — OXYCODONE-ACETAMINOPHEN 5-325 MG PO TABS
1.0000 | ORAL_TABLET | ORAL | Status: DC | PRN
  Administered 2024-07-04 – 2024-07-06 (×5): 1 via ORAL
  Filled 2024-07-04 (×5): qty 1

## 2024-07-04 MED ORDER — ALBUTEROL SULFATE (2.5 MG/3ML) 0.083% IN NEBU: 3.0000 mL | INHALATION_SOLUTION | RESPIRATORY_TRACT | Status: DC | PRN

## 2024-07-04 MED ORDER — TORSEMIDE 20 MG PO TABS: 20.0000 mg | ORAL_TABLET | Freq: Every day | ORAL | Status: DC | PRN

## 2024-07-04 MED ORDER — METHOCARBAMOL 500 MG PO TABS
500.0000 mg | ORAL_TABLET | Freq: Three times a day (TID) | ORAL | Status: DC | PRN
  Administered 2024-07-05 – 2024-07-10 (×9): 500 mg via ORAL
  Filled 2024-07-04 (×10): qty 1

## 2024-07-04 MED ORDER — VENLAFAXINE HCL ER 75 MG PO CP24
75.0000 mg | ORAL_CAPSULE | Freq: Every day | ORAL | Status: DC
  Administered 2024-07-05 – 2024-07-14 (×10): 75 mg via ORAL
  Filled 2024-07-04 (×10): qty 1

## 2024-07-04 MED ORDER — HYDROMORPHONE HCL 1 MG/ML IJ SOLN
1.0000 mg | Freq: Once | INTRAMUSCULAR | Status: AC
  Administered 2024-07-04: 1 mg via INTRAVENOUS
  Filled 2024-07-04: qty 1

## 2024-07-04 MED ORDER — ONDANSETRON HCL 4 MG/2ML IJ SOLN
4.0000 mg | Freq: Three times a day (TID) | INTRAMUSCULAR | Status: DC | PRN
  Administered 2024-07-06 – 2024-07-08 (×2): 4 mg via INTRAVENOUS
  Filled 2024-07-04: qty 2

## 2024-07-04 NOTE — ED Notes (Signed)
 Hospitalist at bedside

## 2024-07-04 NOTE — H&P (Signed)
 History and Physical    Alison Richardson FMW:[id number] DOB: 09/27/1933 DOA: 07/04/2024  Referring MD/NP/PA:   PCP: Herold Hadassah SQUIBB, MD   Patient coming from:  The patient is coming from SNF   Chief Complaint: fall and left hip pain  HPI: Alison Richardson is a 89 y.o. female with medical history significant of CAD, recent NSTEMI with stent placement, dCHF, chronic cor pulmonale, A fib on Eliquis , HTN, HDL, DM, asthma, PVD, hypothyroidism, dementia, depression with anxiety, GERD, who presents with fall and left hip pain.  Patient was recently hospitalized from 5/22 - 5/28 due to non-STEMI with 2 stent placement.  Per discharge summary, pt is recommended to take apixaban  and Plavix  indefinitely, aspirin  for next 7 days.  Patient states that she is only taking Eliquis  currently.  She took her last dose of Eliquis  this morning.  Patient states that she accidentally fell when she was walking from kitchen to wheelchair using walker. No LOC. She injured her left hip, causing pain in left hip, which is constant, sharp, severe, nonradiating, aggravated by movement.  Left leg is shortened and externally rotated.  Patient does not have chest pain, cough, SOB.  She states she has GI bug with intermittent nausea and vomiting in the past 3 days which has improved today, no more active vomiting currently, but still has some dry heaves. No abdominal pain or diarrhea.  No symptoms of UTI.  No fever or chills.    Patient is found to have A-fib with RVR heart rate 119-140s in ED   Data reviewed independently and ED Course: pt was found to have GFR> 60, WBC 13.0, temperature 99.3, blood pressure 182/88 --> 153/92, RR 13, oxygen saturation 100% on room air.  Chest x-ray negative.  Patient is admitted to PCU as an inpatient.  Dr. Maryrose of Ortho is consulted.  X-ray of left hip/pelvis showed:  Acute comminuted intertrochanteric fracture of the left femur.  CT head: 1. No acute intracranial abnormality. 2. Similar  11 mm periphery calcified right MCA aneurysm. 3. Similar 10 mm extra-axial mass over the left frontal convexity at the vertex without mass effect or edema likely representing a meningioma.    EKG: I have personally reviewed.  A-fib with RVR, heart rate of 120, LAD, poor R wave progression   Review of Systems:   General: no fevers, chills, no body weight gain, has fatigue HEENT: no blurry vision, hearing changes or sore throat Respiratory: no dyspnea, coughing, wheezing CV: no chest pain, no palpitations GI: has nausea, dry heaves, no vomiting, abdominal pain, diarrhea, constipation GU: no dysuria, burning on urination, increased urinary frequency, hematuria  Ext: no leg edema Neuro: no unilateral weakness, numbness, or tingling, no vision change or hearing loss.  Has fall Skin: no rash, no skin tear. MSK: No muscle spasm, no deformity, no limitation of range of movement in spin.  Has left hip pain Heme: No easy bruising.  Travel history: No recent long distant travel.   Allergy:  Allergies  Allergen Reactions   Pantoprazole  Nausea And Vomiting   Metformin And Related Diarrhea    Past Medical History:  Diagnosis Date   Acute on chronic congestive heart failure (HCC) 10/07/2020   Arrhythmia    Atrial fibrillation and flutter (HCC) 01/06/2019   Plavix  added to eliquis  10-22 after nstemi       Last Assessment & Plan:    Marked facial hematoma post fall but ct of neck and head reportedly normal. No ha or ataxia now but  is using a walker. On eliquis  and plavix      Atrial flutter (HCC) 01/2018   new onset    Basal cell carcinoma of back    Basal cell carcinoma of lip    Cerebral aneurysm    followed by [facility]   CHF (congestive heart failure) (HCC)    DDD (degenerative disc disease), lumbar    superior plate depression, L3 08/18/2014   Diabetes mellitus type II, controlled (HCC)    Diverticulosis    Dysrhythmia    Paroxysmal Supraventricular Tachycardia   Dysthymia     depression   GERD (gastroesophageal reflux disease)    History of falling 04/03/2022   History of meniscal tear    Humeral surgical neck fracture 05/16/2022   Hyperlipidemia    Hypertension    Hypokalemia 12/12/2022   Hypothyroidism    Late onset Alzheimer's disease with behavioral disturbance (HCC)    Leukocytosis 10/07/2020   Mild pulmonary hypertension (HCC)    Moderate asthma without complication 12/12/2022   Multifocal pneumonia 12/09/2022   Overactive bladder    Peripheral vascular disease (HCC)    Puncture wound of forehead 06/11/2023   Seasonal allergic rhinitis    Severe sepsis (HCC) 02/07/2022   Sleep apnea     Past Surgical History:  Procedure Laterality Date   APPENDECTOMY  1946   BASAL CELL CARCINOMA EXCISION     2006 and 2009 removed from back and lip   CARDIOVASCULAR STRESS TEST  2015   nuclear cardiac stress test negative for ischemia - Dr. Roosevelt   CATARACT EXTRACTION, BILATERAL  2006   COLONOSCOPY  2014   COLONOSCOPY N/A 08/19/2020   Procedure: COLONOSCOPY;  Surgeon: Maryruth Ole DASEN, MD;  Location: [facility] ENDOSCOPY;  Service: Endoscopy;  Laterality: N/A;   CORONARY STENT INTERVENTION N/A 05/19/2024   Procedure: CORONARY STENT INTERVENTION;  Surgeon: Mady Bruckner, MD;  Location: [facility] INVASIVE CV LAB;  Service: Cardiovascular;  Laterality: N/A;   ECTOPIC PREGNANCY SURGERY  1957   ESOPHAGOGASTRODUODENOSCOPY N/A 08/19/2020   Procedure: ESOPHAGOGASTRODUODENOSCOPY (EGD);  Surgeon: Maryruth Ole DASEN, MD;  Location: [facility] ENDOSCOPY;  Service: Endoscopy;  Laterality: N/A;   INJECTION KNEE Left 05/2015   LEFT HEART CATH AND CORONARY ANGIOGRAPHY N/A 10/12/2021   Procedure: LEFT HEART CATH AND CORONARY ANGIOGRAPHY;  Surgeon: Hester Wolm PARAS, MD;  Location: [facility] INVASIVE CV LAB;  Service: Cardiovascular;  Laterality: N/A;   REPLACEMENT TOTAL KNEE Left 09/06/2016   Dr. Chesley   RIGHT/LEFT HEART CATH AND CORONARY ANGIOGRAPHY N/A 05/19/2024   Procedure:  RIGHT/LEFT HEART CATH AND CORONARY ANGIOGRAPHY;  Surgeon: Mady Bruckner, MD;  Location: [facility] INVASIVE CV LAB;  Service: Cardiovascular;  Laterality: N/A;   TONSILLECTOMY AND ADENOIDECTOMY  1953   TOTAL ABDOMINAL HYSTERECTOMY  1986   [facility]    Social History:  reports that she has never smoked. She has never used smokeless tobacco. She reports that she does not currently use alcohol. She reports that she does not use drugs.  Family History:  Family History  Problem Relation Age of Onset   Hypertension Mother    Heart disease Father    CAD Father    Heart disease Sister    Diabetes Sister    Diabetes Paternal Uncle    Sleep apnea Son      Prior to Admission medications   Medication Sig Start Date End Date Taking? Authorizing Provider  albuterol  (PROVENTIL ) (2.5 MG/3ML) 0.083% nebulizer solution Take 3 mLs (2.5 mg total) by nebulization every 6 (six) hours as  needed for wheezing or shortness of breath. 04/03/24   Melvin Pao, NP  apixaban  (ELIQUIS ) 5 MG TABS tablet Take 1 tablet (5 mg total) by mouth 2 (two) times daily. 12/09/23   Gollan, Timothy J, MD  clopidogrel  (PLAVIX ) 75 MG tablet Take 1 tablet (75 mg total) by mouth daily with breakfast. 05/21/24   Dezii, Lorane, DO  donepezil  (ARICEPT ) 10 MG tablet TAKE ONE TABLET (10 MG) BY MOUTH AT BEDTIME 04/01/24   Herold Hadassah SQUIBB, MD  famotidine  (PEPCID ) 20 MG tablet Take 1 tablet (20 mg total) by mouth 2 (two) times daily before a meal. 05/20/24 06/19/24  Dezii, Alexandra, DO  gabapentin  (NEURONTIN ) 100 MG capsule Take 1 capsule (100 mg total) by mouth at bedtime. 09/10/23   Herold Hadassah SQUIBB, MD  levothyroxine  (SYNTHROID ) 88 MCG tablet TAKE 1 TABLET EVERY DAY ON EMPTY STOMACHWITH A GLASS OF WATER AT LEAST 30-60 MINBEFORE BREAKFAST 10/31/23   Herold Hadassah SQUIBB, MD  losartan  (COZAAR ) 25 MG tablet Take 0.5 tablets (12.5 mg total) by mouth daily. 06/24/24   Gerard Frederick, NP  nitroGLYCERIN  (NITROSTAT ) 0.4 MG SL tablet Place 1 tablet (0.4 mg total)  under the tongue every 5 (five) minutes as needed for chest pain. 12/09/23   Gollan, Timothy J, MD  pravastatin  (PRAVACHOL ) 20 MG tablet Take 1 tablet (20 mg total) by mouth at bedtime. 12/09/23   Gollan, Timothy J, MD  Semaglutide , 1 MG/DOSE, (OZEMPIC , 1 MG/DOSE,) 2 MG/1.5ML SOPN Inject 1 mg into the skin once a week. 06/24/24   Herold Hadassah SQUIBB, MD  Semaglutide , 2 MG/DOSE, 8 MG/3ML SOPN Inject 2 mg as directed once a week. 06/24/24   Herold Hadassah SQUIBB, MD  spironolactone  (ALDACTONE ) 25 MG tablet Take 0.5 tablets (12.5 mg total) by mouth daily. 06/01/24 06/01/25  Gerard Frederick, NP  Tiotropium Bromide  Monohydrate (SPIRIVA  RESPIMAT) 1.25 MCG/ACT AERS Inhale into the lungs.    [provider]  torsemide  (DEMADEX ) 20 MG tablet Take 1 tablet (20 mg total) by mouth daily as needed. For leg edema or sob 05/09/24   Tobie Calix, MD  venlafaxine  XR (EFFEXOR -XR) 75 MG 24 hr capsule TAKE 1 CAPSULE BY MOUTH ONCE DAILY. 06/02/24   Herold Hadassah SQUIBB, MD    Physical Exam: Vitals:   07/04/24 2045 07/04/24 2135 07/04/24 2200 07/04/24 2230  BP: 110/77 132/79 (!) 146/90 114/78  Pulse: (!) 109 (!) 123  (!) 117  Resp: 16 18 12 16   Temp:      SpO2: (!) 89% 100%  90%  Weight:      Height:       General: Not in acute distress HEENT:       Eyes: PERRL, EOMI, no jaundice       ENT: No discharge from the ears and nose, no pharynx injection, no tonsillar enlargement.        Neck: No JVD, no bruit, no mass felt. Heme: No neck lymph node enlargement. Cardiac: S1/S2, irregularly irregular rhythm, no gallops or rubs. Respiratory: No rales, wheezing, rhonchi or rubs. GI: Soft, nondistended, nontender, no rebound pain, no organomegaly, BS present. GU: No hematuria Ext: No pitting leg edema bilaterally. 1+DP/PT pulse bilaterally. Musculoskeletal: Has tenderness to the left hip, left leg is shortened and externally rotated Skin: No rashes.  Neuro: Alert, oriented X3, cranial nerves II-XII grossly intact, moves all  extremities normally.  Psych: Patient is not psychotic, no suicidal or hemocidal ideation.  Labs on Admission: I have personally reviewed following labs and imaging studies  CBC:  Recent Labs  Lab 07/04/24 1827  WBC 13.0*  NEUTROABS 9.5*  HGB 12.5  HCT 40.0  MCV 77.7*  PLT 438*   Basic Metabolic Panel: Recent Labs  Lab 07/04/24 1827  NA 136  K 3.8  CL 103  CO2 19*  GLUCOSE 229*  BUN 18  CREATININE 0.84  CALCIUM  8.9   GFR: Estimated Creatinine Clearance: 46.3 mL/min (by C-G formula based on SCr of 0.84 mg/dL). Liver Function Tests: Recent Labs  Lab 07/04/24 1827  AST 32  ALT 26  ALKPHOS 78  BILITOT 0.6  PROT 7.4  ALBUMIN 4.1   No results for input(s): LIPASE, AMYLASE in the last 168 hours. No results for input(s): AMMONIA in the last 168 hours. Coagulation Profile: No results for input(s): INR, PROTIME in the last 168 hours. Cardiac Enzymes: No results for input(s): CKTOTAL, CKMB, CKMBINDEX, TROPONINI in the last 168 hours. BNP (last 3 results) No results for input(s): PROBNP in the last 8760 hours. HbA1C: No results for input(s): HGBA1C in the last 72 hours. CBG: Recent Labs  Lab 07/04/24 2125  GLUCAP 215*   Lipid Profile: No results for input(s): CHOL, HDL, LDLCALC, TRIG, CHOLHDL, LDLDIRECT in the last 72 hours. Thyroid  Function Tests: No results for input(s): TSH, T4TOTAL, FREET4, T3FREE, THYROIDAB in the last 72 hours. Anemia Panel: No results for input(s): VITAMINB12, FOLATE, FERRITIN, TIBC, IRON , RETICCTPCT in the last 72 hours. Urine analysis:    Component Value Date/Time   COLORURINE STRAW (A) 05/08/2024 2104   APPEARANCEUR CLEAR (A) 05/08/2024 2104   APPEARANCEUR Clear 09/03/2023 1126   LABSPEC 1.008 05/08/2024 2104   PHURINE 7.0 05/08/2024 2104   GLUCOSEU NEGATIVE 05/08/2024 2104   HGBUR NEGATIVE 05/08/2024 2104   BILIRUBINUR NEGATIVE 05/08/2024 2104   BILIRUBINUR Negative  09/03/2023 1126   KETONESUR NEGATIVE 05/08/2024 2104   PROTEINUR NEGATIVE 05/08/2024 2104   NITRITE NEGATIVE 05/08/2024 2104   LEUKOCYTESUR NEGATIVE 05/08/2024 2104   Sepsis Labs: @LABRCNTIP (procalcitonin:4,lacticidven:4) )No results found for this or any previous visit (from the past 240 hours).   Radiological Exams on Admission:   Assessment/Plan Principal Problem:   Closed left hip fracture [facility]) Active Problems:   Chronic atrial fibrillation with RVR (HCC)   Fall at home, initial encounter   CAD (coronary artery disease)   Chronic diastolic CHF (congestive heart failure) (HCC)   Cor pulmonale, chronic (HCC)   Type 2 diabetes mellitus with peripheral neuropathy (HCC)   Hyperlipidemia associated with type 2 diabetes mellitus (HCC)   Hypothyroidism   Asthma   Nausea & vomiting   Late onset Alzheimer's disease without behavioral disturbance (HCC)   Depression with anxiety   Leukocytosis   Assessment and Plan:  Closed left hip fracture (HCC):  X-ray showed acute comminuted intertrochanteric fracture of the left femur. Patient has moderate pain now. No neurovascular compromise. Orthopedic surgeon, Dr. Kyle was consulted, planning to do surgery on Monday afternoon since pt needs to wean off Eliquis . Given patient's very significant cardiac history, I sent Epic message to CV DIV, [facility] pool for cardiology consult for presurgical clearance.  - will admit to PCU bed as inpt - Pain control: prn morphine , percocet and tyleno - When necessary Zofran  for nausea - Robaxin  for muscle spasm - Lidoderm  patch for pain - type and cross - INR/PTT - PT/OT when able to (not ordered now)  Chronic atrial fibrillation with RVR (HCC): HR is 119-140s in ED. Pt is not taking nodal blockers.  She used to be on amiodarone  which  was discontinued due to SOB. - Hold Eliquis  and Plavix  for surgery - Metoprolol  as needed 2.5 mg every 2 hours for heart rate> 125 - Cardizem  drip started  Fall  at home, initial encounter -PT/OT  CAD (coronary artery disease): S/p of stent placement recently.  No chest pain today -Hold Plavix  and Elqiuis for surgery  --> will start aspirin  325 mg daily due to recent stent placement - Continue pravastatin  - As needed nitroglycerin   Chronic diastolic CHF and Cor pulmonale, chronic : 2D echo on 06/18/2024 showed EF 60 to 65%.  Patient does not have leg edema or JVD.  No SOB.  CHF is compensated. -Continue home torsemide  every other day - Check BNP  Type 2 diabetes mellitus with peripheral neuropathy [facility]): Recent A1c 8.2, poorly controlled.  Patient is taking Ozempic  - SSI  Hyperlipidemia associated with type 2 diabetes mellitus (HCC) -Pravastatin   Hypothyroidism - Synthroid   Nausea, vomiting: Patient reports nausea, vomiting in the past 3 days, which has been improving, currently has dry heaves.  No abdominal pain or diarrhea.  No fever or chills.  Possibly due to viral gastritis. - Supportive care - As needed Zofran   Asthma: Stable -Bronchodilators as needed Mucinex   Late onset Alzheimer's disease without behavioral disturbance (HCC): -Donepezil   Depression with anxiety - Venlafaxine   Leukocytosis: WBC 13.0, no fever, no signs of infection.  Likely reactive. - Follow-up with CBC  Abnormal findings on CT of head:  CT-head showed  a 11 mm periphery calcified right MCA aneurysm, and 10 mm extra-axial mass over the left frontal convexity at the vertex without mass effect or edema likely representing a meningioma. -F/u with PCP    DVT ppx: SCD  Code Status: DNR per pt and her daughter  Family Communication:  Yes, patient's daughter at bed side.    Disposition Plan:  Anticipate discharge back to previous environment, SNF  Consults called:  Dr. Maryrose of Ortho is consulted.  Admission status and Level of care: Progressive: as inpt        Dispo: The patient is from: SNF              Anticipated d/c is to: SNF               Anticipated d/c date is: 2 days              Patient currently is not medically stable to d/c.    Severity of Illness:  The appropriate patient status for this patient is INPATIENT. Inpatient status is judged to be reasonable and necessary in order to provide the required intensity of service to ensure the patient's safety. The patient's presenting symptoms, physical exam findings, and initial radiographic and laboratory data in the context of their chronic comorbidities is felt to place them at high risk for further clinical deterioration. Furthermore, it is not anticipated that the patient will be medically stable for discharge from the hospital within 2 midnights of admission.   * I certify that at the point of admission it is my clinical judgment that the patient will require inpatient hospital care spanning beyond 2 midnights from the point of admission due to high intensity of service, high risk for further deterioration and high frequency of surveillance required.*       Date of Service 07/04/2024    Caleb Exon - [facility]   If 7PM-7AM, please contact night-coverage [email or web address] 07/04/2024, 10:46 PM

## 2024-07-04 NOTE — ED Notes (Signed)
 Pt taken to XR.

## 2024-07-04 NOTE — ED Notes (Signed)
 Pharmacy called due to cardizem  drip showing blocked in pyxis. Pharmacy states they are printing new label for medication & tubing it to ED at this time.

## 2024-07-04 NOTE — ED Notes (Signed)
 Patient transported to CT

## 2024-07-04 NOTE — ED Notes (Addendum)
 Notified Dr Caleb of pt's blood pressure to get direction on titration of cardizem . He states as long as SBP >95 to continue titration to meet HR goal. Dr Xilin also changed pain management orders to better meet pt needs. Pt still rating pain 9/10 & is requesting more pain medication at this time.

## 2024-07-04 NOTE — ED Provider Notes (Signed)
 [facility] Provider Note   Event Date/Time   First MD Initiated Contact with Patient 07/04/24 1816     (approximate) History  No chief complaint on file.  HPI Alison Richardson is a 89 y.o. female with a past medical history of hypertension, type 2 diabetes, atrial flutter on Eliquis , CHF, pulmonary hypertension, and history of multiple falls who presents via EMS complaining of left hip pain after a mechanical fall just prior to arrival.  Patient has shortening, external rotation, and significant tenderness to palpation of lateral left hip.  Per EMS, patient has intact pulses to this left foot ROS: Patient currently denies any vision changes, tinnitus, difficulty speaking, facial droop, sore throat, chest pain, shortness of breath, abdominal pain, nausea/vomiting/diarrhea, dysuria, or weakness/numbness/paresthesias in any extremity   Physical Exam  Triage Vital Signs: ED Triage Vitals  Encounter Vitals Group     BP      Girls Systolic BP Percentile      Girls Diastolic BP Percentile      Boys Systolic BP Percentile      Boys Diastolic BP Percentile      Pulse      Resp      Temp      Temp src      SpO2      Weight      Height      Head Circumference      Peak Flow      Pain Score      Pain Loc      Pain Education      Exclude from Growth Chart    Most recent vital signs: There were no vitals filed for this visit. General: Awake, oriented x4. CV:  Good peripheral perfusion. Resp:  Normal effort. Abd:  No distention. Other:  Elderly overweight Caucasian female resting in stretcher in moderate distress secondary to pain.  Significant tenderness to palpation over the lateral left hip joint.  Significant tenderness to palpation at the left hip with any active or passive range of motion.  Patient is distally neurovascularly intact in the left lower extremity. ED Results / Procedures / Treatments  Labs (all labs ordered are listed, but only abnormal  results are displayed) Labs Reviewed - No data to display EKG ED ECG REPORT I, Artist MARLA Kerns, the attending physician, personally viewed and interpreted this ECG. Date: 07/04/2024 EKG Time: 1821 Rate: 120 Rhythm: Atrial flutter with 2-1 AV block QRS Axis: normal Intervals: normal ST/T Wave abnormalities: normal Narrative Interpretation: Atrial flutter with 2-1 AV block.  No evidence of acute ischemia RADIOLOGY ED MD interpretation:  *** - All radiology independently interpreted and agree with radiology assessment Official radiology report(s): No results found. PROCEDURES: Critical Care performed: {CriticalCareYesNo:19197::Yes, see critical care procedure note(s),No} Procedures MEDICATIONS ORDERED IN ED: Medications - No data to display IMPRESSION / MDM / ASSESSMENT AND PLAN / ED COURSE  I reviewed the triage vital signs and the nursing notes.                             The patient is on the cardiac monitor to evaluate for evidence of arrhythmia and/or significant heart rate changes. Patient's presentation is most consistent with {EM COPA:27473} {Remember to include, when applicable, any/all of the following data: independent review of imaging independent review of labs (comment specifically on pertinent positives and negatives) review of specific prior hospitalizations, PCP/specialist notes, etc. discuss meds given and  prescribed document any discussion with consultants (including hospitalists) any clinical decision tools you used and why (PECARN, NEXUS, etc.) did you consider admitting the patient? document social determinants of health affecting patient's care (homelessness, inability to follow up in a timely fashion, etc) document any pre-existing conditions increasing risk on current visit (e.g. diabetes and HTN increasing danger of high-risk chest pain/ACS) describes what meds you gave (especially parenteral) and why any other interventions?:1}   FINAL CLINICAL  IMPRESSION(S) / ED DIAGNOSES   Final diagnoses:  None   Rx / DC Orders   ED Discharge Orders     None      Note:  This document was prepared using Dragon voice recognition software and may include unintentional dictation errors.

## 2024-07-04 NOTE — ED Notes (Signed)
 Lab at bedside to obtain cbc & cmp.

## 2024-07-04 NOTE — ED Notes (Addendum)
 Pt. returned from XR.

## 2024-07-04 NOTE — ED Triage Notes (Signed)
 Pt BIB [facility] from [facility] for a fall today. Pt states she was returning to chair after making a sandwich and believes she tripped as she was trying to sit down. EMS reports L leg rotation, pain 10/10 but refused meds en route. Pt denies LOC. Pt does take Eliquis . Pt hx of OCPD, CHF, Diabetes. EMS reports CBG 214. EMS also stating pt has had N/V for a couple days.

## 2024-07-04 NOTE — ED Notes (Signed)
 Ortho at bedside, informed consent signed at bedside witnessed by this nurse. Paper copy placed in file for medical records.

## 2024-07-04 NOTE — Consult Note (Signed)
 ORTHOPEDIC CONSULTATION  Alison Richardson [id number]  07/04/2024  CC:  Chief Complaint  Patient presents with   Fall    History of Present IlIness: The patient is a 89 y.o. female who had a ground-level fall today in the nursing home that she resides in.  She had immediate onset of left hip pain and was unable to ambulate.  She was transported to the local emergency department where she was evaluated and found to have a left intertrochanteric hip fracture.  Orthopedics was consulted.  The patient's daughter is present to assist with history.  Further questioning and review of the patient's medical records reveals that she has a history of atrial fibrillation and takes Eliquis .  Her last dose was this morning 07/04/2024.  She has also been having issues with nausea and vomiting for several days.  PMH:  Past Medical History:  Diagnosis Date   Acute on chronic congestive heart failure (HCC) 10/07/2020   Arrhythmia    Atrial fibrillation and flutter (HCC) 01/06/2019   Plavix  added to eliquis  10-22 after nstemi       Last Assessment & Plan:    Marked facial hematoma post fall but ct of neck and head reportedly normal. No ha or ataxia now but is using a walker. On eliquis  and plavix      Atrial flutter (HCC) 01/2018   new onset    Basal cell carcinoma of back    Basal cell carcinoma of lip    Cerebral aneurysm    followed by [facility]   CHF (congestive heart failure) (HCC)    DDD (degenerative disc disease), lumbar    superior plate depression, L3 08/18/2014   Diabetes mellitus type II, controlled (HCC)    Diverticulosis    Dysrhythmia    Paroxysmal Supraventricular Tachycardia   Dysthymia    depression   GERD (gastroesophageal reflux disease)    History of falling 04/03/2022   History of meniscal tear    Humeral surgical neck fracture 05/16/2022   Hyperlipidemia    Hypertension    Hypokalemia 12/12/2022   Hypothyroidism    Late onset Alzheimer's disease with behavioral disturbance (HCC)     Leukocytosis 10/07/2020   Mild pulmonary hypertension (HCC)    Moderate asthma without complication 12/12/2022   Multifocal pneumonia 12/09/2022   Overactive bladder    Peripheral vascular disease (HCC)    Puncture wound of forehead 06/11/2023   Seasonal allergic rhinitis    Severe sepsis (HCC) 02/07/2022   Sleep apnea     SH:  Past Surgical History:  Procedure Laterality Date   APPENDECTOMY  1946   BASAL CELL CARCINOMA EXCISION     2006 and 2009 removed from back and lip   CARDIOVASCULAR STRESS TEST  2015   nuclear cardiac stress test negative for ischemia - Dr. Roosevelt   CATARACT EXTRACTION, BILATERAL  2006   COLONOSCOPY  2014   COLONOSCOPY N/A 08/19/2020   Procedure: COLONOSCOPY;  Surgeon: Maryruth Ole DASEN, MD;  Location: [facility] ENDOSCOPY;  Service: Endoscopy;  Laterality: N/A;   CORONARY STENT INTERVENTION N/A 05/19/2024   Procedure: CORONARY STENT INTERVENTION;  Surgeon: Mady Bruckner, MD;  Location: [facility] INVASIVE CV LAB;  Service: Cardiovascular;  Laterality: N/A;   ECTOPIC PREGNANCY SURGERY  1957   ESOPHAGOGASTRODUODENOSCOPY N/A 08/19/2020   Procedure: ESOPHAGOGASTRODUODENOSCOPY (EGD);  Surgeon: Maryruth Ole DASEN, MD;  Location: [facility] ENDOSCOPY;  Service: Endoscopy;  Laterality: N/A;   INJECTION KNEE Left 05/2015   LEFT HEART CATH AND CORONARY ANGIOGRAPHY N/A 10/12/2021   Procedure:  LEFT HEART CATH AND CORONARY ANGIOGRAPHY;  Surgeon: Hester Wolm PARAS, MD;  Location: [facility] INVASIVE CV LAB;  Service: Cardiovascular;  Laterality: N/A;   REPLACEMENT TOTAL KNEE Left 09/06/2016   Dr. Chesley   RIGHT/LEFT HEART CATH AND CORONARY ANGIOGRAPHY N/A 05/19/2024   Procedure: RIGHT/LEFT HEART CATH AND CORONARY ANGIOGRAPHY;  Surgeon: Mady Bruckner, MD;  Location: [facility] INVASIVE CV LAB;  Service: Cardiovascular;  Laterality: N/A;   TONSILLECTOMY AND ADENOIDECTOMY  1953   TOTAL ABDOMINAL HYSTERECTOMY  1986   [facility] B    ALL:  Allergies  Allergen Reactions   Pantoprazole  Nausea  And Vomiting   Metformin And Related Diarrhea    MED: (Not in a hospital admission)   All home medications have been reviewed as documented in the medication reconciliation portion of the patient record.  FH:  Family History  Problem Relation Age of Onset   Hypertension Mother    Heart disease Father    CAD Father    Heart disease Sister    Diabetes Sister    Diabetes Paternal Uncle    Sleep apnea Son     Social:  reports that she has never smoked. She has never used smokeless tobacco. She reports that she does not currently use alcohol. She reports that she does not use drugs.  Review of Systems: General: Denies fever, chills, weight loss Eyes: Denies blurry vision, changes in vision ENT: Denies sore throat, congestions, nosebleeds CV: Denies chest pain, palpitations Respiratory: Denies shortness of breath, wheezing, cough Gl: Denies abdominal pain, nausea, vomiting GU: Denies hematuria Integumentary: Denies rashes or lesions Neuro: Denies headache, dizziness Psych: Negative Hem/Onc: Denies easy bruising or bleeding disorders Musculoskeletal: See HPI above.  Vitals: BP 131/82   Pulse (!) 117   Temp 99.3 F (37.4 C)   Resp 16   Ht 5' 6 (1.676 m)   Wt 72.6 kg   SpO2 100%   BMI 25.82 kg/m    Physical Exam: General: Awake, alert and oriented, no acute distress. Eyes: Pupils reactive, EOMI, normal conjunctiva, no scleral icterus. HENT: Normocephalic, atraumatic, normal hearing, moist oral mucosa Neck: Supple, non-tender, no cervical lymphadenopathy. Lungs: Chest rise is symmetric, non-labored respiration, chest wall nontender to palpation Heart: Normal rate by palpation, normal peripheral perfusion Abdomen: Soft, non-tender, non-distended. Pelvis is stable. Skin: Skin envelope intact, dry and pink, no rashes or lesions, no signs of infection. Neurologic: Awake, alert, and oriented X3 Psychiatric: Cooperative, appropriate mood and affect.  Musculoskeletal:  Evaluation of the patient's bilateral upper extremities and right lower extremity reveal no areas of tenderness or limitations to range of motion. Any motion about the patient's left hip is extremely painful. Palpation of the left distal femur, knee, tib-fib region, ankle and foot show no areas of tenderness. The patient's calves are soft and nontender with no palpable cords. Homans testing is negative. Dorsalis pedis and posterior tibial pulse are 2+ and symmetric. The patient's toes are pink and warm with a brisk capillary refill time. The patient is able to gently move their ankles and toes on command. Sensation is intact over all lower extremity dermatomal patterns.   Radiographic findings: Multiple views of the patient's pelvis and left proximal femur reveal a highly comminuted displaced and angulated intertrochanteric hip fracture.  It does appear that the greater trochanter is actually fractured off at the basicervical region.  Mild degenerative changes are noted in both hip joints.  No acute pelvic fractures are noted.   Labs:  Recent Labs    07/04/24 1827  HGB 12.5   Recent Labs    07/04/24 1827  WBC 13.0*  RBC 5.15*  HCT 40.0  PLT 438*   Recent Labs    07/04/24 1827  NA 136  K 3.8  CL 103  CO2 19*  BUN 18  CREATININE 0.84  GLUCOSE 229*  CALCIUM  8.9   No results for input(s): LABPT, INR in the last 72 hours.   Assessment/Plan:    Assessment: 89 year old female nursing home patient with closed left highly comminuted intertrochanteric left hip fracture.   Plan: The patient's family at bedside was updated on the patient's current condition.  Their questions were answered.  I discussed being on Eliquis  it is advisable to wait at least 48 hours after the last dose of Eliquis  to prevent excessive bleeding at the time of surgery.  We discussed that at her age and bone quality we certainly have to be concerned of decreased bone density leading to complications with  healing and screw cut out.  The patient will be admitted to the hospitalist service where she will undergo medical optimization and clearance for surgery, most likely Monday afternoon because of the Eliquis .  We discussed that they are usually in the hospital approximately 2 days after surgery before they are stable for discharge back to their nursing home.  After discussing the significant risk of bone healing issues and medical problems with the patient and her daughter they would like to proceed with open reduction/internal fixation with a trochanteric nail system when medically cleared.  The patient and her daughter have been extensively counseled on the potential benefits versus risks of surgery including but not limited to postoperative nausea, wound healing issues, infection, blood clots, breakage or loosening of any metallic implants or anchors, limb length inequality, dislocation, and possible need for future revision surgery. We also discussed the significant medical risks of stroke, myocardial infarction, anesthetic complications, or exacerbation of their diabetes mellitus. After discussing the multiple pros and cons, they would like to proceed with surgery as discussed. Their consent was reviewed and signed and placed in the chart.   Cordella Lamar Knack M.D. 07/04/2024 8:32 PM

## 2024-07-04 NOTE — ED Notes (Signed)
 Lab called to obtain cmp & cbc.

## 2024-07-05 DIAGNOSIS — Z01810 Encounter for preprocedural cardiovascular examination: Secondary | ICD-10-CM

## 2024-07-05 DIAGNOSIS — I480 Paroxysmal atrial fibrillation: Secondary | ICD-10-CM | POA: Diagnosis not present

## 2024-07-05 DIAGNOSIS — S72002A Fracture of unspecified part of neck of left femur, initial encounter for closed fracture: Secondary | ICD-10-CM | POA: Diagnosis not present

## 2024-07-05 DIAGNOSIS — W19XXXA Unspecified fall, initial encounter: Secondary | ICD-10-CM | POA: Diagnosis not present

## 2024-07-05 DIAGNOSIS — I5032 Chronic diastolic (congestive) heart failure: Secondary | ICD-10-CM | POA: Diagnosis not present

## 2024-07-05 DIAGNOSIS — I482 Chronic atrial fibrillation, unspecified: Secondary | ICD-10-CM | POA: Diagnosis not present

## 2024-07-05 DIAGNOSIS — I2510 Atherosclerotic heart disease of native coronary artery without angina pectoris: Secondary | ICD-10-CM | POA: Diagnosis not present

## 2024-07-05 LAB — CBC
HCT: 40.1 % (ref 36.0–46.0)
Hemoglobin: 12.2 g/dL (ref 12.0–15.0)
MCH: 24.3 pg — ABNORMAL LOW (ref 26.0–34.0)
MCHC: 30.4 g/dL (ref 30.0–36.0)
MCV: 79.7 fL — ABNORMAL LOW (ref 80.0–100.0)
Platelets: 378 K/uL (ref 150–400)
RBC: 5.03 MIL/uL (ref 3.87–5.11)
RDW: 15.4 % (ref 11.5–15.5)
WBC: 15 K/uL — ABNORMAL HIGH (ref 4.0–10.5)
nRBC: 0 % (ref 0.0–0.2)

## 2024-07-05 LAB — BASIC METABOLIC PANEL WITH GFR
Anion gap: 10 (ref 5–15)
BUN: 17 mg/dL (ref 8–23)
CO2: 22 mmol/L (ref 22–32)
Calcium: 8.5 mg/dL — ABNORMAL LOW (ref 8.9–10.3)
Chloride: 101 mmol/L (ref 98–111)
Creatinine, Ser: 0.88 mg/dL (ref 0.44–1.00)
GFR, Estimated: >60 mL/min (ref >60)
Glucose, Bld: 192 mg/dL — ABNORMAL HIGH (ref 70–99)
Potassium: 4.1 mmol/L (ref 3.5–5.1)
Sodium: 133 mmol/L — ABNORMAL LOW (ref 135–145)

## 2024-07-05 LAB — GLUCOSE, CAPILLARY: Glucose-Capillary: 132 mg/dL — ABNORMAL HIGH (ref 70–99)

## 2024-07-05 LAB — PROTIME-INR
INR: 1.4 — ABNORMAL HIGH (ref 0.8–1.2)
Prothrombin Time: 17.7 s — ABNORMAL HIGH (ref 11.4–15.2)

## 2024-07-05 LAB — APTT: aPTT: 28 s (ref 24–36)

## 2024-07-05 LAB — CBG MONITORING, ED
Glucose-Capillary: 194 mg/dL — ABNORMAL HIGH (ref 70–99)
Glucose-Capillary: 208 mg/dL — ABNORMAL HIGH (ref 70–99)
Glucose-Capillary: 153 mg/dL — ABNORMAL HIGH (ref 70–99)

## 2024-07-05 MED ORDER — SPIRONOLACTONE 12.5 MG HALF TABLET
12.5000 mg | ORAL_TABLET | Freq: Every day | ORAL | Status: DC
  Administered 2024-07-05: 12.5 mg via ORAL
  Filled 2024-07-05: qty 1

## 2024-07-05 MED ORDER — CHLORHEXIDINE GLUCONATE CLOTH 2 % EX PADS
6.0000 | MEDICATED_PAD | Freq: Every day | CUTANEOUS | Status: DC
  Administered 2024-07-06: 6 via TOPICAL

## 2024-07-05 MED ORDER — ENOXAPARIN SODIUM 80 MG/0.8ML IJ SOSY
1.0000 mg/kg | PREFILLED_SYRINGE | Freq: Two times a day (BID) | INTRAMUSCULAR | Status: DC
  Filled 2024-07-05: qty 0.72

## 2024-07-05 MED ORDER — METOPROLOL TARTRATE 25 MG PO TABS
25.0000 mg | ORAL_TABLET | Freq: Four times a day (QID) | ORAL | Status: DC
Start: 2024-07-05 — End: 2024-07-10
  Administered 2024-07-05 – 2024-07-10 (×20): 25 mg via ORAL
  Filled 2024-07-05 (×22): qty 1

## 2024-07-05 MED ORDER — ASPIRIN 81 MG PO TBEC
81.0000 mg | DELAYED_RELEASE_TABLET | Freq: Every day | ORAL | Status: DC
  Administered 2024-07-05 – 2024-07-07 (×3): 81 mg via ORAL
  Filled 2024-07-05 (×3): qty 1

## 2024-07-05 MED ORDER — LACTATED RINGERS IV SOLN: INTRAVENOUS | Status: DC

## 2024-07-05 NOTE — ED Notes (Signed)
 Contacted lab in order to have 0500 blood work drawn.

## 2024-07-05 NOTE — ED Notes (Signed)
 Assumed care of pt. Report received from previous RN. Pt alert and oriented. Pt on CCM and pulse ox at this time. Pt RR even and unlabored. Pt denies any needs at this time.

## 2024-07-05 NOTE — Progress Notes (Addendum)
 Patient arrieved at 2040 from the ED. Patient C/O left hip FX. PRN morphine  given as ordered. Patient requested a lunch tray. Sandwich, applesauce, and graham crackers given.Patient has orders to keep HOB at 30 degrees, patient refuses states it hurts to have her head up. Refused SCD at this time.  Call light within reach

## 2024-07-05 NOTE — Progress Notes (Signed)
 Progress Note   Patient: Alison Richardson FMW:[id number] DOB: 10-30-1933 DOA: 07/04/2024     1 DOS: the patient was seen and examined on 07/05/2024   Brief hospital course: Peggi Yono is a 89 y.o. female with medical history significant of CAD, recent NSTEMI with stent placement, dCHF, chronic cor pulmonale, A fib on Eliquis , HTN, HDL, DM, asthma, PVD, hypothyroidism, dementia, depression with anxiety, GERD, who presents with fall and left hip pain.  She sustained a left hip fracture, seen by orthopedic surgery, scheduled for surgery on Monday. Patient also had coronary stents placed 2 months ago, he is seen by cardiology to clear for surgery   Principal Problem:   Closed left hip fracture (HCC) Active Problems:   Chronic atrial fibrillation with RVR (HCC)   Fall at home, initial encounter   CAD (coronary artery disease)   Chronic diastolic CHF (congestive heart failure) (HCC)   Cor pulmonale, chronic (HCC)   Type 2 diabetes mellitus with peripheral neuropathy (HCC)   Hyperlipidemia associated with type 2 diabetes mellitus (HCC)   Hypothyroidism   Asthma   Nausea & vomiting   Late onset Alzheimer's disease without behavioral disturbance (HCC)   Depression with anxiety   Leukocytosis   Paroxysmal atrial fibrillation (HCC)   Preop cardiovascular exam   Assessment and Plan: Closed left hip fracture (HCC) Fall at home, initial encounter  X-ray showed acute comminuted intertrochanteric fracture of the left femur.  She did not have syncope. Cardiology has cleared her for surgery, which is scheduled on Monday.  Continue symptomatic treatment and pain medicine.  Gentle rehydration.  Hold diuretics.   Chronic atrial fibrillation with RVR [facility]):  Patient is followed by cardiology, heart rate is better.  Currently on beta-blocker.  Discontinued Eliquis  due to surgery.   CAD (coronary artery disease):  Patient has stent placement. Patient was seen by cardiology, cleared for surgery.   Will continue aspirin , hold off Plavix  and Eliquis .    Chronic diastolic CHF and Cor pulmonale, chronic : 2D echo on 06/18/2024 showed EF 60 to 65%.  Patient does not have leg edema or JVD.  No SOB.  CHF is compensated. Hold off diuretics due to surgery.   Type 2 diabetes mellitus with peripheral neuropathy [facility]): Recent A1c 8.2, poorly controlled.  Patient is taking Ozempic  - SSI   Hyperlipidemia associated with type 2 diabetes mellitus (HCC) -Pravastatin    Hypothyroidism - Synthroid    Nausea, vomiting:  Doing better today   Asthma: Stable -Bronchodilators as needed Mucinex    Late onset Alzheimer's disease without behavioral disturbance (HCC): -Donepezil    Depression with anxiety - Venlafaxine    Leukocytosis: WBC 13.0, no fever, no signs of infection.  Likely reactive. 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 -F/u with PCP        Subjective:  Patient still complaining of left leg pain, receiving pain medicine.  Denies any short of breath or cough.  Physical Exam: Vitals:   07/05/24 0810 07/05/24 0927 07/05/24 1056 07/05/24 1200  BP: 117/76 112/69 125/67 114/64  Pulse: (!) 107 (!) 105 97 96  Resp: 12  (!) 24 15  Temp:   98.3 F (36.8 C)   TempSrc:   Oral   SpO2: 100%  96% 99%  Weight:      Height:  General exam: Appears calm and comfortable  Respiratory system: Clear to auscultation. Respiratory effort normal. Cardiovascular system: Irregular. No JVD, murmurs, rubs, gallops or clicks. No pedal edema. Gastrointestinal system: Abdomen is nondistended, soft and nontender. No organomegaly or masses felt. Normal bowel sounds heard. Central nervous system: Alert and oriented. No focal neurological deficits. Extremities: Symmetric 5 x 5  power. Skin: No rashes, lesions or ulcers Psychiatry: Judgement and insight appear normal. Mood & affect appropriate.    Data Reviewed:  X-ray and lab results reviewed.  Family Communication: Daughter updated at bedside.  Disposition: Status is: Inpatient Remains inpatient appropriate because: Severity of disease, IV treatment     Time spent: 35 minutes  Author: Murvin Mana, MD 07/05/2024 1:33 PM  For on call review [email or web address].

## 2024-07-05 NOTE — ED Notes (Signed)
 Verbal given by cardiologist, MD about DC Cardizem  drip 30 mins after administration of Metoprolol .

## 2024-07-05 NOTE — ED Notes (Signed)
 Pt placed on 2L [place] due to her oxygen saturation decreasing to 88% while sleeping. Once awake pt's oxygen saturation returned to 97% room air.

## 2024-07-05 NOTE — Hospital Course (Addendum)
 Alison Richardson is a 89 y.o. female with medical history significant of CAD, recent NSTEMI with stent placement, dCHF, chronic cor pulmonale, A fib on Eliquis , HTN, HDL, DM, asthma, PVD, hypothyroidism, dementia, depression with anxiety, GERD, presented to hospital with fall and left hip pain.  In the ED patient was noted to have left hip fracture and underwent  ORIF 7/14.  At this time patient is awaiting for skilled nursing facility placement.  Assessment and Plan:   Closed left hip fracture secondary to Fall at home, initial encounter  X-ray showed acute comminuted intertrochanteric fracture of the left femur. Cardiology  cleared her for surgery, patient had ORIF 7/14 by orthopedics.  PT has seen the patient and recommended skilled nursing facility placement..  Leukocytosis, reactive.  Has resolved    Acute hypoxemia post op.  Resolved, currently on RA     Chronic atrial fibrillation with RVR:  Continue Eliquis , metoprolol .  Amiodarone  was discontinued on 717 due to prolonged QTc.  Midodrine  was initiated 7/16.     CAD (coronary artery disease):  Status post stent placement.  Continue Plavix  and Eliquis .    Chronic diastolic CHF and Cor pulmonale, chronic :  2D echo on 06/18/2024 showed EF 60 to 65%.  Current compensated.  On spironolactone  daily and torsemide  at home as needed.  Will restart on discharge.    Hypophosphatemia:  Replenished and improved.  Latest phosphorus of 3.6.   Anemia secondary to iron  deficiency and low B12 level .  1 unit of packed RBC during hospitalization with recent hemoglobin of 9.9 and has remained stable..   Iron  deficiency, Tsat 3%, Venofer  300 mg IV x 2 doses 7/16, & 7/17 start oral supplement with vitamin C . Follow-up with PCP to repeat iron  profile after 3 to 68-month   Vitamin D  deficiency: started vitamin D  50,000 units p.o. weekly, follow with PCP to repeat vitamin D  level after 3 to 6 months.   Vitamin B12 level 210, goal >400: Started vitamin B12  1000 mcg IM injection daily during hospital stay, followed by oral supplement.  Follow-up PCP to repeat vitamin B12 level after 3 to 6 months.    Type 2 diabetes mellitus with peripheral neuropathy (HCC):  Hemoglobin A1c of 8.2.  Recent hemoglobin A1c of 8.2.  On Ozempic  as outpatient.  Continue sliding scale insulin  long-acting insulin  diabetic diet while in the hospital   Hyperlipidemia associated with type 2 diabetes mellitus  Continue pravastatin    Hypothyroidism:  Synthroid    Nausea, vomiting: Resolved.   Asthma: Stable Continue bronchodilators Mucinex .   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 Plan to follow-up as outpatient with PCP.   Constipation:  Continue MiraLAX  Dulcolax as necessary.

## 2024-07-05 NOTE — Progress Notes (Signed)
 ORTHOPEDIC PROGRESS NOTE  SUBJECTIVE:     The patient is alert and oriented x 3. The patient is resting quietly in bed. The patient reports their pain is controlled with medications.  The patient has been transferred from ED room 7 to a holding room on a hospital bed awaiting a room upstairs.  Daughter is at bedside.  Cardiology has seen the patient and she is optimized for surgery tomorrow.  OBJECTIVE:  Vitals:   07/05/24 0927 07/05/24 1056  BP: 112/69 125/67  Pulse: (!) 105 97  Resp:  (!) 24  Temp:  98.3 F (36.8 C)  SpO2:  96%    The patient's dressing is clean, dry, and intact. The patient's bilateral lower extremities are neurovascularly intact.  Their toes are pink and warm with brisk capillary refill time.  Dorsalis pedis and posterior tibial pulse are 2+ and symmetric. The patient's calves are soft, nontender, with a negative Homans test bilaterally.  The patient's left hip exam is stable and unchanged.  Labs:  Recent Labs    07/04/24 1827 07/05/24 0513  HGB 12.5 12.2   Recent Labs    07/04/24 1827 07/05/24 0513  WBC 13.0* 15.0*  RBC 5.15* 5.03  HCT 40.0 40.1  PLT 438* 378   Recent Labs    07/04/24 1827 07/05/24 0906  NA 136 133*  K 3.8 4.1  CL 103 101  CO2 19* 22  BUN 18 17  CREATININE 0.84 0.88  GLUCOSE 229* 192*  CALCIUM  8.9 8.5*   Recent Labs    07/05/24 0906  INR 1.4*      ASSESSMENT:  Assessment: 89 year old female nursing home patient with closed left highly comminuted intertrochanteric left hip fracture.     PLAN:  Discussed the patient's current status with her daughter.  Cardiology was in the room and we were able to formulate a plan of continuing her baby aspirin  for her recent stents but holding all of the blood thinners until surgery as we wait for the Eliquis  levels to decrease to have safe surgery.  Discussed the patient is tentatively scheduled for after lunch tomorrow for open reduction/internal fixation with a trochanteric  nail for her highly comminuted displaced intertrochanteric left fracture.  The patient and her daughter are in agreement to proceed with surgery as planned.  We discussed that we would resume her Eliquis  approximately 12 hours after surgery. N.p.o. after midnight.   Cordella Lamar Knack M.D. 07/05/2024 11:03 AM

## 2024-07-05 NOTE — ED Notes (Signed)
 Meal tray provided. Tray checked for potential hazards. Pt denies no other needs at this time.

## 2024-07-05 NOTE — Consult Note (Signed)
 Cardiology Consultation   Patient ID: Alison Richardson MRN: [id number]; DOB: July 14, 1933  Admit date: 07/04/2024 Date of Consult: 07/05/2024  PCP:  Alison Hadassah SQUIBB, MD   [facility] Providers Cardiologist:  Alison Lunger, MD        Patient Profile: Alison Richardson is a 89 y.o. female with a hx of CAD, persistent atrial fibrillation, HFpEF, valvular heart disease, type 2 diabetes, hypertension, hyperlipidemia, and hyperthyroidism who is being seen 07/05/2024 for the preoperative cardiac risk assessment at the request of Dr. Hilma.  History of Present Illness:   She previously been followed by [facility] cardiology.  Recommend imaging outlined below.  She underwent left heart catheterization in 09/2021 which showed ostial proximal RCA stenosis 99%, distal RCA stenosis 65%, RPDA stenosis 60%, D1 stenosis 90%, ostial left circumflex to proximal left circumflex 25% stenosis, and ostial LAD to proximal LAD 25% stenosis.  LV systolic function estimated at 35-45%.  Given complex lesions that were not ideal for PCI, medical management was recommended.  Echocardiogram in 10/2021 showed an EF of 45-50%, no RWMA, normal LV diastolic function, normal RV systolic function and size, mild mitral regurgitation.  Follow-up echoes have demonstrated normalization of LV systolic function.   She establish care with Alison Richardson in 11/2023.  Echocardiogram in 12/2023 showed an EF of 60 to 65%, no RWMA, normal RV systolic function and size, mildly elevated PASP.  Mildly to moderately mitral regurgitation and mild to moderate tricuspid regurgitation.   She was admitted to [facility] 03/2024 with progressive dyspnea treated for acute hypoxic respiratory failure with sepsis secondary to pneumonia.   Was evaluated at her PCPs office on 05/05/2024 for routine follow-up.  Blood pressure was soft at 93/57.  Valsartan  was discontinued.  She was started on Ozempic .   She had recently been admitted from 5/16 - 05/09/2024 with continued  lightheadedness and dizziness with positional changes.  Blood pressure 101/59.  High-sensitivity troponin negative x 2.  Chest x-ray with bibasilar linear opacities left greater than right felt to favor atelectasis with stable cardiomegaly.  She received IV fluids with recommendation to discontinue diltiazem .  Uptitrating home blood pressure metoprolol  was reinitiated.   Evaluated by her PCP 05/12/2024 with reported episode of chest discomfort.  Blood pressure improved and was 123/73.  She did feel like abdominal was distended and bloated.  She was advised to take a lower dose of Toprol -XL 12.5 mg daily and torsemide  for weight gain.  She was continued on Ozempic .   She was admitted to [facility] on 05/13/2024 with an illness and onset of shortness of breath 85/21 when lying down.  On arrival EMS had already given Solu-Medrol  and DuoNebs.  She was found to be hypoxic at 87% on 4 L of supplemental oxygen.  In the setting she was transition to nasal cannula followed by BiPAP.  High-sensitivity troponin of 14 with a delta troponin of 108.  D-dimer 1.2, lactic acid 2.8 trended 3.1, BNP 1890, hemoglobin 12.3, WBCs of 24.4.  COVID, influenza and RSV were negative.  Chest x-ray with interval development of interstitial pulmonary edema versus stable cardiomegaly.  She was given nitro and subsequently transition to a nitro drip as well as IV Lasix  40 mg due to she was also placed on a heparin  drip.  Pulmonary was consulted.  There was concern that it may have been amiodarone  associated lung disease.  She had no evidence of COPD on prior spirometry.  Ultimately amiodarone  was discontinued.  She underwent PCI on 5/27 with 2 DES placed  to the right coronary artery.  She was started on aspirin , Plavix , and apixaban  with plans to discontinue aspirin  after 1 week.  By reevaluation on 5/28 patient had no prior oxygen requirements reported dyspnea completely resolved.  She was able to ambulate about the floor without issue without  evidence of desaturation.  Right and left heart catheterization revealed normal right and left heart filling pressures.  Pulmonary suspect hypoxic respiratory failure largely cardiac in origin.  By 528 she was anxious to return home.  Home health arrangements were made prior to discharge.  She was also to follow-up with advanced heart failure on discharge.   She followed up with advanced heart failure on 06/04/2022 chief complaint of fatigue.  Associated shortness of breath, cough, runny nose, back pain, neuropathy, dizziness, but denied any chest pain, or peripheral edema.  Euvolemic on exam with NYHA class III symptoms.  No escalation of GDMT was made she was sent for labs.  The patient stated that she had had side effects with SGLT2 inhibitors in the past but could not remember the side effect.  With low blood pressure with orthostatic symptoms she did not tolerate MRA or beta-blockers  Patient was most recently seen in our office 06/01/2024 and overall doing well from a cardiac perspective with some ongoing shortness of breath. She was started on spironolactone  for further optimization of GDMT.     Ms. Hink reports feeling sick with a GI illness for the past few days. She was starting to feel yesterday and walked to the kitchen to make herself something to eat. She returned to the living room with her walker and is unsure if she lost her footing or tripped on something which caused her to fall onto the floor. She landed on her left hip which is now in significant pain. She denies preceding lightheadedness or dizziness. She did not hit her head or lose consciousness. She was brought to the ED via EMS. In the ED, BP 182/88 and heart rate 115 with otherwise normal vital signs.  Pertinent labs include glucose 229, BNP 175, WBC 13.  She was found to be in atrial fibrillation with RVR.  Chest x-ray without acute disease.  X-ray of the left hip revealed acute comminuted intertrochanteric fracture of the left  femur.  CT of the head was without acute intracranial abnormality but did reveal 10 mm mass likely representing a meningioma.  Patient was started on Cardizem  drip for A-fib RVR.  Orthopedic surgery was consulted and recommended proceeding with surgery tomorrow 7/14.  Given patient's extensive cardiac history, cardiology was asked to consult for preoperative cardiac risk assessment.  At time of cardiology consult, patient is resting in bed with ongoing significant pain in the left hip.  She denies chest pain, shortness of breath, palpitations, lightheadedness, dizziness, lower extremity swelling, orthopnea, bleeding, and hematochezia.  She reports that prior to her fall, she was doing extremely well.  She was fairly active, walking to and from meals at her care facility.  She was also doing water aerobics.  There are no stairs in her facility, but she does not believe that she would be able to climb a flight of stairs due to weakness.  She is without symptoms of exertional angina or cardiac decompensation.  Past Medical History:  Diagnosis Date   Acute on chronic congestive heart failure (HCC) 10/07/2020   Arrhythmia    Atrial fibrillation and flutter (HCC) 01/06/2019   Plavix  added to eliquis  10-22 after nstemi  Last Assessment & Plan:    Marked facial hematoma post fall but ct of neck and head reportedly normal. No ha or ataxia now but is using a walker. On eliquis  and plavix      Atrial flutter (HCC) 01/2018   new onset    Basal cell carcinoma of back    Basal cell carcinoma of lip    Cerebral aneurysm    followed by [facility]   CHF (congestive heart failure) (HCC)    DDD (degenerative disc disease), lumbar    superior plate depression, L3 08/18/2014   Diabetes mellitus type II, controlled (HCC)    Diverticulosis    Dysrhythmia    Paroxysmal Supraventricular Tachycardia   Dysthymia    depression   GERD (gastroesophageal reflux disease)    History of falling 04/03/2022   History of  meniscal tear    Humeral surgical neck fracture 05/16/2022   Hyperlipidemia    Hypertension    Hypokalemia 12/12/2022   Hypothyroidism    Late onset Alzheimer's disease with behavioral disturbance (HCC)    Leukocytosis 10/07/2020   Mild pulmonary hypertension (HCC)    Moderate asthma without complication 12/12/2022   Multifocal pneumonia 12/09/2022   Overactive bladder    Peripheral vascular disease (HCC)    Puncture wound of forehead 06/11/2023   Seasonal allergic rhinitis    Severe sepsis (HCC) 02/07/2022   Sleep apnea     Past Surgical History:  Procedure Laterality Date   APPENDECTOMY  1946   BASAL CELL CARCINOMA EXCISION     2006 and 2009 removed from back and lip   CARDIOVASCULAR STRESS TEST  2015   nuclear cardiac stress test negative for ischemia - Dr. Roosevelt   CATARACT EXTRACTION, BILATERAL  2006   COLONOSCOPY  2014   COLONOSCOPY N/A 08/19/2020   Procedure: COLONOSCOPY;  Surgeon: Maryruth Ole DASEN, MD;  Location: [facility] ENDOSCOPY;  Service: Endoscopy;  Laterality: N/A;   CORONARY STENT INTERVENTION N/A 05/19/2024   Procedure: CORONARY STENT INTERVENTION;  Surgeon: Mady Bruckner, MD;  Location: [facility] INVASIVE CV LAB;  Service: Cardiovascular;  Laterality: N/A;   ECTOPIC PREGNANCY SURGERY  1957   ESOPHAGOGASTRODUODENOSCOPY N/A 08/19/2020   Procedure: ESOPHAGOGASTRODUODENOSCOPY (EGD);  Surgeon: Maryruth Ole DASEN, MD;  Location: [facility] ENDOSCOPY;  Service: Endoscopy;  Laterality: N/A;   INJECTION KNEE Left 05/2015   LEFT HEART CATH AND CORONARY ANGIOGRAPHY N/A 10/12/2021   Procedure: LEFT HEART CATH AND CORONARY ANGIOGRAPHY;  Surgeon: Hester Wolm PARAS, MD;  Location: [facility] INVASIVE CV LAB;  Service: Cardiovascular;  Laterality: N/A;   REPLACEMENT TOTAL KNEE Left 09/06/2016   Dr. Chesley   RIGHT/LEFT HEART CATH AND CORONARY ANGIOGRAPHY N/A 05/19/2024   Procedure: RIGHT/LEFT HEART CATH AND CORONARY ANGIOGRAPHY;  Surgeon: Mady Bruckner, MD;  Location: [facility] INVASIVE CV  LAB;  Service: Cardiovascular;  Laterality: N/A;   TONSILLECTOMY AND ADENOIDECTOMY  1953   TOTAL ABDOMINAL HYSTERECTOMY  1986   DU B       Scheduled Meds:  donepezil   10 mg Oral QHS   famotidine   20 mg Oral BID AC   gabapentin   100 mg Oral QHS   insulin  aspart  0-5 Units Subcutaneous QHS   insulin  aspart  0-9 Units Subcutaneous TID WC   levothyroxine   88 mcg Oral Q0600   lidocaine   1 patch Transdermal Q24H   losartan   12.5 mg Oral Daily   pravastatin   20 mg Oral QHS   venlafaxine  XR  75 mg Oral Daily   Continuous Infusions:  diltiazem  (CARDIZEM ) infusion  7.5 mg/hr (07/05/24 0717)   lactated ringers  75 mL/hr at 07/05/24 0738   PRN Meds: acetaminophen , albuterol , dextromethorphan -guaiFENesin , hydrALAZINE , methocarbamol , metoprolol  tartrate, morphine  injection, nitroGLYCERIN , ondansetron  (ZOFRAN ) IV, oxyCODONE -acetaminophen , senna-docusate, torsemide   Allergies:    Allergies  Allergen Reactions   Pantoprazole  Nausea And Vomiting   Metformin And Related Diarrhea    Social History:   Social History   Socioeconomic History   Marital status: Widowed    Spouse name: Not on file   Number of children: 5   Years of education: Not on file   Highest education level: Not on file  Occupational History   Not on file  Tobacco Use   Smoking status: Never   Smokeless tobacco: Never  Vaping Use   Vaping status: Never Used  Substance and Sexual Activity   Alcohol use: Not Currently   Drug use: Never   Sexual activity: Not Currently  Other Topics Concern   Not on file  Social History Narrative   Not on file   Social Drivers of Health   Financial Resource Strain: Low Risk  (05/20/2024)   Overall Financial Resource Strain (CARDIA)    Difficulty of Paying Living Expenses: Not hard at all  Food Insecurity: No Food Insecurity (05/20/2024)   Hunger Vital Sign    Worried About Running Out of Food in the Last Year: Never true    Ran Out of Food in the Last Year: Never true   Transportation Needs: No Transportation Needs (05/20/2024)   PRAPARE - Administrator, Civil Service (Medical): No    Lack of Transportation (Non-Medical): No  Physical Activity: Insufficiently Active (04/09/2023)   Exercise Vital Sign    Days of Exercise per Week: 4 days    Minutes of Exercise per Session: 20 min  Stress: No Stress Concern Present (04/09/2023)   [employer] of Occupational Health - Occupational Stress Questionnaire    Feeling of Stress : Not at all  Social Connections: Socially Isolated (05/14/2024)   Social Connection and Isolation Panel    Frequency of Communication with Friends and Family: More than three times a week    Frequency of Social Gatherings with Friends and Family: More than three times a week    Attends Religious Services: Never    Database administrator or Organizations: No    Attends Banker Meetings: Never    Marital Status: Widowed  Intimate Partner Violence: Not At Risk (05/14/2024)   Humiliation, Afraid, Rape, and Kick questionnaire    Fear of Current or Ex-Partner: No    Emotionally Abused: No    Physically Abused: No    Sexually Abused: No    Family History:    Family History  Problem Relation Age of Onset   Hypertension Mother    Heart disease Father    CAD Father    Heart disease Sister    Diabetes Sister    Diabetes Paternal Uncle    Sleep apnea Son      ROS:  Please see the history of present illness.    Physical Exam/Data: Vitals:   07/05/24 0600 07/05/24 0630 07/05/24 0800 07/05/24 0810  BP: 112/72 118/62 (!) 121/98 117/76  Pulse: 99 99 (!) 108 (!) 107  Resp: 13 17 16 12   Temp:      TempSrc:      SpO2: 100% 95% 100% 100%  Weight:      Height:        Intake/Output Summary (Last 24 hours) at 07/05/2024  9140 Last data filed at 07/05/2024 0654 Gross per 24 hour  Intake 43.99 ml  Output 250 ml  Net -206.01 ml      07/04/2024    6:23 PM 06/01/2024    3:36 PM 05/28/2024    1:29 PM  Last 3  Weights  Weight (lbs) 160 lb 165 lb 6.4 oz 164 lb 6 oz  Weight (kg) 72.576 kg 75.025 kg 74.56 kg     Body mass index is 25.82 kg/m.  General:  Well nourished, well developed, in no acute distress HEENT: normal Neck: no JVD Vascular: No carotid bruits; Distal pulses 2+ bilaterally Cardiac: IRIR, tachycardic, normal S1, S2; no murmur Lungs:  clear to auscultation anteriorly Abd: soft, nontender, no hepatomegaly  Ext: no edema Skin: warm and dry  Psych:  Normal affect   EKG:  The EKG was personally reviewed and demonstrates:  atrial fibrillation, rate 120 bpm Telemetry:  Telemetry was personally reviewed and demonstrates: atrial fibrillation with rates 100s-120s bpm  Relevant CV Studies:  06/18/2024 Echo complete 1. Left ventricular ejection fraction, by estimation, is 60 to 65%. The left ventricle has normal function. The left ventricle has no regional  wall motion abnormalities. Left ventricular diastolic parameters were normal.   2. Right ventricular systolic function is normal. The right ventricular size is normal. There is mildly elevated pulmonary artery systolic pressure. The estimated right ventricular systolic pressure is 43.2 mmHg.   3. The mitral valve is normal in structure. Mild to moderate mitral valve regurgitation. No evidence of mitral stenosis.   4. Tricuspid valve regurgitation is moderate.   5. The aortic valve is tricuspid. Aortic valve regurgitation is not visualized. No aortic stenosis is present.   6. The inferior vena cava is normal in size with greater than 50% respiratory variability, suggesting right atrial pressure of 3 mmHg.   LHC 05/19/2024 Conclusions: Relatively stable appearance of severe two-vessel CAD, including chronic total occlusion of D1 branch and sequential 99% proximal, 40-50% mid, and 70-80% distal RCA stenoses.  There are also moderate, nonobstructive lesions in the ostial/proximal LAD, proximal/mid LCx, and RPDA/RPAV branches. Normal left and  right heart filling pressures (mean RA 4, RV 40/5, PCWP 15, LVEDP 15 mmHg).  Prominent V waves noted on PCWP tracing suggest significant mitral regurgitation. Mild pulmonary hypertension (PA 36/15, mean 22 mmHg). Normal Fick cardiac output/index (CO 4.9 L/min, CI 2.7 L/min/m). Successful PCI to proximal and distal RCA stenoses using nonoverlapping Onyx Frontier 3.0 x 15 mm (proximal) and 2.75 x 15 mm (distal) drug-eluting stents with 0% residual stenosis and TIMI-3 flow.   Recommendations: If no evidence of bleeding or vascular injury, resume apixaban  5 mg twice daily this evening.  Anticipate therapy with apixaban , clopidogrel , and aspirin  for 1 week, after which time aspirin  can be stopped.  Continue apixaban  and clopidogrel  therapy for up to 12 months, as tolerated (could switch clopidogrel  to aspirin  at 6 months if there is concern for high bleeding risk). Maintain net even fluid balance. Escalate goal-directed medical therapy as tolerated. Aggressive secondary prevention of coronary artery disease.  Laboratory Data: High Sensitivity Troponin:  No results for input(s): TROPONINIHS in the last 720 hours.   Chemistry Recent Labs  Lab 07/04/24 1827  NA 136  K 3.8  CL 103  CO2 19*  GLUCOSE 229*  BUN 18  CREATININE 0.84  CALCIUM  8.9  GFRNONAA >60  ANIONGAP 14    Recent Labs  Lab 07/04/24 1827  PROT 7.4  ALBUMIN 4.1  AST 32  ALT 26  ALKPHOS 78  BILITOT 0.6   Lipids No results for input(s): CHOL, TRIG, HDL, LABVLDL, LDLCALC, CHOLHDL in the last 168 hours.  Hematology Recent Labs  Lab 07/04/24 1827 07/05/24 0513  WBC 13.0* 15.0*  RBC 5.15* 5.03  HGB 12.5 12.2  HCT 40.0 40.1  MCV 77.7* 79.7*  MCH 24.3* 24.3*  MCHC 31.3 30.4  RDW 15.2 15.4  PLT 438* 378   Thyroid  No results for input(s): TSH, FREET4 in the last 168 hours.  BNP Recent Labs  Lab 07/04/24 1949  BNP 175.0*    DDimer No results for input(s): DDIMER in the last 168  hours.  Radiology/Studies:  CT HEAD WO CONTRAST ( ) Result Date: 07/04/2024 IMPRESSION: 1. No acute intracranial abnormality. 2. 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 3. Similar 10 mm extra-axial mass over the left frontal convexity at the vertex without mass effect or edema likely representing a meningioma. Electronically Signed   By: Norman Gatlin M.D.   On: 07/04/2024 21:08   DG Chest 1 View Result Date: 07/04/2024 IMPRESSION: No active disease. Electronically Signed   By: Norman Gatlin M.D.   On: 07/04/2024 19:07   DG Hip Unilat W or Wo Pelvis 2-3 Views Left Result Date: 07/04/2024 IMPRESSION: Acute comminuted intertrochanteric fracture of the left femur. Electronically Signed   By: Norman Gatlin M.D.   On: 07/04/2024 19:06   Assessment and Plan:  Preoperative cardiac risk assessment   :[id number]} - Ms. Rorabaugh's perioperative risk of a major cardiac event is 6.6% according to the Revised Cardiac Risk Index (RCRI).  Therefore, she is at high risk for perioperative complications.   Her functional capacity is fair at 4.7 METs according to the [facility] Activity Status Index (DASI). According to ACC/AHA guidelines, no further cardiovascular testing needed.  The patient may proceed to surgery at acceptable risk.  Risks and benefits of surgery should be weighed with patient and family. Given recent DES placement, would recommend patient remain on Aspirin  throughout perioperative period without interruption at the discretion of ortho surgery. Eliquis  (Apixaban ) can be held for 2 days prior to surgery.  Please resume post op when felt to be safe.    Persistent atrial fibrillation with RVR - Longstanding history of persistent afib with recent hospitalization 04/2024 during which amiodarone  was discontinued due to concern for QT prolongation and possible amiodarone  toxicity - Found to be in atrial fibrillation with RVR on admission - Started on diltiazem  drip with some improvement  in rates which are now reasonably controlled at 100-120 bpm - Given history of CHF, would recommend discontinuing diltiazem   - Start metoprolol  tartrate 25 mg every 6 hours - Hold losartan  to allow BP room for rate control - Eliquis  held prior to surgery, recommend resuming post op when able in the setting of CHA2DS2VASc at least 7  Fall Closed L hip fracture - Admitted after mechanical fall onto left hip with XR revealing acute comminuted intertrochanteric fracture - Ortho surgery consulted and recommended proceeding with open reduction/internal fixation, tentatively scheduled for tomorrow 7/14  Coronary artery disease - Recent NSTEMI 04/2024 with successful PCI/DES to the proximal and distal RCA. Recommendation for triple therapy for 7 days followed by clopidogrel  and Eliquis  for at least 12 months - Denies chest pain. No symptoms of exertional angina or cardiac decompensation.  - EKG without acute ischemic changes - Continue pravastatin  20 mg daily - Eliquis  and clopidogrel  on hold in anticipation of L hip surgery, resume as soon as able -  Would recommend ASA through perioperative period as above  HFimpEF - Echo during recent admission for NSTEMI 04/2024 with EF 40-45%, repeat echo 06/18/2024 showed EF improved to 60-65% - Appears euvolemic and well compensated on exam - Hold losartan  as above, reintroduce as BP allows - Resume PTA spironolactone   Hyperlipidemia - Continue pravastatin    For questions or updates, please contact [facility] Please consult [email or web address] for contact info under    Signed, Lesley LITTIE Maffucci, PA-C  07/05/2024 8:59 AM

## 2024-07-06 ENCOUNTER — Encounter
Admission: EM | Disposition: A | Discharge status: Skilled Nursing Facility | Payer: Self-pay | Source: Skilled Nursing Facility | Attending: Student

## 2024-07-06 ENCOUNTER — Other Ambulatory Visit: Payer: Self-pay

## 2024-07-06 ENCOUNTER — Inpatient Hospital Stay

## 2024-07-06 ENCOUNTER — Encounter: Payer: Self-pay | Admitting: Internal Medicine

## 2024-07-06 ENCOUNTER — Inpatient Hospital Stay: Admitting: Certified Registered Nurse Anesthetist

## 2024-07-06 DIAGNOSIS — I482 Chronic atrial fibrillation, unspecified: Secondary | ICD-10-CM | POA: Diagnosis not present

## 2024-07-06 DIAGNOSIS — Z01810 Encounter for preprocedural cardiovascular examination: Secondary | ICD-10-CM

## 2024-07-06 DIAGNOSIS — I5032 Chronic diastolic (congestive) heart failure: Secondary | ICD-10-CM | POA: Diagnosis not present

## 2024-07-06 DIAGNOSIS — I4819 Other persistent atrial fibrillation: Secondary | ICD-10-CM | POA: Diagnosis not present

## 2024-07-06 DIAGNOSIS — I4811 Longstanding persistent atrial fibrillation: Secondary | ICD-10-CM

## 2024-07-06 DIAGNOSIS — I2510 Atherosclerotic heart disease of native coronary artery without angina pectoris: Secondary | ICD-10-CM

## 2024-07-06 DIAGNOSIS — S72002A Fracture of unspecified part of neck of left femur, initial encounter for closed fracture: Secondary | ICD-10-CM | POA: Diagnosis not present

## 2024-07-06 HISTORY — PX: ORIF HIP FRACTURE: SHX2125

## 2024-07-06 LAB — CBC
HCT: 33.5 % — ABNORMAL LOW (ref 36.0–46.0)
HCT: 29.7 % — ABNORMAL LOW (ref 36.0–46.0)
Hemoglobin: 10.2 g/dL — ABNORMAL LOW (ref 12.0–15.0)
Hemoglobin: 9.3 g/dL — ABNORMAL LOW (ref 12.0–15.0)
MCH: 24 pg — ABNORMAL LOW (ref 26.0–34.0)
MCH: 24.5 pg — ABNORMAL LOW (ref 26.0–34.0)
MCHC: 30.4 g/dL (ref 30.0–36.0)
MCHC: 31.3 g/dL (ref 30.0–36.0)
MCV: 78.8 fL — ABNORMAL LOW (ref 80.0–100.0)
MCV: 78.4 fL — ABNORMAL LOW (ref 80.0–100.0)
Platelets: 324 K/uL (ref 150–400)
Platelets: 309 K/uL (ref 150–400)
RBC: 4.25 MIL/uL (ref 3.87–5.11)
RBC: 3.79 MIL/uL — ABNORMAL LOW (ref 3.87–5.11)
RDW: 15.3 % (ref 11.5–15.5)
RDW: 15.5 % (ref 11.5–15.5)
WBC: 14.3 K/uL — ABNORMAL HIGH (ref 4.0–10.5)
WBC: 23.4 K/uL — ABNORMAL HIGH (ref 4.0–10.5)
nRBC: 0 % (ref 0.0–0.2)
nRBC: 0 % (ref 0.0–0.2)

## 2024-07-06 LAB — BASIC METABOLIC PANEL WITH GFR
Anion gap: 9 (ref 5–15)
Anion gap: 13 (ref 5–15)
BUN: 12 mg/dL (ref 8–23)
BUN: 16 mg/dL (ref 8–23)
CO2: 26 mmol/L (ref 22–32)
CO2: 22 mmol/L (ref 22–32)
Calcium: 8.6 mg/dL — ABNORMAL LOW (ref 8.9–10.3)
Calcium: 8.2 mg/dL — ABNORMAL LOW (ref 8.9–10.3)
Chloride: 101 mmol/L (ref 98–111)
Chloride: 99 mmol/L (ref 98–111)
Creatinine, Ser: 0.78 mg/dL (ref 0.44–1.00)
Creatinine, Ser: 0.86 mg/dL (ref 0.44–1.00)
GFR, Estimated: >60 mL/min (ref >60)
GFR, Estimated: >60 mL/min (ref >60)
Glucose, Bld: 177 mg/dL — ABNORMAL HIGH (ref 70–99)
Glucose, Bld: 233 mg/dL — ABNORMAL HIGH (ref 70–99)
Potassium: 4.2 mmol/L (ref 3.5–5.1)
Potassium: 4.5 mmol/L (ref 3.5–5.1)
Sodium: 136 mmol/L (ref 135–145)
Sodium: 134 mmol/L — ABNORMAL LOW (ref 135–145)

## 2024-07-06 LAB — MAGNESIUM
Magnesium: 2.1 mg/dL (ref 1.7–2.4)
Magnesium: 1.9 mg/dL (ref 1.7–2.4)

## 2024-07-06 LAB — MRSA NEXT GEN BY PCR, NASAL: MRSA by PCR Next Gen: NOT DETECTED

## 2024-07-06 LAB — GLUCOSE, CAPILLARY
Glucose-Capillary: 186 mg/dL — ABNORMAL HIGH (ref 70–99)
Glucose-Capillary: 179 mg/dL — ABNORMAL HIGH (ref 70–99)
Glucose-Capillary: 211 mg/dL — ABNORMAL HIGH (ref 70–99)
Glucose-Capillary: 243 mg/dL — ABNORMAL HIGH (ref 70–99)

## 2024-07-06 LAB — ABO/RH: ABO/RH(D): O POS

## 2024-07-06 SURGERY — OPEN REDUCTION INTERNAL FIXATION HIP
Anesthesia: General | Site: Hip | Laterality: Left

## 2024-07-06 MED ORDER — ONDANSETRON HCL 4 MG/2ML IJ SOLN
INTRAMUSCULAR | Status: AC
  Filled 2024-07-06: qty 2

## 2024-07-06 MED ORDER — OXYCODONE HCL 5 MG PO TABS: 5.0000 mg | ORAL_TABLET | Freq: Once | ORAL | Status: DC | PRN

## 2024-07-06 MED ORDER — LACTATED RINGERS IV SOLN: INTRAVENOUS | Status: DC

## 2024-07-06 MED ORDER — FENTANYL CITRATE (PF) 100 MCG/2ML IJ SOLN: 25.0000 ug | INTRAMUSCULAR | Status: DC | PRN

## 2024-07-06 MED ORDER — APIXABAN 5 MG PO TABS
5.0000 mg | ORAL_TABLET | Freq: Two times a day (BID) | ORAL | Status: DC
  Administered 2024-07-07: 5 mg via ORAL
  Filled 2024-07-06: qty 1

## 2024-07-06 MED ORDER — PHENYLEPHRINE 80 MCG/ML (10ML) SYRINGE FOR IV PUSH (FOR BLOOD PRESSURE SUPPORT)
PREFILLED_SYRINGE | INTRAVENOUS | Status: AC
Start: 2024-07-06 — End: 2024-07-06
  Filled 2024-07-06: qty 10

## 2024-07-06 MED ORDER — 0.9 % SODIUM CHLORIDE (POUR BTL) OPTIME
TOPICAL | Status: DC | PRN
  Administered 2024-07-06: 500 mL

## 2024-07-06 MED ORDER — SUCCINYLCHOLINE CHLORIDE 200 MG/10ML IV SOSY
PREFILLED_SYRINGE | INTRAVENOUS | Status: DC | PRN
  Administered 2024-07-06: 100 mg via INTRAVENOUS

## 2024-07-06 MED ORDER — PROPOFOL 10 MG/ML IV BOLUS
INTRAVENOUS | Status: AC
  Filled 2024-07-06: qty 20

## 2024-07-06 MED ORDER — LIDOCAINE HCL (PF) 2 % IJ SOLN
INTRAMUSCULAR | Status: AC
  Filled 2024-07-06: qty 5

## 2024-07-06 MED ORDER — ACETAMINOPHEN 10 MG/ML IV SOLN: 1000.0000 mg | Freq: Once | INTRAVENOUS | Status: DC | PRN

## 2024-07-06 MED ORDER — BUPIVACAINE-EPINEPHRINE (PF) 0.5% -1:200000 IJ SOLN
INTRAMUSCULAR | Status: AC
  Filled 2024-07-06: qty 30

## 2024-07-06 MED ORDER — SODIUM CHLORIDE 0.9 % IV SOLN: INTRAVENOUS | Status: AC | PRN

## 2024-07-06 MED ORDER — DEXAMETHASONE SODIUM PHOSPHATE 10 MG/ML IJ SOLN
INTRAMUSCULAR | Status: AC
Start: 2024-07-06 — End: 2024-07-06
  Filled 2024-07-06: qty 1

## 2024-07-06 MED ORDER — SUCCINYLCHOLINE CHLORIDE 200 MG/10ML IV SOSY
PREFILLED_SYRINGE | INTRAVENOUS | Status: AC
  Filled 2024-07-06: qty 10

## 2024-07-06 MED ORDER — CEFAZOLIN SODIUM-DEXTROSE 2-4 GM/100ML-% IV SOLN
INTRAVENOUS | Status: AC
  Filled 2024-07-06: qty 100

## 2024-07-06 MED ORDER — PROPOFOL 10 MG/ML IV BOLUS
INTRAVENOUS | Status: DC | PRN
  Administered 2024-07-06: 100 mg via INTRAVENOUS

## 2024-07-06 MED ORDER — DIGOXIN 0.25 MG/ML IJ SOLN
0.2500 mg | INTRAMUSCULAR | Status: AC
  Administered 2024-07-06: 0.25 mg via INTRAVENOUS
  Filled 2024-07-06: qty 2

## 2024-07-06 MED ORDER — GLUCERNA SHAKE PO LIQD
237.0000 mL | Freq: Three times a day (TID) | ORAL | Status: DC
  Administered 2024-07-07 – 2024-07-13 (×16): 237 mL via ORAL

## 2024-07-06 MED ORDER — AMIODARONE LOAD VIA INFUSION
150.0000 mg | Freq: Once | INTRAVENOUS | Status: AC
  Administered 2024-07-07: 150 mg via INTRAVENOUS
  Filled 2024-07-06: qty 83.34

## 2024-07-06 MED ORDER — SUGAMMADEX SODIUM 200 MG/2ML IV SOLN
INTRAVENOUS | Status: DC | PRN
  Administered 2024-07-06 (×2): 200 mg via INTRAVENOUS

## 2024-07-06 MED ORDER — PHENYLEPHRINE 80 MCG/ML (10ML) SYRINGE FOR IV PUSH (FOR BLOOD PRESSURE SUPPORT)
PREFILLED_SYRINGE | INTRAVENOUS | Status: AC
  Filled 2024-07-06: qty 10

## 2024-07-06 MED ORDER — SODIUM CHLORIDE 0.9 % IV SOLN: INTRAVENOUS | Status: DC | PRN

## 2024-07-06 MED ORDER — FENTANYL CITRATE (PF) 100 MCG/2ML IJ SOLN
INTRAMUSCULAR | Status: DC | PRN
  Administered 2024-07-06 (×4): 25 ug via INTRAVENOUS

## 2024-07-06 MED ORDER — PHENYLEPHRINE 80 MCG/ML (10ML) SYRINGE FOR IV PUSH (FOR BLOOD PRESSURE SUPPORT)
PREFILLED_SYRINGE | INTRAVENOUS | Status: DC | PRN
  Administered 2024-07-06 (×2): 80 ug via INTRAVENOUS
  Administered 2024-07-06 (×3): 160 ug via INTRAVENOUS

## 2024-07-06 MED ORDER — DIGOXIN 0.25 MG/ML IJ SOLN
0.1250 mg | Freq: Once | INTRAMUSCULAR | Status: AC
  Administered 2024-07-06: 0.125 mg via INTRAVENOUS
  Filled 2024-07-06: qty 2

## 2024-07-06 MED ORDER — FENTANYL CITRATE (PF) 100 MCG/2ML IJ SOLN
INTRAMUSCULAR | Status: AC
  Filled 2024-07-06: qty 2

## 2024-07-06 MED ORDER — ROCURONIUM BROMIDE 100 MG/10ML IV SOLN
INTRAVENOUS | Status: DC | PRN
  Administered 2024-07-06: 10 mg via INTRAVENOUS
  Administered 2024-07-06: 50 mg via INTRAVENOUS
  Administered 2024-07-06: 10 mg via INTRAVENOUS

## 2024-07-06 MED ORDER — CHLORHEXIDINE GLUCONATE CLOTH 2 % EX PADS
6.0000 | MEDICATED_PAD | Freq: Every day | CUTANEOUS | Status: DC
  Administered 2024-07-07 – 2024-07-12 (×7): 6 via TOPICAL

## 2024-07-06 MED ORDER — SODIUM CHLORIDE 0.9 % IV BOLUS
500.0000 mL | Freq: Once | INTRAVENOUS | Status: AC
  Administered 2024-07-06: 500 mL via INTRAVENOUS

## 2024-07-06 MED ORDER — CEFAZOLIN SODIUM-DEXTROSE 2-4 GM/100ML-% IV SOLN
2.0000 g | Freq: Four times a day (QID) | INTRAVENOUS | Status: AC
  Administered 2024-07-06 (×2): 2 g via INTRAVENOUS
  Filled 2024-07-06 (×2): qty 100

## 2024-07-06 MED ORDER — SODIUM CHLORIDE 0.9 % IV BOLUS
250.0000 mL | Freq: Once | INTRAVENOUS | Status: AC
  Administered 2024-07-06: 250 mL via INTRAVENOUS

## 2024-07-06 MED ORDER — ROCURONIUM BROMIDE 10 MG/ML (PF) SYRINGE
PREFILLED_SYRINGE | INTRAVENOUS | Status: AC
  Filled 2024-07-06: qty 10

## 2024-07-06 MED ORDER — HYDROMORPHONE HCL 1 MG/ML IJ SOLN
INTRAMUSCULAR | Status: AC
  Filled 2024-07-06: qty 1

## 2024-07-06 MED ORDER — ADULT MULTIVITAMIN W/MINERALS CH
1.0000 | ORAL_TABLET | Freq: Every day | ORAL | Status: DC
  Administered 2024-07-07 – 2024-07-14 (×8): 1 via ORAL
  Filled 2024-07-06 (×10): qty 1

## 2024-07-06 MED ORDER — BUPIVACAINE-EPINEPHRINE (PF) 0.5% -1:200000 IJ SOLN
INTRAMUSCULAR | Status: DC | PRN
  Administered 2024-07-06: 30 mL

## 2024-07-06 MED ORDER — CEFAZOLIN SODIUM-DEXTROSE 2-4 GM/100ML-% IV SOLN
2.0000 g | Freq: Four times a day (QID) | INTRAVENOUS | Status: AC
  Administered 2024-07-06: 2 g via INTRAVENOUS

## 2024-07-06 MED ORDER — DEXAMETHASONE SODIUM PHOSPHATE 10 MG/ML IJ SOLN
INTRAMUSCULAR | Status: DC | PRN
  Administered 2024-07-06: 5 mg via INTRAVENOUS

## 2024-07-06 MED ORDER — LIDOCAINE HCL (CARDIAC) PF 100 MG/5ML IV SOSY
PREFILLED_SYRINGE | INTRAVENOUS | Status: DC | PRN
  Administered 2024-07-06: 50 mg via INTRAVENOUS

## 2024-07-06 MED ORDER — AMIODARONE HCL IN DEXTROSE 360-4.14 MG/200ML-% IV SOLN
30.0000 mg/h | INTRAVENOUS | Status: DC
  Administered 2024-07-07 – 2024-07-09 (×4): 30 mg/h via INTRAVENOUS
  Filled 2024-07-06 (×5): qty 200

## 2024-07-06 MED ORDER — HYDROMORPHONE HCL 1 MG/ML IJ SOLN
INTRAMUSCULAR | Status: DC | PRN
  Administered 2024-07-06: .5 mg via INTRAVENOUS

## 2024-07-06 MED ORDER — DIGOXIN 125 MCG PO TABS
0.0625 mg | ORAL_TABLET | Freq: Every day | ORAL | Status: DC
  Administered 2024-07-07 – 2024-07-12 (×6): 0.0625 mg via ORAL
  Filled 2024-07-06 (×7): qty 0.5

## 2024-07-06 MED ORDER — PHENYLEPHRINE HCL-NACL 20-0.9 MG/250ML-% IV SOLN
INTRAVENOUS | Status: DC | PRN
Start: 2024-07-06 — End: 2024-07-06
  Administered 2024-07-06: 20 ug/min via INTRAVENOUS

## 2024-07-06 MED ORDER — OXYCODONE HCL 5 MG/5ML PO SOLN: 5.0000 mg | Freq: Once | ORAL | Status: DC | PRN

## 2024-07-06 MED ORDER — AMIODARONE HCL IN DEXTROSE 360-4.14 MG/200ML-% IV SOLN
60.0000 mg/h | INTRAVENOUS | Status: AC
  Administered 2024-07-07 (×2): 60 mg/h via INTRAVENOUS
  Filled 2024-07-06 (×2): qty 200

## 2024-07-06 SURGICAL SUPPLY — 42 items
ALCOHOL 70% 16 OZ (MISCELLANEOUS) ×1 IMPLANT
BIT DRILL CANN 16 HIP (BIT) IMPLANT
BIT DRILL CANN STP 6/9 HIP (BIT) IMPLANT
BIT DRILL LONG 4.2 (BIT) IMPLANT
BIT DRILL TAPERED 10 (BIT) IMPLANT
BNDG COHESIVE 4X5 TAN STRL LF (GAUZE/BANDAGES/DRESSINGS) ×2 IMPLANT
BNDG COHESIVE 6X5 TAN ST LF (GAUZE/BANDAGES/DRESSINGS) IMPLANT
DRAPE C-ARM XRAY 36X54 (DRAPES) ×1 IMPLANT
DRAPE SHEET LG 3/4 BI-LAMINATE (DRAPES) ×1 IMPLANT
DRAPE U-SHAPE 47X51 STRL (DRAPES) ×2 IMPLANT
DURAPREP 26ML APPLICATOR (WOUND CARE) ×1 IMPLANT
ELECTRODE REM PT RTRN 9FT ADLT (ELECTROSURGICAL) ×1 IMPLANT
GAUZE SPONGE 4X4 12PLY STRL (GAUZE/BANDAGES/DRESSINGS) ×4 IMPLANT
GAUZE XEROFORM 5X9 LF (GAUZE/BANDAGES/DRESSINGS) ×1 IMPLANT
GLOVE PI ORTHO PRO STRL SZ8 (GLOVE) ×1 IMPLANT
GLOVE SURG SYN 7.5 PF PI (GLOVE) ×2 IMPLANT
GOWN STRL REUS W/ TWL LRG LVL3 (GOWN DISPOSABLE) ×2 IMPLANT
GUIDEWIRE 3.2X400 (WIRE) IMPLANT
KIT TURNOVER KIT A (KITS) ×1 IMPLANT
MANIFOLD NEPTUNE II (INSTRUMENTS) ×1 IMPLANT
MAT ABSORB FLUID 56X50 GRAY (MISCELLANEOUS) ×1 IMPLANT
NAIL IM CANN TFNA 12X235 130D (Nail) IMPLANT
NDL HYPO 21X1.5 SAFETY (NEEDLE) ×1 IMPLANT
NEEDLE HYPO 21X1.5 SAFETY (NEEDLE) ×1 IMPLANT
NS IRRIG 1000ML POUR BTL (IV SOLUTION) ×1 IMPLANT
PACK HIP COMPR (MISCELLANEOUS) ×1 IMPLANT
PAD ABD DERMACEA PRESS 5X9 (GAUZE/BANDAGES/DRESSINGS) ×3 IMPLANT
PADDING CAST BLEND 4X4 NS (MISCELLANEOUS) ×2 IMPLANT
PENCIL SMOKE EVACUATOR (MISCELLANEOUS) ×1 IMPLANT
REAMER ROD DEEP FLUTE 2.5X950 (INSTRUMENTS) IMPLANT
SCREW FENES TFNA 95 (Screw) IMPLANT
SCREW LOCK STAR 5X40 (Screw) IMPLANT
SCRUB CHG 4% DYNA-HEX 4OZ (MISCELLANEOUS) ×1 IMPLANT
STAPLER SKIN PROX 35W (STAPLE) ×1 IMPLANT
SUCTION TUBE FRAZIER 10FR DISP (SUCTIONS) IMPLANT
SUT VIC AB 0 CT1 36 (SUTURE) ×1 IMPLANT
SUT VIC AB 2-0 CT1 (SUTURE) ×1 IMPLANT
SUT VICRYL 0 TIES 12 18 (SUTURE) IMPLANT
SYR 30ML LL (SYRINGE) ×1 IMPLANT
TAPE CLOTH SURG 4X10 WHT LF (GAUZE/BANDAGES/DRESSINGS) ×1 IMPLANT
TRAP FLUID SMOKE EVACUATOR (MISCELLANEOUS) ×1 IMPLANT
WATER STERILE IRR 1000ML POUR (IV SOLUTION) ×1 IMPLANT

## 2024-07-06 NOTE — Progress Notes (Signed)
 Progress Note   Patient: Alison Richardson FMW:[id number] DOB: 07-02-1933 DOA: 07/04/2024     2 DOS: the patient was seen and examined on 07/06/2024   Brief hospital course: Thu Baggett is a 89 y.o. female with medical history significant of CAD, recent NSTEMI with stent placement, dCHF, chronic cor pulmonale, A fib on Eliquis , HTN, HDL, DM, asthma, PVD, hypothyroidism, dementia, depression with anxiety, GERD, who presents with fall and left hip pain.  She sustained a left hip fracture, seen by orthopedic surgery, scheduled for surgery on Monday. Patient also had coronary stents placed 2 months ago, he is seen by cardiology to clear for surgery   Principal Problem:   Closed left hip fracture (HCC) Active Problems:   Chronic atrial fibrillation with RVR (HCC)   Fall at home, initial encounter   CAD (coronary artery disease)   Chronic diastolic CHF (congestive heart failure) (HCC)   Cor pulmonale, chronic (HCC)   Type 2 diabetes mellitus with peripheral neuropathy (HCC)   Hyperlipidemia associated with type 2 diabetes mellitus (HCC)   Hypothyroidism   Asthma   Nausea & vomiting   Late onset Alzheimer's disease without behavioral disturbance (HCC)   Depression with anxiety   Leukocytosis   Paroxysmal atrial fibrillation (HCC)   Preop cardiovascular exam   Assessment and Plan: Closed left hip fracture (HCC) Fall at home, initial encounter  X-ray showed acute comminuted intertrochanteric fracture of the left femur.  She did not have syncope. Cardiology has cleared her for surgery, which is scheduled today.  Continue symptomatic treatment and pain medicine.   Has better pain control today.   Chronic atrial fibrillation with RVR (HCC):  Anticoagulation on hold due to surgery.  Continue scheduled beta-blocker.  Also has as needed IV metoprolol  for tachycardia.  Asking nurse to give another dose as heart rate has been going higher.  CAD (coronary artery disease):  Patient has stent  placement. Patient was seen by cardiology, cleared for surgery.  Will continue aspirin , hold off Plavix  and Eliquis . Condition still stable.     Chronic diastolic CHF and Cor pulmonale, chronic : 2D echo on 06/18/2024 showed EF 60 to 65%.  Patient does not have leg edema or JVD.  No SOB.   Still euvolemic.  Will restart diuretics after surgery.   Type 2 diabetes mellitus with peripheral neuropathy [facility]): Recent A1c 8.2, poorly controlled.  Patient is taking Ozempic  Continue sliding scale insulin , will adjust dose.   Hyperlipidemia associated with type 2 diabetes mellitus (HCC) -Pravastatin    Hypothyroidism - Synthroid    Nausea, vomiting:  Resolved.   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 -F/u with PCP      Subjective:  Pain better, less pain.  Denies any short of breath.  Physical Exam: Vitals:   07/06/24 0627 07/06/24 0758 07/06/24 0829 07/06/24 0957  BP:  102/67 102/67 106/67  Pulse:  (!) 129 (!) 129 (!) 122  Resp:      Temp:  98.3 F (36.8 C)    TempSrc:  Oral    SpO2: 91% 98%    Weight:      Height:       General exam: Appears  calm and comfortable  Respiratory system: Clear to auscultation. Respiratory effort normal. Cardiovascular system: Irregular and tachycardic. No JVD, murmurs, rubs, gallops or clicks. No pedal edema. Gastrointestinal system: Abdomen is nondistended, soft and nontender. No organomegaly or masses felt. Normal bowel sounds heard. Central nervous system: Alert and oriented x2. No focal neurological deficits. Extremities: Symmetric 5 x 5 power. Skin: No rashes, lesions or  ulcers Psychiatry:  Mood & affect appropriate.    Data Reviewed:  Lab results reviewed.  Family Communication: Daughter updated at bedside.  Disposition: Status is: Inpatient Remains inpatient appropriate because: Severity of disease, IV treatment.  Inpatient procedure.     Time spent: 35 minutes  Author: Murvin Mana, MD 07/06/2024 11:00 AM  For on call review [email or web address].

## 2024-07-06 NOTE — Anesthesia Preprocedure Evaluation (Addendum)
 Anesthesia Evaluation  Patient identified by MRN, date of birth, ID band Patient confused    Reviewed: Allergy & Precautions, NPO status , Patient's Chart, lab work & pertinent test results  History of Anesthesia Complications Negative for: history of anesthetic complications  Airway Mallampati: IV   Neck ROM: Full    Dental no notable dental hx.    Pulmonary asthma , sleep apnea , COPD   Pulmonary exam normal breath sounds clear to auscultation       Cardiovascular hypertension, + CAD (s/p MI and stents), + Peripheral Vascular Disease and +CHF  + dysrhythmias (a fib on Eliquis , last dose 07/04/24)  Rhythm:Regular Rate:Tachycardia  ECG 07/04/24:  Atrial flutter with predominant 2:1 AV block Ventricular premature complex LAD, consider left anterior fascicular block Consider anterior infarct Nonspecific T abnormalities, lateral leads Prolonged QT interval  Echo 05/14/24:  1. Left ventricular ejection fraction, by estimation, is 40 to 45%. The left ventricle has mild to moderately decreased function. The left ventricle demonstrates global hypokinesis. Left ventricular diastolic parameters are indeterminate.   2. Right ventricular systolic function is normal. The right ventricular size is moderately enlarged. There is moderately elevated pulmonary artery systolic pressure. The estimated right ventricular systolic pressure is 54.3 mmHg.   3. The mitral valve is normal in structure. Mild mitral valve regurgitation.   4. Tricuspid valve regurgitation is mild to moderate.   5. The aortic valve is tricuspid. Aortic valve regurgitation is not visualized.   6. The inferior vena cava is normal in size with greater than 50% respiratory variability, suggesting right atrial pressure of 3 mmHg.    Neuro/Psych  PSYCHIATRIC DISORDERS Anxiety Depression   Dementia Cerebral aneurysm    GI/Hepatic ,GERD  ,,  Endo/Other  diabetes, Type  2Hypothyroidism    Renal/GU      Musculoskeletal   Abdominal   Peds  Hematology negative hematology ROS (+)   Anesthesia Other Findings Last dose of Ozempic  07/01/24.   Cardiology note 06/01/24:  ASSESSMENT AND PLAN 1. Coronary artery disease involving native coronary arteries with recent NSTEMI when she presented to the hospital with shortness of breath and chest and left shoulder pain with mildly elevated high-sensitivity troponins cardiac catheterization showed fairly stable coronary anatomy with CTO of a small high diagonal branch and severe proximal and distal RCA with antegrade collateral flow.  She underwent successful PCI/DES to the proximal and distal RCA.  Right cath site remained stable.  She was continued on triple therapy with aspirin , apixaban , clopidogrel  for 1 week, after that time aspirin  was being discontinued.  She was recommended continuation of apixaban  and clopidogrel  for minimum of 12 months as tolerated if there are concerns with high risk for bleeding clopidogrel  can be switched back to aspirin  at 6 months.  EKG today reveals sinus rhythm with rate of 74 with left axis deviation with no acute changes.  She is also continued on pravastatin  20 mg daily where LDLs remained at goal.  Given advanced age and concern for side effects she was not switched to high intensity statin.  She has been referred to cardiac rehab.  Postprocedure labs were drawn by her PCP on 05/28/2024.  Electrolytes and kidney function remained stable.   2. Chronic HFmrEF with the last LVEF of 40-45%.  Right and left heart catheterization showed normal left and right heart filling pressures and mild pulmonary hypertension with normal cardiac output/index.  There was no addition of beta-blocker therapy in the setting of borderline low resting heart rate and  COPD exacerbation during recent hospitalization.  Will continue to remain off of beta-blocker at this time.  She has been continued on losartan  12.5 mg  daily, torsemide  to 20 mg as needed daily and starting spironolactone  12.5 mg daily today to escalate GDMT as tolerated by blood pressure and kidney function.  She will have an updated BMP in 2 weeks and a repeat limited echocardiogram in 3 months.   3. Paroxysmal atrial fibrillation she has noted to be in sinus rhythm on EKG today with a rate of 74, left axis deviation, T wave abnormality, LVH, with no acute change.  She has been continued on apixaban  5 mg twice daily for CHA2DS2-VASc score of at least 7 for stroke prophylaxis.  Previously she was on amiodarone  therapy that was discontinued for concern of possible amiodarone  lung toxicity and QT prolongation.   4. Valvular heart disease with echocardiogram in 12/2023 showed mild to moderate mitral and tricuspid regurgitation.  Repeat echocardiogram in 05/14/2024 revealed mild mitral regurgitation and mild to moderate TR.  Will continue to follow with surveillance studies.   5. Recently had acute hypoxic respiratory failure that was likely multifactorial from heart failure and COPD.  Concern for amiodarone  toxicity with amiodarone  was discontinued.  She had rapid improvement with heart failure/COPD therapies.  Right and left heart catheterization showed normal left and right heart filling pressures.  Amiodarone  was discontinued on discharge.  Oxygen saturations have improved and she was no longer required supplemental oxygen therapy prior to discharge.  Continued with shortness of breath today likely multifactorial.   Reproductive/Obstetrics                              Anesthesia Physical Anesthesia Plan  ASA: 4  Anesthesia Plan: General   Post-op Pain Management:    Induction: Intravenous  PONV Risk Score and Plan: 3 and Ondansetron , Dexamethasone  and Treatment may vary due to age or medical condition  Airway Management Planned: Oral ETT  Additional Equipment:   Intra-op Plan:   Post-operative Plan: Extubation in  OR  Informed Consent: I have reviewed the patients History and Physical, chart, labs and discussed the procedure including the risks, benefits and alternatives for the proposed anesthesia with the patient or authorized representative who has indicated his/her understanding and acceptance.   Patient has DNR.  Discussed DNR with patient, Discussed DNR with power of attorney and Suspend DNR.   Dental advisory given and Consent reviewed with POA  Plan Discussed with: CRNA  Anesthesia Plan Comments: (Patient and daughter at bedside consented for risks of anesthesia including but not limited to:  - adverse reactions to medications - damage to eyes, teeth, lips or other oral mucosa - nerve damage due to positioning  - sore throat or hoarseness - damage to heart, brain, nerves, lungs, other parts of body or loss of life  Informed patient and daughter about role of CRNA in peri- and intra-operative care; they voiced understanding.)         Anesthesia Quick Evaluation

## 2024-07-06 NOTE — Anesthesia Procedure Notes (Signed)
 Procedure Name: Intubation Date/Time: 07/06/2024 1:16 PM  Performed by: Lorrene Camelia LABOR, CRNAPre-anesthesia Checklist: Patient identified, Patient being monitored, Timeout performed, Emergency Drugs available and Suction available Patient Re-evaluated:Patient Re-evaluated prior to induction Oxygen Delivery Method: Circle system utilized Preoxygenation: Pre-oxygenation with 100% oxygen Induction Type: IV induction Ventilation: Mask ventilation without difficulty Laryngoscope Size: Mac and 3 Grade View: Grade I Tube type: Oral Tube size: 7.0 mm Number of attempts: 1 Airway Equipment and Method: Stylet Placement Confirmation: ETT inserted through vocal cords under direct vision, positive ETCO2 and breath sounds checked- equal and bilateral Secured at: 22 cm Tube secured with: Tape Dental Injury: Teeth and Oropharynx as per pre-operative assessment

## 2024-07-06 NOTE — Op Note (Signed)
 ORTHOPEDIC OPERATIVE REPORT Alison Richardson [id number] 07/06/2024 Pre-op Diagnosis:  Closed comminuted left intertrochanteric proximal femur fracture Post-op Diagnosis:  Closed comminuted left intertrochanteric proximal femur fracture Procedure:  OPEN REDUCTION INTERNAL FIXATION HIP: 72754 (CPT)  Surgeon:  Cordella SAUNDERS. Maryrose, MD Assistant Surgeon:  Circulator: Burnard Leotis SAILOR, RN; Jeralyn Charlott BRAVO, RN Radiology Technologist: Bernardo Lavanda CROME, RT Relief Circulator: Tobie Selmer PARAS, RN Scrub Person: Vonzell Mitzie SQUIBB, RN Vendor Representative : Amedeo Mayo Float Surgical Tech: Viktoria Jeoffrey CROME, NT Anesthesia Type:   General Endotracheal Anesthesia Fluids:   800 ml of crystalloid. Estimated Blood Loss:   250 ml Urine output:   150 ml  Tourniquet:   No tourniquet was employed for this case. Complications:   None. Disposition:   The patient was taken to the PACU in stable condition having tolerated the procedure well. Grafts/Implants:    Implant Name Type Inv. Item Serial No. Manufacturer Lot No. LRB No. Used Action  NAIL IM CANN TFNA [id number] - [id number] Nail NAIL IM CANN TFNA [id number] [id number]  [employer] [id number] Left 1 Implanted  SCREW LOCK STAR 5X40 - [id number] Screw SCREW LOCK STAR 5X40  [employer]  Left 1 Implanted   Specimen(s):   None. Indication for surgery:   The patient is a 89 year old female who suffered a ground-level fall injuring her left hip.  She suffered a comminuted closed left intertrochanteric hip fracture.  Surgery was recommended for pain control and appropriate healing.  Please see dictated orthopedic consultation and admission history and physical examination for complete details. Operative technique:   After obtaining informed consent and having the patient medically cleared, the patient was taken to the operating room where they were placed under General Endotracheal Anesthesia in their bed for comfort. The patient was then positioned onto the  fracture table in the supine position on a well padded peroneal post. Well padded traction boots were applied and the lower extremities placed in gentle traction in the scissor position. The left hip was isolated with a nonsterile plastic U-drape. Fluoroscopy was employed to grossly align the fracture by use of the traction table.  The left hip was prepped and draped in routine fashion with Hibiclens  and alcohol with a final ChloraPrep. The shower curtain draping technique was employed. Under fluoroscopic guidance the anatomy of the greater trochanter and femoral shaft were palpated and the anatomy outlined with a marking pen. A time-out then occurred per joint commission standards to positively identify the patient, the operative site, the operation, and the surgeon. All concurred.  An incision was made in the skin approximately 5 cm in length superior to the left hip greater trochanter region in line with the shaft. Mayo scissors were used to bluntly dissect down to the tip of the greater trochanter. A guide pin was placed at the tip of the greater trochanter and the reamer used to open the canal under fluoroscopic guidance. A ball-tipped guide wire was then carefully passed down the femoral canal to the distal femur. Wire position and fracture reduction were again confirmed in both the AP and lateral planes with fluoroscopy. I then began sequentially reaming from a size 8 to a size 13 reamer. A 12 mm intermediate length 235 mm Synthes TFN-A nail was selected. The nail was placed in the jig and the alignment holes check. I then placed the nail over the guide wire and passed it gently into the femoral canal. Under fluoroscopic guidance I aligned the head and neck region and the cannula was  placed laterally through a small stab incision. The guide pin was placed up the head and neck. The length measured for 95 mm screw. I then hand tapped the hole and found the bone quality in the femoral head to be quite good.  The screw was positioned under fluoroscopic guidance to make sure it was appropriately positioned centered in the head.  At this time traction was taken off the left lower extremity to gently impact the fracture. The distal interlocking screw was placed through a small stab incision from lateral to medial in routine fashion employing the aiming device with the trocar and cannula system. Final images were obtained to make sure of appropriate fracture reduction, implant positioning, and no screws in the joint.  The incisions were copiously lavaged with normal saline by bulb syringe. The deep fascia was closed with 0 Vicryl in an interrupted pattern. The subcutaneous tissue was closed with 2-0 Vicryl in an inverted interrupted pattern. The skin was stapled. The patient was covered with a nonadherent sterile dressing and carefully positioned back into their bed. At the end the case limb lengths appeared equal. External rotation of the feet appeared equal. The toes were pink and warm with a brisk capillary time and no complications were noted from the traction boots. The patient was taken to the recovery room in stable condition having tolerated the procedure well. All sponge and needle counts were correct at the end of the case.  Implants: Synthes TFN-A Trochanteric Nail 235 mm x 12 mm with a 5 mm screw proximally.  The patient's family was updated on the patient's condition and the operative findings.    Cordella Lamar Knack M.D. 07/06/2024 3:48 PM

## 2024-07-06 NOTE — Transfer of Care (Signed)
 Immediate Anesthesia Transfer of Care Note  Patient: Alison Richardson  Procedure(s) Performed: OPEN REDUCTION INTERNAL FIXATION HIP (Left: Hip)  Patient Location: PACU  Anesthesia Type:General  Level of Consciousness: drowsy  Airway & Oxygen Therapy: Patient Spontanous Breathing and Patient connected to face mask oxygen  Post-op Assessment: Report given to RN and Post -op Vital signs reviewed and stable  Post vital signs: stable  Last Vitals:  Vitals Value Taken Time  BP 124/74 07/06/24 15:16  Temp    Pulse 126 07/06/24 15:20  Resp 14 07/06/24 15:20  SpO2 95 % 07/06/24 15:20  Vitals shown include unfiled device data.  Last Pain:  Vitals:   07/06/24 1209  TempSrc: Temporal  PainSc: 4          Complications: No notable events documented.

## 2024-07-06 NOTE — Plan of Care (Signed)
  Problem: Education: Goal: Ability to describe self-care measures that may prevent or decrease complications (Diabetes Survival Skills Education) will improve Outcome: Progressing   Problem: Metabolic: Goal: Ability to maintain appropriate glucose levels will improve Outcome: Progressing   Problem: Clinical Measurements: Goal: Ability to maintain clinical measurements within normal limits will improve Outcome: Progressing   Problem: Safety: Goal: Ability to remain free from injury will improve Outcome: Progressing

## 2024-07-06 NOTE — Discharge Instructions (Signed)
 ORTHOPEDIC DISCHARGE INSTRUCTIONS  Follow Up Appointment:  Follow-up in the office in 14 days.  Please contact the [facility] at [phone]  to schedule your follow-up appointment.  Dressing and cast care instructions:  After 48 hours, remove the large dressing and clean the incision with hydrogen peroxide.  Begin daily light, dry dressing changes.  Weight-bearing status:  You are weightbearing as tolerated on the LEFT LOWER EXTREMITY with your walker/crutches.   Diet:   You may resume your regular diet as tolerated.  Begin with clear liquids and slowly advance to your normal diet.  Medications:   Take your medicines as prescribed. You may have written prescriptions or your prescriptions may have been E-prescribed to your pharmacy. If you were given prescriptions for any blood thinners, it is a very important that you take these medications as prescribed to prevent blood clots.  General instructions:  Elevate the affected extremity to control pain and swelling. Apply ice packs to the affected area to control pain and swelling.  Surgery and pain medications may lead to constipation. It is easier to prevent this than it is to treat it. We recommend MiraLAX  or Senna/Senakot for laxatives and Colace as a stool softener as needed after surgery. Drink plenty of fluids to stay well-hydrated. Consult your local pharmacist for other treatment recommendations.   Please perform deep breathing exercises every hour to increase the airflow in your lungs which could predispose you to running a low-grade fever and increase your risk of pneumonia.

## 2024-07-06 NOTE — Progress Notes (Signed)
 Initial Nutrition Assessment  DOCUMENTATION CODES:   Not applicable  INTERVENTION:   -Once diet is advanced, add:   -Glucerna Shake po TID, each supplement provides 220 kcal and 10 grams of protein  -MVI with minerals daily  NUTRITION DIAGNOSIS:   Increased nutrient needs related to post-op healing as evidenced by estimated needs.  GOAL:   Patient will meet greater than or equal to 90% of their needs  MONITOR:   PO intake, Supplement acceptance, Diet advancement  REASON FOR ASSESSMENT:   Consult Assessment of nutrition requirement/status, Hip fracture protocol  ASSESSMENT:   Pt with medical history significant of CAD, recent NSTEMI with stent placement, dCHF, chronic cor pulmonale, A fib on Eliquis , HTN, HDL, DM, asthma, PVD, hypothyroidism, dementia, depression with anxiety, GERD, who presents with fall and left hip pain.  Pt admitted with closed lt hip fracture.   Reviewed I/O's: -438 ml x 24 hours and -644 ml since admission  UOP: 1.5 L x 24 hours  Case discussed with RN, who confirmed pt is NPO for scheduled surgery this afternoon.   Spoke with pt at bedside, who was pleasantly conufsed at time of visit. She was able to states that she was in the hospital for surgery. She is aware she is unable to eat prior to surgery. She reports good appetite PTA. She is a resident of the [facility].   Reviewed wt hx; pt has experienced a 6 4% wt loss over the past 3 months, which is not significant for time frame. Suspect some wt loss may be related to ozempic .   Discussed importance of good meal and supplement intake to promote healing.   Lab Results  Component Value Date   HGBA1C 8.2 (H) 04/09/2024   PTA DM medications are 1 mg semaglutide  daily.   Labs reviewed: CBGS: 132-208 (inpatient orders for glycemic control are 0-5 units insulin  aspart dail at bedtime and 0-9 units insulin  aspart TID with meals).    NUTRITION - FOCUSED PHYSICAL EXAM:  Flowsheet  Row Most Recent Value  Orbital Region No depletion  Upper Arm Region No depletion  Thoracic and Lumbar Region No depletion  Buccal Region No depletion  Temple Region No depletion  Clavicle Bone Region No depletion  Clavicle and Acromion Bone Region No depletion  Scapular Bone Region No depletion  Dorsal Hand No depletion  Patellar Region No depletion  Anterior Thigh Region No depletion  Posterior Calf Region No depletion  Edema (RD Assessment) Mild  Hair Reviewed  Eyes Reviewed  Mouth Reviewed  Skin Reviewed  Nails Reviewed    Diet Order:   Diet Order             Diet NPO time specified  Diet effective midnight                   EDUCATION NEEDS:   Education needs have been addressed  Skin:  Skin Assessment: Reviewed RN Assessment  Last BM:  07/02/24  Height:   Ht Readings from Last 1 Encounters:  07/06/24 5' 6 (1.676 m)    Weight:   Wt Readings from Last 1 Encounters:  07/06/24 71.5 kg    Ideal Body Weight:  59.1 kg  BMI:  Body mass index is 25.44 kg/m.  Estimated Nutritional Needs:   Kcal:  1600-1800  Protein:  85-100 grams  Fluid:  1.6-1.8 L    Margery ORN, RD, LDN, CDCES Registered Dietitian III Certified Diabetes Care and Education Specialist If unable to reach  this RD, please use RD Inpatient group chat on secure chat between hours of 8am-4 pm daily

## 2024-07-06 NOTE — Progress Notes (Signed)
 Rounding Note   Patient Name: Alison Richardson Date of Encounter: 07/06/2024  [facility] Cardiologist: Evalene Lunger, MD  Subjective Patient is somnolent on exam. She is scheduled for hip surgery this afternoon. Remains in atrial fibrillation with rates suboptimally controlled at 110-140s bpm.   Scheduled Meds:  aspirin  EC  81 mg Oral Daily   Chlorhexidine  Gluconate Cloth  6 each Topical Daily   donepezil   10 mg Oral QHS   famotidine   20 mg Oral BID AC   gabapentin   100 mg Oral QHS   insulin  aspart  0-5 Units Subcutaneous QHS   insulin  aspart  0-9 Units Subcutaneous TID WC   levothyroxine   88 mcg Oral Q0600   lidocaine   1 patch Transdermal Q24H   metoprolol  tartrate  25 mg Oral Q6H   pravastatin   20 mg Oral QHS   venlafaxine  XR  75 mg Oral Daily   Continuous Infusions:  PRN Meds: acetaminophen , albuterol , dextromethorphan -guaiFENesin , hydrALAZINE , methocarbamol , metoprolol  tartrate, morphine  injection, nitroGLYCERIN , ondansetron  (ZOFRAN ) IV, oxyCODONE -acetaminophen , senna-docusate   Vital Signs  Vitals:   07/06/24 0627 07/06/24 0758 07/06/24 0829 07/06/24 0957  BP:  102/67 102/67 106/67  Pulse:  (!) 129 (!) 129 (!) 122  Resp:      Temp:  98.3 F (36.8 C)    TempSrc:  Oral    SpO2: 91% 98%    Weight:      Height:        Intake/Output Summary (Last 24 hours) at 07/06/2024 1052 Last data filed at 07/06/2024 0500 Gross per 24 hour  Intake 1011.7 ml  Output 1450 ml  Net -438.3 ml      07/06/2024   12:00 AM 07/04/2024    6:23 PM 06/01/2024    3:36 PM  Last 3 Weights  Weight (lbs) 157 lb 10.1 oz 160 lb 165 lb 6.4 oz  Weight (kg) 71.5 kg 72.576 kg 75.025 kg      Telemetry Atrial fibrillation rate 110-140 bpm - Personally Reviewed  Physical Exam  GEN: No acute distress.   Neck: No JVD Cardiac: IRIR, tachycardic, no murmurs, rubs, or gallops.  Respiratory: Clear to auscultation anteriorly. GI: Soft, nontender, non-distended  MS: No edema Neuro:   Nonfocal  Psych: Normal affect   Labs High Sensitivity Troponin:  No results for input(s): TROPONINIHS in the last 720 hours.   Chemistry Recent Labs  Lab 07/04/24 1827 07/05/24 0906 07/06/24 0612  NA 136 133* 136  K 3.8 4.1 4.2  CL 103 101 101  CO2 19* 22 26  GLUCOSE 229* 192* 177*  BUN 18 17 12   CREATININE 0.84 0.88 0.78  CALCIUM  8.9 8.5* 8.6*  MG  --   --  2.1  PROT 7.4  --   --   ALBUMIN 4.1  --   --   AST 32  --   --   ALT 26  --   --   ALKPHOS 78  --   --   BILITOT 0.6  --   --   GFRNONAA >60 >60 >60  ANIONGAP 14 10 9     Lipids No results for input(s): CHOL, TRIG, HDL, LABVLDL, LDLCALC, CHOLHDL in the last 168 hours.  Hematology Recent Labs  Lab 07/04/24 1827 07/05/24 0513 07/06/24 0612  WBC 13.0* 15.0* 14.3*  RBC 5.15* 5.03 4.25  HGB 12.5 12.2 10.2*  HCT 40.0 40.1 33.5*  MCV 77.7* 79.7* 78.8*  MCH 24.3* 24.3* 24.0*  MCHC 31.3 30.4 30.4  RDW 15.2 15.4 15.3  PLT  438* 378 324   Thyroid  No results for input(s): TSH, FREET4 in the last 168 hours.  BNP Recent Labs  Lab 07/04/24 1949  BNP 175.0*    DDimer No results for input(s): DDIMER in the last 168 hours.   Radiology  CT HEAD WO CONTRAST ( ) Result Date: 07/04/2024 IMPRESSION: 1. No acute intracranial abnormality. 2. 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 3. Similar 10 mm extra-axial mass over the left frontal convexity at the vertex without mass effect or edema likely representing a meningioma. Electronically Signed   By: Norman Gatlin M.D.   On: 07/04/2024 21:08    DG Chest 1 View Result Date: 07/04/2024 IMPRESSION: No active disease. Electronically Signed   By: Norman Gatlin M.D.   On: 07/04/2024 19:07    DG Hip Unilat W or Wo Pelvis 2-3 Views Left Result Date: 07/04/2024 IMPRESSION: Acute comminuted intertrochanteric fracture of the left femur. Electronically Signed   By: Norman Gatlin M.D.   On: 07/04/2024 19:06   Cardiac Studies 06/18/2024 Echo  complete 1. Left ventricular ejection fraction, by estimation, is 60 to 65%. The left ventricle has normal function. The left ventricle has no regional  wall motion abnormalities. Left ventricular diastolic parameters were normal.   2. Right ventricular systolic function is normal. The right ventricular size is normal. There is mildly elevated pulmonary artery systolic pressure. The estimated right ventricular systolic pressure is 43.2 mmHg.   3. The mitral valve is normal in structure. Mild to moderate mitral valve regurgitation. No evidence of mitral stenosis.   4. Tricuspid valve regurgitation is moderate.   5. The aortic valve is tricuspid. Aortic valve regurgitation is not visualized. No aortic stenosis is present.   6. The inferior vena cava is normal in size with greater than 50% respiratory variability, suggesting right atrial pressure of 3 mmHg.    LHC 05/19/2024 Conclusions: Relatively stable appearance of severe two-vessel CAD, including chronic total occlusion of D1 branch and sequential 99% proximal, 40-50% mid, and 70-80% distal RCA stenoses.  There are also moderate, nonobstructive lesions in the ostial/proximal LAD, proximal/mid LCx, and RPDA/RPAV branches. Normal left and right heart filling pressures (mean RA 4, RV 40/5, PCWP 15, LVEDP 15 mmHg).  Prominent V waves noted on PCWP tracing suggest significant mitral regurgitation. Mild pulmonary hypertension (PA 36/15, mean 22 mmHg). Normal Fick cardiac output/index (CO 4.9 L/min, CI 2.7 L/min/m). Successful PCI to proximal and distal RCA stenoses using nonoverlapping Onyx Frontier 3.0 x 15 mm (proximal) and 2.75 x 15 mm (distal) drug-eluting stents with 0% residual stenosis and TIMI-3 flow.   Recommendations: If no evidence of bleeding or vascular injury, resume apixaban  5 mg twice daily this evening.  Anticipate therapy with apixaban , clopidogrel , and aspirin  for 1 week, after which time aspirin  can be stopped.  Continue apixaban   and clopidogrel  therapy for up to 12 months, as tolerated (could switch clopidogrel  to aspirin  at 6 months if there is concern for high bleeding risk). Maintain net even fluid balance. Escalate goal-directed medical therapy as tolerated. Aggressive secondary prevention of coronary artery disease.  Patient Profile   89 y.o. female with a hx of CAD, persistent atrial fibrillation, HFpEF, valvular heart disease, type 2 diabetes, hypertension, hyperlipidemia, and hyperthyroidism, admitted 7/13 after mechanical fall causing L hip fracture, who is being seen for the ongoing management of atrial fibrillation and preoperative risk assessment.   Assessment & Plan   Persistent atrial fibrillation with RVR - Longstanding history of persistent afib with recent  hospitalization 04/2024 during which amiodarone  was discontinued due to concern for QT prolongation and possible amiodarone  toxicity - Found to be in atrial fibrillation with RVR on admission - Started on diltiazem  drip with some improvement in rates. Diltiazem  was discontinued in the setting of CHF history.  - Losartan  was held to allow for titration of rate controlling medications - Continued on metoprolol  tartrate 25 mg every 6 hours - Remains in atrial fibrillation with rates suboptimally controlled at 110-140 bpm - Borderline hypotension limits rate controlling options - Will start digoxin  with IV load 0.25 mg x1 followed by 0.0625 mg oral daily, check dig level tomorrow - Eliquis  is held in anticipation of surgery this afternoon and will be resumed post op in the setting of CHA2DS2VASc at least 7  Fall  Closed L hip fracture Preoperative cardiac risk assessment - Admitted after mechanical fall onto left hip with XR revealing acute comminuted intertrochanteric fracture - Ortho surgery consulted and recommended proceeding with open reduction/internal fixation, tentatively scheduled for this afternoon 7/14 - Patient is at acceptable risk to  proceed with surgery without further cardiovascular testing needing - Given recent DES placement, she will be started on ASA throughout the perioperative period with plan to transition back to dual therapy with Eliquis  and Plavix  post op  Coronary artery disease - Recent NSTEMI 04/2024 with successful PCI/DES to the proximal and distal RCA. Recommendation for triple therapy for 7 days followed by clopidogrel  and Eliquis  for at least 12 months - Denies chest pain. No symptoms of exertional angina or cardiac decompensation.  - EKG without acute ischemic changes - Continue pravastatin  20 mg daily - Eliquis  and clopidogrel  on hold in anticipation of L hip surgery, resume as soon as able - Continue ASA through perioperative period as above  HFimpEF - Echo during recent admission for NSTEMI 04/2024 with EF 40-45%, repeat echo 06/18/2024 showed EF improved to 60-65% - Appears euvolemic and well compensated on exam - Hold losartan  as above, reintroduce as BP allows - Continue PTA spironolactone   Hyperlipidemia - Continue pravastatin   For questions or updates, please contact [facility] Please consult [email or web address] for contact info under     Signed, Lesley LITTIE Maffucci, PA-C  07/06/2024, 10:52 AM

## 2024-07-06 NOTE — Progress Notes (Signed)
 ORTHOPEDIC PROGRESS NOTE  SUBJECTIVE:     The patient is alert and oriented x 3. The patient has a normal mood and affect. The patient is resting quietly in bed. The patient reports their pain is controlled with medications.  Daughter at bedside.  Updated family on surgery scheduled for today.  She reports she may go out and run some errands during surgery.  OBJECTIVE:  Vitals:   07/06/24 0957 07/06/24 1139  BP: 106/67 112/74  Pulse: (!) 122 (!) 135  Resp:  16  Temp:  97.8 F (36.6 C)  SpO2:  97%    The patient's bilateral lower extremities are neurovascularly intact.  Their toes are pink and warm with brisk capillary refill time.  Dorsalis pedis and posterior tibial pulse are 2+ and symmetric. The patient's calves are soft, nontender, with a negative Homans test bilaterally.  Labs:  Recent Labs    07/04/24 1827 07/05/24 0513 07/06/24 0612  HGB 12.5 12.2 10.2*   Recent Labs    07/05/24 0513 07/06/24 0612  WBC 15.0* 14.3*  RBC 5.03 4.25  HCT 40.1 33.5*  PLT 378 324   Recent Labs    07/05/24 0906 07/06/24 0612  NA 133* 136  K 4.1 4.2  CL 101 101  CO2 22 26  BUN 17 12  CREATININE 0.88 0.78  GLUCOSE 192* 177*  CALCIUM  8.5* 8.6*   Recent Labs    07/05/24 0906  INR 1.4*      ASSESSMENT:  Assessment: 89 year old female nursing home patient with closed left highly comminuted intertrochanteric left hip fracture.    PLAN:  2 OR today for open reduction/internal fixation of her highly comminuted closed left intertrochanteric hip fracture.  Patient and daughter agreeable with proceeding.  Daughter reports she may be running errands but plans to be back by the time surgery is over.  Otherwise, I will contact the family by phone after surgery.  Patient has been cleared for surgery by cardiology.  Will restart her blood thinners 12 hours after surgery for her cardiac condition and DVT prophylaxis.  Consent signed and on chart.  Will not use TXA because of her recent  stent placement.   Cordella Lamar Knack M.D. 07/06/2024 11:42 AM

## 2024-07-07 DIAGNOSIS — I482 Chronic atrial fibrillation, unspecified: Secondary | ICD-10-CM | POA: Diagnosis not present

## 2024-07-07 DIAGNOSIS — E1142 Type 2 diabetes mellitus with diabetic polyneuropathy: Secondary | ICD-10-CM | POA: Diagnosis not present

## 2024-07-07 DIAGNOSIS — S72002A Fracture of unspecified part of neck of left femur, initial encounter for closed fracture: Secondary | ICD-10-CM | POA: Diagnosis not present

## 2024-07-07 DIAGNOSIS — I2510 Atherosclerotic heart disease of native coronary artery without angina pectoris: Secondary | ICD-10-CM | POA: Diagnosis not present

## 2024-07-07 DIAGNOSIS — I4819 Other persistent atrial fibrillation: Secondary | ICD-10-CM | POA: Diagnosis not present

## 2024-07-07 LAB — CBC
HCT: 26.1 % — ABNORMAL LOW (ref 36.0–46.0)
HCT: 25.2 % — ABNORMAL LOW (ref 36.0–46.0)
Hemoglobin: 8 g/dL — ABNORMAL LOW (ref 12.0–15.0)
Hemoglobin: 7.9 g/dL — ABNORMAL LOW (ref 12.0–15.0)
MCH: 24.1 pg — ABNORMAL LOW (ref 26.0–34.0)
MCH: 24.5 pg — ABNORMAL LOW (ref 26.0–34.0)
MCHC: 30.7 g/dL (ref 30.0–36.0)
MCHC: 31.3 g/dL (ref 30.0–36.0)
MCV: 78.6 fL — ABNORMAL LOW (ref 80.0–100.0)
MCV: 78.3 fL — ABNORMAL LOW (ref 80.0–100.0)
Platelets: 284 K/uL (ref 150–400)
Platelets: 273 K/uL (ref 150–400)
RBC: 3.32 MIL/uL — ABNORMAL LOW (ref 3.87–5.11)
RBC: 3.22 MIL/uL — ABNORMAL LOW (ref 3.87–5.11)
RDW: 15.5 % (ref 11.5–15.5)
RDW: 15.6 % — ABNORMAL HIGH (ref 11.5–15.5)
WBC: 18 K/uL — ABNORMAL HIGH (ref 4.0–10.5)
WBC: 17.2 K/uL — ABNORMAL HIGH (ref 4.0–10.5)
nRBC: 0 % (ref 0.0–0.2)
nRBC: 0 % (ref 0.0–0.2)

## 2024-07-07 LAB — GLUCOSE, CAPILLARY
Glucose-Capillary: 222 mg/dL — ABNORMAL HIGH (ref 70–99)
Glucose-Capillary: 223 mg/dL — ABNORMAL HIGH (ref 70–99)
Glucose-Capillary: 216 mg/dL — ABNORMAL HIGH (ref 70–99)
Glucose-Capillary: 176 mg/dL — ABNORMAL HIGH (ref 70–99)

## 2024-07-07 LAB — DIGOXIN LEVEL: Digoxin Level: 1.1 ng/mL (ref 0.8–2.0)

## 2024-07-07 MED ORDER — SENNOSIDES-DOCUSATE SODIUM 8.6-50 MG PO TABS
2.0000 | ORAL_TABLET | Freq: Two times a day (BID) | ORAL | Status: DC
  Administered 2024-07-07 – 2024-07-08 (×3): 2 via ORAL
  Filled 2024-07-07 (×3): qty 2

## 2024-07-07 MED ORDER — ASPIRIN 81 MG PO TBEC
81.0000 mg | DELAYED_RELEASE_TABLET | Freq: Every day | ORAL | Status: DC
  Administered 2024-07-08 – 2024-07-11 (×4): 81 mg via ORAL
  Filled 2024-07-07 (×5): qty 1

## 2024-07-07 MED ORDER — INSULIN GLARGINE-YFGN 100 UNIT/ML ~~LOC~~ SOLN
10.0000 [IU] | Freq: Every day | SUBCUTANEOUS | Status: DC
  Administered 2024-07-07 – 2024-07-14 (×8): 10 [IU] via SUBCUTANEOUS
  Filled 2024-07-07 (×8): qty 0.1

## 2024-07-07 MED ORDER — SPIRONOLACTONE 12.5 MG HALF TABLET
12.5000 mg | ORAL_TABLET | Freq: Every day | ORAL | Status: DC
  Filled 2024-07-07: qty 1

## 2024-07-07 MED ORDER — TORSEMIDE 20 MG PO TABS: 20.0000 mg | ORAL_TABLET | Freq: Every day | ORAL | Status: DC | PRN

## 2024-07-07 MED ORDER — OXYCODONE-ACETAMINOPHEN 5-325 MG PO TABS: 0.5000 | ORAL_TABLET | ORAL | 0 refills | Status: DC | PRN

## 2024-07-07 MED ORDER — LACTULOSE 10 GM/15ML PO SOLN
20.0000 g | Freq: Once | ORAL | Status: AC
  Administered 2024-07-07: 20 g via ORAL
  Filled 2024-07-07: qty 30

## 2024-07-07 MED ORDER — OXYCODONE-ACETAMINOPHEN 5-325 MG PO TABS
0.5000 | ORAL_TABLET | ORAL | Status: DC | PRN
  Administered 2024-07-07 – 2024-07-12 (×10): 1 via ORAL
  Administered 2024-07-12: 0.5 via ORAL
  Administered 2024-07-13 – 2024-07-14 (×3): 1 via ORAL
  Filled 2024-07-07 (×15): qty 1

## 2024-07-07 MED ORDER — CLOPIDOGREL BISULFATE 75 MG PO TABS: 75.0000 mg | ORAL_TABLET | Freq: Every day | ORAL | Status: DC

## 2024-07-07 NOTE — Anesthesia Postprocedure Evaluation (Signed)
 Anesthesia Post Note  Patient: Alison Richardson  Procedure(s) Performed: OPEN REDUCTION INTERNAL FIXATION HIP (Left: Hip)  Patient location during evaluation: PACU Anesthesia Type: General Level of consciousness: awake and alert Pain management: pain level controlled Vital Signs Assessment: post-procedure vital signs reviewed and stable Respiratory status: spontaneous breathing, nonlabored ventilation, respiratory function stable and patient connected to nasal cannula oxygen Cardiovascular status: blood pressure returned to baseline and stable Postop Assessment: no apparent nausea or vomiting Anesthetic complications: no   No notable events documented.   Last Vitals:  Vitals:   07/07/24 0000 07/07/24 0100  BP: 91/60 (!) 89/59  Pulse: (!) 139 (!) 102  Resp: (!) 22 (!) 22  Temp: 37.3 C 36.8 C  SpO2: 94% 97%    Last Pain:  Vitals:   07/07/24 0100  TempSrc: Axillary  PainSc:                  Prentice Murphy

## 2024-07-07 NOTE — Progress Notes (Signed)
 Subjective: 1 Day Post-Op Procedure(s) (LRB): OPEN REDUCTION INTERNAL FIXATION HIP (Left) Patient reports pain as moderate.   Patient is well, and has had no acute complaints or problems Plan is to go Rehab after hospital stay.  She will return back to [facility]. Negative for chest pain and shortness of breath Fever: no Gastrointestinal: Negative for nausea and vomiting  Objective: Vital signs in last 24 hours: Temp:  [96.9 F (36.1 C)-99.1 F (37.3 C)] 98.8 F (37.1 C) (07/15 1106) Pulse Rate:  [93-141] 100 (07/15 1106) Resp:  [13-27] 19 (07/15 1106) BP: (89-124)/(50-83) 93/58 (07/15 1106) SpO2:  [91 %-99 %] 99 % (07/15 0758) FiO2 (%):  [40 %] 40 % (07/14 1929) Weight:  [73.9 kg] 73.9 kg (07/15 0500)  Intake/Output from previous day:  Intake/Output Summary (Last 24 hours) at 07/07/2024 1329 Last data filed at 07/07/2024 1021 Gross per 24 hour  Intake 2119.28 ml  Output 735 ml  Net 1384.28 ml    Intake/Output this shift: Total I/O In: 120 [P.O.:120] Out: -   Labs: Recent Labs    07/04/24 1827 07/05/24 0513 07/06/24 0612 07/06/24 2233 07/07/24 0956  HGB 12.5 12.2 10.2* 9.3* 8.0*   Recent Labs    07/06/24 2233 07/07/24 0956  WBC 23.4* 18.0*  RBC 3.79* 3.32*  HCT 29.7* 26.1*  PLT 309 284   Recent Labs    07/06/24 0612 07/06/24 2233  NA 136 134*  K 4.2 4.5  CL 101 99  CO2 26 22  BUN 12 16  CREATININE 0.78 0.86  GLUCOSE 177* 233*  CALCIUM  8.6* 8.2*   Recent Labs    07/05/24 0906  INR 1.4*     EXAM General - Patient is Alert and Oriented Extremity - Neurovascular intact Sensation intact distally Dorsiflexion/Plantar flexion intact Compartment soft Dressing/Incision - clean, dry, no drainage Motor Function - intact, moving foot and toes well on exam.  Ambulated to the chair with physical therapy.  Past Medical History:  Diagnosis Date   Acute on chronic congestive heart failure (HCC) 10/07/2020   Arrhythmia    Atrial fibrillation and  flutter (HCC) 01/06/2019   Plavix  added to eliquis  10-22 after nstemi       Last Assessment & Plan:    Marked facial hematoma post fall but ct of neck and head reportedly normal. No ha or ataxia now but is using a walker. On eliquis  and plavix      Atrial flutter (HCC) 01/2018   new onset    Basal cell carcinoma of back    Basal cell carcinoma of lip    Cerebral aneurysm    followed by [facility]   CHF (congestive heart failure) (HCC)    DDD (degenerative disc disease), lumbar    superior plate depression, L3 08/18/2014   Diabetes mellitus type II, controlled (HCC)    Diverticulosis    Dysrhythmia    Paroxysmal Supraventricular Tachycardia   Dysthymia    depression   GERD (gastroesophageal reflux disease)    History of falling 04/03/2022   History of meniscal tear    Humeral surgical neck fracture 05/16/2022   Hyperlipidemia    Hypertension    Hypokalemia 12/12/2022   Hypothyroidism    Late onset Alzheimer's disease with behavioral disturbance (HCC)    Leukocytosis 10/07/2020   Mild pulmonary hypertension (HCC)    Moderate asthma without complication 12/12/2022   Multifocal pneumonia 12/09/2022   Overactive bladder    Peripheral vascular disease (HCC)    Puncture wound of forehead 06/11/2023  Seasonal allergic rhinitis    Severe sepsis (HCC) 02/07/2022   Sleep apnea     Assessment/Plan: 1 Day Post-Op Procedure(s) (LRB): OPEN REDUCTION INTERNAL FIXATION HIP (Left) Principal Problem:   Closed left hip fracture (HCC) Active Problems:   Hypothyroidism   Type 2 diabetes mellitus with peripheral neuropathy (HCC)   Hyperlipidemia associated with type 2 diabetes mellitus (HCC)   Leukocytosis   Chronic diastolic CHF (congestive heart failure) (HCC)   Cor pulmonale, chronic (HCC)   Late onset Alzheimer's disease without behavioral disturbance (HCC)   Depression with anxiety   CAD (coronary artery disease)   Chronic atrial fibrillation with RVR (HCC)   Asthma   Fall at home,  initial encounter   Nausea & vomiting   Paroxysmal atrial fibrillation (HCC)   Preop cardiovascular exam   Longstanding persistent atrial fibrillation (HCC)  Estimated body mass index is 26.3 kg/m as calculated from the following:   Height as of this encounter: 5' 6 (1.676 m).   Weight as of this encounter: 73.9 kg. Advance diet Up with therapy  Discharge planning: Follow-up with [facility] orth[facility]ics in 2 weeks for staple removal and x-rays of the left hip.  Dressing changes needed.  Plan to discharge to [facility] for rehab.  DVT Prophylaxis - Aspirin , Foot Pumps, and TED hose Weight-Bearing as tolerated to left leg  Krystal Doyne, PA-C Orthopaedic Surgery 07/07/2024, 1:29 PM

## 2024-07-07 NOTE — Progress Notes (Signed)
 Progress Note   Patient: Alison Richardson FMW:[id number] DOB: 1933-01-02 DOA: 07/04/2024     3 DOS: the patient was seen and examined on 07/07/2024   Brief hospital course: Donnella Morford is a 89 y.o. female with medical history significant of CAD, recent NSTEMI with stent placement, dCHF, chronic cor pulmonale, A fib on Eliquis , HTN, HDL, DM, asthma, PVD, hypothyroidism, dementia, depression with anxiety, GERD, who presents with fall and left hip pain.  She sustained a left hip fracture, seen by orthopedic surgery, status post ORIF 7/14. Patient also had coronary stents placed 2 months ago, he is seen by cardiology to clear for surgery   Principal Problem:   Closed left hip fracture (HCC) Active Problems:   Chronic atrial fibrillation with RVR (HCC)   Fall at home, initial encounter   CAD (coronary artery disease)   Chronic diastolic CHF (congestive heart failure) (HCC)   Cor pulmonale, chronic (HCC)   Type 2 diabetes mellitus with peripheral neuropathy (HCC)   Hyperlipidemia associated with type 2 diabetes mellitus (HCC)   Hypothyroidism   Asthma   Nausea & vomiting   Late onset Alzheimer's disease without behavioral disturbance (HCC)   Depression with anxiety   Leukocytosis   Paroxysmal atrial fibrillation (HCC)   Preop cardiovascular exam   Longstanding persistent atrial fibrillation (HCC)   Assessment and Plan: Closed left hip fracture (HCC) Fall at home, initial encounter  X-ray showed acute comminuted intertrochanteric fracture of the left femur.  She did not have syncope. Cardiology has cleared her for surgery, patient had ORIF 7/14. Doing well today, had an increase leukocytosis, probably from surgery.  Evidence of infection.  Repeat CBC tomorrow.  Acute hypoxemia post op. .  Patient had a significant hypoxemia after surgery did not have short of breath.  Currently on 4 L oxygen.  This is likely due to sedation.  Continue to follow, wean oxygen   Chronic atrial  fibrillation with RVR (HCC):  Followed by cardiology, restarted anticoagulation, also on beta-blocker, digoxin  as well as IV amiodarone .  Continue to follow.   CAD (coronary artery disease):  Patient has stent placement. Continue aspirin , hold Plavix  as well as Eliquis  per cardiology recommendation.     Chronic diastolic CHF and Cor pulmonale, chronic : 2D echo on 06/18/2024 showed EF 60 to 65%.  Patient does not have leg edema or JVD.  No SOB.   Still euvolemic.  Restart diuretics.   Type 2 diabetes mellitus with peripheral neuropathy [facility]): Recent A1c 8.2, poorly controlled.  Patient is taking Ozempic  Continue sliding scale insulin , glucose running high, added long-acting insulin .   Hyperlipidemia associated with type 2 diabetes mellitus (HCC) -Pravastatin    Hypothyroidism - Synthroid    Nausea, vomiting:  Resolved.   Asthma: Stable -Bronchodilators as needed Mucinex    Late onset Alzheimer's disease without behavioral disturbance (HCC): -Donepezil    Depression with anxiety - Venlafaxine    Abnormal findings on CT of head:  CT-head showed  a 11 mm periphery calcified right MCA aneurysm, and 10 mm extra-axial mass over the left frontal convexity at the vertex without mass effect or edema likely representing a meningioma. -F/u with PCP       Subjective:  Patient feels well, no leg pain. She has been requiring 4 L oxygen, but does not have any shortness of breath. She has not had a bowel movement for 4 days, lactulose  given.  Physical Exam: Vitals:   07/07/24 0600 07/07/24 0758 07/07/24 0835 07/07/24 1106  BP:  102/61 102/61 (!) 93/58  Pulse: 95 (!) 104 (!) 104 100  Resp: 17 18  19   Temp:  98.6 F (37 C)  98.8 F (37.1 C)  TempSrc:      SpO2: 98% 99%    Weight:      Height:       General exam: Appears calm and comfortable  Respiratory system: Clear to auscultation. Respiratory effort normal. Cardiovascular system: S1 & S2 heard, RRR. No JVD, murmurs, rubs,  gallops or clicks. No pedal edema. Gastrointestinal system: Abdomen is nondistended, soft and nontender. No organomegaly or masses felt. Normal bowel sounds heard. Central nervous system: Alert and oriented x3. No focal neurological deficits. Extremities: Symmetric 5 x 5 power. Skin: No rashes, lesions or ulcers Psychiatry: Judgement and insight appear normal. Mood & affect appropriate.    Data Reviewed:  Results reviewed.  Family Communication: None  Disposition: Status is: Inpatient Remains inpatient appropriate because: Severity of disease, unsafe discharge.     Time spent: 50 minutes  Author: Murvin Mana, MD 07/07/2024 12:16 PM  For on call review [email or web address].

## 2024-07-07 NOTE — Evaluation (Signed)
 Physical Therapy Evaluation Patient Details Name: Alison Richardson MRN: [id number] DOB: [birth year] Today's Date: 07/07/2024  History of Present Illness  Alison Richardson is a 89 y.o. female with medical history significant of CAD, recent NSTEMI with stent placement, dCHF, chronic cor pulmonale, A fib on Eliquis , HTN, HDL, DM, asthma, PVD, hypothyroidism, dementia, depression with anxiety, GERD, who presents with fall and left hip pain.  She sustained a left hip fracture, s/p L hip ORIF on 07/17/24, WBAT.   Clinical Impression  Pt alert, oriented x4 with cues, but noted for STM deficits and situational awareness deficits. Family in room helped confirm pt is from [facility] independent living, normally ambulatory with rollator. Today the patient required modAx2 to come to sitting EOB. Able to perform a few therapeutic exercises, AAROM. Sit <> stand with RW and modAx2, steadying assistance of RW needed as well. Step pivot to recliner minAx2 with near constant, step by step cues and assistance with RW management.  Overall the patient demonstrated deficits (see PT Problem List) that impede the patient's functional abilities, safety, and mobility and would benefit from skilled PT intervention.          If plan is discharge home, recommend the following: Two people to help with walking and/or transfers;A lot of help with bathing/dressing/bathroom;Assistance with feeding;Assistance with cooking/housework;Assist for transportation;Help with stairs or ramp for entrance   Can travel by private vehicle   No    Equipment Recommendations Other (comment) (TBD)  Recommendations for Other Services       Functional Status Assessment Patient has had a recent decline in their functional status and demonstrates the ability to make significant improvements in function in a reasonable and predictable amount of time.     Precautions / Restrictions Precautions Precautions: Fall Recall of Precautions/Restrictions:  Impaired Restrictions Weight Bearing Restrictions Per Provider Order: Yes LLE Weight Bearing Per Provider Order: Weight bearing as tolerated      Mobility  Bed Mobility Overal bed mobility: Needs Assistance Bed Mobility: Supine to Sit     Supine to sit: Mod assist, +2 for physical assistance          Transfers Overall transfer level: Needs assistance Equipment used: Rolling walker (2 wheels) Transfers: Sit to/from Stand, Bed to chair/wheelchair/BSC Sit to Stand: Mod assist, +2 physical assistance   Step pivot transfers: Min assist, +2 physical assistance            Ambulation/Gait                  Stairs            Wheelchair Mobility     Tilt Bed    Modified Rankin (Stroke Patients Only)       Balance Overall balance assessment: Needs assistance Sitting-balance support: Feet supported, Single extremity supported Sitting balance-Leahy Scale: Fair     Standing balance support: Bilateral upper extremity supported Standing balance-Leahy Scale: Poor                               Pertinent Vitals/Pain Pain Assessment Pain Assessment: 0-10 Pain Score: 10-Worst pain ever Pain Location: L hip Pain Descriptors / Indicators: Discomfort, Grimacing, Guarding Pain Intervention(s): Limited activity within patient's tolerance, Monitored during session, Repositioned    Home Living Family/patient expects to be discharged to:: Assisted living                 Home Equipment: Grab bars - tub/shower;Grab bars - toilet;Cane -  single point;Shower seat;Rollator (4 wheels) Additional Comments: [facility] living    Prior Function Prior Level of Function : Independent/Modified Independent             Mobility Comments: ambulates with use of a rollator ADLs Comments: Patient reports she is I to mod I for ADLs     Extremity/Trunk Assessment   Upper Extremity Assessment Upper Extremity Assessment:  Overall WFL for tasks assessed    Lower Extremity Assessment Lower Extremity Assessment: Generalized weakness (unable to move LLE against gravity without assistance)       Communication   Communication Communication: No apparent difficulties    Cognition Arousal: Alert Behavior During Therapy: WFL for tasks assessed/performed   PT - Cognitive impairments: No apparent impairments                         Following commands: Impaired Following commands impaired: Follows one step commands with increased time     Cueing Cueing Techniques: Verbal cues, Tactile cues, Gestural cues, Visual cues     General Comments General comments (skin integrity, edema, etc.): Max HR 134 bpm with activity.    Exercises     Assessment/Plan    PT Assessment Patient needs continued PT services  PT Problem List Decreased strength;Pain;Decreased range of motion;Decreased activity tolerance;Decreased knowledge of use of DME;Decreased balance;Decreased mobility;Decreased knowledge of precautions       PT Treatment Interventions DME instruction;Balance training;Gait training;Neuromuscular re-education;Stair training;Functional mobility training;Patient/family education;Therapeutic activities;Therapeutic exercise    PT Goals (Current goals can be found in the Care Plan section)  Acute Rehab PT Goals Patient Stated Goal: to go home PT Goal Formulation: With patient Time For Goal Achievement: 07/21/24 Potential to Achieve Goals: Fair    Frequency 7X/week     Co-evaluation PT/OT/SLP Co-Evaluation/Treatment: Yes Reason for Co-Treatment: To address functional/ADL transfers;For patient/therapist safety PT goals addressed during session: Mobility/safety with mobility;Balance;Proper use of DME OT goals addressed during session: ADL's and self-care;Proper use of Adaptive equipment and DME       AM-PAC PT 6 Clicks Mobility  Outcome Measure Help needed turning from your back to your  side while in a flat bed without using bedrails?: A Lot Help needed moving from lying on your back to sitting on the side of a flat bed without using bedrails?: A Lot Help needed moving to and from a bed to a chair (including a wheelchair)?: A Lot Help needed standing up from a chair using your arms (e.g., wheelchair or bedside chair)?: A Lot Help needed to walk in hospital room?: Total Help needed climbing 3-5 steps with a railing? : Total 6 Click Score: 10    End of Session   Activity Tolerance: Patient limited by pain Patient left: in chair;with call bell/phone within reach;with chair alarm set Nurse Communication: Mobility status PT Visit Diagnosis: Other abnormalities of gait and mobility (R26.89);Difficulty in walking, not elsewhere classified (R26.2);Muscle weakness (generalized) (M62.81);Pain Pain - Right/Left: Left Pain - part of body: Hip    Time: 9040-8977 PT Time Calculation (min) (ACUTE ONLY): 23 min   Charges:   PT Evaluation $PT Eval Low Complexity: 1 Low PT Treatments $Therapeutic Activity: 8-22 mins PT General Charges $$ ACUTE PT VISIT: 1 Visit        Doyal Shams PT, DPT 1:24 PM,07/07/24

## 2024-07-07 NOTE — Progress Notes (Signed)
   07/06/24 1929  Assess: MEWS Score  Temp 97.6 F (36.4 C)  BP 94/64  MAP (mmHg) 75  Pulse Rate (!) 132  ECG Heart Rate (!) 132  Resp 16  Level of Consciousness Alert  SpO2 95 %  O2 Device Nasal Cannula  Patient Activity (if Appropriate) In bed  O2 Flow Rate (L/min) 4 L/min  FiO2 (%) 40 %  Assess: MEWS Score  MEWS Temp 0  MEWS Systolic 1  MEWS Pulse 3  MEWS RR 0  MEWS LOC 0  MEWS Score 4  MEWS Score Color Red  Assess: if the MEWS score is Yellow or Red  Were vital signs accurate and taken at a resting state? Yes  Does the patient meet 2 or more of the SIRS criteria? No  MEWS guidelines implemented  Yes, red  Treat  MEWS Interventions Considered administering scheduled or prn medications/treatments as ordered  Take Vital Signs  Increase Vital Sign Frequency  Red: Q1hr x2, continue Q4hrs until patient remains green for 12hrs  Escalate  MEWS: Escalate Red: Discuss with charge nurse and notify provider. Consider notifying RRT. If remains red for 2 hours consider need for higher level of care  Notify: Charge Nurse/RN  Name of Charge Nurse/RN Notified Dickey, RN  Provider Notification  Provider Name/Title Dr. Lawence  Date Provider Notified 07/06/24  Time Provider Notified 1935  Method of Notification  (Secure chat)  Notification Reason Other (Comment) (Red MEWS)  Provider response See new orders  Date of Provider Response 07/06/24  Time of Provider Response 1938  Notify: Rapid Response  Name of Rapid Response RN Notified Izetta Garfield, RN  Date Rapid Response Notified 07/06/24  Time Rapid Response Notified 2200  Assess: SIRS CRITERIA  SIRS Temperature  0  SIRS Respirations  0  SIRS Pulse 1  SIRS WBC 0  SIRS Score Sum  1

## 2024-07-07 NOTE — Plan of Care (Addendum)
 The patient remains on AR-2A as of time of writing. The patient's MEWS flips between Red and yellow all night due to HR, BP (see my prior note). At approximately 19030 hours, the patient's HR and BP triggers a mews warning and the protocol is initiated; Charge nurse Dickey and Dr. Lawence notified by this RN, and new orders were received. Over the next 2-3 hours the patient showed no signs of improvement, and Dr. Lawence was notified again; again new orders received and initiated. At this time the rapid response nurse / ICU charge nurse, Izetta Garfield, RN, was notified of the patient's condition as well. Again, over the next 1-1.5 hours, there were no significant improvements in the patient's HR, so again Dr. Lawence was notified and again new orders were received; this time initiation of Amiodarone  bolus. The patient's most recent QTC was noted to be 577 ms, so this RN confirmed the order with the MD prior to initiation of the drip. The patient is AC/HS CBG checks with SSI. Foley catheter remains in place. The patient is now requiring 4 L / min of supplemental O2 via [place]. The patient's MRSA PCR was sent down overnight and resulted as negative. CHG bath completed overnight.  Edit: 0500 hours: Increased agitation and paranoia again this morning. Patient refusing PO meds this AM, retimed per patient preference. This RN attempted to call both of the patient's listed contacts, Ranika Mcniel and Norleen Pouch, this AM but both numbers went to VM.   Problem: Education: Goal: Ability to describe self-care measures that may prevent or decrease complications (Diabetes Survival Skills Education) will improve Outcome: Not Progressing Goal: Individualized Educational Video(s) Outcome: Not Progressing   Problem: Coping: Goal: Ability to adjust to condition or change in health will improve Outcome: Not Progressing   Problem: Fluid Volume: Goal: Ability to maintain a balanced intake and output will improve Outcome: Not  Progressing   Problem: Health Behavior/Discharge Planning: Goal: Ability to identify and utilize available resources and services will improve Outcome: Not Progressing Goal: Ability to manage health-related needs will improve Outcome: Not Progressing   Problem: Metabolic: Goal: Ability to maintain appropriate glucose levels will improve Outcome: Not Progressing   Problem: Nutritional: Goal: Maintenance of adequate nutrition will improve Outcome: Not Progressing Goal: Progress toward achieving an optimal weight will improve Outcome: Not Progressing   Problem: Skin Integrity: Goal: Risk for impaired skin integrity will decrease Outcome: Not Progressing   Problem: Tissue Perfusion: Goal: Adequacy of tissue perfusion will improve Outcome: Not Progressing   Problem: Education: Goal: Knowledge of General Education information will improve Description: Including pain rating scale, medication(s)/side effects and non-pharmacologic comfort measures Outcome: Not Progressing   Problem: Health Behavior/Discharge Planning: Goal: Ability to manage health-related needs will improve Outcome: Not Progressing   Problem: Clinical Measurements: Goal: Ability to maintain clinical measurements within normal limits will improve Outcome: Not Progressing Goal: Will remain free from infection Outcome: Not Progressing Goal: Diagnostic test results will improve Outcome: Not Progressing Goal: Respiratory complications will improve Outcome: Not Progressing Goal: Cardiovascular complication will be avoided Outcome: Not Progressing   Problem: Activity: Goal: Risk for activity intolerance will decrease Outcome: Not Progressing   Problem: Nutrition: Goal: Adequate nutrition will be maintained Outcome: Not Progressing   Problem: Coping: Goal: Level of anxiety will decrease Outcome: Not Progressing   Problem: Elimination: Goal: Will not experience complications related to bowel  motility Outcome: Not Progressing Goal: Will not experience complications related to urinary retention Outcome: Not Progressing   Problem: Pain  Managment: Goal: General experience of comfort will improve and/or be controlled Outcome: Not Progressing   Problem: Safety: Goal: Ability to remain free from injury will improve Outcome: Not Progressing   Problem: Skin Integrity: Goal: Risk for impaired skin integrity will decrease Outcome: Not Progressing   Problem: Education: Goal: Ability to demonstrate management of disease process will improve Outcome: Not Progressing Goal: Ability to verbalize understanding of medication therapies will improve Outcome: Not Progressing Goal: Individualized Educational Video(s) Outcome: Not Progressing   Problem: Activity: Goal: Capacity to carry out activities will improve Outcome: Not Progressing   Problem: Cardiac: Goal: Ability to achieve and maintain adequate cardiopulmonary perfusion will improve Outcome: Not Progressing

## 2024-07-07 NOTE — Evaluation (Signed)
 Occupational Therapy Evaluation Patient Details Name: Alison Richardson MRN: [id number] DOB: June 05, 1933 Today's Date: 07/07/2024   History of Present Illness   Alison Richardson is a 89 y.o. female with medical history significant of CAD, recent NSTEMI with stent placement, dCHF, chronic cor pulmonale, A fib on Eliquis , HTN, HDL, DM, asthma, PVD, hypothyroidism, dementia, depression with anxiety, GERD, who presents with fall and left hip pain.  She sustained a left hip fracture, s/p L hip ORIF on 07/17/24, WBAT   Clinical Impressions Alison Richardson was seen for OT evaluation this date. Prior to hospital admission, pt was MOD I using 4WW. Pt lives at [facility] [place] [facility]. Pt currently requires MAX A don B socks sitting EOB. MOD A exit bed, fair sitting balance. MOD A x2 + RW sit<>stand, improves to MIN A x2 + RW for bed>chair step pivot t/f. With time and cues answers orientation questions correctly however repeatedly forgets t/o session requiring reminders. Pt would benefit from skilled OT to address noted impairments and functional limitations (see below for any additional details). Upon hospital discharge, recommend OT follow up <3 hours/day.    If plan is discharge home, recommend the following:   Two people to help with walking and/or transfers;Two people to help with bathing/dressing/bathroom     Functional Status Assessment   Patient has had a recent decline in their functional status and demonstrates the ability to make significant improvements in function in a reasonable and predictable amount of time.     Equipment Recommendations   BSC/3in1     Recommendations for Other Services         Precautions/Restrictions   Precautions Precautions: Fall Recall of Precautions/Restrictions: Impaired Restrictions Weight Bearing Restrictions Per Provider Order: Yes LLE Weight Bearing Per Provider Order: Weight bearing as tolerated     Mobility Bed Mobility Overal bed mobility:  Needs Assistance Bed Mobility: Supine to Sit     Supine to sit: Mod assist, +2 for safety          Transfers Overall transfer level: Needs assistance Equipment used: Rolling walker (2 wheels) Transfers: Sit to/from Stand, Bed to chair/wheelchair/BSC Sit to Stand: Mod assist, +2 physical assistance     Step pivot transfers: Min assist, +2 physical assistance            Balance Overall balance assessment: Needs assistance Sitting-balance support: Feet supported, Single extremity supported Sitting balance-Leahy Scale: Fair     Standing balance support: Bilateral upper extremity supported Standing balance-Leahy Scale: Poor                             ADL either performed or assessed with clinical judgement   ADL Overall ADL's : Needs assistance/impaired                                       General ADL Comments: MAX A don B socks sitting EOB. MIN A x2 + RW for simulated BSC t/f.     Vision         Perception         Praxis         Pertinent Vitals/Pain Pain Assessment Pain Assessment: 0-10 Pain Score: 10-Worst pain ever Pain Location: L hip Pain Descriptors / Indicators: Discomfort, Grimacing, Guarding Pain Intervention(s): Limited activity within patient's tolerance, Repositioned     Extremity/Trunk Assessment Upper Extremity Assessment Upper  Extremity Assessment: Overall WFL for tasks assessed   Lower Extremity Assessment Lower Extremity Assessment: Generalized weakness       Communication Communication Communication: No apparent difficulties   Cognition Arousal: Alert Behavior During Therapy: WFL for tasks assessed/performed Cognition: Cognition impaired             OT - Cognition Comments: A&O x4 with time/cues however repeatedly stating granddaughter in room is daughter.                 Following commands: Impaired Following commands impaired: Follows one step commands with increased time      Cueing  General Comments   Cueing Techniques: Verbal cues;Tactile cues  Max HR 134 bpm with activity.   Exercises     Shoulder Instructions      Home Living Family/patient expects to be discharged to:: Assisted living                             Home Equipment: Grab bars - tub/shower;Grab bars - toilet;Cane - single point;Shower seat;Rollator (4 wheels)   Additional Comments: [facility] living      Prior Functioning/Environment Prior Level of Function : Independent/Modified Independent             Mobility Comments: ambulates with use of a rollator      OT Problem List: Decreased strength;Decreased range of motion;Decreased activity tolerance;Impaired balance (sitting and/or standing);Decreased safety awareness   OT Treatment/Interventions: Self-care/ADL training;Therapeutic exercise;Energy conservation;DME and/or AE instruction;Therapeutic activities;Patient/family education;Balance training      OT Goals(Current goals can be found in the care plan section)   Acute Rehab OT Goals Patient Stated Goal: to go home OT Goal Formulation: With patient/family Time For Goal Achievement: 07/21/24 Potential to Achieve Goals: Good ADL Goals Pt Will Perform Grooming: with min assist;standing Pt Will Perform Lower Body Dressing: with min assist;sit to/from stand Pt Will Transfer to Toilet: with min assist;ambulating;regular height toilet   OT Frequency:  Min 2X/week    Co-evaluation              AM-PAC OT 6 Clicks Daily Activity     Outcome Measure Help from another person eating meals?: None Help from another person taking care of personal grooming?: A Little Help from another person toileting, which includes using toliet, bedpan, or urinal?: A Lot Help from another person bathing (including washing, rinsing, drying)?: A Lot Help from another person to put on and taking off regular upper body clothing?: A Little Help  from another person to put on and taking off regular lower body clothing?: A Lot 6 Click Score: 16   End of Session Equipment Utilized During Treatment: Rolling walker (2 wheels) Nurse Communication: Mobility status  Activity Tolerance: Patient tolerated treatment well Patient left: in chair;with call bell/phone within reach;with chair alarm set;with family/visitor present  OT Visit Diagnosis: Other abnormalities of gait and mobility (R26.89);Muscle weakness (generalized) (M62.81)                Time: 9044-8977 OT Time Calculation (min): 27 min Charges:  OT General Charges $OT Visit: 1 Visit OT Evaluation $OT Eval Moderate Complexity: 1 Mod OT Treatments $Self Care/Home Management : 8-22 mins  Alison Richardson, M.S. OTR/L  07/07/24, 11:36 AM  ascom [phone]

## 2024-07-07 NOTE — Progress Notes (Addendum)
 Rounding Note   Patient Name: Alison Richardson Date of Encounter: 07/07/2024  [facility] Cardiologist: Evalene Lunger, MD  Subjective Patient has significant confusion at time of exam. She denies chest pain and shortness of breath. She remains in atrial fibrillation with rates improved somewhat this morning on amiodarone  infusion.   Scheduled Meds:  apixaban   5 mg Oral BID   aspirin  EC  81 mg Oral Daily   Chlorhexidine  Gluconate Cloth  6 each Topical Daily   digoxin   0.0625 mg Oral Daily   donepezil   10 mg Oral QHS   famotidine   20 mg Oral BID AC   feeding supplement (GLUCERNA SHAKE)  237 mL Oral TID BM   gabapentin   100 mg Oral QHS   insulin  aspart  0-5 Units Subcutaneous QHS   insulin  aspart  0-9 Units Subcutaneous TID WC   levothyroxine   88 mcg Oral Q0600   lidocaine   1 patch Transdermal Q24H   metoprolol  tartrate  25 mg Oral Q6H   multivitamin with minerals  1 tablet Oral Daily   pravastatin   20 mg Oral QHS   senna-docusate  2 tablet Oral BID   venlafaxine  XR  75 mg Oral Daily   Continuous Infusions:  sodium chloride  Stopped (07/07/24 0513)   amiodarone  30 mg/hr (07/07/24 0600)   PRN Meds: sodium chloride , acetaminophen , albuterol , dextromethorphan -guaiFENesin , hydrALAZINE , methocarbamol , metoprolol  tartrate, morphine  injection, nitroGLYCERIN , ondansetron  (ZOFRAN ) IV, oxyCODONE -acetaminophen , torsemide    Vital Signs  Vitals:   07/07/24 0500 07/07/24 0600 07/07/24 0758 07/07/24 0835  BP: 110/61  102/61 102/61  Pulse: 99 95 (!) 104 (!) 104  Resp: 20 17 18    Temp: 97.9 F (36.6 C)  98.6 F (37 C)   TempSrc: Axillary     SpO2: 97% 98% 99%   Weight: 73.9 kg     Height:        Intake/Output Summary (Last 24 hours) at 07/07/2024 1031 Last data filed at 07/07/2024 1021 Gross per 24 hour  Intake 2419.28 ml  Output 735 ml  Net 1684.28 ml      07/07/2024    5:00 AM 07/06/2024   12:00 AM 07/04/2024    6:23 PM  Last 3 Weights  Weight (lbs) 162 lb 14.7 oz  157 lb 10.1 oz 160 lb  Weight (kg) 73.9 kg 71.5 kg 72.576 kg      Telemetry Atrial fibrillation rate 80-150 bpm - Personally Reviewed  Physical Exam  GEN: No acute distress.   Neck: No JVD Cardiac: IRIR, tachycardic, no murmurs, rubs, or gallops.  Respiratory: Clear to auscultation anteriorly. GI: Soft, nontender, non-distended  MS: No edema Neuro:  Nonfocal  Psych: Normal affect   Labs High Sensitivity Troponin:  No results for input(s): TROPONINIHS in the last 720 hours.   Chemistry Recent Labs  Lab 07/04/24 1827 07/05/24 0906 07/06/24 0612 07/06/24 2233  NA 136 133* 136 134*  K 3.8 4.1 4.2 4.5  CL 103 101 101 99  CO2 19* 22 26 22   GLUCOSE 229* 192* 177* 233*  BUN 18 17 12 16   CREATININE 0.84 0.88 0.78 0.86  CALCIUM  8.9 8.5* 8.6* 8.2*  MG  --   --  2.1 1.9  PROT 7.4  --   --   --   ALBUMIN 4.1  --   --   --   AST 32  --   --   --   ALT 26  --   --   --   ALKPHOS 78  --   --   --  BILITOT 0.6  --   --   --   GFRNONAA >60 >60 >60 >60  ANIONGAP 14 10 9 13     Lipids No results for input(s): CHOL, TRIG, HDL, LABVLDL, LDLCALC, CHOLHDL in the last 168 hours.  Hematology Recent Labs  Lab 07/06/24 0612 07/06/24 2233 07/07/24 0956  WBC 14.3* 23.4* 18.0*  RBC 4.25 3.79* 3.32*  HGB 10.2* 9.3* 8.0*  HCT 33.5* 29.7* 26.1*  MCV 78.8* 78.4* 78.6*  MCH 24.0* 24.5* 24.1*  MCHC 30.4 31.3 30.7  RDW 15.3 15.5 15.5  PLT 324 309 284   Thyroid  No results for input(s): TSH, FREET4 in the last 168 hours.  BNP Recent Labs  Lab 07/04/24 1949  BNP 175.0*    DDimer No results for input(s): DDIMER in the last 168 hours.   Radiology  CT HEAD WO CONTRAST ( ) Result Date: 07/04/2024 IMPRESSION: 1. No acute intracranial abnormality. 2. 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 3. Similar 10 mm extra-axial mass over the left frontal convexity at the vertex without mass effect or edema likely representing a meningioma. Electronically Signed    By: Norman Gatlin M.D.   On: 07/04/2024 21:08    DG Chest 1 View Result Date: 07/04/2024 IMPRESSION: No active disease. Electronically Signed   By: Norman Gatlin M.D.   On: 07/04/2024 19:07    DG Hip Unilat W or Wo Pelvis 2-3 Views Left Result Date: 07/04/2024 IMPRESSION: Acute comminuted intertrochanteric fracture of the left femur. Electronically Signed   By: Norman Gatlin M.D.   On: 07/04/2024 19:06   Cardiac Studies 06/18/2024 Echo complete 1. Left ventricular ejection fraction, by estimation, is 60 to 65%. The left ventricle has normal function. The left ventricle has no regional  wall motion abnormalities. Left ventricular diastolic parameters were normal.   2. Right ventricular systolic function is normal. The right ventricular size is normal. There is mildly elevated pulmonary artery systolic pressure. The estimated right ventricular systolic pressure is 43.2 mmHg.   3. The mitral valve is normal in structure. Mild to moderate mitral valve regurgitation. No evidence of mitral stenosis.   4. Tricuspid valve regurgitation is moderate.   5. The aortic valve is tricuspid. Aortic valve regurgitation is not visualized. No aortic stenosis is present.   6. The inferior vena cava is normal in size with greater than 50% respiratory variability, suggesting right atrial pressure of 3 mmHg.    LHC 05/19/2024 Conclusions: Relatively stable appearance of severe two-vessel CAD, including chronic total occlusion of D1 branch and sequential 99% proximal, 40-50% mid, and 70-80% distal RCA stenoses.  There are also moderate, nonobstructive lesions in the ostial/proximal LAD, proximal/mid LCx, and RPDA/RPAV branches. Normal left and right heart filling pressures (mean RA 4, RV 40/5, PCWP 15, LVEDP 15 mmHg).  Prominent V waves noted on PCWP tracing suggest significant mitral regurgitation. Mild pulmonary hypertension (PA 36/15, mean 22 mmHg). Normal Fick cardiac output/index (CO 4.9 L/min, CI 2.7  L/min/m). Successful PCI to proximal and distal RCA stenoses using nonoverlapping Onyx Frontier 3.0 x 15 mm (proximal) and 2.75 x 15 mm (distal) drug-eluting stents with 0% residual stenosis and TIMI-3 flow.   Recommendations: If no evidence of bleeding or vascular injury, resume apixaban  5 mg twice daily this evening.  Anticipate therapy with apixaban , clopidogrel , and aspirin  for 1 week, after which time aspirin  can be stopped.  Continue apixaban  and clopidogrel  therapy for up to 12 months, as tolerated (could switch clopidogrel  to aspirin  at 6 months if  there is concern for high bleeding risk). Maintain net even fluid balance. Escalate goal-directed medical therapy as tolerated. Aggressive secondary prevention of coronary artery disease.  Patient Profile   89 y.o. female with a hx of CAD, persistent atrial fibrillation, HFpEF, valvular heart disease, type 2 diabetes, hypertension, hyperlipidemia, and hyperthyroidism, admitted 7/13 after mechanical fall causing L hip fracture, who is being seen for the ongoing management of atrial fibrillation and preoperative risk assessment.   Assessment & Plan   Persistent atrial fibrillation with RVR - Longstanding history of persistent afib with recent hospitalization 04/2024 during which amiodarone  was discontinued due to concern for QT prolongation and possible amiodarone  toxicity - Found to be in atrial fibrillation with RVR on admission - Started on diltiazem  drip with some improvement in rates. Diltiazem  was discontinued in the setting of CHF history.  - Losartan  was held to allow for titration of rate controlling medications - Continued on metoprolol  tartrate 25 mg every 6 hours - Borderline hypotension limits rate controlling options - Rates elevated overnight prompting medicine team to initiate IV amiodarone  bolus and infusion with additional loading dose of digoxin  - Will need to monitor Qtc closely given history of prolongation with amiodarone ,  repeat EKG ordered - Remains in atrial fibrillation with rates somewhat improved, 80-150 bpm - Eliquis  5 mg twice daily resumed today   Fall  Closed L hip fracture Preoperative cardiac risk assessment - Admitted after mechanical fall onto left hip with XR revealing acute comminuted intertrochanteric fracture - Eliquis  and clopidogrel  were held prior to surgery and patient was started on ASA during the perioperative period. Eliquis  has since been resumed and she will transition from ASA to clopidogrel  tomorrow.  - S/p surgery yesterday afternoon  Coronary artery disease - Recent NSTEMI 04/2024 with successful PCI/DES to the proximal and distal RCA. Recommendation for triple therapy for 7 days followed by clopidogrel  and Eliquis  for at least 12 months - Denies chest pain. No symptoms of exertional angina or cardiac decompensation.  - EKG without acute ischemic changes - Continue pravastatin  20 mg daily - Received ASA this morning, will resume clopidogrel  tomorrow  HFimpEF - Echo during recent admission for NSTEMI 04/2024 with EF 40-45%, repeat echo 06/18/2024 showed EF improved to 60-65% - Appears euvolemic and well compensated on exam - Hold losartan  as above, reintroduce as BP allows - Continue PTA spironolactone   Hyperlipidemia - Continue pravastatin   For questions or updates, please contact [facility] Please consult [email or web address] for contact info under     Signed, Lesley LITTIE Maffucci, PA-C  07/07/2024, 10:31 AM

## 2024-07-08 ENCOUNTER — Encounter: Payer: Self-pay | Admitting: Orthopedic Surgery

## 2024-07-08 DIAGNOSIS — W19XXXA Unspecified fall, initial encounter: Secondary | ICD-10-CM

## 2024-07-08 DIAGNOSIS — I5032 Chronic diastolic (congestive) heart failure: Secondary | ICD-10-CM | POA: Diagnosis not present

## 2024-07-08 DIAGNOSIS — I25118 Atherosclerotic heart disease of native coronary artery with other forms of angina pectoris: Secondary | ICD-10-CM

## 2024-07-08 DIAGNOSIS — D649 Anemia, unspecified: Secondary | ICD-10-CM | POA: Diagnosis not present

## 2024-07-08 DIAGNOSIS — S72002A Fracture of unspecified part of neck of left femur, initial encounter for closed fracture: Secondary | ICD-10-CM | POA: Diagnosis not present

## 2024-07-08 DIAGNOSIS — I4819 Other persistent atrial fibrillation: Secondary | ICD-10-CM | POA: Diagnosis not present

## 2024-07-08 LAB — IRON AND TIBC
Iron: 8 ug/dL — ABNORMAL LOW (ref 28–170)
Saturation Ratios: 3 % — ABNORMAL LOW (ref 10.4–31.8)
TIBC: 311 ug/dL (ref 250–450)
UIBC: 303 ug/dL

## 2024-07-08 LAB — BASIC METABOLIC PANEL WITH GFR
Anion gap: 9 (ref 5–15)
BUN: 14 mg/dL (ref 8–23)
CO2: 24 mmol/L (ref 22–32)
Calcium: 8.4 mg/dL — ABNORMAL LOW (ref 8.9–10.3)
Chloride: 103 mmol/L (ref 98–111)
Creatinine, Ser: 0.58 mg/dL (ref 0.44–1.00)
GFR, Estimated: >60 mL/min (ref >60)
Glucose, Bld: 236 mg/dL — ABNORMAL HIGH (ref 70–99)
Potassium: 3.9 mmol/L (ref 3.5–5.1)
Sodium: 136 mmol/L (ref 135–145)

## 2024-07-08 LAB — CBC
HCT: 24.2 % — ABNORMAL LOW (ref 36.0–46.0)
Hemoglobin: 7.5 g/dL — ABNORMAL LOW (ref 12.0–15.0)
MCH: 24.6 pg — ABNORMAL LOW (ref 26.0–34.0)
MCHC: 31 g/dL (ref 30.0–36.0)
MCV: 79.3 fL — ABNORMAL LOW (ref 80.0–100.0)
Platelets: 275 K/uL (ref 150–400)
RBC: 3.05 MIL/uL — ABNORMAL LOW (ref 3.87–5.11)
RDW: 15.7 % — ABNORMAL HIGH (ref 11.5–15.5)
WBC: 12.4 K/uL — ABNORMAL HIGH (ref 4.0–10.5)
nRBC: 0 % (ref 0.0–0.2)

## 2024-07-08 LAB — MAGNESIUM: Magnesium: 2.2 mg/dL (ref 1.7–2.4)

## 2024-07-08 LAB — VITAMIN B12: Vitamin B-12: 210 pg/mL (ref 180–914)

## 2024-07-08 LAB — GLUCOSE, CAPILLARY
Glucose-Capillary: 197 mg/dL — ABNORMAL HIGH (ref 70–99)
Glucose-Capillary: 265 mg/dL — ABNORMAL HIGH (ref 70–99)
Glucose-Capillary: 165 mg/dL — ABNORMAL HIGH (ref 70–99)
Glucose-Capillary: 248 mg/dL — ABNORMAL HIGH (ref 70–99)

## 2024-07-08 LAB — HEMOGLOBIN AND HEMATOCRIT, BLOOD
HCT: 26.1 % — ABNORMAL LOW (ref 36.0–46.0)
Hemoglobin: 7.8 g/dL — ABNORMAL LOW (ref 12.0–15.0)

## 2024-07-08 LAB — VITAMIN D 25 HYDROXY (VIT D DEFICIENCY, FRACTURES): Vit D, 25-Hydroxy: 24.19 ng/mL — ABNORMAL LOW (ref 30–100)

## 2024-07-08 LAB — PHOSPHORUS: Phosphorus: 2 mg/dL — ABNORMAL LOW (ref 2.5–4.6)

## 2024-07-08 MED ORDER — POTASSIUM PHOSPHATES 15 MMOLE/5ML IV SOLN
30.0000 mmol | Freq: Once | INTRAVENOUS | Status: AC
  Administered 2024-07-08: 30 mmol via INTRAVENOUS
  Filled 2024-07-08: qty 10

## 2024-07-08 MED ORDER — BISACODYL 10 MG RE SUPP: 10.0000 mg | Freq: Every day | RECTAL | Status: DC | PRN

## 2024-07-08 MED ORDER — POLYETHYLENE GLYCOL 3350 17 G PO PACK
17.0000 g | PACK | Freq: Two times a day (BID) | ORAL | Status: DC
  Administered 2024-07-09 – 2024-07-13 (×7): 17 g via ORAL
  Filled 2024-07-08 (×10): qty 1

## 2024-07-08 MED ORDER — BISACODYL 10 MG RE SUPP
10.0000 mg | Freq: Once | RECTAL | Status: AC
  Administered 2024-07-08: 10 mg via RECTAL
  Filled 2024-07-08: qty 1

## 2024-07-08 MED ORDER — BISACODYL 5 MG PO TBEC: 10.0000 mg | DELAYED_RELEASE_TABLET | Freq: Every day | ORAL | Status: DC

## 2024-07-08 MED ORDER — VITAMIN D (ERGOCALCIFEROL) 1.25 MG (50000 UNIT) PO CAPS
50000.0000 [IU] | ORAL_CAPSULE | ORAL | Status: DC
  Administered 2024-07-08: 50000 [IU] via ORAL
  Filled 2024-07-08: qty 1

## 2024-07-08 MED ORDER — VITAMIN B-12 1000 MCG PO TABS: 1000.0000 ug | ORAL_TABLET | Freq: Every day | ORAL | Status: DC

## 2024-07-08 MED ORDER — MIDODRINE HCL 5 MG PO TABS
5.0000 mg | ORAL_TABLET | Freq: Three times a day (TID) | ORAL | Status: DC
  Administered 2024-07-08: 5 mg via ORAL
  Filled 2024-07-08: qty 1

## 2024-07-08 MED ORDER — VITAMIN C 500 MG PO TABS
500.0000 mg | ORAL_TABLET | Freq: Every day | ORAL | Status: DC
  Administered 2024-07-09 – 2024-07-14 (×6): 500 mg via ORAL
  Filled 2024-07-08 (×6): qty 1

## 2024-07-08 MED ORDER — POLYETHYLENE GLYCOL 3350 17 G PO PACK
17.0000 g | PACK | Freq: Once | ORAL | Status: AC
  Administered 2024-07-08: 17 g via ORAL
  Filled 2024-07-08: qty 1

## 2024-07-08 MED ORDER — IRON SUCROSE 300 MG IVPB - SIMPLE MED
300.0000 mg | Freq: Once | Status: AC
  Administered 2024-07-08: 300 mg via INTRAVENOUS
  Filled 2024-07-08: qty 300

## 2024-07-08 MED ORDER — POLYSACCHARIDE IRON COMPLEX 150 MG PO CAPS
150.0000 mg | ORAL_CAPSULE | Freq: Every day | ORAL | Status: DC
  Filled 2024-07-08: qty 1

## 2024-07-08 MED ORDER — CYANOCOBALAMIN 1000 MCG/ML IJ SOLN
1000.0000 ug | Freq: Every day | INTRAMUSCULAR | Status: DC
  Administered 2024-07-08 – 2024-07-13 (×6): 1000 ug via INTRAMUSCULAR
  Filled 2024-07-08 (×6): qty 1

## 2024-07-08 MED ORDER — MIDODRINE HCL 5 MG PO TABS
10.0000 mg | ORAL_TABLET | Freq: Three times a day (TID) | ORAL | Status: DC
  Administered 2024-07-08 – 2024-07-10 (×5): 10 mg via ORAL
  Filled 2024-07-08 (×6): qty 2

## 2024-07-08 NOTE — Progress Notes (Signed)
 Physical Therapy Treatment Patient Details Name: Alison Richardson MRN: [id number] DOB: 09/17/1933 Today's Date: 07/08/2024   History of Present Illness Alison Richardson is a 89 y.o. female with medical history significant of CAD, recent NSTEMI with stent placement, dCHF, chronic cor pulmonale, A fib on Eliquis , HTN, HDL, DM, asthma, PVD, hypothyroidism, dementia, depression with anxiety, GERD, who presents with fall and left hip pain.  She sustained a left hip fracture, s/p L hip ORIF on 07/17/24, WBAT    PT Comments  Patient alert, looking for assistance to transfer to [facility] upon PT entrance. Displayed mild-moderate pain signs/symptoms with mobility, referenced LLE pain. Pt oriented to self, place with context clues, and some of situation. She required min-modAx2 with RW for sit <> Stand step pivot and BSC use. maxA for pericare. Able to perform a few seated therapeutic exercises at end of session as well. The patient would benefit from further skilled PT intervention to continue to progress towards goals.      If plan is discharge home, recommend the following: Two people to help with walking and/or transfers;A lot of help with bathing/dressing/bathroom;Assistance with feeding;Assistance with cooking/housework;Assist for transportation;Help with stairs or ramp for entrance   Can travel by private vehicle     No  Equipment Recommendations  Other (comment) (TBD)    Recommendations for Other Services       Precautions / Restrictions Precautions Precautions: Fall Recall of Precautions/Restrictions: Impaired Restrictions Weight Bearing Restrictions Per Provider Order: Yes LLE Weight Bearing Per Provider Order: Weight bearing as tolerated     Mobility  Bed Mobility               General bed mobility comments: not tested    Transfers Overall transfer level: Needs assistance Equipment used: Rolling walker (2 wheels) Transfers: Sit to/from Stand, Bed to chair/wheelchair/BSC Sit to  Stand: Mod assist, +2 safety/equipment   Step pivot transfers: Min assist, +2 physical assistance            Ambulation/Gait                   Stairs             Wheelchair Mobility     Tilt Bed    Modified Rankin (Stroke Patients Only)       Balance Overall balance assessment: Needs assistance Sitting-balance support: Feet supported, Single extremity supported Sitting balance-Leahy Scale: Fair     Standing balance support: Bilateral upper extremity supported Standing balance-Leahy Scale: Poor                              Communication Communication Communication: Impaired Factors Affecting Communication: Hearing impaired  Cognition   Behavior During Therapy: WFL for tasks assessed/performed                             Following commands: Impaired Following commands impaired: Follows one step commands with increased time    Cueing Cueing Techniques: Verbal cues, Tactile cues, Gestural cues, Visual cues  Exercises Other Exercises Other Exercises: seated heel raises, LAQ and seated marching x10 BLE with rest breaks as needed    General Comments        Pertinent Vitals/Pain Pain Assessment Pain Assessment: Faces Faces Pain Scale: Hurts little more Pain Location: L hip Pain Descriptors / Indicators: Discomfort, Grimacing, Guarding    Home Living  Prior Function            PT Goals (current goals can now be found in the care plan section) Progress towards PT goals: Progressing toward goals    Frequency    7X/week      PT Plan      Co-evaluation PT/OT/SLP Co-Evaluation/Treatment: Yes Reason for Co-Treatment: To address functional/ADL transfers;For patient/therapist safety PT goals addressed during session: Mobility/safety with mobility;Balance;Proper use of DME OT goals addressed during session: ADL's and self-care;Proper use of Adaptive equipment and DME       AM-PAC PT 6 Clicks Mobility   Outcome Measure  Help needed turning from your back to your side while in a flat bed without using bedrails?: A Lot Help needed moving from lying on your back to sitting on the side of a flat bed without using bedrails?: A Lot Help needed moving to and from a bed to a chair (including a wheelchair)?: A Lot Help needed standing up from a chair using your arms (e.g., wheelchair or bedside chair)?: A Lot Help needed to walk in hospital room?: Total Help needed climbing 3-5 steps with a railing? : Total 6 Click Score: 10    End of Session Equipment Utilized During Treatment: Gait belt Activity Tolerance: Patient limited by pain Patient left: in chair;with call bell/phone within reach;with chair alarm set Nurse Communication: Mobility status PT Visit Diagnosis: Other abnormalities of gait and mobility (R26.89);Difficulty in walking, not elsewhere classified (R26.2);Muscle weakness (generalized) (M62.81);Pain Pain - Right/Left: Left Pain - part of body: Hip     Time: 1030-1057 PT Time Calculation (min) (ACUTE ONLY): 27 min  Charges:    $Therapeutic Activity: 8-22 mins PT General Charges $$ ACUTE PT VISIT: 1 Visit                    Doyal Shams PT, DPT 12:41 PM,07/08/24

## 2024-07-08 NOTE — Progress Notes (Signed)
 Progress Note   Patient: Alison Richardson FMW:[id number] DOB: 1933-10-03 DOA: 07/04/2024     4 DOS: the patient was seen and examined on 07/08/2024   Brief hospital course: Alison Richardson is a 89 y.o. female with medical history significant of CAD, recent NSTEMI with stent placement, dCHF, chronic cor pulmonale, A fib on Eliquis , HTN, HDL, DM, asthma, PVD, hypothyroidism, dementia, depression with anxiety, GERD, who presents with fall and left hip pain.  She sustained a left hip fracture, seen by orthopedic surgery, status post ORIF 7/14. Patient also had coronary stents placed 2 months ago, he is seen by cardiology to clear for surgery   Principal Problem:   Closed left hip fracture (HCC) Active Problems:   Chronic atrial fibrillation with RVR (HCC)   Fall at home, initial encounter   CAD (coronary artery disease)   Chronic diastolic CHF (congestive heart failure) (HCC)   Cor pulmonale, chronic (HCC)   Type 2 diabetes mellitus with peripheral neuropathy (HCC)   Hyperlipidemia associated with type 2 diabetes mellitus (HCC)   Hypothyroidism   Asthma   Nausea & vomiting   Late onset Alzheimer's disease without behavioral disturbance (HCC)   Depression with anxiety   Leukocytosis   Paroxysmal atrial fibrillation (HCC)   Preop cardiovascular exam   Longstanding persistent atrial fibrillation (HCC)   Fall   Assessment and Plan: Closed left hip fracture (HCC) Fall at home, initial encounter  X-ray showed acute comminuted intertrochanteric fracture of the left femur.  She did not have syncope. Cardiology has cleared her for surgery, patient had ORIF 7/14. Doing well today, had an increase leukocytosis, probably from surgery.  Evidence of infection.   Repeat CBC tomorrow.  Acute hypoxemia post op. .  Patient had a significant hypoxemia after surgery did not have short of breath.  Currently on 4 L oxygen.  This is likely due to sedation.  Continue to follow, wean oxygen   Chronic  atrial fibrillation with RVR (HCC):  Followed by cardiology, restarted anticoagulation, also on beta-blocker, digoxin  as well as IV amiodarone .  Continue to follow.   CAD (coronary artery disease):  Patient has stent placement. Continue aspirin , hold Plavix  as well as Eliquis  per cardiology recommendation.     Chronic diastolic CHF and Cor pulmonale, chronic : 2D echo on 06/18/2024 showed EF 60 to 65%.  Patient does not have leg edema or JVD.  No SOB.   Still euvolemic.  Restart diuretics.    Hypophosphatemia Phos repleted. Monitor electrolytes and replete as needed  Iron  deficiency, Tsat 3%, Venofer  3 mg IV one-time dose given on 7/16, start oral supplement with vitamin C  Follow-up with PCP to repeat iron  profile after 3 to 79-month  Vitamin D  deficiency: started vitamin D  50,000 units p.o. weekly, follow with PCP to repeat vitamin D  level after 3 to 6 months.  Vitamin B12 level 210, goal >400: Started vitamin B12 1000 mcg IM injection daily during hospital stay, followed by oral supplement.  Follow-up PCP to repeat vitamin B12 level after 3 to 6 months.    Type 2 diabetes mellitus with peripheral neuropathy [facility]): Recent A1c 8.2, poorly controlled.  Patient is taking Ozempic  Continue sliding scale insulin , glucose running high, added long-acting insulin .   Hyperlipidemia associated with type 2 diabetes mellitus (HCC) -Pravastatin    Hypothyroidism - Synthroid    Nausea, vomiting:  Resolved.   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 -F/u with PCP  Constipation -MiraLAX  twice daily -Dulcolax 10 mg p.o.  nightly from 7/17 - Dulcolax suppository one-time dose on 7/16      Subjective:  No significant events overnight, patient did work with physical therapy and she was very exhausted and short of breath.  Denied any complaints.  Physical Exam: Vitals:   07/08/24 1400 07/08/24 1600 07/08/24 1653 07/08/24 1707  BP: 113/64 (!) 90/56 (!) 91/58 105/61  Pulse: 97 99 96 98  Resp: (!) 27 20  13   Temp:      TempSrc:      SpO2: 96% 96%  99%  Weight:      Height:       General exam: Appears calm and comfortable  Respiratory system: Clear to auscultation. Respiratory effort normal. Cardiovascular system: S1 & S2 heard, RRR. No JVD, murmurs, rubs, gallops or clicks. No pedal edema. Gastrointestinal system: Abdomen is nondistended, soft and nontender. No organomegaly or masses felt. Normal bowel sounds heard. Central nervous system: Alert and oriented x3. No focal neurological deficits. Extremities: Symmetric 5 x 5 power. Skin: No rashes, lesions or ulcers Psychiatry: Judgement and insight appear normal. Mood & affect appropriate.    Data Reviewed:  Results reviewed.  Family Communication: None  Disposition: Status is: Inpatient Remains inpatient appropriate because: Severity of disease, unsafe discharge.     Time spent: 55 minutes  Author: Elvan Sor, MD 07/08/2024 5:26 PM  For on call review [email or web address].

## 2024-07-08 NOTE — Progress Notes (Signed)
 Subjective: 2 Days Post-Op Procedure(s) (LRB): OPEN REDUCTION INTERNAL FIXATION HIP (Left) Patient reports pain as mild to moderate.   Patient is well, and has had no acute complaints or problems Plan is to go Rehab after hospital stay.  She will return back to [place]. Negative for chest pain and shortness of breath Fever: no Gastrointestinal: Negative for nausea and vomiting  Objective: Vital signs in last 24 hours: Temp:  [97.5 F (36.4 C)-98.8 F (37.1 C)] 98.1 F (36.7 C) (07/16 0410) Pulse Rate:  [99-107] 102 (07/16 0410) Resp:  [13-19] 19 (07/16 0410) BP: (93-125)/(58-67) 125/67 (07/16 0410) SpO2:  [97 %-100 %] 97 % (07/16 0410)  Intake/Output from previous day:  Intake/Output Summary (Last 24 hours) at 07/08/2024 0714 Last data filed at 07/08/2024 0639 Gross per 24 hour  Intake 558.83 ml  Output 1700 ml  Net -1141.17 ml    Intake/Output this shift: No intake/output data recorded.  Labs: Recent Labs    07/06/24 0612 07/06/24 2233 07/07/24 0956 07/07/24 1625 07/08/24 0459  HGB 10.2* 9.3* 8.0* 7.9* 7.5*   Recent Labs    07/07/24 1625 07/08/24 0459  WBC 17.2* 12.4*  RBC 3.22* 3.05*  HCT 25.2* 24.2*  PLT 273 275   Recent Labs    07/06/24 0612 07/06/24 2233  NA 136 134*  K 4.2 4.5  CL 101 99  CO2 26 22  BUN 12 16  CREATININE 0.78 0.86  GLUCOSE 177* 233*  CALCIUM  8.6* 8.2*   Recent Labs    07/05/24 0906  INR 1.4*     EXAM General - Patient is Alert and Oriented Extremity - Neurovascular intact Sensation intact distally Dorsiflexion/Plantar flexion intact Compartment soft Dressing/Incision - clean, dry, no drainage.  Bulky dressing. Motor Function - intact, moving foot and toes well on exam.  Ambulated to the chair with physical therapy.  Past Medical History:  Diagnosis Date   Acute on chronic congestive heart failure (HCC) 10/07/2020   Arrhythmia    Atrial fibrillation and flutter (HCC) 01/06/2019   Plavix  added to eliquis   10-22 after nstemi       Last Assessment & Plan:    Marked facial hematoma post fall but ct of neck and head reportedly normal. No ha or ataxia now but is using a walker. On eliquis  and plavix      Atrial flutter (HCC) 01/2018   new onset    Basal cell carcinoma of back    Basal cell carcinoma of lip    Cerebral aneurysm    followed by [facility]   CHF (congestive heart failure) (HCC)    DDD (degenerative disc disease), lumbar    superior plate depression, L3 08/18/2014   Diabetes mellitus type II, controlled (HCC)    Diverticulosis    Dysrhythmia    Paroxysmal Supraventricular Tachycardia   Dysthymia    depression   GERD (gastroesophageal reflux disease)    History of falling 04/03/2022   History of meniscal tear    Humeral surgical neck fracture 05/16/2022   Hyperlipidemia    Hypertension    Hypokalemia 12/12/2022   Hypothyroidism    Late onset Alzheimer's disease with behavioral disturbance (HCC)    Leukocytosis 10/07/2020   Mild pulmonary hypertension (HCC)    Moderate asthma without complication 12/12/2022   Multifocal pneumonia 12/09/2022   Overactive bladder    Peripheral vascular disease (HCC)    Puncture wound of forehead 06/11/2023   Seasonal allergic rhinitis    Severe sepsis (HCC) 02/07/2022   Sleep apnea  Assessment/Plan: 2 Days Post-Op Procedure(s) (LRB): OPEN REDUCTION INTERNAL FIXATION HIP (Left) Principal Problem:   Closed left hip fracture (HCC) Active Problems:   Hypothyroidism   Type 2 diabetes mellitus with peripheral neuropathy (HCC)   Hyperlipidemia associated with type 2 diabetes mellitus (HCC)   Leukocytosis   Chronic diastolic CHF (congestive heart failure) (HCC)   Cor pulmonale, chronic (HCC)   Late onset Alzheimer's disease without behavioral disturbance (HCC)   Depression with anxiety   CAD (coronary artery disease)   Chronic atrial fibrillation with RVR (HCC)   Asthma   Fall at home, initial encounter   Nausea & vomiting   Paroxysmal  atrial fibrillation (HCC)   Preop cardiovascular exam   Longstanding persistent atrial fibrillation (HCC)  Estimated body mass index is 26.3 kg/m as calculated from the following:   Height as of this encounter: 5' 6 (1.676 m).   Weight as of this encounter: 73.9 kg. Advance diet Up with therapy  Acute blood loss anemia status post hip fracture.  Hemoglobin 7.5.  Need to follow-up.  Transfusion decision with hospitalist.  Discharge planning: Follow-up with [facility] clinic orthopedics in 2 weeks for staple removal and x-rays of the left hip.  Dressing changes as needed.  Plan to discharge to [facility] for rehab.  DVT Prophylaxis - Aspirin , Foot Pumps, and TED hose Weight-Bearing as tolerated to left leg  Krystal Doyne, PA-C Orthopaedic Surgery 07/08/2024, 7:14 AM

## 2024-07-08 NOTE — Plan of Care (Signed)

## 2024-07-08 NOTE — Progress Notes (Signed)
 Rounding Note   Patient Name: Alison Richardson Date of Encounter: 07/08/2024  [facility] Cardiologist: Evalene Lunger, MD  Subjective Mental status seems to be improved today with less confusion. She denies chest pain and shortness of breath. She reports feeling weak. She remains in atrial fibrillation with rates reasonably well controlled.   Scheduled Meds:  aspirin  EC  81 mg Oral Daily   Chlorhexidine  Gluconate Cloth  6 each Topical Daily   digoxin   0.0625 mg Oral Daily   donepezil   10 mg Oral QHS   famotidine   20 mg Oral BID AC   feeding supplement (GLUCERNA SHAKE)  237 mL Oral TID BM   gabapentin   100 mg Oral QHS   insulin  aspart  0-5 Units Subcutaneous QHS   insulin  aspart  0-9 Units Subcutaneous TID WC   insulin  glargine-yfgn  10 Units Subcutaneous Daily   levothyroxine   88 mcg Oral Q0600   lidocaine   1 patch Transdermal Q24H   metoprolol  tartrate  25 mg Oral Q6H   midodrine   5 mg Oral TID WC   multivitamin with minerals  1 tablet Oral Daily   pravastatin   20 mg Oral QHS   senna-docusate  2 tablet Oral BID   venlafaxine  XR  75 mg Oral Daily   Continuous Infusions:  amiodarone  30 mg/hr (07/08/24 0118)   PRN Meds: acetaminophen , albuterol , dextromethorphan -guaiFENesin , hydrALAZINE , methocarbamol , metoprolol  tartrate, morphine  injection, nitroGLYCERIN , ondansetron  (ZOFRAN ) IV, oxyCODONE -acetaminophen , torsemide    Vital Signs  Vitals:   07/08/24 0848 07/08/24 0923 07/08/24 0929 07/08/24 0931  BP:    (!) 100/56  Pulse:   95 95  Resp:      Temp:      TempSrc:      SpO2: 93% (!) 88%    Weight:      Height:        Intake/Output Summary (Last 24 hours) at 07/08/2024 1049 Last data filed at 07/08/2024 1031 Gross per 24 hour  Intake 698.83 ml  Output 1700 ml  Net -1001.17 ml      07/07/2024    5:00 AM 07/06/2024   12:00 AM 07/04/2024    6:23 PM  Last 3 Weights  Weight (lbs) 162 lb 14.7 oz 157 lb 10.1 oz 160 lb  Weight (kg) 73.9 kg 71.5 kg 72.576 kg       Telemetry Atrial fibrillation rate 90-120 bpm - Personally Reviewed  Physical Exam  GEN: No acute distress.   Neck: No JVD Cardiac: IRIR, tachycardic, no murmurs, rubs, or gallops.  Respiratory: Clear to auscultation anteriorly. GI: Soft, nontender, non-distended  MS: No edema Neuro:  Nonfocal  Psych: Normal affect   Labs High Sensitivity Troponin:  No results for input(s): TROPONINIHS in the last 720 hours.   Chemistry Recent Labs  Lab 07/04/24 1827 07/05/24 0906 07/06/24 0612 07/06/24 2233  NA 136 133* 136 134*  K 3.8 4.1 4.2 4.5  CL 103 101 101 99  CO2 19* 22 26 22   GLUCOSE 229* 192* 177* 233*  BUN 18 17 12 16   CREATININE 0.84 0.88 0.78 0.86  CALCIUM  8.9 8.5* 8.6* 8.2*  MG  --   --  2.1 1.9  PROT 7.4  --   --   --   ALBUMIN 4.1  --   --   --   AST 32  --   --   --   ALT 26  --   --   --   ALKPHOS 78  --   --   --  BILITOT 0.6  --   --   --   GFRNONAA >60 >60 >60 >60  ANIONGAP 14 10 9 13     Lipids No results for input(s): CHOL, TRIG, HDL, LABVLDL, LDLCALC, CHOLHDL in the last 168 hours.  Hematology Recent Labs  Lab 07/07/24 0956 07/07/24 1625 07/08/24 0459  WBC 18.0* 17.2* 12.4*  RBC 3.32* 3.22* 3.05*  HGB 8.0* 7.9* 7.5*  HCT 26.1* 25.2* 24.2*  MCV 78.6* 78.3* 79.3*  MCH 24.1* 24.5* 24.6*  MCHC 30.7 31.3 31.0  RDW 15.5 15.6* 15.7*  PLT 284 273 275   Thyroid  No results for input(s): TSH, FREET4 in the last 168 hours.  BNP Recent Labs  Lab 07/04/24 1949  BNP 175.0*    DDimer No results for input(s): DDIMER in the last 168 hours.   Radiology  CT HEAD WO CONTRAST ( ) Result Date: 07/04/2024 IMPRESSION: 1. No acute intracranial abnormality. 2. 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 3. Similar 10 mm extra-axial mass over the left frontal convexity at the vertex without mass effect or edema likely representing a meningioma. Electronically Signed   By: Norman Gatlin M.D.   On: 07/04/2024 21:08    DG Chest 1  View Result Date: 07/04/2024 IMPRESSION: No active disease. Electronically Signed   By: Norman Gatlin M.D.   On: 07/04/2024 19:07    DG Hip Unilat W or Wo Pelvis 2-3 Views Left Result Date: 07/04/2024 IMPRESSION: Acute comminuted intertrochanteric fracture of the left femur. Electronically Signed   By: Norman Gatlin M.D.   On: 07/04/2024 19:06   Cardiac Studies 06/18/2024 Echo complete 1. Left ventricular ejection fraction, by estimation, is 60 to 65%. The left ventricle has normal function. The left ventricle has no regional  wall motion abnormalities. Left ventricular diastolic parameters were normal.   2. Right ventricular systolic function is normal. The right ventricular size is normal. There is mildly elevated pulmonary artery systolic pressure. The estimated right ventricular systolic pressure is 43.2 mmHg.   3. The mitral valve is normal in structure. Mild to moderate mitral valve regurgitation. No evidence of mitral stenosis.   4. Tricuspid valve regurgitation is moderate.   5. The aortic valve is tricuspid. Aortic valve regurgitation is not visualized. No aortic stenosis is present.   6. The inferior vena cava is normal in size with greater than 50% respiratory variability, suggesting right atrial pressure of 3 mmHg.    LHC 05/19/2024 Conclusions: Relatively stable appearance of severe two-vessel CAD, including chronic total occlusion of D1 branch and sequential 99% proximal, 40-50% mid, and 70-80% distal RCA stenoses.  There are also moderate, nonobstructive lesions in the ostial/proximal LAD, proximal/mid LCx, and RPDA/RPAV branches. Normal left and right heart filling pressures (mean RA 4, RV 40/5, PCWP 15, LVEDP 15 mmHg).  Prominent V waves noted on PCWP tracing suggest significant mitral regurgitation. Mild pulmonary hypertension (PA 36/15, mean 22 mmHg). Normal Fick cardiac output/index (CO 4.9 L/min, CI 2.7 L/min/m). Successful PCI to proximal and distal RCA stenoses using  nonoverlapping Onyx Frontier 3.0 x 15 mm (proximal) and 2.75 x 15 mm (distal) drug-eluting stents with 0% residual stenosis and TIMI-3 flow.   Recommendations: If no evidence of bleeding or vascular injury, resume apixaban  5 mg twice daily this evening.  Anticipate therapy with apixaban , clopidogrel , and aspirin  for 1 week, after which time aspirin  can be stopped.  Continue apixaban  and clopidogrel  therapy for up to 12 months, as tolerated (could switch clopidogrel  to aspirin  at 6 months if  there is concern for high bleeding risk). Maintain net even fluid balance. Escalate goal-directed medical therapy as tolerated. Aggressive secondary prevention of coronary artery disease.  Patient Profile   89 y.o. female with a hx of CAD, persistent atrial fibrillation, HFpEF, valvular heart disease, type 2 diabetes, hypertension, hyperlipidemia, and hyperthyroidism, admitted 7/13 after mechanical fall causing L hip fracture, who is being seen for the ongoing management of atrial fibrillation and preoperative risk assessment.   Assessment & Plan   Persistent atrial fibrillation with RVR - Longstanding history of persistent afib with recent hospitalization 04/2024 during which amiodarone  was discontinued due to concern for QT prolongation and possible amiodarone  toxicity - Found to be in atrial fibrillation with RVR on admission - Started on diltiazem  drip with some improvement in rates. Diltiazem  was discontinued in the setting of CHF history.  - Antihypertensives were held to allow for titration of rate controlling medications - Continued on metoprolol  tartrate 25 mg every 6 hours - Borderline hypotension limits rate controlling options - Remains in atrial fibrillation with rates improving 90-120 bpm today - Will start midodrine  5 mg three times daily for BP support with ongoing rate control - Continue IV amiodarone  for now, consider discontinuing tomorrow if BP allows further titration of metoprolol  -  Will need to monitor Qtc closely given history of prolongation with amiodarone , daily EKG ordered - Continue digoxin  0.0625 mg daily, check dig level later this week - Eliquis  held due to worsening anemia, consider resuming tomorrow is Hgb improved. May require transfusion.   Fall  Closed L hip fracture Preoperative cardiac risk assessment - Admitted after mechanical fall onto left hip with XR revealing acute comminuted intertrochanteric fracture - Eliquis  and clopidogrel  were held prior to surgery and patient was started on ASA during the perioperative period - S/p surgery 7/14  - Continued on ASA for now with plan to transition back to Eliquis  and clopidogrel  once hgb is stable  Coronary artery disease - Recent NSTEMI 04/2024 with successful PCI/DES to the proximal and distal RCA. Recommendation for triple therapy for 7 days followed by clopidogrel  and Eliquis  for at least 12 months - Denies chest pain. No symptoms of exertional angina or cardiac decompensation.  - EKG without acute ischemic changes - Continue pravastatin  20 mg daily - Continued on ASA for now with plan to transition to clopidogrel  once hgb stable  HFimpEF - Echo during recent admission for NSTEMI 04/2024 with EF 40-45%, repeat echo 06/18/2024 showed EF improved to 60-65% - Appears euvolemic and well compensated on exam - Hold losartan  as above, reintroduce as BP allows - Continue PTA spironolactone   Hyperlipidemia - Continue pravastatin   For questions or updates, please contact [facility] Please consult [email or web address] for contact info under     Signed, Lesley LITTIE Maffucci, PA-C  07/08/2024, 10:49 AM

## 2024-07-08 NOTE — Progress Notes (Signed)
 Occupational Therapy Treatment Patient Details Name: Alison Richardson MRN: [id number] DOB: 1933/06/07 Today's Date: 07/08/2024   History of present illness Alison Richardson is a 89 y.o. female with medical history significant of CAD, recent NSTEMI with stent placement, dCHF, chronic cor pulmonale, A fib on Eliquis , HTN, HDL, DM, asthma, PVD, hypothyroidism, dementia, depression with anxiety, GERD, who presents with fall and left hip pain.  She sustained a left hip fracture, s/p L hip ORIF on 07/17/24, [facility]   OT comments  Alison Richardson was seen for OT treatment on this date. Upon arrival to room pt seated in chair, agreeable to tx. Improved orientation however intermittently confused t/o session requiring time/cues to reorient. Pt requires MIN A x2 + RW for chair>BSC t/f, decreasing to MOD A x2 BSC>chair t/f as pt fatigues. MAX A pericare standing. SPO2 96% on RA, left on RA with RN aware. Pt making good progress toward goals, will continue to follow POC. Discharge recommendation remains appropriate.       If plan is discharge home, recommend the following:  Two people to help with walking and/or transfers;Two people to help with bathing/dressing/bathroom   Equipment Recommendations  BSC/3in1    Recommendations for Other Services      Precautions / Restrictions Precautions Precautions: Fall Recall of Precautions/Restrictions: Impaired Restrictions Weight Bearing Restrictions Per Provider Order: Yes LLE Weight Bearing Per Provider Order: Weight bearing as tolerated       Mobility Bed Mobility               General bed mobility comments: not tested    Transfers Overall transfer level: Needs assistance Equipment used: Rolling walker (2 wheels) Transfers: Sit to/from Stand, Bed to chair/wheelchair/BSC Sit to Stand: Mod assist, +2 safety/equipment     Step pivot transfers: Min assist, +2 physical assistance           Balance Overall balance assessment: Needs  assistance Sitting-balance support: Feet supported, Single extremity supported Sitting balance-Leahy Scale: Fair     Standing balance support: Bilateral upper extremity supported Standing balance-Leahy Scale: Poor                             ADL either performed or assessed with clinical judgement   ADL Overall ADL's : Needs assistance/impaired                                       General ADL Comments: MIN A x2 + RW for BSC t/f, MAX A pericare standing.     Communication Communication Communication: Impaired Factors Affecting Communication: Hearing impaired   Cognition Arousal: Alert Behavior During Therapy: WFL for tasks assessed/performed Cognition: Cognition impaired   Orientation impairments: Time                           Following commands: Impaired Following commands impaired: Follows one step commands with increased time      Cueing   Cueing Techniques: Verbal cues, Tactile cues, Gestural cues, Visual cues  Exercises      Shoulder Instructions       General Comments      Pertinent Vitals/ Pain       Pain Assessment Pain Assessment: Faces Faces Pain Scale: Hurts little more Pain Location: L hip Pain Descriptors / Indicators: Discomfort, Grimacing, Guarding Pain Intervention(s): Limited activity within patient's tolerance,  Repositioned  Home Living                                          Prior Functioning/Environment              Frequency  Min 2X/week        Progress Toward Goals  OT Goals(current goals can now be found in the care plan section)  Progress towards OT goals: Progressing toward goals  Acute Rehab OT Goals OT Goal Formulation: With patient/family Time For Goal Achievement: 07/21/24 Potential to Achieve Goals: Good ADL Goals Pt Will Perform Grooming: with min assist;standing Pt Will Perform Lower Body Dressing: with min assist;sit to/from stand Pt Will  Transfer to Toilet: with min assist;ambulating;regular height toilet  Plan      Co-evaluation    PT/OT/SLP Co-Evaluation/Treatment: Yes Reason for Co-Treatment: To address functional/ADL transfers;For patient/therapist safety PT goals addressed during session: Mobility/safety with mobility;Balance;Proper use of DME OT goals addressed during session: ADL's and self-care;Proper use of Adaptive equipment and DME      AM-PAC OT 6 Clicks Daily Activity     Outcome Measure   Help from another person eating meals?: None Help from another person taking care of personal grooming?: A Little Help from another person toileting, which includes using toliet, bedpan, or urinal?: A Lot Help from another person bathing (including washing, rinsing, drying)?: A Lot Help from another person to put on and taking off regular upper body clothing?: A Little Help from another person to put on and taking off regular lower body clothing?: A Lot 6 Click Score: 16    End of Session Equipment Utilized During Treatment: Rolling walker (2 wheels)  OT Visit Diagnosis: Other abnormalities of gait and mobility (R26.89);Muscle weakness (generalized) (M62.81)   Activity Tolerance Patient tolerated treatment well   Patient Left in chair;with call bell/phone within reach (PT in room)   Nurse Communication Mobility status        Time: 1030-1051 OT Time Calculation (min): 21 min  Charges: OT General Charges $OT Visit: 1 Visit OT Treatments $Self Care/Home Management : 8-22 mins  Elston Slot, M.S. OTR/L  07/08/24, 11:13 AM  ascom [phone]

## 2024-07-08 NOTE — TOC Progression Note (Signed)
 Transition of Care [facility]) - Progression Note    Patient Details  Name: Alison Richardson MRN: [id number] Date of Birth: 1933/09/24  Transition of Care [facility]) CM/SW Contact  Tomasa JAYSON Childes, RN Phone Number: 07/08/2024, 4:16 PM  Clinical Narrative:    Patient from [facility].          Expected Discharge Plan and Services                                               Social Determinants of Health (SDOH) Interventions SDOH Screenings   Food Insecurity: No Food Insecurity (07/05/2024)  Housing: Low Risk  (07/05/2024)  Transportation Needs: No Transportation Needs (07/05/2024)  Utilities: Not At Risk (07/05/2024)  Alcohol Screen: Low Risk  (04/09/2023)  Depression (PHQ2-9): Low Risk  (05/05/2024)  Financial Resource Strain: Low Risk  (05/20/2024)  Physical Activity: Insufficiently Active (04/09/2023)  Social Connections: Socially Isolated (07/05/2024)  Stress: No Stress Concern Present (04/09/2023)  Tobacco Use: Low Risk  (07/06/2024)    Readmission Risk Interventions    05/20/2024   11:59 AM 03/29/2024    4:52 PM 01/26/2022    2:47 PM  Readmission Risk Prevention Plan  Transportation Screening Complete Complete Complete  PCP or Specialist Appt within 5-7 Days  Complete   PCP or Specialist Appt within 3-5 Days Complete  Complete  Home Care Screening  Complete   Medication Review (RN CM)  Complete   HRI or Home Care Consult Complete  Complete  Social Work Consult for Recovery Care Planning/Counseling Complete  Complete  Palliative Care Screening Not Applicable  Not Applicable  Medication Review Oceanographer) Complete  Complete

## 2024-07-09 DIAGNOSIS — I959 Hypotension, unspecified: Secondary | ICD-10-CM

## 2024-07-09 DIAGNOSIS — S72002A Fracture of unspecified part of neck of left femur, initial encounter for closed fracture: Secondary | ICD-10-CM | POA: Diagnosis not present

## 2024-07-09 DIAGNOSIS — I25118 Atherosclerotic heart disease of native coronary artery with other forms of angina pectoris: Secondary | ICD-10-CM | POA: Diagnosis not present

## 2024-07-09 DIAGNOSIS — I5032 Chronic diastolic (congestive) heart failure: Secondary | ICD-10-CM | POA: Diagnosis not present

## 2024-07-09 DIAGNOSIS — I4819 Other persistent atrial fibrillation: Secondary | ICD-10-CM | POA: Diagnosis not present

## 2024-07-09 DIAGNOSIS — D649 Anemia, unspecified: Secondary | ICD-10-CM | POA: Diagnosis not present

## 2024-07-09 LAB — CBC
HCT: 24.1 % — ABNORMAL LOW (ref 36.0–46.0)
Hemoglobin: 7.5 g/dL — ABNORMAL LOW (ref 12.0–15.0)
MCH: 24.2 pg — ABNORMAL LOW (ref 26.0–34.0)
MCHC: 31.1 g/dL (ref 30.0–36.0)
MCV: 77.7 fL — ABNORMAL LOW (ref 80.0–100.0)
Platelets: 319 K/uL (ref 150–400)
RBC: 3.1 MIL/uL — ABNORMAL LOW (ref 3.87–5.11)
RDW: 15.8 % — ABNORMAL HIGH (ref 11.5–15.5)
WBC: 13.1 K/uL — ABNORMAL HIGH (ref 4.0–10.5)
nRBC: 0.5 % — ABNORMAL HIGH (ref 0.0–0.2)

## 2024-07-09 LAB — BASIC METABOLIC PANEL WITH GFR
Anion gap: 7 (ref 5–15)
BUN: 9 mg/dL (ref 8–23)
CO2: 26 mmol/L (ref 22–32)
Calcium: 8.2 mg/dL — ABNORMAL LOW (ref 8.9–10.3)
Chloride: 104 mmol/L (ref 98–111)
Creatinine, Ser: 0.58 mg/dL (ref 0.44–1.00)
GFR, Estimated: >60 mL/min (ref >60)
Glucose, Bld: 151 mg/dL — ABNORMAL HIGH (ref 70–99)
Potassium: 4.1 mmol/L (ref 3.5–5.1)
Sodium: 137 mmol/L (ref 135–145)

## 2024-07-09 LAB — HEMOGLOBIN AND HEMATOCRIT, BLOOD
HCT: 28 % — ABNORMAL LOW (ref 36.0–46.0)
Hemoglobin: 8.7 g/dL — ABNORMAL LOW (ref 12.0–15.0)

## 2024-07-09 LAB — GLUCOSE, CAPILLARY
Glucose-Capillary: 208 mg/dL — ABNORMAL HIGH (ref 70–99)
Glucose-Capillary: 186 mg/dL — ABNORMAL HIGH (ref 70–99)
Glucose-Capillary: 163 mg/dL — ABNORMAL HIGH (ref 70–99)
Glucose-Capillary: 139 mg/dL — ABNORMAL HIGH (ref 70–99)

## 2024-07-09 LAB — PHOSPHORUS: Phosphorus: 2.9 mg/dL (ref 2.5–4.6)

## 2024-07-09 LAB — MAGNESIUM: Magnesium: 2.2 mg/dL (ref 1.7–2.4)

## 2024-07-09 LAB — PREPARE RBC (CROSSMATCH)

## 2024-07-09 MED ORDER — BISACODYL 5 MG PO TBEC: 10.0000 mg | DELAYED_RELEASE_TABLET | Freq: Every day | ORAL | Status: DC | PRN

## 2024-07-09 MED ORDER — SODIUM CHLORIDE 0.9% IV SOLUTION: Freq: Once | INTRAVENOUS | Status: AC

## 2024-07-09 MED ORDER — IRON SUCROSE 300 MG IVPB - SIMPLE MED
300.0000 mg | Freq: Once | Status: AC
  Administered 2024-07-09: 300 mg via INTRAVENOUS
  Filled 2024-07-09: qty 300

## 2024-07-09 MED ORDER — POLYSACCHARIDE IRON COMPLEX 150 MG PO CAPS
150.0000 mg | ORAL_CAPSULE | Freq: Every day | ORAL | Status: DC
  Administered 2024-07-10 – 2024-07-14 (×5): 150 mg via ORAL
  Filled 2024-07-09 (×5): qty 1

## 2024-07-09 NOTE — Plan of Care (Signed)

## 2024-07-09 NOTE — Progress Notes (Signed)
 Subjective: 3 Days Post-Op Procedure(s) (LRB): OPEN REDUCTION INTERNAL FIXATION HIP (Left) Patient reports pain as mild to moderate.  Slept better. Patient is well, and has had no acute complaints or problems Plan is to go Rehab after hospital stay.  She will return back to [facility]. Negative for chest pain and shortness of breath Fever: no Gastrointestinal: Negative for nausea and vomiting  Objective: Vital signs in last 24 hours: Temp:  [97.9 F (36.6 C)-98 F (36.7 C)] 98 F (36.7 C) (07/17 0400) Pulse Rate:  [93-105] 105 (07/17 0400) Resp:  [13-24] 15 (07/17 0400) BP: (90-113)/(56-73) 101/60 (07/17 0400) SpO2:  [88 %-99 %] 97 % (07/17 0400)  Intake/Output from previous day:  Intake/Output Summary (Last 24 hours) at 07/09/2024 0729 Last data filed at 07/09/2024 0100 Gross per 24 hour  Intake 717 ml  Output 1185 ml  Net -468 ml    Intake/Output this shift: No intake/output data recorded.  Labs: Recent Labs    07/07/24 0956 07/07/24 1625 07/08/24 0459 07/08/24 1650 07/09/24 0425  HGB 8.0* 7.9* 7.5* 7.8* 7.5*   Recent Labs    07/08/24 0459 07/08/24 1650 07/09/24 0425  WBC 12.4*  --  13.1*  RBC 3.05*  --  3.10*  HCT 24.2* 26.1* 24.1*  PLT 275  --  319   Recent Labs    07/08/24 1006 07/09/24 0425  NA 136 137  K 3.9 4.1  CL 103 104  CO2 24 26  BUN 14 9  CREATININE 0.58 0.58  GLUCOSE 236* 151*  CALCIUM  8.4* 8.2*   No results for input(s): LABPT, INR in the last 72 hours.    EXAM General - Patient is Alert and Oriented Extremity - Neurovascular intact Sensation intact distally Dorsiflexion/Plantar flexion intact Compartment soft Dressing/Incision - clean, dry, no drainage.  Bulky dressing. Motor Function - intact, moving foot and toes well on exam.  Ambulated to the chair with physical therapy.  Past Medical History:  Diagnosis Date   Acute on chronic congestive heart failure (HCC) 10/07/2020   Arrhythmia    Atrial fibrillation and  flutter (HCC) 01/06/2019   Plavix  added to eliquis  10-22 after nstemi       Last Assessment & Plan:    Marked facial hematoma post fall but ct of neck and head reportedly normal. No ha or ataxia now but is using a walker. On eliquis  and plavix      Atrial flutter (HCC) 01/2018   new onset    Basal cell carcinoma of back    Basal cell carcinoma of lip    Cerebral aneurysm    followed by [facility]   CHF (congestive heart failure) (HCC)    DDD (degenerative disc disease), lumbar    superior plate depression, L3 08/18/2014   Diabetes mellitus type II, controlled (HCC)    Diverticulosis    Dysrhythmia    Paroxysmal Supraventricular Tachycardia   Dysthymia    depression   GERD (gastroesophageal reflux disease)    History of falling 04/03/2022   History of meniscal tear    Humeral surgical neck fracture 05/16/2022   Hyperlipidemia    Hypertension    Hypokalemia 12/12/2022   Hypothyroidism    Late onset Alzheimer's disease with behavioral disturbance (HCC)    Leukocytosis 10/07/2020   Mild pulmonary hypertension (HCC)    Moderate asthma without complication 12/12/2022   Multifocal pneumonia 12/09/2022   Overactive bladder    Peripheral vascular disease (HCC)    Puncture wound of forehead 06/11/2023   Seasonal allergic  rhinitis    Severe sepsis (HCC) 02/07/2022   Sleep apnea     Assessment/Plan: 3 Days Post-Op Procedure(s) (LRB): OPEN REDUCTION INTERNAL FIXATION HIP (Left) Principal Problem:   Closed left hip fracture (HCC) Active Problems:   Hypothyroidism   Type 2 diabetes mellitus with peripheral neuropathy (HCC)   Hyperlipidemia associated with type 2 diabetes mellitus (HCC)   Leukocytosis   Chronic diastolic CHF (congestive heart failure) (HCC)   Cor pulmonale, chronic (HCC)   Late onset Alzheimer's disease without behavioral disturbance (HCC)   Depression with anxiety   CAD (coronary artery disease)   Chronic atrial fibrillation with RVR (HCC)   Asthma   Fall at home,  initial encounter   Nausea & vomiting   Paroxysmal atrial fibrillation (HCC)   Preop cardiovascular exam   Longstanding persistent atrial fibrillation (HCC)   Fall  Estimated body mass index is 26.3 kg/m as calculated from the following:   Height as of this encounter: 5' 6 (1.676 m).   Weight as of this encounter: 73.9 kg. Advance diet Up with therapy  Acute blood loss anemia status post hip fracture.  Hemoglobin 7.5.  Stable from yesterday.  Transfusion decision with hospitalist.  Discharge planning: Follow-up with [facility] orthopedics in 2 weeks for staple removal and x-rays of the left hip.  Dressing changes as needed.  Plan to discharge to [facility] for rehab.  DVT Prophylaxis - Aspirin , Foot Pumps, and TED hose Weight-Bearing as tolerated to left leg  Krystal Doyne, PA-C Orthopaedic Surgery 07/09/2024, 7:29 AM

## 2024-07-09 NOTE — Progress Notes (Signed)
 Progress Note   Patient: Alison Richardson FMW:[id number] DOB: Feb 26, 1933 DOA: 07/04/2024     5 DOS: the patient was seen and examined on 07/09/2024   Brief hospital course: Alison Richardson is a 89 y.o. female with medical history significant of CAD, recent NSTEMI with stent placement, dCHF, chronic cor pulmonale, A fib on Eliquis , HTN, HDL, DM, asthma, PVD, hypothyroidism, dementia, depression with anxiety, GERD, who presents with fall and left hip pain.  She sustained a left hip fracture, seen by orthopedic surgery, status post ORIF 7/14. Patient also had coronary stents placed 2 months ago, he is seen by cardiology to clear for surgery   Principal Problem:   Closed left hip fracture [facility]) Active Problems:   Chronic atrial fibrillation with RVR (HCC)   Fall at home, initial encounter   CAD (coronary artery disease)   Chronic diastolic CHF (congestive heart failure) (HCC)   Cor pulmonale, chronic (HCC)   Type 2 diabetes mellitus with peripheral neuropathy (HCC)   Hyperlipidemia associated with type 2 diabetes mellitus (HCC)   Hypothyroidism   Asthma   Nausea & vomiting   Late onset Alzheimer's disease without behavioral disturbance (HCC)   Depression with anxiety   Leukocytosis   Paroxysmal atrial fibrillation (HCC)   Preop cardiovascular exam   Longstanding persistent atrial fibrillation (HCC)   Fall   Hypotension   Assessment and Plan:  # Closed left hip fracture # Fall at home, initial encounter  X-ray showed acute comminuted intertrochanteric fracture of the left femur.  She did not have syncope. Cardiology has cleared her for surgery, patient had ORIF 7/14. Doing well today, had an increase leukocytosis, probably from surgery.  Evidence of infection.   Repeat CBC tomorrow.  Acute hypoxemia post op. .  Patient had a significant hypoxemia after surgery did not have short of breath.  Currently on 4 L oxygen.  This is likely due to sedation.  Continue to follow, wean oxygen    Chronic atrial fibrillation with RVR:  -Digoxin  0.0625 mg p.o. daily 7/15 - Metoprolol  25 mg p.o. every 6 hours 7/13 - Discontinued amiodarone  on 7/17 - Midodrine  10 mg p.o. 3 times daily started on 7/16 - Hold off anticoagulation due to anemia - PRBC transfusion to keep hemoglobin >8.0   CAD (coronary artery disease):  Patient has stent placement. Continue aspirin ,  hold Plavix  as well as Eliquis  per cardiology recommendation.     Chronic diastolic CHF and Cor pulmonale, chronic : 2D echo on 06/18/2024 showed EF 60 to 65%.  Patient does not have leg edema or JVD.  No SOB.   Still euvolemic.  Restart diuretics.    Hypophosphatemia Phos repleted. Monitor electrolytes and replete as needed  Anemia secondary to iron  deficiency and low B12 level 7/17 Hb 7.5, transfuse 1 unit of PRBC to keep Hb >8.0 Check CBC daily  Iron  deficiency, Tsat 3%, Venofer  300 mg IV x 2 doses 7/16, & 7/17 start oral supplement with vitamin C  Follow-up with PCP to repeat iron  profile after 3 to 26-month  Vitamin D  deficiency: started vitamin D  50,000 units p.o. weekly, follow with PCP to repeat vitamin D  level after 3 to 6 months.  Vitamin B12 level 210, goal >400: Started vitamin B12 1000 mcg IM injection daily during hospital stay, followed by oral supplement.  Follow-up PCP to repeat vitamin B12 level after 3 to 6 months.   Type 2 diabetes mellitus with peripheral neuropathy [facility]): Recent A1c 8.2, poorly controlled.  Patient is taking Ozempic  Continue sliding  scale insulin , glucose running high, added long-acting insulin .   Hyperlipidemia associated with type 2 diabetes mellitus (HCC) -Pravastatin    Hypothyroidism - Synthroid    Nausea, vomiting:  Resolved.   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 -F/u with PCP  Constipation:  -MiraLAX  twice daily -Dulcolax 10 mg p.o. nightly from 7/17 - Dulcolax suppository one-time dose on 7/16      Subjective:  No significant events overnight, patient was sitting on the recliner at the bedside.  She has feeling a lot better today as compared to yesterday.  Pain is under control, denied any chest pain or palpitation, no nasal complaints   Physical Exam: Vitals:   07/09/24 1319 07/09/24 1400 07/09/24 1455 07/09/24 1553  BP: (!) 98/56 110/67 98/65 114/80  Pulse: 92 100 94 93  Resp: 18 19 20 17   Temp: 98.5 F (36.9 C)  98.4 F (36.9 C)   TempSrc: Oral  Oral   SpO2: 95% 96% 94% 92%  Weight:      Height:       General exam: Appears calm and comfortable  Respiratory system: Clear to auscultation. Respiratory effort normal. Cardiovascular system: S1 & S2 heard, RRR. No JVD, murmurs, rubs, gallops or clicks. No pedal edema. Gastrointestinal system: Abdomen is nondistended, soft and nontender. No organomegaly or masses felt. Normal bowel sounds heard. Central nervous system: Alert and oriented x3. No focal neurological deficits. Extremities: Symmetric 5 x 5 power. Skin: No rashes, lesions or ulcers Psychiatry: Judgement and insight appear normal. Mood & affect appropriate.    Data Reviewed:  Results reviewed.  Family Communication: None  Disposition: Status is: Inpatient Remains inpatient appropriate because: Severity of disease, unsafe discharge.     Time spent: 55 minutes  Author: Elvan Sor, MD 07/09/2024 4:04 PM  For on call review [email or web address].

## 2024-07-09 NOTE — Progress Notes (Signed)
 Nutrition Follow-up  DOCUMENTATION CODES:   Not applicable  INTERVENTION:   -Liberalize diet to 2 gram sodium for wider variety of meal selections -Continue Glucerna Shake po TID, each supplement provides 220 kcal and 10 grams of protein  -Continue MVI with minerals daily  NUTRITION DIAGNOSIS:   Increased nutrient needs related to post-op healing as evidenced by estimated needs.  Ongoing  GOAL:   Patient will meet greater than or equal to 90% of their needs  Progressing   MONITOR:   PO intake, Supplement acceptance, Diet advancement  REASON FOR ASSESSMENT:   Consult Assessment of nutrition requirement/status, Hip fracture protocol  ASSESSMENT:   Pt with medical history significant of CAD, recent NSTEMI with stent placement, dCHF, chronic cor pulmonale, A fib on Eliquis , HTN, HDL, DM, asthma, PVD, hypothyroidism, dementia, depression with anxiety, GERD, who presents with fall and left hip pain.  7/14- s/p OPEN REDUCTION INTERNAL FIXATION HIP   Reviewed I/O's: -468 ml x 24 hours and -689 ml since admission  UOP: 1.2 L x 24 hours   Pt receiving personal care at time of visit.  Pt on a heart healthy diet. Noted fair intake; PO 60-90%. Pt is drinking Glucerna supplements.   Wt has been stable since admission.   Per TOC notes, plan for SNF at discharge (from [facility] [place]).   Medications reviewed and include vitamin C , dulcolax, vitamin B-12, neurontin , lopressor , miralax , and vitamin D .   Labs reviewed: CBGS: 165-265 (inpatient orders for glycemic control are 0-5 units insulin  aspart daily at bedtime, 0-9 units insulin  aspart TID with meals, and 10 units insulin   glarfine-yfgn daily).    Diet Order:   Diet Order             Diet 2 gram sodium Fluid consistency: Thin  Diet effective now                   EDUCATION NEEDS:   Education needs have been addressed  Skin:  Skin Assessment: Skin Integrity Issues: Skin Integrity Issues::  Incisions Incisions: clsoed lt hip  Last BM:  07/08/24 (type 7)  Height:   Ht Readings from Last 1 Encounters:  07/06/24 5' 6 (1.676 m)    Weight:   Wt Readings from Last 1 Encounters:  07/07/24 73.9 kg    Ideal Body Weight:  59.1 kg  BMI:  Body mass index is 26.3 kg/m.  Estimated Nutritional Needs:   Kcal:  1600-1800  Protein:  85-100 grams  Fluid:  1.6-1.8 L    Margery ORN, RD, LDN, CDCES Registered Dietitian III Certified Diabetes Care and Education Specialist If unable to reach this RD, please use RD Inpatient group chat on secure chat between hours of 8am-4 pm daily

## 2024-07-09 NOTE — Plan of Care (Signed)
  Problem: Clinical Measurements: Goal: Ability to maintain clinical measurements within normal limits will improve Outcome: Progressing   Problem: Clinical Measurements: Goal: Cardiovascular complication will be avoided Outcome: Progressing   Problem: Clinical Measurements: Goal: Respiratory complications will improve Outcome: Progressing   Problem: Activity: Goal: Risk for activity intolerance will decrease Outcome: Progressing   Problem: Pain Managment: Goal: General experience of comfort will improve and/or be controlled Outcome: Progressing

## 2024-07-09 NOTE — Plan of Care (Signed)
  Problem: Tissue Perfusion: Goal: Adequacy of tissue perfusion will improve Outcome: Progressing   Problem: Education: Goal: Knowledge of General Education information will improve Description: Including pain rating scale, medication(s)/side effects and non-pharmacologic comfort measures Outcome: Progressing   Problem: Clinical Measurements: Goal: Ability to maintain clinical measurements within normal limits will improve Outcome: Progressing   Problem: Clinical Measurements: Goal: Respiratory complications will improve Outcome: Progressing   Problem: Clinical Measurements: Goal: Cardiovascular complication will be avoided Outcome: Progressing   Problem: Activity: Goal: Risk for activity intolerance will decrease Outcome: Progressing   Problem: Pain Managment: Goal: General experience of comfort will improve and/or be controlled Outcome: Progressing   Problem: Safety: Goal: Ability to remain free from injury will improve Outcome: Progressing

## 2024-07-09 NOTE — Progress Notes (Signed)
 Physical Therapy Treatment Patient Details Name: Alison Richardson MRN: [id number] DOB: [birth year] Today's Date: 07/09/2024   History of Present Illness Alison Richardson is a 89 y.o. female with medical history significant of CAD, recent NSTEMI with stent placement, dCHF, chronic cor pulmonale, A fib on Eliquis , HTN, HDL, DM, asthma, PVD, hypothyroidism, dementia, depression with anxiety, GERD, who presents with fall and left hip pain.  She sustained a left hip fracture, s/p L hip ORIF on 07/17/24, WBAT    PT Comments  Pt seen for PT tx with pt received standing in room with nurse assisting pt to [facility]. Pt forgetful of sitting on BSC, ultimately unable to have BM. Pt requires mod assist for sit>stand from [facility], elevated EOB with cuing re: hand placement, assistance to power up. Pt is able to complete step pivot transfers with significantly extra time 2/2 increased pain with weight bearing & decreased ability to weight shift to LLE to advance RLE during transfer. Pt able to take side steps to EOB, notes 25 out of 10 fatigue at end of session. Pt politely declined exercises on this date. Pt assisted back to bed & left with all needs met.  Continue to recommend post acute rehab <3 hours therapy/day upon d/c.   If plan is discharge home, recommend the following: A lot of help with bathing/dressing/bathroom;Assistance with feeding;Assistance with cooking/housework;Assist for transportation;Help with stairs or ramp for entrance;A lot of help with walking and/or transfers   Can travel by private vehicle     No  Equipment Recommendations  Other (comment) (defer to next venue)    Recommendations for Other Services       Precautions / Restrictions Precautions Precautions: Fall Recall of Precautions/Restrictions: Impaired Restrictions Weight Bearing Restrictions Per Provider Order: Yes LLE Weight Bearing Per Provider Order: Weight bearing as tolerated     Mobility  Bed Mobility Overal bed mobility:  Needs Assistance Bed Mobility: Sit to Supine       Sit to supine: Min assist (assistance to elevate LLE onto bed)   General bed mobility comments: cuing for repositioning straight in bed    Transfers Overall transfer level: Needs assistance Equipment used: Rolling walker (2 wheels) Transfers: Sit to/from Stand, Bed to chair/wheelchair/BSC Sit to Stand: Mod assist (sit>stand from BSC, elevated EOB, cuing re: hand placement, anterior weight shifting (fair<>poor return demo), assistance to power up & extra time to transition BUE to RW)   Step pivot transfers: Mod assist (BSC>bed, stepping with cuing to weight bear through BUE to offload LLE when stepping with RLE, pt with limited ability to weight bear through LLE, decreased R foot clearance, scoots R foot vs steps)            Ambulation/Gait Ambulation/Gait assistance: Mod assist Gait Distance (Feet): 3 Feet Assistive device: Rolling walker (2 wheels) Gait Pattern/deviations: Decreased step length - right, Decreased step length - left, Decreased stride length Gait velocity: decreased     General Gait Details: Pt takes side steps to R along EOB with RW & cuing re: technique. Pt with decreased R foot clearance, scoots it rather than steps. Decreased weight shift to LLE 2/2 pain.   Stairs             Wheelchair Mobility     Tilt Bed    Modified Rankin (Stroke Patients Only)       Balance Overall balance assessment: Needs assistance Sitting-balance support: Feet supported, No upper extremity supported, Bilateral upper extremity supported, Single extremity supported Sitting balance-Leahy Scale: Fair Sitting  balance - Comments: supervision static sitting EOB   Standing balance support: Bilateral upper extremity supported, Reliant on assistive device for balance, During functional activity Standing balance-Leahy Scale: Poor                              Communication Communication Communication:  Impaired Factors Affecting Communication: Hearing impaired (does not have hearing aides here with her)  Cognition Arousal: Alert Behavior During Therapy: WFL for tasks assessed/performed   PT - Cognitive impairments: Orientation, Awareness, Problem solving, Safety/Judgement, Memory                       PT - Cognition Comments: not oriented to age (reports she's 89 vs 40, asks PT again at end of session am I 89?), requires cuing for sequencing transfers Following commands: Impaired Following commands impaired: Follows one step commands with increased time    Cueing Cueing Techniques: Verbal cues, Tactile cues, Gestural cues, Visual cues  Exercises      General Comments        Pertinent Vitals/Pain Pain Assessment Pain Assessment: Faces Faces Pain Scale: Hurts whole lot Pain Location: L hip with movement, weight bearing Pain Descriptors / Indicators: Discomfort, Grimacing, Guarding Pain Intervention(s): Monitored during session, RN gave pain meds during session, Limited activity within patient's tolerance, Repositioned    Home Living                          Prior Function            PT Goals (current goals can now be found in the care plan section) Acute Rehab PT Goals Patient Stated Goal: to go home PT Goal Formulation: With patient Time For Goal Achievement: 07/21/24 Potential to Achieve Goals: Fair Progress towards PT goals: Progressing toward goals    Frequency    7X/week      PT Plan      Co-evaluation              AM-PAC PT 6 Clicks Mobility   Outcome Measure  Help needed turning from your back to your side while in a flat bed without using bedrails?: A Little Help needed moving from lying on your back to sitting on the side of a flat bed without using bedrails?: A Lot Help needed moving to and from a bed to a chair (including a wheelchair)?: A Lot Help needed standing up from a chair using your arms (e.g., wheelchair or  bedside chair)?: A Lot Help needed to walk in hospital room?: Total Help needed climbing 3-5 steps with a railing? : Total 6 Click Score: 11    End of Session Equipment Utilized During Treatment: Gait belt;Oxygen Activity Tolerance: Patient limited by pain;Patient limited by fatigue Patient left: in bed;with bed alarm set;with SCD's reapplied;with call bell/phone within reach Nurse Communication: Mobility status PT Visit Diagnosis: Other abnormalities of gait and mobility (R26.89);Difficulty in walking, not elsewhere classified (R26.2);Muscle weakness (generalized) (M62.81);Pain Pain - Right/Left: Left Pain - part of body: Hip     Time: 8492-8465 PT Time Calculation (min) (ACUTE ONLY): 27 min  Charges:    $Therapeutic Activity: 23-37 mins PT General Charges $$ ACUTE PT VISIT: 1 Visit                     Richerd Pinal, PT, DPT 07/09/24, 3:45 PM   Richerd CHRISTELLA Pinal 07/09/2024, 3:45 PM

## 2024-07-09 NOTE — Progress Notes (Signed)
 Rounding Note   Patient Name: Alison Richardson Date of Encounter: 07/09/2024  [facility] Cardiologist: Alison Lunger, MD   Subjective Resting comfortably in the chair Denies significant shortness of breath or chest discomfort No significant leg swelling Telemetry reviewed, remains in atrial fibrillation rate 90 bpm Started on midodrine  10mg  3 times daily yesterday On metoprolol  25 every 6 hours and digoxin  Systolic pressure remains around 100  Scheduled Meds:  sodium chloride    Intravenous Once   vitamin C   500 mg Oral Daily   aspirin  EC  81 mg Oral Daily   bisacodyl   10 mg Oral QHS   Chlorhexidine  Gluconate Cloth  6 each Topical Daily   cyanocobalamin   1,000 mcg Intramuscular Q1200   Followed by   Alison Richardson ON 07/15/2024] vitamin B-12  1,000 mcg Oral Daily   digoxin   0.0625 mg Oral Daily   donepezil   10 mg Oral QHS   famotidine   20 mg Oral BID AC   feeding supplement (GLUCERNA SHAKE)  237 mL Oral TID BM   gabapentin   100 mg Oral QHS   insulin  aspart  0-5 Units Subcutaneous QHS   insulin  aspart  0-9 Units Subcutaneous TID WC   insulin  glargine-yfgn  10 Units Subcutaneous Daily   [START ON 07/10/2024] iron  polysaccharides  150 mg Oral Daily   levothyroxine   88 mcg Oral Q0600   lidocaine   1 patch Transdermal Q24H   metoprolol  tartrate  25 mg Oral Q6H   midodrine   10 mg Oral TID WC   multivitamin with minerals  1 tablet Oral Daily   polyethylene glycol  17 g Oral BID   pravastatin   20 mg Oral QHS   venlafaxine  XR  75 mg Oral Daily   Vitamin D  (Ergocalciferol )  50,000 Units Oral Q7 days   Continuous Infusions:  PRN Meds: acetaminophen , albuterol , bisacodyl , dextromethorphan -guaiFENesin , hydrALAZINE , methocarbamol , metoprolol  tartrate, morphine  injection, nitroGLYCERIN , ondansetron  (ZOFRAN ) IV, oxyCODONE -acetaminophen , torsemide    Vital Signs  Vitals:   07/09/24 0800 07/09/24 0948 07/09/24 0952 07/09/24 1152  BP: 104/73  104/73   Pulse:  (!) 108 (!) 108    Resp: (!) 22     Temp:      TempSrc:      SpO2:    (!) 87%  Weight:      Height:        Intake/Output Summary (Last 24 hours) at 07/09/2024 1244 Last data filed at 07/09/2024 1239 Gross per 24 hour  Intake 814 ml  Output 1435 ml  Net -621 ml      07/07/2024    5:00 AM 07/06/2024   12:00 AM 07/04/2024    6:23 PM  Last 3 Weights  Weight (lbs) 162 lb 14.7 oz 157 lb 10.1 oz 160 lb  Weight (kg) 73.9 kg 71.5 kg 72.576 kg      Telemetry  - Personally Reviewed  ECG   - Personally Reviewed  Physical Exam  GEN: No acute distress.   Neck: No JVD Cardiac: Irregularly irregular, no murmurs, rubs, or gallops.  Respiratory: Clear to auscultation bilaterally. GI: Soft, nontender, non-distended  MS: No edema; No deformity. Neuro:  Nonfocal  Psych: Normal affect   Labs High Sensitivity Troponin:  No results for input(s): TROPONINIHS in the last 720 hours.   Chemistry Recent Labs  Lab 07/04/24 1827 07/05/24 0906 07/06/24 2233 07/08/24 1006 07/09/24 0425  NA 136   < > 134* 136 137  K 3.8   < > 4.5 3.9 4.1  CL 103   < >  99 103 104  CO2 19*   < > 22 24 26   GLUCOSE 229*   < > 233* 236* 151*  BUN 18   < > 16 14 9   CREATININE 0.84   < > 0.86 0.58 0.58  CALCIUM  8.9   < > 8.2* 8.4* 8.2*  MG  --    < > 1.9 2.2 2.2  PROT 7.4  --   --   --   --   ALBUMIN 4.1  --   --   --   --   AST 32  --   --   --   --   ALT 26  --   --   --   --   ALKPHOS 78  --   --   --   --   BILITOT 0.6  --   --   --   --   GFRNONAA >60   < > >60 >60 >60  ANIONGAP 14   < > 13 9 7    < > = values in this interval not displayed.    Lipids No results for input(s): CHOL, TRIG, HDL, LABVLDL, LDLCALC, CHOLHDL in the last 168 hours.  Hematology Recent Labs  Lab 07/07/24 1625 07/08/24 0459 07/08/24 1650 07/09/24 0425  WBC 17.2* 12.4*  --  13.1*  RBC 3.22* 3.05*  --  3.10*  HGB 7.9* 7.5* 7.8* 7.5*  HCT 25.2* 24.2* 26.1* 24.1*  MCV 78.3* 79.3*  --  77.7*  MCH 24.5* 24.6*  --  24.2*  MCHC  31.3 31.0  --  31.1  RDW 15.6* 15.7*  --  15.8*  PLT 273 275  --  319   Thyroid  No results for input(s): TSH, FREET4 in the last 168 hours.  BNP Recent Labs  Lab 07/04/24 1949  BNP 175.0*    DDimer No results for input(s): DDIMER in the last 168 hours.   Radiology  No results found.  Cardiac Studies   Patient Profile   89 y.o. female with a hx of CAD, persistent atrial fibrillation, HFpEF, valvular heart disease, type 2 diabetes, hypertension, hyperlipidemia, and hyperthyroidism, admitted 7/13 after mechanical fall causing L hip fracture, who is being seen for the ongoing management of atrial fibrillation   Assessment & Plan  Persistent atrial fibrillation with RVR History of paroxysmal atrial fibrillation, hospitalization May 2025 amiodarone  discontinued for prolonged QT, concern for amiodarone  toxicity -This admission found to be back in atrial fibrillation with RVR, was restarted on amiodarone  infusion for rate control - Medication rate control limited secondary to hypotension Blood pressure tolerating metoprolol  tartrate 25 every 6 hours,  Also on digoxin  Started on midodrine  5 mg with titration up to 10 mg 3 times a day yesterday, systolic pressure remains around 100 - Elevated rate also driven by blood loss anemia, left hip fracture, pain control, plan for transfusion today -Will hold amiodarone  infusion (prior history of prolonged QT, not on anticoagulation)  and monitor on metoprolol  and digoxin  alone -Further titration up on metoprolol  as blood pressure tolerates   Fall, closed left hip fracture Postop day 3 followed by orthopedics On aspirin ,  Eliquis  Plavix  held until hemoglobin stabilizes Blood transfusion today then could consider restarting Eliquis , with Plavix    Blood loss anemia Baseline hemoglobin of 12, now running 7.5 -Consider transfusion, discussed with hospitalist service Although high risk of stroke and stent thrombosis,  Eliquis  and Plavix  on  hold -Following transfusion consider Eliquis , Plavix    Coronary artery disease with stable angina Non-STEMI  May 2025, stent placed proximal and distal RCA Eliquis  and Plavix  on hold per interventional cardiology, On aspirin , consider plan as above after transfusion   Chronic diastolic CHF Low ejection fraction in the setting of non-STEMI May 2025 Ejection fraction has now normalized June 18, 2024 EF 60 to 65% -Appears euvolemic - Spironolactone , losartan  on hold in the setting of atrial fibrillation with RVR, challenging rate control  -Torsemide  as needed, not scheduled at this time  For questions or updates, please contact [facility] Please consult [email or web address] for contact info under     Signed, Alexxis Mackert, MD  07/09/2024, 12:44 PM

## 2024-07-09 NOTE — Progress Notes (Signed)
 Attempted to call pt's daughter elisa to advise of blood transfusion. Pt voices understanding to blood transfusion, reason for transfusion and risk/benefits of blood transfusion. Pt signed blood transfusion consent.

## 2024-07-10 DIAGNOSIS — I4819 Other persistent atrial fibrillation: Secondary | ICD-10-CM | POA: Diagnosis not present

## 2024-07-10 DIAGNOSIS — I4892 Unspecified atrial flutter: Secondary | ICD-10-CM

## 2024-07-10 DIAGNOSIS — S72002A Fracture of unspecified part of neck of left femur, initial encounter for closed fracture: Secondary | ICD-10-CM | POA: Diagnosis not present

## 2024-07-10 DIAGNOSIS — I5030 Unspecified diastolic (congestive) heart failure: Secondary | ICD-10-CM | POA: Diagnosis not present

## 2024-07-10 DIAGNOSIS — I2510 Atherosclerotic heart disease of native coronary artery without angina pectoris: Secondary | ICD-10-CM | POA: Diagnosis not present

## 2024-07-10 LAB — BASIC METABOLIC PANEL WITH GFR
Anion gap: 7 (ref 5–15)
BUN: 10 mg/dL (ref 8–23)
CO2: 27 mmol/L (ref 22–32)
Calcium: 8.4 mg/dL — ABNORMAL LOW (ref 8.9–10.3)
Chloride: 106 mmol/L (ref 98–111)
Creatinine, Ser: 0.52 mg/dL (ref 0.44–1.00)
GFR, Estimated: >60 mL/min (ref >60)
Glucose, Bld: 126 mg/dL — ABNORMAL HIGH (ref 70–99)
Potassium: 4 mmol/L (ref 3.5–5.1)
Sodium: 140 mmol/L (ref 135–145)

## 2024-07-10 LAB — TYPE AND SCREEN
ABO/RH(D): O POS
Antibody Screen: NEGATIVE
Unit division: 0

## 2024-07-10 LAB — GLUCOSE, CAPILLARY
Glucose-Capillary: 167 mg/dL — ABNORMAL HIGH (ref 70–99)
Glucose-Capillary: 218 mg/dL — ABNORMAL HIGH (ref 70–99)
Glucose-Capillary: 127 mg/dL — ABNORMAL HIGH (ref 70–99)
Glucose-Capillary: 156 mg/dL — ABNORMAL HIGH (ref 70–99)

## 2024-07-10 LAB — CBC
HCT: 28.7 % — ABNORMAL LOW (ref 36.0–46.0)
Hemoglobin: 8.8 g/dL — ABNORMAL LOW (ref 12.0–15.0)
MCH: 24.4 pg — ABNORMAL LOW (ref 26.0–34.0)
MCHC: 30.7 g/dL (ref 30.0–36.0)
MCV: 79.5 fL — ABNORMAL LOW (ref 80.0–100.0)
Platelets: 361 K/uL (ref 150–400)
RBC: 3.61 MIL/uL — ABNORMAL LOW (ref 3.87–5.11)
RDW: 15.9 % — ABNORMAL HIGH (ref 11.5–15.5)
WBC: 12 K/uL — ABNORMAL HIGH (ref 4.0–10.5)
nRBC: 1 % — ABNORMAL HIGH (ref 0.0–0.2)

## 2024-07-10 LAB — BPAM RBC
Blood Product Expiration Date: 202508092359
ISSUE DATE / TIME: 202507171258
Unit Type and Rh: 5100

## 2024-07-10 LAB — MAGNESIUM: Magnesium: 2.1 mg/dL (ref 1.7–2.4)

## 2024-07-10 LAB — PHOSPHORUS: Phosphorus: 3.2 mg/dL (ref 2.5–4.6)

## 2024-07-10 LAB — DIGOXIN LEVEL: Digoxin Level: 0.6 ng/mL — ABNORMAL LOW (ref 0.8–2.0)

## 2024-07-10 MED ORDER — METOPROLOL TARTRATE 50 MG PO TABS
50.0000 mg | ORAL_TABLET | Freq: Two times a day (BID) | ORAL | Status: DC
  Administered 2024-07-10 – 2024-07-14 (×8): 50 mg via ORAL
  Filled 2024-07-10 (×8): qty 1

## 2024-07-10 MED ORDER — APIXABAN 5 MG PO TABS
5.0000 mg | ORAL_TABLET | Freq: Two times a day (BID) | ORAL | Status: DC
  Administered 2024-07-10 – 2024-07-14 (×9): 5 mg via ORAL
  Filled 2024-07-10 (×9): qty 1

## 2024-07-10 MED ORDER — MIDODRINE HCL 5 MG PO TABS
5.0000 mg | ORAL_TABLET | Freq: Three times a day (TID) | ORAL | Status: DC
Start: 2024-07-10 — End: 2024-07-14
  Administered 2024-07-10 – 2024-07-14 (×12): 5 mg via ORAL
  Filled 2024-07-10 (×12): qty 1

## 2024-07-10 NOTE — Progress Notes (Signed)
 Physical Therapy Treatment Patient Details Name: Alison Richardson MRN: [id number] DOB: 12/21/1933 Today's Date: 07/10/2024   History of Present Illness Alison Richardson is a 89 y.o. female with medical history significant of CAD, recent NSTEMI with stent placement, dCHF, chronic cor pulmonale, A fib on Eliquis , HTN, HDL, DM, asthma, PVD, hypothyroidism, dementia, depression with anxiety, GERD, who presents with fall and left hip pain.  She sustained a left hip fracture, s/p L hip ORIF on 07/17/24, WBAT    PT Comments  Patient alert, oriented to self, place, agreeable to PT/OT to transfer to recliner to finish breakfast. Pt demonstrated improved ability to initiate mobility today. Provided with towel to assist LLE towards EOB, minAx2 to complete trunk elevation/weight shift. Sit <> stand with minAx2 as well with RW and she was able to step pivot to recliner minAx2 with RW. Pt needed max mulitmodal cueing for technique, and two standing rest breaks with CGA prior to finishing the transfer to recliner. Able to lift RLE from floor for a half step once, primarily pivots on foot. The patient would benefit from further skilled PT intervention to continue to progress towards goals.     If plan is discharge home, recommend the following: A lot of help with bathing/dressing/bathroom;Assistance with feeding;Assistance with cooking/housework;Assist for transportation;Help with stairs or ramp for entrance;A lot of help with walking and/or transfers   Can travel by private vehicle     No  Equipment Recommendations  Other (comment) (TBD)    Recommendations for Other Services       Precautions / Restrictions Precautions Precautions: Fall Recall of Precautions/Restrictions: Impaired Restrictions Weight Bearing Restrictions Per Provider Order: Yes LLE Weight Bearing Per Provider Order: Weight bearing as tolerated     Mobility  Bed Mobility Overal bed mobility: Needs Assistance Bed Mobility: Supine to Sit      Supine to sit: Min assist, +2 for physical assistance, Used rails     General bed mobility comments: use of blanket to move LLE    Transfers Overall transfer level: Needs assistance Equipment used: Rolling walker (2 wheels) Transfers: Sit to/from Stand, Bed to chair/wheelchair/BSC Sit to Stand: Min assist, +2 safety/equipment   Step pivot transfers: Min assist, +2 safety/equipment       General transfer comment: able to lift RLE from ground today but still remains very challenging to take a true step    Ambulation/Gait                   Stairs             Wheelchair Mobility     Tilt Bed    Modified Rankin (Stroke Patients Only)       Balance Overall balance assessment: Needs assistance Sitting-balance support: No upper extremity supported, Feet supported Sitting balance-Leahy Scale: Fair Sitting balance - Comments: supervision static sitting EOB   Standing balance support: Bilateral upper extremity supported, Reliant on assistive device for balance, During functional activity Standing balance-Leahy Scale: Fair Standing balance comment: able to statically stand with BUE support, but minA for dynamic tasks, step pivot                            Communication Communication Communication: No apparent difficulties  Cognition Arousal: Alert Behavior During Therapy: WFL for tasks assessed/performed   PT - Cognitive impairments: History of cognitive impairments  PT - Cognition Comments: oriented to self, place Following commands: Impaired Following commands impaired: Follows one step commands with increased time    Cueing Cueing Techniques: Verbal cues, Gestural cues, Tactile cues  Exercises      General Comments General comments (skin integrity, edema, etc.): SpO2 86% on 1L w/ activity      Pertinent Vitals/Pain Pain Assessment Pain Assessment: Faces Faces Pain Scale: Hurts little more Pain  Location: L hip with movement, weight bearing Pain Descriptors / Indicators: Discomfort, Grimacing, Moaning Pain Intervention(s): Limited activity within patient's tolerance, Monitored during session, Premedicated before session, Repositioned    Home Living                          Prior Function            PT Goals (current goals can now be found in the care plan section) Progress towards PT goals: Progressing toward goals    Frequency    7X/week      PT Plan      Co-evaluation PT/OT/SLP Co-Evaluation/Treatment: Yes Reason for Co-Treatment: To address functional/ADL transfers;For patient/therapist safety PT goals addressed during session: Mobility/safety with mobility;Balance;Proper use of DME OT goals addressed during session: ADL's and self-care      AM-PAC PT 6 Clicks Mobility   Outcome Measure  Help needed turning from your back to your side while in a flat bed without using bedrails?: A Lot Help needed moving from lying on your back to sitting on the side of a flat bed without using bedrails?: A Lot Help needed moving to and from a bed to a chair (including a wheelchair)?: A Lot Help needed standing up from a chair using your arms (e.g., wheelchair or bedside chair)?: A Lot Help needed to walk in hospital room?: A Lot Help needed climbing 3-5 steps with a railing? : Total 6 Click Score: 11    End of Session Equipment Utilized During Treatment: Gait belt;Oxygen Activity Tolerance: Patient limited by fatigue Patient left: in chair;with call bell/phone within reach;with chair alarm set Nurse Communication: Mobility status PT Visit Diagnosis: Other abnormalities of gait and mobility (R26.89);Difficulty in walking, not elsewhere classified (R26.2);Muscle weakness (generalized) (M62.81);Pain Pain - Right/Left: Left Pain - part of body: Hip     Time: 9074-9061 PT Time Calculation (min) (ACUTE ONLY): 13 min  Charges:    $Therapeutic Activity: 8-22  mins PT General Charges $$ ACUTE PT VISIT: 1 Visit                     Doyal Shams PT, DPT 11:17 AM,07/10/24

## 2024-07-10 NOTE — Progress Notes (Signed)
 Progress Note   Patient: Alison Richardson FMW:[id number] DOB: May 07, 1933 DOA: 07/04/2024     6 DOS: the patient was seen and examined on 07/10/2024   Brief hospital course: Pleshette Tomasini is a 89 y.o. female with medical history significant of CAD, recent NSTEMI with stent placement, dCHF, chronic cor pulmonale, A fib on Eliquis , HTN, HDL, DM, asthma, PVD, hypothyroidism, dementia, depression with anxiety, GERD, who presents with fall and left hip pain.  She sustained a left hip fracture, seen by orthopedic surgery, status post ORIF 7/14. Patient also had coronary stents placed 2 months ago, he is seen by cardiology to clear for surgery   Principal Problem:   Closed left hip fracture [facility]) Active Problems:   Chronic atrial fibrillation with RVR (HCC)   Fall at home, initial encounter   CAD (coronary artery disease)   Chronic diastolic CHF (congestive heart failure) (HCC)   Cor pulmonale, chronic (HCC)   Type 2 diabetes mellitus with peripheral neuropathy (HCC)   Hyperlipidemia associated with type 2 diabetes mellitus (HCC)   Hypothyroidism   Asthma   Nausea & vomiting   Late onset Alzheimer's disease without behavioral disturbance (HCC)   Depression with anxiety   Leukocytosis   Paroxysmal atrial fibrillation (HCC)   Preop cardiovascular exam   Longstanding persistent atrial fibrillation (HCC)   Fall   Hypotension   Assessment and Plan:  # Closed left hip fracture # Fall at home, initial encounter  X-ray showed acute comminuted intertrochanteric fracture of the left femur.  She did not have syncope. Cardiology has cleared her for surgery, patient had ORIF 7/14. Leukocytosis, most likely reactive,  No Evidence of infection.   Check CBC daily  Acute hypoxemia post op. .  Patient had a significant hypoxemia after surgery did not have short of breath.  Currently on 4 L oxygen.  This is likely due to sedation.  Continue to follow, wean oxygen   Chronic atrial fibrillation with  RVR:  -Digoxin  0.0625 mg p.o. daily 7/15 - Metoprolol  25 mg p.o. every 6 hours 7/13 - Discontinued amiodarone  on 7/17 - Midodrine  10 mg p.o. 3 times daily started on 7/16 - Hold off anticoagulation due to anemia - PRBC transfusion to keep hemoglobin >8.0 - Eliquis  5 mg p.o. twice daily started on 7/18   CAD (coronary artery disease):  Patient has stent placement. Continue aspirin , and resumed Eliquis  on 7/18 hold off Plavix  for now Cardiology following.      Chronic diastolic CHF and Cor pulmonale, chronic : 2D echo on 06/18/2024 showed EF 60 to 65%.  Patient does not have leg edema or JVD.  No SOB.   Still euvolemic.  Restart diuretics.    Hypophosphatemia: Phos repleted. Monitor electrolytes and replete as needed  Anemia secondary to iron  deficiency and low B12 level 7/17 Hb 7.5, transfuse 1 unit of PRBC to keep Hb >8.0 7/18 Hb 8.8, posttransfusion Check CBC daily  Iron  deficiency, Tsat 3%, Venofer  300 mg IV x 2 doses 7/16, & 7/17 start oral supplement with vitamin C  Follow-up with PCP to repeat iron  profile after 3 to 30-month  Vitamin D  deficiency: started vitamin D  50,000 units p.o. weekly, follow with PCP to repeat vitamin D  level after 3 to 6 months.  Vitamin B12 level 210, goal >400: Started vitamin B12 1000 mcg IM injection daily during hospital stay, followed by oral supplement.  Follow-up PCP to repeat vitamin B12 level after 3 to 6 months.   Type 2 diabetes mellitus with peripheral neuropathy (HCC):  Recent A1c 8.2, poorly controlled.  Patient is taking Ozempic  Continue sliding scale insulin , glucose running high, added long-acting insulin .   Hyperlipidemia associated with type 2 diabetes mellitus (HCC) -Pravastatin    Hypothyroidism - Synthroid    Nausea, vomiting:  Resolved.   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 -F/u with PCP  Constipation:  -MiraLAX  twice daily -Dulcolax 10 mg p.o. nightly from 7/17 - Dulcolax suppository one-time dose on 7/16      Subjective:  No significant events overnight, complaining of significant pain in the left hip 9/10.  Patient was advised to continue taking as needed pain medications. Denied any other complaints.    Physical Exam: Vitals:   07/10/24 0941 07/10/24 1000 07/10/24 1115 07/10/24 1400  BP: 103/67 120/78 116/71 114/61  Pulse: (!) 103 (!) 104 97 84  Resp:  17  14  Temp:   98.1 F (36.7 C)   TempSrc:   Oral   SpO2:  93%  92%  Weight:      Height:       General exam: Appears calm and comfortable  Respiratory system: Clear to auscultation. Respiratory effort normal. Cardiovascular system: S1 & S2 heard, RRR. No JVD, murmurs, rubs, gallops or clicks. No pedal edema. Gastrointestinal system: Abdomen is nondistended, soft and nontender. No organomegaly or masses felt. Normal bowel sounds heard. Central nervous system: Alert and oriented x3. No focal neurological deficits. Extremities: Symmetric 5 x 5 power. Skin: No rashes, lesions or ulcers Psychiatry: Judgement and insight appear normal. Mood & affect appropriate.    Data Reviewed:  Results reviewed.  Family Communication: None  Disposition: Status is: Inpatient Remains inpatient appropriate because: Severity of disease, unsafe discharge.     Time spent: 40 minutes  Author: Elvan Sor, MD 07/10/2024 3:22 PM  For on call review [email or web address].

## 2024-07-10 NOTE — Plan of Care (Signed)

## 2024-07-10 NOTE — Progress Notes (Signed)
 Occupational Therapy Treatment Patient Details Name: Alison Richardson MRN: [id number] DOB: [birth year] Today's Date: 07/10/2024   History of present illness Alison Richardson is a 89 y.o. female with medical history significant of CAD, recent NSTEMI with stent placement, dCHF, chronic cor pulmonale, A fib on Eliquis , HTN, HDL, DM, asthma, PVD, hypothyroidism, dementia, depression with anxiety, GERD, who presents with fall and left hip pain.  She sustained a left hip fracture, s/p L hip ORIF on 07/17/24, WBAT   OT comments  Alison Richardson seen for OT treatment on this date. Upon arrival to room pt in bed, agreeable to tx. Pt requires MIN A +2 for safety for bed mobility; MIN A + RW +2 for safety and line management for STS and t/f from bed>chair. Pt demonstrated good insight in pacing of movement and took breaks when needed. SpO2 86% on 1L w/ mobility; pt educated on pursed lip breathing and proper use of IS.   Orientation assessed; The Orientation Log (O-Log) is designed to be a quick quantitative measure of orientational status for use at bedside with rehabilitation inpatients. Place, time, and situational (Etiology/Event + Pathology/Deficits) domains are assessed and can be tracked over time. Patient responses are scored based on spontaneous recall, logical cueing, multiple choice, and incorrect responses. Pt scored 26/30; improved orientation from prior session. Pt making good progress toward goals, will continue to follow POC. Discharge recommendation remains appropriate.        If plan is discharge home, recommend the following:      Equipment Recommendations       Recommendations for Other Services      Precautions / Restrictions Precautions Precautions: Fall Recall of Precautions/Restrictions: Impaired Restrictions Weight Bearing Restrictions Per Provider Order: Yes LLE Weight Bearing Per Provider Order: Weight bearing as tolerated       Mobility Bed Mobility Overal bed mobility: Needs  Assistance Bed Mobility: Supine to Sit     Supine to sit: Min assist, +2 for physical assistance, Used rails (+ HHA)          Transfers Overall transfer level: Needs assistance Equipment used: Rolling walker (2 wheels) Transfers: Sit to/from Stand, Bed to chair/wheelchair/BSC Sit to Stand: Min assist, +2 safety/equipment     Step pivot transfers: Min assist, +2 safety/equipment           Balance Overall balance assessment: Needs assistance Sitting-balance support: No upper extremity supported, Feet supported Sitting balance-Alison Richardson Scale: Fair     Standing balance support: Bilateral upper extremity supported, Reliant on assistive device for balance Standing balance-Alison Richardson Scale: Poor                             ADL either performed or assessed with clinical judgement   ADL Overall ADL's : Needs assistance/impaired                                       General ADL Comments: SETUP for seated grooming and self-feeding    Extremity/Trunk Assessment Upper Extremity Assessment Upper Extremity Assessment: Overall WFL for tasks assessed   Lower Extremity Assessment Lower Extremity Assessment: Generalized weakness        Vision       Perception     Praxis     Communication Communication Communication: No apparent difficulties   Cognition Arousal: Alert Behavior During Therapy: WFL for tasks assessed/performed Cognition: Cognition impaired  Orientation impairments: Time         OT - Cognition Comments: Not oriented to date or DOW                 Following commands: Impaired Following commands impaired: Follows one step commands with increased time      Cueing   Cueing Techniques: Verbal cues, Gestural cues, Tactile cues  Exercises      Shoulder Instructions       General Comments SpO2 86% on 1L w/ activity    Pertinent Vitals/ Pain       Pain Assessment Faces Pain Scale: Hurts little more Pain Location: L  hip with movement, weight bearing Pain Descriptors / Indicators: Discomfort, Grimacing Pain Intervention(s): Limited activity within patient's tolerance, Monitored during session, Repositioned  Home Living                                          Prior Functioning/Environment              Frequency  Min 2X/week        Progress Toward Goals  OT Goals(current goals can now be found in the care plan section)  Progress towards OT goals: Progressing toward goals  Acute Rehab OT Goals Patient Stated Goal: to go home OT Goal Formulation: With patient Time For Goal Achievement: 07/24/24 Potential to Achieve Goals: Good ADL Goals Pt Will Perform Grooming: with min assist;standing Pt Will Perform Lower Body Dressing: with min assist;sit to/from stand Pt Will Transfer to Toilet: with min assist;ambulating;regular height toilet  Plan      Co-evaluation    PT/OT/SLP Co-Evaluation/Treatment: Yes Reason for Co-Treatment: To address functional/ADL transfers;For patient/therapist safety PT goals addressed during session: Mobility/safety with mobility;Balance;Proper use of DME OT goals addressed during session: ADL's and self-care      AM-PAC OT 6 Clicks Daily Activity     Outcome Measure   Help from another person eating meals?: None Help from another person taking care of personal grooming?: A Little Help from another person toileting, which includes using toliet, bedpan, or urinal?: A Lot Help from another person bathing (including washing, rinsing, drying)?: A Lot Help from another person to put on and taking off regular upper body clothing?: A Little Help from another person to put on and taking off regular lower body clothing?: A Lot 6 Click Score: 16    End of Session Equipment Utilized During Treatment: Rolling walker (2 wheels);Oxygen  OT Visit Diagnosis: Other abnormalities of gait and mobility (R26.89);Muscle weakness (generalized) (M62.81)    Activity Tolerance Patient tolerated treatment well   Patient Left in chair;with call bell/phone within reach;with chair alarm set   Nurse Communication          Time: [id number] OT Time Calculation (min): 23 min  Charges: OT General Charges $OT Visit: 1 Visit OT Treatments $Self Care/Home Management : 8-22 mins  [place], Student OT   [employer] 07/10/2024, 12:05 PM

## 2024-07-10 NOTE — Progress Notes (Signed)
 Progress Note  Patient Name: Alison Richardson Date of Encounter: 07/10/2024  Primary Cardiologist: Perla  Subjective   No chest pain, dyspnea, palpitations, dizziness, presyncope, or syncope. Notes hip soreness. Hgb improved at 8.8 following 1 unit PRBC. BP stable, currently on digoxin  0.0625 mg daily and Lopressor  25 mg every 6 hours for rate control as well as midodrine  10 mg tid for BP support.   Inpatient Medications    Scheduled Meds:  vitamin C   500 mg Oral Daily   aspirin  EC  81 mg Oral Daily   Chlorhexidine  Gluconate Cloth  6 each Topical Daily   cyanocobalamin   1,000 mcg Intramuscular Q1200   Followed by   [name] ON 07/15/2024] vitamin B-12  1,000 mcg Oral Daily   digoxin   0.0625 mg Oral Daily   donepezil   10 mg Oral QHS   famotidine   20 mg Oral BID AC   feeding supplement (GLUCERNA SHAKE)  237 mL Oral TID BM   gabapentin   100 mg Oral QHS   insulin  aspart  0-5 Units Subcutaneous QHS   insulin  aspart  0-9 Units Subcutaneous TID WC   insulin  glargine-yfgn  10 Units Subcutaneous Daily   iron  polysaccharides  150 mg Oral Daily   levothyroxine   88 mcg Oral Q0600   lidocaine   1 patch Transdermal Q24H   metoprolol  tartrate  25 mg Oral Q6H   midodrine   10 mg Oral TID WC   multivitamin with minerals  1 tablet Oral Daily   polyethylene glycol  17 g Oral BID   pravastatin   20 mg Oral QHS   venlafaxine  XR  75 mg Oral Daily   Vitamin D  (Ergocalciferol )  50,000 Units Oral Q7 days   Continuous Infusions:  PRN Meds: acetaminophen , albuterol , bisacodyl , bisacodyl , dextromethorphan -guaiFENesin , hydrALAZINE , methocarbamol , metoprolol  tartrate, morphine  injection, nitroGLYCERIN , ondansetron  (ZOFRAN ) IV, oxyCODONE -acetaminophen , torsemide    Vital Signs    Vitals:   07/10/24 0300 07/10/24 0515 07/10/24 0801 07/10/24 0941  BP: 97/63 (!) 101/56 (!) 104/50 103/67  Pulse:   (!) 101 (!) 103  Resp:   19   Temp: 97.7 F (36.5 C)  98.6 F (37 C)   TempSrc: Oral     SpO2:   94%    Weight:      Height:        Intake/Output Summary (Last 24 hours) at 07/10/2024 1031 Last data filed at 07/10/2024 0940 Gross per 24 hour  Intake 1189.5 ml  Output 1915 ml  Net -725.5 ml   Filed Weights   07/04/24 1823 07/06/24 0000 07/07/24 0500  Weight: 72.6 kg 71.5 kg 73.9 kg    Telemetry    Afib/flutter, 80s to 90s bpm - Personally Reviewed  ECG    Atrial flutter with variable AV block, 93 bpm, improving QTc - Personally Reviewed  Physical Exam   GEN: No acute distress.   Neck: No JVD. Cardiac: IRIR, I/VI systolic murmur LUSB, no rubs, or gallops.  Respiratory: Clear to auscultation bilaterally.  GI: Soft, nontender, non-distended.   MS: No edema; No deformity. Neuro:  Alert and oriented x 3; Nonfocal.  Psych: Normal affect.  Labs    Chemistry Recent Labs  Lab 07/04/24 1827 07/05/24 0906 07/08/24 1006 07/09/24 0425 07/10/24 0500  NA 136   < > 136 137 140  K 3.8   < > 3.9 4.1 4.0  CL 103   < > 103 104 106  CO2 19*   < > 24 26 27   GLUCOSE 229*   < > 236*  151* 126*  BUN 18   < > 14 9 10   CREATININE 0.84   < > 0.58 0.58 0.52  CALCIUM  8.9   < > 8.4* 8.2* 8.4*  PROT 7.4  --   --   --   --   ALBUMIN 4.1  --   --   --   --   AST 32  --   --   --   --   ALT 26  --   --   --   --   ALKPHOS 78  --   --   --   --   BILITOT 0.6  --   --   --   --   GFRNONAA >60   < > >60 >60 >60  ANIONGAP 14   < > 9 7 7    < > = values in this interval not displayed.     Hematology Recent Labs  Lab 07/08/24 0459 07/08/24 1650 07/09/24 0425 07/09/24 2030 07/10/24 0500  WBC 12.4*  --  13.1*  --  12.0*  RBC 3.05*  --  3.10*  --  3.61*  HGB 7.5*   < > 7.5* 8.7* 8.8*  HCT 24.2*   < > 24.1* 28.0* 28.7*  MCV 79.3*  --  77.7*  --  79.5*  MCH 24.6*  --  24.2*  --  24.4*  MCHC 31.0  --  31.1  --  30.7  RDW 15.7*  --  15.8*  --  15.9*  PLT 275  --  319  --  361   < > = values in this interval not displayed.    Cardiac EnzymesNo results for input(s): TROPONINI in the  last 168 hours. No results for input(s): TROPIPOC in the last 168 hours.   BNP Recent Labs  Lab 07/04/24 1949  BNP 175.0*     DDimer No results for input(s): DDIMER in the last 168 hours.   Radiology    No results found.  Cardiac Studies   Limited echo 06/18/2024: 1. Left ventricular ejection fraction, by estimation, is 60 to 65%. The  left ventricle has normal function. The left ventricle has no regional  wall motion abnormalities. Left ventricular diastolic parameters were  normal.   2. Right ventricular systolic function is normal. The right ventricular  size is normal. There is mildly elevated pulmonary artery systolic  pressure. The estimated right ventricular systolic pressure is 43.2 mmHg.   3. The mitral valve is normal in structure. Mild to moderate mitral valve  regurgitation. No evidence of mitral stenosis.   4. Tricuspid valve regurgitation is moderate.   5. The aortic valve is tricuspid. Aortic valve regurgitation is not  visualized. No aortic stenosis is present.   6. The inferior vena cava is normal in size with greater than 50%  respiratory variability, suggesting right atrial pressure of 3 mmHg.  __________  [facility] 05/19/2024 Conclusions: Relatively stable appearance of severe two-vessel CAD, including chronic total occlusion of D1 branch and sequential 99% proximal, 40-50% mid, and 70-80% distal RCA stenoses.  There are also moderate, nonobstructive lesions in the ostial/proximal LAD, proximal/mid LCx, and RPDA/RPAV branches. Normal left and right heart filling pressures (mean RA 4, RV 40/5, PCWP 15, LVEDP 15 mmHg).  Prominent V waves noted on PCWP tracing suggest significant mitral regurgitation. Mild pulmonary hypertension (PA 36/15, mean 22 mmHg). Normal Fick cardiac output/index (CO 4.9 L/min, CI 2.7 L/min/m). Successful PCI to proximal and distal RCA stenoses using nonoverlapping Onyx Frontier 3.0  x 15 mm (proximal) and 2.75 x 15 mm (distal) drug-eluting  stents with 0% residual stenosis and TIMI-3 flow.   Recommendations: If no evidence of bleeding or vascular injury, resume apixaban  5 mg twice daily this evening.  Anticipate therapy with apixaban , clopidogrel , and aspirin  for 1 week, after which time aspirin  can be stopped.  Continue apixaban  and clopidogrel  therapy for up to 12 months, as tolerated (could switch clopidogrel  to aspirin  at 6 months if there is concern for high bleeding risk). Maintain net even fluid balance. Escalate goal-directed medical therapy as tolerated. Aggressive secondary prevention of coronary artery disease.  Patient Profile     89 y.o. female with history of CAD medically managed, persistent atrial fibrillation, HFpEF, valvular heart disease, type 2 diabetes, hypertension, hyperlipidemia, and hyperthyroidism, admitted 7/13 after mechanical fall causing L hip fracture, who is being seen for the ongoing management of persistent atrial fibrillation.  Assessment & Plan    1.  Persistent A-fib/flutter: -Remains in A-fib/flutter with reasonably controlled ventricular rates in the 80s to 90s bpm - PAF noted during admission in 04/2024 with amiodarone  discontinued for prolonged QT and concern for amiodarone  toxicity with improving QT this admission - During this admission she was back in A-fib with RVR and restarted on amiodarone  infusion for rate control, though this was again discontinued due to prolonged QT - Rate control has been challenging secondary to relative hypotension requiring initiation of midodrine  and subsequent titration up to 10 mg 3 times daily, will reduce midodrine  to 5 mg tid with plans to stop over the next day or so if possible -Consolidate Lopressor  to 50 mg bid - For now, she remains on digoxin , would like to discontinue this prior to discharge  - CHA2DS2-VASc at least 7 (CHF, HTN, age x 2, DM, vascular disease, sex category) - Apixaban  therapy interrupted in the perioperative timeframe and in the  context of acute blood loss anemia requiring PRBC transfusion - After discussion with internal medicine and orthopedics, we will resume apixaban  5 mg twice daily (does not meet reduced dosing criteria) with close monitoring of hemoglobin  2.  CAD with recent NSTEMI in 04/2024 status post successful PCI/DES to the proximal and distal RCA without angina: - No symptoms of angina or cardiac decompensation  - Clopidogrel  held in the setting of acute blood loss anemia in the postoperative setting requiring PRBC transfusion - Remains on aspirin  81 mg daily -Okay to resume clopidogrel  per orthopedics and internal medicine - Anticipate resuming clopidogrel  if possible over the next 24 to 48 hours, if she tolerates the resumption of apixaban  - Currently on pravastatin  20 mg  3. HFimpEF secondary to ICM: - Euvolemic - EF of 40 to 45% at time of NSTEMI in 04/2024, now normalized by echo in 05/2024 - GDMT including spironolactone  and losartan  has been held in the setting of persistent A-fib with RVR with challenging rate control and relative hypotension requiring midodrine  - Will reduce midodrine  to 5 mg tid with plans to taper off over the next if possible  4.  Mechanical fall with closed left hip fracture status post surgical repair complicated by acute blood loss anemia: - Apixaban  and clopidogrel  held in the setting of acute blood loss anemia requiring PRBC transfusion with hemoglobin improving from 7.5-8.8 over the past 24 hours -She reports intolerance to PPI with nausea and vomiting, currently on famotidine  - Management per IM and orthopedics      For questions or updates, please contact [facility] Please consult [email or web address] for  contact info under Cardiology/STEMI.    Signed, Bernardino Bring, PA-C [facility] Pager: [phone] 07/10/2024, 10:31 AM

## 2024-07-11 DIAGNOSIS — I5030 Unspecified diastolic (congestive) heart failure: Secondary | ICD-10-CM | POA: Diagnosis not present

## 2024-07-11 DIAGNOSIS — S72002A Fracture of unspecified part of neck of left femur, initial encounter for closed fracture: Secondary | ICD-10-CM | POA: Diagnosis not present

## 2024-07-11 DIAGNOSIS — I2510 Atherosclerotic heart disease of native coronary artery without angina pectoris: Secondary | ICD-10-CM | POA: Diagnosis not present

## 2024-07-11 DIAGNOSIS — I4892 Unspecified atrial flutter: Secondary | ICD-10-CM

## 2024-07-11 LAB — BASIC METABOLIC PANEL WITH GFR
Anion gap: 7 (ref 5–15)
BUN: 10 mg/dL (ref 8–23)
CO2: 29 mmol/L (ref 22–32)
Calcium: 8.3 mg/dL — ABNORMAL LOW (ref 8.9–10.3)
Chloride: 102 mmol/L (ref 98–111)
Creatinine, Ser: 0.55 mg/dL (ref 0.44–1.00)
GFR, Estimated: >60 mL/min (ref >60)
Glucose, Bld: 139 mg/dL — ABNORMAL HIGH (ref 70–99)
Potassium: 4.1 mmol/L (ref 3.5–5.1)
Sodium: 138 mmol/L (ref 135–145)

## 2024-07-11 LAB — CBC
HCT: 30.2 % — ABNORMAL LOW (ref 36.0–46.0)
Hemoglobin: 9.2 g/dL — ABNORMAL LOW (ref 12.0–15.0)
MCH: 24.2 pg — ABNORMAL LOW (ref 26.0–34.0)
MCHC: 30.5 g/dL (ref 30.0–36.0)
MCV: 79.5 fL — ABNORMAL LOW (ref 80.0–100.0)
Platelets: 364 K/uL (ref 150–400)
RBC: 3.8 MIL/uL — ABNORMAL LOW (ref 3.87–5.11)
RDW: 16.3 % — ABNORMAL HIGH (ref 11.5–15.5)
WBC: 12.4 K/uL — ABNORMAL HIGH (ref 4.0–10.5)
nRBC: 0.7 % — ABNORMAL HIGH (ref 0.0–0.2)

## 2024-07-11 LAB — MAGNESIUM: Magnesium: 2 mg/dL (ref 1.7–2.4)

## 2024-07-11 LAB — GLUCOSE, CAPILLARY
Glucose-Capillary: 158 mg/dL — ABNORMAL HIGH (ref 70–99)
Glucose-Capillary: 170 mg/dL — ABNORMAL HIGH (ref 70–99)
Glucose-Capillary: 152 mg/dL — ABNORMAL HIGH (ref 70–99)
Glucose-Capillary: 148 mg/dL — ABNORMAL HIGH (ref 70–99)

## 2024-07-11 LAB — PHOSPHORUS: Phosphorus: 3.6 mg/dL (ref 2.5–4.6)

## 2024-07-11 NOTE — Progress Notes (Signed)
 Subjective: 5 Days Post-Op Procedure(s) (LRB): OPEN REDUCTION INTERNAL FIXATION HIP (Left) Patient reports pain as mild to moderate.  Drainage noted yesterday, new dressing applied.  No saturation of the dressing today.  Patient is well, and has had no acute complaints or problems Plan is to go Rehab after hospital stay.  She will return back to [place]. Positive bowel movement  Objective: Vital signs in last 24 hours: Temp:  [97.9 F (36.6 C)-99 F (37.2 C)] 98.1 F (36.7 C) (07/19 0729) Pulse Rate:  [84-117] 98 (07/19 0729) Resp:  [14-22] 16 (07/19 0729) BP: (103-120)/(53-78) 104/70 (07/19 0729) SpO2:  [88 %-97 %] 96 % (07/19 0729)  Intake/Output from previous day:  Intake/Output Summary (Last 24 hours) at 07/11/2024 0844 Last data filed at 07/11/2024 0544 Gross per 24 hour  Intake 1157 ml  Output 1785 ml  Net -628 ml    Intake/Output this shift: No intake/output data recorded.  Labs: Recent Labs    07/08/24 1650 07/09/24 0425 07/09/24 2030 07/10/24 0500 07/11/24 0601  HGB 7.8* 7.5* 8.7* 8.8* 9.2*   Recent Labs    07/10/24 0500 07/11/24 0601  WBC 12.0* 12.4*  RBC 3.61* 3.80*  HCT 28.7* 30.2*  PLT 361 364   Recent Labs    07/10/24 0500 07/11/24 0601  NA 140 138  K 4.0 4.1  CL 106 102  CO2 27 29  BUN 10 10  CREATININE 0.52 0.55  GLUCOSE 126* 139*  CALCIUM  8.4* 8.3*   No results for input(s): LABPT, INR in the last 72 hours.    EXAM General - Patient is Alert and Oriented Left lower extremity - Neurovascular intact Sensation intact distally Dorsiflexion/Plantar flexion intact Compartment soft Dressing/Incision - clean, dry, no drainage.  ABD pads intact with paper tape.  Dressing removed, no drainage noted.  Staples are intact. Motor Function - intact, moving foot and toes well on exam.    Past Medical History:  Diagnosis Date   Acute on chronic congestive heart failure (HCC) 10/07/2020   Arrhythmia    Atrial fibrillation and  flutter (HCC) 01/06/2019   Plavix  added to eliquis  10-22 after nstemi       Last Assessment & Plan:    Marked facial hematoma post fall but ct of neck and head reportedly normal. No ha or ataxia now but is using a walker. On eliquis  and plavix      Atrial flutter (HCC) 01/2018   new onset    Basal cell carcinoma of back    Basal cell carcinoma of lip    Cerebral aneurysm    followed by [facility]   CHF (congestive heart failure) (HCC)    DDD (degenerative disc disease), lumbar    superior plate depression, L3 08/18/2014   Diabetes mellitus type II, controlled (HCC)    Diverticulosis    Dysrhythmia    Paroxysmal Supraventricular Tachycardia   Dysthymia    depression   GERD (gastroesophageal reflux disease)    History of falling 04/03/2022   History of meniscal tear    Humeral surgical neck fracture 05/16/2022   Hyperlipidemia    Hypertension    Hypokalemia 12/12/2022   Hypothyroidism    Late onset Alzheimer's disease with behavioral disturbance (HCC)    Leukocytosis 10/07/2020   Mild pulmonary hypertension (HCC)    Moderate asthma without complication 12/12/2022   Multifocal pneumonia 12/09/2022   Overactive bladder    Peripheral vascular disease (HCC)    Puncture wound of forehead 06/11/2023   Seasonal allergic rhinitis  Severe sepsis (HCC) 02/07/2022   Sleep apnea     Assessment/Plan: 5 Days Post-Op Procedure(s) (LRB): OPEN REDUCTION INTERNAL FIXATION HIP (Left) Principal Problem:   Closed left hip fracture (HCC) Active Problems:   Hypothyroidism   Type 2 diabetes mellitus with peripheral neuropathy (HCC)   Hyperlipidemia associated with type 2 diabetes mellitus (HCC)   Leukocytosis   Chronic diastolic CHF (congestive heart failure) (HCC)   Cor pulmonale, chronic (HCC)   Late onset Alzheimer's disease without behavioral disturbance (HCC)   Depression with anxiety   CAD (coronary artery disease)   Chronic atrial fibrillation with RVR (HCC)   Asthma   Fall at home,  initial encounter   Nausea & vomiting   Paroxysmal atrial fibrillation (HCC)   Preop cardiovascular exam   Longstanding persistent atrial fibrillation (HCC)   Fall   Hypotension  Estimated body mass index is 26.3 kg/m as calculated from the following:   Height as of this encounter: 5' 6 (1.676 m).   Weight as of this encounter: 73.9 kg. Advance diet Up with therapy Pain slowly improving. Vital signs are stable Labs are stable, hemoglobin up to 9.2 Care management to assist with discharge to skilled nursing facility.  Discharge planning: Follow-up with [facility] orthopedics in 2 weeks for staple removal and x-rays of the left hip.   DVT Prophylaxis - TED hose and SCDs, Eliquis  Weight-Bearing as tolerated to left leg  T. Medford Amber, PA-C Orthopaedic Surgery 07/11/2024, 8:44 AM

## 2024-07-11 NOTE — Plan of Care (Signed)

## 2024-07-11 NOTE — Progress Notes (Signed)
 Progress Note  Patient Name: Alison Richardson Date of Encounter: 07/11/2024  Primary Cardiologist: Gollan  Subjective   Restarted on Eliquis  on 7/18 with stable Hgb trend 8.8 (following 1 unit PRBC on 7/17) to 9.2 this morning. Feels better this morning. No chest pain, dyspnea, palpitations, dizziness, presyncope, or syncope.   Inpatient Medications    Scheduled Meds:  apixaban   5 mg Oral BID   vitamin C   500 mg Oral Daily   aspirin  EC  81 mg Oral Daily   Chlorhexidine  Gluconate Cloth  6 each Topical Daily   cyanocobalamin   1,000 mcg Intramuscular Q1200   Followed by   [name] ON 07/15/2024] vitamin B-12  1,000 mcg Oral Daily   digoxin   0.0625 mg Oral Daily   donepezil   10 mg Oral QHS   famotidine   20 mg Oral BID AC   feeding supplement (GLUCERNA SHAKE)  237 mL Oral TID BM   gabapentin   100 mg Oral QHS   insulin  aspart  0-5 Units Subcutaneous QHS   insulin  aspart  0-9 Units Subcutaneous TID WC   insulin  glargine-yfgn  10 Units Subcutaneous Daily   iron  polysaccharides  150 mg Oral Daily   levothyroxine   88 mcg Oral Q0600   lidocaine   1 patch Transdermal Q24H   metoprolol  tartrate  50 mg Oral BID   midodrine   5 mg Oral TID WC   multivitamin with minerals  1 tablet Oral Daily   polyethylene glycol  17 g Oral BID   pravastatin   20 mg Oral QHS   venlafaxine  XR  75 mg Oral Daily   Vitamin D  (Ergocalciferol )  50,000 Units Oral Q7 days   Continuous Infusions:  PRN Meds: acetaminophen , albuterol , bisacodyl , bisacodyl , dextromethorphan -guaiFENesin , hydrALAZINE , methocarbamol , metoprolol  tartrate, morphine  injection, nitroGLYCERIN , ondansetron  (ZOFRAN ) IV, oxyCODONE -acetaminophen , torsemide    Vital Signs    Vitals:   07/10/24 2054 07/11/24 0000 07/11/24 0344 07/11/24 0729  BP: 116/76 113/66 107/67 104/70  Pulse: (!) 117 97  98  Resp:  18 17 16   Temp:  97.9 F (36.6 C) 99 F (37.2 C) 98.1 F (36.7 C)  TempSrc:  Oral Oral   SpO2:  95%  96%  Weight:      Height:         Intake/Output Summary (Last 24 hours) at 07/11/2024 0752 Last data filed at 07/11/2024 0544 Gross per 24 hour  Intake 1157 ml  Output 1785 ml  Net -628 ml   Filed Weights   07/04/24 1823 07/06/24 0000 07/07/24 0500  Weight: 72.6 kg 71.5 kg 73.9 kg    Telemetry    Afib/flutter, 80s to 90s bpm with rare runs into the low 100s bpm - Personally Reviewed  ECG    Atrial flutter with variable AV block, 94 bpm, stable QTc - Personally Reviewed  Physical Exam   GEN: No acute distress.   Neck: No JVD. Cardiac: IRIR, I/VI systolic murmur LUSB, no rubs, or gallops.  Respiratory: Clear to auscultation bilaterally.  GI: Soft, nontender, non-distended.   MS: No edema; No deformity. Neuro:  Alert and oriented x 3; Nonfocal.  Psych: Normal affect.  Labs    Chemistry Recent Labs  Lab 07/04/24 1827 07/05/24 0906 07/09/24 0425 07/10/24 0500 07/11/24 0601  NA 136   < > 137 140 138  K 3.8   < > 4.1 4.0 4.1  CL 103   < > 104 106 102  CO2 19*   < > 26 27 29   GLUCOSE 229*   < >  151* 126* 139*  BUN 18   < > 9 10 10   CREATININE 0.84   < > 0.58 0.52 0.55  CALCIUM  8.9   < > 8.2* 8.4* 8.3*  PROT 7.4  --   --   --   --   ALBUMIN 4.1  --   --   --   --   AST 32  --   --   --   --   ALT 26  --   --   --   --   ALKPHOS 78  --   --   --   --   BILITOT 0.6  --   --   --   --   GFRNONAA >60   < > >60 >60 >60  ANIONGAP 14   < > 7 7 7    < > = values in this interval not displayed.     Hematology Recent Labs  Lab 07/09/24 0425 07/09/24 2030 07/10/24 0500 07/11/24 0601  WBC 13.1*  --  12.0* 12.4*  RBC 3.10*  --  3.61* 3.80*  HGB 7.5* 8.7* 8.8* 9.2*  HCT 24.1* 28.0* 28.7* 30.2*  MCV 77.7*  --  79.5* 79.5*  MCH 24.2*  --  24.4* 24.2*  MCHC 31.1  --  30.7 30.5  RDW 15.8*  --  15.9* 16.3*  PLT 319  --  361 364    Cardiac EnzymesNo results for input(s): TROPONINI in the last 168 hours. No results for input(s): TROPIPOC in the last 168 hours.   BNP Recent Labs  Lab  07/04/24 1949  BNP 175.0*     DDimer No results for input(s): DDIMER in the last 168 hours.   Radiology    No results found.  Cardiac Studies   Limited echo 06/18/2024: 1. Left ventricular ejection fraction, by estimation, is 60 to 65%. The  left ventricle has normal function. The left ventricle has no regional  wall motion abnormalities. Left ventricular diastolic parameters were  normal.   2. Right ventricular systolic function is normal. The right ventricular  size is normal. There is mildly elevated pulmonary artery systolic  pressure. The estimated right ventricular systolic pressure is 43.2 mmHg.   3. The mitral valve is normal in structure. Mild to moderate mitral valve  regurgitation. No evidence of mitral stenosis.   4. Tricuspid valve regurgitation is moderate.   5. The aortic valve is tricuspid. Aortic valve regurgitation is not  visualized. No aortic stenosis is present.   6. The inferior vena cava is normal in size with greater than 50%  respiratory variability, suggesting right atrial pressure of 3 mmHg.  __________  [facility] 05/19/2024 Conclusions: Relatively stable appearance of severe two-vessel CAD, including chronic total occlusion of D1 branch and sequential 99% proximal, 40-50% mid, and 70-80% distal RCA stenoses.  There are also moderate, nonobstructive lesions in the ostial/proximal LAD, proximal/mid LCx, and RPDA/RPAV branches. Normal left and right heart filling pressures (mean RA 4, RV 40/5, PCWP 15, LVEDP 15 mmHg).  Prominent V waves noted on PCWP tracing suggest significant mitral regurgitation. Mild pulmonary hypertension (PA 36/15, mean 22 mmHg). Normal Fick cardiac output/index (CO 4.9 L/min, CI 2.7 L/min/m). Successful PCI to proximal and distal RCA stenoses using nonoverlapping Onyx Frontier 3.0 x 15 mm (proximal) and 2.75 x 15 mm (distal) drug-eluting stents with 0% residual stenosis and TIMI-3 flow.   Recommendations: If no evidence of bleeding or  vascular injury, resume apixaban  5 mg twice daily this evening.  Anticipate  therapy with apixaban , clopidogrel , and aspirin  for 1 week, after which time aspirin  can be stopped.  Continue apixaban  and clopidogrel  therapy for up to 12 months, as tolerated (could switch clopidogrel  to aspirin  at 6 months if there is concern for high bleeding risk). Maintain net even fluid balance. Escalate goal-directed medical therapy as tolerated. Aggressive secondary prevention of coronary artery disease.  Patient Profile     89 y.o. female with history of CAD medically managed, persistent atrial fibrillation, HFpEF, valvular heart disease, type 2 diabetes, hypertension, hyperlipidemia, and hyperthyroidism, admitted 7/13 after mechanical fall causing L hip fracture, who is being seen for the ongoing management of persistent atrial fibrillation.  Assessment & Plan    1.  Persistent A-fib/flutter: -Remains in A-fib/flutter with reasonably controlled ventricular rates in the 80s to 90s bpm - PAF noted during admission in 04/2024 with amiodarone  discontinued for prolonged QT and concern for amiodarone  toxicity with improving QT this admission - During this admission she was back in A-fib with RVR and restarted on amiodarone  infusion for rate control, though this was again discontinued due to prolonged QT - Rate control has been challenging secondary to relative hypotension requiring initiation of midodrine  and subsequent titration up to 10 mg 3 times daily, now reduced to 5 mg tid with plans to stop over the next day or so if possible -Continue Lopressor  to 50 mg bid - For now, she remains on digoxin , would like to discontinue this prior to discharge  - CHA2DS2-VASc at least 7 (CHF, HTN, age x 2, DM, vascular disease, sex category) - Apixaban  therapy interrupted in the perioperative timeframe and in the context of acute blood loss anemia requiring PRBC transfusion - After discussion with internal medicine and  orthopedics, this was resumed on 7/18 with stable Hgb on follow up labs 71/9 (does not meet reduced dosing criteria)  - Close monitoring of hemoglobin  2.  CAD with recent NSTEMI in 04/2024 status post successful PCI/DES to the proximal and distal RCA without angina: - No symptoms of angina or cardiac decompensation  - Clopidogrel  held in the setting of acute blood loss anemia in the postoperative setting requiring PRBC transfusion - Remains on aspirin  81 mg daily -Okay to resume clopidogrel  per orthopedics and internal medicine - Anticipate resuming clopidogrel  if possible over the next 24 to 48 hours, if she continues to tolerate the resumption of apixaban  - Currently on pravastatin  20 mg  3. HFimpEF secondary to ICM: - Euvolemic - EF of 40 to 45% at time of NSTEMI in 04/2024, now normalized by echo in 05/2024 - GDMT including spironolactone  and losartan  has been held in the setting of persistent A-fib with RVR with challenging rate control and relative hypotension requiring midodrine  - Now on lower dose midodrine  5 mg tid with plans to taper off over the next if possible  4.  Mechanical fall with closed left hip fracture status post surgical repair complicated by acute blood loss anemia: - Apixaban  and clopidogrel  held in the setting of acute blood loss anemia requiring PRBC transfusion with hemoglobin improving from 7.5-8.8-9.2 over the past 48 hours -She reports intolerance to PPI with nausea and vomiting, currently on famotidine  - Management per IM and orthopedics      For questions or updates, please contact [facility] Please consult [email or web address] for contact info under Cardiology/STEMI.    Signed, Bernardino Bring, PA-C [facility] Pager: [phone] 07/11/2024, 7:52 AM

## 2024-07-11 NOTE — Progress Notes (Signed)
 Physical Therapy Treatment Patient Details Name: Alison Richardson MRN: [id number] DOB: Aug 16, 1933 Today's Date: 07/11/2024   History of Present Illness Alison Richardson is a 89 y.o. female with medical history significant of CAD, recent NSTEMI with stent placement, dCHF, chronic cor pulmonale, A fib on Eliquis , HTN, HDL, DM, asthma, PVD, hypothyroidism, dementia, depression with anxiety, GERD, who presents with fall and left hip pain.  She sustained a left hip fracture, s/p L hip ORIF on 07/17/24, WBAT    PT Comments  Pt made progress today performing mobility demonstrating less pain and greater ability to initiate and sustain activity with a task.  During standing  EOB and tranferring to recliner (Min A +2 for safety with lines/leads and pt's anxiety) with RW, pt able to lift RLE from ground today but still remains very challenging to take a true step. Reviewed what WBAT is and importance of shifting weight to take a step. Pt seems more to be anxious anticipating pain vs. actually having 10/10 pain.  Continue to recommend post acute rehab <3 hours therapy/day upon d/c.    If plan is discharge home, recommend the following: A lot of help with bathing/dressing/bathroom;Assistance with feeding;Assistance with cooking/housework;Assist for transportation;Help with stairs or ramp for entrance;A lot of help with walking and/or transfers   Can travel by private vehicle     No  Equipment Recommendations  Other (comment) (TBD)    Recommendations for Other Services       Precautions / Restrictions Precautions Precautions: Fall Recall of Precautions/Restrictions: Impaired Restrictions Weight Bearing Restrictions Per Provider Order: No LLE Weight Bearing Per Provider Order: Weight bearing as tolerated     Mobility  Bed Mobility Overal bed mobility: Needs Assistance Bed Mobility: Supine to Sit     Supine to sit: Max assist     General bed mobility comments: cues for sequence     Transfers Overall transfer level: Needs assistance Equipment used: Rolling walker (2 wheels) Transfers: Sit to/from Stand, Bed to chair/wheelchair/BSC Sit to Stand: Min assist, +2 safety/equipment   Step pivot transfers: Min assist, +2 safety/equipment       General transfer comment: able to lift RLE from ground today but still remains very challenging to take a true step.  Reviewed what WBAT is and importance of shifting weight to take a step.  Pt seems more to be anxious about having pain vs. actually having 10/10 pain.    Ambulation/Gait Ambulation/Gait assistance: Min assist, +2 safety/equipment Gait Distance (Feet): 3 Feet Assistive device: Rolling walker (2 wheels) Gait Pattern/deviations: Decreased step length - right, Decreased step length - left, Decreased stride length       General Gait Details: Pt takes side steps to R bed> recliner with RW & cuing re: technique. Pt with decreased R foot clearance, scoots it rather than steps. Decreased weight shift to LLE 2/2 anxiety/ pain.   Stairs             Wheelchair Mobility     Tilt Bed    Modified Rankin (Stroke Patients Only)       Balance Overall balance assessment: Needs assistance Sitting-balance support: No upper extremity supported, Feet supported Sitting balance-Leahy Scale: Good Sitting balance - Comments: supervision static sitting EOB   Standing balance support: Bilateral upper extremity supported, Reliant on assistive device for balance, During functional activity Standing balance-Leahy Scale: Fair Standing balance comment: able to statically stand with BUE support, but minA for dynamic tasks, step pivot  Communication Communication Communication: No apparent difficulties Factors Affecting Communication: Hearing impaired  Cognition Arousal: Alert Behavior During Therapy: WFL for tasks assessed/performed   PT - Cognitive impairments: History of  cognitive impairments                       PT - Cognition Comments: oriented to self, place Following commands: Impaired Following commands impaired: Follows one step commands with increased time    Cueing Cueing Techniques: Verbal cues, Gestural cues, Tactile cues  Exercises      General Comments        Pertinent Vitals/Pain Pain Assessment Pain Score: 10-Worst pain ever Pain Location: L hip with movement, weight bearing Pain Descriptors / Indicators: Discomfort, Grimacing Pain Intervention(s): Monitored during session, Limited activity within patient's tolerance    Home Living                          Prior Function            PT Goals (current goals can now be found in the care plan section) Acute Rehab PT Goals Patient Stated Goal: to go home PT Goal Formulation: With patient Time For Goal Achievement: 07/21/24 Potential to Achieve Goals: Fair Progress towards PT goals: Progressing toward goals    Frequency    7X/week      PT Plan      Co-evaluation              AM-PAC PT 6 Clicks Mobility   Outcome Measure  Help needed turning from your back to your side while in a flat bed without using bedrails?: A Lot Help needed moving from lying on your back to sitting on the side of a flat bed without using bedrails?: A Lot Help needed moving to and from a bed to a chair (including a wheelchair)?: A Lot Help needed standing up from a chair using your arms (e.g., wheelchair or bedside chair)?: A Little Help needed to walk in hospital room?: A Lot Help needed climbing 3-5 steps with a railing? : Total 6 Click Score: 12    End of Session Equipment Utilized During Treatment: Gait belt;Oxygen Activity Tolerance: Patient limited by pain Patient left: in chair;with call bell/phone within reach;with chair alarm set Nurse Communication: Mobility status PT Visit Diagnosis: Other abnormalities of gait and mobility (R26.89);Difficulty in  walking, not elsewhere classified (R26.2);Muscle weakness (generalized) (M62.81);Pain Pain - Right/Left: Left Pain - part of body: Hip     Time: 8664-8643 PT Time Calculation (min) (ACUTE ONLY): 21 min  Charges:    $Therapeutic Activity: 8-22 mins PT General Charges $$ ACUTE PT VISIT: 1 Visit                     Harland Irving, PTA  07/11/24, 2:15 PM

## 2024-07-11 NOTE — Progress Notes (Signed)
 Progress Note   Patient: Alison Richardson FMW:[id number] DOB: [birth year] DOA: 07/04/2024     7 DOS: the patient was seen and examined on 07/11/2024   Brief hospital course: Janila Arrazola is a 89 y.o. female with medical history significant of CAD, recent NSTEMI with stent placement, dCHF, chronic cor pulmonale, A fib on Eliquis , HTN, HDL, DM, asthma, PVD, hypothyroidism, dementia, depression with anxiety, GERD, who presents with fall and left hip pain.  She sustained a left hip fracture, seen by orthopedic surgery, status post ORIF 7/14. Patient also had coronary stents placed 2 months ago, he is seen by cardiology to clear for surgery   Principal Problem:   Closed left hip fracture [facility]) Active Problems:   Chronic atrial fibrillation with RVR (HCC)   Fall at home, initial encounter   CAD (coronary artery disease)   Chronic diastolic CHF (congestive heart failure) (HCC)   Cor pulmonale, chronic (HCC)   Type 2 diabetes mellitus with peripheral neuropathy (HCC)   Hyperlipidemia associated with type 2 diabetes mellitus (HCC)   Hypothyroidism   Asthma   Nausea & vomiting   Late onset Alzheimer's disease without behavioral disturbance (HCC)   Depression with anxiety   Leukocytosis   Paroxysmal atrial fibrillation (HCC)   Preop cardiovascular exam   Longstanding persistent atrial fibrillation (HCC)   Fall   Hypotension   Assessment and Plan:  # Closed left hip fracture # Fall at home, initial encounter  X-ray showed acute comminuted intertrochanteric fracture of the left femur.  She did not have syncope. Cardiology has cleared her for surgery, patient had ORIF 7/14. Leukocytosis, most likely reactive,  No Evidence of infection.   Check CBC daily  Acute hypoxemia post op. .  Patient had a significant hypoxemia after surgery did not have short of breath.  Currently on 4 L oxygen.  This is likely due to sedation.  Continue to follow, wean oxygen   Chronic atrial fibrillation with  RVR:  -Digoxin  0.0625 mg p.o. daily 7/15 - Metoprolol  25 mg p.o. every 6 hours 7/13 - Discontinued amiodarone  on 7/17 due to prolonged QTc - Midodrine  10 mg p.o. 3 times daily started on 7/16 - Hold off anticoagulation due to anemia - PRBC transfusion to keep hemoglobin >8.0 - Eliquis  5 mg p.o. twice daily started on 7/18   CAD (coronary artery disease):  Patient has stent placement. Continue aspirin , and resumed Eliquis  on 7/18 hold off Plavix  for now Cardiology following. 7/19 plan is to change aspirin  to Plavix  tomorrow if H&H remains stable on Eliquis      Chronic diastolic CHF and Cor pulmonale, chronic : 2D echo on 06/18/2024 showed EF 60 to 65%.  Patient does not have leg edema or JVD.  No SOB.   Still euvolemic.  Restart diuretics.    Hypophosphatemia: Phos repleted. Monitor electrolytes and replete as needed  Anemia secondary to iron  deficiency and low B12 level 7/17 Hb 7.5, transfuse 1 unit of PRBC to keep Hb >8.0 7/18 Hb 8.8, posttransfusion Check CBC daily  Iron  deficiency, Tsat 3%, Venofer  300 mg IV x 2 doses 7/16, & 7/17 start oral supplement with vitamin C  Follow-up with PCP to repeat iron  profile after 3 to 20-month  Vitamin D  deficiency: started vitamin D  50,000 units p.o. weekly, follow with PCP to repeat vitamin D  level after 3 to 6 months.  Vitamin B12 level 210, goal >400: Started vitamin B12 1000 mcg IM injection daily during hospital stay, followed by oral supplement.  Follow-up PCP to repeat  vitamin B12 level after 3 to 6 months.   Type 2 diabetes mellitus with peripheral neuropathy [facility]): Recent A1c 8.2, poorly controlled.  Patient is taking Ozempic  Continue sliding scale insulin , glucose running high, added long-acting insulin .   Hyperlipidemia associated with type 2 diabetes mellitus (HCC) -Pravastatin    Hypothyroidism:  Synthroid    Nausea, vomiting: Resolved.   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 -F/u with PCP  Constipation:  -MiraLAX  twice daily -Dulcolax 10 mg p.o. nightly from 7/17 - Dulcolax suppository one-time dose on 7/16      Subjective:  No significant events overnight, patient was resting calmly, no pain in the left thigh at rest.  As per patient pain increases while walking which is expected.  Denied any chest pain or palpitation, no shortness of breath.  No any other complaints Patient stated that she would like to go to SNF at [facility].   Physical Exam: Vitals:   07/11/24 1008 07/11/24 1100 07/11/24 1200 07/11/24 1400  BP:  111/70    Pulse: (!) 106 91 94 (!) 107  Resp:  14 14   Temp:  98.3 F (36.8 C)    TempSrc:      SpO2:  97% 97% 98%  Weight:      Height:       General exam: Appears calm and comfortable  Respiratory system: Clear to auscultation. Respiratory effort normal. Cardiovascular system: S1 & S2 heard, RRR. No JVD, murmurs, rubs, gallops or clicks. No pedal edema. Gastrointestinal system: Abdomen is nondistended, soft and nontender. No organomegaly or masses felt. Normal bowel sounds heard. Central nervous system: Alert and oriented x3. No focal neurological deficits. Extremities: Symmetric 5 x 5 power. Skin: No rashes, lesions or ulcers Psychiatry: Judgement and insight appear normal. Mood & affect appropriate.    Data Reviewed:  Results reviewed.  Family Communication: None  Disposition: Status is: Inpatient Remains inpatient appropriate because: Severity of disease, unsafe discharge. DC plan to SNF, follow TOC for placement Patient would like to go to SNF at the  [facility] [place].      Time spent: 40  minutes  Author: Elvan Sor, MD 07/11/2024 2:10 PM  For on call review [email or web address].

## 2024-07-12 DIAGNOSIS — I2510 Atherosclerotic heart disease of native coronary artery without angina pectoris: Secondary | ICD-10-CM | POA: Diagnosis not present

## 2024-07-12 DIAGNOSIS — I4892 Unspecified atrial flutter: Secondary | ICD-10-CM | POA: Diagnosis not present

## 2024-07-12 DIAGNOSIS — S72002A Fracture of unspecified part of neck of left femur, initial encounter for closed fracture: Secondary | ICD-10-CM | POA: Diagnosis not present

## 2024-07-12 DIAGNOSIS — I5030 Unspecified diastolic (congestive) heart failure: Secondary | ICD-10-CM | POA: Diagnosis not present

## 2024-07-12 LAB — BASIC METABOLIC PANEL WITH GFR
Anion gap: 8 (ref 5–15)
BUN: 10 mg/dL (ref 8–23)
CO2: 27 mmol/L (ref 22–32)
Calcium: 8.6 mg/dL — ABNORMAL LOW (ref 8.9–10.3)
Chloride: 102 mmol/L (ref 98–111)
Creatinine, Ser: 0.55 mg/dL (ref 0.44–1.00)
GFR, Estimated: >60 mL/min (ref >60)
Glucose, Bld: 148 mg/dL — ABNORMAL HIGH (ref 70–99)
Potassium: 4.2 mmol/L (ref 3.5–5.1)
Sodium: 137 mmol/L (ref 135–145)

## 2024-07-12 LAB — CBC
HCT: 29.4 % — ABNORMAL LOW (ref 36.0–46.0)
Hemoglobin: 9 g/dL — ABNORMAL LOW (ref 12.0–15.0)
MCH: 24.8 pg — ABNORMAL LOW (ref 26.0–34.0)
MCHC: 30.6 g/dL (ref 30.0–36.0)
MCV: 81 fL (ref 80.0–100.0)
Platelets: 387 K/uL (ref 150–400)
RBC: 3.63 MIL/uL — ABNORMAL LOW (ref 3.87–5.11)
RDW: 17 % — ABNORMAL HIGH (ref 11.5–15.5)
WBC: 11.1 K/uL — ABNORMAL HIGH (ref 4.0–10.5)
nRBC: 0.2 % (ref 0.0–0.2)

## 2024-07-12 LAB — GLUCOSE, CAPILLARY
Glucose-Capillary: 158 mg/dL — ABNORMAL HIGH (ref 70–99)
Glucose-Capillary: 151 mg/dL — ABNORMAL HIGH (ref 70–99)
Glucose-Capillary: 109 mg/dL — ABNORMAL HIGH (ref 70–99)
Glucose-Capillary: 136 mg/dL — ABNORMAL HIGH (ref 70–99)

## 2024-07-12 MED ORDER — HYDROXYZINE HCL 25 MG PO TABS
25.0000 mg | ORAL_TABLET | Freq: Three times a day (TID) | ORAL | Status: DC | PRN
  Administered 2024-07-12: 25 mg via ORAL
  Filled 2024-07-12 (×2): qty 1

## 2024-07-12 MED ORDER — CLOPIDOGREL BISULFATE 75 MG PO TABS
75.0000 mg | ORAL_TABLET | Freq: Every day | ORAL | Status: DC
  Administered 2024-07-12 – 2024-07-14 (×3): 75 mg via ORAL
  Filled 2024-07-12 (×3): qty 1

## 2024-07-12 NOTE — Plan of Care (Signed)

## 2024-07-12 NOTE — Progress Notes (Signed)
 Pt voided approximately 400cc during shift, and had two large unmeasureable occurrences. MD Von made aware. Okay for this RN to place external catheter d/t episodes of incontinence. Directed to obtain PVR with bladder scan at pt's next void. Pt educated on letting staff know prior to voiding, so that bladder scan may be appropriately timed and obtained. Pt verbalized understanding.

## 2024-07-12 NOTE — Progress Notes (Signed)
 Progress Note   Patient: Alison Richardson FMW:[id number] DOB: 08-28-1933 DOA: 07/04/2024     8 DOS: the patient was seen and examined on 07/12/2024   Brief hospital course: Madesyn Ast is a 89 y.o. female with medical history significant of CAD, recent NSTEMI with stent placement, dCHF, chronic cor pulmonale, A fib on Eliquis , HTN, HDL, DM, asthma, PVD, hypothyroidism, dementia, depression with anxiety, GERD, who presents with fall and left hip pain.  She sustained a left hip fracture, seen by orthopedic surgery, status post ORIF 7/14. Patient also had coronary stents placed 2 months ago, he is seen by cardiology to clear for surgery   Principal Problem:   Closed left hip fracture [facility]) Active Problems:   Chronic atrial fibrillation with RVR (HCC)   Fall at home, initial encounter   CAD (coronary artery disease)   Chronic diastolic CHF (congestive heart failure) (HCC)   Cor pulmonale, chronic (HCC)   Type 2 diabetes mellitus with peripheral neuropathy (HCC)   Hyperlipidemia associated with type 2 diabetes mellitus (HCC)   Hypothyroidism   Asthma   Nausea & vomiting   Late onset Alzheimer's disease without behavioral disturbance (HCC)   Depression with anxiety   Leukocytosis   Paroxysmal atrial fibrillation (HCC)   Preop cardiovascular exam   Longstanding persistent atrial fibrillation (HCC)   Fall   Hypotension   Assessment and Plan:  # Closed left hip fracture # Fall at home, initial encounter  X-ray showed acute comminuted intertrochanteric fracture of the left femur. She did not have syncope. Cardiology has cleared her for surgery, patient had ORIF 7/14. Leukocytosis, most likely reactive,  No Evidence of infection.   Check CBC daily  # Acute hypoxemia post op. Resolved on RA Patient had a significant hypoxemia after surgery did not have short of breath, most likely due to sedation. S/p supplemental O2 inhalation, which has been gradually weaned off.      # Chronic  atrial fibrillation with RVR:  -Digoxin  0.0625 mg p.o. daily 7/15 - Metoprolol  25 mg p.o. every 6 hours 7/13 - Discontinued amiodarone  on 7/17 due to prolonged QTc - Midodrine  10 mg p.o. 3 times daily started on 7/16 - Hold off anticoagulation due to anemia - PRBC transfusion to keep hemoglobin >8.0 - Eliquis  5 mg p.o. twice daily started on 7/18   CAD (coronary artery disease):  Patient has stent placement. S/p Aspirin  till 7/19 -Eliquis  resumed on 7/18 - Plavix  resumed on 7/20 Cardiology following.     Chronic diastolic CHF and Cor pulmonale, chronic : 2D echo on 06/18/2024 showed EF 60 to 65%.  Patient does not have leg edema or JVD.  No SOB.   Still euvolemic.  Restart diuretics when needed    Hypophosphatemia: Phos repleted. Monitor electrolytes and replete as needed  Anemia secondary to iron  deficiency and low B12 level 7/17 Hb 7.5, transfuse 1 unit of PRBC to keep Hb >8.0 7/20 Hb 9.0 stable Check CBC daily  Iron  deficiency, Tsat 3%, Venofer  300 mg IV x 2 doses 7/16, & 7/17 start oral supplement with vitamin C  Follow-up with PCP to repeat iron  profile after 3 to 56-month  Vitamin D  deficiency: started vitamin D  50,000 units p.o. weekly, follow with PCP to repeat vitamin D  level after 3 to 6 months.  Vitamin B12 level 210, goal >400: Started vitamin B12 1000 mcg IM injection daily during hospital stay, followed by oral supplement.  Follow-up PCP to repeat vitamin B12 level after 3 to 6 months.   Type  2 diabetes mellitus with peripheral neuropathy [facility]): Recent A1c 8.2, poorly controlled.  Patient is taking Ozempic  Continue sliding scale insulin , glucose running high, added long-acting insulin .   Hyperlipidemia associated with type 2 diabetes mellitus (HCC) -Pravastatin    Hypothyroidism:  Synthroid    Nausea, vomiting: Resolved.   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 -F/u with PCP  Constipation:  -MiraLAX  twice daily -Dulcolax 10 mg p.o. nightly from 7/17 - Dulcolax suppository one-time dose on 7/16      Subjective:  No significant events overnight, patient was resting comfortably on the recliner, did work with physical therapy.  No pain in the leg at rest but it hurts 7/10 on movement. Denied any chest pain or palpitations, no shortness of breath.    Physical Exam: Vitals:   07/12/24 0743 07/12/24 0800 07/12/24 1136 07/12/24 1215  BP: (!) 103/50 (!) 111/55 104/62 108/73  Pulse:      Resp: 18 17  14   Temp: 98.1 F (36.7 C)  98.3 F (36.8 C) 98.3 F (36.8 C)  TempSrc: Oral  Oral Oral  SpO2: 94%  97% 96%  Weight:      Height:       General exam: Appears calm and comfortable  Respiratory system: Clear to auscultation. Respiratory effort normal. Cardiovascular system: S1 & S2 heard, RRR. No JVD, murmurs, rubs, gallops or clicks. No pedal edema. Gastrointestinal system: Abdomen is nondistended, soft and nontender. No organomegaly or masses felt. Normal bowel sounds heard. Central nervous system: Alert and oriented x3. No focal neurological deficits. Extremities: Symmetric 5 x 5 power. Skin: No rashes, lesions or ulcers Psychiatry: Judgement and insight appear normal. Mood & affect appropriate.    Data Reviewed:  Results reviewed.  Family Communication: None  Disposition: Status is: Inpatient Remains inpatient appropriate because: Severity of disease, unsafe discharge. DC plan to SNF, follow TOC for placement Patient would like to go to SNF at the  [facility] [place].  7/20 stable to discharge, cleared by cardiology and orthopedic surgery.  Follow TOC for DC plan to  SNF     Time spent: 40 minutes  Author: Elvan Sor, MD 07/12/2024 2:36 PM  For on call review [email or web address].

## 2024-07-12 NOTE — Progress Notes (Signed)
 Progress Note  Patient Name: Alison Richardson Date of Encounter: 07/12/2024  Primary Cardiologist: Perla  Subjective   Feels well this morning. No chest pain, dyspnea, palpitations, dizziness, presyncope, or syncope. Hgb stable.   Inpatient Medications    Scheduled Meds:  apixaban   5 mg Oral BID   vitamin C   500 mg Oral Daily   aspirin  EC  81 mg Oral Daily   Chlorhexidine  Gluconate Cloth  6 each Topical Daily   cyanocobalamin   1,000 mcg Intramuscular Q1200   Followed by   [name] ON 07/15/2024] vitamin B-12  1,000 mcg Oral Daily   digoxin   0.0625 mg Oral Daily   donepezil   10 mg Oral QHS   famotidine   20 mg Oral BID AC   feeding supplement (GLUCERNA SHAKE)  237 mL Oral TID BM   gabapentin   100 mg Oral QHS   insulin  aspart  0-5 Units Subcutaneous QHS   insulin  aspart  0-9 Units Subcutaneous TID WC   insulin  glargine-yfgn  10 Units Subcutaneous Daily   iron  polysaccharides  150 mg Oral Daily   levothyroxine   88 mcg Oral Q0600   lidocaine   1 patch Transdermal Q24H   metoprolol  tartrate  50 mg Oral BID   midodrine   5 mg Oral TID WC   multivitamin with minerals  1 tablet Oral Daily   polyethylene glycol  17 g Oral BID   pravastatin   20 mg Oral QHS   venlafaxine  XR  75 mg Oral Daily   Vitamin D  (Ergocalciferol )  50,000 Units Oral Q7 days   Continuous Infusions:  PRN Meds: acetaminophen , albuterol , bisacodyl , bisacodyl , dextromethorphan -guaiFENesin , hydrALAZINE , methocarbamol , metoprolol  tartrate, morphine  injection, nitroGLYCERIN , ondansetron  (ZOFRAN ) IV, oxyCODONE -acetaminophen , torsemide    Vital Signs    Vitals:   07/11/24 2020 07/11/24 2111 07/11/24 2352 07/12/24 0545  BP:  115/74 109/71 105/64  Pulse:  (!) 106 100 95  Resp: (!) 21 18 19 17   Temp:   98.1 F (36.7 C) 97.8 F (36.6 C)  TempSrc:   Oral Oral  SpO2:   96% 96%  Weight:      Height:        Intake/Output Summary (Last 24 hours) at 07/12/2024 0716 Last data filed at 07/12/2024 0657 Gross per 24 hour   Intake 240 ml  Output 1500 ml  Net -1260 ml   Filed Weights   07/04/24 1823 07/06/24 0000 07/07/24 0500  Weight: 72.6 kg 71.5 kg 73.9 kg    Telemetry    Afib/flutter, 90s bpm with rare runs into the low 100s bpm - Personally Reviewed  ECG    No new tracings - Personally Reviewed  Physical Exam   GEN: No acute distress.   Neck: No JVD. Cardiac: IRIR, I/VI systolic murmur LUSB, no rubs, or gallops.  Respiratory: Clear to auscultation bilaterally.  GI: Soft, nontender, non-distended.   MS: No edema; No deformity. Neuro:  Alert and oriented x 3; Nonfocal.  Psych: Normal affect.  Labs    Chemistry Recent Labs  Lab 07/10/24 0500 07/11/24 0601 07/12/24 0526  NA 140 138 137  K 4.0 4.1 4.2  CL 106 102 102  CO2 27 29 27   GLUCOSE 126* 139* 148*  BUN 10 10 10   CREATININE 0.52 0.55 0.55  CALCIUM  8.4* 8.3* 8.6*  GFRNONAA >60 >60 >60  ANIONGAP 7 7 8      Hematology Recent Labs  Lab 07/10/24 0500 07/11/24 0601 07/12/24 0526  WBC 12.0* 12.4* 11.1*  RBC 3.61* 3.80* 3.63*  HGB 8.8* 9.2* 9.0*  HCT 28.7* 30.2* 29.4*  MCV 79.5* 79.5* 81.0  MCH 24.4* 24.2* 24.8*  MCHC 30.7 30.5 30.6  RDW 15.9* 16.3* 17.0*  PLT 361 364 387    Cardiac EnzymesNo results for input(s): TROPONINI in the last 168 hours. No results for input(s): TROPIPOC in the last 168 hours.   BNP No results for input(s): BNP, PROBNP in the last 168 hours.    DDimer No results for input(s): DDIMER in the last 168 hours.   Radiology    No results found.  Cardiac Studies   Limited echo 06/18/2024: 1. Left ventricular ejection fraction, by estimation, is 60 to 65%. The  left ventricle has normal function. The left ventricle has no regional  wall motion abnormalities. Left ventricular diastolic parameters were  normal.   2. Right ventricular systolic function is normal. The right ventricular  size is normal. There is mildly elevated pulmonary artery systolic  pressure. The estimated  right ventricular systolic pressure is 43.2 mmHg.   3. The mitral valve is normal in structure. Mild to moderate mitral valve  regurgitation. No evidence of mitral stenosis.   4. Tricuspid valve regurgitation is moderate.   5. The aortic valve is tricuspid. Aortic valve regurgitation is not  visualized. No aortic stenosis is present.   6. The inferior vena cava is normal in size with greater than 50%  respiratory variability, suggesting right atrial pressure of 3 mmHg.  __________  [facility] 05/19/2024 Conclusions: Relatively stable appearance of severe two-vessel CAD, including chronic total occlusion of D1 branch and sequential 99% proximal, 40-50% mid, and 70-80% distal RCA stenoses.  There are also moderate, nonobstructive lesions in the ostial/proximal LAD, proximal/mid LCx, and RPDA/RPAV branches. Normal left and right heart filling pressures (mean RA 4, RV 40/5, PCWP 15, LVEDP 15 mmHg).  Prominent V waves noted on PCWP tracing suggest significant mitral regurgitation. Mild pulmonary hypertension (PA 36/15, mean 22 mmHg). Normal Fick cardiac output/index (CO 4.9 L/min, CI 2.7 L/min/m). Successful PCI to proximal and distal RCA stenoses using nonoverlapping Onyx Frontier 3.0 x 15 mm (proximal) and 2.75 x 15 mm (distal) drug-eluting stents with 0% residual stenosis and TIMI-3 flow.   Recommendations: If no evidence of bleeding or vascular injury, resume apixaban  5 mg twice daily this evening.  Anticipate therapy with apixaban , clopidogrel , and aspirin  for 1 week, after which time aspirin  can be stopped.  Continue apixaban  and clopidogrel  therapy for up to 12 months, as tolerated (could switch clopidogrel  to aspirin  at 6 months if there is concern for high bleeding risk). Maintain net even fluid balance. Escalate goal-directed medical therapy as tolerated. Aggressive secondary prevention of coronary artery disease.  Patient Profile     89 y.o. female with history of CAD medically managed,  persistent atrial fibrillation, HFpEF, valvular heart disease, type 2 diabetes, hypertension, hyperlipidemia, and hyperthyroidism, admitted 7/13 after mechanical fall causing L hip fracture, who is being seen for the ongoing management of persistent atrial fibrillation.  Assessment & Plan    1.  Persistent A-fib/flutter: -Remains in A-fib/flutter with reasonably controlled ventricular rates in the 80s to 90s bpm - PAF noted during admission in 04/2024 with amiodarone  discontinued for prolonged QT and concern for amiodarone  toxicity with improving QT this admission - During this admission she was back in A-fib with RVR and restarted on amiodarone  infusion for rate control, though this was again discontinued due to prolonged QT - Rate control has been challenging secondary to relative hypotension requiring initiation of midodrine  and subsequent titration up to  10 mg 3 times daily, now reduced to 5 mg tid with plans to stop over the next day or so if possible -Continue Lopressor  to 50 mg bid - For now, she remains on digoxin , would like to discontinue this prior to discharge  - CHA2DS2-VASc at least 7 (CHF, HTN, age x 2, DM, vascular disease, sex category) - Apixaban  therapy interrupted in the perioperative timeframe and in the context of acute blood loss anemia requiring PRBC transfusion - After discussion with internal medicine and orthopedics, this was resumed on 7/18 with stable Hgb on follow up labs (does not meet reduced dosing criteria)  - Close monitoring of hemoglobin  2.  CAD with recent NSTEMI in 04/2024 status post successful PCI/DES to the proximal and distal RCA without angina: - No symptoms of angina or cardiac decompensation  - Clopidogrel  held in the setting of acute blood loss anemia in the postoperative setting requiring PRBC transfusion - Okay to resume clopidogrel  per orthopedics and internal medicine - Stop aspirin , start Plavix  75 mg daily (will avoid loading dose given prior  transfusion-dependent anemia) with continuation of Eliquis  as above - Currently on pravastatin  20 mg  3. HFimpEF secondary to ICM: - Euvolemic - EF of 40 to 45% at time of NSTEMI in 04/2024, now normalized by echo in 05/2024 - GDMT including spironolactone  and losartan  have been held in the setting of persistent A-fib with RVR with challenging rate control and relative hypotension requiring midodrine  - Now on lower dose midodrine  5 mg tid with plans to taper off over the next day or two, if possible  4.  Mechanical fall with closed left hip fracture status post surgical repair complicated by acute blood loss anemia: - Apixaban  and clopidogrel  held in the setting of acute blood loss anemia requiring PRBC transfusion with stable hgb -She reports intolerance to PPI with nausea and vomiting, currently on famotidine  - Management per IM and orthopedics      For questions or updates, please contact [facility] Please consult [email or web address] for contact info under Cardiology/STEMI.    Signed, Bernardino Bring, PA-C [facility] Pager: [phone] 07/12/2024, 7:16 AM

## 2024-07-12 NOTE — Plan of Care (Signed)
  Problem: Clinical Measurements: Goal: Ability to maintain clinical measurements within normal limits will improve Outcome: Progressing   Problem: Activity: Goal: Risk for activity intolerance will decrease Outcome: Progressing   Problem: Pain Managment: Goal: General experience of comfort will improve and/or be controlled Outcome: Progressing   Problem: Safety: Goal: Ability to remain free from injury will improve Outcome: Progressing

## 2024-07-12 NOTE — Progress Notes (Signed)
 Physical Therapy Treatment Patient Details Name: Alison Richardson MRN: [id number] DOB: [birth year] Today's Date: 07/12/2024   History of Present Illness Alison Richardson is a 89 y.o. female with medical history significant of CAD, recent NSTEMI with stent placement, dCHF, chronic cor pulmonale, A fib on Eliquis , HTN, HDL, DM, asthma, PVD, hypothyroidism, dementia, depression with anxiety, GERD, who presents with fall and left hip pain.  She sustained a left hip fracture, s/p L hip ORIF on 07/17/24, WBAT    PT Comments  Patient seated in recliner upon arrival to session; agreeable to progressive mobility with therapist.  Rates L hip pain 8/10; meds requested and administered (per RN) during session. Able to complete sit/stand and basic transfers from variety of surfaces (BSC, recliner) with min assist +1; increased time and step-by-step cuing for safety/sequencing, but completing with less assist than in previous sessions. Did attempt gait progression, but unable to tolerate due to pain in LE.  Did discuss/encourage consistent use of pain medication per physician orders to consistently and effectively manage pain.     If plan is discharge home, recommend the following: A lot of help with bathing/dressing/bathroom;Assistance with feeding;Assistance with cooking/housework;Assist for transportation;Help with stairs or ramp for entrance;A lot of help with walking and/or transfers   Can travel by private vehicle     No  Equipment Recommendations       Recommendations for Other Services       Precautions / Restrictions Precautions Precautions: Fall Restrictions Weight Bearing Restrictions Per Provider Order: Yes LLE Weight Bearing Per Provider Order: Weight bearing as tolerated     Mobility  Bed Mobility               General bed mobility comments: seated in recliner beginning/end of treatment session    Transfers   Equipment used: Rolling walker (2 wheels) Transfers: Sit to/from  Stand, Bed to chair/wheelchair/BSC Sit to Stand: Min assist Stand pivot transfers: Min assist         General transfer comment: step-by-step cuing for walker position and management; improved ability to unweight and advance R LE this date, heavy WBing bilat UEs.    Ambulation/Gait               General Gait Details: deferred due to pain with movement efforts   Stairs             Wheelchair Mobility     Tilt Bed    Modified Rankin (Stroke Patients Only)       Balance Overall balance assessment: Needs assistance Sitting-balance support: No upper extremity supported, Feet supported Sitting balance-Leahy Scale: Good     Standing balance support: Bilateral upper extremity supported Standing balance-Leahy Scale: Fair                              Hotel manager: No apparent difficulties  Cognition Arousal: Alert Behavior During Therapy: WFL for tasks assessed/performed   PT - Cognitive impairments: History of cognitive impairments                       PT - Cognition Comments: oriented to self, place Following commands: Impaired Following commands impaired: Follows one step commands with increased time    Cueing Cueing Techniques: Verbal cues, Gestural cues, Tactile cues  Exercises Other Exercises Other Exercises: Toilet transfer, SPT with RW, min assist; step-by-step cuing for sequencing of movement transition.  Dep assist for hygiene to ensure  thoroughness    General Comments        Pertinent Vitals/Pain Pain Assessment Pain Assessment: 0-10 Pain Score: 8  Pain Location: L hip with movement, weight bearing Pain Descriptors / Indicators: Discomfort, Grimacing Pain Intervention(s): Limited activity within patient's tolerance, Monitored during session, Patient requesting pain meds-RN notified, RN gave pain meds during session    Home Living                          Prior Function             PT Goals (current goals can now be found in the care plan section) Acute Rehab PT Goals Patient Stated Goal: to go home PT Goal Formulation: With patient Time For Goal Achievement: 07/21/24 Potential to Achieve Goals: Fair Progress towards PT goals: Progressing toward goals    Frequency    7X/week      PT Plan      Co-evaluation              AM-PAC PT 6 Clicks Mobility   Outcome Measure  Help needed turning from your back to your side while in a flat bed without using bedrails?: A Lot Help needed moving from lying on your back to sitting on the side of a flat bed without using bedrails?: A Lot Help needed moving to and from a bed to a chair (including a wheelchair)?: A Little Help needed standing up from a chair using your arms (e.g., wheelchair or bedside chair)?: A Little Help needed to walk in hospital room?: A Lot Help needed climbing 3-5 steps with a railing? : Total 6 Click Score: 13    End of Session Equipment Utilized During Treatment: Gait belt Activity Tolerance: Patient limited by pain Patient left: in chair;with call bell/phone within reach;with chair alarm set Nurse Communication: Mobility status PT Visit Diagnosis: Other abnormalities of gait and mobility (R26.89);Difficulty in walking, not elsewhere classified (R26.2);Muscle weakness (generalized) (M62.81);Pain Pain - Right/Left: Left Pain - part of body: Hip     Time: 1137-1200 PT Time Calculation (min) (ACUTE ONLY): 23 min  Charges:    $Therapeutic Activity: 23-37 mins PT General Charges $$ ACUTE PT VISIT: 1 Visit                     Jasneet Schobert H. Delores, PT, DPT, NCS 07/12/24, 12:54 PM [phone]

## 2024-07-12 NOTE — Progress Notes (Signed)
 Subjective: 6 Days Post-Op Procedure(s) (LRB): OPEN REDUCTION INTERNAL FIXATION HIP (Left) Patient reports pain as mild. Patient is well, and has had no acute complaints or problems Plan is to go Rehab after hospital stay.  She will return back to [place]. Positive bowel movement  Objective: Vital signs in last 24 hours: Temp:  [97.8 F (36.6 C)-98.3 F (36.8 C)] 98.3 F (36.8 C) (07/20 1136) Pulse Rate:  [94-107] 95 (07/20 0545) Resp:  [14-21] 17 (07/20 0800) BP: (102-115)/(50-74) 104/62 (07/20 1136) SpO2:  [94 %-100 %] 97 % (07/20 1136)  Intake/Output from previous day:  Intake/Output Summary (Last 24 hours) at 07/12/2024 1152 Last data filed at 07/12/2024 1044 Gross per 24 hour  Intake 240 ml  Output 1700 ml  Net -1460 ml    Intake/Output this shift: Total I/O In: 240 [P.O.:240] Out: 200 [Urine:200]  Labs: Recent Labs    07/09/24 2030 07/10/24 0500 07/11/24 0601 07/12/24 0526  HGB 8.7* 8.8* 9.2* 9.0*   Recent Labs    07/11/24 0601 07/12/24 0526  WBC 12.4* 11.1*  RBC 3.80* 3.63*  HCT 30.2* 29.4*  PLT 364 387   Recent Labs    07/11/24 0601 07/12/24 0526  NA 138 137  K 4.1 4.2  CL 102 102  CO2 29 27  BUN 10 10  CREATININE 0.55 0.55  GLUCOSE 139* 148*  CALCIUM  8.3* 8.6*   No results for input(s): LABPT, INR in the last 72 hours.    EXAM General - Patient is Alert and Oriented Left lower extremity - Neurovascular intact Sensation intact distally Dorsiflexion/Plantar flexion intact Compartment soft Dressing/Incision - Honeycomb CDI Motor Function - intact, moving foot and toes well on exam.    Past Medical History:  Diagnosis Date   Acute on chronic congestive heart failure (HCC) 10/07/2020   Arrhythmia    Atrial fibrillation and flutter (HCC) 01/06/2019   Plavix  added to eliquis  10-22 after nstemi       Last Assessment & Plan:    Marked facial hematoma post fall but ct of neck and head reportedly normal. No ha or ataxia now but  is using a walker. On eliquis  and plavix      Atrial flutter (HCC) 01/2018   new onset    Basal cell carcinoma of back    Basal cell carcinoma of lip    Cerebral aneurysm    followed by [facility]   CHF (congestive heart failure) (HCC)    DDD (degenerative disc disease), lumbar    superior plate depression, L3 08/18/2014   Diabetes mellitus type II, controlled (HCC)    Diverticulosis    Dysrhythmia    Paroxysmal Supraventricular Tachycardia   Dysthymia    depression   GERD (gastroesophageal reflux disease)    History of falling 04/03/2022   History of meniscal tear    Humeral surgical neck fracture 05/16/2022   Hyperlipidemia    Hypertension    Hypokalemia 12/12/2022   Hypothyroidism    Late onset Alzheimer's disease with behavioral disturbance (HCC)    Leukocytosis 10/07/2020   Mild pulmonary hypertension (HCC)    Moderate asthma without complication 12/12/2022   Multifocal pneumonia 12/09/2022   Overactive bladder    Peripheral vascular disease (HCC)    Puncture wound of forehead 06/11/2023   Seasonal allergic rhinitis    Severe sepsis (HCC) 02/07/2022   Sleep apnea     Assessment/Plan: 6 Days Post-Op Procedure(s) (LRB): OPEN REDUCTION INTERNAL FIXATION HIP (Left) Principal Problem:   Closed left hip fracture (HCC) Active  Problems:   Hypothyroidism   Type 2 diabetes mellitus with peripheral neuropathy (HCC)   Hyperlipidemia associated with type 2 diabetes mellitus (HCC)   Leukocytosis   Chronic diastolic CHF (congestive heart failure) (HCC)   Cor pulmonale, chronic (HCC)   Late onset Alzheimer's disease without behavioral disturbance (HCC)   Depression with anxiety   CAD (coronary artery disease)   Chronic atrial fibrillation with RVR (HCC)   Asthma   Fall at home, initial encounter   Nausea & vomiting   Paroxysmal atrial fibrillation (HCC)   Preop cardiovascular exam   Longstanding persistent atrial fibrillation (HCC)   Fall   Hypotension  Estimated body mass  index is 26.3 kg/m as calculated from the following:   Height as of this encounter: 5' 6 (1.676 m).   Weight as of this encounter: 73.9 kg. Advance diet Up with therapy Pain improving Vital signs are stable Labs are stable Care management to assist with discharge to skilled nursing facility.  Discharge planning: Follow-up with [facility] clinic orthopedics in 2 weeks for staple removal and x-rays of the left hip.   DVT Prophylaxis - TED hose and SCDs, Eliquis , Plavix  Weight-Bearing as tolerated to left leg  T. Medford Amber, PA-C Orthopaedic Surgery 07/12/2024, 11:52 AM

## 2024-07-13 DIAGNOSIS — D62 Acute posthemorrhagic anemia: Secondary | ICD-10-CM | POA: Diagnosis not present

## 2024-07-13 DIAGNOSIS — S72002A Fracture of unspecified part of neck of left femur, initial encounter for closed fracture: Secondary | ICD-10-CM | POA: Diagnosis not present

## 2024-07-13 DIAGNOSIS — I4892 Unspecified atrial flutter: Secondary | ICD-10-CM | POA: Diagnosis not present

## 2024-07-13 DIAGNOSIS — I5030 Unspecified diastolic (congestive) heart failure: Secondary | ICD-10-CM | POA: Diagnosis not present

## 2024-07-13 LAB — CBC
HCT: 31.1 % — ABNORMAL LOW (ref 36.0–46.0)
Hemoglobin: 9.9 g/dL — ABNORMAL LOW (ref 12.0–15.0)
MCH: 25.4 pg — ABNORMAL LOW (ref 26.0–34.0)
MCHC: 31.8 g/dL (ref 30.0–36.0)
MCV: 79.7 fL — ABNORMAL LOW (ref 80.0–100.0)
Platelets: 421 K/uL — ABNORMAL HIGH (ref 150–400)
RBC: 3.9 MIL/uL (ref 3.87–5.11)
RDW: 18.8 % — ABNORMAL HIGH (ref 11.5–15.5)
WBC: 9.9 K/uL (ref 4.0–10.5)
nRBC: 0.3 % — ABNORMAL HIGH (ref 0.0–0.2)

## 2024-07-13 LAB — GLUCOSE, CAPILLARY
Glucose-Capillary: 112 mg/dL — ABNORMAL HIGH (ref 70–99)
Glucose-Capillary: 198 mg/dL — ABNORMAL HIGH (ref 70–99)
Glucose-Capillary: 170 mg/dL — ABNORMAL HIGH (ref 70–99)
Glucose-Capillary: 162 mg/dL — ABNORMAL HIGH (ref 70–99)

## 2024-07-13 LAB — BASIC METABOLIC PANEL WITH GFR
Anion gap: 10 (ref 5–15)
BUN: 11 mg/dL (ref 8–23)
CO2: 27 mmol/L (ref 22–32)
Calcium: 8.6 mg/dL — ABNORMAL LOW (ref 8.9–10.3)
Chloride: 101 mmol/L (ref 98–111)
Creatinine, Ser: 0.68 mg/dL (ref 0.44–1.00)
GFR, Estimated: >60 mL/min (ref >60)
Glucose, Bld: 115 mg/dL — ABNORMAL HIGH (ref 70–99)
Potassium: 3.9 mmol/L (ref 3.5–5.1)
Sodium: 138 mmol/L (ref 135–145)

## 2024-07-13 NOTE — TOC Progression Note (Addendum)
 Transition of Care [facility]) - Progression Note    Patient Details  Name: Alison Richardson MRN: [id number] Date of Birth: 01/19/1933  Transition of Care [facility]) CM/SW Contact  Alison JAYSON Childes, RN Phone Number: 07/13/2024, 10:51 AM  Clinical Narrative:    Attempt to reach Alison Richardson, Admissions Coordinator from [facility] [place]. No answer. Left a message.    Request for auth to be started sent to [facility] assistant Alison Richardson.   2:45pm Spoke with Alison Richardson, Admissions Coordinator for facility. Patient can return to the facility tomorrow provided shara is approved and discharge summary is received by 1pm.  MD made aware.          Expected Discharge Plan and Services                                               Social Determinants of Health (SDOH) Interventions SDOH Screenings   Food Insecurity: No Food Insecurity (07/05/2024)  Housing: Low Risk  (07/05/2024)  Transportation Needs: No Transportation Needs (07/05/2024)  Utilities: Not At Risk (07/05/2024)  Alcohol Screen: Low Risk  (04/09/2023)  Depression (PHQ2-9): Low Risk  (05/05/2024)  Financial Resource Strain: Low Risk  (05/20/2024)  Physical Activity: Insufficiently Active (04/09/2023)  Social Connections: Socially Isolated (07/05/2024)  Stress: No Stress Concern Present (04/09/2023)  Tobacco Use: Low Risk  (07/06/2024)    Readmission Risk Interventions    05/20/2024   11:59 AM 03/29/2024    4:52 PM 01/26/2022    2:47 PM  Readmission Risk Prevention Plan  Transportation Screening Complete Complete Complete  PCP or Specialist Appt within 5-7 Days  Complete   PCP or Specialist Appt within 3-5 Days Complete  Complete  Home Care Screening  Complete   Medication Review (RN CM)  Complete   HRI or Home Care Consult Complete  Complete  Social Work Consult for Recovery Care Planning/Counseling Complete  Complete  Palliative Care Screening Not Applicable  Not Applicable  Medication Review Oceanographer) Complete  Complete

## 2024-07-13 NOTE — Progress Notes (Addendum)
 PROGRESS NOTE  Alison Richardson FMW:[id number] DOB: 02/13/1933 DOA: 07/04/2024 PCP: Herold Hadassah SQUIBB, MD   LOS: 9 days   Brief narrative:  Alison Richardson is a 89 y.o. female with medical history significant of CAD, recent NSTEMI with stent placement, dCHF, chronic cor pulmonale, A fib on Eliquis , HTN, HDL, DM, asthma, PVD, hypothyroidism, dementia, depression with anxiety, GERD, presented to hospital with fall and left hip pain.  In the ED, patient was noted to have left hip fracture and underwent  ORIF 7/14.  At this time patient is awaiting for skilled nursing facility placement.  Assessment/Plan: Principal Problem:   Closed left hip fracture (HCC) Active Problems:   Chronic atrial fibrillation with RVR (HCC)   Fall at home, initial encounter   CAD (coronary artery disease)   Chronic diastolic CHF (congestive heart failure) (HCC)   Cor pulmonale, chronic (HCC)   Type 2 diabetes mellitus with peripheral neuropathy (HCC)   Hyperlipidemia associated with type 2 diabetes mellitus (HCC)   Hypothyroidism   Asthma   Nausea & vomiting   Late onset Alzheimer's disease without behavioral disturbance (HCC)   Depression with anxiety   Leukocytosis   Paroxysmal atrial fibrillation (HCC)   Preop cardiovascular exam   Longstanding persistent atrial fibrillation (HCC)   Fall   Hypotension  Assessment and Plan:   Closed left hip fracture secondary to Fall at home, initial encounter  X-ray showed acute comminuted intertrochanteric fracture of the left femur. Cardiology  cleared her for surgery, patient had ORIF 7/14 by orthopedics.  PT has seen the patient and recommended skilled nursing facility placement..  Leukocytosis, reactive.  Has resolved    Acute hypoxemia post op.  Resolved, currently on RA     Chronic atrial fibrillation with RVR:  Continue Eliquis , metoprolol .  Amiodarone  was discontinued on 717 due to prolonged QTc.  Midodrine  was initiated 7/16.  Cardiology following.  Rate  control has been challenging due to intolerance of amiodarone  with QT prolongation.  Digoxin  has been discontinued as well.  Currently on metoprolol  50 mg twice a day and Eliquis .  Dose of midodrine  has been decreased.   CAD (coronary artery disease):  Status post stent placement.  Continue Plavix  and Eliquis .    Chronic diastolic CHF and Cor pulmonale, chronic :  2D echo on 06/18/2024 showed EF 60 to 65%.  Current compensated.  On spironolactone  daily and torsemide  at home as needed.  Will restart on discharge.    Hypophosphatemia:  Replenished and improved.  Latest phosphorus of 3.6.   Anemia secondary to iron  deficiency and low B12 level .  1 unit of packed RBC during hospitalization with recent hemoglobin of 9.9 and has remained stable..   Iron  deficiency, Tsat 3%, Venofer  300 mg IV x 2 doses 7/16, & 7/17 start oral supplement with vitamin C . Follow-up with PCP to repeat iron  profile after 3 to 65-month   Vitamin D  deficiency: started vitamin D  50,000 units p.o. weekly, follow with PCP to repeat vitamin D  level after 3 to 6 months.   Vitamin B12 level 210, goal >400: Started vitamin B12 1000 mcg IM injection daily during hospital stay, followed by oral supplement.  Follow-up PCP to repeat vitamin B12 level after 3 to 6 months.    Type 2 diabetes mellitus with peripheral neuropathy (HCC):  Hemoglobin A1c of 8.2.  Recent hemoglobin A1c of 8.2.  On Ozempic  as outpatient.  Continue sliding scale insulin  long-acting insulin  diabetic diet while in the hospital   Hyperlipidemia associated with type  2 diabetes mellitus  Continue pravastatin    Hypothyroidism:  Synthroid    Nausea, vomiting: Resolved.   Asthma: Stable Continue bronchodilators Mucinex .   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 Plan to follow-up as outpatient with PCP.   Constipation:  Continue MiraLAX  Dulcolax as necessary.    DVT prophylaxis: SCDs Start: 07/06/24 1557 SCDs Start: 07/04/24 1944 apixaban  (ELIQUIS ) tablet 5 mg   Disposition: Skilled nursing facility.  Medically stable for disposition  Status is: Inpatient Remains inpatient appropriate because: Pending skilled nursing facility placement.    Code Status:     Code Status: Do not attempt resuscitation (DNR) PRE-ARREST INTERVENTIONS DESIRED  Family Communication: None at bedside.  Spoke with the patient's daughter on the phone and updated her about the clinical condition of the patient and potential disposition 07/14/2024  Consultants: Cardiology  Procedures: Status post ORIF of the left hip on 07/06/2024  Anti-infectives:  None  Anti-infectives (From admission, onward)    Start     Dose/Rate Route Frequency Ordered Stop   07/06/24 1800  ceFAZolin  (ANCEF ) IVPB 2g/100 mL premix        2 g 200 mL/hr over 30 Minutes Intravenous Every 6 hours 07/06/24 1622 07/07/24 0027   07/06/24 1300  ceFAZolin  (ANCEF ) IVPB 2g/100 mL premix       Note to Pharmacy: To be administered intravenously immediately before surgery and not to be sent to the floor.   2 g 200 mL/hr over 30 Minutes Intravenous Every 6 hours 07/06/24 1245 07/06/24 1348        Subjective: Today, patient was seen and examined at bedside.  Patient feels okay.  Denies any nausea vomiting shortness of breath dyspnea dizziness or lightheadedness.  Complains of mild discomfort  at the surgical site  Objective: Vitals:   07/13/24 0400 07/13/24 0739  BP: 114/77 123/77  Pulse: 88 85  Resp: 18 18  Temp: 98 F (36.7 C) 98.7 F (37.1 C)  SpO2: 96% 96%    Intake/Output Summary (Last 24 hours) at 07/13/2024 1118 Last data filed at 07/13/2024 0940 Gross per 24  hour  Intake 640 ml  Output 1500 ml  Net -860 ml   Filed Weights   07/04/24 1823 07/06/24 0000 07/07/24 0500  Weight: 72.6 kg 71.5 kg 73.9 kg   Body mass index is 26.3 kg/m.   Physical Exam: GENERAL: Patient is alert awake and oriented. Not in obvious distress.  Elderly female, alert awake and Communicative. HENT: No scleral pallor or icterus. Pupils equally reactive to light. Oral mucosa is moist NECK: is supple, no gross swelling noted. CHEST: Clear to auscultation. No crackles or wheezes.  Diminished breath sounds bilaterally. CVS: S1 and S2 heard, no murmur. Regular rate and rhythm.  ABDOMEN: Soft, non-tender, bowel sounds are present. EXTREMITIES: No edema.  Left hip with surgical dressing. CNS: Cranial nerves are intact. No focal motor deficits. SKIN: warm and dry without rashes.  Left hip with surgical dressing.  Data Review: I have personally reviewed the following laboratory data and studies,  CBC: Recent Labs  Lab 07/09/24 0425 07/09/24 2030 07/10/24 0500 07/11/24 0601 07/12/24 0526 07/13/24 0604  WBC 13.1*  --  12.0* 12.4* 11.1* 9.9  HGB  7.5* 8.7* 8.8* 9.2* 9.0* 9.9*  HCT 24.1* 28.0* 28.7* 30.2* 29.4* 31.1*  MCV 77.7*  --  79.5* 79.5* 81.0 79.7*  PLT 319  --  361 364 387 421*   Basic Metabolic Panel: Recent Labs  Lab 07/06/24 2233 07/08/24 1006 07/09/24 0425 07/10/24 0500 07/11/24 0601 07/12/24 0526 07/13/24 0604  NA 134* 136 137 140 138 137 138  K 4.5 3.9 4.1 4.0 4.1 4.2 3.9  CL 99 103 104 106 102 102 101  CO2 22 24 26 27 29 27 27   GLUCOSE 233* 236* 151* 126* 139* 148* 115*  BUN 16 14 9 10 10 10 11   CREATININE 0.86 0.58 0.58 0.52 0.55 0.55 0.68  CALCIUM  8.2* 8.4* 8.2* 8.4* 8.3* 8.6* 8.6*  MG 1.9 2.2 2.2 2.1 2.0  --   --   PHOS  --  2.0* 2.9 3.2 3.6  --   --    Liver Function Tests: No results for input(s): AST, ALT, ALKPHOS, BILITOT, PROT, ALBUMIN in the last 168 hours. No results for input(s): LIPASE, AMYLASE in the last 168  hours. No results for input(s): AMMONIA in the last 168 hours. Cardiac Enzymes: No results for input(s): CKTOTAL, CKMB, CKMBINDEX, TROPONINI in the last 168 hours. BNP (last 3 results) Recent Labs    05/14/24 0034 07/04/24 1949  BNP 290.1* 175.0*    ProBNP (last 3 results) No results for input(s): PROBNP in the last 8760 hours.  CBG: Recent Labs  Lab 07/12/24 0745 07/12/24 1143 07/12/24 1617 07/12/24 1953 07/13/24 0739  GLUCAP 158* 151* 109* 136* 112*   Recent Results (from the past 240 hours)  MRSA Next Gen by PCR, Nasal     Status: None   Collection Time: 07/06/24  7:50 PM   Specimen: Nasal Mucosa; Nasal Swab  Result Value Ref Range Status   MRSA by PCR Next Gen NOT DETECTED NOT DETECTED Final    Comment: (NOTE) The GeneXpert MRSA Assay (FDA approved for NASAL specimens only), is one component of a comprehensive MRSA colonization surveillance program. It is not intended to diagnose MRSA infection nor to guide or monitor treatment for MRSA infections. Test performance is not FDA approved in patients less than 54 years old. Performed at [facility], [address], [place], [place] [address]      Studies: No results found.    Vernal Alstrom, MD  [facility] 07/13/2024  If 7PM-7AM, please contact night-coverage

## 2024-07-13 NOTE — Progress Notes (Signed)
 Occupational Therapy Treatment Patient Details Name: Alison Richardson MRN: [id number] DOB: Mar 24, 1933 Today's Date: 07/13/2024   History of present illness Alison Richardson is a 89 y.o. female with medical history significant of CAD, recent NSTEMI with stent placement, dCHF, chronic cor pulmonale, A fib on Eliquis , HTN, HDL, DM, asthma, PVD, hypothyroidism, dementia, depression with anxiety, GERD, who presents with fall and left hip pain.  She sustained a left hip fracture, s/p L hip ORIF on 07/17/24, WBAT   OT comments  Alison Richardson seen for OT treatment on this date. Upon arrival to room pt in bed, agreeable to tx. Pt requires MAX A for donning socks bed-level; MOD A for bed mobility and STS w/ RW at EOB. Pt improves to MIN A for step pivot to chair. Pt motivated and participatory in mobility, requiring minimal cueing and demonstrating good recall of sequencing RW for t/f to chair. Pt making good progress toward goals, will continue to follow POC. Discharge recommendation remains appropriate.        If plan is discharge home, recommend the following:  Two people to help with walking and/or transfers;Two people to help with bathing/dressing/bathroom   Equipment Recommendations  BSC/3in1    Recommendations for Other Services      Precautions / Restrictions Precautions Precautions: Fall Recall of Precautions/Restrictions: Impaired Restrictions Weight Bearing Restrictions Per Provider Order: Yes LLE Weight Bearing Per Provider Order: Weight bearing as tolerated       Mobility Bed Mobility Overal bed mobility: Needs Assistance Bed Mobility: Supine to Sit     Supine to sit: Mod assist, HOB elevated, Used rails (+ HHA)          Transfers Overall transfer level: Needs assistance Equipment used: Rolling walker (2 wheels) Transfers: Sit to/from Stand, Bed to chair/wheelchair/BSC Sit to Stand: Mod assist     Step pivot transfers: Min assist           Balance Overall balance  assessment: Needs assistance Sitting-balance support: No upper extremity supported, Feet supported Sitting balance-Leahy Scale: Good     Standing balance support: Bilateral upper extremity supported, Reliant on assistive device for balance Standing balance-Leahy Scale: Fair                             ADL either performed or assessed with clinical judgement   ADL Overall ADL's : Needs assistance/impaired                                       General ADL Comments: MAX A for donning socks bed-level    Extremity/Trunk Assessment Upper Extremity Assessment Upper Extremity Assessment: Overall WFL for tasks assessed   Lower Extremity Assessment Lower Extremity Assessment: Generalized weakness        Vision       Perception     Praxis     Communication Communication Communication: No apparent difficulties   Cognition Arousal: Alert Behavior During Therapy: WFL for tasks assessed/performed Cognition: No family/caregiver present to determine baseline                               Following commands: Impaired Following commands impaired: Follows one step commands with increased time      Cueing   Cueing Techniques: Verbal cues, Gestural cues, Tactile cues  Exercises  Shoulder Instructions       General Comments      Pertinent Vitals/ Pain       Pain Assessment Pain Assessment: Faces Faces Pain Scale: Hurts little more Pain Descriptors / Indicators: Discomfort, Grimacing, Moaning Pain Intervention(s): Limited activity within patient's tolerance, Monitored during session, Repositioned  Home Living                                          Prior Functioning/Environment              Frequency  Min 2X/week        Progress Toward Goals  OT Goals(current goals can now be found in the care plan section)  Progress towards OT goals: Progressing toward goals  Acute Rehab OT Goals Patient  Stated Goal: to go home OT Goal Formulation: With patient Time For Goal Achievement: 07/27/24 Potential to Achieve Goals: Good ADL Goals Pt Will Perform Grooming: with min assist;standing Pt Will Perform Lower Body Dressing: with min assist;sit to/from stand Pt Will Transfer to Toilet: with min assist;ambulating;regular height toilet  Plan      Co-evaluation                 AM-PAC OT 6 Clicks Daily Activity     Outcome Measure   Help from another person eating meals?: None Help from another person taking care of personal grooming?: A Little Help from another person toileting, which includes using toliet, bedpan, or urinal?: A Lot Help from another person bathing (including washing, rinsing, drying)?: A Lot Help from another person to put on and taking off regular upper body clothing?: A Little Help from another person to put on and taking off regular lower body clothing?: A Lot 6 Click Score: 16    End of Session Equipment Utilized During Treatment: Rolling walker (2 wheels)  OT Visit Diagnosis: Other abnormalities of gait and mobility (R26.89);Muscle weakness (generalized) (M62.81)   Activity Tolerance Patient tolerated treatment well   Patient Left in chair;Other (comment) (w/ PT)   Nurse Communication          Time: 8868-8854 OT Time Calculation (min): 14 min  Charges: OT General Charges $OT Visit: 1 Visit OT Treatments $Self Care/Home Management : 8-22 mins  [place], Student OT   [employer] 07/13/2024, 1:13 PM

## 2024-07-13 NOTE — Progress Notes (Signed)
 Rounding Note   Patient Name: Alison Richardson Date of Encounter: 07/13/2024  [facility] Cardiologist: Evalene Lunger, MD  Subjective Patient reports feeling well today with no complaints aside from pain in her left hip with movement.  She denies chest pain, shortness of breath, and palpitations.  She remains in atrial fibrillation/flutter with rates reasonably well-controlled 80 to 110 bpm.  Scheduled Meds:  apixaban   5 mg Oral BID   vitamin C   500 mg Oral Daily   clopidogrel   75 mg Oral Daily   cyanocobalamin   1,000 mcg Intramuscular Q1200   Followed by   [name] ON 07/15/2024] vitamin B-12  1,000 mcg Oral Daily   donepezil   10 mg Oral QHS   famotidine   20 mg Oral BID AC   feeding supplement (GLUCERNA SHAKE)  237 mL Oral TID BM   gabapentin   100 mg Oral QHS   insulin  aspart  0-5 Units Subcutaneous QHS   insulin  aspart  0-9 Units Subcutaneous TID WC   insulin  glargine-yfgn  10 Units Subcutaneous Daily   iron  polysaccharides  150 mg Oral Daily   levothyroxine   88 mcg Oral Q0600   lidocaine   1 patch Transdermal Q24H   metoprolol  tartrate  50 mg Oral BID   midodrine   5 mg Oral TID WC   multivitamin with minerals  1 tablet Oral Daily   polyethylene glycol  17 g Oral BID   pravastatin   20 mg Oral QHS   venlafaxine  XR  75 mg Oral Daily   Vitamin D  (Ergocalciferol )  50,000 Units Oral Q7 days   Continuous Infusions:  PRN Meds: acetaminophen , albuterol , bisacodyl , bisacodyl , dextromethorphan -guaiFENesin , hydrALAZINE , hydrOXYzine , methocarbamol , metoprolol  tartrate, morphine  injection, nitroGLYCERIN , ondansetron  (ZOFRAN ) IV, oxyCODONE -acetaminophen , torsemide    Vital Signs  Vitals:   07/12/24 1953 07/13/24 0055 07/13/24 0400 07/13/24 0739  BP: (!) 113/55  114/77 123/77  Pulse: 97 84 88 85  Resp: 16  18 18   Temp: 98.5 F (36.9 C) 97.8 F (36.6 C) 98 F (36.7 C) 98.7 F (37.1 C)  TempSrc: Oral Oral Oral   SpO2: 94% 95% 96% 96%  Weight:      Height:         Intake/Output Summary (Last 24 hours) at 07/13/2024 1052 Last data filed at 07/13/2024 0940 Gross per 24 hour  Intake 640 ml  Output 1500 ml  Net -860 ml      07/07/2024    5:00 AM 07/06/2024   12:00 AM 07/04/2024    6:23 PM  Last 3 Weights  Weight (lbs) 162 lb 14.7 oz 157 lb 10.1 oz 160 lb  Weight (kg) 73.9 kg 71.5 kg 72.576 kg      Telemetry Atrial flutter with rate 80-110 bpm - Personally Reviewed  Physical Exam  GEN: No acute distress.   Neck: No JVD Cardiac: IRIR, no murmurs, rubs, or gallops.  Respiratory: Clear to auscultation bilaterally. GI: Soft, nontender, non-distended  MS: No edema; No deformity. Neuro:  Nonfocal  Psych: Normal affect   Labs High Sensitivity Troponin:  No results for input(s): TROPONINIHS in the last 720 hours.   Chemistry Recent Labs  Lab 07/09/24 0425 07/10/24 0500 07/11/24 0601 07/12/24 0526 07/13/24 0604  NA 137 140 138 137 138  K 4.1 4.0 4.1 4.2 3.9  CL 104 106 102 102 101  CO2 26 27 29 27 27   GLUCOSE 151* 126* 139* 148* 115*  BUN 9 10 10 10 11   CREATININE 0.58 0.52 0.55 0.55 0.68  CALCIUM  8.2* 8.4* 8.3* 8.6*  8.6*  MG 2.2 2.1 2.0  --   --   GFRNONAA >60 >60 >60 >60 >60  ANIONGAP 7 7 7 8 10     Lipids No results for input(s): CHOL, TRIG, HDL, LABVLDL, LDLCALC, CHOLHDL in the last 168 hours.  Hematology Recent Labs  Lab 07/11/24 0601 07/12/24 0526 07/13/24 0604  WBC 12.4* 11.1* 9.9  RBC 3.80* 3.63* 3.90  HGB 9.2* 9.0* 9.9*  HCT 30.2* 29.4* 31.1*  MCV 79.5* 81.0 79.7*  MCH 24.2* 24.8* 25.4*  MCHC 30.5 30.6 31.8  RDW 16.3* 17.0* 18.8*  PLT 364 387 421*   Thyroid  No results for input(s): TSH, FREET4 in the last 168 hours.  BNPNo results for input(s): BNP, PROBNP in the last 168 hours.  DDimer No results for input(s): DDIMER in the last 168 hours.   Radiology  No results found.  Cardiac Studies Limited echo 06/18/2024: 1. Left ventricular ejection fraction, by estimation, is 60 to 65%.  The  left ventricle has normal function. The left ventricle has no regional  wall motion abnormalities. Left ventricular diastolic parameters were  normal.   2. Right ventricular systolic function is normal. The right ventricular  size is normal. There is mildly elevated pulmonary artery systolic  pressure. The estimated right ventricular systolic pressure is 43.2 mmHg.   3. The mitral valve is normal in structure. Mild to moderate mitral valve  regurgitation. No evidence of mitral stenosis.   4. Tricuspid valve regurgitation is moderate.   5. The aortic valve is tricuspid. Aortic valve regurgitation is not  visualized. No aortic stenosis is present.   6. The inferior vena cava is normal in size with greater than 50%  respiratory variability, suggesting right atrial pressure of 3 mmHg.  __________   [facility] 05/19/2024 Conclusions: Relatively stable appearance of severe two-vessel CAD, including chronic total occlusion of D1 branch and sequential 99% proximal, 40-50% mid, and 70-80% distal RCA stenoses.  There are also moderate, nonobstructive lesions in the ostial/proximal LAD, proximal/mid LCx, and RPDA/RPAV branches. Normal left and right heart filling pressures (mean RA 4, RV 40/5, PCWP 15, LVEDP 15 mmHg).  Prominent V waves noted on PCWP tracing suggest significant mitral regurgitation. Mild pulmonary hypertension (PA 36/15, mean 22 mmHg). Normal Fick cardiac output/index (CO 4.9 L/min, CI 2.7 L/min/m). Successful PCI to proximal and distal RCA stenoses using nonoverlapping Onyx Frontier 3.0 x 15 mm (proximal) and 2.75 x 15 mm (distal) drug-eluting stents with 0% residual stenosis and TIMI-3 flow.   Recommendations: If no evidence of bleeding or vascular injury, resume apixaban  5 mg twice daily this evening.  Anticipate therapy with apixaban , clopidogrel , and aspirin  for 1 week, after which time aspirin  can be stopped.  Continue apixaban  and clopidogrel  therapy for up to 12 months, as  tolerated (could switch clopidogrel  to aspirin  at 6 months if there is concern for high bleeding risk). Maintain net even fluid balance. Escalate goal-directed medical therapy as tolerated. Aggressive secondary prevention of coronary artery disease.  Patient Profile   89 y.o. female with history of CAD, persistent atrial fibrillation, HFpEF, valvular heart disease, type 2 diabetes, hypertension, hyperlipidemia, and hypothyroidism, admitted 7/13 after mechanical fall causing left hip fracture who is being seen for the ongoing management of persistent atrial fibrillation.  Assessment & Plan   Persistent atrial fibrillation/flutter - PAF noted during admission in 04/2024 with amiodarone  discontinued for prolonged QT and concern for amiodarone  toxicity - During this admission she was back in atrial fibrillation with RVR and restarted on amiodarone   infusion for rate control, though this was again discontinued due to prolonged QT - Rate control has been challenging secondary to relative hypotension requiring initiation of midodrine  and subsequent titration up to 10 mg 3 times daily, now reduced to 5 mg 3 times daily with plans to stop prior to discharge if possible - She remains in atrial fibrillation/flutter with rates reasonably controlled at 80 to 110 bpm - Will discontinue digoxin  as this is not a good long-term option - Continue metoprolol  tartrate 50 mg twice daily - Continue Eliquis  5 mg twice daily in the setting of CHA2DS2-VASc at least 7 (this was interrupted in the perioperative timeframe secondary to acute blood loss anemia requiring PRBC transfusion, resumed to 7/18)  CAD with recent NSTEMI 04/2024 s/p DES to the RCA - No symptoms of angina or cardiac decompensation - Clopidogrel  held in the setting of acute blood loss anemia in the postoperative setting requiring PRBC transfusion, this was resumed 7/20 - No aspirin  in the setting of long-term DOAC - Continued on pravastatin  20 mg  daily  HFimpEF - EF of 40 to 45% at time of NSTEMI in 04/2024, now normalized by echo 05/2024 - Appears euvolemic on exam - GDMT including spironolactone  and losartan  have been held in the setting of persistent A-fib with RVR with challenging rate control and relative hypotension requiring midodrine   Mechanical fall with close left hip fracture status post surgical repair complicated by acute blood loss anemia - Apixaban  and clopidogrel  held in the setting of acute blood loss anemia requiring PRBC transfusion with stable hemoglobin - Reports intolerance to PPI with nausea and vomiting, currently on famotidine  - Ongoing management per IM and orthopedics  For questions or updates, please contact [facility] Please consult [email or web address] for contact info under     Signed, Lesley LITTIE Maffucci, PA-C  07/13/2024, 10:52 AM

## 2024-07-13 NOTE — Progress Notes (Signed)
 Physical Therapy Treatment Patient Details Name: Alison Richardson MRN: [id number] DOB: 01-08-1933 Today's Date: 07/13/2024   History of Present Illness Alison Richardson is a 89 y.o. female with medical history significant of CAD, recent NSTEMI with stent placement, dCHF, chronic cor pulmonale, A fib on Eliquis , HTN, HDL, DM, asthma, PVD, hypothyroidism, dementia, depression with anxiety, GERD, who presents with fall and left hip pain.  She sustained a left hip fracture, s/p L hip ORIF on 07/17/24, WBAT    PT Comments  Patient up in chair, finishing with OT session upon arrival to room.  Agreeable to continued participation with therapy, let's get it done. Able to initiate short-distance gait training with RW, min assist +1 for safety. Demonstrates 3-point, step to gait pattern leading with L LE; requires step-by-step verbal cuing for gait sequencing. Slow and deliberate, decreased WBing/stance time L LE; heavy WBing bilat UEs.    If plan is discharge home, recommend the following: A lot of help with bathing/dressing/bathroom;Assistance with feeding;Assistance with cooking/housework;Assist for transportation;Help with stairs or ramp for entrance;A lot of help with walking and/or transfers   Can travel by private vehicle     No  Equipment Recommendations       Recommendations for Other Services       Precautions / Restrictions Precautions Precautions: Fall Restrictions Weight Bearing Restrictions Per Provider Order: Yes LLE Weight Bearing Per Provider Order: Weight bearing as tolerated     Mobility  Bed Mobility               General bed mobility comments: seated in recliner beginning/end of treatment session    Transfers Overall transfer level: Needs assistance Equipment used: Rolling walker (2 wheels) Transfers: Sit to/from Stand Sit to Stand: Min assist           General transfer comment: step-by-step cuing for walker position and management; improved ability to  unweight and advance R LE this date, heavy WBing bilat UEs.    Ambulation/Gait Ambulation/Gait assistance: Min assist Gait Distance (Feet):  (8' x2)           General Gait Details: 3-point, step to gait pattern leading with L LE; requires step-by-step verbal cuing for gait sequencing. Slow and deliberate, decreased WBing/stance time L LE; heavy WBing bilat UEs.   Stairs             Wheelchair Mobility     Tilt Bed    Modified Rankin (Stroke Patients Only)       Balance Overall balance assessment: Needs assistance Sitting-balance support: No upper extremity supported, Feet supported Sitting balance-Leahy Scale: Good     Standing balance support: Bilateral upper extremity supported Standing balance-Leahy Scale: Fair                              Hotel manager: No apparent difficulties  Cognition Arousal: Alert Behavior During Therapy: WFL for tasks assessed/performed   PT - Cognitive impairments: History of cognitive impairments                       PT - Cognition Comments: oriented to self, place Following commands: Impaired Following commands impaired: Follows one step commands with increased time    Cueing    Exercises Other Exercises Other Exercises: Seated L LE therex, 1x10, active ROM: ankle pumps, LAQs, marching.  Fair isolated movement of L LE    General Comments  Pertinent Vitals/Pain Pain Assessment Pain Assessment: Faces Faces Pain Scale: Hurts little more Pain Location: L hip with movement, weight bearing Pain Descriptors / Indicators: Discomfort, Grimacing, Moaning Pain Intervention(s): Limited activity within patient's tolerance, Monitored during session, Repositioned, Premedicated before session    Home Living                          Prior Function            PT Goals (current goals can now be found in the care plan section) Acute Rehab PT Goals Patient  Stated Goal: to go home PT Goal Formulation: With patient Time For Goal Achievement: 07/21/24 Potential to Achieve Goals: Fair Progress towards PT goals: Progressing toward goals    Frequency    7X/week      PT Plan      Co-evaluation              AM-PAC PT 6 Clicks Mobility   Outcome Measure  Help needed turning from your back to your side while in a flat bed without using bedrails?: A Lot Help needed moving from lying on your back to sitting on the side of a flat bed without using bedrails?: A Lot Help needed moving to and from a bed to a chair (including a wheelchair)?: A Little Help needed standing up from a chair using your arms (e.g., wheelchair or bedside chair)?: A Little Help needed to walk in hospital room?: A Little Help needed climbing 3-5 steps with a railing? : A Lot 6 Click Score: 15    End of Session Equipment Utilized During Treatment: Gait belt Activity Tolerance: Patient tolerated treatment well Patient left: in chair;with call bell/phone within reach;with chair alarm set Nurse Communication: Mobility status PT Visit Diagnosis: Other abnormalities of gait and mobility (R26.89);Difficulty in walking, not elsewhere classified (R26.2);Muscle weakness (generalized) (M62.81);Pain Pain - Right/Left: Left Pain - part of body: Hip     Time: 8854-8796 PT Time Calculation (min) (ACUTE ONLY): 18 min  Charges:    $Gait Training: 8-22 mins PT General Charges $$ ACUTE PT VISIT: 1 Visit                     Zaylah Blecha H. Delores, PT, DPT, NCS 07/13/24, 4:24 PM [phone]

## 2024-07-14 ENCOUNTER — Ambulatory Visit: Admitting: Pediatrics

## 2024-07-14 ENCOUNTER — Encounter: Payer: Self-pay | Admitting: Orthopedic Surgery

## 2024-07-14 DIAGNOSIS — S72002A Fracture of unspecified part of neck of left femur, initial encounter for closed fracture: Secondary | ICD-10-CM | POA: Diagnosis not present

## 2024-07-14 LAB — CBC
HCT: 32.6 % — ABNORMAL LOW (ref 36.0–46.0)
Hemoglobin: 10 g/dL — ABNORMAL LOW (ref 12.0–15.0)
MCH: 25.4 pg — ABNORMAL LOW (ref 26.0–34.0)
MCHC: 30.7 g/dL (ref 30.0–36.0)
MCV: 83 fL (ref 80.0–100.0)
Platelets: 441 K/uL — ABNORMAL HIGH (ref 150–400)
RBC: 3.93 MIL/uL (ref 3.87–5.11)
RDW: 19.9 % — ABNORMAL HIGH (ref 11.5–15.5)
WBC: 10.1 K/uL (ref 4.0–10.5)
nRBC: 0.3 % — ABNORMAL HIGH (ref 0.0–0.2)

## 2024-07-14 LAB — BASIC METABOLIC PANEL WITH GFR
Anion gap: 10 (ref 5–15)
BUN: 11 mg/dL (ref 8–23)
CO2: 27 mmol/L (ref 22–32)
Calcium: 8.5 mg/dL — ABNORMAL LOW (ref 8.9–10.3)
Chloride: 102 mmol/L (ref 98–111)
Creatinine, Ser: 0.71 mg/dL (ref 0.44–1.00)
GFR, Estimated: >60 mL/min (ref >60)
Glucose, Bld: 122 mg/dL — ABNORMAL HIGH (ref 70–99)
Potassium: 4.2 mmol/L (ref 3.5–5.1)
Sodium: 139 mmol/L (ref 135–145)

## 2024-07-14 LAB — GLUCOSE, CAPILLARY: Glucose-Capillary: 122 mg/dL — ABNORMAL HIGH (ref 70–99)

## 2024-07-14 MED ORDER — GLUCERNA SHAKE PO LIQD: 237.0000 mL | Freq: Three times a day (TID) | ORAL | Status: DC

## 2024-07-14 MED ORDER — ASCORBIC ACID 500 MG PO TABS: 500.0000 mg | ORAL_TABLET | Freq: Every day | ORAL | Status: AC

## 2024-07-14 MED ORDER — INSULIN GLARGINE-YFGN 100 UNIT/ML ~~LOC~~ SOLN
10.0000 [IU] | Freq: Every day | SUBCUTANEOUS | Status: DC
Start: 2024-07-14 — End: 2024-10-21

## 2024-07-14 MED ORDER — CYANOCOBALAMIN 1000 MCG PO TABS: 1000.0000 ug | ORAL_TABLET | Freq: Every day | ORAL | Status: AC

## 2024-07-14 MED ORDER — POLYETHYLENE GLYCOL 3350 17 G PO PACK: 17.0000 g | PACK | Freq: Every day | ORAL | Status: DC

## 2024-07-14 MED ORDER — POLYSACCHARIDE IRON COMPLEX 150 MG PO CAPS: 150.0000 mg | ORAL_CAPSULE | Freq: Every day | ORAL | Status: DC

## 2024-07-14 MED ORDER — METHOCARBAMOL 500 MG PO TABS: 500.0000 mg | ORAL_TABLET | Freq: Three times a day (TID) | ORAL | Status: AC | PRN

## 2024-07-14 MED ORDER — HYDROXYZINE HCL 25 MG PO TABS: 25.0000 mg | ORAL_TABLET | Freq: Three times a day (TID) | ORAL | Status: DC | PRN

## 2024-07-14 MED ORDER — LIDOCAINE 5 % EX PTCH: 1.0000 | MEDICATED_PATCH | CUTANEOUS | Status: DC

## 2024-07-14 MED ORDER — ADULT MULTIVITAMIN W/MINERALS CH: 1.0000 | ORAL_TABLET | Freq: Every day | ORAL | Status: AC

## 2024-07-14 MED ORDER — BISACODYL 10 MG RE SUPP: 10.0000 mg | Freq: Every day | RECTAL | Status: DC | PRN

## 2024-07-14 MED ORDER — MIDODRINE HCL 5 MG PO TABS: 5.0000 mg | ORAL_TABLET | Freq: Three times a day (TID) | ORAL | Status: DC

## 2024-07-14 MED ORDER — VITAMIN D (ERGOCALCIFEROL) 1.25 MG (50000 UNIT) PO CAPS: 50000.0000 [IU] | ORAL_CAPSULE | ORAL | Status: AC

## 2024-07-14 MED ORDER — METOPROLOL TARTRATE 50 MG PO TABS: 50.0000 mg | ORAL_TABLET | Freq: Two times a day (BID) | ORAL | Status: DC

## 2024-07-14 NOTE — TOC Transition Note (Signed)
 Transition of Care [facility]) - Discharge Note   Patient Details  Name: Alison Richardson MRN: [id number] Date of Birth: [birth year]  Transition of Care [facility]) CM/SW Contact:  Zineb Glade C Dejanique Ruehl, RN Phone Number: 07/14/2024, 10:29 AM   Clinical Narrative:    Spoke with patient's daughter Michal to advised patient is discharging to [facility].    Final next level of care: Skilled Nursing Facility Barriers to Discharge: Barriers Resolved   Patient Goals and CMS Choice Patient states their goals for this hospitalization and ongoing recovery are:: SNF          Discharge Placement                Patient to be transferred to facility by: [facility] Name of family member notified: RUDY, DOMEK (Daughter)  [phone] (Mobile) Patient and family notified of of transfer: 07/14/24  Discharge Plan and Services Additional resources added to the After Visit Summary for                                       Social Drivers of Health (SDOH) Interventions SDOH Screenings   Food Insecurity: No Food Insecurity (07/05/2024)  Housing: Low Risk  (07/05/2024)  Transportation Needs: No Transportation Needs (07/05/2024)  Utilities: Not At Risk (07/05/2024)  Alcohol Screen: Low Risk  (04/09/2023)  Depression (PHQ2-9): Low Risk  (05/05/2024)  Financial Resource Strain: Low Risk  (05/20/2024)  Physical Activity: Insufficiently Active (04/09/2023)  Social Connections: Socially Isolated (07/05/2024)  Stress: No Stress Concern Present (04/09/2023)  Tobacco Use: Low Risk  (07/06/2024)     Readmission Risk Interventions    05/20/2024   11:59 AM 03/29/2024    4:52 PM 01/26/2022    2:47 PM  Readmission Risk Prevention Plan  Transportation Screening Complete Complete Complete  PCP or Specialist Appt within 5-7 Days  Complete   PCP or Specialist Appt within 3-5 Days Complete  Complete  Home Care Screening  Complete   Medication Review (RN CM)  Complete   HRI or Home Care Consult Complete   Complete  Social Work Consult for Recovery Care Planning/Counseling Complete  Complete  Palliative Care Screening Not Applicable  Not Applicable  Medication Review Oceanographer) Complete  Complete

## 2024-07-14 NOTE — TOC Transition Note (Signed)
 Transition of Care [facility]) - Discharge Note   Patient Details  Name: Alison Richardson MRN: [id number] Date of Birth: May 15, 1933  Transition of Care [facility]) CM/SW Contact:  Tomasa JAYSON Childes, RN Phone Number: 07/14/2024, 10:44 AM   Clinical Narrative:    Per [name] assistant , Rojelio, SNF approved 07/13/24-07/15/24 plan auth id #[id number]   Spoke with  [name] in admissions. Patient admission confirmed for today. Patient assigned room # 242 Report will be called to [phone] Face sheet and medical necessity forms printed to the floor to be added to the EMS pack EMS arranged  Be there as soon as we can.  Discharge summary and SNF transfer report sent in HUB.  Nurse, and family notified spoke with patient's daughter, [name] signing off.    Final next level of care: Skilled Nursing Facility Barriers to Discharge: Barriers Resolved   Patient Goals and CMS Choice Patient states their goals for this hospitalization and ongoing recovery are:: SNF          Discharge Placement              Patient chooses bed at: [facility] Patient to be transferred to facility by: [facility] Name of family member notified: [name], [name] (Daughter)  [phone] (Mobile) Patient and family notified of of transfer: 07/14/24  Discharge Plan and Services Additional resources added to the After Visit Summary for                                       Social Drivers of Health (SDOH) Interventions SDOH Screenings   Food Insecurity: No Food Insecurity (07/05/2024)  Housing: Low Risk  (07/05/2024)  Transportation Needs: No Transportation Needs (07/05/2024)  Utilities: Not At Risk (07/05/2024)  Alcohol Screen: Low Risk  (04/09/2023)  Depression (PHQ2-9): Low Risk  (05/05/2024)  Financial Resource Strain: Low Risk  (05/20/2024)  Physical Activity: Insufficiently Active (04/09/2023)  Social Connections: Socially Isolated (07/05/2024)  Stress: No Stress Concern Present (04/09/2023)  Tobacco Use: Low  Risk  (07/06/2024)     Readmission Risk Interventions    05/20/2024   11:59 AM 03/29/2024    4:52 PM 01/26/2022    2:47 PM  Readmission Risk Prevention Plan  Transportation Screening Complete Complete Complete  PCP or Specialist Appt within 5-7 Days  Complete   PCP or Specialist Appt within 3-5 Days Complete  Complete  Home Care Screening  Complete   Medication Review (RN CM)  Complete   HRI or Home Care Consult Complete  Complete  Social Work Consult for Recovery Care Planning/Counseling Complete  Complete  Palliative Care Screening Not Applicable  Not Applicable  Medication Review Oceanographer) Complete  Complete

## 2024-07-14 NOTE — Plan of Care (Signed)
  Problem: Clinical Measurements: Goal: Ability to maintain clinical measurements within normal limits will improve Outcome: Progressing   Problem: Activity: Goal: Risk for activity intolerance will decrease Outcome: Progressing   Problem: Elimination: Goal: Will not experience complications related to bowel motility Outcome: Progressing   Problem: Pain Managment: Goal: General experience of comfort will improve and/or be controlled Outcome: Progressing   Problem: Safety: Goal: Ability to remain free from injury will improve Outcome: Progressing

## 2024-07-14 NOTE — NC FL2 (Signed)
 [place]  MEDICAID FL2 LEVEL OF CARE FORM     IDENTIFICATION  Patient Name: Alison Richardson Birthdate: 1933/02/22 Sex: female Admission Date (Current Location): 07/04/2024  [facility] [place] and [place] Number:  Chiropodist and Address:  [facility], [address], [place], [place] [address]      Provider Number: [id number]  Attending Physician Name and Address:  Sonjia Held, MD  Relative Name and Phone Number:  JANYAH, SINGLETERRY (Daughter)  [phone] [facility])    Current Level of Care: Hospital Recommended Level of Care: Skilled Nursing Facility Prior Approval Number:    Date Approved/Denied:   PASRR Number: [id number]  Discharge Plan: SNF    Current Diagnoses: Patient Active Problem List   Diagnosis Date Noted   Acute blood loss anemia 07/13/2024   Hypotension 07/09/2024   Fall 07/08/2024   Longstanding persistent atrial fibrillation (HCC) 07/06/2024   Paroxysmal atrial fibrillation (HCC) 07/05/2024   Preop cardiovascular exam 07/05/2024   Closed left hip fracture (HCC) 07/04/2024   Asthma 07/04/2024   Fall at home, initial encounter 07/04/2024   Nausea & vomiting 07/04/2024   DM type 2 (diabetes mellitus, type 2) (HCC) 05/14/2024   Lactic acidosis 05/14/2024   Chronic atrial fibrillation with RVR (HCC) 03/26/2024   Lumbar radiculopathy 02/06/2023   Depression with anxiety 12/09/2022   CAD (coronary artery disease) 12/09/2022   History of cerebral aneurysm 04/03/2022   Gastroesophageal reflux disease 02/12/2022   DDD (degenerative disc disease), lumbar    NSTEMI (non-ST elevated myocardial infarction) (HCC) 10/10/2021   Heart failure with recovered ejection fraction (HFrecEF) (HCC) 10/13/2020   Leukocytosis 10/07/2020   Cor pulmonale, chronic (HCC) 09/27/2019   Late onset Alzheimer's disease without behavioral disturbance (HCC) 09/23/2019   Obstructive sleep apnea 07/09/2019   Type 2 diabetes mellitus with peripheral neuropathy  (HCC) 01/21/2019   PSVT (paroxysmal supraventricular tachycardia) (HCC) 01/21/2019   PAD (peripheral artery disease) (HCC) 01/06/2019   Hyperlipidemia associated with type 2 diabetes mellitus (HCC) 09/08/2018   Anxiety disorder 06/19/2018   Depression, major, single episode, mild (HCC) 06/19/2018   Atrial flutter with rapid ventricular response (HCC) 06/19/2018   Vitamin B12 deficiency 06/19/2018   Insomnia 06/19/2018   Hypertension associated with diabetes (HCC) 06/19/2018   Hypothyroidism 06/19/2018   Overactive bladder 06/19/2018   Primary osteoarthritis involving multiple joints 06/18/2018    Orientation RESPIRATION BLADDER Height & Weight     Time, Self, Place  Normal External catheter, Incontinent Weight: 73.9 kg Height:  5' 6 (167.6 cm)  BEHAVIORAL SYMPTOMS/MOOD NEUROLOGICAL BOWEL NUTRITION STATUS  Other (Comment) (n/a)   Continent Diet (2 gram sodium)  AMBULATORY STATUS COMMUNICATION OF NEEDS Skin   Limited Assist Verbally Bruising, Surgical wounds, Other (Comment) (eechymosis bilateral arm, erythema hips, sacrum, L thigh surgical incision closed with staples)                       Personal Care Assistance Level of Assistance  Bathing, Dressing, Feeding Bathing Assistance: Limited assistance Feeding assistance: Limited assistance Dressing Assistance: Limited assistance     Functional Limitations Info  Sight, Hearing Sight Info: Adequate Hearing Info: Adequate      SPECIAL CARE FACTORS FREQUENCY  PT (By licensed PT), OT (By licensed OT)     PT Frequency: Min 2x weekly OT Frequency: Min 2x weekly            Contractures Contractures Info: Not present    Additional Factors Info  Code Status, Allergies Code Status Info: DNR Allergies  Info: Pantoprazole , Metformin And Related           Current Medications (07/14/2024):  This is the current hospital active medication list Current Facility-Administered Medications  Medication Dose Route Frequency  Provider Last Rate Last Admin   acetaminophen  (TYLENOL ) tablet 650 mg  650 mg Oral Q6H PRN Maryrose Cordella Charleston, MD   650 mg at 07/07/24 1226   albuterol  (PROVENTIL ) (2.5 MG/3ML) 0.083% nebulizer solution 3 mL  3 mL Inhalation Q4H PRN Maryrose Cordella Charleston, MD       apixaban  (ELIQUIS ) tablet 5 mg  5 mg Oral BID Abigail Motto M, PA-C   5 mg at 07/14/24 9096   ascorbic acid  (VITAMIN C ) tablet 500 mg  500 mg Oral Daily Von Bellis, MD   500 mg at 07/14/24 0903   bisacodyl  (DULCOLAX) EC tablet 10 mg  10 mg Oral Daily PRN Von Bellis, MD       bisacodyl  (DULCOLAX) suppository 10 mg  10 mg Rectal Daily PRN Von Bellis, MD       clopidogrel  (PLAVIX ) tablet 75 mg  75 mg Oral Daily Abigail Motto M, PA-C   75 mg at 07/14/24 9096   cyanocobalamin  (VITAMIN B12) injection 1,000 mcg  1,000 mcg Intramuscular Q1200 Von Bellis, MD   1,000 mcg at 07/13/24 2138   Followed by   [name] ON 07/15/2024] cyanocobalamin  (VITAMIN B12) tablet 1,000 mcg  1,000 mcg Oral Daily Von Bellis, MD       dextromethorphan -guaiFENesin  (MUCINEX  DM) 30-600 MG per 12 hr tablet 1 tablet  1 tablet Oral BID PRN Maryrose Cordella Charleston, MD       donepezil  (ARICEPT ) tablet 10 mg  10 mg Oral QHS Crawley, Gregory Robert, MD   10 mg at 07/13/24 2138   famotidine  (PEPCID ) tablet 20 mg  20 mg Oral BID AC Crawley, Gregory Robert, MD   20 mg at 07/14/24 0903   feeding supplement (GLUCERNA SHAKE) (GLUCERNA SHAKE) liquid 237 mL  237 mL Oral TID BM Crawley, Gregory Robert, MD   237 mL at 07/13/24 1301   gabapentin  (NEURONTIN ) capsule 100 mg  100 mg Oral QHS Maryrose Cordella Charleston, MD   100 mg at 07/13/24 2136   hydrALAZINE  (APRESOLINE ) injection 5 mg  5 mg Intravenous Q2H PRN Maryrose Cordella Charleston, MD       hydrOXYzine  (ATARAX ) tablet 25 mg  25 mg Oral TID PRN Von Bellis, MD   25 mg at 07/12/24 2058   insulin  aspart (novoLOG ) injection 0-5 Units  0-5 Units Subcutaneous QHS Maryrose Cordella Charleston, MD   2 Units at 07/08/24 2136   insulin   aspart (novoLOG ) injection 0-9 Units  0-9 Units Subcutaneous TID WC Maryrose Cordella Charleston, MD   2 Units at 07/13/24 1707   insulin  glargine-yfgn (SEMGLEE ) injection 10 Units  10 Units Subcutaneous Daily Zhang, Dekui, MD   10 Units at 07/14/24 0914   iron  polysaccharides (NIFEREX) capsule 150 mg  150 mg Oral Daily Von Bellis, MD   150 mg at 07/14/24 0902   levothyroxine  (SYNTHROID ) tablet 88 mcg  88 mcg Oral Q0600 Crawley, Gregory Robert, MD   88 mcg at 07/14/24 0600   lidocaine  (LIDODERM ) 5 % 1 patch  1 patch Transdermal Q24H Maryrose Cordella Charleston, MD   1 patch at 07/13/24 2136   methocarbamol  (ROBAXIN ) tablet 500 mg  500 mg Oral Q8H PRN Maryrose Cordella Charleston, MD   500 mg at 07/10/24 2056   metoprolol  tartrate (LOPRESSOR ) injection 2.5 mg  2.5 mg Intravenous Q2H  PRN Crawley, Gregory Robert, MD   2.5 mg at 07/10/24 2300   metoprolol  tartrate (LOPRESSOR ) tablet 50 mg  50 mg Oral BID Abigail Motto M, PA-C   50 mg at 07/14/24 0908   midodrine  (PROAMATINE ) tablet 5 mg  5 mg Oral TID WC Abigail Motto HERO, PA-C   5 mg at 07/14/24 0908   morphine  (PF) 2 MG/ML injection 1 mg  1 mg Intravenous Q3H PRN Maryrose Cordella Charleston, MD   1 mg at 07/06/24 9040   multivitamin with minerals tablet 1 tablet  1 tablet Oral Daily Crawley, Gregory Robert, MD   1 tablet at 07/14/24 0903   nitroGLYCERIN  (NITROSTAT ) SL tablet 0.4 mg  0.4 mg Sublingual Q5 min PRN Maryrose Cordella Charleston, MD       ondansetron  (ZOFRAN ) injection 4 mg  4 mg Intravenous Q8H PRN Maryrose Cordella Charleston, MD   4 mg at 07/08/24 9083   oxyCODONE -acetaminophen  (PERCOCET/ROXICET) 5-325 MG per tablet 0.5-1 tablet  0.5-1 tablet Oral Q4H PRN Zhang, Dekui, MD   1 tablet at 07/14/24 0913   polyethylene glycol (MIRALAX  / GLYCOLAX ) packet 17 g  17 g Oral BID Von Bellis, MD   17 g at 07/13/24 9068   pravastatin  (PRAVACHOL ) tablet 20 mg  20 mg Oral QHS Crawley, Gregory Robert, MD   20 mg at 07/13/24 2138   torsemide  (DEMADEX ) tablet 20 mg  20 mg Oral Daily PRN  Laurita Pillion, MD       venlafaxine  XR (EFFEXOR -XR) 24 hr capsule 75 mg  75 mg Oral Daily Crawley, Gregory Robert, MD   75 mg at 07/14/24 9096   Vitamin D  (Ergocalciferol ) (DRISDOL ) 1.25 MG (50000 UNIT) capsule 50,000 Units  50,000 Units Oral Q7 days Von Bellis, MD   50,000 Units at 07/08/24 2135     Discharge Medications: Please see discharge summary for a list of discharge medications.  Relevant Imaging Results:  Relevant Lab Results:   Additional Information SSN# [id number]  Kira Hartl C Oiva Dibari, RN

## 2024-07-14 NOTE — Progress Notes (Signed)
 AVS printed and placed in discharge folder along with prescriptions. PIV and pure wick removed. Report called to [place] at [facility]. All questions answered.

## 2024-07-14 NOTE — Discharge Summary (Addendum)
 Physician Discharge Summary  Alison Richardson FMW:[id number] DOB: [birth year] DOA: 07/04/2024  PCP: Herold Hadassah SQUIBB, MD  Admit date: 07/04/2024 Discharge date: 07/14/2024  Admitted From: Skilled nursing facility  Discharge disposition: SNF   Recommendations for Outpatient Follow-Up:   Follow up with your primary care provider at the skilled nursing facility in 3 to 5 days Check CBC, BMP, magnesium  in the next visit Follow-up with cardiology Dr. Gollan in 1 to 2 weeks. Follow-up with [facility] clinic orthopedics for staple removal,Wound check in 2 weeks  Discharge Diagnosis:   Principal Problem:   Closed left hip fracture [facility]) Active Problems:   Chronic atrial fibrillation with RVR (HCC)   Fall at home, initial encounter   CAD (coronary artery disease)   Heart failure with recovered ejection fraction (HFrecEF) (HCC)   Cor pulmonale, chronic (HCC)   Type 2 diabetes mellitus with peripheral neuropathy (HCC)   Hyperlipidemia associated with type 2 diabetes mellitus (HCC)   Hypothyroidism   Asthma   Nausea & vomiting   Late onset Alzheimer's disease without behavioral disturbance (HCC)   Depression with anxiety   Leukocytosis   Atrial flutter with rapid ventricular response (HCC)   Paroxysmal atrial fibrillation (HCC)   Preop cardiovascular exam   Longstanding persistent atrial fibrillation (HCC)   Fall   Hypotension   Acute blood loss anemia    Discharge Condition: Improved.  Diet recommendation: Low sodium, heart healthy.   Wound care: None.  Code status: DNR   History of Present Illness:   Alison Richardson is a 89 y.o. female with medical history significant of CAD, recent NSTEMI with stent placement, dCHF, chronic cor pulmonale, A fib on Eliquis , HTN, HDL, DM, asthma, PVD, hypothyroidism, dementia, depression with anxiety, GERD, presented to hospital with fall and left hip pain.  In the ED, patient was noted to have left hip fracture and underwent  ORIF 7/14.     Hospital Course:   Following conditions were addressed during hospitalization as listed below,  Closed left hip fracture secondary to Fall at home, initial encounter  X-ray showed acute comminuted intertrochanteric fracture of the left femur. Cardiology  cleared her for surgery, patient had ORIF 7/14 by orthopedics.  PT has seen the patient and recommended skilled nursing facility placement.  For rehabilitation.   Leukocytosis, reactive.  Has resolved    Acute hypoxemia post op.  Resolved, currently on RA     Chronic atrial fibrillation with RVR:  Continue Eliquis , metoprolol .  Amiodarone  was discontinued on 717 due to prolonged QTc.  Midodrine  was initiated 7/16.  Cardiology for the patient during hospitalization..  Rate control has been challenging due to intolerance of amiodarone  with QT prolongation.  Digoxin  has been discontinued as well.  Currently on metoprolol  50 mg twice a day and Eliquis .  Dose of midodrine  has been decreased.  Patient will need to follow-up with Dr. Gollan with cardiology as outpatient in 1 to 2 weeks.  Cardiology has cleared the patient for discharge at this time.   CAD (coronary artery disease):  Status post stent placement.  Continue Plavix  and Eliquis .  Will continue on discharge.   Chronic diastolic CHF and Cor pulmonale, chronic :  2D echo on 06/18/2024 showed EF 60 to 65%.  Current compensated.  On spironolactone  daily and torsemide  at home as needed.  Will restart on discharge.     Hypophosphatemia:  Replenished and improved.  Latest phosphorus of 3.6.   Anemia secondary to iron  deficiency and low B12 level .  1  unit of packed RBC during hospitalization with recent hemoglobin of 9.9 and has remained stable..   Iron  deficiency, Tsat 3%, Venofer  300 mg IV x 2 doses 7/16, & 7/17 start oral supplement with vitamin C . Follow-up with PCP to repeat iron  profile after 3 to 20-month   Vitamin D  deficiency: started vitamin D  50,000 units p.o. weekly, follow  with PCP to repeat vitamin D  level after 3 to 6 months.   Vitamin B12 level 210, goal >400: Started vitamin B12 1000 mcg IM injection daily during hospital stay, followed by oral supplement.  Follow-up PCP to repeat vitamin B12 level after 3 to 6 months.    Type 2 diabetes mellitus with peripheral neuropathy (HCC):  Hemoglobin A1c of 8.2.    On Ozempic  as outpatient  Hyperlipidemia associated with type 2 diabetes mellitus  Continue pravastatin    Hypothyroidism:  Synthroid    Nausea, vomiting: Resolved.   Asthma: Stable Continue bronchodilators Mucinex .   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 Plan to follow-up as outpatient with PCP.   Constipation:  Continue bowel regimen on discharge.  Disposition.  At this time, patient is stable for disposition to skilled nursing facility with outpatient PCP orthopedics and cardiology follow-up  Medical Consultants:  Orthopedics Cardiology  Procedures:    Status post ORIF left hip 07/06/2024 Subjective:   Today, patient was seen and examined at bedside.  Denies interval complaints.  No chest pain shortness of breath dizziness lightheadedness.  Has mild discomfort but otherwise okay.  Discharge Exam:   Vitals:   07/14/24 0359 07/14/24 0734  BP: 120/72 123/80  Pulse:  96  Resp: (!) 22 18  Temp: 98.2 F (36.8 C) 98.6 F (37 C)  SpO2: 95% 94%   Vitals:   07/13/24 1940 07/13/24 2319 07/14/24 0359 07/14/24 0734  BP: (!) 108/52 113/78 120/72 123/80  Pulse: 86 96  96  Resp: 18 15 (!) 22 18  Temp: 98.1 F (36.7 C) 98 F (36.7 C) 98.2 F (36.8 C) 98.6 F (37 C)  TempSrc: Oral Oral Oral   SpO2: 96% 97% 95% 94%   Weight:      Height:        General: Alert awake, not in obvious distress, elderly female, Communicative. HENT: pupils equally reacting to light,  No scleral pallor or icterus noted. Oral mucosa is moist.  Chest:  Clear breath sounds.  Diminished breath sounds bilaterally. No crackles or wheezes.  CVS: S1 &S2 heard. No murmur.  Irregular rhythm. Abdomen: Soft, nontender, nondistended.  Bowel sounds are heard.   Extremities: No cyanosis, clubbing or edema.  Peripheral pulses are palpable.  Left hip with surgical dressing. Psych: Alert, awake and oriented, normal mood CNS:  No cranial nerve deficits.  Power equal in all extremities.   Skin: Warm and dry.  No rashes noted.  The results of significant diagnostics from this hospitalization (including imaging, microbiology, ancillary and laboratory) are listed below for reference.     Diagnostic Studies:   CT HEAD WO CONTRAST ( ) Result Date: 07/04/2024 CLINICAL DATA:  Head trauma. Fall today. On Eliquis . Left hip fracture. EXAM: CT HEAD WITHOUT CONTRAST TECHNIQUE: Contiguous axial images were obtained from the base of the skull  through the vertex without intravenous contrast. RADIATION DOSE REDUCTION: This exam was performed according to the departmental dose-optimization program which includes automated exposure control, adjustment of the mA and/or kV according to patient size and/or use of iterative reconstruction technique. COMPARISON:  CT head 09/03/2023 FINDINGS: Brain: No intracranial hemorrhage, mass effect, or evidence of acute infarct. No hydrocephalus. No extra-axial fluid collection. Age-commensurate cerebral atrophy and chronic small vessel ischemic disease. Similar 10 mm extra-axial mass over the left frontal convexity at the vertex without mass effect or edema likely represents a meningioma. Vascular: No hyperdense vessel. Intracranial arterial calcification. 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 Skull: No fracture  or focal lesion. Sinuses/Orbits: No acute finding. Other: None. IMPRESSION: 1. No acute intracranial abnormality. 2. 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 3. Similar 10 mm extra-axial mass over the left frontal convexity at the vertex without mass effect or edema likely representing a meningioma. Electronically Signed   By: Norman Gatlin M.D.   On: 07/04/2024 21:08   DG Chest 1 View Result Date: 07/04/2024 CLINICAL DATA:  Fall with femur fracture.  On Eliquis . EXAM: CHEST  1 VIEW COMPARISON:  Radiograph 05/14/2024 FINDINGS: Stable cardiomediastinal silhouette. Aortic atherosclerotic calcification. No focal consolidation, pleural effusion, or pneumothorax. No displaced rib fractures. IMPRESSION: No active disease. Electronically Signed   By: Norman Gatlin M.D.   On: 07/04/2024 19:07   DG Hip Unilat W or Wo Pelvis 2-3 Views Left Result Date: 07/04/2024 CLINICAL DATA:  Fall with severe pain, shortening common external rotation of the left femur EXAM: DG HIP (WITH OR WITHOUT PELVIS) 2-3V LEFT COMPARISON:  None Available. FINDINGS: Acute comminuted intertrochanteric fracture of the left femur. There is superior displacement of femoral shaft. The femoral head remains located in the acetabulum. Degenerative changes pubic symphysis, both hips, SI joints and lower lumbar spine. IMPRESSION: Acute comminuted intertrochanteric fracture of the left femur. Electronically Signed   By: Norman Gatlin M.D.   On: 07/04/2024 19:06     Labs:   Basic Metabolic Panel: Recent Labs  Lab 07/08/24 1006 07/09/24 0425 07/10/24 0500 07/11/24 0601 07/12/24 0526 07/13/24 0604 07/14/24 0542  NA 136 137 140 138 137 138 139  K 3.9 4.1 4.0 4.1 4.2 3.9 4.2  CL 103 104 106 102 102 101 102  CO2 24 26 27 29 27 27 27   GLUCOSE 236* 151* 126* 139* 148* 115* 122*  BUN 14 9 10 10 10 11 11   CREATININE 0.58 0.58 0.52 0.55 0.55 0.68 0.71  CALCIUM  8.4* 8.2* 8.4* 8.3* 8.6* 8.6* 8.5*  MG 2.2 2.2 2.1 2.0  --   --    --   PHOS 2.0* 2.9 3.2 3.6  --   --   --    GFR Estimated Creatinine Clearance: 49 mL/min (by C-G formula based on SCr of 0.71 mg/dL). Liver Function Tests: No results for input(s): AST, ALT, ALKPHOS, BILITOT, PROT, ALBUMIN in the last 168 hours. No results for input(s): LIPASE, AMYLASE in the last 168 hours. No results for input(s): AMMONIA in the last 168 hours. Coagulation profile No results for input(s): INR, PROTIME in the last 168 hours.  CBC: Recent Labs  Lab 07/10/24 0500 07/11/24 0601 07/12/24 0526 07/13/24 0604 07/14/24 0542  WBC 12.0* 12.4* 11.1* 9.9 10.1  HGB 8.8* 9.2* 9.0* 9.9* 10.0*  HCT 28.7* 30.2* 29.4* 31.1* 32.6*  MCV 79.5* 79.5* 81.0 79.7* 83.0  PLT 361 364 387 421* 441*   Cardiac Enzymes: No results for input(s): CKTOTAL,  CKMB, CKMBINDEX, TROPONINI in the last 168 hours. BNP: Invalid input(s): POCBNP CBG: Recent Labs  Lab 07/13/24 0739 07/13/24 1137 07/13/24 1609 07/13/24 1945 07/14/24 0734  GLUCAP 112* 198* 170* 162* 122*   D-Dimer No results for input(s): DDIMER in the last 72 hours. Hgb A1c No results for input(s): HGBA1C in the last 72 hours. Lipid Profile No results for input(s): CHOL, HDL, LDLCALC, TRIG, CHOLHDL, LDLDIRECT in the last 72 hours. Thyroid  function studies No results for input(s): TSH, T4TOTAL, T3FREE, THYROIDAB in the last 72 hours.  Invalid input(s): FREET3 Anemia work up No results for input(s): VITAMINB12, FOLATE, FERRITIN, TIBC, IRON , RETICCTPCT in the last 72 hours. Microbiology Recent Results (from the past 240 hours)  MRSA Next Gen by PCR, Nasal     Status: None   Collection Time: 07/06/24  7:50 PM   Specimen: Nasal Mucosa; Nasal Swab  Result Value Ref Range Status   MRSA by PCR Next Gen NOT DETECTED NOT DETECTED Final    Comment: (NOTE) The GeneXpert MRSA Assay (FDA approved for NASAL specimens only), is one component of a comprehensive  MRSA colonization surveillance program. It is not intended to diagnose MRSA infection nor to guide or monitor treatment for MRSA infections. Test performance is not FDA approved in patients less than 12 years old. Performed at [facility], [address] [place], [place] [address]      Discharge Instructions:   Discharge Instructions     Call MD for:  persistant nausea and vomiting   Complete by: As directed    Call MD for:  redness, tenderness, or signs of infection (pain, swelling, redness, odor or green/yellow discharge around incision site)   Complete by: As directed    Call MD for:  severe uncontrolled pain   Complete by: As directed    Call MD for:  temperature >100.4   Complete by: As directed    Diet - low sodium heart healthy   Complete by: As directed    Discharge instructions   Complete by: As directed    Follow-up with your primary care provider at the skilled nursing facility in 3 to 5 days.  Follow-up with orthopedics at [facility] orthopedics in 2 weeks for staple removal and x-rays of the left hip.  Follow-up with cardiology in 2 to 4 weeks after discharge.  Seek medical attention for worsening symptoms.   Discharge wound care:   Complete by: As directed    Please apply honeycomb dressing to left hip prior to discharge   Increase activity slowly   Complete by: As directed    Weightbearing as tolerated      Allergies as of 07/14/2024       Reactions   Pantoprazole  Nausea And Vomiting   Metformin And Related Diarrhea        Medication List     STOP taking these medications    losartan  25 MG tablet Commonly known as: COZAAR    Spiriva  Respimat 1.25 MCG/ACT Aers Generic drug: Tiotropium Bromide  Monohydrate       TAKE these medications    albuterol  (2.5 MG/3ML) 0.083% nebulizer solution Commonly known as: PROVENTIL  Take 3 mLs (2.5 mg total) by nebulization every 6 (six) hours as needed for wheezing or shortness of breath.    apixaban  5 MG Tabs tablet Commonly known as: ELIQUIS  Take 1 tablet (5 mg total) by mouth 2 (two) times daily.   ascorbic acid  500 MG tablet Commonly known as: VITAMIN C  Take 1 tablet (500 mg  total) by mouth daily.   bisacodyl  10 MG suppository Commonly known as: DULCOLAX Place 1 suppository (10 mg total) rectally daily as needed for severe constipation.   clopidogrel  75 MG tablet Commonly known as: PLAVIX  Take 1 tablet (75 mg total) by mouth daily with breakfast.   cyanocobalamin  1000 MCG tablet Take 1 tablet (1,000 mcg total) by mouth daily. Start taking on: July 15, 2024   donepezil  10 MG tablet Commonly known as: ARICEPT  TAKE ONE TABLET (10 MG) BY MOUTH AT BEDTIME   famotidine  20 MG tablet Commonly known as: PEPCID  Take 1 tablet (20 mg total) by mouth 2 (two) times daily before a meal.   feeding supplement (GLUCERNA SHAKE) Liqd Take 237 mLs by mouth 3 (three) times daily between meals.   gabapentin  100 MG capsule Commonly known as: NEURONTIN  Take 1 capsule (100 mg total) by mouth at bedtime.   hydrOXYzine  25 MG tablet Commonly known as: ATARAX  Take 1 tablet (25 mg total) by mouth 3 (three) times daily as needed for itching.   insulin  glargine-yfgn 100 UNIT/ML injection Commonly known as: SEMGLEE  Inject 0.1 mLs (10 Units total) into the skin daily.   iron  polysaccharides 150 MG capsule Commonly known as: NIFEREX Take 1 capsule (150 mg total) by mouth daily.   levothyroxine  88 MCG tablet Commonly known as: SYNTHROID  TAKE 1 TABLET EVERY DAY ON EMPTY STOMACHWITH A GLASS OF WATER AT LEAST 30-60 MINBEFORE BREAKFAST   lidocaine  5 % Commonly known as: LIDODERM  Place 1 patch onto the skin daily. Remove & Discard patch within 12 hours or as directed by MDApply to left hip Apply to intact skin to cover the most painful area. Apply the prescribed number of patches (maximum of 3), for up to 12 hours within a 24 hour period.   methocarbamol  500 MG tablet Commonly known  as: ROBAXIN  Take 1 tablet (500 mg total) by mouth every 8 (eight) hours as needed for up to 5 days for muscle spasms.   metoprolol  tartrate 50 MG tablet Commonly known as: LOPRESSOR  Take 1 tablet (50 mg total) by mouth 2 (two) times daily.   midodrine  5 MG tablet Commonly known as: PROAMATINE  Take 1 tablet (5 mg total) by mouth 3 (three) times daily with meals. Do NOT hold for dialysis. GIVE DOSE DURING DAYLIGHT HOURS ONLY   multivitamin with minerals Tabs tablet Take 1 tablet by mouth daily.   nitroGLYCERIN  0.4 MG SL tablet Commonly known as: NITROSTAT  Place 1 tablet (0.4 mg total) under the tongue every 5 (five) minutes as needed for chest pain.   oxyCODONE -acetaminophen  5-325 MG tablet Commonly known as: PERCOCET/ROXICET Take 0.5-1 tablets by mouth every 4 (four) hours as needed for moderate pain (pain score 4-6).   Ozempic  (1 MG/DOSE) 2 MG/1.5ML Sopn Generic drug: Semaglutide  (1 MG/DOSE) Inject 1 mg into the skin once a week. What changed: Another medication with the same name was removed. Continue taking this medication, and follow the directions you see here.   polyethylene glycol 17 g packet Commonly known as: MIRALAX  / GLYCOLAX  Take 17 g by mouth daily. Skip the dose if no constipation. Dissolve 17 grams in 4 ounces of water.   pravastatin  20 MG tablet Commonly known as: PRAVACHOL  Take 1 tablet (20 mg total) by mouth at bedtime.   spironolactone  25 MG tablet Commonly known as: ALDACTONE  Take 0.5 tablets (12.5 mg total) by mouth daily.   torsemide  20 MG tablet Commonly known as: DEMADEX  Take 1 tablet (20 mg total) by mouth daily as  needed. For leg edema or sob   venlafaxine  XR 75 MG 24 hr capsule Commonly known as: EFFEXOR -XR TAKE 1 CAPSULE BY MOUTH ONCE DAILY.   Vitamin D  (Ergocalciferol ) 1.25 MG (50000 UNIT) Caps capsule Commonly known as: DRISDOL  Take 1 capsule (50,000 Units total) by mouth every 7 (seven) days for 4 doses. Start taking on: July 15, 2024                Discharge Care Instructions  (From admission, onward)           Start     Ordered   07/14/24 0000  Discharge wound care:       Comments: Please apply honeycomb dressing to left hip prior to discharge   07/14/24 0940            Follow-up Information     Verlinda Boas, PA-C Follow up in 2 week(s).   Specialty: Orthopedic Surgery Why: For staple removal and x-rays of the left hip Contact information: [address] [place] [address] [phone]         Herold Hadassah SQUIBB, MD Follow up in 1 week(s).   Specialty: Family Medicine Contact information: [address] [place] [address] [phone]         Perla Evalene PARAS, MD. Schedule an appointment as soon as possible for a visit in 2 week(s).   Specialty: Cardiology Why: for atrial fib followup Contact information: [address] [place] [address] [phone]                  Time coordinating discharge: 39 minutes  Signed:  Chanice Brenton  [facility] 07/14/2024, 9:45 AM

## 2024-07-15 ENCOUNTER — Telehealth: Payer: Self-pay

## 2024-07-15 DIAGNOSIS — Z8781 Personal history of (healed) traumatic fracture: Secondary | ICD-10-CM | POA: Insufficient documentation

## 2024-07-16 NOTE — Transitions of Care (Post Inpatient/ED Visit) (Signed)
   07/16/2024  Name: Alison Richardson MRN: [id number] DOB: June 11, 1933  Today's TOC FU Call Status: Today's TOC FU Call Status:: Unsuccessful Call (1st Attempt) Unsuccessful Call (1st Attempt) Date: 07/16/24  Attempted to reach the patient regarding the most recent Inpatient/ED visit.  Follow Up Plan: Additional outreach attempts will be made to reach the patient to complete the Transitions of Care (Post Inpatient/ED visit) call.

## 2024-07-20 NOTE — Progress Notes (Signed)
 Alison Richardson is a 89 y.o. female seen for follow up visit at [facility] place  CHIEF COMPLAINT:  Follow up medical problems    HISTORY OF PRESENT ILLNESS:  Alison Richardson has been having an itch on her back, rash spots also. More upper  Also some confusion re ozempic  dose, pt doesn't think she used but is on her med list at [facility] and primary care.      No fever chills sweats are noted, no nausea vomiting or diarrhea have been noted, no chest pain or sob either   Past Medical History:  Diagnosis Date  . Cerebral aneurysm, nonruptured (HHS-HCC)   . CHF (congestive heart failure) (CMS/HHS-HCC)   . Diabetes (CMS/HHS-HCC)   . GERD (gastroesophageal reflux disease)   . Major depression in remission (CMS-HCC) 06/18/2018  . Paroxysmal atrial fibrillation (CMS/HHS-HCC) 06/18/2018  . Peripheral neuropathy, idiopathic 09/08/2018  . Primary osteoarthritis involving multiple joints 06/18/2018  . Type 2 diabetes mellitus with diabetic neuropathy, without long-term current use of insulin  (CMS/HHS-HCC) 06/18/2018    Past Surgical History:  Procedure Laterality Date  . REPLACEMENT TOTAL KNEE Left 2017  . melanoma removal  12/2018   Dr. Dela  . COLONOSCOPY  08/19/2020   Negative colon biopsy/No Repeat/CTL  . EGD  08/19/2020   Fundic gland polyp/No Repeat/CTL  . ABDOMINAL HYSTERECTOMY    . APPENDECTOMY    . CATARACT EXTRACTION Bilateral   . JOINT REPLACEMENT    . LAPAROSCOPIC REMOVAL ECTOPIC PREGNANCY    . TONSILLECTOMY    . TONSILLECTOMY        Social History   Socioeconomic History  . Marital status: Married  Tobacco Use  . Smoking status: Never  . Smokeless tobacco: Never  Vaping Use  . Vaping status: Never Used  Substance and Sexual Activity  . Alcohol use: Yes    Comment: Gin  . Drug use: No   Social Drivers of Corporate investment banker Strain: Low Risk  (05/20/2024)   Received from [facility]   Overall Financial Resource Strain (CARDIA)   . Difficulty of  Paying Living Expenses: Not hard at all  Food Insecurity: No Food Insecurity (07/05/2024)   Received from [facility]   Hunger Vital Sign   . Within the past 12 months, you worried that your food would run out before you got the money to buy more.: Never true   . Within the past 12 months, the food you bought just didn't last and you didn't have money to get more.: Never true  Transportation Needs: No Transportation Needs (07/05/2024)   Received from [facility] - Transportation   . In the past 12 months, has lack of transportation kept you from medical appointments or from getting medications?: No   . In the past 12 months, has lack of transportation kept you from meetings, work, or from getting things needed for daily living?: No  Physical Activity: Insufficiently Active (04/09/2023)   Received from [facility]   Exercise Vital Sign   . On average, how many days per week do you engage in moderate to strenuous exercise (like a brisk walk)?: 4 days   . On average, how many minutes do you engage in exercise at this level?: 20 min  Stress: No Stress Concern Present (04/09/2023)   Received from [facility] - Occupational Stress Questionnaire   . Feeling of Stress : Not at all  Social Connections: Socially Isolated (07/05/2024)  Received from [facility]   Social Connection and Isolation Panel   . In a typical week, how many times do you talk on the phone with family, friends, or neighbors?: More than three times a week   . How often do you get together with friends or relatives?: More than three times a week   . How often do you attend church or religious services?: Never   . Do you belong to any clubs or organizations such as church groups, unions, fraternal or athletic groups, or school groups?: No   . How often do you attend meetings of the clubs or organizations you belong to?: Never   . Are you married, widowed, divorced, separated, never  married, or living with a partner?: Widowed  Housing Stability: Low Risk  (03/26/2024)   Received from [facility] Stability Vital Sign   . Unable to Pay for Housing in the Last Year: No   . Number of Times Moved in the Last Year: 0   . Homeless in the Last Year: No     Medication reviewed and signed on the nursing facility chart as appropriate   No acute distress No icterus  No JVD Lungs; clear to ascultation Heart; Regular rate and rhythm  Abdomen; Soft and flat, normal bowel sounds Extremities; No clubbing, cyanosis or edema  Available labs reviewed in the nursing home chart.   ASSESSMENT  AND PLAN: Diagnoses and all orders for this visit:  Heat rash  Late onset Alzheimer's disease without behavioral disturbance (CMS/HHS-HCC)  Type 2 diabetes mellitus with diabetic neuropathy, without long-term current use of insulin  (CMS/HHS-HCC)    Use triamcinolone  ointment for her back rash. Will try to get her family and primary care doctor to clarify re ozempic  use or absence of use

## 2024-07-21 ENCOUNTER — Ambulatory Visit: Admitting: Cardiology

## 2024-07-22 ENCOUNTER — Telehealth: Payer: Self-pay | Admitting: Pediatrics

## 2024-07-22 NOTE — Telephone Encounter (Signed)
 Copied from CRM [id number]. Topic: Medicare AWV >> Jul 22, 2024  1:04 PM Nathanel DEL wrote: Reason for CRM: Called 07/22/2024 to sched AWV - MAILBOX FULL  Nathanel Paschal; Care Guide Ambulatory Clinical Support [facility] [place] [facility] Direct Dial: [phone]

## 2024-07-29 DIAGNOSIS — M25552 Pain in left hip: Secondary | ICD-10-CM | POA: Diagnosis not present

## 2024-07-29 DIAGNOSIS — Z9889 Other specified postprocedural states: Secondary | ICD-10-CM | POA: Diagnosis not present

## 2024-07-29 DIAGNOSIS — Z8781 Personal history of (healed) traumatic fracture: Secondary | ICD-10-CM | POA: Diagnosis not present

## 2024-07-29 DIAGNOSIS — E1140 Type 2 diabetes mellitus with diabetic neuropathy, unspecified: Secondary | ICD-10-CM | POA: Diagnosis not present

## 2024-08-03 DIAGNOSIS — I4892 Unspecified atrial flutter: Secondary | ICD-10-CM | POA: Diagnosis not present

## 2024-08-03 DIAGNOSIS — N390 Urinary tract infection, site not specified: Secondary | ICD-10-CM | POA: Diagnosis not present

## 2024-08-03 DIAGNOSIS — I4891 Unspecified atrial fibrillation: Secondary | ICD-10-CM | POA: Diagnosis not present

## 2024-08-03 DIAGNOSIS — R310 Gross hematuria: Secondary | ICD-10-CM | POA: Diagnosis not present

## 2024-08-04 DIAGNOSIS — Z515 Encounter for palliative care: Secondary | ICD-10-CM | POA: Diagnosis not present

## 2024-08-04 DIAGNOSIS — I739 Peripheral vascular disease, unspecified: Secondary | ICD-10-CM | POA: Diagnosis not present

## 2024-08-04 DIAGNOSIS — R319 Hematuria, unspecified: Secondary | ICD-10-CM | POA: Diagnosis not present

## 2024-08-04 DIAGNOSIS — F0390 Unspecified dementia without behavioral disturbance: Secondary | ICD-10-CM | POA: Diagnosis not present

## 2024-08-04 DIAGNOSIS — I509 Heart failure, unspecified: Secondary | ICD-10-CM | POA: Diagnosis not present

## 2024-08-04 DIAGNOSIS — R54 Age-related physical debility: Secondary | ICD-10-CM | POA: Diagnosis not present

## 2024-08-04 DIAGNOSIS — R0602 Shortness of breath: Secondary | ICD-10-CM | POA: Diagnosis not present

## 2024-08-04 DIAGNOSIS — R531 Weakness: Secondary | ICD-10-CM | POA: Diagnosis not present

## 2024-08-05 ENCOUNTER — Encounter: Payer: Self-pay | Admitting: Cardiology

## 2024-08-05 ENCOUNTER — Ambulatory Visit: Attending: Cardiology | Admitting: Cardiology

## 2024-08-05 VITALS — BP 90/50 | HR 103 | Ht 66.0 in | Wt 157.0 lb

## 2024-08-05 DIAGNOSIS — E1169 Type 2 diabetes mellitus with other specified complication: Secondary | ICD-10-CM

## 2024-08-05 DIAGNOSIS — I340 Nonrheumatic mitral (valve) insufficiency: Secondary | ICD-10-CM | POA: Diagnosis not present

## 2024-08-05 DIAGNOSIS — E785 Hyperlipidemia, unspecified: Secondary | ICD-10-CM

## 2024-08-05 DIAGNOSIS — I071 Rheumatic tricuspid insufficiency: Secondary | ICD-10-CM

## 2024-08-05 DIAGNOSIS — Z8781 Personal history of (healed) traumatic fracture: Secondary | ICD-10-CM

## 2024-08-05 DIAGNOSIS — I2510 Atherosclerotic heart disease of native coronary artery without angina pectoris: Secondary | ICD-10-CM | POA: Diagnosis not present

## 2024-08-05 DIAGNOSIS — I959 Hypotension, unspecified: Secondary | ICD-10-CM | POA: Diagnosis not present

## 2024-08-05 DIAGNOSIS — Z9889 Other specified postprocedural states: Secondary | ICD-10-CM

## 2024-08-05 DIAGNOSIS — I480 Paroxysmal atrial fibrillation: Secondary | ICD-10-CM

## 2024-08-05 DIAGNOSIS — I5032 Chronic diastolic (congestive) heart failure: Secondary | ICD-10-CM | POA: Diagnosis not present

## 2024-08-05 DIAGNOSIS — I4892 Unspecified atrial flutter: Secondary | ICD-10-CM

## 2024-08-05 NOTE — Progress Notes (Signed)
 Cardiology Office Note   Date:  08/05/2024  ID:  Alison Richardson, DOB 12-20-1933, MRN [id number] PCP: Herold Hadassah SQUIBB, MD  [facility] Providers Cardiologist:  Evalene Lunger, MD     History of Present Illness Alison Richardson is a 89 y.o. female with a past medical history of coronary artery disease being medically managed, persistent atrial fibrillation, HFpEF, valvular heart disease, type 2 diabetes, hypertension, hyperlipidemia, and hypothyroidism, who presents today for follow-up.   She previously been followed by [facility] cardiology.  Recommend imaging outlined below.  She underwent left heart catheterization in 09/2021 which showed ostial proximal RCA stenosis 99%, distal RCA stenosis 65%, RPDA stenosis 60%, D1 stenosis 90%, ostial left circumflex to proximal left circumflex 25% stenosis, and ostial LAD to proximal LAD 25% stenosis.  LV systolic function estimated at 35-45%.  Given complex lesions that were not ideal for PCI, medical management was recommended.  Echocardiogram in 10/2021 showed an EF of 45-50%, no RWMA, normal LV diastolic function, normal RV systolic function and size, mild mitral regurgitation.  Follow-up echoes have demonstrated normalization of LV systolic function.  She establish care with Dr. Gollan in 11/2023.  Echocardiogram in 12/2023 showed an EF of 60 to 65%, no RWMA, normal RV systolic function and size, mildly elevated PASP.  Mildly to moderately mitral regurgitation and mild to moderate tricuspid regurgitation.  She was admitted to [facility] 03/2024 with progressive dyspnea treated for acute hypoxic respiratory failure with sepsis secondary to pneumonia.  Was evaluated at her PCPs office on 05/05/2024 for routine follow-up.  Blood pressure was soft at 93/57.  Valsartan  was discontinued.  She was started on Ozempic .  She had recently been admitted from 5/16 - 05/09/2024 with continued lightheadedness and dizziness with positional changes.  Blood pressure 101/59.   High-sensitivity troponin negative x 2.  Chest x-ray with bibasilar linear opacities left greater than right felt to favor atelectasis with stable cardiomegaly.  She received IV fluids with recommendation to discontinue diltiazem .  Uptitrating home blood pressure metoprolol  was reinitiated.  Evaluated by her PCP 05/12/2024 with reported episode of chest discomfort.  Blood pressure improved and was 123/73.  She did feel like abdominal was distended and bloated.  She was advised to take a lower dose of Toprol -XL 12.5 mg daily and torsemide  for weight gain.  She was continued on Ozempic .  She was admitted to [facility] on 05/13/2024 with an illness and onset of shortness of breath when lying down.  On arrival EMS had given Solu-Medrol  DuoNebs as she was found to be hypoxic with oxygen saturations of 87% on 4 L.  She was transitioned from nasal cannula to BiPAP.  High-sensitivity troponin 14 with a delta troponin of 108.  D-dimer 1.2, lactic acid 2.8 trending to 3.1, BNP of 1890, hemoglobin 12.3, WBCs of 24.4.  COVID, influenza, and RSV were negative.  Chest x-ray with interval development of interstitial pulmonary edema versus stable cardiomegaly.  She was given nitro and subsequently transition to nitro drip as well as IV Lasix  40 mg, and heparin  infusion.  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  Ultimately amiodarone  was discontinued.  She underwent PCI on 5/27 with 2 DES placed to the RCA.  Started on aspirin , clopidogrel , and apixaban  with plans to discontinue aspirin  after 1 week.  She was discharged home with home health on 05/20/2024.  She was evaluated by advanced heart failure 06/04/2024 with a chief complaint of fatigue.  Associated shortness of breath, cough, runny nose, back pain.  She had had side effects with SGLT2 inhibitors in the past but could not remember the side effect.  There was no escalation of GDMT but she was sent for labs.  With low blood pressures and  orthostatic symptoms she would likely not be able to tolerate MRA or beta-blocker therapy.   She was last seen in clinic 06/01/2024 stated overall she been doing well since being discharged on hospital.  She continues to suffer from chronic shortness of breath but states it was little worse prior to her hospitalization.  She did not miss any doses of her apixaban  and had a drop in her aspirin  therapy 7 days after her hospitalization.  Recent hospitalization from 7/12 - 07/14/2024 for closed left hip fracture after mechanical fall which required ORIF on 07/06/2024.  She was evaluated by her team during recent hospitalization and cleared for surgery.  She did have atrial fibrillation with RVR was continued on her apixaban  and metoprolol  and amiodarone  was discontinued on 7/17 due to prolonged QTc.  Manage and had to be initiated on 7/16.  Rate control has been challenging due to intolerance of amiodarone  with QT prolongation.  Digoxin  had been discontinued.  She eventually ended up on metoprolol  50 mg twice daily and apixaban  with the dose of midodrine  being decreased.  She was to be discharged with clopidogrel  and Eliquis  on DC.  She did receive 1 unit of packed RBCs during hospitalization with a hemoglobin of 9.9 postoperatively.  Was also started on oral iron  supplements.  She was considered stable for discharge and was discharged to [facility] 07/14/24.  She returns to clinic today accompanied by her son.  She states that she has been doing well.  She has been working with therapy and her hip is improved.  She has continued on her current medication regimen without any undue side effects.  Denies any bleeding with no blood noted in her urine or stool.  Son was concerned as he stated that the facility was concerned with her atrial fibrillation and atrial flutter that they have noticed heart rates up into the 130s.  States that she was recently diagnosed with a urinary tract infection and is being treated for that as  well.  She has not had any labs done since her discharge from the hospital.  ROS: 10 point review of system has been reviewed and considered negative except what is listed in the HPI  Studies Reviewed EKG Interpretation Date/Time:  Wednesday August 05 2024 10:51:08 EDT Ventricular Rate:  103 PR Interval:    QRS Duration:  102 QT Interval:  342 QTC Calculation: 448 R Axis:   -63  Text Interpretation: Atrial flutter with variable A-V block with premature ventricular or aberrantly conducted complexes Left axis deviation Inferior infarct , age undetermined Anterolateral infarct (cited on or before 10-Jul-2024) When compared with ECG of 11-Jul-2024 05:54, No significant change was found Confirmed by Gerard Frederick ([id number]) on 08/05/2024 11:15:13 AM    2D limited echo 06/18/24 1. Left ventricular ejection fraction, by estimation, is 60 to 65%. The  left ventricle has normal function. The left ventricle has no regional  wall motion abnormalities. Left ventricular diastolic parameters were  normal.   2. Right ventricular systolic function is normal. The right ventricular  size is normal. There is mildly elevated pulmonary artery systolic  pressure. The estimated right ventricular systolic pressure is 43.2 mmHg.   3. The mitral valve is normal in structure. Mild to moderate mitral valve  regurgitation. No evidence of  mitral stenosis.   4. Tricuspid valve regurgitation is moderate.   5. The aortic valve is tricuspid. Aortic valve regurgitation is not  visualized. No aortic stenosis is present.   6. The inferior vena cava is normal in size with greater than 50%  respiratory variability, suggesting right atrial pressure of 3 mmHg.   LHC 05/19/2024 Conclusions: Relatively stable appearance of severe two-vessel CAD, including chronic total occlusion of D1 branch and sequential 99% proximal, 40-50% mid, and 70-80% distal RCA stenoses.  There are also moderate, nonobstructive lesions in the  ostial/proximal LAD, proximal/mid LCx, and RPDA/RPAV branches. Normal left and right heart filling pressures (mean RA 4, RV 40/5, PCWP 15, LVEDP 15 mmHg).  Prominent V waves noted on PCWP tracing suggest significant mitral regurgitation. Mild pulmonary hypertension (PA 36/15, mean 22 mmHg). Normal Fick cardiac output/index (CO 4.9 L/min, CI 2.7 L/min/m). Successful PCI to proximal and distal RCA stenoses using nonoverlapping Onyx Frontier 3.0 x 15 mm (proximal) and 2.75 x 15 mm (distal) drug-eluting stents with 0% residual stenosis and TIMI-3 flow.   Recommendations: If no evidence of bleeding or vascular injury, resume apixaban  5 mg twice daily this evening.  Anticipate therapy with apixaban , clopidogrel , and aspirin  for 1 week, after which time aspirin  can be stopped.  Continue apixaban  and clopidogrel  therapy for up to 12 months, as tolerated (could switch clopidogrel  to aspirin  at 6 months if there is concern for high bleeding risk). Maintain net even fluid balance. Escalate goal-directed medical therapy as tolerated. Aggressive secondary prevention of coronary artery disease.   2D echo 05/14/2024 1. Left ventricular ejection fraction, by estimation, is 40 to 45%. The  left ventricle has mild to moderately decreased function. The left  ventricle demonstrates global hypokinesis. Left ventricular diastolic  parameters are indeterminate.   2. Right ventricular systolic function is normal. The right ventricular  size is moderately enlarged. There is moderately elevated pulmonary artery  systolic pressure. The estimated right ventricular systolic pressure is  54.3 mmHg.   3. The mitral valve is normal in structure. Mild mitral valve  regurgitation.   4. Tricuspid valve regurgitation is mild to moderate.   5. The aortic valve is tricuspid. Aortic valve regurgitation is not  visualized.   6. The inferior vena cava is normal in size with greater than 50%  respiratory variability, suggesting  right atrial pressure of 3 mmHg.    2D echo 12/31/2023: 1. Left ventricular ejection fraction, by estimation, is 60 to 65%. The  left ventricle has normal function. The left ventricle has no regional  wall motion abnormalities. Left ventricular diastolic parameters are  indeterminate.   2. Right ventricular systolic function is normal. The right ventricular  size is normal. There is mildly elevated pulmonary artery systolic  pressure. The estimated right ventricular systolic pressure is 40.8 mmHg.   3. Left atrial size was mild to moderately dilated.   4. The mitral valve is normal in structure. Mild to moderate mitral valve  regurgitation. No evidence of mitral stenosis.   5. Tricuspid valve regurgitation is mild to moderate.   6. The aortic valve has an indeterminant number of cusps. Aortic valve  regurgitation is not visualized. No aortic stenosis is present.   7. The inferior vena cava is normal in size with greater than 50%  respiratory variability, suggesting right atrial pressure of 3 mmHg.    2D echo 04/21/2023: 1. Left ventricular ejection fraction, by estimation, is 55 to 60%. Left  ventricular ejection fraction by PLAX is 67 %.  The left ventricle has  normal function. The left ventricle has no regional wall motion  abnormalities. Left ventricular diastolic  parameters are indeterminate.   2. Right ventricular systolic function is normal. The right ventricular  size is normal. There is mildly elevated pulmonary artery systolic  pressure. The estimated right ventricular systolic pressure is 38.3 mmHg.   3. The mitral valve is normal in structure. Moderate mitral valve  regurgitation. No evidence of mitral stenosis.   4. Tricuspid valve regurgitation is moderate to severe.   5. The aortic valve is tricuspid. Aortic valve regurgitation is not  visualized. No aortic stenosis is present.   6. The inferior vena cava is normal in size with greater than 50%  respiratory variability,  suggesting right atrial pressure of 3 mmHg.    2D echo 12/11/2022: 1. Left ventricular ejection fraction, by estimation, is 50 to 55%. The  left ventricle has low normal function. The left ventricle has no regional  wall motion abnormalities. Left ventricular diastolic parameters were  normal.   2. Right ventricular systolic function is normal. The right ventricular  size is normal.   3. The mitral valve is normal in structure. Mild mitral valve  regurgitation. No evidence of mitral stenosis.   4. The aortic valve is normal in structure. Aortic valve regurgitation is  not visualized. No aortic stenosis is present.   5. The inferior vena cava is normal in size with greater than 50%  respiratory variability, suggesting right atrial pressure of 3 mmHg.    2D echo 10/31/2021: 1. Left ventricular ejection fraction, by estimation, is 45 to 50%. The  left ventricle has mildly decreased function. The left ventricle has no  regional wall motion abnormalities. Left ventricular diastolic parameters  were normal.   2. Right ventricular systolic function is normal. The right ventricular  size is normal.   3. The mitral valve is normal in structure. Mild mitral valve  regurgitation. No evidence of mitral stenosis.   4. The aortic valve is normal in structure. Aortic valve regurgitation is  not visualized. No aortic stenosis is present.   5. The inferior vena cava is normal in size with greater than 50%  respiratory variability, suggesting right atrial pressure of 3 mmHg.    LHC 10/12/2021:   Ost RCA to Prox RCA lesion is 99% stenosed.   Dist RCA lesion is 65% stenosed.   RPDA lesion is 60% stenosed.   1st Diag lesion is 99% stenosed.   Ost Cx to Prox Cx lesion is 25% stenosed.   Ost LAD to Prox LAD lesion is 25% stenosed.   There is mild left ventricular systolic dysfunction.   LV end diastolic pressure is mildly elevated.   The left ventricular ejection fraction is 35-45% by visual  estimate.   89 year old female with known chronic nonvalvular atrial fibrillation hypertension hyperlipidemia with acute non-ST elevation myocardial infarction   Cardiac catheterization showing mild inferior hypokinesis and apical hypokinesis with ejection fraction of 40 to 45%   Severe and/or critical stenosis of ostial right coronary artery and moderate atherosclerosis of distal right coronary artery and PDA complex in nature with good collaterals from left anterior descending artery Mild atherosclerosis of left anterior descending artery and circumflex artery   After discussion with cardiovascular team would best be medically manage at this time due to complex lesions and not ideal for PCI and stent placement   Plan Isosorbide  for better collateral flow 2.  Increase beta-blocker for better heart rate control of atrial  fibrillation with a goal heart rate between 60 and 90 bpm 3.  Reinstate anticoagulation for further risk reduction and stroke with atrial fibrillation 4.  Single antiplatelet therapy with Plavix  5.  Hypertension control and high intensity cholesterol therapy 6.  Cardiac rehabilitation   2D echo 10/07/2020: 1. Left ventricular ejection fraction, by estimation, is 60 to 65%. The  left ventricle has normal function. The left ventricle has no regional  wall motion abnormalities. Left ventricular diastolic parameters are  consistent with Grade I diastolic  dysfunction (impaired relaxation).   2. Right ventricular systolic function is normal. The right ventricular  size is normal.   3. The mitral valve is grossly normal. Mild mitral valve regurgitation.   4. The aortic valve is normal in structure. Aortic valve regurgitation is  trivial.    2D echo 09/29/2019: 1. Left ventricular ejection fraction, by visual estimation, is 60 to  65%. The left ventricle has normal function. Left ventricular septal wall  thickness was mildly increased. Mildly increased left ventricular   posterior wall thickness. There is mildly  increased left ventricular hypertrophy.   2. Global right ventricle has normal systolic function.The right  ventricular size is mildly enlarged. No increase in right ventricular wall  thickness.   3. Left atrial size was mildly dilated.   4. Right atrial size was normal.   5. The mitral valve is grossly normal. Mild mitral valve regurgitation.   6. The tricuspid valve is grossly normal. Tricuspid valve regurgitation  is mild.   7. The aortic valve is tricuspid Aortic valve regurgitation was not  visualized by color flow Doppler.   8. The pulmonic valve was not well visualized. Pulmonic valve  regurgitation is trivial by color flow Doppler.   9. Moderately elevated pulmonary artery systolic pressure.  10. No shunts by color flow doppler.  Risk Assessment/Calculations  CHA2DS2-VASc Score = 7   This indicates a 11.2% annual risk of stroke. The patient's score is based upon: CHF History: 1 HTN History: 1 Diabetes History: 1 Stroke History: 0 Vascular Disease History: 1 Age Score: 2 Gender Score: 1            Physical Exam VS:  BP (!) 90/50 (BP Location: Left Arm, Patient Position: Sitting, Cuff Size: Normal)   Pulse (!) 103   Ht 5' 6 (1.676 m)   Wt 157 lb (71.2 kg)   SpO2 97%   BMI 25.34 kg/m        Wt Readings from Last 3 Encounters:  08/05/24 157 lb (71.2 kg)  07/07/24 162 lb 14.7 oz (73.9 kg)  06/01/24 165 lb 6.4 oz (75 kg)    GEN: Well nourished, well developed in no acute distress NECK: No JVD; No carotid bruits CARDIAC: IR IR, no murmurs, rubs, gallops RESPIRATORY:  Clear to auscultation without rales, wheezing or rhonchi  ABDOMEN: Soft, non-tender, non-distended EXTREMITIES:  No edema; No deformity   ASSESSMENT AND PLAN Coronary artery disease involving native coronary arteries with prior NSTEMI.  Cardiac catheterization showed stable coronary anatomy with CTO of the small diagonal branch with severe proximal  distal RCA and antegrade collateral flow.  She had 1 successful PCI/DES to the proximal distal RCA.  She continued on triple therapy for 7 days and then continued on apixaban  5 mg twice daily and clopidogrel  for 75 mg for minimum of 12 months.  If there were concerns for higher risk of bleeding clopidogrel  could be switched back to aspirin  in 6 months.  EKG today reveals atrial  flutter that is rate controlled with a rate of 103 with varying AV block and PVCs versus aberrantly conducted beats with a left axis deviation.  She is continued on pravastatin  20 mg daily.  Given advanced age and concern for side effects she was not switched intensity statin.  Chronic HFimpEF, last echocardiogram revealed an LVEF of 60 to 65%.  She is continued on metoprolol  50 mg twice daily, losartan  had to be recently discontinued due to hypotension and she was started on midodrine  torsemide  20 mg daily, spironolactone  12.5 mg daily.  Currently unable to escalate GDMT due to blood pressure.  Patient appears to be euvolemic on exam today and is appropriate NYHA class I-II symptoms on occasion.  Paroxysmal atrial fibrillation with atrial flutter with varying AV block noted on EKG today.  She has been continued on apixaban  5 mg twice daily for CHA2DS2-VASc score of at least 7 for stroke prophylaxis.  Previously was on amiodarone  but was discontinued for concern of possible amiodarone  lung toxicity and QT prolongation.  She is continued on metoprolol  50 mg twice daily as well with an additional 25 mg as needed for elevated heart rates of greater than 120 and systolic blood pressure greater than 90 with these directions given to the facility.  Valvular heart disease with recent echocardiogram continue to reveal mitral and tricuspid regurgitation was unchanged.  Will continue to follow with surveillance studies as she remains asymptomatic.  Hypotension with a history of hypertension now off of all antihypertensive medications with the  exception of metoprolol  tartrate and continued on midodrine  5 mg 3 times daily.  Monitor blood pressure 1 to 2 hours postmedication administration.  Type 2 diabetes with ongoing management by her PCP.  Mechanical fall status post right hip fracture status post ORIF of the hip.  Continues in SNF undergoing therapy with ongoing management by orthopedics.  She had to be transfused 1 unit of packed RBCs during hospitalization we will repeat CBC and be met today.       Dispo: Patient to return to clinic to see MD/APP in 3 months or sooner if needed for further evaluation.  Signed, Willodene Stallings, NP

## 2024-08-05 NOTE — Patient Instructions (Signed)
 Medication Instructions:  Your physician recommends that you continue on your current medications as directed. Please refer to the Current Medication list given to you today.   *If you need a refill on your cardiac medications before your next appointment, please call your pharmacy*  Lab Work: Your provider would like for you to have following labs drawn today CBC, BMP.   If you have labs (blood work) drawn today and your tests are completely normal, you will receive your results only by: MyChart Message (if you have MyChart) OR A paper copy in the mail If you have any lab test that is abnormal or we need to change your treatment, we will call you to review the results.  Testing/Procedures: No test ordered today   Follow-Up: At [facility], you and your health needs are our priority.  As part of our continuing mission to provide you with exceptional heart care, our providers are all part of one team.  This team includes your primary Cardiologist (physician) and Advanced Practice Providers or APPs (Physician Assistants and Nurse Practitioners) who all work together to provide you with the care you need, when you need it.  Your next appointment:   3 month(s)  Provider:   Timothy Gollan, MD or Tylene Lunch, NP

## 2024-08-06 ENCOUNTER — Ambulatory Visit: Payer: Self-pay | Admitting: Cardiology

## 2024-08-06 LAB — BASIC METABOLIC PANEL WITH GFR
BUN/Creatinine Ratio: 21 (ref 12–28)
BUN: 16 mg/dL (ref 8–27)
CO2: 18 mmol/L — ABNORMAL LOW (ref 20–29)
Calcium: 9.7 mg/dL (ref 8.7–10.3)
Chloride: 104 mmol/L (ref 96–106)
Creatinine, Ser: 0.77 mg/dL (ref 0.57–1.00)
Glucose: 213 mg/dL — ABNORMAL HIGH (ref 70–99)
Potassium: 4.8 mmol/L (ref 3.5–5.2)
Sodium: 139 mmol/L (ref 134–144)
eGFR: 74 mL/min/1.73 (ref >59)

## 2024-08-06 LAB — CBC
Hematocrit: 44.5 % (ref 34.0–46.6)
Hemoglobin: 13.4 g/dL (ref 11.1–15.9)
MCH: 26.5 pg — ABNORMAL LOW (ref 26.6–33.0)
MCHC: 30.1 g/dL — ABNORMAL LOW (ref 31.5–35.7)
MCV: 88 fL (ref 79–97)
Platelets: 321 x10E3/uL (ref 150–450)
RBC: 5.05 x10E6/uL (ref 3.77–5.28)
RDW: 20.8 % — ABNORMAL HIGH (ref 11.7–15.4)
WBC: 10.1 x10E3/uL (ref 3.4–10.8)

## 2024-08-06 NOTE — Progress Notes (Signed)
 Kidney function and potassium remain stable.  Hemoglobin has improved from 10-13.4.  No current changes needed to medication regimen at this time.

## 2024-08-07 DIAGNOSIS — J4540 Moderate persistent asthma, uncomplicated: Secondary | ICD-10-CM | POA: Diagnosis not present

## 2024-08-13 ENCOUNTER — Other Ambulatory Visit: Payer: Self-pay

## 2024-08-13 DIAGNOSIS — R062 Wheezing: Secondary | ICD-10-CM | POA: Diagnosis not present

## 2024-08-13 DIAGNOSIS — J069 Acute upper respiratory infection, unspecified: Secondary | ICD-10-CM | POA: Diagnosis not present

## 2024-08-13 NOTE — Progress Notes (Signed)
 Alison Richardson is a 89 y.o. female seen for follow up visit at [facility]  CHIEF COMPLAINT:  Follow up medical problems    HISTORY OF PRESENT ILLNESS:  Alison Richardson has been coughing and having mild malaise for 2-3 days. Wheezing was noted by family and nursing last pm. No ear ache or cp or sob   No fever chills sweats are noted, no nausea vomiting or diarrhea have been noted, no chest pain or sob either   Past Medical History:  Diagnosis Date  . Cerebral aneurysm, nonruptured (HHS-HCC)   . CHF (congestive heart failure) (CMS/HHS-HCC)   . Diabetes (CMS/HHS-HCC)   . GERD (gastroesophageal reflux disease)   . Major depression in remission () 06/18/2018  . Paroxysmal atrial fibrillation (CMS/HHS-HCC) 06/18/2018  . Peripheral neuropathy, idiopathic 09/08/2018  . Primary osteoarthritis involving multiple joints 06/18/2018  . Type 2 diabetes mellitus with diabetic neuropathy, without long-term current use of insulin  (CMS/HHS-HCC) 06/18/2018    Past Surgical History:  Procedure Laterality Date  . REPLACEMENT TOTAL KNEE Left 2017  . melanoma removal  12/2018   Dr. Dela  . COLONOSCOPY  08/19/2020   Negative colon biopsy/No Repeat/CTL  . EGD  08/19/2020   Fundic gland polyp/No Repeat/CTL  . FRACTURE SURGERY Left 07/06/2024   Left hip fracture, replacement  . ABDOMINAL HYSTERECTOMY    . APPENDECTOMY    . CATARACT EXTRACTION Bilateral   . JOINT REPLACEMENT    . LAPAROSCOPIC REMOVAL ECTOPIC PREGNANCY    . TONSILLECTOMY    . TONSILLECTOMY        Social History   Socioeconomic History  . Marital status: Married  Tobacco Use  . Smoking status: Never  . Smokeless tobacco: Never  Vaping Use  . Vaping status: Never Used  Substance and Sexual Activity  . Alcohol use: Yes    Comment: Gin  . Drug use: No   Social Drivers of Corporate investment banker Strain: Low Risk  (07/29/2024)   Overall Financial Resource Strain (CARDIA)   . Difficulty of Paying Living Expenses:  Not hard at all  Food Insecurity: No Food Insecurity (07/29/2024)   Hunger Vital Sign   . Worried About Programme researcher, broadcasting/film/video in the Last Year: Never true   . Ran Out of Food in the Last Year: Never true  Transportation Needs: No Transportation Needs (07/29/2024)   PRAPARE - Transportation   . Lack of Transportation (Medical): No   . Lack of Transportation (Non-Medical): No  Physical Activity: Insufficiently Active (04/09/2023)   Received from [facility]   Exercise Vital Sign   . On average, how many days per week do you engage in moderate to strenuous exercise (like a brisk walk)?: 4 days   . On average, how many minutes do you engage in exercise at this level?: 20 min  Stress: No Stress Concern Present (04/09/2023)   Received from [facility] - Occupational Stress Questionnaire   . Feeling of Stress : Not at all  Social Connections: Socially Isolated (07/05/2024)   Received from [facility]   Social Connection and Isolation Panel   . In a typical week, how many times do you talk on the phone with family, friends, or neighbors?: More than three times a week   . How often do you get together with friends or relatives?: More than three times a week   . How often do you attend church or religious services?: Never   .  Do you belong to any clubs or organizations such as church groups, unions, fraternal or athletic groups, or school groups?: No   . How often do you attend meetings of the clubs or organizations you belong to?: Never   . Are you married, widowed, divorced, separated, never married, or living with a partner?: Widowed  Housing Stability: Low Risk  (07/29/2024)   Housing Stability Vital Sign   . Unable to Pay for Housing in the Last Year: No   . Number of Times Moved in the Last Year: 0   . Homeless in the Last Year: No     Medication reviewed and signed on the nursing facility chart as appropriate  view all Weight: 158.7  Lbs 08/13/2024 06:10  laujones (Manual)  view all Blood Pressure: 125 /  80 mmHg 08/13/2024 00:25  /  laujones (Manual)  view all Temperature: 97.5 F 08/13/2024 00:25   laujones (Manual)  Low of 97.8 exceeded view all Pulse: 93 bpm 08/13/2024 00:25   laujones (Manual)  view all Respirations: 18 Breaths/min 08/13/2024 00:25   laujones (Manual)  view all Blood Sugar: 143.0 mg/dL 1/78/7974 93:90   laujones (Manual)  High of 99.0 exceeded view all O2 Saturation: 95.0% 08/13/2024 00:25   laujones (Manual)  view all Height:        view all Pain Level: 2 08/13/2024 00:21   No acute distress No icterus  No JVD Lungs; diffuse minimal rhonchi, no wheezing heard now but nursing reported earlier and nebs given  Heart; Regular rate and rhythm  Abdomen; Soft and flat, normal bowel sounds Extremities; No clubbing, cyanosis or edema  Available labs reviewed in the nursing home chart.   ASSESSMENT  AND PLAN: Diagnoses and all orders for this visit:  Wheezing on auscultation  Upper respiratory tract infection, unspecified type    Check covid and flu swabs and cxr. Advair ordered for now

## 2024-08-14 ENCOUNTER — Other Ambulatory Visit: Payer: Self-pay

## 2024-08-21 DIAGNOSIS — Z515 Encounter for palliative care: Secondary | ICD-10-CM | POA: Diagnosis not present

## 2024-08-21 DIAGNOSIS — I739 Peripheral vascular disease, unspecified: Secondary | ICD-10-CM | POA: Diagnosis not present

## 2024-08-21 DIAGNOSIS — R531 Weakness: Secondary | ICD-10-CM | POA: Diagnosis not present

## 2024-08-21 DIAGNOSIS — I509 Heart failure, unspecified: Secondary | ICD-10-CM | POA: Diagnosis not present

## 2024-08-21 DIAGNOSIS — R0602 Shortness of breath: Secondary | ICD-10-CM | POA: Diagnosis not present

## 2024-08-21 DIAGNOSIS — F0390 Unspecified dementia without behavioral disturbance: Secondary | ICD-10-CM | POA: Diagnosis not present

## 2024-08-21 DIAGNOSIS — R54 Age-related physical debility: Secondary | ICD-10-CM | POA: Diagnosis not present

## 2024-08-22 ENCOUNTER — Emergency Department
Admission: EM | Admit: 2024-08-22 | Discharge: 2024-08-22 | Disposition: A | Discharge status: Skilled Nursing Facility | Attending: Emergency Medicine | Admitting: Emergency Medicine

## 2024-08-22 ENCOUNTER — Emergency Department

## 2024-08-22 ENCOUNTER — Other Ambulatory Visit: Payer: Self-pay

## 2024-08-22 DIAGNOSIS — S82091A Other fracture of right patella, initial encounter for closed fracture: Secondary | ICD-10-CM | POA: Diagnosis not present

## 2024-08-22 DIAGNOSIS — Z7401 Bed confinement status: Secondary | ICD-10-CM | POA: Diagnosis not present

## 2024-08-22 DIAGNOSIS — R609 Edema, unspecified: Secondary | ICD-10-CM | POA: Diagnosis not present

## 2024-08-22 DIAGNOSIS — S0990XA Unspecified injury of head, initial encounter: Secondary | ICD-10-CM | POA: Diagnosis present

## 2024-08-22 DIAGNOSIS — S0101XA Laceration without foreign body of scalp, initial encounter: Secondary | ICD-10-CM | POA: Diagnosis not present

## 2024-08-22 DIAGNOSIS — I4891 Unspecified atrial fibrillation: Secondary | ICD-10-CM | POA: Diagnosis not present

## 2024-08-22 DIAGNOSIS — E119 Type 2 diabetes mellitus without complications: Secondary | ICD-10-CM | POA: Insufficient documentation

## 2024-08-22 DIAGNOSIS — M19011 Primary osteoarthritis, right shoulder: Secondary | ICD-10-CM | POA: Diagnosis not present

## 2024-08-22 DIAGNOSIS — W19XXXA Unspecified fall, initial encounter: Secondary | ICD-10-CM

## 2024-08-22 DIAGNOSIS — W1809XA Striking against other object with subsequent fall, initial encounter: Secondary | ICD-10-CM | POA: Diagnosis not present

## 2024-08-22 DIAGNOSIS — S40011A Contusion of right shoulder, initial encounter: Secondary | ICD-10-CM | POA: Insufficient documentation

## 2024-08-22 DIAGNOSIS — R000 Tachycardia, unspecified: Secondary | ICD-10-CM | POA: Diagnosis not present

## 2024-08-22 DIAGNOSIS — Z7901 Long term (current) use of anticoagulants: Secondary | ICD-10-CM | POA: Diagnosis not present

## 2024-08-22 DIAGNOSIS — R29898 Other symptoms and signs involving the musculoskeletal system: Secondary | ICD-10-CM | POA: Diagnosis not present

## 2024-08-22 DIAGNOSIS — R58 Hemorrhage, not elsewhere classified: Secondary | ICD-10-CM | POA: Diagnosis not present

## 2024-08-22 DIAGNOSIS — I10 Essential (primary) hypertension: Secondary | ICD-10-CM | POA: Diagnosis not present

## 2024-08-22 DIAGNOSIS — I509 Heart failure, unspecified: Secondary | ICD-10-CM | POA: Diagnosis not present

## 2024-08-22 DIAGNOSIS — S0181XA Laceration without foreign body of other part of head, initial encounter: Secondary | ICD-10-CM | POA: Diagnosis not present

## 2024-08-22 DIAGNOSIS — S82001A Unspecified fracture of right patella, initial encounter for closed fracture: Secondary | ICD-10-CM | POA: Diagnosis not present

## 2024-08-22 DIAGNOSIS — D320 Benign neoplasm of cerebral meninges: Secondary | ICD-10-CM | POA: Diagnosis not present

## 2024-08-22 DIAGNOSIS — S8001XA Contusion of right knee, initial encounter: Secondary | ICD-10-CM | POA: Diagnosis not present

## 2024-08-22 DIAGNOSIS — R930 Abnormal findings on diagnostic imaging of skull and head, not elsewhere classified: Secondary | ICD-10-CM | POA: Diagnosis not present

## 2024-08-22 DIAGNOSIS — S199XXA Unspecified injury of neck, initial encounter: Secondary | ICD-10-CM | POA: Diagnosis not present

## 2024-08-22 DIAGNOSIS — M25511 Pain in right shoulder: Secondary | ICD-10-CM | POA: Diagnosis not present

## 2024-08-22 MED ORDER — OXYCODONE-ACETAMINOPHEN 5-325 MG PO TABS
1.0000 | ORAL_TABLET | Freq: Once | ORAL | Status: AC
  Administered 2024-08-22: 1 via ORAL
  Filled 2024-08-22: qty 1

## 2024-08-22 MED ORDER — LIDOCAINE-EPINEPHRINE 2 %-1:100000 IJ SOLN
20.0000 mL | Freq: Once | INTRAMUSCULAR | Status: AC
  Administered 2024-08-22: 20 mL
  Filled 2024-08-22: qty 1

## 2024-08-22 NOTE — ED Notes (Signed)
 Pt had one formed BM. Pericare provided and new brief applied. Gauze applied to forehead Lac with gauze wrapping to secure it in place. Pt tolerated well. NAD, CCB within reach.

## 2024-08-22 NOTE — ED Provider Notes (Signed)
 [facility] Provider Note    Event Date/Time   First MD Initiated Contact with Patient 08/22/24 1507     (approximate)   History   Fall   HPI  Alison Richardson is a 89 y.o. female with a history of CHF, diabetes, paroxysmal atrial fibrillation on Eliquis , and GERD who presents with right knee pain and a head injury after a fall.  The patient states that she had just used the bathroom and was trying to clean up something on the floor when she leaned forward, lost her balance, and fell, hitting her forehead and her right knee.  She did not lose consciousness.  She denies feeling dizzy or lightheaded at any time during this.  She states it was a mechanical fall.  She reports some pain in the back of her neck.  She also reports right knee pain and swelling.  She has no mid or lower back pain.  She denies any hip pain.  Reviewed the past medical records.  The patient was seen by Dr. Lenon from internal medicine on 8/21 for follow-up of her chronic medical conditions.  She was admitted to the hospitalist service last month with a left hip fracture.   Physical Exam   Triage Vital Signs: ED Triage Vitals  Encounter Vitals Group     BP 08/22/24 1506 (!) 147/76     Girls Systolic BP Percentile --      Girls Diastolic BP Percentile --      Boys Systolic BP Percentile --      Boys Diastolic BP Percentile --      Pulse Rate 08/22/24 1506 98     Resp 08/22/24 1506 17     Temp --      Temp src --      SpO2 08/22/24 1506 100 %     Weight 08/22/24 1507 158 lb (71.7 kg)     Height 08/22/24 1507 5' 6 (1.676 m)     Head Circumference --      Peak Flow --      Pain Score 08/22/24 1507 5     Pain Loc --      Pain Education --      Exclude from Growth Chart --     Most recent vital signs: Vitals:   08/22/24 1800 08/22/24 2204  BP: (!) 134/100 122/78  Pulse: (!) 104 (!) 122  Resp: 19 20  Temp:  98 F (36.7 C)  SpO2: 100% 100%     General: Alert,  comfortable appearing, no distress.  CV:  Good peripheral perfusion.  Resp:  Normal effort.  Abd:  No distention.  Other:  Right knee swelling and effusion.  Patellar tenderness.  Pain on range of motion.  Extensor mechanism intact.  Mild midline cervical spinal tenderness with no step-off or crepitus.  No thoracic or lumbar midline spinal tenderness.  EOMI.  PERRLA.  Normal speech.  No facial droop.  Motor intact in all extremities.  Approximately 3 cm forehead skin contusion/laceration.   ED Results / Procedures / Treatments   Labs (all labs ordered are listed, but only abnormal results are displayed) Labs Reviewed - No data to display   EKG  ED ECG REPORT I, Waylon Cassis, the attending physician, personally viewed and interpreted this ECG.  Date: 08/22/2024 EKG Time: 1459 Rate: 86 Rhythm: Atrial fibrillation/flutter QRS Axis: normal Intervals: normal ST/T Wave abnormalities: normal Narrative Interpretation: no evidence of acute ischemia    RADIOLOGY  CT  head: I independently viewed and interpreted images; there is no ICH.  Radiology report indicates no acute traumatic findings.  CT cervical spine: No acute fracture  XR R knee:  IMPRESSION:  1. Acute mildly displaced fracture involving the mid patella.  2. Large joint effusion with large fat fluid level.  3. Prepatellar soft tissue swelling.   XR R shoulder: No acute fracture   PROCEDURES:  Critical Care performed: No  .Laceration Repair  Date/Time: 08/22/2024 5:56 PM  Performed by: Jacolyn Pae, MD Authorized by: Jacolyn Pae, MD   Consent:    Consent obtained:  Verbal   Consent given by:  Patient Universal protocol:    Patient identity confirmed:  Verbally with patient Laceration details:    Location:  Scalp   Scalp location:  Frontal   Length (cm):  3 Exploration:    Contaminated: no   Treatment:    Amount of cleaning:  Standard Skin repair:    Repair method:  Sutures    Suture size:  4-0   Suture material:  Prolene   Suture technique:  Simple interrupted   Number of sutures:  4 Approximation:    Approximation:  Close Repair type:    Repair type:  Simple Post-procedure details:    Dressing:  Sterile dressing   Procedure completion:  Tolerated well, no immediate complications    MEDICATIONS ORDERED IN ED: Medications  lidocaine -EPINEPHrine  (XYLOCAINE  W/EPI) 2 %-1:100000 (with pres) injection 20 mL (20 mLs Other Given 08/22/24 1723)  oxyCODONE -acetaminophen  (PERCOCET/ROXICET) 5-325 MG per tablet 1 tablet (1 tablet Oral Given 08/22/24 1857)     IMPRESSION / MDM / ASSESSMENT AND PLAN / ED COURSE  I reviewed the triage vital signs and the nursing notes.   Differential diagnosis includes, but is not limited to, minor head injury, contusion, TBI, cervical spine fracture, knee contusion versus fracture.  Will obtain CT head, cervical spine, and x-rays of the right knee.  There is no indication for lab workup.  Patient's presentation is most consistent with acute complicated illness / injury requiring diagnostic workup.  The patient is on the cardiac monitor to evaluate for evidence of arrhythmia and/or significant heart rate changes.  ----------------------------------------- 5:57 PM on 08/22/2024 -----------------------------------------  CT head and cervical spine are negative.  X-ray of the right knee shows a patellar fracture.  I consulted Dr. Tobie from  orthopedics who recommended knee immobilizer and outpatient follow-up.  I anesthetized the area of possible laceration on the patient's forehead and cleaned it thoroughly with peroxide to clean off the dried blood.  The superficial skin itself just has an abrasion although I can feel a palpable linear defect in the deeper skin layer.  This was sutured with Prolene with good approximation.  The patient now reports pain to her right shoulder and does have some tenderness posteriorly although no deformity.   I have added on an x-ray of the shoulder.  ----------------------------------------- 7:17 PM on 08/22/2024 -----------------------------------------  Shoulder x-ray is negative.  The patient is stable for discharge home.  I counseled her on the results of the workup and plan of care including orthopedic follow-up for the patellar fracture and return for suture removal.  I gave strict return precautions and she expressed understanding.   FINAL CLINICAL IMPRESSION(S) / ED DIAGNOSES   Final diagnoses:  Fall, initial encounter  Laceration of scalp, initial encounter  Minor head injury, initial encounter  Contusion of right shoulder, initial encounter  Closed nondisplaced fracture of right patella, unspecified fracture morphology, initial encounter  Rx / DC Orders   ED Discharge Orders     None        Note:  This document was prepared using Dragon voice recognition software and may include unintentional dictation errors.    Jacolyn Pae, MD 08/22/24 2224

## 2024-08-22 NOTE — ED Triage Notes (Signed)
 Pt BIB [facility] from [facility] due to a mechanical fall. Pt was using the bathroom when she leaned over and hit her forehead. NO LOC. Is on Eliquis . Pt endorses pain in R knee and has a 1-2 inch lac on forehead. Bleeding is controlled. HX L hip surgery. AxO.  146/90 100 RA AFIB 90-130

## 2024-08-22 NOTE — ED Notes (Signed)
 Pt placed on to and off of bedpan. Pt states no other needs at this time.

## 2024-08-22 NOTE — Discharge Instructions (Addendum)
 Keep the knee immobilizer on until you follow-up with orthopedics.  Make an appointment to follow-up with orthopedics at [facility] next week.  The sutures should be removed in 5 to 7 days, either by the provider at your facility or in the ER here.  Use the oxycodone  you are already prescribed for pain.

## 2024-08-22 NOTE — ED Notes (Signed)
Fall bundle implemented.

## 2024-08-22 NOTE — ED Provider Notes (Signed)
 Patient alert, does not appear in acute distress.  Fall with laceration with bleeding controlled as managed by EMS.  CT head and cervical spine ordered.  Anticipate further care and treatment to be provided by oncoming provider, at this point I have however ordered CT head and cervical spine.   Dicky Anes, MD 08/22/24 [phone]

## 2024-08-22 NOTE — ED Notes (Signed)
 Pt back from CT

## 2024-08-25 DIAGNOSIS — S82034A Nondisplaced transverse fracture of right patella, initial encounter for closed fracture: Secondary | ICD-10-CM | POA: Diagnosis not present

## 2024-08-26 ENCOUNTER — Encounter: Admitting: Family

## 2024-09-01 DIAGNOSIS — G8929 Other chronic pain: Secondary | ICD-10-CM | POA: Diagnosis not present

## 2024-09-01 DIAGNOSIS — M25561 Pain in right knee: Secondary | ICD-10-CM | POA: Diagnosis not present

## 2024-09-03 DIAGNOSIS — Z8781 Personal history of (healed) traumatic fracture: Secondary | ICD-10-CM | POA: Diagnosis not present

## 2024-09-03 DIAGNOSIS — Z9889 Other specified postprocedural states: Secondary | ICD-10-CM | POA: Diagnosis not present

## 2024-09-07 DIAGNOSIS — J4540 Moderate persistent asthma, uncomplicated: Secondary | ICD-10-CM | POA: Diagnosis not present

## 2024-09-11 ENCOUNTER — Telehealth: Payer: Self-pay | Admitting: Family

## 2024-09-11 NOTE — Telephone Encounter (Signed)
 Called to confirm/remind patient of their appointment [facility] on 09/11/24.   Appointment:   [] Confirmed  [] Left mess   [] No answer/No voice mail  [x] VM Full/unable to leave message  [] Phone not in service  Patient reminded to bring all medications and/or complete list.  Confirmed patient has transportation. Gave directions, instructed to utilize valet parking.

## 2024-09-13 NOTE — Progress Notes (Deleted)
 Advanced Heart Failure Clinic Note   Referring Physician: 05/25 admission PCP: Herold Hadassah SQUIBB, MD  Cardiologist: Evalene Lunger, MD   Chief Complaint:   HPI:   Alison Richardson is a 89 y/o female with a history of skin cancer, DM, hyperlipidemia, NSTEMI, HTN, thyroid  disease, GERD, cerebral aneurysm, PAF/ atrial flutter, mild pulmonary HTN, PVD, sleep apnea, late onset alzheimer's disease, depression, anxiety and chronic heart failure. Atrial fibrillation first noted 02/23.  Echo 10/07/20: EF of 60-65% along with mild MR but without regional wall motion abnormalities.  Echo 10/31/21: EF of 45-50% along with mild MR  LHC done 10/12/21:   Ost RCA to Prox RCA lesion is 99% stenosed.   Dist RCA lesion is 65% stenosed.   RPDA lesion is 60% stenosed.   1st Diag lesion is 99% stenosed.   Ost Cx to Prox Cx lesion is 25% stenosed.   Ost LAD to Prox LAD lesion is 25% stenosed.   There is mild left ventricular systolic dysfunction.   LV end diastolic pressure is mildly elevated.   The left ventricular ejection fraction is 35-45% by visual estimate.  Admitted 12/09/22 due to pneumonia. Echo 12/11/22: EF of 50-55% along with mild MR.   Admitted 03/26/24 with worsening SOB with dry cough and wheezing. Placed on oxygen up to 10L. CXR showed pneumonia. Antibiotics provided.   Was in the ED 05/08/24 with c/o hypotension with dizziness and feeling like she was going to pass out. EKG showed atrial fibrillation but rate controlled. CXR negative. Troponin within normal limits. IVF given.   Admitted 05/14/24 with tachypnea and hypoxia of 87% on 4L. Placed on bipap. Troponin rose from 14 -> 108 --> 282 with a BNP of 290. Echo 05/14/24: EF 40-45% with LV global hypokinesis, normal RV, moderately elevated PA pressure, mild MR, mild/ moderate TR. Cardiology was consulted. Patient was started on heparin  drip. Was treated with steroids, antibiotics, and breathing treatments, gradually her respiratory status improved.   Right and left heart catheterization revealed normal left and right heart filling pressures. Pulmonology suspects hypoxic respiratory failure largely cardiac in origin. PCI on 5/27 and 2 DES were placed to the right coronary artery. CT with small pleural effusions and septal thickening with groundglass opacities. No PE on CT. Amiodarone  stopped due to concern of amiodarone  toxicity.   Echo 06/18/24: EF 60-65%, normal RV, mildly elevated PA pressure of 43.2 mmHg, mild/ moderate MR, moderate TR  Admitted 07/04/24 with fall and left hip pain. In the ED, patient was noted to have left hip fracture and underwent ORIF 07/06/24. Hypoxemia developed post-op but resolved where oxygen weaned to room air. Cardiology consulted. Amiodarone  was discontinued on 7/17 due to prolonged QTc. Midodrine  was initiated 07/08/24. 1 unit PRBC's given for anemia. 2 doses of IV Venofer  given. 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   Was in the ED 08/22/24 with right knee pain and a head injury after a fall. CT head and cervical spine and shoulder are negative. X-ray of the right knee shows a patellar fracture.   She presents today for a HF follow-up visit with a chief complaint of   ROS: All systems negative except what is listed in HPI, PMH and Problem List   Past Medical History:  Diagnosis Date   Acute on chronic congestive heart failure (HCC) 10/07/2020   Arrhythmia  Atrial fibrillation and flutter (HCC) 01/06/2019   Plavix  added to eliquis  10-22 after nstemi       Last Assessment & Plan:    Marked facial hematoma post fall but ct of neck and head reportedly normal. No ha or ataxia now but is using a walker. On eliquis  and plavix      Atrial flutter (HCC) 01/2018   new onset    Basal cell carcinoma of back    Basal cell carcinoma of lip    Cerebral aneurysm     followed by [facility]   CHF (congestive heart failure) (HCC)    DDD (degenerative disc disease), lumbar    superior plate depression, L3 08/18/2014   Diabetes mellitus type II, controlled (HCC)    Diverticulosis    Dysrhythmia    Paroxysmal Supraventricular Tachycardia   Dysthymia    depression   GERD (gastroesophageal reflux disease)    History of falling 04/03/2022   History of meniscal tear    Humeral surgical neck fracture 05/16/2022   Hyperlipidemia    Hypertension    Hypokalemia 12/12/2022   Hypothyroidism    Late onset Alzheimer's disease with behavioral disturbance (HCC)    Leukocytosis 10/07/2020   Mild pulmonary hypertension (HCC)    Moderate asthma without complication 12/12/2022   Multifocal pneumonia 12/09/2022   Overactive bladder    Peripheral vascular disease (HCC)    Puncture wound of forehead 06/11/2023   Seasonal allergic rhinitis    Severe sepsis (HCC) 02/07/2022   Sleep apnea     Current Outpatient Medications  Medication Sig Dispense Refill   albuterol  (PROVENTIL ) (2.5 MG/3ML) 0.083% nebulizer solution Take 3 mLs (2.5 mg total) by nebulization every 6 (six) hours as needed for wheezing or shortness of breath. (Patient not taking: Reported on 07/04/2024) 75 mL 3   apixaban  (ELIQUIS ) 5 MG TABS tablet Take 1 tablet (5 mg total) by mouth 2 (two) times daily. 180 tablet 3   bisacodyl  (DULCOLAX) 10 MG suppository Place 1 suppository (10 mg total) rectally daily as needed for severe constipation.     clopidogrel  (PLAVIX ) 75 MG tablet Take 1 tablet (75 mg total) by mouth daily with breakfast. (Patient not taking: Reported on 07/04/2024) 90 tablet 0   cyanocobalamin  1000 MCG tablet Take 1 tablet (1,000 mcg total) by mouth daily.     donepezil  (ARICEPT ) 10 MG tablet TAKE ONE TABLET (10 MG) BY MOUTH AT BEDTIME 90 tablet 0   famotidine  (PEPCID ) 20 MG tablet Take 1 tablet (20 mg total) by mouth 2 (two) times daily before a meal. 60 tablet 0   feeding supplement, GLUCERNA  SHAKE, (GLUCERNA SHAKE) LIQD Take 237 mLs by mouth 3 (three) times daily between meals.     gabapentin  (NEURONTIN ) 100 MG capsule Take 1 capsule (100 mg total) by mouth at bedtime.     hydrOXYzine  (ATARAX ) 25 MG tablet Take 1 tablet (25 mg total) by mouth 3 (three) times daily as needed for itching.     insulin  glargine-yfgn (SEMGLEE ) 100 UNIT/ML injection Inject 0.1 mLs (10 Units total) into the skin daily.     iron  polysaccharides (NIFEREX) 150 MG capsule Take 1 capsule (150 mg total) by mouth daily.     levothyroxine  (SYNTHROID ) 88 MCG tablet TAKE 1 TABLET EVERY DAY ON EMPTY STOMACHWITH A GLASS OF WATER AT LEAST 30-60 MINBEFORE BREAKFAST 90 tablet 3   lidocaine  (LIDODERM ) 5 % Place 1 patch onto the skin daily. Remove & Discard patch within 12 hours or as directed by MDApply  to left hip Apply to intact skin to cover the most painful area. Apply the prescribed number of patches (maximum of 3), for up to 12 hours within a 24 hour period.     metoprolol  tartrate (LOPRESSOR ) 50 MG tablet Take 1 tablet (50 mg total) by mouth 2 (two) times daily.     midodrine  (PROAMATINE ) 5 MG tablet Take 1 tablet (5 mg total) by mouth 3 (three) times daily with meals. Do NOT hold for dialysis. GIVE DOSE DURING DAYLIGHT HOURS ONLY     nitroGLYCERIN  (NITROSTAT ) 0.4 MG SL tablet Place 1 tablet (0.4 mg total) under the tongue every 5 (five) minutes as needed for chest pain. 25 tablet 3   oxyCODONE -acetaminophen  (PERCOCET/ROXICET) 5-325 MG tablet Take 0.5-1 tablets by mouth every 4 (four) hours as needed for moderate pain (pain score 4-6). 30 tablet 0   OZEMPIC , 1 MG/DOSE, 4 MG/3ML SOPN Inject 1 mg into the skin once a week.     polyethylene glycol (MIRALAX  / GLYCOLAX ) 17 g packet Take 17 g by mouth daily. Skip the dose if no constipation. Dissolve 17 grams in 4 ounces of water.     pravastatin  (PRAVACHOL ) 20 MG tablet Take 1 tablet (20 mg total) by mouth at bedtime. 90 tablet 3   Semaglutide , 1 MG/DOSE, (OZEMPIC , 1  MG/DOSE,) 2 MG/1.5ML SOPN Inject 1 mg into the skin once a week. 3 mL 0   spironolactone  (ALDACTONE ) 25 MG tablet Take 0.5 tablets (12.5 mg total) by mouth daily. 45 tablet 3   torsemide  (DEMADEX ) 20 MG tablet Take 1 tablet (20 mg total) by mouth daily as needed. For leg edema or sob 30 tablet 0   venlafaxine  XR (EFFEXOR -XR) 75 MG 24 hr capsule TAKE 1 CAPSULE BY MOUTH ONCE DAILY. 90 capsule 0   No current facility-administered medications for this visit.    Allergies  Allergen Reactions   Pantoprazole  Nausea And Vomiting   Metformin And Related Diarrhea      Social History   Socioeconomic History   Marital status: Widowed    Spouse name: Not on file   Number of children: 5   Years of education: Not on file   Highest education level: Not on file  Occupational History   Not on file  Tobacco Use   Smoking status: Never   Smokeless tobacco: Never  Vaping Use   Vaping status: Never Used  Substance and Sexual Activity   Alcohol use: Not Currently   Drug use: Never   Sexual activity: Not Currently  Other Topics Concern   Not on file  Social History Narrative   Not on file   Social Drivers of Health   Financial Resource Strain: Low Risk  (07/29/2024)   Received from [facility]   Overall Financial Resource Strain (CARDIA)    Difficulty of Paying Living Expenses: Not hard at all  Food Insecurity: No Food Insecurity (07/29/2024)   Received from [facility]   Hunger Vital Sign    Within the past 12 months, you worried that your food would run out before you got the money to buy more.: Never true    Within the past 12 months, the food you bought just didn't last and you didn't have money to get more.: Never true  Transportation Needs: No Transportation Needs (07/29/2024)   Received from [facility] - Transportation    In the past 12 months, has lack of transportation kept you from medical appointments  or from getting  medications?: No    Lack of Transportation (Non-Medical): No  Physical Activity: Insufficiently Active (04/09/2023)   Exercise Vital Sign    Days of Exercise per Week: 4 days    Minutes of Exercise per Session: 20 min  Stress: No Stress Concern Present (04/09/2023)   [employer] of Occupational Health - Occupational Stress Questionnaire    Feeling of Stress : Not at all  Social Connections: Socially Isolated (07/05/2024)   Social Connection and Isolation Panel    Frequency of Communication with Friends and Family: More than three times a week    Frequency of Social Gatherings with Friends and Family: More than three times a week    Attends Religious Services: Never    Database administrator or Organizations: No    Attends Banker Meetings: Never    Marital Status: Widowed  Intimate Partner Violence: Not At Risk (07/05/2024)   Humiliation, Afraid, Rape, and Kick questionnaire    Fear of Current or Ex-Partner: No    Emotionally Abused: No    Physically Abused: No    Sexually Abused: No      Family History  Problem Relation Age of Onset   Hypertension Mother    Heart disease Father    CAD Father    Heart disease Sister    Diabetes Sister    Diabetes Paternal Uncle    Sleep apnea Son       PHYSICAL EXAM:  General: Well appearing. No resp difficulty HEENT: normal Neck: supple, no JVD Cor: Regular rhythm, rate. No rubs, gallops or murmurs Lungs: clear Abdomen: soft, nontender, nondistended. Extremities: no cyanosis, clubbing, rash, edema Neuro: alert & oriented X 3. Moves all 4 extremities w/o difficulty. Affect pleasant   ECG: not done   ASSESSMENT & PLAN:  1: NICM with reduced ejection fraction - - suspect due to atrial fibrillation - NYHA class III - euvolemic today - weight 164.6 from last visit here 3 months ago - echo 12/11/22: EF of 50-55% along with mild MR.  - Echo 05/14/24: EF 40-45% with LV global hypokinesis, normal RV, moderately  elevated PA pressure, mild MR, mild/ moderate TR.  - Echo 06/18/24: EF 60-65%, normal RV, mildly elevated PA pressure of 43.2 mmHg, mild/ moderate MR, moderate TR - continue losartan  25mg  daily - continue torsemide  20mg  daily PRN; reviewed parameters of when to take it - had a side effect w/ SGLT2 although can't remember what it was  - has had low BP in the past so may not be able to tolerate MRA/ beta blocker - BNP 05/14/24 was 290.1  2: HTN- - BP  - saw PCP [place]) 05/25 - BMP 08/05/24 reviewed: sodium 139, potassium 4.8, creatinine 0.77 and GFR 74  3: DM- - A1c 04/09/24 was 7.8% - glipizide  5mg  daily  4: Atrial fibrillation- - saw cardiology Reggy) 08/25 - continue apixaban  5mg  BID - continue clopidogrel  75mg  daily  5: CAD- - LHC done 10/12/21:   Ost RCA to Prox RCA lesion is 99% stenosed.   Dist RCA lesion is 65% stenosed.   RPDA lesion is 60% stenosed.   1st Diag lesion is 99% stenosed.   Ost Cx to Prox Cx lesion is 25% stenosed.   Ost LAD to Prox LAD lesion is 25% stenosed.   There is mild left ventricular systolic dysfunction.   LV end diastolic pressure is mildly elevated.   The left ventricular ejection fraction is 35-45% by visual estimate.  Ellouise DELENA Class, FNP 09/13/24

## 2024-09-14 ENCOUNTER — Encounter: Admitting: Family

## 2024-09-29 DIAGNOSIS — S82031D Displaced transverse fracture of right patella, subsequent encounter for closed fracture with routine healing: Secondary | ICD-10-CM | POA: Diagnosis not present

## 2024-10-02 ENCOUNTER — Other Ambulatory Visit: Payer: Self-pay | Admitting: Pediatrics

## 2024-10-02 DIAGNOSIS — Z79899 Other long term (current) drug therapy: Secondary | ICD-10-CM

## 2024-10-02 DIAGNOSIS — G629 Polyneuropathy, unspecified: Secondary | ICD-10-CM

## 2024-10-02 DIAGNOSIS — R42 Dizziness and giddiness: Secondary | ICD-10-CM

## 2024-10-02 NOTE — Telephone Encounter (Unsigned)
 Copied from CRM [id number]. Topic: Clinical - Medication Refill >> Oct 02, 2024  9:24 AM Rosaria E wrote: Medication:  famotidine  (PEPCID ) 20 MG tablet (Expired) Ferrous sulfate 325 MG once daily  gabapentin  (NEURONTIN ) 100 MG capsule midodrine  (PROAMATINE ) 5 MG tablet metoprolol  tartrate (LOPRESSOR ) 50 MG tablet spironolactone  (ALDACTONE ) 12.5 MG tablet venlafaxine  XR (EFFEXOR -XR) 75 MG 24 hr capsule levothyroxine  (SYNTHROID ) 88 MCG tablet  Has the patient contacted their pharmacy? Yes (Agent: If no, request that the patient contact the pharmacy for the refill. If patient does not wish to contact the pharmacy document the reason why and proceed with request.) (Agent: If yes, when and what did the pharmacy advise?)  This is the patient's preferred pharmacy:  [facility] - [place], [place] - [address] [place] [address] Phone: [phone] Fax: [phone]  Is this the correct pharmacy for this prescription? Yes If no, delete pharmacy and type the correct one.   Has the prescription been filled recently? Yes  Is the patient out of the medication? Almost out.   Has the patient been seen for an appointment in the last year OR does the patient have an upcoming appointment? Yes  Can we respond through MyChart? Yes  Agent: Please be advised that Rx refills may take up to 3 business days. We ask that you follow-up with your pharmacy.

## 2024-10-05 NOTE — Telephone Encounter (Signed)
 Requested medications are due for refill today.  unsure  Requested medications are on the active medications list.  yes  Last refill. 09/10/2023  Future visit scheduled.   no  Notes to clinic.  Medication is historical.   Requested Prescriptions  Pending Prescriptions Disp Refills   gabapentin  (NEURONTIN ) 100 MG capsule [Pharmacy Med Name: GABAPENTIN  100 MG CAP] 180 capsule     Sig: TAKE 2 CAPSULE BY MOUTH TWICE DAILY     Neurology: Anticonvulsants - gabapentin  Passed - 10/05/2024  3:07 PM      Passed - Cr in normal range and within 360 days    Creatinine, Ser  Date Value Ref Range Status  08/05/2024 0.77 0.57 - 1.00 mg/dL Final         Passed - Completed PHQ-2 or PHQ-9 in the last 360 days      Passed - Valid encounter within last 12 months    Recent Outpatient Visits           4 months ago Chronic diastolic CHF (congestive heart failure) (HCC)   [place] [facility] Herold Hadassah SQUIBB, MD   4 months ago Chest discomfort   [place] [facility] Herold Hadassah SQUIBB, MD   5 months ago Type 2 diabetes mellitus with peripheral neuropathy [facility])   [place] [facility] Melvin Pao, NP   5 months ago Leukocytosis, unspecified type   [place] [facility] Melvin Pao, NP   5 months ago Other fatigue   [place] [facility] [place], Hadassah SQUIBB, MD       Future Appointments             In 1 month Hammock, Tylene, NP [place] [facility]            Signed Prescriptions Disp Refills   donepezil  (ARICEPT ) 10 MG tablet 90 tablet 0    Sig: TAKE ONE TABLET (10 MG) BY MOUTH AT BEDTIME     Neurology:  Alzheimer's Agents Passed - 10/05/2024  3:07 PM      Passed - Valid encounter within last 6 months    Recent Outpatient Visits           4 months ago Chronic diastolic CHF (congestive heart failure) (HCC)   [place] [facility] Herold Hadassah SQUIBB, MD   4 months ago Chest  discomfort   [place] [facility] Herold Hadassah SQUIBB, MD   5 months ago Type 2 diabetes mellitus with peripheral neuropathy [facility])   [place] [facility] Melvin Pao, NP   5 months ago Leukocytosis, unspecified type   [place] [facility] Melvin Pao, NP   5 months ago Other fatigue   [place] [facility] Herold Hadassah SQUIBB, MD       Future Appointments             In 1 month Hammock, Tylene, NP [place] [facility]             venlafaxine  XR (EFFEXOR -XR) 75 MG 24 hr capsule 90 capsule 0    Sig: TAKE 1 CAPSULE BY MOUTH ONCE DAILY     Psychiatry: Antidepressants - SNRI - desvenlafaxine & venlafaxine  Failed - 10/05/2024  3:07 PM      Failed - Lipid Panel in normal range within the last 12 months    Cholesterol, Total  Date Value Ref Range Status  04/16/2023 211 (H) 100 - 199 mg/dL  Final   Cholesterol  Date Value Ref Range Status  05/19/2024 152 0 - 200 mg/dL Final   LDL Chol Calc (NIH)  Date Value Ref Range Status  04/16/2023 110 (H) 0 - 99 mg/dL Final   LDL Cholesterol  Date Value Ref Range Status  05/19/2024 66 0 - 99 mg/dL Final    Comment:           Total Cholesterol/HDL:CHD Risk Coronary Heart Disease Risk Table                     Men   Women  1/2 Average Risk   3.4   3.3  Average Risk       5.0   4.4  2 X Average Risk   9.6   7.1  3 X Average Risk  23.4   11.0        Use the calculated Patient Ratio above and the CHD Risk Table to determine the patient's CHD Risk.        ATP III CLASSIFICATION (LDL):  <100     mg/dL   Optimal  899-870  mg/dL   Near or Above                    Optimal  130-159  mg/dL   Borderline  839-810  mg/dL   High  >809     mg/dL   Very High Performed at [facility], [address] [place], [place] [address]    HDL  Date Value Ref Range Status  05/19/2024 68 >40 mg/dL Final  95/76/7975 76 >60 mg/dL Final   Triglycerides  Date  Value Ref Range Status  05/19/2024 92 <150 mg/dL Final         Passed - Cr in normal range and within 360 days    Creatinine, Ser  Date Value Ref Range Status  08/05/2024 0.77 0.57 - 1.00 mg/dL Final         Passed - Completed PHQ-2 or PHQ-9 in the last 360 days      Passed - Last BP in normal range    BP Readings from Last 1 Encounters:  08/22/24 122/78         Passed - Valid encounter within last 6 months    Recent Outpatient Visits           4 months ago Chronic diastolic CHF (congestive heart failure) [facility])   [place] [facility] Herold Hadassah SQUIBB, MD   4 months ago Chest discomfort   [place] [facility] Herold Hadassah SQUIBB, MD   5 months ago Type 2 diabetes mellitus with peripheral neuropathy [facility])   [place] [facility] Melvin Pao, NP   5 months ago Leukocytosis, unspecified type   [place] [facility] Melvin Pao, NP   5 months ago Other fatigue   [place] [facility] Herold Hadassah SQUIBB, MD       Future Appointments             In 1 month Hammock, Tylene, NP [place] [facility]

## 2024-10-05 NOTE — Telephone Encounter (Signed)
 Requested Prescriptions  Pending Prescriptions Disp Refills   donepezil  (ARICEPT ) 10 MG tablet [Pharmacy Med Name: DONEPEZIL  HCL 10 MG TAB] 90 tablet 0    Sig: TAKE ONE TABLET (10 MG) BY MOUTH AT BEDTIME     Neurology:  Alzheimer's Agents Passed - 10/05/2024  3:06 PM      Passed - Valid encounter within last 6 months    Recent Outpatient Visits           4 months ago Chronic diastolic CHF (congestive heart failure) (HCC)   [place] [facility] Herold Hadassah SQUIBB, MD   4 months ago Chest discomfort   [place] [facility] Herold Hadassah SQUIBB, MD   5 months ago Type 2 diabetes mellitus with peripheral neuropathy [facility])   [place] [facility] Melvin Pao, NP   5 months ago Leukocytosis, unspecified type   [place] [facility] Melvin Pao, NP   5 months ago Other fatigue   [place] [facility] Herold Hadassah SQUIBB, MD       Future Appointments             In 1 month Hammock, Tylene, NP [place] [name]             gabapentin  (NEURONTIN ) 100 MG capsule [Pharmacy Med Name: GABAPENTIN  100 MG CAP] 180 capsule     Sig: TAKE 2 CAPSULE BY MOUTH TWICE DAILY     Neurology: Anticonvulsants - gabapentin  Passed - 10/05/2024  3:06 PM      Passed - Cr in normal range and within 360 days    Creatinine, Ser  Date Value Ref Range Status  08/05/2024 0.77 0.57 - 1.00 mg/dL Final         Passed - Completed PHQ-2 or PHQ-9 in the last 360 days      Passed - Valid encounter within last 12 months    Recent Outpatient Visits           4 months ago Chronic diastolic CHF (congestive heart failure) (HCC)   [place] [facility] Herold Hadassah SQUIBB, MD   4 months ago Chest discomfort   [place] [facility] Herold Hadassah SQUIBB, MD   5 months ago Type 2 diabetes mellitus with peripheral neuropathy [facility])   [place] [facility] Melvin Pao, NP   5 months ago  Leukocytosis, unspecified type   [place] [facility] Melvin Pao, NP   5 months ago Other fatigue   [place] [facility] Herold Hadassah SQUIBB, MD       Future Appointments             In 1 month Hammock, Tylene, NP [place] [facility]             venlafaxine  XR (EFFEXOR -XR) 75 MG 24 hr capsule [Pharmacy Med Name: VENLAFAXINE  HCL ER 75 MG CAP] 90 capsule 0    Sig: TAKE 1 CAPSULE BY MOUTH ONCE DAILY     Psychiatry: Antidepressants - SNRI - desvenlafaxine & venlafaxine  Failed - 10/05/2024  3:06 PM      Failed - Lipid Panel in normal range within the last 12 months    Cholesterol, Total  Date Value Ref Range Status  04/16/2023 211 (H) 100 - 199 mg/dL Final   Cholesterol  Date Value Ref Range Status  05/19/2024 152 0 - 200 mg/dL Final   LDL Chol Calc (NIH)  Date Value Ref Range  Status  04/16/2023 110 (H) 0 - 99 mg/dL Final   LDL Cholesterol  Date Value Ref Range Status  05/19/2024 66 0 - 99 mg/dL Final    Comment:           Total Cholesterol/HDL:CHD Risk Coronary Heart Disease Risk Table                     Men   Women  1/2 Average Risk   3.4   3.3  Average Risk       5.0   4.4  2 X Average Risk   9.6   7.1  3 X Average Risk  23.4   11.0        Use the calculated Patient Ratio above and the CHD Risk Table to determine the patient's CHD Risk.        ATP III CLASSIFICATION (LDL):  <100     mg/dL   Optimal  899-870  mg/dL   Near or Above                    Optimal  130-159  mg/dL   Borderline  839-810  mg/dL   High  >809     mg/dL   Very High Performed at [facility], [address] [place], [place] [address]    HDL  Date Value Ref Range Status  05/19/2024 68 >40 mg/dL Final  95/76/7975 76 >60 mg/dL Final   Triglycerides  Date Value Ref Range Status  05/19/2024 92 <150 mg/dL Final         Passed - Cr in normal range and within 360 days    Creatinine, Ser  Date Value Ref Range Status   08/05/2024 0.77 0.57 - 1.00 mg/dL Final         Passed - Completed PHQ-2 or PHQ-9 in the last 360 days      Passed - Last BP in normal range    BP Readings from Last 1 Encounters:  08/22/24 122/78         Passed - Valid encounter within last 6 months    Recent Outpatient Visits           4 months ago Chronic diastolic CHF (congestive heart failure) [facility])   [place] [facility] Herold Hadassah SQUIBB, MD   4 months ago Chest discomfort   [place] [facility] Herold Hadassah SQUIBB, MD   5 months ago Type 2 diabetes mellitus with peripheral neuropathy [facility])   [place] [facility] Melvin Pao, NP   5 months ago Leukocytosis, unspecified type   [place] [facility] Melvin Pao, NP   5 months ago Other fatigue   [place] [facility] Herold Hadassah SQUIBB, MD       Future Appointments             In 1 month Hammock, Tylene, NP [place] [facility]

## 2024-10-06 NOTE — Telephone Encounter (Unsigned)
 Copied from CRM #[id number]. Topic: Clinical - Medication Refill >> Oct 06, 2024  3:45 PM Olam RAMAN wrote: Medication:  midodrine  (PROAMATINE ) 5 MG tablet famotidine  (PEPCID ) 20 MG tablet ferrous sulfate 325 mg metoprolol  tartrate (LOPRESSOR ) 50 MG tablet gabapentin  (NEURONTIN ) 100 MG capsule   Has the patient contacted their pharmacy? Yes (Agent: If no, request that the patient contact the pharmacy for the refill. If patient does not wish to contact the pharmacy document the reason why and proceed with request.) (Agent: If yes, when and what did the pharmacy advise?)  This is the patient's preferred pharmacy:  [facility] - [place], [place] - [address] [place] [address] Phone: [phone] Fax: [phone]  [facility] [address] [place] [address] Phone: [phone] Fax: [phone]  [facility] - [address], [place] - [address] [place] [address] Phone: [phone] Fax: [phone]  [id number] [place], [place] - [address] AT [facility] [address] [place] [place] [address] Phone: [phone] Fax: [phone]  Is this the correct pharmacy for this prescription? yes If no, delete pharmacy and type the correct one.   Has the prescription been filled recently? Yes  Is the patient out of the medication? Yes  Has the patient been seen for an appointment in the last year OR does the patient have an upcoming appointment? No  Can we respond through MyChart? No  Agent: Please be advised that Rx refills may take up to 3 business days. We ask that you follow-up with your pharmacy.

## 2024-10-07 ENCOUNTER — Ambulatory Visit: Payer: Self-pay

## 2024-10-07 DIAGNOSIS — R2689 Other abnormalities of gait and mobility: Secondary | ICD-10-CM | POA: Diagnosis not present

## 2024-10-07 DIAGNOSIS — R2681 Unsteadiness on feet: Secondary | ICD-10-CM | POA: Diagnosis not present

## 2024-10-07 DIAGNOSIS — M2681 Anterior soft tissue impingement: Secondary | ICD-10-CM | POA: Diagnosis not present

## 2024-10-07 DIAGNOSIS — R296 Repeated falls: Secondary | ICD-10-CM | POA: Diagnosis not present

## 2024-10-07 DIAGNOSIS — S72001D Fracture of unspecified part of neck of right femur, subsequent encounter for closed fracture with routine healing: Secondary | ICD-10-CM | POA: Diagnosis not present

## 2024-10-07 DIAGNOSIS — Z741 Need for assistance with personal care: Secondary | ICD-10-CM | POA: Diagnosis not present

## 2024-10-07 DIAGNOSIS — J4540 Moderate persistent asthma, uncomplicated: Secondary | ICD-10-CM | POA: Diagnosis not present

## 2024-10-07 NOTE — Telephone Encounter (Signed)
 FYI Only or Action Required?: Action required by provider: Requesting information regarding her medication.  Patient was last seen in primary care on 05/26/2024 by Herold Hadassah SQUIBB, MD.  Called Nurse Triage reporting Medication Problem.   Triage Disposition: Call PCP When Office is Open  Patient/caregiver understands and will follow disposition?: Yes     Copied from CRM [id number]. Topic: Clinical - Medication Question >> Oct 07, 2024  2:31 PM Emylou G wrote: Reason for CRM: can she take the OZEMPIC , 1 MG/DOSE, 4 MG/3ML SOPN - it's been 2.5 mos was in the hosp w/broken femur and covide Reason for Disposition  [1] Caller has NON-URGENT medicine question about med that PCP prescribed AND [2] triager unable to answer question  Answer Assessment - Initial Assessment Questions 1. NAME of MEDICINE: What medicine(s) are you calling about?     OZEMPIC , 1 MG/DOSE, 4 MG/3ML 2. QUESTION: What is your question? (e.g., double dose of medicine, side effect)     Can she start back taking medication due to recent hospital admission.  3. PRESCRIBER: Who prescribed the medicine? Reason: if prescribed by specialist, call should be referred to that group.     PCP 4. SYMPTOMS: Do you have any symptoms? If Yes, ask: What symptoms are you having?  How bad are the symptoms (e.g., mild, moderate, severe)     Denies symptoms    Patient concerned about her ozempic  medication. She states she was receiving shots in the hospital. Patient currently taking her blood sugar regularly and denies any abnormal readings.  Patient requesting a call back regarding the request.  Protocols used: Medication Question Call-A-AH

## 2024-10-09 DIAGNOSIS — S72001D Fracture of unspecified part of neck of right femur, subsequent encounter for closed fracture with routine healing: Secondary | ICD-10-CM | POA: Diagnosis not present

## 2024-10-09 DIAGNOSIS — Z741 Need for assistance with personal care: Secondary | ICD-10-CM | POA: Diagnosis not present

## 2024-10-09 DIAGNOSIS — R2689 Other abnormalities of gait and mobility: Secondary | ICD-10-CM | POA: Diagnosis not present

## 2024-10-09 DIAGNOSIS — M2681 Anterior soft tissue impingement: Secondary | ICD-10-CM | POA: Diagnosis not present

## 2024-10-09 DIAGNOSIS — R296 Repeated falls: Secondary | ICD-10-CM | POA: Diagnosis not present

## 2024-10-11 NOTE — Patient Instructions (Incomplete)

## 2024-10-12 DIAGNOSIS — R2681 Unsteadiness on feet: Secondary | ICD-10-CM | POA: Diagnosis not present

## 2024-10-12 DIAGNOSIS — R2689 Other abnormalities of gait and mobility: Secondary | ICD-10-CM | POA: Diagnosis not present

## 2024-10-12 DIAGNOSIS — R296 Repeated falls: Secondary | ICD-10-CM | POA: Diagnosis not present

## 2024-10-12 DIAGNOSIS — S72001D Fracture of unspecified part of neck of right femur, subsequent encounter for closed fracture with routine healing: Secondary | ICD-10-CM | POA: Diagnosis not present

## 2024-10-13 ENCOUNTER — Ambulatory Visit: Payer: Self-pay | Admitting: Nurse Practitioner

## 2024-10-13 ENCOUNTER — Encounter: Payer: Self-pay | Admitting: Nurse Practitioner

## 2024-10-13 ENCOUNTER — Ambulatory Visit: Admitting: Nurse Practitioner

## 2024-10-13 VITALS — BP 121/84 | HR 85 | Temp 98.5°F | Resp 14

## 2024-10-13 DIAGNOSIS — I4811 Longstanding persistent atrial fibrillation: Secondary | ICD-10-CM | POA: Diagnosis not present

## 2024-10-13 DIAGNOSIS — I2510 Atherosclerotic heart disease of native coronary artery without angina pectoris: Secondary | ICD-10-CM

## 2024-10-13 DIAGNOSIS — I152 Hypertension secondary to endocrine disorders: Secondary | ICD-10-CM

## 2024-10-13 DIAGNOSIS — S91309A Unspecified open wound, unspecified foot, initial encounter: Secondary | ICD-10-CM | POA: Insufficient documentation

## 2024-10-13 DIAGNOSIS — G4733 Obstructive sleep apnea (adult) (pediatric): Secondary | ICD-10-CM

## 2024-10-13 DIAGNOSIS — G301 Alzheimer's disease with late onset: Secondary | ICD-10-CM

## 2024-10-13 DIAGNOSIS — E1159 Type 2 diabetes mellitus with other circulatory complications: Secondary | ICD-10-CM | POA: Diagnosis not present

## 2024-10-13 DIAGNOSIS — Z9181 History of falling: Secondary | ICD-10-CM

## 2024-10-13 DIAGNOSIS — E785 Hyperlipidemia, unspecified: Secondary | ICD-10-CM | POA: Diagnosis not present

## 2024-10-13 DIAGNOSIS — I5020 Unspecified systolic (congestive) heart failure: Secondary | ICD-10-CM

## 2024-10-13 DIAGNOSIS — E1142 Type 2 diabetes mellitus with diabetic polyneuropathy: Secondary | ICD-10-CM | POA: Diagnosis not present

## 2024-10-13 DIAGNOSIS — F0280 Dementia in other diseases classified elsewhere without behavioral disturbance: Secondary | ICD-10-CM

## 2024-10-13 DIAGNOSIS — E1169 Type 2 diabetes mellitus with other specified complication: Secondary | ICD-10-CM | POA: Diagnosis not present

## 2024-10-13 DIAGNOSIS — Z23 Encounter for immunization: Secondary | ICD-10-CM

## 2024-10-13 LAB — BAYER DCA HB A1C WAIVED: HB A1C (BAYER DCA - WAIVED): 7.3 % — ABNORMAL HIGH (ref 4.8–5.6)

## 2024-10-13 MED ORDER — CVS MANUKA HONEY WOUND EX GEL: 1.0000 "application " | Freq: Two times a day (BID) | CUTANEOUS | 4 refills | Status: DC

## 2024-10-13 MED ORDER — ACCU-CHEK GUIDE ME W/DEVICE KIT: 1.0000 | PACK | Freq: Every day | 1 refills | Status: AC

## 2024-10-13 MED ORDER — ACCU-CHEK GUIDE TEST VI STRP
ORAL_STRIP | 12 refills | Status: AC
Start: 2024-10-13 — End: ?

## 2024-10-13 MED ORDER — ACCU-CHEK GUIDE TEST VI STRP: ORAL_STRIP | 12 refills | Status: AC

## 2024-10-13 NOTE — Progress Notes (Signed)
 Contacted via MyChart  Good evening Alison Richardson, your A1c is stable at 7.3%. I would recommend restarting the Ozempic  and reducing insulin  dosage if still using, you could even consider stopping the daily insulin  since dose was so low. Any questions?

## 2024-10-13 NOTE — Assessment & Plan Note (Signed)
 Two falls in past months, one with hip fracture and another with patella fracture. She is on Eliquis  and Plavix , will need to monitor this closely and may benefit discontinuation of this in future if ongoing falls due to risk for bleeding. Continue PT at [facility] + using walker. Minimize medications when possible.

## 2024-10-13 NOTE — Assessment & Plan Note (Signed)
 Chronic, ongoing. April A1c 8.2%, close to goal for age. Per recommendations goal A1c for Alison Richardson is <8% due to advanced age. Will recheck today. Continue current medication regimen for now, but would benefit insulin  discontinuation or reduction if levels stable.  Then continue Ozempic . - No current ACE/ARB or Statin - Vaccines, needs flu vaccine future visit - Needs eye exam. Foot exam up to date.

## 2024-10-13 NOTE — Assessment & Plan Note (Addendum)
 Chronic, ongoing. Currently on Plavix  and Eliquis , monitor this closely with recent falls. May benefit discontinuation if ongoing falls present due to risk for bleeding. Continue to collaborate with cardiology.

## 2024-10-13 NOTE — Progress Notes (Signed)
 BP 121/84 (BP Location: Left Arm, Patient Position: Sitting, Cuff Size: Normal)   Pulse 85   Temp 98.5 F (36.9 C) (Oral)   Resp 14   SpO2 96%    Subjective:    Patient ID: Alison Richardson, female    DOB: 09-20-1933, 89 y.o.   MRN: [id number]  HPI: Alison Richardson is a 89 y.o. female  Chief Complaint  Patient presents with   Hospitalization Follow-up    Back home for 3 days now. Has round the clock assistance right now. Follows up with ortho next week to have brace of right knee.    Foot exam    Has some concerns for sores.    Right second toe and left medial foot  ER FOLLOW UP Seen in ER on 08/22/24 after having a fall which led to non displaced fracture of right patella. Prior to this she had a fall on 07/04/24 and was admitted to hospital for a closed fracture of left hip. Is following with ortho for both, last seen on 09/29/2024. Currently is using walker. Prior to falls only used rollator as needed. Currently wearing brace to knee as goal is not to have surgery.  Has PT 5 times a week.    Recent fall she was in [place], nursing side of [facility], was not supposed to walk without someone with her. Had diarrhea and got up, then fell.    She is diabetic with last A1c in April 8.2%, uses insulin  and Ozempic .  Ozempic  was on hold for a period due to injuries. Follows with cardiology and HF clinic for history of NSTEMI, A-fib, and HF. Last HF Clinic visit on 05/28/24 and cardiology on 08/05/24. Last saw neurology on 09/25/22, has late onset dementia. Would like feet checked today, concern about sores to both feet that have been there a week. Has OSA and reports it was not helping and could not keep it on all night.  Time since discharge:  Hospital/facility: [facility] Diagnosis: Patella fracture Procedures/tests: No labs recently, imaging done Consultants: Ortho New medications: as below Discharge instructions: Follow-up [facility] and PCP Status: feels she is getting a little bit better    Relevant past medical, surgical, family and social history reviewed and updated as indicated. Interim medical history since our last visit reviewed. Allergies and medications reviewed and updated.  Review of Systems  Constitutional:  Negative for activity change, appetite change, diaphoresis, fatigue and fever.  Respiratory:  Negative for cough, chest tightness, shortness of breath and wheezing.   Cardiovascular:  Negative for chest pain, palpitations and leg swelling.  Gastrointestinal: Negative.   Endocrine: Negative.   Skin:  Positive for wound.  Neurological: Negative.   Psychiatric/Behavioral: Negative.      Per HPI unless specifically indicated above     Objective:    BP 121/84 (BP Location: Left Arm, Patient Position: Sitting, Cuff Size: Normal)   Pulse 85   Temp 98.5 F (36.9 C) (Oral)   Resp 14   SpO2 96%   Wt Readings from Last 3 Encounters:  08/22/24 158 lb (71.7 kg)  08/05/24 157 lb (71.2 kg)  07/07/24 162 lb 14.7 oz (73.9 kg)    Physical Exam Vitals and nursing note reviewed.  Constitutional:      General: She is awake. She is not in acute distress.    Appearance: She is well-developed and well-groomed. She is not ill-appearing or toxic-appearing.     Comments: In Wheelchair for visit.  HENT:     Head:  Normocephalic.     Right Ear: Hearing and external ear normal.     Left Ear: Hearing and external ear normal.  Eyes:     General: Lids are normal.        Right eye: No discharge.        Left eye: No discharge.     Conjunctiva/sclera: Conjunctivae normal.     Pupils: Pupils are equal, round, and reactive to light.  Neck:     Thyroid : No thyromegaly.     Vascular: No carotid bruit.  Cardiovascular:     Rate and Rhythm: Normal rate and regular rhythm.     Pulses:          Dorsalis pedis pulses are 1+ on the right side and 1+ on the left side.       Posterior tibial pulses are 1+ on the right side and 1+ on the left side.     Heart sounds: Normal heart  sounds. No murmur heard.    No gallop.  Pulmonary:     Effort: Pulmonary effort is normal. No accessory muscle usage or respiratory distress.     Breath sounds: Normal breath sounds.  Abdominal:     General: Bowel sounds are normal. There is no distension.     Palpations: Abdomen is soft.     Tenderness: There is no abdominal tenderness.  Musculoskeletal:     Cervical back: Normal range of motion and neck supple.     Right lower leg: No edema.     Left lower leg: No edema.     Right foot: Normal range of motion.     Left foot: Normal range of motion.       Feet:  Feet:     Right foot:     Protective Sensation: 10 sites tested.  8 sites sensed.     Skin integrity: Dry skin present.     Left foot:     Protective Sensation: 10 sites tested.  7 sites sensed.     Skin integrity: Dry skin present.  Lymphadenopathy:     Cervical: No cervical adenopathy.  Skin:    General: Skin is warm and dry.  Neurological:     Mental Status: She is alert and oriented to person, place, and time.     Deep Tendon Reflexes: Reflexes are normal and symmetric.     Reflex Scores:      Brachioradialis reflexes are 2+ on the right side and 2+ on the left side.      Patellar reflexes are 2+ on the right side and 2+ on the left side. Psychiatric:        Attention and Perception: Attention normal.        Mood and Affect: Mood normal.        Speech: Speech normal.        Behavior: Behavior normal. Behavior is cooperative.        Thought Content: Thought content normal.     Results for orders placed or performed in visit on 08/05/24  Basic metabolic panel with GFR   Collection Time: 08/05/24 11:33 AM  Result Value Ref Range   Glucose 213 (H) 70 - 99 mg/dL   BUN 16 8 - 27 mg/dL   Creatinine, Ser 9.22 0.57 - 1.00 mg/dL   eGFR 74 >40 fO/fpw/8.26   BUN/Creatinine Ratio 21 12 - 28   Sodium 139 134 - 144 mmol/L   Potassium 4.8 3.5 - 5.2 mmol/L   Chloride 104 96 -  106 mmol/L   CO2 18 (L) 20 - 29 mmol/L    Calcium  9.7 8.7 - 10.3 mg/dL  CBC   Collection Time: 08/05/24 11:33 AM  Result Value Ref Range   WBC 10.1 3.4 - 10.8 x10E3/uL   RBC 5.05 3.77 - 5.28 x10E6/uL   Hemoglobin 13.4 11.1 - 15.9 g/dL   Hematocrit 55.4 65.9 - 46.6 %   MCV 88 79 - 97 fL   MCH 26.5 (L) 26.6 - 33.0 pg   MCHC 30.1 (L) 31.5 - 35.7 g/dL   RDW 79.1 (H) 88.2 - 84.5 %   Platelets 321 150 - 450 x10E3/uL      Assessment & Plan:   Problem List Items Addressed This Visit       Cardiovascular and Mediastinum   Longstanding persistent atrial fibrillation (HCC)   Chronic, ongoing. Currently on Plavix  and Eliquis , monitor this closely with recent falls. May benefit discontinuation if ongoing falls present due to risk for bleeding. Continue to collaborate with cardiology.      Hypertension associated with diabetes (HCC)   Chronic, ongoing. BP well at goal today. Recommend she monitor BP at least a few mornings a week at home and document.  DASH diet at home.  Continue current medication regimen and adjust as needed.  Labs today: CBC, CMP.  Monitor closely, may need to reduce medications if ongoing falls. Continue to collaborate with cardiology.       Relevant Orders   Bayer DCA Hb A1c Waived   CBC with Differential/Platelet   Comprehensive metabolic panel with GFR   TSH   Heart failure with recovered ejection fraction (HFrecEF) (HCC)   Chronic, ongoing. Euvolemic today. Continue to collaborate with cardiology and HF Clinic. Monitor falls closely, may need to reduce medications in future. Recommend: - Reminded to call for an overnight weight gain of >2 pounds or a weekly weight gain of >5 pounds - not adding salt to food and read food labels. Reviewed the importance of keeping daily sodium intake to 2000mg  daily.  - Avoid Ibuprofen        Respiratory   Obstructive sleep apnea   Chronic, not using CPAP. Consider checking for new mask in future. At present use of equipment may place higher risk for falls since it  was coming off often.        Endocrine   Type 2 diabetes mellitus with peripheral neuropathy (HCC) - Primary   Chronic, ongoing. April A1c 8.2%, close to goal for age. Per recommendations goal A1c for Diannah is <8% due to advanced age. Will recheck today. Continue current medication regimen for now, but would benefit insulin  discontinuation or reduction if levels stable.  Then continue Ozempic . - No current ACE/ARB or Statin - Vaccines, needs flu vaccine future visit - Needs eye exam. Foot exam up to date.      Relevant Medications   glucose blood (ACCU-CHEK GUIDE TEST) test strip   glucose blood (ACCU-CHEK GUIDE TEST) test strip   Blood Glucose Monitoring Suppl (ACCU-CHEK GUIDE ME) w/Device KIT   Other Relevant Orders   Bayer DCA Hb A1c Waived   Hyperlipidemia associated with type 2 diabetes mellitus (HCC)   Chronic, ongoing. No current statin on medication list. Will discuss at future visits, due to advanced age and recent falls -- medication minimization is beneficial.      Relevant Orders   Bayer DCA Hb A1c Waived   Comprehensive metabolic panel with GFR   Lipid Panel w/o Chol/HDL Ratio  Nervous and Auditory   Late onset Alzheimer's disease without behavioral disturbance (HCC)   Chronic, progressive. May benefit return to neurology in future.  Continue Aricept  and monitor her current fall risk closely, has had two falls over past several months.  Continue PT at [facility].        Other   Multiple open wounds of foot   To right second toe and medial left foot.  Will get into podiatry for further assessment, concern for worsening with her diabetes and circulation.  Order Manuka Honey to place on areas, then apply non stick dressing.  She will have nurse at [facility] assist.      Relevant Orders   Ambulatory referral to Podiatry   At high risk for falls   Two falls in past months, one with hip fracture and another with patella fracture. She is on Eliquis  and Plavix , will  need to monitor this closely and may benefit discontinuation of this in future if ongoing falls due to risk for bleeding. Continue PT at [facility] + using walker. Minimize medications when possible.        Follow up plan: Return in about 4 weeks (around 11/10/2024) for T2DM -- check foot wounds.

## 2024-10-13 NOTE — Assessment & Plan Note (Signed)
 Chronic, not using CPAP. Consider checking for new mask in future. At present use of equipment may place higher risk for falls since it was coming off often.

## 2024-10-13 NOTE — Assessment & Plan Note (Signed)
 Chronic, ongoing. Euvolemic today. Continue to collaborate with cardiology and HF Clinic. Monitor falls closely, may need to reduce medications in future. Recommend: - Reminded to call for an overnight weight gain of >2 pounds or a weekly weight gain of >5 pounds - not adding salt to food and read food labels. Reviewed the importance of keeping daily sodium intake to 2000mg  daily.  - Avoid Ibuprofen

## 2024-10-13 NOTE — Assessment & Plan Note (Signed)
 Chronic, ongoing. No current statin on medication list. Will discuss at future visits, due to advanced age and recent falls -- medication minimization is beneficial.

## 2024-10-13 NOTE — Assessment & Plan Note (Signed)
 Chronic, ongoing. BP well at goal today. Recommend she monitor BP at least a few mornings a week at home and document.  DASH diet at home.  Continue current medication regimen and adjust as needed.  Labs today: CBC, CMP.  Monitor closely, may need to reduce medications if ongoing falls. Continue to collaborate with cardiology.

## 2024-10-13 NOTE — Assessment & Plan Note (Signed)
 Chronic, progressive. May benefit return to neurology in future.  Continue Aricept  and monitor her current fall risk closely, has had two falls over past several months.  Continue PT at [facility].

## 2024-10-13 NOTE — Assessment & Plan Note (Signed)
 To right second toe and medial left foot.  Will get into podiatry for further assessment, concern for worsening with her diabetes and circulation.  Order Manuka Honey to place on areas, then apply non stick dressing.  She will have nurse at [facility] assist.

## 2024-10-14 ENCOUNTER — Telehealth: Payer: Self-pay | Admitting: Pediatrics

## 2024-10-14 ENCOUNTER — Telehealth: Payer: Self-pay

## 2024-10-14 DIAGNOSIS — R2681 Unsteadiness on feet: Secondary | ICD-10-CM | POA: Diagnosis not present

## 2024-10-14 DIAGNOSIS — R2689 Other abnormalities of gait and mobility: Secondary | ICD-10-CM | POA: Diagnosis not present

## 2024-10-14 DIAGNOSIS — R296 Repeated falls: Secondary | ICD-10-CM | POA: Diagnosis not present

## 2024-10-14 DIAGNOSIS — S72001D Fracture of unspecified part of neck of right femur, subsequent encounter for closed fracture with routine healing: Secondary | ICD-10-CM | POA: Diagnosis not present

## 2024-10-14 LAB — COMPREHENSIVE METABOLIC PANEL WITH GFR
ALT: 11 IU/L (ref 0–32)
AST: 16 IU/L (ref 0–40)
Albumin: 4.5 g/dL (ref 3.7–4.7)
Alkaline Phosphatase: 111 IU/L (ref 48–129)
BUN/Creatinine Ratio: 17 (ref 12–28)
BUN: 12 mg/dL (ref 8–27)
Bilirubin Total: 0.4 mg/dL (ref 0.0–1.2)
CO2: 21 mmol/L (ref 20–29)
Calcium: 9.6 mg/dL (ref 8.7–10.3)
Chloride: 103 mmol/L (ref 96–106)
Creatinine, Ser: 0.69 mg/dL (ref 0.57–1.00)
Globulin, Total: 2.2 g/dL (ref 1.5–4.5)
Glucose: 172 mg/dL — ABNORMAL HIGH (ref 70–99)
Potassium: 4.4 mmol/L (ref 3.5–5.2)
Sodium: 140 mmol/L (ref 134–144)
Total Protein: 6.7 g/dL (ref 6.0–8.5)
eGFR: 83 mL/min/1.73 (ref >59)

## 2024-10-14 LAB — CBC WITH DIFFERENTIAL/PLATELET
Basophils Absolute: 0.1 x10E3/uL (ref 0.0–0.2)
Basos: 1 %
EOS (ABSOLUTE): 0.2 x10E3/uL (ref 0.0–0.4)
Eos: 2 %
Hematocrit: 45.9 % (ref 34.0–46.6)
Hemoglobin: 14.8 g/dL (ref 11.1–15.9)
Immature Grans (Abs): 0 x10E3/uL (ref 0.0–0.1)
Immature Granulocytes: 0 %
Lymphocytes Absolute: 2.5 x10E3/uL (ref 0.7–3.1)
Lymphs: 26 %
MCH: 29.6 pg (ref 26.6–33.0)
MCHC: 32.2 g/dL (ref 31.5–35.7)
MCV: 92 fL (ref 79–97)
Monocytes Absolute: 0.6 x10E3/uL (ref 0.1–0.9)
Monocytes: 6 %
Neutrophils Absolute: 6.1 x10E3/uL (ref 1.4–7.0)
Neutrophils: 65 %
Platelets: 313 x10E3/uL (ref 150–450)
RBC: 5 x10E6/uL (ref 3.77–5.28)
RDW: 15.1 % (ref 11.7–15.4)
WBC: 9.3 x10E3/uL (ref 3.4–10.8)

## 2024-10-14 LAB — LIPID PANEL W/O CHOL/HDL RATIO
Cholesterol, Total: 169 mg/dL (ref 100–199)
HDL: 42 mg/dL (ref >39)
LDL Chol Calc (NIH): 92 mg/dL (ref 0–99)
Triglycerides: 206 mg/dL — ABNORMAL HIGH (ref 0–149)
VLDL Cholesterol Cal: 35 mg/dL (ref 5–40)

## 2024-10-14 LAB — TSH: TSH: 2.06 u[IU]/mL (ref 0.450–4.500)

## 2024-10-14 NOTE — Telephone Encounter (Unsigned)
 Copied from CRM [id number]. Topic: Clinical - Prescription Issue >> Oct 14, 2024 10:22 AM Zebedee SAUNDERS wrote: Reason for CRM: Received call from [facility] Phone: [phone] Fax: [phone] per Wanda stated medication is on recall and not sure when it will be available.

## 2024-10-14 NOTE — Progress Notes (Signed)
 Contacted via MyChart  Good morning Alison Richardson, your labs have returned: - Kidney function, creatinine and eGFR, remains normal, as is liver function, AST and ALT.  - Lipid panel overall stable with exception of mild elevation in triglycerides. Would continue on Pravastatin . - Remainder of labs stable.  No changes needed.  Any questions? Keep being amazing!!  Thank you for allowing me to participate in your care.  I appreciate you. Kindest regards, Sahira Cataldi

## 2024-10-14 NOTE — Telephone Encounter (Signed)
 Patient has been seen in office

## 2024-10-14 NOTE — Telephone Encounter (Unsigned)
 Copied from CRM [id number]. Topic: Clinical - Medication Question >> Oct 13, 2024  2:39 PM Antwanette L wrote: Reason for CRM: Niels, a pharmacy technician from [facility], called with questions regarding the ACCU-CHEK Guide test strips, specifically how many times per day patients typically test their blood glucose. She is requesting a callback at [phone]

## 2024-10-14 NOTE — Telephone Encounter (Signed)
 Pharmacy called again needing information.

## 2024-10-14 NOTE — Telephone Encounter (Signed)
Called and notified pharmacy

## 2024-10-15 DIAGNOSIS — S72001D Fracture of unspecified part of neck of right femur, subsequent encounter for closed fracture with routine healing: Secondary | ICD-10-CM | POA: Diagnosis not present

## 2024-10-15 DIAGNOSIS — R296 Repeated falls: Secondary | ICD-10-CM | POA: Diagnosis not present

## 2024-10-15 DIAGNOSIS — R2689 Other abnormalities of gait and mobility: Secondary | ICD-10-CM | POA: Diagnosis not present

## 2024-10-15 DIAGNOSIS — M2681 Anterior soft tissue impingement: Secondary | ICD-10-CM | POA: Diagnosis not present

## 2024-10-15 DIAGNOSIS — Z741 Need for assistance with personal care: Secondary | ICD-10-CM | POA: Diagnosis not present

## 2024-10-15 NOTE — Telephone Encounter (Signed)
 addressed

## 2024-10-16 ENCOUNTER — Other Ambulatory Visit: Payer: Self-pay | Admitting: Pediatrics

## 2024-10-16 ENCOUNTER — Ambulatory Visit: Payer: Self-pay

## 2024-10-16 DIAGNOSIS — R110 Nausea: Secondary | ICD-10-CM

## 2024-10-16 MED ORDER — ONDANSETRON 4 MG PO TBDP: 4.0000 mg | ORAL_TABLET | Freq: Three times a day (TID) | ORAL | 0 refills | Status: DC | PRN

## 2024-10-16 NOTE — Telephone Encounter (Signed)
 FYI Only or Action Required?: Action required by provider: clinical question for provider and update on patient condition.  Patient was last seen in primary care on 10/13/2024 by Alison Melanie DASEN, NP.  Called Nurse Triage reporting Vomiting.  Symptoms began yesterday.  Interventions attempted: Nothing.  Symptoms are: unchanged.  Triage Disposition: Home Care  Patient/caregiver understands and will follow disposition?: Yes      Reason for Triage: Vomiting.  Patient states Ozempic  is causing severe vomiting.        Reason for Disposition  MILD to MODERATE vomiting (e.g., 1 - 5 times / day)  Answer Assessment - Initial Assessment Questions Patient reports she stopped taking Ozempic  due to being in a nursing home and now that she has restarted it she is experiencing vomiting. She states she has vomited about 5 times in the last 24 hours. She states she would rather not come in for an appointment and wanted to see if there was any advice for her symptoms. Please advise.      1. VOMITING SEVERITY: How many times have you vomited in the past 24 hours?      5 times  2. ONSET: When did the vomiting begin?      Last night  3. FLUIDS: What fluids or food have you vomited up today? Have you been able to keep any fluids down?     Keeping fluids down  4. ABDOMEN PAIN: Are your having any abdomen pain? If Yes : How bad is it and what does it feel like? (e.g., crampy, dull, intermittent, constant)      No 5. DIARRHEA: Is there any diarrhea? If Yes, ask: How many times today?      No 6. CONTACTS: Is there anyone else in the family with the same symptoms?      No 7. CAUSE: What do you think is causing your vomiting?     Believes due to her Ozempic   8. HYDRATION STATUS: Any signs of dehydration? (e.g., dry mouth [not only dry lips], too weak to stand) When did you last urinate?     No 9. OTHER SYMPTOMS: Do you have any other symptoms? (e.g., fever,  headache, vertigo, vomiting blood or coffee grounds, recent head injury)     No  Protocols used: Vomiting-A-AH

## 2024-10-16 NOTE — Telephone Encounter (Signed)
 Duplicate CRM, see other nurse triage encounter

## 2024-10-16 NOTE — Progress Notes (Signed)
 Sending zofran  for nausea/vomiting 2/2 ozempic .  Alison SHAUNNA Nett, MD

## 2024-10-16 NOTE — Telephone Encounter (Signed)
 Patient notified

## 2024-10-19 ENCOUNTER — Ambulatory Visit: Admitting: Podiatry

## 2024-10-19 VITALS — Ht 66.0 in | Wt 158.0 lb

## 2024-10-19 DIAGNOSIS — L97501 Non-pressure chronic ulcer of other part of unspecified foot limited to breakdown of skin: Secondary | ICD-10-CM | POA: Diagnosis not present

## 2024-10-19 DIAGNOSIS — R2681 Unsteadiness on feet: Secondary | ICD-10-CM | POA: Diagnosis not present

## 2024-10-19 DIAGNOSIS — R2689 Other abnormalities of gait and mobility: Secondary | ICD-10-CM | POA: Diagnosis not present

## 2024-10-19 DIAGNOSIS — M2042 Other hammer toe(s) (acquired), left foot: Secondary | ICD-10-CM | POA: Diagnosis not present

## 2024-10-19 DIAGNOSIS — E11621 Type 2 diabetes mellitus with foot ulcer: Secondary | ICD-10-CM

## 2024-10-19 DIAGNOSIS — M2041 Other hammer toe(s) (acquired), right foot: Secondary | ICD-10-CM | POA: Diagnosis not present

## 2024-10-19 DIAGNOSIS — R296 Repeated falls: Secondary | ICD-10-CM | POA: Diagnosis not present

## 2024-10-19 DIAGNOSIS — E1142 Type 2 diabetes mellitus with diabetic polyneuropathy: Secondary | ICD-10-CM

## 2024-10-19 DIAGNOSIS — S72001D Fracture of unspecified part of neck of right femur, subsequent encounter for closed fracture with routine healing: Secondary | ICD-10-CM | POA: Diagnosis not present

## 2024-10-19 NOTE — Progress Notes (Signed)
 Subjective:  Patient ID: Alison Richardson, female    DOB: 19-Nov-1933,  MRN: [id number]  Chief Complaint  Patient presents with   Wound Check    Rm 8 Patient is here for a small wound/sore on the right 2nd toe and left great toe. All wounds are scabbed over with no drainage. Patient is requesting a referral for diabetic shoes.    Discussed the use of AI scribe software for clinical note transcription with the patient, who gave verbal consent to proceed.  History of Present Illness Alison Richardson is an 89 year old female with diabetes who presents with healing foot ulcers.  She developed sores on her toes approximately two weeks ago while residing in a nursing home. The ulcers appeared during a period of limited mobility, as she was primarily getting out of bed only to use the toilet. Initially, the sores were 'juicy', but they are now healing well. She is concerned about the redness along the side of her foot.  Her diabetes is relevant to her current foot condition. Her most recent A1c was 7.3, checked a couple of weeks ago. She is not currently applying any treatment to the ulcers, but had been using topical applications until a few days ago. She mentions needing a new referral for diabetic shoes, as her current pair, obtained less than a year ago, are now too small.      Objective:    Physical Exam VASCULAR: DP and PT pulse palpable. Foot is cool to touch at toes but has normal capillary fill time DERMATOLOGIC: Partial thickness ulcers healing well without infection. Right second toe and left first MTP joint without active drainage or cellulitis. NEUROLOGIC: Normal sensation to light touch and pressure. No paresthesias on examination. ORTHOPEDIC: Digital contractures, hallux valgus forming, diffuse peripheral neuropathy. Smooth pain-free range of motion of all examined joints.     No images are attached to the encounter.    Results LABS   A1c: 7.3 (10/05/2024)   Assessment:   1.  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 Diabetic polyneuropathy associated with type 2 diabetes mellitus (HCC)   3. Hammertoes of both feet      Plan:  Patient was evaluated and treated and all questions answered.  Assessment and Plan Assessment & Plan Diabetic foot ulcers (right second toe and left first MTP joint), healing Partial thickness ulcers on the right second toe and left first MTP joint are healing well without signs of infection. No active drainage or cellulitis. Warm and well perfused foot. Capillary fill time intact. Healing expected to continue with time. - Apply Neosporin or Vaseline daily to prevent drying. - Leave ulcers open to air unless new drainage, redness, swelling, or signs of infection occur. - Monitor for signs of infection and follow up if necessary.  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 Recent A1c was 7.3, indicating good control. Peripheral neuropathy present with digital contractures and hallux valgus. - Provided referral for diabetic shoes to [employer] in [place]. These are medically necessary with her neuropathy to reduce the risk of re ulceration and with her deformity require extra depth shoes - Sent referral and notes to [employer]. - Provided contact information for [facility] for scheduling.  Chronic venous insufficiency with stasis skin changes Chronic venous insufficiency with stasis skin changes. Discoloration noted, likely cosmetic and related to vein dysfunction.  No significant concern at this time. - Continue to monitor for any changes or worsening of symptoms.      Return if symptoms worsen or fail to improve.

## 2024-10-19 NOTE — Patient Instructions (Addendum)
  VISIT SUMMARY: Today, we discussed the healing of your foot ulcers, your diabetes management, and your chronic venous insufficiency. Your foot ulcers are healing well, and we have provided a plan to continue this progress. We also reviewed your diabetes control and provided a referral for new diabetic shoes. Lastly, we discussed your chronic venous insufficiency and the associated skin changes.  YOUR PLAN: -DIABETIC FOOT ULCERS: Diabetic foot ulcers are sores that occur on the feet of people with diabetes, often due to poor circulation and nerve damage. Your ulcers are healing well without signs of infection. You should apply Neosporin or Vaseline daily to prevent drying and leave the ulcers open to air unless you notice new drainage, redness, swelling, or signs of infection. Monitor for any signs of infection and follow up if necessary.  -TYPE 2 DIABETES MELLITUS WITH PERIPHERAL NEUROPATHY: Type 2 diabetes is a condition where your body does not use insulin  properly, leading to high blood sugar levels. Peripheral neuropathy is nerve damage that can cause numbness and pain in your extremities. Your recent A1c was 7.3, indicating good control of your diabetes. We have provided a referral for diabetic shoes to [employer] in [place] and sent them the necessary notes. Please contact [facility] to schedule an appointment.  -CHRONIC VENOUS INSUFFICIENCY WITH STASIS SKIN CHANGES: Chronic venous insufficiency is a condition where the veins in your legs do not work effectively, leading to blood pooling and skin changes. The discoloration noted is likely cosmetic and related to vein dysfunction. There is no significant concern at this time, but continue to monitor for any changes or worsening of symptoms.  INSTRUCTIONS: Please contact [facility] in [place] to schedule an appointment for your new diabetic shoes. Monitor your foot ulcers for any signs of infection and  follow up if necessary.                      Contains text generated by Abridge.                                 Contains text generated by Abridge.

## 2024-10-20 ENCOUNTER — Other Ambulatory Visit: Payer: Self-pay | Admitting: Pediatrics

## 2024-10-20 ENCOUNTER — Ambulatory Visit: Admitting: Pediatrics

## 2024-10-20 ENCOUNTER — Ambulatory Visit: Payer: Self-pay

## 2024-10-20 DIAGNOSIS — R296 Repeated falls: Secondary | ICD-10-CM | POA: Diagnosis not present

## 2024-10-20 DIAGNOSIS — Z79899 Other long term (current) drug therapy: Secondary | ICD-10-CM

## 2024-10-20 DIAGNOSIS — G629 Polyneuropathy, unspecified: Secondary | ICD-10-CM

## 2024-10-20 DIAGNOSIS — M2681 Anterior soft tissue impingement: Secondary | ICD-10-CM | POA: Diagnosis not present

## 2024-10-20 DIAGNOSIS — Z741 Need for assistance with personal care: Secondary | ICD-10-CM | POA: Diagnosis not present

## 2024-10-20 DIAGNOSIS — R2689 Other abnormalities of gait and mobility: Secondary | ICD-10-CM | POA: Diagnosis not present

## 2024-10-20 DIAGNOSIS — S72001D Fracture of unspecified part of neck of right femur, subsequent encounter for closed fracture with routine healing: Secondary | ICD-10-CM | POA: Diagnosis not present

## 2024-10-20 DIAGNOSIS — R42 Dizziness and giddiness: Secondary | ICD-10-CM

## 2024-10-20 NOTE — Telephone Encounter (Signed)
 FYI Only or Action Required?: FYI only for provider.  Patient was last seen in primary care on 10/13/2024 by Valerio Melanie DASEN, NP.  Called Nurse Triage reporting Dizziness.  Symptoms began yesterday.  Interventions attempted: Nothing.  Symptoms are: unchanged.  Triage Disposition: Go to ED Now (or PCP Triage)  Patient/caregiver understands and will follow disposition?: No  Copied from CRM #[id number]. Topic: Clinical - Red Word Triage >> Oct 20, 2024  8:20 AM Larissa RAMAN wrote: [facility] that prompted transfer to Nurse Triage: lightheaded, dizziness, pain in back, bp 131/88 Reason for Disposition  [1] Dizziness (vertigo) present now AND [2] age > 64  (Exception: Prior doctor or NP/PA evaluation for this AND no different/worse than usual.)  Answer Assessment - Initial Assessment Questions 1. DESCRIPTION: Describe your dizziness.     When I move I feel like my head wants to go over, feels unsteady when walking 2. VERTIGO: Do you feel like either you or the room is spinning or tilting?      Only when I move my head 3. LIGHTHEADED: Do you feel lightheaded? (e.g., somewhat faint, woozy, weak upon standing)     Pt states that she feels like she could pass out with the dizziness 4. SEVERITY: How bad is it?  Can you walk?     Can walk, using walker as normal 5. ONSET:  When did the dizziness begin?     Last night 6. AGGRAVATING FACTORS: Does anything make it worse? (e.g., standing, change in head position)     Change in head position 7. CAUSE: What do you think is causing the dizziness?     unsure 8. RECURRENT SYMPTOM: Have you had dizziness before? If Yes, ask: When was the last time? What happened that time?     Has not had this before 9. OTHER SYMPTOMS: Do you have any other symptoms? (e.g., earache, headache, numbness, tinnitus, vomiting, weakness)     Denies vision changes, denies CMS changes, equal smile,  Pt offered appt today, refused. Pt advised to  seek ED treatment, pt refused.  Protocols used: Dizziness - Vertigo-A-AH

## 2024-10-20 NOTE — Telephone Encounter (Signed)
 FYI- patient refused appointment today and ER visit. Has appointment scheduled for tomorrow.

## 2024-10-20 NOTE — Telephone Encounter (Signed)
 Denied gabapentin  refills. Has appointment tomorrow 10/29, will discuss.   Alison SHAUNNA Nett, MD

## 2024-10-21 ENCOUNTER — Encounter: Payer: Self-pay | Admitting: Pediatrics

## 2024-10-21 ENCOUNTER — Ambulatory Visit: Admitting: Pediatrics

## 2024-10-21 ENCOUNTER — Telehealth: Payer: Self-pay | Admitting: Pediatrics

## 2024-10-21 VITALS — BP 92/61 | HR 91 | Temp 97.0°F | Ht 66.0 in

## 2024-10-21 DIAGNOSIS — Z7985 Long-term (current) use of injectable non-insulin antidiabetic drugs: Secondary | ICD-10-CM

## 2024-10-21 DIAGNOSIS — R2681 Unsteadiness on feet: Secondary | ICD-10-CM | POA: Diagnosis not present

## 2024-10-21 DIAGNOSIS — E1142 Type 2 diabetes mellitus with diabetic polyneuropathy: Secondary | ICD-10-CM

## 2024-10-21 DIAGNOSIS — S72001D Fracture of unspecified part of neck of right femur, subsequent encounter for closed fracture with routine healing: Secondary | ICD-10-CM | POA: Diagnosis not present

## 2024-10-21 DIAGNOSIS — E1159 Type 2 diabetes mellitus with other circulatory complications: Secondary | ICD-10-CM

## 2024-10-21 DIAGNOSIS — I152 Hypertension secondary to endocrine disorders: Secondary | ICD-10-CM

## 2024-10-21 DIAGNOSIS — R112 Nausea with vomiting, unspecified: Secondary | ICD-10-CM | POA: Diagnosis not present

## 2024-10-21 DIAGNOSIS — R110 Nausea: Secondary | ICD-10-CM

## 2024-10-21 DIAGNOSIS — R296 Repeated falls: Secondary | ICD-10-CM | POA: Diagnosis not present

## 2024-10-21 DIAGNOSIS — G4700 Insomnia, unspecified: Secondary | ICD-10-CM

## 2024-10-21 DIAGNOSIS — R2689 Other abnormalities of gait and mobility: Secondary | ICD-10-CM | POA: Diagnosis not present

## 2024-10-21 MED ORDER — ONDANSETRON 4 MG PO TBDP: 4.0000 mg | ORAL_TABLET | Freq: Three times a day (TID) | ORAL | 0 refills | Status: DC | PRN

## 2024-10-21 MED ORDER — SEMAGLUTIDE(0.25 OR 0.5MG/DOS) 2 MG/3ML ~~LOC~~ SOPN: 0.2500 mg | PEN_INJECTOR | SUBCUTANEOUS | 0 refills | Status: DC

## 2024-10-21 MED ORDER — TRAZODONE HCL 50 MG PO TABS: 25.0000 mg | ORAL_TABLET | Freq: Every evening | ORAL | 3 refills | Status: DC | PRN

## 2024-10-21 MED ORDER — OZEMPIC (0.25 OR 0.5 MG/DOSE) 2 MG/1.5ML ~~LOC~~ SOPN: 0.5000 mg | PEN_INJECTOR | SUBCUTANEOUS | 1 refills | Status: DC

## 2024-10-21 NOTE — Telephone Encounter (Signed)
 Copied from CRM [id number]. Topic: Clinical - Prescription Issue >> Oct 21, 2024  3:23 PM Emylou G wrote: Reason for CRM: [facility] call back [phone].. rcvd 2 ozempic  scrips.SABRA which is correct to fill

## 2024-10-21 NOTE — Progress Notes (Signed)
 Office Visit  BP 92/61   Pulse 91   Temp (!) 97 F (36.1 C) (Oral)   Ht 5' 6 (1.676 m)   SpO2 94%   BMI 25.50 kg/m    Subjective:    Patient ID: Alison Richardson, female    DOB: [birth year], 89 y.o.   MRN: [id number]  HPI: Alison Richardson is a 89 y.o. female  Chief Complaint  Patient presents with   Emesis    For 2/3 days due to side effects to Ozempic . Last Wednesday. Thursday into Friday bad., Saturday Mild. Stopped then and started back yesterday. Very mild.     Discussed the use of AI scribe software for clinical note transcription with the patient, who gave verbal consent to proceed.  History of Present Illness   Alison Richardson is an 89 year old female with diabetes who presents with nausea after restarting Ozempic .  She experiences significant nausea after restarting Ozempic , which she had discontinued for two months. Upon resuming the medication at the previous dose, she developed severe nausea lasting two days. Initially, she started Ozempic  to better control her blood sugar levels, which are currently stable with an A1c of 7.3. She has lost a significant amount of weight recently and feels weaker.  She was recently hospitalized but did not receive Ozempic  or insulin  during her stay. While admitted, she was on insulin  daily but is no longer taking it. Her blood sugar levels have remained stable since then.  She has a history of a broken hip and patella, which limits her physical activity and affects her participation in physical therapy. She has no energy and poor sleep, describing her sleep as restless. She is currently taking gabapentin  100 mg at night, which was increased to 200 mg by her foot doctor but caused dizziness, so she returned to the lower dose. She experiences neuropathy in her big toe, which is not relieved by gabapentin .  Her blood pressure has been fluctuating, with recent readings as low as 92/61 and as high as 132/133. She has been losing weight.      Relevant past medical, surgical, family and social history reviewed and updated as indicated. Interim medical history since our last visit reviewed. Allergies and medications reviewed and updated.  ROS per HPI unless specifically indicated above     Objective:    BP 92/61   Pulse 91   Temp (!) 97 F (36.1 C) (Oral)   Ht 5' 6 (1.676 m)   SpO2 94%   BMI 25.50 kg/m   Wt Readings from Last 3 Encounters:  10/19/24 158 lb (71.7 kg)  08/22/24 158 lb (71.7 kg)  08/05/24 157 lb (71.2 kg)     Physical Exam Constitutional:      Appearance: Normal appearance.  Pulmonary:     Effort: Pulmonary effort is normal.  Musculoskeletal:        General: Normal range of motion.  Skin:    Comments: Normal skin color  Neurological:     General: No focal deficit present.     Mental Status: She is alert. Mental status is at baseline.  Psychiatric:        Mood and Affect: Mood normal.        Behavior: Behavior normal.        Thought Content: Thought content normal.         05/05/2024    1:25 PM 04/02/2024    4:01 PM 04/09/2023   11:04 AM 01/15/2023    3:47 PM 08/21/2022  1:47 PM  Depression screen PHQ 2/9  Decreased Interest 0 -- 0 0 0  Down, Depressed, Hopeless 0  0 0 0  PHQ - 2 Score 0  0 0 0  Altered sleeping 0  0 3   Tired, decreased energy 0  0 3   Change in appetite 0  0 0   Feeling bad or failure about yourself  0  0 0   Trouble concentrating 0  0 0   Moving slowly or fidgety/restless 0  0 0   Suicidal thoughts 0  0 0   PHQ-9 Score 0  0 6   Difficult doing work/chores   Not difficult at all Not difficult at all        05/05/2024    1:25 PM 09/10/2023    5:03 PM 01/15/2023    3:48 PM 08/21/2022    1:48 PM  GAD 7 : Generalized Anxiety Score  Nervous, Anxious, on Edge 0  0 0  Control/stop worrying 0  0 1  Worry too much - different things 0  0 1  Trouble relaxing 0  0 0  Restless 0  0 0  Easily annoyed or irritable 0  0 0  Afraid - awful might happen 0  0 0  Total GAD  7 Score 0  0 2  Anxiety Difficulty  Somewhat difficult Not difficult at all Somewhat difficult       Assessment & Plan:  Assessment & Plan   Type 2 diabetes mellitus with peripheral neuropathy (HCC) Recent Ozempic  restart caused nausea and vomiting. A1c at 7.3 indicates reasonable control. Neuropathy persists, especially in the big toe. - Prescribed Ozempic  0.25 mg for 4 weeks, then increase to 0.5 mg for 4 weeks, and finally to 1 mg as tolerated. - Discontinued gabapentin  due to inefficacy and potential fatigue. - Consider Lyrica for neuropathy if symptoms persist after addressing sleep issues. -     Semaglutide (0.25 or 0.5MG /DOS); Inject 0.25 mg into the skin once a week.  Dispense: 3 mL; Refill: 0 -     Ozempic  (0.25 or 0.5 MG/DOSE); Inject 0.5 mg into the skin once a week. Start with 0.25MG  once a week x 4 weeks, then increase to 0.5MG  weekly.  Dispense: 1.5 mL; Refill: 1   Hypertension associated with diabetes (HCC) Recent weight loss may affect blood pressure control. Fluctuating readings with some low values. - Monitor blood pressure closely. - Consider discontinuing spironolactone  if low readings persist.   Nausea and vomiting, unspecified vomiting type Attributed to Ozempic  restart at higher dose after hiatus. Expected to resolve with dose adjustment. - Adjust Ozempic  dosing to minimize gastrointestinal side effects. -     Ondansetron ; Take 1 tablet (4 mg total) by mouth every 8 (eight) hours as needed for nausea or vomiting.  Dispense: 20 tablet; Refill: 0  Insomnia, unspecified type Chronic insomnia with ineffective gabapentin  use, contributing to fatigue. Not pain-related. - Prescribed trazodone  50 mg for sleep, start with half a tablet and adjust as needed. - Discontinued gabapentin . -     traZODone  HCl; Take 0.5-1 tablets (25-50 mg total) by mouth at bedtime as needed for sleep.  Dispense: 30 tablet; Refill: 3    Follow up plan: SCHEDULED IN 2-3 WEEKS.  Hadassah SHAUNNA Nett, MD

## 2024-10-21 NOTE — Patient Instructions (Signed)
 Changes: - reduce ozempic  to 0.25mg  once weekly. After 4 weeks, increase to 0.5mg . After 4 weeks can go back on 1mg .  - stop gabapentin   - start trazadone 25-50mg  for sleep  If blood pressures <90/60 at home, will probably stop the spironolactone  next visit. You can stop this at home if you're seeing low readings.

## 2024-10-22 DIAGNOSIS — M2681 Anterior soft tissue impingement: Secondary | ICD-10-CM | POA: Diagnosis not present

## 2024-10-22 DIAGNOSIS — R296 Repeated falls: Secondary | ICD-10-CM | POA: Diagnosis not present

## 2024-10-22 DIAGNOSIS — S72001D Fracture of unspecified part of neck of right femur, subsequent encounter for closed fracture with routine healing: Secondary | ICD-10-CM | POA: Diagnosis not present

## 2024-10-22 DIAGNOSIS — R2689 Other abnormalities of gait and mobility: Secondary | ICD-10-CM | POA: Diagnosis not present

## 2024-10-22 DIAGNOSIS — Z741 Need for assistance with personal care: Secondary | ICD-10-CM | POA: Diagnosis not present

## 2024-10-22 NOTE — Telephone Encounter (Signed)
 Stopped at visit this week.

## 2024-10-22 NOTE — Telephone Encounter (Signed)
 Requested medications are due for refill today.  unsure  Requested medications are on the active medications list.  yes  Last refill. 09/10/2023  Future visit scheduled.   yes  Notes to clinic.  Sig on med list differs from request. Medication is historical. At yesterday's ov Gabapentin  was d/c'd.    Requested Prescriptions  Pending Prescriptions Disp Refills   gabapentin  (NEURONTIN ) 100 MG capsule [Pharmacy Med Name: GABAPENTIN  100 MG CAP] 180 capsule     Sig: TAKE 2 CAPSULE BY MOUTH TWICE DAILY     Neurology: Anticonvulsants - gabapentin  Passed - 10/22/2024 10:51 AM      Passed - Cr in normal range and within 360 days    Creatinine, Ser  Date Value Ref Range Status  10/13/2024 0.69 0.57 - 1.00 mg/dL Final         Passed - Completed PHQ-2 or PHQ-9 in the last 360 days      Passed - Valid encounter within last 12 months    Recent Outpatient Visits           Yesterday Type 2 diabetes mellitus with peripheral neuropathy [facility])   [facility] Herold Hadassah SQUIBB, MD   1 week ago Type 2 diabetes mellitus with peripheral neuropathy (HCC)   [place] [facility] [place], [place] T, NP   4 months ago Chronic diastolic CHF (congestive heart failure) [facility])   [place] [facility] Herold Hadassah SQUIBB, MD   5 months ago Chest discomfort   [place] [facility] Herold Hadassah SQUIBB, MD   5 months ago Type 2 diabetes mellitus with peripheral neuropathy [facility])    [facility] Melvin Pao, NP

## 2024-10-23 ENCOUNTER — Ambulatory Visit: Payer: Self-pay

## 2024-10-23 ENCOUNTER — Telehealth: Payer: Self-pay | Admitting: Pediatrics

## 2024-10-23 DIAGNOSIS — S72001D Fracture of unspecified part of neck of right femur, subsequent encounter for closed fracture with routine healing: Secondary | ICD-10-CM | POA: Diagnosis not present

## 2024-10-23 DIAGNOSIS — R296 Repeated falls: Secondary | ICD-10-CM | POA: Diagnosis not present

## 2024-10-23 DIAGNOSIS — R2681 Unsteadiness on feet: Secondary | ICD-10-CM | POA: Diagnosis not present

## 2024-10-23 DIAGNOSIS — R2689 Other abnormalities of gait and mobility: Secondary | ICD-10-CM | POA: Diagnosis not present

## 2024-10-23 NOTE — Telephone Encounter (Signed)
 FYI Only or Action Required?: Action required by provider: clinical question for provider and update on patient condition.  Patient was last seen in primary care on 10/21/2024 by Alison Hadassah SQUIBB, MD.  Called Nurse Triage reporting Vomiting.  Symptoms began a week ago but started back vomiting yesterday after not vomiting the day before.  Interventions attempted: Rest, hydration, or home remedies.  Symptoms are: patient states she feels okay at this time but did vomit just prior to this nurse triaging her.  Triage Disposition: Go to ED Now (or PCP Triage)  Patient/caregiver understands and will follow disposition?: No, wishes to speak with PCP            Copied from CRM #[id number]. Topic: Clinical - Red Word Triage >> Oct 23, 2024 10:50 AM Wess RAMAN wrote: Red Word that prompted transfer to Nurse Triage: Patient was prescribed traZODone  (DESYREL ) 50 MG tablet and stated it is not working.  She also ate peanuts and threw up. She believes she had a allergic reaction and is also nauseous, dehydrated, and currently vomiting. Reason for Disposition  High-risk adult (e.g., diabetes mellitus, brain tumor, V-P shunt, hernia)  Answer Assessment - Initial Assessment Questions Caregiver, Avelina Gowers, calling and is right next to the patient Wed--pt came in office and was dehydrated and they were adjusting medications. Patient had taken her Ozempic  shot last week for the first time in two months.  Vomited after eating peanuts yesterday and vomited this morning just prior to triage with this RN after eating a breakfast bar and drinking glucerna. Patient states she was able to eat last night and keep that down Patient took her first dose of Trazadone--this made patient antsy and restless Patient is supposed to start on a lower dose of Ozempic  but they wanted to get her back to feeling better prior to starting that Blood pressure was normal per caregiver Caregiver Avelina states that  they havent checked the patient's blood sugar this morning but she states the patient is not experiencing any dizziness or lightheadedness at this time Physical Therapy is also present at the home with the patient speaking with her as well Patient does state that she feels very weak She is advised that the Emergency Room would be recommended at this time for her symptoms and medical history. Patient states that she feels okay at this time and doesn't want to go to the Emergency Room Patient and her caregiver wanted this information sent to the patient's PCP to see what they further advise on this and on the Trazadone medication not helping her sleep last night Patient was advised if they had any further questions or concerns or anything changes to call us  back and also if she felt worse to go to the ER Patient verbalized understanding.          1. VOMITING SEVERITY: How many times have you vomited in the past 24 hours?      2 2. ONSET: When did the vomiting begin?      yesterday 3. FLUIDS: What fluids or food have you vomited up today? Have you been able to keep any fluids down?     ----- 4. ABDOMEN PAIN: Are your having any abdomen pain? If Yes : How bad is it and what does it feel like? (e.g., crampy, dull, intermittent, constant)      ---- 5. DIARRHEA: Is there any diarrhea? If Yes, ask: How many times today?      No 6. CONTACTS: Is there anyone  else in the family with the same symptoms?      ---- 7. CAUSE: What do you think is causing your vomiting?     unsure 8. HYDRATION STATUS: Any signs of dehydration? (e.g., dry mouth [not only dry lips], too weak to stand) When did you last urinate?     Been drinking pretty good 9. OTHER SYMPTOMS: Do you have any other symptoms? (e.g., fever, headache, vertigo, vomiting blood or coffee grounds, recent head injury)     -----  Protocols used: Vomiting-A-AH

## 2024-10-23 NOTE — Telephone Encounter (Signed)
 Called [facility] to advise them of patient's symptoms and ER refusal at this time

## 2024-10-23 NOTE — Telephone Encounter (Signed)
 Copied from CRM #[id number]. Topic: Clinical - Order For Equipment >> Oct 23, 2024 11:44 AM Tiffini S wrote: Reason for CRM: Avelina Minor (patient care giver) called stating that the Patient is taking her blood sugar by pricking her finger with a pen Patient needs a easier form to check her blood sugar level for a Dexcom receiver and sensors  Please call the patient at [phone]

## 2024-10-26 ENCOUNTER — Encounter: Payer: Self-pay | Admitting: Pediatrics

## 2024-10-26 DIAGNOSIS — R296 Repeated falls: Secondary | ICD-10-CM | POA: Diagnosis not present

## 2024-10-26 DIAGNOSIS — S72001D Fracture of unspecified part of neck of right femur, subsequent encounter for closed fracture with routine healing: Secondary | ICD-10-CM | POA: Diagnosis not present

## 2024-10-26 DIAGNOSIS — R2689 Other abnormalities of gait and mobility: Secondary | ICD-10-CM | POA: Diagnosis not present

## 2024-10-26 DIAGNOSIS — R2681 Unsteadiness on feet: Secondary | ICD-10-CM | POA: Diagnosis not present

## 2024-10-26 NOTE — Telephone Encounter (Signed)
 Advised to start with the 0.25 mg prescription, left message on voicemail.

## 2024-10-27 DIAGNOSIS — M2681 Anterior soft tissue impingement: Secondary | ICD-10-CM | POA: Diagnosis not present

## 2024-10-27 DIAGNOSIS — S72001D Fracture of unspecified part of neck of right femur, subsequent encounter for closed fracture with routine healing: Secondary | ICD-10-CM | POA: Diagnosis not present

## 2024-10-27 DIAGNOSIS — R296 Repeated falls: Secondary | ICD-10-CM | POA: Diagnosis not present

## 2024-10-27 DIAGNOSIS — Z741 Need for assistance with personal care: Secondary | ICD-10-CM | POA: Diagnosis not present

## 2024-10-27 DIAGNOSIS — R2689 Other abnormalities of gait and mobility: Secondary | ICD-10-CM | POA: Diagnosis not present

## 2024-10-27 NOTE — Telephone Encounter (Signed)
 Unable to dispense another option such as CGM (dexcom/Libre) as patient is not on daily insulin  and insurance will not cover without that.

## 2024-10-27 NOTE — Telephone Encounter (Signed)
 Patient is feeling much better and will hold on Ozempic  and only half trazadone if needed. Moved appointment to virtual 10/29/2024.

## 2024-10-28 DIAGNOSIS — R2681 Unsteadiness on feet: Secondary | ICD-10-CM | POA: Diagnosis not present

## 2024-10-28 DIAGNOSIS — R2689 Other abnormalities of gait and mobility: Secondary | ICD-10-CM | POA: Diagnosis not present

## 2024-10-28 DIAGNOSIS — R296 Repeated falls: Secondary | ICD-10-CM | POA: Diagnosis not present

## 2024-10-28 DIAGNOSIS — S72001D Fracture of unspecified part of neck of right femur, subsequent encounter for closed fracture with routine healing: Secondary | ICD-10-CM | POA: Diagnosis not present

## 2024-10-29 ENCOUNTER — Telehealth (INDEPENDENT_AMBULATORY_CARE_PROVIDER_SITE_OTHER): Admitting: Pediatrics

## 2024-10-29 VITALS — BP 127/72 | HR 86 | Ht 66.0 in | Wt 160.0 lb

## 2024-10-29 DIAGNOSIS — M2681 Anterior soft tissue impingement: Secondary | ICD-10-CM | POA: Diagnosis not present

## 2024-10-29 DIAGNOSIS — R296 Repeated falls: Secondary | ICD-10-CM | POA: Diagnosis not present

## 2024-10-29 DIAGNOSIS — I152 Hypertension secondary to endocrine disorders: Secondary | ICD-10-CM

## 2024-10-29 DIAGNOSIS — R2689 Other abnormalities of gait and mobility: Secondary | ICD-10-CM | POA: Diagnosis not present

## 2024-10-29 DIAGNOSIS — E1159 Type 2 diabetes mellitus with other circulatory complications: Secondary | ICD-10-CM | POA: Diagnosis not present

## 2024-10-29 DIAGNOSIS — Z741 Need for assistance with personal care: Secondary | ICD-10-CM | POA: Diagnosis not present

## 2024-10-29 DIAGNOSIS — S72001D Fracture of unspecified part of neck of right femur, subsequent encounter for closed fracture with routine healing: Secondary | ICD-10-CM | POA: Diagnosis not present

## 2024-10-29 DIAGNOSIS — E1142 Type 2 diabetes mellitus with diabetic polyneuropathy: Secondary | ICD-10-CM

## 2024-10-29 MED ORDER — PREGABALIN 25 MG PO CAPS: ORAL_CAPSULE | ORAL | 1 refills | Status: DC

## 2024-10-29 NOTE — Assessment & Plan Note (Addendum)
 Blood sugars well controlled. Neuropathy persists. Discussed Ozempic  if blood sugars increase but would like to hold off given severe nausea when restarting. Pt sugars have been well controlled and has been on medication for several months so reasonable. Eye exam overdue, will schedule on her own. - Discontinued spironolactone , monitor blood pressure <140/90 mmHg. - Initiated Lyrica 25 mg at night, increase to 50 mg if tolerated. - Check blood sugar twice weekly instead of daily - Monitor blood sugar, consider Ozempic  if >200 mg/dL.

## 2024-10-29 NOTE — Patient Instructions (Signed)
 Stop spironolactone . Keep checking blood pressures, please call if bp >140/90  Continue to hold ozempic . You can check blood sugars 2x/week. Please call if >200.  Start lyrica 25mg  nightly for neuropathy. After 3-5 days if tolerating well, you can take two at night (50mg ).

## 2024-10-29 NOTE — Progress Notes (Signed)
 Telehealth Visit  I connected with  [name] on 10/29/24 by a video enabled telemedicine application and verified that I am speaking with the correct person using two identifiers.   I discussed the limitations of evaluation and management by telemedicine. The patient expressed understanding and agreed to proceed.  Subjective:    Patient ID: Alison Richardson, female    DOB: Aug 11, 1933, 89 y.o.   MRN: [id number]  HPI: Alison Richardson is a 89 y.o. female  Chief Complaint  Patient presents with   Diabetes    BGL 118 @1130am  Avg 160 fasting      Discussed the use of AI scribe software for clinical note transcription with the patient, who gave verbal consent to proceed.  History of Present Illness   Alison Richardson is an 89 year old female with neuropathy and diabetes who presents for follow-up of her neuropathy and blood sugar management.  She experiences persistent neuropathy, primarily at night, which has continued despite discontinuing gabapentin . The neuropathy has improved since stopping the medication, and she is exploring alternative management options.  Her blood pressure readings at home have been stable, with recent measurements of 127/72 mmHg and 115/72 mmHg. She is currently on a low dose of spironolactone .  In terms of diabetes management, she checks her blood sugar once daily, typically fasting, and finds the process complicated. Her A1c levels have been well-controlled. She has been off Ozempic  due to previous adverse reactions, experiencing significant issues after resuming a higher dose following a hospital stay.  She mentions it is time for her annual eye exam and plans to visit her eye doctor at [facility].     Relevant past medical, surgical, family and social history reviewed and updated as indicated. Interim medical history since our last visit reviewed. Allergies and medications reviewed and updated.  ROS per HPI unless specifically indicated above      Objective:    BP 127/72   Pulse 86   Ht 5' 6 (1.676 m)   Wt 160 lb (72.6 kg)   BMI 25.82 kg/m   Wt Readings from Last 3 Encounters:  10/29/24 160 lb (72.6 kg)  10/19/24 158 lb (71.7 kg)  08/22/24 158 lb (71.7 kg)     Physical Exam Constitutional:      General: She is not in acute distress.    Appearance: Normal appearance.  Neurological:     General: No focal deficit present.     Mental Status: She is alert. Mental status is at baseline.     LIMITED EXAM GIVEN VIDEO VISIT     Assessment & Plan:  Assessment & Plan   Type 2 diabetes mellitus with peripheral neuropathy (HCC) Assessment & Plan: Blood sugars well controlled. Neuropathy persists. Discussed Ozempic  if blood sugars increase but would like to hold off given severe nausea when restarting. Pt sugars have been well controlled and has been on medication for several months so reasonable. Eye exam overdue, will schedule on her own. - Discontinued spironolactone , monitor blood pressure <140/90 mmHg. - Initiated Lyrica 25 mg at night, increase to 50 mg if tolerated. - Check blood sugar twice weekly instead of daily - Monitor blood sugar, consider Ozempic  if >200 mg/dL.  Orders: -     Pregabalin; Take 1 capsule (25mg ) nightly for 3-5 days. If tolerating well can take two capsules (50mg ) nightly for neuropathy.  Dispense: 45 capsule; Refill: 1  Hypertension associated with diabetes [facility]) Assessment & Plan: Well controlled. Plan to hold off spironolactone  in effort to  consolidate medications.     Follow up plan: No follow-ups on file.  Hadassah SHAUNNA Nett, MD   This visit was completed via video visit through MyChart due to the restrictions of the COVID-19 pandemic. All issues as above were discussed and addressed. Physical exam was done as above through visual confirmation on video through MyChart. If it was felt that the patient should be evaluated in the office, they were directed there. The patient verbally  consented to this visit.  Location of the patient: home Location of the provider: work Those involved with this call:  Provider: Hadassah Nett, MD CMA: Michale Fogo, CMA  Time spent on call: 15 minutes with patient face to face via video conference. More than 50% of this time was spent in counseling and coordination of care. 15 minutes total spent in review of patient's record and preparation of their chart. Total time spent on this encounter: 30 minutes.

## 2024-10-29 NOTE — Assessment & Plan Note (Signed)
 Well controlled. Plan to hold off spironolactone  in effort to consolidate medications.

## 2024-10-30 DIAGNOSIS — R2689 Other abnormalities of gait and mobility: Secondary | ICD-10-CM | POA: Diagnosis not present

## 2024-10-30 DIAGNOSIS — R2681 Unsteadiness on feet: Secondary | ICD-10-CM | POA: Diagnosis not present

## 2024-10-30 DIAGNOSIS — S72001D Fracture of unspecified part of neck of right femur, subsequent encounter for closed fracture with routine healing: Secondary | ICD-10-CM | POA: Diagnosis not present

## 2024-10-30 DIAGNOSIS — R296 Repeated falls: Secondary | ICD-10-CM | POA: Diagnosis not present

## 2024-11-02 DIAGNOSIS — R2681 Unsteadiness on feet: Secondary | ICD-10-CM | POA: Diagnosis not present

## 2024-11-02 DIAGNOSIS — S72001D Fracture of unspecified part of neck of right femur, subsequent encounter for closed fracture with routine healing: Secondary | ICD-10-CM | POA: Diagnosis not present

## 2024-11-02 DIAGNOSIS — R2689 Other abnormalities of gait and mobility: Secondary | ICD-10-CM | POA: Diagnosis not present

## 2024-11-02 DIAGNOSIS — R296 Repeated falls: Secondary | ICD-10-CM | POA: Diagnosis not present

## 2024-11-03 DIAGNOSIS — Z741 Need for assistance with personal care: Secondary | ICD-10-CM | POA: Diagnosis not present

## 2024-11-03 DIAGNOSIS — R296 Repeated falls: Secondary | ICD-10-CM | POA: Diagnosis not present

## 2024-11-03 DIAGNOSIS — S72001D Fracture of unspecified part of neck of right femur, subsequent encounter for closed fracture with routine healing: Secondary | ICD-10-CM | POA: Diagnosis not present

## 2024-11-03 DIAGNOSIS — R2689 Other abnormalities of gait and mobility: Secondary | ICD-10-CM | POA: Diagnosis not present

## 2024-11-03 DIAGNOSIS — M2681 Anterior soft tissue impingement: Secondary | ICD-10-CM | POA: Diagnosis not present

## 2024-11-04 ENCOUNTER — Telehealth: Payer: Self-pay | Admitting: Pediatrics

## 2024-11-04 DIAGNOSIS — H524 Presbyopia: Secondary | ICD-10-CM | POA: Diagnosis not present

## 2024-11-04 DIAGNOSIS — E119 Type 2 diabetes mellitus without complications: Secondary | ICD-10-CM | POA: Diagnosis not present

## 2024-11-04 LAB — OPHTHALMOLOGY REPORT-SCANNED

## 2024-11-04 NOTE — Telephone Encounter (Signed)
 Copied from CRM [id number]. Topic: General - Other >> Nov 04, 2024  4:22 PM Nathanel BROCKS wrote: Reason for CRM:  Wanted to see if we got request for diabetic shoes.

## 2024-11-04 NOTE — Telephone Encounter (Signed)
 See other phone encounter.

## 2024-11-04 NOTE — Telephone Encounter (Unsigned)
 Copied from CRM [id number]. Topic: Clinical - Order For Equipment >> Nov 04, 2024  1:13 PM Delon DASEN wrote: Reason for CRM: sent request for diabetic shoe order- Shanta with [employer]  [phone]

## 2024-11-05 DIAGNOSIS — M2681 Anterior soft tissue impingement: Secondary | ICD-10-CM | POA: Diagnosis not present

## 2024-11-05 DIAGNOSIS — R2689 Other abnormalities of gait and mobility: Secondary | ICD-10-CM | POA: Diagnosis not present

## 2024-11-05 DIAGNOSIS — R296 Repeated falls: Secondary | ICD-10-CM | POA: Diagnosis not present

## 2024-11-05 DIAGNOSIS — Z741 Need for assistance with personal care: Secondary | ICD-10-CM | POA: Diagnosis not present

## 2024-11-05 DIAGNOSIS — S72001D Fracture of unspecified part of neck of right femur, subsequent encounter for closed fracture with routine healing: Secondary | ICD-10-CM | POA: Diagnosis not present

## 2024-11-05 DIAGNOSIS — R2681 Unsteadiness on feet: Secondary | ICD-10-CM | POA: Diagnosis not present

## 2024-11-06 ENCOUNTER — Other Ambulatory Visit: Payer: Self-pay | Admitting: Pediatrics

## 2024-11-06 ENCOUNTER — Ambulatory Visit: Attending: Cardiology | Admitting: Cardiology

## 2024-11-06 ENCOUNTER — Ambulatory Visit: Admitting: Cardiology

## 2024-11-06 ENCOUNTER — Encounter: Payer: Self-pay | Admitting: Cardiology

## 2024-11-06 VITALS — BP 95/60 | HR 91 | Ht 66.0 in | Wt 163.2 lb

## 2024-11-06 DIAGNOSIS — I5020 Unspecified systolic (congestive) heart failure: Secondary | ICD-10-CM | POA: Diagnosis not present

## 2024-11-06 DIAGNOSIS — R2681 Unsteadiness on feet: Secondary | ICD-10-CM | POA: Diagnosis not present

## 2024-11-06 DIAGNOSIS — E1169 Type 2 diabetes mellitus with other specified complication: Secondary | ICD-10-CM | POA: Diagnosis not present

## 2024-11-06 DIAGNOSIS — E785 Hyperlipidemia, unspecified: Secondary | ICD-10-CM

## 2024-11-06 DIAGNOSIS — I361 Nonrheumatic tricuspid (valve) insufficiency: Secondary | ICD-10-CM | POA: Diagnosis not present

## 2024-11-06 DIAGNOSIS — I480 Paroxysmal atrial fibrillation: Secondary | ICD-10-CM | POA: Diagnosis not present

## 2024-11-06 DIAGNOSIS — R296 Repeated falls: Secondary | ICD-10-CM | POA: Diagnosis not present

## 2024-11-06 DIAGNOSIS — I959 Hypotension, unspecified: Secondary | ICD-10-CM | POA: Diagnosis not present

## 2024-11-06 DIAGNOSIS — R2689 Other abnormalities of gait and mobility: Secondary | ICD-10-CM | POA: Diagnosis not present

## 2024-11-06 DIAGNOSIS — E1142 Type 2 diabetes mellitus with diabetic polyneuropathy: Secondary | ICD-10-CM

## 2024-11-06 DIAGNOSIS — I340 Nonrheumatic mitral (valve) insufficiency: Secondary | ICD-10-CM | POA: Diagnosis not present

## 2024-11-06 DIAGNOSIS — I2510 Atherosclerotic heart disease of native coronary artery without angina pectoris: Secondary | ICD-10-CM | POA: Diagnosis not present

## 2024-11-06 DIAGNOSIS — I071 Rheumatic tricuspid insufficiency: Secondary | ICD-10-CM

## 2024-11-06 DIAGNOSIS — S72001D Fracture of unspecified part of neck of right femur, subsequent encounter for closed fracture with routine healing: Secondary | ICD-10-CM | POA: Diagnosis not present

## 2024-11-06 MED ORDER — CLOPIDOGREL BISULFATE 75 MG PO TABS: 75.0000 mg | ORAL_TABLET | Freq: Every day | ORAL | 3 refills | Status: DC

## 2024-11-06 NOTE — Telephone Encounter (Signed)
 Copied from CRM [id number]. Topic: Clinical - Medication Refill >> Nov 06, 2024 12:37 PM Rachelle R wrote: Medication: metoprolol  tartrate (LOPRESSOR ) 50 MG tablet famotidine  (PEPCID ) 20 MG tablet midodrine  (PROAMATINE ) 5 MG tablet  Has the patient contacted their pharmacy? Yes, call Dr  This is the patient's preferred pharmacy:  [facility] - [place], [place] - [address] [place] [address] Phone: [phone] Fax: [phone]  Is this the correct pharmacy for this prescription? Yes If no, delete pharmacy and type the correct one.   Has the prescription been filled recently? No  Is the patient out of the medication? Yes  Has the patient been seen for an appointment in the last year OR does the patient have an upcoming appointment? Yes  Can we respond through MyChart? Yes  Agent: Please be advised that Rx refills may take up to 3 business days. We ask that you follow-up with your pharmacy.

## 2024-11-06 NOTE — Progress Notes (Signed)
 Cardiology Office Note   Date:  11/09/2024  ID:  Alison Richardson, DOB 12-Oct-1933, MRN [id number] PCP: Alison Hadassah SQUIBB, MD  [facility] Providers Cardiologist:  Alison Lunger, MD     History of Present Illness Alison Richardson is a 89 y.o. female with past medical history of coronary artery disease being medically managed, persistent atrial fibrillation, HFpEF, valvular heart disease, type 2 diabetes, hypertension, hyperlipidemia, and hypothyroidism, who presents today for follow-up.   She previously been followed by [facility] cardiology.  Recommend imaging outlined below.  She underwent left heart catheterization in 09/2021 which showed ostial proximal RCA stenosis 99%, distal RCA stenosis 65%, RPDA stenosis 60%, D1 stenosis 90%, ostial left circumflex to proximal left circumflex 25% stenosis, and ostial LAD to proximal LAD 25% stenosis.  LV systolic function estimated at 35-45%.  Given complex lesions that were not ideal for PCI, medical management was recommended.  Echocardiogram in 10/2021 showed an EF of 45-50%, no RWMA, normal LV diastolic function, normal RV systolic function and size, mild mitral regurgitation.  Follow-up echoes have demonstrated normalization of LV systolic function.  She establish care with Dr. Gollan in 11/2023.  Echocardiogram in 12/2023 showed an EF of 60 to 65%, no RWMA, normal RV systolic function and size, mildly elevated PASP.  Mildly to moderately mitral regurgitation and mild to moderate tricuspid regurgitation.  She was admitted to [facility] 03/2024 with progressive dyspnea treated for acute hypoxic respiratory failure with sepsis secondary to pneumonia.  Was evaluated at her PCPs office on 05/05/2024 for routine follow-up.  Blood pressure was soft at 93/57.  Valsartan  was discontinued.  She was started on Ozempic .  She had recently been admitted from 5/16 - 05/09/2024 with continued lightheadedness and dizziness with positional changes.  Blood pressure 101/59.   High-sensitivity troponin negative x 2.  Chest x-ray with bibasilar linear opacities left greater than right felt to favor atelectasis with stable cardiomegaly.  She received IV fluids with recommendation to discontinue diltiazem .  Uptitrating home blood pressure metoprolol  was reinitiated.  Evaluated by her PCP 05/12/2024 with reported episode of chest discomfort.  Blood pressure improved and was 123/73.  She did feel like abdominal was distended and bloated.  She was advised to take a lower dose of Toprol -XL 12.5 mg daily and torsemide  for weight gain.  She was continued on Ozempic .  She was admitted to [facility] on 05/13/2024 with an illness and onset of shortness of breath when lying down.  On arrival EMS had given Solu-Medrol  DuoNebs as she was found to be hypoxic with oxygen saturations of 87% on 4 L.  She was transitioned from nasal cannula to BiPAP.  High-sensitivity troponin 14 with a delta troponin of 108.  D-dimer 1.2, lactic acid 2.8 trending to 3.1, BNP of 1890, hemoglobin 12.3, WBCs of 24.4.  COVID, influenza, and RSV were negative.  Chest x-ray with interval development of interstitial pulmonary edema versus stable cardiomegaly.  She was given nitro and subsequently transition to nitro drip as well as IV Lasix  40 mg, and heparin  infusion.  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  Ultimately amiodarone  was discontinued.  She underwent PCI on 5/27 with 2 DES placed to the RCA.  Started on aspirin , clopidogrel , and apixaban  with plans to discontinue aspirin  after 1 week.  She was discharged home with home health on 05/20/2024.  She was evaluated by advanced heart failure 06/04/2024 with a chief complaint of fatigue.  Associated shortness of breath, cough, runny nose, back pain.  She had had side effects with SGLT2 inhibitors in the past but could not remember the side effect.  There was no escalation of GDMT but she was sent for labs.  With low blood pressures and  orthostatic symptoms she would likely not be able to tolerate MRA or beta-blocker therapy.  She was seen in clinic 06/01/2024 stating overall she was doing well from after being discharged from the hospital.  She continued to suffer from chronic shortness of breath but states it was a little worse prior to her hospitalization.  She had not missed any doses of her apixaban  and had to drop her aspirin  therapy 7 days after hospitalization.  Recent hospitalization from 7/12 - 07/14/2024 for closed left hip fracture after mechanical fall which required ORIF on 07/06/2024.  She was evaluated by her team during recent hospitalization and cleared for surgery.  She did have atrial fibrillation with RVR was continued on her apixaban  and metoprolol  and amiodarone  was discontinued on 7/17 due to prolonged QTc.  Manage and had to be initiated on 7/16.  Rate control has been challenging due to intolerance of amiodarone  with QT prolongation.  Digoxin  had been discontinued.  She eventually ended up on metoprolol  50 mg twice daily and apixaban  with the dose of midodrine  being decreased.  She was to be discharged with clopidogrel  and Eliquis  on DC.  She did receive 1 unit of packed RBCs during hospitalization with a hemoglobin of 9.9 postoperatively.  Was also started on oral iron  supplements.  She was considered stable for discharge and was discharged to [facility] 07/14/24.   She was last seen in clinic 08/05/2024 accompanied by her son.  She states that she had been doing well.  She was working with therapy and her hip had improved.  She continued on her current medication regimen without any undue side effects.  Her son was concerned that the facility noted she was in atrial fibrillation and noticed heart rates up to the 130s.  She was continued on metoprolol  50 mg twice daily as well as an additional 25 as needed for elevated heart rates with her EKG at her visit revealing rate controlled atrial flutter.   She returns to clinic today  stating that overall she has been doing well from a cardiac perspective.  She denies any chest pain or shortness of breath.  Continues to be in a wheelchair after having her left hip fracture and had ORIF done in July.  States that she has been compliant with her current medication regimen.  Denies any undue side effects.  Denies any recent hospitalizations or visits to the emergency department.  ROS: 10 point review of systems has been reviewed and considered negative the exception was been listed in the HPI  Studies Reviewed      2D limited echo 06/18/24 1. Left ventricular ejection fraction, by estimation, is 60 to 65%. The  left ventricle has normal function. The left ventricle has no regional  wall motion abnormalities. Left ventricular diastolic parameters were  normal.   2. Right ventricular systolic function is normal. The right ventricular  size is normal. There is mildly elevated pulmonary artery systolic  pressure. The estimated right ventricular systolic pressure is 43.2 mmHg.   3. The mitral valve is normal in structure. Mild to moderate mitral valve  regurgitation. No evidence of mitral stenosis.   4. Tricuspid valve regurgitation is moderate.   5. The aortic valve is tricuspid. Aortic valve regurgitation is not  visualized. No aortic stenosis  is present.   6. The inferior vena cava is normal in size with greater than 50%  respiratory variability, suggesting right atrial pressure of 3 mmHg.    LHC 05/19/2024 Conclusions: Relatively stable appearance of severe two-vessel CAD, including chronic total occlusion of D1 branch and sequential 99% proximal, 40-50% mid, and 70-80% distal RCA stenoses.  There are also moderate, nonobstructive lesions in the ostial/proximal LAD, proximal/mid LCx, and RPDA/RPAV branches. Normal left and right heart filling pressures (mean RA 4, RV 40/5, PCWP 15, LVEDP 15 mmHg).  Prominent V waves noted on PCWP tracing suggest significant mitral  regurgitation. Mild pulmonary hypertension (PA 36/15, mean 22 mmHg). Normal Fick cardiac output/index (CO 4.9 L/min, CI 2.7 L/min/m). Successful PCI to proximal and distal RCA stenoses using nonoverlapping Onyx Frontier 3.0 x 15 mm (proximal) and 2.75 x 15 mm (distal) drug-eluting stents with 0% residual stenosis and TIMI-3 flow.   Recommendations: If no evidence of bleeding or vascular injury, resume apixaban  5 mg twice daily this evening.  Anticipate therapy with apixaban , clopidogrel , and aspirin  for 1 week, after which time aspirin  can be stopped.  Continue apixaban  and clopidogrel  therapy for up to 12 months, as tolerated (could switch clopidogrel  to aspirin  at 6 months if there is concern for high bleeding risk). Maintain net even fluid balance. Escalate goal-directed medical therapy as tolerated. Aggressive secondary prevention of coronary artery disease.   2D echo 05/14/2024 1. Left ventricular ejection fraction, by estimation, is 40 to 45%. The  left ventricle has mild to moderately decreased function. The left  ventricle demonstrates global hypokinesis. Left ventricular diastolic  parameters are indeterminate.   2. Right ventricular systolic function is normal. The right ventricular  size is moderately enlarged. There is moderately elevated pulmonary artery  systolic pressure. The estimated right ventricular systolic pressure is  54.3 mmHg.   3. The mitral valve is normal in structure. Mild mitral valve  regurgitation.   4. Tricuspid valve regurgitation is mild to moderate.   5. The aortic valve is tricuspid. Aortic valve regurgitation is not  visualized.   6. The inferior vena cava is normal in size with greater than 50%  respiratory variability, suggesting right atrial pressure of 3 mmHg.    2D echo 12/31/2023: 1. Left ventricular ejection fraction, by estimation, is 60 to 65%. The  left ventricle has normal function. The left ventricle has no regional  wall motion  abnormalities. Left ventricular diastolic parameters are  indeterminate.   2. Right ventricular systolic function is normal. The right ventricular  size is normal. There is mildly elevated pulmonary artery systolic  pressure. The estimated right ventricular systolic pressure is 40.8 mmHg.   3. Left atrial size was mild to moderately dilated.   4. The mitral valve is normal in structure. Mild to moderate mitral valve  regurgitation. No evidence of mitral stenosis.   5. Tricuspid valve regurgitation is mild to moderate.   6. The aortic valve has an indeterminant number of cusps. Aortic valve  regurgitation is not visualized. No aortic stenosis is present.   7. The inferior vena cava is normal in size with greater than 50%  respiratory variability, suggesting right atrial pressure of 3 mmHg.    2D echo 04/21/2023: 1. Left ventricular ejection fraction, by estimation, is 55 to 60%. Left  ventricular ejection fraction by PLAX is 67 %. The left ventricle has  normal function. The left ventricle has no regional wall motion  abnormalities. Left ventricular diastolic  parameters are indeterminate.  2. Right ventricular systolic function is normal. The right ventricular  size is normal. There is mildly elevated pulmonary artery systolic  pressure. The estimated right ventricular systolic pressure is 38.3 mmHg.   3. The mitral valve is normal in structure. Moderate mitral valve  regurgitation. No evidence of mitral stenosis.   4. Tricuspid valve regurgitation is moderate to severe.   5. The aortic valve is tricuspid. Aortic valve regurgitation is not  visualized. No aortic stenosis is present.   6. The inferior vena cava is normal in size with greater than 50%  respiratory variability, suggesting right atrial pressure of 3 mmHg.    2D echo 12/11/2022: 1. Left ventricular ejection fraction, by estimation, is 50 to 55%. The  left ventricle has low normal function. The left ventricle has no  regional  wall motion abnormalities. Left ventricular diastolic parameters were  normal.   2. Right ventricular systolic function is normal. The right ventricular  size is normal.   3. The mitral valve is normal in structure. Mild mitral valve  regurgitation. No evidence of mitral stenosis.   4. The aortic valve is normal in structure. Aortic valve regurgitation is  not visualized. No aortic stenosis is present.   5. The inferior vena cava is normal in size with greater than 50%  respiratory variability, suggesting right atrial pressure of 3 mmHg.    2D echo 10/31/2021: 1. Left ventricular ejection fraction, by estimation, is 45 to 50%. The  left ventricle has mildly decreased function. The left ventricle has no  regional wall motion abnormalities. Left ventricular diastolic parameters  were normal.   2. Right ventricular systolic function is normal. The right ventricular  size is normal.   3. The mitral valve is normal in structure. Mild mitral valve  regurgitation. No evidence of mitral stenosis.   4. The aortic valve is normal in structure. Aortic valve regurgitation is  not visualized. No aortic stenosis is present.   5. The inferior vena cava is normal in size with greater than 50%  respiratory variability, suggesting right atrial pressure of 3 mmHg.    LHC 10/12/2021:   Ost RCA to Prox RCA lesion is 99% stenosed.   Dist RCA lesion is 65% stenosed.   RPDA lesion is 60% stenosed.   1st Diag lesion is 99% stenosed.   Ost Cx to Prox Cx lesion is 25% stenosed.   Ost LAD to Prox LAD lesion is 25% stenosed.   There is mild left ventricular systolic dysfunction.   LV end diastolic pressure is mildly elevated.   The left ventricular ejection fraction is 35-45% by visual estimate.   89 year old female with known chronic nonvalvular atrial fibrillation hypertension hyperlipidemia with acute non-ST elevation myocardial infarction   Cardiac catheterization showing mild inferior  hypokinesis and apical hypokinesis with ejection fraction of 40 to 45%   Severe and/or critical stenosis of ostial right coronary artery and moderate atherosclerosis of distal right coronary artery and PDA complex in nature with good collaterals from left anterior descending artery Mild atherosclerosis of left anterior descending artery and circumflex artery   After discussion with cardiovascular team would best be medically manage at this time due to complex lesions and not ideal for PCI and stent placement   Plan Isosorbide  for better collateral flow 2.  Increase beta-blocker for better heart rate control of atrial fibrillation with a goal heart rate between 60 and 90 bpm 3.  Reinstate anticoagulation for further risk reduction and stroke with atrial fibrillation 4.  Single antiplatelet therapy with Plavix  5.  Hypertension control and high intensity cholesterol therapy 6.  Cardiac rehabilitation   2D echo 10/07/2020: 1. Left ventricular ejection fraction, by estimation, is 60 to 65%. The  left ventricle has normal function. The left ventricle has no regional  wall motion abnormalities. Left ventricular diastolic parameters are  consistent with Grade I diastolic  dysfunction (impaired relaxation).   2. Right ventricular systolic function is normal. The right ventricular  size is normal.   3. The mitral valve is grossly normal. Mild mitral valve regurgitation.   4. The aortic valve is normal in structure. Aortic valve regurgitation is  trivial.    2D echo 09/29/2019: 1. Left ventricular ejection fraction, by visual estimation, is 60 to  65%. The left ventricle has normal function. Left ventricular septal wall  thickness was mildly increased. Mildly increased left ventricular  posterior wall thickness. There is mildly  increased left ventricular hypertrophy.   2. Global right ventricle has normal systolic function.The right  ventricular size is mildly enlarged. No increase in right  ventricular wall  thickness.   3. Left atrial size was mildly dilated.   4. Right atrial size was normal.   5. The mitral valve is grossly normal. Mild mitral valve regurgitation.   6. The tricuspid valve is grossly normal. Tricuspid valve regurgitation  is mild.   7. The aortic valve is tricuspid Aortic valve regurgitation was not  visualized by color flow Doppler.   8. The pulmonic valve was not well visualized. Pulmonic valve  regurgitation is trivial by color flow Doppler.   9. Moderately elevated pulmonary artery systolic pressure.  10. No shunts by color flow doppler.   Risk Assessment/Calculations  CHA2DS2-VASc Score = 7   This indicates a 11.2% annual risk of stroke. The patient's score is based upon: CHF History: 1 HTN History: 1 Diabetes History: 1 Stroke History: 0 Vascular Disease History: 1 Age Score: 2 Gender Score: 1        Physical Exam VS:  BP 95/60 (BP Location: Left Arm, Patient Position: Sitting, Cuff Size: Normal)   Pulse 91   Ht 5' 6 (1.676 m)   Wt 163 lb 3.2 oz (74 kg)   SpO2 98%   BMI 26.34 kg/m        Wt Readings from Last 3 Encounters:  11/06/24 163 lb 3.2 oz (74 kg)  10/29/24 160 lb (72.6 kg)  10/19/24 158 lb (71.7 kg)    GEN: Well nourished, well developed in no acute distress NECK: No JVD; No carotid bruits CARDIAC: IR IR, no murmurs, rubs, gallops RESPIRATORY:  Clear to auscultation without rales, wheezing or rhonchi  ABDOMEN: Soft, non-tender, non-distended EXTREMITIES: Trace pretibial edema; No deformity   ASSESSMENT AND PLAN Coronary artery disease mild the native coronary arteries without angina.  Prior cardiac catheterization revealed stable coronary anatomy with CTO of small diagonal branch with severe proximal distal RCA and antegrade collateral flow.  Successful PCI/DES of the proximal distal RCA.  She is continued on clopidogrel  75 mg daily and apixaban  5 mg twice daily.  She denies any bleeding with no blood noted in her  urine or stool.  She denies any anginal or anginal equivalents.  No further ischemic evaluation is needed at this time.  Chronic HFimpEF with last echocardiogram revealed an LVEF of 60 to 65%.  She is continued on metoprolol  50 mg twice daily.  Losartan  and spironolactone  had to be discontinued due to hypotension.  She appears to be euvolemic  on exam today and continues to suffer from OHI class I-II symptoms on occasion.  With continued low blood pressure on midodrine  GDMT is unable to be escalated at this time.  Paroxysmal atrial fibrillation/atrial flutter with varying AV block.  She is continued on apixaban  5 mg twice daily for CHA2DS2-VASc of at least 7 for stroke prophylaxis.  Previously was on amiodarone  but was discontinued for possible lung toxicity and QT prolongation.  She is continued on metoprolol  50 mg twice daily as well as additional 25 mg as needed for elevated heart rates of greater than 120 bpm.  Valvular heart disease with her previous echocardiogram revealing mitral anchor Trosper regurgitation was unchanged.  Will continue to follow with surveillance studies.  She continues to remain asymptomatic.  Hypotension with a history of hypertension off all antihypertensive medications with exception of metoprolol .  She is continued on midodrine  5 mg 3 times daily.  Blood pressure today is 95/60.  She has been encouraged to continue to monitor pressure 1 to 2 hours postmedication administration at home as well.  Type 2 diabetes with ongoing management by her PCP.  Mechanical fall status post right hip fracture status post ORIF.  She had previously been at [facility] undergoing therapy.       Dispo: Patient to return to clinic to see MD/APP in 6 months or sooner if needed for further evaluation  Signed, Dontravious Camille, NP

## 2024-11-06 NOTE — Telephone Encounter (Signed)
 Paperwork has been completed and faxed back to [facility] on 11/06/2024.

## 2024-11-06 NOTE — Patient Instructions (Signed)
 Medication Instructions:  Your physician recommends the following medication changes.  START TAKING: Plavix  75 mg daily  *If you need a refill on your cardiac medications before your next appointment, please call your pharmacy*  Lab Work: No labs ordered today  If you have labs (blood work) drawn today and your tests are completely normal, you will receive your results only by: MyChart Message (if you have MyChart) OR A paper copy in the mail If you have any lab test that is abnormal or we need to change your treatment, we will call you to review the results.  Testing/Procedures: No test ordered today   Follow-Up: At [facility], you and your health needs are our priority.  As part of our continuing mission to provide you with exceptional heart care, our providers are all part of one team.  This team includes your primary Cardiologist (physician) and Advanced Practice Providers or APPs (Physician Assistants and Nurse Practitioners) who all work together to provide you with the care you need, when you need it.  Your next appointment:   6 month(s)  Provider:   You may see Timothy Gollan, MD or one of the following Advanced Practice Providers on your designated Care Team:   Tylene Lunch, NP

## 2024-11-09 ENCOUNTER — Encounter: Payer: Self-pay | Admitting: Cardiology

## 2024-11-09 DIAGNOSIS — S72001D Fracture of unspecified part of neck of right femur, subsequent encounter for closed fracture with routine healing: Secondary | ICD-10-CM | POA: Diagnosis not present

## 2024-11-09 DIAGNOSIS — R2689 Other abnormalities of gait and mobility: Secondary | ICD-10-CM | POA: Diagnosis not present

## 2024-11-09 DIAGNOSIS — R296 Repeated falls: Secondary | ICD-10-CM | POA: Diagnosis not present

## 2024-11-09 DIAGNOSIS — R2681 Unsteadiness on feet: Secondary | ICD-10-CM | POA: Diagnosis not present

## 2024-11-09 NOTE — Telephone Encounter (Signed)
 Requested medication (s) are due for refill today: yes  Requested medication (s) are on the active medication list: yes  Last refill:  07/14/24  Future visit scheduled: yes  Notes to clinic:  Unable to refill per protocol, last refill by another/ ED provider.      Requested Prescriptions  Pending Prescriptions Disp Refills   metoprolol  tartrate (LOPRESSOR ) 50 MG tablet      Sig: Take 1 tablet (50 mg total) by mouth 2 (two) times daily.     Cardiovascular:  Beta Blockers Passed - 11/09/2024 12:15 PM      Passed - Last BP in normal range    BP Readings from Last 1 Encounters:  11/06/24 95/60         Passed - Last Heart Rate in normal range    Pulse Readings from Last 1 Encounters:  11/06/24 91         Passed - Valid encounter within last 6 months    Recent Outpatient Visits           1 week ago Type 2 diabetes mellitus with peripheral neuropathy [facility])   [place] [facility] Herold Hadassah SQUIBB, MD   2 weeks ago Type 2 diabetes mellitus with peripheral neuropathy [facility])   [facility]old Hadassah SQUIBB, MD   3 weeks ago Type 2 diabetes mellitus with peripheral neuropathy (HCC)   [place] [facility] [place], [place] T, NP   5 months ago Chronic diastolic CHF (congestive heart failure) [facility])   [place] [facility] Herold Hadassah SQUIBB, MD   6 months ago Chest discomfort   [place] [facility] Herold Hadassah SQUIBB, MD       Future Appointments             In 6 months Hammock, Tylene, NP [place] [facility]             midodrine  (PROAMATINE ) 5 MG tablet      Sig: Take 1 tablet (5 mg total) by mouth 3 (three) times daily with meals. Do NOT hold for dialysis. GIVE DOSE DURING DAYLIGHT HOURS ONLY     Not Delegated - Cardiovascular: Midodrine  Failed - 11/09/2024 12:15 PM      Failed - This refill cannot be delegated      Passed - Cr in normal range and within 360 days     Creatinine, Ser  Date Value Ref Range Status  10/13/2024 0.69 0.57 - 1.00 mg/dL Final         Passed - ALT in normal range and within 360 days    ALT  Date Value Ref Range Status  10/13/2024 11 0 - 32 IU/L Final         Passed - AST in normal range and within 360 days    AST  Date Value Ref Range Status  10/13/2024 16 0 - 40 IU/L Final         Passed - Last BP in normal range    BP Readings from Last 1 Encounters:  11/06/24 95/60         Passed - Valid encounter within last 12 months    Recent Outpatient Visits           1 week ago Type 2 diabetes mellitus with peripheral neuropathy [facility])   [place] [facility] Herold Hadassah SQUIBB, MD   2 weeks ago Type 2 diabetes mellitus with peripheral neuropathy (HCC)  [place] [facility] Herold Hadassah SQUIBB, MD   3 weeks ago Type 2 diabetes mellitus with peripheral neuropathy (HCC)   [place] [facility] [place], [place] T, NP   5 months ago Chronic diastolic CHF (congestive heart failure) [facility])   [place] [facility] Herold Hadassah SQUIBB, MD   6 months ago Chest discomfort   [place] [facility] Herold Hadassah SQUIBB, MD       Future Appointments             In 6 months Hammock, Tylene, NP [place] [facility]             famotidine  (PEPCID ) 20 MG tablet 60 tablet 0    Sig: Take 1 tablet (20 mg total) by mouth 2 (two) times daily before a meal.     Gastroenterology:  H2 Antagonists Passed - 11/09/2024 12:15 PM      Passed - Valid encounter within last 12 months    Recent Outpatient Visits           1 week ago Type 2 diabetes mellitus with peripheral neuropathy [facility])   [place] [facility] Herold Hadassah SQUIBB, MD   2 weeks ago Type 2 diabetes mellitus with peripheral neuropathy [facility])   [place] [facility] Herold Hadassah SQUIBB, MD   3 weeks ago Type 2 diabetes mellitus with peripheral neuropathy (HCC)   [place]  [facility] [place], [place] T, NP   5 months ago Chronic diastolic CHF (congestive heart failure) [facility])   [place] [facility] Herold Hadassah SQUIBB, MD   6 months ago Chest discomfort   [place] [facility] Herold Hadassah SQUIBB, MD       Future Appointments             In 6 months Hammock, Tylene, NP [place] [facility]

## 2024-11-10 DIAGNOSIS — R2689 Other abnormalities of gait and mobility: Secondary | ICD-10-CM | POA: Diagnosis not present

## 2024-11-10 DIAGNOSIS — S82034D Nondisplaced transverse fracture of right patella, subsequent encounter for closed fracture with routine healing: Secondary | ICD-10-CM | POA: Diagnosis not present

## 2024-11-10 DIAGNOSIS — M2681 Anterior soft tissue impingement: Secondary | ICD-10-CM | POA: Diagnosis not present

## 2024-11-10 DIAGNOSIS — R296 Repeated falls: Secondary | ICD-10-CM | POA: Diagnosis not present

## 2024-11-10 DIAGNOSIS — Z741 Need for assistance with personal care: Secondary | ICD-10-CM | POA: Diagnosis not present

## 2024-11-10 DIAGNOSIS — S72001D Fracture of unspecified part of neck of right femur, subsequent encounter for closed fracture with routine healing: Secondary | ICD-10-CM | POA: Diagnosis not present

## 2024-11-11 DIAGNOSIS — R2689 Other abnormalities of gait and mobility: Secondary | ICD-10-CM | POA: Diagnosis not present

## 2024-11-11 DIAGNOSIS — R2681 Unsteadiness on feet: Secondary | ICD-10-CM | POA: Diagnosis not present

## 2024-11-11 DIAGNOSIS — S72001D Fracture of unspecified part of neck of right femur, subsequent encounter for closed fracture with routine healing: Secondary | ICD-10-CM | POA: Diagnosis not present

## 2024-11-11 DIAGNOSIS — R296 Repeated falls: Secondary | ICD-10-CM | POA: Diagnosis not present

## 2024-11-11 MED ORDER — MIDODRINE HCL 5 MG PO TABS: 5.0000 mg | ORAL_TABLET | Freq: Three times a day (TID) | ORAL | 1 refills | Status: DC

## 2024-11-11 MED ORDER — FAMOTIDINE 20 MG PO TABS: 20.0000 mg | ORAL_TABLET | Freq: Two times a day (BID) | ORAL | 0 refills | Status: DC

## 2024-11-11 MED ORDER — METOPROLOL TARTRATE 50 MG PO TABS: 50.0000 mg | ORAL_TABLET | Freq: Two times a day (BID) | ORAL | 3 refills | Status: DC

## 2024-11-12 ENCOUNTER — Ambulatory Visit: Admitting: Pediatrics

## 2024-11-13 DIAGNOSIS — S72001D Fracture of unspecified part of neck of right femur, subsequent encounter for closed fracture with routine healing: Secondary | ICD-10-CM | POA: Diagnosis not present

## 2024-11-13 DIAGNOSIS — R296 Repeated falls: Secondary | ICD-10-CM | POA: Diagnosis not present

## 2024-11-13 DIAGNOSIS — R2689 Other abnormalities of gait and mobility: Secondary | ICD-10-CM | POA: Diagnosis not present

## 2024-11-13 DIAGNOSIS — R2681 Unsteadiness on feet: Secondary | ICD-10-CM | POA: Diagnosis not present

## 2024-11-15 DIAGNOSIS — R2681 Unsteadiness on feet: Secondary | ICD-10-CM | POA: Diagnosis not present

## 2024-11-15 DIAGNOSIS — S72001D Fracture of unspecified part of neck of right femur, subsequent encounter for closed fracture with routine healing: Secondary | ICD-10-CM | POA: Diagnosis not present

## 2024-11-15 DIAGNOSIS — R296 Repeated falls: Secondary | ICD-10-CM | POA: Diagnosis not present

## 2024-11-15 DIAGNOSIS — R2689 Other abnormalities of gait and mobility: Secondary | ICD-10-CM | POA: Diagnosis not present

## 2024-11-17 DIAGNOSIS — R296 Repeated falls: Secondary | ICD-10-CM | POA: Diagnosis not present

## 2024-11-17 DIAGNOSIS — R2681 Unsteadiness on feet: Secondary | ICD-10-CM | POA: Diagnosis not present

## 2024-11-17 DIAGNOSIS — M2681 Anterior soft tissue impingement: Secondary | ICD-10-CM | POA: Diagnosis not present

## 2024-11-17 DIAGNOSIS — S72001D Fracture of unspecified part of neck of right femur, subsequent encounter for closed fracture with routine healing: Secondary | ICD-10-CM | POA: Diagnosis not present

## 2024-11-17 DIAGNOSIS — R2689 Other abnormalities of gait and mobility: Secondary | ICD-10-CM | POA: Diagnosis not present

## 2024-11-17 DIAGNOSIS — Z741 Need for assistance with personal care: Secondary | ICD-10-CM | POA: Diagnosis not present

## 2024-11-23 DIAGNOSIS — R2681 Unsteadiness on feet: Secondary | ICD-10-CM | POA: Diagnosis not present

## 2024-11-23 DIAGNOSIS — R296 Repeated falls: Secondary | ICD-10-CM | POA: Diagnosis not present

## 2024-11-23 DIAGNOSIS — S72001D Fracture of unspecified part of neck of right femur, subsequent encounter for closed fracture with routine healing: Secondary | ICD-10-CM | POA: Diagnosis not present

## 2024-11-23 DIAGNOSIS — R2689 Other abnormalities of gait and mobility: Secondary | ICD-10-CM | POA: Diagnosis not present

## 2024-11-24 DIAGNOSIS — R2689 Other abnormalities of gait and mobility: Secondary | ICD-10-CM | POA: Diagnosis not present

## 2024-11-24 DIAGNOSIS — R296 Repeated falls: Secondary | ICD-10-CM | POA: Diagnosis not present

## 2024-11-24 DIAGNOSIS — S72001D Fracture of unspecified part of neck of right femur, subsequent encounter for closed fracture with routine healing: Secondary | ICD-10-CM | POA: Diagnosis not present

## 2024-11-24 DIAGNOSIS — M2681 Anterior soft tissue impingement: Secondary | ICD-10-CM | POA: Diagnosis not present

## 2024-11-24 DIAGNOSIS — Z741 Need for assistance with personal care: Secondary | ICD-10-CM | POA: Diagnosis not present

## 2024-11-25 DIAGNOSIS — R2681 Unsteadiness on feet: Secondary | ICD-10-CM | POA: Diagnosis not present

## 2024-11-25 DIAGNOSIS — S72001D Fracture of unspecified part of neck of right femur, subsequent encounter for closed fracture with routine healing: Secondary | ICD-10-CM | POA: Diagnosis not present

## 2024-11-25 DIAGNOSIS — R296 Repeated falls: Secondary | ICD-10-CM | POA: Diagnosis not present

## 2024-11-25 DIAGNOSIS — R2689 Other abnormalities of gait and mobility: Secondary | ICD-10-CM | POA: Diagnosis not present

## 2024-11-26 DIAGNOSIS — S72001D Fracture of unspecified part of neck of right femur, subsequent encounter for closed fracture with routine healing: Secondary | ICD-10-CM | POA: Diagnosis not present

## 2024-11-26 DIAGNOSIS — R2689 Other abnormalities of gait and mobility: Secondary | ICD-10-CM | POA: Diagnosis not present

## 2024-11-26 DIAGNOSIS — Z741 Need for assistance with personal care: Secondary | ICD-10-CM | POA: Diagnosis not present

## 2024-11-26 DIAGNOSIS — R296 Repeated falls: Secondary | ICD-10-CM | POA: Diagnosis not present

## 2024-11-26 DIAGNOSIS — M2681 Anterior soft tissue impingement: Secondary | ICD-10-CM | POA: Diagnosis not present

## 2024-11-27 DIAGNOSIS — R296 Repeated falls: Secondary | ICD-10-CM | POA: Diagnosis not present

## 2024-11-27 DIAGNOSIS — S72001D Fracture of unspecified part of neck of right femur, subsequent encounter for closed fracture with routine healing: Secondary | ICD-10-CM | POA: Diagnosis not present

## 2024-11-27 DIAGNOSIS — R2681 Unsteadiness on feet: Secondary | ICD-10-CM | POA: Diagnosis not present

## 2024-11-27 DIAGNOSIS — R2689 Other abnormalities of gait and mobility: Secondary | ICD-10-CM | POA: Diagnosis not present

## 2024-12-01 ENCOUNTER — Other Ambulatory Visit: Payer: Self-pay | Admitting: Pediatrics

## 2024-12-01 ENCOUNTER — Other Ambulatory Visit: Payer: Self-pay | Admitting: Podiatry

## 2024-12-03 NOTE — Telephone Encounter (Signed)
 Requested medication (s) are due for refill today: yes  Requested medication (s) are on the active medication list: yes  Last refill:  11/11/24  Future visit scheduled: yes  Notes to clinic:  Unable to refill per protocol, cannot delegate.      Requested Prescriptions  Pending Prescriptions Disp Refills   midodrine  (PROAMATINE ) 5 MG tablet [Pharmacy Med Name: MIDODRINE  HCL 5 MG TAB] 90 tablet 1    Sig: TAKE 1 TABLET BY MOUTH THREE TIMES DAILYWITH MEALS (DO NOT HOLD FOR DIALYSIS,GIVE DOSE DURING DAYLIGHT HOURSONLY)     Not Delegated - Cardiovascular: Midodrine  Failed - 12/03/2024 12:14 PM      Failed - This refill cannot be delegated      Passed - Cr in normal range and within 360 days    Creatinine, Ser  Date Value Ref Range Status  10/13/2024 0.69 0.57 - 1.00 mg/dL Final         Passed - ALT in normal range and within 360 days    ALT  Date Value Ref Range Status  10/13/2024 11 0 - 32 IU/L Final         Passed - AST in normal range and within 360 days    AST  Date Value Ref Range Status  10/13/2024 16 0 - 40 IU/L Final         Passed - Last BP in normal range    BP Readings from Last 1 Encounters:  11/06/24 95/60         Passed - Valid encounter within last 12 months    Recent Outpatient Visits           1 month ago Type 2 diabetes mellitus with peripheral neuropathy (HCC)   [place] [facility] Herold Hadassah SQUIBB, MD   1 month ago Type 2 diabetes mellitus with peripheral neuropathy [facility])   [place] [facility] Herold Hadassah SQUIBB, MD   1 month ago Type 2 diabetes mellitus with peripheral neuropathy (HCC)   [place] [facility] [place], Henning T, NP   6 months ago Chronic diastolic CHF (congestive heart failure) [facility])   [place] [facility] Herold Hadassah SQUIBB, MD   6 months ago Chest discomfort   [place] [facility] Herold Hadassah SQUIBB, MD       Future Appointments             In 5  months Hammock, Tylene, NP [place] [facility]

## 2024-12-03 NOTE — Telephone Encounter (Signed)
 Requested Prescriptions  Pending Prescriptions Disp Refills   levothyroxine  (SYNTHROID ) 88 MCG tablet [Pharmacy Med Name: LEVOTHYROXINE  SODIUM 88 MCG TAB] 90 tablet 3    Sig: TAKE 1 TABLET EVERY DAY ON EMPTY STOMACHWITH A GLASS OF WATER AT LEAST 30-60 MINBEFORE BREAKFAST     Endocrinology:  Hypothyroid Agents Passed - 12/03/2024 12:08 PM      Passed - TSH in normal range and within 360 days    TSH  Date Value Ref Range Status  10/13/2024 2.060 0.450 - 4.500 uIU/mL Final         Passed - Valid encounter within last 12 months    Recent Outpatient Visits           1 month ago Type 2 diabetes mellitus with peripheral neuropathy [facility])   [place] [facility] Herold Hadassah SQUIBB, MD   1 month ago Type 2 diabetes mellitus with peripheral neuropathy [facility])   [place] [facility] Herold Hadassah SQUIBB, MD   1 month ago Type 2 diabetes mellitus with peripheral neuropathy (HCC)   [place] [facility] [place], [place] T, NP   6 months ago Chronic diastolic CHF (congestive heart failure) [facility])   [place] [facility] Herold Hadassah SQUIBB, MD   6 months ago Chest discomfort   [place] [facility] Herold Hadassah SQUIBB, MD       Future Appointments             In 5 months Hammock, Tylene, NP [place] [facility]

## 2024-12-07 ENCOUNTER — Telehealth: Payer: Self-pay | Admitting: Pediatrics

## 2024-12-07 NOTE — Telephone Encounter (Signed)
 Patient called in to cancel her appt for 12/16 and would like to reschedule. I was not able to reschedule due to no openings with you. She mentioned seeing Dr Vicci but I was not sure if this has been discussed or how soon to schedule her. Please advise

## 2024-12-08 ENCOUNTER — Ambulatory Visit: Admitting: Pediatrics

## 2024-12-08 NOTE — Telephone Encounter (Signed)
 Scheduled

## 2024-12-30 ENCOUNTER — Other Ambulatory Visit: Payer: Self-pay | Admitting: Pediatrics

## 2025-01-01 ENCOUNTER — Other Ambulatory Visit: Payer: Self-pay

## 2025-01-01 ENCOUNTER — Other Ambulatory Visit: Payer: Self-pay | Admitting: Nurse Practitioner

## 2025-01-01 NOTE — Telephone Encounter (Signed)
 Requested medication (s) are due for refill today: No  Requested medication (s) are on the active medication list: No  Last refill:  05/21/23  Future visit scheduled: Yes  Notes to clinic:  Unable to refuse due to non-delegated medicine. Routing to office for refusal.     Requested Prescriptions  Pending Prescriptions Disp Refills   traMADol  (ULTRAM ) 50 MG tablet [Pharmacy Med Name: TRAMADOL  HCL 50 MG TAB] 30 tablet     Sig: TAKE ONE TABLET BY MOUTH EVERY SIX HOURSAS NEEDED FOR MODERATE PAIN     Not Delegated - Analgesics:  Opioid Agonists Failed - 01/01/2025  5:24 PM      Failed - This refill cannot be delegated      Failed - Urine Drug Screen completed in last 360 days      Passed - Valid encounter within last 3 months    Recent Outpatient Visits           2 months ago Type 2 diabetes mellitus with peripheral neuropathy [facility])   [place] [facility] Herold Hadassah SQUIBB, MD   2 months ago Type 2 diabetes mellitus with peripheral neuropathy [facility])   [place] [facility] Herold Hadassah SQUIBB, MD   2 months ago Type 2 diabetes mellitus with peripheral neuropathy (HCC)   [place] [facility] [place], [place] T, NP   7 months ago Chronic diastolic CHF (congestive heart failure) [facility])   [place] [facility] Herold Hadassah SQUIBB, MD   7 months ago Chest discomfort   [place] Herold Hadassah SQUIBB, MD       Future Appointments             In 4 months Hammock, Tylene, NP [place] [facility]

## 2025-01-01 NOTE — Telephone Encounter (Signed)
 Patient of Dr. Herold. Has TOC appointment scheduled next month with Dr. Vicci.

## 2025-01-05 ENCOUNTER — Other Ambulatory Visit: Payer: Self-pay | Admitting: Family Medicine

## 2025-01-06 NOTE — Telephone Encounter (Signed)
 Requested medication (s) are due for refill today: no   Requested medication (s) are on the active medication list: no   Last refill:  completed  course 09/12/23  Future visit scheduled: yes 01/29/25  Notes to clinic:  not delegated per protocol. Medication not on current med list. Completed course 09/12/23 by K. Platter, [place].       Requested Prescriptions  Pending Prescriptions Disp Refills   traMADol  (ULTRAM ) 50 MG tablet [Pharmacy Med Name: TRAMADOL  HCL 50 MG TAB] 30 tablet     Sig: TAKE ONE TABLET BY MOUTH EVERY SIX HOURSAS NEEDED FOR MODERATE PAIN     Not Delegated - Analgesics:  Opioid Agonists Failed - 01/06/2025 11:22 AM      Failed - This refill cannot be delegated      Failed - Urine Drug Screen completed in last 360 days      Passed - Valid encounter within last 3 months    Recent Outpatient Visits           2 months ago Type 2 diabetes mellitus with peripheral neuropathy [facility])   [place] [facility] Herold Hadassah SQUIBB, MD   2 months ago Type 2 diabetes mellitus with peripheral neuropathy [facility])   [place] [facility] Herold Hadassah SQUIBB, MD   2 months ago Type 2 diabetes mellitus with peripheral neuropathy (HCC)   [place] [facility] [place], Merrick T, NP   7 months ago Chronic diastolic CHF (congestive heart failure) [facility])   [place] [facility] Herold Hadassah SQUIBB, MD   7 months ago Chest discomfort   [place] [facility] Herold Hadassah SQUIBB, MD       Future Appointments             In 4 months Hammock, Tylene, NP [place] [facility]

## 2025-01-07 ENCOUNTER — Emergency Department

## 2025-01-07 ENCOUNTER — Emergency Department
Admission: EM | Admit: 2025-01-07 | Discharge: 2025-01-07 | Disposition: A | Discharge status: Home / Self Care | Attending: Emergency Medicine | Admitting: Emergency Medicine

## 2025-01-07 ENCOUNTER — Other Ambulatory Visit: Payer: Self-pay

## 2025-01-07 DIAGNOSIS — R0602 Shortness of breath: Secondary | ICD-10-CM | POA: Diagnosis present

## 2025-01-07 DIAGNOSIS — G309 Alzheimer's disease, unspecified: Secondary | ICD-10-CM | POA: Insufficient documentation

## 2025-01-07 DIAGNOSIS — Z7901 Long term (current) use of anticoagulants: Secondary | ICD-10-CM | POA: Insufficient documentation

## 2025-01-07 DIAGNOSIS — I5030 Unspecified diastolic (congestive) heart failure: Secondary | ICD-10-CM | POA: Insufficient documentation

## 2025-01-07 DIAGNOSIS — I2510 Atherosclerotic heart disease of native coronary artery without angina pectoris: Secondary | ICD-10-CM | POA: Diagnosis not present

## 2025-01-07 DIAGNOSIS — E039 Hypothyroidism, unspecified: Secondary | ICD-10-CM | POA: Insufficient documentation

## 2025-01-07 DIAGNOSIS — R051 Acute cough: Secondary | ICD-10-CM

## 2025-01-07 DIAGNOSIS — E119 Type 2 diabetes mellitus without complications: Secondary | ICD-10-CM | POA: Diagnosis not present

## 2025-01-07 DIAGNOSIS — R7989 Other specified abnormal findings of blood chemistry: Secondary | ICD-10-CM | POA: Insufficient documentation

## 2025-01-07 DIAGNOSIS — I110 Hypertensive heart disease with heart failure: Secondary | ICD-10-CM | POA: Insufficient documentation

## 2025-01-07 DIAGNOSIS — I4891 Unspecified atrial fibrillation: Secondary | ICD-10-CM | POA: Insufficient documentation

## 2025-01-07 LAB — BASIC METABOLIC PANEL WITH GFR
Anion gap: 12 (ref 5–15)
BUN: 16 mg/dL (ref 8–23)
CO2: 22 mmol/L (ref 22–32)
Calcium: 9.6 mg/dL (ref 8.9–10.3)
Chloride: 104 mmol/L (ref 98–111)
Creatinine, Ser: 0.68 mg/dL (ref 0.44–1.00)
GFR, Estimated: >60 mL/min
Glucose, Bld: 255 mg/dL — ABNORMAL HIGH (ref 70–99)
Potassium: 4.3 mmol/L (ref 3.5–5.1)
Sodium: 137 mmol/L (ref 135–145)

## 2025-01-07 LAB — PRO BRAIN NATRIURETIC PEPTIDE: Pro Brain Natriuretic Peptide: 931 pg/mL — ABNORMAL HIGH

## 2025-01-07 LAB — RESP PANEL BY RT-PCR (RSV, FLU A&B, COVID)  RVPGX2
Influenza A by PCR: NEGATIVE
Influenza B by PCR: NEGATIVE
Resp Syncytial Virus by PCR: NEGATIVE
SARS Coronavirus 2 by RT PCR: NEGATIVE

## 2025-01-07 LAB — CBC
HCT: 40.5 % (ref 36.0–46.0)
Hemoglobin: 13.9 g/dL (ref 12.0–15.0)
MCH: 31.9 pg (ref 26.0–34.0)
MCHC: 34.3 g/dL (ref 30.0–36.0)
MCV: 92.9 fL (ref 80.0–100.0)
Platelets: 263 K/uL (ref 150–400)
RBC: 4.36 MIL/uL (ref 3.87–5.11)
RDW: 12.8 % (ref 11.5–15.5)
WBC: 8.2 K/uL (ref 4.0–10.5)
nRBC: 0 % (ref 0.0–0.2)

## 2025-01-07 LAB — TROPONIN T, HIGH SENSITIVITY: Troponin T High Sensitivity: 18 ng/L (ref 0–19)

## 2025-01-07 MED ORDER — BENZONATATE 100 MG PO CAPS: 100.0000 mg | ORAL_CAPSULE | Freq: Two times a day (BID) | ORAL | 0 refills | Status: DC | PRN

## 2025-01-07 NOTE — ED Provider Triage Note (Signed)
 Emergency Medicine Provider Triage Evaluation Note  Alison Richardson , a 89 y.o. female  was evaluated in triage.  Pt complains of chest congestion. Patient had EKG done by EMS which showed afib with HR 130. Has a non-productive cough.   Review of Systems  Positive: Chest congestion, cough, diarrhea Negative: fever  Physical Exam  There were no vitals taken for this visit. Gen:   Awake, no distress   Resp:  Normal effort  MSK:   Moves extremities without difficulty  Other:    Medical Decision Making  Medically screening exam initiated at 11:35 AM.  Appropriate orders placed.  Karlene Southard was informed that the remainder of the evaluation will be completed by another provider, this initial triage assessment does not replace that evaluation, and the importance of remaining in the ED until their evaluation is complete.     Cleaster Tinnie LABOR, PA-C 01/07/25 1138

## 2025-01-07 NOTE — ED Notes (Addendum)
 Pt placed on a purewick; call light in reach

## 2025-01-07 NOTE — ED Provider Notes (Signed)
 [facility] Provider Note    Event Date/Time   First MD Initiated Contact with Patient 01/07/25 1157     (approximate)   History   Chest Pain   HPI  Alison Richardson is a 89 y.o. female with history of diabetes, hypertension, CHF, Alzheimer's, atrial fibrillation on Eliquis , hypothyroidism, presenting with chest pain.  Started today.  Also with some shortness of breath last night as well as congestion for the past week.  States has been compliant with her medications, has not missed any doses of her medications including Lasix .  No unilateral calf sign or tenderness.  States no recent falls or trauma.  Independent history from EMS, she was found to be in A-fib with heart rates 108-130.  Satting 98% on room air.  Independent chart review, she was seen by cardiology in November, has history of heart failure with preserved ejection fraction, CAD, persistent A-fib.  Has prior cardiac cath that shows stable coronary anatomy, she is continued on Plavix  as well as Eliquis .  Has history of heart failure, is on metoprolol .  Had her losartan  and spironolactone  discontinued due to hypotension.  Not on any diuretic.     Physical Exam   Triage Vital Signs: ED Triage Vitals  Encounter Vitals Group     BP 01/07/25 1138 117/84     Girls Systolic BP Percentile --      Girls Diastolic BP Percentile --      Boys Systolic BP Percentile --      Boys Diastolic BP Percentile --      Pulse Rate 01/07/25 1138 (!) 103     Resp 01/07/25 1138 18     Temp 01/07/25 1138 97.7 F (36.5 C)     Temp Source 01/07/25 1138 Oral     SpO2 01/07/25 1138 98 %     Weight --      Height --      Head Circumference --      Peak Flow --      Pain Score 01/07/25 1136 0     Pain Loc --      Pain Education --      Exclude from Growth Chart --     Most recent vital signs: Vitals:   01/07/25 1138  BP: 117/84  Pulse: (!) 103  Resp: 18  Temp: 97.7 F (36.5 C)  SpO2: 98%      General: Awake, no distress.  CV:  Good peripheral perfusion.  Resp:  Normal effort.  Clear, no tachypnea or respiratory distress Abd:  No distention.  Soft nontender Other:  No unilateral calf swelling or tenderness, no lower extremity edema   ED Results / Procedures / Treatments   Labs (all labs ordered are listed, but only abnormal results are displayed) Labs Reviewed  BASIC METABOLIC PANEL WITH GFR - Abnormal; Notable for the following components:      Result Value   Glucose, Bld 255 (*)    All other components within normal limits  PRO BRAIN NATRIURETIC PEPTIDE - Abnormal; Notable for the following components:   Pro Brain Natriuretic Peptide 931.0 (*)    All other components within normal limits  RESP PANEL BY RT-PCR (RSV, FLU A&B, COVID)  RVPGX2  CBC  TROPONIN T, HIGH SENSITIVITY  TROPONIN T, HIGH SENSITIVITY     EKG  EKG shows, RVR, rate 108, normal QS, normal QTc, no obvious ischemic ST elevation, T wave flattening in aVL, V2, not significantly changed compared to prior  RADIOLOGY On my independent interpretation, chest x-ray without obvious consolidation   PROCEDURES:  Critical Care performed: No  Procedures   MEDICATIONS ORDERED IN ED: Medications - No data to display   IMPRESSION / MDM / ASSESSMENT AND PLAN / ED COURSE  I reviewed the triage vital signs and the nursing notes.                              Differential diagnosis includes, but is not limited to, viral illness, pneumonia, ACS, arrhythmia, electrolyte derangements.  Did consider PE but patient states that she has been compliant with her medications, with her cough and congestion, suspect infectious etiology above PE at this time.  Does not appear volume overloaded at this time, considered but doubt CHF.  Labs, EKG, troponin, chest x-ray, viral swab.  Reassess.  Patient's presentation is most consistent with acute presentation with potential threat to life or bodily  function.  Independent interpretation of labs and imaging below.  Patient was monitored in emergency department, BMP is mildly elevated but patient does not appear volume overloaded at this time.  Chest x-ray without obvious consolidation.  On reassessment patient states that she has no chest pain or shortness of breath.  Has been coughing in her room.  She has been in the 90s with regards to her heart rate while in the emergency department.  No medications are required for her A-fib.  No tachypnea or respiratory distress.  Considered but no indication for inpatient admission at this time, she is safe for outpatient management.  Will discharge with instructions to follow-up with primary care this week or early next week to get reassessed.  She is asking for something for cough, will send some Tessalon  Perles to her pharmacy.  Will discharge with strict return precautions.  The patient is on the cardiac monitor to evaluate for evidence of arrhythmia and/or significant heart rate changes.   Clinical Course as of 01/07/25 1349  Thu Jan 07, 2025  1227 DG Chest 2 View IMPRESSION: 1. No acute cardiopulmonary findings. Atherosclerotic calcifications. Thoracolumbar levoscoliosis and multilevel thoracic spine degenerative changes.   [TT]    Clinical Course User Index [TT] Waymond Lorelle Cummins, MD     FINAL CLINICAL IMPRESSION(S) / ED DIAGNOSES   Final diagnoses:  Acute cough  Shortness of breath  Atrial fibrillation, unspecified type (HCC)     Rx / DC Orders   ED Discharge Orders          Ordered    benzonatate  (TESSALON ) 100 MG capsule  2 times daily PRN        01/07/25 1348             Note:  This document was prepared using Dragon voice recognition software and may include unintentional dictation errors.    Waymond Lorelle Cummins, MD 01/07/25 1350

## 2025-01-07 NOTE — ED Notes (Signed)
 Pt given discharge paperwork and instructions along with home prescriptions. Pt verbalized understanding. Pt wheeled out of ED by family in no acute distress.

## 2025-01-07 NOTE — ED Triage Notes (Signed)
 Pt to ED via [facility] from [facility]. Pt reports tingling in her chest that started today, SOB since last night and congestion x1 wk.   Afib 108-130 CBG 333 98% RA 138/78

## 2025-01-08 NOTE — Telephone Encounter (Addendum)
 Hey I've denied this 3x now- PDMP says she has not filled this since 2024 and I don't have it on her med list. Has she been taking it regularly? Can we find out some more information about this please? If she has not taken it recently she will need an acute appointment for this- we will not send it in without her being seen.

## 2025-01-14 ENCOUNTER — Other Ambulatory Visit: Payer: Self-pay

## 2025-01-15 ENCOUNTER — Ambulatory Visit: Admitting: Nurse Practitioner

## 2025-01-15 ENCOUNTER — Ambulatory Visit: Payer: Self-pay

## 2025-01-15 ENCOUNTER — Encounter: Payer: Self-pay | Admitting: Nurse Practitioner

## 2025-01-15 VITALS — BP 92/66 | HR 73 | Temp 95.4°F | Ht 65.98 in | Wt 156.0 lb

## 2025-01-15 DIAGNOSIS — R040 Epistaxis: Secondary | ICD-10-CM | POA: Insufficient documentation

## 2025-01-15 NOTE — Telephone Encounter (Signed)
 Left message for patient to return call to discuss this medication

## 2025-01-15 NOTE — Patient Instructions (Addendum)
 [address] [employer], Adult A nosebleed is when blood comes out of the nose. Nosebleeds are common and can be caused by many things. They are usually not a sign of a serious medical problem. Follow these instructions at home: When you have a nosebleed:  Sit down. Tilt your head forward a little. Follow these steps: Pinch your nose with a clean towel or tissue. Keep pinching your nose for 5 minutes. Do not let go. After 5 minutes, let go of your nose. Keep doing these steps until the bleeding stops. Do not put tissues or other things in your nose to stop the bleeding. Avoid lying down or putting your head back. Use a nose spray decongestant as told by your doctor. After a nosebleed: Try not to blow your nose or sniffle for several hours. Try not to strain, lift, or bend at the waist for several days. Aspirin  and medicines that thin your blood make bleeding more likely. If you take these medicines: Ask your doctor if you should stop taking them or if you should change how much you take. Do not stop taking the medicine unless your doctor tells you to. If your nosebleed was caused by dryness, use over-the-counter saline nasal spray or gel and a humidifier as told by your doctor. This will keep the inside of your nose moist and allow it to heal. If you need to use nasal spray or gel: Choose one that is water-soluble. Use only as much as you need and use it only as often as needed. Do not lie down right away after you use it. If you get nosebleeds often, talk with your doctor about treatments. These may include: Nasal cautery. A chemical swab or electrical device is used to lightly burn tiny blood vessels inside the nose. This helps stop or prevent nosebleeds. Nasal packing. A gauze or other material is placed in the nose to keep constant pressure on the bleeding area. Contact a doctor if: You have a fever. You get nosebleeds often. You get nosebleeds more often than  usual. You bruise very easily. You have something stuck in your nose. You are bleeding in your mouth. You vomit or cough up brown material. You get a nosebleed after you start a new medicine. Get help right away if: You have a nosebleed after you fall or hurt your head. Your nosebleed does not go away after 20 minutes. You feel dizzy or weak. You have unusual bleeding from other parts of your body. You have unusual bruising on other parts of your body. You get sweaty. You vomit blood. Summary Nosebleeds are common. They are usually not a sign of a serious medical problem. When you have a nosebleed, sit down and tilt your head a little forward. Pinch your nose with a clean tissue for 5 minutes. Use saline spray or saline gel and a humidifier as told by your doctor. Get help right away if your nosebleed does not go away after 20 minutes. This information is not intended to replace advice given to you by your health care provider. Make sure you discuss any questions you have with your health care provider. Document Revised: 12/19/2021 Document Reviewed: 12/19/2021 Elsevier Patient Education  2024 [employer].

## 2025-01-15 NOTE — Progress Notes (Signed)
 "  BP 92/66 (BP Location: Left Arm, Patient Position: Sitting, Cuff Size: Normal)   Pulse 73   Temp (!) 95.4 F (35.2 C) (Oral)   Ht 5' 5.98 (1.676 m)   Wt 156 lb (70.8 kg)   SpO2 97%   BMI 25.19 kg/m    Subjective:    Patient ID: Alison Richardson, female    DOB: 11/20/1933, 89 y.o.   MRN: [id number]  HPI: Alison Richardson is a 89 y.o. female  Chief Complaint  Patient presents with   Epistaxis    Patient stated she is having a constant nose bleed from the right nostril and it's coming out fast and non-stop. She thinks it may be from Eliquis . The ENT put a long swab inside her nose to stop some of the bleeding. She also mentioned she thinks she should cut back on the medication.    NOSE BLEED Presents for nose bleed since 8 am.  EMS came and placed medication and packing, this came out and they placed a new one. Now have placed third one. When second packing came out it was still actively bleeding. Also had hand bleed over weekend that would not stop. Was taking Eliquis  once a day, but after recent hip surgery they changed this to twice a day per patient. Since this she has been bleeding more. She also reports a hand bleeding for 24 hours on Sunday, with a little sore. At baseline her nose is not dry. Does not use any nasal sprays. Currently has packing in nose that has Afrin on it that nurses placed where she lives. Cough: no Shortness of breath: no Wheezing: no Chest pain: no Chest tightness: no Chest congestion: no Nasal congestion: no Runny nose: no Post nasal drip: no Sneezing: yes Sore throat: no Swollen glands: no Sinus pressure: no Fatigue: no Treatments attempted: packing and medication   Relevant past medical, surgical, family and social history reviewed and updated as indicated. Interim medical history since our last visit reviewed. Allergies and medications reviewed and updated.  Review of Systems  Constitutional:  Negative for activity change, appetite change,  diaphoresis, fatigue and fever.  HENT:  Positive for nosebleeds.   Respiratory:  Negative for cough, chest tightness, shortness of breath and wheezing.   Cardiovascular:  Negative for chest pain, palpitations and leg swelling.  Gastrointestinal: Negative.   Neurological: Negative.   Psychiatric/Behavioral: Negative.      Per HPI unless specifically indicated above     Objective:    BP 92/66 (BP Location: Left Arm, Patient Position: Sitting, Cuff Size: Normal)   Pulse 73   Temp (!) 95.4 F (35.2 C) (Oral)   Ht 5' 5.98 (1.676 m)   Wt 156 lb (70.8 kg)   SpO2 97%   BMI 25.19 kg/m   Wt Readings from Last 3 Encounters:  01/15/25 156 lb (70.8 kg)  01/07/25 156 lb (70.8 kg)  11/06/24 163 lb 3.2 oz (74 kg)    Physical Exam Vitals and nursing note reviewed.  Constitutional:      General: She is awake. She is not in acute distress.    Appearance: She is well-developed and well-groomed. She is not ill-appearing or toxic-appearing.  HENT:     Head: Normocephalic.     Right Ear: Hearing and external ear normal.     Left Ear: Hearing and external ear normal.     Nose:     Comments: Packing in place to right nostril, scant pink tinge to packing.  Eyes:  General: Lids are normal.        Right eye: No discharge.        Left eye: No discharge.     Conjunctiva/sclera: Conjunctivae normal.     Pupils: Pupils are equal, round, and reactive to light.  Neck:     Thyroid : No thyromegaly.     Vascular: No carotid bruit.  Cardiovascular:     Rate and Rhythm: Normal rate and regular rhythm.     Heart sounds: Normal heart sounds. No murmur heard.    No gallop.  Pulmonary:     Effort: Pulmonary effort is normal. No accessory muscle usage or respiratory distress.     Breath sounds: Normal breath sounds. No decreased breath sounds, wheezing or rales.  Abdominal:     General: Bowel sounds are normal. There is no distension.     Palpations: Abdomen is soft.     Tenderness: There is no  abdominal tenderness.  Musculoskeletal:     Cervical back: Normal range of motion and neck supple.     Right lower leg: No edema.     Left lower leg: No edema.  Lymphadenopathy:     Cervical: No cervical adenopathy.  Skin:    General: Skin is warm and dry.  Neurological:     Mental Status: She is alert and oriented to person, place, and time.     Deep Tendon Reflexes: Reflexes are normal and symmetric.     Reflex Scores:      Brachioradialis reflexes are 2+ on the right side and 2+ on the left side.      Patellar reflexes are 2+ on the right side and 2+ on the left side. Psychiatric:        Attention and Perception: Attention normal.        Mood and Affect: Mood normal.        Speech: Speech normal.        Behavior: Behavior normal. Behavior is cooperative.        Thought Content: Thought content normal.     Results for orders placed or performed during the hospital encounter of 01/07/25  Resp panel by RT-PCR (RSV, Flu A&B, Covid) Anterior Nasal Swab   Collection Time: 01/07/25 12:33 PM   Specimen: Anterior Nasal Swab  Result Value Ref Range   SARS Coronavirus 2 by RT PCR NEGATIVE NEGATIVE   Influenza A by PCR NEGATIVE NEGATIVE   Influenza B by PCR NEGATIVE NEGATIVE   Resp Syncytial Virus by PCR NEGATIVE NEGATIVE  Basic metabolic panel   Collection Time: 01/07/25 12:33 PM  Result Value Ref Range   Sodium 137 135 - 145 mmol/L   Potassium 4.3 3.5 - 5.1 mmol/L   Chloride 104 98 - 111 mmol/L   CO2 22 22 - 32 mmol/L   Glucose, Bld 255 (H) 70 - 99 mg/dL   BUN 16 8 - 23 mg/dL   Creatinine, Ser 9.31 0.44 - 1.00 mg/dL   Calcium  9.6 8.9 - 10.3 mg/dL   GFR, Estimated >39 >39 mL/min   Anion gap 12 5 - 15  CBC   Collection Time: 01/07/25 12:33 PM  Result Value Ref Range   WBC 8.2 4.0 - 10.5 K/uL   RBC 4.36 3.87 - 5.11 MIL/uL   Hemoglobin 13.9 12.0 - 15.0 g/dL   HCT 59.4 63.9 - 53.9 %   MCV 92.9 80.0 - 100.0 fL   MCH 31.9 26.0 - 34.0 pg   MCHC 34.3 30.0 - 36.0 g/dL  RDW  12.8 11.5 - 15.5 %   Platelets 263 150 - 400 K/uL   nRBC 0.0 0.0 - 0.2 %  Pro Brain natriuretic peptide   Collection Time: 01/07/25 12:33 PM  Result Value Ref Range   Pro Brain Natriuretic Peptide 931.0 (H) <300.0 pg/mL  Troponin T, High Sensitivity   Collection Time: 01/07/25 12:33 PM  Result Value Ref Range   Troponin T High Sensitivity 18 0 - 19 ng/L      Assessment & Plan:   Problem List Items Addressed This Visit       Other   Bleeding from the nose - Primary   To right nostril, started at 8 am and continues. Has been packed three times now and per patient report when packing comes out it continues to actively bleed.  Spoke to Dr. Vicci and agrees with plan not to take current packing out and attempt to get patient into ENT this afternoon, if unable to then patient will need to go to ER for evaluation. Hold Eliquis  this evening and tomorrow, if not actively bleeding Sunday could restart but cautiously and would only take once a day for now as did not have increased bleeding issues with this dose. Reached out to Dr. Gollan, her cardiologist, via secure chat to alert to this change.        Follow up plan: Return if symptoms worsen or fail to improve.      "

## 2025-01-15 NOTE — Telephone Encounter (Signed)
" °  FYI Only or Action Required?: FYI only for provider: appointment scheduled on 01/15/25.  Patient was last seen in primary care on 10/29/2024 by Alison Hadassah SQUIBB, MD.  Called Nurse Triage reporting Epistaxis.  Symptoms began today.  Interventions attempted: Other: Nurse packed nose.  Symptoms are: stable.  Triage Disposition: See Physician Within 24 Hours  Patient/caregiver understands and will follow disposition.  Message from [name] sent at 01/15/2025 10:35 AM EST  Summary: nose bleed   Reason for Triage: continuous nose bleed         Reason for Disposition  Taking Coumadin (warfarin) or other strong blood thinner, or known bleeding disorder (e.g., thrombocytopenia)  Answer Assessment - Initial Assessment Questions 1. AMOUNT OF BLEEDING: How bad is the bleeding? How much blood was lost? Has the bleeding stopped?     seep 2. ONSET: When did the nosebleed start?      This am  3. FREQUENCY: How many nosebleeds have you had in the last 24 hours?      1  4. RECURRENT SYMPTOMS: Have there been other recent nosebleeds? If Yes, ask: How long did it take you to stop the bleeding? What worked best?      Yes earlier in week  5. CAUSE: What do you think caused this nosebleed?     [name] is on Eliquis   6. LOCAL FACTORS: Do you have any cold symptoms?, Have you been rubbing or picking at your nose?     Cold sx  7. SYSTEMIC FACTORS: Do you have high blood pressure or any bleeding problems?     *No Answer* 8. BLOOD THINNERS: Do you take any blood thinners? (e.g., aspirin , clopidogrel  / Plavix , coumadin, heparin ). Notes: Other strong blood thinners include: Arixtra (fondaparinux), Eliquis  (apixaban ), Pradaxa (dabigatran), and Xarelto (rivaroxaban).     Eliquis  9. OTHER SYMPTOMS: Do you have any other symptoms? (e.g., lightheadedness)     no  Protocols used: Nosebleed-A-AH  "

## 2025-01-15 NOTE — Assessment & Plan Note (Addendum)
 To right nostril, started at 8 am and continues. Has been packed three times now and per patient report when packing comes out it continues to actively bleed.  Spoke to Dr. Vicci and agrees with plan not to take current packing out and attempt to get patient into ENT this afternoon, if unable to then patient will need to go to ER for evaluation. Hold Eliquis  this evening and tomorrow, if not actively bleeding Sunday could restart but cautiously and would only take once a day for now as did not have increased bleeding issues with this dose. Reached out to Dr. Gollan, her cardiologist, via secure chat to alert to this change.

## 2025-01-16 ENCOUNTER — Encounter: Payer: Self-pay | Admitting: Nurse Practitioner

## 2025-01-19 ENCOUNTER — Telehealth: Payer: Self-pay

## 2025-01-19 NOTE — Telephone Encounter (Signed)
 Called and notified patient's daughter about medication.

## 2025-01-19 NOTE — Telephone Encounter (Signed)
-----   Message from Jolene Cannady, NP sent at 01/15/2025  2:48 PM EST ----- Please reach out to patient or caregiver and alert them that Dr. Gollan responded. He does want her to stay on 5 MG twice a day of Eliquis  for now. She can hold Eliquis  for a few days as we discussed. So in this scenario try to restart on Monday if no active bleeding. If she continues to have issues with nose bleeds then they may change her dosing, but for now her wants her to take 5 MG twice a day.

## 2025-01-22 ENCOUNTER — Telehealth: Payer: Self-pay

## 2025-01-22 NOTE — Progress Notes (Unsigned)
 Pharmacy Quality Measure Review  This patient is appearing on a report for being at risk of failing the adherence measure for diabetes medications this calendar year.   Medication: Ozempic  0.25mg  or 0.5mg  Inj Last fill date: 10/26/2024 for 56 day supply  On her 10/29/2024 visit, it was noted that Ozempic  was temporarily discontinued from having adverse effects due to resuming a high dose following a hospital stay. It was discussed to resume Ozempic  if blood sugars were > 200mg /dL, which they have been. However, patient hasn't had a follow up visit for diabetes management since 10/29/24.    Lisle Slocumb Student - PharmD.

## 2025-01-29 ENCOUNTER — Encounter: Payer: Self-pay | Admitting: Family Medicine

## 2025-01-29 ENCOUNTER — Ambulatory Visit: Admitting: Family Medicine

## 2025-01-29 VITALS — BP 113/66 | HR 98 | Temp 97.4°F | Resp 18 | Ht 65.98 in | Wt 164.0 lb

## 2025-01-29 DIAGNOSIS — F331 Major depressive disorder, recurrent, moderate: Secondary | ICD-10-CM

## 2025-01-29 DIAGNOSIS — E039 Hypothyroidism, unspecified: Secondary | ICD-10-CM

## 2025-01-29 DIAGNOSIS — Z0000 Encounter for general adult medical examination without abnormal findings: Secondary | ICD-10-CM

## 2025-01-29 DIAGNOSIS — L570 Actinic keratosis: Secondary | ICD-10-CM

## 2025-01-29 DIAGNOSIS — E1169 Type 2 diabetes mellitus with other specified complication: Secondary | ICD-10-CM

## 2025-01-29 DIAGNOSIS — I152 Hypertension secondary to endocrine disorders: Secondary | ICD-10-CM

## 2025-01-29 DIAGNOSIS — E538 Deficiency of other specified B group vitamins: Secondary | ICD-10-CM

## 2025-01-29 DIAGNOSIS — E1142 Type 2 diabetes mellitus with diabetic polyneuropathy: Secondary | ICD-10-CM

## 2025-01-29 LAB — BAYER DCA HB A1C WAIVED: HB A1C (BAYER DCA - WAIVED): 7.1 % — ABNORMAL HIGH (ref 4.8–5.6)

## 2025-01-29 MED ORDER — METOPROLOL TARTRATE 50 MG PO TABS: 50.0000 mg | ORAL_TABLET | Freq: Two times a day (BID) | ORAL | 1 refills | Status: AC

## 2025-01-29 MED ORDER — ESSENTIAL ONE DAILY MULTIVIT PO TABS: 1.0000 | ORAL_TABLET | Freq: Every day | ORAL | 1 refills | Status: AC

## 2025-01-29 MED ORDER — VITAMIN C 250 MG PO TABS: 500.0000 mg | ORAL_TABLET | Freq: Every day | ORAL | 1 refills | Status: AC

## 2025-01-29 MED ORDER — GABAPENTIN 100 MG PO CAPS: 200.0000 mg | ORAL_CAPSULE | Freq: Two times a day (BID) | ORAL | 1 refills | Status: AC

## 2025-01-29 MED ORDER — AZELASTINE HCL 0.1 % NA SOLN: 2.0000 | Freq: Two times a day (BID) | NASAL | 12 refills | Status: AC

## 2025-01-29 MED ORDER — MIDODRINE HCL 5 MG PO TABS: 5.0000 mg | ORAL_TABLET | Freq: Three times a day (TID) | ORAL | 1 refills | Status: AC

## 2025-01-29 MED ORDER — APIXABAN 5 MG PO TABS: 5.0000 mg | ORAL_TABLET | Freq: Two times a day (BID) | ORAL | 1 refills | Status: AC

## 2025-01-29 MED ORDER — FAMOTIDINE 20 MG PO TABS: 20.0000 mg | ORAL_TABLET | Freq: Two times a day (BID) | ORAL | 1 refills | Status: AC

## 2025-01-29 MED ORDER — VITAMIN D3 50 MCG (2000 UT) PO CAPS: 2000.0000 [IU] | ORAL_CAPSULE | Freq: Every day | ORAL | 1 refills | Status: AC

## 2025-01-29 MED ORDER — CLOPIDOGREL BISULFATE 75 MG PO TABS: 75.0000 mg | ORAL_TABLET | Freq: Every day | ORAL | 1 refills | Status: AC

## 2025-01-29 MED ORDER — VENLAFAXINE HCL ER 75 MG PO CP24: 75.0000 mg | ORAL_CAPSULE | Freq: Every day | ORAL | 0 refills | Status: AC

## 2025-01-29 MED ORDER — DONEPEZIL HCL 10 MG PO TABS: 10.0000 mg | ORAL_TABLET | Freq: Every day | ORAL | 1 refills | Status: AC

## 2025-01-29 MED ORDER — SPIRONOLACTONE 25 MG PO TABS: 12.5000 mg | ORAL_TABLET | Freq: Every day | ORAL | 1 refills | Status: AC

## 2025-01-29 MED ORDER — PRAVASTATIN SODIUM 20 MG PO TABS: 20.0000 mg | ORAL_TABLET | Freq: Every day | ORAL | 1 refills | Status: AC

## 2025-01-29 MED ORDER — CYANOCOBALAMIN 1000 MCG/ML IJ SOLN
1000.0000 ug | Freq: Once | INTRAMUSCULAR | Status: AC
  Administered 2025-01-29: 1000 ug via INTRAMUSCULAR

## 2025-01-29 NOTE — Assessment & Plan Note (Signed)
 Still having chlelosis. Will start B12 shots and recheck labs in 3 months.

## 2025-01-29 NOTE — Progress Notes (Unsigned)
 "  BP 113/66 (BP Location: Left Arm, Patient Position: Sitting, Cuff Size: Large)   Pulse 98   Temp (!) 97.4 F (36.3 C) (Oral)   Resp 18   Ht 5' 5.98 (1.676 m)   Wt 164 lb (74.4 kg)   SpO2 95%   BMI 26.48 kg/m    Subjective:    Patient ID: Alison Richardson, female    DOB: 1933-11-14, 89 y.o.   MRN: [id number]  HPI: Alison Richardson is a 89 y.o. female who presents today to transfer care after her previous physician left the practice.   Chief Complaint  Patient presents with   Transitions Of Care   Diabetes   Hypertension   Hyperlipidemia   Other    Skinning on the face would like help with that and as well as pain management    Broke her femur and her knee cap  DIABETES- did not tolerate her ozempic . It made her sick for 3 days and she stopped it. She had been off of it when she was in the hospital and then went back up on the higher dose.  Hypoglycemic episodes:{Blank single:19197::yes,no} Polydipsia/polyuria: {Blank single:19197::yes,no} Visual disturbance: {Blank single:19197::yes,no} Chest pain: {Blank single:19197::yes,no} Paresthesias: {Blank single:19197::yes,no} Glucose Monitoring: no  Accucheck frequency: Not Checking Taking Insulin ?: {Blank single:19197::yes,no}  Long acting insulin :  Short acting insulin : Blood Pressure Monitoring: {Blank single:19197::not checking,rarely,daily,weekly,monthly,a few times a day,a few times a week,a few times a month} Retinal Examination: {Blank single:19197::Up to Date,Not up to Date} Foot Exam: {Blank single:19197::Up to Date,Not up to Date} Diabetic Education: {Blank single:19197::Completed,Not Completed} Pneumovax: {Blank single:19197::Up to Date,Not up to Date,unknown} Influenza: {Blank single:19197::Up to Date,Not up to Date,unknown} Aspirin : {Blank single:19197::yes,no}  HYPERTENSION / HYPERLIPIDEMIA Satisfied with current treatment? {Blank  single:19197::yes,no} Duration of hypertension: {Blank single:19197::chronic,months,years} BP monitoring frequency: {Blank single:19197::not checking,rarely,daily,weekly,monthly,a few times a day,a few times a week,a few times a month} BP range:  BP medication side effects: {Blank single:19197::yes,no} Past BP meds: {Blank multiple:19196::none,amlodipine ,amlodipine /benazepril,atenolol,benazepril,benazepril/HCTZ,bisoprolol (bystolic),carvedilol,chlorthalidone,clonidine,diltiazem ,exforge HCT,HCTZ,irbesartan  (avapro ),labetalol ,lisinopril ,lisinopril -HCTZ,losartan  (cozaar ),methyldopa,nifedipine,olmesartan (benicar),olmesartan-HCTZ,quinapril,ramipril,spironalactone,tekturna,valsartan ,valsartan -HCTZ,verapamil } Duration of hyperlipidemia: {Blank single:19197::chronic,months,years} Cholesterol medication side effects: {Blank single:19197::yes,no} Cholesterol supplements: {Blank multiple:19196::none,fish oil,niacin,red yeast rice} Past cholesterol medications: {Blank multiple:19196::none,atorvastain (lipitor ),lovastatin (mevacor),pravastatin  (pravachol ),rosuvastatin (crestor),simvastatin (zocor),vytorin,fenofibrate (tricor),gemfibrozil,ezetimide (zetia),niaspan,lovaza} Medication compliance: {Blank single:19197::excellent compliance,good compliance,fair compliance,poor compliance} Aspirin : {Blank single:19197::yes,no} Recent stressors: {Blank single:19197::yes,no} Recurrent headaches: {Blank single:19197::yes,no} Visual changes: {Blank single:19197::yes,no} Palpitations: {Blank single:19197::yes,no} Dyspnea: {Blank single:19197::yes,no} Chest pain: {Blank single:19197::yes,no} Lower extremity edema: {Blank single:19197::yes,no} Dizzy/lightheaded: {Blank single:19197::yes,no}  HYPOTHYROIDISM Thyroid  control status:{Blank  single:19197::controlled,uncontrolled,better,worse,exacerbated,stable} Satisfied with current treatment? {Blank single:19197::yes,no} Medication side effects: {Blank single:19197::yes,no} Medication compliance: {Blank single:19197::excellent compliance,good compliance,fair compliance,poor compliance} Etiology of hypothyroidism:  Recent dose adjustment:{Blank single:19197::yes,no} Fatigue: {Blank single:19197::yes,no} Cold intolerance: {Blank single:19197::yes,no} Heat intolerance: {Blank single:19197::yes,no} Weight gain: {Blank single:19197::yes,no} Weight loss: {Blank single:19197::yes,no} Constipation: {Blank single:19197::yes,no} Diarrhea/loose stools: {Blank single:19197::yes,no} Palpitations: {Blank single:19197::yes,no} Lower extremity edema: {Blank single:19197::yes,no} Anxiety/depressed mood: {Blank single:19197::yes,no}  DEPRESSION Mood status: {Blank single:19197::controlled,uncontrolled,better,worse,exacerbated,stable} Satisfied with current treatment?: {Blank single:19197::yes,no} Symptom severity: {Blank single:19197::mild,moderate,severe}  Duration of current treatment : {Blank single:19197::chronic,months,years} Side effects: {Blank single:19197::yes,no} Medication compliance: {Blank single:19197::excellent compliance,good compliance,fair compliance,poor compliance} Psychotherapy/counseling: {Blank single:19197::yes,no} {Blank single:19197::current,in the past} Previous psychiatric medications: {Blank multiple:19196::abilify,amitryptiline,buspar,celexa,cymbalta,depakote,effexor ,lamictal,lexapro,lithium,nortryptiline,paxil,prozac,pristiq (desvenlafaxine,seroquel,wellbutrin,zoloft,zyprexa} Depressed mood: {Blank single:19197::yes,no} Anxious mood: {Blank single:19197::yes,no} Anhedonia: {Blank  single:19197::yes,no} Significant weight loss or gain: {Blank single:19197::yes,no} Insomnia: {Blank single:19197::yes,no} {Blank single:19197::hard to fall asleep,hard to stay asleep} Fatigue: {Blank single:19197::yes,no} Feelings of worthlessness or guilt: {Blank single:19197::yes,no} Impaired concentration/indecisiveness: {Blank single:19197::yes,no} Suicidal ideations: {Blank single:19197::yes,no} Hopelessness: {Blank single:19197::yes,no} Crying spells: {Blank single:19197::yes,no}    05/05/2024    1:25 PM 04/02/2024  4:01 PM 04/09/2023   11:04 AM 01/15/2023    3:47 PM 08/21/2022    1:47 PM  Depression screen PHQ 2/9  Decreased Interest 0 -- 0 0 0  Down, Depressed, Hopeless 0  0 0 0  PHQ - 2 Score 0  0 0 0  Altered sleeping 0  0 3   Tired, decreased energy 0  0 3   Change in appetite 0  0 0   Feeling bad or failure about yourself  0  0 0   Trouble concentrating 0  0 0   Moving slowly or fidgety/restless 0  0 0   Suicidal thoughts 0  0 0   PHQ-9 Score 0   0  6    Difficult doing work/chores   Not difficult at all Not difficult at all      Data saved with a previous flowsheet row definition     Active Ambulatory Problems    Diagnosis Date Noted   Vitamin B12 deficiency 06/19/2018   Insomnia 06/19/2018   Hypertension associated with diabetes (HCC) 06/19/2018   Hypothyroidism 06/19/2018   Overactive bladder 06/19/2018   Type 2 diabetes mellitus with peripheral neuropathy (HCC) 01/21/2019   Hyperlipidemia associated with type 2 diabetes mellitus (HCC) 09/08/2018   PAD (peripheral artery disease) 01/06/2019   Primary osteoarthritis involving multiple joints 06/18/2018   Heart failure with recovered ejection fraction (HFrecEF) (HCC) 10/13/2020   Cor pulmonale, chronic (HCC) 09/27/2019   Late onset Alzheimer's disease without behavioral disturbance (HCC) 09/23/2019   Obstructive sleep apnea 07/09/2019   NSTEMI (non-ST  elevated myocardial infarction) (HCC) 10/10/2021   DDD (degenerative disc disease), lumbar    At high risk for falls 04/03/2022   History of cerebral aneurysm 04/03/2022   Gastroesophageal reflux disease 02/12/2022   Depression, major, recurrent, moderate (HCC) 12/09/2022   CAD (coronary artery disease) 12/09/2022   Lumbar radiculopathy 02/06/2023   Asthma 07/04/2024   Longstanding persistent atrial fibrillation (HCC) 07/06/2024   Resolved Ambulatory Problems    Diagnosis Date Noted   Anxiety disorder 06/19/2018   Depression, major, single episode, mild 06/19/2018   Paroxysmal atrial fibrillation with rapid ventricular response (HCC) 06/19/2018   Atrial flutter with rapid ventricular response (HCC) 06/19/2018   Pure hypercholesterolemia 06/19/2018   Protein-calorie malnutrition 06/19/2018   Type 2 diabetes mellitus with diabetic neuropathy (HCC) 06/19/2018   Knee pain 06/19/2018   Cognitive impairment, mild, so stated 06/19/2018   Osteoarthritis of right knee 06/19/2018   Generalized muscle weakness 06/19/2018   Difficulty in walking, not elsewhere classified 06/19/2018   Cerebral aneurysm 04/29/2013   Dyslipidemia 01/21/2019   Major depression in remission 06/18/2018   Hypertriglyceridemia 11/01/2015   Peripheral neuropathy, idiopathic 09/08/2018   Preop testing 01/06/2019   PSVT (paroxysmal supraventricular tachycardia) 01/21/2019   Tenosynovitis of foot 01/21/2019   Syncope 09/29/2019   Syncope and collapse 09/30/2019   DOE (dyspnea on exertion) 05/09/2020   Acute on chronic congestive heart failure (HCC) 10/07/2020   Elevated troponin 10/07/2020   CHF (congestive heart failure) (HCC) 10/07/2020   Leukocytosis 10/07/2020   Acute on chronic systolic CHF (congestive heart failure) (HCC) 10/31/2021   HLD (hyperlipidemia) 10/31/2021   PAF (paroxysmal atrial fibrillation) (HCC) 10/31/2021   CAD (coronary artery disease) 10/31/2021    Acute respiratory failure with hypoxia (HCC) 10/31/2021   Acute on chronic combined systolic and diastolic CHF (congestive heart failure) (HCC) 10/31/2021   Fall 01/25/2022   Acute cystitis 01/25/2022   Severe sepsis (HCC) 02/07/2022  Weight gain 03/02/2022   Upper respiratory tract infection 03/19/2022   Painful urination 03/19/2022   Use of cane as ambulatory aid 04/03/2022   History of non-ST elevation myocardial infarction (NSTEMI) 04/03/2022   Acute cough 04/03/2022   Intractable pain 05/15/2022   Humerus fracture 05/16/2022   Atrial fibrillation, chronic (HCC) 05/16/2022   Humeral surgical neck fracture 05/16/2022   Chronic cough 10/15/2022   COVID-19 11/06/2022   Multifocal pneumonia 12/09/2022   Alzheimer disease (HCC) 12/09/2022   Respiratory distress 12/12/2022   Hypokalemia 12/12/2022   Moderate asthma without complication 12/12/2022   Near syncope 04/20/2023   Puncture wound of forehead 06/11/2023   Atrial fibrillation and flutter (HCC) 01/06/2019   DM type 2 with diabetic mixed hyperlipidemia (HCC) 09/08/2018   Thoracic back pain 04/12/2023   Sepsis due to pneumonia (HCC) 03/26/2024   Chronic atrial fibrillation with RVR (HCC) 03/26/2024   Essential hypertension 03/26/2024   Dyslipidemia 03/26/2024   Dementia, senile with depression (HCC) 03/26/2024   Hypotension 05/08/2024   DM type 2 (diabetes mellitus, type 2) (HCC) 05/14/2024   Lactic acidosis 05/14/2024   Closed left hip fracture (HCC) 07/04/2024   Fall at home, initial encounter 07/04/2024   Nausea & vomiting 07/04/2024   Paroxysmal atrial fibrillation (HCC) 07/05/2024   Preop cardiovascular exam 07/05/2024   Fall 07/08/2024   Hypotension 07/09/2024   Acute blood loss anemia 07/13/2024   History of fracture of left hip 07/15/2024   Multiple open wounds of foot 10/13/2024   Bleeding from the nose 01/15/2025   Past Medical History:  Diagnosis Date    Arrhythmia    Atrial flutter (HCC) 01/2018   Basal cell carcinoma of back    Basal cell carcinoma of lip    Diabetes mellitus type II, controlled (HCC)    Diverticulosis    Dysrhythmia    Dysthymia    GERD (gastroesophageal reflux disease)    History of falling 04/03/2022   History of meniscal tear    Hyperlipidemia    Hypertension    Late onset Alzheimer's disease with behavioral disturbance (HCC)    Mild pulmonary hypertension (HCC)    Peripheral vascular disease    Seasonal allergic rhinitis    Sleep apnea    Past Surgical History:  Procedure Laterality Date   APPENDECTOMY  1946   BASAL CELL CARCINOMA EXCISION     2006 and 2009 removed from back and lip   CARDIOVASCULAR STRESS TEST  2015   nuclear cardiac stress test negative for ischemia - Dr. Roosevelt   CATARACT EXTRACTION, BILATERAL  2006   COLONOSCOPY  2014   COLONOSCOPY N/A 08/19/2020   Procedure: COLONOSCOPY;  Surgeon: Maryruth Ole DASEN, MD;  Location: [facility] ENDOSCOPY;  Service: Endoscopy;  Laterality: N/A;   CORONARY STENT INTERVENTION N/A 05/19/2024   Procedure: CORONARY STENT INTERVENTION;  Surgeon: Mady Bruckner, MD;  Location: [facility] INVASIVE CV LAB;  Service: Cardiovascular;  Laterality: N/A;   ECTOPIC PREGNANCY SURGERY  1957   ESOPHAGOGASTRODUODENOSCOPY N/A 08/19/2020   Procedure: ESOPHAGOGASTRODUODENOSCOPY (EGD);  Surgeon: Maryruth Ole DASEN, MD;  Location: [facility] ENDOSCOPY;  Service: Endoscopy;  Laterality: N/A;   INJECTION KNEE Left 05/2015   KNEE SURGERY Right    LEFT HEART CATH AND CORONARY ANGIOGRAPHY N/A 10/12/2021   Procedure: LEFT HEART CATH AND CORONARY ANGIOGRAPHY;  Surgeon: Hester Wolm PARAS, MD;  Location: [facility] INVASIVE CV LAB;  Service: Cardiovascular;  Laterality: N/A;   ORIF HIP FRACTURE Left 07/06/2024   Procedure: OPEN REDUCTION INTERNAL FIXATION HIP;  Surgeon:  Maryrose Cordella Charleston, MD;  Location: [facility] ORS;  Service: Orthopedics;  Laterality: Left;  Open  reduction/internal fixation with Synthes TFN-A trochanteric nail system.  Fracture table and C arm.   REPLACEMENT TOTAL KNEE Left 09/06/2016   Dr. Chesley   RIGHT/LEFT HEART CATH AND CORONARY ANGIOGRAPHY N/A 05/19/2024   Procedure: RIGHT/LEFT HEART CATH AND CORONARY ANGIOGRAPHY;  Surgeon: Mady Bruckner, MD;  Location: [facility] INVASIVE CV LAB;  Service: Cardiovascular;  Laterality: N/A;   TONSILLECTOMY AND ADENOIDECTOMY  1953   TOTAL ABDOMINAL HYSTERECTOMY  1986   DU B   Outpatient Encounter Medications as of 01/29/2025  Medication Sig Note   Accu-Chek Softclix Lancets lancets SMARTSIG:Lancet Topical    albuterol  (PROVENTIL ) (2.5 MG/3ML) 0.083% nebulizer solution Take 3 mLs (2.5 mg total) by nebulization every 6 (six) hours as needed for wheezing or shortness of breath.    apixaban  (ELIQUIS ) 5 MG TABS tablet Take 1 tablet (5 mg total) by mouth 2 (two) times daily.    Blood Glucose Monitoring Suppl (ACCU-CHEK GUIDE ME) w/Device KIT 1 each by Does not apply route daily.    clopidogrel  (PLAVIX ) 75 MG tablet Take 1 tablet (75 mg total) by mouth daily with breakfast.    cyanocobalamin  1000 MCG tablet Take 1 tablet (1,000 mcg total) by mouth daily.    donepezil  (ARICEPT ) 10 MG tablet TAKE ONE TABLET (10 MG) BY MOUTH AT BEDTIME    famotidine  (PEPCID ) 20 MG tablet TAKE 1 TABLET BY MOUTH TWICE DAILY BEFORE A MEAL    feeding supplement, GLUCERNA SHAKE, (GLUCERNA SHAKE) LIQD Take 237 mLs by mouth 3 (three) times daily between meals.    ferrous sulfate 325 (65 FE) MG EC tablet Take 325 mg by mouth 3 (three) times daily with meals.    fexofenadine  (ALLEGRA ) 180 MG tablet Take 180 mg by mouth daily.    gabapentin  (NEURONTIN ) 100 MG capsule TAKE 2 CAPSULE BY MOUTH TWICE DAILY    glucose blood (ACCU-CHEK GUIDE TEST) test strip Use as instructed    glucose blood (ACCU-CHEK GUIDE TEST) test strip Use as instructed    levothyroxine  (SYNTHROID ) 88 MCG tablet TAKE 1 TABLET EVERY DAY ON EMPTY  STOMACHWITH A GLASS OF WATER AT LEAST 30-60 MINBEFORE BREAKFAST    lidocaine  (LIDODERM ) 5 % Place 1 patch onto the skin daily. Remove & Discard patch within 12 hours or as directed by MDApply to left hip Apply to intact skin to cover the most painful area. Apply the prescribed number of patches (maximum of 3), for up to 12 hours within a 24 hour period.    metoprolol  tartrate (LOPRESSOR ) 50 MG tablet Take 1 tablet (50 mg total) by mouth 2 (two) times daily.    midodrine  (PROAMATINE ) 5 MG tablet TAKE 1 TABLET BY MOUTH THREE TIMES DAILYWITH MEALS (DO NOT HOLD FOR DIALYSIS,GIVE DOSE DURING DAYLIGHT HOURSONLY)    nitroGLYCERIN  (NITROSTAT ) 0.4 MG SL tablet Place 1 tablet (0.4 mg total) under the tongue every 5 (five) minutes as needed for chest pain. 05/08/2024: prn   ondansetron  (ZOFRAN -ODT) 4 MG disintegrating tablet Take 1 tablet (4 mg total) by mouth every 8 (eight) hours as needed for nausea or vomiting.    pravastatin  (PRAVACHOL ) 20 MG tablet Take 20 mg by mouth daily.    spironolactone  (ALDACTONE ) 25 MG tablet Take 25 mg by mouth daily.    venlafaxine  XR (EFFEXOR -XR) 75 MG 24 hr capsule TAKE 1 CAPSULE BY MOUTH ONCE DAILY    vitamin C  (ASCORBIC ACID ) 250 MG tablet Take 500 mg  by mouth daily.    Wound Dressings ([employer] MANUKA HONEY WOUND) GEL Apply 1 application  topically 2 (two) times daily. Apply to wound on right second toe and left medial foot. Both <1 cm in size.    [DISCONTINUED] benzonatate  (TESSALON ) 100 MG capsule Take 1 capsule (100 mg total) by mouth 2 (two) times daily as needed for cough.    pregabalin  (LYRICA ) 25 MG capsule Take 1 capsule (25mg ) nightly for 3-5 days. If tolerating well can take two capsules (50mg ) nightly for neuropathy.    No facility-administered encounter medications on file as of 01/29/2025.   Allergies[1]  Social History   Socioeconomic History   Marital status: Widowed    Spouse name: Not on file   Number of children: 5   Years of education: Not  on file   Highest education level: Master's degree (e.g., MA, MS, MEng, MEd, MSW, MBA)  Occupational History   Not on file  Tobacco Use   Smoking status: Never   Smokeless tobacco: Never  Vaping Use   Vaping status: Never Used  Substance and Sexual Activity   Alcohol use: Not Currently   Drug use: Never   Sexual activity: Not Currently  Other Topics Concern   Not on file  Social History Narrative   Not on file   Social Drivers of Health   Tobacco Use: Low Risk (01/29/2025)   Patient History    Smoking Tobacco Use: Never    Smokeless Tobacco Use: Never    Passive Exposure: Not on file  Financial Resource Strain: Low Risk  (12/01/2024)   Received from [facility]   Overall Financial Resource Strain (CARDIA)    Difficulty of Paying Living Expenses: Not hard at all  Food Insecurity: No Food Insecurity (12/01/2024)   Received from [facility]   Epic    Within the past 12 months, you worried that your food would run out before you got the money to buy more.: Never true    Within the past 12 months, the food you bought just didn't last and you didn't have money to get more.: Never true  Transportation Needs: No Transportation Needs (12/01/2024)   Received from [facility] - Transportation    In the past 12 months, has lack of transportation kept you from medical appointments or from getting medications?: No    Lack of Transportation (Non-Medical): No  Physical Activity: Sufficiently Active (10/29/2024)   Exercise Vital Sign    Days of Exercise per Week: 7 days    Minutes of Exercise per Session: 30 min  Stress: No Stress Concern Present (10/29/2024)   [employer] - Occupational Stress Questionnaire    Feeling of Stress: Only a little  Social Connections: Unknown (10/29/2024)   Social Connection and Isolation Panel    Frequency of Communication with Friends and Family:  More than three times a week    Frequency of Social Gatherings with Friends and Family: More than three times a week    Attends Religious Services: Patient declined    Database Administrator or Organizations: Patient declined    Attends Banker Meetings: Not on file    Marital Status: Widowed  Depression (PHQ2-9): Low Risk (05/05/2024)   Depression (PHQ2-9)    PHQ-2 Score: 0  Alcohol Screen: Low Risk (04/09/2023)   Alcohol Screen    Last Alcohol Screening Score (AUDIT): 0  Housing: Low Risk  (12/01/2024)  Received from [facility]   Epic    In the last 12 months, was there a time when you were not able to pay the mortgage or rent on time?: No    In the past 12 months, how many times have you moved where you were living?: 0    At any time in the past 12 months, were you homeless or living in a shelter (including now)?: No  Utilities: Not At Risk (12/01/2024)   Received from [facility]   Epic    In the past 12 months has the electric, gas, oil, or water company threatened to shut off services in your home?: No  Health Literacy: Not on file   Family History  Problem Relation Age of Onset   Hypertension Mother    Heart disease Father    CAD Father    Heart disease Sister    Diabetes Sister    Diabetes Paternal Uncle    Sleep apnea Son      Review of Systems  Per HPI unless specifically indicated above     Objective:    BP 113/66 (BP Location: Left Arm, Patient Position: Sitting, Cuff Size: Large)   Pulse 98   Temp (!) 97.4 F (36.3 C) (Oral)   Resp 18   Ht 5' 5.98 (1.676 m)   Wt 164 lb (74.4 kg)   SpO2 95%   BMI 26.48 kg/m   Wt Readings from Last 3 Encounters:  01/29/25 164 lb (74.4 kg)  01/15/25 156 lb (70.8 kg)  01/07/25 156 lb (70.8 kg)    Physical Exam  Results for orders placed or performed during the hospital encounter of 01/07/25  Resp panel by RT-PCR (RSV, Flu A&B, Covid) Anterior Nasal  Swab   Collection Time: 01/07/25 12:33 PM   Specimen: Anterior Nasal Swab  Result Value Ref Range   SARS Coronavirus 2 by RT PCR NEGATIVE NEGATIVE   Influenza A by PCR NEGATIVE NEGATIVE   Influenza B by PCR NEGATIVE NEGATIVE   Resp Syncytial Virus by PCR NEGATIVE NEGATIVE  Basic metabolic panel   Collection Time: 01/07/25 12:33 PM  Result Value Ref Range   Sodium 137 135 - 145 mmol/L   Potassium 4.3 3.5 - 5.1 mmol/L   Chloride 104 98 - 111 mmol/L   CO2 22 22 - 32 mmol/L   Glucose, Bld 255 (H) 70 - 99 mg/dL   BUN 16 8 - 23 mg/dL   Creatinine, Ser 9.31 0.44 - 1.00 mg/dL   Calcium  9.6 8.9 - 10.3 mg/dL   GFR, Estimated >39 >39 mL/min   Anion gap 12 5 - 15  CBC   Collection Time: 01/07/25 12:33 PM  Result Value Ref Range   WBC 8.2 4.0 - 10.5 K/uL   RBC 4.36 3.87 - 5.11 MIL/uL   Hemoglobin 13.9 12.0 - 15.0 g/dL   HCT 59.4 63.9 - 53.9 %   MCV 92.9 80.0 - 100.0 fL   MCH 31.9 26.0 - 34.0 pg   MCHC 34.3 30.0 - 36.0 g/dL   RDW 87.1 88.4 - 84.4 %   Platelets 263 150 - 400 K/uL   nRBC 0.0 0.0 - 0.2 %  Pro Brain natriuretic peptide   Collection Time: 01/07/25 12:33 PM  Result Value Ref Range   Pro Brain Natriuretic Peptide 931.0 (H) <300.0 pg/mL  Troponin T, High Sensitivity   Collection Time: 01/07/25 12:33 PM  Result Value Ref Range   Troponin T High Sensitivity 18  0 - 19 ng/L      Assessment & Plan:   Problem List Items Addressed This Visit       Cardiovascular and Mediastinum   Hypertension associated with diabetes (HCC)   Relevant Medications   pravastatin  (PRAVACHOL ) 20 MG tablet   spironolactone  (ALDACTONE ) 25 MG tablet     Endocrine   Hypothyroidism   Type 2 diabetes mellitus with peripheral neuropathy (HCC)   Relevant Medications   pravastatin  (PRAVACHOL ) 20 MG tablet   Other Relevant Orders   Bayer DCA Hb A1c Waived   Hyperlipidemia associated with type 2 diabetes mellitus (HCC)   Relevant Medications   pravastatin  (PRAVACHOL ) 20 MG tablet   spironolactone   (ALDACTONE ) 25 MG tablet     Other   Vitamin B12 deficiency   Depression, major, recurrent, moderate (HCC)   Other Visit Diagnoses       Encounter for annual wellness visit (AWV) in Medicare patient    -  Primary   Preventative care discussed today.        Follow up plan: Return in about 3 months (around 04/28/2025) for physical.           [1] Allergies Allergen Reactions   Pantoprazole  Nausea And Vomiting   Metformin And Related Diarrhea  "

## 2025-01-29 NOTE — Patient Instructions (Signed)
 Alison Richardson,  Thank you for taking the time for your Medicare Wellness Visit. I appreciate your continued commitment to your health goals. Please review the care plan we discussed, and feel free to reach out if I can assist you further.  Please note that Annual Wellness Visits do not include a physical exam. Some assessments may be limited, especially if the visit was conducted virtually. If needed, we may recommend an in-person follow-up with your provider.  Ongoing Care Seeing your primary care provider every 3 to 6 months helps us  monitor your health and provide consistent, personalized care.   Referrals If a referral was made during today's visit and you haven't received any updates within two weeks, please contact the referred provider directly to check on the status.  Recommended Screenings:  Health Maintenance  Topic Date Due   Medicare Annual Wellness Visit  04/08/2024   COVID-19 Vaccine (8 - 2025-26 season) 01/31/2025*   Flu Shot  03/23/2025*   Zoster (Shingles) Vaccine (1 of 2) 04/15/2025*   Hemoglobin A1C  04/13/2025   Eye exam for diabetics  11/04/2025   Complete foot exam   01/29/2026   DTaP/Tdap/Td vaccine (3 - Tdap) 09/30/2029   Pneumococcal Vaccine for age over 7  Completed   Osteoporosis screening with Bone Density Scan  Completed   Meningitis B Vaccine  Aged Out  *Topic was postponed. The date shown is not the original due date.       01/29/2025    3:10 PM  Advanced Directives  Does Patient Have a Medical Advance Directive? Yes  Type of Advance Directive Living will;Healthcare Power of Attorney  Does patient want to make changes to medical advance directive? No - Patient declined  Copy of Healthcare Power of Attorney in Chart? No - copy requested    Vision: Annual vision screenings are recommended for early detection of glaucoma, cataracts, and diabetic retinopathy. These exams can also reveal signs of chronic conditions such as diabetes and high blood  pressure.  Dental: Annual dental screenings help detect early signs of oral cancer, gum disease, and other conditions linked to overall health, including heart disease and diabetes.  Please see the attached documents for additional preventive care recommendations.

## 2025-01-29 NOTE — Progress Notes (Unsigned)
 "  Chief Complaint  Patient presents with   Transitions Of Care   Diabetes   Hypertension   Hyperlipidemia   Other    Skinning on the face would like help with that and as well as pain management      Subjective:   Alison Richardson is a 89 y.o. female who presents for a Medicare Annual Wellness Visit.  Visit info / Clinical Intake: Medicare Wellness Visit Type:: Subsequent Annual Wellness Visit Persons participating in visit and providing information:: patient Medicare Wellness Visit Mode:: In-person (required for WTM) Interpreter Needed?: No Pre-visit prep was completed: yes AWV questionnaire completed by patient prior to visit?: no Living arrangements:: in retirement community Patient's Overall Health Status Rating: (!) fair Typical amount of pain: some Does pain affect daily life?: (!) yes (when in pain she has to stay home and limit activities) Are you currently prescribed opioids?: no  Dietary Habits and Nutritional Risks How many meals a day?: 3 Eats fruit and vegetables daily?: yes Most meals are obtained by: preparing own meals; having others provide food In the last 2 weeks, have you had any of the following?: none Diabetic:: (!) yes Any non-healing wounds?: (!) yes (lower lip continues to heal and peel believes its from an infection that she had years ago) How often do you check your BS?: as needed (has trouble using the machine does it maybe once a week) Would you like to be referred to a Nutritionist or for Diabetic Management? : no  Functional Status Activities of Daily Living (to include ambulation/medication): (!) Needs Assist Feeding: Independent Dressing/Grooming: Needs assistance Bathing: Needs assistance Toileting: Independent Transfer: Independent Ambulation: Independent Medication Administration: Independent Home Management (perform basic housework or laundry): Needs assistance (comment) Manage your own finances?: yes Primary transportation is:  facility / other Concerns about vision?: no *vision screening is required for WTM* Concerns about hearing?: no (wears hearing aids)  Fall Screening Falls in the past year?: 1 Number of falls in past year: 1 Was there an injury with Fall?: 1 (broken femur and knee cap) Fall Risk Category Calculator: 3 Patient Fall Risk Level: High Fall Risk  Fall Risk Patient at Risk for Falls Due to: History of fall(s) Fall risk Follow up: Falls evaluation completed  Home and Transportation Safety: All rugs have non-skid backing?: yes All stairs or steps have railings?: yes (she does not use the stairs) Grab bars in the bathtub or shower?: yes Have non-skid surface in bathtub or shower?: yes Good home lighting?: yes Regular seat belt use?: yes Hospital stays in the last year:: (!) yes How many hospital stays:: 2 Reason: heart surgery and knee  Cognitive Assessment Difficulty concentrating, remembering, or making decisions? : yes Was the patient able to repeat memory words in 3 tries?: yes Which version was used?: Version1 : banana, sunrise, chair  Advance Directives (For Healthcare) Does Patient Have a Medical Advance Directive?: Yes Does patient want to make changes to medical advance directive?: No - Patient declined Type of Advance Directive: Living will; Healthcare Power of Attorney Copy of Healthcare Power of Attorney in Chart?: No - copy requested Copy of Living Will in Chart?: No - copy requested Out of facility DNR (pink MOST or yellow form) in Chart? (Ambulatory ONLY): Yes - validated most recent copy scanned in chart Would patient like information on creating a medical advance directive?: No - Patient declined  Reviewed/Updated  Reviewed/Updated: Reviewed All (Medical, Surgical, Family, Medications, Allergies, Care Teams, Patient Goals); Medical History; Surgical History; Family  History; Medications; Allergies; Care Teams; Patient Goals    Allergies (verified) Pantoprazole  and  Metformin and related   Current Medications (verified) Outpatient Encounter Medications as of 01/29/2025  Medication Sig   Accu-Chek Softclix Lancets lancets SMARTSIG:Lancet Topical   albuterol  (PROVENTIL ) (2.5 MG/3ML) 0.083% nebulizer solution Take 3 mLs (2.5 mg total) by nebulization every 6 (six) hours as needed for wheezing or shortness of breath.   apixaban  (ELIQUIS ) 5 MG TABS tablet Take 1 tablet (5 mg total) by mouth 2 (two) times daily.   benzonatate  (TESSALON ) 100 MG capsule Take 1 capsule (100 mg total) by mouth 2 (two) times daily as needed for cough.   Blood Glucose Monitoring Suppl (ACCU-CHEK GUIDE ME) w/Device KIT 1 each by Does not apply route daily.   clopidogrel  (PLAVIX ) 75 MG tablet Take 1 tablet (75 mg total) by mouth daily with breakfast.   cyanocobalamin  1000 MCG tablet Take 1 tablet (1,000 mcg total) by mouth daily.   donepezil  (ARICEPT ) 10 MG tablet TAKE ONE TABLET (10 MG) BY MOUTH AT BEDTIME   famotidine  (PEPCID ) 20 MG tablet TAKE 1 TABLET BY MOUTH TWICE DAILY BEFORE A MEAL   feeding supplement, GLUCERNA SHAKE, (GLUCERNA SHAKE) LIQD Take 237 mLs by mouth 3 (three) times daily between meals.   ferrous sulfate 325 (65 FE) MG EC tablet Take 325 mg by mouth 3 (three) times daily with meals.   fexofenadine  (ALLEGRA ) 180 MG tablet Take 180 mg by mouth daily.   gabapentin  (NEURONTIN ) 100 MG capsule TAKE 2 CAPSULE BY MOUTH TWICE DAILY   glucose blood (ACCU-CHEK GUIDE TEST) test strip Use as instructed   glucose blood (ACCU-CHEK GUIDE TEST) test strip Use as instructed   levothyroxine  (SYNTHROID ) 88 MCG tablet TAKE 1 TABLET EVERY DAY ON EMPTY STOMACHWITH A GLASS OF WATER AT LEAST 30-60 MINBEFORE BREAKFAST   lidocaine  (LIDODERM ) 5 % Place 1 patch onto the skin daily. Remove & Discard patch within 12 hours or as directed by MDApply to left hip Apply to intact skin to cover the most painful area. Apply the prescribed number of patches (maximum of 3), for up to 12 hours within a 24  hour period.   metoprolol  tartrate (LOPRESSOR ) 50 MG tablet Take 1 tablet (50 mg total) by mouth 2 (two) times daily.   midodrine  (PROAMATINE ) 5 MG tablet TAKE 1 TABLET BY MOUTH THREE TIMES DAILYWITH MEALS (DO NOT HOLD FOR DIALYSIS,GIVE DOSE DURING DAYLIGHT HOURSONLY)   nitroGLYCERIN  (NITROSTAT ) 0.4 MG SL tablet Place 1 tablet (0.4 mg total) under the tongue every 5 (five) minutes as needed for chest pain.   ondansetron  (ZOFRAN -ODT) 4 MG disintegrating tablet Take 1 tablet (4 mg total) by mouth every 8 (eight) hours as needed for nausea or vomiting.   pravastatin  (PRAVACHOL ) 20 MG tablet Take 20 mg by mouth daily.   spironolactone  (ALDACTONE ) 25 MG tablet Take 25 mg by mouth daily.   venlafaxine  XR (EFFEXOR -XR) 75 MG 24 hr capsule TAKE 1 CAPSULE BY MOUTH ONCE DAILY   vitamin C  (ASCORBIC ACID ) 250 MG tablet Take 500 mg by mouth daily.   Wound Dressings ([employer] MANUKA HONEY WOUND) GEL Apply 1 application  topically 2 (two) times daily. Apply to wound on right second toe and left medial foot. Both <1 cm in size.   pregabalin  (LYRICA ) 25 MG capsule Take 1 capsule (25mg ) nightly for 3-5 days. If tolerating well can take two capsules (50mg ) nightly for neuropathy.   No facility-administered encounter medications on file as of 01/29/2025.  History: Past Medical History:  Diagnosis Date   Acute on chronic congestive heart failure (HCC) 10/07/2020   Arrhythmia    Atrial fibrillation and flutter (HCC) 01/06/2019   Plavix  added to eliquis  10-22 after nstemi       Last Assessment & Plan:    Marked facial hematoma post fall but ct of neck and head reportedly normal. No ha or ataxia now but is using a walker. On eliquis  and plavix      Atrial flutter (HCC) 01/2018   new onset    Basal cell carcinoma of back    Basal cell carcinoma of lip    Cerebral aneurysm    followed by [facility]   CHF (congestive heart failure) (HCC)    DDD (degenerative disc disease), lumbar    superior plate depression, L3 08/18/2014    Diabetes mellitus type II, controlled (HCC)    Diverticulosis    Dysrhythmia    Paroxysmal Supraventricular Tachycardia   Dysthymia    depression   GERD (gastroesophageal reflux disease)    History of falling 04/03/2022   History of meniscal tear    Humeral surgical neck fracture 05/16/2022   Hyperlipidemia    Hypertension    Hypokalemia 12/12/2022   Hypothyroidism    Late onset Alzheimer's disease with behavioral disturbance (HCC)    Leukocytosis 10/07/2020   Mild pulmonary hypertension (HCC)    Moderate asthma without complication 12/12/2022   Multifocal pneumonia 12/09/2022   Overactive bladder    Peripheral vascular disease    Puncture wound of forehead 06/11/2023   Seasonal allergic rhinitis    Severe sepsis (HCC) 02/07/2022   Sleep apnea    Past Surgical History:  Procedure Laterality Date   APPENDECTOMY  1946   BASAL CELL CARCINOMA EXCISION     2006 and 2009 removed from back and lip   CARDIOVASCULAR STRESS TEST  2015   nuclear cardiac stress test negative for ischemia - Dr. Roosevelt   CATARACT EXTRACTION, BILATERAL  2006   COLONOSCOPY  2014   COLONOSCOPY N/A 08/19/2020   Procedure: COLONOSCOPY;  Surgeon: Maryruth Ole DASEN, MD;  Location: [facility] ENDOSCOPY;  Service: Endoscopy;  Laterality: N/A;   CORONARY STENT INTERVENTION N/A 05/19/2024   Procedure: CORONARY STENT INTERVENTION;  Surgeon: Mady Bruckner, MD;  Location: [facility] INVASIVE CV LAB;  Service: Cardiovascular;  Laterality: N/A;   ECTOPIC PREGNANCY SURGERY  1957   ESOPHAGOGASTRODUODENOSCOPY N/A 08/19/2020   Procedure: ESOPHAGOGASTRODUODENOSCOPY (EGD);  Surgeon: Maryruth Ole DASEN, MD;  Location: [facility] ENDOSCOPY;  Service: Endoscopy;  Laterality: N/A;   INJECTION KNEE Left 05/2015   KNEE SURGERY Right    LEFT HEART CATH AND CORONARY ANGIOGRAPHY N/A 10/12/2021   Procedure: LEFT HEART CATH AND CORONARY ANGIOGRAPHY;  Surgeon: Hester Wolm PARAS, MD;  Location: [facility] INVASIVE CV LAB;  Service: Cardiovascular;   Laterality: N/A;   ORIF HIP FRACTURE Left 07/06/2024   Procedure: OPEN REDUCTION INTERNAL FIXATION HIP;  Surgeon: Maryrose Cordella Charleston, MD;  Location: [facility] ORS;  Service: Orthopedics;  Laterality: Left;  Open reduction/internal fixation with Synthes TFN-A trochanteric nail system.  Fracture table and C arm.   REPLACEMENT TOTAL KNEE Left 09/06/2016   Dr. Chesley   RIGHT/LEFT HEART CATH AND CORONARY ANGIOGRAPHY N/A 05/19/2024   Procedure: RIGHT/LEFT HEART CATH AND CORONARY ANGIOGRAPHY;  Surgeon: Mady Bruckner, MD;  Location: [facility] INVASIVE CV LAB;  Service: Cardiovascular;  Laterality: N/A;   TONSILLECTOMY AND ADENOIDECTOMY  1953   TOTAL ABDOMINAL HYSTERECTOMY  1986   DU B   Family History  Problem Relation Age of Onset   Hypertension Mother    Heart disease Father    CAD Father    Heart disease Sister    Diabetes Sister    Diabetes Paternal Uncle    Sleep apnea Son    Social History   Occupational History   Not on file  Tobacco Use   Smoking status: Never   Smokeless tobacco: Never  Vaping Use   Vaping status: Never Used  Substance and Sexual Activity   Alcohol use: Not Currently   Drug use: Never   Sexual activity: Not Currently   Tobacco Counseling Counseling given: Not Answered  SDOH Screenings   Food Insecurity: No Food Insecurity (12/01/2024)   [facility]  Housing: Low Risk  (12/01/2024)   Received from [facility]  Transportation Needs: No Transportation Needs (12/01/2024)   [facility] from [facility]  Utilities: Not At Risk (12/01/2024)   Received from [facility]  Alcohol Screen: Low Risk (04/09/2023)  Depression (PHQ2-9): Low Risk (05/05/2024)  Financial Resource Strain: Low Risk  (12/01/2024)   Received from [facility]  Physical Activity: Sufficiently Active (10/29/2024)  Social Connections: Unknown (10/29/2024)  Stress: No Stress Concern Present  (10/29/2024)  Tobacco Use: Low Risk (01/29/2025)   See flowsheets for full screening details  Depression Screen Depression Screening Exception Documentation Depression Screening Exception:: Patient refusal  PHQ 2 & 9 Depression Scale- Over the past 2 weeks, how often have you been bothered by any of the following problems? Little interest or pleasure in doing things: 0 Feeling down, depressed, or hopeless (PHQ Adolescent also includes...irritable): 0 PHQ-2 Total Score: 0 Trouble falling or staying asleep, or sleeping too much: 0 Feeling tired or having little energy: 0 Poor appetite or overeating (PHQ Adolescent also includes...weight loss): 0 Feeling bad about yourself - or that you are a failure or have let yourself or your family down: 0 Trouble concentrating on things, such as reading the newspaper or watching television (PHQ Adolescent also includes...like school work): 0 Moving or speaking so slowly that other people could have noticed. Or the opposite - being so fidgety or restless that you have been moving around a lot more than usual: 0 Thoughts that you would be better off dead, or of hurting yourself in some way: 0 PHQ-9 Total Score: 0     Goals Addressed             This Visit's Progress    Walking       Would love to be able to walk with out walker and not have any pain      Weight Loss       Would like to get down to 150              Objective:    Today's Vitals   01/29/25 1457  BP: 113/66  Pulse: 98  Resp: 18  Temp: (!) 97.4 F (36.3 C)  TempSrc: Oral  SpO2: 95%  Weight: 164 lb (74.4 kg)  Height: 5' 5.98 (1.676 m)  PainSc: 0-No pain   Body mass index is 26.48 kg/m.  Hearing/Vision screen No results found. Immunizations and Health Maintenance Health Maintenance  Topic Date Due   Medicare Annual Wellness (AWV)  04/08/2024   COVID-19 Vaccine (8 - 2025-26 season) 01/31/2025 (Originally 08/24/2024)   Influenza Vaccine  03/23/2025 (Originally  07/24/2024)   Zoster Vaccines- Shingrix (1 of 2) 04/15/2025 (Originally 03/01/1952)  HEMOGLOBIN A1C  04/13/2025   OPHTHALMOLOGY EXAM  11/04/2025   FOOT EXAM  01/29/2026   DTaP/Tdap/Td (3 - Tdap) 09/30/2029   Pneumococcal Vaccine: 50+ Years  Completed   Bone Density Scan  Completed   Meningococcal B Vaccine  Aged Out        Assessment/Plan:  This is a routine wellness examination for Alison Richardson.  Patient Care Team: Practice, [facility] as PCP - General Perla, Evalene PARAS, MD as PCP - Cardiology (Cardiology) Deanna Channing LABOR, [facility] (Pharmacist)  I have personally reviewed and noted the following in the patients chart:   Medical and social history Use of alcohol, tobacco or illicit drugs  Current medications and supplements including opioid prescriptions. Functional ability and status Nutritional status Physical activity Advanced directives List of other physicians Hospitalizations, surgeries, and ER visits in previous 12 months Vitals Screenings to include cognitive, depression, and falls Referrals and appointments  Orders Placed This Encounter  Procedures   Bayer DCA Hb A1c Waived   In addition, I have reviewed and discussed with patient certain preventive protocols, quality metrics, and best practice recommendations. A written personalized care plan for preventive services as well as general preventive health recommendations were provided to patient.   Duwaine Louder, DO   01/29/2025   Return in about 3 months (around 04/28/2025) for physical.  After Visit Summary: (In Person-Printed) AVS printed and given to the patient   "

## 2025-01-29 NOTE — Assessment & Plan Note (Signed)
 Under good control on current regimen. Continue current regimen. Continue to monitor. Call with any concerns. Refills given. Off a significant amount of medicine since she got out of the hospital. Will get her back in with cardiology.

## 2025-02-26 ENCOUNTER — Ambulatory Visit

## 2025-03-29 ENCOUNTER — Ambulatory Visit

## 2025-04-29 ENCOUNTER — Encounter: Admitting: Family Medicine

## 2025-05-10 ENCOUNTER — Ambulatory Visit: Admitting: Cardiology
